# Patient Record
Sex: Male | Born: 1960 | Race: White | Hispanic: No | Marital: Single | Smoking: Current every day smoker
Health system: Southern US, Community
[De-identification: ages and names within clinical notes are randomized; demographics above are authoritative.]

## PROBLEM LIST (undated history)

## (undated) DIAGNOSIS — F1010 Alcohol abuse, uncomplicated: Secondary | ICD-10-CM

## (undated) DIAGNOSIS — J449 Chronic obstructive pulmonary disease, unspecified: Secondary | ICD-10-CM

## (undated) DIAGNOSIS — Z9049 Acquired absence of other specified parts of digestive tract: Secondary | ICD-10-CM

## (undated) DIAGNOSIS — F17200 Nicotine dependence, unspecified, uncomplicated: Secondary | ICD-10-CM

## (undated) DIAGNOSIS — I10 Essential (primary) hypertension: Secondary | ICD-10-CM

## (undated) DIAGNOSIS — E785 Hyperlipidemia, unspecified: Secondary | ICD-10-CM

## (undated) DIAGNOSIS — E119 Type 2 diabetes mellitus without complications: Secondary | ICD-10-CM

## (undated) DIAGNOSIS — K5792 Diverticulitis of intestine, part unspecified, without perforation or abscess without bleeding: Secondary | ICD-10-CM

## (undated) DIAGNOSIS — I82409 Acute embolism and thrombosis of unspecified deep veins of unspecified lower extremity: Secondary | ICD-10-CM

## (undated) HISTORY — PX: COLOSTOMY: SHX63

## (undated) HISTORY — PX: TONSILLECTOMY: SUR1361

---

## 2009-12-25 ENCOUNTER — Emergency Department (HOSPITAL_COMMUNITY): Admission: AC | Admit: 2009-12-25 | Discharge: 2009-12-25 | Payer: Self-pay

## 2009-12-30 ENCOUNTER — Emergency Department (HOSPITAL_COMMUNITY): Admission: EM | Admit: 2009-12-30 | Discharge: 2009-12-30 | Payer: Self-pay | Admitting: Emergency Medicine

## 2010-05-25 ENCOUNTER — Encounter (INDEPENDENT_AMBULATORY_CARE_PROVIDER_SITE_OTHER): Payer: Self-pay | Admitting: Family Medicine

## 2010-05-25 LAB — CONVERTED CEMR LAB
ALT: 34 units/L (ref 0–53)
AST: 32 units/L (ref 0–37)
Albumin: 5 g/dL (ref 3.5–5.2)
Alkaline Phosphatase: 99 units/L (ref 39–117)
BUN: 16 mg/dL (ref 6–23)
CO2: 25 meq/L (ref 19–32)
Calcium: 9.7 mg/dL (ref 8.4–10.5)
Chloride: 98 meq/L (ref 96–112)
Creatinine, Ser: 0.92 mg/dL (ref 0.40–1.50)
Glucose, Bld: 82 mg/dL (ref 70–99)
Potassium: 4.7 meq/L (ref 3.5–5.3)
Sodium: 137 meq/L (ref 135–145)
Total Bilirubin: 0.8 mg/dL (ref 0.3–1.2)
Total Protein: 8.1 g/dL (ref 6.0–8.3)

## 2010-08-28 LAB — COMPREHENSIVE METABOLIC PANEL
ALT: 25 U/L (ref 0–53)
Alkaline Phosphatase: 81 U/L (ref 39–117)
CO2: 25 mEq/L (ref 19–32)
Chloride: 104 mEq/L (ref 96–112)
GFR calc Af Amer: >60 mL/min (ref >60)
GFR calc non Af Amer: >60 mL/min (ref >60)
Potassium: 2.7 mEq/L — CL (ref 3.5–5.1)
Total Bilirubin: 0.5 mg/dL (ref 0.3–1.2)

## 2010-08-28 LAB — CBC
MCH: 34.8 pg — ABNORMAL HIGH (ref 26.0–34.0)
RBC: 4 MIL/uL — ABNORMAL LOW (ref 4.22–5.81)

## 2010-08-28 LAB — PROTIME-INR: INR: 0.91 (ref 0.00–1.49)

## 2010-08-28 LAB — ETHANOL: Alcohol, Ethyl (B): 260 mg/dL — ABNORMAL HIGH (ref 0–10)

## 2017-03-28 ENCOUNTER — Ambulatory Visit: Payer: Self-pay | Admitting: Registered"

## 2019-08-02 ENCOUNTER — Inpatient Hospital Stay (HOSPITAL_COMMUNITY)
Admission: EM | Admit: 2019-08-02 | Discharge: 2019-08-04 | DRG: 372 | Disposition: A | Discharge status: Home Health | Payer: Self-pay | Attending: Internal Medicine | Admitting: Internal Medicine

## 2019-08-02 ENCOUNTER — Emergency Department (HOSPITAL_COMMUNITY): Payer: Self-pay

## 2019-08-02 ENCOUNTER — Encounter (HOSPITAL_COMMUNITY): Payer: Self-pay

## 2019-08-02 ENCOUNTER — Other Ambulatory Visit: Payer: Self-pay

## 2019-08-02 DIAGNOSIS — A419 Sepsis, unspecified organism: Secondary | ICD-10-CM | POA: Diagnosis present

## 2019-08-02 DIAGNOSIS — K651 Peritoneal abscess: Principal | ICD-10-CM | POA: Diagnosis present

## 2019-08-02 DIAGNOSIS — Z86718 Personal history of other venous thrombosis and embolism: Secondary | ICD-10-CM

## 2019-08-02 DIAGNOSIS — Z79899 Other long term (current) drug therapy: Secondary | ICD-10-CM

## 2019-08-02 DIAGNOSIS — Z932 Ileostomy status: Secondary | ICD-10-CM

## 2019-08-02 DIAGNOSIS — I959 Hypotension, unspecified: Secondary | ICD-10-CM | POA: Diagnosis present

## 2019-08-02 DIAGNOSIS — Z9049 Acquired absence of other specified parts of digestive tract: Secondary | ICD-10-CM

## 2019-08-02 DIAGNOSIS — I10 Essential (primary) hypertension: Secondary | ICD-10-CM | POA: Diagnosis present

## 2019-08-02 DIAGNOSIS — L0291 Cutaneous abscess, unspecified: Secondary | ICD-10-CM

## 2019-08-02 DIAGNOSIS — Z7984 Long term (current) use of oral hypoglycemic drugs: Secondary | ICD-10-CM

## 2019-08-02 DIAGNOSIS — K632 Fistula of intestine: Secondary | ICD-10-CM | POA: Diagnosis present

## 2019-08-02 DIAGNOSIS — Z885 Allergy status to narcotic agent status: Secondary | ICD-10-CM

## 2019-08-02 DIAGNOSIS — Z20822 Contact with and (suspected) exposure to covid-19: Secondary | ICD-10-CM | POA: Diagnosis present

## 2019-08-02 DIAGNOSIS — E119 Type 2 diabetes mellitus without complications: Secondary | ICD-10-CM

## 2019-08-02 DIAGNOSIS — Z933 Colostomy status: Secondary | ICD-10-CM

## 2019-08-02 DIAGNOSIS — Z7901 Long term (current) use of anticoagulants: Secondary | ICD-10-CM

## 2019-08-02 DIAGNOSIS — E1169 Type 2 diabetes mellitus with other specified complication: Secondary | ICD-10-CM

## 2019-08-02 HISTORY — DX: Type 2 diabetes mellitus without complications: E11.9

## 2019-08-02 HISTORY — DX: Essential (primary) hypertension: I10

## 2019-08-02 HISTORY — DX: Acute embolism and thrombosis of unspecified deep veins of unspecified lower extremity: I82.409

## 2019-08-02 LAB — CBC WITH DIFFERENTIAL/PLATELET
Abs Immature Granulocytes: 0.17 10*3/uL — ABNORMAL HIGH (ref 0.00–0.07)
Basophils Absolute: 0.1 10*3/uL (ref 0.0–0.1)
Basophils Relative: 0 %
Eosinophils Absolute: 0.1 10*3/uL (ref 0.0–0.5)
Eosinophils Relative: 1 %
HCT: 33.4 % — ABNORMAL LOW (ref 39.0–52.0)
Hemoglobin: 10.7 g/dL — ABNORMAL LOW (ref 13.0–17.0)
Immature Granulocytes: 1 %
Lymphocytes Relative: 13 %
Lymphs Abs: 2.4 10*3/uL (ref 0.7–4.0)
MCH: 29.6 pg (ref 26.0–34.0)
MCHC: 32 g/dL (ref 30.0–36.0)
MCV: 92.3 fL (ref 80.0–100.0)
Monocytes Absolute: 1.9 10*3/uL — ABNORMAL HIGH (ref 0.1–1.0)
Monocytes Relative: 10 %
Neutro Abs: 14.5 10*3/uL — ABNORMAL HIGH (ref 1.7–7.7)
Neutrophils Relative %: 75 %
Platelets: 395 10*3/uL (ref 150–400)
RBC: 3.62 MIL/uL — ABNORMAL LOW (ref 4.22–5.81)
RDW: 14.6 % (ref 11.5–15.5)
WBC: 19.1 10*3/uL — ABNORMAL HIGH (ref 4.0–10.5)
nRBC: 0 % (ref 0.0–0.2)

## 2019-08-02 LAB — COMPREHENSIVE METABOLIC PANEL
ALT: 13 U/L (ref 0–44)
AST: 14 U/L — ABNORMAL LOW (ref 15–41)
Albumin: 3.5 g/dL (ref 3.5–5.0)
Alkaline Phosphatase: 99 U/L (ref 38–126)
Anion gap: 17 — ABNORMAL HIGH (ref 5–15)
BUN: 28 mg/dL — ABNORMAL HIGH (ref 6–20)
CO2: 20 mmol/L — ABNORMAL LOW (ref 22–32)
Calcium: 9.4 mg/dL (ref 8.9–10.3)
Chloride: 98 mmol/L (ref 98–111)
Creatinine, Ser: 1.28 mg/dL — ABNORMAL HIGH (ref 0.61–1.24)
GFR calc Af Amer: >60 mL/min (ref >60)
GFR calc non Af Amer: >60 mL/min (ref >60)
Glucose, Bld: 103 mg/dL — ABNORMAL HIGH (ref 70–99)
Potassium: 3.5 mmol/L (ref 3.5–5.1)
Sodium: 135 mmol/L (ref 135–145)
Total Bilirubin: 0.7 mg/dL (ref 0.3–1.2)
Total Protein: 8 g/dL (ref 6.5–8.1)

## 2019-08-02 LAB — URINALYSIS, ROUTINE W REFLEX MICROSCOPIC
Bilirubin Urine: NEGATIVE
Glucose, UA: NEGATIVE mg/dL
Hgb urine dipstick: NEGATIVE
Ketones, ur: NEGATIVE mg/dL
Leukocytes,Ua: NEGATIVE
Nitrite: NEGATIVE
Protein, ur: NEGATIVE mg/dL
Specific Gravity, Urine: 1.024 (ref 1.005–1.030)
pH: 5 (ref 5.0–8.0)

## 2019-08-02 LAB — RESPIRATORY PANEL BY RT PCR (FLU A&B, COVID)
Influenza A by PCR: NEGATIVE
Influenza B by PCR: NEGATIVE
SARS Coronavirus 2 by RT PCR: NEGATIVE

## 2019-08-02 LAB — PROTIME-INR
INR: 1.2 (ref 0.8–1.2)
Prothrombin Time: 15.3 seconds — ABNORMAL HIGH (ref 11.4–15.2)

## 2019-08-02 LAB — APTT: aPTT: 38 seconds — ABNORMAL HIGH (ref 24–36)

## 2019-08-02 LAB — LACTIC ACID, PLASMA: Lactic Acid, Venous: 5.1 mmol/L (ref 0.5–1.9)

## 2019-08-02 MED ORDER — VANCOMYCIN HCL 2000 MG/400ML IV SOLN
2000.0000 mg | Freq: Once | INTRAVENOUS | Status: AC
  Administered 2019-08-02: 2000 mg via INTRAVENOUS
  Filled 2019-08-02: qty 400

## 2019-08-02 MED ORDER — SODIUM CHLORIDE 0.9% FLUSH
3.0000 mL | Freq: Once | INTRAVENOUS | Status: AC
  Administered 2019-08-02: 3 mL via INTRAVENOUS

## 2019-08-02 MED ORDER — PIPERACILLIN-TAZOBACTAM 3.375 G IVPB
3.3750 g | Freq: Three times a day (TID) | INTRAVENOUS | Status: DC
  Administered 2019-08-03 – 2019-08-04 (×4): 3.375 g via INTRAVENOUS
  Filled 2019-08-02 (×4): qty 50

## 2019-08-02 MED ORDER — LACTATED RINGERS IV SOLN: INTRAVENOUS | Status: DC

## 2019-08-02 MED ORDER — LACTATED RINGERS IV BOLUS
1750.0000 mL | Freq: Once | INTRAVENOUS | Status: AC
  Administered 2019-08-02: 1750 mL via INTRAVENOUS

## 2019-08-02 MED ORDER — PIPERACILLIN-TAZOBACTAM 3.375 G IVPB 30 MIN
3.3750 g | Freq: Once | INTRAVENOUS | Status: AC
  Administered 2019-08-02: 3.375 g via INTRAVENOUS
  Filled 2019-08-02: qty 50

## 2019-08-02 MED ORDER — LACTATED RINGERS IV BOLUS: 2750.0000 mL | Freq: Once | INTRAVENOUS | Status: DC

## 2019-08-02 MED ORDER — VANCOMYCIN HCL IN DEXTROSE 1-5 GM/200ML-% IV SOLN
1000.0000 mg | Freq: Two times a day (BID) | INTRAVENOUS | Status: DC
  Administered 2019-08-03 (×2): 1000 mg via INTRAVENOUS
  Filled 2019-08-02 (×4): qty 200

## 2019-08-02 MED ORDER — IOHEXOL 300 MG/ML  SOLN
100.0000 mL | Freq: Once | INTRAMUSCULAR | Status: AC | PRN
  Administered 2019-08-02: 100 mL via INTRAVENOUS

## 2019-08-02 MED ORDER — LACTATED RINGERS IV BOLUS
1000.0000 mL | Freq: Once | INTRAVENOUS | Status: AC
  Administered 2019-08-02: 1000 mL via INTRAVENOUS

## 2019-08-02 NOTE — ED Triage Notes (Signed)
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

## 2019-08-02 NOTE — ED Provider Notes (Signed)
[facility] EMERGENCY DEPARTMENT Provider Note   CSN: [id number] Arrival date & time: 08/02/19  1857     History Chief Complaint  Patient presents with  . Wound Check    Luis [name] is a 59 y.o. male.  Patient with history of diabetes, hypertension who presents to the ED with abdominal pain, purulent drainage from abdominal wound.  Patient recently has been incarcerated in [facility].  During that time he had diverticulitis with perforation and had ostomy placed.  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  He finished a course of antibiotics just prior to release from prison last week.  He does not know when his follow-up with surgeons is in place.  He lives in [place] and prefers to have doctors locally.  Over the last several days he has noticed increased purulent drainage from fistula area on his left lower abdomen.  He has been doing his own wound dressings.  He has not noticed any fevers but has had some discomfort.  He did have blood clots while he was admitted to the hospital in [place] and is on Eliquis.  The history is provided by the patient.  Abdominal Pain Pain location:  Generalized Pain quality: aching   Pain radiates to:  Does not radiate Pain severity:  Mild Onset quality:  Gradual Timing:  Constant Progression:  Worsening Chronicity:  New Context: previous surgery   Relieved by:  Nothing Worsened by:  Nothing Associated symptoms: no anorexia, no belching, no chest pain, no chills, no constipation, no cough, no dysuria, no fever, no hematuria, no nausea, no shortness of breath, no sore throat and no vomiting   Risk factors: multiple surgeries        Past Medical History:  Diagnosis Date  . Diabetes mellitus without complication (HCC)   . Hypertension     There are no problems to display for this patient.   Past Surgical History:  Procedure Laterality Date  . COLOSTOMY    .  TONSILLECTOMY         No family history on file.  Social History   Tobacco Use  . Smoking status: Not on file  Substance Use Topics  . Alcohol use: Not on file  . Drug use: Not on file    Home Medications Prior to Admission medications   Not on File    Allergies    Patient has no allergy information on record.  Review of Systems   Review of Systems  Constitutional: Negative for chills and fever.  HENT: Negative for ear pain and sore throat.   Eyes: Negative for pain and visual disturbance.  Respiratory: Negative for cough and shortness of breath.   Cardiovascular: Negative for chest pain and palpitations.  Gastrointestinal: Positive for abdominal pain. Negative for anorexia, constipation, nausea and vomiting.  Genitourinary: Negative for dysuria and hematuria.  Musculoskeletal: Negative for arthralgias and back pain.  Skin: Positive for wound. Negative for color change and rash.  Neurological: Negative for seizures and syncope.  All other systems reviewed and are negative.   Physical Exam Updated Vital Signs  ED Triage Vitals  Enc Vitals Group     BP 08/02/19 1901 (!) 87/53     Pulse Rate 08/02/19 1901 (!) 58     Resp 08/02/19 1901 18     Temp 08/02/19 1901 98 F (36.7 C)     Temp Source 08/02/19 1901 Oral     SpO2 08/02/19 1901 97 %  Weight 08/02/19 2101 195 lb (88.5 kg)     Height 08/02/19 2101 6\' 1"  (1.854 m)     Head Circumference --      Peak Flow --      Pain Score 08/02/19 1906 10     Pain Loc --      Pain Edu? --      Excl. in [place]? --     Physical Exam Vitals and nursing note reviewed.  Constitutional:      General: He is not in acute distress.    Appearance: He is well-developed. He is not ill-appearing.  HENT:     Head: Normocephalic and atraumatic.     Nose: Nose normal.     Mouth/Throat:     Mouth: Mucous membranes are moist.  Eyes:     Extraocular Movements: Extraocular movements intact.     Conjunctiva/sclera: Conjunctivae  normal.     Pupils: Pupils are equal, round, and reactive to light.  Cardiovascular:     Rate and Rhythm: Normal rate and regular rhythm.     Heart sounds: No murmur.  Pulmonary:     Effort: Pulmonary effort is normal. No respiratory distress.     Breath sounds: Normal breath sounds.  Abdominal:     Palpations: Abdomen is soft.     Tenderness: There is abdominal tenderness.     Comments: Central abdominal surgical scar with granulation tissue, left lower quadrant area of packing with purulent drainage from hole, right-sided ostomy with brown stool  Musculoskeletal:        General: Normal range of motion.     Cervical back: Normal range of motion and neck supple.  Skin:    General: Skin is warm and dry.     Capillary Refill: Capillary refill takes less than 2 seconds.  Neurological:     General: No focal deficit present.     Mental Status: He is alert.  Psychiatric:        Mood and Affect: Mood normal.     ED Results / Procedures / Treatments   Labs (all labs ordered are listed, but only abnormal results are displayed) Labs Reviewed  LACTIC ACID, PLASMA - Abnormal; Notable for the following components:      Result Value   Lactic Acid, Venous 5.1 (*)    All other components within normal limits  COMPREHENSIVE METABOLIC PANEL - Abnormal; Notable for the following components:   CO2 20 (*)    Glucose, Bld 103 (*)    BUN 28 (*)    Creatinine, Ser 1.28 (*)    AST 14 (*)    Anion gap 17 (*)    All other components within normal limits  CBC WITH DIFFERENTIAL/PLATELET - Abnormal; Notable for the following components:   WBC 19.1 (*)    RBC 3.62 (*)    Hemoglobin 10.7 (*)    HCT 33.4 (*)    Neutro Abs 14.5 (*)    Monocytes Absolute 1.9 (*)    Abs Immature Granulocytes 0.17 (*)    All other components within normal limits  RESPIRATORY PANEL BY RT PCR (FLU A&B, COVID)  CULTURE, BLOOD (ROUTINE X 2)  CULTURE, BLOOD (ROUTINE X 2)  URINE CULTURE  URINALYSIS, ROUTINE W REFLEX  MICROSCOPIC  LACTIC ACID, PLASMA  APTT  PROTIME-INR    EKG None  Radiology CT ABDOMEN PELVIS W CONTRAST  Result Date: 08/02/2019 CLINICAL DATA:  Abdominal abscess suspected. Recent abdominal surgery. EXAM: CT ABDOMEN AND PELVIS WITH CONTRAST TECHNIQUE: Multidetector CT  imaging of the abdomen and pelvis was performed using the standard protocol following bolus administration of intravenous contrast. CONTRAST:  OMNIPAQUE IOHEXOL 300 MG/ML  SOLN COMPARISON:  December 25, 2009.  January 22, 2019. FINDINGS: Lower chest: The lung bases are clear. The heart size is normal. Hepatobiliary: The liver is normal. Normal gallbladder.There is no biliary ductal dilation. Pancreas: Normal contours without ductal dilatation. No peripancreatic fluid collection. Spleen: No splenic laceration or hematoma. Adrenals/Urinary Tract: --Adrenal glands: No adrenal hemorrhage. --Right kidney/ureter: No hydronephrosis or perinephric hematoma. --Left kidney/ureter: No hydronephrosis or perinephric hematoma. --Urinary bladder: Unremarkable. Stomach/Bowel: --Stomach/Duodenum: No hiatal hernia or other gastric abnormality. Normal duodenal course and caliber. --Small bowel: There is no evidence for small bowel obstruction. There are multiple enteroperitoneal adhesions to the anterior abdominal wall. 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 --Colon: The patient is status post total colectomy. --Appendix: Surgically absent. Vascular/Lymphatic: Atherosclerotic calcification is present within the non-aneurysmal abdominal aorta, without hemodynamically significant stenosis. --No retroperitoneal lymphadenopathy. --No mesenteric lymphadenopathy. --No pelvic or inguinal lymphadenopathy. Reproductive: Unremarkable Other: No ascites or free air. Th there is a curvilinear abscess in the left hemiabdomen measuring up to approximately 4.1 by 1.3 cm. This abscess extends inferiorly towards the left pericolic gutter and superiorly to terminate below  the greater curvature of the stomach. In the left upper quadrant. There is significant fat stranding with likely areas of fat necrosis. Musculoskeletal. No acute displaced fractures. IMPRESSION: 1. There is a curvilinear at least 4.5 cm abscess in the left abdomen as detailed above. 2. The patient is status post total colectomy. There is fat stranding in the left upper quadrant, likely postsurgical in etiology with areas of developing fat necrosis. 3. Patent right lower quadrant ileostomy without evidence for a bowel obstruction. Electronically Signed   By: Katherine Mantle M.D.   On: 08/02/2019 22:18   DG Chest Port 1 View  Result Date: 08/02/2019 CLINICAL DATA:  Sepsis. EXAM: PORTABLE CHEST 1 VIEW COMPARISON:  December 25, 2009 FINDINGS: The heart size and mediastinal contours are within normal limits. Both lungs are clear. The visualized skeletal structures are unremarkable. Aortic calcifications are noted. IMPRESSION: No active disease. Electronically Signed   By: Katherine Mantle M.D.   On: 08/02/2019 21:16    Procedures .Critical Care Performed by: Virgina Norfolk, DO Authorized by: Virgina Norfolk, DO   Critical care provider statement:    Critical care time (minutes):  45   Critical care was necessary to treat or prevent imminent or life-threatening deterioration of the following conditions:  Sepsis   Critical care was time spent personally by me on the following activities:  Blood draw for specimens, development of treatment plan with patient or surrogate, discussions with consultants, discussions with primary provider, evaluation of patient's response to treatment, examination of patient, obtaining history from patient or surrogate, ordering and performing treatments and interventions, ordering and review of laboratory studies, ordering and review of radiographic studies, pulse oximetry, re-evaluation of patient's condition and review of old charts   I assumed direction of critical care for  this patient from another provider in my specialty: no     (including critical care time)  Medications Ordered in ED Medications  vancomycin (VANCOREADY) IVPB 2000 mg/400 mL (2,000 mg Intravenous New Bag/Given 08/02/19 2223)  vancomycin (VANCOCIN) IVPB 1000 mg/200 mL premix (has no administration in time range)  piperacillin-tazobactam (ZOSYN) IVPB 3.375 g (has no administration in time range)  lactated ringers infusion ( Intravenous New Bag/Given 08/02/19 2249)  sodium chloride  flush (NS) 0.9 % injection 3 mL (3 mLs Intravenous Given 08/02/19 2051)  lactated ringers bolus 1,000 mL (0 mLs Intravenous Stopped 08/02/19 2224)  lactated ringers bolus 1,750 mL (0 mLs Intravenous Stopped 08/02/19 2244)  piperacillin-tazobactam (ZOSYN) IVPB 3.375 g (0 g Intravenous Stopped 08/02/19 2156)  iohexol (OMNIPAQUE) 300 MG/ML solution 100 mL (100 mLs Intravenous Contrast Given 08/02/19 2155)    ED Course  I have reviewed the triage vital signs and the nursing notes.  Pertinent labs & imaging results that were available during my care of the patient were reviewed by me and considered in my medical decision making (see chart for details).    MDM Rules/Calculators/A&P                      Luis [name] is a 59 year old male with history of hypertension, diabetes, status post colectomy following perforated diverticulitis last July who presents to the ED with abdominal pain, drainage from chronic left lower quadrant abdominal wound.  Patient with hypotension upon arrival and tachycardia.  However no fever.  Patient had multiple surgeries and extensive care in [facility] while he was incarcerated.  However he was released from prison last week and now lives in [place].  Does not have insurance or access to medical care.  He does not plan on following up with his surgeons at this time.  Patient noticed increasing drainage from his lower abdominal wound area.  It was foul-smelling.  Patient in triage  had initial blood pressure of 87/53 but otherwise had normal vitals.  Blood pressure was rechecked 8 minutes later and patient was 93/65 and therefore was not bedded right away.  He had basic lab work sent.  CBC was found to have a white count of 19 and patient a lactic acid of 5.1.  Therefore patient was reevaluated in triage and found to have a blood pressure of 83/65 and was tachycardic.  At that time he was brought back to the emergency department and was made a code sepsis.  Patient was empirically given IV antibiotics.  Sepsis labs were ordered and patient was given 30 cc/kg IV fluids.  CT scan of the abdomen and pelvis also has been ordered.  Patient on reassessment has normal blood pressure.  Good capillary refill.  Improved tachycardia.  It appears that fluid resuscitation has helped patient.  No signs of shock.  Urinalysis negative for infection.  Covid test is negative.  Influenza test is negative.  CT scan shows a 4.5 cm abscess in the left abdomen.  Otherwise unremarkable imaging.  Talked with Dr. [name] with general surgery who will evaluate the patient but believes that this will be amenable to IR drainage or aspiration.  Recommends admission to medicine as there is no acute surgical need at this time.    Patient admitted to medicine in stable condition.   Final Clinical Impression(s) / ED Diagnoses Final diagnoses:  Intra-abdominal abscess (HCC)  Sepsis, due to unspecified organism, unspecified whether acute organ dysfunction present [facility])    Rx / DC Orders ED Discharge Orders    None       Virgina Norfolk, DO 08/02/19 2254

## 2019-08-02 NOTE — Progress Notes (Signed)
Pharmacy Antibiotic Note  Luis [name] is a 59 y.o. male admitted on 08/02/2019 with sepsis, abdominal wound possibly fistula (completed 10d tx with augmentin a week ago).  Pharmacy has been consulted for vancomycin and zosyn dosing.  WBC 19.1, LA 5.1, AF, BP soft  Plan: Vancomycin 2000 mg IV x 1, then 1000 mg IV every 12 hours Goal AUC 400-550. Expected AUC: 500 SCr used: 1.28 Zosyn 3.375g IV every 8 hours (extended infusion) Monitor renal function, Cx and clinical progression to narrow Vancomycin levels at steady state  Weight: 195 lb (88.5 kg)  Temp (24hrs), Avg:98.3 F (36.8 C), Min:98 F (36.7 C), Max:98.5 F (36.9 C)  Recent Labs  Lab 08/02/19 1920  WBC 19.1*  CREATININE 1.28*  LATICACIDVEN 5.1*    CrCl cannot be calculated (Unknown ideal weight.).    Not on File  Antimicrobials this admission: Vanc 2/19>> Zosyn 2/19>>  Dose adjustments this admission:   Microbiology results: 2/19 BCx: sent 2/19 UCx: sent  Daylene Posey, PharmD Clinical Pharmacist Please check AMION for all [facility] Pharmacy numbers 08/02/2019 9:07 PM

## 2019-08-02 NOTE — Consult Note (Signed)
Reason for Consult:ab pain, drainage Referring Physician:Dr Curatolo  Luis [name] is an 59 y.o. male.  HPI: 27 yom with history of dm and htn who apparently was in prison in [place], [place]. His records are apparently here but they are out of room right now.  It appears he had TAC with ileostomy in July 2020.  He has had multiple wound infections and has recently been in hospital at [facility] three weeks ago for the same thing.  He is able to eat. His stoma is functional. He has no fevers. He has drainage from left side of abdomen that is brown and foul smelling.  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  He is a smoker.   He came in today for the drainage and concern due to that. hye had a ct scan that showed a left abdominal abscess   Past Medical History:  Diagnosis Date  . Diabetes mellitus without complication (HCC)   . Hypertension     Past Surgical History:  Procedure Laterality Date  . COLOSTOMY    . TONSILLECTOMY      No family history on file.  Social History:  has no history on file for tobacco, alcohol, and drug.  Allergies:  Allergies  Allergen Reactions  . Codeine Other (See Comments)    unknown    Medications: I have reviewed the patient's current medications.  Results for orders placed or performed during the hospital encounter of 08/02/19 (from the past 48 hour(s))  Lactic acid, plasma     Status: Abnormal   Collection Time: 08/02/19  7:20 PM  Result Value Ref Range   Lactic Acid, Venous 5.1 (HH) 0.5 - 1.9 mmol/L    Comment: CRITICAL RESULT CALLED TO, READ BACK BY AND VERIFIED WITH: Riki Altes RN AT 2010 ON [id number] BY Marveen Reeks Performed at [facility], [address] [place], [place] [address]   Comprehensive metabolic panel     Status: Abnormal   Collection Time: 08/02/19  7:20 PM  Result Value Ref Range   Sodium 135 135 - 145 mmol/L   Potassium 3.5 3.5 - 5.1 mmol/L   Chloride 98 98 - 111 mmol/L   CO2  20 (L) 22 - 32 mmol/L   Glucose, Bld 103 (H) 70 - 99 mg/dL   BUN 28 (H) 6 - 20 mg/dL   Creatinine, Ser 2.09 (H) 0.61 - 1.24 mg/dL   Calcium 9.4 8.9 - 47.0 mg/dL   Total Protein 8.0 6.5 - 8.1 g/dL   Albumin 3.5 3.5 - 5.0 g/dL   AST 14 (L) 15 - 41 U/L   ALT 13 0 - 44 U/L   Alkaline Phosphatase 99 38 - 126 U/L   Total Bilirubin 0.7 0.3 - 1.2 mg/dL   GFR calc non Af Amer >60 >60 mL/min   GFR calc Af Amer >60 >60 mL/min   Anion gap 17 (H) 5 - 15    Comment: Performed at [facility], [address] [place], [place] [address]  CBC with Differential     Status: Abnormal   Collection Time: 08/02/19  7:20 PM  Result Value Ref Range   WBC 19.1 (H) 4.0 - 10.5 K/uL   RBC 3.62 (L) 4.22 - 5.81 MIL/uL   Hemoglobin 10.7 (L) 13.0 - 17.0 g/dL   HCT 66.2 (L) 94.7 - 65.4 %   MCV 92.3 80.0 - 100.0 fL   MCH 29.6 26.0 -  34.0 pg   MCHC 32.0 30.0 - 36.0 g/dL   RDW 14.6 11.5 - 15.5 %   Platelets 395 150 - 400 K/uL   nRBC 0.0 0.0 - 0.2 %   Neutrophils Relative % 75 %   Neutro Abs 14.5 (H) 1.7 - 7.7 K/uL   Lymphocytes Relative 13 %   Lymphs Abs 2.4 0.7 - 4.0 K/uL   Monocytes Relative 10 %   Monocytes Absolute 1.9 (H) 0.1 - 1.0 K/uL   Eosinophils Relative 1 %   Eosinophils Absolute 0.1 0.0 - 0.5 K/uL   Basophils Relative 0 %   Basophils Absolute 0.1 0.0 - 0.1 K/uL   Immature Granulocytes 1 %   Abs Immature Granulocytes 0.17 (H) 0.00 - 0.07 K/uL    Comment: Performed at [facility] [address], [place], [place] [address]  Urinalysis, Routine w reflex microscopic     Status: None   Collection Time: 08/02/19  8:09 PM  Result Value Ref Range   Color, Urine YELLOW YELLOW   APPearance CLEAR CLEAR   Specific Gravity, Urine 1.024 1.005 - 1.030   pH 5.0 5.0 - 8.0   Glucose, UA NEGATIVE NEGATIVE mg/dL   Hgb urine dipstick NEGATIVE NEGATIVE   Bilirubin Urine NEGATIVE NEGATIVE   Ketones, ur NEGATIVE NEGATIVE mg/dL   Protein, ur NEGATIVE NEGATIVE mg/dL   Nitrite NEGATIVE NEGATIVE    Leukocytes,Ua NEGATIVE NEGATIVE    Comment: Performed at [facility] [address], [place], [place] [address]  APTT     Status: Abnormal   Collection Time: 08/02/19  9:13 PM  Result Value Ref Range   aPTT 38 (H) 24 - 36 seconds    Comment:        IF BASELINE aPTT IS ELEVATED, SUGGEST PATIENT RISK ASSESSMENT BE USED TO DETERMINE APPROPRIATE ANTICOAGULANT THERAPY. Performed at [facility], [place] [address], [place], [place] [address]   Protime-INR     Status: Abnormal   Collection Time: 08/02/19  9:13 PM  Result Value Ref Range   Prothrombin Time 15.3 (H) 11.4 - 15.2 seconds   INR 1.2 0.8 - 1.2    Comment: (NOTE) INR goal varies based on device and disease states. Performed at [facility], [place] [address] [place], [place] [address]   Respiratory Panel by RT PCR (Flu A&B, Covid) - Nasopharyngeal Swab     Status: None   Collection Time: 08/02/19  9:14 PM   Specimen: Nasopharyngeal Swab  Result Value Ref Range   SARS Coronavirus 2 by RT PCR NEGATIVE NEGATIVE    Comment: (NOTE) SARS-CoV-2 target nucleic acids are NOT DETECTED. The SARS-CoV-2 RNA is generally detectable in upper respiratoy specimens during the acute phase of infection. The lowest concentration of SARS-CoV-2 viral copies this assay can detect is 131 copies/mL. A negative result does not preclude SARS-Cov-2 infection and should not be used as the sole basis for treatment or other patient management decisions. A negative result may occur with  improper specimen collection/handling, submission of specimen other than nasopharyngeal swab, presence of viral mutation(s) within the areas targeted by this assay, and inadequate number of viral copies (<131 copies/mL). A negative result must be combined with clinical observations, patient history, and epidemiological information. The expected result is Negative. Fact Sheet for Patients:  PinkCheek.be Fact Sheet for  Healthcare Providers:  GravelBags.it This test is not yet ap proved or cleared by the [place] FDA and  has been authorized for detection and/or  diagnosis of SARS-CoV-2 by FDA under an Emergency Use Authorization (EUA). This EUA will remain  in effect (meaning this test can be used) for the duration of the COVID-19 declaration under Section 564(b)(1) of the Act, 21 U.S.C. section 360bbb-3(b)(1), unless the authorization is terminated or revoked sooner.    Influenza A by PCR NEGATIVE NEGATIVE   Influenza B by PCR NEGATIVE NEGATIVE    Comment: (NOTE) The Xpert Xpress SARS-CoV-2/FLU/RSV assay is intended as an aid in  the diagnosis of influenza from Nasopharyngeal swab specimens and  should not be used as a sole basis for treatment. Nasal washings and  aspirates are unacceptable for Xpert Xpress SARS-CoV-2/FLU/RSV  testing. Fact Sheet for Patients: https://www.moore.com/ Fact Sheet for Healthcare Providers: https://www.young.biz/ This test is not yet approved or cleared by the [place] FDA and  has been authorized for detection and/or diagnosis of SARS-CoV-2 by  FDA under an Emergency Use Authorization (EUA). This EUA will remain  in effect (meaning this test can be used) for the duration of the  Covid-19 declaration under Section 564(b)(1) of the Act, 21  U.S.C. section 360bbb-3(b)(1), unless the authorization is  terminated or revoked. Performed at [facility], [address], [place], [place] [address]     CT ABDOMEN PELVIS W CONTRAST  Result Date: 08/02/2019 CLINICAL DATA:  Abdominal abscess suspected. Recent abdominal surgery. EXAM: CT ABDOMEN AND PELVIS WITH CONTRAST TECHNIQUE: Multidetector CT imaging of the abdomen and pelvis was performed using the standard protocol following bolus administration of intravenous contrast. CONTRAST:  OMNIPAQUE IOHEXOL 300 MG/ML  SOLN COMPARISON:  December 25, 2009.  January 22, 2019. FINDINGS: Lower chest: The lung bases are clear. The heart size is normal. Hepatobiliary: The liver is normal. Normal gallbladder.There is no biliary ductal dilation. Pancreas: Normal contours without ductal dilatation. No peripancreatic fluid collection. Spleen: No splenic laceration or hematoma. Adrenals/Urinary Tract: --Adrenal glands: No adrenal hemorrhage. --Right kidney/ureter: No hydronephrosis or perinephric hematoma. --Left kidney/ureter: No hydronephrosis or perinephric hematoma. --Urinary bladder: Unremarkable. Stomach/Bowel: --Stomach/Duodenum: No hiatal hernia or other gastric abnormality. Normal duodenal course and caliber. --Small bowel: There is no evidence for small bowel obstruction. There are multiple enteroperitoneal adhesions to the anterior abdominal wall. 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 --Colon: The patient is status post total colectomy. --Appendix: Surgically absent. Vascular/Lymphatic: Atherosclerotic calcification is present within the non-aneurysmal abdominal aorta, without hemodynamically significant stenosis. --No retroperitoneal lymphadenopathy. --No mesenteric lymphadenopathy. --No pelvic or inguinal lymphadenopathy. Reproductive: Unremarkable Other: No ascites or free air. Th there is a curvilinear abscess in the left hemiabdomen measuring up to approximately 4.1 by 1.3 cm. This abscess extends inferiorly towards the left pericolic gutter and superiorly to terminate below the greater curvature of the stomach. In the left upper quadrant. There is significant fat stranding with likely areas of fat necrosis. Musculoskeletal. No acute displaced fractures. IMPRESSION: 1. There is a curvilinear at least 4.5 cm abscess in the left abdomen as detailed above. 2. The patient is status post total colectomy. There is fat stranding in the left upper quadrant, likely postsurgical in etiology with areas of developing fat necrosis. 3. Patent right lower quadrant  ileostomy without evidence for a bowel obstruction. Electronically Signed   By: Katherine Mantle M.D.   On: 08/02/2019 22:18   DG Chest Port 1 View  Result Date: 08/02/2019 CLINICAL DATA:  Sepsis. EXAM: PORTABLE CHEST 1 VIEW COMPARISON:  December 25, 2009 FINDINGS: The heart size and mediastinal contours are within normal limits. Both  lungs are clear. The visualized skeletal structures are unremarkable. Aortic calcifications are noted. IMPRESSION: No active disease. Electronically Signed   By: Katherine Mantle M.D.   On: 08/02/2019 21:16    Review of Systems  Constitutional: Negative for chills and fever.  Gastrointestinal: Positive for abdominal pain.  All other systems reviewed and are negative.  Blood pressure 108/65, pulse 92, temperature 98.5 F (36.9 C), temperature source Oral, resp. rate 17, height 6\' 1"  (1.854 m), weight 88.5 kg, SpO2 93 %. Physical Exam  Vitals reviewed. Constitutional: He is oriented to person, place, and time. He appears well-developed and well-nourished.  HENT:  Head: Normocephalic and atraumatic.  Right Ear: External ear normal.  Left Ear: External ear normal.  Eyes: No scleral icterus.  Cardiovascular: Normal rate and regular rhythm.  Respiratory: Effort normal and breath sounds normal.  GI: Soft. He exhibits no distension. Bowel sounds are decreased. There is no abdominal tenderness. A hernia is present. Hernia confirmed positive in the ventral area.    Musculoskeletal:     Cervical back: Neck supple.  Lymphadenopathy:    He has no cervical adenopathy.  Neurological: He is alert and oriented to person, place, and time.  Skin: Skin is warm and dry.  Psychiatric: He has a normal mood and affect. His behavior is normal.    Assessment/Plan: Intraadominal abscess, ? EC fistula -will ask IR to see him in am to see if drain can be placed -start zosyn  -leave npo for now -hold eliquis -once drained then will have to eventually figure out if ec  fistula present    08/02/2019, 11:59 PM

## 2019-08-03 ENCOUNTER — Other Ambulatory Visit: Payer: Self-pay

## 2019-08-03 ENCOUNTER — Encounter (HOSPITAL_COMMUNITY): Payer: Self-pay | Admitting: Internal Medicine

## 2019-08-03 DIAGNOSIS — Z86718 Personal history of other venous thrombosis and embolism: Secondary | ICD-10-CM

## 2019-08-03 DIAGNOSIS — E119 Type 2 diabetes mellitus without complications: Secondary | ICD-10-CM

## 2019-08-03 DIAGNOSIS — I959 Hypotension, unspecified: Secondary | ICD-10-CM

## 2019-08-03 DIAGNOSIS — L0291 Cutaneous abscess, unspecified: Secondary | ICD-10-CM

## 2019-08-03 DIAGNOSIS — E1169 Type 2 diabetes mellitus with other specified complication: Secondary | ICD-10-CM

## 2019-08-03 DIAGNOSIS — A419 Sepsis, unspecified organism: Secondary | ICD-10-CM | POA: Diagnosis present

## 2019-08-03 DIAGNOSIS — K651 Peritoneal abscess: Secondary | ICD-10-CM | POA: Diagnosis present

## 2019-08-03 HISTORY — DX: Sepsis, unspecified organism: A41.9

## 2019-08-03 HISTORY — DX: Hypotension, unspecified: I95.9

## 2019-08-03 LAB — GLUCOSE, CAPILLARY
Glucose-Capillary: 104 mg/dL — ABNORMAL HIGH (ref 70–99)
Glucose-Capillary: 108 mg/dL — ABNORMAL HIGH (ref 70–99)
Glucose-Capillary: 91 mg/dL (ref 70–99)
Glucose-Capillary: 95 mg/dL (ref 70–99)
Glucose-Capillary: 98 mg/dL (ref 70–99)
Glucose-Capillary: 98 mg/dL (ref 70–99)

## 2019-08-03 LAB — COMPREHENSIVE METABOLIC PANEL
ALT: 11 U/L (ref 0–44)
AST: 10 U/L — ABNORMAL LOW (ref 15–41)
Albumin: 2.7 g/dL — ABNORMAL LOW (ref 3.5–5.0)
Alkaline Phosphatase: 84 U/L (ref 38–126)
Anion gap: 11 (ref 5–15)
BUN: 21 mg/dL — ABNORMAL HIGH (ref 6–20)
CO2: 23 mmol/L (ref 22–32)
Calcium: 8.7 mg/dL — ABNORMAL LOW (ref 8.9–10.3)
Chloride: 102 mmol/L (ref 98–111)
Creatinine, Ser: 1.08 mg/dL (ref 0.61–1.24)
GFR calc Af Amer: >60 mL/min (ref >60)
GFR calc non Af Amer: >60 mL/min (ref >60)
Glucose, Bld: 111 mg/dL — ABNORMAL HIGH (ref 70–99)
Potassium: 3.3 mmol/L — ABNORMAL LOW (ref 3.5–5.1)
Sodium: 136 mmol/L (ref 135–145)
Total Bilirubin: 0.7 mg/dL (ref 0.3–1.2)
Total Protein: 6.5 g/dL (ref 6.5–8.1)

## 2019-08-03 LAB — HEMOGLOBIN A1C
Hgb A1c MFr Bld: 6.3 % — ABNORMAL HIGH (ref 4.8–5.6)
Mean Plasma Glucose: 134.11 mg/dL

## 2019-08-03 LAB — CBC
HCT: 29.2 % — ABNORMAL LOW (ref 39.0–52.0)
Hemoglobin: 9.2 g/dL — ABNORMAL LOW (ref 13.0–17.0)
MCH: 29.2 pg (ref 26.0–34.0)
MCHC: 31.5 g/dL (ref 30.0–36.0)
MCV: 92.7 fL (ref 80.0–100.0)
Platelets: 265 10*3/uL (ref 150–400)
RBC: 3.15 MIL/uL — ABNORMAL LOW (ref 4.22–5.81)
RDW: 14.7 % (ref 11.5–15.5)
WBC: 10.6 10*3/uL — ABNORMAL HIGH (ref 4.0–10.5)
nRBC: 0 % (ref 0.0–0.2)

## 2019-08-03 LAB — LACTIC ACID, PLASMA
Lactic Acid, Venous: 5.7 mmol/L (ref 0.5–1.9)
Lactic Acid, Venous: 1 mmol/L (ref 0.5–1.9)
Lactic Acid, Venous: 0.9 mmol/L (ref 0.5–1.9)

## 2019-08-03 LAB — SURGICAL PCR SCREEN
MRSA, PCR: POSITIVE — AB
Staphylococcus aureus: POSITIVE — AB

## 2019-08-03 LAB — HIV ANTIBODY (ROUTINE TESTING W REFLEX): HIV Screen 4th Generation wRfx: NONREACTIVE

## 2019-08-03 LAB — CBG MONITORING, ED: Glucose-Capillary: 109 mg/dL — ABNORMAL HIGH (ref 70–99)

## 2019-08-03 MED ORDER — ONDANSETRON HCL 4 MG PO TABS: 4.0000 mg | ORAL_TABLET | Freq: Four times a day (QID) | ORAL | Status: DC | PRN

## 2019-08-03 MED ORDER — ATORVASTATIN CALCIUM 10 MG PO TABS
20.0000 mg | ORAL_TABLET | Freq: Every day | ORAL | Status: DC
  Administered 2019-08-03 – 2019-08-04 (×2): 20 mg via ORAL
  Filled 2019-08-03 (×2): qty 2

## 2019-08-03 MED ORDER — HEPARIN (PORCINE) 25000 UT/250ML-% IV SOLN: 1300.0000 [IU]/h | INTRAVENOUS | Status: DC

## 2019-08-03 MED ORDER — ACETAMINOPHEN 325 MG PO TABS: 650.0000 mg | ORAL_TABLET | Freq: Four times a day (QID) | ORAL | Status: DC | PRN

## 2019-08-03 MED ORDER — PANTOPRAZOLE SODIUM 40 MG PO TBEC
40.0000 mg | DELAYED_RELEASE_TABLET | Freq: Every day | ORAL | Status: DC
  Administered 2019-08-03 – 2019-08-04 (×2): 40 mg via ORAL
  Filled 2019-08-03 (×2): qty 1

## 2019-08-03 MED ORDER — MUPIROCIN 2 % EX OINT
1.0000 "application " | TOPICAL_OINTMENT | Freq: Two times a day (BID) | CUTANEOUS | Status: DC
  Administered 2019-08-03 – 2019-08-04 (×3): 1 via NASAL
  Filled 2019-08-03: qty 22

## 2019-08-03 MED ORDER — INSULIN ASPART 100 UNIT/ML ~~LOC~~ SOLN
0.0000 [IU] | SUBCUTANEOUS | Status: DC
  Administered 2019-08-04: 1 [IU] via SUBCUTANEOUS

## 2019-08-03 MED ORDER — ONDANSETRON HCL 4 MG/2ML IJ SOLN: 4.0000 mg | Freq: Four times a day (QID) | INTRAMUSCULAR | Status: DC | PRN

## 2019-08-03 MED ORDER — POTASSIUM CHLORIDE 10 MEQ/100ML IV SOLN
10.0000 meq | INTRAVENOUS | Status: AC
  Administered 2019-08-03 (×3): 10 meq via INTRAVENOUS
  Filled 2019-08-03 (×3): qty 100

## 2019-08-03 MED ORDER — GABAPENTIN 300 MG PO CAPS
300.0000 mg | ORAL_CAPSULE | Freq: Two times a day (BID) | ORAL | Status: DC
  Administered 2019-08-03 – 2019-08-04 (×3): 300 mg via ORAL
  Filled 2019-08-03 (×3): qty 1

## 2019-08-03 MED ORDER — APIXABAN 5 MG PO TABS
5.0000 mg | ORAL_TABLET | Freq: Two times a day (BID) | ORAL | Status: DC
  Administered 2019-08-03 – 2019-08-04 (×2): 5 mg via ORAL
  Filled 2019-08-03 (×2): qty 1

## 2019-08-03 MED ORDER — LACTATED RINGERS IV SOLN: INTRAVENOUS | Status: DC

## 2019-08-03 MED ORDER — ACETAMINOPHEN 650 MG RE SUPP: 650.0000 mg | Freq: Four times a day (QID) | RECTAL | Status: DC | PRN

## 2019-08-03 NOTE — Progress Notes (Addendum)
ADDENDUM (0045) Heparin consult d/c as pt to IR in the morning. Will f/u plans for further [facility] post IR procedure.  ANTICOAGULATION CONSULT NOTE - Initial Consult  Pharmacy Consult for Heparin Indication: h/o DVTs  Allergies  Allergen Reactions  . Codeine Other (See Comments)    unknown    Patient Measurements: Height: 6\' 1"  (185.4 cm) Weight: 195 lb (88.5 kg) IBW/kg (Calculated) : 79.9  Vital Signs: Temp: 98.5 F (36.9 C) (02/19 2021) Temp Source: Oral (02/19 2021) BP: 107/66 (02/20 0000) Pulse Rate: 91 (02/20 0000)  Labs: Recent Labs    08/02/19 1920 08/02/19 2113  HGB 10.7*  --   HCT 33.4*  --   PLT 395  --   APTT  --  38*  LABPROT  --  15.3*  INR  --  1.2  CREATININE 1.28*  --     Estimated Creatinine Clearance: 70.2 mL/min (A) (by C-G formula based on SCr of 1.28 mg/dL (H)).   Medical History: Past Medical History:  Diagnosis Date  . Diabetes mellitus without complication (HCC)   . Hypertension     Medications:  See electronic med rec  Assessment: 59 y.o. M presents with intra-abd abscess. 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 Holding apixaban as IR may need to place drain. Last Eliquis dose 2/19 1600. Pharmacy to dose Heparin while Eliquis on hold. Eliquis will be affecting heparin levels so we will utilize PTT for monitoring until correlating.   Baseline PTT 38 sec. Hgb 10.7, plt wnl.  Goal of Therapy:  Heparin level 0.3-0.7 units/ml; PTT 66-102 sec Monitor platelets by anticoagulation protocol: Yes   Plan:  At 0400 (12 hrs past last Eliquis dose), start Heparin gtt at 1300 units/hr. No bolus. Will f/u heparin level and PTT in 8 hours Daily heparin level, PTT, and CBC  3/19, PharmD, BCPS Please see amion for complete clinical pharmacist phone list 08/03/2019,12:24 AM

## 2019-08-03 NOTE — ED Provider Notes (Signed)
Case discussed with Dr. Julian Reil with the hospitalist.  He admitted to the hospitalist  service.  General surgery has already been consulted   Zadie Rhine, MD 08/03/19 [phone]

## 2019-08-03 NOTE — Progress Notes (Signed)
Notified bedside nurse of need to draw lactic acid.  

## 2019-08-03 NOTE — Plan of Care (Signed)
This patient was admitted this morning by my partner he is 59 years old with history of diabetes and hypertension recently incarcerated with diverticulitis with perforation colectomy and ileostomy. General surgery and IR consulted for possible intra-abdominal abscess. IR notes recommending to repeat CT in few days to see if the fluid collection is amenable to drainage.  Continue Zosyn for now.

## 2019-08-03 NOTE — Progress Notes (Signed)
[facility] [place] [facility] Progress Note     Subjective: CC: drainage from abdominal wound Patient reports pain is slightly improved from admission time. Still having drainage from abdominal wound. Was at [facility] in [place] a few weeks ago. Just got out of prison recently. Wanting something to eat. Discussed plan for possible drain placement today.   Review of Systems  Respiratory: Negative for shortness of breath.   Cardiovascular: Negative for chest pain.  Gastrointestinal: Positive for abdominal pain. Negative for nausea and vomiting.  Genitourinary: Negative for dysuria, frequency and urgency.     Objective: Vital signs in last 24 hours: Temp:  [98 F (36.7 C)-99.2 F (37.3 C)] 99.2 F (37.3 C) (02/20 0730) Pulse Rate:  [58-108] 80 (02/20 0409) Resp:  [15-21] 15 (02/20 0409) BP: (83-118)/(53-76) 113/68 (02/20 0730) SpO2:  [90 %-100 %] 98 % (02/20 0409) Weight:  [86.4 kg-88.5 kg] 86.4 kg (02/20 0409) Last BM Date: 08/03/19  Intake/Output from previous day: No intake/output data recorded. Intake/Output this shift: Total I/O In: -  Out: 350 [Urine:350]  PE: General: pleasant, WD, WN white male who is laying in bed in NAD HEENT: Sclera are noninjected.  PERRL.  Ears and nose without any masses or lesions.  Mouth is pink and moist Heart: regular, rate, and rhythm.  Normal s1,s2. No obvious murmurs, gallops, or rubs noted.  Palpable radial and pedal pulses bilaterally Lungs: CTAB, no wheezes, rhonchi, or rales noted.  Respiratory effort nonlabored Abd: soft, NT, ND, +BS, stoma intact with stool in ostomy appliance, midline wound almost at skin level with granulation tissue, small opening lateral to midline with purulent appearing drainage. MS: all 4 extremities are symmetrical with no cyanosis, clubbing, or edema. Skin: warm and dry with no masses, lesions, or rashes Neuro: Cranial nerves 2-12 grossly intact, sensation intact Psych: A&Ox3 with an appropriate  affect.   Lab Results:  Recent Labs    08/02/19 1920 08/03/19 0544  WBC 19.1* 10.6*  HGB 10.7* 9.2*  HCT 33.4* 29.2*  PLT 395 265   BMET Recent Labs    08/02/19 1920 08/03/19 0544  NA 135 136  K 3.5 3.3*  CL 98 102  CO2 20* 23  GLUCOSE 103* 111*  BUN 28* 21*  CREATININE 1.28* 1.08  CALCIUM 9.4 8.7*   PT/INR Recent Labs    08/02/19 2113  LABPROT 15.3*  INR 1.2   CMP     Component Value Date/Time   NA 136 08/03/2019 0544   K 3.3 (L) 08/03/2019 0544   CL 102 08/03/2019 0544   CO2 23 08/03/2019 0544   GLUCOSE 111 (H) 08/03/2019 0544   BUN 21 (H) 08/03/2019 0544   CREATININE 1.08 08/03/2019 0544   CALCIUM 8.7 (L) 08/03/2019 0544   PROT 6.5 08/03/2019 0544   ALBUMIN 2.7 (L) 08/03/2019 0544   AST 10 (L) 08/03/2019 0544   ALT 11 08/03/2019 0544   ALKPHOS 84 08/03/2019 0544   BILITOT 0.7 08/03/2019 0544   GFRNONAA >60 08/03/2019 0544   GFRAA >60 08/03/2019 0544   Lipase  No results found for: LIPASE     Studies/Results: CT ABDOMEN PELVIS W CONTRAST  Result Date: 08/02/2019 CLINICAL DATA:  Abdominal abscess suspected. Recent abdominal surgery. EXAM: CT ABDOMEN AND PELVIS WITH CONTRAST TECHNIQUE: Multidetector CT imaging of the abdomen and pelvis was performed using the standard protocol following bolus administration of intravenous contrast. CONTRAST:  17mL OMNIPAQUE IOHEXOL 300 MG/ML  SOLN COMPARISON:  December 25, 2009.  January 22, 2019. FINDINGS: Lower  chest: The lung bases are clear. The heart size is normal. Hepatobiliary: The liver is normal. Normal gallbladder.There is no biliary ductal dilation. Pancreas: Normal contours without ductal dilatation. No peripancreatic fluid collection. Spleen: No splenic laceration or hematoma. Adrenals/Urinary Tract: --Adrenal glands: No adrenal hemorrhage. --Right kidney/ureter: No hydronephrosis or perinephric hematoma. --Left kidney/ureter: No hydronephrosis or perinephric hematoma. --Urinary bladder: Unremarkable.  Stomach/Bowel: --Stomach/Duodenum: No hiatal hernia or other gastric abnormality. Normal duodenal course and caliber. --Small bowel: There is no evidence for small bowel obstruction. There are multiple enteroperitoneal adhesions to the anterior abdominal wall. 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 --Colon: The patient is status post total colectomy. --Appendix: Surgically absent. Vascular/Lymphatic: Atherosclerotic calcification is present within the non-aneurysmal abdominal aorta, without hemodynamically significant stenosis. --No retroperitoneal lymphadenopathy. --No mesenteric lymphadenopathy. --No pelvic or inguinal lymphadenopathy. Reproductive: Unremarkable Other: No ascites or free air. Th there is a curvilinear abscess in the left hemiabdomen measuring up to approximately 4.1 by 1.3 cm. This abscess extends inferiorly towards the left pericolic gutter and superiorly to terminate below the greater curvature of the stomach. In the left upper quadrant. There is significant fat stranding with likely areas of fat necrosis. Musculoskeletal. No acute displaced fractures. IMPRESSION: 1. There is a curvilinear at least 4.5 cm abscess in the left abdomen as detailed above. 2. The patient is status post total colectomy. There is fat stranding in the left upper quadrant, likely postsurgical in etiology with areas of developing fat necrosis. 3. Patent right lower quadrant ileostomy without evidence for a bowel obstruction. Electronically Signed   By: Katherine Mantle M.D.   On: 08/02/2019 22:18   DG Chest Port 1 View  Result Date: 08/02/2019 CLINICAL DATA:  Sepsis. EXAM: PORTABLE CHEST 1 VIEW COMPARISON:  December 25, 2009 FINDINGS: The heart size and mediastinal contours are within normal limits. Both lungs are clear. The visualized skeletal structures are unremarkable. Aortic calcifications are noted. IMPRESSION: No active disease. Electronically Signed   By: Katherine Mantle M.D.   On: 08/02/2019 21:16     Anti-infectives: Anti-infectives (From admission, onward)   Start     Dose/Rate Route Frequency Ordered Stop   08/03/19 1000  vancomycin (VANCOCIN) IVPB 1000 mg/200 mL premix     1,000 mg 200 mL/hr over 60 Minutes Intravenous Every 12 hours 08/02/19 2120     08/03/19 0600  piperacillin-tazobactam (ZOSYN) IVPB 3.375 g     3.375 g 12.5 mL/hr over 240 Minutes Intravenous Every 8 hours 08/02/19 2120     08/02/19 2115  piperacillin-tazobactam (ZOSYN) IVPB 3.375 g     3.375 g 100 mL/hr over 30 Minutes Intravenous  Once 08/02/19 2109 08/02/19 2156   08/02/19 2115  vancomycin (VANCOREADY) IVPB 2000 mg/400 mL     2,000 mg 200 mL/hr over 120 Minutes Intravenous  Once 08/02/19 2109 08/03/19 0024       Assessment/Plan HTN Hx of DVT - eliquis on hold T2DM  Intra-abdominal abscess vs EC fistula - IR to consult for possible drain placement - once drain will need to better examine whether fistula is present  - continue IV abx - npo and hold eliquis until seen by IR - no indication for emergent surgical intervention at this time, we will follow with you  FEN: NPO, IVF VTE: SCDs ID: [email or web address] 2/19>>   LOS: 0 days    Wells Guiles , [facility] 08/03/2019, 8:41 AM Please see Amion for pager number during day hours 7:00am-4:30pm

## 2019-08-03 NOTE — H&P (Signed)
History and Physical    Luis [name] TKZ:[id number] DOB: 12-08-1960 DOA: 08/02/2019  PCP: Henrine Screws, MD  Patient coming from: Home  I have personally briefly reviewed patient's old medical records in [facility]  Chief Complaint: Abd pain, drainage  HPI: Luis [name] is a 59 y.o. male with medical history significant of DM, HTN.  Recently Incarcerated in [place], had diverticulitis with perforation, had colectomy and ileostomy.  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   He finished a course of antibiotics just prior to release from prison last week.  He does not know when his follow-up with surgeons is in place.  He lives in [place] and prefers to have doctors locally.  Over the last several days he has noticed increased purulent drainage from fistula area on his left lower abdomen.  He has been doing his own wound dressings.  He has not noticed any fevers but has had some discomfort.  He did have DVTs while he was admitted to the hospital in [place] and is on Eliquis.   ED Course: WBC 19k, foul smelling brown drainage from abdomen wound,   First lactate 5.1 and initial BP 83/65, got 30cc/kg bolus, BP improved to 101/62, but lactate now 5.7.  Started on zosyn / vanc  Hospitalist asked to admit.   Review of Systems: As per HPI, otherwise all review of systems negative.  Past Medical History:  Diagnosis Date  . Diabetes mellitus without complication (HCC)   . DVT (deep venous thrombosis) (HCC)   . Hypertension     Past Surgical History:  Procedure Laterality Date  . COLOSTOMY    . TONSILLECTOMY       has no history on file for tobacco, alcohol, and drug.  Allergies  Allergen Reactions  . Codeine Other (See Comments)    unknown    No family history on file. No reported sick contacts.  Prior to Admission medications   Medication Sig Start Date End Date Taking? Authorizing Provider  acetaminophen (TYLENOL) 650 MG CR  tablet Take 650 mg by mouth every 8 (eight) hours.   Yes [provider]  amLODipine (NORVASC) 10 MG tablet Take 10 mg by mouth daily.   Yes [provider]  apixaban (ELIQUIS) 5 MG TABS tablet Take 5 mg by mouth 2 (two) times daily.   Yes [provider]  atorvastatin (LIPITOR) 20 MG tablet Take 20 mg by mouth daily.   Yes [provider]  cholecalciferol (VITAMIN D3) 25 MCG (1000 UNIT) tablet Take 3,000 Units by mouth daily.   Yes [provider]  gabapentin (NEURONTIN) 300 MG capsule Take 300 mg by mouth 2 (two) times daily.   Yes [provider]  hydrochlorothiazide (MICROZIDE) 12.5 MG capsule Take 12.5 mg by mouth daily.   Yes [provider]  metFORMIN (GLUCOPHAGE) 1000 MG tablet Take 1,000 mg by mouth 2 (two) times daily with a meal.   Yes [provider]  Multiple Vitamin (MULTIVITAMIN WITH MINERALS) TABS tablet Take 1 tablet by mouth daily.   Yes [provider]  pantoprazole (PROTONIX) 40 MG tablet Take 40 mg by mouth daily.   Yes [provider]  tamsulosin (FLOMAX) 0.4 MG CAPS capsule Take 0.4 mg by mouth daily.   Yes [provider]  thiamine 100 MG tablet Take 100 mg by mouth daily.   Yes [provider]  vitamin B-12 (CYANOCOBALAMIN) 500 MCG tablet Take 500 mcg by mouth daily.   Yes [provider]  Physical Exam: Vitals:   08/02/19 2330 08/02/19 2345 08/03/19 0000 08/03/19 0015  BP: 114/64 108/65 107/66 101/62  Pulse: 89 92 91 87  Resp: 20 17 19 18   Temp:      TempSrc:      SpO2: 94% 93% 92% 92%  Weight:      Height:        Constitutional: NAD, calm, comfortable Eyes: PERRL, lids and conjunctivae normal ENMT: Mucous membranes are moist. Posterior pharynx clear of any exudate or lesions.Normal dentition.  Neck: normal, supple, no masses, no thyromegaly Respiratory: clear to auscultation bilaterally, no wheezing, no crackles. Normal respiratory effort.  No accessory muscle use.  Cardiovascular: Regular rate and rhythm, no murmurs / rubs / gallops. No extremity edema. 2+ pedal pulses. No carotid bruits.  Abdomen: Ventral hernia present, RLQ iliostomy present, draining wound with foul smelling brown drainage present. Musculoskeletal: no clubbing / cyanosis. No joint deformity upper and lower extremities. Good ROM, no contractures. Normal muscle tone.  Skin: no rashes, lesions, ulcers. No induration Neurologic: CN 2-12 grossly intact. Sensation intact, DTR normal. Strength 5/5 in all 4.  Psychiatric: Normal judgment and insight. Alert and oriented x 3. Normal mood.    Labs on Admission: I have personally reviewed following labs and imaging studies  CBC: Recent Labs  Lab 08/02/19 1920  WBC 19.1*  NEUTROABS 14.5*  HGB 10.7*  HCT 33.4*  MCV 92.3  PLT 395   Basic Metabolic Panel: Recent Labs  Lab 08/02/19 1920  NA 135  K 3.5  CL 98  CO2 20*  GLUCOSE 103*  BUN 28*  CREATININE 1.28*  CALCIUM 9.4   GFR: Estimated Creatinine Clearance: 70.2 mL/min (A) (by C-G formula based on SCr of 1.28 mg/dL (H)). Liver Function Tests: Recent Labs  Lab 08/02/19 1920  AST 14*  ALT 13  ALKPHOS 99  BILITOT 0.7  PROT 8.0  ALBUMIN 3.5   No results for input(s): LIPASE, AMYLASE in the last 168 hours. No results for input(s): AMMONIA in the last 168 hours. Coagulation Profile: Recent Labs  Lab 08/02/19 2113  INR 1.2   Cardiac Enzymes: No results for input(s): CKTOTAL, CKMB, CKMBINDEX, TROPONINI in the last 168 hours. BNP (last 3 results) No results for input(s): PROBNP in the last 8760 hours. HbA1C: No results for input(s): HGBA1C in the last 72 hours. CBG: No results for input(s): GLUCAP in the last 168 hours. Lipid Profile: No results for input(s): CHOL, HDL, LDLCALC, TRIG, CHOLHDL, LDLDIRECT in the last 72 hours. Thyroid Function Tests: No results for input(s): TSH, T4TOTAL, FREET4, T3FREE, THYROIDAB in the last 72  hours. Anemia Panel: No results for input(s): VITAMINB12, FOLATE, FERRITIN, TIBC, IRON, RETICCTPCT in the last 72 hours. Urine analysis:    Component Value Date/Time   COLORURINE YELLOW 08/02/2019 2009   APPEARANCEUR CLEAR 08/02/2019 2009   LABSPEC 1.024 08/02/2019 2009   PHURINE 5.0 08/02/2019 2009   GLUCOSEU NEGATIVE 08/02/2019 2009   HGBUR NEGATIVE 08/02/2019 2009   BILIRUBINUR NEGATIVE 08/02/2019 2009   KETONESUR NEGATIVE 08/02/2019 2009   PROTEINUR NEGATIVE 08/02/2019 2009   NITRITE NEGATIVE 08/02/2019 2009   LEUKOCYTESUR NEGATIVE 08/02/2019 2009    Radiological Exams on Admission: CT ABDOMEN PELVIS W CONTRAST  Result Date: 08/02/2019 CLINICAL DATA:  Abdominal abscess suspected. Recent abdominal surgery. EXAM: CT ABDOMEN AND PELVIS WITH CONTRAST TECHNIQUE: Multidetector CT imaging of the abdomen and pelvis was performed using the standard protocol following bolus administration of intravenous contrast. CONTRAST:  08/04/2019 OMNIPAQUE IOHEXOL 300 MG/ML  SOLN COMPARISON:  December 25, 2009.  January 22, 2019. FINDINGS: Lower chest: The lung bases are clear. The heart size is normal. Hepatobiliary: The liver is normal. Normal gallbladder.There is no biliary ductal dilation. Pancreas: Normal contours without ductal dilatation. No peripancreatic fluid collection. Spleen: No splenic laceration or hematoma. Adrenals/Urinary Tract: --Adrenal glands: No adrenal hemorrhage. --Right kidney/ureter: No hydronephrosis or perinephric hematoma. --Left kidney/ureter: No hydronephrosis or perinephric hematoma. --Urinary bladder: Unremarkable. Stomach/Bowel: --Stomach/Duodenum: No hiatal hernia or other gastric abnormality. Normal duodenal course and caliber. --Small bowel: There is no evidence for small bowel obstruction. There are multiple enteroperitoneal adhesions to the anterior abdominal wall. 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 --Colon: The patient is status post total colectomy. --Appendix: Surgically  absent. Vascular/Lymphatic: Atherosclerotic calcification is present within the non-aneurysmal abdominal aorta, without hemodynamically significant stenosis. --No retroperitoneal lymphadenopathy. --No mesenteric lymphadenopathy. --No pelvic or inguinal lymphadenopathy. Reproductive: Unremarkable Other: No ascites or free air. Th there is a curvilinear abscess in the left hemiabdomen measuring up to approximately 4.1 by 1.3 cm. This abscess extends inferiorly towards the left pericolic gutter and superiorly to terminate below the greater curvature of the stomach. In the left upper quadrant. There is significant fat stranding with likely areas of fat necrosis. Musculoskeletal. No acute displaced fractures. IMPRESSION: 1. There is a curvilinear at least 4.5 cm abscess in the left abdomen as detailed above. 2. The patient is status post total colectomy. There is fat stranding in the left upper quadrant, likely postsurgical in etiology with areas of developing fat necrosis. 3. Patent right lower quadrant ileostomy without evidence for a bowel obstruction. Electronically Signed   By: Katherine Mantle M.D.   On: 08/02/2019 22:18   DG Chest Port 1 View  Result Date: 08/02/2019 CLINICAL DATA:  Sepsis. EXAM: PORTABLE CHEST 1 VIEW COMPARISON:  December 25, 2009 FINDINGS: The heart size and mediastinal contours are within normal limits. Both lungs are clear. The visualized skeletal structures are unremarkable. Aortic calcifications are noted. IMPRESSION: No active disease. Electronically Signed   By: Katherine Mantle M.D.   On: 08/02/2019 21:16    EKG: Independently reviewed.  Assessment/Plan Principal Problem:   Sepsis associated hypotension (HCC) Active Problems:   Intra-abdominal abscess (HCC)   DM2 (diabetes mellitus, type 2) (HCC)   History of DVT (deep vein thrombosis)    1. Sepsis associated hypotension - 1. IVF: 30 cc/kg bolus + 125 cc/hr LR 2. Serial lactates 3. Tele monitor 4. Zosyn /  vanc 2. Intra-abdominal abscess / ?EC fistula 1. Wound care consult 2. See gen surg consult note 3. IR drainage 4. Holding eliquis 5. NPO 3. DM2 - 1. Hold metformin 2. Sensitive SSI Q4H 4. H/o HTN - 1. Hold all home BP meds in setting of hypotension 5. H/o DVT last July - 1. Holding eliquis  DVT prophylaxis: SCDs Code Status: Full Family Communication: No family in room Disposition Plan: Home after admit Consults called: Gen surg Admission status: Admit to inpatient  Severity of Illness: The appropriate patient status for this patient is INPATIENT. Inpatient status is judged to be reasonable and necessary in order to provide the required intensity of service to ensure the patient's safety. The patient's presenting symptoms, physical exam findings, and initial radiographic and laboratory data in the context of their chronic comorbidities is felt to place them at high risk for further clinical deterioration. Furthermore, it is not anticipated that the patient will be medically stable for discharge from the hospital within 2 midnights of admission.  The following factors support the patient status of inpatient.   IP status due to sepsis, hypotension, intra-abd abscess.   * I certify that at the point of admission it is my clinical judgment that the patient will require inpatient hospital care spanning beyond 2 midnights from the point of admission due to high intensity of service, high risk for further deterioration and high frequency of surveillance required.*    Atalaya Zappia M. DO [facility]  How to contact the [facility] Attending or Consulting provider [place] or covering provider during after hours [place], for this patient?  1. Check the care team in [facility] and look for a) attending/consulting [facility] provider listed and b) the [facility] team listed 2. Log into [email or web address]  Amion Physician Scheduling and messaging for groups and whole hospitals  On call and physician scheduling  software for group practices, residents, hospitalists and other medical providers for call, clinic, rotation and shift schedules. On[employer] is a hospital-wide system for scheduling doctors and paging doctors on call. EasyPlot is for scientific plotting and data analysis.  [email or web address]  and use [place]'s universal password to access. If you do not have the password, please contact the hospital operator.  3. Locate the [facility] provider you are looking for under [facility] and page to a number that you can be directly reached. 4. If you still have difficulty reaching the provider, please page the [facility] (Director on Call) for the Hospitalists listed on amion for assistance.  08/03/2019, 12:36 AM

## 2019-08-03 NOTE — Progress Notes (Signed)
Attempt to call sister of pt. Unable to reach at present.

## 2019-08-03 NOTE — Progress Notes (Addendum)
Patient ID: Luis [name], male   DOB: August 17, 1960, 59 y.o.   MRN: [id number] Request received from surgical team for possible image guided drainage of abdominal fluid collection in patient.  Recent and previous imaging studies were reviewed by Dr. Miles Costain.  He feels that collection is very thin /elongated and not ideal for percutaneous drainage at this time with recent imaging showing overall improvement since last CT.  He recommends holding off on drain placement at this time, continue antibiotics and rescan patient in a few days to reassess size of collection for possible drainage if necessary.  Above discussed with CCS PA.

## 2019-08-04 LAB — GLUCOSE, CAPILLARY
Glucose-Capillary: 90 mg/dL (ref 70–99)
Glucose-Capillary: 84 mg/dL (ref 70–99)
Glucose-Capillary: 127 mg/dL — ABNORMAL HIGH (ref 70–99)

## 2019-08-04 LAB — CBC
HCT: 29.7 % — ABNORMAL LOW (ref 39.0–52.0)
Hemoglobin: 9.4 g/dL — ABNORMAL LOW (ref 13.0–17.0)
MCH: 29.4 pg (ref 26.0–34.0)
MCHC: 31.6 g/dL (ref 30.0–36.0)
MCV: 92.8 fL (ref 80.0–100.0)
Platelets: 262 10*3/uL (ref 150–400)
RBC: 3.2 MIL/uL — ABNORMAL LOW (ref 4.22–5.81)
RDW: 14.6 % (ref 11.5–15.5)
WBC: 7.4 10*3/uL (ref 4.0–10.5)
nRBC: 0 % (ref 0.0–0.2)

## 2019-08-04 LAB — URINE CULTURE: Culture: NO GROWTH

## 2019-08-04 MED ORDER — SACCHAROMYCES BOULARDII 250 MG PO CAPS: 250.0000 mg | ORAL_CAPSULE | Freq: Two times a day (BID) | ORAL | 0 refills | Status: AC

## 2019-08-04 MED ORDER — AMOXICILLIN-POT CLAVULANATE 875-125 MG PO TABS: 1.0000 | ORAL_TABLET | Freq: Two times a day (BID) | ORAL | 0 refills | Status: AC

## 2019-08-04 MED ORDER — TRAMADOL HCL 50 MG PO TABS: 50.0000 mg | ORAL_TABLET | Freq: Four times a day (QID) | ORAL | 0 refills | Status: DC | PRN

## 2019-08-04 NOTE — Discharge Summary (Addendum)
Physician Discharge Summary  Luis [name] ZWC:[id number] DOB: [birth year] DOA: 08/02/2019  PCP: Henrine Screws, MD  Admit date: 08/02/2019 Discharge date: 08/04/2019  Admitted From: Home Disposition:  Home  Discharge Condition:Stable CODE STATUS:FULL Diet recommendation: Heart Healthy  Brief/Interim Summary:  HPI: Luis [name] is a 59 y.o. male with medical history significant of DM, HTN.  Recently Incarcerated in [place], had diverticulitis with perforation, had colectomy and ileostomy. 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 He finished a course of antibiotics just prior to release from prison last week. He does not know when his follow-up with surgeons is in place. He lives in [place] and prefers to have doctors locally. Over the last several days he has noticed increased purulent drainage from fistula area on his left lower abdomen. He has been doing his own wound dressings. He has not noticed any fevers but has had some discomfort. He did have DVTs while he was admitted to the hospital in [place] and is on Eliquis.  ED Course: WBC 19k, foul smelling brown drainage from abdomen wound,first lactate 5.1 and initial BP 83/65, got 30cc/kg bolus, BP improved to 101/62, but lactate now 5.7. Started on zosyn / vanc  Hospital Course:  Patient's hospital course remained stable.  Currently he is hemodynamically stable, abdominal pain is well controlled.  He was evaluated by general surgery and IR.  IR did not think that he needs percutaneous drainage at this time because the abdominal collection was not significant in size.  General surgery recommended to follow-up as an outpatient in 3 to 4 weeks.  Advance his diet.  There is no indication for emergent surgical intervention.   Following problems were addressed during his hospitalization:  Suspected sepsis: Ruled out.  Cultures negative so far.  Presented with hypotension.  Currently blood pressure  stable.  General surgery cleared for discharge.  Antibiotics changed to oral.  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  He has appointment with general surgery outpatient in 3 weeks.  Restarted Eliquis.  Diabetes type 2: On Metformin at home.  Resume  Hypertension: Currently blood stable.  Resume home medicines.  History of DVT: Continue Eliquis  Discharge Diagnoses:  Principal Problem:   Sepsis associated hypotension (HCC) Active Problems:   Intra-abdominal abscess (HCC)   DM2 (diabetes mellitus, type 2) (HCC)   History of DVT (deep vein thrombosis)    Discharge Instructions  Discharge Instructions    Diet - low sodium heart healthy   Complete by: As directed    Discharge instructions   Complete by: As directed    1)Follow up with general surgery in 3 weeks. Name and number of the provider has been attached. 2)Take prescribed medications as instructed.   Increase activity slowly   Complete by: As directed      Allergies as of 08/04/2019      Reactions   Codeine Other (See Comments)   unknown      Medication List    STOP taking these medications   atorvastatin 20 MG tablet Commonly known as: LIPITOR   gabapentin 300 MG capsule Commonly known as: NEURONTIN   pantoprazole 40 MG tablet Commonly known as: PROTONIX     TAKE these medications   acetaminophen 650 MG CR tablet Commonly known as: TYLENOL Take 650 mg by mouth every 8 (eight) hours.   amLODipine 10 MG tablet Commonly known as: NORVASC Take 10 mg by mouth daily.   amoxicillin-clavulanate 875-125 MG tablet Commonly known  as: Augmentin Take 1 tablet by mouth every 12 (twelve) hours for 21 days.   apixaban 5 MG Tabs tablet Commonly known as: ELIQUIS Take 5 mg by mouth 2 (two) times daily.   cholecalciferol 25 MCG (1000 UNIT) tablet Commonly known as: VITAMIN D3 Take 3,000 Units by mouth daily.    hydrochlorothiazide 12.5 MG capsule Commonly known as: MICROZIDE Take 12.5 mg by mouth daily.   metFORMIN 1000 MG tablet Commonly known as: GLUCOPHAGE Take 1,000 mg by mouth 2 (two) times daily with a meal.   multivitamin with minerals Tabs tablet Take 1 tablet by mouth daily.   saccharomyces boulardii 250 MG capsule Commonly known as: Florastor Take 1 capsule (250 mg total) by mouth 2 (two) times daily for 21 days.   tamsulosin 0.4 MG Caps capsule Commonly known as: FLOMAX Take 0.4 mg by mouth daily.   thiamine 100 MG tablet Take 100 mg by mouth daily.   traMADol 50 MG tablet Commonly known as: Ultram Take 1 tablet (50 mg total) by mouth every 6 (six) hours as needed for moderate pain.   vitamin B-12 500 MCG tablet Commonly known as: CYANOCOBALAMIN Take 500 mcg by mouth daily.      Follow-up Information    Emelia LoronWakefield, Matthew, MD. Call.   Specialty: General Surgery Why: Call to confirm appointment date/time in 3-4 weeks. Please arrive 30 min prior to appointment time. Bring photo ID and insurance information.  Contact information: [address] [place] [address] [phone]        [facility] Follow [facility].   Specialty: Home Health Services Why: For home health nursing Contact information: [address] [place] [address] [phone]          Allergies  Allergen Reactions  . Codeine Other (See Comments)    unknown    Consultations:  General surgery, IR   Procedures/Studies: CT ABDOMEN PELVIS W CONTRAST  Result Date: 08/02/2019 CLINICAL DATA:  Abdominal abscess suspected. Recent abdominal surgery. EXAM: CT ABDOMEN AND PELVIS WITH CONTRAST TECHNIQUE: Multidetector CT imaging of the abdomen and pelvis was performed using the standard protocol following bolus administration of intravenous contrast. CONTRAST:  100mL OMNIPAQUE IOHEXOL 300 MG/ML  SOLN COMPARISON:  December 25, 2009.  January 22, 2019. FINDINGS: Lower  chest: The lung bases are clear. The heart size is normal. Hepatobiliary: The liver is normal. Normal gallbladder.There is no biliary ductal dilation. Pancreas: Normal contours without ductal dilatation. No peripancreatic fluid collection. Spleen: No splenic laceration or hematoma. Adrenals/Urinary Tract: --Adrenal glands: No adrenal hemorrhage. --Right kidney/ureter: No hydronephrosis or perinephric hematoma. --Left kidney/ureter: No hydronephrosis or perinephric hematoma. --Urinary bladder: Unremarkable. Stomach/Bowel: --Stomach/Duodenum: No hiatal hernia or other gastric abnormality. Normal duodenal course and caliber. --Small bowel: There is no evidence for small bowel obstruction. There are multiple enteroperitoneal adhesions to the anterior abdominal wall. 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 --Colon: The patient is status post total colectomy. --Appendix: Surgically absent. Vascular/Lymphatic: Atherosclerotic calcification is present within the non-aneurysmal abdominal aorta, without hemodynamically significant stenosis. --No retroperitoneal lymphadenopathy. --No mesenteric lymphadenopathy. --No pelvic or inguinal lymphadenopathy. Reproductive: Unremarkable Other: No ascites or free air. Th there is a curvilinear abscess in the left hemiabdomen measuring up to approximately 4.1 by 1.3 cm. This abscess extends inferiorly towards the left pericolic gutter and superiorly to terminate below the greater curvature of the stomach. In the left upper quadrant. There is significant fat stranding with likely areas of fat necrosis. Musculoskeletal. No acute  displaced fractures. IMPRESSION: 1. There is a curvilinear at least 4.5 cm abscess in the left abdomen as detailed above. 2. The patient is status post total colectomy. There is fat stranding in the left upper quadrant, likely postsurgical in etiology with areas of developing fat necrosis. 3. Patent right lower quadrant ileostomy without evidence for a bowel  obstruction. Electronically Signed   By: Constance Holster M.D.   On: 08/02/2019 22:18   DG Chest Port 1 View  Result Date: 08/02/2019 CLINICAL DATA:  Sepsis. EXAM: PORTABLE CHEST 1 VIEW COMPARISON:  December 25, 2009 FINDINGS: The heart size and mediastinal contours are within normal limits. Both lungs are clear. The visualized skeletal structures are unremarkable. Aortic calcifications are noted. IMPRESSION: No active disease. Electronically Signed   By: Constance Holster M.D.   On: 08/02/2019 21:16      Subjective: Patient seen and examined at the bedside this morning.  Medically stable for discharge.  Discharge Exam: Vitals:   08/04/19 0600 08/04/19 0818  BP:  122/90  Pulse: 87 84  Resp: 10 19  Temp:  98.2 F (36.8 C)  SpO2: 96% 99%   Vitals:   08/04/19 0326 08/04/19 0400 08/04/19 0600 08/04/19 0818  BP: 107/77 117/68  122/90  Pulse: 66 66 87 84  Resp: 14 14 10 19   Temp: 98.3 F (36.8 C)   98.2 F (36.8 C)  TempSrc: Oral   Oral  SpO2: 96% 95% 96% 99%  Weight:      Height:        General: Pt is alert, awake, not in acute distress Cardiovascular: RRR, S1/S2 +, no rubs, no gallops Respiratory: CTA bilaterally, no wheezing, no rhonchi Abdominal: Soft,, nondistended, generalized tenderness, abdominal wound covered with dressing, ostomy. Extremities: no edema, no cyanosis    The results of significant diagnostics from this hospitalization (including imaging, microbiology, ancillary and laboratory) are listed below for reference.     Microbiology: Recent Results (from the past 240 hour(s))  Blood Culture (routine x 2)     Status: None (Preliminary result)   Collection Time: 08/02/19  7:15 PM   Specimen: BLOOD RIGHT HAND  Result Value Ref Range Status   Specimen Description BLOOD RIGHT HAND  Final   Special Requests   Final    BOTTLES DRAWN AEROBIC ONLY Blood Culture results may not be optimal due to an excessive volume of blood received in culture bottles   Culture    Final    NO GROWTH 2 DAYS Performed at [facility], [place] [address] [place], [place] [address]    Report Status PENDING  Incomplete  Blood Culture (routine x 2)     Status: None (Preliminary result)   Collection Time: 08/02/19  7:21 PM   Specimen: BLOOD RIGHT ARM  Result Value Ref Range Status   Specimen Description BLOOD RIGHT ARM  Final   Special Requests   Final    BOTTLES DRAWN AEROBIC AND ANAEROBIC Blood Culture adequate volume   Culture   Final    NO GROWTH 2 DAYS Performed at [facility], [place] [address], [place], [place] [address]    Report Status PENDING  Incomplete  Respiratory Panel by RT PCR (Flu A&B, Covid) - Nasopharyngeal Swab     Status: None   Collection Time: 08/02/19  9:14 PM   Specimen: Nasopharyngeal Swab  Result Value Ref Range Status   SARS Coronavirus 2 by RT PCR NEGATIVE NEGATIVE Final    Comment: (NOTE) SARS-CoV-2 target  nucleic acids are NOT DETECTED. The SARS-CoV-2 RNA is generally detectable in upper respiratoy specimens during the acute phase of infection. The lowest concentration of SARS-CoV-2 viral copies this assay can detect is 131 copies/mL. A negative result does not preclude SARS-Cov-2 infection and should not be used as the sole basis for treatment or other patient management decisions. A negative result may occur with  improper specimen collection/handling, submission of specimen other than nasopharyngeal swab, presence of viral mutation(s) within the areas targeted by this assay, and inadequate number of viral copies (<131 copies/mL). A negative result must be combined with clinical observations, patient history, and epidemiological information. The expected result is Negative. Fact Sheet for Patients:  https://www.moore.com/ Fact Sheet for Healthcare Providers:  https://www.young.biz/ This test is not yet ap proved or cleared by the [place] FDA and  has been authorized for  detection and/or diagnosis of SARS-CoV-2 by FDA under an Emergency Use Authorization (EUA). This EUA will remain  in effect (meaning this test can be used) for the duration of the COVID-19 declaration under Section 564(b)(1) of the Act, 21 U.S.C. section 360bbb-3(b)(1), unless the authorization is terminated or revoked sooner.    Influenza A by PCR NEGATIVE NEGATIVE Final   Influenza B by PCR NEGATIVE NEGATIVE Final    Comment: (NOTE) The Xpert Xpress SARS-CoV-2/FLU/RSV assay is intended as an aid in  the diagnosis of influenza from Nasopharyngeal swab specimens and  should not be used as a sole basis for treatment. Nasal washings and  aspirates are unacceptable for Xpert Xpress SARS-CoV-2/FLU/RSV  testing. Fact Sheet for Patients: https://www.moore.com/ Fact Sheet for Healthcare Providers: https://www.young.biz/ This test is not yet approved or cleared by the [place] FDA and  has been authorized for detection and/or diagnosis of SARS-CoV-2 by  FDA under an Emergency Use Authorization (EUA). This EUA will remain  in effect (meaning this test can be used) for the duration of the  Covid-19 declaration under Section 564(b)(1) of the Act, 21  U.S.C. section 360bbb-3(b)(1), unless the authorization is  terminated or revoked. Performed at [facility], [address] [place], [place] [address]   Urine culture     Status: None   Collection Time: 08/02/19 11:10 PM   Specimen: In/Out Cath Urine  Result Value Ref Range Status   Specimen Description IN/OUT CATH URINE  Final   Special Requests NONE  Final   Culture   Final    NO GROWTH Performed at [facility], [address] [place], [place] [address]    Report Status 08/04/2019 FINAL  Final  Surgical PCR screen     Status: Abnormal   Collection Time: 08/03/19  4:26 AM   Specimen: Nasal Mucosa; Nasal Swab  Result Value Ref Range Status   MRSA, PCR POSITIVE (A) NEGATIVE Final     Comment: RESULT CALLED TO, READ BACK BY AND VERIFIED WITH: RN CHALANIA @0944  08/03/19 AKT    Staphylococcus aureus POSITIVE (A) NEGATIVE Final    Comment: (NOTE) The Xpert SA Assay (FDA approved for NASAL specimens in patients 59 years of age and older), is one component of a comprehensive surveillance program. It is not intended to diagnose infection nor to guide or monitor treatment. Performed at [facility], [address], [place], [place]      Labs: BNP (last 3 results) No results for input(s): BNP in the last 8760 hours. Basic Metabolic Panel: Recent Labs  Lab 08/02/19 1920 08/03/19 0544  NA 135  136  K 3.5 3.3*  CL 98 102  CO2 20* 23  GLUCOSE 103* 111*  BUN 28* 21*  CREATININE 1.28* 1.08  CALCIUM 9.4 8.7*   Liver Function Tests: Recent Labs  Lab 08/02/19 1920 08/03/19 0544  AST 14* 10*  ALT 13 11  ALKPHOS 99 84  BILITOT 0.7 0.7  PROT 8.0 6.5  ALBUMIN 3.5 2.7*   No results for input(s): LIPASE, AMYLASE in the last 168 hours. No results for input(s): AMMONIA in the last 168 hours. CBC: Recent Labs  Lab 08/02/19 1920 08/03/19 0544 08/04/19 0251  WBC 19.1* 10.6* 7.4  NEUTROABS 14.5*  --   --   HGB 10.7* 9.2* 9.4*  HCT 33.4* 29.2* 29.7*  MCV 92.3 92.7 92.8  PLT 395 265 262   Cardiac Enzymes: No results for input(s): CKTOTAL, CKMB, CKMBINDEX, TROPONINI in the last 168 hours. BNP: Invalid input(s): POCBNP CBG: Recent Labs  Lab 08/03/19 2000 08/03/19 2344 08/04/19 0325 08/04/19 0836 08/04/19 1146  GLUCAP 108* 98 90 84 127*   D-Dimer No results for input(s): DDIMER in the last 72 hours. Hgb A1c Recent Labs    08/03/19 0020  HGBA1C 6.3*   Lipid Profile No results for input(s): CHOL, HDL, LDLCALC, TRIG, CHOLHDL, LDLDIRECT in the last 72 hours. Thyroid function studies No results for input(s): TSH, T4TOTAL, T3FREE, THYROIDAB in the last 72 hours.  Invalid input(s): FREET3 Anemia work up No results for input(s):  VITAMINB12, FOLATE, FERRITIN, TIBC, IRON, RETICCTPCT in the last 72 hours. Urinalysis    Component Value Date/Time   COLORURINE YELLOW 08/02/2019 2009   APPEARANCEUR CLEAR 08/02/2019 2009   LABSPEC 1.024 08/02/2019 2009   PHURINE 5.0 08/02/2019 2009   GLUCOSEU NEGATIVE 08/02/2019 2009   HGBUR NEGATIVE 08/02/2019 2009   BILIRUBINUR NEGATIVE 08/02/2019 2009   KETONESUR NEGATIVE 08/02/2019 2009   PROTEINUR NEGATIVE 08/02/2019 2009   NITRITE NEGATIVE 08/02/2019 2009   LEUKOCYTESUR NEGATIVE 08/02/2019 2009   Sepsis Labs Invalid input(s): PROCALCITONIN,  WBC,  LACTICIDVEN Microbiology Recent Results (from the past 240 hour(s))  Blood Culture (routine x 2)     Status: None (Preliminary result)   Collection Time: 08/02/19  7:15 PM   Specimen: BLOOD RIGHT HAND  Result Value Ref Range Status   Specimen Description BLOOD RIGHT HAND  Final   Special Requests   Final    BOTTLES DRAWN AEROBIC ONLY Blood Culture results may not be optimal due to an excessive volume of blood received in culture bottles   Culture   Final    NO GROWTH 2 DAYS Performed at [facility], [address] [place], [place] [address]    Report Status PENDING  Incomplete  Blood Culture (routine x 2)     Status: None (Preliminary result)   Collection Time: 08/02/19  7:21 PM   Specimen: BLOOD RIGHT ARM  Result Value Ref Range Status   Specimen Description BLOOD RIGHT ARM  Final   Special Requests   Final    BOTTLES DRAWN AEROBIC AND ANAEROBIC Blood Culture adequate volume   Culture   Final    NO GROWTH 2 DAYS Performed at [facility], [address] [place], [place] [address]    Report Status PENDING  Incomplete  Respiratory Panel by RT PCR (Flu A&B, Covid) - Nasopharyngeal Swab     Status: None   Collection Time: 08/02/19  9:14 PM   Specimen: Nasopharyngeal Swab  Result Value Ref Range Status   SARS Coronavirus 2 by  RT PCR NEGATIVE NEGATIVE Final    Comment: (NOTE) SARS-CoV-2 target nucleic acids  are NOT DETECTED. The SARS-CoV-2 RNA is generally detectable in upper respiratoy specimens during the acute phase of infection. The lowest concentration of SARS-CoV-2 viral copies this assay can detect is 131 copies/mL. A negative result does not preclude SARS-Cov-2 infection and should not be used as the sole basis for treatment or other patient management decisions. A negative result may occur with  improper specimen collection/handling, submission of specimen other than nasopharyngeal swab, presence of viral mutation(s) within the areas targeted by this assay, and inadequate number of viral copies (<131 copies/mL). A negative result must be combined with clinical observations, patient history, and epidemiological information. The expected result is Negative. Fact Sheet for Patients:  https://www.moore.com/ Fact Sheet for Healthcare Providers:  https://www.young.biz/ This test is not yet ap proved or cleared by the [place] FDA and  has been authorized for detection and/or diagnosis of SARS-CoV-2 by FDA under an Emergency Use Authorization (EUA). This EUA will remain  in effect (meaning this test can be used) for the duration of the COVID-19 declaration under Section 564(b)(1) of the Act, 21 U.S.C. section 360bbb-3(b)(1), unless the authorization is terminated or revoked sooner.    Influenza A by PCR NEGATIVE NEGATIVE Final   Influenza B by PCR NEGATIVE NEGATIVE Final    Comment: (NOTE) The Xpert Xpress SARS-CoV-2/FLU/RSV assay is intended as an aid in  the diagnosis of influenza from Nasopharyngeal swab specimens and  should not be used as a sole basis for treatment. Nasal washings and  aspirates are unacceptable for Xpert Xpress SARS-CoV-2/FLU/RSV  testing. Fact Sheet for Patients: https://www.moore.com/ Fact Sheet for Healthcare Providers: https://www.young.biz/ This test is not yet approved or  cleared by the [place] FDA and  has been authorized for detection and/or diagnosis of SARS-CoV-2 by  FDA under an Emergency Use Authorization (EUA). This EUA will remain  in effect (meaning this test can be used) for the duration of the  Covid-19 declaration under Section 564(b)(1) of the Act, 21  U.S.C. section 360bbb-3(b)(1), unless the authorization is  terminated or revoked. Performed at [facility], [address] [place], [place] [address]   Urine culture     Status: None   Collection Time: 08/02/19 11:10 PM   Specimen: In/Out Cath Urine  Result Value Ref Range Status   Specimen Description IN/OUT CATH URINE  Final   Special Requests NONE  Final   Culture   Final    NO GROWTH Performed at [facility], [address], [place], [place] [address]    Report Status 08/04/2019 FINAL  Final  Surgical PCR screen     Status: Abnormal   Collection Time: 08/03/19  4:26 AM   Specimen: Nasal Mucosa; Nasal Swab  Result Value Ref Range Status   MRSA, PCR POSITIVE (A) NEGATIVE Final    Comment: RESULT CALLED TO, READ BACK BY AND VERIFIED WITH: RN CHALANIA @0944  08/03/19 AKT    Staphylococcus aureus POSITIVE (A) NEGATIVE Final    Comment: (NOTE) The Xpert SA Assay (FDA approved for NASAL specimens in patients 16 years of age and older), is one component of a comprehensive surveillance program. It is not intended to diagnose infection nor to guide or monitor treatment. Performed at [facility], [address] [place], [place]     Please note: You were cared for by a hospitalist during your hospital stay. Once you are discharged, your  primary care physician will handle any further medical issues. Please note that NO REFILLS for any discharge medications will be authorized once you are discharged, as it is imperative that you return to your primary care physician (or establish a relationship with a primary care physician if you do not have one) for  your post hospital discharge needs so that they can reassess your need for medications and monitor your lab values.    Time coordinating discharge: 40 minutes  SIGNED:   Burnadette PopAmrit Damiyah Ditmars, MD  [facility] 08/04/2019, 2:56 PM Pager [phone]  If 7PM-7AM, please contact night-coverage [email or web address] Password [email or web address]

## 2019-08-04 NOTE — Discharge Instructions (Signed)
MIDLINE WOUND CARE: - midline dressing to be changed twice daily - supplies: sterile saline, gauze, scissors, ABD pads, tape  - remove dressing and all packing carefully, moistening with sterile saline as needed to avoid packing/internal dressing sticking to the wound. - clean edges of skin around the wound with water/gauze, making sure there is no tape debris or leakage left on skin that could cause skin irritation or breakdown. - dampen and clean kerlix with sterile saline and pack wound from wound base to skin level, making sure to take note of any possible areas of wound tracking, tunneling and packing appropriately. Wound can be packed loosely. Trim kerlix to size if a whole kerlix is not required. - cover wound with a dry ABD pad and secure with tape.  - write the date/time on the dry dressing/tape to better track when the last dressing change occurred. - apply any skin protectant/powder recommended by clinician to protect skin/skin folds. - change dressing as needed if leakage occurs, wound gets contaminated, or patient requests to shower. - patient may shower daily with wound open and following the shower the wound should be dried and a clean dressing placed.    Ileostomy Home Guide  An ileostomy is a surgical procedure to make an opening (stoma) for stool to leave your body. The surgery is done when a medical condition prevents stool from passing through the intestines and leaving your body through the rectum. During the surgery, a part of the small intestine (ileum) is attached to the stoma made in the abdominal wall. A bag or pouch is fitted over the stoma. Stool and gas will collect in the pouch. After having this surgery, you will need to empty and change your ileostomy pouch as needed. You will also need to take steps to care for the stoma. What are the risks? Risks include:  Skin irritation around the stoma.  Leaks in the pouching system.  Narrowing (constriction) of the stoma  if the barrier is cut smaller than the stoma. Supplies needed:  One- or two-piece pouching system.  Curved scissors to cut the ostomy barrier to fit your stoma, if needed.  Ostomy belt, if needed.  Any other items your health care provider has recommended, such as ostomy powder, paste, or skin barrier film.  Soft washcloth or soft paper towel. How do I care for my stoma? Your stoma should look pink and moist. At first, the stoma may be swollen, but this swelling will go away within 6 weeks. To care for the stoma:  Keep the skin around the stoma clean and dry.  Use a clean, soft washcloth or soft paper towel to gently wash the stoma and the skin around it with warm water.  Use stoma powder or paste on your skin only as told by your health care provider.  If your skin becomes irritated, change your ileostomy pouch. The irritation may indicate that the pouch is leaking.  Measure the stoma opening regularly and record the size. Watch for changes. Share the information with your health care provider.  Check your stoma area every day for signs of infection. Check for: ? More redness, swelling, or pain. ? More fluid or blood. ? Pus or warmth. How do I care for my ileostomy pouch? The pouch that fits over the stoma can have either one or two pieces.  One-piece pouch: The skin barrier and the pouch are combined in one unit.  Two-piece pouch: The skin barrier and the pouch are separate pieces that  attach to each other. Empty your pouch when it is one-third to one-half full. Do not let more stool or gas build up. This could cause the pouch to leak. Some pouches have a built-in gas release valve. Change your pouch every 3-4 days for the first 6 weeks, and then every 5-7 days after that. You should also change the pouch right away if the skin near the stoma looks irritated. How do I empty my ileostomy pouch? You will be taught how to empty your pouch before you leave the hospital. You may be  told to: 1. Wash your hands with soap and water. If soap and water are not available, use hand sanitizer. 2. Sit far back on the toilet. 3. Put pieces of toilet paper into the toilet water. This will prevent splashing as you empty the stool into the toilet bowl. 4. Unclip the tail end of the pouch or open it with the non-removable system that stays attached at the end of the pouch. 5. Unroll the tail of the pouch and empty the stool into the toilet. 6. Use toilet paper to clean the end of the pouch where the stool drained out. 7. Reroll the tail, and attach the clip or fastener that attaches the pieces together. 8. Wash your hands again. How do I change my ileostomy pouch? You will be taught how to change the pouch before you leave the hospital. You may be told to: 1. Lay out your supplies. 2. Wash your hands with soap and water. If soap and water are not available, use hand sanitizer. 3. Carefully remove the old pouch. 4. Wash the stoma and the skin around the stoma. Allow the area to dry. Men may be told to carefully shave any hair around the stoma. 5. Use the stoma measuring guide that comes with your pouch to decide what size hole you will need to cut in the skin barrier piece. Choose the smallest possible size that will go around the stoma but will not touch it. 6. Use the guide to trace the circle on the back of the skin barrier piece. 7. Cut out the hole. 8. Hold the skin barrier piece over the stoma to make sure the hole is the correct size. 9. Remove the adhesive paper backing from the skin barrier piece. 10. If instructed by your health care provider, squeeze stoma paste around the opening of the skin barrier piece. 11. Clean and dry the skin around the stoma again. 12. Carefully fit the skin barrier piece over your stoma. 13. If you are using a two-piece pouch, snap the pouch onto the skin barrier piece. 14. Close the tail of the pouch. 15. Put your hand over the top of the skin  barrier piece to help warm it for about 5 minutes. This helps it conform to your body better. 16. Wash your hands again. What are some general tips?  Avoid wearing tight clothing over your stoma.  You can shower with or without the pouch in place. Do not use harsh or oily soaps or lotions. Always keep the pouch on if you are taking a bath or swimming.  If your pouch gets wet, you can dry it with a hair dryer on the cool setting.  Whenever you leave home, take extra clothing and ostomy supplies with you, including a skin barrier and pouch.  Store all supplies in a cool, dry place. Do not leave supplies in extreme heat as this can melt the parts.  Return to your  normal activities as told by your health care provider. Ask your health care provider what activities are safe for you. Where to find more information  [employer] [place]: www.ostomy.org Contact a health care provider if:  You have more redness, swelling, or pain around your stoma.  You have more fluid or blood coming from your stoma.  Your stoma feels warm to the touch.  You have pus coming from your stoma.  Your stoma extends in or out farther than normal.  Your stoma becomes purple, black, or pale white.  You need to change the pouch every day.  You have a fever.  You have abdominal pain, nausea, or bloating.  No stool is passing from the stoma.  You have diarrhea, requiring you to empty the pouch more frequently than normal. Get help right away if:  Your stool is bloody.  You vomit.  You have trouble breathing.  You feel dizzy or light-headed. Summary  You will need to empty and change your ileostomy pouch as told by your health care provider.  You will need to take steps to care for the stoma.  Contact a health care provider if you need to change the pouch every day, have abdominal pain, nausea, or bloating, or if you have diarrhea, requiring you to empty the pouch more frequently  than normal.  Get help right away if your stool is bloody. This information is not intended to replace advice given to you by your health care provider. Make sure you discuss any questions you have with your health care provider. Document Revised: 05/03/2018 Document Reviewed: 05/03/2018 Elsevier Patient Education  2020 [employer].     Information on my medicine - ELIQUIS (apixaban)  Why was Eliquis prescribed for you? Eliquis was prescribed to treat blood clots that may have been found in the veins of your legs (deep vein thrombosis) or in your lungs (pulmonary embolism) and to reduce the risk of them occurring again.  What do You need to know about Eliquis ? The  dose is ONE 5 mg tablet taken TWICE daily.  Eliquis may be taken with or without food.   Try to take the dose about the same time in the morning and in the evening. If you have difficulty swallowing the tablet whole please discuss with your pharmacist how to take the medication safely.  Take Eliquis exactly as prescribed and DO NOT stop taking Eliquis without talking to the doctor who prescribed the medication.  Stopping may increase your risk of developing a new blood clot.  Refill your prescription before you run out.  After discharge, you should have regular check-up appointments with your healthcare provider that is prescribing your Eliquis.    What do you do if you miss a dose? If a dose of ELIQUIS is not taken at the scheduled time, take it as soon as possible on the same day and twice-daily administration should be resumed. The dose should not be doubled to make up for a missed dose.  Important Safety Information A possible side effect of Eliquis is bleeding. You should call your healthcare provider right away if you experience any of the following: ? Bleeding from an injury or your nose that does not stop. ? Unusual colored urine (red or dark brown) or unusual colored stools (red or black). ? Unusual  bruising for unknown reasons. ? A serious fall or if you hit your head (even if there is no bleeding).  Some medicines may interact with Eliquis  and might increase your risk of bleeding or clotting while on Eliquis. To help avoid this, consult your healthcare provider or pharmacist prior to using any new prescription or non-prescription medications, including herbals, vitamins, non-steroidal anti-inflammatory drugs (NSAIDs) and supplements.  This website has more information on Eliquis (apixaban): http://www.eliquis.com/eliquis/home

## 2019-08-04 NOTE — Progress Notes (Signed)
[facility] Progress Note     Subjective: CC: Drainage from abdominal wound.  The patient notes having mild soreness to his abdomen but denies any acute or worsening pain. He denies any nausea or vomiting. He notes having a small bowel movement yesterday with no blood. He is requesting something to eat. He understands that he will follow up with Dr. [name] in 3-4 weeks and possibly have a repeat abd CT scan to reevaluate the abdominal fluid collection. His wound is still draining purulent drainage, dressing changed.   Review of Systems  Constitutional: Negative for chills and fever.  Respiratory: Negative for shortness of breath.   Cardiovascular: Negative for chest pain.  Gastrointestinal: Negative for blood in stool, diarrhea, nausea and vomiting.     Objective: Vital signs in last 24 hours: Temp:  [98.2 F (36.8 C)-99.5 F (37.5 C)] 98.3 F (36.8 C) (02/21 0326) Pulse Rate:  [66-92] 87 (02/21 0600) Resp:  [10-18] 10 (02/21 0600) BP: (107-122)/(63-77) 117/68 (02/21 0400) SpO2:  [90 %-100 %] 96 % (02/21 0600) Last BM Date: 08/03/19  Intake/Output from previous day: 02/20 0701 - 02/21 0700 In: 2664.9 [I.V.:2102.1; IV Piggyback:562.9] Out: 350 [Urine:350] Intake/Output this shift: No intake/output data recorded.  PE: General: pleasant, WD, WN white male who is laying in bed in NAD HEENT: Sclera are noninjected.  PERRL.  Ears and nose without any masses or lesions.  Mouth is pink and moist Heart: regular, rate, and rhythm.  Normal s1,s2. No obvious murmurs, gallops, or rubs noted.  Palpable radial and pedal pulses bilaterally Lungs: CTAB, no wheezes, rhonchi, or rales noted.  Respiratory effort nonlabored Abd: soft, NT, ND, +BS, stoma intact with stool in ostomy appliance, midline wound almost at skin level with granulation tissue, small opening lateral to midline with purulent appearing drainage. MS: all 4 extremities are symmetrical with no cyanosis, clubbing,  or edema. Skin: warm and dry with no masses, lesions, or rashes Neuro: Cranial nerves 2-12 grossly intact, sensation intact Psych: A&Ox3 with an appropriate affect.   Lab Results:  Recent Labs    08/03/19 0544 08/04/19 0251  WBC 10.6* 7.4  HGB 9.2* 9.4*  HCT 29.2* 29.7*  PLT 265 262   BMET Recent Labs    08/02/19 1920 08/03/19 0544  NA 135 136  K 3.5 3.3*  CL 98 102  CO2 20* 23  GLUCOSE 103* 111*  BUN 28* 21*  CREATININE 1.28* 1.08  CALCIUM 9.4 8.7*   PT/INR Recent Labs    08/02/19 2113  LABPROT 15.3*  INR 1.2   CMP     Component Value Date/Time   NA 136 08/03/2019 0544   K 3.3 (L) 08/03/2019 0544   CL 102 08/03/2019 0544   CO2 23 08/03/2019 0544   GLUCOSE 111 (H) 08/03/2019 0544   BUN 21 (H) 08/03/2019 0544   CREATININE 1.08 08/03/2019 0544   CALCIUM 8.7 (L) 08/03/2019 0544   PROT 6.5 08/03/2019 0544   ALBUMIN 2.7 (L) 08/03/2019 0544   AST 10 (L) 08/03/2019 0544   ALT 11 08/03/2019 0544   ALKPHOS 84 08/03/2019 0544   BILITOT 0.7 08/03/2019 0544   GFRNONAA >60 08/03/2019 0544   GFRAA >60 08/03/2019 0544   Lipase  No results found for: LIPASE     Studies/Results: CT ABDOMEN PELVIS W CONTRAST  Result Date: 08/02/2019 CLINICAL DATA:  Abdominal abscess suspected. Recent abdominal surgery. EXAM: CT ABDOMEN AND PELVIS WITH CONTRAST TECHNIQUE: Multidetector CT imaging of the abdomen and pelvis was performed using  the standard protocol following bolus administration of intravenous contrast. CONTRAST:  OMNIPAQUE IOHEXOL 300 MG/ML  SOLN COMPARISON:  December 25, 2009.  January 22, 2019. FINDINGS: Lower chest: The lung bases are clear. The heart size is normal. Hepatobiliary: The liver is normal. Normal gallbladder.There is no biliary ductal dilation. Pancreas: Normal contours without ductal dilatation. No peripancreatic fluid collection. Spleen: No splenic laceration or hematoma. Adrenals/Urinary Tract: --Adrenal glands: No adrenal hemorrhage. --Right  kidney/ureter: No hydronephrosis or perinephric hematoma. --Left kidney/ureter: No hydronephrosis or perinephric hematoma. --Urinary bladder: Unremarkable. Stomach/Bowel: --Stomach/Duodenum: No hiatal hernia or other gastric abnormality. Normal duodenal course and caliber. --Small bowel: There is no evidence for small bowel obstruction. There are multiple enteroperitoneal adhesions to the anterior abdominal wall. 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 --Colon: The patient is status post total colectomy. --Appendix: Surgically absent. Vascular/Lymphatic: Atherosclerotic calcification is present within the non-aneurysmal abdominal aorta, without hemodynamically significant stenosis. --No retroperitoneal lymphadenopathy. --No mesenteric lymphadenopathy. --No pelvic or inguinal lymphadenopathy. Reproductive: Unremarkable Other: No ascites or free air. Th there is a curvilinear abscess in the left hemiabdomen measuring up to approximately 4.1 by 1.3 cm. This abscess extends inferiorly towards the left pericolic gutter and superiorly to terminate below the greater curvature of the stomach. In the left upper quadrant. There is significant fat stranding with likely areas of fat necrosis. Musculoskeletal. No acute displaced fractures. IMPRESSION: 1. There is a curvilinear at least 4.5 cm abscess in the left abdomen as detailed above. 2. The patient is status post total colectomy. There is fat stranding in the left upper quadrant, likely postsurgical in etiology with areas of developing fat necrosis. 3. Patent right lower quadrant ileostomy without evidence for a bowel obstruction. Electronically Signed   By: Katherine Mantle M.D.   On: 08/02/2019 22:18   DG Chest Port 1 View  Result Date: 08/02/2019 CLINICAL DATA:  Sepsis. EXAM: PORTABLE CHEST 1 VIEW COMPARISON:  December 25, 2009 FINDINGS: The heart size and mediastinal contours are within normal limits. Both lungs are clear. The visualized skeletal structures are  unremarkable. Aortic calcifications are noted. IMPRESSION: No active disease. Electronically Signed   By: Katherine Mantle M.D.   On: 08/02/2019 21:16    Anti-infectives: Anti-infectives (From admission, onward)   Start     Dose/Rate Route Frequency Ordered Stop   08/03/19 1000  vancomycin (VANCOCIN) IVPB 1000 mg/200 mL premix     1,000 mg 200 mL/hr over 60 Minutes Intravenous Every 12 hours 08/02/19 2120     08/03/19 0600  piperacillin-tazobactam (ZOSYN) IVPB 3.375 g     3.375 g 12.5 mL/hr over 240 Minutes Intravenous Every 8 hours 08/02/19 2120     08/02/19 2115  piperacillin-tazobactam (ZOSYN) IVPB 3.375 g     3.375 g 100 mL/hr over 30 Minutes Intravenous  Once 08/02/19 2109 08/02/19 2156   08/02/19 2115  vancomycin (VANCOREADY) IVPB 2000 mg/400 mL     2,000 mg 200 mL/hr over 120 Minutes Intravenous  Once 08/02/19 2109 08/03/19 0024       Assessment/Plan HTN Hx of DVT - eliquis on hold T2DM  Intra-abdominal abscess vs EC fistula - IR consulted and did not feel collection was amenable to drainage - patient will follow up with general surgery in 3-4 weeks for re-evaluation and repeat abdominal CT scan - Advance diet to modified diet - Transition IV ABX to oral - Eliquis resumed - No indication for emergent surgical intervention at this time, we will follow with you - stable for d/c  home from general surgery standpoint if tolerates diet   FEN: Carb Modified diet VTE: SCDs, Eliquis ID: recommend PO augmentin x3 weeks on d/c, recommend probiotic as well   LOS: 1 day    Luis [name] , PA-S  Brigid Re, [facility] 08/04/2019, 8:16 AM Please see Amion for pager number during day hours 7:00am-4:30pm

## 2019-08-04 NOTE — TOC Transition Note (Addendum)
Transition of Care [facility]) - CM/SW Discharge Note   Patient Details  Name: Luis [name] MRN: [id number] Date of Birth: [birth year]  Transition of [facility] CM/SW Contact:  Lawerance Sabal, RN Phone Number: 08/04/2019, 11:07 AM   Clinical Narrative:    11:00 Spoke to patient at the bedside. He verifies that he is uninsured. He lives at [address] [place] [address] He lives at home alone. He states that he has had an ileostomy since July 2020, and has been caring for it himself. He had an abdominal infection that has a dressing for which there is an order for a home health nurse. He states that was caring for it home prior to admission, however he will need help after dischrage to manage and assess wound. He confirms his PCP is Dr Eula Listen at [facility]. Referral made to [facility] who is the [facility] provider I am waiting for a call back from the district supervisor Camie Patience.  12:45 [facility] declined charity case. Will continue to pursue [facility] provider. Patient has been entered in [facility] system.  13:30 ALL charity [facility] agencies have declined patient. [facility] willing to take if they can get an LOG for supplies. I have notified Macario Golds, Supervisor on call for [facility], awaiting call back for approval of LOG.   14:30 LOG approved, [facility] notified, provided with MATCH, he states that he has transport to home and pharmacy.    Final next level of care: Home w Home Health Services Barriers to Discharge: No Barriers Identified   Patient Goals and CMS Choice        Discharge Placement                       Discharge Plan and Services                          HH Arranged: RN          Social Determinants of Health (SDOH) Interventions     Readmission Risk Interventions No flowsheet data found.

## 2019-08-07 LAB — CULTURE, BLOOD (ROUTINE X 2)
Culture: NO GROWTH
Culture: NO GROWTH
Special Requests: ADEQUATE

## 2019-08-14 ENCOUNTER — Other Ambulatory Visit: Payer: Self-pay | Admitting: General Surgery

## 2019-08-14 DIAGNOSIS — K651 Peritoneal abscess: Secondary | ICD-10-CM

## 2019-08-26 ENCOUNTER — Ambulatory Visit
Admission: RE | Admit: 2019-08-26 | Discharge: 2019-08-26 | Disposition: A | Discharge status: Home / Self Care | Payer: Self-pay | Source: Ambulatory Visit | Attending: General Surgery | Admitting: General Surgery

## 2019-08-26 ENCOUNTER — Inpatient Hospital Stay: Admission: RE | Admit: 2019-08-26 | Payer: Self-pay | Source: Ambulatory Visit

## 2019-08-26 DIAGNOSIS — K651 Peritoneal abscess: Secondary | ICD-10-CM

## 2019-08-26 MED ORDER — IOPAMIDOL (ISOVUE-300) INJECTION 61%
100.0000 mL | Freq: Once | INTRAVENOUS | Status: AC | PRN
  Administered 2019-08-26: 100 mL via INTRAVENOUS

## 2019-09-16 ENCOUNTER — Other Ambulatory Visit: Payer: Self-pay | Admitting: Internal Medicine

## 2019-09-16 DIAGNOSIS — I824Y2 Acute embolism and thrombosis of unspecified deep veins of left proximal lower extremity: Secondary | ICD-10-CM

## 2019-10-16 ENCOUNTER — Inpatient Hospital Stay: Admission: RE | Admit: 2019-10-16 | Payer: Self-pay | Source: Ambulatory Visit

## 2019-10-22 ENCOUNTER — Other Ambulatory Visit: Payer: Self-pay

## 2019-10-22 ENCOUNTER — Ambulatory Visit
Admission: RE | Admit: 2019-10-22 | Discharge: 2019-10-22 | Disposition: A | Discharge status: Home / Self Care | Payer: Self-pay | Source: Ambulatory Visit | Attending: Internal Medicine | Admitting: Internal Medicine

## 2019-10-22 DIAGNOSIS — I824Y2 Acute embolism and thrombosis of unspecified deep veins of left proximal lower extremity: Secondary | ICD-10-CM

## 2019-11-13 ENCOUNTER — Other Ambulatory Visit: Payer: Self-pay | Admitting: General Surgery

## 2019-11-13 DIAGNOSIS — K432 Incisional hernia without obstruction or gangrene: Secondary | ICD-10-CM

## 2019-11-20 ENCOUNTER — Encounter: Payer: Self-pay | Admitting: Plastic Surgery

## 2019-11-20 ENCOUNTER — Ambulatory Visit (INDEPENDENT_AMBULATORY_CARE_PROVIDER_SITE_OTHER): Payer: 59 | Admitting: Plastic Surgery

## 2019-11-20 ENCOUNTER — Other Ambulatory Visit: Payer: Self-pay

## 2019-11-20 VITALS — BP 143/99 | HR 41 | Temp 97.8°F | Ht 73.0 in | Wt 191.0 lb

## 2019-11-20 DIAGNOSIS — S31109S Unspecified open wound of abdominal wall, unspecified quadrant without penetration into peritoneal cavity, sequela: Secondary | ICD-10-CM | POA: Diagnosis not present

## 2019-11-20 DIAGNOSIS — L089 Local infection of the skin and subcutaneous tissue, unspecified: Secondary | ICD-10-CM

## 2019-11-20 NOTE — Progress Notes (Signed)
Referring Provider Aura Dials, MD [place],  [place] [id number]   CC:  Chief Complaint  Patient presents with  . Advice Only      Luis [name] is an 59 y.o. male.  HPI: Patient presents for evaluation regarding her chronic abdominal wound.  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  It sounds like he had some healing issues with the midline wound and I suspect suffered a infection and fascial dehiscence.  This was treated with a wound VAC for an extended period of time until it mostly epithelialized.  He has seen Dr. Donne Hazel for consideration for colostomy reversal who then sent him to me to evaluate for options regarding his abdominal wound.  Patient is very uncomfortable and is anxious to pursue any surgical treatment necessary to help with this problem.  Allergies  Allergen Reactions  . Codeine Other (See Comments)    unknown    Outpatient Encounter Medications as of 11/20/2019  Medication Sig  . acetaminophen (TYLENOL) 650 MG CR tablet Take 650 mg by mouth every 8 (eight) hours.  Marland Kitchen amLODipine (NORVASC) 10 MG tablet Take 10 mg by mouth daily.  . cholecalciferol (VITAMIN D3) 25 MCG (1000 UNIT) tablet Take 3,000 Units by mouth daily.  . hydrochlorothiazide (MICROZIDE) 12.5 MG capsule Take 12.5 mg by mouth daily.  . metFORMIN (GLUCOPHAGE) 1000 MG tablet Take 1,000 mg by mouth 2 (two) times daily with a meal.  . Multiple Vitamin (MULTIVITAMIN WITH MINERALS) TABS tablet Take 1 tablet by mouth daily.  Marland Kitchen thiamine 100 MG tablet Take 100 mg by mouth daily.  . traMADol (ULTRAM) 50 MG tablet Take 1 tablet (50 mg total) by mouth every 6 (six) hours as needed for moderate pain.  . vitamin B-12 (CYANOCOBALAMIN) 500 MCG tablet Take 500 mcg by mouth daily.  . [DISCONTINUED] apixaban (ELIQUIS) 5 MG TABS tablet Take 5 mg by mouth 2 (two) times daily.  . [DISCONTINUED] tamsulosin  (FLOMAX) 0.4 MG CAPS capsule Take 0.4 mg by mouth daily.   No facility-administered encounter medications on file as of 11/20/2019.     Past Medical History:  Diagnosis Date  . Diabetes mellitus without complication ([place])   . DVT (deep venous thrombosis) ([place])   . Hypertension     Past Surgical History:  Procedure Laterality Date  . COLOSTOMY    . TONSILLECTOMY      No family history on file.  Social History   Social History Narrative  . Not on file     Review of Systems General: Denies fevers, chills, weight loss CV: Denies chest pain, shortness of breath, palpitations  Physical Exam Vitals with BMI 11/20/2019 08/04/2019 08/04/2019  Height 6\' 1"  - -  Weight 191 lbs - -  BMI 67.6 - -  Systolic 195 093 -  Diastolic 99 90 -  Pulse 41 84 87    General:  No acute distress,  Alert and oriented, Non-Toxic, Normal speech and affect Abdomen: He has a large ventral hernia.  Centrally there is a large wound with partial epithelialization that scattered throughout.  I suspect this is healed directly over bowel.  His end colostomy is coming out the right lower quadrant.  He has quite a bit of laxity to the skin in his abdominal wall.  Assessment/Plan Patient presents with scattered epithelialization over chronic abdominal wound this likely healed directly over  his bowel.  The fascial edges are well lateral of the wound edges.  He has a scan coming up next week to continue his work-up and consider colostomy reversal at some point in the future.  I spoken with his general surgeon who wants to work on optimizing his risk factors prior to proceeding with that.  I do not think a skin graft would be very helpful for his case given the appearance of his wound at this stage.  Conservative wound care measures would be better in my opinion.  Ultimately he may be a candidate for colostomy reversal combined with ventral hernia repair with underlay biologic mesh and potential component separation but  this would be quite an undertaking.  We will plan to do wound care for now and follow along with his progress.  Over 45 minutes were spent with a face-to-face encounter, reviewing records, and coordinating this patient's care.  [name] 11/20/2019, 4:52 PM

## 2019-11-26 ENCOUNTER — Ambulatory Visit (INDEPENDENT_AMBULATORY_CARE_PROVIDER_SITE_OTHER): Payer: 59 | Admitting: Internal Medicine

## 2019-11-26 ENCOUNTER — Encounter: Payer: Self-pay | Admitting: Internal Medicine

## 2019-11-26 ENCOUNTER — Telehealth: Payer: Self-pay

## 2019-11-26 ENCOUNTER — Other Ambulatory Visit: Payer: Self-pay

## 2019-11-26 VITALS — BP 140/90 | HR 129 | Temp 96.6°F | Ht 73.0 in | Wt 185.8 lb

## 2019-11-26 DIAGNOSIS — T8130XA Disruption of wound, unspecified, initial encounter: Secondary | ICD-10-CM

## 2019-11-26 DIAGNOSIS — Z720 Tobacco use: Secondary | ICD-10-CM

## 2019-11-26 DIAGNOSIS — K439 Ventral hernia without obstruction or gangrene: Secondary | ICD-10-CM | POA: Diagnosis not present

## 2019-11-26 NOTE — Patient Instructions (Signed)
Luis [name] It was a pleasure meeting you today.  Please follow the instructions below for your wound care  1) Collagen dressing changes daily 2) followed by non stick pad (abd) 3) tape down with 73m adhesive tape  Please continue xeroform until you receive your supplies  Please follow up with me in 2 weeks.  Call us at [phone] with any questions or concerns  When you receive your supplies you can see me the same day (Mon-Thurs) for application of the collagen.

## 2019-11-26 NOTE — Progress Notes (Signed)
Subjective:     Patient ID: Luis [name], male    DOB: 1960-07-10, 59 y.o.   MRN: [id number]  Chief Complaint  Patient presents with  . Advice Only    HPI: The patient is a 59 y.o. 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  Patient states that one year ago, July 2020 he had a perforated bowel and has had an abdominal wound with ventral hernia since this time.  He tried a wound vac for 5 months on the wound.  Currently he is trying xeroform daily with dressing changes.  He has seen Dr. [name], general surgery and Dr. Arita Miss, plastic surgery for this issue.  Currently the plan is for conservative wound care with possible colostomy reversal in the future.   He reports moderate drainage to the wound.  He denies purulent drainage, erythema or increased warmth to the wound.  He states he has an abdominal binder but does not use it.    Review of Systems  All other systems reviewed and are negative.    has a past medical history of Diabetes mellitus without complication (HCC), DVT (deep venous thrombosis) (HCC), and Hypertension.  has a past surgical history that includes Colostomy and Tonsillectomy.  reports that he has been smoking cigarettes. He has a 44.00 pack-year smoking history. He uses smokeless tobacco. Objective:   Vital Signs BP 140/90 (BP Location: Left Arm, Patient Position: Sitting, Cuff Size: Large)   Pulse (!) 129   Temp (!) 96.6 F (35.9 C) (Temporal)   Ht 6\' 1"  (1.854 m)   Wt 185 lb 12.8 oz (84.3 kg)   SpO2 97%   BMI 24.51 kg/m  Vital Signs and Nursing Note Reviewed Physical Exam Skin:    Comments: Abdominal wound measures:  23 x 18.5 x 0.1 cm   Scattered epithelialization Granulation tissue throughout  Minimal sloughing noted         Assessment/Plan:     ICD-10-CM   1. Abdominal wound dehiscence, initial encounter  T81.30XA   2.  Ventral hernia without obstruction or gangrene  K43.9    Assessment:  Chronic abdominal wound  Patient presents with a chronic abdominal wound that has healed over bowel.  No signs of infection today.  His general surgeon would like to optimize risk factors prior to considering colostomy reversal and ventral hernia repair.  Patient was evaluated by Dr. who does not think a skin graft/substitute would be helpful at this time.  I will work on conservative wound care treatments and try and optimize risk factors for potential future surgery. Today the patient and I discussed changing wound care dressings from xeroform to collagen to use daily.  We also discussed the need to use an abdominal binder however patient seemed hesitant in doing this.  It will be important to relieve tension off the site in order to promote wound healing.  We will re-discuss the abdominal binder at the next visit.  He also smokes 1 pack of cigarettes a day and he was counseled on smoking cessation.  He is not ready to quit at this time.  I recommended he try and cut down.  Plan -Collagen dressing changes daily, abd and tape - continue xeroform daily until new supplies arrive  - changed orders with [facility] for collagen - adaptic dressing change in office   I spent 45 minutes face to face with the  patient. Greater than 50% of the time was spent on counseling and coordination of care, which is detailed in the A&P.    Boyd Kerbs, DO 11/26/2019, 3:04 PM

## 2019-11-26 NOTE — Telephone Encounter (Signed)
Call to [facility] per request from Dr. Mikey Bussing re: new wound/dressing orders: Faxed the new orders to [facility] @ fax# [phone] & ph# [phone] Per Frances Furbish request- we initiated a "recertification/continuation of skilled nursing care 1x/week as well as the new dressing orders" Copy of orders were scanned into pt's chart

## 2019-11-28 ENCOUNTER — Telehealth: Payer: Self-pay

## 2019-11-28 NOTE — Telephone Encounter (Signed)
Received call from [facility] with Frances Furbish [phone]) asking if there are alternate wound care orders due to the cost and size of the wound. If not, can the frequency be decreased to 3 times a week? bon

## 2019-11-29 ENCOUNTER — Other Ambulatory Visit: Payer: Self-pay

## 2019-11-29 NOTE — Telephone Encounter (Signed)
Luis [name] from [facility] said she checked with the main office and the fax didn't go through. She requested that we refax orders to [phone]. She can be reached at [phone].

## 2019-11-29 NOTE — Telephone Encounter (Signed)
Collagen daily dressings would be ideal.  However, if cost is an issue then 3 times a week collagen dressing changes would be helpful.  On the other days can continue xeroform

## 2019-12-02 ENCOUNTER — Ambulatory Visit
Admission: RE | Admit: 2019-12-02 | Discharge: 2019-12-02 | Disposition: A | Discharge status: Home / Self Care | Payer: 59 | Source: Ambulatory Visit | Attending: General Surgery | Admitting: General Surgery

## 2019-12-02 DIAGNOSIS — K432 Incisional hernia without obstruction or gangrene: Secondary | ICD-10-CM

## 2019-12-02 MED ORDER — IOPAMIDOL (ISOVUE-300) INJECTION 61%
100.0000 mL | Freq: Once | INTRAVENOUS | Status: AC | PRN
  Administered 2019-12-02: 100 mL via INTRAVENOUS

## 2019-12-09 ENCOUNTER — Telehealth: Payer: Self-pay

## 2019-12-09 NOTE — Telephone Encounter (Signed)
Re-faxed wound/dressing care orders to [facility]- attention: Beth @ fax# [phone]

## 2019-12-10 ENCOUNTER — Ambulatory Visit (INDEPENDENT_AMBULATORY_CARE_PROVIDER_SITE_OTHER): Payer: 59 | Admitting: Internal Medicine

## 2019-12-10 ENCOUNTER — Encounter: Payer: Self-pay | Admitting: Internal Medicine

## 2019-12-10 VITALS — BP 120/86 | HR 109 | Temp 96.8°F

## 2019-12-10 DIAGNOSIS — T8130XD Disruption of wound, unspecified, subsequent encounter: Secondary | ICD-10-CM | POA: Diagnosis not present

## 2019-12-10 DIAGNOSIS — K439 Ventral hernia without obstruction or gangrene: Secondary | ICD-10-CM | POA: Diagnosis not present

## 2019-12-10 NOTE — Patient Instructions (Signed)
Luis [name] It was a pleasure seeing you today.  Please follow the instructions below for your wound care  1) xeroform dressing changes daily 2) followed by non stick pad 3) tape down   Please use an abdominal binder daily  Please follow up with me in 2 weeks.  Call us at [phone] with any questions or concerns

## 2019-12-10 NOTE — Progress Notes (Signed)
° °  Subjective:     Patient ID: Luis [name], male    DOB: Jul 15, 1960, 59 y.o.   MRN: [id number]  Chief Complaint  Patient presents with   Follow-up    HPI: The patient is a 59 y.o. 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  Patient has been doing xeroform dressings changes daily.  Home health has been coming out once a week to help with dressing changes.  He states he needs additional help during the week to help with dressing changes.  He hasn't received collagen dressings from home health.  He states he pulled a muscle playing guitar and feels this enlarged his hernia.  He reports moderate drainage to the wound.  He denies purulent drainage, erythema or increased warmth to the wound.  He states he has an abdominal binder but does not use it.    Review of Systems  All other systems reviewed and are negative.    has a past medical history of Diabetes mellitus without complication (HCC), DVT (deep venous thrombosis) (HCC), and Hypertension.  has a past surgical history that includes Colostomy and Tonsillectomy.  reports that he has been smoking cigarettes. He has a 44.00 pack-year smoking history. He uses smokeless tobacco. Objective:   Vital Signs Pulse (!) 109    Temp (!) 96.8 F (36 C) (Temporal)    SpO2 97%  Vital Signs and Nursing Note Reviewed Physical Exam Skin:    Comments: Abdominal wound measures:  26 x 22 x 0.1 cm   Scattered epithelialization Granulation tissue throughout  Minimal sloughing noted          Assessment/Plan:     ICD-10-CM   1. Abdominal wound dehiscence, subsequent encounter  T81.30XD   2. Ventral hernia without obstruction or gangrene  K43.9    Assessment:  Chronic abdominal wound  Wound has grown but looks healthy with no signs of infection.  Nursing discussed case with home health and they were agreeable to increase  visits to twice a week.  They mentioned collagen pricing would be out of pocket for the patient.  I recommended collagen dressings however if cost is an issue can do xeroform dressing changes daily.  I highly recommended using an abdominal binder as the wound is getting larger.  Patient expressed understanding.  I asked him to bring the binder to the next office visit.  I will see him back in 2 weeks  Plan -collagen dressing changes daily, if can't due to cost can do xeroform dressing changes daily -in office xeroform dressing change -abdominal binder -follow up in 2 weeks.  Aldean Baker, DO 12/10/2019, 3:21 PM

## 2019-12-17 ENCOUNTER — Telehealth: Payer: Self-pay | Admitting: *Deleted

## 2019-12-17 NOTE — Telephone Encounter (Signed)
Received Orders to be signed on (12/14/19) via of fax from [facility].  Given to provider to sign.  Orders signed and faxed.  Confirmation received.  Copy scanned into the chart.//AB/CMA

## 2019-12-17 NOTE — Telephone Encounter (Addendum)
Called on (12/10/19) and spoke with Beth,RN with [facility] regarding wound care orders for the patient.  Informed her of the message on (11/29/19) from Dr. Mikey Bussing- Collagen daily dressings would be ideal.  However, if cost is an issue then 3 times a week collagen dressing changes would be helpful.  On the other days can continue xeroform.    Beth stated that the Collagen dressing come as 4x4 dressings-(50-60 per box) and the patient will have to pay out of pocket for the dressing.  She stated that the patient is using the Xeroform.  Beth asked what is the goal for the patient?  Per Dr. Mikey Bussing- The goal is to close the wound.  Informed Beth that Dr. Mikey Bussing wanted to know if we could increase the number of [facility] visits to twice a week.  Beth stated yes HH visits could be increased to twice a week.//AB/CMA

## 2019-12-18 ENCOUNTER — Telehealth: Payer: Self-pay | Admitting: *Deleted

## 2019-12-18 NOTE — Telephone Encounter (Signed)
Returned call back to Beth,RN w/[facility] on (12/12/19) regarding new wound care orders.  Beth stated that she's doing:Collagen Hydrogel (3x/week), and Xeroform in between if wound gets dry.    Informed Dr. Mikey Bussing of the above orders and she stated that the orders are fine, but she would like for the Xeroform to be used regardless if the wound gets dry or not.  She also stated to make sure the patient is wearing the abdominal binder.  Informed Beth,RN of the orders from Dr. Mikey Bussing.  She verbalized understanding and agreed.//AB/CMA

## 2019-12-24 ENCOUNTER — Encounter: Payer: Self-pay | Admitting: Internal Medicine

## 2019-12-24 ENCOUNTER — Ambulatory Visit (INDEPENDENT_AMBULATORY_CARE_PROVIDER_SITE_OTHER): Payer: 59 | Admitting: Internal Medicine

## 2019-12-24 ENCOUNTER — Other Ambulatory Visit: Payer: Self-pay

## 2019-12-24 VITALS — BP 148/94 | HR 117 | Temp 96.8°F

## 2019-12-24 DIAGNOSIS — T8130XD Disruption of wound, unspecified, subsequent encounter: Secondary | ICD-10-CM

## 2019-12-24 DIAGNOSIS — K439 Ventral hernia without obstruction or gangrene: Secondary | ICD-10-CM | POA: Diagnosis not present

## 2019-12-24 NOTE — Progress Notes (Signed)
° °  Subjective:     Patient ID: Luis [name], male    DOB: [birth year], 59 y.o.   MRN: [id number]  Chief Complaint  Patient presents with   Follow-up on abdominal wound    HPI: The patient is a 59 y.o. male with history of DM II controlled on oral agents, HTN, hx of diverticulitis with perforation, colectomy with ileostomy in placehere forchronic abdominal wound.  Patient has recently switched to collagen hydrogel daily with dressing changes.  Home health has been coming out twice weekly to help with dressing changes.  He continues to have moderate drainage to the wound.  He denies purulent drainage, erythema or increased warmth to the wound. He brought his abdominal binder with him but has not been using it.    Review of Systems  All other systems reviewed and are negative.    has a past medical history of Diabetes mellitus without complication (HCC), DVT (deep venous thrombosis) (HCC), and Hypertension.  has a past surgical history that includes Colostomy and Tonsillectomy.  reports that he has been smoking cigarettes. He has a 44.00 pack-year smoking history. He uses smokeless tobacco. Objective:   Vital Signs BP (!) 148/94 (BP Location: Left Arm, Patient Position: Sitting, Cuff Size: Large)    Pulse (!) 117    Temp (!) 96.8 F (36 C) (Temporal)    SpO2 97%  Vital Signs and Nursing Note Reviewed Physical Exam Skin:    Comments: Abdominal wound measures:  30 x 22 x 0.1 cm   Scattered epithelialization Granulation tissue throughout         Assessment/Plan:     ICD-10-CM   1. Abdominal wound dehiscence, subsequent encounter  T81.30XD   2. Ventral hernia without obstruction or gangrene  K43.9    Assessment: Chronic abdominal wound  Today the wound looks clean and healthy with no signs of infection.  The overall size has increased.  He had his abdominal binder in hand today but has not been wearing it.  I offered to put it on today after the dressing change but he  declined.  I am concerned that the hernia continues to enlarge due to lack of support to the area. I again expressed the importance of wearing the abdominal binder.   Recommended continuing with collagen hydrogel daily.  Will see patient back in two weeks.  May try endoform dermal at next visit.  Plan -collagen hydrogel daily with dressing changes -in office collagen hydrogel dressing change -follow up in 2 weeks    Aldean Baker, DO 12/24/2019, 2:30 PM

## 2020-01-06 ENCOUNTER — Encounter: Payer: Self-pay | Admitting: Internal Medicine

## 2020-01-06 ENCOUNTER — Other Ambulatory Visit: Payer: Self-pay

## 2020-01-06 ENCOUNTER — Ambulatory Visit (INDEPENDENT_AMBULATORY_CARE_PROVIDER_SITE_OTHER): Payer: 59 | Admitting: Internal Medicine

## 2020-01-06 VITALS — BP 136/80 | HR 120

## 2020-01-06 DIAGNOSIS — K439 Ventral hernia without obstruction or gangrene: Secondary | ICD-10-CM | POA: Diagnosis not present

## 2020-01-06 DIAGNOSIS — T8130XD Disruption of wound, unspecified, subsequent encounter: Secondary | ICD-10-CM | POA: Diagnosis not present

## 2020-01-06 NOTE — Progress Notes (Signed)
   Subjective:     Patient ID: Luis [name], male    DOB: 01/29/1961, 59 y.o.   MRN: [id number]  Chief Complaint  Patient presents with  . Follow-up of abdominal wound    HPI: The patient is a 59 y.o. male with history of DM II controlled on oral agents, HTN, hx of diverticulitis with perforation, colectomy with ileostomy in placehere forchronic abdominal wound.  Patient continues to use collagen hydrogel daily with dressing changes.  Home health continues to come out twice weekly to help with dressing changes.  He has moderate to heavy drainage.  He denies purulent drainage, erythema or increased warmth to the wound.  I told him that we were able to obtain endoform and he is interested in using this product.  We will be able to apply it in 2 weeks.  He also asked if I could order him an abdominal binder.   Review of Systems  All other systems reviewed and are negative.    has a past medical history of Diabetes mellitus without complication (HCC), DVT (deep venous thrombosis) (HCC), and Hypertension.  has a past surgical history that includes Colostomy and Tonsillectomy.  reports that he has been smoking cigarettes. He has a 44.00 pack-year smoking history. He uses smokeless tobacco. Objective:   Vital Signs BP (!) 136/80 (BP Location: Left Arm, Patient Position: Sitting, Cuff Size: Large)   Pulse (!) 120   SpO2 95%  Vital Signs and Nursing Note Reviewed Physical Exam Skin:    Comments: Abdominal wound measures:  30 x 22 x 0.1 cm   Scattered epithelialization Granulation tissue throughout         Assessment/Plan:     ICD-10-CM   1. Ventral hernia without obstruction or gangrene  K43.9   2. Abdominal wound dehiscence, subsequent encounter  T81.30XD    Assessment: Chronic abdominal wound  Today the wound looks stable with no signs of infection.  We will try and obtain information for an abdominal binder with an ostomy opening for patient to purchase.  At this time we  will try endoform to see if this will help epithelialize the wound.  If we are not able to make progress we will need to refer back to Dr. [name] and Dr. Arita Miss to further discuss surgical options.  Plan -In office wound cleaned with wound cleanser and gauze.  Wound dressed with Xeroform and ABD -Continue collagen hydrogel with daily dressing changes -Follow-up on August 9 for endoform placement   Aldean Baker, DO 01/06/2020, 2:24 PM

## 2020-01-07 ENCOUNTER — Telehealth: Payer: Self-pay | Admitting: Internal Medicine

## 2020-01-07 NOTE — Telephone Encounter (Signed)
Beth from [facility] calling to make sure there are no changes in patient's wound care orders. Please call [phone] to advise.

## 2020-01-08 NOTE — Telephone Encounter (Signed)
Returned [employer]. Dr. Mikey Bussing advised no orders have changed. He is to have  collagen hydrogel and ABD changed on daily basis. Beth mention patient does not like to use the collagen hydrogel on the days the SN is not there and he uses xeroform instead.  Dr. Mikey Bussing was ok with that, but prefers using the collagen hydrogel on daily basis.

## 2020-01-13 ENCOUNTER — Telehealth: Payer: Self-pay | Admitting: Internal Medicine

## 2020-01-13 NOTE — Telephone Encounter (Signed)
Patient called to follow up on abdominal binder that Dr. Mikey Bussing was supposed to look into. Please call patient to advise.

## 2020-01-13 NOTE — Telephone Encounter (Signed)
Called and informed the patient that Dr. Mikey Bussing asked me to call [facility] regarding the abdominal binder with ostomy opening.    Informed him that I called [facility] and was informed that they do not carry the abdominal binders with the ostomy opening.  Patient verbalized understanding and agreed.//AB/CMA

## 2020-01-20 ENCOUNTER — Ambulatory Visit: Payer: 59 | Admitting: Internal Medicine

## 2020-01-20 ENCOUNTER — Ambulatory Visit (INDEPENDENT_AMBULATORY_CARE_PROVIDER_SITE_OTHER): Payer: 59 | Admitting: Internal Medicine

## 2020-01-20 ENCOUNTER — Telehealth: Payer: Self-pay | Admitting: Internal Medicine

## 2020-01-20 ENCOUNTER — Encounter: Payer: Self-pay | Admitting: Internal Medicine

## 2020-01-20 ENCOUNTER — Other Ambulatory Visit: Payer: Self-pay

## 2020-01-20 VITALS — BP 127/81 | HR 111 | Temp 98.4°F

## 2020-01-20 DIAGNOSIS — K439 Ventral hernia without obstruction or gangrene: Secondary | ICD-10-CM

## 2020-01-20 DIAGNOSIS — T8130XD Disruption of wound, unspecified, subsequent encounter: Secondary | ICD-10-CM

## 2020-01-20 NOTE — Progress Notes (Signed)
   Subjective:     Patient ID: Luis [name], male    DOB: Aug 12, 1960, 59 y.o.   MRN: [id number]  Chief Complaint  Patient presents with  . Follow-up abdominal wound    HPI: The patient is a 59 y.o. male with history of DM II controlled on oral agents, HTN, hx of diverticulitis with perforation, colectomy with ileostomy in placehere forchronic abdominal wound.  Patient reports continued use of collagen hydrogel daily with dressing changes.  He states that the wound is stable.  He continues to have moderate to heavy drainage however does not soak through the ABD pad daily.  Home health continues to come out twice weekly to help with dressing changes.  He is looking forward to trying endoform today.  Our supplier was unable to obtain an abdominal binder with ostomy hole.  Patient has decided he does not want to use an abdominal binder.  Review of Systems  All other systems reviewed and are negative.    has a past medical history of Diabetes mellitus without complication (HCC), DVT (deep venous thrombosis) (HCC), and Hypertension.  has a past surgical history that includes Colostomy and Tonsillectomy.  reports that he has been smoking cigarettes. He has a 44.00 pack-year smoking history. He uses smokeless tobacco. Objective:   Vital Signs BP 127/81 (BP Location: Left Arm, Patient Position: Sitting, Cuff Size: Large)   Pulse (!) 111   Temp 98.4 F (36.9 C) (Oral)   SpO2 96%  Vital Signs and Nursing Note Reviewed Physical Exam Skin:    Comments: Abdominal wound measures:  30 x 22 x 0.1 cm   Scattered epithelialization Granulation tissue throughout           Assessment/Plan:     ICD-10-CM   1. Abdominal wound dehiscence, subsequent encounter  T81.30XD   2. Ventral hernia without obstruction or gangrene  K43.9    Assessment: Chronic abdominal wound  Today the wound looks stable with no signs of infection.  [name] from [facility] was present with endoform samples.  These  were placed during the office visit.  After Endoform layer placed, Adaptic was used on top followed by ABD and tape.  It was recommended that he change the ABD pad once drainage has soaked through.  Ideally he should keep it on for as long as he can.  He could change it in about 3 or 4 days.  He is to keep the endoform and Adaptic in place for 7 days.  I will see him in follow-up in 1 week.  Plan -In office wound cleaned with cleanser and gauze -Endoform placed in office followed by Adaptic and ABD with tape -Follow-up in 1 week -Change ABD pad only as needed  Aldean Baker, DO 01/20/2020, 3:19 PM

## 2020-01-20 NOTE — Telephone Encounter (Signed)
Patient lvm saying that his bandage was coming off and he taped it back on but he didn't think it would make it a week until his next appt; Please call patient to advise.

## 2020-01-21 ENCOUNTER — Ambulatory Visit (INDEPENDENT_AMBULATORY_CARE_PROVIDER_SITE_OTHER): Payer: Self-pay | Admitting: Internal Medicine

## 2020-01-21 ENCOUNTER — Encounter: Payer: Self-pay | Admitting: Internal Medicine

## 2020-01-21 VITALS — BP 131/93 | HR 122 | Temp 98.9°F

## 2020-01-21 DIAGNOSIS — T8130XD Disruption of wound, unspecified, subsequent encounter: Secondary | ICD-10-CM

## 2020-01-21 NOTE — Progress Notes (Signed)
Patient presented to the clinic today because his abdominal wound dressing had come off on the top.  This dressing was placed yesterday in the office.  He was able to tape it back down however was concerned it would fall back off.  Today I used two large Ace bandage wraps to keep the dressing in place.  Patient was happy with the additional support the Ace bandage provided.

## 2020-01-22 ENCOUNTER — Telehealth: Payer: Self-pay | Admitting: Internal Medicine

## 2020-01-22 NOTE — Telephone Encounter (Signed)
Patient called back to clarify instructions. He was specifically told yesterday that his bandage needed to be changed before the weekend and home health is coming out tomorrow. He is concerned that his bandages will be saturated and he is unable to change bandages by himself. The home health nurse will be in tomorrow which leaves him a day before the weekend. Please call him to advise what can be done or how to get his dressings changed until his appt on Monday.

## 2020-01-22 NOTE — Telephone Encounter (Signed)
Luis [name] from [facility] calling to wound care orders for patient. Orders can be faxed to [phone]. Her call back is number is [phone].

## 2020-01-23 ENCOUNTER — Telehealth: Payer: Self-pay | Admitting: *Deleted

## 2020-01-23 NOTE — Telephone Encounter (Signed)
Called on (01/22/20) and spoke with Waynetta Sandy w/ [facility] and informed her that the patient called back to clarify wound care instructions.  Informed her of the wound care orders:(Can take ABD pads off, can cleanse any debris and place new ABD pads. (3).  Change once on Thurs.    Beth asked if I would fax the orders to [facility].  Orders faxed on (01/23/20) and confirmation received and a copy scanned into the chart..//AB/CMA

## 2020-01-23 NOTE — Telephone Encounter (Signed)
Called on (01/22/20) and spoke with Joni Reining and gave her verbal wound care orders for the patient.  Per Dr. Hoffman:Change ABD pad if drainage is coming through the pad, and replace it with ABD,tape, and ace wrap.  If drainage is not coming through the ABD, then change dressing at nurse visits (ABD,Tape, and Ace).  Joni Reining verbalized understanding and agreed.//AB/CMA

## 2020-01-23 NOTE — Telephone Encounter (Signed)
Received Physician Order on (01/16/20) via of fax from [facility] requesting signature.  Given to provider to complete.   Orders signed and faxed back to [facility].  Confirmation received and copy scanned into the chart.//AB/CMA

## 2020-01-24 ENCOUNTER — Telehealth: Payer: Self-pay | Admitting: *Deleted

## 2020-01-24 NOTE — Telephone Encounter (Signed)
Received call from [facility] w/ [facility] on (Thurs-01/23/20) stating that the patient's dressing has come complete off.  The ace wrap has rolled up to his chest.   She stated that she will be needing new wound care orders.  Informed Beth I will speak with Dr. Mikey Bussing and give her a call back.  Called Beth back and informed her that I spoke with Dr. Mikey Bussing and she would like for her to go back to the previous wound care orders.  We will see him on Monday.  Orders:Clean up the area with saline              Continue use of Collagen Hydrogel daily               ABD pads               Tape                Ace Wrap  Beth verbalized understanding and agreed.//AB/CMA

## 2020-01-27 ENCOUNTER — Telehealth: Payer: Self-pay | Admitting: *Deleted

## 2020-01-27 ENCOUNTER — Ambulatory Visit (INDEPENDENT_AMBULATORY_CARE_PROVIDER_SITE_OTHER): Payer: 59 | Admitting: Internal Medicine

## 2020-01-27 ENCOUNTER — Encounter: Payer: Self-pay | Admitting: Internal Medicine

## 2020-01-27 ENCOUNTER — Other Ambulatory Visit: Payer: Self-pay

## 2020-01-27 VITALS — BP 138/88 | HR 100 | Wt 177.0 lb

## 2020-01-27 DIAGNOSIS — T8130XD Disruption of wound, unspecified, subsequent encounter: Secondary | ICD-10-CM

## 2020-01-27 DIAGNOSIS — K439 Ventral hernia without obstruction or gangrene: Secondary | ICD-10-CM

## 2020-01-27 NOTE — Telephone Encounter (Signed)
Faxed new wound care orders to [facility] for the patient.    Per Dr. Hoffman:Place Luis [name] Cel Ag dressing (x2) on the wound.  (If unable to get the Kerra Cel Ag can substitute with Aqua cel Ag Advantage. Place saline on 1/3 of the wound dressing Then place ABD pad on the dressing Then place Ace wrap and tape.  Change the dressing every 3 days.//AB/CMA

## 2020-01-27 NOTE — Progress Notes (Signed)
° °  Subjective:     Patient ID: Luis [name], male    DOB: 01/05/1961, 59 y.o.   MRN: [id number]  Chief Complaint  Patient presents with   Follow-up abdominal wound    HPI: The patient is a 59 y.o. male with history of DM II controlled on oral agents, HTN, hx of diverticulitis with perforation, colectomy with ileostomy in placehere forchronic abdominal wound.  Patient states that the endoform placed 1 week ago came off with dressing change on Thursday of last week.  It was in place for about 4 days prior to removal.  He continues to have moderate to heavy drainage and requires ABD changes every 2 to 3 days.  He currently denies any acute pain, purulent drainage or increased warmth or erythema to the wound.  Review of Systems  All other systems reviewed and are negative.    has a past medical history of Diabetes mellitus without complication (HCC), DVT (deep venous thrombosis) (HCC), and Hypertension.  has a past surgical history that includes Colostomy and Tonsillectomy.  reports that he has been smoking cigarettes. He has a 44.00 pack-year smoking history. He uses smokeless tobacco. Objective:   Vital Signs BP 138/88 (BP Location: Left Arm, Patient Position: Sitting, Cuff Size: Large)    Pulse 100    Wt 177 lb (80.3 kg)    SpO2 98%    BMI 23.35 kg/m  Vital Signs and Nursing Note Reviewed Physical Exam Skin:    Comments: Abdominal wound measures:  30 x 22 x 0.1 cm   Scattered epithelialization Granulation tissue throughout            Assessment/Plan:     ICD-10-CM   1. Abdominal wound dehiscence, subsequent encounter  T81.30XD   2. Ventral hernia without obstruction or gangrene  K43.9     Assessment: Chronic abdominal wound  The wound today appears to have slightly more epithelialization with the use of endoform.  It is stable with no signs of infection.  Due to the difficulty of endoform staying in place and moderate drainage I would like to switch to kerracel ag to  use every 3 days.  The patient can change the ABD pads as needed.  We will send new orders to home health.  Plan -In office wound cleaned with cleanser and gauze.  Xeroform, ABD, tape and Ace dressing change done -New order of kerracel ag to use every 3 days along with ABD, tape and Ace bandage, new orders sent to home health  -Follow-up next week  Aldean Baker, DO 01/27/2020, 4:38 PM

## 2020-01-30 ENCOUNTER — Telehealth: Payer: Self-pay | Admitting: Internal Medicine

## 2020-01-30 NOTE — Telephone Encounter (Signed)
Called Beth w/[facility] and informed her of the wound care orders for the patient.  Per Dr. Hoffman:Kerracel Ag every 3 days.  Beth verbalized understanding and agreed.//AB/CMA

## 2020-01-30 NOTE — Telephone Encounter (Signed)
Beth from [facility] calling to find out the updated wound care orders for patient. Her number is [phone]. Patient mentioned some changes being made and she would like to be prepared with what supplies she will need.

## 2020-02-03 ENCOUNTER — Encounter: Payer: Self-pay | Admitting: Internal Medicine

## 2020-02-03 ENCOUNTER — Telehealth: Payer: Self-pay | Admitting: Internal Medicine

## 2020-02-03 ENCOUNTER — Other Ambulatory Visit: Payer: Self-pay

## 2020-02-03 ENCOUNTER — Ambulatory Visit (INDEPENDENT_AMBULATORY_CARE_PROVIDER_SITE_OTHER): Payer: 59 | Admitting: Internal Medicine

## 2020-02-03 VITALS — BP 143/94 | HR 115 | Temp 97.9°F

## 2020-02-03 DIAGNOSIS — T8130XD Disruption of wound, unspecified, subsequent encounter: Secondary | ICD-10-CM | POA: Diagnosis not present

## 2020-02-03 DIAGNOSIS — K439 Ventral hernia without obstruction or gangrene: Secondary | ICD-10-CM | POA: Diagnosis not present

## 2020-02-03 NOTE — Telephone Encounter (Signed)
Returned pt's call- no answer/unablw to leave message

## 2020-02-03 NOTE — Telephone Encounter (Signed)
Patient called to inform Dr. Mikey Bussing that he has an appt with Dr. [name] on Friday morning. If she thinks this appt needs to be further out, let him know and he'll reschedule. Please call patient to advise.

## 2020-02-03 NOTE — Progress Notes (Signed)
   Subjective:     Patient ID: Luis [name], male    DOB: 1961-05-25, 59 y.o.   MRN: [id number]  Chief Complaint  Patient presents with  . Follow-up of abdominal wound    HPI: The patient is a 59 y.o. male with history of DM II controlled on oral agents, HTN, hx of diverticulitis with perforation, colectomy with ileostomy in placehere forchronic abdominal wound.  Patient states he is using Xeroform daily or collagen gel daily with dressing changes.  He was unable to obtain Kerracel AG through home health.  He does not want to pay out-of-pocket or lose home health to obtain supplies.  He currently denies any acute pain, purulent drainage or increased warmth or erythema to the wound.  Patient would like to rediscuss surgical options for his wound care.  Review of Systems  All other systems reviewed and are negative.    has a past medical history of Diabetes mellitus without complication (HCC), DVT (deep venous thrombosis) (HCC), and Hypertension.  has a past surgical history that includes Colostomy and Tonsillectomy.  reports that he has been smoking cigarettes. He has a 44.00 pack-year smoking history. He uses smokeless tobacco. Objective:   Vital Signs BP (!) 143/94 (BP Location: Left Arm, Patient Position: Sitting, Cuff Size: Normal)   Pulse (!) 115   Temp 97.9 F (36.6 C) (Oral)   SpO2 97%  Vital Signs and Nursing Note Reviewed Physical Exam Skin:    Comments: Abdominal wound measures:  30 x 22 x 0.1 cm   Scattered epithelialization Granulation tissue throughout               Assessment/Plan:     ICD-10-CM   1. Abdominal wound dehiscence, subsequent encounter  T81.30XD   2. Ventral hernia without obstruction or gangrene  K43.9    Assessment: Chronic abdominal wound  Today the wound appears stable with no signs of infection.  Over the course of 2 months the patient has tried collagen gel, Xeroform and endoform.  Due to cost, mainly because of wound size, the  patient was unable to obtain certain dressings including kerracel ag.  Patient would like to rediscuss surgical options.  I will refer back to Dr. [name] and Dr. Arita Miss.  Plan -In office wound cleaned with cleanser and gauze.  Xeroform, ABD, tape and Ace dressing change done -Can use either Xeroform or collagen gel daily with dressing changes -Refer back to Dr. [name] and Dr. Arita Miss to discuss surgical intervention -Follow-up next week for continued wound care  Aldean Baker, DO 02/03/2020, 2:42 PM

## 2020-02-03 NOTE — Telephone Encounter (Signed)
Friday is good  Thanks

## 2020-02-04 ENCOUNTER — Telehealth: Payer: Self-pay

## 2020-02-04 NOTE — Telephone Encounter (Signed)
Call to pt @ph # [phone]- no answer/unable to leave voicemail  Call to his sister-Nina [phone] @ ph# [phone]- no answer- I did leave voicemail requesting that she contact pt to let him know that Dr. [phone] confirmed that he can go to his appointment with Dr. Mikey Bussing this Friday I instructed her or the pt to call Friday back if there are any other concerns or questions

## 2020-02-04 NOTE — Telephone Encounter (Signed)
Beth from [facility] called to confirm if they are continuing with same wound care or if anything has changed. She just wants to verify that and follow up with surgery at the end of week seeing the orders weren't complete at the end of the week. She would like to get additional orders and find out frequency that he needs to be seen. Please call Beth at [phone]

## 2020-02-06 ENCOUNTER — Telehealth: Payer: Self-pay | Admitting: *Deleted

## 2020-02-10 ENCOUNTER — Ambulatory Visit (INDEPENDENT_AMBULATORY_CARE_PROVIDER_SITE_OTHER): Payer: 59 | Admitting: Internal Medicine

## 2020-02-10 ENCOUNTER — Other Ambulatory Visit: Payer: Self-pay

## 2020-02-10 ENCOUNTER — Encounter: Payer: Self-pay | Admitting: Internal Medicine

## 2020-02-10 VITALS — BP 143/85 | HR 54 | Temp 98.7°F

## 2020-02-10 DIAGNOSIS — T8130XD Disruption of wound, unspecified, subsequent encounter: Secondary | ICD-10-CM | POA: Diagnosis not present

## 2020-02-10 DIAGNOSIS — K439 Ventral hernia without obstruction or gangrene: Secondary | ICD-10-CM | POA: Diagnosis not present

## 2020-02-10 NOTE — Progress Notes (Signed)
   Subjective:     Patient ID: Luis [name], male    DOB: 1960-11-19, 59 y.o.   MRN: [id number]  Chief Complaint  Patient presents with  . Follow-up of abdominal wound    HPI: The patient is a 59 y.o. male with history of DM II controlled on oral agents, HTN, hx of diverticulitis with perforation, colectomy with ileostomy in placehere forchronic abdominal wound.  Patient states he continues to use Xeroform daily or collagen gel daily with dressing changes.  He states that the wound is stable. He currently denies any acute pain, purulent drainage or increased warmth or erythema to the wound. He saw Dr. [name] last week to rediscuss surgical options for his current condition.  At this time it was recommended to continue wound care with possible skin graft placement.  Review of Systems  All other systems reviewed and are negative.    has a past medical history of Diabetes mellitus without complication (HCC), DVT (deep venous thrombosis) (HCC), and Hypertension.  has a past surgical history that includes Colostomy and Tonsillectomy.  reports that he has been smoking cigarettes. He has a 44.00 pack-year smoking history. He uses smokeless tobacco. Objective:   Vital Signs BP (!) 143/85 (BP Location: Left Arm, Patient Position: Sitting, Cuff Size: Large)   Pulse (!) 54   Temp 98.7 F (37.1 C) (Oral)   SpO2 97%  Vital Signs and Nursing Note Reviewed Physical Exam Skin:    Comments: Abdominal wound measures:  30 x 22 x 0.1 cm   Scattered epithelialization Granulation tissue throughout       Serous drainage noted     Assessment/Plan:     ICD-10-CM   1. Abdominal wound dehiscence, subsequent encounter  T81.30XD   2. Ventral hernia without obstruction or gangrene  K43.9    Assessment: Chronic abdominal wound  Today the wound appears stable with no signs of infection.  There is actually more epithelialization around the edges of the wound.  I recommended we place endoform today  in office and patient was agreeable.  He can take this off in 2 days.  Afterwards he can continue with Xeroform or collagen gel.  We will ask home health if they can continue endoform every other day.  Will have patient follow-up with Dr. Arita Miss to rediscuss potential skin graft placement.  Plan -In office wound cleaned with normal saline and gauze.  Endoform placed followed by ABD and tape -Can use either Xeroform or collagen gel daily with dressing changes -Referral to Dr. Arita Miss -Follow-up in 2 weeks    Aldean Baker, DO 02/10/2020, 2:28 PM

## 2020-02-11 ENCOUNTER — Encounter: Payer: Self-pay | Admitting: Gastroenterology

## 2020-02-20 ENCOUNTER — Ambulatory Visit: Payer: 59 | Admitting: Plastic Surgery

## 2020-02-21 NOTE — Telephone Encounter (Signed)
Spoke with Beth,RN with [facility] on (02/04/20) to confirm orders for the patient.  Per Dr. Hoffman:Continue same wound care:Collegan Gel daily or Xeroform.  Appointment on Friday with Dr,. [place].//AB/CMA

## 2020-02-24 ENCOUNTER — Telehealth: Payer: Self-pay | Admitting: *Deleted

## 2020-02-24 NOTE — Telephone Encounter (Signed)
Spoke with Beth,RN with [facility] regarding the Kerra Cel Ag.  She stated that the dressing in not on their formulary. The only way to get it will be to:1)Change Home Health,2) Patient pay out of pocket for the dressing,or 3)Switch patient to [facility].   She stated the dressing that is on formulary is Opticell Ag.  Through [facility] the dressing will cost out of pocket for the patient will be ($50.00), and the patient want keep the dressing on no longer than 24 hours, so it would be no sense to pay out of pocket for it if he's not going to keep the dressing on for 3 days, and the ostomy leaking into the dressing.  Beth stated that it would be good to know the plan and the goal for the wound.  She said for them to continue to come out they need to show progress of the wound.    She also wanted to know the number of days for the dressing to stay on.  She also said the patient is not doing any collagen at home when he tries to change the dressing.  Per Dr. Diamantina Monks dressing should stay on for 24 hours, and continue Xeroform,collagen gel,ABD and Ace wrap.  Beth verbalized understanding and agreed.//AB/CMA

## 2020-02-24 NOTE — Telephone Encounter (Signed)
Received orders via of fax on (02/06/20) from [facility].  Requesting signature and return.  Given to provider to sign.  Orders signed and faxed to [facility].  Confirmation received and copy scanned into the chart.//AB/CMA

## 2020-02-25 ENCOUNTER — Telehealth: Payer: Self-pay | Admitting: *Deleted

## 2020-02-25 NOTE — Telephone Encounter (Signed)
Received Physician Orders on (02/21/20) via of fax from [facility].  Requesting signature and return.  Given to provider.   Orders signed and faxed back to [facility].  Confirmation received and copy scanned into the chart.//AB/CMA

## 2020-02-25 NOTE — Telephone Encounter (Signed)
Received Physician Orders on (02/23/20) via of fax from [facility].  Requesting signature and return.  Given to provider to sign.  Orders signed and faxed to [facility].  Confirmation received and copy scanned into the chart.//AB/CMA

## 2020-02-26 ENCOUNTER — Ambulatory Visit (INDEPENDENT_AMBULATORY_CARE_PROVIDER_SITE_OTHER): Payer: 59 | Admitting: Plastic Surgery

## 2020-02-26 ENCOUNTER — Ambulatory Visit (INDEPENDENT_AMBULATORY_CARE_PROVIDER_SITE_OTHER): Payer: 59 | Admitting: Internal Medicine

## 2020-02-26 ENCOUNTER — Other Ambulatory Visit: Payer: Self-pay

## 2020-02-26 ENCOUNTER — Encounter: Payer: Self-pay | Admitting: Plastic Surgery

## 2020-02-26 VITALS — BP 152/96 | HR 79 | Temp 98.5°F

## 2020-02-26 DIAGNOSIS — T8130XD Disruption of wound, unspecified, subsequent encounter: Secondary | ICD-10-CM | POA: Diagnosis not present

## 2020-02-26 DIAGNOSIS — K439 Ventral hernia without obstruction or gangrene: Secondary | ICD-10-CM

## 2020-02-26 NOTE — Progress Notes (Signed)
   Subjective:     Patient ID: Luis [name], male    DOB: 11-09-1960, 59 y.o.   MRN: [id number]  Chief Complaint  Patient presents with  . Follow-up    HPI: The patient is a 59 y.o. male with history of DM II controlled on oral agents, HTN, hx of diverticulitis with perforation, colectomy with ileostomy in placehere forchronic abdominal wound.  Patient states he continues to use Xeroform or collagen gel daily with dressing changes.  He states that the wound is stable with no signs of purulent drainage, increased warmth or erythema to the wound.  He states that the endoform that was placed at last clinic visit stayed on for about 2 days.  He saw Dr. Arita Miss today for discussion on skin graft procedure.  At this time it was agreed to proceed with this option.    Review of Systems  All other systems reviewed and are negative.    has a past medical history of Diabetes mellitus without complication (HCC), DVT (deep venous thrombosis) (HCC), and Hypertension.  has a past surgical history that includes Colostomy and Tonsillectomy.  reports that he has been smoking cigarettes. He has a 44.00 pack-year smoking history. He uses smokeless tobacco. Objective:   Vital Signs  BP 152/96Important (BP Location: Left Arm, Patient Position: Sitting, Cuff Size: Normal)  Pulse 79  Temp 98.5 F (36.9 C) (Temporal)  SpO2 97%   Vital Signs and Nursing Note Reviewed Physical Exam Skin:    Comments: Abdominal wound measures:  30 x 22 x 0.1 cm   Scattered epithelialization Granulation tissue throughout       Serous drainage noted         Assessment/Plan:     ICD-10-CM   1. Abdominal wound dehiscence, subsequent encounter  T81.30XD   2. Ventral hernia without obstruction or gangrene  K43.9    Assessment: Chronic abdominal wound  Today the wound appears stable with no signs of infection.  Patient will have skin graft placement in the next few weeks with Dr. Arita Miss.  At this time I recommended  continuing to use Xeroform or collagen gel daily.  Patient can follow-up as needed with me.  Plan -In office wound cleaned with normal saline and gauze and wrapped with Xeroform, ABD and Ace -Can use either Xeroform or collagen gel daily with dressing changes -Follow-up as needed  Aldean Baker, DO 02/26/2020, 3:56 PM

## 2020-02-26 NOTE — Progress Notes (Signed)
   Referring Provider Henrine Screws, MD [address] [place],  [place] [address]   CC:  Chief Complaint  Patient presents with  . Follow-up      Luis [name] is an 59 y.o. male.  HPI: Patient presents in follow-up for his abdominal wound.  He had a wound dehiscence and has a large midline hernia.  He has had an open wound in that area for some time and has not improved with conservative wound care measures.  He will not be a candidate for further intra-abdominal operations until his wound is healed.  He is here to discuss skin grafting.  Review of Systems General: Denies fevers and chills  Physical Exam Vitals with BMI 02/26/2020 02/10/2020 02/03/2020  Height - - -  Weight - - -  BMI - - -  Systolic 152 143 220  Diastolic 96 85 94  Pulse 79 54 115    General:  No acute distress,  Alert and oriented, Non-Toxic, Normal speech and affect Patient has a large abdominal wound over a large complex hernia.  There has not been much progress in the past several months.  There does not appear to be any infection or other explanation for the poor healing.  He has been doing Xeroform and gauze.  Assessment/Plan Patient has a chronic abdominal wound after a wound and fascial dehiscence.  I do think he is a reasonable candidate for skin graft.  I did explain that the fact that the wound is not showing any progress on its own makes the likelihood of take of a skin graft a little bit less.  However the patient is out of other options and does not seem to be getting better.  I would plan to put a wound VAC on on top of the skin graft for a week's time.  After that had more than likely remove the wound VAC and just do dressing changes until it stabilizes.  I explained the risks and benefits of the procedure that include bleeding, infection, damage to surrounding structures and need for additional procedures.  I explained the potential for poor or complete loss of the skin graft.  Patient understands we  will plan to set this up for him.  Luis [name] 02/26/2020, 2:00 PM

## 2020-02-27 ENCOUNTER — Telehealth: Payer: Self-pay

## 2020-02-27 NOTE — Telephone Encounter (Signed)
Call to pt- no answer/left v.m requesting call back from pt 

## 2020-02-28 ENCOUNTER — Telehealth: Payer: Self-pay

## 2020-02-28 NOTE — Telephone Encounter (Signed)
Call to pt- informed him that Shayna/surgery scheduler has secured a date/time for his surgery with Dr. Arita Miss as follows: Tuesday 03/10/20 @ 9am - [facility] Pt confirmed that this date will work for him I also confirmed this with Dr. Arita Miss & he approves of this plan as well. I will submit the order for the wound vac to be delivered to pt's house next week. I instructed the pt to bring the wound vac supplies with him on the day of surgery. He will bring the wound vac machine/motor/electrical plug - he will also charge the machine prior to day of surgery. He understands to bring 2-foam/dressing packs & 1 drainage reservoir. He knows that the vac will stay in place for 1 week after surgery & Dr. Arita Miss will see him in  office & remove the vac. Pt did indicate that he has home/health now- [facility]) they come to his home to assist with dressing/wound & ostomy care 2x/week.  He states that he would need help after the surgery. I told him that we would send them orders for this care after Dr. Arita Miss sees him postop.  I instructed him that scheduling will call him to confirm the dates/times for surgery & post-op visits & pre-assessment/anesthesia team will call for prior assessment.

## 2020-02-28 NOTE — Telephone Encounter (Signed)
Call to pt @ ph# [phone]- no answer-left v/m requesting call back

## 2020-02-28 NOTE — Telephone Encounter (Signed)
Faxed order for [facility]- wound vac per Dr. Arita Miss orders Sent to [facility]  Confirmation received

## 2020-03-02 ENCOUNTER — Telehealth: Payer: Self-pay

## 2020-03-02 NOTE — Telephone Encounter (Signed)
Faxed schedule change for surgery to [facility]/wound vac - pt requested change of surgery from 03/10/20 to 03/20/20 Fax confirmation received

## 2020-03-06 ENCOUNTER — Telehealth (INDEPENDENT_AMBULATORY_CARE_PROVIDER_SITE_OTHER): Payer: 59 | Admitting: Surgical

## 2020-03-06 ENCOUNTER — Encounter: Payer: Self-pay | Admitting: Surgical

## 2020-03-06 ENCOUNTER — Other Ambulatory Visit: Payer: Self-pay

## 2020-03-06 ENCOUNTER — Telehealth: Payer: Self-pay | Admitting: Internal Medicine

## 2020-03-06 DIAGNOSIS — T8130XD Disruption of wound, unspecified, subsequent encounter: Secondary | ICD-10-CM

## 2020-03-06 DIAGNOSIS — Z720 Tobacco use: Secondary | ICD-10-CM

## 2020-03-06 NOTE — Telephone Encounter (Signed)
Returned Nichole's call, LMVM to call back.

## 2020-03-06 NOTE — Progress Notes (Signed)
Patient ID: Luis [name], male    DOB: 12-08-1960, 59 y.o.   MRN: [id number]  Chief Complaint  Patient presents with  . Pre-op Exam      ICD-10-CM   1. Abdominal wound dehiscence, subsequent encounter  T81.30XD   2. Tobacco abuse  Z72.0     History of Present Illness: Luis [name] is a 59 y.o.  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 He presents for virtual preoperative evaluation for upcoming procedure, split-thickness skin graft to the abdominal wound, application of wound VAC, scheduled for 03/20/2020 with Dr. Arita Miss  The patient has not had problems with anesthesia.  Personal history of DVT and PE approximately 1 month post surgery approximately 14 months ago.  Reports his mother had a DVT in her leg, no other family history.  No family or personal history of bleeding or clotting disorders.  Patient is not currently taking any blood thinners.  No history of CVA/MI.   The patient gave consent to have this visit done by telemedicine / virtual visit.  This is also consent for access the chart and treat the patient via this visit. The patient is located at home.  I, the provider, am at the office.  We spent 15 minutes together for the visit.  Joined by telephone.  PMH Significant for: DM type II controlled on p.o. 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   Patient reports his overall been feeling fine lately, he does report that he is on chronic tramadol for pain which is prescribed by his PCP.  He reports he is otherwise doing well, has no recent chest pain, shortness of breath, weakness, vomiting.  He does have occasional nausea in the a.m.   Past Medical History: Allergies: Allergies  Allergen Reactions  . Codeine Other (See Comments)    unknown    Current Medications:  Current  Outpatient Medications:  .  acetaminophen (TYLENOL) 650 MG CR tablet, Take 650 mg by mouth every 8 (eight) hours., Disp: , Rfl:  .  amLODipine (NORVASC) 10 MG tablet, Take 10 mg by mouth daily., Disp: , Rfl:  .  cholecalciferol (VITAMIN D3) 25 MCG (1000 UNIT) tablet, Take 3,000 Units by mouth daily. (Patient not taking: Reported on 02/26/2020), Disp: , Rfl:  .  hydrochlorothiazide (MICROZIDE) 12.5 MG capsule, Take 12.5 mg by mouth daily. (Patient not taking: Reported on 02/03/2020), Disp: , Rfl:  .  metFORMIN (GLUCOPHAGE) 1000 MG tablet, Take 1,000 mg by mouth daily. , Disp: , Rfl:  .  metoprolol succinate (TOPROL-XL) 50 MG 24 hr tablet, Take 50 mg by mouth daily., Disp: , Rfl:  .  Multiple Vitamin (MULTIVITAMIN WITH MINERALS) TABS tablet, Take 1 tablet by mouth daily., Disp: , Rfl:  .  thiamine 100 MG tablet, Take 100 mg by mouth daily., Disp: , Rfl:  .  traMADol (ULTRAM) 50 MG tablet, Take 1 tablet (50 mg total) by mouth every 6 (six) hours as needed for moderate pain., Disp: 30 tablet, Rfl: 0 .  vitamin B-12 (CYANOCOBALAMIN) 500 MCG tablet, Take 500 mcg by mouth daily., Disp: , Rfl:   Past Medical Problems: Past Medical History:  Diagnosis Date  . Diabetes mellitus without complication (HCC)   . DVT (deep venous thrombosis) (HCC)   . Hypertension     Past Surgical History: Past Surgical History:  Procedure Laterality Date  .  COLOSTOMY    . TONSILLECTOMY      Social History: Social History   Socioeconomic History  . Marital status: Single    Spouse name: Not on file  . Number of children: Not on file  . Years of education: Not on file  . Highest education level: Not on file  Occupational History  . Not on file  Tobacco Use  . Smoking status: Current Every Day Smoker    Packs/day: 1.00    Years: 44.00    Pack years: 44.00    Types: Cigarettes  . Smokeless tobacco: Current User  Substance and Sexual Activity  . Alcohol use: Not on file  . Drug use: Not on file  . Sexual  activity: Not on file  Other Topics Concern  . Not on file  Social History Narrative  . Not on file   Social Determinants of Health   Financial Resource Strain:   . Difficulty of Paying Living Expenses: Not on file  Food Insecurity:   . Worried About Programme researcher, broadcasting/film/video in the Last Year: Not on file  . Ran Out of Food in the Last Year: Not on file  Transportation Needs:   . Lack of Transportation (Medical): Not on file  . Lack of Transportation (Non-Medical): Not on file  Physical Activity:   . Days of Exercise per Week: Not on file  . Minutes of Exercise per Session: Not on file  Stress:   . Feeling of Stress : Not on file  Social Connections:   . Frequency of Communication with Friends and Family: Not on file  . Frequency of Social Gatherings with Friends and Family: Not on file  . Attends Religious Services: Not on file  . Active Member of Clubs or Organizations: Not on file  . Attends Banker Meetings: Not on file  . Marital Status: Not on file  Intimate Partner Violence:   . Fear of Current or Ex-Partner: Not on file  . Emotionally Abused: Not on file  . Physically Abused: Not on file  . Sexually Abused: Not on file    Family History: History reviewed. No pertinent family history.  Review of Systems: Review of Systems  Constitutional: Negative for chills and fever.  Respiratory: Negative for shortness of breath.   Cardiovascular: Negative for chest pain and palpitations.  Gastrointestinal: Positive for nausea. Negative for vomiting.    Physical Exam: Vital Signs There were no vitals taken for this visit. Virtual preop  Assessment/Plan: The patient is scheduled for split-thickness skin graft to abdominal wound, application of wound VAC with Dr. Arita Miss.  Risks, benefits, and alternatives of procedure discussed, questions answered and consent obtained.    Smoking Status: 5 cigarettes/day; counseled patient on effects of tobacco/nicotine on wound  healing.  Counseled patient on decreasing smoking and recommended he abstain from smoking until surgery and after surgery  Caprini Score: Highest risk; Risk Factors include: History of DVT/PE, BMI greater than 25, family history of thrombosis and length of planned surgery. Recommendation for mechanical and pharmacological prophylaxis. Encourage early ambulation.   Post-op Rx sent to pharmacy: Patient is currently taking tramadol daily for ongoing pain, reports that the tramadol does not provide much relief.  I did discuss with the patient that we can prescribe Norco postoperatively for 5 days, discussed with patient that while he is taking the Norco for postop pain he should not take the tramadol.  Patient agreed and understood and stated that he would not take the tramadol  until after using the Norco for postop pain.  I called the office of Dr. Donette Larry at [facility] physicians and left a message for his nursing staff to confirm patient did not have a pain contract with them and if they were okay with [place] prescribing postop pain control.  I provided the nursing staff with our telephone number and fax number for them to call or fax any information necessary.  I will not send in any pain medications in to the pharmacy until I hear back from their office.  I thoroughly discussed the risks associated with surgery with the patient today, he also reported that Dr. Arita Miss discussed the risks with him during his consultation.  We discussed risks associated with bleeding, infection, delayed healing, risks of anesthesia, risks of deep vein thrombosis, pulmonary embolism, cardiac and pulmonary complications, pain, poor take of the graft, damage to surrounding structures and need for additional procedures.  Patient agreed and understood, I answered all of his questions associated with the risks, the surgical perioperative protocol and the postoperative protocol and I discussed with him that if he has any further questions prior to  his surgery he can call us and we can certainly discuss this further.  The risks that can be encountered with and after a skin graft were discussed and include the following but not limited to these: bleeding, infection, delayed healing, anesthesia risks, skin sensation changes, injury to structures including nerves, blood vessels, and muscles which may be temporary or permanent, allergies to tape, suture materials and glues, blood products, topical preparations or injected agents, skin contour irregularities, skin discoloration and swelling, deep vein thrombosis, cardiac and pulmonary complications, pain, which may persist, failure of the graft and possible need for revisional surgery or staged procedures.   Electronically signed by: Kermit Balo Breeze Berringer, PA-C 03/06/2020 9:22 AM

## 2020-03-06 NOTE — Telephone Encounter (Signed)
Joni Reining from Dr. Venita Sheffield office called requesting to speaking Matt's nurse regarding patient. [phone]

## 2020-03-09 ENCOUNTER — Other Ambulatory Visit: Payer: Self-pay | Admitting: Surgical

## 2020-03-09 MED ORDER — ENOXAPARIN SODIUM 40 MG/0.4ML ~~LOC~~ SOLN: 40.0000 mg | Freq: Two times a day (BID) | SUBCUTANEOUS | 0 refills | Status: DC

## 2020-03-09 MED ORDER — ENOXAPARIN SODIUM 40 MG/0.4ML ~~LOC~~ SOLN: 40.0000 mg | SUBCUTANEOUS | 0 refills | Status: DC

## 2020-03-09 NOTE — Telephone Encounter (Signed)
Returned [employer] from Dr. Venita Sheffield office

## 2020-03-09 NOTE — Progress Notes (Signed)
Change in post-op dvt ppx. 40mg  subq q24hrs starting 12 hours post-op

## 2020-03-10 ENCOUNTER — Encounter: Payer: Self-pay | Admitting: Internal Medicine

## 2020-03-10 ENCOUNTER — Telehealth: Payer: Self-pay

## 2020-03-10 NOTE — Telephone Encounter (Signed)
In further review of the chart, pt needs and office visit.    Has a healing wound from and abd absess, and ileostomy.  We have never seen pt.  Additionally he is scheduled for a skin graft on 10/8.  Multiple flags.    Please reach out to pt to schedule OV.  I have cancelled all appts until this time.

## 2020-03-10 NOTE — Telephone Encounter (Signed)
Called pt to assess if still on blood thinner.  He was recently hospitalized and was on Lovenox.  It had a start and stop date.  Most recent OV in chart does not mention it.  If is still on blood thinner will need to get directions related to holding it.

## 2020-03-16 ENCOUNTER — Telehealth: Payer: Self-pay

## 2020-03-16 NOTE — Telephone Encounter (Signed)
Patient called to state he picked up his prescription for enoxaparin but did not realize these were injections. He states he is not able to administer injections to himself and would like to discuss alternatives. He would also like to know when the pain medications will be called in for his upcoming surgery.

## 2020-03-17 ENCOUNTER — Other Ambulatory Visit (HOSPITAL_COMMUNITY): Payer: 59

## 2020-03-17 ENCOUNTER — Other Ambulatory Visit: Payer: Self-pay | Admitting: Surgical

## 2020-03-17 ENCOUNTER — Telehealth: Payer: Self-pay

## 2020-03-17 MED ORDER — HYDROCODONE-ACETAMINOPHEN 5-325 MG PO TABS: 1.0000 | ORAL_TABLET | Freq: Four times a day (QID) | ORAL | 0 refills | Status: AC | PRN

## 2020-03-17 NOTE — Telephone Encounter (Signed)
Gave message to [facility] to call.

## 2020-03-17 NOTE — Progress Notes (Signed)
I called to speak with the patient about postop pain medications and Lovenox injections postoperatively for 7 days.  Patient had a nurse at the house that provides him nursing care and wanted me to discuss this with her as he is not keen on the idea of injecting himself with a needle.  I discussed with the patient and his nurse Joni Reining that he would need to start the injections 12 to 24 hours after surgery, she stated that this is something she can provide him some education on and would be happy to show him.  I discussed with the patient that I will send in his postop pain meds today and he can start taking them the day of surgery when he returns home.  No other pain medications will be sent in, I recommend he call with any further questions or concerns.  Of note I did discuss prescribing postop pain medications with patient's PCP at [facility] physicians and his nursing staff notified me that he is not on a pain contract and they are fine with me prescribing postop pain medications.

## 2020-03-17 NOTE — Telephone Encounter (Signed)
Updated & signed order for wound vac faxed to [facility] to fax# [phone] Fax confirmation received

## 2020-03-17 NOTE — Telephone Encounter (Signed)
Patient states he picked up a prescription for a blood thinner and they are needles.  He doesn't know how to do that.  He also thought there was a pain reliever being sent to him for his surgery on Friday.  Please call him back.

## 2020-03-17 NOTE — Telephone Encounter (Signed)
Gave message to [facility] to call Patient

## 2020-03-18 ENCOUNTER — Encounter: Payer: 59 | Admitting: Plastic Surgery

## 2020-03-18 ENCOUNTER — Inpatient Hospital Stay (HOSPITAL_COMMUNITY): Admission: RE | Admit: 2020-03-18 | Payer: 59 | Source: Ambulatory Visit

## 2020-03-18 ENCOUNTER — Telehealth: Payer: Self-pay

## 2020-03-18 NOTE — Telephone Encounter (Signed)
Waynetta Sandy, a nurse from [place] [facility], called regarding Luis [name].  Beth said that an LPN was out to see the patient on Tuesday (yesterday).  Matt Therapist, art, PA-C spoke with her about needing to start patient on Lovenox on Saturday, after patient's skin graft on Friday.  Beth wanted to give [place] some information related to the lovenox.Marland KitchenMarland Kitchen  1)  Typically they go out for a teaching visit and then the patient has to do the lovenox on his own. 2)  Patient said that he was not going to be able to do the lovenox, so we may need to have a conversation with the patient about this. 3)  Can't give the lovenox in his abdomen since his entire abdomen is covered with the wound.  Are we ok with them teaching the patient to give it to himself in his thigh? 4)  Wants [place] to be aware that the patient drinks quite a bit of alcohol and smokes quite a bit of marijuana.  He has had several falls, so she is a little concerned safety-wise with the lovenox.  Beth just wants to be sure that we are aware of these things.  Please call her about the lovenox.

## 2020-03-19 ENCOUNTER — Telehealth: Payer: Self-pay | Admitting: Plastic Surgery

## 2020-03-19 NOTE — Telephone Encounter (Signed)
Called and spoke with Beth,RN with [facility] and informed her that I informed Dr. Arita Miss of her message.  He would like for them to teach the patient to give it to himself in his thigh.  She verbalized understanding and agreed.    She stated that her concern is that with all that the patient is doing she's concerned that he might fall and bleed out.    Informed Dr. Arita Miss of Beth,RN concern for the patient.//AB/CMA

## 2020-03-19 NOTE — Telephone Encounter (Signed)
Received a message from Pre-Admit nurse, Rosey Bath that Mr. Caudillo was going to call and tell us he is canceling surgery because he has been smoking and drinking too much.   I called Mr. Taha and inquired about this message, and he confirmed that he cannot stop smoking and drinking - and will have to put surgery off a while. I expressed understanding and advised him to call back when he feels he is ready to move forward.

## 2020-03-19 NOTE — Progress Notes (Signed)
Called patient for pre-op call. Pt states he needs to cancel this surgery for "about a month" because he has been drinking and smoking too much". I asked him if he had notified Dr. Thomos Lemons office and he stated "I'll call them after awhile". I instructed him to call them now to let them know he needed to cancel. He stated that he thought if he didn't show up for his Covid test yesterday (which he didn't) the surgery was automatically cancelled. I told him he still needs to call because it has not been cancelled. He stated he would call. I called Dr. Thomos Lemons office and spoke with Cala Bradford and let her know what patient had told me about cancelling the surgery.

## 2020-03-20 ENCOUNTER — Ambulatory Visit (HOSPITAL_COMMUNITY): Admission: RE | Admit: 2020-03-20 | Payer: 59 | Source: Home / Self Care | Admitting: Plastic Surgery

## 2020-03-20 ENCOUNTER — Encounter (HOSPITAL_COMMUNITY): Admission: RE | Payer: Self-pay | Source: Home / Self Care

## 2020-03-20 SURGERY — APPLICATION, GRAFT, SKIN, SPLIT-THICKNESS
Anesthesia: General | Site: Abdomen

## 2020-03-26 ENCOUNTER — Encounter: Payer: 59 | Admitting: Plastic Surgery

## 2020-03-31 ENCOUNTER — Encounter: Payer: 59 | Admitting: Gastroenterology

## 2020-04-03 ENCOUNTER — Telehealth: Payer: Self-pay | Admitting: Plastic Surgery

## 2020-04-03 NOTE — Telephone Encounter (Signed)
Beth, from [place], called to give an fyi. Over the last 2 or 3 vists, the patient hadn't been changing the dressings daily. A couple of days has passed since he had last changed it and he has been intoxicated. She said there are some nongranulating areas on the top of the area. If there are any questions, she can be reached at [phone].

## 2020-04-07 NOTE — Telephone Encounter (Signed)
Called on (04/06/20) and spoke with [facility] w/[facility] regarding the message below.  Informed her that I informed Dr. Arita Miss of the message.  Informed her that we are no seeing the patient.  He canceled his surgery.    Informed Beth that I spoke with Dr. Arita Miss, and if she's needing any orders for the patient she's will need to reach out to the patient's PCP or wound care.  Beth verbalized understanding and agreed.  She stated that the patient has not been under wound care, but she can reach out to the PCP.    Beth stated that she has been speaking with the patient regarding changing his wound, but he just says he can't do it, and he needs her to come out.  She also have been speaking to him about the drinking not helping the wound to heal.    Beth stated that the orders for [facility] is about to run out and she will be coming only 1x/week until it runs out.    Beth stated that she will keep [place] informed on how things are going.//AB/CMA

## 2020-04-14 ENCOUNTER — Encounter: Payer: Self-pay | Admitting: Internal Medicine

## 2020-04-14 ENCOUNTER — Telehealth: Payer: Self-pay

## 2020-04-14 NOTE — Telephone Encounter (Signed)
Called and spoke with the patient regarding the message below.  Informed the patient that there is a number on the machine or in the paperwork he will need to call and ask [facility] how he will need to send the wound vac back.  Patient verbalized understanding and agreed.//AB/CMA

## 2020-04-14 NOTE — Telephone Encounter (Signed)
Patient called to state he still has his wound vac what was sent to him. Since that surgery was cancelled, he would like to know what to do with the wound vac.

## 2020-04-29 ENCOUNTER — Ambulatory Visit: Payer: 59 | Admitting: Internal Medicine

## 2020-05-05 ENCOUNTER — Encounter: Payer: Self-pay | Admitting: Family Medicine

## 2020-05-05 ENCOUNTER — Telehealth: Payer: Self-pay | Admitting: Plastic Surgery

## 2020-05-05 NOTE — Telephone Encounter (Signed)
Received a call from patient today, explaining that he needs his surgery very quickly. I asked if there was anything in particular going on that caused him to change his mind, since he had canceled the two previously scheduled surgeries. I explained that our surgery schedule is full for the rest of this year, and I am not sure that I can accommodate an additional surgery - especially without Dr. Thomos Lemons advisement and based on the previous situation.   I inquired about his alcohol consumption and smoking, since this was the primary reason that he canceled his last 2 surgeries. Mr. Creger response was "I quit." I asked when he quit and he replied "tomorrow." I explained that I would have to relay this information to Dr. Arita Miss, as these factors are important to his surgery outcome and wound healing. He indicated that he understood what I was saying, but that I needed to tell Dr. Arita Miss he needs his surgery very soon, since his abdomen was hurting. I advised that I will communicate with Dr. Arita Miss and see what solutions we can come up with, but that if his pain is as bad as he says it is, he should consider going to the emergency department. The patient expressed understanding and will wait for a return call. I did offer a follow up appointment with our PA for Dec, which he agreed to for now.

## 2020-06-24 ENCOUNTER — Ambulatory Visit: Payer: 59 | Admitting: Surgical

## 2020-10-30 ENCOUNTER — Telehealth: Payer: Self-pay | Admitting: Plastic Surgery

## 2020-10-30 NOTE — Telephone Encounter (Signed)
Received message from front desk staff on 5/19 that the patient wanted to schedule surgery. I advised that I will speak with Dr. Arita Miss, as the patient was previously advised to contact [facility] for wound care and surgery options, since that facility type may be more appropriate for his needs based on the severity of his health history. Dr. Arita Miss agreed that this plan still seems the most advantageous; the patient has stated he was not a surgical candidate when we previously scheduled him for surgery and his lack of compliance is concerning. Dr. Arita Miss said he will be happy to put in a referral, if the patient wishes, or can advise to see the wound care clinic for management of the wound.   I spoke to the patient and relayed the information above. Luis [name] said he "can contact [facility] to set up an appointment, but he thought maybe the skin graft should come first." I explained that we will be happy to put the referral in, but [facility] would be able to assess the wound and determine the best course of action. The patient has the information for [facility] and will reach back out to them. I provided the number to the wound care clinic in the interim, since the timeline for seeing [facility] is still unknown. The patient expressed understanding and agreement and will call the wound care clinic and [facility]. Patient was advised to let us know if he needs assistance with getting these set up.

## 2020-11-19 ENCOUNTER — Ambulatory Visit (HOSPITAL_BASED_OUTPATIENT_CLINIC_OR_DEPARTMENT_OTHER): Payer: Self-pay | Admitting: Internal Medicine

## 2020-12-25 ENCOUNTER — Emergency Department (HOSPITAL_COMMUNITY): Payer: 59

## 2020-12-25 ENCOUNTER — Other Ambulatory Visit: Payer: Self-pay

## 2020-12-25 ENCOUNTER — Inpatient Hospital Stay (HOSPITAL_COMMUNITY)
Admission: EM | Admit: 2020-12-25 | Discharge: 2021-01-01 | DRG: 229 | Disposition: A | Discharge status: Home / Self Care | Payer: 59 | Attending: Cardiology | Admitting: Cardiology

## 2020-12-25 ENCOUNTER — Encounter (HOSPITAL_COMMUNITY): Payer: Self-pay | Admitting: Emergency Medicine

## 2020-12-25 DIAGNOSIS — E785 Hyperlipidemia, unspecified: Secondary | ICD-10-CM | POA: Diagnosis present

## 2020-12-25 DIAGNOSIS — F10230 Alcohol dependence with withdrawal, uncomplicated: Secondary | ICD-10-CM | POA: Diagnosis present

## 2020-12-25 DIAGNOSIS — Z22322 Carrier or suspected carrier of Methicillin resistant Staphylococcus aureus: Secondary | ICD-10-CM

## 2020-12-25 DIAGNOSIS — Z9049 Acquired absence of other specified parts of digestive tract: Secondary | ICD-10-CM

## 2020-12-25 DIAGNOSIS — Z950 Presence of cardiac pacemaker: Secondary | ICD-10-CM

## 2020-12-25 DIAGNOSIS — I10 Essential (primary) hypertension: Secondary | ICD-10-CM | POA: Diagnosis present

## 2020-12-25 DIAGNOSIS — Z7141 Alcohol abuse counseling and surveillance of alcoholic: Secondary | ICD-10-CM

## 2020-12-25 DIAGNOSIS — Z006 Encounter for examination for normal comparison and control in clinical research program: Secondary | ICD-10-CM

## 2020-12-25 DIAGNOSIS — Z20822 Contact with and (suspected) exposure to covid-19: Secondary | ICD-10-CM | POA: Diagnosis present

## 2020-12-25 DIAGNOSIS — Z932 Ileostomy status: Secondary | ICD-10-CM

## 2020-12-25 DIAGNOSIS — Z7984 Long term (current) use of oral hypoglycemic drugs: Secondary | ICD-10-CM

## 2020-12-25 DIAGNOSIS — I514 Myocarditis, unspecified: Secondary | ICD-10-CM | POA: Diagnosis present

## 2020-12-25 DIAGNOSIS — Z79899 Other long term (current) drug therapy: Secondary | ICD-10-CM

## 2020-12-25 DIAGNOSIS — I442 Atrioventricular block, complete: Principal | ICD-10-CM

## 2020-12-25 DIAGNOSIS — F419 Anxiety disorder, unspecified: Secondary | ICD-10-CM | POA: Diagnosis present

## 2020-12-25 DIAGNOSIS — L089 Local infection of the skin and subcutaneous tissue, unspecified: Secondary | ICD-10-CM

## 2020-12-25 DIAGNOSIS — F17200 Nicotine dependence, unspecified, uncomplicated: Secondary | ICD-10-CM | POA: Diagnosis present

## 2020-12-25 DIAGNOSIS — Z86718 Personal history of other venous thrombosis and embolism: Secondary | ICD-10-CM

## 2020-12-25 DIAGNOSIS — Z79891 Long term (current) use of opiate analgesic: Secondary | ICD-10-CM

## 2020-12-25 DIAGNOSIS — K439 Ventral hernia without obstruction or gangrene: Secondary | ICD-10-CM | POA: Diagnosis present

## 2020-12-25 DIAGNOSIS — Z885 Allergy status to narcotic agent status: Secondary | ICD-10-CM

## 2020-12-25 DIAGNOSIS — I119 Hypertensive heart disease without heart failure: Secondary | ICD-10-CM | POA: Diagnosis present

## 2020-12-25 DIAGNOSIS — E1169 Type 2 diabetes mellitus with other specified complication: Secondary | ICD-10-CM

## 2020-12-25 DIAGNOSIS — Z716 Tobacco abuse counseling: Secondary | ICD-10-CM

## 2020-12-25 DIAGNOSIS — E119 Type 2 diabetes mellitus without complications: Secondary | ICD-10-CM

## 2020-12-25 DIAGNOSIS — R001 Bradycardia, unspecified: Secondary | ICD-10-CM | POA: Diagnosis present

## 2020-12-25 DIAGNOSIS — S31109D Unspecified open wound of abdominal wall, unspecified quadrant without penetration into peritoneal cavity, subsequent encounter: Secondary | ICD-10-CM

## 2020-12-25 DIAGNOSIS — E875 Hyperkalemia: Secondary | ICD-10-CM | POA: Diagnosis present

## 2020-12-25 DIAGNOSIS — Z933 Colostomy status: Secondary | ICD-10-CM

## 2020-12-25 DIAGNOSIS — F17210 Nicotine dependence, cigarettes, uncomplicated: Secondary | ICD-10-CM | POA: Diagnosis present

## 2020-12-25 HISTORY — DX: Diverticulitis of intestine, part unspecified, without perforation or abscess without bleeding: K57.92

## 2020-12-25 HISTORY — DX: Hyperlipidemia, unspecified: E78.5

## 2020-12-25 HISTORY — DX: Acquired absence of other specified parts of digestive tract: Z90.49

## 2020-12-25 HISTORY — DX: Alcohol abuse, uncomplicated: F10.10

## 2020-12-25 HISTORY — DX: Nicotine dependence, unspecified, uncomplicated: F17.200

## 2020-12-25 LAB — TROPONIN I (HIGH SENSITIVITY)
Troponin I (High Sensitivity): 44 ng/L — ABNORMAL HIGH (ref <18)
Troponin I (High Sensitivity): 44 ng/L — ABNORMAL HIGH (ref <18)

## 2020-12-25 LAB — CBC WITH DIFFERENTIAL/PLATELET
Abs Immature Granulocytes: 0.05 10*3/uL (ref 0.00–0.07)
Basophils Absolute: 0.1 10*3/uL (ref 0.0–0.1)
Basophils Relative: 1 %
Eosinophils Absolute: 0.1 10*3/uL (ref 0.0–0.5)
Eosinophils Relative: 1 %
HCT: 38.6 % — ABNORMAL LOW (ref 39.0–52.0)
Hemoglobin: 12.7 g/dL — ABNORMAL LOW (ref 13.0–17.0)
Immature Granulocytes: 1 %
Lymphocytes Relative: 16 %
Lymphs Abs: 1.6 10*3/uL (ref 0.7–4.0)
MCH: 35 pg — ABNORMAL HIGH (ref 26.0–34.0)
MCHC: 32.9 g/dL (ref 30.0–36.0)
MCV: 106.3 fL — ABNORMAL HIGH (ref 80.0–100.0)
Monocytes Absolute: 1 10*3/uL (ref 0.1–1.0)
Monocytes Relative: 10 %
Neutro Abs: 7.3 10*3/uL (ref 1.7–7.7)
Neutrophils Relative %: 71 %
Platelets: 320 10*3/uL (ref 150–400)
RBC: 3.63 MIL/uL — ABNORMAL LOW (ref 4.22–5.81)
RDW: 12.8 % (ref 11.5–15.5)
WBC: 10.1 10*3/uL (ref 4.0–10.5)
nRBC: 0 % (ref 0.0–0.2)

## 2020-12-25 LAB — COMPREHENSIVE METABOLIC PANEL
ALT: 29 U/L (ref 0–44)
AST: 33 U/L (ref 15–41)
Albumin: 3.4 g/dL — ABNORMAL LOW (ref 3.5–5.0)
Alkaline Phosphatase: 124 U/L (ref 38–126)
Anion gap: 12 (ref 5–15)
BUN: 26 mg/dL — ABNORMAL HIGH (ref 8–23)
CO2: 17 mmol/L — ABNORMAL LOW (ref 22–32)
Calcium: 8.9 mg/dL (ref 8.9–10.3)
Chloride: 103 mmol/L (ref 98–111)
Creatinine, Ser: 1.32 mg/dL — ABNORMAL HIGH (ref 0.61–1.24)
GFR, Estimated: >60 mL/min (ref >60)
Glucose, Bld: 99 mg/dL (ref 70–99)
Potassium: 5.5 mmol/L — ABNORMAL HIGH (ref 3.5–5.1)
Sodium: 132 mmol/L — ABNORMAL LOW (ref 135–145)
Total Bilirubin: 0.9 mg/dL (ref 0.3–1.2)
Total Protein: 7.4 g/dL (ref 6.5–8.1)

## 2020-12-25 LAB — RESP PANEL BY RT-PCR (FLU A&B, COVID) ARPGX2
Influenza A by PCR: NEGATIVE
Influenza B by PCR: NEGATIVE
SARS Coronavirus 2 by RT PCR: NEGATIVE

## 2020-12-25 LAB — D-DIMER, QUANTITATIVE: D-Dimer, Quant: 2.02 ug/mL-FEU — ABNORMAL HIGH (ref 0.00–0.50)

## 2020-12-25 LAB — LIPASE, BLOOD: Lipase: 46 U/L (ref 11–51)

## 2020-12-25 MED ORDER — MIDAZOLAM HCL 2 MG/2ML IJ SOLN
2.0000 mg | Freq: Once | INTRAMUSCULAR | Status: AC
  Administered 2020-12-25: 2 mg via INTRAVENOUS
  Filled 2020-12-25: qty 2

## 2020-12-25 MED ORDER — IOHEXOL 350 MG/ML SOLN
100.0000 mL | Freq: Once | INTRAVENOUS | Status: AC | PRN
  Administered 2020-12-25: 100 mL via INTRAVENOUS

## 2020-12-25 NOTE — ED Notes (Signed)
Pt placed on 2L [place], O2 sats improved to 94%

## 2020-12-25 NOTE — ED Provider Notes (Signed)
[facility]   CSN: [id number] Arrival date & time: 12/25/20  2011     History Chief Complaint  Patient presents with   Shortness of Breath    Luis FootsJohn [name] is a 60 y.o. male.   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  Associated symptoms include orthostatic lightheadedness, orthopnea (longstanding).  He does note that he was started on metoprolol recently for uncertain condition, and that before that he was having syncopal events associated with this pathology.  He additionally reports worsening longstanding skin infection with purulent drainage over abdomen.  No further acute medical concern at this time including fevers, chills, sore throat, rhinorrhea, cough, N/V, dysuria, hematuria, headache, focal paresthesias/weakness, seizure, new rash.  History obtained from patient and chart review.     Past Medical History:  Diagnosis Date   Diabetes mellitus without complication (HCC)    Diverticulitis    DVT (deep venous thrombosis) (HCC)    Hypertension     Patient Active Problem List   Diagnosis Date Noted   Complete heart block (HCC) 12/26/2020   Sepsis associated hypotension (HCC) 08/03/2019   Intra-abdominal abscess (HCC) 08/03/2019   DM2 (diabetes mellitus, type 2) (HCC) 08/03/2019   History of DVT (deep vein thrombosis) 08/03/2019    Past Surgical History:  Procedure Laterality Date   COLOSTOMY     TONSILLECTOMY         No family history on file.  Social History   Tobacco Use   Smoking status: Every Day    Packs/day: 1.00    Years: 44.00    Pack years: 44.00    Types: Cigarettes   Smokeless tobacco: Current  Substance Use  Topics   Alcohol use: Yes   Drug use: Yes    Types: Marijuana    Home Medications Prior to Admission medications   Medication Sig Start Date End Date Taking? Authorizing Provider  acetaminophen (TYLENOL) 650 MG CR tablet Take 650 mg by mouth every 8 (eight) hours as needed for pain.    Yes [provider]  amLODipine (NORVASC) 10 MG tablet Take 10 mg by mouth daily.   Yes [provider]  atorvastatin (LIPITOR) 10 MG tablet Take 10 mg by mouth daily. 11/20/20  Yes [provider]  ibuprofen (ADVIL) 200 MG tablet Take 200 mg by mouth every 6 (six) hours as needed for headache or moderate pain.   Yes [provider]  metFORMIN (GLUCOPHAGE) 1000 MG tablet Take 500 mg by mouth daily.    Yes [provider]  metoprolol succinate (TOPROL-XL) 25 MG 24 hr tablet Take 25 mg by mouth daily. 12/20/20  Yes [provider]  Multiple Vitamin (MULTIVITAMIN WITH MINERALS) TABS tablet Take 1 tablet by mouth every other day.    Yes [provider]  traMADol (ULTRAM) 50 MG tablet Take 1 tablet (50 mg total) by mouth every 6 (six) hours as needed for moderate pain. 08/04/19  Yes Burnadette PopAdhikari, Amrit, MD  enoxaparin (LOVENOX) 40 MG/0.4ML injection Inject 0.4 mLs (40 mg total) into the skin daily for 7 days. Patient not taking: Reported on 12/25/2020 03/09/20 03/16/20  Scheeler, Kermit BaloMatthew J, PA-C    Allergies    Codeine  Review of Systems  Review of Systems  Respiratory:  Positive for shortness of breath.   All other systems reviewed and are negative.  Physical Exam Updated Vital Signs BP 140/68   Pulse (!) 41   Temp 98 F (36.7 C) (Oral)   Resp 13   Ht 6\' 1"  (1.854 m)   Wt 95 kg   SpO2 91%   BMI 27.63 kg/m   Physical Exam Vitals and nursing note reviewed.  Constitutional:      General: He is not in acute distress.    Appearance: He is not toxic-appearing.  HENT:     Head: Normocephalic and atraumatic.  Eyes:     Extraocular Movements:  Extraocular movements intact.     Pupils: Pupils are equal, round, and reactive to light.  Neck:     Trachea: No tracheal deviation.  Cardiovascular:     Rate and Rhythm: Regular rhythm. Bradycardia present.     Heart sounds: Murmur heard.    No friction rub. No gallop.  Pulmonary:     Effort: Pulmonary effort is normal.     Breath sounds: Examination of the right-lower field reveals rales. Examination of the left-lower field reveals rales. Wheezing and rales present. No decreased breath sounds.  Chest:     Chest wall: No deformity or tenderness.  Abdominal:     Tenderness: There is no guarding or rebound.     Comments: Distended with apparent ventral hernia, with overlying large 25 x20 cm region of erythema, induration, purulent drainage without underlying fluctuance.  No apparent tenderness to palpation away from the region of this wound.  No rebound, guarding, rigidity.  Musculoskeletal:     Cervical back: Normal range of motion.     Right lower leg: No tenderness. Edema present.     Left lower leg: No tenderness. Edema present.     Comments: Pitting edema to bilateral calves  Skin:    General: Skin is warm and dry.     Capillary Refill: Capillary refill takes less than 2 seconds.  Neurological:     General: No focal deficit present.     Mental Status: He is alert and oriented to person, place, and time.  Psychiatric:        Mood and Affect: Mood is anxious.        Behavior: Behavior is not agitated.    ED Results / Procedures / Treatments   Labs (all labs ordered are listed, but only abnormal results are displayed) Labs Reviewed  CBC WITH DIFFERENTIAL/PLATELET - Abnormal; Notable for the following components:      Result Value   RBC 3.63 (*)    Hemoglobin 12.7 (*)    HCT 38.6 (*)    MCV 106.3 (*)    MCH 35.0 (*)    All other components within normal limits  COMPREHENSIVE METABOLIC PANEL - Abnormal; Notable for the following components:   Sodium 132 (*)    Potassium  5.5 (*)    CO2 17 (*)    BUN 26 (*)    Creatinine, Ser 1.32 (*)    Albumin 3.4 (*)    All other components within normal limits  D-DIMER, QUANTITATIVE - Abnormal; Notable for the following components:   D-Dimer, Quant 2.02 (*)    All other components within normal limits  TROPONIN I (HIGH SENSITIVITY) - Abnormal; Notable for the following components:   Troponin I (High Sensitivity) 44 (*)    All other components within normal limits  TROPONIN I (HIGH SENSITIVITY) - Abnormal; Notable  for the following components:   Troponin I (High Sensitivity) 44 (*)    All other components within normal limits  RESP PANEL BY RT-PCR (FLU A&B, COVID) ARPGX2  SARS CORONAVIRUS 2 (TAT 6-24 HRS)  CULTURE, BLOOD (ROUTINE X 2)  CULTURE, BLOOD (ROUTINE X 2)  LIPASE, BLOOD  BRAIN NATRIURETIC PEPTIDE  HIV ANTIBODY (ROUTINE TESTING W REFLEX)  BASIC METABOLIC PANEL  CBC  LIPID PANEL  LACTIC ACID, PLASMA  LACTIC ACID, PLASMA    EKG EKG Interpretation  Date/Time:  Friday December 25 2020 20:18:11 EDT Ventricular Rate:  43 PR Interval:    QRS Duration: 86 QT Interval:  570 QTC Calculation: 481 R Axis:   87 Text Interpretation:  Critical Test Result: AV Block Sinus tachycardia with complete heart block and Junctional bradycardia Nonspecific ST abnormality Prolonged QT Abnormal ECG Confirmed by Norman Clay ([id number]) on 12/25/2020 8:56:17 PM  Radiology DG Chest 2 View  Result Date: 12/25/2020 CLINICAL DATA:  Initial evaluation for shortness of breath for 2 weeks, fatigue. EXAM: CHEST - 2 VIEW COMPARISON:  Prior radiograph from 08/02/2019 FINDINGS: Cardiomegaly. Mediastinal silhouette within normal limits. Aortic atherosclerosis. Lungs normally inflated. Mild diffuse vascular and interstitial prominence consistent with mild pulmonary interstitial edema. Associated small bilateral pleural effusions, right greater than left. Associated mild bibasilar subsegmental atelectasis. No focal infiltrates. No pneumothorax. No  acute osseous finding. IMPRESSION: 1. Cardiomegaly with mild diffuse pulmonary interstitial edema and small bilateral pleural effusions, right greater than left. 2.  Aortic Atherosclerosis (ICD10-I70.0). Electronically Signed   By: Rise Mu M.D.   On: 12/25/2020 21:20   CT Angio Chest PE W and/or Wo Contrast  Result Date: 12/26/2020 CLINICAL DATA:  Increasing shortness of breath x2 weeks, fatigue, infected abdominal surgical wound with redness and drainage EXAM: CT ANGIOGRAPHY CHEST CT ABDOMEN AND PELVIS WITH CONTRAST TECHNIQUE: Multidetector CT imaging of the chest was performed using the standard protocol during bolus administration of intravenous contrast. Multiplanar CT image reconstructions and MIPs were obtained to evaluate the vascular anatomy. Multidetector CT imaging of the abdomen and pelvis was performed using the standard protocol during bolus administration of intravenous contrast. CONTRAST:  OMNIPAQUE IOHEXOL 350 MG/ML SOLN COMPARISON:  Chest radiographs dated 12/25/2020. CT abdomen/pelvis dated 12/02/2019. FINDINGS: CTA CHEST FINDINGS Cardiovascular: Satisfactory opacification the bilateral pulmonary arteries to the lobar level. Evaluation is mildly limited due to motion degradation. Within that constraint, there is no evidence of pulmonary embolism. Study is not tailored for evaluation of the thoracic aorta. No evidence of thoracic aortic aneurysm. Atherosclerotic calcifications of the aortic arch. Heart is top-normal in size.  No pericardial effusion. Mild three-vessel coronary atherosclerosis. Mediastinum/Nodes: No suspicious mediastinal lymphadenopathy. Visualized thyroid is unremarkable. Lungs/Pleura: Evaluation of the lung parenchyma is constrained by respiratory motion. Within that constraint, there are no suspicious pulmonary nodules. Mild interlobular septal thickening in the lungs bilaterally, lower lobe predominant, suggesting mild interstitial edema. No focal  consolidation. Small to moderate right and small left pleural effusions. Associated lower lobe compressive atelectasis. No pneumothorax. Musculoskeletal: Very mild degenerative changes of the mid thoracic spine. Healing/healed right anterolateral 4th through 7th rib fracture deformities. Review of the MIP images confirms the above findings. CT ABDOMEN and PELVIS FINDINGS Motion degraded images. Hepatobiliary: Liver is within normal limits. Layering gallbladder sludge (series 12/image 36), without associated inflammatory changes. No intrahepatic or extrahepatic ductal dilatation. Pancreas: Within normal limits. Spleen: Within normal limits. Adrenals/Urinary Tract: Adrenal glands are within normal limits. Kidneys are within normal limits.  No hydronephrosis. Stomach/Bowel: Stomach is  within normal limits. 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 There is frank diastasis/laxity of the anterior abdominal wall with herniation of numerous loops of small bowel, which remain nondilated. Persistent overlying cutaneous thickening suggests cellulitis (series 12/image 87). Trace fluid beneath the skin surface, but without drainable fluid collection/abscess. Vascular/Lymphatic: No evidence of abdominal aortic aneurysm. Atherosclerotic calcifications of the abdominal aorta and branch vessels. No suspicious abdominopelvic lymphadenopathy. Reproductive: Prostate is unremarkable. Other: Trace subcutaneous fluid, as noted above, without discrete abdominopelvic ascites. Musculoskeletal: Mild degenerative changes of the lumbar spine. Review of the MIP images confirms the above findings. IMPRESSION: No evidence of pulmonary embolism. Mild interstitial edema. Small to moderate bilateral pleural effusions, right greater than left. Associated lower lobe compressive atelectasis. 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 Overlying skin thickening with trace subcutaneous fluid suggests cellulitis, but without drainable fluid collection/abscess. Electronically Signed   By: Charline Bills M.D.   On: 12/26/2020 00:36   CT ABDOMEN PELVIS W CONTRAST  Result Date: 12/26/2020 CLINICAL DATA:  Increasing shortness of breath x2 weeks, fatigue, infected abdominal surgical wound with redness and drainage EXAM: CT ANGIOGRAPHY CHEST CT ABDOMEN AND PELVIS WITH CONTRAST TECHNIQUE: Multidetector CT imaging of the chest was performed using the standard protocol during bolus administration of intravenous contrast. Multiplanar CT image reconstructions and MIPs were obtained to evaluate the vascular anatomy. Multidetector CT imaging of the abdomen and pelvis was performed using the standard protocol during bolus administration of intravenous contrast. CONTRAST:  OMNIPAQUE IOHEXOL 350 MG/ML SOLN COMPARISON:  Chest radiographs dated 12/25/2020. CT abdomen/pelvis dated 12/02/2019. FINDINGS: CTA CHEST FINDINGS Cardiovascular: Satisfactory opacification the bilateral pulmonary arteries to the lobar level. Evaluation is mildly limited due to motion degradation. Within that constraint, there is no evidence of pulmonary embolism. Study is not tailored for evaluation of the thoracic aorta. No evidence of thoracic aortic aneurysm. Atherosclerotic calcifications of the aortic arch. Heart is top-normal in size.  No pericardial effusion. Mild three-vessel coronary atherosclerosis. Mediastinum/Nodes: No suspicious mediastinal lymphadenopathy. Visualized thyroid is unremarkable. Lungs/Pleura: Evaluation of the lung parenchyma is constrained by respiratory motion. Within that constraint, there are no suspicious pulmonary nodules. Mild interlobular septal thickening in the lungs bilaterally, lower lobe predominant, suggesting mild interstitial edema. No focal consolidation. Small to moderate right  and small left pleural effusions. Associated lower lobe compressive atelectasis. No pneumothorax. Musculoskeletal: Very mild degenerative changes of the mid thoracic spine. Healing/healed right anterolateral 4th through 7th rib fracture deformities. Review of the MIP images confirms the above findings. CT ABDOMEN and PELVIS FINDINGS Motion degraded images. Hepatobiliary: Liver is within normal limits. Layering gallbladder sludge (series 12/image 36), without associated inflammatory changes. No intrahepatic or extrahepatic ductal dilatation. Pancreas: Within normal limits. Spleen: Within normal limits. Adrenals/Urinary Tract: Adrenal glands are within normal limits. Kidneys are within normal limits.  No hydronephrosis. Stomach/Bowel: Stomach is within normal limits. 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 There is frank diastasis/laxity of the anterior abdominal wall with herniation of numerous loops of small bowel, which remain nondilated. Persistent overlying cutaneous thickening suggests cellulitis (series 12/image 87). Trace fluid beneath the skin surface, but without drainable fluid collection/abscess. Vascular/Lymphatic: No evidence of abdominal aortic aneurysm. Atherosclerotic calcifications of the abdominal aorta and branch vessels. No suspicious abdominopelvic lymphadenopathy. Reproductive: Prostate is unremarkable. Other: Trace subcutaneous fluid, as noted above, without discrete abdominopelvic ascites. Musculoskeletal:  Mild degenerative changes of the lumbar spine. Review of the MIP images confirms the above findings. IMPRESSION: No evidence of pulmonary embolism. Mild interstitial edema. Small to moderate bilateral pleural effusions, right greater than left. Associated lower lobe compressive atelectasis. 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 Overlying skin thickening with trace subcutaneous fluid suggests cellulitis, but without drainable fluid collection/abscess. Electronically Signed   By: Charline Bills M.D.   On: 12/26/2020 00:36    Procedures .Critical Care E&M  Date/Time: 12/25/2020 10:34 PM Performed by: Colvin Caroli, MD  Critical care provider statement:    Critical care time (minutes):  30   Critical care time was exclusive of:  Separately billable procedures and treating other patients   Critical care was necessary to treat or prevent imminent or life-threatening deterioration of the following conditions:  Cardiac failure After initial E/M assessment, critical care services were subsequently performed that were exclusive of separately billable procedures or treatment.   Comments:     3rd degree av block   Medications Ordered in ED Medications  aspirin EC tablet 81 mg (has no administration in time range)  nitroGLYCERIN (NITROSTAT) SL tablet 0.4 mg (has no administration in time range)  acetaminophen (TYLENOL) tablet 650 mg (has no administration in time range)  ondansetron (ZOFRAN) injection 4 mg (has no administration in time range)  atorvastatin (LIPITOR) tablet 40 mg (has no administration in time range)  albuterol (PROVENTIL) (2.5 MG/3ML) 0.083% nebulizer solution 10 mg (has no administration in time range)  furosemide (LASIX) injection 40 mg (has no administration in time range)  insulin aspart (novoLOG) injection 5 Units (has no administration in time range)    And  dextrose 50 % solution 50 mL (has no administration in time range)  traMADol (ULTRAM) tablet 50 mg (has no administration in time range)  vancomycin (VANCOREADY) IVPB 1500 mg/300 mL (has no administration in time range)  vancomycin (VANCOCIN) IVPB 1000 mg/200 mL premix (has no administration in time range)  midazolam (VERSED) injection 2 mg (2 mg Intravenous Given 12/25/20 2312)  iohexol (OMNIPAQUE) 350 MG/ML  injection 100 mL (100 mLs Intravenous Contrast Given 12/25/20 2356)    ED Course  I have reviewed the triage vital signs and the nursing notes.  Pertinent labs & imaging results that were available during my care of the patient were reviewed by me and considered in my medical decision making (see chart for details).    MDM Rules/Calculators/A&P                          This is a 60 year old male with history of PE (discontinued Eliquis 3 months ago), diverticulitis with perforation now with ileostomy tube, presenting for shortness of breath found to have high degree AV block and bradycardia in the high 30s low 40s.  Additionally noted to have worsening wound over his ventral hernia, now with purulent drainage.  Noted to have scattered wheezes and bibasilar rales on exam.  Initial interventions: Pads placed and patient was placed on telemetry  DDx included: Arrhythmia, structural abnormality, heart failure, sepsis, PE, ACS, pneumonia, PTX, tamponade, carditis, soft tissue infection  All studies independently reviewed by myself, d/w the attending physician, factored into my MDM. -EKG: Third-degree AV block 43 bpm, normal axis, otherwise normal intervals, no acute ischemic changes; AV block new compared to prior from 07/2019 -CXR: Bibasilar effusions and  cardiomegaly with vascular congestion -CMP: K+ 5.5, CO2 17, creatinine 1.32 (1.08) -Dimer: 07/14/2000 -Troponin: 44->44 -Unremarkable: CBCd, lipase, COVID/influenza PCR -Pending: CT PE study and CT abdomen pelvis with contrast  Presentation most concerning for high degree AV block.  CT PE study pending to evaluate for structural abnormality.  Likely component of heart failure given evidence of fluid overload on exam and imaging.  Patient does not appear toxic, reassuring CBC, although will likely require treatment for his abdominal soft tissue infection.  Imaging pending to evaluate for PE.  Delta troponin unchanged, no evidence of ischemia,  suggesting against ACS and carditis.  No evidence of PTX on x-ray.  Patient is HDS, not displaying tamponade physiology.  Feel patient requires admission for his third-degree AV block.  Discussed with cardiology who agree to admit.  Patient understands agrees with plan.  He was given midazolam due to shortness of breath when lying down for imaging, and subsequently sent to CT scanner.  Patient HDS on reevaluation with persisting bradycardia, and care was transferred to cardiology service.  Final Clinical Impression(s) / ED Diagnoses Final diagnoses:  AV block, 3rd degree [facility])    Rx / DC Orders ED Discharge Orders     None        Colvin Caroli, MD 12/26/20 0146    Cheryll Cockayne, MD 01/03/21 1056

## 2020-12-25 NOTE — ED Provider Notes (Signed)
Emergency Medicine Provider Triage Evaluation Note  Luis [name] , a 60 y.o. male  was evaluated in triage.  Pt complains of shortness of breath x2 weeks.  Patient reports that shortness of breath was worse today after waking up from a nap.  States he feels like he just cannot take a deep breath.  Patient denies any fevers, chills, coughing, swelling, palpitations,   Patient reports that he has not been lasting for days due to nausea.  Patient denies any abdominal pain, vomiting, diarrhea.  Patient also expresses concern for infection to chronic wound on her abdomen.  Patient reports that this morning over the last 2 years.  Patient reports that he has weeping from abdomen no purulent discharge.  Review of Systems  Positive: shob Negative: Chest pain, fever, chills   Physical Exam  Ht 6\' 1"  (1.854 m)   Wt 95 kg   BMI 27.63 kg/m  Gen:   Awake, no distress   Resp:  Normal effort, clear to auscultation bilaterally MSK:   Moves extremities without difficulty  Other:  Abdomen covered by bandages and Ace wrap.  Erythema noted on edges of bandages   Medical Decision Making  Medically screening exam initiated at 8:21 PM.  Appropriate orders placed.  Luis [name] was informed that the remainder of the evaluation will be completed by another provider, this initial triage assessment does not replace that evaluation, and the importance of remaining in the ED until their evaluation is complete.  EKG shows complete heart block.  Patient will be moved back to next available   [name] 12/25/20 2025    12/27/20, MD 12/25/20 [phone]

## 2020-12-25 NOTE — ED Triage Notes (Signed)
Patient reports increasing SOB for 2 weeks with fatigue , denies chest pain , no fever or chills , patient also concerned with infected surgical wound at abdomen with redness and drainage .

## 2020-12-26 ENCOUNTER — Other Ambulatory Visit (HOSPITAL_COMMUNITY): Payer: 59

## 2020-12-26 ENCOUNTER — Inpatient Hospital Stay (HOSPITAL_COMMUNITY): Payer: 59

## 2020-12-26 ENCOUNTER — Encounter (HOSPITAL_COMMUNITY): Payer: Self-pay | Admitting: Internal Medicine

## 2020-12-26 DIAGNOSIS — F17200 Nicotine dependence, unspecified, uncomplicated: Secondary | ICD-10-CM | POA: Diagnosis present

## 2020-12-26 DIAGNOSIS — I442 Atrioventricular block, complete: Secondary | ICD-10-CM

## 2020-12-26 DIAGNOSIS — K439 Ventral hernia without obstruction or gangrene: Secondary | ICD-10-CM | POA: Diagnosis not present

## 2020-12-26 DIAGNOSIS — Z79891 Long term (current) use of opiate analgesic: Secondary | ICD-10-CM | POA: Diagnosis not present

## 2020-12-26 DIAGNOSIS — I119 Hypertensive heart disease without heart failure: Secondary | ICD-10-CM | POA: Diagnosis not present

## 2020-12-26 DIAGNOSIS — Z933 Colostomy status: Secondary | ICD-10-CM | POA: Diagnosis not present

## 2020-12-26 DIAGNOSIS — F10230 Alcohol dependence with withdrawal, uncomplicated: Secondary | ICD-10-CM | POA: Diagnosis present

## 2020-12-26 DIAGNOSIS — Z7984 Long term (current) use of oral hypoglycemic drugs: Secondary | ICD-10-CM | POA: Diagnosis not present

## 2020-12-26 DIAGNOSIS — Z79899 Other long term (current) drug therapy: Secondary | ICD-10-CM | POA: Diagnosis not present

## 2020-12-26 DIAGNOSIS — Z885 Allergy status to narcotic agent status: Secondary | ICD-10-CM | POA: Diagnosis not present

## 2020-12-26 DIAGNOSIS — E876 Hypokalemia: Secondary | ICD-10-CM

## 2020-12-26 DIAGNOSIS — Z20822 Contact with and (suspected) exposure to covid-19: Secondary | ICD-10-CM | POA: Diagnosis not present

## 2020-12-26 DIAGNOSIS — E785 Hyperlipidemia, unspecified: Secondary | ICD-10-CM | POA: Diagnosis not present

## 2020-12-26 DIAGNOSIS — Z7141 Alcohol abuse counseling and surveillance of alcoholic: Secondary | ICD-10-CM | POA: Diagnosis not present

## 2020-12-26 DIAGNOSIS — I4430 Unspecified atrioventricular block: Secondary | ICD-10-CM

## 2020-12-26 DIAGNOSIS — R0609 Other forms of dyspnea: Secondary | ICD-10-CM | POA: Diagnosis not present

## 2020-12-26 DIAGNOSIS — F17210 Nicotine dependence, cigarettes, uncomplicated: Secondary | ICD-10-CM | POA: Diagnosis not present

## 2020-12-26 DIAGNOSIS — L089 Local infection of the skin and subcutaneous tissue, unspecified: Secondary | ICD-10-CM | POA: Diagnosis not present

## 2020-12-26 DIAGNOSIS — E119 Type 2 diabetes mellitus without complications: Secondary | ICD-10-CM | POA: Diagnosis not present

## 2020-12-26 DIAGNOSIS — R001 Bradycardia, unspecified: Secondary | ICD-10-CM | POA: Diagnosis present

## 2020-12-26 DIAGNOSIS — Z006 Encounter for examination for normal comparison and control in clinical research program: Secondary | ICD-10-CM | POA: Diagnosis not present

## 2020-12-26 DIAGNOSIS — Z932 Ileostomy status: Secondary | ICD-10-CM | POA: Diagnosis not present

## 2020-12-26 DIAGNOSIS — F419 Anxiety disorder, unspecified: Secondary | ICD-10-CM | POA: Diagnosis not present

## 2020-12-26 DIAGNOSIS — S31109A Unspecified open wound of abdominal wall, unspecified quadrant without penetration into peritoneal cavity, initial encounter: Secondary | ICD-10-CM | POA: Diagnosis not present

## 2020-12-26 DIAGNOSIS — S31109D Unspecified open wound of abdominal wall, unspecified quadrant without penetration into peritoneal cavity, subsequent encounter: Secondary | ICD-10-CM | POA: Diagnosis not present

## 2020-12-26 DIAGNOSIS — Z9049 Acquired absence of other specified parts of digestive tract: Secondary | ICD-10-CM | POA: Diagnosis not present

## 2020-12-26 DIAGNOSIS — Z22322 Carrier or suspected carrier of Methicillin resistant Staphylococcus aureus: Secondary | ICD-10-CM | POA: Diagnosis not present

## 2020-12-26 DIAGNOSIS — E875 Hyperkalemia: Secondary | ICD-10-CM | POA: Diagnosis not present

## 2020-12-26 DIAGNOSIS — I514 Myocarditis, unspecified: Secondary | ICD-10-CM | POA: Diagnosis not present

## 2020-12-26 DIAGNOSIS — I10 Essential (primary) hypertension: Secondary | ICD-10-CM | POA: Diagnosis present

## 2020-12-26 DIAGNOSIS — Z86718 Personal history of other venous thrombosis and embolism: Secondary | ICD-10-CM | POA: Diagnosis not present

## 2020-12-26 DIAGNOSIS — Z716 Tobacco abuse counseling: Secondary | ICD-10-CM | POA: Diagnosis not present

## 2020-12-26 DIAGNOSIS — Z950 Presence of cardiac pacemaker: Secondary | ICD-10-CM | POA: Diagnosis not present

## 2020-12-26 LAB — CBC
HCT: 37.7 % — ABNORMAL LOW (ref 39.0–52.0)
Hemoglobin: 12.7 g/dL — ABNORMAL LOW (ref 13.0–17.0)
MCH: 34.9 pg — ABNORMAL HIGH (ref 26.0–34.0)
MCHC: 33.7 g/dL (ref 30.0–36.0)
MCV: 103.6 fL — ABNORMAL HIGH (ref 80.0–100.0)
Platelets: 296 10*3/uL (ref 150–400)
RBC: 3.64 MIL/uL — ABNORMAL LOW (ref 4.22–5.81)
RDW: 12.9 % (ref 11.5–15.5)
WBC: 10 10*3/uL (ref 4.0–10.5)
nRBC: 0 % (ref 0.0–0.2)

## 2020-12-26 LAB — GLUCOSE, CAPILLARY
Glucose-Capillary: 110 mg/dL — ABNORMAL HIGH (ref 70–99)
Glucose-Capillary: 141 mg/dL — ABNORMAL HIGH (ref 70–99)
Glucose-Capillary: 129 mg/dL — ABNORMAL HIGH (ref 70–99)
Glucose-Capillary: 109 mg/dL — ABNORMAL HIGH (ref 70–99)

## 2020-12-26 LAB — BASIC METABOLIC PANEL
Anion gap: 14 (ref 5–15)
BUN: 28 mg/dL — ABNORMAL HIGH (ref 8–23)
CO2: 15 mmol/L — ABNORMAL LOW (ref 22–32)
Calcium: 8.7 mg/dL — ABNORMAL LOW (ref 8.9–10.3)
Chloride: 103 mmol/L (ref 98–111)
Creatinine, Ser: 1.26 mg/dL — ABNORMAL HIGH (ref 0.61–1.24)
GFR, Estimated: >60 mL/min (ref >60)
Glucose, Bld: 87 mg/dL (ref 70–99)
Potassium: 5.1 mmol/L (ref 3.5–5.1)
Sodium: 132 mmol/L — ABNORMAL LOW (ref 135–145)

## 2020-12-26 LAB — LACTIC ACID, PLASMA
Lactic Acid, Venous: 2.1 mmol/L (ref 0.5–1.9)
Lactic Acid, Venous: 2.5 mmol/L (ref 0.5–1.9)
Lactic Acid, Venous: 2.5 mmol/L (ref 0.5–1.9)
Lactic Acid, Venous: 2.2 mmol/L (ref 0.5–1.9)
Lactic Acid, Venous: 2.1 mmol/L (ref 0.5–1.9)

## 2020-12-26 LAB — ECHOCARDIOGRAM COMPLETE
AV Mean grad: 22 mmHg
AV Peak grad: 38.6 mmHg
Ao pk vel: 3.11 m/s
Area-P 1/2: 3.99 cm2
Height: 73 in
S' Lateral: 3 cm
Weight: 3488.56 oz

## 2020-12-26 LAB — HIV ANTIBODY (ROUTINE TESTING W REFLEX): HIV Screen 4th Generation wRfx: NONREACTIVE

## 2020-12-26 LAB — LIPID PANEL
Cholesterol: 140 mg/dL (ref 0–200)
HDL: 48 mg/dL (ref >40)
LDL Cholesterol: 53 mg/dL (ref 0–99)
Total CHOL/HDL Ratio: 2.9 RATIO
Triglycerides: 196 mg/dL — ABNORMAL HIGH (ref <150)
VLDL: 39 mg/dL (ref 0–40)

## 2020-12-26 LAB — PHOSPHORUS: Phosphorus: 5 mg/dL — ABNORMAL HIGH (ref 2.5–4.6)

## 2020-12-26 LAB — MAGNESIUM: Magnesium: 2.1 mg/dL (ref 1.7–2.4)

## 2020-12-26 LAB — CK: Total CK: 112 U/L (ref 49–397)

## 2020-12-26 LAB — HEMOGLOBIN A1C
Hgb A1c MFr Bld: 5.6 % (ref 4.8–5.6)
Mean Plasma Glucose: 114.02 mg/dL

## 2020-12-26 LAB — BRAIN NATRIURETIC PEPTIDE: B Natriuretic Peptide: 1333.9 pg/mL — ABNORMAL HIGH (ref 0.0–100.0)

## 2020-12-26 MED ORDER — ONDANSETRON HCL 4 MG/2ML IJ SOLN: 4.0000 mg | Freq: Four times a day (QID) | INTRAMUSCULAR | Status: DC | PRN

## 2020-12-26 MED ORDER — INSULIN ASPART 100 UNIT/ML IJ SOLN
0.0000 [IU] | Freq: Every day | INTRAMUSCULAR | Status: DC
Start: 2020-12-26 — End: 2020-12-30

## 2020-12-26 MED ORDER — TRAMADOL HCL 50 MG PO TABS
50.0000 mg | ORAL_TABLET | Freq: Four times a day (QID) | ORAL | Status: DC | PRN
  Administered 2020-12-26 – 2021-01-01 (×18): 50 mg via ORAL
  Filled 2020-12-26 (×18): qty 1

## 2020-12-26 MED ORDER — INSULIN ASPART 100 UNIT/ML IJ SOLN
0.0000 [IU] | Freq: Three times a day (TID) | INTRAMUSCULAR | Status: DC
Start: 2020-12-26 — End: 2020-12-30
  Administered 2020-12-26 – 2020-12-30 (×2): 2 [IU] via SUBCUTANEOUS

## 2020-12-26 MED ORDER — ENOXAPARIN SODIUM 40 MG/0.4ML IJ SOSY
40.0000 mg | PREFILLED_SYRINGE | INTRAMUSCULAR | Status: DC
  Administered 2020-12-26 – 2020-12-30 (×5): 40 mg via SUBCUTANEOUS
  Filled 2020-12-26 (×5): qty 0.4

## 2020-12-26 MED ORDER — THIAMINE HCL 100 MG/ML IJ SOLN
100.0000 mg | Freq: Every day | INTRAMUSCULAR | Status: DC
  Filled 2020-12-26: qty 2

## 2020-12-26 MED ORDER — SODIUM CHLORIDE 0.9 % IV SOLN
500.0000 mg | Freq: Every day | INTRAVENOUS | Status: DC
  Filled 2020-12-26: qty 10

## 2020-12-26 MED ORDER — PERFLUTREN LIPID MICROSPHERE
1.0000 mL | INTRAVENOUS | Status: AC | PRN
  Administered 2020-12-26: 2 mL via INTRAVENOUS
  Filled 2020-12-26: qty 10

## 2020-12-26 MED ORDER — LORAZEPAM 1 MG PO TABS
1.0000 mg | ORAL_TABLET | ORAL | Status: AC | PRN
  Administered 2020-12-27 – 2020-12-28 (×2): 1 mg via ORAL
  Filled 2020-12-26: qty 1

## 2020-12-26 MED ORDER — ASPIRIN EC 81 MG PO TBEC
81.0000 mg | DELAYED_RELEASE_TABLET | Freq: Every day | ORAL | Status: DC
  Administered 2020-12-27 – 2021-01-01 (×6): 81 mg via ORAL
  Filled 2020-12-26 (×6): qty 1

## 2020-12-26 MED ORDER — ATORVASTATIN CALCIUM 40 MG PO TABS
40.0000 mg | ORAL_TABLET | Freq: Every day | ORAL | Status: DC
  Administered 2020-12-26: 40 mg via ORAL
  Filled 2020-12-26: qty 1

## 2020-12-26 MED ORDER — FOLIC ACID 1 MG PO TABS
1.0000 mg | ORAL_TABLET | Freq: Every day | ORAL | Status: DC
  Administered 2020-12-26 – 2021-01-01 (×7): 1 mg via ORAL
  Filled 2020-12-26 (×7): qty 1

## 2020-12-26 MED ORDER — FUROSEMIDE 10 MG/ML IJ SOLN
60.0000 mg | Freq: Once | INTRAMUSCULAR | Status: AC
  Administered 2020-12-26: 60 mg via INTRAVENOUS
  Filled 2020-12-26: qty 6

## 2020-12-26 MED ORDER — DEXTROSE 50 % IV SOLN
1.0000 | Freq: Once | INTRAVENOUS | Status: AC
  Administered 2020-12-26: 50 mL via INTRAVENOUS
  Filled 2020-12-26: qty 50

## 2020-12-26 MED ORDER — DOPAMINE-DEXTROSE 3.2-5 MG/ML-% IV SOLN: 2.5000 ug/kg/min | INTRAVENOUS | Status: DC

## 2020-12-26 MED ORDER — ACETAMINOPHEN 325 MG PO TABS
650.0000 mg | ORAL_TABLET | ORAL | Status: DC | PRN
  Administered 2020-12-26 – 2020-12-31 (×17): 650 mg via ORAL
  Filled 2020-12-26 (×17): qty 2

## 2020-12-26 MED ORDER — SODIUM CHLORIDE 0.9 % IV SOLN
800.0000 mg | Freq: Every day | INTRAVENOUS | Status: DC
  Administered 2020-12-26 – 2020-12-28 (×3): 800 mg via INTRAVENOUS
  Filled 2020-12-26 (×5): qty 16

## 2020-12-26 MED ORDER — LORAZEPAM 2 MG/ML IJ SOLN
1.0000 mg | INTRAMUSCULAR | Status: AC | PRN
  Administered 2020-12-27: 1 mg via INTRAVENOUS
  Administered 2020-12-28: 2 mg via INTRAVENOUS
  Filled 2020-12-26 (×4): qty 1

## 2020-12-26 MED ORDER — THIAMINE HCL 100 MG PO TABS
100.0000 mg | ORAL_TABLET | Freq: Every day | ORAL | Status: DC
  Administered 2020-12-26 – 2021-01-01 (×7): 100 mg via ORAL
  Filled 2020-12-26 (×7): qty 1

## 2020-12-26 MED ORDER — ORAL CARE MOUTH RINSE
15.0000 mL | Freq: Two times a day (BID) | OROMUCOSAL | Status: DC
  Administered 2020-12-26 – 2020-12-31 (×9): 15 mL via OROMUCOSAL

## 2020-12-26 MED ORDER — PIPERACILLIN-TAZOBACTAM 3.375 G IVPB
3.3750 g | Freq: Three times a day (TID) | INTRAVENOUS | Status: DC
  Administered 2020-12-26 – 2020-12-29 (×9): 3.375 g via INTRAVENOUS
  Filled 2020-12-26 (×13): qty 50

## 2020-12-26 MED ORDER — ADULT MULTIVITAMIN W/MINERALS CH
1.0000 | ORAL_TABLET | Freq: Every day | ORAL | Status: DC
  Administered 2020-12-26 – 2021-01-01 (×7): 1 via ORAL
  Filled 2020-12-26 (×7): qty 1

## 2020-12-26 MED ORDER — INSULIN ASPART 100 UNIT/ML IV SOLN
5.0000 [IU] | Freq: Once | INTRAVENOUS | Status: AC
  Administered 2020-12-26: 5 [IU] via INTRAVENOUS

## 2020-12-26 MED ORDER — VANCOMYCIN HCL IN DEXTROSE 1-5 GM/200ML-% IV SOLN
1000.0000 mg | Freq: Two times a day (BID) | INTRAVENOUS | Status: DC
  Administered 2020-12-26: 1000 mg via INTRAVENOUS
  Filled 2020-12-26: qty 200

## 2020-12-26 MED ORDER — NITROGLYCERIN 0.4 MG SL SUBL: 0.4000 mg | SUBLINGUAL_TABLET | SUBLINGUAL | Status: DC | PRN

## 2020-12-26 MED ORDER — ALBUTEROL SULFATE (2.5 MG/3ML) 0.083% IN NEBU
10.0000 mg | INHALATION_SOLUTION | Freq: Once | RESPIRATORY_TRACT | Status: AC
  Administered 2020-12-26: 10 mg via RESPIRATORY_TRACT
  Filled 2020-12-26 (×2): qty 12

## 2020-12-26 MED ORDER — FUROSEMIDE 10 MG/ML IJ SOLN
40.0000 mg | Freq: Once | INTRAMUSCULAR | Status: AC
  Administered 2020-12-26: 40 mg via INTRAVENOUS
  Filled 2020-12-26: qty 4

## 2020-12-26 MED ORDER — VANCOMYCIN HCL 1500 MG/300ML IV SOLN
1500.0000 mg | Freq: Once | INTRAVENOUS | Status: AC
  Administered 2020-12-26: 1500 mg via INTRAVENOUS
  Filled 2020-12-26: qty 300

## 2020-12-26 MED ORDER — NICOTINE 21 MG/24HR TD PT24
21.0000 mg | MEDICATED_PATCH | Freq: Every day | TRANSDERMAL | Status: DC
  Filled 2020-12-26 (×3): qty 1

## 2020-12-26 NOTE — H&P (Signed)
Cardiology Admission History and Physical:   Patient ID: Luis [name] MRN: [id number]; DOB: January 21, 1961   Admission date: 12/25/2020  PCP:  Henrine Screws, MD   [facility] Providers Cardiologist:  None        Chief Complaint:  SOB  Patient Profile:   Luis [name] is a 60 y.o. male with pmh sx for DM, HTN, diverticulitis with perforation, had colectomy and ileostomy, multiple wound infections and superficial fistula recently who is being seen 12/26/2020 for the evaluation of high grade AV block  History of Present Illness:   Luis [name] s a 60 y.o. male with pmh sx for DM, HTN, diverticulitis with perforation, had colectomy and ileostomy, multiple wound infections and superficial fistula recently who is being seen 12/26/2020 for the evaluation of high grade AV block. He has been having some SOB since the past 2 weeks. He denies any chest pain. Also complains about abdomen wound issues he has been having since some time. In the ED, he was found to have HR of 40s and high grade AV block. Hemodynamically stable. No syncope or dizziness. Has severe anxiety. K=5.5 and Cr=1.5. No fevers as such. Mild leukocytosis. He says he might have taken wrong medications; however says he took metoprolol 25 only.    Past Medical History:  Diagnosis Date   Diabetes mellitus without complication (HCC)    Diverticulitis    DVT (deep venous thrombosis) (HCC)    Hypertension     Past Surgical History:  Procedure Laterality Date   COLOSTOMY     TONSILLECTOMY       Medications Prior to Admission: Prior to Admission medications   Medication Sig Start Date End Date Taking? Authorizing Provider  acetaminophen (TYLENOL) 650 MG CR tablet Take 650 mg by mouth every 8 (eight) hours as needed for pain.    Yes [provider]  amLODipine (NORVASC) 10 MG tablet Take 10 mg by mouth daily.   Yes [provider]  atorvastatin (LIPITOR) 10 MG tablet Take 10 mg by mouth daily. 11/20/20  Yes  [provider]  ibuprofen (ADVIL) 200 MG tablet Take 200 mg by mouth every 6 (six) hours as needed for headache or moderate pain.   Yes [provider]  metFORMIN (GLUCOPHAGE) 1000 MG tablet Take 500 mg by mouth daily.    Yes [provider]  metoprolol succinate (TOPROL-XL) 25 MG 24 hr tablet Take 25 mg by mouth daily. 12/20/20  Yes [provider]  Multiple Vitamin (MULTIVITAMIN WITH MINERALS) TABS tablet Take 1 tablet by mouth every other day.    Yes [provider]  traMADol (ULTRAM) 50 MG tablet Take 1 tablet (50 mg total) by mouth every 6 (six) hours as needed for moderate pain. 08/04/19  Yes Burnadette Pop, MD  enoxaparin (LOVENOX) 40 MG/0.4ML injection Inject 0.4 mLs (40 mg total) into the skin daily for 7 days. Patient not taking: Reported on 12/25/2020 03/09/20 03/16/20  Scheeler, Kermit Balo, PA-C     Allergies:    Allergies  Allergen Reactions   Codeine Other (See Comments)    unknown    Social History:   Social History   Socioeconomic History   Marital status: Single    Spouse name: Not on file   Number of children: Not on file   Years of education: Not on file   Highest education level: Not on file  Occupational History   Not on file  Tobacco Use   Smoking status: Every Day    Packs/day:  1.00    Years: 44.00    Pack years: 44.00    Types: Cigarettes   Smokeless tobacco: Current  Substance and Sexual Activity   Alcohol use: Yes   Drug use: Yes    Types: Marijuana   Sexual activity: Not on file  Other Topics Concern   Not on file  Social History Narrative   Not on file   Social Determinants of Health   Financial Resource Strain: Not on file  Food Insecurity: Not on file  Transportation Needs: Not on file  Physical Activity: Not on file  Stress: Not on file  Social Connections: Not on file  Intimate Partner Violence: Not on file    Family History:  No family history of sudden cardiac deaths  ROS:  Please see  the history of present illness.  All other ROS reviewed and negative.     Physical Exam/Data:   Vitals:   12/25/20 2206 12/25/20 2315 12/25/20 2345 12/26/20 0000  BP: (!) 144/68 (!) 149/65 (!) 157/69 140/68  Pulse: (!) 38 (!) 41 (!) 36 (!) 41  Resp: 20 (!) 21 13   Temp:      TempSrc:      SpO2: 94% 91% 97% 91%  Weight:      Height:       No intake or output data in the 24 hours ending 12/26/20 0036 Last 3 Weights 12/25/2020 01/27/2020 11/26/2019  Weight (lbs) 209 lb 7 oz 177 lb 185 lb 12.8 oz  Weight (kg) 95 kg 80.287 kg 84.278 kg     Body mass index is 27.63 kg/m.  General:  Well nourished, well developed, seems anxious HEENT: normal Lymph: no adenopathy Neck: no JVD Endocrine:  No thryomegaly Vascular: No carotid bruits; FA pulses 2+ bilaterally without bruits  Cardiac:  normal S1, S2; RRR; no murmur; bradycardic Lungs:  Poor airflow movement in lungs Abd: Colostomy bag. Large dressing on wound; redness noted.  Ext: Mild pitting edema Musculoskeletal:  No deformities, BUE and BLE strength normal and equal Skin: warm and dry  Neuro:  CNs 2-12 intact, no focal abnormalities noted Psych:  Normal affect    EKG:  The ECG that was done showed high grade AV block   Laboratory Data:  High Sensitivity Troponin:   Recent Labs  Lab 12/25/20 2024 12/25/20 2224  TROPONINIHS 44* 44*      Chemistry Recent Labs  Lab 12/25/20 2024  NA 132*  K 5.5*  CL 103  CO2 17*  GLUCOSE 99  BUN 26*  CREATININE 1.32*  CALCIUM 8.9  GFRNONAA >60  ANIONGAP 12    Recent Labs  Lab 12/25/20 2024  PROT 7.4  ALBUMIN 3.4*  AST 33  ALT 29  ALKPHOS 124  BILITOT 0.9   Hematology Recent Labs  Lab 12/25/20 2024  WBC 10.1  RBC 3.63*  HGB 12.7*  HCT 38.6*  MCV 106.3*  MCH 35.0*  MCHC 32.9  RDW 12.8  PLT 320   BNPNo results for input(s): BNP, PROBNP in the last 168 hours.  DDimer  Recent Labs  Lab 12/25/20 2024  DDIMER 2.02*     Radiology/Studies:  DG Chest 2  View  Result Date: 12/25/2020 CLINICAL DATA:  Initial evaluation for shortness of breath for 2 weeks, fatigue. EXAM: CHEST - 2 VIEW COMPARISON:  Prior radiograph from 08/02/2019 FINDINGS: Cardiomegaly. Mediastinal silhouette within normal limits. Aortic atherosclerosis. Lungs normally inflated. Mild diffuse vascular and interstitial prominence consistent with mild pulmonary interstitial edema. Associated small bilateral pleural  effusions, right greater than left. Associated mild bibasilar subsegmental atelectasis. No focal infiltrates. No pneumothorax. No acute osseous finding. IMPRESSION: 1. Cardiomegaly with mild diffuse pulmonary interstitial edema and small bilateral pleural effusions, right greater than left. 2.  Aortic Atherosclerosis (ICD10-I70.0). Electronically Signed   By: Rise Mu M.D.   On: 12/25/2020 21:20     Assessment and Plan:   # High grade AV block  -Hemodynamically stable. Pacing Pads on- monitor closely -Hold metoprolol and amlodipine. Unlikely overdose -Unsure why he has AV block; no ischemic signs. No CP. No previous EKG -Discussed with interventional attending on call; no need for temp wire for now considering he is stable -EP consult in AM for PPM; however situation complex due to possible wound infection in abdomen. -If becomes unstable, will activate cath lab for wire and will start dopamine or epi drip. However seems to have a stable junctional rhythm.    # Wound infection-possible -Get Blood cultures -Vancomycin - General Surgery/ID consult in AM for his extensive surgical history and wound infections.   # Hyperkalemia: Shift and IV Lasix. Repeat K # HTN: Hold home meds. Monitor # DM: Insulin SSS per protocol   For questions or updates, please contact [facility] Please consult [email or web address] for contact info under     Signed, Hermelinda Dellen, MD  12/26/2020 12:36 AM

## 2020-12-26 NOTE — Consult Note (Addendum)
Luis [name] June 22, 1960  [id number].    Requesting MD: Dr. Gala Romney Chief Complaint/Reason for Consult: Ventral hernia with skin changes  HPI: Luis [name] is a 60 y.o. male with a hx of DM2, DVT, HTN, HLD, tobacco dependence, alcohol dependence who was admitted for a complete heart block. 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 Previously followed by Dr. Arita Miss and Dr. [name] for this.  He was previously scheduled for skin graft surgery with Dr. Arita Miss but he canceled several times.  He is referred to [facility] but has not followed up with them.  He reports he has not seen Dr. Arita Miss or Dr. [name] for several months.  Previously was having his wound managed by a wound VAC and then changed to Xeroform dressings with the assistance of home health.  He has not had home health since last year.  He is managing this at home with daily xeroform dressing changes. He reports the area of what appears to be hypergranulation tissue has improved recently (decreased in size). He reports scattered patches of dark red around his wound that have been present for months and unchanged. No recent change to his baseline abdominal pain. He is tolerating a diet prior to admission without n/v. He is having ostomy output. He denies fevers at home.  ROS: Review of Systems  Constitutional:  Negative for chills and fever.  Respiratory:  Positive for shortness of breath. Negative for cough.   Cardiovascular:  Negative for chest pain.  Gastrointestinal:  Positive for abdominal pain and nausea (currently). Negative for constipation, diarrhea and vomiting.  All other systems reviewed and are negative.  Family History  Problem Relation Age of Onset   COPD Mother    Prostate cancer Father     Past Medical History:  Diagnosis Date   Diabetes mellitus without complication (HCC)    Diverticulitis     DVT (deep venous thrombosis) (HCC)    ETOH abuse    H/O colectomy    Hyperlipidemia    Hypertension    Smoker     Past Surgical History:  Procedure Laterality Date   COLOSTOMY     TONSILLECTOMY      Social History:  reports that he has been smoking cigarettes. He has a 44.00 pack-year smoking history. He uses smokeless tobacco. He reports current alcohol use of about 5.0 standard drinks of alcohol per week. He reports current drug use. Drug: Marijuana.  Allergies:  Allergies  Allergen Reactions   Codeine Other (See Comments)    unknown    Medications Prior to Admission  Medication Sig Dispense Refill   acetaminophen (TYLENOL) 650 MG CR tablet Take 650 mg by mouth every 8 (eight) hours as needed for pain.      amLODipine (NORVASC) 10 MG tablet Take 10 mg by mouth daily.     atorvastatin (LIPITOR) 10 MG tablet Take 10 mg by mouth daily.     ibuprofen (ADVIL) 200 MG tablet Take 200 mg by mouth every 6 (six) hours as needed for headache or moderate pain.     metFORMIN (GLUCOPHAGE) 1000 MG tablet Take 500 mg by mouth daily.      metoprolol succinate (TOPROL-XL) 25 MG 24 hr tablet Take 25 mg by mouth daily.     Multiple Vitamin (MULTIVITAMIN WITH MINERALS) TABS tablet Take 1 tablet by mouth every other day.      traMADol Janean Sark)  50 MG tablet Take 1 tablet (50 mg total) by mouth every 6 (six) hours as needed for moderate pain. 30 tablet 0   enoxaparin (LOVENOX) 40 MG/0.4ML injection Inject 0.4 mLs (40 mg total) into the skin daily for 7 days. (Patient not taking: Reported on 12/25/2020) 2.8 mL 0     Physical Exam: Blood pressure (!) 142/72, pulse (!) 41, temperature 98.8 F (37.1 C), temperature source Oral, resp. rate 20, height 6\' 1"  (1.854 m), weight 98.9 kg, SpO2 96 %. General: WD/WN white male who is laying in bed in NAD HEENT: head is normocephalic, atraumatic.  Sclera are noninjected.  PERRL.  Ears and nose without any masses or lesions.  Mouth is pink and moist. Dentition  fair Heart: Bradycardic with irregular rhythm. Palpable pedal pulses bilaterally  Lungs: CTAB, no wheezes, rhonchi, or rales noted.  Respiratory effort nonlabored Abd: Soft, +BS, ostomy bag with stool in bag. Unable to visualize stoma. Large ventral hernia with hypergranulation tissue as noted below. Surrounding is scaly blanchable erythematous patches extending from waist line to lower chest. There is no heat, induration or fluctuance. No drainage.  MS: no BUE edema. Trace LE edema b/l. Calves soft and nontender Skin: As noted above. Otherwise warm and dry with no masses, lesions, or rashes Psych: A&Ox4 with an appropriate affect Neuro: cranial nerves grossly intact, equal strength in BUE/BLE bilaterally, normal speech, thought process intact, moves all extremities, gait not assessed       Results for orders placed or performed during the hospital encounter of 12/25/20 (from the past 48 hour(s))  CBC with Differential     Status: Abnormal   Collection Time: 12/25/20  8:24 PM  Result Value Ref Range   WBC 10.1 4.0 - 10.5 K/uL   RBC 3.63 (L) 4.22 - 5.81 MIL/uL   Hemoglobin 12.7 (L) 13.0 - 17.0 g/dL   HCT 12/27/20 (L) 94.1 - 74.0 %   MCV 106.3 (H) 80.0 - 100.0 fL   MCH 35.0 (H) 26.0 - 34.0 pg   MCHC 32.9 30.0 - 36.0 g/dL   RDW 81.4 48.1 - 85.6 %   Platelets 320 150 - 400 K/uL   nRBC 0.0 0.0 - 0.2 %   Neutrophils Relative % 71 %   Neutro Abs 7.3 1.7 - 7.7 K/uL   Lymphocytes Relative 16 %   Lymphs Abs 1.6 0.7 - 4.0 K/uL   Monocytes Relative 10 %   Monocytes Absolute 1.0 0.1 - 1.0 K/uL   Eosinophils Relative 1 %   Eosinophils Absolute 0.1 0.0 - 0.5 K/uL   Basophils Relative 1 %   Basophils Absolute 0.1 0.0 - 0.1 K/uL   Immature Granulocytes 1 %   Abs Immature Granulocytes 0.05 0.00 - 0.07 K/uL    Comment: Performed at [facility], [address] [place], [place]  Comprehensive metabolic panel     Status: Abnormal   Collection Time: 12/25/20  8:24 PM  Result Value  Ref Range   Sodium 132 (L) 135 - 145 mmol/L   Potassium 5.5 (H) 3.5 - 5.1 mmol/L   Chloride 103 98 - 111 mmol/L   CO2 17 (L) 22 - 32 mmol/L   Glucose, Bld 99 70 - 99 mg/dL    Comment: Glucose reference range applies only to samples taken after fasting for at least 8 hours.   BUN 26 (H) 8 - 23 mg/dL   Creatinine, Ser 12/27/20 (H) 0.61 - 1.24 mg/dL   Calcium 8.9 8.9 - 10.3  mg/dL   Total Protein 7.4 6.5 - 8.1 g/dL   Albumin 3.4 (L) 3.5 - 5.0 g/dL   AST 33 15 - 41 U/L   ALT 29 0 - 44 U/L   Alkaline Phosphatase 124 38 - 126 U/L   Total Bilirubin 0.9 0.3 - 1.2 mg/dL   GFR, Estimated >16 >10 mL/min    Comment: (NOTE) Calculated using the CKD-EPI Creatinine Equation (2021)    Anion gap 12 5 - 15    Comment: Performed at [facility], [address] [place], [place] [address]  Lipase, blood     Status: None   Collection Time: 12/25/20  8:24 PM  Result Value Ref Range   Lipase 46 11 - 51 U/L    Comment: Performed at [facility], [address], [place], [place] [address]  D-dimer, quantitative     Status: Abnormal   Collection Time: 12/25/20  8:24 PM  Result Value Ref Range   D-Dimer, Quant 2.02 (H) 0.00 - 0.50 ug/mL-FEU    Comment: (NOTE) At the manufacturer cut-off value of 0.5 g/mL FEU, this assay has a negative predictive value of 95-100%.This assay is intended for use in conjunction with a clinical pretest probability (PTP) assessment model to exclude pulmonary embolism (PE) and deep venous thrombosis (DVT) in outpatients suspected of PE or DVT. Results should be correlated with clinical presentation. Performed at [facility], [address] [place], [place] [address]   Troponin I (High Sensitivity)     Status: Abnormal   Collection Time: 12/25/20  8:24 PM  Result Value Ref Range   Troponin I (High Sensitivity) 44 (H) <18 ng/L    Comment: (NOTE) Elevated high sensitivity troponin I (hsTnI) values and significant  changes across serial measurements may  suggest ACS but many other  chronic and acute conditions are known to elevate hsTnI results.  Refer to the "Links" section for chest pain algorithms and additional  guidance. Performed at [facility], [address], [place], [place] [address]   Resp Panel by RT-PCR (Flu A&B, Covid) Nasopharyngeal Swab     Status: None   Collection Time: 12/25/20  9:35 PM   Specimen: Nasopharyngeal Swab; Nasopharyngeal(NP) swabs in vial transport medium  Result Value Ref Range   SARS Coronavirus 2 by RT PCR NEGATIVE NEGATIVE    Comment: (NOTE) SARS-CoV-2 target nucleic acids are NOT DETECTED.  The SARS-CoV-2 RNA is generally detectable in upper respiratory specimens during the acute phase of infection. The lowest concentration of SARS-CoV-2 viral copies this assay can detect is 138 copies/mL. A negative result does not preclude SARS-Cov-2 infection and should not be used as the sole basis for treatment or other patient management decisions. A negative result may occur with  improper specimen collection/handling, submission of specimen other than nasopharyngeal swab, presence of viral mutation(s) within the areas targeted by this assay, and inadequate number of viral copies(<138 copies/mL). A negative result must be combined with clinical observations, patient history, and epidemiological information. The expected result is Negative.  Fact Sheet for Patients:  BloggerCourse.com  Fact Sheet for Healthcare Providers:  SeriousBroker.it  This test is no t yet approved or cleared by the [place] FDA and  has been authorized for detection and/or diagnosis of SARS-CoV-2 by FDA under an Emergency Use Authorization (EUA). This EUA will remain  in effect (meaning this test can be used) for the duration of the COVID-19 declaration under Section 564(b)(1) of the Act, 21 U.S.C.section  360bbb-3(b)(1), unless the authorization is terminated  or  revoked sooner.       Influenza A by PCR NEGATIVE NEGATIVE   Influenza B by PCR NEGATIVE NEGATIVE    Comment: (NOTE) The Xpert Xpress SARS-CoV-2/FLU/RSV plus assay is intended as an aid in the diagnosis of influenza from Nasopharyngeal swab specimens and should not be used as a sole basis for treatment. Nasal washings and aspirates are unacceptable for Xpert Xpress SARS-CoV-2/FLU/RSV testing.  Fact Sheet for Patients: BloggerCourse.com  Fact Sheet for Healthcare Providers: SeriousBroker.it  This test is not yet approved or cleared by the [place] FDA and has been authorized for detection and/or diagnosis of SARS-CoV-2 by FDA under an Emergency Use Authorization (EUA). This EUA will remain in effect (meaning this test can be used) for the duration of the COVID-19 declaration under Section 564(b)(1) of the Act, 21 U.S.C. section 360bbb-3(b)(1), unless the authorization is terminated or revoked.  Performed at [facility], [address], [place], [place] [address]   Troponin I (High Sensitivity)     Status: Abnormal   Collection Time: 12/25/20 10:24 PM  Result Value Ref Range   Troponin I (High Sensitivity) 44 (H) <18 ng/L    Comment: (NOTE) Elevated high sensitivity troponin I (hsTnI) values and significant  changes across serial measurements may suggest ACS but many other  chronic and acute conditions are known to elevate hsTnI results.  Refer to the "Links" section for chest pain algorithms and additional  guidance. Performed at [facility], [address], [place], [place] [address]   Brain natriuretic peptide     Status: Abnormal   Collection Time: 12/26/20 12:43 AM  Result Value Ref Range   B Natriuretic Peptide 1,333.9 (H) 0.0 - 100.0 pg/mL    Comment: Performed at [facility], [address] [place], [place] [address]  HIV Antibody (routine testing w rflx)     Status: None   Collection  Time: 12/26/20 12:43 AM  Result Value Ref Range   HIV Screen 4th Generation wRfx Non Reactive Non Reactive    Comment: Performed at [facility], [address], [place], [place] [address]  Lactic acid, plasma     Status: Abnormal   Collection Time: 12/26/20 12:43 AM  Result Value Ref Range   Lactic Acid, Venous 2.1 (HH) 0.5 - 1.9 mmol/L    Comment: CRITICAL RESULT CALLED TO, READ BACK BY AND VERIFIED WITH: BENFIELD A,RN 12/26/20 0130 WAYK Performed at [facility], [address] [place], [place] [address]   Basic metabolic panel     Status: Abnormal   Collection Time: 12/26/20  1:44 AM  Result Value Ref Range   Sodium 132 (L) 135 - 145 mmol/L   Potassium 5.1 3.5 - 5.1 mmol/L   Chloride 103 98 - 111 mmol/L   CO2 15 (L) 22 - 32 mmol/L   Glucose, Bld 87 70 - 99 mg/dL    Comment: Glucose reference range applies only to samples taken after fasting for at least 8 hours.   BUN 28 (H) 8 - 23 mg/dL   Creatinine, Ser 7.34 (H) 0.61 - 1.24 mg/dL   Calcium 8.7 (L) 8.9 - 10.3 mg/dL   GFR, Estimated >19 >37 mL/min    Comment: (NOTE) Calculated using the CKD-EPI Creatinine Equation (2021)    Anion gap 14 5 - 15    Comment: Performed at [facility], [address] [place], [place] [address]  CBC     Status: Abnormal   Collection Time: 12/26/20  1:44 AM  Result Value Ref Range   WBC 10.0 4.0 - 10.5 K/uL   RBC 3.64 (L) 4.22 - 5.81 MIL/uL   Hemoglobin 12.7 (L) 13.0 - 17.0 g/dL   HCT 61.6 (L) 07.3 - 71.0 %   MCV 103.6 (H) 80.0 - 100.0 fL   MCH 34.9 (H) 26.0 - 34.0 pg   MCHC 33.7 30.0 - 36.0 g/dL   RDW 62.6 94.8 - 54.6 %   Platelets 296 150 - 400 K/uL   nRBC 0.0 0.0 - 0.2 %    Comment: Performed at [facility], [address] [place], [place] [address]  Lipid panel     Status: Abnormal   Collection Time: 12/26/20  1:44 AM  Result Value Ref Range   Cholesterol 140 0 - 200 mg/dL   Triglycerides 009 (H) <150 mg/dL   HDL 48 >38 mg/dL   Total CHOL/HDL Ratio 2.9  RATIO   VLDL 39 0 - 40 mg/dL   LDL Cholesterol 53 0 - 99 mg/dL    Comment:        Total Cholesterol/HDL:CHD Risk Coronary Heart Disease Risk Table                     Men   Women  1/2 Average Risk   3.4   3.3  Average Risk       5.0   4.4  2 X Average Risk   9.6   7.1  3 X Average Risk  23.4   11.0        Use the calculated Patient Ratio above and the CHD Risk Table to determine the patient's CHD Risk.        ATP III CLASSIFICATION (LDL):  <100     mg/dL   Optimal  182-993  mg/dL   Near or Above                    Optimal  130-159  mg/dL   Borderline  716-967  mg/dL   High  >893     mg/dL   Very High Performed at [facility], [address] [place], [place] [address]   Lactic acid, plasma     Status: Abnormal   Collection Time: 12/26/20  1:44 AM  Result Value Ref Range   Lactic Acid, Venous 2.5 (HH) 0.5 - 1.9 mmol/L    Comment: CRITICAL VALUE NOTED.  VALUE IS CONSISTENT WITH PREVIOUSLY REPORTED AND CALLED VALUE. Performed at [facility], [address], [place], [place] [address]   Magnesium     Status: None   Collection Time: 12/26/20  1:44 AM  Result Value Ref Range   Magnesium 2.1 1.7 - 2.4 mg/dL    Comment: Performed at [facility], [address], [place], [place] [address]  Phosphorus     Status: Abnormal   Collection Time: 12/26/20  1:44 AM  Result Value Ref Range   Phosphorus 5.0 (H) 2.5 - 4.6 mg/dL    Comment: Performed at [facility], [address] [place], [place] [address]  Lactic acid, plasma     Status: Abnormal   Collection Time: 12/26/20  6:49 AM  Result Value Ref Range   Lactic Acid, Venous 2.5 (HH) 0.5 - 1.9 mmol/L    Comment: CRITICAL VALUE NOTED.  VALUE IS CONSISTENT WITH PREVIOUSLY REPORTED AND CALLED VALUE. Performed  at [facility], [address], [place], [place] [address]   Glucose, capillary     Status: Abnormal   Collection Time: 12/26/20  7:59 AM  Result Value Ref Range   Glucose-Capillary 110 (H) 70  - 99 mg/dL    Comment: Glucose reference range applies only to samples taken after fasting for at least 8 hours.   DG Chest 2 View  Result Date: 12/25/2020 CLINICAL DATA:  Initial evaluation for shortness of breath for 2 weeks, fatigue. EXAM: CHEST - 2 VIEW COMPARISON:  Prior radiograph from 08/02/2019 FINDINGS: Cardiomegaly. Mediastinal silhouette within normal limits. Aortic atherosclerosis. Lungs normally inflated. Mild diffuse vascular and interstitial prominence consistent with mild pulmonary interstitial edema. Associated small bilateral pleural effusions, right greater than left. Associated mild bibasilar subsegmental atelectasis. No focal infiltrates. No pneumothorax. No acute osseous finding. IMPRESSION: 1. Cardiomegaly with mild diffuse pulmonary interstitial edema and small bilateral pleural effusions, right greater than left. 2.  Aortic Atherosclerosis (ICD10-I70.0). Electronically Signed   By: Rise Mu M.D.   On: 12/25/2020 21:20   CT Angio Chest PE W and/or Wo Contrast  Result Date: 12/26/2020 CLINICAL DATA:  Increasing shortness of breath x2 weeks, fatigue, infected abdominal surgical wound with redness and drainage EXAM: CT ANGIOGRAPHY CHEST CT ABDOMEN AND PELVIS WITH CONTRAST TECHNIQUE: Multidetector CT imaging of the chest was performed using the standard protocol during bolus administration of intravenous contrast. Multiplanar CT image reconstructions and MIPs were obtained to evaluate the vascular anatomy. Multidetector CT imaging of the abdomen and pelvis was performed using the standard protocol during bolus administration of intravenous contrast. CONTRAST:  OMNIPAQUE IOHEXOL 350 MG/ML SOLN COMPARISON:  Chest radiographs dated 12/25/2020. CT abdomen/pelvis dated 12/02/2019. FINDINGS: CTA CHEST FINDINGS Cardiovascular: Satisfactory opacification the bilateral pulmonary arteries to the lobar level. Evaluation is mildly limited due to motion degradation. Within that  constraint, there is no evidence of pulmonary embolism. Study is not tailored for evaluation of the thoracic aorta. No evidence of thoracic aortic aneurysm. Atherosclerotic calcifications of the aortic arch. Heart is top-normal in size.  No pericardial effusion. Mild three-vessel coronary atherosclerosis. Mediastinum/Nodes: No suspicious mediastinal lymphadenopathy. Visualized thyroid is unremarkable. Lungs/Pleura: Evaluation of the lung parenchyma is constrained by respiratory motion. Within that constraint, there are no suspicious pulmonary nodules. Mild interlobular septal thickening in the lungs bilaterally, lower lobe predominant, suggesting mild interstitial edema. No focal consolidation. Small to moderate right and small left pleural effusions. Associated lower lobe compressive atelectasis. No pneumothorax. Musculoskeletal: Very mild degenerative changes of the mid thoracic spine. Healing/healed right anterolateral 4th through 7th rib fracture deformities. Review of the MIP images confirms the above findings. CT ABDOMEN and PELVIS FINDINGS Motion degraded images. Hepatobiliary: Liver is within normal limits. Layering gallbladder sludge (series 12/image 36), without associated inflammatory changes. No intrahepatic or extrahepatic ductal dilatation. Pancreas: Within normal limits. Spleen: Within normal limits. Adrenals/Urinary Tract: Adrenal glands are within normal limits. Kidneys are within normal limits.  No hydronephrosis. Stomach/Bowel: Stomach is within normal limits. 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 There is frank diastasis/laxity of the anterior abdominal wall with herniation of numerous loops of small bowel, which remain nondilated. Persistent overlying cutaneous thickening suggests cellulitis (series 12/image 87). Trace fluid beneath the skin surface, but without drainable fluid collection/abscess. Vascular/Lymphatic: No evidence of abdominal aortic  aneurysm. Atherosclerotic calcifications of the abdominal aorta and branch vessels. No suspicious abdominopelvic lymphadenopathy. Reproductive: Prostate is unremarkable. Other: Trace subcutaneous fluid, as noted above, without discrete abdominopelvic  ascites. Musculoskeletal: Mild degenerative changes of the lumbar spine. Review of the MIP images confirms the above findings. IMPRESSION: No evidence of pulmonary embolism. Mild interstitial edema. Small to moderate bilateral pleural effusions, right greater than left. Associated lower lobe compressive atelectasis. 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 Homero FellersFrank diastasis/laxity of the anterior abdominal wall with herniation of numerous loops of nondilated small bowel. Overlying skin thickening with trace subcutaneous fluid suggests cellulitis, but without drainable fluid collection/abscess. Electronically Signed   By: Charline BillsSriyesh  Krishnan M.D.   On: 12/26/2020 00:36   CT ABDOMEN PELVIS W CONTRAST  Result Date: 12/26/2020 CLINICAL DATA:  Increasing shortness of breath x2 weeks, fatigue, infected abdominal surgical wound with redness and drainage EXAM: CT ANGIOGRAPHY CHEST CT ABDOMEN AND PELVIS WITH CONTRAST TECHNIQUE: Multidetector CT imaging of the chest was performed using the standard protocol during bolus administration of intravenous contrast. Multiplanar CT image reconstructions and MIPs were obtained to evaluate the vascular anatomy. Multidetector CT imaging of the abdomen and pelvis was performed using the standard protocol during bolus administration of intravenous contrast. CONTRAST:  100mL OMNIPAQUE IOHEXOL 350 MG/ML SOLN COMPARISON:  Chest radiographs dated 12/25/2020. CT abdomen/pelvis dated 12/02/2019. FINDINGS: CTA CHEST FINDINGS Cardiovascular: Satisfactory opacification the bilateral pulmonary arteries to the lobar level. Evaluation is mildly limited due to motion degradation. Within that constraint, there is no evidence of  pulmonary embolism. Study is not tailored for evaluation of the thoracic aorta. No evidence of thoracic aortic aneurysm. Atherosclerotic calcifications of the aortic arch. Heart is top-normal in size.  No pericardial effusion. Mild three-vessel coronary atherosclerosis. Mediastinum/Nodes: No suspicious mediastinal lymphadenopathy. Visualized thyroid is unremarkable. Lungs/Pleura: Evaluation of the lung parenchyma is constrained by respiratory motion. Within that constraint, there are no suspicious pulmonary nodules. Mild interlobular septal thickening in the lungs bilaterally, lower lobe predominant, suggesting mild interstitial edema. No focal consolidation. Small to moderate right and small left pleural effusions. Associated lower lobe compressive atelectasis. No pneumothorax. Musculoskeletal: Very mild degenerative changes of the mid thoracic spine. Healing/healed right anterolateral 4th through 7th rib fracture deformities. Review of the MIP images confirms the above findings. CT ABDOMEN and PELVIS FINDINGS Motion degraded images. Hepatobiliary: Liver is within normal limits. Layering gallbladder sludge (series 12/image 36), without associated inflammatory changes. No intrahepatic or extrahepatic ductal dilatation. Pancreas: Within normal limits. Spleen: Within normal limits. Adrenals/Urinary Tract: Adrenal glands are within normal limits. Kidneys are within normal limits.  No hydronephrosis. Stomach/Bowel: Stomach is within normal limits. 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 There is frank diastasis/laxity of the anterior abdominal wall with herniation of numerous loops of small bowel, which remain nondilated. Persistent overlying cutaneous thickening suggests cellulitis (series 12/image 87). Trace fluid beneath the skin surface, but without drainable fluid collection/abscess. Vascular/Lymphatic: No evidence of abdominal aortic aneurysm. Atherosclerotic  calcifications of the abdominal aorta and branch vessels. No suspicious abdominopelvic lymphadenopathy. Reproductive: Prostate is unremarkable. Other: Trace subcutaneous fluid, as noted above, without discrete abdominopelvic ascites. Musculoskeletal: Mild degenerative changes of the lumbar spine. Review of the MIP images confirms the above findings. IMPRESSION: No evidence of pulmonary embolism. Mild interstitial edema. Small to moderate bilateral pleural effusions, right greater than left. Associated lower lobe compressive atelectasis. 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 Homero FellersFrank diastasis/laxity of the anterior abdominal wall with herniation of numerous loops of nondilated small bowel. Overlying skin thickening with trace subcutaneous fluid suggests cellulitis, but without drainable fluid collection/abscess. Electronically Signed   By: Roselie AwkwardSriyesh  Krishnan M.D.  On: 12/26/2020 00:36    Anti-infectives (From admission, onward)    Start     Dose/Rate Route Frequency Ordered Stop   12/26/20 1000  vancomycin (VANCOCIN) IVPB 1000 mg/200 mL premix        1,000 mg 200 mL/hr over 60 Minutes Intravenous Every 12 hours 12/26/20 0050     12/26/20 0100  vancomycin (VANCOREADY) IVPB 1500 mg/300 mL        1,500 mg 150 mL/hr over 120 Minutes Intravenous  Once 12/26/20 0048 12/26/20 0409        Assessment/Plan Ventral Hernia Loraine Freid is a 60 y.o. 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 Was followed by Dr. Arita Miss and Dr. [name] for this.  He was previously scheduled for skin graft surgery with Dr. Arita Miss but he canceled several times.  He is referred to [facility] but has not followed up with them.  He presented with complete heart block. There was concern his abdominal wall is infected. His skin changes on his abdominal wall do not appear to be cellulitis but rather fungal  (scaly blanchable erythematous patches extending from waist line to lower chest). He reports they have been present for several months with no acute change prior to presentation. There is no drainable fluid collection on CT. His wbc is wnl. He is afebrile and denies fevers at home. Primary team has also called ID. Will defer any tx course to them but does not need abx from our standpoint. I will ask [facility] to see for wound care assistance. No acute surgical needs at this time. Would recommend follow up at [facility] as previously recommended at discharge. Discussed with my attending, Dr. [name] who agrees with plan. We will follow peripherally. I discussed recs with cardiology.   Jacinto Halim, [facility] 12/26/2020, 10:45 AM Please see Amion for pager number during day hours 7:00am-4:30pm

## 2020-12-26 NOTE — Consult Note (Signed)
Date of Admission:  12/25/2020          Reason for Consult: Complete heart block with concern that patient might have endocarditis from chronic wound    Referring Provider: Dr. Lalla BrothersLambert   Assessment:  Complete heart block of unknown cause History of prior severe diverticulitis with perforation status post colectomy and ileostomy then status post Large ventral hernia with chronic abdominal wound followed by Dr. Dwain S[place] Dr. Arita MissPace and Dr. Mikey BussingHoffman Morbid obesity Hypertension History of DVT Diabetes mellitus MRSA +  Plan:  Reasonable to cover him with antibiotics while concern for infectious endocarditis remains and I am changing his antibiotics to daptomycin and Zosyn I agree with General surgery that his abdominal wound does not at all appear acutely infected I will check Lyme antibodies --- though he gives 0 clinical history to suggest that he has had this infection with no history of recent tick bites and no history of erythema migrans lesions Likely he will need a transesophageal echocardiogram if endocarditis is to be excluded Contact precauations    Active Problems:   DM2 (diabetes mellitus, type 2) (HCC)   Complete heart block (HCC)   Scheduled Meds:  [START ON 12/27/2020] aspirin EC  81 mg Oral Daily   folic acid  1 mg Oral Daily   mouth rinse  15 mL Mouth Rinse BID   multivitamin with minerals  1 tablet Oral Daily   thiamine  100 mg Oral Daily   Or   thiamine  100 mg Intravenous Daily   Continuous Infusions:  DAPTOmycin (CUBICIN)  IV     DOPamine     piperacillin-tazobactam (ZOSYN)  IV     PRN Meds:.acetaminophen, LORazepam **OR** LORazepam, nitroGLYCERIN, ondansetron (ZOFRAN) IV, traMADol  HPI: Luis FootsJohn [name] is a 60 y.o. male with a history of morbid obesity diabetes mellitus hypertension, diverticulitis twice with perforation status post colectomy and ileostomy who then developed abdominal wall hernia who has been followed by Dr. Dwain SarnaW[place] Dr. Arita MissPace and Dr.  Mikey BussingHoffman.  In talking to the patient he has not had any recent purulent drainage from his wound.  He tells me that he was treated with Augmentin [facility] couple of months ago but has not been on hospital rounds of antibiotics.  Denies fevers chills nausea malaise or other systemic symptoms to suggest that he has been bacteremic.  He came to the emergency department for work-up for shortness of breath.  In the ER he was found to be bradycardic with a heart rate in the 40s and high-grade complete AV block.  He is suffering from significant anxiety.  He has had a CT of the abdomen pelvis performed  He is being worked up by cardiology and electrophysiology.  Cardiology were concerned that his unexplained complete heart block could be related to infectious endocarditis.  He has had a transthoracic echocardiogram so far does not show vegetations.  Blood cultures have been taken and   Review of Systems: Review of Systems  Constitutional:  Negative for chills, diaphoresis, fever, malaise/fatigue and weight loss.  HENT:  Negative for congestion, ear pain, hearing loss and sore throat.   Eyes:  Negative for blurred vision and double vision.  Respiratory:  Negative for cough, sputum production, shortness of breath, wheezing and stridor.   Cardiovascular:  Negative for chest pain, palpitations and leg swelling.  Gastrointestinal:  Negative for abdominal pain, blood in stool, constipation, diarrhea, heartburn, melena, nausea and vomiting.  Genitourinary:  Negative  for dysuria, flank pain and frequency.  Musculoskeletal:  Negative for joint pain and myalgias.  Skin:  Negative for itching and rash.       Chronic wound  Neurological:  Positive for dizziness and weakness. Negative for sensory change, focal weakness, loss of consciousness and headaches.  Endo/Heme/Allergies:  Does not bruise/bleed easily.  Psychiatric/Behavioral:  Negative for depression, substance abuse  and suicidal ideas. The patient does not have insomnia.    Past Medical History:  Diagnosis Date   Diabetes mellitus without complication (HCC)    Diverticulitis    DVT (deep venous thrombosis) (HCC)    ETOH abuse    H/O colectomy    Hyperlipidemia    Hypertension    Smoker     Social History   Tobacco Use   Smoking status: Every Day    Packs/day: 1.00    Years: 44.00    Pack years: 44.00    Types: Cigarettes   Smokeless tobacco: Current  Vaping Use   Vaping Use: Never used  Substance Use Topics   Alcohol use: Yes    Alcohol/week: 5.0 standard drinks    Types: 5 Standard drinks or equivalent per week    Comment: Pt states drinks 5 pints of liquor weekly   Drug use: Yes    Types: Marijuana    Family History  Problem Relation Age of Onset   COPD Mother    Prostate cancer Father    Allergies  Allergen Reactions   Codeine Other (See Comments)    unknown    OBJECTIVE: Blood pressure (!) 167/63, pulse (!) 44, temperature 98 F (36.7 C), temperature source Axillary, resp. rate 17, height 6\' 1"  (1.854 m), weight 98.9 kg, SpO2 96 %.  Physical Exam Constitutional:      General: He is not in acute distress.    Appearance: Normal appearance. He is well-developed. He is obese. He is not ill-appearing or diaphoretic.  HENT:     Head: Normocephalic and atraumatic.     Right Ear: Hearing and external ear normal.     Left Ear: Hearing and external ear normal.     Nose: No nasal deformity or rhinorrhea.  Eyes:     General: No scleral icterus.    Extraocular Movements: Extraocular movements intact.     Conjunctiva/sclera: Conjunctivae normal.     Right eye: Right conjunctiva is not injected.     Left eye: Left conjunctiva is not injected.  Neck:     Vascular: No JVD.  Cardiovascular:     Rate and Rhythm: Regular rhythm. Bradycardia present.     Heart sounds: Normal heart sounds, S1 normal and S2 normal. No murmur heard.   No friction rub. No gallop.  Pulmonary:      Effort: Pulmonary effort is normal. No respiratory distress.     Breath sounds: No stridor. No wheezing or rhonchi.  Abdominal:     General: Bowel sounds are normal. There is distension.     Palpations: Abdomen is soft. There is no mass.     Tenderness: There is no abdominal tenderness. There is no rebound.     Hernia: A hernia is present.  Musculoskeletal:        General: Normal range of motion.     Right shoulder: Normal.     Left shoulder: Normal.     Cervical back: Normal range of motion and neck supple.     Right hip: Normal.     Left hip: Normal.  Right knee: Normal.     Left knee: Normal.  Lymphadenopathy:     Head:     Right side of head: No submandibular, preauricular or posterior auricular adenopathy.     Left side of head: No submandibular, preauricular or posterior auricular adenopathy.     Cervical: No cervical adenopathy.     Right cervical: No superficial or deep cervical adenopathy.    Left cervical: No superficial or deep cervical adenopathy.  Skin:    General: Skin is warm and dry.     Coloration: Skin is not pale.     Findings: No abrasion, bruising, ecchymosis, erythema, lesion or rash.     Nails: There is no clubbing.  Neurological:     General: No focal deficit present.     Mental Status: He is alert and oriented to person, place, and time.     Sensory: No sensory deficit.     Coordination: Coordination normal.     Gait: Gait normal.  Psychiatric:        Attention and Perception: Attention and perception normal. He is attentive.        Mood and Affect: Mood is depressed.        Speech: Speech normal.        Behavior: Behavior normal. Behavior is cooperative.        Thought Content: Thought content normal.        Cognition and Memory: Cognition and memory normal.        Judgment: Judgment normal.   Ostomy is clean    He has a large hernia which I examined and it which is pictured below from photos taken by general  surgery  12/26/2020:        Lab Results Lab Results  Component Value Date   WBC 10.0 12/26/2020   HGB 12.7 (L) 12/26/2020   HCT 37.7 (L) 12/26/2020   MCV 103.6 (H) 12/26/2020   PLT 296 12/26/2020    Lab Results  Component Value Date   CREATININE 1.26 (H) 12/26/2020   BUN 28 (H) 12/26/2020   NA 132 (L) 12/26/2020   K 5.1 12/26/2020   CL 103 12/26/2020   CO2 15 (L) 12/26/2020    Lab Results  Component Value Date   ALT 29 12/25/2020   AST 33 12/25/2020   ALKPHOS 124 12/25/2020   BILITOT 0.9 12/25/2020     Microbiology: Recent Results (from the past 240 hour(s))  Resp Panel by RT-PCR (Flu A&B, Covid) Nasopharyngeal Swab     Status: None   Collection Time: 12/25/20  9:35 PM   Specimen: Nasopharyngeal Swab; Nasopharyngeal(NP) swabs in vial transport medium  Result Value Ref Range Status   SARS Coronavirus 2 by RT PCR NEGATIVE NEGATIVE Final    Comment: (NOTE) SARS-CoV-2 target nucleic acids are NOT DETECTED.  The SARS-CoV-2 RNA is generally detectable in upper respiratory specimens during the acute phase of infection. The lowest concentration of SARS-CoV-2 viral copies this assay can detect is 138 copies/mL. A negative result does not preclude SARS-Cov-2 infection and should not be used as the sole basis for treatment or other patient management decisions. A negative result may occur with  improper specimen collection/handling, submission of specimen other than nasopharyngeal swab, presence of viral mutation(s) within the areas targeted by this assay, and inadequate number of viral copies(<138 copies/mL). A negative result must be combined with clinical observations, patient history, and epidemiological information. The expected result is Negative.  Fact Sheet for Patients:  BloggerCourse.com  Fact  Sheet for Healthcare Providers:  SeriousBroker.it  This test is no t yet approved or cleared by the [place]  FDA and  has been authorized for detection and/or diagnosis of SARS-CoV-2 by FDA under an Emergency Use Authorization (EUA). This EUA will remain  in effect (meaning this test can be used) for the duration of the COVID-19 declaration under Section 564(b)(1) of the Act, 21 U.S.C.section 360bbb-3(b)(1), unless the authorization is terminated  or revoked sooner.       Influenza A by PCR NEGATIVE NEGATIVE Final   Influenza B by PCR NEGATIVE NEGATIVE Final    Comment: (NOTE) The Xpert Xpress SARS-CoV-2/FLU/RSV plus assay is intended as an aid in the diagnosis of influenza from Nasopharyngeal swab specimens and should not be used as a sole basis for treatment. Nasal washings and aspirates are unacceptable for Xpert Xpress SARS-CoV-2/FLU/RSV testing.  Fact Sheet for Patients: BloggerCourse.com  Fact Sheet for Healthcare Providers: SeriousBroker.it  This test is not yet approved or cleared by the [place] FDA and has been authorized for detection and/or diagnosis of SARS-CoV-2 by FDA under an Emergency Use Authorization (EUA). This EUA will remain in effect (meaning this test can be used) for the duration of the COVID-19 declaration under Section 564(b)(1) of the Act, 21 U.S.C. section 360bbb-3(b)(1), unless the authorization is terminated or revoked.  Performed at [facility], [address] [place], [place] [address]     Acey Lav, MD [facility] [phone] pager  12/26/2020, 1:35 PM

## 2020-12-26 NOTE — Consult Note (Signed)
Medical Consultation   Luis [name]  ZOX:[id number]  DOB: May 23, 1961  DOA: 12/25/2020  PCP: Henrine Screws, MD   Outpatient Specialists: Arita Miss - plastic surgery    Requesting physician: Lalla Brothers - cardiology  Reason for consultation: Complete heart block but no pacer placed bc VSS and took metoprolol yesterday.  Has h/o perf diverticulitis with colostomy.  Abdomen is draining and looks infected, starting an antibiotics.  Surgery and ID consulted.     History of Present Illness: Luis [name] is an 60 y.o. male with h/o DM; DVT; HTN; HLD; tobacco dependence; diverticulitis with perforation s/p colectomy and ileostomy; and ETOH dependence who presented on 7/15 with SOB, fatigue who was found to be in complete heart block.  He saw Dr. Arita Miss on 11/20/19 for "scattered epithelialization over chornic abdominal wound that likely healed directly over his bowel."  He recommended conservation wound measures for now with eventual plan for colostomy reversal combined with ventral hernia repair with mesh - a complicated procedure.  He had ongoing wound care with [facility] and the clinic until his f/u visit on 02/26/20, when he was noted to not be a candidate for further intra-abdominal operations until his wound is healed and so he presented for consideration of skin grafting.  Despite Xeroform and gauze, he had not had much would healing in the interval 3 months.  A wound vac order was placed and he was scheduled for surgery on 03/10/20; this was apparently canceled by the patient in the interval and rescheduled.  He subsequently called on 03/19/20 and reported that he "has been smoking and drinking too much" and was unable to stop and so requested a second delay for the surgery (not rescheduled, just canceled at that time).  [facility] then called the MD office on 04/03/20 to report that the patient was no longer changing dressings daily and "has been intoxicated".  Since he canceled his surgery and was no  longer seeing Dr. Arita Miss, he was encouraged to reach out to his PCP for ongoing wound care orders.  He called the office again on 05/05/20 requesting to schedule the surgery and reported that "I quit" alcohol and tobacco "tomorrow".  He was scheduled for office f/u and appears to have been lost to f/u.  Once again, he called the office on 10/29/20 to attempt to schedule the surgery; he was encouraged to call [facility] given the medical and social complexity of his situation.  He reports that he has not followed up at [facility].  He has continued to drink at least 1/5 liquor daily.  He continues to smoke >1 ppd.  He has been lost to f/u for these reasons.  He reports that his abdominal wound is actually improved with regards to the central area of sloughing.  The surrounding area is not significantly changed and may also be somewhat improved.  He denies fever/chills or other concerns for active infection.    Review of Systems:  ROS As per HPI otherwise 10 point review of systems negative.    Past Medical History: Past Medical History:  Diagnosis Date   Diabetes mellitus without complication (HCC)    Diverticulitis    DVT (deep venous thrombosis) (HCC)    ETOH abuse    H/O colectomy    Hyperlipidemia    Hypertension    Smoker     Past Surgical History: Past Surgical History:  Procedure Laterality Date   COLOSTOMY  TONSILLECTOMY       Allergies:   Allergies  Allergen Reactions   Codeine Other (See Comments)    unknown     Social History:  reports that he has been smoking cigarettes. He has a 44.00 pack-year smoking history. He uses smokeless tobacco. He reports current alcohol use of about 5.0 standard drinks of alcohol per week. He reports current drug use. Drug: Marijuana.   Family History: Family History  Problem Relation Age of Onset   COPD Mother    Prostate cancer Father       Physical Exam: Vitals:   12/26/20 0229 12/26/20 0419 12/26/20 0800 12/26/20 1121  BP:  140/70  (!) 142/72 (!) 167/63  Pulse: (!) 40 95 (!) 41 (!) 44  Resp: 14 20 20 17   Temp:  98 F (36.7 C) 98.8 F (37.1 C) 98 F (36.7 C)  TempSrc:  Oral Oral Axillary  SpO2: 94% 92% 96% 96%  Weight:  98.9 kg    Height:        Constitutional: Alert and awake, oriented x3, not in any acute distress.  Disheveled, mildly irritable and tremulous, suggestive of withdrawal. Eyes: EOMI, irises appear normal, anicteric sclera,  ENMT: external ears and nose appear normal, normal hearing, Lips appear normal, poor dentition Neck: neck appears normal, no masses, normal ROM CVS: marked bradycardia in 30-40s range Respiratory:  clear to auscultation bilaterally, no wheezing, rales or rhonchi. Respiratory effort normal. No accessory muscle use.  Abdomen: central region of hypergranulation tissue with surrounding erythema with scaling.  Not obviously grossly infected but appears to have fungal rash surrounding central lesion.  Wound dressing is obviously not being changed regularly and so the bandage is saturated with purulent and foul-smelling drainage.  Appears to be superficial covering over the bowel itself and peristalsis is visualized.  Ostomy in RLQ with large amount of soft green/brown stool present.      Musculoskeletal: : no cyanosis, clubbing or edema noted bilaterally Psych: judgement and insight appear impaired due to ETOH withdrawal, stable mood and affect, mental status   Data reviewed:  I have personally reviewed the recent labs and imaging studies  Pertinent Labs:   Na++ 132 CO2 15 BUN 28/Creatinine 1.26/GFR >60 BNP 1333.9 Lactate 2.1, 2.5, 2.5 WBC 10.0   Inpatient Medications:   Scheduled Meds:  [START ON 12/27/2020] aspirin EC  81 mg Oral Daily   folic acid  1 mg Oral Daily   insulin aspart  0-15 Units Subcutaneous TID WC   insulin aspart  0-5 Units Subcutaneous QHS   mouth rinse  15 mL Mouth Rinse BID   multivitamin with minerals  1 tablet Oral Daily   nicotine  21 mg  Transdermal Daily   thiamine  100 mg Oral Daily   Or   thiamine  100 mg Intravenous Daily   Continuous Infusions:  DAPTOmycin (CUBICIN)  IV     DOPamine     piperacillin-tazobactam (ZOSYN)  IV       Radiological Exams on Admission: DG Chest 2 View  Result Date: 12/25/2020 CLINICAL DATA:  Initial evaluation for shortness of breath for 2 weeks, fatigue. EXAM: CHEST - 2 VIEW COMPARISON:  Prior radiograph from 08/02/2019 FINDINGS: Cardiomegaly. Mediastinal silhouette within normal limits. Aortic atherosclerosis. Lungs normally inflated. Mild diffuse vascular and interstitial prominence consistent with mild pulmonary interstitial edema. Associated small bilateral pleural effusions, right greater than left. Associated mild bibasilar subsegmental atelectasis. No focal infiltrates. No pneumothorax. No acute osseous finding. IMPRESSION: 1. Cardiomegaly with  mild diffuse pulmonary interstitial edema and small bilateral pleural effusions, right greater than left. 2.  Aortic Atherosclerosis (ICD10-I70.0). Electronically Signed   By: Rise Mu M.D.   On: 12/25/2020 21:20   CT Angio Chest PE W and/or Wo Contrast  Result Date: 12/26/2020 CLINICAL DATA:  Increasing shortness of breath x2 weeks, fatigue, infected abdominal surgical wound with redness and drainage EXAM: CT ANGIOGRAPHY CHEST CT ABDOMEN AND PELVIS WITH CONTRAST TECHNIQUE: Multidetector CT imaging of the chest was performed using the standard protocol during bolus administration of intravenous contrast. Multiplanar CT image reconstructions and MIPs were obtained to evaluate the vascular anatomy. Multidetector CT imaging of the abdomen and pelvis was performed using the standard protocol during bolus administration of intravenous contrast. CONTRAST:  OMNIPAQUE IOHEXOL 350 MG/ML SOLN COMPARISON:  Chest radiographs dated 12/25/2020. CT abdomen/pelvis dated 12/02/2019. FINDINGS: CTA CHEST FINDINGS Cardiovascular: Satisfactory  opacification the bilateral pulmonary arteries to the lobar level. Evaluation is mildly limited due to motion degradation. Within that constraint, there is no evidence of pulmonary embolism. Study is not tailored for evaluation of the thoracic aorta. No evidence of thoracic aortic aneurysm. Atherosclerotic calcifications of the aortic arch. Heart is top-normal in size.  No pericardial effusion. Mild three-vessel coronary atherosclerosis. Mediastinum/Nodes: No suspicious mediastinal lymphadenopathy. Visualized thyroid is unremarkable. Lungs/Pleura: Evaluation of the lung parenchyma is constrained by respiratory motion. Within that constraint, there are no suspicious pulmonary nodules. Mild interlobular septal thickening in the lungs bilaterally, lower lobe predominant, suggesting mild interstitial edema. No focal consolidation. Small to moderate right and small left pleural effusions. Associated lower lobe compressive atelectasis. No pneumothorax. Musculoskeletal: Very mild degenerative changes of the mid thoracic spine. Healing/healed right anterolateral 4th through 7th rib fracture deformities. Review of the MIP images confirms the above findings. CT ABDOMEN and PELVIS FINDINGS Motion degraded images. Hepatobiliary: Liver is within normal limits. Layering gallbladder sludge (series 12/image 36), without associated inflammatory changes. No intrahepatic or extrahepatic ductal dilatation. Pancreas: Within normal limits. Spleen: Within normal limits. Adrenals/Urinary Tract: Adrenal glands are within normal limits. Kidneys are within normal limits.  No hydronephrosis. Stomach/Bowel: Stomach is within normal limits. 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 There is frank diastasis/laxity of the anterior abdominal wall with herniation of numerous loops of small bowel, which remain nondilated. Persistent overlying cutaneous thickening suggests cellulitis (series 12/image 87).  Trace fluid beneath the skin surface, but without drainable fluid collection/abscess. Vascular/Lymphatic: No evidence of abdominal aortic aneurysm. Atherosclerotic calcifications of the abdominal aorta and branch vessels. No suspicious abdominopelvic lymphadenopathy. Reproductive: Prostate is unremarkable. Other: Trace subcutaneous fluid, as noted above, without discrete abdominopelvic ascites. Musculoskeletal: Mild degenerative changes of the lumbar spine. Review of the MIP images confirms the above findings. IMPRESSION: No evidence of pulmonary embolism. Mild interstitial edema. Small to moderate bilateral pleural effusions, right greater than left. Associated lower lobe compressive atelectasis. 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 Overlying skin thickening with trace subcutaneous fluid suggests cellulitis, but without drainable fluid collection/abscess. Electronically Signed   By: Charline Bills M.D.   On: 12/26/2020 00:36   CT ABDOMEN PELVIS W CONTRAST  Result Date: 12/26/2020 CLINICAL DATA:  Increasing shortness of breath x2 weeks, fatigue, infected abdominal surgical wound with redness and drainage EXAM: CT ANGIOGRAPHY CHEST CT ABDOMEN AND PELVIS WITH CONTRAST TECHNIQUE: Multidetector CT imaging of the chest was performed using the standard protocol during bolus  administration of intravenous contrast. Multiplanar CT image reconstructions and MIPs were obtained to evaluate the vascular anatomy. Multidetector CT imaging of the abdomen and pelvis was performed using the standard protocol during bolus administration of intravenous contrast. CONTRAST:  OMNIPAQUE IOHEXOL 350 MG/ML SOLN COMPARISON:  Chest radiographs dated 12/25/2020. CT abdomen/pelvis dated 12/02/2019. FINDINGS: CTA CHEST FINDINGS Cardiovascular: Satisfactory opacification the bilateral  pulmonary arteries to the lobar level. Evaluation is mildly limited due to motion degradation. Within that constraint, there is no evidence of pulmonary embolism. Study is not tailored for evaluation of the thoracic aorta. No evidence of thoracic aortic aneurysm. Atherosclerotic calcifications of the aortic arch. Heart is top-normal in size.  No pericardial effusion. Mild three-vessel coronary atherosclerosis. Mediastinum/Nodes: No suspicious mediastinal lymphadenopathy. Visualized thyroid is unremarkable. Lungs/Pleura: Evaluation of the lung parenchyma is constrained by respiratory motion. Within that constraint, there are no suspicious pulmonary nodules. Mild interlobular septal thickening in the lungs bilaterally, lower lobe predominant, suggesting mild interstitial edema. No focal consolidation. Small to moderate right and small left pleural effusions. Associated lower lobe compressive atelectasis. No pneumothorax. Musculoskeletal: Very mild degenerative changes of the mid thoracic spine. Healing/healed right anterolateral 4th through 7th rib fracture deformities. Review of the MIP images confirms the above findings. CT ABDOMEN and PELVIS FINDINGS Motion degraded images. Hepatobiliary: Liver is within normal limits. Layering gallbladder sludge (series 12/image 36), without associated inflammatory changes. No intrahepatic or extrahepatic ductal dilatation. Pancreas: Within normal limits. Spleen: Within normal limits. Adrenals/Urinary Tract: Adrenal glands are within normal limits. Kidneys are within normal limits.  No hydronephrosis. Stomach/Bowel: Stomach is within normal limits. 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 There is frank diastasis/laxity of the anterior abdominal wall with herniation of numerous loops of small bowel, which remain nondilated. Persistent overlying cutaneous thickening suggests cellulitis (series 12/image 87). Trace fluid beneath the skin  surface, but without drainable fluid collection/abscess. Vascular/Lymphatic: No evidence of abdominal aortic aneurysm. Atherosclerotic calcifications of the abdominal aorta and branch vessels. No suspicious abdominopelvic lymphadenopathy. Reproductive: Prostate is unremarkable. Other: Trace subcutaneous fluid, as noted above, without discrete abdominopelvic ascites. Musculoskeletal: Mild degenerative changes of the lumbar spine. Review of the MIP images confirms the above findings. IMPRESSION: No evidence of pulmonary embolism. Mild interstitial edema. Small to moderate bilateral pleural effusions, right greater than left. Associated lower lobe compressive atelectasis. 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 Overlying skin thickening with trace subcutaneous fluid suggests cellulitis, but without drainable fluid collection/abscess. Electronically Signed   By: Charline Bills M.D.   On: 12/26/2020 00:36   ECHOCARDIOGRAM COMPLETE  Result Date: 12/26/2020    ECHOCARDIOGRAM REPORT   Patient Name:   YEHOSHUA VITELLI Date of Exam: 12/26/2020 Medical Rec #:  [id number]     Height:       73.0 in Accession #:    [id number]    Weight:       218.0 lb Date of Birth:  04/23/1961     BSA:          2.232 m Patient Age:    60 years      BP:           142/72 mmHg Patient Gender: M             HR:           42 bpm. Exam Location:  Inpatient Procedure: 2D Echo, Cardiac Doppler,  Color Doppler and Intracardiac            Opacification Agent                     STAT ECHO Reported to: Dr CroitorRoyann Shiversu on 12/26/2020 10:05:00 AM. Indications:    Dyspnea R06.00  History:        Patient has no prior history of Echocardiogram examinations.                 Risk Factors:Hypertension, Diabetes, Dyslipidemia and Current                 Smoker. ETOH abuse. DVT.  Sonographer:    Tiffany Dance Referring Phys: [id number]  HAO MENG IMPRESSIONS  1. Left ventricular ejection fraction, by estimation, is 65 to 70%. The left ventricle has normal function. The left ventricle has no regional wall motion abnormalities. There is moderate concentric left ventricular hypertrophy. Diastolic function cannot be evaluated due to AV dissociation, but annulus early diastolic velocities are normal.  2. Right ventricular systolic function is normal. The right ventricular size is normal. There is normal pulmonary artery systolic pressure.  3. Left atrial size was moderately dilated.  4. 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 The mitral valve is normal in structure. Mild mitral valve regurgitation.  5. The aortic valve is tricuspid. There is mild calcification of the aortic valve. Aortic valve regurgitation is not visualized. Mild to moderate aortic valve sclerosis/calcification is present, without any evidence of aortic stenosis.  6. Aortic dilatation noted. There is borderline dilatation of the aortic root, measuring 38 mm.  7. The inferior vena cava is dilated in size with >50% respiratory variability, suggesting right atrial pressure of 8 mmHg. Conclusion(s)/Recommendation(s): No evidence of valvular vegetations on this transthoracic echocardiogram. Would recommend a transesophageal echocardiogram to exclude infective endocarditis if clinically indicated. The rhythm appears to be complete heart  block with junctional escape rhythm. FINDINGS  Left Ventricle: Left ventricular ejection fraction, by estimation, is 65 to 70%. The left ventricle has normal function. The left ventricle has no regional wall motion abnormalities. Definity contrast agent was given IV to delineate the left ventricular  endocardial borders. The left ventricular internal cavity size was normal in size. There is moderate concentric left ventricular hypertrophy. Diastolic function cannot be evaluated due to AV dissociation, but annulus early diastolic  velocities are normal. Right Ventricle: The right ventricular size is normal. No increase in right ventricular wall thickness. Right ventricular systolic function is normal. There is normal pulmonary artery systolic pressure. The tricuspid regurgitant velocity is 2.46 m/s, and  with an assumed right atrial pressure of 8 mmHg, the estimated right ventricular systolic pressure is 32.2 mmHg. Left Atrium: Left atrial size was moderately dilated. Right Atrium: Right atrial size was normal in size. Pericardium: There is no evidence of pericardial effusion. 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 The mitral valve is normal in structure. Mild mitral annular calcification. Mild mitral valve regurgitation, with centrally-directed jet. Tricuspid Valve: The tricuspid valve is normal in structure. Tricuspid valve regurgitation is mild. Aortic Valve: The aortic valve is tricuspid. There is mild calcification of the aortic valve. Aortic valve regurgitation is not visualized. Mild to moderate aortic valve sclerosis/calcification is present, without any evidence of aortic stenosis. Aortic valve mean gradient measures 22.0 mmHg. Aortic valve peak gradient measures 38.6 mmHg. Pulmonic Valve: The pulmonic valve was normal in structure. Pulmonic valve regurgitation is trivial. Aorta:  Aortic dilatation noted. There is borderline dilatation of the aortic root, measuring 38 mm. Venous: The inferior vena cava is dilated in size with greater than 50% respiratory variability, suggesting right atrial pressure of 8 mmHg. IAS/Shunts: No atrial level shunt detected by color flow Doppler.  LEFT VENTRICLE PLAX 2D LVIDd:         4.80 cm  Diastology LVIDs:         3.00 cm  LV e' medial:    11.70 cm/s LV PW:         1.50 cm  LV E/e' medial:  15.9 LV IVS:        1.60 cm  LV e' lateral:   13.80 cm/s LVOT diam:     2.50 cm  LV E/e' lateral: 13.5 LVOT Area:     4.91 cm  RIGHT VENTRICLE             IVC RV Basal  diam:  3.30 cm     IVC diam: 2.30 cm RV Mid diam:    2.60 cm RV S prime:     14.70 cm/s TAPSE (M-mode): 2.3 cm LEFT ATRIUM              Index       RIGHT ATRIUM           Index LA diam:        4.90 cm  2.20 cm/m  RA Area:     18.10 cm LA Vol (A2C):   120.0 ml 53.76 ml/m RA Volume:   47.30 ml  21.19 ml/m LA Vol (A4C):   74.9 ml  33.56 ml/m LA Biplane Vol: 96.3 ml  43.14 ml/m  AORTIC VALVE AV Vmax:      310.50 cm/s AV Vmean:     214.000 cm/s AV VTI:       0.651 m AV Peak Grad: 38.6 mmHg AV Mean Grad: 22.0 mmHg  AORTA Ao Root diam: 3.80 cm Ao Asc diam:  3.60 cm MITRAL VALVE                TRICUSPID VALVE MV Area (PHT): 3.99 cm     TR Peak grad:   24.2 mmHg MV Decel Time: 190 msec     TR Vmax:        246.00 cm/s MV E velocity: 186.00 cm/s MV A velocity: 100.00 cm/s  SHUNTS MV E/A ratio:  1.86         Systemic Diam: 2.50 cm Mihai Croitoru MD Electronically signed by Thurmon Fair MD Signature Date/Time: 12/26/2020/10:56:20 AM    Final     Impression/Recommendations Principal Problem:   Complete heart block (HCC) Active Problems:   DM2 (diabetes mellitus, type 2) (HCC)   Chronic abdominal wound infection, subsequent encounter   Alcohol dependence with uncomplicated withdrawal (HCC)   Tobacco dependence   Essential hypertension  Complete heart block -Patient presented with fatigue and SOB and was found to be in failure with complete heart block -He was admitted by the cardiology service in anticipation of need for a pacer -This AM, there was concern for intraabdominal infection/endocarditis and so TRH was called -See below, but his heart block appears to be his most pressing issue at this time and will likely need to be addressed first, as he would not be appropriate for anesthesia with a complete heart block -If TEE is needed to r/o endocarditis, cardiology would perform this service; would suggest awaiting blood cultures and would only consider this if positive  Chronic  abdominal wound  -Remote  h/o diverticulitis with perf with resultant ostomy and subsequent non-healing abdominal wound -In review of the records, this issue has been ongoing for more than a year -He ultimately would benefit from ostomy reversal and hernia repair with mesh, but this would be a large undertaking -The interval plan was to perform skin grafting -Unfortunately, with his ongoing ETOH and tobacco dependence, this has been deferred multiple times (seemingly by the patient) -Most recently, he was encouraged to go to [facility] for this issue due to its complexity -We were called to evaluate him due to concern for severe infection -While his lactate is elevated, he does not have SIRS criteria suggestive of sepsis and appears to be stable -It is possible that there is some superinfection of his abdominal wound, but the patient reports that it is actually improving at this time  -He does appear to have a yeast overgrowth -Will defer antibiotics to ID, but not overly convinced that this is currently needed -Could consider Diflucan for a few doses vs. Topical -He would most benefit from wound/ostomy care at this time -It is quite unlikely that he would be a candidate for surgical intervention with a complete heart block in the best of circumstances -In this case, with ongoing ETOH and tobacco, he would be extremely unlikely to heal if he did have skin grafting.   -Will defer to surgery at this time  -As noted above, somewhat low suspicion for active infection; would suggest evaluation for endocarditis if blood cultures are positive  ETOH dependence -Patient with chronic ETOH dependence -Number of drinks per day: 1/5+ liquor -He is at high risk for complications of withdrawal including seizures, DTs; he is at high risk for needing Precedex to help get through DTs -CIWA protocol -Folate, thiamine, and MVI ordered -Will provide symptom-triggered BZD (ativan per CIWA protocol) only since the patient is able to communicate;  is not showing current signs of delirium; and has no history of severe withdrawal. -Consider fixed-dose (Valium 5 mg PO q6h) and also symptom-triggered BZD (Ativan per CIWA protocol)  -Consider offering a medication for Alcohol Use Disorder at the time of d/c, to include Disulfuram; Naltrexone; or Acamprosate.  Tobacco dependence -Encourage cessation.   -This was discussed with the patient and should be reviewed on an ongoing basis.   -Patch ordered   DM -Will check A1c (6.3 in 07/2019) -hold Glucophage -Cover with moderate-scale SSI   HTN -Holding Toprol due to heart block -Cardiology is also holding Norvasc  HLD -Resume Lipitor when appropriate   Thank you for this consultation.  Based on the primary issues on this patient being cardiac in nature with surgery and ID also consulting, further assistance from [facility] does not appear to be necessary at this time.  Will sign off for now.  Please reconsult should new acute needs arise.   Time Spent: 70 minutes  Jonah Blue M.D. [facility] 12/26/2020, 2:14 PM

## 2020-12-26 NOTE — Progress Notes (Signed)
Pharmacy Antibiotic Note  Luis [name] is a 60 y.o. male admitted on 12/25/2020 with  wound infection .  Pharmacy has been consulted for Vancomycin dosing. WBC 10.1. Scr 1.32. Chronic abdominal wound.   Plan: Vancomycin 1500 mg IV x 1, then 1000 mg IV q12h >>Estimated AUC: 491 Trend WBC, temp, renal function  F/U infectious work-up Drug levels as indicated   Height: 6\' 1"  (185.4 cm) Weight: 95 kg (209 lb 7 oz) IBW/kg (Calculated) : 79.9  Temp (24hrs), Avg:97.9 F (36.6 C), Min:97.9 F (36.6 C), Max:97.9 F (36.6 C)  Recent Labs  Lab 12/25/20 2024  WBC 10.1  CREATININE 1.32*    Estimated Creatinine Clearance: 66.4 mL/min (A) (by C-G formula based on SCr of 1.32 mg/dL (H)).    Allergies  Allergen Reactions   Codeine Other (See Comments)    unknown    2025, PharmD, BCPS Clinical Pharmacist Phone: [phone]

## 2020-12-26 NOTE — ED Notes (Signed)
RN aware of pts HR 

## 2020-12-26 NOTE — Progress Notes (Signed)
  Echocardiogram 2D Echocardiogram has been performed.  Perry Molla G Miracle Mongillo 12/26/2020, 10:06 AM

## 2020-12-26 NOTE — ED Notes (Signed)
Report attempted, asked to call back.

## 2020-12-26 NOTE — Consult Note (Signed)
Electrophysiology Consultation:   Patient ID: Luis [name] MRN: [id number]; DOB: 1960-10-07  Admit date: 12/25/2020 Date of Consult: 12/26/2020  PCP:  Henrine Screws, MD   [facility] Providers EP: [name]   Patient Profile:   Luis [name] is a 60 y.o. male with a hx of DM, HTN, diverticulitis w/ perforation s/p colectomy and ileostomy complicated by wound infections for the past 2 years who presented with 2 weeks of dyspnea and was found to be in complete heart block.  History of Present Illness:   [name] tells me this morning that he has been really struggling with his abdominal wounds for 2 years. He has never been told about ECG abnormalities. He changes his wounds at home but he does endorse ongoing drainage of foul smelling purulence. He also endorses frequent fevers/chills.  No syncope or presyncope. The shortness of breath has been ongoing for 2 weeks.  Past Medical History:  Diagnosis Date   Diabetes mellitus without complication (HCC)    Diverticulitis    DVT (deep venous thrombosis) (HCC)    ETOH abuse    H/O colectomy    Hyperlipidemia    Hypertension    Smoker     Past Surgical History:  Procedure Laterality Date   COLOSTOMY     TONSILLECTOMY       Home Medications:  Prior to Admission medications   Medication Sig Start Date End Date Taking? Authorizing Provider  acetaminophen (TYLENOL) 650 MG CR tablet Take 650 mg by mouth every 8 (eight) hours as needed for pain.    Yes [provider]  amLODipine (NORVASC) 10 MG tablet Take 10 mg by mouth daily.   Yes [provider]  atorvastatin (LIPITOR) 10 MG tablet Take 10 mg by mouth daily. 11/20/20  Yes [provider]  ibuprofen (ADVIL) 200 MG tablet Take 200 mg by mouth every 6 (six) hours as needed for headache or moderate pain.   Yes [provider]  metFORMIN (GLUCOPHAGE) 1000 MG tablet Take 500 mg by mouth daily.    Yes [provider]   metoprolol succinate (TOPROL-XL) 25 MG 24 hr tablet Take 25 mg by mouth daily. 12/20/20  Yes [provider]  Multiple Vitamin (MULTIVITAMIN WITH MINERALS) TABS tablet Take 1 tablet by mouth every other day.    Yes [provider]  traMADol (ULTRAM) 50 MG tablet Take 1 tablet (50 mg total) by mouth every 6 (six) hours as needed for moderate pain. 08/04/19  Yes Burnadette Pop, MD  enoxaparin (LOVENOX) 40 MG/0.4ML injection Inject 0.4 mLs (40 mg total) into the skin daily for 7 days. Patient not taking: Reported on 12/25/2020 03/09/20 03/16/20  Scheeler, Kermit Balo, PA-C    Inpatient Medications: Scheduled Meds:  [START ON 12/27/2020] aspirin EC  81 mg Oral Daily   atorvastatin  40 mg Oral Daily   folic acid  1 mg Oral Daily   mouth rinse  15 mL Mouth Rinse BID   multivitamin with minerals  1 tablet Oral Daily   thiamine  100 mg Oral Daily   Or   thiamine  100 mg Intravenous Daily   Continuous Infusions:  DOPamine     vancomycin     PRN Meds: acetaminophen, LORazepam **OR** LORazepam, nitroGLYCERIN, ondansetron (ZOFRAN) IV, traMADol  Allergies:    Allergies  Allergen Reactions   Codeine Other (See Comments)    unknown    Social History:   Social History   Socioeconomic History   Marital status: Single  Spouse name: Not on file   Number of children: Not on file   Years of education: Not on file   Highest education level: Not on file  Occupational History   Occupation: Pt states not able to work  Tobacco Use   Smoking status: Every Day    Packs/day: 1.00    Years: 44.00    Pack years: 44.00    Types: Cigarettes   Smokeless tobacco: Current  Vaping Use   Vaping Use: Never used  Substance and Sexual Activity   Alcohol use: Yes    Alcohol/week: 5.0 standard drinks    Types: 5 Standard drinks or equivalent per week    Comment: Pt states drinks 5 pints of liquor weekly   Drug use: Yes    Types: Marijuana   Sexual activity: Not Currently  Other Topics  Concern   Not on file  Social History Narrative   Not on file   Social Determinants of Health   Financial Resource Strain: Not on file  Food Insecurity: Not on file  Transportation Needs: Not on file  Physical Activity: Not on file  Stress: Not on file  Social Connections: Not on file  Intimate Partner Violence: Not on file    Family History:    Family History  Problem Relation Age of Onset   COPD Mother    Prostate cancer Father      ROS:  Please see the history of present illness.   All other ROS reviewed and negative.     Physical Exam/Data:   Vitals:   12/26/20 0115 12/26/20 0229 12/26/20 0419 12/26/20 0800  BP:   140/70 (!) 142/72  Pulse:  (!) 40 95 (!) 41  Resp:  14 20 20   Temp: 98 F (36.7 C)  98 F (36.7 C) 98.8 F (37.1 C)  TempSrc: Oral  Oral Oral  SpO2:  94% 92% 96%  Weight:   98.9 kg   Height:        Intake/Output Summary (Last 24 hours) at 12/26/2020 0827 Last data filed at 12/26/2020 0700 Gross per 24 hour  Intake 300 ml  Output 650 ml  Net -350 ml   Last 3 Weights 12/26/2020 12/25/2020 01/27/2020  Weight (lbs) 218 lb 0.6 oz 209 lb 7 oz 177 lb  Weight (kg) 98.9 kg 95 kg 80.287 kg     Body mass index is 28.77 kg/m.     General:  Chronically ill appearing, anxious HEENT: normal Lymph: no adenopathy Neck: no JVD Endocrine:  No thryomegaly Vascular: No carotid bruits; FA pulses 2+ bilaterally without bruits  Cardiac:  regular rhythm, bradycardic, III/VI holosystolic murmur along the left sternal borders  Lungs:  clear to auscultation bilaterally, no wheezing, rhonchi or rales  Abd: soft, nontender, no hepatomegaly. Large open wound on anterior abdomen with foul smelling purulence in wound and on large bandage.   Ext: no edema Musculoskeletal:  No deformities, BUE and BLE strength normal and equal Skin: warm and dry  Neuro:  CNs 2-12 intact, no focal abnormalities noted Psych:  Normal affect   EKG:  The EKG was personally reviewed and  demonstrates:  complete heart block w narrow escape in the 40s Telemetry:  Telemetry was personally reviewed and demonstrates:  complete heart block. Stable junctional escape in the 40s.  Relevant CV Studies: Echo pending   Laboratory Data:  High Sensitivity Troponin:   Recent Labs  Lab 12/25/20 2024 12/25/20 2224  TROPONINIHS 44* 44*     Chemistry Recent Labs  Lab 12/25/20 2024 12/26/20 0144  NA 132* 132*  K 5.5* 5.1  CL 103 103  CO2 17* 15*  GLUCOSE 99 87  BUN 26* 28*  CREATININE 1.32* 1.26*  CALCIUM 8.9 8.7*  GFRNONAA >60 >60  ANIONGAP 12 14    Recent Labs  Lab 12/25/20 2024  PROT 7.4  ALBUMIN 3.4*  AST 33  ALT 29  ALKPHOS 124  BILITOT 0.9   Hematology Recent Labs  Lab 12/25/20 2024 12/26/20 0144  WBC 10.1 10.0  RBC 3.63* 3.64*  HGB 12.7* 12.7*  HCT 38.6* 37.7*  MCV 106.3* 103.6*  MCH 35.0* 34.9*  MCHC 32.9 33.7  RDW 12.8 12.9  PLT 320 296   BNP Recent Labs  Lab 12/26/20 0043  BNP 1,333.9*    DDimer  Recent Labs  Lab 12/25/20 2024  DDIMER 2.02*     Radiology/Studies:  DG Chest 2 View  Result Date: 12/25/2020 CLINICAL DATA:  Initial evaluation for shortness of breath for 2 weeks, fatigue. EXAM: CHEST - 2 VIEW COMPARISON:  Prior radiograph from 08/02/2019 FINDINGS: Cardiomegaly. Mediastinal silhouette within normal limits. Aortic atherosclerosis. Lungs normally inflated. Mild diffuse vascular and interstitial prominence consistent with mild pulmonary interstitial edema. Associated small bilateral pleural effusions, right greater than left. Associated mild bibasilar subsegmental atelectasis. No focal infiltrates. No pneumothorax. No acute osseous finding. IMPRESSION: 1. Cardiomegaly with mild diffuse pulmonary interstitial edema and small bilateral pleural effusions, right greater than left. 2.  Aortic Atherosclerosis (ICD10-I70.0). Electronically Signed   By: Rise Mu M.D.   On: 12/25/2020 21:20   CT Angio Chest PE W and/or Wo  Contrast  Result Date: 12/26/2020 CLINICAL DATA:  Increasing shortness of breath x2 weeks, fatigue, infected abdominal surgical wound with redness and drainage EXAM: CT ANGIOGRAPHY CHEST CT ABDOMEN AND PELVIS WITH CONTRAST TECHNIQUE: Multidetector CT imaging of the chest was performed using the standard protocol during bolus administration of intravenous contrast. Multiplanar CT image reconstructions and MIPs were obtained to evaluate the vascular anatomy. Multidetector CT imaging of the abdomen and pelvis was performed using the standard protocol during bolus administration of intravenous contrast. CONTRAST:  OMNIPAQUE IOHEXOL 350 MG/ML SOLN COMPARISON:  Chest radiographs dated 12/25/2020. CT abdomen/pelvis dated 12/02/2019. FINDINGS: CTA CHEST FINDINGS Cardiovascular: Satisfactory opacification the bilateral pulmonary arteries to the lobar level. Evaluation is mildly limited due to motion degradation. Within that constraint, there is no evidence of pulmonary embolism. Study is not tailored for evaluation of the thoracic aorta. No evidence of thoracic aortic aneurysm. Atherosclerotic calcifications of the aortic arch. Heart is top-normal in size.  No pericardial effusion. Mild three-vessel coronary atherosclerosis. Mediastinum/Nodes: No suspicious mediastinal lymphadenopathy. Visualized thyroid is unremarkable. Lungs/Pleura: Evaluation of the lung parenchyma is constrained by respiratory motion. Within that constraint, there are no suspicious pulmonary nodules. Mild interlobular septal thickening in the lungs bilaterally, lower lobe predominant, suggesting mild interstitial edema. No focal consolidation. Small to moderate right and small left pleural effusions. Associated lower lobe compressive atelectasis. No pneumothorax. Musculoskeletal: Very mild degenerative changes of the mid thoracic spine. Healing/healed right anterolateral 4th through 7th rib fracture deformities. Review of the MIP images confirms  the above findings. CT ABDOMEN and PELVIS FINDINGS Motion degraded images. Hepatobiliary: Liver is within normal limits. Layering gallbladder sludge (series 12/image 36), without associated inflammatory changes. No intrahepatic or extrahepatic ductal dilatation. Pancreas: Within normal limits. Spleen: Within normal limits. Adrenals/Urinary Tract: Adrenal glands are within normal limits. Kidneys are within normal limits.  No hydronephrosis. Stomach/Bowel: Stomach is within normal limits.  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 There is frank diastasis/laxity of the anterior abdominal wall with herniation of numerous loops of small bowel, which remain nondilated. Persistent overlying cutaneous thickening suggests cellulitis (series 12/image 87). Trace fluid beneath the skin surface, but without drainable fluid collection/abscess. Vascular/Lymphatic: No evidence of abdominal aortic aneurysm. Atherosclerotic calcifications of the abdominal aorta and branch vessels. No suspicious abdominopelvic lymphadenopathy. Reproductive: Prostate is unremarkable. Other: Trace subcutaneous fluid, as noted above, without discrete abdominopelvic ascites. Musculoskeletal: Mild degenerative changes of the lumbar spine. Review of the MIP images confirms the above findings. IMPRESSION: No evidence of pulmonary embolism. Mild interstitial edema. Small to moderate bilateral pleural effusions, right greater than left. Associated lower lobe compressive atelectasis. 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 Overlying skin thickening with trace subcutaneous fluid suggests cellulitis, but without drainable fluid collection/abscess. Electronically Signed   By: Charline Bills M.D.   On: 12/26/2020 00:36   CT ABDOMEN PELVIS W CONTRAST  Result Date:  12/26/2020 CLINICAL DATA:  Increasing shortness of breath x2 weeks, fatigue, infected abdominal surgical wound with redness and drainage EXAM: CT ANGIOGRAPHY CHEST CT ABDOMEN AND PELVIS WITH CONTRAST TECHNIQUE: Multidetector CT imaging of the chest was performed using the standard protocol during bolus administration of intravenous contrast. Multiplanar CT image reconstructions and MIPs were obtained to evaluate the vascular anatomy. Multidetector CT imaging of the abdomen and pelvis was performed using the standard protocol during bolus administration of intravenous contrast. CONTRAST:  OMNIPAQUE IOHEXOL 350 MG/ML SOLN COMPARISON:  Chest radiographs dated 12/25/2020. CT abdomen/pelvis dated 12/02/2019. FINDINGS: CTA CHEST FINDINGS Cardiovascular: Satisfactory opacification the bilateral pulmonary arteries to the lobar level. Evaluation is mildly limited due to motion degradation. Within that constraint, there is no evidence of pulmonary embolism. Study is not tailored for evaluation of the thoracic aorta. No evidence of thoracic aortic aneurysm. Atherosclerotic calcifications of the aortic arch. Heart is top-normal in size.  No pericardial effusion. Mild three-vessel coronary atherosclerosis. Mediastinum/Nodes: No suspicious mediastinal lymphadenopathy. Visualized thyroid is unremarkable. Lungs/Pleura: Evaluation of the lung parenchyma is constrained by respiratory motion. Within that constraint, there are no suspicious pulmonary nodules. Mild interlobular septal thickening in the lungs bilaterally, lower lobe predominant, suggesting mild interstitial edema. No focal consolidation. Small to moderate right and small left pleural effusions. Associated lower lobe compressive atelectasis. No pneumothorax. Musculoskeletal: Very mild degenerative changes of the mid thoracic spine. Healing/healed right anterolateral 4th through 7th rib fracture deformities. Review of the MIP images confirms the above findings. CT  ABDOMEN and PELVIS FINDINGS Motion degraded images. Hepatobiliary: Liver is within normal limits. Layering gallbladder sludge (series 12/image 36), without associated inflammatory changes. No intrahepatic or extrahepatic ductal dilatation. Pancreas: Within normal limits. Spleen: Within normal limits. Adrenals/Urinary Tract: Adrenal glands are within normal limits. Kidneys are within normal limits.  No hydronephrosis. Stomach/Bowel: Stomach is within normal limits. 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 There is frank diastasis/laxity of the anterior abdominal wall with herniation of numerous loops of small bowel, which remain nondilated. Persistent overlying cutaneous thickening suggests cellulitis (series 12/image 87). Trace fluid beneath the skin surface, but without drainable fluid collection/abscess. Vascular/Lymphatic: No evidence of abdominal aortic aneurysm. Atherosclerotic calcifications of the abdominal aorta and branch vessels. No suspicious abdominopelvic lymphadenopathy. Reproductive: Prostate is unremarkable. Other: Trace subcutaneous fluid, as noted above, without discrete abdominopelvic ascites. Musculoskeletal: Mild degenerative changes  of the lumbar spine. Review of the MIP images confirms the above findings. IMPRESSION: No evidence of pulmonary embolism. Mild interstitial edema. Small to moderate bilateral pleural effusions, right greater than left. Associated lower lobe compressive atelectasis. 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 Homero FellersFrank diastasis/laxity of the anterior abdominal wall with herniation of numerous loops of nondilated small bowel. Overlying skin thickening with trace subcutaneous fluid suggests cellulitis, but without drainable fluid collection/abscess. Electronically Signed   By: Charline BillsSriyesh  Krishnan M.D.   On: 12/26/2020 00:36     Assessment and Plan:   Complete heart block Unclear etiology. I am really  concerned that he may have developed endocarditis given his chronic infectious history.  He needs a TTE and TEE to investigate further. He is not a candidate at this point for a permanent transvenous pacemaker--his abomdinal infection needs to be addressed first. - if escape rhythm becomes unstable, he will require activation of the cath lab for a temporary transvenous pacemaker wire  2. Abdominal Wound Infected. Concerned that infection may be widespread in his peritoneum. Concerned that he may have developed endocarditis as a complication. - ID and general surgery consults - will need imaging -- gen surg to specify which type - Bcx x 2 pending - lactic acid elevated  EP to follow.   For questions or updates, please contact [facility] Please consult [email or web address] for contact info under    Signed, Lanier PrudeAMERON T Larnce Schnackenberg, MD  12/26/2020 8:27 AM

## 2020-12-26 NOTE — Plan of Care (Signed)

## 2020-12-26 NOTE — Progress Notes (Signed)
Dr. Welton Flakes notified about pt's recent Lactic results of 2.1 and 2.5. Informed MD all blood cultures drawn as ordered, and pt received ordered Vancomycin IVPB  afterwards.

## 2020-12-26 NOTE — Progress Notes (Signed)
Pharmacy Antibiotic Note  Luis [name] is a 60 y.o. male admitted on 12/25/2020 with abdominal cellulitis/wound w/ fistula.  Pharmacy has been consulted for daptomycin and piperacillin/tazobactam  dosing.  Concern for endocarditis given unexplained heart block, pending work up. Good renal function, ClCr ~69ml /min. Last vancomycin dose at 11am.  WBC wnl. LA to 2.5. TTE negative for vegetations.    Plan: Daptomycin 8mg /kg = 800mg  q24 hr Pip/tazo 3.375g Q8h EI Monitor cultures, clinical status, renal fx, weekly CK Narrow abx as able and f/u duration    Height: 6\' 1"  (185.4 cm) Weight: 98.9 kg (218 lb 0.6 oz) IBW/kg (Calculated) : 79.9  Temp (24hrs), Avg:98.1 F (36.7 C), Min:97.9 F (36.6 C), Max:98.8 F (37.1 C)  Recent Labs  Lab 12/25/20 2024 12/26/20 0043 12/26/20 0144 12/26/20 0649  WBC 10.1  --  10.0  --   CREATININE 1.32*  --  1.26*  --   LATICACIDVEN  --  2.1* 2.5* 2.5*    Estimated Creatinine Clearance: 76.2 mL/min (A) (by C-G formula based on SCr of 1.26 mg/dL (H)).    Allergies  Allergen Reactions   Codeine Other (See Comments)    unknown    Antimicrobials this admission: Vanc 7/16  Dapto 7/16 >> Pip/tazo 7/16 >>   7/16 BL CK 112  Microbiology results: 7/16 BCx: pend 7/16 wound: pend     Thank you for allowing pharmacy to be a part of this patient's care.  8/16, PharmD, BCPS, BCCP Clinical Pharmacist  Please check AMION for all [facility] phone numbers After 10:00 PM, call Main Pharmacy [phone]

## 2020-12-27 DIAGNOSIS — I442 Atrioventricular block, complete: Secondary | ICD-10-CM | POA: Diagnosis not present

## 2020-12-27 DIAGNOSIS — S31109D Unspecified open wound of abdominal wall, unspecified quadrant without penetration into peritoneal cavity, subsequent encounter: Secondary | ICD-10-CM

## 2020-12-27 DIAGNOSIS — F10230 Alcohol dependence with withdrawal, uncomplicated: Secondary | ICD-10-CM

## 2020-12-27 DIAGNOSIS — E119 Type 2 diabetes mellitus without complications: Secondary | ICD-10-CM | POA: Diagnosis not present

## 2020-12-27 DIAGNOSIS — L089 Local infection of the skin and subcutaneous tissue, unspecified: Secondary | ICD-10-CM

## 2020-12-27 LAB — HEPATITIS B SURFACE ANTIGEN: Hepatitis B Surface Ag: NONREACTIVE

## 2020-12-27 LAB — GLUCOSE, CAPILLARY
Glucose-Capillary: 106 mg/dL — ABNORMAL HIGH (ref 70–99)
Glucose-Capillary: 104 mg/dL — ABNORMAL HIGH (ref 70–99)
Glucose-Capillary: 106 mg/dL — ABNORMAL HIGH (ref 70–99)
Glucose-Capillary: 128 mg/dL — ABNORMAL HIGH (ref 70–99)

## 2020-12-27 LAB — SEDIMENTATION RATE: Sed Rate: 62 mm/hr — ABNORMAL HIGH (ref 0–16)

## 2020-12-27 LAB — C-REACTIVE PROTEIN: CRP: 7 mg/dL — ABNORMAL HIGH (ref <1.0)

## 2020-12-27 NOTE — Progress Notes (Signed)
Subjective: No new complaints   Antibiotics:  Anti-infectives (From admission, onward)    Start     Dose/Rate Route Frequency Ordered Stop   12/26/20 1418  DAPTOmycin (CUBICIN) 800 mg in sodium chloride 0.9 % IVPB        800 mg 132 mL/hr over 30 Minutes Intravenous Daily 12/26/20 1419     12/26/20 1300  piperacillin-tazobactam (ZOSYN) IVPB 3.375 g        3.375 g 12.5 mL/hr over 240 Minutes Intravenous Every 8 hours 12/26/20 1203     12/26/20 1300  DAPTOmycin (CUBICIN) 500 mg in sodium chloride 0.9 % IVPB  Status:  Discontinued        500 mg 120 mL/hr over 30 Minutes Intravenous Daily 12/26/20 1203 12/26/20 1419   12/26/20 1000  vancomycin (VANCOCIN) IVPB 1000 mg/200 mL premix  Status:  Discontinued        1,000 mg 200 mL/hr over 60 Minutes Intravenous Every 12 hours 12/26/20 0050 12/26/20 1122   12/26/20 0100  vancomycin (VANCOREADY) IVPB 1500 mg/300 mL        1,500 mg 150 mL/hr over 120 Minutes Intravenous  Once 12/26/20 0048 12/26/20 0409       Medications: Scheduled Meds:  aspirin EC  81 mg Oral Daily   enoxaparin (LOVENOX) injection  40 mg Subcutaneous Q24H   folic acid  1 mg Oral Daily   insulin aspart  0-15 Units Subcutaneous TID WC   insulin aspart  0-5 Units Subcutaneous QHS   mouth rinse  15 mL Mouth Rinse BID   multivitamin with minerals  1 tablet Oral Daily   nicotine  21 mg Transdermal Daily   thiamine  100 mg Oral Daily   Or   thiamine  100 mg Intravenous Daily   Continuous Infusions:  DAPTOmycin (CUBICIN)  IV 800 mg (12/26/20 2028)   DOPamine     piperacillin-tazobactam (ZOSYN)  IV 3.375 g (12/27/20 0445)   PRN Meds:.acetaminophen, LORazepam **OR** LORazepam, nitroGLYCERIN, ondansetron (ZOFRAN) IV, traMADol    Objective: Weight change:   Intake/Output Summary (Last 24 hours) at 12/27/2020 1309 Last data filed at 12/27/2020 1107 Gross per 24 hour  Intake 1024.55 ml  Output 1775 ml  Net -750.45 ml   Blood pressure (!) 164/70, pulse  (!) 47, temperature 99.2 F (37.3 C), temperature source Oral, resp. rate 20, height  (1.854 m), weight 98.9 kg, SpO2 96 %. Temp:  [98.1 F (36.7 C)-99.2 F (37.3 C)] 99.2 F (37.3 C) (07/17 1108) Pulse Rate:  [40-60] 47 (07/17 1108) Resp:  [15-20] 20 (07/17 1108) BP: (148-164)/(67-79) 164/70 (07/17 1108) SpO2:  [93 %-98 %] 96 % (07/17 1108)  Physical Exam: Physical Exam Constitutional:      Appearance: He is well-developed.  HENT:     Head: Normocephalic and atraumatic.  Eyes:     Conjunctiva/sclera: Conjunctivae normal.  Cardiovascular:     Rate and Rhythm: Regular rhythm. Bradycardia present.     Heart sounds: No murmur heard.   No friction rub. No gallop.  Pulmonary:     Effort: Pulmonary effort is normal. No respiratory distress.     Breath sounds: Normal breath sounds. No stridor. No wheezing.  Abdominal:     General: There is no distension.     Palpations: Abdomen is soft.  Musculoskeletal:        General: Normal range of motion.     Cervical back: Normal range of motion and neck supple.  Skin:  General: Skin is warm and dry.     Findings: No erythema or rash.  Neurological:     General: No focal deficit present.     Mental Status: He is alert and oriented to person, place, and time.  Psychiatric:        Mood and Affect: Mood is depressed.        Behavior: Behavior normal.        Thought Content: Thought content normal.        Judgment: Judgment normal.     Abdominal wound not examined today   CBC:    BMET Recent Labs    12/25/20 2024 12/26/20 0144  NA 132* 132*  K 5.5* 5.1  CL 103 103  CO2 17* 15*  GLUCOSE 99 87  BUN 26* 28*  CREATININE 1.32* 1.26*  CALCIUM 8.9 8.7*     Liver Panel  Recent Labs    12/25/20 2024  PROT 7.4  ALBUMIN 3.4*  AST 33  ALT 29  ALKPHOS 124  BILITOT 0.9       Sedimentation Rate Recent Labs    12/27/20 0127  ESRSEDRATE 62*   C-Reactive Protein Recent Labs    12/27/20 0127  CRP 7.0*     Micro Results: Recent Results (from the past 720 hour(s))  Resp Panel by RT-PCR (Flu A&B, Covid) Nasopharyngeal Swab     Status: None   Collection Time: 12/25/20  9:35 PM   Specimen: Nasopharyngeal Swab; Nasopharyngeal(NP) swabs in vial transport medium  Result Value Ref Range Status   SARS Coronavirus 2 by RT PCR NEGATIVE NEGATIVE Final    Comment: (NOTE) SARS-CoV-2 target nucleic acids are NOT DETECTED.  The SARS-CoV-2 RNA is generally detectable in upper respiratory specimens during the acute phase of infection. The lowest concentration of SARS-CoV-2 viral copies this assay can detect is 138 copies/mL. A negative result does not preclude SARS-Cov-2 infection and should not be used as the sole basis for treatment or other patient management decisions. A negative result may occur with  improper specimen collection/handling, submission of specimen other than nasopharyngeal swab, presence of viral mutation(s) within the areas targeted by this assay, and inadequate number of viral copies(<138 copies/mL). A negative result must be combined with clinical observations, patient history, and epidemiological information. The expected result is Negative.  Fact Sheet for Patients:  BloggerCourse.com  Fact Sheet for Healthcare Providers:  SeriousBroker.it  This test is no t yet approved or cleared by the [place] FDA and  has been authorized for detection and/or diagnosis of SARS-CoV-2 by FDA under an Emergency Use Authorization (EUA). This EUA will remain  in effect (meaning this test can be used) for the duration of the COVID-19 declaration under Section 564(b)(1) of the Act, 21 U.S.C.section 360bbb-3(b)(1), unless the authorization is terminated  or revoked sooner.       Influenza A by PCR NEGATIVE NEGATIVE Final   Influenza B by PCR NEGATIVE NEGATIVE Final    Comment: (NOTE) The Xpert Xpress SARS-CoV-2/FLU/RSV plus assay  is intended as an aid in the diagnosis of influenza from Nasopharyngeal swab specimens and should not be used as a sole basis for treatment. Nasal washings and aspirates are unacceptable for Xpert Xpress SARS-CoV-2/FLU/RSV testing.  Fact Sheet for Patients: BloggerCourse.com  Fact Sheet for Healthcare Providers: SeriousBroker.it  This test is not yet approved or cleared by the [place] FDA and has been authorized for detection and/or diagnosis of SARS-CoV-2 by FDA under an Emergency Use Authorization (EUA). This EUA  will remain in effect (meaning this test can be used) for the duration of the COVID-19 declaration under Section 564(b)(1) of the Act, 21 U.S.C. section 360bbb-3(b)(1), unless the authorization is terminated or revoked.  Performed at [facility], [address], [place], [place] [address]     Studies/Results: DG Chest 2 View  Result Date: 12/25/2020 CLINICAL DATA:  Initial evaluation for shortness of breath for 2 weeks, fatigue. EXAM: CHEST - 2 VIEW COMPARISON:  Prior radiograph from 08/02/2019 FINDINGS: Cardiomegaly. Mediastinal silhouette within normal limits. Aortic atherosclerosis. Lungs normally inflated. Mild diffuse vascular and interstitial prominence consistent with mild pulmonary interstitial edema. Associated small bilateral pleural effusions, right greater than left. Associated mild bibasilar subsegmental atelectasis. No focal infiltrates. No pneumothorax. No acute osseous finding. IMPRESSION: 1. Cardiomegaly with mild diffuse pulmonary interstitial edema and small bilateral pleural effusions, right greater than left. 2.  Aortic Atherosclerosis (ICD10-I70.0). Electronically Signed   By: Rise Mu M.D.   On: 12/25/2020 21:20   CT Angio Chest PE W and/or Wo Contrast  Result Date: 12/26/2020 CLINICAL DATA:  Increasing shortness of breath x2 weeks, fatigue, infected abdominal surgical wound  with redness and drainage EXAM: CT ANGIOGRAPHY CHEST CT ABDOMEN AND PELVIS WITH CONTRAST TECHNIQUE: Multidetector CT imaging of the chest was performed using the standard protocol during bolus administration of intravenous contrast. Multiplanar CT image reconstructions and MIPs were obtained to evaluate the vascular anatomy. Multidetector CT imaging of the abdomen and pelvis was performed using the standard protocol during bolus administration of intravenous contrast. CONTRAST:  OMNIPAQUE IOHEXOL 350 MG/ML SOLN COMPARISON:  Chest radiographs dated 12/25/2020. CT abdomen/pelvis dated 12/02/2019. FINDINGS: CTA CHEST FINDINGS Cardiovascular: Satisfactory opacification the bilateral pulmonary arteries to the lobar level. Evaluation is mildly limited due to motion degradation. Within that constraint, there is no evidence of pulmonary embolism. Study is not tailored for evaluation of the thoracic aorta. No evidence of thoracic aortic aneurysm. Atherosclerotic calcifications of the aortic arch. Heart is top-normal in size.  No pericardial effusion. Mild three-vessel coronary atherosclerosis. Mediastinum/Nodes: No suspicious mediastinal lymphadenopathy. Visualized thyroid is unremarkable. Lungs/Pleura: Evaluation of the lung parenchyma is constrained by respiratory motion. Within that constraint, there are no suspicious pulmonary nodules. Mild interlobular septal thickening in the lungs bilaterally, lower lobe predominant, suggesting mild interstitial edema. No focal consolidation. Small to moderate right and small left pleural effusions. Associated lower lobe compressive atelectasis. No pneumothorax. Musculoskeletal: Very mild degenerative changes of the mid thoracic spine. Healing/healed right anterolateral 4th through 7th rib fracture deformities. Review of the MIP images confirms the above findings. CT ABDOMEN and PELVIS FINDINGS Motion degraded images. Hepatobiliary: Liver is within normal limits. Layering  gallbladder sludge (series 12/image 36), without associated inflammatory changes. No intrahepatic or extrahepatic ductal dilatation. Pancreas: Within normal limits. Spleen: Within normal limits. Adrenals/Urinary Tract: Adrenal glands are within normal limits. Kidneys are within normal limits.  No hydronephrosis. Stomach/Bowel: Stomach is within normal limits. 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 There is frank diastasis/laxity of the anterior abdominal wall with herniation of numerous loops of small bowel, which remain nondilated. Persistent overlying cutaneous thickening suggests cellulitis (series 12/image 87). Trace fluid beneath the skin surface, but without drainable fluid collection/abscess. Vascular/Lymphatic: No evidence of abdominal aortic aneurysm. Atherosclerotic calcifications of the abdominal aorta and branch vessels. No suspicious abdominopelvic lymphadenopathy. Reproductive: Prostate is unremarkable. Other: Trace subcutaneous fluid, as noted above, without discrete abdominopelvic ascites. Musculoskeletal: Mild degenerative changes of the lumbar spine. Review of the MIP  images confirms the above findings. IMPRESSION: No evidence of pulmonary embolism. Mild interstitial edema. Small to moderate bilateral pleural effusions, right greater than left. Associated lower lobe compressive atelectasis. 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 Homero FellersFrank diastasis/laxity of the anterior abdominal wall with herniation of numerous loops of nondilated small bowel. Overlying skin thickening with trace subcutaneous fluid suggests cellulitis, but without drainable fluid collection/abscess. Electronically Signed   By: Charline BillsSriyesh  Krishnan M.D.   On: 12/26/2020 00:36   CT ABDOMEN PELVIS W CONTRAST  Result Date: 12/26/2020 CLINICAL DATA:  Increasing shortness of breath x2 weeks, fatigue, infected abdominal surgical wound with redness and drainage EXAM:  CT ANGIOGRAPHY CHEST CT ABDOMEN AND PELVIS WITH CONTRAST TECHNIQUE: Multidetector CT imaging of the chest was performed using the standard protocol during bolus administration of intravenous contrast. Multiplanar CT image reconstructions and MIPs were obtained to evaluate the vascular anatomy. Multidetector CT imaging of the abdomen and pelvis was performed using the standard protocol during bolus administration of intravenous contrast. CONTRAST:  100mL OMNIPAQUE IOHEXOL 350 MG/ML SOLN COMPARISON:  Chest radiographs dated 12/25/2020. CT abdomen/pelvis dated 12/02/2019. FINDINGS: CTA CHEST FINDINGS Cardiovascular: Satisfactory opacification the bilateral pulmonary arteries to the lobar level. Evaluation is mildly limited due to motion degradation. Within that constraint, there is no evidence of pulmonary embolism. Study is not tailored for evaluation of the thoracic aorta. No evidence of thoracic aortic aneurysm. Atherosclerotic calcifications of the aortic arch. Heart is top-normal in size.  No pericardial effusion. Mild three-vessel coronary atherosclerosis. Mediastinum/Nodes: No suspicious mediastinal lymphadenopathy. Visualized thyroid is unremarkable. Lungs/Pleura: Evaluation of the lung parenchyma is constrained by respiratory motion. Within that constraint, there are no suspicious pulmonary nodules. Mild interlobular septal thickening in the lungs bilaterally, lower lobe predominant, suggesting mild interstitial edema. No focal consolidation. Small to moderate right and small left pleural effusions. Associated lower lobe compressive atelectasis. No pneumothorax. Musculoskeletal: Very mild degenerative changes of the mid thoracic spine. Healing/healed right anterolateral 4th through 7th rib fracture deformities. Review of the MIP images confirms the above findings. CT ABDOMEN and PELVIS FINDINGS Motion degraded images. Hepatobiliary: Liver is within normal limits. Layering gallbladder sludge (series 12/image  36), without associated inflammatory changes. No intrahepatic or extrahepatic ductal dilatation. Pancreas: Within normal limits. Spleen: Within normal limits. Adrenals/Urinary Tract: Adrenal glands are within normal limits. Kidneys are within normal limits.  No hydronephrosis. Stomach/Bowel: Stomach is within normal limits. 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 There is frank diastasis/laxity of the anterior abdominal wall with herniation of numerous loops of small bowel, which remain nondilated. Persistent overlying cutaneous thickening suggests cellulitis (series 12/image 87). Trace fluid beneath the skin surface, but without drainable fluid collection/abscess. Vascular/Lymphatic: No evidence of abdominal aortic aneurysm. Atherosclerotic calcifications of the abdominal aorta and branch vessels. No suspicious abdominopelvic lymphadenopathy. Reproductive: Prostate is unremarkable. Other: Trace subcutaneous fluid, as noted above, without discrete abdominopelvic ascites. Musculoskeletal: Mild degenerative changes of the lumbar spine. Review of the MIP images confirms the above findings. IMPRESSION: No evidence of pulmonary embolism. Mild interstitial edema. Small to moderate bilateral pleural effusions, right greater than left. Associated lower lobe compressive atelectasis. 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 Homero FellersFrank diastasis/laxity of the anterior abdominal wall with herniation of numerous loops of nondilated small bowel. Overlying skin thickening with trace subcutaneous fluid suggests cellulitis, but without drainable fluid collection/abscess. Electronically Signed   By: Charline BillsSriyesh  Krishnan M.D.   On: 12/26/2020 00:36   ECHOCARDIOGRAM COMPLETE  Result Date: 12/26/2020  ECHOCARDIOGRAM REPORT   Patient Name:   OSWELL SAY Date of Exam: 12/26/2020 Medical Rec #:  [id number]     Height:       73.0 in Accession #:    [id number]    Weight:        218.0 lb Date of Birth:  [birth year]     BSA:          2.232 m Patient Age:    60 years      BP:           142/72 mmHg Patient Gender: M             HR:           42 bpm. Exam Location:  Inpatient Procedure: 2D Echo, Cardiac Doppler, Color Doppler and Intracardiac            Opacification Agent                     STAT ECHO Reported to: Dr Royann Shivers on 12/26/2020 10:05:00 AM. Indications:    Dyspnea R06.00  History:        Patient has no prior history of Echocardiogram examinations.                 Risk Factors:Hypertension, Diabetes, Dyslipidemia and Current                 Smoker. ETOH abuse. DVT.  Sonographer:    Tiffany Dance Referring Phys: [id number] HAO MENG IMPRESSIONS  1. Left ventricular ejection fraction, by estimation, is 65 to 70%. The left ventricle has normal function. The left ventricle has no regional wall motion abnormalities. There is moderate concentric left ventricular hypertrophy. Diastolic function cannot be evaluated due to AV dissociation, but annulus early diastolic velocities are normal.  2. Right ventricular systolic function is normal. The right ventricular size is normal. There is normal pulmonary artery systolic pressure.  3. Left atrial size was moderately dilated.  4. 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 The mitral valve is normal in structure. Mild mitral valve regurgitation.  5. The aortic valve is tricuspid. There is mild calcification of the aortic valve. Aortic valve regurgitation is not visualized. Mild to moderate aortic valve sclerosis/calcification is present, without any evidence of aortic stenosis.  6. Aortic dilatation noted. There is borderline dilatation of the aortic root, measuring 38 mm.  7. The inferior vena cava is dilated in size with >50% respiratory variability, suggesting right atrial pressure of 8 mmHg. Conclusion(s)/Recommendation(s): No evidence of valvular vegetations on this transthoracic echocardiogram. Would recommend a  transesophageal echocardiogram to exclude infective endocarditis if clinically indicated. The rhythm appears to be complete heart  block with junctional escape rhythm. FINDINGS  Left Ventricle: Left ventricular ejection fraction, by estimation, is 65 to 70%. The left ventricle has normal function. The left ventricle has no regional wall motion abnormalities. Definity contrast agent was given IV to delineate the left ventricular  endocardial borders. The left ventricular internal cavity size was normal in size. There is moderate concentric left ventricular hypertrophy. Diastolic function cannot be evaluated due to AV dissociation, but annulus early diastolic velocities are normal. Right Ventricle: The right ventricular size is normal. No increase in right ventricular wall thickness. Right ventricular systolic function is normal. There is normal pulmonary artery systolic pressure. The tricuspid regurgitant velocity is 2.46 m/s, and  with an assumed right atrial pressure of 8 mmHg, the estimated right ventricular systolic pressure is 32.2 mmHg.  Left Atrium: Left atrial size was moderately dilated. Right Atrium: Right atrial size was normal in size. Pericardium: There is no evidence of pericardial effusion. 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 The mitral valve is normal in structure. Mild mitral annular calcification. Mild mitral valve regurgitation, with centrally-directed jet. Tricuspid Valve: The tricuspid valve is normal in structure. Tricuspid valve regurgitation is mild. Aortic Valve: The aortic valve is tricuspid. There is mild calcification of the aortic valve. Aortic valve regurgitation is not visualized. Mild to moderate aortic valve sclerosis/calcification is present, without any evidence of aortic stenosis. Aortic valve mean gradient measures 22.0 mmHg. Aortic valve peak gradient measures 38.6 mmHg. Pulmonic Valve: The pulmonic valve was normal in structure.  Pulmonic valve regurgitation is trivial. Aorta: Aortic dilatation noted. There is borderline dilatation of the aortic root, measuring 38 mm. Venous: The inferior vena cava is dilated in size with greater than 50% respiratory variability, suggesting right atrial pressure of 8 mmHg. IAS/Shunts: No atrial level shunt detected by color flow Doppler.  LEFT VENTRICLE PLAX 2D LVIDd:         4.80 cm  Diastology LVIDs:         3.00 cm  LV e' medial:    11.70 cm/s LV PW:         1.50 cm  LV E/e' medial:  15.9 LV IVS:        1.60 cm  LV e' lateral:   13.80 cm/s LVOT diam:     2.50 cm  LV E/e' lateral: 13.5 LVOT Area:     4.91 cm  RIGHT VENTRICLE             IVC RV Basal diam:  3.30 cm     IVC diam: 2.30 cm RV Mid diam:    2.60 cm RV S prime:     14.70 cm/s TAPSE (M-mode): 2.3 cm LEFT ATRIUM              Index       RIGHT ATRIUM           Index LA diam:        4.90 cm  2.20 cm/m  RA Area:     18.10 cm LA Vol (A2C):   120.0 ml 53.76 ml/m RA Volume:   47.30 ml  21.19 ml/m LA Vol (A4C):   74.9 ml  33.56 ml/m LA Biplane Vol: 96.3 ml  43.14 ml/m  AORTIC VALVE AV Vmax:      310.50 cm/s AV Vmean:     214.000 cm/s AV VTI:       0.651 m AV Peak Grad: 38.6 mmHg AV Mean Grad: 22.0 mmHg  AORTA Ao Root diam: 3.80 cm Ao Asc diam:  3.60 cm MITRAL VALVE                TRICUSPID VALVE MV Area (PHT): 3.99 cm     TR Peak grad:   24.2 mmHg MV Decel Time: 190 msec     TR Vmax:        246.00 cm/s MV E velocity: 186.00 cm/s MV A velocity: 100.00 cm/s  SHUNTS MV E/A ratio:  1.86         Systemic Diam: 2.50 cm Mihai Croitoru MD Electronically signed by Thurmon Fair MD Signature Date/Time: 12/26/2020/10:56:20 AM    Final       Assessment/Plan:  INTERVAL HISTORY: blood cultures sent   Principal Problem:   Complete heart block (HCC) Active Problems:  DM2 (diabetes mellitus, type 2) (HCC)   Chronic abdominal wound infection, subsequent encounter   Alcohol dependence with uncomplicated withdrawal (HCC)   Tobacco dependence    Essential hypertension    Neftali Abair is a 60 y.o. male with history of morbid obesity diabetes mellitus hypertension diverticulitis with perforation status post colectomy and end ileostomy who developed abdominal wall hernia and has been followed by Dr. [name] Dr. Arita Miss and Dr. Mikey Bussing.  He was admitted now for work-up for shortness of breath and was found to be bradycardic with high-grade AV block.  CT and abdomen pelvis was unrevealing.  Blood cultures were sent  There is concern that he could have infectious endocarditis as a cause of his complete heart block.  2D echocardiogram does not show vegetations but clearly not an optimal study.  He is going to get TEE and also cMR  #1 Heart block: unclear etiology. I doubt he is bacteremic and has endocarditis but he is on broad spectrum antibiiotics while blood cultures intubate and while we await TEE  He has no history to suggest infection with Borrelia but I sent Lyme serologies anyway.  #2 Abdominal hernia with chronic wound: This does not appear overtly infected to me.  Attempts to resolve this have been documented by plastic surgery General surgery and wound care.   LOS: 1 day   Acey Lav 12/27/2020, 1:09 PM

## 2020-12-27 NOTE — Progress Notes (Signed)
EP Progress Note  Patient Name: Luis [name] Date of Encounter: 12/27/2020  [facility] Cardiologist: [name]   Subjective   NAEO. Remains in CHB.  Inpatient Medications    Scheduled Meds:  aspirin EC  81 mg Oral Daily   enoxaparin (LOVENOX) injection  40 mg Subcutaneous Q24H   folic acid  1 mg Oral Daily   insulin aspart  0-15 Units Subcutaneous TID WC   insulin aspart  0-5 Units Subcutaneous QHS   mouth rinse  15 mL Mouth Rinse BID   multivitamin with minerals  1 tablet Oral Daily   nicotine  21 mg Transdermal Daily   thiamine  100 mg Oral Daily   Or   thiamine  100 mg Intravenous Daily   Continuous Infusions:  DAPTOmycin (CUBICIN)  IV 800 mg (12/26/20 2028)   DOPamine     piperacillin-tazobactam (ZOSYN)  IV 3.375 g (12/27/20 0445)   PRN Meds: acetaminophen, LORazepam **OR** LORazepam, nitroGLYCERIN, ondansetron (ZOFRAN) IV, traMADol   Vital Signs    Vitals:   12/26/20 2111 12/27/20 0018 12/27/20 0519 12/27/20 0725  BP: (!) 154/79 (!) 162/67 (!) 153/71 (!) 148/79  Pulse: 60 (!) 57 60 (!) 40  Resp: 20 20 20 19   Temp: 98.1 F (36.7 C) 98.1 F (36.7 C) 98.1 F (36.7 C) 98.3 F (36.8 C)  TempSrc: Oral Oral Oral Oral  SpO2: 96% 95% 93% 93%  Weight:      Height:        Intake/Output Summary (Last 24 hours) at 12/27/2020 0744 Last data filed at 12/27/2020 0601 Gross per 24 hour  Intake 1024.55 ml  Output 1950 ml  Net -925.45 ml   Last 3 Weights 12/26/2020 12/25/2020 01/27/2020  Weight (lbs) 218 lb 0.6 oz 209 lb 7 oz 177 lb  Weight (kg) 98.9 kg 95 kg 80.287 kg      Telemetry    CHB - Personally Reviewed  ECG    No new - Personally Reviewed  Physical Exam   GEN: No acute distress.   Neck: No JVD Cardiac: RRR, no murmurs, rubs, or gallops. Skin overlying RFV access site is clear of obvious infection. Respiratory: Clear to auscultation bilaterally. GI: Soft, nontender, non-distended  MS: No edema; No deformity. Neuro:  Nonfocal   Psych: Normal affect   Labs    High Sensitivity Troponin:   Recent Labs  Lab 12/25/20 2024 12/25/20 2224  TROPONINIHS 44* 44*      Chemistry Recent Labs  Lab 12/25/20 2024 12/26/20 0144  NA 132* 132*  K 5.5* 5.1  CL 103 103  CO2 17* 15*  GLUCOSE 99 87  BUN 26* 28*  CREATININE 1.32* 1.26*  CALCIUM 8.9 8.7*  PROT 7.4  --   ALBUMIN 3.4*  --   AST 33  --   ALT 29  --   ALKPHOS 124  --   BILITOT 0.9  --   GFRNONAA >60 >60  ANIONGAP 12 14     Hematology Recent Labs  Lab 12/25/20 2024 12/26/20 0144  WBC 10.1 10.0  RBC 3.63* 3.64*  HGB 12.7* 12.7*  HCT 38.6* 37.7*  MCV 106.3* 103.6*  MCH 35.0* 34.9*  MCHC 32.9 33.7  RDW 12.8 12.9  PLT 320 296    BNP Recent Labs  Lab 12/26/20 0043  BNP 1,333.9*     DDimer  Recent Labs  Lab 12/25/20 2024  DDIMER 2.02*     Radiology    DG Chest 2 View  Result Date:  12/25/2020 CLINICAL DATA:  Initial evaluation for shortness of breath for 2 weeks, fatigue. EXAM: CHEST - 2 VIEW COMPARISON:  Prior radiograph from 08/02/2019 FINDINGS: Cardiomegaly. Mediastinal silhouette within normal limits. Aortic atherosclerosis. Lungs normally inflated. Mild diffuse vascular and interstitial prominence consistent with mild pulmonary interstitial edema. Associated small bilateral pleural effusions, right greater than left. Associated mild bibasilar subsegmental atelectasis. No focal infiltrates. No pneumothorax. No acute osseous finding. IMPRESSION: 1. Cardiomegaly with mild diffuse pulmonary interstitial edema and small bilateral pleural effusions, right greater than left. 2.  Aortic Atherosclerosis (ICD10-I70.0). Electronically Signed   By: Rise Mu M.D.   On: 12/25/2020 21:20   CT Angio Chest PE W and/or Wo Contrast  Result Date: 12/26/2020 CLINICAL DATA:  Increasing shortness of breath x2 weeks, fatigue, infected abdominal surgical wound with redness and drainage EXAM: CT ANGIOGRAPHY CHEST CT ABDOMEN AND PELVIS WITH  CONTRAST TECHNIQUE: Multidetector CT imaging of the chest was performed using the standard protocol during bolus administration of intravenous contrast. Multiplanar CT image reconstructions and MIPs were obtained to evaluate the vascular anatomy. Multidetector CT imaging of the abdomen and pelvis was performed using the standard protocol during bolus administration of intravenous contrast. CONTRAST:  OMNIPAQUE IOHEXOL 350 MG/ML SOLN COMPARISON:  Chest radiographs dated 12/25/2020. CT abdomen/pelvis dated 12/02/2019. FINDINGS: CTA CHEST FINDINGS Cardiovascular: Satisfactory opacification the bilateral pulmonary arteries to the lobar level. Evaluation is mildly limited due to motion degradation. Within that constraint, there is no evidence of pulmonary embolism. Study is not tailored for evaluation of the thoracic aorta. No evidence of thoracic aortic aneurysm. Atherosclerotic calcifications of the aortic arch. Heart is top-normal in size.  No pericardial effusion. Mild three-vessel coronary atherosclerosis. Mediastinum/Nodes: No suspicious mediastinal lymphadenopathy. Visualized thyroid is unremarkable. Lungs/Pleura: Evaluation of the lung parenchyma is constrained by respiratory motion. Within that constraint, there are no suspicious pulmonary nodules. Mild interlobular septal thickening in the lungs bilaterally, lower lobe predominant, suggesting mild interstitial edema. No focal consolidation. Small to moderate right and small left pleural effusions. Associated lower lobe compressive atelectasis. No pneumothorax. Musculoskeletal: Very mild degenerative changes of the mid thoracic spine. Healing/healed right anterolateral 4th through 7th rib fracture deformities. Review of the MIP images confirms the above findings. CT ABDOMEN and PELVIS FINDINGS Motion degraded images. Hepatobiliary: Liver is within normal limits. Layering gallbladder sludge (series 12/image 36), without associated inflammatory changes. No  intrahepatic or extrahepatic ductal dilatation. Pancreas: Within normal limits. Spleen: Within normal limits. Adrenals/Urinary Tract: Adrenal glands are within normal limits. Kidneys are within normal limits.  No hydronephrosis. Stomach/Bowel: Stomach is within normal limits. 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 There is frank diastasis/laxity of the anterior abdominal wall with herniation of numerous loops of small bowel, which remain nondilated. Persistent overlying cutaneous thickening suggests cellulitis (series 12/image 87). Trace fluid beneath the skin surface, but without drainable fluid collection/abscess. Vascular/Lymphatic: No evidence of abdominal aortic aneurysm. Atherosclerotic calcifications of the abdominal aorta and branch vessels. No suspicious abdominopelvic lymphadenopathy. Reproductive: Prostate is unremarkable. Other: Trace subcutaneous fluid, as noted above, without discrete abdominopelvic ascites. Musculoskeletal: Mild degenerative changes of the lumbar spine. Review of the MIP images confirms the above findings. IMPRESSION: No evidence of pulmonary embolism. Mild interstitial edema. Small to moderate bilateral pleural effusions, right greater than left. Associated lower lobe compressive atelectasis. 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 Overlying skin thickening with trace subcutaneous  fluid suggests cellulitis, but without drainable fluid collection/abscess. Electronically Signed   By: Charline BillsSriyesh  Krishnan M.D.   On: 12/26/2020 00:36   CT ABDOMEN PELVIS W CONTRAST  Result Date: 12/26/2020 CLINICAL DATA:  Increasing shortness of breath x2 weeks, fatigue, infected abdominal surgical wound with redness and drainage EXAM: CT ANGIOGRAPHY CHEST CT ABDOMEN AND PELVIS WITH CONTRAST TECHNIQUE: Multidetector CT  imaging of the chest was performed using the standard protocol during bolus administration of intravenous contrast. Multiplanar CT image reconstructions and MIPs were obtained to evaluate the vascular anatomy. Multidetector CT imaging of the abdomen and pelvis was performed using the standard protocol during bolus administration of intravenous contrast. CONTRAST:  100mL OMNIPAQUE IOHEXOL 350 MG/ML SOLN COMPARISON:  Chest radiographs dated 12/25/2020. CT abdomen/pelvis dated 12/02/2019. FINDINGS: CTA CHEST FINDINGS Cardiovascular: Satisfactory opacification the bilateral pulmonary arteries to the lobar level. Evaluation is mildly limited due to motion degradation. Within that constraint, there is no evidence of pulmonary embolism. Study is not tailored for evaluation of the thoracic aorta. No evidence of thoracic aortic aneurysm. Atherosclerotic calcifications of the aortic arch. Heart is top-normal in size.  No pericardial effusion. Mild three-vessel coronary atherosclerosis. Mediastinum/Nodes: No suspicious mediastinal lymphadenopathy. Visualized thyroid is unremarkable. Lungs/Pleura: Evaluation of the lung parenchyma is constrained by respiratory motion. Within that constraint, there are no suspicious pulmonary nodules. Mild interlobular septal thickening in the lungs bilaterally, lower lobe predominant, suggesting mild interstitial edema. No focal consolidation. Small to moderate right and small left pleural effusions. Associated lower lobe compressive atelectasis. No pneumothorax. Musculoskeletal: Very mild degenerative changes of the mid thoracic spine. Healing/healed right anterolateral 4th through 7th rib fracture deformities. Review of the MIP images confirms the above findings. CT ABDOMEN and PELVIS FINDINGS Motion degraded images. Hepatobiliary: Liver is within normal limits. Layering gallbladder sludge (series 12/image 36), without associated inflammatory changes. No intrahepatic or extrahepatic ductal  dilatation. Pancreas: Within normal limits. Spleen: Within normal limits. Adrenals/Urinary Tract: Adrenal glands are within normal limits. Kidneys are within normal limits.  No hydronephrosis. Stomach/Bowel: Stomach is within normal limits. 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 There is frank diastasis/laxity of the anterior abdominal wall with herniation of numerous loops of small bowel, which remain nondilated. Persistent overlying cutaneous thickening suggests cellulitis (series 12/image 87). Trace fluid beneath the skin surface, but without drainable fluid collection/abscess. Vascular/Lymphatic: No evidence of abdominal aortic aneurysm. Atherosclerotic calcifications of the abdominal aorta and branch vessels. No suspicious abdominopelvic lymphadenopathy. Reproductive: Prostate is unremarkable. Other: Trace subcutaneous fluid, as noted above, without discrete abdominopelvic ascites. Musculoskeletal: Mild degenerative changes of the lumbar spine. Review of the MIP images confirms the above findings. IMPRESSION: No evidence of pulmonary embolism. Mild interstitial edema. Small to moderate bilateral pleural effusions, right greater than left. Associated lower lobe compressive atelectasis. 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 Homero FellersFrank diastasis/laxity of the anterior abdominal wall with herniation of numerous loops of nondilated small bowel. Overlying skin thickening with trace subcutaneous fluid suggests cellulitis, but without drainable fluid collection/abscess. Electronically Signed   By: Charline BillsSriyesh  Krishnan M.D.   On: 12/26/2020 00:36   ECHOCARDIOGRAM COMPLETE  Result Date: 12/26/2020    ECHOCARDIOGRAM REPORT   Patient Name:   Westley FootsJOHN Gaccione Date of Exam: 12/26/2020 Medical Rec #:  [id number]     Height:       73.0 in Accession #:    [id number]    Weight:       218.0 lb Date of Birth:  09/04/1960  BSA:          2.232 m Patient Age:    60  years      BP:           142/72 mmHg Patient Gender: M             HR:           42 bpm. Exam Location:  Inpatient Procedure: 2D Echo, Cardiac Doppler, Color Doppler and Intracardiac            Opacification Agent                     STAT ECHO Reported to: Dr Royann Shivers on 12/26/2020 10:05:00 AM. Indications:    Dyspnea R06.00  History:        Patient has no prior history of Echocardiogram examinations.                 Risk Factors:Hypertension, Diabetes, Dyslipidemia and Current                 Smoker. ETOH abuse. DVT.  Sonographer:    Tiffany Dance Referring Phys: [id number] HAO MENG IMPRESSIONS  1. Left ventricular ejection fraction, by estimation, is 65 to 70%. The left ventricle has normal function. The left ventricle has no regional wall motion abnormalities. There is moderate concentric left ventricular hypertrophy. Diastolic function cannot be evaluated due to AV dissociation, but annulus early diastolic velocities are normal.  2. Right ventricular systolic function is normal. The right ventricular size is normal. There is normal pulmonary artery systolic pressure.  3. Left atrial size was moderately dilated.  4. 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 The mitral valve is normal in structure. Mild mitral valve regurgitation.  5. The aortic valve is tricuspid. There is mild calcification of the aortic valve. Aortic valve regurgitation is not visualized. Mild to moderate aortic valve sclerosis/calcification is present, without any evidence of aortic stenosis.  6. Aortic dilatation noted. There is borderline dilatation of the aortic root, measuring 38 mm.  7. The inferior vena cava is dilated in size with >50% respiratory variability, suggesting right atrial pressure of 8 mmHg. Conclusion(s)/Recommendation(s): No evidence of valvular vegetations on this transthoracic echocardiogram. Would recommend a transesophageal echocardiogram to exclude infective endocarditis if clinically indicated.  The rhythm appears to be complete heart  block with junctional escape rhythm. FINDINGS  Left Ventricle: Left ventricular ejection fraction, by estimation, is 65 to 70%. The left ventricle has normal function. The left ventricle has no regional wall motion abnormalities. Definity contrast agent was given IV to delineate the left ventricular  endocardial borders. The left ventricular internal cavity size was normal in size. There is moderate concentric left ventricular hypertrophy. Diastolic function cannot be evaluated due to AV dissociation, but annulus early diastolic velocities are normal. Right Ventricle: The right ventricular size is normal. No increase in right ventricular wall thickness. Right ventricular systolic function is normal. There is normal pulmonary artery systolic pressure. The tricuspid regurgitant velocity is 2.46 m/s, and  with an assumed right atrial pressure of 8 mmHg, the estimated right ventricular systolic pressure is 32.2 mmHg. Left Atrium: Left atrial size was moderately dilated. Right Atrium: Right atrial size was normal in size. Pericardium: There is no evidence of pericardial effusion. 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 The mitral valve is normal in structure. Mild mitral annular calcification. Mild mitral valve regurgitation, with centrally-directed jet. Tricuspid Valve: The tricuspid  valve is normal in structure. Tricuspid valve regurgitation is mild. Aortic Valve: The aortic valve is tricuspid. There is mild calcification of the aortic valve. Aortic valve regurgitation is not visualized. Mild to moderate aortic valve sclerosis/calcification is present, without any evidence of aortic stenosis. Aortic valve mean gradient measures 22.0 mmHg. Aortic valve peak gradient measures 38.6 mmHg. Pulmonic Valve: The pulmonic valve was normal in structure. Pulmonic valve regurgitation is trivial. Aorta: Aortic dilatation noted. There is borderline  dilatation of the aortic root, measuring 38 mm. Venous: The inferior vena cava is dilated in size with greater than 50% respiratory variability, suggesting right atrial pressure of 8 mmHg. IAS/Shunts: No atrial level shunt detected by color flow Doppler.  LEFT VENTRICLE PLAX 2D LVIDd:         4.80 cm  Diastology LVIDs:         3.00 cm  LV e' medial:    11.70 cm/s LV PW:         1.50 cm  LV E/e' medial:  15.9 LV IVS:        1.60 cm  LV e' lateral:   13.80 cm/s LVOT diam:     2.50 cm  LV E/e' lateral: 13.5 LVOT Area:     4.91 cm  RIGHT VENTRICLE             IVC RV Basal diam:  3.30 cm     IVC diam: 2.30 cm RV Mid diam:    2.60 cm RV S prime:     14.70 cm/s TAPSE (M-mode): 2.3 cm LEFT ATRIUM              Index       RIGHT ATRIUM           Index LA diam:        4.90 cm  2.20 cm/m  RA Area:     18.10 cm LA Vol (A2C):   120.0 ml 53.76 ml/m RA Volume:   47.30 ml  21.19 ml/m LA Vol (A4C):   74.9 ml  33.56 ml/m LA Biplane Vol: 96.3 ml  43.14 ml/m  AORTIC VALVE AV Vmax:      310.50 cm/s AV Vmean:     214.000 cm/s AV VTI:       0.651 m AV Peak Grad: 38.6 mmHg AV Mean Grad: 22.0 mmHg  AORTA Ao Root diam: 3.80 cm Ao Asc diam:  3.60 cm MITRAL VALVE                TRICUSPID VALVE MV Area (PHT): 3.99 cm     TR Peak grad:   24.2 mmHg MV Decel Time: 190 msec     TR Vmax:        246.00 cm/s MV E velocity: 186.00 cm/s MV A velocity: 100.00 cm/s  SHUNTS MV E/A ratio:  1.86         Systemic Diam: 2.50 cm Mihai Croitoru MD Electronically signed by Thurmon Fair MD Signature Date/Time: 12/26/2020/10:56:20 AM    Final     Cardiac Studies   No new   Assessment & Plan  60yo man with perforated diverticulitis in the past with surgical intervention c/b fascial dehiscence and 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   Complete heart block Unclear etiology. Endocarditis still on the differential. Also ? Infiltrative process w/ sarcoid. He needs a TEE and cMR to investigate further. He is not a candidate at this point  for a permanent transvenous pacemaker--his abomdinal infection needs to  be addressed first. - if escape rhythm becomes unstable, he will require activation of the cath lab for a temporary transvenous pacemaker wire  2. Abdominal Wound - Appreciate ID, gen surg assistance - Bcx x 2 pending    For questions or updates, please contact [facility] Please consult [email or web address] for contact info under        Signed, [name], MD  12/27/2020, 7:44 AM

## 2020-12-28 ENCOUNTER — Inpatient Hospital Stay (HOSPITAL_COMMUNITY): Payer: 59

## 2020-12-28 DIAGNOSIS — I442 Atrioventricular block, complete: Secondary | ICD-10-CM

## 2020-12-28 LAB — GLUCOSE, CAPILLARY
Glucose-Capillary: 98 mg/dL (ref 70–99)
Glucose-Capillary: 102 mg/dL — ABNORMAL HIGH (ref 70–99)
Glucose-Capillary: 113 mg/dL — ABNORMAL HIGH (ref 70–99)
Glucose-Capillary: 121 mg/dL — ABNORMAL HIGH (ref 70–99)

## 2020-12-28 LAB — CK: Total CK: 48 U/L — ABNORMAL LOW (ref 49–397)

## 2020-12-28 LAB — HEPATITIS B SURFACE ANTIBODY, QUANTITATIVE: Hep B S AB Quant (Post): 385.5 m[IU]/mL (ref >9.9)

## 2020-12-28 LAB — LYME DISEASE SEROLOGY W/REFLEX: Lyme Total Antibody EIA: NEGATIVE

## 2020-12-28 MED ORDER — GADOBUTROL 1 MMOL/ML IV SOLN
10.0000 mL | Freq: Once | INTRAVENOUS | Status: AC | PRN
  Administered 2020-12-28: 10 mL via INTRAVENOUS

## 2020-12-28 NOTE — Progress Notes (Signed)
CSW received consult for substance use resources for patient. CSW met with patient at bedside. CSW offered patient outpatient substance use treatment services resources. Patient accepted. Patient reported he may need transportation when medically ready for dc. CSW will continue to follow and assist with dc planning needs.

## 2020-12-28 NOTE — Consult Note (Addendum)
WOC Nurse Consult Note: Patient receiving care in [facility] [address] This patient has been seen by [facility], Micheal Maczis, PA-C and is being followed by plastics as well. Scheduled for surgical procedure at [facility] but has not followed up with them.  Reason for Consult: Abdominal wound Dressing procedure/placement/frequency: Patient is currently using Xeroform gauze with ABD at home. We will continue this treatment.  Clean the abdomin with NS pat dry then apply Xeroform gauze over the area and secured with ABD pads and Medipore tape.   Monitor the wound area(s) for worsening of condition such as: Signs/symptoms of infection, increase in size, development of or worsening of odor, development of pain, or increased pain at the affected locations.   Notify the medical team if any of these develop.  WOC Nurse ostomy consult note Patient is independent with pouch changes. Stoma type/location: RLQ ileostomy Stomal assessment/size: not measured Peristomal assessment: pouch not changed Output: 150 cc of thin green/brown stool Ostomy pouching: 1pc. [place] # [id number]) Barrier ring [facility] [place] # [phone]) Education provided: None Enrolled patient in [facility] DC program: No   Thank you for the consult. WOC nurse will not follow at this time.   Please re-consult the WOC team if needed.  Renaldo Reel Katrinka Blazing, MSN, RN, CMSRN, Angus Seller, [facility] Wound Treatment Associate Pager [phone]

## 2020-12-28 NOTE — Progress Notes (Signed)
Electrophysiology Rounding Note  Patient Name: Luis [name] Date of Encounter: 12/28/2020  Primary Cardiologist: None Electrophysiologist: Dr. Lalla Brothers following thus far   Subjective   NAEO. "Slept well last night" "Wounds are hurting. They don't usually"  Inpatient Medications    Scheduled Meds:  aspirin EC  81 mg Oral Daily   enoxaparin (LOVENOX) injection  40 mg Subcutaneous Q24H   folic acid  1 mg Oral Daily   insulin aspart  0-15 Units Subcutaneous TID WC   insulin aspart  0-5 Units Subcutaneous QHS   mouth rinse  15 mL Mouth Rinse BID   multivitamin with minerals  1 tablet Oral Daily   nicotine  21 mg Transdermal Daily   thiamine  100 mg Oral Daily   Or   thiamine  100 mg Intravenous Daily   Continuous Infusions:  DAPTOmycin (CUBICIN)  IV Stopped (12/27/20 2119)   DOPamine     piperacillin-tazobactam (ZOSYN)  IV 3.375 g (12/28/20 0459)   PRN Meds: acetaminophen, LORazepam **OR** LORazepam, nitroGLYCERIN, ondansetron (ZOFRAN) IV, traMADol   Vital Signs    Vitals:   12/27/20 1617 12/27/20 1940 12/27/20 2330 12/28/20 0502  BP: (!) 164/85 (!) 162/74 (!) 148/71 (!) 143/72  Pulse: 77 (!) 48 (!) 44   Resp: 20 18 16 15   Temp: 98.6 F (37 C) 99.2 F (37.3 C) 99.1 F (37.3 C) 98.9 F (37.2 C)  TempSrc: Oral Oral Oral Oral  SpO2: 100% 97% 95% 95%  Weight:      Height:        Intake/Output Summary (Last 24 hours) at 12/28/2020 0710 Last data filed at 12/28/2020 0500 Gross per 24 hour  Intake 666 ml  Output 1000 ml  Net -334 ml   Filed Weights   12/25/20 2017 12/26/20 0419  Weight: 95 kg 98.9 kg    Physical Exam    GEN- The patient is well appearing, alert and oriented x 3 today.   Head- normocephalic, atraumatic Eyes-  Sclera clear, conjunctiva pink Ears- hearing intact Oropharynx- clear Neck- supple Lungs- Clear to ausculation bilaterally, normal work of breathing Heart-  Slow, regular  rate and rhythm, no murmurs, rubs or gallops GI- soft, NT,  ND, + BS Extremities- no clubbing or cyanosis. No edema Skin- no rash. Abdominal wounds as previously noted.  Psych- euthymic mood, full affect Neuro- strength and sensation are intact  Labs    CBC Recent Labs    12/25/20 2024 12/26/20 0144  WBC 10.1 10.0  NEUTROABS 7.3  --   HGB 12.7* 12.7*  HCT 38.6* 37.7*  MCV 106.3* 103.6*  PLT 320 296   Basic Metabolic Panel Recent Labs    12/28/20 2024 12/26/20 0144  NA 132* 132*  K 5.5* 5.1  CL 103 103  CO2 17* 15*  GLUCOSE 99 87  BUN 26* 28*  CREATININE 1.32* 1.26*  CALCIUM 8.9 8.7*  MG  --  2.1  PHOS  --  5.0*   Liver Function Tests Recent Labs    12/25/20 2024  AST 33  ALT 29  ALKPHOS 124  BILITOT 0.9  PROT 7.4  ALBUMIN 3.4*   Recent Labs    12/25/20 2024  LIPASE 46   Cardiac Enzymes Recent Labs    12/26/20 1218  CKTOTAL 112     Telemetry    Remains in CHB with narrow escape in the 40s (Areas read as 60s are double counting of T waves / telemetry artifact) (personally reviewed)  Radiology    ECHOCARDIOGRAM  COMPLETE  Result Date: 12/26/2020    ECHOCARDIOGRAM REPORT   Patient Name:   Luis [name] Date of Exam: 12/26/2020 Medical Rec #:  [id number]     Height:       73.0 in Accession #:    [id number]    Weight:       218.0 lb Date of Birth:  1960/11/27     BSA:          2.232 m Patient Age:    60 years      BP:           142/72 mmHg Patient Gender: M             HR:           42 bpm. Exam Location:  Inpatient Procedure: 2D Echo, Cardiac Doppler, Color Doppler and Intracardiac            Opacification Agent                     STAT ECHO Reported to: Dr Royann Shiversroitoru on 12/26/2020 10:05:00 AM. Indications:    Dyspnea R06.00  History:        Patient has no prior history of Echocardiogram examinations.                 Risk Factors:Hypertension, Diabetes, Dyslipidemia and Current                 Smoker. ETOH abuse. DVT.  Sonographer:    Tiffany Dance Referring Phys: [id number] HAO MENG IMPRESSIONS  1. Left ventricular  ejection fraction, by estimation, is 65 to 70%. The left ventricle has normal function. The left ventricle has no regional wall motion abnormalities. There is moderate concentric left ventricular hypertrophy. Diastolic function cannot be evaluated due to AV dissociation, but annulus early diastolic velocities are normal.  2. Right ventricular systolic function is normal. The right ventricular size is normal. There is normal pulmonary artery systolic pressure.  3. Left atrial size was moderately dilated.  4. 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 The mitral valve is normal in structure. Mild mitral valve regurgitation.  5. The aortic valve is tricuspid. There is mild calcification of the aortic valve. Aortic valve regurgitation is not visualized. Mild to moderate aortic valve sclerosis/calcification is present, without any evidence of aortic stenosis.  6. Aortic dilatation noted. There is borderline dilatation of the aortic root, measuring 38 mm.  7. The inferior vena cava is dilated in size with >50% respiratory variability, suggesting right atrial pressure of 8 mmHg. Conclusion(s)/Recommendation(s): No evidence of valvular vegetations on this transthoracic echocardiogram. Would recommend a transesophageal echocardiogram to exclude infective endocarditis if clinically indicated. The rhythm appears to be complete heart  block with junctional escape rhythm. FINDINGS  Left Ventricle: Left ventricular ejection fraction, by estimation, is 65 to 70%. The left ventricle has normal function. The left ventricle has no regional wall motion abnormalities. Definity contrast agent was given IV to delineate the left ventricular  endocardial borders. The left ventricular internal cavity size was normal in size. There is moderate concentric left ventricular hypertrophy. Diastolic function cannot be evaluated due to AV dissociation, but annulus early diastolic velocities are normal. Right Ventricle: The  right ventricular size is normal. No increase in right ventricular wall thickness. Right ventricular systolic function is normal. There is normal pulmonary artery systolic pressure. The tricuspid regurgitant velocity is 2.46 m/s, and  with an assumed right atrial pressure of 8 mmHg, the  estimated right ventricular systolic pressure is 32.2 mmHg. Left Atrium: Left atrial size was moderately dilated. Right Atrium: Right atrial size was normal in size. Pericardium: There is no evidence of pericardial effusion. 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 The mitral valve is normal in structure. Mild mitral annular calcification. Mild mitral valve regurgitation, with centrally-directed jet. Tricuspid Valve: The tricuspid valve is normal in structure. Tricuspid valve regurgitation is mild. Aortic Valve: The aortic valve is tricuspid. There is mild calcification of the aortic valve. Aortic valve regurgitation is not visualized. Mild to moderate aortic valve sclerosis/calcification is present, without any evidence of aortic stenosis. Aortic valve mean gradient measures 22.0 mmHg. Aortic valve peak gradient measures 38.6 mmHg. Pulmonic Valve: The pulmonic valve was normal in structure. Pulmonic valve regurgitation is trivial. Aorta: Aortic dilatation noted. There is borderline dilatation of the aortic root, measuring 38 mm. Venous: The inferior vena cava is dilated in size with greater than 50% respiratory variability, suggesting right atrial pressure of 8 mmHg. IAS/Shunts: No atrial level shunt detected by color flow Doppler.  LEFT VENTRICLE PLAX 2D LVIDd:         4.80 cm  Diastology LVIDs:         3.00 cm  LV e' medial:    11.70 cm/s LV PW:         1.50 cm  LV E/e' medial:  15.9 LV IVS:        1.60 cm  LV e' lateral:   13.80 cm/s LVOT diam:     2.50 cm  LV E/e' lateral: 13.5 LVOT Area:     4.91 cm  RIGHT VENTRICLE             IVC RV Basal diam:  3.30 cm     IVC diam: 2.30 cm RV Mid  diam:    2.60 cm RV S prime:     14.70 cm/s TAPSE (M-mode): 2.3 cm LEFT ATRIUM              Index       RIGHT ATRIUM           Index LA diam:        4.90 cm  2.20 cm/m  RA Area:     18.10 cm LA Vol (A2C):   120.0 ml 53.76 ml/m RA Volume:   47.30 ml  21.19 ml/m LA Vol (A4C):   74.9 ml  33.56 ml/m LA Biplane Vol: 96.3 ml  43.14 ml/m  AORTIC VALVE AV Vmax:      310.50 cm/s AV Vmean:     214.000 cm/s AV VTI:       0.651 m AV Peak Grad: 38.6 mmHg AV Mean Grad: 22.0 mmHg  AORTA Ao Root diam: 3.80 cm Ao Asc diam:  3.60 cm MITRAL VALVE                TRICUSPID VALVE MV Area (PHT): 3.99 cm     TR Peak grad:   24.2 mmHg MV Decel Time: 190 msec     TR Vmax:        246.00 cm/s MV E velocity: 186.00 cm/s MV A velocity: 100.00 cm/s  SHUNTS MV E/A ratio:  1.86         Systemic Diam: 2.50 cm Rachelle Hora Croitoru MD Electronically signed by Thurmon Fair MD Signature Date/Time: 12/26/2020/10:56:20 AM    Final     Patient Profile  [phone] man with perforated diverticulitis in the past with surgical  intervention c/b fascial dehiscence and 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   Assessment & Plan    Complete heart block Unclear etiology. Endocarditis still on the differential. Also ? Infiltrative process w/ sarcoid. cMRI ordered.  May be able to defer TEE if cMRI unremarkable and BCx remain negative.  He is not a candidate at this point for a permanent transvenous pacemaker--his abomdinal infection makes his infection risk prohibitively high.  - if escape rhythm becomes unstable, he will require activation of the cath lab for a temporary transvenous pacemaker wire   2. Abdominal Wound - Appreciate ID, gen surg assistance - Bcx x 2 NG x 48 hours.    For questions or updates, please contact [facility] Please consult [email or web address] for contact info under Cardiology/STEMI.  Signed, Graciella Freer, PA-C  12/28/2020, 7:10 AM

## 2020-12-29 ENCOUNTER — Inpatient Hospital Stay (HOSPITAL_COMMUNITY): Payer: 59

## 2020-12-29 DIAGNOSIS — I442 Atrioventricular block, complete: Secondary | ICD-10-CM | POA: Diagnosis not present

## 2020-12-29 DIAGNOSIS — S31109D Unspecified open wound of abdominal wall, unspecified quadrant without penetration into peritoneal cavity, subsequent encounter: Secondary | ICD-10-CM | POA: Diagnosis not present

## 2020-12-29 DIAGNOSIS — F10230 Alcohol dependence with withdrawal, uncomplicated: Secondary | ICD-10-CM | POA: Diagnosis not present

## 2020-12-29 DIAGNOSIS — K432 Incisional hernia without obstruction or gangrene: Secondary | ICD-10-CM

## 2020-12-29 DIAGNOSIS — E119 Type 2 diabetes mellitus without complications: Secondary | ICD-10-CM | POA: Diagnosis not present

## 2020-12-29 DIAGNOSIS — I10 Essential (primary) hypertension: Secondary | ICD-10-CM

## 2020-12-29 DIAGNOSIS — T8130XD Disruption of wound, unspecified, subsequent encounter: Secondary | ICD-10-CM

## 2020-12-29 LAB — HCV AB W REFLEX TO QUANT PCR: HCV Ab: <0.1 s/co ratio (ref 0.0–0.9)

## 2020-12-29 LAB — CBC
HCT: 33.6 % — ABNORMAL LOW (ref 39.0–52.0)
Hemoglobin: 11.4 g/dL — ABNORMAL LOW (ref 13.0–17.0)
MCH: 35.5 pg — ABNORMAL HIGH (ref 26.0–34.0)
MCHC: 33.9 g/dL (ref 30.0–36.0)
MCV: 104.7 fL — ABNORMAL HIGH (ref 80.0–100.0)
Platelets: 185 10*3/uL (ref 150–400)
RBC: 3.21 MIL/uL — ABNORMAL LOW (ref 4.22–5.81)
RDW: 12.9 % (ref 11.5–15.5)
WBC: 7.5 10*3/uL (ref 4.0–10.5)
nRBC: 0 % (ref 0.0–0.2)

## 2020-12-29 LAB — BASIC METABOLIC PANEL
Anion gap: 8 (ref 5–15)
BUN: 14 mg/dL (ref 8–23)
CO2: 22 mmol/L (ref 22–32)
Calcium: 8.9 mg/dL (ref 8.9–10.3)
Chloride: 107 mmol/L (ref 98–111)
Creatinine, Ser: 0.82 mg/dL (ref 0.61–1.24)
GFR, Estimated: >60 mL/min (ref >60)
Glucose, Bld: 98 mg/dL (ref 70–99)
Potassium: 3.8 mmol/L (ref 3.5–5.1)
Sodium: 137 mmol/L (ref 135–145)

## 2020-12-29 LAB — SURGICAL PCR SCREEN
MRSA, PCR: POSITIVE — AB
Staphylococcus aureus: POSITIVE — AB

## 2020-12-29 LAB — GLUCOSE, CAPILLARY
Glucose-Capillary: 106 mg/dL — ABNORMAL HIGH (ref 70–99)
Glucose-Capillary: 111 mg/dL — ABNORMAL HIGH (ref 70–99)
Glucose-Capillary: 117 mg/dL — ABNORMAL HIGH (ref 70–99)
Glucose-Capillary: 115 mg/dL — ABNORMAL HIGH (ref 70–99)

## 2020-12-29 LAB — HCV INTERPRETATION

## 2020-12-29 MED ORDER — MUPIROCIN 2 % EX OINT
1.0000 "application " | TOPICAL_OINTMENT | Freq: Two times a day (BID) | CUTANEOUS | Status: DC
  Administered 2020-12-29 – 2021-01-01 (×6): 1 via NASAL
  Filled 2020-12-29 (×3): qty 22

## 2020-12-29 MED ORDER — LORAZEPAM 0.5 MG PO TABS
0.5000 mg | ORAL_TABLET | Freq: Every day | ORAL | Status: AC
  Administered 2020-12-29: 0.5 mg via ORAL
  Filled 2020-12-29: qty 1

## 2020-12-29 MED ORDER — CHLORHEXIDINE GLUCONATE CLOTH 2 % EX PADS
6.0000 | MEDICATED_PAD | Freq: Every day | CUTANEOUS | Status: DC
  Administered 2020-12-30 – 2021-01-01 (×3): 6 via TOPICAL

## 2020-12-29 NOTE — Evaluation (Signed)
Physical Therapy Evaluation Patient Details Name: Luis [name] MRN: [id number] DOB: 1961-03-12 Today's Date: 12/29/2020   History of Present Illness  Pt adm 7/15 with high grade AV block. Pt with extensive history of abdominal surgeries after diverticulitis with perforation, colectomy, ileostomy and open wound, fistula, and infections. Pt not candidate for permanent pacer at this time due to abdominal infection. MRI suggest infiltrative process such as sarcoidosis or myocarditis as the cause of heart block. PMH - DM, HTN, DVT,  Clinical Impression  Pt doing well with mobility and no further PT needed.  RHR 40's and HR 50's with amb    Follow Up Recommendations No PT follow up    Equipment Recommendations  None recommended by PT    Recommendations for Other Services       Precautions / Restrictions Precautions Precautions: None      Mobility  Bed Mobility Overal bed mobility: Modified Independent                  Transfers Overall transfer level: Independent Equipment used: None                Ambulation/Gait Ambulation/Gait assistance: Modified independent (Device/Increase time) Gait Distance (Feet): 200 Feet Assistive device: None Gait Pattern/deviations: Step-through pattern;Decreased stride length Gait velocity: adequate   General Gait Details: Steady gait  Stairs            Wheelchair Mobility    Modified Rankin (Stroke Patients Only)       Balance Overall balance assessment: Mild deficits observed, not formally tested                                           Pertinent Vitals/Pain Pain Assessment: No/denies pain    Home Living Family/patient expects to be discharged to:: Private residence Living Arrangements: Alone   Type of Home: House       Home Layout: Two level;Able to live on main level with bedroom/bathroom Home Equipment: None      Prior Function Level of Independence: Independent          Comments: reports history of medication related falls     Hand Dominance        Extremity/Trunk Assessment   Upper Extremity Assessment Upper Extremity Assessment: Overall WFL for tasks assessed    Lower Extremity Assessment Lower Extremity Assessment: Overall WFL for tasks assessed       Communication   Communication: No difficulties  Cognition Arousal/Alertness: Awake/alert Behavior During Therapy: WFL for tasks assessed/performed Overall Cognitive Status: Within Functional Limits for tasks assessed                                        General Comments General comments (skin integrity, edema, etc.): HR 40's at rest and 50's with amb    Exercises     Assessment/Plan    PT Assessment Patent does not need any further PT services  PT Problem List         PT Treatment Interventions      PT Goals (Current goals can be found in the Care Plan section)  Acute Rehab PT Goals PT Goal Formulation: All assessment and education complete, DC therapy    Frequency     Barriers to discharge  Co-evaluation               AM-PAC PT "6 Clicks" Mobility  Outcome Measure Help needed turning from your back to your side while in a flat bed without using bedrails?: None Help needed moving from lying on your back to sitting on the side of a flat bed without using bedrails?: None Help needed moving to and from a bed to a chair (including a wheelchair)?: None Help needed standing up from a chair using your arms (e.g., wheelchair or bedside chair)?: None Help needed to walk in hospital room?: None Help needed climbing 3-5 steps with a railing? : None 6 Click Score: 24    End of Session   Activity Tolerance: Patient tolerated treatment well Patient left: in bed;with call bell/phone within reach   PT Visit Diagnosis: Other abnormalities of gait and mobility (R26.89)    Time: 2355-7322 PT Time Calculation (min) (ACUTE ONLY): 13  min   Charges:   PT Evaluation $PT Eval Low Complexity: 1 Low          [facility] PT Acute Rehabilitation Services Pager [phone] Office [phone]   Angelina Ok [facility] 12/29/2020, 4:31 PM

## 2020-12-29 NOTE — Progress Notes (Signed)
Subjective: No new complaints   Antibiotics:  Anti-infectives (From admission, onward)    Start     Dose/Rate Route Frequency Ordered Stop   12/26/20 1418  DAPTOmycin (CUBICIN) 800 mg in sodium chloride 0.9 % IVPB  Status:  Discontinued        800 mg 132 mL/hr over 30 Minutes Intravenous Daily 12/26/20 1419 12/29/20 1547   12/26/20 1300  piperacillin-tazobactam (ZOSYN) IVPB 3.375 g  Status:  Discontinued        3.375 g 12.5 mL/hr over 240 Minutes Intravenous Every 8 hours 12/26/20 1203 12/29/20 1547   12/26/20 1300  DAPTOmycin (CUBICIN) 500 mg in sodium chloride 0.9 % IVPB  Status:  Discontinued        500 mg 120 mL/hr over 30 Minutes Intravenous Daily 12/26/20 1203 12/26/20 1419   12/26/20 1000  vancomycin (VANCOCIN) IVPB 1000 mg/200 mL premix  Status:  Discontinued        1,000 mg 200 mL/hr over 60 Minutes Intravenous Every 12 hours 12/26/20 0050 12/26/20 1122   12/26/20 0100  vancomycin (VANCOREADY) IVPB 1500 mg/300 mL        1,500 mg 150 mL/hr over 120 Minutes Intravenous  Once 12/26/20 0048 12/26/20 0409       Medications: Scheduled Meds:  aspirin EC  81 mg Oral Daily   enoxaparin (LOVENOX) injection  40 mg Subcutaneous Q24H   folic acid  1 mg Oral Daily   insulin aspart  0-15 Units Subcutaneous TID WC   insulin aspart  0-5 Units Subcutaneous QHS   mouth rinse  15 mL Mouth Rinse BID   multivitamin with minerals  1 tablet Oral Daily   nicotine  21 mg Transdermal Daily   thiamine  100 mg Oral Daily   Or   thiamine  100 mg Intravenous Daily   Continuous Infusions:   PRN Meds:.acetaminophen, nitroGLYCERIN, ondansetron (ZOFRAN) IV, traMADol    Objective: Weight change:   Intake/Output Summary (Last 24 hours) at 12/29/2020 1547 Last data filed at 12/29/2020 1610 Gross per 24 hour  Intake 657.45 ml  Output --  Net 657.45 ml    Blood pressure 138/78, pulse (!) 44, temperature 98.4 F (36.9 C), temperature source Axillary, resp. rate 16, height 6'  1" (1.854 m), weight 91.1 kg, SpO2 99 %. Temp:  [98 F (36.7 C)-98.8 F (37.1 C)] 98.4 F (36.9 C) (07/19 1400) Pulse Rate:  [40-60] 44 (07/19 1400) Resp:  [16-18] 16 (07/19 1400) BP: (136-174)/(74-93) 138/78 (07/19 1400) SpO2:  [93 %-99 %] 99 % (07/19 1400) Weight:  [91.1 kg] 91.1 kg (07/19 0340)  Physical Exam: Physical Exam Constitutional:      Appearance: He is well-developed.  HENT:     Head: Normocephalic and atraumatic.     Nose: Nose normal.  Eyes:     Extraocular Movements: Extraocular movements intact.     Conjunctiva/sclera: Conjunctivae normal.  Cardiovascular:     Rate and Rhythm: Regular rhythm. Bradycardia present.     Heart sounds: No murmur heard.   No friction rub. No gallop.  Pulmonary:     Effort: Pulmonary effort is normal. No respiratory distress.     Breath sounds: No wheezing.  Abdominal:     General: There is distension.     Palpations: Abdomen is soft.     Hernia: A hernia is present.  Musculoskeletal:        General: Normal range of motion.     Cervical back: Normal range of  motion and neck supple.  Skin:    General: Skin is dry.     Findings: No erythema or rash.  Neurological:     General: No focal deficit present.     Mental Status: He is alert and oriented to person, place, and time.  Psychiatric:        Attention and Perception: Attention and perception normal.        Mood and Affect: Mood is anxious.        Behavior: Behavior normal.        Thought Content: Thought content normal.        Judgment: Judgment normal.     Wound not examined today   CBC:    BMET Recent Labs    12/29/20 0148  NA 137  K 3.8  CL 107  CO2 22  GLUCOSE 98  BUN 14  CREATININE 0.82  CALCIUM 8.9      Liver Panel  No results for input(s): PROT, ALBUMIN, AST, ALT, ALKPHOS, BILITOT, BILIDIR, IBILI in the last 72 hours.      Sedimentation Rate Recent Labs    12/27/20 0127  ESRSEDRATE 62*    C-Reactive Protein Recent Labs     12/27/20 0127  CRP 7.0*     Micro Results: Recent Results (from the past 720 hour(s))  Resp Panel by RT-PCR (Flu A&B, Covid) Nasopharyngeal Swab     Status: None   Collection Time: 12/25/20  9:35 PM   Specimen: Nasopharyngeal Swab; Nasopharyngeal(NP) swabs in vial transport medium  Result Value Ref Range Status   SARS Coronavirus 2 by RT PCR NEGATIVE NEGATIVE Final    Comment: (NOTE) SARS-CoV-2 target nucleic acids are NOT DETECTED.  The SARS-CoV-2 RNA is generally detectable in upper respiratory specimens during the acute phase of infection. The lowest concentration of SARS-CoV-2 viral copies this assay can detect is 138 copies/mL. A negative result does not preclude SARS-Cov-2 infection and should not be used as the sole basis for treatment or other patient management decisions. A negative result may occur with  improper specimen collection/handling, submission of specimen other than nasopharyngeal swab, presence of viral mutation(s) within the areas targeted by this assay, and inadequate number of viral copies(<138 copies/mL). A negative result must be combined with clinical observations, patient history, and epidemiological information. The expected result is Negative.  Fact Sheet for Patients:  BloggerCourse.com  Fact Sheet for Healthcare Providers:  SeriousBroker.it  This test is no t yet approved or cleared by the [place] FDA and  has been authorized for detection and/or diagnosis of SARS-CoV-2 by FDA under an Emergency Use Authorization (EUA). This EUA will remain  in effect (meaning this test can be used) for the duration of the COVID-19 declaration under Section 564(b)(1) of the Act, 21 U.S.C.section 360bbb-3(b)(1), unless the authorization is terminated  or revoked sooner.       Influenza A by PCR NEGATIVE NEGATIVE Final   Influenza B by PCR NEGATIVE NEGATIVE Final    Comment: (NOTE) The Xpert Xpress  SARS-CoV-2/FLU/RSV plus assay is intended as an aid in the diagnosis of influenza from Nasopharyngeal swab specimens and should not be used as a sole basis for treatment. Nasal washings and aspirates are unacceptable for Xpert Xpress SARS-CoV-2/FLU/RSV testing.  Fact Sheet for Patients: BloggerCourse.com  Fact Sheet for Healthcare Providers: SeriousBroker.it  This test is not yet approved or cleared by the [place] FDA and has been authorized for detection and/or diagnosis of SARS-CoV-2 by FDA under an Emergency Use  Authorization (EUA). This EUA will remain in effect (meaning this test can be used) for the duration of the COVID-19 declaration under Section 564(b)(1) of the Act, 21 U.S.C. section 360bbb-3(b)(1), unless the authorization is terminated or revoked.  Performed at [facility], [address] [place], [place] [address]   Culture, blood (routine x 2)     Status: None (Preliminary result)   Collection Time: 12/26/20 12:43 AM   Specimen: BLOOD LEFT HAND  Result Value Ref Range Status   Specimen Description BLOOD LEFT HAND  Final   Special Requests   Final    BOTTLES DRAWN AEROBIC AND ANAEROBIC Blood Culture adequate volume   Culture   Final    NO GROWTH 3 DAYS Performed at [facility], [address], [place], [place] [address]    Report Status PENDING  Incomplete  Culture, blood (routine x 2)     Status: None (Preliminary result)   Collection Time: 12/26/20  1:44 AM   Specimen: BLOOD LEFT HAND  Result Value Ref Range Status   Specimen Description BLOOD LEFT HAND  Final   Special Requests   Final    BOTTLES DRAWN AEROBIC AND ANAEROBIC Blood Culture adequate volume   Culture   Final    NO GROWTH 3 DAYS Performed at [facility], [address] [place], [place] [address]    Report Status PENDING  Incomplete    Studies/Results: CT Chest High Resolution  Result Date: 12/29/2020 CLINICAL  DATA:  Inpatient. Dyspnea. 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 EXAM: CT CHEST WITHOUT CONTRAST TECHNIQUE: Multidetector CT imaging of the chest was performed following the standard protocol without intravenous contrast. High resolution imaging of the lungs, as well as inspiratory and expiratory imaging, was performed. COMPARISON:  12/25/2020 chest CT angiogram. FINDINGS: Cardiovascular: Normal heart size. No significant pericardial effusion/thickening. Three-vessel coronary atherosclerosis Atherosclerotic nonaneurysmal thoracic aorta. Top-normal caliber main pulmonary artery (3.1 cm diameter). Mediastinum/Nodes: No discrete thyroid nodules. Unremarkable esophagus. No pathologically enlarged axillary, mediastinal or hilar lymph nodes, noting limited sensitivity for the detection of hilar adenopathy on this noncontrast study. Lungs/Pleura: No pneumothorax. Small dependent bilateral pleural effusions, slightly increased bilaterally. Mild to moderate passive atelectasis in the dependent lower lobes bilaterally. Mild centrilobular emphysema with mild diffuse bronchial wall thickening. No lung masses or significant pulmonary nodules. No central airway stenoses. Mild-to-moderate patchy ground-glass opacities in peribronchovascular posterior left upper lobe and central left lower lobe, increased since 12/25/2020 chest CT. Mild diffuse interlobular septal thickening. No significant regions of subpleural reticulation, traction bronchiectasis or frank honeycombing. No significant lobular air trapping or evidence of tracheobronchomalacia on the expiration sequence. Upper abdomen: No acute abnormality. Musculoskeletal: No aggressive appearing focal osseous lesions. Mild thoracic spondylosis. IMPRESSION: 1. Mild-to-moderate patchy ground-glass opacities in the peribronchovascular posterior left upper and central left lower lobes, increased since 12/25/2020 chest CT. Favor mild pulmonary edema  given the generalized mild interlobular septal thickening in both lungs, although developing infection is not excluded. 2. Small dependent bilateral pleural effusions, slightly increased bilaterally. 3. Mild centrilobular emphysema with mild diffuse bronchial wall thickening, suggesting COPD. 4. No evidence of interstitial lung disease. No thoracic adenopathy. 5. Three-vessel coronary atherosclerosis. 6. Aortic Atherosclerosis (ICD10-I70.0) and Emphysema (ICD10-J43.9). Electronically Signed   By: Delbert Phenix M.D.   On: 12/29/2020 11:46   MR CARDIAC MORPHOLOGY W WO CONTRAST  Result Date: 12/28/2020 CLINICAL DATA:  Complete heart block EXAM: CARDIAC MRI TECHNIQUE: The patient was scanned on a  1.5 Tesla GE magnet. A dedicated cardiac coil was used. Functional imaging was done using Fiesta sequences. 2,3, and 4 chamber views were done to assess for RWMA's. Modified Simpson's rule using a short axis stack was used to calculate an ejection fraction on a dedicated work [employer]. The patient received 8 cc of Gadavist. After 10 minutes inversion recovery sequences were used to assess for infiltration and scar tissue. FINDINGS: Bilateral moderate pleural effusions were present. Airspace disease left > right base. Trivial pericardial effusion. Normal left ventricular size with mild LV hypertrophy. Normal wall motion with LV EF 72%. Normal right ventricular size and systolic function, EF 55%. Mild left and right atrial enlargement. Mitral regurgitation is likely mild. The aortic valve was calcified and trileaflet. No aortic regurgitation and probably no significant stenosis. On delayed enhancement images, there was mid-wall basal anteroseptal late gadolinium enhancement (LGE) and mid-wall basal inferolateral LGE, relatively small area appears to be involved. Measurements: LVEDV 227 mL LVSV 163 mL LVEF 72% RVEDV 238 mL RVSV 130 mL RVEF 55% ECV 27% in septum IMPRESSION: 1.  Normal LV size with mild LV  hypertrophy, EF 72%. 2.  Normal RV size with EF 55%. 3.  Normal ECV percentage is not suggestive of cardiac amyloidosis. 4. LGE noted in the mid-wall basal anteroseptum and mid-wall basal inferolateral wall. This is not a coronary disease pattern, could be consistent with cardiac sarcoidosis in the appropriate clinical situation, also prior myocarditis. Would suggest high resolution CT chest to look for any evidence for pulmonary sarcoidosis. Dalton Mclean Electronically Signed   By: Marca Ancona M.D.   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 male with history of morbid obesity diabetes mellitus hypertension diverticulitis with perforation status post colectomy and end ileostomy who developed abdominal wall hernia and has been followed by Dr. [name] Dr. Arita Miss and Dr. Mikey Bussing.  He was admitted now for work-up for shortness of breath and was found to be bradycardic with high-grade AV block.  Of the chest and abdomen pelvis were done and were unrevealing.  Blood cultures have been sterile.  He has been afebrile and without evidence of systemic infection  His wound does not appear infected infected to me or to general surgery who have been following him more closely.      #1  Acute heart block: MRI suggests infiltrative process such as sarcoidosis or possibly myocarditis as the cause.   TEE no longer need to be pursued  He does not have an infectious cause of heart block  Blood cultures reviewed again and without growth  CBC reassuring.  He is afebrile  He is likely to have leadless pacemaker  Trial of steroids  for Sarcoid might be informative and reverse his heart block possibly  I agree that he will be at high risk of infection at his large hernniated abdominal wound  #2 Abdominal wound with herniation: CT scan again reviewed repairing this site require multidisciplinary care at a tertiary care center and he is likely not to be transferred to [facility] or [facility] for further management of this.  Site  does not peer infected.  Now that an infectious etiology for his heart block has been excluded we will discontinue his systemic antibiotics, namely the daptomycin and Zosyn.  I will sign off for now please call further questions.     LOS: 3 days   Acey LavCornelius Van Dam 12/29/2020, 3:47 PM

## 2020-12-29 NOTE — Progress Notes (Signed)
Pharmacy Antibiotic Note  Luis [name] is a 60 y.o. male admitted on 12/25/2020 with abdominal cellulitis/wound w/ fistula.  Pharmacy has been consulted for daptomycin and piperacillin/tazobactam  dosing.  Concern for endocarditis given heart block, cultures/ID w/u negative so far. Good renal function, ClCr ~134ml /min. CK stable.   Plan: Daptomycin 8mg /kg = 800mg  q24 hr Pip/tazo 3.375g Q8h EI Monitor cultures, clinical status, renal fx, weekly CK Narrow abx as able and f/u duration per ID    Height: 6\' 1"  (185.4 cm) Weight: 91.1 kg (200 lb 12.8 oz) IBW/kg (Calculated) : 79.9  Temp (24hrs), Avg:98.4 F (36.9 C), Min:98 F (36.7 C), Max:98.8 F (37.1 C)  Recent Labs  Lab 12/25/20 2024 12/26/20 0043 12/26/20 0144 12/26/20 0649 12/26/20 1458 12/26/20 1856 12/29/20 0148  WBC 10.1  --  10.0  --   --   --  7.5  CREATININE 1.32*  --  1.26*  --   --   --  0.82  LATICACIDVEN  --  2.1* 2.5* 2.5* 2.2* 2.1*  --      Estimated Creatinine Clearance: 106.9 mL/min (by C-G formula based on SCr of 0.82 mg/dL).    Allergies  Allergen Reactions   Codeine Other (See Comments)    unknown    Antimicrobials this admission: Vanc 7/16  Dapto 7/16 >> Pip/tazo 7/16 >>   7/16 BL CK 112 7/18 CK 48   Microbiology results: 7/16 BCx: pend 7/16 wound: pend     Thank you for allowing pharmacy to be a part of this patient's care.  8/18, PharmD, BCPS, BCCP Clinical Pharmacist  Please check AMION for all [facility] Pharmacy phone numbers After 10:00 PM, call Main Pharmacy [phone]

## 2020-12-29 NOTE — Progress Notes (Signed)
Electrophysiology Rounding Note  Patient Name: Luis [name] Date of Encounter: 12/29/2020  Primary Cardiologist: None Electrophysiologist: New to Dr. Lalla Brothers   Subjective   The patient is doing well today.  At this time, the patient denies chest pain, shortness of breath, or any new concerns.  Inpatient Medications    Scheduled Meds:  aspirin EC  81 mg Oral Daily   enoxaparin (LOVENOX) injection  40 mg Subcutaneous Q24H   folic acid  1 mg Oral Daily   insulin aspart  0-15 Units Subcutaneous TID WC   insulin aspart  0-5 Units Subcutaneous QHS   mouth rinse  15 mL Mouth Rinse BID   multivitamin with minerals  1 tablet Oral Daily   nicotine  21 mg Transdermal Daily   thiamine  100 mg Oral Daily   Or   thiamine  100 mg Intravenous Daily   Continuous Infusions:  DAPTOmycin (CUBICIN)  IV Stopped (12/28/20 2210)   piperacillin-tazobactam (ZOSYN)  IV 3.375 g (12/29/20 0756)   PRN Meds: acetaminophen, nitroGLYCERIN, ondansetron (ZOFRAN) IV, traMADol   Vital Signs    Vitals:   12/28/20 2132 12/28/20 2345 12/29/20 0324 12/29/20 0340  BP:  136/87 (!) 149/82   Pulse: (!) 43 (!) 41 (!) 40   Resp:  16 16   Temp:  98.8 F (37.1 C) 98.7 F (37.1 C)   TempSrc:  Oral Oral   SpO2:  94% 93%   Weight:    91.1 kg  Height:        Intake/Output Summary (Last 24 hours) at 12/29/2020 0813 Last data filed at 12/29/2020 8295 Gross per 24 hour  Intake 657.45 ml  Output 275 ml  Net 382.45 ml   Filed Weights   12/25/20 2017 12/26/20 0419 12/29/20 0340  Weight: 95 kg 98.9 kg 91.1 kg    Physical Exam    GEN- The patient is well appearing, alert and oriented x 3 today.   Head- normocephalic, atraumatic Eyes-  Sclera clear, conjunctiva pink Ears- hearing intact Oropharynx- clear Neck- supple Lungs- Clear to ausculation bilaterally, normal work of breathing Heart-  Slow but regular  rate and rhythm, no murmurs, rubs or gallops GI- soft, NT, ND, + BS Extremities- no clubbing or  cyanosis. No edema Skin- no rash or lesion Psych- euthymic mood, full affect Neuro- strength and sensation are intact  Labs    CBC Recent Labs    12/29/20 0148  WBC 7.5  HGB 11.4*  HCT 33.6*  MCV 104.7*  PLT 185   Basic Metabolic Panel Recent Labs    62/13/08 0148  NA 137  K 3.8  CL 107  CO2 22  GLUCOSE 98  BUN 14  CREATININE 0.82  CALCIUM 8.9   Liver Function Tests No results for input(s): AST, ALT, ALKPHOS, BILITOT, PROT, ALBUMIN in the last 72 hours. No results for input(s): LIPASE, AMYLASE in the last 72 hours. Cardiac Enzymes Recent Labs    12/26/20 1218 12/28/20 0630  CKTOTAL 112 48*    Telemetry    Complete heart block 40s (personally reviewed)  Radiology    MR CARDIAC MORPHOLOGY W WO CONTRAST  Result Date: 12/28/2020 CLINICAL DATA:  Complete heart block EXAM: CARDIAC MRI TECHNIQUE: The patient was scanned on a 1.5 Tesla GE magnet. A dedicated cardiac coil was used. Functional imaging was done using Fiesta sequences. 2,3, and 4 chamber views were done to assess for RWMA's. Modified Simpson's rule using a short axis stack was used to calculate an ejection fraction  on a dedicated work [employer], [employer]. The patient received 8 cc of Gadavist. After 10 minutes inversion recovery sequences were used to assess for infiltration and scar tissue. FINDINGS: Bilateral moderate pleural effusions were present. Airspace disease left > right base. Trivial pericardial effusion. Normal left ventricular size with mild LV hypertrophy. Normal wall motion with LV EF 72%. Normal right ventricular size and systolic function, EF 55%. Mild left and right atrial enlargement. Mitral regurgitation is likely mild. The aortic valve was calcified and trileaflet. No aortic regurgitation and probably no significant stenosis. On delayed enhancement images, there was mid-wall basal anteroseptal late gadolinium enhancement (LGE) and mid-wall basal inferolateral LGE, relatively small  area appears to be involved. Measurements: LVEDV 227 mL LVSV 163 mL LVEF 72% RVEDV 238 mL RVSV 130 mL RVEF 55% ECV 27% in septum IMPRESSION: 1.  Normal LV size with mild LV hypertrophy, EF 72%. 2.  Normal RV size with EF 55%. 3.  Normal ECV percentage is not suggestive of cardiac amyloidosis. 4. LGE noted in the mid-wall basal anteroseptum and mid-wall basal inferolateral wall. This is not a coronary disease pattern, could be consistent with cardiac sarcoidosis in the appropriate clinical situation, also prior myocarditis. Would suggest high resolution CT chest to look for any evidence for pulmonary sarcoidosis. Dalton Mclean Electronically Signed   By: Marca Ancona M.D.   On: 12/28/2020 15:57    Patient Profile     60yo man with perforated diverticulitis in the past with surgical intervention c/b fascial dehiscence and chronic skin wound presented with new onset CHB  Assessment & Plan    Complete heart block Unclear etiology. Endocarditis still on the differential. Also ? Infiltrative process w/ sarcoid. He is not a candidate at this point for a permanent transvenous pacemaker--his abomdinal infection makes his infection risk prohibitively high.  - if escape rhythm becomes unstable, he will require activation of the cath lab for a temporary transvenous pacemaker wire cMRI 12/28/2020 with LVEF 72% and mid-wall basal anteroseptal LGE and mid-wall basal inferolateral LGE concerning for sarcoidosis.   2. Abdominal Wound Appreciate ID, gen surg assistance Bcx x 2 NG x 72 hours.   3. ? Sarcoidosis Findings on cMRI with LGE in a non coronary disease pattern concerning for infiltrative process.  High Res Chest CT ordered for today If felt to be sarcoid, with cardiac manifestations he would be candidate for immunosuppressive therapies. This would further complicate his abdominal wound management.   With increasingly complicated picture with co-morbidities that span several sub-specialities, patient  may ultimately benefit from transfer to a tertiary center. Will consider reaching out to [facility] based on results of High Res Chest CT and likely interdisciplinary conversations   For questions or updates, please contact [facility] Please consult [email or web address] for contact info under Cardiology/STEMI.  Signed, Graciella Freer, PA-C  12/29/2020, 8:13 AM

## 2020-12-30 ENCOUNTER — Encounter (HOSPITAL_COMMUNITY)
Admission: EM | Disposition: A | Discharge status: Home / Self Care | Payer: Self-pay | Source: Home / Self Care | Attending: Cardiology

## 2020-12-30 LAB — BASIC METABOLIC PANEL
Anion gap: 10 (ref 5–15)
BUN: 13 mg/dL (ref 8–23)
CO2: 23 mmol/L (ref 22–32)
Calcium: 9 mg/dL (ref 8.9–10.3)
Chloride: 102 mmol/L (ref 98–111)
Creatinine, Ser: 0.78 mg/dL (ref 0.61–1.24)
GFR, Estimated: >60 mL/min (ref >60)
Glucose, Bld: 135 mg/dL — ABNORMAL HIGH (ref 70–99)
Potassium: 3.6 mmol/L (ref 3.5–5.1)
Sodium: 135 mmol/L (ref 135–145)

## 2020-12-30 LAB — GLUCOSE, CAPILLARY
Glucose-Capillary: 96 mg/dL (ref 70–99)
Glucose-Capillary: 133 mg/dL — ABNORMAL HIGH (ref 70–99)
Glucose-Capillary: 111 mg/dL — ABNORMAL HIGH (ref 70–99)
Glucose-Capillary: 138 mg/dL — ABNORMAL HIGH (ref 70–99)

## 2020-12-30 LAB — MAGNESIUM: Magnesium: 1.6 mg/dL — ABNORMAL LOW (ref 1.7–2.4)

## 2020-12-30 LAB — CBC WITH DIFFERENTIAL/PLATELET
Abs Immature Granulocytes: 0.02 10*3/uL (ref 0.00–0.07)
Basophils Absolute: 0.1 10*3/uL (ref 0.0–0.1)
Basophils Relative: 1 %
Eosinophils Absolute: 0.2 10*3/uL (ref 0.0–0.5)
Eosinophils Relative: 3 %
HCT: 32.3 % — ABNORMAL LOW (ref 39.0–52.0)
Hemoglobin: 11.1 g/dL — ABNORMAL LOW (ref 13.0–17.0)
Immature Granulocytes: 0 %
Lymphocytes Relative: 18 %
Lymphs Abs: 1.4 10*3/uL (ref 0.7–4.0)
MCH: 35.8 pg — ABNORMAL HIGH (ref 26.0–34.0)
MCHC: 34.4 g/dL (ref 30.0–36.0)
MCV: 104.2 fL — ABNORMAL HIGH (ref 80.0–100.0)
Monocytes Absolute: 0.9 10*3/uL (ref 0.1–1.0)
Monocytes Relative: 12 %
Neutro Abs: 5.2 10*3/uL (ref 1.7–7.7)
Neutrophils Relative %: 66 %
Platelets: 185 10*3/uL (ref 150–400)
RBC: 3.1 MIL/uL — ABNORMAL LOW (ref 4.22–5.81)
RDW: 13 % (ref 11.5–15.5)
WBC: 7.8 10*3/uL (ref 4.0–10.5)
nRBC: 0 % (ref 0.0–0.2)

## 2020-12-30 SURGERY — ECHOCARDIOGRAM, TRANSESOPHAGEAL
Anesthesia: Monitor Anesthesia Care

## 2020-12-30 MED ORDER — CEFAZOLIN SODIUM-DEXTROSE 2-4 GM/100ML-% IV SOLN
2.0000 g | INTRAVENOUS | Status: AC
  Administered 2020-12-31: 2 g via INTRAVENOUS
  Filled 2020-12-30: qty 100

## 2020-12-30 MED ORDER — SODIUM CHLORIDE 0.45 % IV SOLN: INTRAVENOUS | Status: DC

## 2020-12-30 MED ORDER — SODIUM CHLORIDE 0.9 % IV SOLN: INTRAVENOUS | Status: DC

## 2020-12-30 MED ORDER — LORAZEPAM 1 MG PO TABS
1.0000 mg | ORAL_TABLET | Freq: Every evening | ORAL | Status: DC | PRN
  Administered 2020-12-30 – 2020-12-31 (×2): 1 mg via ORAL
  Filled 2020-12-30 (×2): qty 1

## 2020-12-30 NOTE — Progress Notes (Signed)
Electrophysiology Rounding Note  Patient Name: Luis [name] Date of Encounter: 12/30/2020  Primary Cardiologist: None Electrophysiologist: Dr. Lalla Brothers   Subjective   The patient is doing well today.  At this time, the patient denies chest pain, shortness of breath, or any new concerns.  Inpatient Medications    Scheduled Meds:  aspirin EC  81 mg Oral Daily   Chlorhexidine Gluconate Cloth  6 each Topical Q0600   enoxaparin (LOVENOX) injection  40 mg Subcutaneous Q24H   folic acid  1 mg Oral Daily   insulin aspart  0-15 Units Subcutaneous TID WC   insulin aspart  0-5 Units Subcutaneous QHS   mouth rinse  15 mL Mouth Rinse BID   multivitamin with minerals  1 tablet Oral Daily   mupirocin ointment  1 application Nasal BID   nicotine  21 mg Transdermal Daily   thiamine  100 mg Oral Daily   Or   thiamine  100 mg Intravenous Daily   Continuous Infusions:  PRN Meds: acetaminophen, nitroGLYCERIN, ondansetron (ZOFRAN) IV, traMADol   Vital Signs    Vitals:   12/29/20 1400 12/29/20 2131 12/30/20 0613 12/30/20 0730  BP: 138/78 (!) 161/82 (!) 163/81 (!) 168/86  Pulse: (!) 44   (!) 40  Resp: 16 18 18 16   Temp: 98.4 F (36.9 C) 98.5 F (36.9 C) 97.8 F (36.6 C) 97.8 F (36.6 C)  TempSrc: Axillary Oral Oral Oral  SpO2: 99%   97%  Weight:      Height:        Intake/Output Summary (Last 24 hours) at 12/30/2020 0932 Last data filed at 12/29/2020 2130 Gross per 24 hour  Intake 240 ml  Output --  Net 240 ml   Filed Weights   12/25/20 2017 12/26/20 0419 12/29/20 0340  Weight: 95 kg 98.9 kg 91.1 kg    Physical Exam    GEN- The patient is well appearing, alert and oriented x 3 today.   Head- normocephalic, atraumatic Eyes-  Sclera clear, conjunctiva pink Ears- hearing intact Oropharynx- clear Neck- supple Lungs- Clear to ausculation bilaterally, normal work of breathing Heart-  Slow but regular  rate and rhythm, no murmurs, rubs or gallops GI- soft, NT, ND, +  BS Extremities- no clubbing or cyanosis. No edema Skin- no rash or lesion Psych- euthymic mood, full affect Neuro- strength and sensation are intact  Labs    CBC Recent Labs    12/29/20 0148 12/30/20 0030  WBC 7.5 7.8  NEUTROABS  --  5.2  HGB 11.4* 11.1*  HCT 33.6* 32.3*  MCV 104.7* 104.2*  PLT 185 185   Basic Metabolic Panel Recent Labs    01/01/21 0148 12/30/20 0030  NA 137 135  K 3.8 3.6  CL 107 102  CO2 22 23  GLUCOSE 98 135*  BUN 14 13  CREATININE 0.82 0.78  CALCIUM 8.9 9.0  MG  --  1.6*   Liver Function Tests No results for input(s): AST, ALT, ALKPHOS, BILITOT, PROT, ALBUMIN in the last 72 hours. No results for input(s): LIPASE, AMYLASE in the last 72 hours. Cardiac Enzymes Recent Labs    12/28/20 0630  CKTOTAL 48*     Telemetry    Remains in CHB in 40s (personally reviewed)  Radiology    CT Chest High Resolution  Result Date: 12/29/2020 CLINICAL DATA:  Inpatient. Dyspnea. 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 EXAM: CT CHEST WITHOUT CONTRAST TECHNIQUE: Multidetector CT imaging of the chest was performed following  the standard protocol without intravenous contrast. High resolution imaging of the lungs, as well as inspiratory and expiratory imaging, was performed. COMPARISON:  12/25/2020 chest CT angiogram. FINDINGS: Cardiovascular: Normal heart size. No significant pericardial effusion/thickening. Three-vessel coronary atherosclerosis Atherosclerotic nonaneurysmal thoracic aorta. Top-normal caliber main pulmonary artery (3.1 cm diameter). Mediastinum/Nodes: No discrete thyroid nodules. Unremarkable esophagus. No pathologically enlarged axillary, mediastinal or hilar lymph nodes, noting limited sensitivity for the detection of hilar adenopathy on this noncontrast study. Lungs/Pleura: No pneumothorax. Small dependent bilateral pleural effusions, slightly increased bilaterally. Mild to moderate passive atelectasis in the  dependent lower lobes bilaterally. Mild centrilobular emphysema with mild diffuse bronchial wall thickening. No lung masses or significant pulmonary nodules. No central airway stenoses. Mild-to-moderate patchy ground-glass opacities in peribronchovascular posterior left upper lobe and central left lower lobe, increased since 12/25/2020 chest CT. Mild diffuse interlobular septal thickening. No significant regions of subpleural reticulation, traction bronchiectasis or frank honeycombing. No significant lobular air trapping or evidence of tracheobronchomalacia on the expiration sequence. Upper abdomen: No acute abnormality. Musculoskeletal: No aggressive appearing focal osseous lesions. Mild thoracic spondylosis. IMPRESSION: 1. Mild-to-moderate patchy ground-glass opacities in the peribronchovascular posterior left upper and central left lower lobes, increased since 12/25/2020 chest CT. Favor mild pulmonary edema given the generalized mild interlobular septal thickening in both lungs, although developing infection is not excluded. 2. Small dependent bilateral pleural effusions, slightly increased bilaterally. 3. Mild centrilobular emphysema with mild diffuse bronchial wall thickening, suggesting COPD. 4. No evidence of interstitial lung disease. No thoracic adenopathy. 5. Three-vessel coronary atherosclerosis. 6. Aortic Atherosclerosis (ICD10-I70.0) and Emphysema (ICD10-J43.9). Electronically Signed   By: Delbert Phenix M.D.   On: 12/29/2020 11:46   MR CARDIAC MORPHOLOGY W WO CONTRAST  Result Date: 12/28/2020 CLINICAL DATA:  Complete heart block EXAM: CARDIAC MRI TECHNIQUE: The patient was scanned on a 1.5 Tesla GE magnet. A dedicated cardiac coil was used. Functional imaging was done using Fiesta sequences. 2,3, and 4 chamber views were done to assess for RWMA's. Modified Simpson's rule using a short axis stack was used to calculate an ejection fraction on a dedicated work [employer]. The patient  received 8 cc of Gadavist. After 10 minutes inversion recovery sequences were used to assess for infiltration and scar tissue. FINDINGS: Bilateral moderate pleural effusions were present. Airspace disease left > right base. Trivial pericardial effusion. Normal left ventricular size with mild LV hypertrophy. Normal wall motion with LV EF 72%. Normal right ventricular size and systolic function, EF 55%. Mild left and right atrial enlargement. Mitral regurgitation is likely mild. The aortic valve was calcified and trileaflet. No aortic regurgitation and probably no significant stenosis. On delayed enhancement images, there was mid-wall basal anteroseptal late gadolinium enhancement (LGE) and mid-wall basal inferolateral LGE, relatively small area appears to be involved. Measurements: LVEDV 227 mL LVSV 163 mL LVEF 72% RVEDV 238 mL RVSV 130 mL RVEF 55% ECV 27% in septum IMPRESSION: 1.  Normal LV size with mild LV hypertrophy, EF 72%. 2.  Normal RV size with EF 55%. 3.  Normal ECV percentage is not suggestive of cardiac amyloidosis. 4. LGE noted in the mid-wall basal anteroseptum and mid-wall basal inferolateral wall. This is not a coronary disease pattern, could be consistent with cardiac sarcoidosis in the appropriate clinical situation, also prior myocarditis. Would suggest high resolution CT chest to look for any evidence for pulmonary sarcoidosis. Dalton Mclean Electronically Signed   By: Marca Ancona M.D.   On: 12/28/2020 15:57    Patient Profile  60yo man with perforated diverticulitis in the past with surgical intervention c/b fascial dehiscence and chronic skin wound presented with new onset CHB  Assessment & Plan    Complete heart block By cMRI likely 2/2 to myocardial inflammation of unclear etiology. DDx includes myocarditis vs sarcoidosis.  He is not a candidate at this point for a permanent transvenous pacemaker--his abdominal infection makes his infection risk prohibitively high.  cMRI  12/28/2020 with LVEF 72% and mid-wall basal anteroseptal LGE and mid-wall basal inferolateral LGE concerning for sarcoidosis. We will plan Micra AV to treat his CHB as a bridge for him to under-go treatment of #2 and further work up of #3..    2. Chronic Abdominal Wound Appreciate ID, gen surg assistance Bcx x 2 NG x 4 days.  Confirmed with Dr. Sueanne Margarita office that patients information has been sent to [facility] wound clinic. Pt needs to call to set up appointment. They have re-faxed his information to prompt a call/appointment as well.    3. Myocardial inflammation on cMRI Findings on cMRI with LGE in a non coronary disease pattern concerning for infiltrative process. High Res Chest CT without signs of sarcoidosis. Likely will need further work up, possible at [facility] as outpatient. But as above will need abdominal wound managed first.    May ultimately need an ICD in the future if determined to be sarcoidosis.  4. ETOH use Have stressed the importance of cessation  Have discussed plan at length with patient and his sister to include 1: PPM placement (Micra AV) 2. Follow up at [facility] for his chronic abdominal wound 3. Referral to [facility] for further work up of myocardial inflammatory process.  For questions or updates, please contact [facility] Please consult [email or web address] for contact info under Cardiology/STEMI.  Signed, Graciella Freer, PA-C  12/30/2020, 9:32 AM

## 2020-12-31 ENCOUNTER — Inpatient Hospital Stay (HOSPITAL_COMMUNITY): Payer: 59 | Admitting: Anesthesiology

## 2020-12-31 ENCOUNTER — Encounter (HOSPITAL_COMMUNITY)
Admission: EM | Disposition: A | Discharge status: Home / Self Care | Payer: Self-pay | Source: Home / Self Care | Attending: Cardiology

## 2020-12-31 DIAGNOSIS — I442 Atrioventricular block, complete: Secondary | ICD-10-CM

## 2020-12-31 DIAGNOSIS — Z006 Encounter for examination for normal comparison and control in clinical research program: Secondary | ICD-10-CM

## 2020-12-31 HISTORY — PX: PACEMAKER LEADLESS INSERTION: EP1219

## 2020-12-31 LAB — BASIC METABOLIC PANEL
Anion gap: 9 (ref 5–15)
BUN: 14 mg/dL (ref 8–23)
CO2: 20 mmol/L — ABNORMAL LOW (ref 22–32)
Calcium: 9.1 mg/dL (ref 8.9–10.3)
Chloride: 105 mmol/L (ref 98–111)
Creatinine, Ser: 0.72 mg/dL (ref 0.61–1.24)
GFR, Estimated: >60 mL/min (ref >60)
Glucose, Bld: 91 mg/dL (ref 70–99)
Potassium: 3.6 mmol/L (ref 3.5–5.1)
Sodium: 134 mmol/L — ABNORMAL LOW (ref 135–145)

## 2020-12-31 LAB — GLUCOSE, CAPILLARY
Glucose-Capillary: 102 mg/dL — ABNORMAL HIGH (ref 70–99)
Glucose-Capillary: 94 mg/dL (ref 70–99)
Glucose-Capillary: 93 mg/dL (ref 70–99)
Glucose-Capillary: 113 mg/dL — ABNORMAL HIGH (ref 70–99)
Glucose-Capillary: 186 mg/dL — ABNORMAL HIGH (ref 70–99)

## 2020-12-31 LAB — CULTURE, BLOOD (ROUTINE X 2)
Culture: NO GROWTH
Culture: NO GROWTH
Special Requests: ADEQUATE
Special Requests: ADEQUATE

## 2020-12-31 SURGERY — PACEMAKER LEADLESS INSERTION
Anesthesia: General

## 2020-12-31 MED ORDER — MIDAZOLAM HCL 2 MG/2ML IJ SOLN
INTRAMUSCULAR | Status: DC | PRN
  Administered 2020-12-31: 2 mg via INTRAVENOUS

## 2020-12-31 MED ORDER — ONDANSETRON HCL 4 MG/2ML IJ SOLN
INTRAMUSCULAR | Status: DC | PRN
  Administered 2020-12-31: 4 mg via INTRAVENOUS

## 2020-12-31 MED ORDER — ACETAMINOPHEN 325 MG PO TABS
650.0000 mg | ORAL_TABLET | ORAL | Status: DC | PRN
  Administered 2020-12-31 – 2021-01-01 (×2): 650 mg via ORAL
  Filled 2020-12-31 (×2): qty 2

## 2020-12-31 MED ORDER — SODIUM CHLORIDE 0.9% FLUSH: 3.0000 mL | INTRAVENOUS | Status: DC | PRN

## 2020-12-31 MED ORDER — SODIUM CHLORIDE 0.45 % IV SOLN
INTRAVENOUS | Status: DC
Start: 2020-12-31 — End: 2020-12-31

## 2020-12-31 MED ORDER — CEFAZOLIN SODIUM-DEXTROSE 2-4 GM/100ML-% IV SOLN
INTRAVENOUS | Status: AC
  Filled 2020-12-31: qty 100

## 2020-12-31 MED ORDER — SODIUM CHLORIDE 0.9% FLUSH
3.0000 mL | Freq: Two times a day (BID) | INTRAVENOUS | Status: DC
  Administered 2020-12-31 (×2): 3 mL via INTRAVENOUS

## 2020-12-31 MED ORDER — LIDOCAINE 2% (20 MG/ML) 5 ML SYRINGE
INTRAMUSCULAR | Status: DC | PRN
  Administered 2020-12-31: 20 mg via INTRAVENOUS

## 2020-12-31 MED ORDER — ONDANSETRON HCL 4 MG/2ML IJ SOLN: 4.0000 mg | Freq: Four times a day (QID) | INTRAMUSCULAR | Status: DC | PRN

## 2020-12-31 MED ORDER — FENTANYL CITRATE (PF) 250 MCG/5ML IJ SOLN
INTRAMUSCULAR | Status: DC | PRN
  Administered 2020-12-31: 100 ug via INTRAVENOUS

## 2020-12-31 MED ORDER — BUPIVACAINE HCL (PF) 0.25 % IJ SOLN
INTRAMUSCULAR | Status: AC
  Filled 2020-12-31: qty 30

## 2020-12-31 MED ORDER — BUPIVACAINE HCL (PF) 0.25 % IJ SOLN
INTRAMUSCULAR | Status: DC | PRN
  Administered 2020-12-31: 30 mL

## 2020-12-31 MED ORDER — HEPARIN (PORCINE) IN NACL 1000-0.9 UT/500ML-% IV SOLN
INTRAVENOUS | Status: DC | PRN
  Administered 2020-12-31 (×2): 500 mL

## 2020-12-31 MED ORDER — DEXAMETHASONE SODIUM PHOSPHATE 10 MG/ML IJ SOLN
INTRAMUSCULAR | Status: DC | PRN
  Administered 2020-12-31: 10 mg via INTRAVENOUS

## 2020-12-31 MED ORDER — HEPARIN SODIUM (PORCINE) 1000 UNIT/ML IJ SOLN
INTRAMUSCULAR | Status: DC | PRN
  Administered 2020-12-31: 3000 [IU] via INTRAVENOUS

## 2020-12-31 MED ORDER — SODIUM CHLORIDE 0.9 % IV SOLN
INTRAVENOUS | Status: AC
  Filled 2020-12-31: qty 2

## 2020-12-31 MED ORDER — SUGAMMADEX SODIUM 200 MG/2ML IV SOLN
INTRAVENOUS | Status: DC | PRN
  Administered 2020-12-31: 200 mg via INTRAVENOUS

## 2020-12-31 MED ORDER — ROCURONIUM BROMIDE 10 MG/ML (PF) SYRINGE
PREFILLED_SYRINGE | INTRAVENOUS | Status: DC | PRN
  Administered 2020-12-31: 80 mg via INTRAVENOUS

## 2020-12-31 MED ORDER — PROPOFOL 10 MG/ML IV BOLUS
INTRAVENOUS | Status: DC | PRN
  Administered 2020-12-31: 100 mg via INTRAVENOUS

## 2020-12-31 MED ORDER — HEPARIN (PORCINE) IN NACL 1000-0.9 UT/500ML-% IV SOLN
INTRAVENOUS | Status: AC
  Filled 2020-12-31: qty 1000

## 2020-12-31 MED ORDER — SODIUM CHLORIDE 0.9 % IV SOLN: 250.0000 mL | INTRAVENOUS | Status: DC | PRN

## 2020-12-31 MED ORDER — EPHEDRINE SULFATE-NACL 50-0.9 MG/10ML-% IV SOSY
PREFILLED_SYRINGE | INTRAVENOUS | Status: DC | PRN
  Administered 2020-12-31: 10 mg via INTRAVENOUS

## 2020-12-31 MED ORDER — SODIUM CHLORIDE 0.9 % IV SOLN: INTRAVENOUS | Status: DC

## 2020-12-31 SURGICAL SUPPLY — 11 items
CABLE SURGICAL S-101-97-12 (CABLE) ×2 IMPLANT
CLOSURE PERCLOSE PROSTYLE (VASCULAR PRODUCTS) ×4 IMPLANT
MICRA AV TRANSCATH PACING SYS (Pacemaker) ×2 IMPLANT
MICRA INTRODUCER SHEATH (SHEATH) ×4
PAD PRO RADIOLUCENT 2001M-C (PAD) ×2 IMPLANT
SHEATH DILAT COONS TAPER 22F (SHEATH) ×2 IMPLANT
SHEATH INTRODUCER MICRA (SHEATH) ×2 IMPLANT
SHEATH PINNACLE 8F 10CM (SHEATH) ×2 IMPLANT
SYSTEM PACING TRNSCTH AV MICRA (Pacemaker) ×1 IMPLANT
TRAY PACEMAKER INSERTION (PACKS) ×2 IMPLANT
WIRE AMPLATZ SS-J .035X180CM (WIRE) ×2 IMPLANT

## 2020-12-31 NOTE — Progress Notes (Signed)
Electrophysiology Rounding Note  Patient Name: Luis [name] Date of Encounter: 12/31/2020  Primary Cardiologist: None Electrophysiologist: Dr. Lalla Brothers   Subjective   The patient is doing well today.  At this time, the patient denies chest pain, shortness of breath, or any new concerns.  Inpatient Medications    Scheduled Meds:  aspirin EC  81 mg Oral Daily   Chlorhexidine Gluconate Cloth  6 each Topical Q0600   folic acid  1 mg Oral Daily   mouth rinse  15 mL Mouth Rinse BID   multivitamin with minerals  1 tablet Oral Daily   mupirocin ointment  1 application Nasal BID   nicotine  21 mg Transdermal Daily   thiamine  100 mg Oral Daily   Or   thiamine  100 mg Intravenous Daily   Continuous Infusions:  sodium chloride 10 mL/hr at 12/31/20 0729   sodium chloride 20 mL/hr at 12/31/20 0729    ceFAZolin (ANCEF) IV     PRN Meds: acetaminophen, LORazepam, nitroGLYCERIN, ondansetron (ZOFRAN) IV, traMADol   Vital Signs    Vitals:   12/30/20 0730 12/30/20 2051 12/31/20 0607 12/31/20 0738  BP: (!) 168/86 (!) 159/84 (!) 164/80 (!) 164/74  Pulse: (!) 40 (!) 43 (!) 40 (!) 42  Resp: 16 17 16 18   Temp: 97.8 F (36.6 C) 98.1 F (36.7 C) 98.2 F (36.8 C) 98.2 F (36.8 C)  TempSrc: Oral Oral Oral Oral  SpO2: 97% 98% 98% 97%  Weight:   87.9 kg   Height:        Intake/Output Summary (Last 24 hours) at 12/31/2020 0832 Last data filed at 12/30/2020 2200 Gross per 24 hour  Intake 240 ml  Output --  Net 240 ml   Filed Weights   12/26/20 0419 12/29/20 0340 12/31/20 0607  Weight: 98.9 kg 91.1 kg 87.9 kg    Physical Exam    GEN- The patient is well appearing, alert and oriented x 3 today.   Head- normocephalic, atraumatic Eyes-  Sclera clear, conjunctiva pink Ears- hearing intact Oropharynx- clear Neck- supple Lungs- Clear to ausculation bilaterally, normal work of breathing Heart-  slow but regular  rate and rhythm, no murmurs, rubs or gallops GI- soft, NT, ND, +  BS Extremities- no clubbing or cyanosis. No edema Skin- no rash or lesion Psych- euthymic mood, full affect Neuro- strength and sensation are intact  Labs    CBC Recent Labs    12/29/20 0148 12/30/20 0030  WBC 7.5 7.8  NEUTROABS  --  5.2  HGB 11.4* 11.1*  HCT 33.6* 32.3*  MCV 104.7* 104.2*  PLT 185 185   Basic Metabolic Panel Recent Labs    01/01/21 0030 12/31/20 0334  NA 135 134*  K 3.6 3.6  CL 102 105  CO2 23 20*  GLUCOSE 135* 91  BUN 13 14  CREATININE 0.78 0.72  CALCIUM 9.0 9.1  MG 1.6*  --    Liver Function Tests No results for input(s): AST, ALT, ALKPHOS, BILITOT, PROT, ALBUMIN in the last 72 hours. No results for input(s): LIPASE, AMYLASE in the last 72 hours. Cardiac Enzymes No results for input(s): CKTOTAL, CKMB, CKMBINDEX, TROPONINI in the last 72 hours.   Telemetry    CHB in 40s (personally reviewed)  Radiology    CT Chest High Resolution  Result Date: 12/29/2020 CLINICAL DATA:  Inpatient. Dyspnea. 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 EXAM: CT CHEST WITHOUT CONTRAST TECHNIQUE: Multidetector CT imaging of the chest was performed  following the standard protocol without intravenous contrast. High resolution imaging of the lungs, as well as inspiratory and expiratory imaging, was performed. COMPARISON:  12/25/2020 chest CT angiogram. FINDINGS: Cardiovascular: Normal heart size. No significant pericardial effusion/thickening. Three-vessel coronary atherosclerosis Atherosclerotic nonaneurysmal thoracic aorta. Top-normal caliber main pulmonary artery (3.1 cm diameter). Mediastinum/Nodes: No discrete thyroid nodules. Unremarkable esophagus. No pathologically enlarged axillary, mediastinal or hilar lymph nodes, noting limited sensitivity for the detection of hilar adenopathy on this noncontrast study. Lungs/Pleura: No pneumothorax. Small dependent bilateral pleural effusions, slightly increased bilaterally. Mild to moderate passive  atelectasis in the dependent lower lobes bilaterally. Mild centrilobular emphysema with mild diffuse bronchial wall thickening. No lung masses or significant pulmonary nodules. No central airway stenoses. Mild-to-moderate patchy ground-glass opacities in peribronchovascular posterior left upper lobe and central left lower lobe, increased since 12/25/2020 chest CT. Mild diffuse interlobular septal thickening. No significant regions of subpleural reticulation, traction bronchiectasis or frank honeycombing. No significant lobular air trapping or evidence of tracheobronchomalacia on the expiration sequence. Upper abdomen: No acute abnormality. Musculoskeletal: No aggressive appearing focal osseous lesions. Mild thoracic spondylosis. IMPRESSION: 1. Mild-to-moderate patchy ground-glass opacities in the peribronchovascular posterior left upper and central left lower lobes, increased since 12/25/2020 chest CT. Favor mild pulmonary edema given the generalized mild interlobular septal thickening in both lungs, although developing infection is not excluded. 2. Small dependent bilateral pleural effusions, slightly increased bilaterally. 3. Mild centrilobular emphysema with mild diffuse bronchial wall thickening, suggesting COPD. 4. No evidence of interstitial lung disease. No thoracic adenopathy. 5. Three-vessel coronary atherosclerosis. 6. Aortic Atherosclerosis (ICD10-I70.0) and Emphysema (ICD10-J43.9). Electronically Signed   By: Delbert Phenix M.D.   On: 12/29/2020 11:46    Patient Profile     60yo man with perforated diverticulitis in the past with surgical intervention c/b fascial dehiscence and chronic skin wound presented with new onset CHB  Assessment & Plan    Complete heart block By cMRI likely 2/2 to myocardial inflammation of unclear etiology. DDx includes myocarditis vs sarcoidosis.  He is not a candidate at this point for a permanent transvenous pacemaker--his abdominal infection makes his infection risk  prohibitively high.  cMRI 12/28/2020 with LVEF 72% and mid-wall basal anteroseptal LGE and mid-wall basal inferolateral LGE concerning for sarcoidosis. We will plan Micra AV to treat his CHB as a bridge for him to under-go treatment of #2 and further work up of #3. Explained risks, benefits, and alternatives to PPM implantation, including but not limited to bleeding, infection, pneumothorax, pericardial effusion, lead dislodgement, heart attack, stroke, or death.  Pt verbalized understanding and agrees to proceed .    2. Chronic Abdominal Wound Appreciate ID, gen surg assistance Bcx x 2 NG x 4 days.  Confirmed with Dr. Sueanne Margarita office that patients information has been sent to [facility] wound clinic. Pt needs to call to set up appointment. They have re-faxed his information to prompt a call/appointment as well.  Will continue to stress importance of follow up to patient.    3. Myocardial inflammation on cMRI Findings on cMRI with LGE in a non coronary disease pattern concerning for infiltrative process. High Res Chest CT without signs of sarcoidosis. Likely will need further work up, possible at [facility] as outpatient. But as above will need abdominal wound managed first.    May ultimately need an ICD in the future if determined to be sarcoidosis. Further work up as outpatient pending PPM implant.   4. ETOH use Have stressed the importance of cessation   For Leadless PPM  today.    For questions or updates, please contact [facility] Please consult [email or web address] for contact info under Cardiology/STEMI.  Signed, Graciella Freer, PA-C  12/31/2020, 8:32 AM

## 2020-12-31 NOTE — Transfer of Care (Signed)
Immediate Anesthesia Transfer of Care Note  Patient: [name]  Procedure(s) Performed: PACEMAKER LEADLESS INSERTION  Patient Location: Cath Lab  Anesthesia Type:General  Level of Consciousness: drowsy and patient cooperative  Airway & Oxygen Therapy: Patient Spontanous Breathing  Post-op Assessment: Report given to RN and Post -op Vital signs reviewed and stable  Post vital signs: Reviewed and stable  Last Vitals:  Vitals Value Taken Time  BP 157/77 12/31/20 1348  Temp 36.2 C 12/31/20 1349  Pulse 47 12/31/20 1350  Resp 19 12/31/20 1350  SpO2 95 % 12/31/20 1350  Vitals shown include unvalidated device data.  Last Pain:  Vitals:   12/31/20 1349  TempSrc: Temporal  PainSc: 0-No pain      Patients Stated Pain Goal: 0 (12/30/20 0731)  Complications: No notable events documented.

## 2020-12-31 NOTE — Anesthesia Procedure Notes (Signed)
Procedure Name: Intubation Date/Time: 12/31/2020 12:52 PM Performed by: Rosiland Oz, CRNA Pre-anesthesia Checklist: Patient identified, Emergency Drugs available, Suction available, Patient being monitored and Timeout performed Patient Re-evaluated:Patient Re-evaluated prior to induction Oxygen Delivery Method: Circle system utilized Preoxygenation: Pre-oxygenation with 100% oxygen Induction Type: IV induction Ventilation: Mask ventilation without difficulty Laryngoscope Size: Miller and 3 Grade View: Grade II Tube type: Oral Tube size: 7.5 mm Number of attempts: 1 Airway Equipment and Method: Stylet Placement Confirmation: ETT inserted through vocal cords under direct vision, positive ETCO2 and breath sounds checked- equal and bilateral Secured at: 23 cm Tube secured with: Tape Dental Injury: Teeth and Oropharynx as per pre-operative assessment

## 2020-12-31 NOTE — Anesthesia Preprocedure Evaluation (Addendum)
Anesthesia Evaluation  Patient identified by MRN, date of birth, ID band Patient awake    Reviewed: Allergy & Precautions, NPO status , Patient's Chart, lab work & pertinent test results, reviewed documented beta blocker date and time   History of Anesthesia Complications Negative for: history of anesthetic complications  Airway Mallampati: I  TM Distance: >3 FB Neck ROM: Full    Dental no notable dental hx. (+) Teeth Intact, Dental Advisory Given   Pulmonary Current Smoker and Patient abstained from smoking.,    Pulmonary exam normal breath sounds clear to auscultation       Cardiovascular hypertension, Pt. on medications and Pt. 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 Left ventricular ejection fraction, by estimation, is 65 to 70%. The  left ventricle has normal function. The left ventricle has no regional  wall motion abnormalities. There is moderate concentric left ventricular  hypertrophy. Diastolic function  cannot be evaluated due to AV dissociation, but annulus early diastolic  velocities are normal.  2. Right ventricular systolic function is normal. The right ventricular  size is normal. There is normal pulmonary artery systolic pressure.  3. Left atrial size was moderately dilated.  4. 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  The mitral valve is normal in structure. Mild mitral valve regurgitation.  5. The aortic valve is tricuspid. There is mild calcification of the  aortic valve. Aortic valve regurgitation is not visualized. Mild to  moderate aortic valve sclerosis/calcification is present, without any  evidence of aortic stenosis.  6. Aortic dilatation noted. There is borderline dilatation of the aortic  root, measuring 38 mm.  7. The inferior vena cava is dilated in size with >50% respiratory   variability, suggesting right atrial pressure of 8 mmHg.    Neuro/Psych negative neurological ROS  negative psych ROS   GI/Hepatic negative GI ROS, (+)     substance abuse  alcohol use,  diverticulitis with perforation, had colectomy and ileostomy, multiple wound infections and superficial fistula    Endo/Other  diabetes, Type 2, Oral Hypoglycemic Agents  Renal/GU negative Renal ROS  negative genitourinary   Musculoskeletal negative musculoskeletal ROS (+)   Abdominal   Peds  Hematology negative hematology ROS (+)   Anesthesia Other Findings Day of surgery medications reviewed with patient.  Reproductive/Obstetrics negative OB ROS                          Anesthesia Physical Anesthesia Plan  ASA: 4  Anesthesia Plan: General   Post-op Pain Management:    Induction: Intravenous  PONV Risk Score and Plan: 2 and Treatment may vary due to age or medical condition, Ondansetron, Dexamethasone and Midazolam  Airway Management Planned: Oral ETT  Additional Equipment:   Intra-op Plan:   Post-operative Plan: Extubation in OR  Informed Consent:   Plan Discussed with:   Anesthesia Plan Comments:         Anesthesia Quick Evaluation

## 2021-01-01 ENCOUNTER — Inpatient Hospital Stay (HOSPITAL_COMMUNITY): Payer: 59

## 2021-01-01 ENCOUNTER — Encounter (HOSPITAL_COMMUNITY): Payer: Self-pay | Admitting: Cardiology

## 2021-01-01 DIAGNOSIS — Z950 Presence of cardiac pacemaker: Secondary | ICD-10-CM

## 2021-01-01 LAB — BASIC METABOLIC PANEL
Anion gap: 8 (ref 5–15)
BUN: 22 mg/dL (ref 8–23)
CO2: 22 mmol/L (ref 22–32)
Calcium: 9.5 mg/dL (ref 8.9–10.3)
Chloride: 105 mmol/L (ref 98–111)
Creatinine, Ser: 0.85 mg/dL (ref 0.61–1.24)
GFR, Estimated: >60 mL/min (ref >60)
Glucose, Bld: 152 mg/dL — ABNORMAL HIGH (ref 70–99)
Potassium: 4.2 mmol/L (ref 3.5–5.1)
Sodium: 135 mmol/L (ref 135–145)

## 2021-01-01 MED ORDER — THIAMINE HCL 100 MG PO TABS: 100.0000 mg | ORAL_TABLET | Freq: Every day | ORAL | 6 refills | Status: DC

## 2021-01-01 MED ORDER — FOLIC ACID 1 MG PO TABS: 1.0000 mg | ORAL_TABLET | Freq: Every day | ORAL | 6 refills | Status: DC

## 2021-01-01 MED ORDER — MUPIROCIN 2 % EX OINT: 1.0000 "application " | TOPICAL_OINTMENT | Freq: Two times a day (BID) | CUTANEOUS | 0 refills | Status: AC

## 2021-01-01 NOTE — Care Management (Signed)
01-01-21 1529 Late Entry: Case Manager spoke with patient regarding abdominal wound and dressing changes. Patient stated that initially he was able to get home health to follow his wound at home. Patient is now doing his dressing changes. Per patient, he gets supplies via his insurance. No further needs identified by Case Manager at this time. Patient states he has transportation home.

## 2021-01-01 NOTE — Discharge Summary (Signed)
ELECTROPHYSIOLOGY PROCEDURE DISCHARGE SUMMARY    Patient ID: Luis [name],  MRN: [id number], DOB/AGE: 02-02-1961 60 y.o.  Admit date: 12/25/2020 Discharge date: 01/01/2021  Primary Care Physician: Henrine Screws, MD  Primary Cardiologist: None  Electrophysiologist: Dr. Lalla Brothers  Primary Discharge Diagnosis:  Complete heart block status post pacemaker implantation this admission  Secondary Discharge Diagnosis:  Chronic abdominal wound Myocardial inflammation on cMRI ETOH use  Allergies  Allergen Reactions   Codeine Other (See Comments)    unknown    Procedures This Admission:  Echo 12/26/2020 LVEF 65-70% cMRI 12/28/2020 i.  Normal LV size with mild LV hypertrophy, EF 72%. ii.  Normal RV size with EF 55%.  iii.  Normal ECV percentage is not suggestive of cardiac amyloidosis. iv. LGE noted in the mid-wall basal anteroseptum and mid-wall basal inferolateral wall. This is not a coronary disease pattern, could be consistent with cardiac sarcoidosis in the appropriate clinical situation, also prior myocarditis. Would suggest high resolution CT chest to look for any evidence for pulmonary sarcoidosis. 3. High Res Chest CT 12/29/2020 without evidence of ILD (See result note for full results) 4.  Implantation of a Medtronic Micra AV Leadless PPM on 12/31/2020 by Dr. Lalla Brothers. The patient received a Medtronic model number [id number] PPM. There were no immediate post procedure complications. 5.  CXR on 01/01/21 demonstrated no pneumothorax status post device implantation.   Brief HPI: Luis [name] is a 60 y.o. male was admitted for CHB and electrophysiology team asked to see for consideration of PPM implantation.  Past medical history includes above.  The patient has had Complete Heart Block without reversible causes identified, and significant co-morbidities that required management of his CHB to move forward with further work up and treatment.  Risks, benefits, and alternatives to PPM  implantation were reviewed with the patient who wished to proceed.   Hospital Course:  The patient was admitted with complete heart block complicated by a chronic abdominal wound and end ileostomy.   Patient had extensive work up as above that showed myocardial inflammation; ddx including myocarditis vs sarcoidosis. Decision was made that patient needed management of his CHB primarily prior to being able to have his abdominal wounds treated and then having further work up for his myocardial inflammation.   Pt understands that if he has sarcoid, he may eventually need ICD. Currently, his abdominal wounds are prohibitive from an infectious standpoint for transvenous device. Was not a candidate for S-ICD for same reason and need for pacing.   Pt underwent implantation of a Medtronic Micra AV Leadless PPM with details as outlined above.  He was monitored on telemetry overnight which demonstrated appropriate pacing.  Groin was stable on day of discharge. The device was interrogated and optimized morning of discharge. Found to be functioning normally.  CXR was obtained and demonstrated no pneumothorax status post device implantation.  Wound care, arm mobility, and restrictions were reviewed with the patient.  The patient was examined and considered stable for discharge to home.    We stressed multiple times with the patient and his sister the importance of close follow up for management of his abdominal wounds. He knows to call [facility] ASAP. If any difficulty scheduling, should follow up with Dr. Thomos Lemons office who originally arranged referral.   We will follow up on his ability to follow up for sarcoid work up at follow up.   Physical Exam: Vitals:   12/31/20 1755 12/31/20 1825 12/31/20 2005 01/01/21 0606  BP: Marland Kitchen)  142/73 (!) 155/75 (!) 160/90 (!) 167/84  Pulse: 64 61 65 62  Resp:   20 20  Temp:    98 F (36.7 C)  TempSrc:    Oral  SpO2: 93% 94% 96% 98%  Weight:      Height:         GEN- The patient is well appearing, alert and oriented x 3 today.   HEENT: normocephalic, atraumatic; sclera clear, conjunctiva pink; hearing intact; oropharynx clear; neck supple, no JVP Lymph- no cervical lymphadenopathy Lungs- Clear to ausculation bilaterally, normal work of breathing.  No wheezes, rales, rhonchi Heart- Regular rate and rhythm, no murmurs, rubs or gallops, PMI not laterally displaced GI- soft, non-tender, non-distended, bowel sounds present, no hepatosplenomegaly Extremities- no clubbing, cyanosis, or edema; DP/PT/radial pulses 2+ bilaterally MS- no significant deformity or atrophy Skin- warm and dry, no rash or lesion, left chest without hematoma/ecchymosis Psych- euthymic mood, full affect Neuro- strength and sensation are intact   Labs:   Lab Results  Component Value Date   WBC 7.8 12/30/2020   HGB 11.1 (L) 12/30/2020   HCT 32.3 (L) 12/30/2020   MCV 104.2 (H) 12/30/2020   PLT 185 12/30/2020    Recent Labs  Lab 12/25/20 2024 12/26/20 0144 01/01/21 0225  NA 132*   < > 135  K 5.5*   < > 4.2  CL 103   < > 105  CO2 17*   < > 22  BUN 26*   < > 22  CREATININE 1.32*   < > 0.85  CALCIUM 8.9   < > 9.5  PROT 7.4  --   --   BILITOT 0.9  --   --   ALKPHOS 124  --   --   ALT 29  --   --   AST 33  --   --   GLUCOSE 99   < > 152*   < > = values in this interval not displayed.    Discharge Medications:  Allergies as of 01/01/2021       Reactions   Codeine Other (See Comments)   unknown        Medication List     STOP taking these medications    enoxaparin 40 MG/0.4ML injection Commonly known as: LOVENOX   ibuprofen 200 MG tablet Commonly known as: ADVIL       TAKE these medications    acetaminophen 650 MG CR tablet Commonly known as: TYLENOL Take 650 mg by mouth every 8 (eight) hours as needed for pain.   amLODipine 10 MG tablet Commonly known as: NORVASC Take 10 mg by mouth daily.   atorvastatin 10 MG tablet Commonly known as:  LIPITOR Take 10 mg by mouth daily.   folic acid 1 MG tablet Commonly known as: FOLVITE Take 1 tablet (1 mg total) by mouth daily.   metFORMIN 1000 MG tablet Commonly known as: GLUCOPHAGE Take 500 mg by mouth daily.   metoprolol succinate 25 MG 24 hr tablet Commonly known as: TOPROL-XL Take 25 mg by mouth daily.   multivitamin with minerals Tabs tablet Take 1 tablet by mouth every other day.   mupirocin ointment 2 % Commonly known as: BACTROBAN Place 1 application into the nose 2 (two) times daily for 5 days.   thiamine 100 MG tablet Take 1 tablet (100 mg total) by mouth daily.   traMADol 50 MG tablet Commonly known as: Ultram Take 1 tablet (50 mg total) by mouth every 6 (six) hours as needed  for moderate pain.        Disposition:    Follow-up Information     Graciella Freer, PA-C Follow up.   Specialty: Physician Assistant Why: on 9/8 at 1100 am for post pacemaker follow up Contact information: [address] [place] [address] [phone]                 Duration of Discharge Encounter: Greater than 30 minutes including physician time.  Dustin Flock, PA-C  01/01/2021 9:08 AM

## 2021-01-01 NOTE — Progress Notes (Signed)
Discharge instructions reviewed with patient. Vital signs stable PIV's removed. Pt taken down to front entrance via wheelchair to meet sister.

## 2021-01-01 NOTE — Discharge Instructions (Signed)

## 2021-01-01 NOTE — Anesthesia Postprocedure Evaluation (Signed)
Anesthesia Post Note  Patient: Luis [name]  Procedure(s) Performed: PACEMAKER LEADLESS INSERTION     Patient location during evaluation: PACU Anesthesia Type: General Level of consciousness: awake and alert Pain management: pain level controlled Vital Signs Assessment: post-procedure vital signs reviewed and stable Respiratory status: spontaneous breathing, nonlabored ventilation, respiratory function stable and patient connected to nasal cannula oxygen Cardiovascular status: blood pressure returned to baseline and stable Postop Assessment: no apparent nausea or vomiting Anesthetic complications: no   No notable events documented.  Last Vitals:  Vitals:   12/31/20 2005 01/01/21 0606  BP: (!) 160/90 (!) 167/84  Pulse: 65 62  Resp: 20 20  Temp:  36.7 C  SpO2: 96% 98%    Last Pain:  Vitals:   01/01/21 0606  TempSrc: Oral  PainSc:                  Serge Main L Ervan Heber

## 2021-01-06 ENCOUNTER — Encounter (HOSPITAL_COMMUNITY): Payer: Self-pay | Admitting: Cardiology

## 2021-01-06 NOTE — Addendum Note (Signed)
Addendum  created 01/06/21 1530 by Elmer Picker, MD   Intraprocedure Event edited, Intraprocedure Meds edited, Intraprocedure Staff edited

## 2021-01-28 ENCOUNTER — Telehealth: Payer: Self-pay | Admitting: Cardiology

## 2021-01-28 NOTE — Telephone Encounter (Signed)
Patient states for the past week his left hand/arm has been going numb. He states it mainly occurs at night and right now his arm feels fine. Please return call to discuss.

## 2021-01-28 NOTE — Telephone Encounter (Signed)
Pt called with complaints of whole arm going to sleep and this has been going on for sometime  Per pt while on phone complained of hand going to sleep Encouraged pt to contact PCP re arm numbness Pt verbalized understanding .Will forward to Dr Lalla Brothers for review .Zack Seal

## 2021-02-15 ENCOUNTER — Emergency Department (HOSPITAL_COMMUNITY)
Admission: EM | Admit: 2021-02-15 | Discharge: 2021-02-15 | Disposition: A | Discharge status: Home / Self Care | Payer: 59 | Attending: Emergency Medicine | Admitting: Emergency Medicine

## 2021-02-15 ENCOUNTER — Encounter (HOSPITAL_COMMUNITY): Payer: Self-pay | Admitting: Emergency Medicine

## 2021-02-15 ENCOUNTER — Other Ambulatory Visit: Payer: Self-pay

## 2021-02-15 DIAGNOSIS — R58 Hemorrhage, not elsewhere classified: Secondary | ICD-10-CM | POA: Diagnosis not present

## 2021-02-15 DIAGNOSIS — Z7984 Long term (current) use of oral hypoglycemic drugs: Secondary | ICD-10-CM | POA: Insufficient documentation

## 2021-02-15 DIAGNOSIS — Z4800 Encounter for change or removal of nonsurgical wound dressing: Secondary | ICD-10-CM | POA: Insufficient documentation

## 2021-02-15 DIAGNOSIS — I10 Essential (primary) hypertension: Secondary | ICD-10-CM | POA: Insufficient documentation

## 2021-02-15 DIAGNOSIS — Z950 Presence of cardiac pacemaker: Secondary | ICD-10-CM | POA: Insufficient documentation

## 2021-02-15 DIAGNOSIS — E119 Type 2 diabetes mellitus without complications: Secondary | ICD-10-CM | POA: Insufficient documentation

## 2021-02-15 DIAGNOSIS — F17210 Nicotine dependence, cigarettes, uncomplicated: Secondary | ICD-10-CM | POA: Diagnosis not present

## 2021-02-15 DIAGNOSIS — Z79899 Other long term (current) drug therapy: Secondary | ICD-10-CM | POA: Diagnosis not present

## 2021-02-15 DIAGNOSIS — Z5189 Encounter for other specified aftercare: Secondary | ICD-10-CM

## 2021-02-15 NOTE — ED Triage Notes (Signed)
Pt BIB [facility] c/o wound issue. Pt has a large abdominal wound, supposed to change dressing daily, went two days without changing it and tried changing dressing today. Pt believes he might have gotten a scab caught on the old dressing and torn it. Pt was bleeding with EMS, controlled at this time. Denies other complaints.

## 2021-02-15 NOTE — ED Notes (Addendum)
Pts abdominal wound dressed with xeroform and covered with abd pads and tape.

## 2021-02-15 NOTE — Discharge Instructions (Addendum)
Call your primary care doctor or specialist as discussed in the next 2-3 days.   Return immediately back to the ER if:  Your symptoms worsen within the next 12-24 hours. You develop new symptoms such as new fevers, persistent vomiting, new pain, shortness of breath, or new weakness or numbness, or if you have any other concerns.  

## 2021-02-15 NOTE — ED Provider Notes (Signed)
[facility] EMERGENCY DEPARTMENT Provider Note   CSN: [id number] Arrival date & time: 02/15/21  1102     History Chief Complaint  Patient presents with   Wound Check    Luis [name] is a 60 y.o. male.  Patient presents to ER chief complaint of bleeding from his chronic abdominal wound.  Patient states that 2 years ago he had emergent surgery for diverticulitis that resulted in a large abdominal wound that has been healing for the past 2 years.  He does daily dressing changes himself at home.  This morning he was changing his dressing when the gauze was stuck and he ripped off a scab and he noticed bleeding from the wound.  Otherwise no other complaints.  No fever no cough no vomiting no diarrhea.      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   Chronic abdominal wound infection, subsequent encounter 12/26/2020   Alcohol dependence with uncomplicated withdrawal (HCC) 12/26/2020   Tobacco dependence 12/26/2020   Essential hypertension 12/26/2020   Sepsis associated hypotension (HCC) 08/03/2019   Intra-abdominal abscess (HCC) 08/03/2019   DM2 (diabetes mellitus, type 2) (HCC) 08/03/2019   History of DVT (deep vein thrombosis) 08/03/2019    Past Surgical History:  Procedure Laterality Date   COLOSTOMY     PACEMAKER LEADLESS INSERTION N/A 12/31/2020   Procedure: PACEMAKER LEADLESS INSERTION;  Surgeon: [name], MD;  Location: [facility] INVASIVE CV LAB;  Service: Cardiovascular;  Laterality: N/A;   TONSILLECTOMY         Family History  Problem Relation Age of Onset   COPD Mother    Prostate cancer Father     Social History   Tobacco Use   Smoking status: Every  Day    Packs/day: 1.00    Years: 44.00    Pack years: 44.00    Types: Cigarettes   Smokeless tobacco: Current  Vaping Use   Vaping Use: Never used  Substance Use Topics   Alcohol use: Yes    Alcohol/week: 5.0 standard drinks    Types: 5 Standard drinks or equivalent per week    Comment: Pt states drinks 5 pints of liquor weekly   Drug use: Yes    Types: Marijuana    Home Medications Prior to Admission medications   Medication Sig Start Date End Date Taking? Authorizing Provider  acetaminophen (TYLENOL) 650 MG CR tablet Take 650 mg by mouth every 8 (eight) hours as needed for pain.     [provider]  amLODipine (NORVASC) 10 MG tablet Take 10 mg by mouth daily.    [provider]  atorvastatin (LIPITOR) 10 MG tablet Take 10 mg by mouth daily. 11/20/20   [provider]  folic acid (FOLVITE) 1 MG tablet Take 1 tablet (1 mg total) by mouth daily. 01/01/21   Graciella Freer, PA-C  metFORMIN (GLUCOPHAGE) 1000 MG tablet Take 500 mg by mouth daily.     [provider]  metoprolol succinate (TOPROL-XL) 25 MG 24 hr tablet Take 25 mg by mouth daily. 12/20/20   [provider]  Multiple Vitamin (MULTIVITAMIN WITH MINERALS)  TABS tablet Take 1 tablet by mouth every other day.     [provider]  thiamine 100 MG tablet Take 1 tablet (100 mg total) by mouth daily. 01/01/21   Graciella Freer, PA-C  traMADol (ULTRAM) 50 MG tablet Take 1 tablet (50 mg total) by mouth every 6 (six) hours as needed for moderate pain. 08/04/19   Burnadette Pop, MD    Allergies    Codeine  Review of Systems   Review of Systems  Constitutional:  Negative for fever.  HENT:  Negative for ear pain and sore throat.   Eyes:  Negative for pain.  Respiratory:  Negative for cough.   Cardiovascular:  Negative for chest pain.  Gastrointestinal:  Negative for abdominal pain.  Genitourinary:  Negative for flank pain.  Musculoskeletal:  Negative for back  pain.  Skin:  Positive for wound. Negative for color change and rash.  Neurological:  Negative for syncope.  All other systems reviewed and are negative.  Physical Exam Updated Vital Signs BP (!) 157/80   Pulse 69   Temp 98.8 F (37.1 C) (Oral)   Resp 15   Ht 6\' 1"  (1.854 m)   Wt 89.4 kg   SpO2 100%   BMI 25.99 kg/m   Physical Exam Constitutional:      Appearance: He is well-developed.  HENT:     Head: Normocephalic.     Nose: Nose normal.  Eyes:     Extraocular Movements: Extraocular movements intact.  Cardiovascular:     Rate and Rhythm: Normal rate.  Pulmonary:     Effort: Pulmonary effort is normal.  Skin:    Coloration: Skin is not jaundiced.     Comments: Large mid and lower abdominal wall defect approximately 40 x 30 cm in size with granulating tissue seen.  Appears clean, well-healing.  No evidence of cellulitis or purulent discharge.  No active bleeding noted.  Neurological:     Mental Status: He is alert. Mental status is at baseline.    ED Results / Procedures / Treatments   Labs (all labs ordered are listed, but only abnormal results are displayed) Labs Reviewed - No data to display  EKG None  Radiology No results found.  Procedures Procedures   Medications Ordered in ED Medications - No data to display  ED Course  I have reviewed the triage vital signs and the nursing notes.  Pertinent labs & imaging results that were available during my care of the patient were reviewed by me and considered in my medical decision making (see chart for details).    MDM Rules/Calculators/A&P                           Abdominal wound bleeding site appears to have stopped.  Xeroform dressing placed and abdominal pads placed on top.  Advised outpatient follow-up with the surgeon or to follow-up in wound care clinic.  Advised return if he has fevers bleeding recurrent symptoms or any additional concerns.  Final Clinical Impression(s) / ED Diagnoses Final  diagnoses:  Visit for wound check    Rx / DC Orders ED Discharge Orders     None        , MD 02/15/21 1151

## 2021-02-18 ENCOUNTER — Encounter: Payer: 59 | Admitting: Student

## 2021-02-18 NOTE — Progress Notes (Deleted)
Electrophysiology Office Note Date: 02/18/2021  ID:  Luis [name], DOB 12/17/1960, MRN [id number]  PCP: Henrine Screws, MD Primary Cardiologist: None Electrophysiologist: [name], MD   CC: Pacemaker follow-up  Luis [name] is a 60 y.o. male seen today for [name], MD for routine electrophysiology followup.  Since last being seen in our clinic the patient reports doing ***.  he denies chest pain, palpitations, dyspnea, PND, orthopnea, nausea, vomiting, dizziness, syncope, edema, weight gain, or early satiety.  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  Past Medical History:  Diagnosis Date   Diabetes mellitus without complication (HCC)    Diverticulitis    DVT (deep venous thrombosis) (HCC)    ETOH abuse    H/O colectomy    Hyperlipidemia    Hypertension    Smoker    Past Surgical History:  Procedure Laterality Date   COLOSTOMY     PACEMAKER LEADLESS INSERTION N/A 12/31/2020   Procedure: PACEMAKER LEADLESS INSERTION;  Surgeon: [name], MD;  Location: [facility] INVASIVE CV LAB;  Service: Cardiovascular;  Laterality: N/A;   TONSILLECTOMY      Current Outpatient Medications  Medication Sig Dispense Refill   acetaminophen (TYLENOL) 650 MG CR tablet Take 650 mg by mouth every 8 (eight) hours as needed for pain.      amLODipine (NORVASC) 10 MG tablet Take 10 mg by mouth daily.     atorvastatin (LIPITOR) 10 MG tablet Take 10 mg by mouth daily.     folic acid (FOLVITE) 1 MG tablet Take 1 tablet (1 mg total) by mouth daily. 30 tablet 6   metFORMIN (GLUCOPHAGE) 1000 MG tablet Take 500 mg by mouth daily.      metoprolol succinate (TOPROL-XL) 25 MG 24 hr tablet Take 25 mg by mouth daily.     Multiple Vitamin (MULTIVITAMIN WITH MINERALS) TABS tablet Take 1 tablet by mouth every other day.      thiamine 100 MG tablet Take 1 tablet (100 mg total) by mouth daily. 30 tablet 6   traMADol (ULTRAM)  50 MG tablet Take 1 tablet (50 mg total) by mouth every 6 (six) hours as needed for moderate pain. 30 tablet 0   No current facility-administered medications for this visit.    Allergies:   Codeine   Social History: Social History   Socioeconomic History   Marital status: Single    Spouse name: Not on file   Number of children: Not on file   Years of education: Not on file   Highest education level: Not on file  Occupational History   Occupation: Pt states not able to work  Tobacco Use   Smoking status: Every Day    Packs/day: 1.00    Years: 44.00    Pack years: 44.00    Types: Cigarettes   Smokeless tobacco: Current  Vaping Use   Vaping Use: Never used  Substance and Sexual Activity   Alcohol use: Yes    Alcohol/week: 5.0 standard drinks    Types: 5 Standard drinks or equivalent per week    Comment: Pt states drinks 5 pints of liquor weekly   Drug use: Yes    Types: Marijuana   Sexual activity: Not Currently  Other Topics Concern   Not on file  Social History Narrative   Not on file   Social Determinants of Health   Financial Resource Strain: Not on file  Food Insecurity: Not on file  Transportation  Needs: Not on file  Physical Activity: Not on file  Stress: Not on file  Social Connections: Not on file  Intimate Partner Violence: Not on file    Family History: Family History  Problem Relation Age of Onset   COPD Mother    Prostate cancer Father      Review of Systems: All other systems reviewed and are otherwise negative except as noted above.  Physical Exam: There were no vitals filed for this visit.   GEN- The patient is well appearing, alert and oriented x 3 today.   HEENT: normocephalic, atraumatic; sclera clear, conjunctiva pink; hearing intact; oropharynx clear; neck supple  Lungs- Clear to ausculation bilaterally, normal work of breathing.  No wheezes, rales, rhonchi Heart- Regular rate and rhythm, no murmurs, rubs or gallops  GI- soft,  non-tender, non-distended, bowel sounds present  Extremities- no clubbing or cyanosis. No edema MS- no significant deformity or atrophy Skin- warm and dry, no rash or lesion; PPM pocket well healed Psych- euthymic mood, full affect Neuro- strength and sensation are intact  PPM Interrogation- reviewed in detail today,  See PACEART report  EKG:  EKG {ACTION; IS/IS TGG:[id number]} ordered today. Personal review of ekg ordered {Blank single:19197::"today","***"} shows ***   Recent Labs: 12/25/2020: ALT 29 12/26/2020: B Natriuretic Peptide 1,333.9 12/30/2020: Hemoglobin 11.1; Magnesium 1.6; Platelets 185 01/01/2021: BUN 22; Creatinine, Ser 0.85; Potassium 4.2; Sodium 135   Wt Readings from Last 3 Encounters:  02/15/21 197 lb (89.4 kg)  12/31/20 193 lb 11.2 oz (87.9 kg)  01/27/20 177 lb (80.3 kg)     Other studies Reviewed: Additional studies/ records that were reviewed today include: Previous EP office notes, Previous remote checks, Most recent labwork.   Assessment and Plan:  1. Symptomatic bradycardia s/p Medtronic PPM  Normal PPM function See Pace Art report No changes today   Current medicines are reviewed at length with the patient today.   The patient {ACTIONS; HAS/DOES NOT HAVE:19233} concerns regarding his medicines.  The following changes were made today:  {NONE DEFAULTED:18576}  Labs/ tests ordered today include: *** No orders of the defined types were placed in this encounter.    Disposition:   Follow up with {Blank single:19197::"Dr. Allred","Dr. Amada Jupiter. Klein","Dr. Camnitz","Dr. Lambert","EP APP"} in *** {Blank single:19197::"Months","Weeks"}    Signed, Graciella Freer, PA-C  02/18/2021 8:13 AM  [facility] [address] [place] [address] [phone] (office) [phone] (fax)

## 2021-02-25 NOTE — Progress Notes (Deleted)
Electrophysiology Office Note Date: 02/25/2021  ID:  Luis [name], DOB 03-26-1961, MRN [id number]  PCP: Henrine Screws, MD Primary Cardiologist: None Electrophysiologist: [name], MD   CC: Pacemaker follow-up  Luis [name] is a 60 y.o. male seen today for [name], MD for routine electrophysiology followup.  Since last being seen in our clinic the patient reports doing ***.  he denies chest pain, palpitations, dyspnea, PND, orthopnea, nausea, vomiting, dizziness, syncope, edema, weight gain, or early satiety.  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  Past Medical History:  Diagnosis Date   Diabetes mellitus without complication (HCC)    Diverticulitis    DVT (deep venous thrombosis) (HCC)    ETOH abuse    H/O colectomy    Hyperlipidemia    Hypertension    Smoker    Past Surgical History:  Procedure Laterality Date   COLOSTOMY     PACEMAKER LEADLESS INSERTION N/A 12/31/2020   Procedure: PACEMAKER LEADLESS INSERTION;  Surgeon: [name], MD;  Location: [facility];  Service: Cardiovascular;  Laterality: N/A;   TONSILLECTOMY      Current Outpatient Medications  Medication Sig Dispense Refill   acetaminophen (TYLENOL) 650 MG CR tablet Take 650 mg by mouth every 8 (eight) hours as needed for pain.      amLODipine (NORVASC) 10 MG tablet Take 10 mg by mouth daily.     atorvastatin (LIPITOR) 10 MG tablet Take 10 mg by mouth daily.     folic acid (FOLVITE) 1 MG tablet Take 1 tablet (1 mg total) by mouth daily. 30 tablet 6   metFORMIN (GLUCOPHAGE) 1000 MG tablet Take 500 mg by mouth daily.      metoprolol succinate (TOPROL-XL) 25 MG 24 hr tablet Take 25 mg by mouth daily.     Multiple Vitamin (MULTIVITAMIN WITH MINERALS) TABS tablet Take 1 tablet by mouth every other day.      thiamine 100 MG tablet Take 1 tablet (100 mg total) by mouth daily. 30 tablet 6   traMADol  (ULTRAM) 50 MG tablet Take 1 tablet (50 mg total) by mouth every 6 (six) hours as needed for moderate pain. 30 tablet 0   No current facility-administered medications for this visit.    Allergies:   Codeine   Social History: Social History   Socioeconomic History   Marital status: Single    Spouse name: Not on file   Number of children: Not on file   Years of education: Not on file   Highest education level: Not on file  Occupational History   Occupation: Pt states not able to work  Tobacco Use   Smoking status: Every Day    Packs/day: 1.00    Years: 44.00    Pack years: 44.00    Types: Cigarettes   Smokeless tobacco: Current  Vaping Use   Vaping Use: Never used  Substance and Sexual Activity   Alcohol use: Yes    Alcohol/week: 5.0 standard drinks    Types: 5 Standard drinks or equivalent per week    Comment: Pt states drinks 5 pints of liquor weekly   Drug use: Yes    Types: Marijuana   Sexual activity: Not Currently  Other Topics Concern   Not on file  Social History Narrative   Not on file   Social Determinants of Health   Financial Resource Strain: Not on file  Food Insecurity: Not on file  Transportation  Needs: Not on file  Physical Activity: Not on file  Stress: Not on file  Social Connections: Not on file  Intimate Partner Violence: Not on file    Family History: Family History  Problem Relation Age of Onset   COPD Mother    Prostate cancer Father      Review of Systems: All other systems reviewed and are otherwise negative except as noted above.  Physical Exam: There were no vitals filed for this visit.   GEN- The patient is well appearing, alert and oriented x 3 today.   HEENT: normocephalic, atraumatic; sclera clear, conjunctiva pink; hearing intact; oropharynx clear; neck supple  Lungs- Clear to ausculation bilaterally, normal work of breathing.  No wheezes, rales, rhonchi Heart- Regular rate and rhythm, no murmurs, rubs or gallops  GI-  soft, non-tender, non-distended, bowel sounds present  Extremities- no clubbing or cyanosis. No edema MS- no significant deformity or atrophy Skin- warm and dry, no rash or lesion; PPM pocket well healed Psych- euthymic mood, full affect Neuro- strength and sensation are intact  PPM Interrogation- reviewed in detail today,  See PACEART report  EKG:  EKG {ACTION; IS/IS [id number]} ordered today. Personal review of ekg ordered {Blank single:19197::"today","***"} shows ***   Recent Labs: 12/25/2020: ALT 29 12/26/2020: B Natriuretic Peptide 1,333.9 12/30/2020: Hemoglobin 11.1; Magnesium 1.6; Platelets 185 01/01/2021: BUN 22; Creatinine, Ser 0.85; Potassium 4.2; Sodium 135   Wt Readings from Last 3 Encounters:  02/15/21 197 lb (89.4 kg)  12/31/20 193 lb 11.2 oz (87.9 kg)  01/27/20 177 lb (80.3 kg)     Other studies Reviewed: Additional studies/ records that were reviewed today include: Previous EP office notes, Previous remote checks, Most recent labwork.   Assessment and Plan:  1. Symptomatic bradycardia s/p Medtronic PPM  Normal PPM function See Pace Art report No changes today   Current medicines are reviewed at length with the patient today.   The patient {ACTIONS; HAS/DOES NOT HAVE:19233} concerns regarding his medicines.  The following changes were made today:  {NONE DEFAULTED:18576}  Labs/ tests ordered today include: *** No orders of the defined types were placed in this encounter.    Disposition:   Follow up with {Blank single:19197::"Dr. Allred","Dr. Amada Jupiter. Klein","Dr. Camnitz","Dr. Lambert","EP APP"} in *** {Blank single:19197::"Months","Weeks"}    Signed, Graciella Freer, PA-C  02/25/2021 8:20 AM  [facility] [address] [place] [address] [phone] (office) [phone] (fax)

## 2021-02-26 ENCOUNTER — Encounter: Payer: 59 | Admitting: Student

## 2021-02-26 DIAGNOSIS — I442 Atrioventricular block, complete: Secondary | ICD-10-CM

## 2021-02-26 DIAGNOSIS — K651 Peritoneal abscess: Secondary | ICD-10-CM

## 2021-02-27 NOTE — Progress Notes (Signed)
Electrophysiology Office Note Date: 03/01/2021  ID:  Luis [name], DOB [birth year], MRN [id number]  PCP: Georgann Housekeeper, MD Primary Cardiologist: None Electrophysiologist: [name], MD   CC: Pacemaker follow-up  Luis [name] is a 60 y.o. male seen today for [name], MD for routine electrophysiology followup.  Since last being seen in our clinic the patient reports doing well. No further "spells" of breathlessness, syncope, or near syncope. He has had some issues with his abdominal wound, but overall feels like it is improving. .  he denies chest pain, palpitations, dyspnea, PND, orthopnea, nausea, vomiting, dizziness, syncope, edema, weight gain, or early satiety.  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  Past Medical History:  Diagnosis Date   Diabetes mellitus without complication (HCC)    Diverticulitis    DVT (deep venous thrombosis) (HCC)    ETOH abuse    H/O colectomy    Hyperlipidemia    Hypertension    Smoker    Past Surgical History:  Procedure Laterality Date   COLOSTOMY     PACEMAKER LEADLESS INSERTION N/A 12/31/2020   Procedure: PACEMAKER LEADLESS INSERTION;  Surgeon: [name], MD;  Location: [facility];  Service: Cardiovascular;  Laterality: N/A;   TONSILLECTOMY      Current Outpatient Medications  Medication Sig Dispense Refill   acetaminophen (TYLENOL) 650 MG CR tablet Take 650 mg by mouth every 8 (eight) hours as needed for pain.      amLODipine (NORVASC) 10 MG tablet Take 10 mg by mouth daily.     atorvastatin (LIPITOR) 10 MG tablet Take 10 mg by mouth daily.     folic acid (FOLVITE) 1 MG tablet Take 1 tablet (1 mg total) by mouth daily. 30 tablet 6   metFORMIN (GLUCOPHAGE) 1000 MG tablet Take 500 mg by mouth daily.      metoprolol succinate (TOPROL-XL) 50 MG 24 hr tablet Take 1 tablet (50 mg total) by mouth daily. 90 tablet 3   Multiple Vitamin  (MULTIVITAMIN WITH MINERALS) TABS tablet Take 1 tablet by mouth every other day.      thiamine 100 MG tablet Take 1 tablet (100 mg total) by mouth daily. 30 tablet 6   traMADol (ULTRAM) 50 MG tablet Take 1 tablet (50 mg total) by mouth every 6 (six) hours as needed for moderate pain. 30 tablet 0   No current facility-administered medications for this visit.    Allergies:   Codeine   Social History: Social History   Socioeconomic History   Marital status: Single    Spouse name: Not on file   Number of children: Not on file   Years of education: Not on file   Highest education level: Not on file  Occupational History   Occupation: Pt states not able to work  Tobacco Use   Smoking status: Every Day    Packs/day: 1.00    Years: 44.00    Pack years: 44.00    Types: Cigarettes   Smokeless tobacco: Current  Vaping Use   Vaping Use: Never used  Substance and Sexual Activity   Alcohol use: Yes    Alcohol/week: 5.0 standard drinks    Types: 5 Standard drinks or equivalent per week    Comment: Pt states drinks 5 pints of liquor weekly   Drug use: Yes    Types: Marijuana   Sexual activity: Not Currently  Other Topics Concern   Not on file  Social History Narrative   Not on file   Social Determinants of Health   Financial Resource Strain: Not on file  Food Insecurity: Not on file  Transportation Needs: Not on file  Physical Activity: Not on file  Stress: Not on file  Social Connections: Not on file  Intimate Partner Violence: Not on file    Family History: Family History  Problem Relation Age of Onset   COPD Mother    Prostate cancer Father      Review of Systems: All other systems reviewed and are otherwise negative except as noted above.  Physical Exam: Vitals:   03/01/21 1222  BP: (!) 174/84  Pulse: 63  SpO2: 97%  Weight: 202 lb 6.4 oz (91.8 kg)  Height: 6\' 1"  (1.854 m)     GEN- The patient is well appearing, alert and oriented x 3 today.   HEENT:  normocephalic, atraumatic; sclera clear, conjunctiva pink; hearing intact; oropharynx clear; neck supple  Lungs- Clear to ausculation bilaterally, normal work of breathing.  No wheezes, rales, rhonchi Heart- Regular rate and rhythm, no murmurs, rubs or gallops  GI- soft, non-tender, non-distended, bowel sounds present  Extremities- no clubbing or cyanosis. No edema MS- no significant deformity or atrophy Skin- warm and dry, no rash or lesion; PPM pocket well healed Psych- euthymic mood, full affect Neuro- strength and sensation are intact  PPM Interrogation- reviewed in detail today,  See PACEART report  EKG:  EKG is not ordered today.  Recent Labs: 12/25/2020: ALT 29 12/26/2020: B Natriuretic Peptide 1,333.9 12/30/2020: Hemoglobin 11.1; Magnesium 1.6; Platelets 185 01/01/2021: BUN 22; Creatinine, Ser 0.85; Potassium 4.2; Sodium 135   Wt Readings from Last 3 Encounters:  03/01/21 202 lb 6.4 oz (91.8 kg)  02/15/21 197 lb (89.4 kg)  12/31/20 193 lb 11.2 oz (87.9 kg)     Other studies Reviewed: Additional studies/ records that were reviewed today include: Previous EP office notes, Previous remote checks, Most recent labwork.   Assessment and Plan:  1. Symptomatic bradycardia s/p Medtronic MicraAV PPM  Normal PPM function See Pace Art report No changes today Troubleshooting performed with [employer] He has some ectopy which may also be preventing optimal AS-VP. Will increase Toprol  2. Chronic abdominal wounds [facility]. He states there are no plans for surgery at this time.  Note 8/22 per Dr. 9/22 state "Pt has very poor to no chance of healing a major surgery..."  3. HTN Increase toprol  Current medicines are reviewed at length with the patient today.    Labs/ tests ordered today include:  Orders Placed This Encounter  Procedures   CUP PACEART INCLINIC DEVICE CHECK    Disposition:   Follow up with Dr. Ronda Fairly for post [facility] implant as  scheduled.   [facility], PA-C  03/01/2021 1:34 PM  [facility] [address] [place] [phone] (office) [phone] (fax)

## 2021-03-01 ENCOUNTER — Ambulatory Visit (INDEPENDENT_AMBULATORY_CARE_PROVIDER_SITE_OTHER): Payer: 59 | Admitting: Student

## 2021-03-01 ENCOUNTER — Other Ambulatory Visit: Payer: Self-pay

## 2021-03-01 ENCOUNTER — Encounter: Payer: Self-pay | Admitting: Student

## 2021-03-01 VITALS — BP 174/84 | HR 63 | Ht 73.0 in | Wt 202.4 lb

## 2021-03-01 DIAGNOSIS — I442 Atrioventricular block, complete: Secondary | ICD-10-CM | POA: Diagnosis not present

## 2021-03-01 DIAGNOSIS — K651 Peritoneal abscess: Secondary | ICD-10-CM

## 2021-03-01 LAB — CUP PACEART INCLINIC DEVICE CHECK
Date Time Interrogation Session: 20220919132625
Implantable Pulse Generator Implant Date: 20220721

## 2021-03-01 MED ORDER — METOPROLOL SUCCINATE ER 50 MG PO TB24: 50.0000 mg | ORAL_TABLET | Freq: Every day | ORAL | 3 refills | Status: DC

## 2021-03-01 NOTE — Patient Instructions (Addendum)
Medication Instructions:  Your physician has recommended you make the following change in your medication:   INCREASE: Metoprolol to 50mg  daily  *If you need a refill on your cardiac medications before your next appointment, please call your pharmacy*   Lab Work: None If you have labs (blood work) drawn today and your tests are completely normal, you will receive your results only by: MyChart Message (if you have MyChart) OR A paper copy in the mail If you have any lab test that is abnormal or we need to change your treatment, we will call you to review the results.   Follow-Up: At [facility], you and your health needs are our priority.  As part of our continuing mission to provide you with exceptional heart care, we have created designated Provider Care Teams.  These Care Teams include your primary Cardiologist (physician) and Advanced Practice Providers (APPs -  Physician Assistants and Nurse Practitioners) who all work together to provide you with the care you need, when you need it.  We recommend signing up for the patient portal called "MyChart".  Sign up information is provided on this After Visit Summary.  MyChart is used to connect with patients for Virtual Visits (Telemedicine).  Patients are able to view lab/test results, encounter notes, upcoming appointments, etc.  Non-urgent messages can be sent to your provider as well.   To learn more about what you can do with MyChart, go to [facility].    Your next appointment:   04/08/2021

## 2021-04-01 ENCOUNTER — Emergency Department (HOSPITAL_COMMUNITY)
Admission: EM | Admit: 2021-04-01 | Discharge: 2021-04-01 | Disposition: A | Discharge status: Home / Self Care | Payer: 59 | Attending: Emergency Medicine | Admitting: Emergency Medicine

## 2021-04-01 ENCOUNTER — Other Ambulatory Visit: Payer: Self-pay

## 2021-04-01 ENCOUNTER — Emergency Department (HOSPITAL_COMMUNITY): Payer: 59

## 2021-04-01 ENCOUNTER — Encounter (HOSPITAL_COMMUNITY): Payer: Self-pay | Admitting: Emergency Medicine

## 2021-04-01 DIAGNOSIS — K439 Ventral hernia without obstruction or gangrene: Secondary | ICD-10-CM | POA: Insufficient documentation

## 2021-04-01 DIAGNOSIS — Z7984 Long term (current) use of oral hypoglycemic drugs: Secondary | ICD-10-CM | POA: Insufficient documentation

## 2021-04-01 DIAGNOSIS — Y908 Blood alcohol level of 240 mg/100 ml or more: Secondary | ICD-10-CM | POA: Insufficient documentation

## 2021-04-01 DIAGNOSIS — Z79899 Other long term (current) drug therapy: Secondary | ICD-10-CM | POA: Insufficient documentation

## 2021-04-01 DIAGNOSIS — F10120 Alcohol abuse with intoxication, uncomplicated: Secondary | ICD-10-CM | POA: Insufficient documentation

## 2021-04-01 DIAGNOSIS — F17210 Nicotine dependence, cigarettes, uncomplicated: Secondary | ICD-10-CM | POA: Insufficient documentation

## 2021-04-01 DIAGNOSIS — L7622 Postprocedural hemorrhage and hematoma of skin and subcutaneous tissue following other procedure: Secondary | ICD-10-CM | POA: Diagnosis present

## 2021-04-01 DIAGNOSIS — F10920 Alcohol use, unspecified with intoxication, uncomplicated: Secondary | ICD-10-CM

## 2021-04-01 DIAGNOSIS — I10 Essential (primary) hypertension: Secondary | ICD-10-CM | POA: Diagnosis not present

## 2021-04-01 DIAGNOSIS — Z950 Presence of cardiac pacemaker: Secondary | ICD-10-CM | POA: Insufficient documentation

## 2021-04-01 DIAGNOSIS — T148XXA Other injury of unspecified body region, initial encounter: Secondary | ICD-10-CM

## 2021-04-01 DIAGNOSIS — E119 Type 2 diabetes mellitus without complications: Secondary | ICD-10-CM | POA: Diagnosis not present

## 2021-04-01 LAB — CBC WITH DIFFERENTIAL/PLATELET
Abs Immature Granulocytes: 0.03 10*3/uL (ref 0.00–0.07)
Basophils Absolute: 0.1 10*3/uL (ref 0.0–0.1)
Basophils Relative: 1 %
Eosinophils Absolute: 0.1 10*3/uL (ref 0.0–0.5)
Eosinophils Relative: 1 %
HCT: 40.1 % (ref 39.0–52.0)
Hemoglobin: 13.6 g/dL (ref 13.0–17.0)
Immature Granulocytes: 0 %
Lymphocytes Relative: 19 %
Lymphs Abs: 2 10*3/uL (ref 0.7–4.0)
MCH: 35.6 pg — ABNORMAL HIGH (ref 26.0–34.0)
MCHC: 33.9 g/dL (ref 30.0–36.0)
MCV: 105 fL — ABNORMAL HIGH (ref 80.0–100.0)
Monocytes Absolute: 1.3 10*3/uL — ABNORMAL HIGH (ref 0.1–1.0)
Monocytes Relative: 12 %
Neutro Abs: 6.9 10*3/uL (ref 1.7–7.7)
Neutrophils Relative %: 67 %
Platelets: 260 10*3/uL (ref 150–400)
RBC: 3.82 MIL/uL — ABNORMAL LOW (ref 4.22–5.81)
RDW: 13.1 % (ref 11.5–15.5)
WBC: 10.4 10*3/uL (ref 4.0–10.5)
nRBC: 0 % (ref 0.0–0.2)

## 2021-04-01 LAB — COMPREHENSIVE METABOLIC PANEL
ALT: 33 U/L (ref 0–44)
AST: 58 U/L — ABNORMAL HIGH (ref 15–41)
Albumin: 3.6 g/dL (ref 3.5–5.0)
Alkaline Phosphatase: 98 U/L (ref 38–126)
Anion gap: 11 (ref 5–15)
BUN: 17 mg/dL (ref 8–23)
CO2: 25 mmol/L (ref 22–32)
Calcium: 9.7 mg/dL (ref 8.9–10.3)
Chloride: 102 mmol/L (ref 98–111)
Creatinine, Ser: 0.86 mg/dL (ref 0.61–1.24)
GFR, Estimated: >60 mL/min (ref >60)
Glucose, Bld: 105 mg/dL — ABNORMAL HIGH (ref 70–99)
Potassium: 4.6 mmol/L (ref 3.5–5.1)
Sodium: 138 mmol/L (ref 135–145)
Total Bilirubin: 0.7 mg/dL (ref 0.3–1.2)
Total Protein: 7.6 g/dL (ref 6.5–8.1)

## 2021-04-01 LAB — SAMPLE TO BLOOD BANK

## 2021-04-01 LAB — PROTIME-INR
INR: 0.9 (ref 0.8–1.2)
Prothrombin Time: 12.4 seconds (ref 11.4–15.2)

## 2021-04-01 LAB — ETHANOL: Alcohol, Ethyl (B): 298 mg/dL — ABNORMAL HIGH (ref <10)

## 2021-04-01 MED ORDER — IOHEXOL 300 MG/ML  SOLN
100.0000 mL | Freq: Once | INTRAMUSCULAR | Status: AC | PRN
  Administered 2021-04-01: 100 mL via INTRAVENOUS

## 2021-04-01 NOTE — Procedures (Signed)
At the bedside in trauma bay C the chronic wound was prepped and draped and two 3-0 nylon sutures were placed in figure 8 fashion to control the venous bleeding from the exposed vein at the base of the wound.  This controlled the bleeding.  Snow topical hemostatic was applied and a bandage.  The patient tolerated this well.  Quentin Ore, MD

## 2021-04-01 NOTE — ED Provider Notes (Signed)
[facility] EMERGENCY DEPARTMENT Provider Note   CSN: [id number] Arrival date & time: 04/01/21  1216     History Chief Complaint  Patient presents with   Abdominal Injury    Luis [name] is a 60 y.o. male.  HPI  Patient presented the emergency room for evaluation of abdominal wound bleeding.  Patient has a complex history of a chronic abdominal wound infection and wound dehiscence associated with bowel surgery and colostomy.  Surgical intervention and possible skin grafting was recommended however the patient chronically smokes and drinks alcohol and was not felt to be a good surgical candidate.  Patient is at home in his usual health, he woke up this morning noticing large amount of blood coming from his abdominal wound.  He denies any recent falls or injuries.  He denies any pain or fevers.  Patient was drinking alcohol this morning.  EMS noted several 100 cc of blood.  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   Chronic abdominal wound infection, subsequent encounter 12/26/2020   Alcohol dependence with uncomplicated withdrawal (HCC) 12/26/2020   Tobacco dependence 12/26/2020   Essential hypertension 12/26/2020   Sepsis associated hypotension (HCC) 08/03/2019   Intra-abdominal abscess (HCC) 08/03/2019   DM2 (diabetes mellitus, type 2) (HCC) 08/03/2019   History of DVT (deep vein thrombosis) 08/03/2019    Past Surgical History:  Procedure Laterality Date   COLOSTOMY     PACEMAKER LEADLESS INSERTION N/A 12/31/2020   Procedure: PACEMAKER LEADLESS INSERTION;  Surgeon: [name], MD;  Location: [facility];  Service: Cardiovascular;  Laterality: N/A;    TONSILLECTOMY         Family History  Problem Relation Age of Onset   COPD Mother    Prostate cancer Father     Social History   Tobacco Use   Smoking status: Every Day    Packs/day: 1.00    Years: 44.00    Pack years: 44.00    Types: Cigarettes   Smokeless tobacco: Current  Vaping Use   Vaping Use: Never used  Substance Use Topics   Alcohol use: Yes    Comment: Pt states drinks 7 pints of liquor weekly   Drug use: Yes    Types: Marijuana    Home Medications Prior to Admission medications   Medication Sig Start Date End Date Taking? Authorizing Provider  acetaminophen (TYLENOL) 650 MG CR tablet Take 650 mg by mouth every 8 (eight) hours as needed for pain.     [provider]  amLODipine (NORVASC) 10 MG tablet Take 10 mg by mouth daily.    [provider]  atorvastatin (LIPITOR) 10 MG tablet Take 10 mg by mouth daily. 11/20/20   [provider]  folic acid (FOLVITE) 1 MG tablet Take 1 tablet (1 mg total) by mouth daily. 01/01/21   Graciella Freer, PA-C  metFORMIN (GLUCOPHAGE) 1000 MG tablet Take 500 mg by mouth daily.     [provider]  metoprolol succinate (TOPROL-XL) 50 MG 24 hr tablet Take 1 tablet (50 mg total) by mouth daily. 03/01/21   Graciella Freer, PA-C  Multiple Vitamin (MULTIVITAMIN WITH MINERALS) TABS tablet Take 1 tablet by mouth every other day.     [provider]  thiamine 100 MG tablet Take 1 tablet (100 mg total) by mouth daily. 01/01/21   Graciella Freer, PA-C  traMADol (ULTRAM) 50 MG tablet Take 1 tablet (50 mg total) by mouth every 6 (six) hours as needed for moderate pain. 08/04/19   Burnadette Pop, MD    Allergies    Codeine  Review of Systems   Review of Systems  All other systems reviewed and are negative.  Physical Exam Updated Vital Signs BP (!) 141/90   Pulse 96   Resp 13   SpO2 97%   Physical Exam Vitals and nursing note reviewed.  Constitutional:      General:  He is not in acute distress.    Appearance: He is well-developed.  HENT:     Head: Normocephalic and atraumatic.     Right Ear: External ear normal.     Left Ear: External ear normal.  Eyes:     General: No scleral icterus.       Right eye: No discharge.        Left eye: No discharge.     Conjunctiva/sclera: Conjunctivae normal.  Neck:     Trachea: No tracheal deviation.  Cardiovascular:     Rate and Rhythm: Normal rate and regular rhythm.  Pulmonary:     Effort: Pulmonary effort is normal. No respiratory distress.     Breath sounds: Normal breath sounds. No stridor. No wheezing or rales.  Abdominal:     General: Bowel sounds are normal. There is no distension.     Palpations: Abdomen is soft.     Tenderness: There is no abdominal tenderness. There is no guarding or rebound.     Hernia: A hernia is present.     Comments: Large ventral hernia with chronic abdominal wound, pink appearing granulation tissue, no purulent drainage, small to 3 mm wound with brisk constant bleeding within the wound, no pulsatile flow, appears to be a venous source, tissue is very thin in this area  Musculoskeletal:        General: No tenderness or deformity.     Cervical back: Neck supple.  Skin:    General: Skin is warm and dry.     Findings: No rash.  Neurological:     General: No focal deficit present.     Mental Status: He is alert.     Cranial Nerves: No cranial nerve deficit (no facial droop, extraocular movements intact, no slurred speech).     Sensory: No sensory deficit.     Motor: No abnormal muscle tone or seizure activity.     Coordination: Coordination normal.  Psychiatric:        Mood and Affect: Mood normal.    ED Results / Procedures / Treatments   Labs (all labs ordered are listed, but only abnormal results are displayed) Labs Reviewed  CBC WITH DIFFERENTIAL/PLATELET - Abnormal; Notable for the following components:      Result Value   RBC 3.82 (*)    MCV 105.0 (*)    MCH  35.6 (*)    Monocytes Absolute 1.3 (*)    All other components within normal limits  COMPREHENSIVE METABOLIC PANEL - Abnormal; Notable for the following components:   Glucose, Bld 105 (*)    AST 58 (*)    All other components within normal limits  ETHANOL - Abnormal; Notable for the following  components:   Alcohol, Ethyl (B) 298 (*)    All other components within normal limits  PROTIME-INR  SAMPLE TO BLOOD BANK    EKG None  Radiology No results found.  Procedures Procedures   Medications Ordered in ED Medications - No data to display  ED Course  I have reviewed the triage vital signs and the nursing notes.  Pertinent labs & imaging results that were available during my care of the patient were reviewed by me and considered in my medical decision making (see chart for details).  Clinical Course as of 04/01/21 1519  Thu Apr 01, 2021  1316 CBC and metabolic panel are normal.  Alcohol elevated at 298.  No signs of significant blood loss [JK]  1435 Pt evaluated by Gen surgery team.  Vessel sutured by Dr Dossie Der [JK]  [phone] Reviewed case with general surgery.  Patient [JK]    Clinical Course User Index [JK] Linwood Dibbles, MD   MDM Rules/Calculators/A&P                           Patient has a complex medical history including a chronic abdominal wound.  Previous records reviewed.  Patient has been seen by general surgery, plastic surgery and has also been referred to tertiary care centers.  Surgery was considered at one point but the patient does continue to drink heavily and smoke and was not felt to be a good surgical candidate.  Unable to get the bleeding to stop with direct pressure however soon as we remove the dressing the bleeding restarts.  I suspect skin erosion into a vein within this abdominal wound causing his bleeding.  The tissue is very thin in this area.  I am concerned that suturing may violate the peritoneum or injure bowel.  I will consult with general  surgery.  Patient was seen by general surgery and his bleeding was controlled.  CT scan was ordered for preoperative evaluation.  Spoke with general surgery team and the patient does not need to wait here for results.  They will follow up on them as an outpatient Final Clinical Impression(s) / ED Diagnoses Final diagnoses:  Bleeding from wound  Alcoholic intoxication without complication [facility])    Rx / DC Orders ED Discharge Orders     None        Linwood Dibbles, MD 04/01/21 [phone]

## 2021-04-01 NOTE — Progress Notes (Signed)
CSW spoke with patient who stated he did not need any substance abuse resources. Patient stated that he is going to have surgery in a month and a half and will have to quit drinking and smoking. Patient stated that will not be a problem for him. Patient stated he cannot go to an inpatient facility due to all his medical issues. CSW left the resource packet for patient in case he changed his mind.

## 2021-04-01 NOTE — Consult Note (Signed)
Luis [name] 02-07-1961  [id number].    Requesting MD: Dr. Linwood Dibbles Chief Complaint/Reason for Consult: bleeding abdominal wall wound  HPI:  This is a 60 yo male with a history of COPD, complete heart block who underwent pacemaker placement as he was unable to get an AICD due to chronic abdominal wall wound, history of a TAC with ileostomy in 2020 in [place], [place] (for diverticulitis), DM, DVT(history of eliquis use now off), ETOH abuse (1 pint/day), HTN, and tobacco abuse.  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  He has undergone a skin graft in 2021, I think by Dr. Arita Miss.  He has seen Dr. [name] in the past as well but has never been in a place where he is healthy enough and willing to cease smoking, ETOH, etc to have surgery.    The patient presented to the hospital about 1 month ago with some bleeding from this wound that was corrected in the ED and he was discharged home.  He states he slept well last night and then woke up this morning and felt some warm liquid down his leg.  He thought his ileostomy has leaked.  He looked down and he had blood coming from his abdominal wound.  He called EMS and he was brought to the ED.  His labs are currently stable, but his ETOH is 298.  We have been called to see him to help with this bleeding.  ROS: ROS: Please see HPI, otherwise all other systems are currently negative at this time.  Family History  Problem Relation Age of Onset   COPD Mother    Prostate cancer Father     Past Medical History:  Diagnosis Date   Diabetes mellitus without complication (HCC)    Diverticulitis    DVT (deep venous thrombosis) (HCC)    ETOH abuse    H/O colectomy    Hyperlipidemia    Hypertension    Smoker     Past Surgical History:  Procedure Laterality Date   COLOSTOMY     PACEMAKER LEADLESS INSERTION N/A 12/31/2020   Procedure: PACEMAKER  LEADLESS INSERTION;  Surgeon: [name], MD;  [facility];  Service: Cardiovascular;  Laterality: N/A;   TONSILLECTOMY      Social History:  reports that he has been smoking cigarettes. He has a 44.00 pack-year smoking history. He uses smokeless tobacco. He reports current alcohol use. He reports current drug use. Drug: Marijuana.  Allergies:  Allergies  Allergen Reactions   Codeine Other (See Comments)    unknown    (Not in a hospital admission)    Physical Exam: Blood pressure (!) 141/90, pulse 96, resp. rate 13, SpO2 97 %. General: WD, WN white male who is laying in bed in NAD HEENT: head is normocephalic, atraumatic.  Sclera are noninjected.  PERRL.  Ears and nose without any masses or lesions.  Mouth is pink and moist Heart: regular, rate, and rhythm.  Normal s1,s2. No obvious murmurs, gallops, or rubs noted.  Palpable radial and pedal pulses bilaterally Lungs: CTAB, no wheezes, rhonchi, or rales noted.  Respiratory effort nonlabored Abd: soft, NT, ND, +BS, no masses or organomegaly.  He has a chronic abdominal wall wound along with a massive ventral hernia with loss of domain.  He has a small area of venous bleeding from a superficial vein.  This was stitched with 2 figure of  8 stitches with cessation of bleeding.  A piece of snow was also placed over this prior to gauze and tape.  Petroleum gauze was placed over the remaining raw skin areas of the wound.  Dry gauze and tape were placed over this.  He has a RLQ ileostomy with a viable stoma and appropriate feculent output. MS: all 4 extremities are symmetrical with no cyanosis, clubbing, or edema. Skin: warm and dry with no masses, lesions, or rashes Neuro: Cranial nerves 2-12 grossly intact, sensation is normal throughout Psych: A&Ox3 but drunk and asks repetitive questions and makes repetitive statements at time.   Results for orders placed or performed during the hospital encounter of 04/01/21 (from  the past 48 hour(s))  CBC with Differential/Platelet     Status: Abnormal   Collection Time: 04/01/21 12:27 PM  Result Value Ref Range   WBC 10.4 4.0 - 10.5 K/uL   RBC 3.82 (L) 4.22 - 5.81 MIL/uL   Hemoglobin 13.6 13.0 - 17.0 g/dL   HCT 24.5 80.9 - 98.3 %   MCV 105.0 (H) 80.0 - 100.0 fL   MCH 35.6 (H) 26.0 - 34.0 pg   MCHC 33.9 30.0 - 36.0 g/dL   RDW 38.2 50.5 - 39.7 %   Platelets 260 150 - 400 K/uL   nRBC 0.0 0.0 - 0.2 %   Neutrophils Relative % 67 %   Neutro Abs 6.9 1.7 - 7.7 K/uL   Lymphocytes Relative 19 %   Lymphs Abs 2.0 0.7 - 4.0 K/uL   Monocytes Relative 12 %   Monocytes Absolute 1.3 (H) 0.1 - 1.0 K/uL   Eosinophils Relative 1 %   Eosinophils Absolute 0.1 0.0 - 0.5 K/uL   Basophils Relative 1 %   Basophils Absolute 0.1 0.0 - 0.1 K/uL   Immature Granulocytes 0 %   Abs Immature Granulocytes 0.03 0.00 - 0.07 K/uL    Comment: Performed at [facility], [address] [place], [place] [address]  Comprehensive metabolic panel     Status: Abnormal   Collection Time: 04/01/21 12:27 PM  Result Value Ref Range   Sodium 138 135 - 145 mmol/L   Potassium 4.6 3.5 - 5.1 mmol/L   Chloride 102 98 - 111 mmol/L   CO2 25 22 - 32 mmol/L   Glucose, Bld 105 (H) 70 - 99 mg/dL    Comment: Glucose reference range applies only to samples taken after fasting for at least 8 hours.   BUN 17 8 - 23 mg/dL   Creatinine, Ser 9.37 0.61 - 1.24 mg/dL   Calcium 9.7 8.9 - 90.2 mg/dL   Total Protein 7.6 6.5 - 8.1 g/dL   Albumin 3.6 3.5 - 5.0 g/dL   AST 58 (H) 15 - 41 U/L   ALT 33 0 - 44 U/L   Alkaline Phosphatase 98 38 - 126 U/L   Total Bilirubin 0.7 0.3 - 1.2 mg/dL   GFR, Estimated >40 >97 mL/min    Comment: (NOTE) Calculated using the CKD-EPI Creatinine Equation (2021)    Anion gap 11 5 - 15    Comment: Performed at [facility], [address], [place], [place] [address]  Protime-INR     Status: None   Collection Time: 04/01/21 12:27 PM  Result Value Ref Range   Prothrombin Time  12.4 11.4 - 15.2 seconds   INR 0.9 0.8 - 1.2    Comment: (NOTE) INR goal varies based on device and disease states. Performed at [facility]  Lab, [address], [place], [place] [address]   Sample to Blood Bank     Status: None   Collection Time: 04/01/21 12:27 PM  Result Value Ref Range   Blood Bank Specimen SAMPLE AVAILABLE FOR TESTING    Sample Expiration      04/02/2021,2359 Performed at [facility], [address] [place], [place] [address]   Ethanol     Status: Abnormal   Collection Time: 04/01/21 12:27 PM  Result Value Ref Range   Alcohol, Ethyl (B) 298 (H) <10 mg/dL    Comment: (NOTE) Lowest detectable limit for serum alcohol is 10 mg/dL.  For medical purposes only. Performed at [facility], [address], [place], [place] [address]    No results found.    Assessment/Plan Chronic abdominal wound with ventral hernia and loss of domain with bleeding venous from ulcerated area, H/O TAC with ileostomy with chronic wound infection and eventual skin graft The patient had this bleeding controlled with 2 figure of 8 sutures with 3-0 nylon.  We then placed SNOW over this as well.  Bleeding has since stopped.  Discussion was had with the patient by Dr. Dossie Der about follow up to discuss possible ileostomy reversal as well as complex hernia repair.  He states he will stop smoking and drinking on his own and refuses resources.  Social work has seen the patient and he refused their assistance.  He was informed that he would need to stop these prior to surgery.  He will follow up with Dr. Dossie Der in 2 weeks.  He will need cardiac clearance evaluation as well as possible pulmonary clearance.  He will get a CT scan prior to discharge for possible surgical planning.  He is otherwise stable for DC home once this is complete.  ETOH abuse Tobacco abuse HTN DM H/O heart block with pacemaker H/O DVT, previously on eliquis, no longer HLD   Letha Cape,  [facility] 04/01/2021, 2:23 PM Please see Amion for pager number during day hours 7:00am-4:30pm or 7:00am -11:30am on weekends

## 2021-04-01 NOTE — ED Triage Notes (Signed)
Patient BIB [facility] from home, complaint of abdominal wound that is "squirting blood". Pt A&Ox4 upon arrival VSS.

## 2021-04-05 ENCOUNTER — Ambulatory Visit (INDEPENDENT_AMBULATORY_CARE_PROVIDER_SITE_OTHER): Payer: 59

## 2021-04-05 DIAGNOSIS — I442 Atrioventricular block, complete: Secondary | ICD-10-CM

## 2021-04-06 LAB — CUP PACEART REMOTE DEVICE CHECK
Battery Remaining Longevity: 96 mo
Battery Voltage: 3.09 V
Brady Statistic AS VP Percent: 80.29 %
Brady Statistic AS VS Percent: 0.01 %
Brady Statistic RV Percent Paced: 99.92 %
Date Time Interrogation Session: 20221024161456
Implantable Pulse Generator Implant Date: 20220721
Lead Channel Impedance Value: 610 Ohm
Lead Channel Pacing Threshold Amplitude: 0.375 V
Lead Channel Pacing Threshold Pulse Width: 0.24 ms
Lead Channel Sensing Intrinsic Amplitude: 19.688 mV
Lead Channel Setting Pacing Amplitude: 1.875
Lead Channel Setting Pacing Pulse Width: 0.24 ms
Lead Channel Setting Sensing Sensitivity: 2 mV

## 2021-04-08 ENCOUNTER — Encounter: Payer: 59 | Admitting: Cardiology

## 2021-04-13 NOTE — Progress Notes (Signed)
Remote pacemaker transmission.   

## 2021-04-14 ENCOUNTER — Encounter (HOSPITAL_BASED_OUTPATIENT_CLINIC_OR_DEPARTMENT_OTHER): Payer: 59 | Attending: Internal Medicine | Admitting: Internal Medicine

## 2021-04-15 ENCOUNTER — Encounter: Payer: 59 | Admitting: Cardiology

## 2021-04-15 NOTE — Progress Notes (Deleted)
Electrophysiology Office Follow up Visit Note:    Date:  04/15/2021   ID:  Luis [name], DOB 08-10-1960, MRN [id number]  PCP:  Georgann Housekeeper, MD  [facility] Cardiologist:  None  [facility] Electrophysiologist:  [name], MD    Interval History:    Luis [name] is a 60 y.o. male who presents for a follow up visit. He underwent successful leadless PPM implant 12/31/2020.  Device checks after implant suggests stable device function.       Past Medical History:  Diagnosis Date   Diabetes mellitus without complication (HCC)    Diverticulitis    DVT (deep venous thrombosis) (HCC)    ETOH abuse    H/O colectomy    Hyperlipidemia    Hypertension    Smoker     Past Surgical History:  Procedure Laterality Date   COLOSTOMY     PACEMAKER LEADLESS INSERTION N/A 12/31/2020   Procedure: PACEMAKER LEADLESS INSERTION;  Surgeon: [name], MD;  [facility];  Service: Cardiovascular;  Laterality: N/A;   TONSILLECTOMY      Current Medications: No outpatient medications have been marked as taking for the 04/15/21 encounter (Appointment) with [name], MD.     Allergies:   Codeine   Social History   Socioeconomic History   Marital status: Single    Spouse name: Not on file   Number of children: Not on file   Years of education: Not on file   Highest education level: Not on file  Occupational History   Occupation: Pt states not able to work  Tobacco Use   Smoking status: Every Day    Packs/day: 1.00    Years: 44.00    Pack years: 44.00    Types: Cigarettes   Smokeless tobacco: Current  Vaping Use   Vaping Use: Never used  Substance and Sexual Activity   Alcohol use: Yes    Comment: Pt states drinks 7 pints of liquor weekly   Drug use: Yes    Types: Marijuana   Sexual activity: Not Currently  Other Topics Concern   Not on file  Social History Narrative   Not on file   Social Determinants of Health   Financial  Resource Strain: Not on file  Food Insecurity: Not on file  Transportation Needs: Not on file  Physical Activity: Not on file  Stress: Not on file  Social Connections: Not on file     Family History: The patient's family history includes COPD in his mother; Prostate cancer in his father.  ROS:   Please see the history of present illness.    All other systems reviewed and are negative.  EKGs/Labs/Other Studies Reviewed:    The following studies were reviewed today: ***  EKG:  The ekg ordered today demonstrates ***  Recent Labs: 12/26/2020: B Natriuretic Peptide 1,333.9 12/30/2020: Magnesium 1.6 04/01/2021: ALT 33; BUN 17; Creatinine, Ser 0.86; Hemoglobin 13.6; Platelets 260; Potassium 4.6; Sodium 138  Recent Lipid Panel    Component Value Date/Time   CHOL 140 12/26/2020 0144   TRIG 196 (H) 12/26/2020 0144   HDL 48 12/26/2020 0144   CHOLHDL 2.9 12/26/2020 0144   VLDL 39 12/26/2020 0144   LDLCALC 53 12/26/2020 0144    Physical Exam:    VS:  There were no vitals taken for this visit.    Wt Readings from Last 3 Encounters:  03/01/21 202 lb 6.4 oz (91.8 kg)  02/15/21 197 lb (89.4 kg)  12/31/20 193  lb 11.2 oz (87.9 kg)     GEN: *** Well nourished, well developed in no acute distress HEENT: Normal NECK: No JVD; No carotid bruits LYMPHATICS: No lymphadenopathy CARDIAC: ***RRR, no murmurs, rubs, gallops RESPIRATORY:  Clear to auscultation without rales, wheezing or rhonchi  ABDOMEN: Soft, non-tender, non-distended MUSCULOSKELETAL:  No edema; No deformity  SKIN: Warm and dry NEUROLOGIC:  Alert and oriented x 3 PSYCHIATRIC:  Normal affect        ASSESSMENT:    1. 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 Presence of leadless cardiac pacemaker    PLAN:    In order of problems listed above:           Total time spent with patient today *** minutes. This includes reviewing records, evaluating the patient and coordinating care.   Medication Adjustments/Labs and  Tests Ordered: Current medicines are reviewed at length with the patient today.  Concerns regarding medicines are outlined above.  No orders of the defined types were placed in this encounter.  No orders of the defined types were placed in this encounter.    Signed, Steffanie Dunn, MD, [facility], [facility] 04/15/2021 6:58 AM    Electrophysiology  [facility]

## 2021-05-05 ENCOUNTER — Encounter: Payer: 59 | Admitting: Cardiology

## 2021-05-14 ENCOUNTER — Other Ambulatory Visit: Payer: Self-pay

## 2021-05-14 ENCOUNTER — Ambulatory Visit (INDEPENDENT_AMBULATORY_CARE_PROVIDER_SITE_OTHER): Payer: 59 | Admitting: Physician Assistant

## 2021-05-14 ENCOUNTER — Encounter: Payer: Self-pay | Admitting: Physician Assistant

## 2021-05-14 ENCOUNTER — Encounter: Payer: Self-pay | Admitting: *Deleted

## 2021-05-14 VITALS — HR 64 | Ht 72.0 in | Wt 208.0 lb

## 2021-05-14 DIAGNOSIS — I10 Essential (primary) hypertension: Secondary | ICD-10-CM | POA: Diagnosis not present

## 2021-05-14 DIAGNOSIS — Z950 Presence of cardiac pacemaker: Secondary | ICD-10-CM

## 2021-05-14 DIAGNOSIS — R9431 Abnormal electrocardiogram [ECG] [EKG]: Secondary | ICD-10-CM | POA: Diagnosis not present

## 2021-05-14 LAB — CUP PACEART INCLINIC DEVICE CHECK
Battery Remaining Longevity: 96 mo
Battery Voltage: 3.06 V
Brady Statistic AS VP Percent: 80.43 %
Brady Statistic AS VS Percent: 0.05 %
Brady Statistic RV Percent Paced: 99.65 %
Date Time Interrogation Session: 20221202131935
Implantable Pulse Generator Implant Date: 20220721
Lead Channel Impedance Value: 630 Ohm
Lead Channel Pacing Threshold Amplitude: 0.375 V
Lead Channel Pacing Threshold Pulse Width: 0.24 ms
Lead Channel Sensing Intrinsic Amplitude: 20.25 mV
Lead Channel Setting Pacing Amplitude: 0.875
Lead Channel Setting Pacing Pulse Width: 0.24 ms
Lead Channel Setting Sensing Sensitivity: 2 mV

## 2021-05-14 NOTE — Patient Instructions (Signed)
Medication Instructions:   Your physician recommends that you continue on your current medications as directed. Please refer to the Current Medication list given to you today.  *If you need a refill on your cardiac medications before your next appointment, please call your pharmacy*   Lab Work:  BMET AND MAG TODAY   If you have labs (blood work) drawn today and your tests are completely normal, you will receive your results only by: MyChart Message (if you have MyChart) OR A paper copy in the mail If you have any lab test that is abnormal or we need to change your treatment, we will call you to review the results.   Testing/Procedures: Your physician has requested that you have a lexiscan myoview. For further information please visit [email or web address] Please follow instruction sheet, as given.     Follow-Up: At [facility], you and your health needs are our priority.  As part of our continuing mission to provide you with exceptional heart care, we have created designated Provider Care Teams.  These Care Teams include your primary Cardiologist (physician) and Advanced Practice Providers (APPs -  Physician Assistants and Nurse Practitioners) who all work together to provide you with the care you need, when you need it.  We recommend signing up for the patient portal called "MyChart".  Sign up information is provided on this After Visit Summary.  MyChart is used to connect with patients for Virtual Visits (Telemedicine).  Patients are able to view lab/test results, encounter notes, upcoming appointments, etc.  Non-urgent messages can be sent to your provider as well.   To learn more about what you can do with MyChart, go to [email or web address].    Your next appointment:   2 month(s) ( CONTACT [facility] FOR EP SCHEDULING ISSUES )   The format for your next appointment:   In Person  Provider:   Steffanie Dunn, MD    Other Instructions

## 2021-05-14 NOTE — Progress Notes (Signed)
Cardiology Office Note Date:  05/14/2021  Patient ID:  Luis [name], DOB 1960-09-07, MRN [id number] PCP:  Georgann Housekeeper, MD  Electrophysiologist: Dr. Lalla Brothers    Chief Complaint: 91 day  History of Present Illness: Luis [name] is a 60 y.o. male with history of HTN, HLD, diverticulitis (w/perforation s/p colectomy and ileostomy complicated by wound infections for the past 2 years), CHB w/PPM  He was hospitalized 12/2020 with SOB found in CHB, unfortunately he had been struggling with his abdominal wounds for 2 years. Reported Changes his wounds at home but remained with ongoing drainage of foul smelling purulence, frequent fevers/chills.  Echo 12/26/2020 LVEF 65-70% cMRI 12/28/2020 i.  Normal LV size with mild LV hypertrophy, EF 72%. ii.  Normal RV size with EF 55%.  iii.  Normal ECV percentage is not suggestive of cardiac amyloidosis. iv. LGE noted in the mid-wall basal anteroseptum and mid-wall basal inferolateral wall. This is not a coronary disease pattern, could be consistent with cardiac sarcoidosis in the appropriate clinical situation, also prior myocarditis. Would suggest high resolution CT chest to look for any evidence for pulmonary sarcoidosis. 3. High Res Chest CT 12/29/2020 without evidence of ILD (See result note for full results) He needed pacing support prior to any intervention for his abdominal wound and ultimately underwent Micra AV implant There was discussion though that with sarcoid, there may be need for ICD in the future Recommended out patient PET Discharged 01/01/21  He saw [name], PA-C 03/01/21, was feeling better, reported progress in his wound though not healed, following [facility], not planned for surgery. He had some ectopy that could have been preventing optimal AS-VP. His Toprol increased.  Troubleshooting was done with industry support  TODAY The patient tells me that since the pacemaker his breathing immediately was better and remains improved Past  that he feels awful. When asked if he has had any CP or concerns/symptoms he thinks may be his heart he gets very defensive even , say "look at me, I feel terrible, wouldn't you?!" He reports that he has been flayed open and ever since has never felt well again He denies syncope, says a month ago he fell and did not have the strength to  get up, he doesn't know whay Eventually got himself up.  Symptoms/concerns are very difficult to weed out today, he reports he feels terrible, always does and is not new.  He is very emotional about the ongoing wound and inability to heal.  He reports that he has had to cancel a number of appointments with the surgeon as he has ours because he just doesn't feel well and cancels. He has an appt with Dr. [name] (he thinks is the name) surgery next month.He is not actively getting formal wound care, he changes the dressings when needed.  Says his son is very supportive and helpful but live a ways away and is hard for him to be there all of the time  He admits to smoking and drinking heavily.  When asked how much he drinks he saw "a lot", is not more specific.  He is not interested in quitting either currently.  Device information Micra AV PPM implanted MDT 12/31/2020   Past Medical History:  Diagnosis Date   Diabetes mellitus without complication (HCC)    Diverticulitis    DVT (deep venous thrombosis) (HCC)    ETOH abuse    H/O colectomy    Hyperlipidemia    Hypertension    Smoker  Past Surgical History:  Procedure Laterality Date   COLOSTOMY     PACEMAKER LEADLESS INSERTION N/A 12/31/2020   Procedure: PACEMAKER LEADLESS INSERTION;  Surgeon: [name], MD;  [facility];  Service: Cardiovascular;  Laterality: N/A;   TONSILLECTOMY      Current Outpatient Medications  Medication Sig Dispense Refill   acetaminophen (TYLENOL) 650 MG CR tablet Take 650 mg by mouth every 8 (eight) hours as needed for pain.      amLODipine  (NORVASC) 10 MG tablet Take 10 mg by mouth daily.     atorvastatin (LIPITOR) 10 MG tablet Take 10 mg by mouth daily.     folic acid (FOLVITE) 1 MG tablet Take 1 tablet (1 mg total) by mouth daily. 30 tablet 6   metFORMIN (GLUCOPHAGE) 1000 MG tablet Take 500 mg by mouth daily.      metoprolol succinate (TOPROL-XL) 50 MG 24 hr tablet Take 1 tablet (50 mg total) by mouth daily. 90 tablet 3   Multiple Vitamin (MULTIVITAMIN WITH MINERALS) TABS tablet Take 1 tablet by mouth every other day.      thiamine 100 MG tablet Take 1 tablet (100 mg total) by mouth daily. 30 tablet 6   traMADol (ULTRAM) 50 MG tablet Take 1 tablet (50 mg total) by mouth every 6 (six) hours as needed for moderate pain. 30 tablet 0   triamcinolone cream (KENALOG) 0.5 % as directed.     No current facility-administered medications for this visit.    Allergies:   Codeine   Social History:  The patient  reports that he has been smoking cigarettes. He has a 44.00 pack-year smoking history. He uses smokeless tobacco. He reports current alcohol use. He reports current drug use. Drug: Marijuana.   Family History:  The patient's family history includes COPD in his mother; Prostate cancer in his father.  ROS:  Please see the history of present illness.    All other systems are reviewed and otherwise negative.   PHYSICAL EXAM:  VS:  Pulse 64   Ht 6' (1.829 m)   Wt 208 lb (94.3 kg)   SpO2 97%   BMI 28.21 kg/m  BMI: Body mass index is 28.21 kg/m. Well nourished, well developed, in no acute distress, chronically ill appearing HEENT: normocephalic, atraumatic Neck: no JVD, carotid bruits or masses Cardiac:  RRR; no significant murmurs, no rubs, or gallops Lungs:  bronchial BS that partially clear with cough, CTA b/l otherwise Abd: ostomy R abdomen, bandage with ACE wrap covers his abdomen MS: no deformity or  atrophy Ext: no edema Skin: warm and dry, no rash Neuro:  No gross deficits appreciated Psych: euthymic mood, full  affect   EKG:  Done today and reviewed by myself shows  SRsome AS/VP (tracked beats), som sinus w/intrinsic conduction.  The intrinsic beats have marked T changes/inversion perhaps more so in the lateral leads  Device interrogation done today by industry and reviewed by myself:  Battery and electrode measurements are good AM/VP 80.4% Troubleshooting/optimization done with improve AV synchrony  Recent Labs: 12/26/2020: B Natriuretic Peptide 1,333.9 12/30/2020: Magnesium 1.6 04/01/2021: ALT 33; BUN 17; Creatinine, Ser 0.86; Hemoglobin 13.6; Platelets 260; Potassium 4.6; Sodium 138  12/26/2020: Cholesterol 140; HDL 48; LDL Cholesterol 53; Total CHOL/HDL Ratio 2.9; Triglycerides 196; VLDL 39   CrCl cannot be calculated (Patient's most recent lab result is older than the maximum 21 days allowed.).   Wt Readings from Last 3 Encounters:  05/14/21 208 lb (94.3 kg)  03/01/21 202 lb 6.4 oz (91.8 kg)  02/15/21 197 lb (89.4 kg)     Other studies reviewed: Additional studies/records reviewed today include: summarized above  ASSESSMENT AND PLAN:  PPM Intact function 80% AM/VP  ? Sarcoid PET scan not yet pursued He has been unable to stay compliant with his appointments as above  HTN Looks OK, manual BP by myself today is 138/70  4. Abnormal EKG Difficult to assess, though no specific cardiac complaints The patient reports chronically feeling poorly head to toe, with no clear new symptoms EKGs is reviewed and case discussed with DOD Will plan for stress test and get labs/lytes today   5. ETOH abuse I have urged the patient to cut back how much he drinks since stopping is not an option for him right now it seems  6. Abdominal wound Urged to see/call his PMD/surgeon and get back on track with his wound care.wound management   Disposition: F/u with [place] in 77mo, sooner if needed  Current medicines are reviewed at length with the patient today.  The patient did not have any concerns  regarding medicines.  Norma Fredrickson, PA-C 05/14/2021 1:04 PM     [facility] [address] [place] [address] [phone] (office)  [phone] (fax)

## 2021-05-15 LAB — MAGNESIUM: Magnesium: 1.8 mg/dL (ref 1.6–2.3)

## 2021-05-15 LAB — BASIC METABOLIC PANEL
BUN/Creatinine Ratio: 13 (ref 10–24)
BUN: 11 mg/dL (ref 8–27)
CO2: 24 mmol/L (ref 20–29)
Calcium: 9.7 mg/dL (ref 8.6–10.2)
Chloride: 101 mmol/L (ref 96–106)
Creatinine, Ser: 0.87 mg/dL (ref 0.76–1.27)
Glucose: 101 mg/dL — ABNORMAL HIGH (ref 70–99)
Potassium: 3.9 mmol/L (ref 3.5–5.2)
Sodium: 142 mmol/L (ref 134–144)
eGFR: 98 mL/min/{1.73_m2} (ref >59)

## 2021-05-18 ENCOUNTER — Telehealth (HOSPITAL_COMMUNITY): Payer: Self-pay | Admitting: *Deleted

## 2021-05-18 NOTE — Telephone Encounter (Signed)
Left message on voicemail in reference to upcoming appointment scheduled for 05/25/21 Phone number given for a call back so details instructions can be given. [name]   

## 2021-05-24 ENCOUNTER — Telehealth (HOSPITAL_COMMUNITY): Payer: Self-pay | Admitting: *Deleted

## 2021-05-24 NOTE — Telephone Encounter (Signed)
Left message on voicemail in reference to upcoming appointment scheduled for 05/25/21 Phone number given for a call back so details instructions can be given. Luis [name]

## 2021-05-25 ENCOUNTER — Ambulatory Visit (HOSPITAL_COMMUNITY): Payer: 59 | Attending: Cardiology

## 2021-05-25 ENCOUNTER — Encounter (HOSPITAL_COMMUNITY): Payer: Self-pay | Admitting: Physician Assistant

## 2021-07-05 ENCOUNTER — Ambulatory Visit (INDEPENDENT_AMBULATORY_CARE_PROVIDER_SITE_OTHER): Payer: 59

## 2021-07-05 DIAGNOSIS — I442 Atrioventricular block, complete: Secondary | ICD-10-CM

## 2021-07-06 LAB — CUP PACEART REMOTE DEVICE CHECK
Battery Remaining Longevity: 96 mo
Battery Voltage: 3.04 V
Brady Statistic AS VP Percent: 74.16 %
Brady Statistic AS VS Percent: 0.01 %
Brady Statistic RV Percent Paced: 99.89 %
Date Time Interrogation Session: 20230124182056
Implantable Pulse Generator Implant Date: 20220721
Lead Channel Impedance Value: 600 Ohm
Lead Channel Pacing Threshold Amplitude: 0.25 V
Lead Channel Pacing Threshold Pulse Width: 0.24 ms
Lead Channel Sensing Intrinsic Amplitude: 17.888 mV
Lead Channel Setting Pacing Amplitude: 0.875
Lead Channel Setting Pacing Pulse Width: 0.24 ms
Lead Channel Setting Sensing Sensitivity: 2 mV

## 2021-07-12 ENCOUNTER — Other Ambulatory Visit: Payer: Self-pay

## 2021-07-12 ENCOUNTER — Ambulatory Visit (HOSPITAL_COMMUNITY)
Admission: RE | Admit: 2021-07-12 | Discharge: 2021-07-12 | Disposition: A | Discharge status: Home / Self Care | Payer: Managed Care, Other (non HMO) | Source: Ambulatory Visit | Attending: Nurse Practitioner | Admitting: Nurse Practitioner

## 2021-07-12 DIAGNOSIS — L24B1 Irritant contact dermatitis related to digestive stoma or fistula: Secondary | ICD-10-CM

## 2021-07-12 DIAGNOSIS — Z432 Encounter for attention to ileostomy: Secondary | ICD-10-CM | POA: Insufficient documentation

## 2021-07-12 DIAGNOSIS — K9419 Other complications of enterostomy: Secondary | ICD-10-CM | POA: Diagnosis not present

## 2021-07-12 DIAGNOSIS — K432 Incisional hernia without obstruction or gangrene: Secondary | ICD-10-CM | POA: Diagnosis not present

## 2021-07-12 DIAGNOSIS — L259 Unspecified contact dermatitis, unspecified cause: Secondary | ICD-10-CM | POA: Insufficient documentation

## 2021-07-12 DIAGNOSIS — K435 Parastomal hernia without obstruction or  gangrene: Secondary | ICD-10-CM | POA: Insufficient documentation

## 2021-07-13 NOTE — Progress Notes (Signed)
[facility]   Reason for visit:  RLQ ileostomy with nonhealing abdominal wound and large abdominal hernia present, containing most of remaining bowel.   HPI:  Diverticulitis with Total abdominal colectomy, Ileostomy with Hartmann's Pouch.  Barriers to care include smoking and heavy ETOH use.  Here today with leaking pouch, hernia supported with ace wrap.  Wound care to abdomen in place (Xeroform gauze and ABD pads)  recently completed a course of antibiotics due to infection in abdominal wound, he reports ROS  Review of Systems  Gastrointestinal:        Large parastomal hernia RLQ ileostomy  Skin:        Peristomal skin breakdown throughout abdominal plane.  Wears heavy duty tape around pouch and midline abdominal wound.   Psychiatric/Behavioral:         States his quality of life is very poor due to leaking pouch, abdominal wound and huge hernia.   Emotional support provided that we would find alternatives to his current set up.   All other systems reviewed and are negative. Vital signs:  BP 137/75 (BP Location: Right Arm)    Pulse 66    Temp 98.3 F (36.8 C) (Oral)    Resp 18    SpO2 97%  Exam:  Physical Exam Abdominal:     Hernia: A hernia is present.  Skin:    Findings: Erythema and rash present.  Neurological:     Mental Status: He is alert.  Psychiatric:     Comments: Emotional distress over hernia, ostomy    Stoma type/location:  RLq ileostomy, recessed. Granulomas present from 6 to 12 o'clock.  Bleeds easily Stomal assessment/size:  1 1/4" opening with recessed stoma, granulomas protrude above skin level Peristomal assessment:  Extensive contact dermatitis to peristomal skin.  Medical adhesive related skin injury (MARSI) present to skin beyond pouching area due to extensive use of tape.  Skin between stoma and abdominal wound/hernia is  erythematous and dried plaques present.  Has been using triamcinolone for over 6 months with no improvement. This area  subjected to adhesive stripping as well.  Treatment  for stomal/peristomal skin: I am switching to Mio flip flat pouch, designed for rounded abdomens.  We use skin prep, powder, barrier ring I added barrier strips for additional support  samples provided for 3 more pouches.  Output: soft brown sto Ostomy pouching: 1pc Mio Education provided:  We pouch him today in a new one piece pouch designed for hernias and rounded abdomens.   The 2XL abdominal binder does not fit around his abdominal girth with the hernia. I have ordered a 4XL binder (special order) and this will take up to 10 days to arrive.  He will come back into the office once it arrives and we will have ongoing education. Once I am confident we have the correct pouch, we will address his peristomal skin breakdown.     Impression/dx  Contact dermatitis Parastomal hernia Discussion  See above Plan  Will see again once binder comes in.    Visit time: 55 minutes.   Domenic Moras FNP-BC

## 2021-07-15 NOTE — Discharge Instructions (Signed)
Waiting on binder to arrive Given samples of Mio Flip pouch to try Will see back in clinic once supplies arrive.

## 2021-07-16 NOTE — Progress Notes (Signed)
Remote pacemaker transmission.   

## 2021-07-19 ENCOUNTER — Ambulatory Visit (HOSPITAL_COMMUNITY): Payer: Managed Care, Other (non HMO)

## 2021-07-20 ENCOUNTER — Encounter: Payer: Managed Care, Other (non HMO) | Admitting: Cardiology

## 2021-07-20 ENCOUNTER — Ambulatory Visit (HOSPITAL_COMMUNITY): Payer: Managed Care, Other (non HMO)

## 2021-07-26 ENCOUNTER — Inpatient Hospital Stay (HOSPITAL_COMMUNITY): Admission: RE | Admit: 2021-07-26 | Payer: Managed Care, Other (non HMO) | Source: Ambulatory Visit

## 2021-07-29 ENCOUNTER — Other Ambulatory Visit: Payer: Self-pay | Admitting: Student

## 2021-07-30 ENCOUNTER — Ambulatory Visit (HOSPITAL_COMMUNITY)
Admission: RE | Admit: 2021-07-30 | Discharge: 2021-07-30 | Disposition: A | Discharge status: Home / Self Care | Payer: Managed Care, Other (non HMO) | Source: Ambulatory Visit | Attending: Internal Medicine | Admitting: Internal Medicine

## 2021-07-30 DIAGNOSIS — Y828 Other medical devices associated with adverse incidents: Secondary | ICD-10-CM | POA: Insufficient documentation

## 2021-07-30 DIAGNOSIS — T8189XA Other complications of procedures, not elsewhere classified, initial encounter: Secondary | ICD-10-CM | POA: Insufficient documentation

## 2021-07-30 DIAGNOSIS — T8189XD Other complications of procedures, not elsewhere classified, subsequent encounter: Secondary | ICD-10-CM | POA: Diagnosis not present

## 2021-07-30 DIAGNOSIS — Z432 Encounter for attention to ileostomy: Secondary | ICD-10-CM | POA: Insufficient documentation

## 2021-07-30 DIAGNOSIS — K9413 Enterostomy malfunction: Secondary | ICD-10-CM | POA: Diagnosis not present

## 2021-07-30 DIAGNOSIS — K435 Parastomal hernia without obstruction or  gangrene: Secondary | ICD-10-CM | POA: Insufficient documentation

## 2021-07-30 NOTE — Discharge Instructions (Signed)
We applied hernia belt today We applied aquacel (silver) dressing to abdominal wound  Change weekly If belt becomes soiled, clean with mild soap and water.

## 2021-07-30 NOTE — Progress Notes (Signed)
[facility]   Reason for visit:  RLq ileostomy with hernia and nonhealing surgical wound HPI:  Diverticulitis with perforation and ileostomy ROS  Review of Systems  Gastrointestinal:        Hernia to abdomen Midline surgical wound nonhealing RLQ ileostomy in deep crease , retracted  Skin:  Positive for wound.       Nonhealing surgical wound with slick, shiny appearance  Has been using Xeroform gauze.  Will implement silver hydrofiber to absorb moisture and provide antimicrobial protection.    Psychiatric/Behavioral:  Positive for agitation. The patient is nervous/anxious.        Patient is discouraged with leaking ostomy, large abdominal hernia and nonhealing wound.  He states he has no quality of life. Emotional support provided that we will work together to improve pouching and wound care.    All other systems reviewed and are negative. Vital signs:  BP (!) 131/91 (BP Location: Right Arm)    Pulse 85    Temp 97.7 F (36.5 C) (Oral)    Resp 18    SpO2 95%  Exam:  Physical Exam  Stoma type/location:  1 3/8" recessed and crease at 3 o'clock (area that was formerly his umbilicus) hernia has displaced areas.  See photos in chart.  Stomal assessment/size:  Recessed Peristomal assessment:  Peristomal skin is intact and improved from last visit.  Far perimeter of pouching area is erythematous and raised. He applies tape to this area to keep his pouch on.  He secures abdominal dressings, pouch and hernia with ace wraps around his abdomen for support.  Treatment options for stomal/peristomal skin: patient has extensive pouching process including liquid adhesive.  Barrier ring to peristomal skin and in creasing.  My goal would be to get him into a pouch not requiring the extra tape so his skin can heal.  He is proficient in his ostomy care and has tried most of the alternative pouching options I have suggested.  He brings his coloplast welcome kit today that has been mailed to him.   Output: soft brown stool Ostomy pouching: 1pc. With barrier ring Education provided:  Today, we add an abdominal hernia support belt/binder.  Abdominal girth is measured and he will require the 4XL size.  I have pre ordered this and we will try it today.  We cut the stomal opening in the belt.    We implement silver hydrofiber to wound bed with ABD pads on top.  Minimal tape applied to secure this dressing and abdominal binder applied.   He states he feels supported with this belt.      Impression/dx  Parastomal hernia Nonhealing surgical wound Recessed ileostomy Discussion  Use of binder Leaving silver dressing in place 5-7 days if drainage is not too heavy Emotional support for situational anxiety due to health condition Plan  Today we fitted with 4XL abdominal support binder from [employer].  The opening for his stoma is cut custom for him.   We will continue the same Hollister pouch so we do not change too many things at once.  He performs his own complex ostomy and wound care indepedently.  We are applying Aquacel Ag to open wounds instead of Xeroform.  I will see him back TUesday.  We have applied triamcinolone cream to periwound skin.  We have used fifty percent less tape today and I am hopeful we can begin to heal his skin.  Photos in chart of stoma and wound.     Visit  time: 80 minutes.   Domenic Moras FNP-BC

## 2021-08-03 ENCOUNTER — Encounter (HOSPITAL_COMMUNITY)
Admission: RE | Admit: 2021-08-03 | Discharge: 2021-08-03 | Disposition: A | Discharge status: Home / Self Care | Payer: Managed Care, Other (non HMO) | Source: Ambulatory Visit | Attending: Internal Medicine | Admitting: Internal Medicine

## 2021-08-03 ENCOUNTER — Other Ambulatory Visit: Payer: Self-pay

## 2021-08-03 DIAGNOSIS — L24B1 Irritant contact dermatitis related to digestive stoma or fistula: Secondary | ICD-10-CM | POA: Diagnosis not present

## 2021-08-03 DIAGNOSIS — K9419 Other complications of enterostomy: Secondary | ICD-10-CM | POA: Diagnosis not present

## 2021-08-03 DIAGNOSIS — Y828 Other medical devices associated with adverse incidents: Secondary | ICD-10-CM | POA: Diagnosis not present

## 2021-08-03 DIAGNOSIS — K435 Parastomal hernia without obstruction or  gangrene: Secondary | ICD-10-CM | POA: Diagnosis not present

## 2021-08-03 DIAGNOSIS — F17200 Nicotine dependence, unspecified, uncomplicated: Secondary | ICD-10-CM | POA: Insufficient documentation

## 2021-08-03 DIAGNOSIS — Z932 Ileostomy status: Secondary | ICD-10-CM | POA: Insufficient documentation

## 2021-08-03 DIAGNOSIS — Z01812 Encounter for preprocedural laboratory examination: Secondary | ICD-10-CM | POA: Insufficient documentation

## 2021-08-03 DIAGNOSIS — T8189XA Other complications of procedures, not elsewhere classified, initial encounter: Secondary | ICD-10-CM | POA: Insufficient documentation

## 2021-08-03 NOTE — Discharge Instructions (Signed)
Process for wound care:  Clean wound with normal saline  Apply 3 sheets Aquacel 1 whole sheet on right side of wound Cut one sheet in half and place two pieces along the bottom Cut one sheet in half and place along top or sides (whatever is left showing)  Cover with gauze and abdominal pad. Tape in place Change twice weekly Change Friday

## 2021-08-03 NOTE — Progress Notes (Signed)
[facility]   Reason for visit:  RLQ ileostomy Nonhealing midline abdominal wound Parastomal hernia HPI:  Perforated diverticulum for RLQ ileostomy, nonhealing abdominal surgical wound and large parastomal hernia (+) smoker (+) ETOH abuse- reports 5-6 vodka drinks daily ROS  Review of Systems  Gastrointestinal:        Hernia Nonhealing wound to abdomen RLQ ileostomy  Skin:  Positive for wound.       MARSI to peristomal and periwound skin from medical tape.   Psychiatric/Behavioral:  Positive for dysphoric mood.        Related to current state of health  Vital signs:  BP (!) 165/76 (BP Location: Right Arm)    Pulse 71    Temp 98 F (36.7 C) (Oral)    Resp 18    SpO2 96%  Exam:  Physical Exam Vitals reviewed.  Constitutional:      Appearance: He is normal weight.  Abdominal:     Comments: Large hernia present Open nonhealing surgical wound to abdomen  Neurological:     Mental Status: He is alert.    Stoma type/location:  RLQ ileostomy Stomal assessment/size:  1 3/8" recessed stoma, peristomal creasing. Nonhealing abdominal wound and large parastomal hernia (see photos)  Peristomal assessment:  See photos  Medical adhesive related skin injury, ongoing  patient uses large amount of medical tape around pouch and abdominal dressing.  We attempted a 4XL abdominal hernia support belt on last visit and he states it "popped off"  He also refers to pouch leakage as "popping off", so its unclear exactly what happened. I reminded patient he cannot let bag over fill or it will leak and detach.  He does not think this is what happened.   Treatment options for stomal/peristomal skin: 1 piece pouch with barrier ring. Skin is prepped with the dermal adhesive he uses each pouch change.  The skin immediately around the stoma is intact, the perimeter outside of the pouch barrier has breakdown consistent with medical adhesive related skin injury (MARSI) similar to last visit.  He did not  attempt the hernia belt longer than a day and went back to the ace wrap and tape.  Output: soft brown stool Ostomy pouching: 1pc.with barrier ring Education provided:  We perform wound care to abdominal wound.  It has less of a slick appearance today. No bleeding with dressing change.  I had hoped he could do this dressing change weekly. Due to thick effluent present in bandage after 4 days, he will need to change twice weekly to manage drainage.  We will monitor for improvement of this and ability to decrease to weekly.     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  HE states he cannot stop smoking and drinking.  THis is the only joy he has in life.  He understands no reparative surgery is likely while these impediments to healing are present.  He has tried AA in the past and smoking cessation as well.   Plan  See back next week to assess wound and ostomy progress.   See discharge instructions.  He will perform wound care once before next visit.     Visit time: 60 minutes.   Maple Hudson FNP-BC

## 2021-08-10 ENCOUNTER — Ambulatory Visit (HOSPITAL_COMMUNITY)
Admission: RE | Admit: 2021-08-10 | Discharge: 2021-08-10 | Disposition: A | Discharge status: Home / Self Care | Payer: Managed Care, Other (non HMO) | Source: Ambulatory Visit | Attending: Nurse Practitioner | Admitting: Nurse Practitioner

## 2021-08-10 DIAGNOSIS — L24B1 Irritant contact dermatitis related to digestive stoma or fistula: Secondary | ICD-10-CM | POA: Diagnosis not present

## 2021-08-10 DIAGNOSIS — K9413 Enterostomy malfunction: Secondary | ICD-10-CM

## 2021-08-10 DIAGNOSIS — T148XXA Other injury of unspecified body region, initial encounter: Secondary | ICD-10-CM

## 2021-08-10 DIAGNOSIS — K432 Incisional hernia without obstruction or gangrene: Secondary | ICD-10-CM | POA: Diagnosis not present

## 2021-08-10 DIAGNOSIS — T8189XA Other complications of procedures, not elsewhere classified, initial encounter: Secondary | ICD-10-CM | POA: Diagnosis not present

## 2021-08-10 DIAGNOSIS — Y828 Other medical devices associated with adverse incidents: Secondary | ICD-10-CM | POA: Diagnosis not present

## 2021-08-10 DIAGNOSIS — Z932 Ileostomy status: Secondary | ICD-10-CM | POA: Diagnosis not present

## 2021-08-10 NOTE — Progress Notes (Signed)
[facility]   Reason for visit:  RLQ ileostomy HPI:  Perforated diverticulum with RLQ ileostomy Large abdominal hernia Nonhealing abdominal surgical wound Photos in chart ROS  Review of Systems  Gastrointestinal:  Positive for abdominal distention.       Large abdominal hernia  Nonhealing surgical wound  Skin:  Positive for wound.       Midline abdominal  All other systems reviewed and are negative. Vital signs:  BP (!) 161/85 (BP Location: Right Arm)    Pulse 80    Temp 98.3 F (36.8 C) (Oral)    Resp 18    SpO2 97%  Exam:  Physical Exam Constitutional:      Appearance: Normal appearance.  Abdominal:     Hernia: A hernia is present.     Comments: RLQ ilestomy Large abdominal hernia requiring support with ace bandages  Neurological:     Mental Status: He is alert and oriented to person, place, and time.  Psychiatric:        Mood and Affect: Mood normal.    Stoma type/location:  RLQ ileostomy Stomal assessment/size:  1 1/2" recessed stoma with peristomal creasing and parastomal hernia  Peristomal assessment:  see photos  Midline wound, abdominal hernia Treatment options for stomal/peristomal skin: 1 piece pouch with barrier ring We have been using Aquacel Ag to try and jumpstart healing.  Some new epithelial growth noted at periphery.  Heavy drainage.  Did not like 4XL hernia belt although it fit and supported him.  Continues to wrap large ace wraps around waist and beneath hernia for support.  Output: soft brown stool Ostomy pouching: 1pc. Education provided:  continue Aquacel ag to wound see back in one week Peristomal skin improved.    Impression/dx  RLQ ileostomy Nonhealing surgical wound Parastomal hernia Discussion  Mood and outlet are more positive and optimistic today.  He is performing wound are at home and ostomy care.  Plan  See back in one week.     Visit time: 50 minutes.   Maple Hudson FNP-BC

## 2021-08-16 NOTE — Discharge Instructions (Signed)
See me in one week.  Change abdominal dressing twice weekly.

## 2021-08-17 ENCOUNTER — Ambulatory Visit (HOSPITAL_COMMUNITY)
Admission: RE | Admit: 2021-08-17 | Discharge: 2021-08-17 | Disposition: A | Discharge status: Home / Self Care | Payer: Managed Care, Other (non HMO) | Source: Ambulatory Visit | Attending: Internal Medicine | Admitting: Internal Medicine

## 2021-08-17 ENCOUNTER — Other Ambulatory Visit: Payer: Self-pay

## 2021-08-17 DIAGNOSIS — Z432 Encounter for attention to ileostomy: Secondary | ICD-10-CM | POA: Diagnosis not present

## 2021-08-17 DIAGNOSIS — T8189XA Other complications of procedures, not elsewhere classified, initial encounter: Secondary | ICD-10-CM | POA: Diagnosis not present

## 2021-08-17 DIAGNOSIS — Y828 Other medical devices associated with adverse incidents: Secondary | ICD-10-CM | POA: Insufficient documentation

## 2021-08-17 DIAGNOSIS — K435 Parastomal hernia without obstruction or  gangrene: Secondary | ICD-10-CM | POA: Diagnosis not present

## 2021-08-17 DIAGNOSIS — L24B1 Irritant contact dermatitis related to digestive stoma or fistula: Secondary | ICD-10-CM

## 2021-08-17 DIAGNOSIS — K432 Incisional hernia without obstruction or gangrene: Secondary | ICD-10-CM

## 2021-08-17 DIAGNOSIS — Z932 Ileostomy status: Secondary | ICD-10-CM | POA: Insufficient documentation

## 2021-08-17 NOTE — Progress Notes (Signed)
[facility]  ? ?Reason for visit:  ?RLQ ileostomy with midline nonhealing surgical wound and hernia ?HPI:  ?Perforated diverticulitis with resulting nonhealing surgical wound, ileostomy and parastomal hernia.  ?ROS  ?Review of Systems  ?Gastrointestinal:   ?     RLQ ileostomy ?Parastomal hernia ?Open wound, improving  ?Skin:  Positive for color change, rash and wound.  ?     Contact dermatitis to peristomal skin and periwound skin.  Heavy use of medical adhesives.  Using triamcinolone cream ?  ?Psychiatric/Behavioral:  Positive for dysphoric mood.   ?     States he has poor quality of life due to nonhealing wound, ostomy and large hernia. He is not a threat to himself or others.  He feels depressed many days about his current situation.   ?All other systems reviewed and are negative. ?Vital signs:  ?BP (!) 168/80 (BP Location: Right Arm)   Pulse 80   Temp 97.8 ?F (36.6 ?C) (Oral)   Resp 20   SpO2 98%  ?Exam:  ?Physical Exam ?Abdominal:  ?   Comments: Hernia present  ?Skin: ?   Findings: Rash present.  ?Neurological:  ?   Mental Status: He is alert.  ?Psychiatric:  ?   Comments: Depressed  ?  ?Stoma type/location:  1 3/8" recessed stoma located in creasing.   ?Stomal assessment/size:  pink and moist  producing soft brown stool ?Peristomal assessment:  denuded skin at adhesive periphery from use of medical tape.  ?Midline wound is improving.  WOund bed is pink and moist and new epithelial growth is noted today.  Remains with moderate drainage.  Silver hydrofiber being used- changed twice weekly.  ?Treatment options for stomal/peristomal skin: 1 piece pouch convex, barrier ring.  ?Aquacel Ag to open wound ?Support for hernia through use of hernia belt or ACE wraps  ?Output: soft brown stool ?Ostomy pouching: 1pc. convex ?Education provided:  Wound is improving, continue Aquacel Ag twice weekly.  ?Try to minimize use of tape to peristomal skin.  ? ?  ?Impression/dx  ?Contact dermatitis ?Hernia ?Midline  surgical wound, chronic nonhealing ? ?Discussion  ?See back in one week ?Continue wound and ostomy care.  ?Plan  ?Appointment scheduled.  ? ? ? ?Visit time: 55 minutes.  ? ?Maple Hudson FNP-BC ? ?  ?

## 2021-08-19 NOTE — Discharge Instructions (Signed)
Continue Aquacel Ag to open wounds ?Ostomy care as needed ?

## 2021-08-24 ENCOUNTER — Ambulatory Visit (HOSPITAL_COMMUNITY): Payer: Managed Care, Other (non HMO)

## 2021-08-31 ENCOUNTER — Ambulatory Visit (HOSPITAL_COMMUNITY)
Admission: RE | Admit: 2021-08-31 | Discharge: 2021-08-31 | Disposition: A | Discharge status: Home / Self Care | Payer: Managed Care, Other (non HMO) | Source: Ambulatory Visit | Attending: Nurse Practitioner | Admitting: Nurse Practitioner

## 2021-08-31 ENCOUNTER — Other Ambulatory Visit: Payer: Self-pay

## 2021-08-31 DIAGNOSIS — K435 Parastomal hernia without obstruction or  gangrene: Secondary | ICD-10-CM | POA: Diagnosis not present

## 2021-08-31 DIAGNOSIS — K631 Perforation of intestine (nontraumatic): Secondary | ICD-10-CM | POA: Insufficient documentation

## 2021-08-31 DIAGNOSIS — L259 Unspecified contact dermatitis, unspecified cause: Secondary | ICD-10-CM | POA: Insufficient documentation

## 2021-08-31 DIAGNOSIS — T8189XA Other complications of procedures, not elsewhere classified, initial encounter: Secondary | ICD-10-CM | POA: Diagnosis not present

## 2021-08-31 DIAGNOSIS — Y838 Other surgical procedures as the cause of abnormal reaction of the patient, or of later complication, without mention of misadventure at the time of the procedure: Secondary | ICD-10-CM | POA: Insufficient documentation

## 2021-08-31 DIAGNOSIS — K432 Incisional hernia without obstruction or gangrene: Secondary | ICD-10-CM

## 2021-08-31 DIAGNOSIS — Z432 Encounter for attention to ileostomy: Secondary | ICD-10-CM | POA: Insufficient documentation

## 2021-08-31 DIAGNOSIS — L24B1 Irritant contact dermatitis related to digestive stoma or fistula: Secondary | ICD-10-CM | POA: Diagnosis not present

## 2021-08-31 NOTE — Discharge Instructions (Signed)
See Tuesday at 11:00 ?Shower once weekly.  ?

## 2021-08-31 NOTE — Progress Notes (Signed)
[facility]  ? ?Reason for visit:  ?Nonhealing wound to midline abdomen ?Abdominal hernia ?RLQ ileostomy ?HPI:  ?Perforated diverticulum, large abdominal hernia ileostomy ?ROS  ?Review of Systems  ?All other systems reviewed and are negative. ?Vital signs:  ?BP (!) 171/83   Pulse 82   Temp 98 ?F (36.7 ?C)   Resp 19   SpO2 98%  ?Exam:  ?Physical Exam ?Vitals reviewed.  ?Constitutional:   ?   Appearance: Normal appearance.  ?Abdominal:  ?   Hernia: A hernia is present.  ?   Comments: Parastomal hernia ?Open wound   ?Skin: ?   Findings: Lesion and rash present.  ?   Comments: Medical adhesive related skin injury to peristomal skin  ?Neurological:  ?   Mental Status: He is alert and oriented to person, place, and time.  ?Psychiatric:     ?   Mood and Affect: Mood normal.  ?  ?Stoma type/location:  RLQ  ?Stomal assessment/size:  1 3/8" recessed and creasing at 3 o'clock ?Peristomal assessment:  Contact dermatitis ?Treatment options for stomal/peristomal skin: barrier ring and convex pouch ?Output: soft brown stool ?Ostomy pouching: 1pc.convex ?Education provided:  WOund care provided ? ?Medical supplier is substituting silver alginate instead of aquacel and dressing seems to be stuck more to midline open wound. This is resulting in more bleeding. Dimensions are improving and new epithelial growth is noted. Photo placed in chart.  ? ?  ?Impression/dx  ?Contact dermatitis ?Parastomal hernia  ?Open chronic wound ?Discussion  ? ?I apply aquacel to wound bed, we are able to use only 2 sheets instead of 3.    ?See back in one week ? ? ? ?Visit time: 45 minutes.  ? ?Domenic Moras FNP-BC ? ?  ?

## 2021-09-07 ENCOUNTER — Ambulatory Visit (HOSPITAL_COMMUNITY)
Admission: RE | Admit: 2021-09-07 | Discharge: 2021-09-07 | Disposition: A | Discharge status: Home / Self Care | Payer: Managed Care, Other (non HMO) | Source: Ambulatory Visit | Attending: Nurse Practitioner | Admitting: Nurse Practitioner

## 2021-09-07 DIAGNOSIS — T8189XD Other complications of procedures, not elsewhere classified, subsequent encounter: Secondary | ICD-10-CM

## 2021-09-07 DIAGNOSIS — R251 Tremor, unspecified: Secondary | ICD-10-CM | POA: Insufficient documentation

## 2021-09-07 DIAGNOSIS — S3092XA Unspecified superficial injury of abdominal wall, initial encounter: Secondary | ICD-10-CM | POA: Insufficient documentation

## 2021-09-07 DIAGNOSIS — X58XXXA Exposure to other specified factors, initial encounter: Secondary | ICD-10-CM | POA: Insufficient documentation

## 2021-09-07 DIAGNOSIS — K469 Unspecified abdominal hernia without obstruction or gangrene: Secondary | ICD-10-CM | POA: Insufficient documentation

## 2021-09-07 DIAGNOSIS — R531 Weakness: Secondary | ICD-10-CM | POA: Insufficient documentation

## 2021-09-07 DIAGNOSIS — Y848 Other medical procedures as the cause of abnormal reaction of the patient, or of later complication, without mention of misadventure at the time of the procedure: Secondary | ICD-10-CM | POA: Insufficient documentation

## 2021-09-07 DIAGNOSIS — R52 Pain, unspecified: Secondary | ICD-10-CM | POA: Diagnosis not present

## 2021-09-07 DIAGNOSIS — L245 Irritant contact dermatitis due to other chemical products: Secondary | ICD-10-CM | POA: Diagnosis not present

## 2021-09-07 DIAGNOSIS — K9419 Other complications of enterostomy: Secondary | ICD-10-CM | POA: Diagnosis not present

## 2021-09-07 DIAGNOSIS — R450 Nervousness: Secondary | ICD-10-CM | POA: Insufficient documentation

## 2021-09-07 DIAGNOSIS — S31109A Unspecified open wound of abdominal wall, unspecified quadrant without penetration into peritoneal cavity, initial encounter: Secondary | ICD-10-CM | POA: Diagnosis not present

## 2021-09-07 DIAGNOSIS — L249 Irritant contact dermatitis, unspecified cause: Secondary | ICD-10-CM | POA: Diagnosis not present

## 2021-09-07 DIAGNOSIS — R21 Rash and other nonspecific skin eruption: Secondary | ICD-10-CM | POA: Insufficient documentation

## 2021-09-07 DIAGNOSIS — Z432 Encounter for attention to ileostomy: Secondary | ICD-10-CM | POA: Insufficient documentation

## 2021-09-07 DIAGNOSIS — R195 Other fecal abnormalities: Secondary | ICD-10-CM | POA: Diagnosis not present

## 2021-09-07 NOTE — Progress Notes (Signed)
 [facility]  ? ?Reason for visit:  ?Nonhealing wound to midline abdomen ?Abdominal hernia ?RLQ ileostomy ?HPI:  ?RLQ ileostomy ?Perforated diverticulum ?Review of Systems  ?Gastrointestinal:   ?     RLQ ileostomy ?Abdominal hernia  ?Skin:  Positive for wound.  ?Neurological:  Positive for tremors and weakness.  ?Psychiatric/Behavioral:  The patient is nervous/anxious.   ?     Showered at last pouch/dressing change at home. Today, is trembling and nervous  ?All other systems reviewed and are negative. ?Vital signs:  ?BP (!) 167/95   Pulse 83   Temp 98.3 ?F (36.8 ?C)   Resp 20   SpO2 95%  ?Exam:  ?Physical Exam ?Constitutional:   ?   Appearance: Normal appearance.  ?Abdominal:  ?   Palpations: Abdomen is soft.  ?   Hernia: A hernia is present.  ?   Comments: Open wound to abdomen  ?Skin: ?   Findings: Rash present.  ?Neurological:  ?   Mental Status: He is alert and oriented to person, place, and time.  ?Psychiatric:  ?   Comments: Anxious today.  States he is in a lot of pain today.    ?  ?Stoma type/location:  RLQ pouch changed today as it is leaking.  Reassess Medical adhesive related skin injury.  Photos in chart ?Stomal assessment/size:  1 3/8" recessed stoma and deep creasing at 3 o'clock ?Peristomal assessment:  irritant dermatitis to distal perimeter where patient applies various tapes to maintain seal.  ?Treatment options for stomal/peristomal skin:  ?Skin adhesive, barrier ring, stoma powder and skin prep.   ?Output: liquid yellow stool ?Ostomy pouching: 1pc.convex ?Education provided:  no changes in pouching, this is the only pouch system that works for him.  Ace wraps around abdomen and torso to support hernia.  ? ?  ?Impression/dx  ?Contact dermatitis ?Discussion  ?No changes today ?Plan  ?See in one week. ? ? ? ?Visit time: 55 minutes.  ? ?Maple Hudson FNP-BC ? ?  ?

## 2021-09-09 NOTE — Discharge Instructions (Signed)
Appointment made

## 2021-09-13 ENCOUNTER — Encounter (HOSPITAL_COMMUNITY)
Admission: RE | Admit: 2021-09-13 | Discharge: 2021-09-13 | Disposition: A | Discharge status: Home / Self Care | Payer: Managed Care, Other (non HMO) | Source: Ambulatory Visit | Attending: Internal Medicine | Admitting: Internal Medicine

## 2021-09-13 DIAGNOSIS — L24B1 Irritant contact dermatitis related to digestive stoma or fistula: Secondary | ICD-10-CM

## 2021-09-13 DIAGNOSIS — Z01812 Encounter for preprocedural laboratory examination: Secondary | ICD-10-CM | POA: Insufficient documentation

## 2021-09-13 DIAGNOSIS — Y828 Other medical devices associated with adverse incidents: Secondary | ICD-10-CM | POA: Diagnosis not present

## 2021-09-13 DIAGNOSIS — Z432 Encounter for attention to ileostomy: Secondary | ICD-10-CM | POA: Diagnosis not present

## 2021-09-13 DIAGNOSIS — K469 Unspecified abdominal hernia without obstruction or gangrene: Secondary | ICD-10-CM | POA: Diagnosis not present

## 2021-09-13 DIAGNOSIS — K432 Incisional hernia without obstruction or gangrene: Secondary | ICD-10-CM | POA: Diagnosis not present

## 2021-09-13 DIAGNOSIS — Z932 Ileostomy status: Secondary | ICD-10-CM | POA: Diagnosis not present

## 2021-09-13 DIAGNOSIS — T8189XA Other complications of procedures, not elsewhere classified, initial encounter: Secondary | ICD-10-CM | POA: Insufficient documentation

## 2021-09-13 NOTE — Progress Notes (Signed)
[facility]  ? ?Reason for visit:  ?RLQ ileostomy and nonhealing surgical wound with abdominal hernia ?HPI:  ?PErforated diverticuulum ?ROS  ?Review of Systems  ?Constitutional: Negative.   ?Gastrointestinal:   ?     RLQ ileostomy ?Nonhealing surgical wound ?Abdominal hernia  ?Skin:  Positive for rash and wound.  ?     Midline abdominal surgical wound, nonhealing ?Medical adhesive related skin injury to peristomal skin at distal edge from additional medical tape being used  ?Psychiatric/Behavioral:  The patient is nervous/anxious.   ?All other systems reviewed and are negative. ?Vital signs:  ?BP (!) 162/86   Pulse (!) 109  ?Exam:  ?Physical Exam ?Vitals reviewed.  ?Abdominal:  ?   Hernia: A hernia is present.  ?  ?Stoma type/location:  RLQ ileostomy ?Stomal assessment/size:  1 3/8" recessed stoma with deep creasing.  ?Peristomal assessment:  irritant dermatitis is improved today ?Treatment options for stomal/peristomal skin: SKin adhesive, barrier ring and skin prep ?Output: soft yellow stool ?Ostomy pouching: 1pc convex  ?Education provided:  emotional support provided, he is frustrated that wound is not further along in healing ? ?  ?Impression/dx  ?Irritant contact dermatitis ?Parastomal hernia ?Nonhealing surgical wound ?Discussion  ?Discussed that wound is improving.  Must minimize adhesive tapes used.  ?Plan  ?COntinue silver dressing to abdomen ?COntinue 1 piece pouch ?Ace wrap to abdomen to support hernia ? ? ? ?Visit time: 50 minutes.  ? ?Maple Hudson FNP-BC ? ?  ?

## 2021-09-20 NOTE — Discharge Instructions (Signed)
Continue aquacel Ag ?See in one week to monitor skin dermatitis and wound healing ?

## 2021-09-21 ENCOUNTER — Ambulatory Visit (HOSPITAL_COMMUNITY)
Admission: RE | Admit: 2021-09-21 | Discharge: 2021-09-21 | Disposition: A | Discharge status: Home / Self Care | Payer: Managed Care, Other (non HMO) | Source: Ambulatory Visit | Attending: Plastic Surgery | Admitting: Plastic Surgery

## 2021-09-21 DIAGNOSIS — Y828 Other medical devices associated with adverse incidents: Secondary | ICD-10-CM | POA: Insufficient documentation

## 2021-09-21 DIAGNOSIS — Z79899 Other long term (current) drug therapy: Secondary | ICD-10-CM | POA: Diagnosis not present

## 2021-09-21 DIAGNOSIS — K5780 Diverticulitis of intestine, part unspecified, with perforation and abscess without bleeding: Secondary | ICD-10-CM | POA: Diagnosis not present

## 2021-09-21 DIAGNOSIS — T8189XA Other complications of procedures, not elsewhere classified, initial encounter: Secondary | ICD-10-CM | POA: Insufficient documentation

## 2021-09-21 DIAGNOSIS — L245 Irritant contact dermatitis due to other chemical products: Secondary | ICD-10-CM | POA: Diagnosis not present

## 2021-09-21 DIAGNOSIS — L24B1 Irritant contact dermatitis related to digestive stoma or fistula: Secondary | ICD-10-CM

## 2021-09-21 DIAGNOSIS — Z932 Ileostomy status: Secondary | ICD-10-CM | POA: Diagnosis not present

## 2021-09-21 DIAGNOSIS — K469 Unspecified abdominal hernia without obstruction or gangrene: Secondary | ICD-10-CM | POA: Insufficient documentation

## 2021-09-21 DIAGNOSIS — K432 Incisional hernia without obstruction or gangrene: Secondary | ICD-10-CM

## 2021-09-21 DIAGNOSIS — Y838 Other surgical procedures as the cause of abnormal reaction of the patient, or of later complication, without mention of misadventure at the time of the procedure: Secondary | ICD-10-CM | POA: Diagnosis not present

## 2021-09-21 NOTE — Discharge Instructions (Signed)
See in one week Continue aquacel    Will likely apply silver nitrate to hypergranulation next week 

## 2021-09-21 NOTE — Progress Notes (Signed)
 [facility]  ? ?Reason for visit:  ?RLQ ileostomy ?Abdominal hernia ?Nonhealing surgical wound ?Medical adhesive related skin injury ?HPI:  ?Perforated diverticulitis ?ROS  ?Review of Systems  ?Constitutional: Negative.   ?Cardiovascular:   ?     Upcoming appointment with cardiologist, pacemaker check  ?Gastrointestinal:   ?     Ileostomy ?Abdominal hernia ?Midline abdominal wound, not healing  ?Psychiatric/Behavioral:  Positive for dysphoric mood.   ?     Is frustrated with life with a hernia/wound/ostomy ?  ?All other systems reviewed and are negative. ?Vital signs:  ?BP (!) 156/86   Pulse 93   Temp 97.6 ?F (36.4 ?C)   SpO2 98%  ?Exam:  ?Physical Exam ?Vitals reviewed.  ?Abdominal:  ?   Hernia: A hernia is present.  ?   Comments: Nonhealing surgical wound (See photos) ?RLQ ileostomy with deep creasing at 3:00 ?Abdominal hernia  ?Skin: ?   Comments: Medical adhesive related skin injury to distal perimeter of ostomy pouching area and distal border of wound  All from medical tape.  Uses triamcinolone cream.  May consider topical aerosol mist (nasacort) to this area if pouch sticking remains an issue.  Patient hesitant to do this.   ?Neurological:  ?   Mental Status: He is alert and oriented to person, place, and time.  ?Psychiatric:     ?   Mood and Affect: Mood normal.  ?  ?Stoma type/location:  RLQ ileostomy   ?Stomal assessment/size:  recessed  ?Peristomal skin intact, periphery of pouching area with noted breakdown and irritation from use of additional tape.  Using kenalog cream with pouch changes.  ?Peristomal assessment:  see above  Creasing at 3 o'clock, can impede pouch seal ?Treatment options for stomal/peristomal skin: Skin liquid adhesive, barrier ring, powder and skin prep ?1 piece convex pouch ?Output: soft brown stool ?Ostomy pouching: 1pc convex ?Ace bandage for hernia support .  ?Education provided:  Change pouch if burning or leaking.  DO not leave on and reinforce with tape.  ? ?   ?Impression/dx  ?Irritant contact dermatitis ?Medical adhesive related skin injury ?DX: ?Nonhealing surgical wound ?Abdominal hernia ?Plan  ?COntinue Aquacel Ag silver dressing as this is improving wound slowly.  ?If stalls ,may consider Hydrofera Blue ? ? ? ?Visit time: 50 minutes.  ? ?Maple Hudson FNP-BC ? ?  ?

## 2021-09-28 ENCOUNTER — Ambulatory Visit (HOSPITAL_COMMUNITY): Payer: Managed Care, Other (non HMO)

## 2021-09-30 ENCOUNTER — Telehealth: Payer: Self-pay | Admitting: Cardiology

## 2021-09-30 NOTE — Telephone Encounter (Signed)
Called patient and informed him that [employer] rep had taken a look at his transmission and it did not look any different than previous transmission. Advised patient to make sure he keeps his apt with Dr. Lalla Brothers on 10/21/21. Patient stated that he just wakes up usually about 2-3AM every morning feeling like hes suffocating he didn't know if it was allergies or anxiety advised patient to f/u with his PCP patient voiced understanding.  ?

## 2021-09-30 NOTE — Telephone Encounter (Signed)
Called patient back informed him that I had reviewed the transmission, patient has a Micra, that I did not see anything to be concerned about but I would follow up with [employer] to ensure patient stated that he was a little SOB, but could be due to allergies.  ?

## 2021-09-30 NOTE — Telephone Encounter (Signed)
Patient calling in to have his remote check today because of how he is feeling. Please advise ?

## 2021-10-01 ENCOUNTER — Ambulatory Visit (HOSPITAL_COMMUNITY): Payer: Managed Care, Other (non HMO)

## 2021-10-02 ENCOUNTER — Other Ambulatory Visit: Payer: Self-pay

## 2021-10-02 ENCOUNTER — Emergency Department (HOSPITAL_COMMUNITY)
Admission: EM | Admit: 2021-10-02 | Discharge: 2021-10-02 | Disposition: A | Discharge status: Home / Self Care | Payer: Managed Care, Other (non HMO) | Attending: Emergency Medicine | Admitting: Emergency Medicine

## 2021-10-02 ENCOUNTER — Encounter (HOSPITAL_COMMUNITY): Payer: Self-pay | Admitting: Emergency Medicine

## 2021-10-02 DIAGNOSIS — Y908 Blood alcohol level of 240 mg/100 ml or more: Secondary | ICD-10-CM | POA: Insufficient documentation

## 2021-10-02 DIAGNOSIS — R58 Hemorrhage, not elsewhere classified: Secondary | ICD-10-CM | POA: Insufficient documentation

## 2021-10-02 DIAGNOSIS — F17210 Nicotine dependence, cigarettes, uncomplicated: Secondary | ICD-10-CM | POA: Insufficient documentation

## 2021-10-02 DIAGNOSIS — Z5321 Procedure and treatment not carried out due to patient leaving prior to being seen by health care provider: Secondary | ICD-10-CM | POA: Diagnosis not present

## 2021-10-02 LAB — CBC
HCT: 38 % — ABNORMAL LOW (ref 39.0–52.0)
Hemoglobin: 12.9 g/dL — ABNORMAL LOW (ref 13.0–17.0)
MCH: 36.1 pg — ABNORMAL HIGH (ref 26.0–34.0)
MCHC: 33.9 g/dL (ref 30.0–36.0)
MCV: 106.4 fL — ABNORMAL HIGH (ref 80.0–100.0)
Platelets: 246 10*3/uL (ref 150–400)
RBC: 3.57 MIL/uL — ABNORMAL LOW (ref 4.22–5.81)
RDW: 13.2 % (ref 11.5–15.5)
WBC: 10.7 10*3/uL — ABNORMAL HIGH (ref 4.0–10.5)
nRBC: 0 % (ref 0.0–0.2)

## 2021-10-02 LAB — COMPREHENSIVE METABOLIC PANEL
ALT: 37 U/L (ref 0–44)
AST: 58 U/L — ABNORMAL HIGH (ref 15–41)
Albumin: 3.6 g/dL (ref 3.5–5.0)
Alkaline Phosphatase: 107 U/L (ref 38–126)
Anion gap: 13 (ref 5–15)
BUN: 13 mg/dL (ref 8–23)
CO2: 22 mmol/L (ref 22–32)
Calcium: 9 mg/dL (ref 8.9–10.3)
Chloride: 102 mmol/L (ref 98–111)
Creatinine, Ser: 0.86 mg/dL (ref 0.61–1.24)
GFR, Estimated: >60 mL/min (ref >60)
Glucose, Bld: 120 mg/dL — ABNORMAL HIGH (ref 70–99)
Potassium: 3.5 mmol/L (ref 3.5–5.1)
Sodium: 137 mmol/L (ref 135–145)
Total Bilirubin: 0.9 mg/dL (ref 0.3–1.2)
Total Protein: 7.6 g/dL (ref 6.5–8.1)

## 2021-10-02 LAB — ETHANOL: Alcohol, Ethyl (B): 291 mg/dL — ABNORMAL HIGH (ref <10)

## 2021-10-02 LAB — PROTIME-INR
INR: 0.9 (ref 0.8–1.2)
Prothrombin Time: 12.2 seconds (ref 11.4–15.2)

## 2021-10-02 NOTE — ED Provider Triage Note (Signed)
Emergency Medicine Provider Triage Evaluation Note ? ?Luis [name] , a 61 y.o. male  was evaluated in triage.  Pt complains of abdominal wound bleeding.  Patient is complex medical history.  This wound on the patient's abdomen is chronically bleeding, been seen by general surgery, patient not candidate for surgery due to excessive alcohol and cigarette smoking.  Patient states this morning he was at home and the wound began bleeding when he "peeled the scab off".  Patient has strong odor of alcohol.  Denies all symptoms. ? ?Review of Systems  ?Positive:  ?Negative:  ? ?Physical Exam  ?BP (!) 131/108 (BP Location: Left Arm)   Pulse 81   Temp 97.6 ?F (36.4 ?C) (Oral)   Resp 16   SpO2 100%  ?Gen:   Awake, no distress   ?Resp:  Normal effort  ?MSK:   Moves extremities without difficulty  ?Other:   ? ?Medical Decision Making  ?Medically screening exam initiated at 9:31 AM.  Appropriate orders placed.  [name] was informed that the remainder of the evaluation will be completed by another provider, this initial triage assessment does not replace that evaluation, and the importance of remaining in the ED until their evaluation is complete. ? ? ?  ?Al Decant, PA-C ?10/02/21 0175 ? ?

## 2021-10-02 NOTE — ED Triage Notes (Signed)
Pt to triage via [facility] from home.  Reports chronic abd wound that was bleeding this morning.  States he pulled the scab off accidentally while getting in shower and it was bleeding but stopped by the time EMS arrived.  Pt states he comes to hospital weekly for wound care. Bandage in place.  ETOH this morning. ? ? ?

## 2021-10-02 NOTE — ED Notes (Signed)
Pt witness leaving by taxi. ?

## 2021-10-04 ENCOUNTER — Ambulatory Visit (HOSPITAL_COMMUNITY): Payer: Managed Care, Other (non HMO)

## 2021-10-05 ENCOUNTER — Ambulatory Visit (HOSPITAL_COMMUNITY)
Admission: RE | Admit: 2021-10-05 | Discharge: 2021-10-05 | Disposition: A | Discharge status: Home / Self Care | Payer: Managed Care, Other (non HMO) | Source: Ambulatory Visit | Attending: Nurse Practitioner | Admitting: Nurse Practitioner

## 2021-10-05 DIAGNOSIS — M791 Myalgia, unspecified site: Secondary | ICD-10-CM | POA: Insufficient documentation

## 2021-10-05 DIAGNOSIS — L259 Unspecified contact dermatitis, unspecified cause: Secondary | ICD-10-CM | POA: Diagnosis not present

## 2021-10-05 DIAGNOSIS — L24B1 Irritant contact dermatitis related to digestive stoma or fistula: Secondary | ICD-10-CM | POA: Diagnosis not present

## 2021-10-05 DIAGNOSIS — Y838 Other surgical procedures as the cause of abnormal reaction of the patient, or of later complication, without mention of misadventure at the time of the procedure: Secondary | ICD-10-CM | POA: Insufficient documentation

## 2021-10-05 DIAGNOSIS — K9419 Other complications of enterostomy: Secondary | ICD-10-CM | POA: Diagnosis present

## 2021-10-05 DIAGNOSIS — Z432 Encounter for attention to ileostomy: Secondary | ICD-10-CM | POA: Diagnosis not present

## 2021-10-05 DIAGNOSIS — K435 Parastomal hernia without obstruction or  gangrene: Secondary | ICD-10-CM | POA: Insufficient documentation

## 2021-10-05 NOTE — Progress Notes (Signed)
[facility]  ? ?Reason for visit:  ?Nonhealing wound ?HPI:  ?Ileostomy ?Parastomal hernia ?ROS  ?Review of Systems  ?Constitutional:  Positive for fatigue.  ?Gastrointestinal:   ?     Parastomal hernia ?Nonhealing surgical wounds ?RLQ ileostomy  ?Musculoskeletal:  Positive for myalgias.  ?All other systems reviewed and are negative. ?Vital signs:  ?BP (!) 155/85 (BP Location: Right Arm)   Pulse 82   Temp (!) 97.4 ?F (36.3 ?C) (Oral)   Resp 20   SpO2 96%  ?Exam:  ?Physical Exam ?Vitals reviewed.  ?Abdominal:  ?   Hernia: A hernia is present.  ?   Comments: Surgical wound, nonhealing  ?Skin: ?   Comments: Surgical wound, bleeds with cleansing.  ?Was seen in ED due to bleeding, but left prior to seeing the physician.  ?Today, wound bleeds minimally with cleansing.  ?Neurological:  ?   Mental Status: He is oriented to person, place, and time.  ?  ?Stoma type/location:  RLQ ileostomy ?Stomal assessment/size:  recessed ?Peristomal assessment:  dermatitis, redness, dry cracked skin ?Treatment options for stomal/peristomal skin: barrier ring, skin prep and powder ?Output: soft brown stool ?Ostomy pouching: 1pc. ?Education provided:  None today ? ?  ?Impression/dx  ?Contact dermatitis ?Parastomal hernia ? ?Discussion  ?Continue care as ordered.  ?Plan  ?See back as needed ? ? ? ?Visit time: 45 minutes.  ? ?Domenic Moras FNP-BC ? ?  ?

## 2021-10-06 IMAGING — CT CT ABD-PELV W/ CM
3 of 11 series · 11 of 46 positions shown, 16 images · IV contrast (omnipaque)
Comparison: Chest radiographs dated 12/25/2020. CT abdomen/pelvis
dated 12/02/2019.

CLINICAL DATA: Increasing shortness of breath x2 weeks, fatigue,
infected abdominal surgical wound with redness and drainage

EXAM:
CT ANGIOGRAPHY CHEST
CT ABDOMEN AND PELVIS WITH CONTRAST
TECHNIQUE: Multidetector CT imaging of the chest was performed using the
standard protocol during bolus administration of intravenous
contrast. Multiplanar CT image reconstructions and MIPs were
obtained to evaluate the vascular anatomy. Multidetector CT imaging
of the abdomen and pelvis was performed using the standard protocol
during bolus administration of intravenous contrast.
CONTRAST:  100mL OMNIPAQUE IOHEXOL 350 MG/ML SOLN

[Series 7: pe thins · axial · 0.79mm/px · z∈[-281,-95]mm · 6 of 310 slices shown]
[im 21/310  soft-tissue]
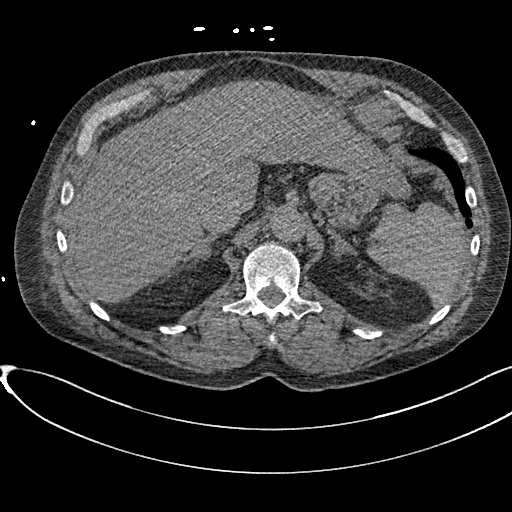
[im 62/310  soft-tissue]
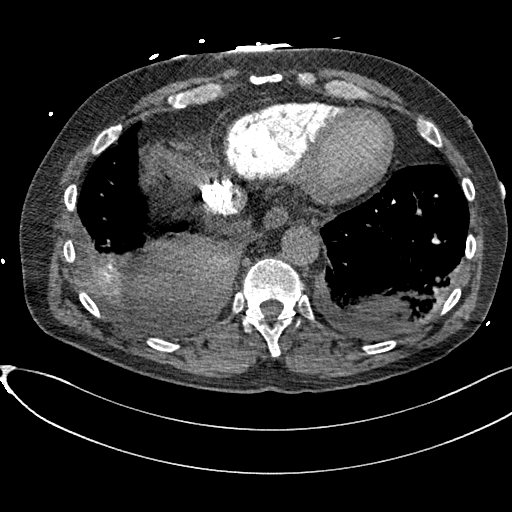
[im 104/310  soft-tissue]
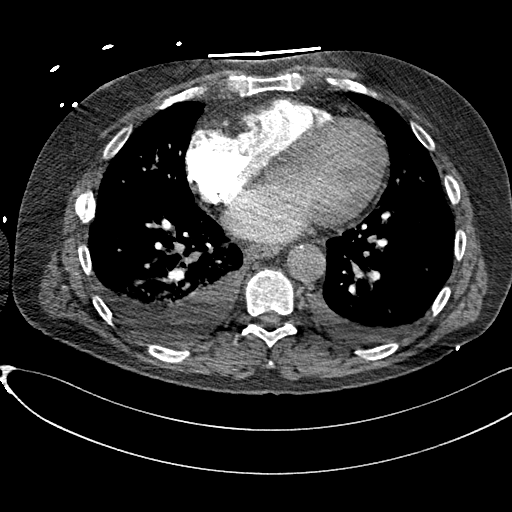
[im 145/310  soft-tissue]
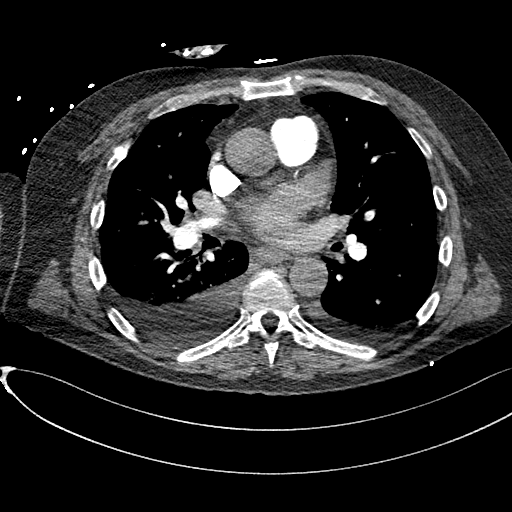
[im 165/310  soft-tissue]
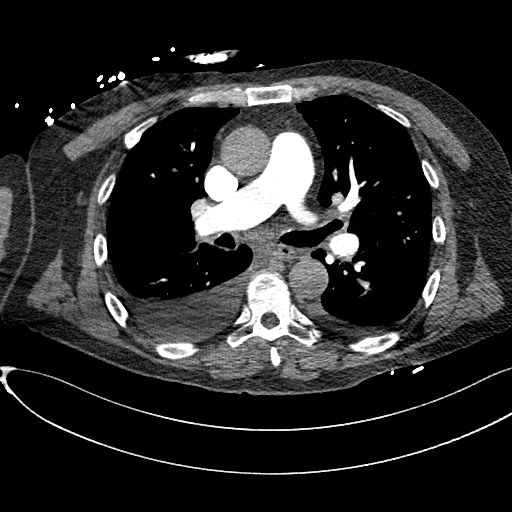
[im 207/310  soft-tissue]
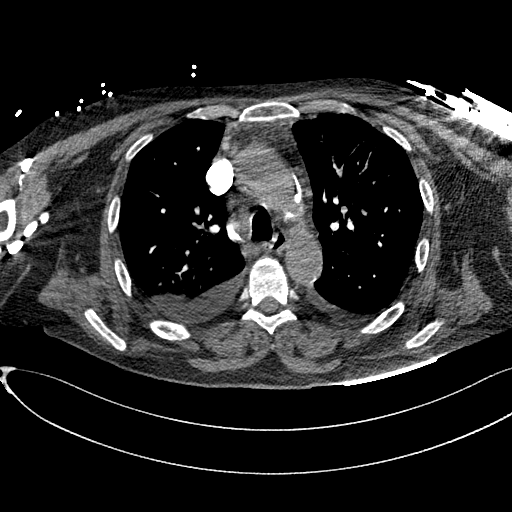

[Series 12: a/p w/ 5mm · axial · 0.93mm/px · z∈[-517,-297]mm · 3 of 88 slices shown, 7 images]
[im 22/88  soft-tissue]
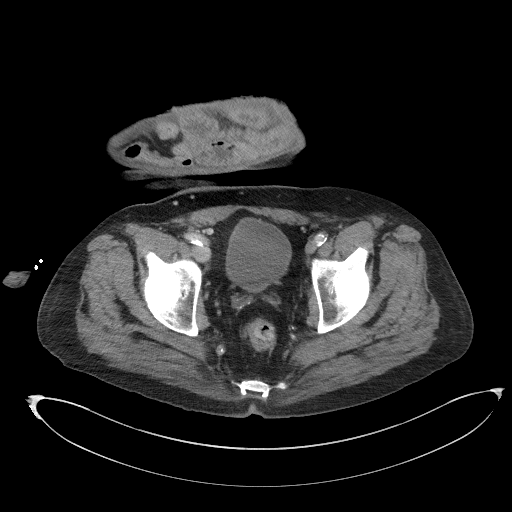
[im 22/88  lung]
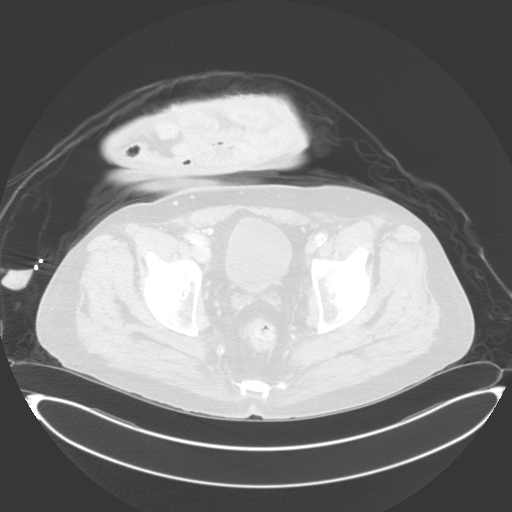
[im 22/88  bone]
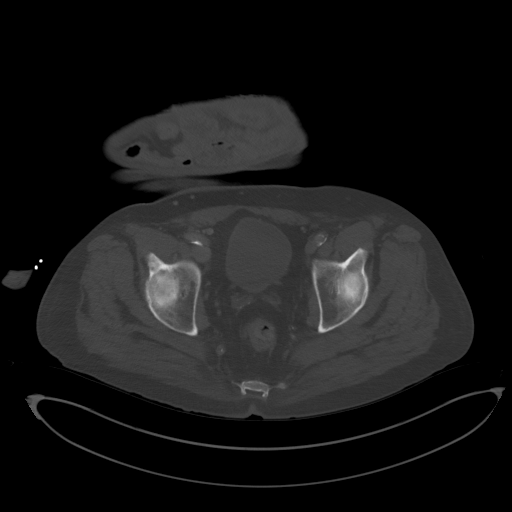
[im 44/88  soft-tissue]
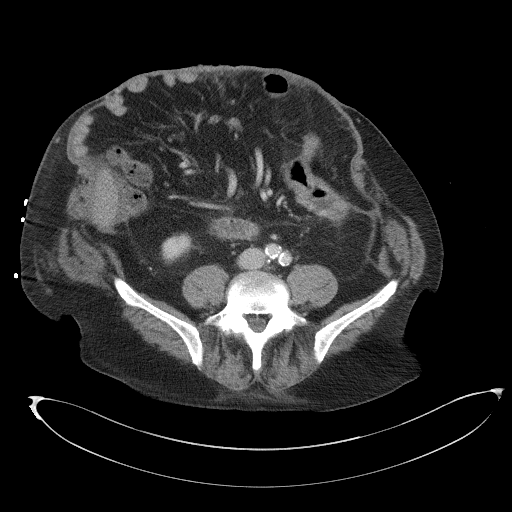
[im 44/88  lung]
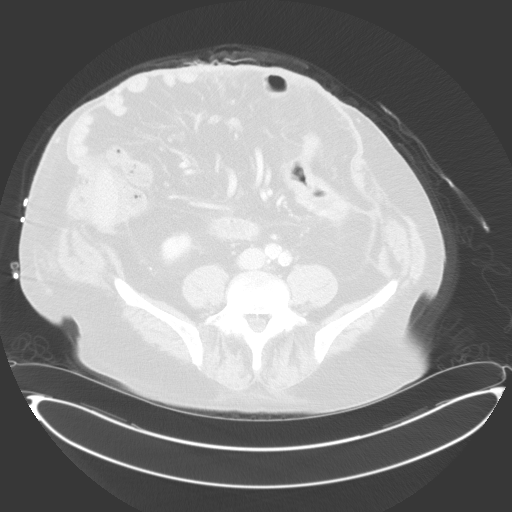
[im 66/88  soft-tissue]
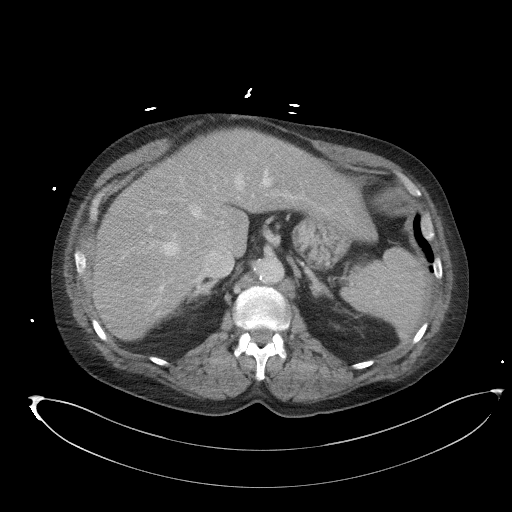
[im 66/88  lung]
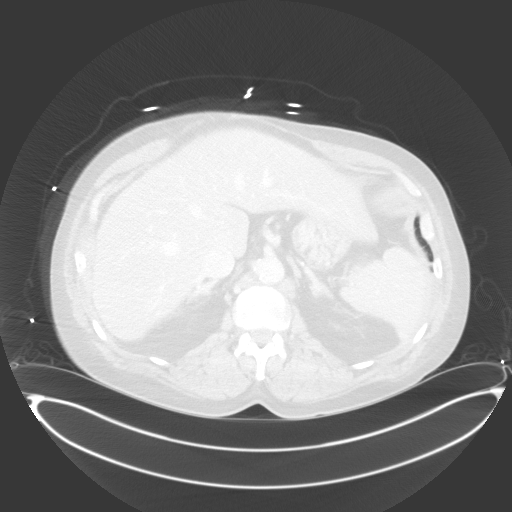

[Series 15: a/p w/ cor · coronal · 0.86mm/px · 2 of 185 slices shown, 3 images]
[im 62/185  soft-tissue]
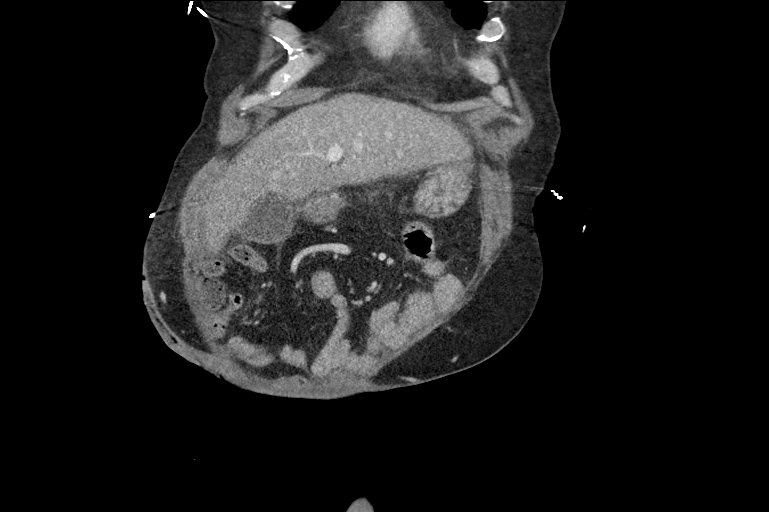
[im 62/185  bone]
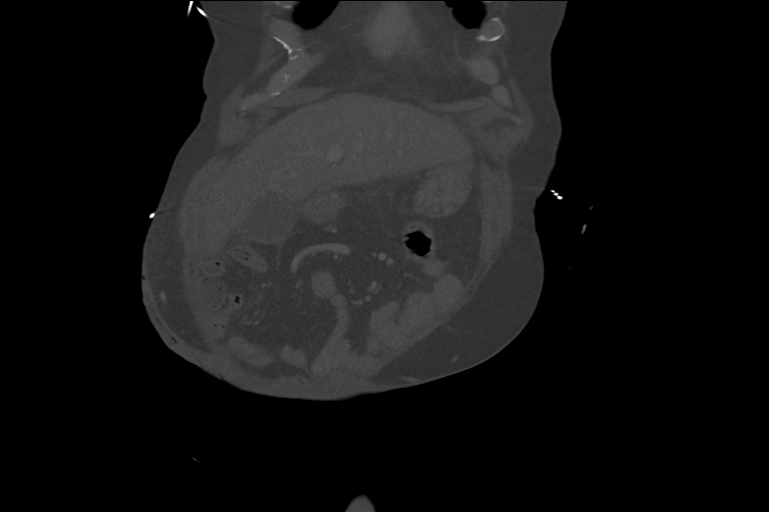
[im 123/185  soft-tissue]
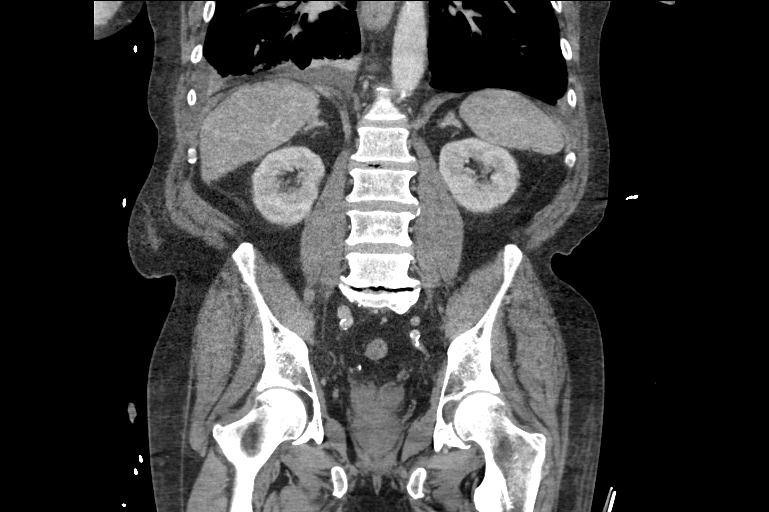

[11 of 46 positions shown; findings below may reference images not displayed]

FINDINGS: CTA CHEST FINDINGS

Cardiovascular: Satisfactory opacification the bilateral pulmonary
arteries to the lobar level. Evaluation is mildly limited due to
motion degradation. Within that constraint, there is no evidence of
pulmonary embolism.

Study is not tailored for evaluation of the thoracic aorta. No
evidence of thoracic aortic aneurysm. Atherosclerotic calcifications
of the aortic arch.

Heart is top-normal in size.  No pericardial effusion.

Mild three-vessel coronary atherosclerosis.

Mediastinum/Nodes: No suspicious mediastinal lymphadenopathy.

Visualized thyroid is unremarkable.

Lungs/Pleura: Evaluation of the lung parenchyma is constrained by
respiratory motion. Within that constraint, there are no suspicious
pulmonary nodules.

Mild interlobular septal thickening in the lungs bilaterally, lower
lobe predominant, suggesting mild interstitial edema.

No focal consolidation.

Small to moderate right and small left pleural effusions.

Associated lower lobe compressive atelectasis.

No pneumothorax.

Musculoskeletal: Very mild degenerative changes of the mid thoracic
spine.

Healing/healed right anterolateral 4th through 7th rib fracture
deformities.

Review of the MIP images confirms the above findings.

CT ABDOMEN and PELVIS FINDINGS

Motion degraded images.

Hepatobiliary: Liver is within normal limits.

Layering gallbladder sludge (series 12/image 36), without associated
inflammatory changes. No intrahepatic or extrahepatic ductal
dilatation.

Pancreas: Within normal limits.

Spleen: Within normal limits.

Adrenals/Urinary Tract: Adrenal glands are within normal limits.

Kidneys are within normal limits.  No hydronephrosis.

Stomach/Bowel: Stomach is within normal limits.

Suspected subtotal colectomy with right mid abdominal ileostomy and
Hartman's pouch.

There is frank diastasis/laxity of the anterior abdominal wall with
herniation of numerous loops of small bowel, which remain
nondilated. Persistent overlying cutaneous thickening suggests
cellulitis (series 12/image 87). Trace fluid beneath the skin
surface, but without drainable fluid collection/abscess.

Vascular/Lymphatic: No evidence of abdominal aortic aneurysm.

Atherosclerotic calcifications of the abdominal aorta and branch
vessels.

No suspicious abdominopelvic lymphadenopathy.

Reproductive: Prostate is unremarkable.

Other: Trace subcutaneous fluid, as noted above, without discrete
abdominopelvic ascites.

Musculoskeletal: Mild degenerative changes of the lumbar spine.

Review of the MIP images confirms the above findings.
IMPRESSION: No evidence of pulmonary embolism.

Mild interstitial edema. Small to moderate bilateral pleural
effusions, right greater than left. Associated lower lobe
compressive atelectasis.

Status post subtotal colectomy with right mid abdominal ileostomy.

Frank diastasis/laxity of the anterior abdominal wall with
herniation of numerous loops of nondilated small bowel. Overlying
skin thickening with trace subcutaneous fluid suggests cellulitis,
but without drainable fluid collection/abscess.

## 2021-10-12 ENCOUNTER — Ambulatory Visit (HOSPITAL_COMMUNITY)
Admission: RE | Admit: 2021-10-12 | Discharge: 2021-10-12 | Disposition: A | Discharge status: Home / Self Care | Payer: Commercial Managed Care - HMO | Source: Ambulatory Visit | Attending: Nurse Practitioner | Admitting: Nurse Practitioner

## 2021-10-12 DIAGNOSIS — T8189XA Other complications of procedures, not elsewhere classified, initial encounter: Secondary | ICD-10-CM | POA: Diagnosis not present

## 2021-10-12 DIAGNOSIS — L24B1 Irritant contact dermatitis related to digestive stoma or fistula: Secondary | ICD-10-CM

## 2021-10-12 DIAGNOSIS — L989 Disorder of the skin and subcutaneous tissue, unspecified: Secondary | ICD-10-CM | POA: Insufficient documentation

## 2021-10-12 DIAGNOSIS — Y838 Other surgical procedures as the cause of abnormal reaction of the patient, or of later complication, without mention of misadventure at the time of the procedure: Secondary | ICD-10-CM | POA: Insufficient documentation

## 2021-10-12 DIAGNOSIS — K432 Incisional hernia without obstruction or gangrene: Secondary | ICD-10-CM | POA: Diagnosis not present

## 2021-10-12 DIAGNOSIS — Z932 Ileostomy status: Secondary | ICD-10-CM | POA: Insufficient documentation

## 2021-10-12 DIAGNOSIS — K9419 Other complications of enterostomy: Secondary | ICD-10-CM | POA: Diagnosis not present

## 2021-10-12 DIAGNOSIS — K435 Parastomal hernia without obstruction or  gangrene: Secondary | ICD-10-CM | POA: Insufficient documentation

## 2021-10-12 NOTE — Discharge Instructions (Signed)
Continue same procedure  ?Order more supplies ?See in one week ?Hydrate   ?We will try binder next week ?

## 2021-10-12 NOTE — Progress Notes (Signed)
[facility]  ? ?Reason for visit:  ?Midline abdominal wound, nonhealing ?Large parastomal hernia ?RLQ ileostomy ?HPI:  ?Perforated diverticulum ?ROS  ?Review of Systems  ?Gastrointestinal:   ?     Abdominal hernia.   ?Midline abdominal surgical wound  ?All other systems reviewed and are negative. ?Vital signs:  ?BP (!) 161/94   Pulse 84   Temp 98.8 ?F (37.1 ?C)   Resp 19   SpO2 97%  ?Exam:  ?Physical Exam ?Abdominal:  ?   Hernia: A hernia is present.  ?Skin: ?   General: Skin is dry.  ?   Comments: Abdominal wound, nonhealing surgical wound  ?Neurological:  ?   Mental Status: He is alert and oriented to person, place, and time.  ?  ?Stoma type/location:  RLQ ileostomy ?Stomal assessment/size:  unchanged ?Peristomal assessment:  denuded skin  ?Treatment options for stomal/peristomal skin: Stoma powder/skin prep, barrier ring and adhesive spray ?Convex pouch ?Output: liquid brown stool ?Ostomy pouching: 1pc. ?Education provided:  No change today.  Continue triamcinolone cream to dry, cracked skin.  Irritated from tape.  Patient with dry skin today. Encouraged to drink plenty of water, admits he has not been.  Output from ileostomy is unchanged.  Urine is yellow. No dizziness. He has been taking Zyrtec for seasonal allergies as well.   ? ?  ?Impression/dx  ?Medical adhesive related skin injury ?Discussion  ?Improve hydration ?Improve skin dryness ?Plan  ?See in one week ? ? ? ?Visit time: 70 minutes.  ? ?Maple Hudson FNP-BC ? ?  ?

## 2021-10-19 ENCOUNTER — Encounter (HOSPITAL_COMMUNITY)
Admission: RE | Admit: 2021-10-19 | Discharge: 2021-10-19 | Disposition: A | Discharge status: Home / Self Care | Payer: Commercial Managed Care - HMO | Source: Ambulatory Visit | Attending: Internal Medicine | Admitting: Internal Medicine

## 2021-10-19 DIAGNOSIS — K469 Unspecified abdominal hernia without obstruction or gangrene: Secondary | ICD-10-CM | POA: Insufficient documentation

## 2021-10-19 DIAGNOSIS — Y839 Surgical procedure, unspecified as the cause of abnormal reaction of the patient, or of later complication, without mention of misadventure at the time of the procedure: Secondary | ICD-10-CM | POA: Diagnosis not present

## 2021-10-19 DIAGNOSIS — K432 Incisional hernia without obstruction or gangrene: Secondary | ICD-10-CM | POA: Diagnosis not present

## 2021-10-19 DIAGNOSIS — T8189XD Other complications of procedures, not elsewhere classified, subsequent encounter: Secondary | ICD-10-CM | POA: Diagnosis not present

## 2021-10-19 DIAGNOSIS — K9400 Colostomy complication, unspecified: Secondary | ICD-10-CM | POA: Diagnosis not present

## 2021-10-19 DIAGNOSIS — T8189XA Other complications of procedures, not elsewhere classified, initial encounter: Secondary | ICD-10-CM | POA: Diagnosis not present

## 2021-10-19 DIAGNOSIS — Z01818 Encounter for other preprocedural examination: Secondary | ICD-10-CM | POA: Insufficient documentation

## 2021-10-19 NOTE — Progress Notes (Signed)
[facility]  ? ?Reason for visit:  ?RLQ ileostomy ?Midline abdominal wound, nonhealing ?Hernia ? ?HPI:  ?Diverticulitis with perforation ?ROS  ?Review of Systems  ?Gastrointestinal:   ?     Abdominal hernia ?Nonhealing surgical wound  ?Skin:  Positive for rash and wound.  ?     Surgical wound ?Medical adhesive related skin injury to perimeter of ostomy  and wound dressings.  ?Dry skin, triamcinolone applied to dry patches on abdomen  ?All other systems reviewed and are negative. ?Vital signs:  ?BP (!) 140/96   Pulse 82   Temp 97.7 ?F (36.5 ?C)   Resp 17   SpO2 97%  ?Exam:  ?Physical Exam ?Vitals reviewed.  ?Abdominal:  ?   Hernia: A hernia is present.  ?   Comments: Nonhealing surgical wound  ?Skin: ?   Findings: Lesion and rash present.  ?Neurological:  ?   Mental Status: He is alert and oriented to person, place, and time.  ?Psychiatric:     ?   Mood and Affect: Mood normal.     ?   Behavior: Behavior normal.  ?  ?Stoma type/location:  RLQ ileostomy ?Stomal assessment/size:  1" unchanged.   ?Peristomal assessment:  unchanged  ?Treatment options for stomal/peristomal skin: creasing filled in with barrier strip, powder and skin prep.  Patient uses skin adhesive to improve pouch seal.  ?Output: soft brown stool ?Ostomy pouching: 1pc.pouch with barrier ring, skin adhesive. ?Education provided:  Discuss slow progress of wounds.  He showers weekly.  Removes dressings and cleanses with dial soap.  ? ?  ?Impression/dx  ?Hernia ?Ileostomy ?Nonhealing surgical wound ?Discussion  ?See in one week ? ?Plan  ?Continue same plan for now.  ? ? ? ?Visit time: 50 minutes.  ? ?Domenic Moras FNP-BC ? ?  ?

## 2021-10-19 NOTE — Discharge Instructions (Signed)
Continue same plan 

## 2021-10-20 ENCOUNTER — Other Ambulatory Visit: Payer: Self-pay

## 2021-10-20 NOTE — Progress Notes (Signed)
Opened in error

## 2021-10-21 ENCOUNTER — Encounter: Payer: Commercial Managed Care - HMO | Admitting: Cardiology

## 2021-10-26 ENCOUNTER — Ambulatory Visit (HOSPITAL_COMMUNITY): Payer: Commercial Managed Care - HMO | Admitting: Nurse Practitioner

## 2021-11-02 ENCOUNTER — Ambulatory Visit (HOSPITAL_COMMUNITY): Payer: Commercial Managed Care - HMO | Admitting: Nurse Practitioner

## 2021-11-05 ENCOUNTER — Ambulatory Visit (HOSPITAL_COMMUNITY): Payer: Commercial Managed Care - HMO | Admitting: Nurse Practitioner

## 2021-11-16 ENCOUNTER — Ambulatory Visit (HOSPITAL_COMMUNITY): Payer: Commercial Managed Care - HMO | Admitting: Nurse Practitioner

## 2021-11-23 ENCOUNTER — Ambulatory Visit (HOSPITAL_COMMUNITY)
Admission: RE | Admit: 2021-11-23 | Discharge: 2021-11-23 | Disposition: A | Discharge status: Home / Self Care | Payer: Commercial Managed Care - HMO | Source: Ambulatory Visit | Attending: Nurse Practitioner | Admitting: Nurse Practitioner

## 2021-11-23 DIAGNOSIS — T8189XD Other complications of procedures, not elsewhere classified, subsequent encounter: Secondary | ICD-10-CM | POA: Diagnosis not present

## 2021-11-23 DIAGNOSIS — Z432 Encounter for attention to ileostomy: Secondary | ICD-10-CM | POA: Diagnosis not present

## 2021-11-23 DIAGNOSIS — T8189XA Other complications of procedures, not elsewhere classified, initial encounter: Secondary | ICD-10-CM | POA: Diagnosis not present

## 2021-11-23 DIAGNOSIS — K9419 Other complications of enterostomy: Secondary | ICD-10-CM | POA: Diagnosis present

## 2021-11-23 DIAGNOSIS — K435 Parastomal hernia without obstruction or  gangrene: Secondary | ICD-10-CM | POA: Diagnosis not present

## 2021-11-23 DIAGNOSIS — Y838 Other surgical procedures as the cause of abnormal reaction of the patient, or of later complication, without mention of misadventure at the time of the procedure: Secondary | ICD-10-CM | POA: Insufficient documentation

## 2021-11-23 NOTE — Progress Notes (Signed)
[facility]   Reason for visit:  RLQ ileostomy HPI:  Perforated diverticulum ROS  Review of Systems Vital signs:  BP (!) 150/89   Pulse 84   Temp 98.1 F (36.7 C)   Resp 17   SpO2 96%  Exam:  Physical Exam Vitals reviewed.  Constitutional:      Appearance: Normal appearance.  Abdominal:     Hernia: A hernia is present.  Neurological:     Mental Status: He is alert and oriented to person, place, and time.     Stoma type/location:  RLQ ileostomy Stomal assessment/size:  1 3/8"  Peristomal assessment:  midline abdominal wound, parastomal hernias Treatment options for stomal/peristomal skin: WOund care, aquacel AG to open nonhealing areas.  TOp with gauze and ABD pads, tape.  Triamcinolone cream to periwound skin (dry, cracking) Output: liquid brown stool Ostomy pouching: 1pc. Education provided: none today    Impression/dx  Parastomal hernia Nonhealing surgical wound Discussion  See back in 2 weeks Plan  Appointment made    Visit time: 45 minutes.   Domenic Moras FNP-BC

## 2021-11-23 NOTE — Discharge Instructions (Signed)
Appointment made

## 2021-12-01 ENCOUNTER — Encounter: Payer: Commercial Managed Care - HMO | Admitting: Cardiology

## 2021-12-02 ENCOUNTER — Other Ambulatory Visit: Payer: Self-pay | Admitting: Student

## 2021-12-03 ENCOUNTER — Ambulatory Visit (HOSPITAL_COMMUNITY): Payer: Commercial Managed Care - HMO | Admitting: Nurse Practitioner

## 2021-12-05 NOTE — Progress Notes (Deleted)
Cardiology Office Note Date:  12/05/2021  Patient ID:  Luis [name], DOB [birth year], MRN [id number] PCP:  Georgann Housekeeper, MD  Electrophysiologist: Dr. Lalla Brothers    Chief Complaint: *** 6 mo  History of Present Illness: Luis [name] is a 61 y.o. male with history of HTN, HLD, diverticulitis (w/perforation s/p colectomy and ileostomy complicated by wound infections for the past 2 years), CHB w/PPM  He was hospitalized 12/2020 with SOB found in CHB, unfortunately he had been struggling with his abdominal wounds for 2 years. Reported Changes his wounds at home but remained with ongoing drainage of foul smelling purulence, frequent fevers/chills.  Echo 12/26/2020 LVEF 65-70% cMRI 12/28/2020 i.  Normal LV size with mild LV hypertrophy, EF 72%. ii.  Normal RV size with EF 55%.  iii.  Normal ECV percentage is not suggestive of cardiac amyloidosis. iv. LGE noted in the mid-wall basal anteroseptum and mid-wall basal inferolateral wall. This is not a coronary disease pattern, could be consistent with cardiac sarcoidosis in the appropriate clinical situation, also prior myocarditis. Would suggest high resolution CT chest to look for any evidence for pulmonary sarcoidosis. 3. High Res Chest CT 12/29/2020 without evidence of ILD (See result note for full results) He needed pacing support prior to any intervention for his abdominal wound and ultimately underwent Micra AV implant There was discussion though that with sarcoid, there may be need for ICD in the future Recommended out patient PET Discharged 01/01/21  He saw [name], PA-C 03/01/21, was feeling better, reported progress in his wound though not healed, following at [facility], not planned for surgery. He had some ectopy that could have been preventing optimal AS-VP. His Toprol increased.  Troubleshooting was done with industry support  I saw him Dec 2022 The patient tells me that since the pacemaker his breathing immediately was better and remains  improved Past that he feels awful. When asked if he has had any CP or concerns/symptoms he thinks may be his heart he gets very defensive even , say "look at me, I feel terrible, wouldn't you?!" He reports that he has been flayed open and ever since has never felt well again He denies syncope, says a month ago he fell and did not have the strength to  get up, he doesn't know whay Eventually got himself up.  Symptoms/concerns are very difficult to weed out today, he reports he feels terrible, always does and is not new.  He is very emotional about the ongoing wound and inability to heal.  He reports that he has had to cancel a number of appointments with the surgeon as he has ours because he just doesn't feel well and cancels. He has an appt with Dr. [name] (he thinks is the name) surgery next month.He is not actively getting formal wound care, he changes the dressings when needed.  Says his son is very supportive and helpful but live a ways away and is hard for him to be there all of the time He admits to smoking and drinking heavily.  When asked how much he drinks he saw "a lot", is not more specific.  He is not interested in quitting either currently. Pacer functioning appropriately 80% AM/VP EKG was abnormal with marked T changes, unclear symptoms, and discussed with DOD, planned for stress test and labs/lytes He was urged to reduce at least amount he was drinking/smoking, quitting was not on the table and re-establish with his wound care doctors  He did not get the stress test done  He called 09/30/21, sent a remote with symptoms of waking with the feeling of suffocating his assessment perhaps allergies or anxiety, device transmission noted intact device function, device RN urged he keep his follow up as scheduled with Dr. Lalla Brothers.  ER visit 10/02/21 with bleeding abdominal wound, apparently started after he pulled off the scab, notes report strong odor of alcohol by triage provider,  ultimately left without formal evaluation  He canceled his visit with Dr. Lalla Brothers in May, and no showed to the next. He has cancelled and no showed to a number of scheduled visits with the wound/ostomy clinic as well.  He saw the wound clinic 11/23/21  *** pacer function *** ETOH? Smoking? *** abnormal EKG..... ?  Device information Micra AV PPM implanted MDT 12/31/2020   Past Medical History:  Diagnosis Date   Diabetes mellitus without complication (HCC)    Diverticulitis    DVT (deep venous thrombosis) (HCC)    ETOH abuse    H/O colectomy    Hyperlipidemia    Hypertension    Smoker     Past Surgical History:  Procedure Laterality Date   COLOSTOMY     PACEMAKER LEADLESS INSERTION N/A 12/31/2020   Procedure: PACEMAKER LEADLESS INSERTION;  Surgeon: [name], MD;  Location: [facility];  Service: Cardiovascular;  Laterality: N/A;   TONSILLECTOMY      Current Outpatient Medications  Medication Sig Dispense Refill   acetaminophen (TYLENOL) 650 MG CR tablet Take 650 mg by mouth every 8 (eight) hours as needed for pain.      amLODipine (NORVASC) 10 MG tablet Take 10 mg by mouth daily.     atorvastatin (LIPITOR) 10 MG tablet Take 10 mg by mouth daily.     folic acid (FOLVITE) 1 MG tablet Take 1 tablet (1 mg total) by mouth daily. Please call [phone] to schedule an overdue appointment for future refills. Thank you. 1st attempt. 30 tablet 0   metFORMIN (GLUCOPHAGE) 1000 MG tablet Take 500 mg by mouth daily.      metoprolol succinate (TOPROL-XL) 50 MG 24 hr tablet Take 1 tablet (50 mg total) by mouth daily. 90 tablet 3   Multiple Vitamin (MULTIVITAMIN WITH MINERALS) TABS tablet Take 1 tablet by mouth every other day.      thiamine 100 MG tablet Take 1 tablet (100 mg total) by mouth daily. 30 tablet 6   traMADol (ULTRAM) 50 MG tablet Take 1 tablet (50 mg total) by mouth every 6 (six) hours as needed for moderate pain. 30 tablet 0   triamcinolone cream (KENALOG)  0.5 % as directed.     No current facility-administered medications for this visit.    Allergies:   Lisinopril and Codeine   Social History:  The patient  reports that he has been smoking cigarettes. He has a 44.00 pack-year smoking history. He uses smokeless tobacco. He reports current alcohol use. He reports current drug use. Drug: Marijuana.   Family History:  The patient's family history includes COPD in his mother; Prostate cancer in his father.  ROS:  Please see the history of present illness.    All other systems are reviewed and otherwise negative.   PHYSICAL EXAM:  VS:  There were no vitals taken for this visit. BMI: There is no height or weight on file to calculate BMI. Well nourished, well developed, in no acute distress, chronically ill appearing HEENT: normocephalic, atraumatic Neck: no JVD, carotid bruits or masses Cardiac:  RRR; no significant murmurs, no rubs,  or gallops Lungs:  bronchial BS that partially clear with cough, CTA b/l otherwise Abd: ostomy R abdomen, bandage with ACE wrap covers his abdomen MS: no deformity or  atrophy Ext: no edema Skin: warm and dry, no rash Neuro:  No gross deficits appreciated Psych: euthymic mood, full affect   EKG:  Done today and reviewed by myself shows  SRsome AS/VP (tracked beats), som sinus w/intrinsic conduction.  The intrinsic beats have marked T changes/inversion perhaps more so in the lateral leads  Device interrogation done today by industry and reviewed by myself:  Battery and electrode measurements are good AM/VP 80.4% Troubleshooting/optimization done with improve AV synchrony  Recent Labs: 12/26/2020: B Natriuretic Peptide 1,333.9 05/14/2021: Magnesium 1.8 10/02/2021: ALT 37; BUN 13; Creatinine, Ser 0.86; Hemoglobin 12.9; Platelets 246; Potassium 3.5; Sodium 137  12/26/2020: Cholesterol 140; HDL 48; LDL Cholesterol 53; Total CHOL/HDL Ratio 2.9; Triglycerides 196; VLDL 39   CrCl cannot be calculated (Patient's  most recent lab result is older than the maximum 21 days allowed.).   Wt Readings from Last 3 Encounters:  05/14/21 208 lb (94.3 kg)  03/01/21 202 lb 6.4 oz (91.8 kg)  02/15/21 197 lb (89.4 kg)     Other studies reviewed: Additional studies/records reviewed today include: summarized above  ASSESSMENT AND PLAN:  PPM *** Intact function *** % AM/VP  ? Sarcoid PET scan not yet pursued *** He has been unable to stay compliant with his appointments as above  HTN ***   4. Abnormal EKG ***   5. ETOH abuse ***  have urged the patient to cut back how much he drinks since stopping is not an option for him right now it seems  6. Abdominal wound *** Urged to see/call his PMD/surgeon and get back on track with his wound care.wound management   Disposition: ***   Current medicines are reviewed at length with the patient today.  The patient did not have any concerns regarding medicines.  Norma Fredrickson, PA-C 12/05/2021 10:30 AM     [facility] [address] [place] [address] [phone] (office)  [phone] (fax)

## 2021-12-06 ENCOUNTER — Ambulatory Visit (HOSPITAL_COMMUNITY): Payer: Commercial Managed Care - HMO | Admitting: Nurse Practitioner

## 2021-12-07 ENCOUNTER — Encounter: Payer: Commercial Managed Care - HMO | Admitting: Physician Assistant

## 2021-12-09 ENCOUNTER — Ambulatory Visit (INDEPENDENT_AMBULATORY_CARE_PROVIDER_SITE_OTHER): Payer: Commercial Managed Care - HMO | Admitting: Physician Assistant

## 2021-12-09 ENCOUNTER — Encounter: Payer: Self-pay | Admitting: Physician Assistant

## 2021-12-09 VITALS — BP 138/86 | HR 69 | Ht 73.0 in | Wt 221.8 lb

## 2021-12-09 DIAGNOSIS — Z950 Presence of cardiac pacemaker: Secondary | ICD-10-CM | POA: Diagnosis not present

## 2021-12-09 DIAGNOSIS — I10 Essential (primary) hypertension: Secondary | ICD-10-CM

## 2021-12-09 LAB — CUP PACEART INCLINIC DEVICE CHECK
Date Time Interrogation Session: 20230629163615
Implantable Pulse Generator Implant Date: 20220721

## 2021-12-09 NOTE — Progress Notes (Signed)
Cardiology Office Note Date:  12/09/2021  Patient ID:  Luis [name], DOB 1960-12-29, MRN [id number] PCP:  Georgann Housekeeper, MD  Electrophysiologist: Dr. Lalla Brothers    Chief Complaint:  6 mo  History of Present Illness: Luis [name] is a 61 y.o. male with history of HTN, HLD, diverticulitis (w/perforation s/p colectomy and ileostomy complicated by wound infections for the past 2 years), CHB w/PPM  He was hospitalized 12/2020 with SOB found in CHB, unfortunately he had been struggling with his abdominal wounds for 2 years. Reported Changes his wounds at home but remained with ongoing drainage of foul smelling purulence, frequent fevers/chills.  Echo 12/26/2020 LVEF 65-70% cMRI 12/28/2020 i.  Normal LV size with mild LV hypertrophy, EF 72%. ii.  Normal RV size with EF 55%.  iii.  Normal ECV percentage is not suggestive of cardiac amyloidosis. iv. LGE noted in the mid-wall basal anteroseptum and mid-wall basal inferolateral wall. This is not a coronary disease pattern, could be consistent with cardiac sarcoidosis in the appropriate clinical situation, also prior myocarditis. Would suggest high resolution CT chest to look for any evidence for pulmonary sarcoidosis. 3. High Res Chest CT 12/29/2020 without evidence of ILD (See result note for full results) He needed pacing support prior to any intervention for his abdominal wound and ultimately underwent Micra AV implant There was discussion though that with sarcoid, there may be need for ICD in the future Recommended out patient PET Discharged 01/01/21  He saw [name], PA-C 03/01/21, was feeling better, reported progress in his wound though not healed, following at [facility], not planned for surgery. He had some ectopy that could have been preventing optimal AS-VP. His Toprol increased.  Troubleshooting was done with industry support  I saw him Dec 2022 The patient tells me that since the pacemaker his breathing immediately was better and remains  improved Past that he feels awful. When asked if he has had any CP or concerns/symptoms he thinks may be his heart he gets very defensive even , say "look at me, I feel terrible, wouldn't you?!" He reports that he has been flayed open and ever since has never felt well again He denies syncope, says a month ago he fell and did not have the strength to  get up, he doesn't know whay Eventually got himself up.  Symptoms/concerns are very difficult to weed out today, he reports he feels terrible, always does and is not new.  He is very emotional about the ongoing wound and inability to heal.  He reports that he has had to cancel a number of appointments with the surgeon as he has ours because he just doesn't feel well and cancels. He has an appt with Dr. [name] (he thinks is the name) surgery next month.He is not actively getting formal wound care, he changes the dressings when needed.  Says his son is very supportive and helpful but live a ways away and is hard for him to be there all of the time He admits to smoking and drinking heavily.  When asked how much he drinks he saw "a lot", is not more specific.  He is not interested in quitting either currently. Pacer functioning appropriately 80% AM/VP EKG was abnormal with marked T changes, unclear symptoms, and discussed with DOD, planned for stress test and labs/lytes He was urged to reduce at least amount he was drinking/smoking, quitting was not on the table and re-establish with his wound care doctors  He did not get the stress test done  He called 09/30/21, sent a remote with symptoms of waking with the feeling of suffocating his assessment perhaps allergies or anxiety, device transmission noted intact device function, device RN urged he keep his follow up as scheduled with Dr. Lalla Brothers.  ER visit 10/02/21 with bleeding abdominal wound, apparently started after he pulled off the scab, notes report strong odor of alcohol by triage provider,  ultimately left without formal evaluation  He canceled his visit with Dr. Lalla Brothers in May, and no showed to the next. He has cancelled and no showed to a number of scheduled visits with the wound/ostomy clinic as well.  He saw the wound clinic 11/23/21  TODAY He reports last week on his way here suddenly started to feel poorly, chills, felt ill. Spent the day in bed. The next day he developed blood in his urine thinks he passed a kidney stone and then felt better and has since then. No CP, some SOB that is chronic No fainting or near fainting Of course belly never feels well Seeing wound clinic, says the person he saw last was wonderful and is encouraged that she will get him where he needs to be in hopes of a colostomy reversal  Device information Micra AV PPM implanted MDT 12/31/2020   Past Medical History:  Diagnosis Date   Diabetes mellitus without complication (HCC)    Diverticulitis    DVT (deep venous thrombosis) (HCC)    ETOH abuse    H/O colectomy    Hyperlipidemia    Hypertension    Smoker     Past Surgical History:  Procedure Laterality Date   COLOSTOMY     PACEMAKER LEADLESS INSERTION N/A 12/31/2020   Procedure: PACEMAKER LEADLESS INSERTION;  Surgeon: [name], MD;  Location: [facility];  Service: Cardiovascular;  Laterality: N/A;   TONSILLECTOMY      Current Outpatient Medications  Medication Sig Dispense Refill   acetaminophen (TYLENOL) 650 MG CR tablet Take 650 mg by mouth every 8 (eight) hours as needed for pain.      amLODipine (NORVASC) 10 MG tablet Take 10 mg by mouth daily.     atorvastatin (LIPITOR) 10 MG tablet Take 10 mg by mouth daily.     folic acid (FOLVITE) 1 MG tablet Take 1 tablet (1 mg total) by mouth daily. Please call [phone] to schedule an overdue appointment for future refills. Thank you. 1st attempt. 30 tablet 0   metFORMIN (GLUCOPHAGE) 1000 MG tablet Take 500 mg by mouth daily.      metoprolol succinate (TOPROL-XL)  50 MG 24 hr tablet Take 1 tablet (50 mg total) by mouth daily. 90 tablet 3   Multiple Vitamin (MULTIVITAMIN WITH MINERALS) TABS tablet Take 1 tablet by mouth every other day.      thiamine 100 MG tablet Take 1 tablet (100 mg total) by mouth daily. 30 tablet 6   traMADol (ULTRAM) 50 MG tablet Take 1 tablet (50 mg total) by mouth every 6 (six) hours as needed for moderate pain. 30 tablet 0   triamcinolone cream (KENALOG) 0.5 % as directed.     No current facility-administered medications for this visit.    Allergies:   Lisinopril and Codeine   Social History:  The patient  reports that he has been smoking cigarettes. He has a 44.00 pack-year smoking history. He uses smokeless tobacco. He reports current alcohol use. He reports current drug use. Drug: Marijuana.   Family History:  The patient's family history includes COPD in his  mother; Prostate cancer in his father.  ROS:  Please see the history of present illness.    All other systems are reviewed and otherwise negative.   PHYSICAL EXAM:  VS:  There were no vitals taken for this visit. BMI: There is no height or weight on file to calculate BMI. Well nourished, well developed, in no acute distress, chronically ill appearing HEENT: normocephalic, atraumatic Neck: no JVD, carotid bruits or masses Cardiac:  RRR; no significant murmurs, no rubs, or gallops Lungs:  CTA b/l today Abd: ostomy R abdomen, bandage with ACE wrap covers his abdomen MS: no deformity or  atrophy Ext: no edema Skin: warm and dry, no rash Neuro:  No gross deficits appreciated Psych: euthymic mood, full affect   EKG:  not done today   Device interrogation done today by industry and reviewed by myself:  Battery and electrode measurements are good Battery and electrode measurements are good AM/VP70.2% VP only 24.8% A4 subthreshold and adjusted with clear improvement in AV synchrony He has fairly frequent PACs  Recent Labs: 12/26/2020: B Natriuretic Peptide  1,333.9 05/14/2021: Magnesium 1.8 10/02/2021: ALT 37; BUN 13; Creatinine, Ser 0.86; Hemoglobin 12.9; Platelets 246; Potassium 3.5; Sodium 137  12/26/2020: Cholesterol 140; HDL 48; LDL Cholesterol 53; Total CHOL/HDL Ratio 2.9; Triglycerides 196; VLDL 39   CrCl cannot be calculated (Patient's most recent lab result is older than the maximum 21 days allowed.).   Wt Readings from Last 3 Encounters:  05/14/21 208 lb (94.3 kg)  03/01/21 202 lb 6.4 oz (91.8 kg)  02/15/21 197 lb (89.4 kg)     Other studies reviewed: Additional studies/records reviewed today include: summarized above  ASSESSMENT AND PLAN:  PPM Intact function 70 % AM/VP Programmed as above  ? Sarcoid PET scan not yet pursued He has been unable to stay compliant with his appointments as above Going to [facility] for a PET is not an option for him We will pursue once our [facility] program is up and running  HTN No changes today   4. ETOH abuse We have previously  have urged the patient to cut back how much he drinks since stopping has not an option for him  Advised to try and quit smoking as well.  6. Abdominal wound He is back on board with wound center   Disposition: we can see him back in 92mo, sooner if needed     Current medicines are reviewed at length with the patient today.  The patient did not have any concerns regarding medicines.  Norma Fredrickson, PA-C 12/09/2021 4:36 AM     [facility] [address] [place] [address] [phone] (office)  [phone] (fax)

## 2021-12-09 NOTE — Patient Instructions (Signed)
Medication Instructions:  Your physician recommends that you continue on your current medications as directed. Please refer to the Current Medication list given to you today.  *If you need a refill on your cardiac medications before your next appointment, please call your pharmacy*   Lab Work: None If you have labs (blood work) drawn today and your tests are completely normal, you will receive your results only by: MyChart Message (if you have MyChart) OR A paper copy in the mail If you have any lab test that is abnormal or we need to change your treatment, we will call you to review the results.   Follow-Up: At [facility], you and your health needs are our priority.  As part of our continuing mission to provide you with exceptional heart care, we have created designated Provider Care Teams.  These Care Teams include your primary Cardiologist (physician) and Advanced Practice Providers (APPs -  Physician Assistants and Nurse Practitioners) who all work together to provide you with the care you need, when you need it.  We recommend signing up for the patient portal called "MyChart".  Sign up information is provided on this After Visit Summary.  MyChart is used to connect with patients for Virtual Visits (Telemedicine).  Patients are able to view lab/test results, encounter notes, upcoming appointments, etc.  Non-urgent messages can be sent to your provider as well.   To learn more about what you can do with MyChart, go to [email or web address].    Your next appointment:   6 month(s)  The format for your next appointment:   In Person  Provider:   Cameron Lambert, MD{   

## 2021-12-10 ENCOUNTER — Encounter (HOSPITAL_COMMUNITY)
Admission: RE | Admit: 2021-12-10 | Discharge: 2021-12-10 | Disposition: A | Discharge status: Home / Self Care | Payer: Commercial Managed Care - HMO | Source: Ambulatory Visit | Attending: Internal Medicine | Admitting: Internal Medicine

## 2021-12-10 DIAGNOSIS — T8189XD Other complications of procedures, not elsewhere classified, subsequent encounter: Secondary | ICD-10-CM | POA: Diagnosis not present

## 2021-12-10 DIAGNOSIS — L24B3 Irritant contact dermatitis related to fecal or urinary stoma or fistula: Secondary | ICD-10-CM

## 2021-12-10 DIAGNOSIS — T8189XA Other complications of procedures, not elsewhere classified, initial encounter: Secondary | ICD-10-CM | POA: Diagnosis present

## 2021-12-10 DIAGNOSIS — K435 Parastomal hernia without obstruction or  gangrene: Secondary | ICD-10-CM | POA: Diagnosis present

## 2021-12-10 DIAGNOSIS — K9419 Other complications of enterostomy: Secondary | ICD-10-CM | POA: Diagnosis not present

## 2021-12-10 NOTE — Progress Notes (Signed)
[facility]   Reason for visit:  RLQ ileostomy, midline abdominal wound, nonhealing HPI:  Perforated diverticulum, nonhealing surgical wound, parastomal hernia ROS  Review of Systems Vital signs:  There were no vitals taken for this visit. Exam:  Physical Exam  Stoma type/location:  *** Stomal assessment/size:  *** Peristomal assessment:  *** Treatment options for stomal/peristomal skin: *** Output: *** Ostomy pouching: 1pc./2pc.  Education provided:  ***    Impression/dx  *** Discussion  *** Plan  ***    Visit time: *** minutes.   Maple Hudson FNP-BC

## 2021-12-13 NOTE — Discharge Instructions (Signed)
See back in 7-10 days to evaluate wound healing.

## 2021-12-17 ENCOUNTER — Ambulatory Visit (HOSPITAL_COMMUNITY)
Admission: RE | Admit: 2021-12-17 | Discharge: 2021-12-17 | Disposition: A | Discharge status: Home / Self Care | Payer: Commercial Managed Care - HMO | Source: Ambulatory Visit | Attending: Nurse Practitioner | Admitting: Nurse Practitioner

## 2021-12-17 ENCOUNTER — Encounter (HOSPITAL_COMMUNITY): Payer: Self-pay | Admitting: Nurse Practitioner

## 2021-12-17 NOTE — Progress Notes (Signed)
[facility]   Reason for visit:  RLQ  ileostomy  abdominal wound parastomal hernia HPI:  Perforated diverticulum with nonhealing wounds to abdomen Large parastomal hernia ROS  Review of Systems  Gastrointestinal:        RLQ ileostomy Parastomal hernia Nonhealing wounds to abdomen, surgical  Skin:  Positive for rash and wound.       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  Apply thin layer of trimacinolone  Psychiatric/Behavioral: Negative.    All other systems reviewed and are negative.  Vital signs:  BP (!) 150/86 (BP Location: Right Arm)   Pulse 92   Temp 97.9 F (36.6 C) (Oral)   Resp 18   SpO2 97%  Exam:  Physical Exam Vitals reviewed.  Constitutional:      Appearance: Normal appearance.  Abdominal:     Hernia: A hernia is present.     Comments: Midline abdominal surgical wounds Parastomal hernia, displaces abdomen. (Umbilicus in RLQ)   Skin:    Findings: Lesion and rash present.  Neurological:     Mental Status: He is alert and oriented to person, place, and time.     Stoma type/location:  RLQ ileostomy Stomal assessment/size:  1" flush Peristomal assessment:  skin intact, creasing at 3 o'clock (this is where umbilicus was, hernia has altered abdominal plane) Treatment options for stomal/peristomal skin: paste, ring, 1 piece pouch Output: liquid brown stool Ostomy pouching: 1pc.convex with barrier ring Education provided:  continue pouching, shower to coincide with pouch changes    Impression/dx  Dermatitis, chronic from adhesive added to secure pouch and wound dressings.  Discussion  Continue same plan of care for now. We have been applying silver to abdominal wounds.  He is interested in a possible referral to the wound center for skin grafting.  Plan  See back next week.      Visit time: 45 minutes.   Maple Hudson FNP-BC

## 2021-12-17 NOTE — Discharge Instructions (Signed)
Considering consult to wound center/possible skin graft

## 2021-12-21 ENCOUNTER — Ambulatory Visit (HOSPITAL_COMMUNITY)
Admission: RE | Admit: 2021-12-21 | Discharge: 2021-12-21 | Disposition: A | Discharge status: Home / Self Care | Payer: Commercial Managed Care - HMO | Source: Ambulatory Visit | Attending: Nurse Practitioner | Admitting: Nurse Practitioner

## 2021-12-21 DIAGNOSIS — K9413 Enterostomy malfunction: Secondary | ICD-10-CM

## 2021-12-21 DIAGNOSIS — T8189XD Other complications of procedures, not elsewhere classified, subsequent encounter: Secondary | ICD-10-CM | POA: Diagnosis not present

## 2021-12-21 DIAGNOSIS — Z432 Encounter for attention to ileostomy: Secondary | ICD-10-CM | POA: Diagnosis present

## 2021-12-21 DIAGNOSIS — K432 Incisional hernia without obstruction or gangrene: Secondary | ICD-10-CM

## 2021-12-21 DIAGNOSIS — L24B3 Irritant contact dermatitis related to fecal or urinary stoma or fistula: Secondary | ICD-10-CM

## 2021-12-21 DIAGNOSIS — K435 Parastomal hernia without obstruction or  gangrene: Secondary | ICD-10-CM | POA: Insufficient documentation

## 2021-12-21 DIAGNOSIS — T8189XA Other complications of procedures, not elsewhere classified, initial encounter: Secondary | ICD-10-CM | POA: Insufficient documentation

## 2021-12-21 NOTE — Progress Notes (Signed)
[facility]   Reason for visit:  RLQ ileostomy Parastomal hernia Nonhealing surgical wound to midline abdomen HPI:  Perforated diverticulum with ileostomy ROS  Review of Systems  Gastrointestinal:        Ileostomy hernia  Skin:  Positive for color change, rash and wound.  All other systems reviewed and are negative.  Vital signs:  There were no vitals taken for this visit. Exam:  Physical Exam Constitutional:      Appearance: He is obese.  Abdominal:     Hernia: A hernia is present.  Skin:    Findings: Erythema, lesion and rash present.  Neurological:     Mental Status: He is alert and oriented to person, place, and time.  Psychiatric:        Mood and Affect: Mood normal.     Stoma type/location:  RLQ ileostomy with deep creasing at 3 o'clock Stomal assessment/size:  1 " flush Peristomal assessment:  creasing at 3 o'clock Treatment options for stomal/peristomal skin: paste to crease and barrier ring Skin irritation to perimeter from chronic use of additional adhesives Pouching with 1 piece convex, barrier ring with skin adhesive.  Output: soft brown stool Ostomy pouching: 1pc  Education provided:  adding silver hydrofiber to skin breakdown.  Area is weeping and tender Midline wound is unchanged (see photo) and we will continue silver as this manages drainage and bio burden Top with gauze and ABD pads and tape.  Triamcinolone to periwound skin irritation from chronic medical adhesive related skin injury (MARSI) Large abdominal hernia supported with ace bandages wrapped around abdomen for support.   We are working with [facility] laboratories for a custom hernia support belt that can be easily cleaned due to oozing from midline wound.     Impression/dx  MARSI to periwound and peristomal periphery Contact dermatitis Hernia Ileostomy  Discussion  Would like to pursue skin grafting.  HAs not made any lifestyle modifications needed.  Continues to smoke and  consumes alcohol daily.  Plan  See back. Pursue hernia support for comfort Goal of wound healing and ostomy reversal with hernia repair.     Visit time: 65 minutes.   Maple Hudson FNP-BC

## 2021-12-22 NOTE — Discharge Instructions (Signed)
Aquacel to open wounds Working on improved hernia support

## 2021-12-24 ENCOUNTER — Encounter (HOSPITAL_COMMUNITY): Payer: Self-pay | Admitting: Nurse Practitioner

## 2021-12-28 ENCOUNTER — Ambulatory Visit (HOSPITAL_COMMUNITY)
Admission: RE | Admit: 2021-12-28 | Discharge: 2021-12-28 | Disposition: A | Discharge status: Home / Self Care | Payer: Commercial Managed Care - HMO | Source: Ambulatory Visit | Attending: Nurse Practitioner | Admitting: Nurse Practitioner

## 2021-12-28 DIAGNOSIS — F17200 Nicotine dependence, unspecified, uncomplicated: Secondary | ICD-10-CM | POA: Insufficient documentation

## 2021-12-28 DIAGNOSIS — T148XXA Other injury of unspecified body region, initial encounter: Secondary | ICD-10-CM | POA: Diagnosis not present

## 2021-12-28 DIAGNOSIS — Z432 Encounter for attention to ileostomy: Secondary | ICD-10-CM | POA: Diagnosis not present

## 2021-12-28 DIAGNOSIS — K9419 Other complications of enterostomy: Secondary | ICD-10-CM | POA: Insufficient documentation

## 2021-12-28 DIAGNOSIS — K432 Incisional hernia without obstruction or gangrene: Secondary | ICD-10-CM

## 2021-12-28 DIAGNOSIS — K435 Parastomal hernia without obstruction or  gangrene: Secondary | ICD-10-CM | POA: Diagnosis not present

## 2021-12-28 DIAGNOSIS — Y838 Other surgical procedures as the cause of abnormal reaction of the patient, or of later complication, without mention of misadventure at the time of the procedure: Secondary | ICD-10-CM | POA: Insufficient documentation

## 2021-12-28 DIAGNOSIS — L249 Irritant contact dermatitis, unspecified cause: Secondary | ICD-10-CM | POA: Diagnosis not present

## 2021-12-28 DIAGNOSIS — Z932 Ileostomy status: Secondary | ICD-10-CM | POA: Diagnosis present

## 2021-12-28 DIAGNOSIS — L24B3 Irritant contact dermatitis related to fecal or urinary stoma or fistula: Secondary | ICD-10-CM | POA: Diagnosis not present

## 2021-12-28 DIAGNOSIS — E669 Obesity, unspecified: Secondary | ICD-10-CM | POA: Insufficient documentation

## 2021-12-28 NOTE — Discharge Instructions (Signed)
I will follow up with [facility] I will establish contact with [employer] for supplies Work towards smoking and drinking cessation.

## 2021-12-28 NOTE — Progress Notes (Signed)
[facility]   Reason for visit:  RLQ ileostomy Parastomal hernia Midline abdominal wound, nonhealing HPI:  Perforated diverticulum ROS  Review of Systems  Gastrointestinal:        RLQ ileostomy Parastomal hernia  Skin:  Positive for rash.       Periwound and peristomal skin breakdown  All other systems reviewed and are negative.  Vital signs:  BP (!) 146/83 (BP Location: Right Arm)   Pulse 95   Temp 98.2 F (36.8 C) (Oral)   Resp 18   SpO2 96%  Exam:  Physical Exam Constitutional:      Appearance: He is obese.  Abdominal:     Hernia: A hernia is present.  Skin:    Comments: Breakdown to peristomal and periwound skin  Bleeding in wound bed, applied silver nitrate     Stoma type/location:  RLQ ileostomy, creasing at 3 o'clock Stomal assessment/size:  pink and moist Peristomal assessment:  chronic irritant dermatitis Treatment options for stomal/peristomal skin: barrier ring convex pouch Output: soft brown stool Ostomy pouching: 1pc.convex pouch Education provided:  WOund care performed to abdomen.  2 areas begin to bleed.  I applied silver nitrate to the areas.  They darkened and stopped bleeding  cleansed and covered with aquacel gauze and ABD pads/tape.  Triamcinolone cream to periwound skin    Impression/dx  Contact dermatitis Nonhealing surgical wound Hernia ileostomy Discussion  I measure his abdomen and am researching hernia support belts with [employer] company  (56 " at largest girth)  Plan  WE discuss barriers to surgery and further treatment.  He needs to stop smoking, drinking.  He agrees that it is time and he is going to try and quit.  His insurance has now switched to Medicaid and he has to get established with a new company for wound and ostomy supplies.     Visit time: 60 minutes.   Maple Hudson FNP-BC

## 2022-01-03 ENCOUNTER — Ambulatory Visit (HOSPITAL_COMMUNITY): Payer: Commercial Managed Care - HMO | Admitting: Nurse Practitioner

## 2022-01-03 ENCOUNTER — Ambulatory Visit (INDEPENDENT_AMBULATORY_CARE_PROVIDER_SITE_OTHER): Payer: 59

## 2022-01-03 DIAGNOSIS — I442 Atrioventricular block, complete: Secondary | ICD-10-CM

## 2022-01-04 ENCOUNTER — Encounter (HOSPITAL_COMMUNITY): Payer: Self-pay | Admitting: Nurse Practitioner

## 2022-01-05 LAB — CUP PACEART REMOTE DEVICE CHECK
Battery Remaining Longevity: 96 mo
Battery Voltage: 3.02 V
Brady Statistic AS VP Percent: 98.22 %
Brady Statistic AS VS Percent: 0.03 %
Brady Statistic RV Percent Paced: 99.79 %
Date Time Interrogation Session: 20230726133156
Implantable Pulse Generator Implant Date: 20220721
Lead Channel Impedance Value: 560 Ohm
Lead Channel Pacing Threshold Amplitude: 0.375 V
Lead Channel Pacing Threshold Pulse Width: 0.24 ms
Lead Channel Sensing Intrinsic Amplitude: 3.713 mV
Lead Channel Setting Pacing Amplitude: 0.875
Lead Channel Setting Pacing Pulse Width: 0.24 ms
Lead Channel Setting Sensing Sensitivity: 2 mV

## 2022-01-07 ENCOUNTER — Ambulatory Visit (HOSPITAL_COMMUNITY): Payer: Commercial Managed Care - HMO | Admitting: Nurse Practitioner

## 2022-01-07 ENCOUNTER — Other Ambulatory Visit: Payer: Self-pay | Admitting: Student

## 2022-01-17 ENCOUNTER — Ambulatory Visit (HOSPITAL_COMMUNITY): Payer: Commercial Managed Care - HMO | Admitting: Nurse Practitioner

## 2022-01-18 ENCOUNTER — Encounter (HOSPITAL_COMMUNITY)
Admission: RE | Admit: 2022-01-18 | Discharge: 2022-01-18 | Disposition: A | Discharge status: Home / Self Care | Payer: Commercial Managed Care - HMO | Source: Ambulatory Visit | Attending: Nurse Practitioner | Admitting: Nurse Practitioner

## 2022-01-18 DIAGNOSIS — L259 Unspecified contact dermatitis, unspecified cause: Secondary | ICD-10-CM | POA: Diagnosis present

## 2022-01-18 DIAGNOSIS — T8189XD Other complications of procedures, not elsewhere classified, subsequent encounter: Secondary | ICD-10-CM

## 2022-01-18 DIAGNOSIS — L24B3 Irritant contact dermatitis related to fecal or urinary stoma or fistula: Secondary | ICD-10-CM | POA: Diagnosis not present

## 2022-01-18 DIAGNOSIS — Z432 Encounter for attention to ileostomy: Secondary | ICD-10-CM | POA: Insufficient documentation

## 2022-01-18 DIAGNOSIS — K9413 Enterostomy malfunction: Secondary | ICD-10-CM | POA: Diagnosis not present

## 2022-01-18 DIAGNOSIS — K435 Parastomal hernia without obstruction or  gangrene: Secondary | ICD-10-CM | POA: Diagnosis not present

## 2022-01-18 DIAGNOSIS — T8189XA Other complications of procedures, not elsewhere classified, initial encounter: Secondary | ICD-10-CM | POA: Diagnosis not present

## 2022-01-18 NOTE — Progress Notes (Signed)
[facility]   Reason for visit:  RLQ ileostomy Parastomal hernia Nonhealing surgical wound HPI:  Perforated diverticulum with ileostomy ROS  Review of Systems  Gastrointestinal:        RLQ ileostomy Dermatitis to peristomal skin and abdominal periwound skin  Skin:  Positive for rash and wound.  All other systems reviewed and are negative.  Vital signs:  BP (!) 142/85 (BP Location: Right Arm)   Pulse 94   Temp 98 F (36.7 C) (Oral)   Resp 18   SpO2 97%  Exam:  Physical Exam Constitutional:      Appearance: He is obese.  Abdominal:     Hernia: A hernia is present.  Skin:    Comments: Abdominal wound with periwound breakdown Peristomal breakdown  Neurological:     Mental Status: He is alert.     Stoma type/location:  RLQ ileostomy Stomal assessment/size:  1 " flush Peristomal assessment:  chronic denuded skin Treatment options for stomal/peristomal skin: triamcinolone cream, stoma powder skin prep and skin adhesive used.  Output: liquid tan effluent Ostomy pouching: 1pc. Flat Uses ace wrap around abdomen for support of hernia and ostomy Changed ostomy pouch and performed wound care.  Secured with ace wrap Education provided:  Patient has been discharged from his PCP practice.  He brings in certified letter  I encourage him to call [facility] referral line (number is on the letter)      Impression/dx  Contact dermatitis Ileostomy Parastomal hernia  Discussion  Encouraged to follow up and find new physician  He inquires about skin grafting to abdominal wounds.  I inform him of lifestyle changes he will need to make (drinking, smoking)  he agrees to try,  Plan  See back in one week to monitor peristomal breakdown and wound healing.     Visit time: 40 minutes.   Maple Hudson FNP-BC

## 2022-01-19 NOTE — Discharge Instructions (Signed)
Pursue new PCP  Use physician referral number on your letter.

## 2022-01-24 ENCOUNTER — Other Ambulatory Visit (HOSPITAL_COMMUNITY): Payer: Self-pay | Admitting: Nurse Practitioner

## 2022-01-24 DIAGNOSIS — L24B3 Irritant contact dermatitis related to fecal or urinary stoma or fistula: Secondary | ICD-10-CM

## 2022-01-24 DIAGNOSIS — T148XXA Other injury of unspecified body region, initial encounter: Secondary | ICD-10-CM

## 2022-01-24 DIAGNOSIS — K432 Incisional hernia without obstruction or gangrene: Secondary | ICD-10-CM

## 2022-01-25 ENCOUNTER — Encounter (HOSPITAL_COMMUNITY)
Admission: RE | Admit: 2022-01-25 | Discharge: 2022-01-25 | Disposition: A | Discharge status: Home / Self Care | Payer: Commercial Managed Care - HMO | Source: Ambulatory Visit | Attending: Internal Medicine | Admitting: Internal Medicine

## 2022-01-25 DIAGNOSIS — Z432 Encounter for attention to ileostomy: Secondary | ICD-10-CM

## 2022-01-25 DIAGNOSIS — L24B3 Irritant contact dermatitis related to fecal or urinary stoma or fistula: Secondary | ICD-10-CM

## 2022-01-25 DIAGNOSIS — Z01812 Encounter for preprocedural laboratory examination: Secondary | ICD-10-CM | POA: Diagnosis present

## 2022-01-25 DIAGNOSIS — T148XXA Other injury of unspecified body region, initial encounter: Secondary | ICD-10-CM | POA: Diagnosis not present

## 2022-01-25 DIAGNOSIS — K432 Incisional hernia without obstruction or gangrene: Secondary | ICD-10-CM | POA: Diagnosis not present

## 2022-01-25 MED ORDER — SILVER NITRATE-POT NITRATE 75-25 % EX MISC
1.0000 | Freq: Once | CUTANEOUS | Status: DC
  Filled 2022-01-25: qty 1

## 2022-01-25 NOTE — Progress Notes (Signed)
[facility]   Reason for visit:  Midline abdominal wound, nonhealing RLQ ileostomy Abdominal hernia HPI:  Perforated diverticulum with RLQ ileosotmy ROS  Review of Systems  Gastrointestinal:        RLQ ileostomy Parastomal hernia Nonhealing surgical wound  Skin:  Positive for rash.       Contact dermatitis  All other systems reviewed and are negative.  Vital signs:  There were no vitals taken for this visit. Exam:  Physical Exam Constitutional:      Appearance: He is obese.  Cardiovascular:     Comments: Pacemaker  Abdominal:     Hernia: A hernia is present.     Comments: RLQ ileostomy  Neurological:     Mental Status: He is alert and oriented to person, place, and time.  Psychiatric:        Mood and Affect: Mood normal.        Behavior: Behavior normal.     Stoma type/location:  RLQ ileostomy Stomal assessment/size:  1 3/8" pink and moist, flush Peristomal assessment:  creasing at 3 o'clock  Treatment options for stomal/peristomal skin: paste, barrier ring and 1 piece pouch. Output: soft yellow stool Ostomy pouching: 1pc. Education provided:  none today    Impression/dx  Hernia Ileostomy Nonhealing surgical wound to abdomen Discussion  Supply provider has changed to [employer].  I send in new script for supplies Plan  See back next week     Visit time: 50 minutes.   Maple Hudson FNP-BC

## 2022-01-27 NOTE — Discharge Instructions (Signed)
[employer] for supplies

## 2022-02-01 ENCOUNTER — Encounter (HOSPITAL_COMMUNITY)
Admission: RE | Admit: 2022-02-01 | Discharge: 2022-02-01 | Disposition: A | Discharge status: Home / Self Care | Payer: Medicaid Other | Source: Ambulatory Visit | Attending: Nurse Practitioner | Admitting: Nurse Practitioner

## 2022-02-01 DIAGNOSIS — T148XXA Other injury of unspecified body region, initial encounter: Secondary | ICD-10-CM

## 2022-02-01 DIAGNOSIS — L258 Unspecified contact dermatitis due to other agents: Secondary | ICD-10-CM | POA: Insufficient documentation

## 2022-02-01 DIAGNOSIS — K9419 Other complications of enterostomy: Secondary | ICD-10-CM | POA: Insufficient documentation

## 2022-02-01 DIAGNOSIS — Z432 Encounter for attention to ileostomy: Secondary | ICD-10-CM | POA: Diagnosis not present

## 2022-02-01 DIAGNOSIS — K432 Incisional hernia without obstruction or gangrene: Secondary | ICD-10-CM | POA: Diagnosis not present

## 2022-02-01 DIAGNOSIS — L24B3 Irritant contact dermatitis related to fecal or urinary stoma or fistula: Secondary | ICD-10-CM

## 2022-02-01 NOTE — Progress Notes (Signed)
[facility]   Reason for visit:  RLQ ileostomy, midline abdominal wound, parastomal hernia HPI:  Perforated diverticulum with ileostomy ROS  Review of Systems  Gastrointestinal:        RLQ ileostomy Parastomal hernia Midline abdominal wound  Psychiatric/Behavioral:         Daily alcohol use  All other systems reviewed and are negative.  Vital signs:  Temp 98.2 F (36.8 C)  Exam:  Physical Exam Vitals reviewed.  Constitutional:      Appearance: He is obese.  Abdominal:     Hernia: A hernia is present.     Comments: Midline abdominal wound  Skin:    Findings: Lesion and rash present.  Neurological:     Mental Status: He is alert and oriented to person, place, and time.  Psychiatric:        Mood and Affect: Mood normal.     Stoma type/location:  RLQ ileostomy Stomal assessment/size:  1 3/8" retracted Peristomal assessment:  denuded skin. Contact irritant dermatitis Treatment options for stomal/peristomal skin: stoma powder skin prep Output: soft brown stool Ostomy pouching: 1pc.flat with barrier ring Education provided:  none    Impression/dx  Contact dermatitis Ileostomy  Discussion  PCP has changed (was discharged from last practice).  He is pleased with new provider.  Ongoing issues with new supply company due to new insurance (medicaid) Plan  See back next week. Ongoing wound care    Visit time: 40 minutes.   Maple Hudson FNP-BC

## 2022-02-07 NOTE — Progress Notes (Signed)
Remote pacemaker transmission.   

## 2022-02-08 ENCOUNTER — Ambulatory Visit (HOSPITAL_COMMUNITY)
Admission: RE | Admit: 2022-02-08 | Discharge: 2022-02-08 | Disposition: A | Discharge status: Home / Self Care | Payer: Medicaid Other | Source: Ambulatory Visit

## 2022-02-15 ENCOUNTER — Encounter (HOSPITAL_COMMUNITY)
Admission: RE | Admit: 2022-02-15 | Discharge: 2022-02-15 | Disposition: A | Discharge status: Home / Self Care | Payer: Medicaid Other | Source: Ambulatory Visit | Attending: Internal Medicine | Admitting: Internal Medicine

## 2022-02-15 DIAGNOSIS — L24B3 Irritant contact dermatitis related to fecal or urinary stoma or fistula: Secondary | ICD-10-CM | POA: Diagnosis not present

## 2022-02-15 DIAGNOSIS — Z432 Encounter for attention to ileostomy: Secondary | ICD-10-CM | POA: Diagnosis not present

## 2022-02-15 DIAGNOSIS — L259 Unspecified contact dermatitis, unspecified cause: Secondary | ICD-10-CM | POA: Diagnosis not present

## 2022-02-15 DIAGNOSIS — K435 Parastomal hernia without obstruction or  gangrene: Secondary | ICD-10-CM | POA: Insufficient documentation

## 2022-02-15 NOTE — Progress Notes (Signed)
[facility]   Reason for visit:  RLQ ileostomy Midline abdominal wound Parastomal hernia HPI:  Diverticulitis with perforation and ileostomy ROS  Review of Systems Vital signs:  BP (!) 145/112 (BP Location: Right Arm)   Pulse 94   Temp 98.3 F (36.8 C) (Tympanic)   Resp 18   SpO2 98%  Exam:  Physical Exam  Stoma type/location:   RLQ ileostomy, intact Had to change pouch and abdominal wound dressings last night does not need to change today. Blood pressure is elevated (see above) I recheck at conclusion of visit  It is 136/95.  He feels fine.  Just states he was out of breath from walking upon arrival.     Impression/dx  Contact dermatitis Nonhealing wound hernia Discussion  Patient has follow up appointment with surgeon.  He is hopeful for a plan for reversal.  He is attempting to stop drinking and improve diet for best chance to heal. He is motivated to stop.  Plan  See back in one week.     Visit time: 35 minutes.   Maple Hudson FNP-BC

## 2022-02-16 DIAGNOSIS — Z432 Encounter for attention to ileostomy: Secondary | ICD-10-CM | POA: Insufficient documentation

## 2022-02-16 DIAGNOSIS — L24B3 Irritant contact dermatitis related to fecal or urinary stoma or fistula: Secondary | ICD-10-CM | POA: Insufficient documentation

## 2022-02-16 NOTE — Discharge Instructions (Signed)
Follow-up next week.

## 2022-02-22 ENCOUNTER — Ambulatory Visit (HOSPITAL_COMMUNITY): Payer: Medicaid Other | Admitting: Nurse Practitioner

## 2022-02-28 ENCOUNTER — Ambulatory Visit (HOSPITAL_COMMUNITY): Payer: Medicaid Other | Admitting: Nurse Practitioner

## 2022-02-28 ENCOUNTER — Other Ambulatory Visit: Payer: Self-pay | Admitting: Student

## 2022-03-07 ENCOUNTER — Other Ambulatory Visit (HOSPITAL_COMMUNITY): Payer: Self-pay | Admitting: Nurse Practitioner

## 2022-03-07 DIAGNOSIS — L24B3 Irritant contact dermatitis related to fecal or urinary stoma or fistula: Secondary | ICD-10-CM

## 2022-03-07 DIAGNOSIS — K9413 Enterostomy malfunction: Secondary | ICD-10-CM

## 2022-03-07 DIAGNOSIS — T8189XD Other complications of procedures, not elsewhere classified, subsequent encounter: Secondary | ICD-10-CM

## 2022-03-08 ENCOUNTER — Ambulatory Visit (HOSPITAL_COMMUNITY)
Admission: RE | Admit: 2022-03-08 | Discharge: 2022-03-08 | Disposition: A | Discharge status: Home / Self Care | Payer: Medicaid Other | Source: Ambulatory Visit | Attending: Internal Medicine | Admitting: Internal Medicine

## 2022-03-08 DIAGNOSIS — X58XXXA Exposure to other specified factors, initial encounter: Secondary | ICD-10-CM | POA: Insufficient documentation

## 2022-03-08 DIAGNOSIS — L24B3 Irritant contact dermatitis related to fecal or urinary stoma or fistula: Secondary | ICD-10-CM | POA: Diagnosis not present

## 2022-03-08 DIAGNOSIS — Z432 Encounter for attention to ileostomy: Secondary | ICD-10-CM | POA: Diagnosis not present

## 2022-03-08 DIAGNOSIS — K9413 Enterostomy malfunction: Secondary | ICD-10-CM | POA: Diagnosis not present

## 2022-03-08 DIAGNOSIS — T8189XA Other complications of procedures, not elsewhere classified, initial encounter: Secondary | ICD-10-CM | POA: Insufficient documentation

## 2022-03-08 DIAGNOSIS — T148XXA Other injury of unspecified body region, initial encounter: Secondary | ICD-10-CM | POA: Diagnosis not present

## 2022-03-08 DIAGNOSIS — K435 Parastomal hernia without obstruction or  gangrene: Secondary | ICD-10-CM | POA: Diagnosis not present

## 2022-03-08 DIAGNOSIS — L259 Unspecified contact dermatitis, unspecified cause: Secondary | ICD-10-CM | POA: Insufficient documentation

## 2022-03-08 NOTE — Progress Notes (Signed)
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       Peristomal breakdown and periwound dermatitis  All other systems reviewed and are negative.  Vital signs:  BP (!) 152/84   Pulse 92   Temp 98.7 F (37.1 C)   Resp 17   SpO2 98%  Exam:  Physical Exam Vitals reviewed.  Constitutional:      Appearance: He is obese.  Abdominal:     Hernia: A hernia is present.     Comments: Midline abdominal wound  Skin:    Findings: Erythema, lesion and rash present.     Comments: Periwound and peristomal breakdown  Neurological:     Mental Status: He is alert and oriented to person, place, and time.  Psychiatric:        Mood and Affect: Mood normal.        Behavior: Behavior normal.     Stoma type/location:  RLQ ileostomy Stomal assessment/size:  1 3/8" flush, located near creasing.  Peristomal assessment:  redness and denuded skin Treatment options for stomal/peristomal skin: stoma powder and skin prep.   Output: liquid green stool Ostomy pouching: 1pc. Flat pouch with barrier ring and skin liquid adhesive Education provided:  Has upcoming appointment with surgeon to discuss reversal. Is trying to cut back on drinking alcohol and smoking.     Impression/dx  Contact dermatitis RLQ ileostomy Midline abdominal surgical wound, nonhealing Discussion  COntinue silver hydrofiber to open wounds of abdomen.  Cover with gauze  abd ABD pad and tape.  Change twice weekly.  Plan  See back after surgery appointment.     Visit time: 50 minutes.   Domenic Moras FNP-BC

## 2022-03-15 ENCOUNTER — Ambulatory Visit (HOSPITAL_COMMUNITY)
Admission: RE | Admit: 2022-03-15 | Discharge: 2022-03-15 | Disposition: A | Discharge status: Home / Self Care | Payer: Medicaid Other | Source: Ambulatory Visit | Attending: Nurse Practitioner | Admitting: Nurse Practitioner

## 2022-03-15 DIAGNOSIS — T8189XD Other complications of procedures, not elsewhere classified, subsequent encounter: Secondary | ICD-10-CM | POA: Insufficient documentation

## 2022-03-15 DIAGNOSIS — K9413 Enterostomy malfunction: Secondary | ICD-10-CM

## 2022-03-15 DIAGNOSIS — L24B3 Irritant contact dermatitis related to fecal or urinary stoma or fistula: Secondary | ICD-10-CM

## 2022-03-15 DIAGNOSIS — Y828 Other medical devices associated with adverse incidents: Secondary | ICD-10-CM | POA: Diagnosis not present

## 2022-03-15 DIAGNOSIS — K432 Incisional hernia without obstruction or gangrene: Secondary | ICD-10-CM | POA: Diagnosis not present

## 2022-03-15 DIAGNOSIS — Z432 Encounter for attention to ileostomy: Secondary | ICD-10-CM | POA: Insufficient documentation

## 2022-03-15 NOTE — Progress Notes (Signed)
[facility]   Reason for visit:  RLQ ileostomy with nonhealing midline abdominal wound  HPI:  Perforated diverticulum with RLQ ileostomy abdominal hernia and nonhealing surgical wound ROS  Review of Systems  Gastrointestinal:        Abdominal hernia RLQ ileostomy Midline incision, nonhealing  Skin:  Positive for color change, rash and wound.  Psychiatric/Behavioral:  Positive for agitation. The patient is nervous/anxious.        Frustrated due to insurance frustrations, Scientist, research (physical sciences) due to insurance and difficulty getting supplies.   All other systems reviewed and are negative.  Vital signs:  BP (!) 143/97 (BP Location: Right Arm)   Pulse 95   Temp 98.2 F (36.8 C) (Oral)   Resp 18   SpO2 96%  Exam:  Physical Exam Vitals reviewed.  Constitutional:      Appearance: He is obese.  Abdominal:     General: There is distension.     Hernia: A hernia is present.  Skin:    Findings: Lesion and rash present.  Neurological:     Mental Status: He is alert and oriented to person, place, and time.  Psychiatric:        Behavior: Behavior normal.     Stoma type/location:  RLQ ileostomy Stomal assessment/size:  1" f;ush Peristomal assessment:   irritant contact dermatitis Treatment options for stomal/peristomal skin: barrier ring 1 piece convex pouch Output: thin liquid stool Ostomy pouching: 1pc.flat with barrier ring Education provided:  Still working with [facility] (Medicaid carrier) [employer] and [employer] to secure supplies for ostomy and wound care.    Met with surgeon and they do not participate with his new insurance and he is trying to find a new Psychologist, sport and exercise.  Impression/dx  Irritant contact dermatitis Discussion  See above Plan  Follow up as needed.     Visit time: 35 minutes.   Domenic Moras FNP-BC

## 2022-03-17 DIAGNOSIS — K432 Incisional hernia without obstruction or gangrene: Secondary | ICD-10-CM | POA: Insufficient documentation

## 2022-03-17 NOTE — Discharge Instructions (Signed)
Will continue to work on Prior Auth for supplies/ [employer]/Medicaid

## 2022-03-22 ENCOUNTER — Ambulatory Visit (HOSPITAL_COMMUNITY)
Admission: RE | Admit: 2022-03-22 | Discharge: 2022-03-22 | Disposition: A | Discharge status: Home / Self Care | Payer: Medicaid Other | Source: Ambulatory Visit | Attending: Internal Medicine | Admitting: Internal Medicine

## 2022-03-22 DIAGNOSIS — K4020 Bilateral inguinal hernia, without obstruction or gangrene, not specified as recurrent: Secondary | ICD-10-CM | POA: Insufficient documentation

## 2022-03-22 DIAGNOSIS — K4000 Bilateral inguinal hernia, with obstruction, without gangrene, not specified as recurrent: Secondary | ICD-10-CM | POA: Diagnosis not present

## 2022-03-22 DIAGNOSIS — Z432 Encounter for attention to ileostomy: Secondary | ICD-10-CM | POA: Diagnosis not present

## 2022-03-22 DIAGNOSIS — L24B3 Irritant contact dermatitis related to fecal or urinary stoma or fistula: Secondary | ICD-10-CM | POA: Diagnosis not present

## 2022-03-22 DIAGNOSIS — L259 Unspecified contact dermatitis, unspecified cause: Secondary | ICD-10-CM | POA: Insufficient documentation

## 2022-03-22 DIAGNOSIS — T8189XA Other complications of procedures, not elsewhere classified, initial encounter: Secondary | ICD-10-CM | POA: Insufficient documentation

## 2022-03-22 NOTE — Progress Notes (Signed)
[facility]   Reason for visit:  RLQ ileostomy  nonhealing midline wound HPI:  Perforated diverticulum abdominal hernia and nonhealing midline abdominal wound ROS  Review of Systems  Gastrointestinal:        ABdominal hernia Nonhealing wound RLQ ileostomy  Psychiatric/Behavioral:         COncerned about finding new surgeon and new insurance  All other systems reviewed and are negative.  Vital signs:  BP (!) 150/96 (BP Location: Right Arm)   Pulse 95   Temp 97.6 F (36.4 C) (Oral)   Resp 18   SpO2 95%  Exam:  Physical Exam Vitals and nursing note reviewed.  Constitutional:      Appearance: He is obese.  Abdominal:     Hernia: A hernia is present.     Comments: Rounded abdomen Nonhealing surgical wound RLQ ileostomy  Skin:    General: Skin is dry.     Findings: Lesion and rash present.  Neurological:     Mental Status: He is alert and oriented to person, place, and time.  Psychiatric:        Behavior: Behavior normal.     Stoma type/location:  RLQ ileostomy Stomal assessment/size:  1" recessed Peristomal assessment:  intact  midline nonhealing wound  abdominal hernia has displaced anatomy and umbilicus is in pouching area in RLQ  Treatment options for stomal/peristomal skin: Stoma powder 1piece pouch barrier ring and skin tac adhesive Output: soft brown stool Ostomy pouching: 1pc. Flat with barrier ring Education provided:  I have enrolled patient with [employer] (again) and gotten prior authorization for wound and ostomy supplies through 03/2023  Auth # [id number] .  Rx for wound care supplies faxed numerous times to [phone] and phone # [phone].  [employer] and [employer] in disagreement whether auth needed or not, but Joycelyn Schmid [facility]) kindly assisted me after no less than 3 hours of time on the phone for approval.  Continues to need a new surgeon due to insurance changes.  Wound care performed and is unchanged.     Impression/dx  Contact  dermatitis Discussion  See back next week. Plan  Continue as ordered  return in one week    Visit time: 55 minutes.   Domenic Moras FNP-BC

## 2022-03-22 NOTE — Discharge Instructions (Signed)
Supplies given.

## 2022-03-28 DIAGNOSIS — Z932 Ileostomy status: Secondary | ICD-10-CM | POA: Diagnosis not present

## 2022-03-28 DIAGNOSIS — S31109A Unspecified open wound of abdominal wall, unspecified quadrant without penetration into peritoneal cavity, initial encounter: Secondary | ICD-10-CM | POA: Diagnosis not present

## 2022-03-29 ENCOUNTER — Ambulatory Visit (HOSPITAL_COMMUNITY)
Admission: RE | Admit: 2022-03-29 | Discharge: 2022-03-29 | Disposition: A | Discharge status: Home / Self Care | Payer: Medicaid Other | Source: Ambulatory Visit | Attending: Nurse Practitioner | Admitting: Nurse Practitioner

## 2022-03-29 DIAGNOSIS — L259 Unspecified contact dermatitis, unspecified cause: Secondary | ICD-10-CM | POA: Diagnosis not present

## 2022-03-29 DIAGNOSIS — E669 Obesity, unspecified: Secondary | ICD-10-CM | POA: Diagnosis not present

## 2022-03-29 DIAGNOSIS — T8189XA Other complications of procedures, not elsewhere classified, initial encounter: Secondary | ICD-10-CM | POA: Diagnosis not present

## 2022-03-29 DIAGNOSIS — K9419 Other complications of enterostomy: Secondary | ICD-10-CM | POA: Diagnosis not present

## 2022-03-29 DIAGNOSIS — T148XXA Other injury of unspecified body region, initial encounter: Secondary | ICD-10-CM | POA: Diagnosis not present

## 2022-03-29 DIAGNOSIS — K469 Unspecified abdominal hernia without obstruction or gangrene: Secondary | ICD-10-CM | POA: Insufficient documentation

## 2022-03-29 DIAGNOSIS — L24B3 Irritant contact dermatitis related to fecal or urinary stoma or fistula: Secondary | ICD-10-CM | POA: Diagnosis not present

## 2022-03-29 DIAGNOSIS — Z432 Encounter for attention to ileostomy: Secondary | ICD-10-CM | POA: Diagnosis not present

## 2022-03-29 NOTE — Progress Notes (Signed)
[facility]   Reason for visit:  RLQ ileostomy Intra abdominal hernia Nonhealing surgical wound HPI:  Perforated diverticulum ROS  Review of Systems  Gastrointestinal:  Positive for abdominal distention.       RLQ ileostomy Hernia Nonhealing wound to mid line abdomen  Skin:  Positive for rash and wound.  Psychiatric/Behavioral:  Positive for agitation.   All other systems reviewed and are negative.  Vital signs:  BP (!) 167/89 (BP Location: Right Arm)   Pulse 97   Temp 97.9 F (36.6 C) (Oral)   Resp 20   SpO2 96%  Exam:  Physical Exam Vitals and nursing note reviewed.  Constitutional:      Appearance: He is obese.  Abdominal:     General: There is distension.     Hernia: A hernia is present.     Comments: RLQ ileostomy, recessed  Skin:    Findings: Lesion and rash present.  Neurological:     Mental Status: He is alert and oriented to person, place, and time.  Psychiatric:        Mood and Affect: Mood normal.        Behavior: Behavior normal.     Stoma type/location:  RLQ recessed stoma Stomal assessment/size:  1" recessed pink Peristomal assessment:   Chronic denuded Treatment options for stomal/peristomal skin: Barrier ring, 1piece flat and patient chooses to use Skin Tac liquid adhesive despite dermatitis to peristomal skin Output: liquid brown stool Ostomy pouching: 1pc. flat Education provided:  Pouch change performed.  I am workign with a third company to help patient obtain supplies [place] [phone]  refaxed prescription and demographic information    Impression/dx  Contact dermatitis Nonhealing wound Hernia  Discussion  Patient is able to see his surgeon as his payor source provider has been changed to be in network Plan  See back as needed to monitor skin, ostomy and wound healing    Visit time: 50 minutes.   Domenic Moras FNP-BC

## 2022-04-04 ENCOUNTER — Ambulatory Visit (INDEPENDENT_AMBULATORY_CARE_PROVIDER_SITE_OTHER): Payer: Medicaid Other

## 2022-04-04 DIAGNOSIS — I442 Atrioventricular block, complete: Secondary | ICD-10-CM

## 2022-04-05 ENCOUNTER — Ambulatory Visit (HOSPITAL_COMMUNITY)
Admission: RE | Admit: 2022-04-05 | Discharge: 2022-04-05 | Disposition: A | Discharge status: Home / Self Care | Payer: Medicaid Other | Source: Ambulatory Visit | Attending: Nurse Practitioner | Admitting: Nurse Practitioner

## 2022-04-05 DIAGNOSIS — L24B3 Irritant contact dermatitis related to fecal or urinary stoma or fistula: Secondary | ICD-10-CM | POA: Insufficient documentation

## 2022-04-05 DIAGNOSIS — K469 Unspecified abdominal hernia without obstruction or gangrene: Secondary | ICD-10-CM | POA: Insufficient documentation

## 2022-04-05 DIAGNOSIS — Y828 Other medical devices associated with adverse incidents: Secondary | ICD-10-CM | POA: Diagnosis not present

## 2022-04-05 DIAGNOSIS — Z432 Encounter for attention to ileostomy: Secondary | ICD-10-CM | POA: Diagnosis not present

## 2022-04-05 DIAGNOSIS — T148XXA Other injury of unspecified body region, initial encounter: Secondary | ICD-10-CM | POA: Diagnosis not present

## 2022-04-05 DIAGNOSIS — T8189XA Other complications of procedures, not elsewhere classified, initial encounter: Secondary | ICD-10-CM | POA: Insufficient documentation

## 2022-04-05 LAB — CUP PACEART REMOTE DEVICE CHECK
Battery Remaining Longevity: 96 mo
Battery Voltage: 3.02 V
Brady Statistic AS VP Percent: 97.54 %
Brady Statistic AS VS Percent: 0.03 %
Brady Statistic RV Percent Paced: 99.77 %
Date Time Interrogation Session: 20231023155956
Implantable Pulse Generator Implant Date: 20220721
Lead Channel Impedance Value: 560 Ohm
Lead Channel Pacing Threshold Amplitude: 0.25 V
Lead Channel Pacing Threshold Pulse Width: 0.24 ms
Lead Channel Sensing Intrinsic Amplitude: 23.625 mV
Lead Channel Setting Pacing Amplitude: 0.875
Lead Channel Setting Pacing Pulse Width: 0.24 ms
Lead Channel Setting Sensing Sensitivity: 2 mV

## 2022-04-05 NOTE — Progress Notes (Signed)
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  Psychiatric/Behavioral:         States he is depressed over his long term illness and health.  He is having difficulty getting access to tramadol.  His PCP has advised him to seek care with a pain clinic and he is resistant to doing that.   All other systems reviewed and are negative.  Vital signs:  BP (!) 157/88   Pulse 94   Temp 98.4 F (36.9 C)   Resp 19   SpO2 97%  Exam:  Physical Exam Vitals and nursing note reviewed.  Constitutional:      Appearance: He is obese.  Abdominal:     Comments: Rotund abdomen from hernia RLQ ileostomy, recessed' Nonhealing surgical wound  Neurological:     Mental Status: He is alert.  Psychiatric:     Comments: Agitation over insurance stress, access to supplies     Stoma type/location:  RLQ ileostomy Stomal assessment/size:  1 " recessed Peristomal assessment:  contact dermatitis, chronic   Patient uses skin TAC to promote adhesion Treatment options for stomal/peristomal skin: barrier ring and 1 piece pouch.  Wraps abdomen in ace wrap for hernia support  Output: soft brown stool Ostomy pouching: 1pc. pouch Education provided:  Patient has upcoming consult with surgery team Continue to address supply challenges since changing insurance.     Impression/dx  Contact dermatitis Ileostomy Nonhealing surgical wound Discussion  See back as needed Plan  Follow up with surgical team for next steps.  Continue silver hydrofiber to nonhealing abdominal wound due to foul odor, purulence,  chronic.  No fever, warmth or induration.      Visit time: 50 minutes.   Domenic Moras FNP-BC

## 2022-04-05 NOTE — Discharge Instructions (Signed)
CAll Tazomisha with [facility] regarding wound supplies [phone] EXT [id number]  I faxed supply order to [place] 04/04/22 [phone]

## 2022-04-12 ENCOUNTER — Ambulatory Visit (HOSPITAL_COMMUNITY)
Admission: RE | Admit: 2022-04-12 | Discharge: 2022-04-12 | Disposition: A | Discharge status: Home / Self Care | Payer: Medicaid Other | Source: Ambulatory Visit | Attending: Nurse Practitioner | Admitting: Nurse Practitioner

## 2022-04-12 DIAGNOSIS — K432 Incisional hernia without obstruction or gangrene: Secondary | ICD-10-CM | POA: Diagnosis not present

## 2022-04-12 DIAGNOSIS — L24B3 Irritant contact dermatitis related to fecal or urinary stoma or fistula: Secondary | ICD-10-CM | POA: Diagnosis not present

## 2022-04-12 DIAGNOSIS — Y832 Surgical operation with anastomosis, bypass or graft as the cause of abnormal reaction of the patient, or of later complication, without mention of misadventure at the time of the procedure: Secondary | ICD-10-CM | POA: Insufficient documentation

## 2022-04-12 DIAGNOSIS — L259 Unspecified contact dermatitis, unspecified cause: Secondary | ICD-10-CM | POA: Diagnosis present

## 2022-04-12 DIAGNOSIS — T8189XA Other complications of procedures, not elsewhere classified, initial encounter: Secondary | ICD-10-CM | POA: Insufficient documentation

## 2022-04-12 DIAGNOSIS — K469 Unspecified abdominal hernia without obstruction or gangrene: Secondary | ICD-10-CM | POA: Insufficient documentation

## 2022-04-12 DIAGNOSIS — K9413 Enterostomy malfunction: Secondary | ICD-10-CM | POA: Insufficient documentation

## 2022-04-12 DIAGNOSIS — Z932 Ileostomy status: Secondary | ICD-10-CM | POA: Diagnosis present

## 2022-04-12 NOTE — Progress Notes (Signed)
[facility]   Reason for visit:  RLQ ileostomy  midline abdominal nonhealing wound  abdominal hernia HPI:  Perforated diverticulum ROS  Review of Systems  Gastrointestinal:        RLQ ileostomy Abdominal hernia Nonhealing surgical wound Contact dermatitis to peristoma and periwound skin   Skin:  Positive for color change, rash and wound.  All other systems reviewed and are negative.  Vital signs:  BP (!) 136/91 (BP Location: Right Arm)   Pulse 96   Temp 97.7 F (36.5 C) (Oral)   Resp 20   SpO2 96%  Exam:  Physical Exam Vitals reviewed.  Constitutional:      Appearance: He is obese.  Abdominal:     Hernia: A hernia is present.     Comments: RLQ ileostomy  Skin:    Findings: Lesion and rash present.  Neurological:     Mental Status: He is alert and oriented to person, place, and time.  Psychiatric:        Mood and Affect: Mood normal.        Behavior: Behavior normal.     Stoma type/location:  RLQ ileostomy  Stomal assessment/size:  1" recessed Peristomal assessment:  creasing at 3 o'clock.  Filled in with paste Treatment options for stomal/peristomal skin: barrier ring, Skin tac Output: soft tan stool Ostomy pouching: 1pc. Education provided:  Frustration  Latest supplier has now informed him he is not in their network.Neither [employer] or supply companies can agree who can provide his wound and ostomy supplies.     Impression/dx  Contact dermatitis Hernia Nonhealing surgical wound Discussion  COntinue to pursue stable supply acquisition Plan  See back next week Has surgery appointment in December    Visit time: 55 minutes.   Domenic Moras FNP-BC

## 2022-04-13 DIAGNOSIS — Z419 Encounter for procedure for purposes other than remedying health state, unspecified: Secondary | ICD-10-CM | POA: Diagnosis not present

## 2022-04-18 DIAGNOSIS — Z7689 Persons encountering health services in other specified circumstances: Secondary | ICD-10-CM | POA: Diagnosis not present

## 2022-04-19 ENCOUNTER — Ambulatory Visit (HOSPITAL_COMMUNITY): Payer: Medicaid Other | Admitting: Nurse Practitioner

## 2022-04-22 ENCOUNTER — Ambulatory Visit (HOSPITAL_COMMUNITY)
Admission: RE | Admit: 2022-04-22 | Discharge: 2022-04-22 | Disposition: A | Discharge status: Home / Self Care | Payer: Medicaid Other | Source: Ambulatory Visit | Attending: Internal Medicine | Admitting: Internal Medicine

## 2022-04-22 DIAGNOSIS — T8189XD Other complications of procedures, not elsewhere classified, subsequent encounter: Secondary | ICD-10-CM

## 2022-04-22 DIAGNOSIS — Z932 Ileostomy status: Secondary | ICD-10-CM | POA: Diagnosis not present

## 2022-04-22 DIAGNOSIS — T8189XA Other complications of procedures, not elsewhere classified, initial encounter: Secondary | ICD-10-CM | POA: Diagnosis not present

## 2022-04-22 DIAGNOSIS — Y828 Other medical devices associated with adverse incidents: Secondary | ICD-10-CM | POA: Diagnosis not present

## 2022-04-22 DIAGNOSIS — K9413 Enterostomy malfunction: Secondary | ICD-10-CM | POA: Diagnosis not present

## 2022-04-22 DIAGNOSIS — L24B3 Irritant contact dermatitis related to fecal or urinary stoma or fistula: Secondary | ICD-10-CM

## 2022-04-22 DIAGNOSIS — Z7689 Persons encountering health services in other specified circumstances: Secondary | ICD-10-CM | POA: Diagnosis not present

## 2022-04-22 NOTE — Progress Notes (Signed)
[facility]   Reason for visit:  RLQ ileostomy with midline abdominal wound and hernia HPI:  Perforated diverticulum, ileostomy, nonhealing surgical wound with abdominal hernia ROS  Review of Systems  Gastrointestinal:        RLQ ileostomy Nonhealing surgical wound  Skin:  Positive for color change, rash and wound.  All other systems reviewed and are negative.  Vital signs:  BP (!) 145/100 (BP Location: Right Arm)   Pulse 94   Temp 98.1 F (36.7 C) (Oral)   Resp 18   SpO2 96%  Exam:  Physical Exam Vitals reviewed.  Constitutional:      Appearance: He is obese.  Abdominal:     Hernia: A hernia is present.     Comments: Nonhealing wound  Skin:    General: Skin is dry.     Findings: Lesion and rash present.  Neurological:     Mental Status: He is alert and oriented to person, place, and time.  Psychiatric:        Mood and Affect: Mood normal.        Thought Content: Thought content normal.     Stoma type/location:  RLQ recessed ileostomy Stomal assessment/size:  1" pink and moist Peristomal assessment:  chronic contact dermatitis Treatment options for stomal/peristomal skin: skin prep, skin tak adhesive, barrier ring and 1piece flat pouch Output: soft brown stool Ostomy pouching: 1pc. Education provided:  Still ongoing issues with supplies. Ongoing coordination with supply company to get established with supplies long term    Impression/dx  Contact dermatitis Discussion  See back next week  awaiting appointment with surgeon  Plan  Follow up one week    Visit time: 45 minutes.   Maple Hudson FNP-BC

## 2022-04-26 ENCOUNTER — Ambulatory Visit (HOSPITAL_COMMUNITY)
Admission: RE | Admit: 2022-04-26 | Discharge: 2022-04-26 | Disposition: A | Discharge status: Home / Self Care | Payer: Medicaid Other | Source: Ambulatory Visit | Attending: Internal Medicine | Admitting: Internal Medicine

## 2022-04-26 DIAGNOSIS — K9413 Enterostomy malfunction: Secondary | ICD-10-CM | POA: Diagnosis not present

## 2022-04-26 DIAGNOSIS — K4020 Bilateral inguinal hernia, without obstruction or gangrene, not specified as recurrent: Secondary | ICD-10-CM

## 2022-04-26 DIAGNOSIS — T8189XA Other complications of procedures, not elsewhere classified, initial encounter: Secondary | ICD-10-CM | POA: Insufficient documentation

## 2022-04-26 DIAGNOSIS — L258 Unspecified contact dermatitis due to other agents: Secondary | ICD-10-CM | POA: Insufficient documentation

## 2022-04-26 DIAGNOSIS — L24B3 Irritant contact dermatitis related to fecal or urinary stoma or fistula: Secondary | ICD-10-CM | POA: Diagnosis not present

## 2022-04-26 DIAGNOSIS — Z432 Encounter for attention to ileostomy: Secondary | ICD-10-CM | POA: Insufficient documentation

## 2022-04-26 DIAGNOSIS — T8189XS Other complications of procedures, not elsewhere classified, sequela: Secondary | ICD-10-CM | POA: Insufficient documentation

## 2022-04-26 DIAGNOSIS — T8189XD Other complications of procedures, not elsewhere classified, subsequent encounter: Secondary | ICD-10-CM | POA: Diagnosis not present

## 2022-04-26 DIAGNOSIS — Z7689 Persons encountering health services in other specified circumstances: Secondary | ICD-10-CM | POA: Diagnosis not present

## 2022-04-26 DIAGNOSIS — K469 Unspecified abdominal hernia without obstruction or gangrene: Secondary | ICD-10-CM | POA: Insufficient documentation

## 2022-04-26 NOTE — Progress Notes (Signed)
[facility]   Reason for visit:  Ileostomy care Wound care HPI:  Perforated diverticulum with ileostomy and post surgical abdominal hernia with nonhealing surgical wound ROS  Review of Systems  Gastrointestinal:        RLQ ileostomy Abdominal hernia Nonhealing surgical wound  Skin:  Positive for color change.       Peristomal redness, chronic Medical adhesive related skin injury to periwound skin   All other systems reviewed and are negative.  Vital signs:  BP (!) 148/98   Pulse 89   Temp 98.1 F (36.7 C) (Oral)   Resp 18   SpO2 95%  Exam:  Physical Exam Vitals reviewed.  Constitutional:      Appearance: He is obese.  Abdominal:     Hernia: No hernia is present.     Comments: RLQ ileostomy Nonhealing surgical wound  Skin:    Findings: Lesion present. No rash.     Comments: Severe medical adhesive related skin injury to peristomal skin and periwound skin  Neurological:     Mental Status: He is alert and oriented to person, place, and time.  Psychiatric:        Mood and Affect: Mood normal.     Stoma type/location:  RLQ ileostomy Stomal assessment/size:  1 1/4" flush Peristomal assessment:  contact dermatitis Treatment options for stomal/peristomal skin: barrier ring to create flat pouching surface Output: liquid brown effluent Ostomy pouching: 1pc. With barrier ring Education provided:  Has appointment with surgery team to discuss options Abdominal dressing changed. Continues Aquacel Ag to open areas to absorb and provide topical antimicrobial coverage    Impression/dx  Contact dermatitis Nonhealing wound Abdominal hernia ileostomy Discussion  Increase protein intake, curb smoking and drinking for optimal results with surgery.  Plan  See back in one week.     Visit time: 50 minutes.   Maple Hudson FNP-BC

## 2022-04-26 NOTE — Discharge Instructions (Signed)
See back next week

## 2022-04-27 ENCOUNTER — Encounter (HOSPITAL_COMMUNITY): Payer: Self-pay | Admitting: Nurse Practitioner

## 2022-04-28 NOTE — Discharge Instructions (Signed)
COntinue aquacel Ag See back in one week.

## 2022-05-02 NOTE — Progress Notes (Signed)
Remote pacemaker transmission.   

## 2022-05-03 ENCOUNTER — Ambulatory Visit (HOSPITAL_COMMUNITY): Payer: Medicaid Other | Admitting: Nurse Practitioner

## 2022-05-10 ENCOUNTER — Ambulatory Visit (HOSPITAL_COMMUNITY): Payer: Medicaid Other | Admitting: Nurse Practitioner

## 2022-05-13 ENCOUNTER — Ambulatory Visit (HOSPITAL_COMMUNITY)
Admission: RE | Admit: 2022-05-13 | Discharge: 2022-05-13 | Disposition: A | Discharge status: Home / Self Care | Payer: Medicaid Other | Source: Ambulatory Visit | Attending: Nurse Practitioner | Admitting: Nurse Practitioner

## 2022-05-13 DIAGNOSIS — Y838 Other surgical procedures as the cause of abnormal reaction of the patient, or of later complication, without mention of misadventure at the time of the procedure: Secondary | ICD-10-CM | POA: Insufficient documentation

## 2022-05-13 DIAGNOSIS — L24B3 Irritant contact dermatitis related to fecal or urinary stoma or fistula: Secondary | ICD-10-CM | POA: Insufficient documentation

## 2022-05-13 DIAGNOSIS — T8189XD Other complications of procedures, not elsewhere classified, subsequent encounter: Secondary | ICD-10-CM

## 2022-05-13 DIAGNOSIS — K469 Unspecified abdominal hernia without obstruction or gangrene: Secondary | ICD-10-CM | POA: Diagnosis not present

## 2022-05-13 DIAGNOSIS — Z419 Encounter for procedure for purposes other than remedying health state, unspecified: Secondary | ICD-10-CM | POA: Diagnosis not present

## 2022-05-13 DIAGNOSIS — Z432 Encounter for attention to ileostomy: Secondary | ICD-10-CM

## 2022-05-13 DIAGNOSIS — Z7689 Persons encountering health services in other specified circumstances: Secondary | ICD-10-CM | POA: Diagnosis not present

## 2022-05-13 DIAGNOSIS — T8189XA Other complications of procedures, not elsewhere classified, initial encounter: Secondary | ICD-10-CM | POA: Diagnosis not present

## 2022-05-13 NOTE — Progress Notes (Signed)
[facility]   Reason for visit:  RLQ ileostomy Abdominal hernia Nonhealing surgical wound HPI:   Past Medical History:  Diagnosis Date   Diabetes mellitus without complication (HCC)    Diverticulitis    DVT (deep venous thrombosis) (HCC)    ETOH abuse    H/O colectomy    Hyperlipidemia    Hypertension    Smoker    Family History  Problem Relation Age of Onset   COPD Mother    Prostate cancer Father    Allergies  Allergen Reactions   Lisinopril     Other reaction(s): renal effects   Codeine Other (See Comments) and Hives    unknown Other reaction(s): Other (See Comments) unknown unknown   Current Outpatient Medications  Medication Sig Dispense Refill Last Dose   acetaminophen (TYLENOL) 650 MG CR tablet Take 650 mg by mouth every 8 (eight) hours as needed for pain.       amLODipine (NORVASC) 10 MG tablet Take 10 mg by mouth daily.      atorvastatin (LIPITOR) 10 MG tablet Take 10 mg by mouth daily.      folic acid (FOLVITE) 1 MG tablet TAKE 1 TABLET(1 MG) BY MOUTH DAILY 90 tablet 3    metFORMIN (GLUCOPHAGE) 1000 MG tablet Take 500 mg by mouth daily.       metoprolol succinate (TOPROL-XL) 50 MG 24 hr tablet TAKE 1 TABLET(50 MG) BY MOUTH DAILY 90 tablet 2    Multiple Vitamin (MULTIVITAMIN WITH MINERALS) TABS tablet Take 1 tablet by mouth every other day.       thiamine 100 MG tablet Take 1 tablet (100 mg total) by mouth daily. 30 tablet 6    traMADol (ULTRAM) 50 MG tablet Take 1 tablet (50 mg total) by mouth every 6 (six) hours as needed for moderate pain. 30 tablet 0    triamcinolone cream (KENALOG) 0.5 % as directed.      No current facility-administered medications for this encounter.   ROS  Review of Systems  Gastrointestinal:        RLQ ileostomy Abdominal hernia Nonhealing surgical wound  Skin:  Positive for wound.       Nonhealing surgical wound  All other systems reviewed and are negative.  Vital signs:  BP (!) 168/99 (BP Location: Right  Arm)   Pulse 96   Temp (!) 97.5 F (36.4 C) (Oral)   Resp 18   SpO2 95%  Exam:  Physical Exam Vitals reviewed.  Constitutional:      Appearance: Normal appearance.  Abdominal:     Hernia: A hernia is present.     Comments: Nonhealing wound  Skin:    General: Skin is warm and dry.     Comments: Nonhealing surgical wound   Neurological:     Mental Status: He is alert and oriented to person, place, and time.  Psychiatric:     Comments: Anxiety and frustration over difficulty seeing surgeon. Discussed need to stop drinking and smoking to minimize risk of  potential complications that can occur      Stoma type/location:  RLQ ileostomy, flush Stomal assessment/size:  1 3/8" pale pink Peristomal assessment:  contact dermatitis Treatment options for stomal/peristomal skin: barrier ring, skin TAC adhesive Output: soft brown stool Ostomy pouching: 1pc. Flat pouch with barrier ring Education provided:  continue same pouch     Impression/dx  Contact dermatitis Hernia Nonhealing wound Discussion  See back in one week Plan  Will send notes and measurements to  supply company to obtain needed supplies.   Wound type: midline abdominal Measurement:midline surgical site is nonintact   28 cm x 34 cm x 0.3 cm  Wound bed: Beefy red Drainage (amount, consistency, odor) moderate purulence with musty odor Periwound: contact dermatitis to periwound skin from tape.  RLQ ileostomy Dressing procedure/placement/frequency: aquacel to open wounds. COver with gauze, ABD pads and tape.  Change twice weekly and PRN soilage.  Patient has been distressed about surgical appointment and difficulty getting seen.  His appointment was originally cancelled due to ongoing alcohol and nicotine use.  He is trying to stop.  He now has an appointment. He wants to discuss hernia repair and possible ostomy reversal.       Visit time: 60 minutes.   Maple Hudson FNP-BC

## 2022-05-16 NOTE — Discharge Instructions (Signed)
Continue wound care Ostomy care Continue to work towards being alcohol and nicotine free Keep surgical appointment

## 2022-05-17 ENCOUNTER — Ambulatory Visit (HOSPITAL_COMMUNITY)
Admission: RE | Admit: 2022-05-17 | Discharge: 2022-05-17 | Disposition: A | Discharge status: Home / Self Care | Payer: Medicaid Other | Source: Ambulatory Visit | Attending: Nurse Practitioner | Admitting: Nurse Practitioner

## 2022-05-17 ENCOUNTER — Ambulatory Visit (HOSPITAL_COMMUNITY): Payer: Medicaid Other | Admitting: Nurse Practitioner

## 2022-05-17 DIAGNOSIS — Z432 Encounter for attention to ileostomy: Secondary | ICD-10-CM | POA: Diagnosis not present

## 2022-05-17 DIAGNOSIS — K435 Parastomal hernia without obstruction or  gangrene: Secondary | ICD-10-CM | POA: Diagnosis not present

## 2022-05-17 DIAGNOSIS — K9413 Enterostomy malfunction: Secondary | ICD-10-CM

## 2022-05-17 DIAGNOSIS — Z7984 Long term (current) use of oral hypoglycemic drugs: Secondary | ICD-10-CM | POA: Diagnosis not present

## 2022-05-17 DIAGNOSIS — Z9049 Acquired absence of other specified parts of digestive tract: Secondary | ICD-10-CM | POA: Diagnosis not present

## 2022-05-17 DIAGNOSIS — E119 Type 2 diabetes mellitus without complications: Secondary | ICD-10-CM | POA: Diagnosis not present

## 2022-05-17 DIAGNOSIS — Z86718 Personal history of other venous thrombosis and embolism: Secondary | ICD-10-CM | POA: Insufficient documentation

## 2022-05-17 DIAGNOSIS — Z7689 Persons encountering health services in other specified circumstances: Secondary | ICD-10-CM | POA: Diagnosis not present

## 2022-05-17 DIAGNOSIS — E785 Hyperlipidemia, unspecified: Secondary | ICD-10-CM | POA: Diagnosis not present

## 2022-05-17 DIAGNOSIS — L24B3 Irritant contact dermatitis related to fecal or urinary stoma or fistula: Secondary | ICD-10-CM | POA: Diagnosis not present

## 2022-05-17 DIAGNOSIS — I10 Essential (primary) hypertension: Secondary | ICD-10-CM | POA: Diagnosis not present

## 2022-05-17 DIAGNOSIS — Z87891 Personal history of nicotine dependence: Secondary | ICD-10-CM | POA: Diagnosis not present

## 2022-05-17 DIAGNOSIS — Z79899 Other long term (current) drug therapy: Secondary | ICD-10-CM | POA: Diagnosis not present

## 2022-05-17 NOTE — Progress Notes (Addendum)
[facility]   Reason for visit:  Nonhealing surgical wound Parastomal hernia RLQ ileostomy HPI:   Past Medical History:  Diagnosis Date  . Diabetes mellitus without complication (HCC)   . Diverticulitis   . DVT (deep venous thrombosis) (HCC)   . ETOH abuse   . H/O colectomy   . Hyperlipidemia   . Hypertension   . Smoker    Family History  Problem Relation Age of Onset  . COPD Mother   . Prostate cancer Father    Allergies  Allergen Reactions  . Lisinopril     Other reaction(s): renal effects  . Codeine Other (See Comments) and Hives    unknown Other reaction(s): Other (See Comments) unknown unknown   Current Outpatient Medications  Medication Sig Dispense Refill Last Dose  . acetaminophen (TYLENOL) 650 MG CR tablet Take 650 mg by mouth every 8 (eight) hours as needed for pain.      Marland Kitchen amLODipine (NORVASC) 10 MG tablet Take 10 mg by mouth daily.     Marland Kitchen atorvastatin (LIPITOR) 10 MG tablet Take 10 mg by mouth daily.     . folic acid (FOLVITE) 1 MG tablet TAKE 1 TABLET(1 MG) BY MOUTH DAILY 90 tablet 3   . metFORMIN (GLUCOPHAGE) 1000 MG tablet Take 500 mg by mouth daily.      . metoprolol succinate (TOPROL-XL) 50 MG 24 hr tablet TAKE 1 TABLET(50 MG) BY MOUTH DAILY 90 tablet 2   . Multiple Vitamin (MULTIVITAMIN WITH MINERALS) TABS tablet Take 1 tablet by mouth every other day.      . thiamine 100 MG tablet Take 1 tablet (100 mg total) by mouth daily. 30 tablet 6   . traMADol (ULTRAM) 50 MG tablet Take 1 tablet (50 mg total) by mouth every 6 (six) hours as needed for moderate pain. 30 tablet 0   . triamcinolone cream (KENALOG) 0.5 % as directed.      No current facility-administered medications for this encounter.   ROS  Review of Systems  HENT:         Upcoming dental extractions, possible root canal.   Gastrointestinal:        RLQ ileostomy Abdominal hernia Nonhealing surgical wound  Musculoskeletal:  Positive for gait problem.       Uses wheelchair   Skin:  Positive for rash and wound.  All other systems reviewed and are negative. Vital signs:  BP (!) 171/90   Pulse 96   Temp 98.7 F (37.1 C)   Resp 17   SpO2 95%  Exam:  Physical Exam Vitals reviewed.  Constitutional:      Appearance: He is obese.  Abdominal:     Hernia: A hernia is present.     Comments: RLq ileostomy  Neurological:     Mental Status: He is alert and oriented to person, place, and time.  Psychiatric:     Comments: Anxious about reversal.     Stoma type/location:  1" recessed pink and moist Stomal assessment/size:  RLQ ileostomy Peristomal assessment:  chronic contact dermatitis, uses Skin TAC to promote seal.  Treatment options for stomal/peristomal skin: barrier ring and barrier strip flat 1 piece pouch Output: liquid brown stool Ostomy pouching: 1pc. Flat pouch with barrier ring Education provided:  Wound care, supplies are still an ongoing issue, provided topical wound care products    Impression/dx  Contact dermatitis Ileostomy Abdominal hernia Nonhealing surgical wound Discussion  See back as needed.  Plan  Awaiting surgery appointment for discussion  around reversal, hernia repair.     Visit time: 45 minutes.   Maple Hudson FNP-BC

## 2022-05-17 NOTE — Discharge Instructions (Signed)
Keep appointment with surgery team

## 2022-05-24 ENCOUNTER — Ambulatory Visit (HOSPITAL_COMMUNITY)
Admission: RE | Admit: 2022-05-24 | Discharge: 2022-05-24 | Disposition: A | Discharge status: Home / Self Care | Payer: Medicaid Other | Source: Ambulatory Visit | Attending: Nurse Practitioner | Admitting: Nurse Practitioner

## 2022-05-24 ENCOUNTER — Other Ambulatory Visit (HOSPITAL_COMMUNITY): Payer: Self-pay | Admitting: Nurse Practitioner

## 2022-05-24 DIAGNOSIS — Z432 Encounter for attention to ileostomy: Secondary | ICD-10-CM | POA: Insufficient documentation

## 2022-05-24 DIAGNOSIS — I10 Essential (primary) hypertension: Secondary | ICD-10-CM | POA: Insufficient documentation

## 2022-05-24 DIAGNOSIS — E669 Obesity, unspecified: Secondary | ICD-10-CM | POA: Diagnosis not present

## 2022-05-24 DIAGNOSIS — K469 Unspecified abdominal hernia without obstruction or gangrene: Secondary | ICD-10-CM | POA: Insufficient documentation

## 2022-05-24 DIAGNOSIS — Z86718 Personal history of other venous thrombosis and embolism: Secondary | ICD-10-CM | POA: Diagnosis not present

## 2022-05-24 DIAGNOSIS — Z87891 Personal history of nicotine dependence: Secondary | ICD-10-CM | POA: Insufficient documentation

## 2022-05-24 DIAGNOSIS — L24B3 Irritant contact dermatitis related to fecal or urinary stoma or fistula: Secondary | ICD-10-CM | POA: Diagnosis not present

## 2022-05-24 DIAGNOSIS — Z7984 Long term (current) use of oral hypoglycemic drugs: Secondary | ICD-10-CM | POA: Diagnosis not present

## 2022-05-24 DIAGNOSIS — Z79899 Other long term (current) drug therapy: Secondary | ICD-10-CM | POA: Diagnosis not present

## 2022-05-24 DIAGNOSIS — Z7689 Persons encountering health services in other specified circumstances: Secondary | ICD-10-CM | POA: Diagnosis not present

## 2022-05-24 DIAGNOSIS — E119 Type 2 diabetes mellitus without complications: Secondary | ICD-10-CM | POA: Diagnosis not present

## 2022-05-24 DIAGNOSIS — Z9049 Acquired absence of other specified parts of digestive tract: Secondary | ICD-10-CM | POA: Diagnosis not present

## 2022-05-24 DIAGNOSIS — E785 Hyperlipidemia, unspecified: Secondary | ICD-10-CM | POA: Insufficient documentation

## 2022-05-24 NOTE — Progress Notes (Signed)
[facility]   Reason for visit:  RLQ ileostomy Nonhealing surgical wound Abdominal hernia HPI:   Past Medical History:  Diagnosis Date   Diabetes mellitus without complication (HCC)    Diverticulitis    DVT (deep venous thrombosis) (HCC)    ETOH abuse    H/O colectomy    Hyperlipidemia    Hypertension    Smoker    Family History  Problem Relation Age of Onset   COPD Mother    Prostate cancer Father    Allergies  Allergen Reactions   Lisinopril     Other reaction(s): renal effects   Codeine Other (See Comments) and Hives    unknown Other reaction(s): Other (See Comments) unknown unknown   Current Outpatient Medications  Medication Sig Dispense Refill Last Dose   acetaminophen (TYLENOL) 650 MG CR tablet Take 650 mg by mouth every 8 (eight) hours as needed for pain.       amLODipine (NORVASC) 10 MG tablet Take 10 mg by mouth daily.      atorvastatin (LIPITOR) 10 MG tablet Take 10 mg by mouth daily.      folic acid (FOLVITE) 1 MG tablet TAKE 1 TABLET(1 MG) BY MOUTH DAILY 90 tablet 3    metFORMIN (GLUCOPHAGE) 1000 MG tablet Take 500 mg by mouth daily.       metoprolol succinate (TOPROL-XL) 50 MG 24 hr tablet TAKE 1 TABLET(50 MG) BY MOUTH DAILY 90 tablet 2    Multiple Vitamin (MULTIVITAMIN WITH MINERALS) TABS tablet Take 1 tablet by mouth every other day.       thiamine 100 MG tablet Take 1 tablet (100 mg total) by mouth daily. 30 tablet 6    traMADol (ULTRAM) 50 MG tablet Take 1 tablet (50 mg total) by mouth every 6 (six) hours as needed for moderate pain. 30 tablet 0    triamcinolone cream (KENALOG) 0.5 % as directed.      No current facility-administered medications for this encounter.   ROS  Review of Systems  Gastrointestinal:        Abdominal hernia RLQ ilestomy  Skin:  Positive for wound.       Nonhealing  midlne abdominal wounds.    All other systems reviewed and are negative.  Vital signs:  BP (!) 150/96 (BP Location: Right Arm)   Pulse 93    Temp 97.8 F (36.6 C) (Oral)   Resp 18   SpO2 95%  Exam:  Physical Exam Vitals reviewed.  Constitutional:      Appearance: He is obese.  Abdominal:     Hernia: A hernia is present.     Comments: Recessed ileostomy  Skin:    Findings: Rash present.     Comments: Dermatitis around stoma and wound.    Neurological:     Mental Status: He is alert and oriented to person, place, and time.  Psychiatric:        Mood and Affect: Mood normal.        Behavior: Behavior normal.     Stoma type/location:  RLQ ileostomy, recessed Stomal assessment/size:  1"  Peristomal assessment:  denuded skin  Uses skin TAC adhesive, states this is all that works for him.  Treatment options for stomal/peristomal skin: Barrier ring, skin TAC  barrier ring Output: liquid brown stool Ostomy pouching: 1pc. With barrier ring, skin TAC Education provided:  continue same pouching for now.  Nonhealing abdominal wound, continue with aquacel ag to open wounds cover with gauze, ABD pads and  tape. Secure with ace wrap for hernia support.     Impression/dx  Contact dermatitis Discussion  See back as needed.  Plan  Upcoming appointment with surgery to discuss hernia repair, possibility of ostomy reversal.     Visit time: 45 minutes.   Maple Hudson FNP-BC

## 2022-05-25 ENCOUNTER — Encounter: Payer: Self-pay | Admitting: Cardiology

## 2022-05-25 ENCOUNTER — Ambulatory Visit: Payer: Medicaid Other | Attending: Cardiology | Admitting: Cardiology

## 2022-05-25 VITALS — BP 152/96 | HR 103 | Ht 73.0 in | Wt 239.0 lb

## 2022-05-25 DIAGNOSIS — Z950 Presence of cardiac pacemaker: Secondary | ICD-10-CM

## 2022-05-25 DIAGNOSIS — Z7689 Persons encountering health services in other specified circumstances: Secondary | ICD-10-CM | POA: Diagnosis not present

## 2022-05-25 DIAGNOSIS — I442 Atrioventricular block, complete: Secondary | ICD-10-CM

## 2022-05-25 LAB — CUP PACEART INCLINIC DEVICE CHECK
Date Time Interrogation Session: 20231213145937
Implantable Pulse Generator Implant Date: 20220721

## 2022-05-25 NOTE — Patient Instructions (Signed)
Medication Instructions:  Your physician recommends that you continue on your current medications as directed. Please refer to the Current Medication list given to you today.  *If you need a refill on your cardiac medications before your next appointment, please call your pharmacy*  Testing/Procedures: Your physician has requested that you have an echocardiogram. Echocardiography is a painless test that uses sound waves to create images of your heart. It provides your doctor with information about the size and shape of your heart and how well your heart's chambers and valves are working. This procedure takes approximately one hour. There are no restrictions for this procedure. Please do NOT wear cologne, perfume, aftershave, or lotions (deodorant is allowed). Please arrive 15 minutes prior to your appointment time.  Follow-Up: At [facility], you and your health needs are our priority.  As part of our continuing mission to provide you with exceptional heart care, we have created designated Provider Care Teams.  These Care Teams include your primary Cardiologist (physician) and Advanced Practice Providers (APPs -  Physician Assistants and Nurse Practitioners) who all work together to provide you with the care you need, when you need it.  Your next appointment:   1 year(s)  The format for your next appointment:   In Person  Provider:   Steffanie Dunn, MD  Important Information About Sugar

## 2022-05-25 NOTE — Progress Notes (Addendum)
Electrophysiology Office Follow up Visit Note:    Date:  05/25/2022   ID:  Luis [name], DOB [birth year], MRN [id number]  PCP:  Marva Panda, NP  [facility] Cardiologist:  None  [facility] Electrophysiologist:  [name], MD    Interval History:    Luis [name] is a 61 y.o. male who presents for a follow up visit.  He has a leadless pacemaker that was implanted in July 2022.  I have not seen him in follow-up since that time.  Remote interrogations after implant have shown stable device function.   Today the patient states that he has been feeling tired. He has not been sleeping well. He notes that he has gained up to 40 pounds since his last visit.  He denies any palpitations, chest pain, or shortness of breath. No lightheadedness, headaches, syncope, orthopnea, or PND.    Past Medical History:  Diagnosis Date   Diabetes mellitus without complication (HCC)    Diverticulitis    DVT (deep venous thrombosis) (HCC)    ETOH abuse    H/O colectomy    Hyperlipidemia    Hypertension    Smoker     Past Surgical History:  Procedure Laterality Date   COLOSTOMY     PACEMAKER LEADLESS INSERTION N/A 12/31/2020   Procedure: PACEMAKER LEADLESS INSERTION;  Surgeon: [name], MD;  Location: [facility];  Service: Cardiovascular;  Laterality: N/A;   TONSILLECTOMY      Current Medications: No outpatient medications have been marked as taking for the 05/25/22 encounter (Appointment) with [name], MD.     Allergies:   Lisinopril and Codeine   Social History   Socioeconomic History   Marital status: Single    Spouse name: Not on file   Number of children: Not on file   Years of education: Not on file   Highest education level: Not on file  Occupational History   Occupation: Pt states not able to work  Tobacco Use   Smoking status: Every Day    Packs/day: 1.00    Years: 44.00    Total pack years: 44.00    Types: Cigarettes    Smokeless tobacco: Current  Vaping Use   Vaping Use: Never used  Substance and Sexual Activity   Alcohol use: Yes    Comment: Pt states drinks 7 pints of liquor weekly   Drug use: Yes    Types: Marijuana   Sexual activity: Not Currently  Other Topics Concern   Not on file  Social History Narrative   Not on file   Social Determinants of Health   Financial Resource Strain: Not on file  Food Insecurity: Not on file  Transportation Needs: Not on file  Physical Activity: Not on file  Stress: Not on file  Social Connections: Not on file     Family History: The patient's family history includes COPD in his mother; Prostate cancer in his father.  ROS:   Please see the history of present illness.    (+) Bilateral LE Edema  EKGs/Labs/Other Studies Reviewed:    The following studies were reviewed today:  May 25, 2022 in clinic device interrogation personally reviewed Presenting rhythm V sensed at 50.  Battery longevity greater than 8 years.  Ventricular pacing 99%  EKG:  EKG is personally reviewed. 05/25/2022: Sinus rhythm with RV pacing.  Inconsistent a sensing.   Recent Labs: 10/02/2021: ALT 37; BUN 13; Creatinine, Ser 0.86; Hemoglobin 12.9; Platelets 246; Potassium 3.5; Sodium 137  Recent Lipid Panel    Component Value Date/Time   CHOL 140 12/26/2020 0144   TRIG 196 (H) 12/26/2020 0144   HDL 48 12/26/2020 0144   CHOLHDL 2.9 12/26/2020 0144   VLDL 39 12/26/2020 0144   LDLCALC 53 12/26/2020 0144    Physical Exam:    VS:  There were no vitals taken for this visit.    Wt Readings from Last 3 Encounters:  12/09/21 221 lb 12.8 oz (100.6 kg)  05/14/21 208 lb (94.3 kg)  03/01/21 202 lb 6.4 oz (91.8 kg)     GEN: Obese.  Chronically ill-appearing HEENT: Normal NECK: No JVD; No carotid bruits LYMPHATICS: No lymphadenopathy CARDIAC: RRR, no murmurs, rubs, gallops RESPIRATORY:  Clear to auscultation without rales, wheezing or rhonchi  ABDOMEN: Soft, non-tender,  distended (chronic) MUSCULOSKELETAL:  No edema; No deformity  SKIN: Warm and dry NEUROLOGIC:  Alert and oriented x 3 PSYCHIATRIC:  Normal affect        ASSESSMENT:    1. 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 Cardiac pacemaker in situ    PLAN:    In order of problems listed above:   #Complete heart block #Leadless pacemaker in situ Device functioning appropriately.  Continue remote monitoring.  #Fatigue Unclear cause.  He is chronically ill which may be contributing.  His obesity may be playing a role.  I will get an echo to make sure his LV function has remained stable with RV pacing.  Follow-up in 1 year  Medication Adjustments/Labs and Tests Ordered: Current medicines are reviewed at length with the patient today.  Concerns regarding medicines are outlined above.   No orders of the defined types were placed in this encounter.  No orders of the defined types were placed in this encounter.  I,Fatima Elhorry,acting as a Neurosurgeon for [name], MD.,have documented all relevant documentation on the behalf of [name], MD,as directed by  [name], MD while in the presence of [name], MD.   Signed, Steffanie Dunn, MD, [facility] 05/25/2022 12:55 PM    Electrophysiology [facility]

## 2022-05-25 NOTE — Progress Notes (Deleted)
Electrophysiology Office Follow up Visit Note:    Date:  05/25/2022   ID:  Luis [name], DOB [birth year], MRN [id number]  PCP:  Marva Panda, NP  [facility] Cardiologist:  None  [facility] Electrophysiologist:  [name], MD    Interval History:    Luis [name] is a 61 y.o. male who presents for a follow up visit.  He has a leadless pacemaker that was implanted in July 2022.  I have not seen him in follow-up since that time.  Remote interrogations after implant have shown stable device function.       Past Medical History:  Diagnosis Date   Diabetes mellitus without complication (HCC)    Diverticulitis    DVT (deep venous thrombosis) (HCC)    ETOH abuse    H/O colectomy    Hyperlipidemia    Hypertension    Smoker     Past Surgical History:  Procedure Laterality Date   COLOSTOMY     PACEMAKER LEADLESS INSERTION N/A 12/31/2020   Procedure: PACEMAKER LEADLESS INSERTION;  Surgeon: [name], MD;  Location: [facility];  Service: Cardiovascular;  Laterality: N/A;   TONSILLECTOMY      Current Medications: No outpatient medications have been marked as taking for the 05/25/22 encounter (Appointment) with [name], MD.     Allergies:   Lisinopril and Codeine   Social History   Socioeconomic History   Marital status: Single    Spouse name: Not on file   Number of children: Not on file   Years of education: Not on file   Highest education level: Not on file  Occupational History   Occupation: Pt states not able to work  Tobacco Use   Smoking status: Every Day    Packs/day: 1.00    Years: 44.00    Total pack years: 44.00    Types: Cigarettes   Smokeless tobacco: Current  Vaping Use   Vaping Use: Never used  Substance and Sexual Activity   Alcohol use: Yes    Comment: Pt states drinks 7 pints of liquor weekly   Drug use: Yes    Types: Marijuana   Sexual activity: Not Currently  Other Topics Concern   Not on file   Social History Narrative   Not on file   Social Determinants of Health   Financial Resource Strain: Not on file  Food Insecurity: Not on file  Transportation Needs: Not on file  Physical Activity: Not on file  Stress: Not on file  Social Connections: Not on file     Family History: The patient's family history includes COPD in his mother; Prostate cancer in his father.  ROS:   Please see the history of present illness.    All other systems reviewed and are negative.  EKGs/Labs/Other Studies Reviewed:    The following studies were reviewed today:  May 25, 2022 in clinic device interrogation personally reviewed ***  EKG:  The ekg ordered today demonstrates ***  Recent Labs: 10/02/2021: ALT 37; BUN 13; Creatinine, Ser 0.86; Hemoglobin 12.9; Platelets 246; Potassium 3.5; Sodium 137  Recent Lipid Panel    Component Value Date/Time   CHOL 140 12/26/2020 0144   TRIG 196 (H) 12/26/2020 0144   HDL 48 12/26/2020 0144   CHOLHDL 2.9 12/26/2020 0144   VLDL 39 12/26/2020 0144   LDLCALC 53 12/26/2020 0144    Physical Exam:    VS:  There were no vitals taken for this visit.    Wt  Readings from Last 3 Encounters:  12/09/21 221 lb 12.8 oz (100.6 kg)  05/14/21 208 lb (94.3 kg)  03/01/21 202 lb 6.4 oz (91.8 kg)     GEN: *** Well nourished, well developed in no acute distress HEENT: Normal NECK: No JVD; No carotid bruits LYMPHATICS: No lymphadenopathy CARDIAC: ***RRR, no murmurs, rubs, gallops RESPIRATORY:  Clear to auscultation without rales, wheezing or rhonchi  ABDOMEN: Soft, non-tender, non-distended MUSCULOSKELETAL:  No edema; No deformity  SKIN: Warm and dry NEUROLOGIC:  Alert and oriented x 3 PSYCHIATRIC:  Normal affect        ASSESSMENT:    1. 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 Cardiac pacemaker in situ    PLAN:    In order of problems listed above:   #Complete heart block #Leadless pacemaker in situ Device functioning appropriately.  Continue  remote monitoring.  Follow-up 1 year or sooner as needed.        Total time spent with patient today *** minutes. This includes reviewing records, evaluating the patient and coordinating care.   Medication Adjustments/Labs and Tests Ordered: Current medicines are reviewed at length with the patient today.  Concerns regarding medicines are outlined above.  No orders of the defined types were placed in this encounter.  No orders of the defined types were placed in this encounter.    Signed, Steffanie Dunn, MD, [facility], [facility] 05/25/2022 7:42 AM    Electrophysiology [facility]

## 2022-05-31 ENCOUNTER — Ambulatory Visit (HOSPITAL_COMMUNITY): Payer: Medicaid Other | Admitting: Nurse Practitioner

## 2022-06-07 ENCOUNTER — Ambulatory Visit (HOSPITAL_COMMUNITY)
Admission: RE | Admit: 2022-06-07 | Discharge: 2022-06-07 | Disposition: A | Discharge status: Home / Self Care | Payer: Medicaid Other | Source: Ambulatory Visit | Attending: Nurse Practitioner | Admitting: Nurse Practitioner

## 2022-06-07 DIAGNOSIS — Z932 Ileostomy status: Secondary | ICD-10-CM | POA: Diagnosis not present

## 2022-06-07 DIAGNOSIS — Z432 Encounter for attention to ileostomy: Secondary | ICD-10-CM | POA: Insufficient documentation

## 2022-06-07 DIAGNOSIS — T8189XS Other complications of procedures, not elsewhere classified, sequela: Secondary | ICD-10-CM

## 2022-06-07 DIAGNOSIS — K9413 Enterostomy malfunction: Secondary | ICD-10-CM | POA: Diagnosis not present

## 2022-06-07 DIAGNOSIS — L24B3 Irritant contact dermatitis related to fecal or urinary stoma or fistula: Secondary | ICD-10-CM | POA: Diagnosis not present

## 2022-06-07 NOTE — Discharge Instructions (Signed)
Follow up after surgeon appointment.

## 2022-06-07 NOTE — Progress Notes (Signed)
[facility]   Reason for visit:  Follow up wounds, ileostomy care HPI:   Past Medical History:  Diagnosis Date   Diabetes mellitus without complication (HCC)    Diverticulitis    DVT (deep venous thrombosis) (HCC)    ETOH abuse    H/O colectomy    Hyperlipidemia    Hypertension    Smoker    Family History  Problem Relation Age of Onset   COPD Mother    Prostate cancer Father    Allergies  Allergen Reactions   Lisinopril     Other reaction(s): renal effects   Codeine Other (See Comments) and Hives    unknown Other reaction(s): Other (See Comments) unknown unknown   Current Outpatient Medications  Medication Sig Dispense Refill Last Dose   acetaminophen (TYLENOL) 650 MG CR tablet Take 650 mg by mouth every 8 (eight) hours as needed for pain.       amLODipine (NORVASC) 10 MG tablet Take 10 mg by mouth daily.      atorvastatin (LIPITOR) 10 MG tablet Take 10 mg by mouth daily.      folic acid (FOLVITE) 1 MG tablet TAKE 1 TABLET(1 MG) BY MOUTH DAILY 90 tablet 3    metFORMIN (GLUCOPHAGE) 1000 MG tablet Take 500 mg by mouth daily.       metoprolol succinate (TOPROL-XL) 50 MG 24 hr tablet TAKE 1 TABLET(50 MG) BY MOUTH DAILY 90 tablet 2    Multiple Vitamin (MULTIVITAMIN WITH MINERALS) TABS tablet Take 1 tablet by mouth every other day.       thiamine 100 MG tablet Take 1 tablet (100 mg total) by mouth daily. 30 tablet 6    traMADol (ULTRAM) 50 MG tablet Take 1 tablet (50 mg total) by mouth every 6 (six) hours as needed for moderate pain. 30 tablet 0    triamcinolone cream (KENALOG) 0.5 % as directed.      No current facility-administered medications for this encounter.   ROS  Review of Systems  Gastrointestinal:        Abdominal hernia Nonhealing surgical wound RLQ ileostomy  Skin:  Positive for color change, rash and wound.       Abdominal and peristomal skin  All other systems reviewed and are negative.  Vital signs:  BP (!) 144/101 (BP Location: Right  Arm)   Pulse 96   Temp 98.4 F (36.9 C) (Oral)   Resp 18   SpO2 96%  Exam:  Physical Exam Vitals reviewed.  Constitutional:      Appearance: He is obese.  Abdominal:     Hernia: A hernia is present.     Comments: Nonhealing wounds in the setting of large abdominal hernia  Skin:    General: Skin is warm and dry.     Findings: Erythema, lesion and rash present.  Neurological:     Mental Status: He is alert and oriented to person, place, and time.  Psychiatric:        Mood and Affect: Mood normal.        Behavior: Behavior normal.     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   Maple Hudson FNP-BC

## 2022-06-09 DIAGNOSIS — K432 Incisional hernia without obstruction or gangrene: Secondary | ICD-10-CM | POA: Diagnosis not present

## 2022-06-09 DIAGNOSIS — E119 Type 2 diabetes mellitus without complications: Secondary | ICD-10-CM | POA: Diagnosis not present

## 2022-06-09 DIAGNOSIS — Z932 Ileostomy status: Secondary | ICD-10-CM | POA: Diagnosis not present

## 2022-06-09 DIAGNOSIS — Z87891 Personal history of nicotine dependence: Secondary | ICD-10-CM | POA: Diagnosis not present

## 2022-06-09 DIAGNOSIS — F1011 Alcohol abuse, in remission: Secondary | ICD-10-CM | POA: Diagnosis not present

## 2022-06-09 DIAGNOSIS — Z7689 Persons encountering health services in other specified circumstances: Secondary | ICD-10-CM | POA: Diagnosis not present

## 2022-06-13 DIAGNOSIS — Z419 Encounter for procedure for purposes other than remedying health state, unspecified: Secondary | ICD-10-CM | POA: Diagnosis not present

## 2022-06-14 ENCOUNTER — Ambulatory Visit (HOSPITAL_COMMUNITY)
Admission: RE | Admit: 2022-06-14 | Discharge: 2022-06-14 | Disposition: A | Discharge status: Home / Self Care | Payer: Medicaid Other | Source: Ambulatory Visit | Attending: Nurse Practitioner | Admitting: Nurse Practitioner

## 2022-06-14 DIAGNOSIS — T8189XS Other complications of procedures, not elsewhere classified, sequela: Secondary | ICD-10-CM

## 2022-06-14 DIAGNOSIS — L24B3 Irritant contact dermatitis related to fecal or urinary stoma or fistula: Secondary | ICD-10-CM

## 2022-06-14 DIAGNOSIS — T8189XA Other complications of procedures, not elsewhere classified, initial encounter: Secondary | ICD-10-CM | POA: Diagnosis not present

## 2022-06-14 DIAGNOSIS — K469 Unspecified abdominal hernia without obstruction or gangrene: Secondary | ICD-10-CM | POA: Insufficient documentation

## 2022-06-14 DIAGNOSIS — Z932 Ileostomy status: Secondary | ICD-10-CM | POA: Diagnosis not present

## 2022-06-14 DIAGNOSIS — Y832 Surgical operation with anastomosis, bypass or graft as the cause of abnormal reaction of the patient, or of later complication, without mention of misadventure at the time of the procedure: Secondary | ICD-10-CM | POA: Insufficient documentation

## 2022-06-14 DIAGNOSIS — Z7689 Persons encountering health services in other specified circumstances: Secondary | ICD-10-CM | POA: Diagnosis not present

## 2022-06-14 DIAGNOSIS — K9413 Enterostomy malfunction: Secondary | ICD-10-CM

## 2022-06-14 DIAGNOSIS — K5792 Diverticulitis of intestine, part unspecified, without perforation or abscess without bleeding: Secondary | ICD-10-CM | POA: Insufficient documentation

## 2022-06-14 NOTE — Progress Notes (Signed)
[facility]   Reason for visit:  RLQ ileostomy Midline abdominal surgical wound, nonhealing Abdominal hernia HPI:  Diverticulitis Past Medical History:  Diagnosis Date   Diabetes mellitus without complication ([place])    Diverticulitis    DVT (deep venous thrombosis) (HCC)    ETOH abuse    H/O colectomy    Hyperlipidemia    Hypertension    Smoker    Family History  Problem Relation Age of Onset   COPD Mother    Prostate cancer Father    Allergies  Allergen Reactions   Lisinopril     Other reaction(s): renal effects   Codeine Other (See Comments) and Hives    unknown Other reaction(s): Other (See Comments) unknown unknown   Current Outpatient Medications  Medication Sig Dispense Refill Last Dose   acetaminophen (TYLENOL) 650 MG CR tablet Take 650 mg by mouth every 8 (eight) hours as needed for pain.       amLODipine (NORVASC) 10 MG tablet Take 10 mg by mouth daily.      atorvastatin (LIPITOR) 10 MG tablet Take 10 mg by mouth daily.      folic acid (FOLVITE) 1 MG tablet TAKE 1 TABLET(1 MG) BY MOUTH DAILY 90 tablet 3    metFORMIN (GLUCOPHAGE) 1000 MG tablet Take 500 mg by mouth daily.       metoprolol succinate (TOPROL-XL) 50 MG 24 hr tablet TAKE 1 TABLET(50 MG) BY MOUTH DAILY 90 tablet 2    Multiple Vitamin (MULTIVITAMIN WITH MINERALS) TABS tablet Take 1 tablet by mouth every other day.       thiamine 100 MG tablet Take 1 tablet (100 mg total) by mouth daily. 30 tablet 6    traMADol (ULTRAM) 50 MG tablet Take 1 tablet (50 mg total) by mouth every 6 (six) hours as needed for moderate pain. 30 tablet 0    triamcinolone cream (KENALOG) 0.5 % as directed.      No current facility-administered medications for this encounter.   ROS  Review of Systems  Gastrointestinal:        RLQ ileostomy Nonhealing surgical wound Abdominal hernia  Skin:  Positive for color change, rash and wound.  All other systems reviewed and are negative.  Vital signs:  BP (!)  176/103 (BP Location: Right Arm)   Pulse 95   Temp 98.6 F (37 C) (Oral)   Resp 20   SpO2 96%  Exam:  Physical Exam Vitals reviewed.  Constitutional:      Appearance: He is obese.  Abdominal:     Hernia: A hernia is present.     Comments: Nonhealing surgical wound  Skin:    General: Skin is warm and dry.     Findings: Erythema, lesion and rash present.  Neurological:     Mental Status: He is alert and oriented to person, place, and time.  Psychiatric:        Mood and Affect: Mood normal.        Behavior: Behavior normal.     Stoma type/location:  RLQ ileostomy Stomal assessment/size:  1" recessed Peristomal assessment:  denuded skin Treatment options for stomal/peristomal skin: stoma powder skin prep barrier strip Output: soft yellow stool Ostomy pouching: 1pc. Flat with barrier ring Education provided:  Consulted with surgery team who will begin process for ileostomy reversal.  WIll need to see [facility] first.   Will eventually go to [facility] for hernia repair.  Is positive and encouraged today.  Is trying to cut down  on alcohol and tobacco and understands this is critical to wound healing.     Impression/dx  Nonhealing wound Abdominal hernia RLQ ileostomy Discussion  See above Plan  Wound and ostomy care performed.  Bleeding from surgical wound, cauterized with silver nitrate.      Visit time: 50 minutes.   Domenic Moras FNP-BC

## 2022-06-14 NOTE — Discharge Instructions (Signed)
Follow up with [facility] Continue to work on lifestyle changes: Smoking cessation Alcohol reduction/cessation Weight maintenance

## 2022-06-21 ENCOUNTER — Ambulatory Visit (HOSPITAL_COMMUNITY): Payer: Medicaid Other | Admitting: Nurse Practitioner

## 2022-06-21 DIAGNOSIS — Z932 Ileostomy status: Secondary | ICD-10-CM | POA: Diagnosis not present

## 2022-06-21 DIAGNOSIS — S31109A Unspecified open wound of abdominal wall, unspecified quadrant without penetration into peritoneal cavity, initial encounter: Secondary | ICD-10-CM | POA: Diagnosis not present

## 2022-06-28 ENCOUNTER — Ambulatory Visit (HOSPITAL_COMMUNITY)
Admission: RE | Admit: 2022-06-28 | Discharge: 2022-06-28 | Disposition: A | Discharge status: Home / Self Care | Payer: Medicaid Other | Source: Ambulatory Visit | Attending: Nurse Practitioner | Admitting: Nurse Practitioner

## 2022-06-28 DIAGNOSIS — Z432 Encounter for attention to ileostomy: Secondary | ICD-10-CM | POA: Insufficient documentation

## 2022-06-28 DIAGNOSIS — K432 Incisional hernia without obstruction or gangrene: Secondary | ICD-10-CM | POA: Diagnosis not present

## 2022-06-28 DIAGNOSIS — K9413 Enterostomy malfunction: Secondary | ICD-10-CM | POA: Diagnosis not present

## 2022-06-28 DIAGNOSIS — K469 Unspecified abdominal hernia without obstruction or gangrene: Secondary | ICD-10-CM | POA: Insufficient documentation

## 2022-06-28 DIAGNOSIS — Z7689 Persons encountering health services in other specified circumstances: Secondary | ICD-10-CM | POA: Diagnosis not present

## 2022-06-28 DIAGNOSIS — L24B3 Irritant contact dermatitis related to fecal or urinary stoma or fistula: Secondary | ICD-10-CM | POA: Diagnosis not present

## 2022-06-28 NOTE — Progress Notes (Signed)
[facility]   Reason for visit:  RLQ ileostomy   midline nonhealing surgical wound  abdominal hernia HPI:   Past Medical History:  Diagnosis Date  . Diabetes mellitus without complication ([place])   . Diverticulitis   . DVT (deep venous thrombosis) ([employer])   . ETOH abuse   . H/O colectomy   . Hyperlipidemia   . Hypertension   . Smoker    Family History  Problem Relation Age of Onset  . COPD Mother   . Prostate cancer Father    Allergies  Allergen Reactions  . Lisinopril     Other reaction(s): renal effects  . Codeine Other (See Comments) and Hives    unknown Other reaction(s): Other (See Comments) unknown unknown   Current Outpatient Medications  Medication Sig Dispense Refill Last Dose  . acetaminophen (TYLENOL) 650 MG CR tablet Take 650 mg by mouth every 8 (eight) hours as needed for pain.      Marland Kitchen amLODipine (NORVASC) 10 MG tablet Take 10 mg by mouth daily.     Marland Kitchen atorvastatin (LIPITOR) 10 MG tablet Take 10 mg by mouth daily.     . folic acid (FOLVITE) 1 MG tablet TAKE 1 TABLET(1 MG) BY MOUTH DAILY 90 tablet 3   . metFORMIN (GLUCOPHAGE) 1000 MG tablet Take 500 mg by mouth daily.      . metoprolol succinate (TOPROL-XL) 50 MG 24 hr tablet TAKE 1 TABLET(50 MG) BY MOUTH DAILY 90 tablet 2   . Multiple Vitamin (MULTIVITAMIN WITH MINERALS) TABS tablet Take 1 tablet by mouth every other day.      . thiamine 100 MG tablet Take 1 tablet (100 mg total) by mouth daily. 30 tablet 6   . traMADol (ULTRAM) 50 MG tablet Take 1 tablet (50 mg total) by mouth every 6 (six) hours as needed for moderate pain. 30 tablet 0   . triamcinolone cream (KENALOG) 0.5 % as directed.      No current facility-administered medications for this encounter.   ROS  Review of Systems  Cardiovascular:        Pacemaker in place  Gastrointestinal:        Abdominal hernia RLQ ileostomy  Skin:  Positive for color change, rash and wound.       Peristomal and periwound breakdown Nonhealing surgical  wound to midline abdomen in the setting of abdominal hernia  Psychiatric/Behavioral:  The patient is nervous/anxious.        Anxious for surgery Attempting to reduce/eliminate alcohol and tobacco  All other systems reviewed and are negative. Vital signs:  BP (!) 153/83 (BP Location: Right Arm)   Pulse 92   Temp 98.5 F (36.9 C) (Oral)   Resp 18   SpO2 96%  Exam:  Physical Exam Vitals reviewed.  Constitutional:      Appearance: He is obese.  Abdominal:     Hernia: A hernia is present.     Comments: Recessed ileostomy  Skin:    General: Skin is warm and dry.     Findings: Erythema, lesion and rash present.  Neurological:     Mental Status: He is alert and oriented to person, place, and time.  Psychiatric:        Mood and Affect: Mood normal.        Behavior: Behavior normal.    Stoma type/location:  RLQ ileostomy Stomal assessment/size:  1" recessed Peristomal assessment:  chronic contact dermatitis  uses Skin TAc adhesive Treatment options for stomal/peristomal skin: barrier ring,  1 piece flat pouch   Output: liquid tan stool Ostomy pouching: 1pc. Pouch with barrier ring  Education provided:  Given additional supplies that he was low on. COntinues to struggle with supplies.  Ostomy and wound care supplies provided by different suppliers. Patient was telling one supplier he didn't need them thinking it was duplicative.  I think we have that all sorted now.     Impression/dx  Contact dermatitis Abdominal hernia Recessed ileostomy Discussion  Awaiting presurgical tests for ostomy reversal.   Continue wound care at home and in clinic COntinue to decrease/eliminate alcohol and tobacco use. Plan  See back in one week.     Visit time: 40 minutes.   Domenic Moras FNP-BC

## 2022-06-29 ENCOUNTER — Other Ambulatory Visit (HOSPITAL_COMMUNITY): Payer: Medicaid Other

## 2022-07-02 NOTE — Discharge Instructions (Signed)
Aquacel, ABD pads and wound cleanser provided today

## 2022-07-04 ENCOUNTER — Ambulatory Visit: Payer: Medicaid Other | Attending: Cardiology

## 2022-07-04 DIAGNOSIS — I442 Atrioventricular block, complete: Secondary | ICD-10-CM | POA: Diagnosis not present

## 2022-07-05 ENCOUNTER — Ambulatory Visit (HOSPITAL_COMMUNITY)
Admission: RE | Admit: 2022-07-05 | Discharge: 2022-07-05 | Disposition: A | Discharge status: Home / Self Care | Payer: Medicaid Other | Source: Ambulatory Visit | Attending: Nurse Practitioner | Admitting: Nurse Practitioner

## 2022-07-05 DIAGNOSIS — Z432 Encounter for attention to ileostomy: Secondary | ICD-10-CM | POA: Diagnosis not present

## 2022-07-05 DIAGNOSIS — Z932 Ileostomy status: Secondary | ICD-10-CM | POA: Diagnosis present

## 2022-07-05 DIAGNOSIS — L24B3 Irritant contact dermatitis related to fecal or urinary stoma or fistula: Secondary | ICD-10-CM

## 2022-07-05 DIAGNOSIS — K432 Incisional hernia without obstruction or gangrene: Secondary | ICD-10-CM

## 2022-07-05 DIAGNOSIS — L2489 Irritant contact dermatitis due to other agents: Secondary | ICD-10-CM | POA: Insufficient documentation

## 2022-07-05 DIAGNOSIS — T148XXA Other injury of unspecified body region, initial encounter: Secondary | ICD-10-CM

## 2022-07-05 DIAGNOSIS — K9413 Enterostomy malfunction: Secondary | ICD-10-CM | POA: Diagnosis not present

## 2022-07-05 DIAGNOSIS — Z7689 Persons encountering health services in other specified circumstances: Secondary | ICD-10-CM | POA: Diagnosis not present

## 2022-07-05 NOTE — Progress Notes (Signed)
[facility]   Reason for visit:  RLQ ileostomy HPI:  Perforated diverticulitis with abdominal hernia Past Medical History:  Diagnosis Date  . Diabetes mellitus without complication ([place])   . Diverticulitis   . DVT (deep venous thrombosis) ([place])   . ETOH abuse   . H/O colectomy   . Hyperlipidemia   . Hypertension   . Smoker    Family History  Problem Relation Age of Onset  . COPD Mother   . Prostate cancer Father    Allergies  Allergen Reactions  . Lisinopril     Other reaction(s): renal effects  . Codeine Other (See Comments) and Hives    unknown Other reaction(s): Other (See Comments) unknown unknown   Current Outpatient Medications  Medication Sig Dispense Refill Last Dose  . acetaminophen (TYLENOL) 650 MG CR tablet Take 650 mg by mouth every 8 (eight) hours as needed for pain.      Marland Kitchen amLODipine (NORVASC) 10 MG tablet Take 10 mg by mouth daily.     Marland Kitchen atorvastatin (LIPITOR) 10 MG tablet Take 10 mg by mouth daily.     . folic acid (FOLVITE) 1 MG tablet TAKE 1 TABLET(1 MG) BY MOUTH DAILY 90 tablet 3   . metFORMIN (GLUCOPHAGE) 1000 MG tablet Take 500 mg by mouth daily.      . metoprolol succinate (TOPROL-XL) 50 MG 24 hr tablet TAKE 1 TABLET(50 MG) BY MOUTH DAILY 90 tablet 2   . Multiple Vitamin (MULTIVITAMIN WITH MINERALS) TABS tablet Take 1 tablet by mouth every other day.      . thiamine 100 MG tablet Take 1 tablet (100 mg total) by mouth daily. 30 tablet 6   . traMADol (ULTRAM) 50 MG tablet Take 1 tablet (50 mg total) by mouth every 6 (six) hours as needed for moderate pain. 30 tablet 0   . triamcinolone cream (KENALOG) 0.5 % as directed.      No current facility-administered medications for this encounter.   ROS  Review of Systems  Cardiovascular:        Pacemaker in place  Gastrointestinal:        RLQ ileostomy Abdominal hernia  Skin:  Positive for color change, rash and wound.  Psychiatric/Behavioral: Negative.    All other systems reviewed  and are negative.  Vital signs:  BP (!) 147/90 (BP Location: Right Arm)   Pulse 97   Temp 98.5 F (36.9 C) (Oral)   Resp 18   SpO2 93%  Exam:  Physical Exam Vitals reviewed.  Constitutional:      Appearance: He is obese.  Abdominal:     Hernia: A hernia is present.  Skin:    General: Skin is warm and dry.     Findings: Erythema, lesion and rash present.  Neurological:     Mental Status: He is alert and oriented to person, place, and time.  Psychiatric:        Mood and Affect: Mood normal.        Behavior: Behavior normal.    Stoma type/location:  RLQ ileostomy Stomal assessment/size:  1" recessed Peristomal assessment:  contact dermatitis, pouch was leaking on arrival today.  Treatment options for stomal/peristomal skin: barrier ring, 1 piece pouch  skinTac adhesive Output: soft brown stool Ostomy pouching: 1pc flat with barrier ring Education provided: wound care performed, pouch change and assisted with supplies.     Impression/dx  Contact dermatitis Discussion  Peristomal skin is tender, red, weeping.  His pouch has been leaking  more often.  Hernia remains pronounced.   Aquacel Ag to open wounds, covered with gauze, ABD pads and tape  secured with ace bandage Plan  See back as needed 1-2 week    Visit time: 45 minutes.   Domenic Moras FNP-BC

## 2022-07-06 LAB — CUP PACEART REMOTE DEVICE CHECK
Battery Remaining Longevity: 95 mo
Battery Voltage: 3.02 V
Brady Statistic AS VP Percent: 97.91 %
Brady Statistic AS VS Percent: 0.01 %
Brady Statistic RV Percent Paced: 99.89 %
Date Time Interrogation Session: 20240120195356
Implantable Pulse Generator Implant Date: 20220721
Lead Channel Impedance Value: 560 Ohm
Lead Channel Pacing Threshold Amplitude: 0.25 V
Lead Channel Pacing Threshold Pulse Width: 0.24 ms
Lead Channel Sensing Intrinsic Amplitude: 19.463 mV
Lead Channel Setting Pacing Amplitude: 0.875
Lead Channel Setting Pacing Pulse Width: 0.24 ms
Lead Channel Setting Sensing Sensitivity: 2 mV

## 2022-07-09 NOTE — Discharge Instructions (Signed)
Continue wound care Ostomy care

## 2022-07-12 ENCOUNTER — Ambulatory Visit (HOSPITAL_COMMUNITY)
Admission: RE | Admit: 2022-07-12 | Discharge: 2022-07-12 | Disposition: A | Discharge status: Home / Self Care | Payer: Medicaid Other | Source: Ambulatory Visit | Attending: *Deleted | Admitting: *Deleted

## 2022-07-12 DIAGNOSIS — T148XXA Other injury of unspecified body region, initial encounter: Secondary | ICD-10-CM | POA: Diagnosis not present

## 2022-07-12 DIAGNOSIS — T8189XA Other complications of procedures, not elsewhere classified, initial encounter: Secondary | ICD-10-CM | POA: Diagnosis not present

## 2022-07-12 DIAGNOSIS — Z432 Encounter for attention to ileostomy: Secondary | ICD-10-CM | POA: Insufficient documentation

## 2022-07-12 DIAGNOSIS — K9413 Enterostomy malfunction: Secondary | ICD-10-CM

## 2022-07-12 DIAGNOSIS — Z7689 Persons encountering health services in other specified circumstances: Secondary | ICD-10-CM | POA: Diagnosis not present

## 2022-07-12 DIAGNOSIS — L24B3 Irritant contact dermatitis related to fecal or urinary stoma or fistula: Secondary | ICD-10-CM | POA: Diagnosis not present

## 2022-07-12 DIAGNOSIS — K4020 Bilateral inguinal hernia, without obstruction or gangrene, not specified as recurrent: Secondary | ICD-10-CM

## 2022-07-12 DIAGNOSIS — K469 Unspecified abdominal hernia without obstruction or gangrene: Secondary | ICD-10-CM | POA: Insufficient documentation

## 2022-07-12 NOTE — Progress Notes (Signed)
[facility]   Reason for visit:  RLQ ileostomy abdominal hernia and nonhealing surgical wound HPI:  Perforated diverticulitis Past Medical History:  Diagnosis Date   Diabetes mellitus without complication ([place])    Diverticulitis    DVT (deep venous thrombosis) ([place])    x3   ETOH abuse    H/O colectomy    Hyperlipidemia    Hypertension    Smoker    Family History  Problem Relation Age of Onset   COPD Mother    Prostate cancer Father    Colon cancer Neg Hx    Allergies  Allergen Reactions   Lisinopril     Other reaction(s): renal effects   Codeine Other (See Comments) and Hives    unknown Other reaction(s): Other (See Comments) unknown unknown   Current Outpatient Medications  Medication Sig Dispense Refill Last Dose   acetaminophen (TYLENOL) 650 MG CR tablet Take 650 mg by mouth every 8 (eight) hours as needed for pain.       amLODipine (NORVASC) 10 MG tablet Take 10 mg by mouth daily.      atorvastatin (LIPITOR) 10 MG tablet Take 10 mg by mouth daily.      folic acid (FOLVITE) 1 MG tablet TAKE 1 TABLET(1 MG) BY MOUTH DAILY 90 tablet 3    metFORMIN (GLUCOPHAGE) 1000 MG tablet Take 500 mg by mouth daily.       metoprolol succinate (TOPROL-XL) 50 MG 24 hr tablet TAKE 1 TABLET(50 MG) BY MOUTH DAILY 90 tablet 2    Multiple Vitamin (MULTIVITAMIN WITH MINERALS) TABS tablet Take 1 tablet by mouth every other day.       Na Sulfate-K Sulfate-Mg Sulf (SUPREP BOWEL PREP KIT) 17.5-3.13-1.6 GM/177ML SOLN Take 1 kit by mouth as directed. 324 mL 0    thiamine 100 MG tablet Take 1 tablet (100 mg total) by mouth daily. (Patient not taking: Reported on 07/14/2022) 30 tablet 6    traMADol (ULTRAM) 50 MG tablet Take 1 tablet (50 mg total) by mouth every 6 (six) hours as needed for moderate pain. (Patient not taking: Reported on 07/14/2022) 30 tablet 0    triamcinolone cream (KENALOG) 0.5 % as directed. (Patient not taking: Reported on 07/14/2022)      No current  facility-administered medications for this encounter.   ROS  Review of Systems  Constitutional:  Positive for chills and fever.       Reports fever, chills and congestions last night.  Vs normal this AM.  Lungs clear  Respiratory: Negative.         Reports short of breath last night, breathing normally today.   Gastrointestinal:        Hernia RLQ ileostomy Nonhealing surgical wound  Skin:  Positive for color change, rash and wound.  Psychiatric/Behavioral: Negative.    All other systems reviewed and are negative.  Vital signs:  BP (!) 160/88   Pulse 95   Temp 98.3 F (36.8 C)   Resp 20   SpO2 96%  Exam:  Physical Exam Constitutional:      Appearance: He is obese.  Cardiovascular:     Comments: Pacemaker in place Abdominal:     Hernia: A hernia is present.     Comments: Ileostomy Nonhealing surgical wound, midline abdomen  Skin:    General: Skin is warm and dry.     Findings: Erythema, lesion and rash present.  Neurological:     Mental Status: He is alert and oriented to person, place, and  time.  Psychiatric:        Mood and Affect: Mood normal.        Behavior: Behavior normal.     Stoma type/location:  1" recessed  Stomal assessment/size:  1"  Peristomal assessment:  contact dermatitis Treatment options for stomal/peristomal skin: barrier ring, skin TAC and 1 piece flat pouch Output: liquid green stool Ostomy pouching: 1pc. Education provided:  awaiting call from [facility] for additional testing in regards to reversal.     Impression/dx  Contact dermatitis Abdominal hernia  ileostomy Discussion  Bleeding today from open abdominal lesions.  Cleansed gently and added silver hydrofiber. Plan  See back in 1-2 weeks.  Ongoing monitoring of dermatitis , hernia and ileostomy      Visit time: 50 minutes.   Domenic Moras FNP-BC

## 2022-07-14 ENCOUNTER — Encounter: Payer: Self-pay | Admitting: Internal Medicine

## 2022-07-14 ENCOUNTER — Ambulatory Visit (INDEPENDENT_AMBULATORY_CARE_PROVIDER_SITE_OTHER): Payer: Medicaid Other | Admitting: Internal Medicine

## 2022-07-14 VITALS — BP 132/78 | HR 102 | Ht 73.0 in | Wt 244.0 lb

## 2022-07-14 DIAGNOSIS — Z01818 Encounter for other preprocedural examination: Secondary | ICD-10-CM | POA: Diagnosis not present

## 2022-07-14 DIAGNOSIS — Z1211 Encounter for screening for malignant neoplasm of colon: Secondary | ICD-10-CM

## 2022-07-14 DIAGNOSIS — R7989 Other specified abnormal findings of blood chemistry: Secondary | ICD-10-CM | POA: Diagnosis not present

## 2022-07-14 DIAGNOSIS — Z8719 Personal history of other diseases of the digestive system: Secondary | ICD-10-CM

## 2022-07-14 DIAGNOSIS — Z419 Encounter for procedure for purposes other than remedying health state, unspecified: Secondary | ICD-10-CM | POA: Diagnosis not present

## 2022-07-14 DIAGNOSIS — Z7689 Persons encountering health services in other specified circumstances: Secondary | ICD-10-CM | POA: Diagnosis not present

## 2022-07-14 MED ORDER — NA SULFATE-K SULFATE-MG SULF 17.5-3.13-1.6 GM/177ML PO SOLN: 1.0000 | ORAL | 0 refills | Status: DC

## 2022-07-14 NOTE — Progress Notes (Signed)
Chief Complaint: Discuss colonoscopy  HPI : 62 year old male with history of DM, diverticulitis s/p colectomy with ileostomy in 2020, DVT (no longer on anticoagulation), complete heart block s/p PM placement, and EtOH use presents to discuss colonoscopy  He follows with Dr. Thermon Leyland for consideration of ileostomy reversal. He developed a hernia after his colectomy with ileostomy surgery in 2020 and has had multiple complications including abdominal wall dehiscence and infections. He passes a normal amount of ostomy output. He has scant rectal output, mostly with mucous. Denies blood in ostomy. He eats well. Endorses nausea for a few minutes every morning. Denies vomiting for the most part. He has abdominal pain over his entire abdomen. His ostomy feels raw. He has to wear a wrap, which helps hold his hernia in. He has a few wounds over the front his abdomen, which he keeps wrapped with a gauze. He has been working with the ostomy nurse every week. Denies family history of colon cancer. He drinks alcohol with on average 1 beer per day. Denies dysphagia. He has never had a colonoscopy.  Wt Readings from Last 3 Encounters:  07/14/22 244 lb (110.7 kg)  05/25/22 239 lb (108.4 kg)  12/09/21 221 lb 12.8 oz (100.6 kg)   Past Medical History:  Diagnosis Date   Diabetes mellitus without complication ([place])    Diverticulitis    DVT (deep venous thrombosis) ([place])    x3   ETOH abuse    H/O colectomy    Hyperlipidemia    Hypertension    Smoker      Past Surgical History:  Procedure Laterality Date   COLOSTOMY     PACEMAKER LEADLESS INSERTION N/A 12/31/2020   Procedure: PACEMAKER LEADLESS INSERTION;  Surgeon: Vickie Epley, MD;  Location: [facility];  Service: Cardiovascular;  Laterality: N/A;   TONSILLECTOMY     Family History  Problem Relation Age of Onset   COPD Mother    Prostate cancer Father    Colon cancer Neg Hx    Social History   Tobacco Use   Smoking status:  Every Day    Packs/day: 1.00    Years: 44.00    Total pack years: 44.00    Types: Cigarettes   Smokeless tobacco: Current  Vaping Use   Vaping Use: Never used  Substance Use Topics   Alcohol use: Yes    Comment: 1-24 pt drinks 5 drinks a week   Drug use: Yes    Types: Marijuana   Current Outpatient Medications  Medication Sig Dispense Refill   acetaminophen (TYLENOL) 650 MG CR tablet Take 650 mg by mouth every 8 (eight) hours as needed for pain.      amLODipine (NORVASC) 10 MG tablet Take 10 mg by mouth daily.     atorvastatin (LIPITOR) 10 MG tablet Take 10 mg by mouth daily.     folic acid (FOLVITE) 1 MG tablet TAKE 1 TABLET(1 MG) BY MOUTH DAILY 90 tablet 3   metFORMIN (GLUCOPHAGE) 1000 MG tablet Take 500 mg by mouth daily.      metoprolol succinate (TOPROL-XL) 50 MG 24 hr tablet TAKE 1 TABLET(50 MG) BY MOUTH DAILY 90 tablet 2   Multiple Vitamin (MULTIVITAMIN WITH MINERALS) TABS tablet Take 1 tablet by mouth every other day.      thiamine 100 MG tablet Take 1 tablet (100 mg total) by mouth daily. (Patient not taking: Reported on 07/14/2022) 30 tablet 6   traMADol (ULTRAM) 50 MG tablet Take 1 tablet (  50 mg total) by mouth every 6 (six) hours as needed for moderate pain. (Patient not taking: Reported on 07/14/2022) 30 tablet 0   triamcinolone cream (KENALOG) 0.5 % as directed. (Patient not taking: Reported on 07/14/2022)     No current facility-administered medications for this visit.   Allergies  Allergen Reactions   Lisinopril     Other reaction(s): renal effects   Codeine Other (See Comments) and Hives    unknown Other reaction(s): Other (See Comments) unknown unknown   Review of Systems: All systems reviewed and negative except where noted in HPI.   Physical Exam: BP (!) 150/82   Pulse (!) 102   Ht 6\' 1"  (1.854 m)   Wt 244 lb (110.7 kg)   SpO2 96%   BMI 32.19 kg/m  Constitutional: Pleasant,well-developed, male in no acute distress. HEENT: Normocephalic and atraumatic.  Conjunctivae are normal. No scleral icterus. Cardiovascular: Mildly elevated heart rate Pulmonary/chest: Effort normal. Abdominal: Soft, significant distension. His abdomen protrudes and is covered with gauze and an abdominal wrap. Right sided ileostomy is present and has appropriate output.  Extremities: No edema Neurological: Alert and oriented to person place and time. Skin: Skin is warm and dry. No rashes noted. Psychiatric: Normal mood and affect. Behavior is normal.  Labs 09/2021: CMP with mildly elevated AST of 58. CBC with mildly elevated WBC of 10.7, low Hb of 12.9, and nml plts of 246. INR nml. Ethanol elevated at 291.   CT A/P w/contrast 12/02/19: IMPRESSION: Increased size of large wide-mouth ventral abdominal wall hernia containing numerous small bowel loops. No evidence of bowel obstruction or strangulation. Aortic Atherosclerosis (ICD10-I70.0).  CT A/P w/contrast 12/25/20: IMPRESSION: No evidence of pulmonary embolism. Mild interstitial edema. Small to moderate bilateral pleural effusions, right greater than left. Associated lower lobe compressive atelectasis. 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 Pilar Plate diastasis/laxity of the anterior abdominal wall with herniation of numerous loops of nondilated small bowel. Overlying skin thickening with trace subcutaneous fluid suggests cellulitis, but without drainable fluid collection/abscess.  CT A/P w/contrast 04/01/21: IMPRESSION: 1. No acute abdominal/pelvic findings, mass lesions or adenopathy. 2. Stable surgical changes with near total colectomy and right lower quadrant ileostomy and Hartmann's pouch. 3. Large stable anterior abdominal wall hernia containing most of the bowel but no findings to suggest incarceration or obstruction. 4. Advanced atherosclerotic calcifications involving the aorta and iliac arteries. Aortic Atherosclerosis (ICD10-I70.0).  ASSESSMENT AND PLAN: History of  diverticulitis s/p colectomy with ileostomy Mildly elevated AST, likely due to fatty liver from alcohol use Presents with a history of diverticulitis status post a total abdominal colectomy with ileostomy in 2020.  He has never had a colonoscopy in the past, and he is currently being considered for ileostomy reversal.  His surgeon has requested that a colonoscopy be done in order to rule out any underlying colon cancer.  Thus we will plan to proceed with colonoscopy for further evaluation. Patient does have a history of significant alcohol use and his AST is mildly elevated, suspected to be due to underlying alcoholic liver disease. - Colonoscopy [facility]. Will plan to use pediatric colonoscope to look briefly into his ileostomy and to examine his Hartmann's pouch. - Consider further evaluation with U/S with elastography in the future   Christia Reading, MD  I spent 51 minutes of time, including in depth chart review, independent review of results as outlined above, communicating results with the patient directly, face-to-face time with the patient, coordinating care, ordering studies and medications as appropriate,  and documentation.

## 2022-07-14 NOTE — Patient Instructions (Addendum)
_______________________________________________________  If your blood pressure at your visit was 140/90 or greater, please contact your primary care physician to follow up on this.  _______________________________________________________ If you are age 63 or younger, your body mass index should be between 19-25. Your Body mass index is 32.19 kg/m. If this is out of the aformentioned range listed, please consider follow up with your Primary Care Provider.  ________________________________________________________  The [facility] providers would like to encourage you to use [facility] to communicate with providers for non-urgent requests or questions.  Due to long hold times on the telephone, sending your provider a message by [facility] may be a faster and more efficient way to get a response.  Please allow 48 business hours for a response.  Please remember that this is for non-urgent requests.  _______________________________________________________  Dennis Bast have been scheduled for a colonoscopy. Please follow written instructions given to you at your visit today.  Please pick up your prep supplies at the pharmacy within the next 1-3 days. If you use inhalers (even only as needed), please bring them with you on the day of your procedure.  [facility] [phone]  Please use 2 fleet enemas at 12:30pm prior to procedure.  Due to recent changes in healthcare laws, you may see the results of your imaging and laboratory studies on MyChart before your provider has had a chance to review them.  We understand that in some cases there may be results that are confusing or concerning to you. Not all laboratory results come back in the same time frame and the provider may be waiting for multiple results in order to interpret others.  Please give [place] 48 hours in order for your provider to thoroughly review all the results before contacting the office for clarification of your results.   Thank you for  entrusting me with your care and choosing [facility].  Dr Lorenso Courier

## 2022-07-16 NOTE — Discharge Instructions (Signed)
Follow up with [facility] regarding reversal

## 2022-07-19 ENCOUNTER — Ambulatory Visit (HOSPITAL_COMMUNITY)
Admission: RE | Admit: 2022-07-19 | Discharge: 2022-07-19 | Disposition: A | Discharge status: Home / Self Care | Payer: Medicaid Other | Source: Ambulatory Visit | Attending: Nurse Practitioner | Admitting: Nurse Practitioner

## 2022-07-19 DIAGNOSIS — X58XXXA Exposure to other specified factors, initial encounter: Secondary | ICD-10-CM | POA: Diagnosis not present

## 2022-07-19 DIAGNOSIS — K9413 Enterostomy malfunction: Secondary | ICD-10-CM | POA: Diagnosis not present

## 2022-07-19 DIAGNOSIS — L259 Unspecified contact dermatitis, unspecified cause: Secondary | ICD-10-CM | POA: Insufficient documentation

## 2022-07-19 DIAGNOSIS — K9419 Other complications of enterostomy: Secondary | ICD-10-CM | POA: Insufficient documentation

## 2022-07-19 DIAGNOSIS — L24B3 Irritant contact dermatitis related to fecal or urinary stoma or fistula: Secondary | ICD-10-CM

## 2022-07-19 DIAGNOSIS — T8189XA Other complications of procedures, not elsewhere classified, initial encounter: Secondary | ICD-10-CM | POA: Diagnosis not present

## 2022-07-19 DIAGNOSIS — Z7689 Persons encountering health services in other specified circumstances: Secondary | ICD-10-CM | POA: Diagnosis not present

## 2022-07-19 DIAGNOSIS — T8189XD Other complications of procedures, not elsewhere classified, subsequent encounter: Secondary | ICD-10-CM

## 2022-07-19 DIAGNOSIS — K435 Parastomal hernia without obstruction or  gangrene: Secondary | ICD-10-CM | POA: Insufficient documentation

## 2022-07-19 DIAGNOSIS — K469 Unspecified abdominal hernia without obstruction or gangrene: Secondary | ICD-10-CM | POA: Diagnosis not present

## 2022-07-19 NOTE — Progress Notes (Signed)
[facility]   Reason for visit:  RLQ ileostomy  nonhealing midline surgical wound parastomal hernia HPI:  diverticulitis  Family History  Problem Relation Age of Onset   COPD Mother    Prostate cancer Father    Colon cancer Neg Hx    Allergies  Allergen Reactions   Lisinopril     Other reaction(s): renal effects   Codeine Other (See Comments) and Hives    unknown Other reaction(s): Other (See Comments) unknown unknown   Current Outpatient Medications  Medication Sig Dispense Refill Last Dose   acetaminophen (TYLENOL) 650 MG CR tablet Take 650 mg by mouth every 8 (eight) hours as needed for pain.       amLODipine (NORVASC) 10 MG tablet Take 10 mg by mouth daily.      atorvastatin (LIPITOR) 10 MG tablet Take 10 mg by mouth daily.      folic acid (FOLVITE) 1 MG tablet TAKE 1 TABLET(1 MG) BY MOUTH DAILY 90 tablet 3    metFORMIN (GLUCOPHAGE) 1000 MG tablet Take 500 mg by mouth daily.       metoprolol succinate (TOPROL-XL) 50 MG 24 hr tablet TAKE 1 TABLET(50 MG) BY MOUTH DAILY 90 tablet 2    Multiple Vitamin (MULTIVITAMIN WITH MINERALS) TABS tablet Take 1 tablet by mouth every other day.       Na Sulfate-K Sulfate-Mg Sulf (SUPREP BOWEL PREP KIT) 17.5-3.13-1.6 GM/177ML SOLN Take 1 kit by mouth as directed. 324 mL 0    thiamine 100 MG tablet Take 1 tablet (100 mg total) by mouth daily. (Patient not taking: Reported on 07/14/2022) 30 tablet 6    traMADol (ULTRAM) 50 MG tablet Take 1 tablet (50 mg total) by mouth every 6 (six) hours as needed for moderate pain. (Patient not taking: Reported on 07/14/2022) 30 tablet 0    triamcinolone cream (KENALOG) 0.5 % as directed. (Patient not taking: Reported on 07/14/2022)      No current facility-administered medications for this encounter.   ROS  Review of Systems  Gastrointestinal:        RLQ ileostomy Abdominal hernia  Skin:  Positive for color change, rash and wound.       Breakdown around stoma and midline abdominal wound   Psychiatric/Behavioral: Negative.    All other systems reviewed and are negative.  Vital signs:  BP (!) 153/92   Pulse 95   Temp (!) 97.5 F (36.4 C) (Oral)   Resp 20   Ht 6' 1"$  (1.854 m)   Wt 111.1 kg   SpO2 95%   BMI 32.32 kg/m  Exam:  Physical Exam Vitals reviewed.  Constitutional:      Appearance: Normal appearance.  Abdominal:     Hernia: A hernia is present.     Comments: Midline nonhealing wound  Neurological:     Mental Status: He is alert.     Stoma type/location:  RLQ ileostomy Stomal assessment/size:  1" recessed Peristomal assessment:  contact dermatitis with peristomal breakdown Treatment options for stomal/peristomal skin: barrier ring skin TAC adhesive 1 piece pouch Output: soft tan stool Ostomy pouching: 1pc. Flat with barrier ring  Education provided:  wound care is provided.  Has fissure in abdominal pannus near right inguinal fold.  Given barrier cream and instructed to keep clean and dry.     Impression/dx  Contact dermatitis Ileostomy Nonhealing surgical wound Discussion  See back next week Plan  Plans for colonoscopy with [facility]    Visit time: 45 minutes.  Domenic Moras FNP-BC

## 2022-07-22 NOTE — Discharge Instructions (Signed)
Follow up with [facility]

## 2022-07-26 ENCOUNTER — Other Ambulatory Visit (HOSPITAL_COMMUNITY): Payer: Self-pay | Admitting: Nurse Practitioner

## 2022-07-26 ENCOUNTER — Encounter (HOSPITAL_COMMUNITY)
Admission: RE | Admit: 2022-07-26 | Discharge: 2022-07-26 | Disposition: A | Discharge status: Home / Self Care | Payer: Medicaid Other | Source: Ambulatory Visit | Attending: Nurse Practitioner | Admitting: Nurse Practitioner

## 2022-07-26 DIAGNOSIS — T8189XD Other complications of procedures, not elsewhere classified, subsequent encounter: Secondary | ICD-10-CM | POA: Diagnosis not present

## 2022-07-26 DIAGNOSIS — K4020 Bilateral inguinal hernia, without obstruction or gangrene, not specified as recurrent: Secondary | ICD-10-CM

## 2022-07-26 DIAGNOSIS — Z432 Encounter for attention to ileostomy: Secondary | ICD-10-CM

## 2022-07-26 DIAGNOSIS — L24B3 Irritant contact dermatitis related to fecal or urinary stoma or fistula: Secondary | ICD-10-CM | POA: Diagnosis not present

## 2022-07-26 DIAGNOSIS — K9413 Enterostomy malfunction: Secondary | ICD-10-CM | POA: Diagnosis not present

## 2022-07-26 DIAGNOSIS — T8189XA Other complications of procedures, not elsewhere classified, initial encounter: Secondary | ICD-10-CM | POA: Diagnosis not present

## 2022-07-26 DIAGNOSIS — Z7689 Persons encountering health services in other specified circumstances: Secondary | ICD-10-CM | POA: Diagnosis not present

## 2022-07-26 NOTE — Progress Notes (Signed)
[facility]   Reason for visit:  RLQ ileostomy Nonhealing surgical wound Abdominal hernia HPI:  Perforated diverticulits Nonhealing surgical wound in the presence of hernia Past Medical History:  Diagnosis Date   Diabetes mellitus without complication ([place])    Diverticulitis    DVT (deep venous thrombosis) ([place])    x3   ETOH abuse    H/O colectomy    Hyperlipidemia    Hypertension    Smoker    Family History  Problem Relation Age of Onset   COPD Mother    Prostate cancer Father    Colon cancer Neg Hx    Allergies  Allergen Reactions   Lisinopril     Other reaction(s): renal effects   Codeine Other (See Comments) and Hives    unknown Other reaction(s): Other (See Comments) unknown unknown   Current Outpatient Medications  Medication Sig Dispense Refill Last Dose   acetaminophen (TYLENOL) 650 MG CR tablet Take 650 mg by mouth every 8 (eight) hours as needed for pain.       amLODipine (NORVASC) 10 MG tablet Take 10 mg by mouth daily.      atorvastatin (LIPITOR) 10 MG tablet Take 10 mg by mouth daily.      folic acid (FOLVITE) 1 MG tablet TAKE 1 TABLET(1 MG) BY MOUTH DAILY 90 tablet 3    metFORMIN (GLUCOPHAGE) 1000 MG tablet Take 500 mg by mouth daily.       metoprolol succinate (TOPROL-XL) 50 MG 24 hr tablet TAKE 1 TABLET(50 MG) BY MOUTH DAILY 90 tablet 2    Multiple Vitamin (MULTIVITAMIN WITH MINERALS) TABS tablet Take 1 tablet by mouth every other day.       Na Sulfate-K Sulfate-Mg Sulf (SUPREP BOWEL PREP KIT) 17.5-3.13-1.6 GM/177ML SOLN Take 1 kit by mouth as directed. 324 mL 0    thiamine 100 MG tablet Take 1 tablet (100 mg total) by mouth daily. (Patient not taking: Reported on 07/14/2022) 30 tablet 6    traMADol (ULTRAM) 50 MG tablet Take 1 tablet (50 mg total) by mouth every 6 (six) hours as needed for moderate pain. (Patient not taking: Reported on 07/14/2022) 30 tablet 0    triamcinolone cream (KENALOG) 0.5 % as directed. (Patient not taking: Reported  on 07/14/2022)      No current facility-administered medications for this encounter.   ROS  Review of Systems  Gastrointestinal:        RLQ ileostomy Abdominal hernia   Psychiatric/Behavioral: Negative.    All other systems reviewed and are negative.  Vital signs:  BP (!) 165/109 (BP Location: Right Arm)   Pulse 97   Temp 98.4 F (36.9 C) (Oral)   Resp 18   SpO2 96%  Exam:  Physical Exam Vitals reviewed.  Constitutional:      Appearance: He is obese.  Abdominal:     Hernia: A hernia is present.  Skin:    General: Skin is warm and dry.     Findings: Rash present.     Comments: Nonhealing wound to midline abdomen  silver hydrofiber twice weekly RLQ fissure in abdominal pannus.  Patient cannot see this area.  I apply calmoseptine and give him samples.  He can feel his way to apply to this area.   Neurological:     Mental Status: He is alert and oriented to person, place, and time.  Psychiatric:        Mood and Affect: Mood normal.        Behavior:  Behavior normal.     Stoma type/location:  1" recessed Stomal assessment/size:  pink moist Peristomal assessment:  denuded skin  Treatment options for stomal/peristomal skin: barrier ring  1piece flat pouch Output: liquid yellow stool Ostomy pouching: 1pc. Education provided:  Aquacel Ag to nonhealing wound cover with ABD pads New pouch to ileostomy    Impression/dx  Contact dermatitis Ileostomy  Nonhealing surgical wound Discussion  Awaiting colonoscopy with  GI Plan  See back in one week    Visit time: 40 minutes.   Domenic Moras FNP-BC

## 2022-07-27 ENCOUNTER — Other Ambulatory Visit (HOSPITAL_COMMUNITY): Payer: Self-pay | Admitting: Nurse Practitioner

## 2022-07-27 ENCOUNTER — Ambulatory Visit (HOSPITAL_COMMUNITY): Payer: Medicaid Other | Attending: Cardiology

## 2022-07-27 DIAGNOSIS — Z950 Presence of cardiac pacemaker: Secondary | ICD-10-CM | POA: Insufficient documentation

## 2022-07-27 DIAGNOSIS — Z432 Encounter for attention to ileostomy: Secondary | ICD-10-CM

## 2022-07-27 DIAGNOSIS — I442 Atrioventricular block, complete: Secondary | ICD-10-CM | POA: Diagnosis not present

## 2022-07-27 DIAGNOSIS — Z7689 Persons encountering health services in other specified circumstances: Secondary | ICD-10-CM | POA: Diagnosis not present

## 2022-07-27 LAB — ECHOCARDIOGRAM COMPLETE
Area-P 1/2: 5.01 cm2
S' Lateral: 3.6 cm

## 2022-07-27 MED ORDER — PERFLUTREN LIPID MICROSPHERE
1.0000 mL | INTRAVENOUS | Status: AC | PRN
  Administered 2022-07-27: 2 mL via INTRAVENOUS

## 2022-08-02 ENCOUNTER — Ambulatory Visit (HOSPITAL_COMMUNITY): Payer: Medicaid Other | Admitting: Nurse Practitioner

## 2022-08-05 ENCOUNTER — Ambulatory Visit (HOSPITAL_COMMUNITY)
Admission: RE | Admit: 2022-08-05 | Discharge: 2022-08-05 | Disposition: A | Discharge status: Home / Self Care | Payer: Medicaid Other | Source: Ambulatory Visit | Attending: *Deleted | Admitting: *Deleted

## 2022-08-05 DIAGNOSIS — Z932 Ileostomy status: Secondary | ICD-10-CM

## 2022-08-05 DIAGNOSIS — K4020 Bilateral inguinal hernia, without obstruction or gangrene, not specified as recurrent: Secondary | ICD-10-CM | POA: Diagnosis not present

## 2022-08-05 DIAGNOSIS — T8189XA Other complications of procedures, not elsewhere classified, initial encounter: Secondary | ICD-10-CM | POA: Diagnosis not present

## 2022-08-05 DIAGNOSIS — T8189XD Other complications of procedures, not elsewhere classified, subsequent encounter: Secondary | ICD-10-CM

## 2022-08-05 DIAGNOSIS — K469 Unspecified abdominal hernia without obstruction or gangrene: Secondary | ICD-10-CM | POA: Diagnosis not present

## 2022-08-05 DIAGNOSIS — Z432 Encounter for attention to ileostomy: Secondary | ICD-10-CM | POA: Insufficient documentation

## 2022-08-05 DIAGNOSIS — L24B3 Irritant contact dermatitis related to fecal or urinary stoma or fistula: Secondary | ICD-10-CM

## 2022-08-05 DIAGNOSIS — X58XXXA Exposure to other specified factors, initial encounter: Secondary | ICD-10-CM | POA: Insufficient documentation

## 2022-08-05 DIAGNOSIS — Z7689 Persons encountering health services in other specified circumstances: Secondary | ICD-10-CM | POA: Diagnosis not present

## 2022-08-05 NOTE — Progress Notes (Signed)
[facility]   Reason for visit:  RLq ileostomy Nonhealing surgical wound Abdominal hernia HPI:  Ileostomy Nonhealing wound hernia Past Medical History:  Diagnosis Date   Diabetes mellitus without complication ([place])    Diverticulitis    DVT (deep venous thrombosis) ([place])    x3   ETOH abuse    H/O colectomy    Hyperlipidemia    Hypertension    Smoker    Family History  Problem Relation Age of Onset   COPD Mother    Prostate cancer Father    Colon cancer Neg Hx    Allergies  Allergen Reactions   Lisinopril     Other reaction(s): renal effects   Codeine Other (See Comments) and Hives    unknown Other reaction(s): Other (See Comments) unknown unknown   Current Outpatient Medications  Medication Sig Dispense Refill Last Dose   acetaminophen (TYLENOL) 650 MG CR tablet Take 650 mg by mouth every 8 (eight) hours as needed for pain.       amLODipine (NORVASC) 10 MG tablet Take 10 mg by mouth daily.      atorvastatin (LIPITOR) 10 MG tablet Take 10 mg by mouth daily.      folic acid (FOLVITE) 1 MG tablet TAKE 1 TABLET(1 MG) BY MOUTH DAILY 90 tablet 3    metFORMIN (GLUCOPHAGE) 1000 MG tablet Take 500 mg by mouth daily.       metoprolol succinate (TOPROL-XL) 50 MG 24 hr tablet TAKE 1 TABLET(50 MG) BY MOUTH DAILY 90 tablet 2    Multiple Vitamin (MULTIVITAMIN WITH MINERALS) TABS tablet Take 1 tablet by mouth every other day.       Na Sulfate-K Sulfate-Mg Sulf (SUPREP BOWEL PREP KIT) 17.5-3.13-1.6 GM/177ML SOLN Take 1 kit by mouth as directed. 324 mL 0    thiamine 100 MG tablet Take 1 tablet (100 mg total) by mouth daily. (Patient not taking: Reported on 07/14/2022) 30 tablet 6    traMADol (ULTRAM) 50 MG tablet Take 1 tablet (50 mg total) by mouth every 6 (six) hours as needed for moderate pain. (Patient not taking: Reported on 07/14/2022) 30 tablet 0    triamcinolone cream (KENALOG) 0.5 % as directed. (Patient not taking: Reported on 07/14/2022)      No current  facility-administered medications for this encounter.   ROS  Review of Systems  Gastrointestinal:        RLq ileostomy Abdominal hernia  Musculoskeletal:  Positive for gait problem.       Weakness  Psychiatric/Behavioral: Negative.    All other systems reviewed and are negative.  Vital signs:  BP (!) 145/103 (BP Location: Right Arm)   Pulse 94   Temp 98 F (36.7 C) (Oral)   Resp 18   SpO2 96%  Exam:  Physical Exam Vitals reviewed.  Constitutional:      Appearance: He is obese.  Abdominal:     Hernia: A hernia is present.     Comments: hernia  Skin:    General: Skin is warm and dry.     Findings: Erythema, lesion and rash present.  Neurological:     Mental Status: He is alert.     Stoma type/location:  RLQ ileostomy Stomal assessment/size:  recessed  1 1/4"  Peristomal assessment:  denuded, parastomal hernia Wound care to abdomen performed.  Aquacel to open areas, top with gauze and ABD pads/tape  secured with ace wraps to support hernia.  Treatment options for stomal/peristomal skin: barrier ring  1 piece pouch  skin tac adhesive Output: soft brown stool Ostomy pouching: 1pc. With barrier ring  Education provided:  continue same     Impression/dx  Contact dermatitis Nonhealing wound Discussion  See back next week Plan  Continue ostomy and wound care [facility] has approved wound care supplies and are mailing more.    Visit time: 60 minutes.   Domenic Moras FNP-BC

## 2022-08-08 DIAGNOSIS — K4020 Bilateral inguinal hernia, without obstruction or gangrene, not specified as recurrent: Secondary | ICD-10-CM | POA: Insufficient documentation

## 2022-08-08 DIAGNOSIS — Z932 Ileostomy status: Secondary | ICD-10-CM | POA: Insufficient documentation

## 2022-08-08 NOTE — Discharge Instructions (Signed)
Continue same ostomy and wound care

## 2022-08-09 ENCOUNTER — Ambulatory Visit (HOSPITAL_COMMUNITY)
Admission: RE | Admit: 2022-08-09 | Discharge: 2022-08-09 | Disposition: A | Discharge status: Home / Self Care | Payer: Medicaid Other | Source: Ambulatory Visit | Attending: *Deleted | Admitting: *Deleted

## 2022-08-09 DIAGNOSIS — L24B3 Irritant contact dermatitis related to fecal or urinary stoma or fistula: Secondary | ICD-10-CM | POA: Diagnosis not present

## 2022-08-09 DIAGNOSIS — K4020 Bilateral inguinal hernia, without obstruction or gangrene, not specified as recurrent: Secondary | ICD-10-CM

## 2022-08-09 DIAGNOSIS — Z432 Encounter for attention to ileostomy: Secondary | ICD-10-CM | POA: Insufficient documentation

## 2022-08-09 DIAGNOSIS — L259 Unspecified contact dermatitis, unspecified cause: Secondary | ICD-10-CM | POA: Insufficient documentation

## 2022-08-09 DIAGNOSIS — Z7689 Persons encountering health services in other specified circumstances: Secondary | ICD-10-CM | POA: Diagnosis not present

## 2022-08-09 NOTE — Progress Notes (Signed)
[facility]   Reason for visit:  RLQ ileostomy Nonhealing surgical wound Abdominal hernia  HPI:  Perforated diverticulum Past Medical History:  Diagnosis Date   Diabetes mellitus without complication ([place])    Diverticulitis    DVT (deep venous thrombosis) ([place])    x3   ETOH abuse    H/O colectomy    Hyperlipidemia    Hypertension    Smoker    Family History  Problem Relation Age of Onset   COPD Mother    Prostate cancer Father    Colon cancer Neg Hx    Allergies  Allergen Reactions   Lisinopril     Other reaction(s): renal effects   Codeine Other (See Comments) and Hives    unknown Other reaction(s): Other (See Comments) unknown unknown   Current Outpatient Medications  Medication Sig Dispense Refill Last Dose   acetaminophen (TYLENOL) 650 MG CR tablet Take 650 mg by mouth every 8 (eight) hours as needed for pain.       amLODipine (NORVASC) 10 MG tablet Take 10 mg by mouth daily.      atorvastatin (LIPITOR) 10 MG tablet Take 10 mg by mouth daily.      folic acid (FOLVITE) 1 MG tablet TAKE 1 TABLET(1 MG) BY MOUTH DAILY 90 tablet 3    metFORMIN (GLUCOPHAGE) 1000 MG tablet Take 500 mg by mouth daily.       metoprolol succinate (TOPROL-XL) 50 MG 24 hr tablet TAKE 1 TABLET(50 MG) BY MOUTH DAILY 90 tablet 2    Multiple Vitamin (MULTIVITAMIN WITH MINERALS) TABS tablet Take 1 tablet by mouth every other day.       Na Sulfate-K Sulfate-Mg Sulf (SUPREP BOWEL PREP KIT) 17.5-3.13-1.6 GM/177ML SOLN Take 1 kit by mouth as directed. 324 mL 0    thiamine 100 MG tablet Take 1 tablet (100 mg total) by mouth daily. (Patient not taking: Reported on 07/14/2022) 30 tablet 6    traMADol (ULTRAM) 50 MG tablet Take 1 tablet (50 mg total) by mouth every 6 (six) hours as needed for moderate pain. (Patient not taking: Reported on 07/14/2022) 30 tablet 0    triamcinolone cream (KENALOG) 0.5 % as directed. (Patient not taking: Reported on 07/14/2022)      No current facility-administered  medications for this encounter.   ROS  Review of Systems  Cardiovascular:        Pacemaker  Gastrointestinal:  Positive for abdominal distention.       Hernia RLQ ileostomy   Skin:  Positive for rash and wound.       Contact dermatitis Abdominal surgical wound, chronic nonhealing, has improved some  Psychiatric/Behavioral:  Positive for agitation.   All other systems reviewed and are negative.  Vital signs:  BP (!) 155/97 (BP Location: Right Arm)   Pulse 92   Temp 97.6 F (36.4 C) (Oral)   Resp 18  Exam:  Physical Exam Vitals reviewed.  Constitutional:      Appearance: He is obese.  Abdominal:     Hernia: A hernia is present.  Skin:    General: Skin is warm and dry.     Findings: Erythema, lesion and rash present.  Neurological:     Mental Status: He is alert and oriented to person, place, and time.  Psychiatric:        Mood and Affect: Mood normal.        Behavior: Behavior normal.     Stoma type/location:  RLq ileostomy Stomal assessment/size:  1"  recessed Peristomal assessment:  intact, erythema creasing at 4 o'clock Treatment options for stomal/peristomal skin: barrier ring 1 piece flat pouch Output: soft brown stool Ostomy pouching: 1pc.flat Education provided:  [facility].  Effective 2//24/24    Impression/dx  Contact dermatitis ileostomy Discussion  Continue wound care Follow up for colonoscopy for [place]  See back one week    Visit time: 40 minutes.   Domenic Moras FNP-BC

## 2022-08-12 DIAGNOSIS — Z419 Encounter for procedure for purposes other than remedying health state, unspecified: Secondary | ICD-10-CM | POA: Diagnosis not present

## 2022-08-15 DIAGNOSIS — K469 Unspecified abdominal hernia without obstruction or gangrene: Secondary | ICD-10-CM | POA: Diagnosis not present

## 2022-08-15 DIAGNOSIS — S31109A Unspecified open wound of abdominal wall, unspecified quadrant without penetration into peritoneal cavity, initial encounter: Secondary | ICD-10-CM | POA: Diagnosis not present

## 2022-08-15 DIAGNOSIS — Z932 Ileostomy status: Secondary | ICD-10-CM | POA: Diagnosis not present

## 2022-08-16 ENCOUNTER — Ambulatory Visit (HOSPITAL_COMMUNITY)
Admission: RE | Admit: 2022-08-16 | Discharge: 2022-08-16 | Disposition: A | Discharge status: Home / Self Care | Payer: Medicaid Other | Source: Ambulatory Visit | Attending: Nurse Practitioner | Admitting: Nurse Practitioner

## 2022-08-16 DIAGNOSIS — L24B3 Irritant contact dermatitis related to fecal or urinary stoma or fistula: Secondary | ICD-10-CM | POA: Diagnosis not present

## 2022-08-16 DIAGNOSIS — K9419 Other complications of enterostomy: Secondary | ICD-10-CM

## 2022-08-16 DIAGNOSIS — L259 Unspecified contact dermatitis, unspecified cause: Secondary | ICD-10-CM | POA: Diagnosis not present

## 2022-08-16 DIAGNOSIS — Z432 Encounter for attention to ileostomy: Secondary | ICD-10-CM | POA: Insufficient documentation

## 2022-08-16 DIAGNOSIS — Z7689 Persons encountering health services in other specified circumstances: Secondary | ICD-10-CM | POA: Diagnosis not present

## 2022-08-16 NOTE — Progress Notes (Signed)
[facility]   Reason for visit:  RLQ ileostomy Midline abdominal wound HPI:  Diverticulitis Past Medical History:  Diagnosis Date   Diabetes mellitus without complication ([place])    Diverticulitis    DVT (deep venous thrombosis) ([place])    x3   ETOH abuse    H/O colectomy    Hyperlipidemia    Hypertension    Smoker    Family History  Problem Relation Age of Onset   COPD Mother    Prostate cancer Father    Colon cancer Neg Hx    Allergies  Allergen Reactions   Lisinopril     Other reaction(s): renal effects   Codeine Other (See Comments) and Hives    unknown Other reaction(s): Other (See Comments) unknown unknown   Current Outpatient Medications  Medication Sig Dispense Refill Last Dose   acetaminophen (TYLENOL) 650 MG CR tablet Take 650 mg by mouth every 8 (eight) hours as needed for pain.       amLODipine (NORVASC) 10 MG tablet Take 10 mg by mouth daily.      atorvastatin (LIPITOR) 10 MG tablet Take 10 mg by mouth daily.      folic acid (FOLVITE) 1 MG tablet TAKE 1 TABLET(1 MG) BY MOUTH DAILY 90 tablet 3    metFORMIN (GLUCOPHAGE) 1000 MG tablet Take 500 mg by mouth daily.       metoprolol succinate (TOPROL-XL) 50 MG 24 hr tablet TAKE 1 TABLET(50 MG) BY MOUTH DAILY 90 tablet 2    Multiple Vitamin (MULTIVITAMIN WITH MINERALS) TABS tablet Take 1 tablet by mouth every other day.       Na Sulfate-K Sulfate-Mg Sulf (SUPREP BOWEL PREP KIT) 17.5-3.13-1.6 GM/177ML SOLN Take 1 kit by mouth as directed. 324 mL 0    thiamine 100 MG tablet Take 1 tablet (100 mg total) by mouth daily. (Patient not taking: Reported on 07/14/2022) 30 tablet 6    traMADol (ULTRAM) 50 MG tablet Take 1 tablet (50 mg total) by mouth every 6 (six) hours as needed for moderate pain. (Patient not taking: Reported on 07/14/2022) 30 tablet 0    triamcinolone cream (KENALOG) 0.5 % as directed. (Patient not taking: Reported on 07/14/2022)      No current facility-administered medications for this  encounter.   ROS  Review of Systems  Cardiovascular:        Packemaker  Skin:  Positive for rash and wound.  All other systems reviewed and are negative.  Vital signs:  BP (!) 148/99   Temp 97.9 F (36.6 C) (Oral)   Resp (!) 189   Ht '6\' 1"'$  (1.854 m)   Wt 107.5 kg   BMI 31.27 kg/m  Exam:  Physical Exam Vitals reviewed.  Constitutional:      Appearance: He is obese.  Abdominal:     Hernia: A hernia is present.     Comments: Midline nonhealing wound  Neurological:     Mental Status: He is alert.     Stoma type/location:  RLQ ileostomy Stomal assessment/size:  1"  flush Peristomal assessment:  erythema Treatment options for stomal/peristomal skin: barrier  ring and 1 piece flat pouch with skin tac adhesive Output: soft brown stool Ostomy pouching: 1pc. Pouch with ring Education provided:  awaiting consult for ostomy reversal and eventual hernia repair.     Impression/dx  Contact dermatitis Nonhealing wound ileostomy Discussion  See back in one week Plan  Supplies given as needed    Visit time: 45 minutes.  Domenic Moras FNP-BC

## 2022-08-18 DIAGNOSIS — K9419 Other complications of enterostomy: Secondary | ICD-10-CM | POA: Insufficient documentation

## 2022-08-18 NOTE — Discharge Instructions (Signed)
See back next week Continue working on supplies

## 2022-08-22 ENCOUNTER — Ambulatory Visit (INDEPENDENT_AMBULATORY_CARE_PROVIDER_SITE_OTHER): Payer: Medicaid Other | Admitting: Primary Care

## 2022-08-22 DIAGNOSIS — Z7689 Persons encountering health services in other specified circumstances: Secondary | ICD-10-CM | POA: Diagnosis not present

## 2022-08-22 NOTE — Progress Notes (Signed)
Remote pacemaker transmission.   

## 2022-08-23 ENCOUNTER — Ambulatory Visit (HOSPITAL_COMMUNITY)
Admission: RE | Admit: 2022-08-23 | Discharge: 2022-08-23 | Disposition: A | Discharge status: Home / Self Care | Payer: Medicaid Other | Source: Ambulatory Visit | Attending: Nurse Practitioner | Admitting: Nurse Practitioner

## 2022-08-23 DIAGNOSIS — Z7984 Long term (current) use of oral hypoglycemic drugs: Secondary | ICD-10-CM | POA: Insufficient documentation

## 2022-08-23 DIAGNOSIS — K4020 Bilateral inguinal hernia, without obstruction or gangrene, not specified as recurrent: Secondary | ICD-10-CM | POA: Diagnosis not present

## 2022-08-23 DIAGNOSIS — K9419 Other complications of enterostomy: Secondary | ICD-10-CM | POA: Diagnosis not present

## 2022-08-23 DIAGNOSIS — L24B3 Irritant contact dermatitis related to fecal or urinary stoma or fistula: Secondary | ICD-10-CM | POA: Insufficient documentation

## 2022-08-23 DIAGNOSIS — K5780 Diverticulitis of intestine, part unspecified, with perforation and abscess without bleeding: Secondary | ICD-10-CM | POA: Diagnosis not present

## 2022-08-23 DIAGNOSIS — I10 Essential (primary) hypertension: Secondary | ICD-10-CM | POA: Insufficient documentation

## 2022-08-23 DIAGNOSIS — Y828 Other medical devices associated with adverse incidents: Secondary | ICD-10-CM | POA: Diagnosis not present

## 2022-08-23 DIAGNOSIS — E785 Hyperlipidemia, unspecified: Secondary | ICD-10-CM | POA: Insufficient documentation

## 2022-08-23 DIAGNOSIS — Z932 Ileostomy status: Secondary | ICD-10-CM | POA: Diagnosis not present

## 2022-08-23 DIAGNOSIS — K469 Unspecified abdominal hernia without obstruction or gangrene: Secondary | ICD-10-CM | POA: Insufficient documentation

## 2022-08-23 DIAGNOSIS — T8189XA Other complications of procedures, not elsewhere classified, initial encounter: Secondary | ICD-10-CM | POA: Insufficient documentation

## 2022-08-23 DIAGNOSIS — E119 Type 2 diabetes mellitus without complications: Secondary | ICD-10-CM | POA: Diagnosis not present

## 2022-08-23 DIAGNOSIS — Z86718 Personal history of other venous thrombosis and embolism: Secondary | ICD-10-CM | POA: Diagnosis not present

## 2022-08-23 DIAGNOSIS — Z9049 Acquired absence of other specified parts of digestive tract: Secondary | ICD-10-CM | POA: Diagnosis not present

## 2022-08-23 DIAGNOSIS — Z79899 Other long term (current) drug therapy: Secondary | ICD-10-CM | POA: Diagnosis not present

## 2022-08-23 DIAGNOSIS — T148XXA Other injury of unspecified body region, initial encounter: Secondary | ICD-10-CM

## 2022-08-23 DIAGNOSIS — Z7689 Persons encountering health services in other specified circumstances: Secondary | ICD-10-CM | POA: Diagnosis not present

## 2022-08-23 NOTE — Progress Notes (Signed)
[facility]   Reason for visit:  RLQ ileostomy Midline nonhealing wound- supplies have been delivered HPI:  Perforated diverticulitis Past Medical History:  Diagnosis Date   Diabetes mellitus without complication ([place])    Diverticulitis    DVT (deep venous thrombosis) ([place])    x3   ETOH abuse    H/O colectomy    Hyperlipidemia    Hypertension    Smoker    Family History  Problem Relation Age of Onset   COPD Mother    Prostate cancer Father    Colon cancer Neg Hx    Allergies  Allergen Reactions   Lisinopril     Other reaction(s): renal effects   Codeine Other (See Comments) and Hives    unknown Other reaction(s): Other (See Comments) unknown unknown   Current Outpatient Medications  Medication Sig Dispense Refill Last Dose   acetaminophen (TYLENOL) 650 MG CR tablet Take 650 mg by mouth every 8 (eight) hours as needed for pain.       amLODipine (NORVASC) 10 MG tablet Take 10 mg by mouth daily.      atorvastatin (LIPITOR) 10 MG tablet Take 10 mg by mouth daily.      folic acid (FOLVITE) 1 MG tablet TAKE 1 TABLET(1 MG) BY MOUTH DAILY 90 tablet 3    metFORMIN (GLUCOPHAGE) 1000 MG tablet Take 500 mg by mouth daily.       metoprolol succinate (TOPROL-XL) 50 MG 24 hr tablet TAKE 1 TABLET(50 MG) BY MOUTH DAILY 90 tablet 2    Multiple Vitamin (MULTIVITAMIN WITH MINERALS) TABS tablet Take 1 tablet by mouth every other day.       Na Sulfate-K Sulfate-Mg Sulf (SUPREP BOWEL PREP KIT) 17.5-3.13-1.6 GM/177ML SOLN Take 1 kit by mouth as directed. 324 mL 0    thiamine 100 MG tablet Take 1 tablet (100 mg total) by mouth daily. (Patient not taking: Reported on 07/14/2022) 30 tablet 6    traMADol (ULTRAM) 50 MG tablet Take 1 tablet (50 mg total) by mouth every 6 (six) hours as needed for moderate pain. (Patient not taking: Reported on 07/14/2022) 30 tablet 0    triamcinolone cream (KENALOG) 0.5 % as directed. (Patient not taking: Reported on 07/14/2022)      No current  facility-administered medications for this encounter.   ROS  Review of Systems  Gastrointestinal:        Abdominal hernia RLQ ileostomy  Skin:  Positive for rash and wound.       Midline nonhealing wound Contact dermatitis to abdomen and peristomal skin  All other systems reviewed and are negative.  Vital signs:  BP (!) 155/92 (BP Location: Right Arm)   Pulse 95   Temp 97.9 F (36.6 C) (Oral)   Resp 18   SpO2 94%  Exam:  Physical Exam Vitals reviewed.  Constitutional:      Appearance: Normal appearance.  Abdominal:     Hernia: A hernia is present.  Skin:    General: Skin is dry.     Findings: Erythema and rash present.  Neurological:     Mental Status: He is alert and oriented to person, place, and time.  Psychiatric:        Mood and Affect: Mood normal.        Behavior: Behavior normal.     Stoma type/location:  RLQ ileostomy Stomal assessment/size:  1" flush Peristomal assessment:  intact Treatment options for stomal/peristomal skin: barrier ring skin tac adhesive Output: soft brown stool Ostomy  pouching: 1pc flat with barrier ring  Education provided:  ostomy and wound care performed.  Hernia support placed around abdomen    Impression/dx  Contact dermatitis Hernia Nonhealing wound Discussion  Awaiting colonoscopy next week Plan  See back in 2 weeks    Visit time: 55 minutes.   Domenic Moras FNP-BC

## 2022-08-26 NOTE — Discharge Instructions (Signed)
COntinue aquacel to open wound Hernia support to abdomen

## 2022-08-29 ENCOUNTER — Telehealth: Payer: Self-pay | Admitting: Internal Medicine

## 2022-08-29 NOTE — Telephone Encounter (Signed)
Reached out to Dr. Lorenso Courier for her preferences.  Will call patient when she advises.

## 2022-08-29 NOTE — Telephone Encounter (Signed)
Pt says that he is certain that Dr Lorenso Courier mentioned in his office visit to do an enema but it is not mentioned in his instructions.  Does he need to do one prior to procedure and when would he need to do this?  Dr. Lorenso Courier please advise.

## 2022-08-29 NOTE — Telephone Encounter (Signed)
Called patient and informed him to use an enema before arriving at [facility] for procedure.  Gave instructions on how to do the enema.  Patient verbalized understanding.

## 2022-08-29 NOTE — Telephone Encounter (Signed)
Patient called states he was told to use an enema for his prep however that is not stated on his prep instructions. Please call to advise and confirm.

## 2022-08-30 ENCOUNTER — Telehealth: Payer: Self-pay | Admitting: Internal Medicine

## 2022-08-30 ENCOUNTER — Ambulatory Visit (AMBULATORY_SURGERY_CENTER): Payer: Medicaid Other | Admitting: Internal Medicine

## 2022-08-30 ENCOUNTER — Encounter: Payer: Self-pay | Admitting: Internal Medicine

## 2022-08-30 VITALS — BP 131/81 | HR 97 | Temp 98.0°F | Resp 22 | Ht 73.0 in | Wt 244.0 lb

## 2022-08-30 DIAGNOSIS — K639 Disease of intestine, unspecified: Secondary | ICD-10-CM | POA: Diagnosis not present

## 2022-08-30 DIAGNOSIS — Z932 Ileostomy status: Secondary | ICD-10-CM

## 2022-08-30 DIAGNOSIS — Z8719 Personal history of other diseases of the digestive system: Secondary | ICD-10-CM | POA: Diagnosis not present

## 2022-08-30 DIAGNOSIS — I10 Essential (primary) hypertension: Secondary | ICD-10-CM | POA: Diagnosis not present

## 2022-08-30 DIAGNOSIS — E119 Type 2 diabetes mellitus without complications: Secondary | ICD-10-CM | POA: Diagnosis not present

## 2022-08-30 DIAGNOSIS — K9419 Other complications of enterostomy: Secondary | ICD-10-CM | POA: Diagnosis not present

## 2022-08-30 MED ORDER — SODIUM CHLORIDE 0.9 % IV SOLN: 500.0000 mL | Freq: Once | INTRAVENOUS | Status: DC

## 2022-08-30 NOTE — Progress Notes (Signed)
Sedate, gd SR's, VSS, report to RN 

## 2022-08-30 NOTE — Patient Instructions (Signed)
Biopsies taken today, await pathology results. Recommended following up with Dr. Thermon Leyland to discuss ileostomy reversal  Repeat colonoscopy for surveillance will be determined based off of pathology results    YOU HAD AN ENDOSCOPIC PROCEDURE TODAY AT [place]:   Refer to the procedure report that was given to you for any specific questions about what was found during the examination.  If the procedure report does not answer your questions, please call your gastroenterologist to clarify.  If you requested that your care partner not be given the details of your procedure findings, then the procedure report has been included in a sealed envelope for you to review at your convenience later.  YOU SHOULD EXPECT: Some feelings of bloating in the abdomen. Passage of more gas than usual.  Walking can help get rid of the air that was put into your GI tract during the procedure and reduce the bloating. If you had a lower endoscopy (such as a colonoscopy or flexible sigmoidoscopy) you may notice spotting of blood in your stool or on the toilet paper. If you underwent a bowel prep for your procedure, you may not have a normal bowel movement for a few days.  Please Note:  You might notice some irritation and congestion in your nose or some drainage.  This is from the oxygen used during your procedure.  There is no need for concern and it should clear up in a day or so.  SYMPTOMS TO REPORT IMMEDIATELY:  Following lower endoscopy (colonoscopy or flexible sigmoidoscopy):  Excessive amounts of blood in the stool  Significant tenderness or worsening of abdominal pains  Swelling of the abdomen that is new, acute  Fever of 100F or higher  For urgent or emergent issues, a gastroenterologist can be reached at any hour by calling [phone]. Do not use MyChart messaging for urgent concerns.    DIET:  We do recommend a small meal at first, but then you may proceed to your regular diet.   Drink plenty of fluids but you should avoid alcoholic beverages for 24 hours.  ACTIVITY:  You should plan to take it easy for the rest of today and you should NOT DRIVE or use heavy machinery until tomorrow (because of the sedation medicines used during the test).    FOLLOW UP: Our staff will call the number listed on your records the next business day following your procedure.  We will call around 7:15- 8:00 am to check on you and address any questions or concerns that you may have regarding the information given to you following your procedure. If we do not reach you, we will leave a message.     If any biopsies were taken you will be contacted by phone or by letter within the next 1-3 weeks.  Please call us at [phone] if you have not heard about the biopsies in 3 weeks.    SIGNATURES/CONFIDENTIALITY: You and/or your care partner have signed paperwork which will be entered into your electronic medical record.  These signatures attest to the fact that that the information above on your After Visit Summary has been reviewed and is understood.  Full responsibility of the confidentiality of this discharge information lies with you and/or your care-partner.

## 2022-08-30 NOTE — Progress Notes (Signed)
GASTROENTEROLOGY PROCEDURE H&P NOTE   Primary Care Physician: Everardo Beals, NP    Reason for Procedure:   History of diverticulitis s/p colectomy with ileostomy  Plan:    Colonoscopy  Patient is appropriate for endoscopic procedure(s) in the ambulatory ([facility]) setting.  The nature of the procedure, as well as the risks, benefits, and alternatives were carefully and thoroughly reviewed with the patient. Ample time for discussion and questions allowed. The patient understood, was satisfied, and agreed to proceed.     HPI: Luis [name] is a 62 y.o. male who presents for colonoscopy for evaluation of history of diverticulitis s/p colectomy with ileostomy .  Patient was most recently seen in the Gastroenterology Clinic on 07/14/22.  No interval change in medical history since that appointment. Please refer to that note for full details regarding GI history and clinical presentation.   Past Medical History:  Diagnosis Date   Diabetes mellitus without complication ([place])    Diverticulitis    DVT (deep venous thrombosis) ([place])    x3   ETOH abuse    H/O colectomy    Hyperlipidemia    Hypertension    Smoker     Past Surgical History:  Procedure Laterality Date   COLOSTOMY     PACEMAKER LEADLESS INSERTION N/A 12/31/2020   Procedure: PACEMAKER LEADLESS INSERTION;  Surgeon: Vickie Epley, MD;  Location: [facility];  Service: Cardiovascular;  Laterality: N/A;   TONSILLECTOMY      Prior to Admission medications   Medication Sig Start Date End Date Taking? Authorizing Provider  acetaminophen (TYLENOL) 650 MG CR tablet Take 650 mg by mouth every 8 (eight) hours as needed for pain.     [provider]  amLODipine (NORVASC) 10 MG tablet Take 10 mg by mouth daily.    [provider]  atorvastatin (LIPITOR) 10 MG tablet Take 10 mg by mouth daily. 11/20/20   [provider]  folic acid (FOLVITE) 1 MG tablet TAKE 1 TABLET(1 MG) BY MOUTH DAILY  01/07/22   Shirley Friar, PA-C  metFORMIN (GLUCOPHAGE) 1000 MG tablet Take 500 mg by mouth daily.     [provider]  metoprolol succinate (TOPROL-XL) 50 MG 24 hr tablet TAKE 1 TABLET(50 MG) BY MOUTH DAILY 02/28/22   Baldwin Jamaica, PA-C  Multiple Vitamin (MULTIVITAMIN WITH MINERALS) TABS tablet Take 1 tablet by mouth every other day.     [provider]  Na Sulfate-K Sulfate-Mg Sulf (SUPREP BOWEL PREP KIT) 17.5-3.13-1.6 GM/177ML SOLN Take 1 kit by mouth as directed. 07/14/22   Sharyn Creamer, MD  thiamine 100 MG tablet Take 1 tablet (100 mg total) by mouth daily. Patient not taking: Reported on 07/14/2022 01/01/21   Shirley Friar, PA-C  traMADol (ULTRAM) 50 MG tablet Take 1 tablet (50 mg total) by mouth every 6 (six) hours as needed for moderate pain. Patient not taking: Reported on 07/14/2022 08/04/19   Shelly Coss, MD  triamcinolone cream (KENALOG) 0.5 % as directed. Patient not taking: Reported on 07/14/2022 08/30/16   [provider]    Current Outpatient Medications  Medication Sig Dispense Refill   acetaminophen (TYLENOL) 650 MG CR tablet Take 650 mg by mouth every 8 (eight) hours as needed for pain.      amLODipine (NORVASC) 10 MG tablet Take 10 mg by mouth daily.     atorvastatin (LIPITOR) 10 MG tablet Take 10 mg by mouth daily.     folic acid (FOLVITE) 1 MG tablet  TAKE 1 TABLET(1 MG) BY MOUTH DAILY 90 tablet 3   metFORMIN (GLUCOPHAGE) 1000 MG tablet Take 500 mg by mouth daily.      metoprolol succinate (TOPROL-XL) 50 MG 24 hr tablet TAKE 1 TABLET(50 MG) BY MOUTH DAILY 90 tablet 2   Multiple Vitamin (MULTIVITAMIN WITH MINERALS) TABS tablet Take 1 tablet by mouth every other day.      Na Sulfate-K Sulfate-Mg Sulf (SUPREP BOWEL PREP KIT) 17.5-3.13-1.6 GM/177ML SOLN Take 1 kit by mouth as directed. 324 mL 0   thiamine 100 MG tablet Take 1 tablet (100 mg total) by mouth daily. (Patient not taking: Reported on 07/14/2022) 30 tablet 6   traMADol  (ULTRAM) 50 MG tablet Take 1 tablet (50 mg total) by mouth every 6 (six) hours as needed for moderate pain. (Patient not taking: Reported on 07/14/2022) 30 tablet 0   triamcinolone cream (KENALOG) 0.5 % as directed. (Patient not taking: Reported on 07/14/2022)     Current Facility-Administered Medications  Medication Dose Route Frequency Provider Last Rate Last Admin   0.9 %  sodium chloride infusion  500 mL Intravenous Once Sharyn Creamer, MD        Allergies as of 08/30/2022 - Review Complete 08/30/2022  Allergen Reaction Noted   Lisinopril  07/13/2021   Codeine Other (See Comments) and Hives 08/02/2019    Family History  Problem Relation Age of Onset   COPD Mother    Prostate cancer Father    Colon cancer Neg Hx     Social History   Socioeconomic History   Marital status: Single    Spouse name: Not on file   Number of children: Not on file   Years of education: Not on file   Highest education level: Not on file  Occupational History   Occupation: Pt states not able to work  Tobacco Use   Smoking status: Every Day    Packs/day: 1.00    Years: 44.00    Additional pack years: 0.00    Total pack years: 44.00    Types: Cigarettes   Smokeless tobacco: Current  Vaping Use   Vaping Use: Never used  Substance and Sexual Activity   Alcohol use: Yes    Comment: 1-24 pt drinks 5 drinks a week   Drug use: Yes    Types: Marijuana   Sexual activity: Not Currently  Other Topics Concern   Not on file  Social History Narrative   Not on file   Social Determinants of Health   Financial Resource Strain: Not on file  Food Insecurity: Not on file  Transportation Needs: Not on file  Physical Activity: Not on file  Stress: Not on file  Social Connections: Not on file  Intimate Partner Violence: Not on file    Physical Exam: Vital signs in last 24 hours: BP 134/86   Pulse 95   Temp 98 F (36.7 C) (Skin)   Ht 6\' 1"  (1.854 m)   Wt 244 lb (110.7 kg)   SpO2 97%   BMI 32.19  kg/m  GEN: NAD EYE: Sclerae anicteric ENT: MMM CV: Non-tachycardic Pulm: No increased WOB GI: Soft NEURO:  Alert & Oriented   Christia Reading, MD [facility]   08/30/2022 2:58 PM

## 2022-08-30 NOTE — Telephone Encounter (Signed)
Pt stated that he tried to insert the enema but was unsuccessful during the insertion portion. Stated that he noticed some bleeding after trying to insert the enema. Spoke with MD and was recommended that patient does not have to do the enema for the procedure. Explained MD recommendations to pt, and he had no further concerns at the end of the call.

## 2022-08-30 NOTE — Progress Notes (Signed)
Pt's states no medical or surgical changes since previsit or office visit. VS assessed by E.C 

## 2022-08-30 NOTE — Progress Notes (Signed)
Called to room to assist during endoscopic procedure.  Patient ID and intended procedure confirmed with present staff. Received instructions for my participation in the procedure from the performing physician.  

## 2022-08-30 NOTE — Op Note (Signed)
[facility] Patient Name: Luis [name] Procedure Date: 08/30/2022 3:01 PM MRN: [id number] Endoscopist: Adline Mango [place] , , [id number] Age: 62 Referring MD:  Date of Birth: [birth year] Gender: Male Account #: [id number] Procedure:                Colonoscopy Indications:              Follow-up of diverticulitis, history of total                            abdominal colectomy with ileostomy. Medicines:                Monitored Anesthesia Care Procedure:                Pre-Anesthesia Assessment:                           - Prior to the procedure, a History and Physical                            was performed, and patient medications and                            allergies were reviewed. The patient's tolerance of                            previous anesthesia was also reviewed. The risks                            and benefits of the procedure and the sedation                            options and risks were discussed with the patient.                            All questions were answered, and informed consent                            was obtained. Prior Anticoagulants: The patient has                            taken no anticoagulant or antiplatelet agents. ASA                            Grade Assessment: III - A patient with severe                            systemic disease. After reviewing the risks and                            benefits, the patient was deemed in satisfactory                            condition to undergo the procedure.  After obtaining informed consent, the colonoscope                            was passed under direct vision. Throughout the                            procedure, the patient's blood pressure, pulse, and                            oxygen saturations were monitored continuously. The                            Olympus CF-HQ190L [id number]) Colonoscope was                            introduced through the  ileostomy and advanced to                            the the terminal ileum. Then the colonoscope was                            inserted through the anus and advanced to the                            rectum. The colonoscopy was performed without                            difficulty. The patient tolerated the procedure                            well. The quality of the bowel preparation was                            adequate. The terminal ileum and the rectum were                            photographed. Scope In: 3:18:33 PM Scope Out: 3:40:13 PM Total Procedure Duration: 0 hours 21 minutes 40 seconds  Findings:                 A localized area of mucosa at the opening of the                            ileostomy was nodular. Biopsies were taken with a                            cold forceps for histology to rule out dysplasia.                            The rest of the examined ileum was normal.                           The rectal stump appeared normal. Stool was seen in  the rectum but this was able to be removed to allow                            for better visualization. Complications:            No immediate complications. Estimated Blood Loss:     Estimated blood loss was minimal. Impression:               - Nodular ileal mucosa. Biopsied.                           - The rectum is normal. Recommendation:           - Discharge patient to home (with escort).                           - Await pathology results.                           - Recommend following up with Dr. Thermon Leyland to                            discuss ileostomy reversal.                           - The findings and recommendations were discussed                            with the patient. Dr Georgian Co "Lyndee Leo" Lorenso Courier,  08/30/2022 3:49:49 PM

## 2022-08-30 NOTE — Telephone Encounter (Signed)
PT is scheduled for colonoscopy today and he is concerned about administering the fleet enema, He has a colostomy bag as well. Please advise.

## 2022-08-31 ENCOUNTER — Telehealth: Payer: Self-pay

## 2022-08-31 DIAGNOSIS — Z932 Ileostomy status: Secondary | ICD-10-CM | POA: Diagnosis not present

## 2022-08-31 DIAGNOSIS — S31109A Unspecified open wound of abdominal wall, unspecified quadrant without penetration into peritoneal cavity, initial encounter: Secondary | ICD-10-CM | POA: Diagnosis not present

## 2022-08-31 NOTE — Telephone Encounter (Signed)
  Follow up Call-     08/30/2022    2:53 PM  Call back number  Post procedure Call Back phone  # [phone]  Permission to leave phone message Yes     Patient questions:  Do you have a fever, pain , or abdominal swelling? No. Pain Score  0 *  Have you tolerated food without any problems? Yes.    Have you been able to return to your normal activities? Yes.    Do you have any questions about your discharge instructions: Diet   No. Medications  No. Follow up visit  No.  Do you have questions or concerns about your Care? No.  Actions: * If pain score is 4 or above: No action needed, pain <4.

## 2022-09-02 ENCOUNTER — Ambulatory Visit: Payer: Medicaid Other | Admitting: Nurse Practitioner

## 2022-09-05 ENCOUNTER — Encounter: Payer: Self-pay | Admitting: Internal Medicine

## 2022-09-05 ENCOUNTER — Ambulatory Visit (HOSPITAL_COMMUNITY)
Admission: RE | Admit: 2022-09-05 | Discharge: 2022-09-05 | Disposition: A | Discharge status: Home / Self Care | Payer: Medicaid Other | Source: Ambulatory Visit | Attending: *Deleted | Admitting: *Deleted

## 2022-09-05 DIAGNOSIS — L24B3 Irritant contact dermatitis related to fecal or urinary stoma or fistula: Secondary | ICD-10-CM

## 2022-09-05 DIAGNOSIS — K9419 Other complications of enterostomy: Secondary | ICD-10-CM | POA: Diagnosis not present

## 2022-09-05 DIAGNOSIS — T148XXA Other injury of unspecified body region, initial encounter: Secondary | ICD-10-CM

## 2022-09-05 DIAGNOSIS — Z432 Encounter for attention to ileostomy: Secondary | ICD-10-CM | POA: Diagnosis not present

## 2022-09-05 DIAGNOSIS — Z7689 Persons encountering health services in other specified circumstances: Secondary | ICD-10-CM | POA: Diagnosis not present

## 2022-09-05 DIAGNOSIS — L258 Unspecified contact dermatitis due to other agents: Secondary | ICD-10-CM | POA: Diagnosis not present

## 2022-09-05 NOTE — Progress Notes (Signed)
[facility]   Reason for visit:  RLQ ileostomy Abdominal hernia with nonhealing surgical wound HPI:  Perforated diverticulum Past Medical History:  Diagnosis Date   Diabetes mellitus without complication ([place])    Diverticulitis    DVT (deep venous thrombosis) ([place])    x3   ETOH abuse    H/O colectomy    Hyperlipidemia    Hypertension    Smoker    Family History  Problem Relation Age of Onset   COPD Mother    Prostate cancer Father    Colon cancer Neg Hx    Rectal cancer Neg Hx    Stomach cancer Neg Hx    Esophageal cancer Neg Hx    Allergies  Allergen Reactions   Lisinopril     Other reaction(s): renal effects   Codeine Other (See Comments) and Hives    unknown Other reaction(s): Other (See Comments) unknown unknown   Current Outpatient Medications  Medication Sig Dispense Refill Last Dose   acetaminophen (TYLENOL) 650 MG CR tablet Take 650 mg by mouth every 8 (eight) hours as needed for pain.       amLODipine (NORVASC) 10 MG tablet Take 10 mg by mouth daily.      atorvastatin (LIPITOR) 10 MG tablet Take 10 mg by mouth daily.      cetirizine (ZYRTEC) 10 MG tablet Take 1 tablet every day by oral route in the evening for 90 days.      folic acid (FOLVITE) 1 MG tablet TAKE 1 TABLET(1 MG) BY MOUTH DAILY 90 tablet 3    metFORMIN (GLUCOPHAGE) 1000 MG tablet Take 500 mg by mouth daily.       metoprolol succinate (TOPROL-XL) 50 MG 24 hr tablet TAKE 1 TABLET(50 MG) BY MOUTH DAILY 90 tablet 2    Multiple Vitamin (MULTIVITAMIN WITH MINERALS) TABS tablet Take 1 tablet by mouth every other day.       thiamine 100 MG tablet Take 1 tablet (100 mg total) by mouth daily. 30 tablet 6    traMADol (ULTRAM) 50 MG tablet Take 1 tablet (50 mg total) by mouth every 6 (six) hours as needed for moderate pain. (Patient not taking: Reported on 07/14/2022) 30 tablet 0    triamcinolone cream (KENALOG) 0.5 % as directed. (Patient not taking: Reported on 07/14/2022)      No current  facility-administered medications for this encounter.   ROS  Review of Systems  Cardiovascular:        Pacemaker  Gastrointestinal:        RLQ ileostomy Abdominal hernia  Skin:  Positive for rash and wound.   Vital signs:  BP (!) 139/90 (BP Location: Right Arm)   Pulse 94   Temp 98.5 F (36.9 C) (Oral)   Resp 18   SpO2 96%  Exam:  Physical Exam Vitals reviewed.  Constitutional:      Appearance: He is obese.  Cardiovascular:     Rate and Rhythm: Normal rate.  Abdominal:     Hernia: A hernia is present.     Comments: RLQ ileostomy Nonhealing surgical wound abdomen  Skin:    General: Skin is warm and dry.  Neurological:     Mental Status: He is alert and oriented to person, place, and time.  Psychiatric:        Mood and Affect: Mood normal.        Behavior: Behavior normal.     Stoma type/location:  RLQ ileostomy, recessed Stomal assessment/size:  1", recessed Peristomal  assessment:  contact dermatitis Treatment options for stomal/peristomal skin: 1 pc pouch with barrier ring  skin tack adhesive  Output: soft brown stool Ostomy pouching: 1pc.   Education provided:  wound care to midline with aquacel, gauze and ABD pads   pressure applied to bleeding areas.  RLQ ileostomy  pouch changed Abdomen wrapped in ace wrap to support hernia    Impression/dx  Contact dermatitis to peristomal skin and periwound skin  medical adhesive related skin injury RLQ ileostomy Nonhealing surgical wound Discussion  See back in a week Plan  Awaiting colonoscopy prior to ostomy reversal    Visit time: 55 minutes.   Domenic Moras FNP-BC

## 2022-09-12 DIAGNOSIS — K469 Unspecified abdominal hernia without obstruction or gangrene: Secondary | ICD-10-CM | POA: Diagnosis not present

## 2022-09-12 DIAGNOSIS — Z419 Encounter for procedure for purposes other than remedying health state, unspecified: Secondary | ICD-10-CM | POA: Diagnosis not present

## 2022-09-13 ENCOUNTER — Ambulatory Visit (HOSPITAL_COMMUNITY)
Admission: RE | Admit: 2022-09-13 | Discharge: 2022-09-13 | Disposition: A | Discharge status: Home / Self Care | Payer: Medicaid Other | Source: Ambulatory Visit | Attending: *Deleted | Admitting: *Deleted

## 2022-09-13 DIAGNOSIS — L24B3 Irritant contact dermatitis related to fecal or urinary stoma or fistula: Secondary | ICD-10-CM | POA: Diagnosis not present

## 2022-09-13 DIAGNOSIS — T148XXA Other injury of unspecified body region, initial encounter: Secondary | ICD-10-CM | POA: Diagnosis not present

## 2022-09-13 DIAGNOSIS — Z932 Ileostomy status: Secondary | ICD-10-CM | POA: Insufficient documentation

## 2022-09-13 DIAGNOSIS — K9413 Enterostomy malfunction: Secondary | ICD-10-CM

## 2022-09-13 DIAGNOSIS — Z7689 Persons encountering health services in other specified circumstances: Secondary | ICD-10-CM | POA: Diagnosis not present

## 2022-09-13 NOTE — Progress Notes (Signed)
[facility]   Reason for visit:  RLQ ileostomy Abdominal hernia Nonhealing surgical wound HPI:  Perforated diverticulum Past Medical History:  Diagnosis Date   Diabetes mellitus without complication ([place])    Diverticulitis    DVT (deep venous thrombosis) ([place])    x3   ETOH abuse    H/O colectomy    Hyperlipidemia    Hypertension    Smoker    Family History  Problem Relation Age of Onset   COPD Mother    Prostate cancer Father    Colon cancer Neg Hx    Rectal cancer Neg Hx    Stomach cancer Neg Hx    Esophageal cancer Neg Hx    Allergies  Allergen Reactions   Lisinopril     Other reaction(s): renal effects   Codeine Other (See Comments) and Hives    unknown Other reaction(s): Other (See Comments) unknown unknown   Current Outpatient Medications  Medication Sig Dispense Refill Last Dose   acetaminophen (TYLENOL) 650 MG CR tablet Take 650 mg by mouth every 8 (eight) hours as needed for pain.       amLODipine (NORVASC) 10 MG tablet Take 10 mg by mouth daily.      atorvastatin (LIPITOR) 10 MG tablet Take 10 mg by mouth daily.      cetirizine (ZYRTEC) 10 MG tablet Take 1 tablet every day by oral route in the evening for 90 days.      folic acid (FOLVITE) 1 MG tablet TAKE 1 TABLET(1 MG) BY MOUTH DAILY 90 tablet 3    metFORMIN (GLUCOPHAGE) 1000 MG tablet Take 500 mg by mouth daily.       metoprolol succinate (TOPROL-XL) 50 MG 24 hr tablet TAKE 1 TABLET(50 MG) BY MOUTH DAILY 90 tablet 2    Multiple Vitamin (MULTIVITAMIN WITH MINERALS) TABS tablet Take 1 tablet by mouth every other day.       thiamine 100 MG tablet Take 1 tablet (100 mg total) by mouth daily. 30 tablet 6    traMADol (ULTRAM) 50 MG tablet Take 1 tablet (50 mg total) by mouth every 6 (six) hours as needed for moderate pain. (Patient not taking: Reported on 07/14/2022) 30 tablet 0    triamcinolone cream (KENALOG) 0.5 % as directed. (Patient not taking: Reported on 07/14/2022)      No current  facility-administered medications for this encounter.   ROS  Review of Systems  Constitutional:  Positive for fatigue.       States not feeling well today  Respiratory:  Positive for cough.        Upper respiratory congestion and fatigue  Cardiovascular:        Pacemaker  Musculoskeletal:  Positive for gait problem.  Skin:  Positive for color change, rash and wound.  All other systems reviewed and are negative.  Vital signs:  BP (!) 158/86   Temp 98.1 F (36.7 C) (Oral)   Resp 18   Ht 6\' 1"  (1.854 m)   Wt 108.9 kg   SpO2 95%   BMI 31.66 kg/m  Exam:  Physical Exam Vitals reviewed.  Constitutional:      Appearance: He is obese.  Pulmonary:     Breath sounds: Normal breath sounds.  Abdominal:     Hernia: A hernia is present.     Comments: Rounded abdomen, nonhealing midline surgical wound  Skin:    Findings: Erythema, lesion and rash present.  Neurological:     Mental Status: He is alert and  oriented to person, place, and time.  Psychiatric:        Mood and Affect: Mood normal.        Behavior: Behavior normal.     Stoma type/location:  RLQ ileostomy Stomal assessment/size:  1" recessed Peristomal assessment:  denuded skin Treatment options for stomal/peristomal skin: barrier ring  skin Tac adhesive Output: soft brown stool Ostomy pouching: 1pc. Education provided:  pouch change, dressing change to midline wound  Continue with Aquacel Ag.      Impression/dx  Contact dermatitis Ileostomy complication Discussion  See back one week  Plan  Has completed colonoscopy  follow up with surgery regarding reversal    Visit time: 40 minutes.   Domenic Moras FNP-BC

## 2022-09-16 DIAGNOSIS — K469 Unspecified abdominal hernia without obstruction or gangrene: Secondary | ICD-10-CM | POA: Diagnosis not present

## 2022-09-20 ENCOUNTER — Ambulatory Visit (HOSPITAL_COMMUNITY)
Admission: RE | Admit: 2022-09-20 | Discharge: 2022-09-20 | Disposition: A | Discharge status: Home / Self Care | Payer: Medicaid Other | Source: Ambulatory Visit | Attending: Nurse Practitioner | Admitting: Nurse Practitioner

## 2022-09-20 DIAGNOSIS — L24B3 Irritant contact dermatitis related to fecal or urinary stoma or fistula: Secondary | ICD-10-CM

## 2022-09-20 DIAGNOSIS — Z932 Ileostomy status: Secondary | ICD-10-CM | POA: Diagnosis present

## 2022-09-20 DIAGNOSIS — K469 Unspecified abdominal hernia without obstruction or gangrene: Secondary | ICD-10-CM | POA: Insufficient documentation

## 2022-09-20 DIAGNOSIS — K4020 Bilateral inguinal hernia, without obstruction or gangrene, not specified as recurrent: Secondary | ICD-10-CM

## 2022-09-20 DIAGNOSIS — K9419 Other complications of enterostomy: Secondary | ICD-10-CM

## 2022-09-20 DIAGNOSIS — T148XXA Other injury of unspecified body region, initial encounter: Secondary | ICD-10-CM

## 2022-09-20 DIAGNOSIS — Y838 Other surgical procedures as the cause of abnormal reaction of the patient, or of later complication, without mention of misadventure at the time of the procedure: Secondary | ICD-10-CM | POA: Insufficient documentation

## 2022-09-20 DIAGNOSIS — Z7689 Persons encountering health services in other specified circumstances: Secondary | ICD-10-CM | POA: Diagnosis not present

## 2022-09-20 NOTE — Progress Notes (Signed)
[facility]   Reason for visit:  RLQ ileostomy Midline abdominal wound, nonhealing Abdominal hernia HPI:  Perforated diverticulum  Past Medical History:  Diagnosis Date   Diabetes mellitus without complication ([facility])    Diverticulitis    DVT (deep venous thrombosis) ([facility])    x3   ETOH abuse    H/O colectomy    Hyperlipidemia    Hypertension    Smoker    Family History  Problem Relation Age of Onset   COPD Mother    Prostate cancer Father    Colon cancer Neg Hx    Rectal cancer Neg Hx    Stomach cancer Neg Hx    Esophageal cancer Neg Hx    Allergies  Allergen Reactions   Lisinopril     Other reaction(s): renal effects   Codeine Other (See Comments) and Hives    unknown Other reaction(s): Other (See Comments) unknown unknown   Current Outpatient Medications  Medication Sig Dispense Refill Last Dose   acetaminophen (TYLENOL) 650 MG CR tablet Take 650 mg by mouth every 8 (eight) hours as needed for pain.       amLODipine (NORVASC) 10 MG tablet Take 10 mg by mouth daily.      atorvastatin (LIPITOR) 10 MG tablet Take 10 mg by mouth daily.      cetirizine (ZYRTEC) 10 MG tablet Take 1 tablet every day by oral route in the evening for 90 days.      folic acid (FOLVITE) 1 MG tablet TAKE 1 TABLET(1 MG) BY MOUTH DAILY 90 tablet 3    metFORMIN (GLUCOPHAGE) 1000 MG tablet Take 500 mg by mouth daily.       metoprolol succinate (TOPROL-XL) 50 MG 24 hr tablet TAKE 1 TABLET(50 MG) BY MOUTH DAILY 90 tablet 2    Multiple Vitamin (MULTIVITAMIN WITH MINERALS) TABS tablet Take 1 tablet by mouth every other day.       thiamine 100 MG tablet Take 1 tablet (100 mg total) by mouth daily. 30 tablet 6    traMADol (ULTRAM) 50 MG tablet Take 1 tablet (50 mg total) by mouth every 6 (six) hours as needed for moderate pain. (Patient not taking: Reported on 07/14/2022) 30 tablet 0    triamcinolone cream (KENALOG) 0.5 % as directed. (Patient not taking: Reported on 07/14/2022)      No  current facility-administered medications for this encounter.   ROS  Review of Systems  Constitutional:  Positive for fatigue.  Cardiovascular:        Pacemaker  Gastrointestinal:        RLQ ileostomy Large abdominal hernia  Skin:  Positive for color change, rash and wound.  All other systems reviewed and are negative.  Vital signs:  BP (!) 161/101 (BP Location: Right Arm)   Pulse 97   Temp 98.2 F (36.8 C) (Oral)   Resp 18   SpO2 95%  Exam:  Physical Exam Vitals reviewed.  Constitutional:      Appearance: He is obese.  Abdominal:     Hernia: A hernia is present.     Comments: Midline wound, nonhealing   Skin:    General: Skin is warm and dry.     Findings: Erythema, lesion and rash present.  Neurological:     Mental Status: He is alert and oriented to person, place, and time.     Stoma type/location:  RLQ ileostomy, recessed Stomal assessment/size:  1"  Peristomal assessment:  chronic dermatitis  pouch was leaking today at  visit  peristomal skin reddened Treatment options for stomal/peristomal skin: barrier ring, skin tack 1 piece flat pouch Output: soft brown stool Ostomy pouching: 1pc. Flat with barrier ring Education provided:  pouch change performed, wound care performed  Abdomen and hernia supported with ace wrap around torso    Impression/dx  Ileostomy Abdominal hernia Nonhealing surgical wound Discussion  Awaiting reversal for ostomy  Hernia surgery will be later with [facility]  Plan  See back weekly    Visit time: 45 minutes.   Maple Hudson FNP-BC

## 2022-09-27 ENCOUNTER — Ambulatory Visit (HOSPITAL_COMMUNITY)
Admission: RE | Admit: 2022-09-27 | Discharge: 2022-09-27 | Disposition: A | Discharge status: Home / Self Care | Payer: Medicaid Other | Source: Ambulatory Visit | Attending: Nurse Practitioner | Admitting: Nurse Practitioner

## 2022-09-27 DIAGNOSIS — Z432 Encounter for attention to ileostomy: Secondary | ICD-10-CM | POA: Insufficient documentation

## 2022-09-27 DIAGNOSIS — L24B3 Irritant contact dermatitis related to fecal or urinary stoma or fistula: Secondary | ICD-10-CM | POA: Insufficient documentation

## 2022-09-27 DIAGNOSIS — K9413 Enterostomy malfunction: Secondary | ICD-10-CM | POA: Diagnosis not present

## 2022-09-27 DIAGNOSIS — K435 Parastomal hernia without obstruction or  gangrene: Secondary | ICD-10-CM | POA: Diagnosis not present

## 2022-09-27 DIAGNOSIS — Z7689 Persons encountering health services in other specified circumstances: Secondary | ICD-10-CM | POA: Diagnosis not present

## 2022-09-27 DIAGNOSIS — Z932 Ileostomy status: Secondary | ICD-10-CM | POA: Diagnosis present

## 2022-09-27 DIAGNOSIS — T148XXA Other injury of unspecified body region, initial encounter: Secondary | ICD-10-CM

## 2022-09-27 NOTE — Progress Notes (Signed)
[facility]   Reason for visit:  RLQ ileostomy, recessed HPI:  Ileostomy Abdominal hernia Nonhealing surgical wound Past Medical History:  Diagnosis Date   Diabetes mellitus without complication (HCC)    Diverticulitis    DVT (deep venous thrombosis) (HCC)    x3   ETOH abuse    H/O colectomy    Hyperlipidemia    Hypertension    Smoker    Family History  Problem Relation Age of Onset   COPD Mother    Prostate cancer Father    Colon cancer Neg Hx    Rectal cancer Neg Hx    Stomach cancer Neg Hx    Esophageal cancer Neg Hx    Allergies  Allergen Reactions   Lisinopril     Other reaction(s): renal effects   Codeine Other (See Comments) and Hives    unknown Other reaction(s): Other (See Comments) unknown unknown   Current Outpatient Medications  Medication Sig Dispense Refill Last Dose   acetaminophen (TYLENOL) 650 MG CR tablet Take 650 mg by mouth every 8 (eight) hours as needed for pain.       amLODipine (NORVASC) 10 MG tablet Take 10 mg by mouth daily.      atorvastatin (LIPITOR) 10 MG tablet Take 10 mg by mouth daily.      cetirizine (ZYRTEC) 10 MG tablet Take 1 tablet every day by oral route in the evening for 90 days.      folic acid (FOLVITE) 1 MG tablet TAKE 1 TABLET(1 MG) BY MOUTH DAILY 90 tablet 3    metFORMIN (GLUCOPHAGE) 1000 MG tablet Take 500 mg by mouth daily.       metoprolol succinate (TOPROL-XL) 50 MG 24 hr tablet TAKE 1 TABLET(50 MG) BY MOUTH DAILY 90 tablet 2    Multiple Vitamin (MULTIVITAMIN WITH MINERALS) TABS tablet Take 1 tablet by mouth every other day.       thiamine 100 MG tablet Take 1 tablet (100 mg total) by mouth daily. 30 tablet 6    traMADol (ULTRAM) 50 MG tablet Take 1 tablet (50 mg total) by mouth every 6 (six) hours as needed for moderate pain. (Patient not taking: Reported on 07/14/2022) 30 tablet 0    triamcinolone cream (KENALOG) 0.5 % as directed. (Patient not taking: Reported on 07/14/2022)      No current  facility-administered medications for this encounter.   ROS  Review of Systems  Constitutional:  Positive for fatigue.  Gastrointestinal:        RLQ ileostomy Abdominal hernia   Musculoskeletal:  Positive for gait problem.       Uses wheelchair for long distances  Skin:  Positive for color change, rash and wound.  All other systems reviewed and are negative.  Vital signs:  BP (!) 143/91 (BP Location: Right Arm)   Pulse 96   Temp 98.5 F (36.9 C) (Oral)   Resp 18   SpO2 94%  Exam:  Physical Exam Vitals reviewed.  Constitutional:      Appearance: He is obese.  Cardiovascular:     Comments: Pacemaker  Pulmonary:     Comments: smoker Abdominal:     Hernia: A hernia is present.     Comments: Midline wound, nonhealing  Neurological:     Mental Status: He is alert and oriented to person, place, and time.  Psychiatric:        Mood and Affect: Mood normal.        Behavior: Behavior normal.  Stoma type/location:  RLQ ileostomy Stomal assessment/size: 1" recessed Peristomal assessment:  denuded skin, large parastomal hernia Treatment options for stomal/peristomal skin: barrier ring and 1 piece flat pouch Output: soft brown stool Ostomy pouching: 1pc. Education provided:  wound care performed, ostomy care performed,  Abdomen wrapped with ace bandage for support.     Impression/dx  Ileostomy Contact dermatitis Discussion  See back as needed.  Plan  Continue wound and ostomy care.     Visit time: 45 minutes.   Maple Hudson FNP-BC

## 2022-10-03 ENCOUNTER — Ambulatory Visit (INDEPENDENT_AMBULATORY_CARE_PROVIDER_SITE_OTHER): Payer: Medicaid Other

## 2022-10-03 ENCOUNTER — Ambulatory Visit (HOSPITAL_COMMUNITY): Payer: Medicaid Other | Admitting: Nurse Practitioner

## 2022-10-03 DIAGNOSIS — I442 Atrioventricular block, complete: Secondary | ICD-10-CM | POA: Diagnosis not present

## 2022-10-04 DIAGNOSIS — K469 Unspecified abdominal hernia without obstruction or gangrene: Secondary | ICD-10-CM | POA: Diagnosis not present

## 2022-10-04 DIAGNOSIS — T8189XA Other complications of procedures, not elsewhere classified, initial encounter: Secondary | ICD-10-CM | POA: Diagnosis not present

## 2022-10-06 LAB — CUP PACEART REMOTE DEVICE CHECK
Battery Remaining Longevity: 91 mo
Battery Voltage: 3.02 V
Brady Statistic AS VP Percent: 97.42 %
Brady Statistic AS VS Percent: 0.02 %
Brady Statistic RV Percent Paced: 99.86 %
Date Time Interrogation Session: 20240425132356
Implantable Pulse Generator Implant Date: 20220721
Lead Channel Impedance Value: 530 Ohm
Lead Channel Pacing Threshold Amplitude: 0.25 V
Lead Channel Pacing Threshold Pulse Width: 0.24 ms
Lead Channel Sensing Intrinsic Amplitude: 21.263 mV
Lead Channel Setting Pacing Amplitude: 0.875
Lead Channel Setting Pacing Pulse Width: 0.24 ms
Lead Channel Setting Sensing Sensitivity: 2 mV

## 2022-10-07 DIAGNOSIS — K469 Unspecified abdominal hernia without obstruction or gangrene: Secondary | ICD-10-CM | POA: Diagnosis not present

## 2022-10-07 DIAGNOSIS — T8189XA Other complications of procedures, not elsewhere classified, initial encounter: Secondary | ICD-10-CM | POA: Diagnosis not present

## 2022-10-11 ENCOUNTER — Ambulatory Visit (HOSPITAL_COMMUNITY): Payer: Medicaid Other | Admitting: Nurse Practitioner

## 2022-10-12 DIAGNOSIS — Z419 Encounter for procedure for purposes other than remedying health state, unspecified: Secondary | ICD-10-CM | POA: Diagnosis not present

## 2022-10-14 ENCOUNTER — Ambulatory Visit (HOSPITAL_COMMUNITY)
Admission: RE | Admit: 2022-10-14 | Discharge: 2022-10-14 | Disposition: A | Discharge status: Home / Self Care | Payer: Medicaid Other | Source: Ambulatory Visit | Attending: *Deleted | Admitting: *Deleted

## 2022-10-14 DIAGNOSIS — Z432 Encounter for attention to ileostomy: Secondary | ICD-10-CM | POA: Diagnosis not present

## 2022-10-14 DIAGNOSIS — Z7689 Persons encountering health services in other specified circumstances: Secondary | ICD-10-CM | POA: Diagnosis not present

## 2022-10-14 DIAGNOSIS — L24B3 Irritant contact dermatitis related to fecal or urinary stoma or fistula: Secondary | ICD-10-CM

## 2022-10-14 DIAGNOSIS — K4020 Bilateral inguinal hernia, without obstruction or gangrene, not specified as recurrent: Secondary | ICD-10-CM | POA: Diagnosis not present

## 2022-10-14 DIAGNOSIS — K469 Unspecified abdominal hernia without obstruction or gangrene: Secondary | ICD-10-CM | POA: Diagnosis not present

## 2022-10-14 DIAGNOSIS — T148XXA Other injury of unspecified body region, initial encounter: Secondary | ICD-10-CM | POA: Diagnosis not present

## 2022-10-14 NOTE — Progress Notes (Signed)
[facility]   Reason for visit:  RLQ ileostomy, recessed Nonhealing abdominal wound Large abdominal hernia  HPI:  Perforated diverticulum with ileostomy Past Medical History:  Diagnosis Date   Diabetes mellitus without complication (HCC)    Diverticulitis    DVT (deep venous thrombosis) (HCC)    x3   ETOH abuse    H/O colectomy    Hyperlipidemia    Hypertension    Smoker    Family History  Problem Relation Age of Onset   COPD Mother    Prostate cancer Father    Colon cancer Neg Hx    Rectal cancer Neg Hx    Stomach cancer Neg Hx    Esophageal cancer Neg Hx    Allergies  Allergen Reactions   Lisinopril     Other reaction(s): renal effects   Codeine Other (See Comments) and Hives    unknown Other reaction(s): Other (See Comments) unknown unknown   Current Outpatient Medications  Medication Sig Dispense Refill Last Dose   acetaminophen (TYLENOL) 650 MG CR tablet Take 650 mg by mouth every 8 (eight) hours as needed for pain.       amLODipine (NORVASC) 10 MG tablet Take 10 mg by mouth daily.      atorvastatin (LIPITOR) 10 MG tablet Take 10 mg by mouth daily.      cetirizine (ZYRTEC) 10 MG tablet Take 1 tablet every day by oral route in the evening for 90 days.      folic acid (FOLVITE) 1 MG tablet TAKE 1 TABLET(1 MG) BY MOUTH DAILY 90 tablet 3    metFORMIN (GLUCOPHAGE) 1000 MG tablet Take 500 mg by mouth daily.       metoprolol succinate (TOPROL-XL) 50 MG 24 hr tablet TAKE 1 TABLET(50 MG) BY MOUTH DAILY 90 tablet 2    Multiple Vitamin (MULTIVITAMIN WITH MINERALS) TABS tablet Take 1 tablet by mouth every other day.       thiamine 100 MG tablet Take 1 tablet (100 mg total) by mouth daily. 30 tablet 6    traMADol (ULTRAM) 50 MG tablet Take 1 tablet (50 mg total) by mouth every 6 (six) hours as needed for moderate pain. (Patient not taking: Reported on 07/14/2022) 30 tablet 0    triamcinolone cream (KENALOG) 0.5 % as directed. (Patient not taking: Reported on  07/14/2022)      No current facility-administered medications for this encounter.   ROS  Review of Systems Vital signs:  There were no vitals taken for this visit. Exam:  Physical Exam  Stoma type/location:  *** Stomal assessment/size:  *** Peristomal assessment:  *** Treatment options for stomal/peristomal skin: *** Output: *** Ostomy pouching: 1pc./2pc.  Education provided:  ***    Impression/dx  *** Discussion  *** Plan  ***    Visit time: *** minutes.   Maple Hudson FNP-BC

## 2022-10-17 ENCOUNTER — Ambulatory Visit (HOSPITAL_COMMUNITY)
Admission: RE | Admit: 2022-10-17 | Discharge: 2022-10-17 | Disposition: A | Discharge status: Home / Self Care | Payer: Medicaid Other | Source: Ambulatory Visit | Attending: *Deleted | Admitting: *Deleted

## 2022-10-17 DIAGNOSIS — Z432 Encounter for attention to ileostomy: Secondary | ICD-10-CM | POA: Insufficient documentation

## 2022-10-17 DIAGNOSIS — T148XXA Other injury of unspecified body region, initial encounter: Secondary | ICD-10-CM | POA: Diagnosis not present

## 2022-10-17 DIAGNOSIS — K4020 Bilateral inguinal hernia, without obstruction or gangrene, not specified as recurrent: Secondary | ICD-10-CM | POA: Diagnosis not present

## 2022-10-17 DIAGNOSIS — K9413 Enterostomy malfunction: Secondary | ICD-10-CM | POA: Diagnosis not present

## 2022-10-17 DIAGNOSIS — Z7689 Persons encountering health services in other specified circumstances: Secondary | ICD-10-CM | POA: Diagnosis not present

## 2022-10-17 DIAGNOSIS — K469 Unspecified abdominal hernia without obstruction or gangrene: Secondary | ICD-10-CM | POA: Insufficient documentation

## 2022-10-17 DIAGNOSIS — L24B3 Irritant contact dermatitis related to fecal or urinary stoma or fistula: Secondary | ICD-10-CM

## 2022-10-17 NOTE — Progress Notes (Signed)
[facility]   Reason for visit:  RLQ ileostomy Midline abdominal wound, nonhealing Abdominal hernia HPI:  Perforated diverticulum Past Medical History:  Diagnosis Date   Diabetes mellitus without complication (HCC)    Diverticulitis    DVT (deep venous thrombosis) (HCC)    x3   ETOH abuse    H/O colectomy    Hyperlipidemia    Hypertension    Smoker    Family History  Problem Relation Age of Onset   COPD Mother    Prostate cancer Father    Colon cancer Neg Hx    Rectal cancer Neg Hx    Stomach cancer Neg Hx    Esophageal cancer Neg Hx    Allergies  Allergen Reactions   Lisinopril     Other reaction(s): renal effects   Codeine Other (See Comments) and Hives    unknown Other reaction(s): Other (See Comments) unknown unknown   Current Outpatient Medications  Medication Sig Dispense Refill Last Dose   acetaminophen (TYLENOL) 650 MG CR tablet Take 650 mg by mouth every 8 (eight) hours as needed for pain.       amLODipine (NORVASC) 10 MG tablet Take 10 mg by mouth daily.      atorvastatin (LIPITOR) 10 MG tablet Take 10 mg by mouth daily.      cetirizine (ZYRTEC) 10 MG tablet Take 1 tablet every day by oral route in the evening for 90 days.      folic acid (FOLVITE) 1 MG tablet TAKE 1 TABLET(1 MG) BY MOUTH DAILY 90 tablet 3    metFORMIN (GLUCOPHAGE) 1000 MG tablet Take 500 mg by mouth daily.       metoprolol succinate (TOPROL-XL) 50 MG 24 hr tablet TAKE 1 TABLET(50 MG) BY MOUTH DAILY 90 tablet 2    Multiple Vitamin (MULTIVITAMIN WITH MINERALS) TABS tablet Take 1 tablet by mouth every other day.       thiamine 100 MG tablet Take 1 tablet (100 mg total) by mouth daily. 30 tablet 6    traMADol (ULTRAM) 50 MG tablet Take 1 tablet (50 mg total) by mouth every 6 (six) hours as needed for moderate pain. (Patient not taking: Reported on 07/14/2022) 30 tablet 0    triamcinolone cream (KENALOG) 0.5 % as directed. (Patient not taking: Reported on 07/14/2022)      No  current facility-administered medications for this encounter.   ROS  Review of Systems  Constitutional:  Positive for fatigue.  Cardiovascular:        Pacemaker  Gastrointestinal:        Hernia RLQ recessed ileostomy  Musculoskeletal:  Positive for gait problem.  Skin:  Positive for color change, rash and wound.       Peristomal breakdown Periwound breakdown from chronic tape use  All other systems reviewed and are negative.  Vital signs:  BP (!) 177/95 (BP Location: Right Arm)   Pulse 99   Temp 98.5 F (36.9 C) (Oral)   Resp 18   SpO2 96%  Exam:  Physical Exam Vitals (BP rechecked at conclusion of visit, normal) reviewed.  Constitutional:      Appearance: He is obese.  Cardiovascular:     Rate and Rhythm: Normal rate and regular rhythm.     Comments: Elevated BP after walking from parking lot.  Checked at conclusion of visit, normal for patient   140/84 Pulmonary:     Effort: Pulmonary effort is normal.     Breath sounds: Normal breath sounds.  Comments: States he has chronic cough.  Encouraged to follow up with cardiology Abdominal:     Hernia: A hernia is present.     Comments: Nonhealing wound  Musculoskeletal:     Comments: Uses wheelchair once inside hospital  Skin:    General: Skin is warm and dry.     Findings: Erythema, lesion and rash present.  Neurological:     Mental Status: He is alert and oriented to person, place, and time.  Psychiatric:        Mood and Affect: Mood normal.        Behavior: Behavior normal.     Stoma type/location:  RLQ ileostomy, recessed Stomal assessment/size:  1"  Peristomal assessment:  contact dermatitis, chronic Treatment options for stomal/peristomal skin: skin prep, skin tac barrier ring Output: soft brown stool Ostomy pouching: 1pc. Flat with barrier ring Education provided:  upcoming dental procedure Wound care provided, hernia supported with ace wrap    Impression/dx  Hernia Ileostomy Nonhealing  wound Discussion  See back as needed Plan  Anticipating reversal of ostomy soon.     Visit time: 45 minutes.   Maple Hudson FNP-BC

## 2022-10-19 DIAGNOSIS — S31109A Unspecified open wound of abdominal wall, unspecified quadrant without penetration into peritoneal cavity, initial encounter: Secondary | ICD-10-CM | POA: Diagnosis not present

## 2022-10-19 DIAGNOSIS — Z932 Ileostomy status: Secondary | ICD-10-CM | POA: Diagnosis not present

## 2022-10-25 ENCOUNTER — Ambulatory Visit (HOSPITAL_COMMUNITY)
Admission: RE | Admit: 2022-10-25 | Discharge: 2022-10-25 | Disposition: A | Discharge status: Home / Self Care | Payer: Medicaid Other | Source: Ambulatory Visit | Attending: Nurse Practitioner | Admitting: Nurse Practitioner

## 2022-10-25 DIAGNOSIS — K469 Unspecified abdominal hernia without obstruction or gangrene: Secondary | ICD-10-CM | POA: Insufficient documentation

## 2022-10-25 DIAGNOSIS — T8189XA Other complications of procedures, not elsewhere classified, initial encounter: Secondary | ICD-10-CM | POA: Insufficient documentation

## 2022-10-25 DIAGNOSIS — K9413 Enterostomy malfunction: Secondary | ICD-10-CM

## 2022-10-25 DIAGNOSIS — Z932 Ileostomy status: Secondary | ICD-10-CM | POA: Diagnosis present

## 2022-10-25 DIAGNOSIS — K9419 Other complications of enterostomy: Secondary | ICD-10-CM | POA: Insufficient documentation

## 2022-10-25 DIAGNOSIS — Y838 Other surgical procedures as the cause of abnormal reaction of the patient, or of later complication, without mention of misadventure at the time of the procedure: Secondary | ICD-10-CM | POA: Insufficient documentation

## 2022-10-25 DIAGNOSIS — K4020 Bilateral inguinal hernia, without obstruction or gangrene, not specified as recurrent: Secondary | ICD-10-CM

## 2022-10-25 DIAGNOSIS — K5780 Diverticulitis of intestine, part unspecified, with perforation and abscess without bleeding: Secondary | ICD-10-CM | POA: Diagnosis not present

## 2022-10-25 DIAGNOSIS — T148XXA Other injury of unspecified body region, initial encounter: Secondary | ICD-10-CM | POA: Diagnosis not present

## 2022-10-25 DIAGNOSIS — L24B3 Irritant contact dermatitis related to fecal or urinary stoma or fistula: Secondary | ICD-10-CM | POA: Diagnosis not present

## 2022-10-25 DIAGNOSIS — Z7689 Persons encountering health services in other specified circumstances: Secondary | ICD-10-CM | POA: Diagnosis not present

## 2022-10-25 NOTE — Progress Notes (Signed)
[facility]   Reason for visit:  RLQ ileostomy HPI:  Diverticulitis with perforation and end ileostomy  Large abdominal hernia  nonhealing surgical wound Past Medical History:  Diagnosis Date   Diabetes mellitus without complication (HCC)    Diverticulitis    DVT (deep venous thrombosis) (HCC)    x3   ETOH abuse    H/O colectomy    Hyperlipidemia    Hypertension    Smoker    Family History  Problem Relation Age of Onset   COPD Mother    Prostate cancer Father    Colon cancer Neg Hx    Rectal cancer Neg Hx    Stomach cancer Neg Hx    Esophageal cancer Neg Hx    Allergies  Allergen Reactions   Lisinopril     Other reaction(s): renal effects   Codeine Other (See Comments) and Hives    unknown Other reaction(s): Other (See Comments) unknown unknown   Current Outpatient Medications  Medication Sig Dispense Refill Last Dose   acetaminophen (TYLENOL) 650 MG CR tablet Take 650 mg by mouth every 8 (eight) hours as needed for pain.       amLODipine (NORVASC) 10 MG tablet Take 10 mg by mouth daily.      atorvastatin (LIPITOR) 10 MG tablet Take 10 mg by mouth daily.      cetirizine (ZYRTEC) 10 MG tablet Take 1 tablet every day by oral route in the evening for 90 days.      folic acid (FOLVITE) 1 MG tablet TAKE 1 TABLET(1 MG) BY MOUTH DAILY 90 tablet 3    metFORMIN (GLUCOPHAGE) 1000 MG tablet Take 500 mg by mouth daily.       metoprolol succinate (TOPROL-XL) 50 MG 24 hr tablet TAKE 1 TABLET(50 MG) BY MOUTH DAILY 90 tablet 2    Multiple Vitamin (MULTIVITAMIN WITH MINERALS) TABS tablet Take 1 tablet by mouth every other day.       thiamine 100 MG tablet Take 1 tablet (100 mg total) by mouth daily. 30 tablet 6    traMADol (ULTRAM) 50 MG tablet Take 1 tablet (50 mg total) by mouth every 6 (six) hours as needed for moderate pain. (Patient not taking: Reported on 07/14/2022) 30 tablet 0    triamcinolone cream (KENALOG) 0.5 % as directed. (Patient not taking: Reported on  07/14/2022)      No current facility-administered medications for this encounter.   ROS  Review of Systems  Cardiovascular:        Pacemaker in place  Gastrointestinal:        RLQ ileostomy Abdominal hernia Nonhealing surgical wound  Musculoskeletal:  Positive for gait problem.       Uses wheelchair in hospital  Skin:  Positive for color change, rash and wound. Negative for pallor.  Psychiatric/Behavioral: Negative.    All other systems reviewed and are negative.  Vital signs:  BP (!) 167/98 (BP Location: Right Arm)   Pulse 95   Temp 98.1 F (36.7 C) (Oral)   Resp (!) 22   SpO2 94%  Exam:  Physical Exam Vitals (BP 162/86 at conclusion of visit) reviewed.  Constitutional:      Appearance: He is obese.  Abdominal:     Hernia: A hernia is present.     Comments: RLQ ileostomy Nonhealing surgical wound to abdomen, aquacel to open areas.  Heavy, creamy drainage, musty odor  Skin:    General: Skin is warm and dry.     Findings: Erythema,  lesion and rash present.  Neurological:     Mental Status: He is alert and oriented to person, place, and time.  Psychiatric:        Mood and Affect: Mood normal.        Behavior: Behavior normal.     Stoma type/location:  RLQ ileostomy, flush Stomal assessment/size:  1"  Peristomal assessment:  denuded skin, pink and moist Treatment options for stomal/peristomal skin: barrier ring, 1 piece pouch  uses Skin Tac to adhere pouch  Output: soft yellow stool Ostomy pouching: 1pc.  With barrier ring Education provided:  pouch change performed, wound care performed , abdomen wrapped with ace bandage for support.     Impression/dx  Contact dermatitis  Ileostomy complication Discussion  See back as needed Plan  Will see [facility] to discuss ostomy reversal.  Understands that process to reverse ostomy and repair hernia, if even possible will be a lengthy process.     Visit time: 50 minutes.   Maple Hudson FNP-BC

## 2022-10-26 DIAGNOSIS — F320 Major depressive disorder, single episode, mild: Secondary | ICD-10-CM | POA: Diagnosis not present

## 2022-10-26 DIAGNOSIS — I10 Essential (primary) hypertension: Secondary | ICD-10-CM | POA: Diagnosis not present

## 2022-10-26 DIAGNOSIS — Z932 Ileostomy status: Secondary | ICD-10-CM | POA: Diagnosis not present

## 2022-10-26 DIAGNOSIS — K432 Incisional hernia without obstruction or gangrene: Secondary | ICD-10-CM | POA: Diagnosis not present

## 2022-10-26 DIAGNOSIS — F17210 Nicotine dependence, cigarettes, uncomplicated: Secondary | ICD-10-CM | POA: Diagnosis not present

## 2022-10-26 DIAGNOSIS — E119 Type 2 diabetes mellitus without complications: Secondary | ICD-10-CM | POA: Diagnosis not present

## 2022-10-26 DIAGNOSIS — Z6833 Body mass index (BMI) 33.0-33.9, adult: Secondary | ICD-10-CM | POA: Diagnosis not present

## 2022-10-26 DIAGNOSIS — E669 Obesity, unspecified: Secondary | ICD-10-CM | POA: Diagnosis not present

## 2022-10-26 DIAGNOSIS — Z7689 Persons encountering health services in other specified circumstances: Secondary | ICD-10-CM | POA: Diagnosis not present

## 2022-10-26 DIAGNOSIS — L98429 Non-pressure chronic ulcer of back with unspecified severity: Secondary | ICD-10-CM | POA: Diagnosis not present

## 2022-10-26 DIAGNOSIS — R600 Localized edema: Secondary | ICD-10-CM | POA: Diagnosis not present

## 2022-10-26 DIAGNOSIS — Z87891 Personal history of nicotine dependence: Secondary | ICD-10-CM | POA: Diagnosis not present

## 2022-10-26 DIAGNOSIS — Z950 Presence of cardiac pacemaker: Secondary | ICD-10-CM | POA: Diagnosis not present

## 2022-10-26 DIAGNOSIS — F1011 Alcohol abuse, in remission: Secondary | ICD-10-CM | POA: Diagnosis not present

## 2022-10-26 DIAGNOSIS — E1142 Type 2 diabetes mellitus with diabetic polyneuropathy: Secondary | ICD-10-CM | POA: Diagnosis not present

## 2022-10-26 DIAGNOSIS — N529 Male erectile dysfunction, unspecified: Secondary | ICD-10-CM | POA: Diagnosis not present

## 2022-10-31 NOTE — Discharge Instructions (Signed)
[facility] appointment tomorrow to discuss reversal,  See back one week if needed

## 2022-11-01 ENCOUNTER — Ambulatory Visit (HOSPITAL_COMMUNITY): Payer: Medicaid Other | Admitting: Nurse Practitioner

## 2022-11-01 ENCOUNTER — Ambulatory Visit (HOSPITAL_COMMUNITY)
Admission: RE | Admit: 2022-11-01 | Discharge: 2022-11-01 | Disposition: A | Discharge status: Home / Self Care | Payer: Medicaid Other | Source: Ambulatory Visit | Attending: *Deleted | Admitting: *Deleted

## 2022-11-01 DIAGNOSIS — K9413 Enterostomy malfunction: Secondary | ICD-10-CM

## 2022-11-01 DIAGNOSIS — K469 Unspecified abdominal hernia without obstruction or gangrene: Secondary | ICD-10-CM | POA: Diagnosis not present

## 2022-11-01 DIAGNOSIS — X58XXXA Exposure to other specified factors, initial encounter: Secondary | ICD-10-CM | POA: Diagnosis not present

## 2022-11-01 DIAGNOSIS — K4020 Bilateral inguinal hernia, without obstruction or gangrene, not specified as recurrent: Secondary | ICD-10-CM | POA: Diagnosis not present

## 2022-11-01 DIAGNOSIS — Z432 Encounter for attention to ileostomy: Secondary | ICD-10-CM | POA: Diagnosis not present

## 2022-11-01 DIAGNOSIS — Z87891 Personal history of nicotine dependence: Secondary | ICD-10-CM | POA: Insufficient documentation

## 2022-11-01 DIAGNOSIS — T8189XA Other complications of procedures, not elsewhere classified, initial encounter: Secondary | ICD-10-CM | POA: Diagnosis not present

## 2022-11-01 DIAGNOSIS — L24B1 Irritant contact dermatitis related to digestive stoma or fistula: Secondary | ICD-10-CM

## 2022-11-01 DIAGNOSIS — Z7689 Persons encountering health services in other specified circumstances: Secondary | ICD-10-CM | POA: Diagnosis not present

## 2022-11-01 NOTE — Progress Notes (Signed)
[facility]   Reason for visit:  RLQ Ileostomy  nonhealing surgical wound to midline abdomen HPI:  Perforated diverticulum Past Medical History:  Diagnosis Date   Diabetes mellitus without complication (HCC)    Diverticulitis    DVT (deep venous thrombosis) (HCC)    x3   ETOH abuse    H/O colectomy    Hyperlipidemia    Hypertension    Smoker    Family History  Problem Relation Age of Onset   COPD Mother    Prostate cancer Father    Colon cancer Neg Hx    Rectal cancer Neg Hx    Stomach cancer Neg Hx    Esophageal cancer Neg Hx    Allergies  Allergen Reactions   Lisinopril     Other reaction(s): renal effects   Codeine Other (See Comments) and Hives    unknown Other reaction(s): Other (See Comments) unknown unknown   Current Outpatient Medications  Medication Sig Dispense Refill Last Dose   acetaminophen (TYLENOL) 650 MG CR tablet Take 650 mg by mouth every 8 (eight) hours as needed for pain.       amLODipine (NORVASC) 10 MG tablet Take 10 mg by mouth daily.      atorvastatin (LIPITOR) 10 MG tablet Take 10 mg by mouth daily.      cetirizine (ZYRTEC) 10 MG tablet Take 1 tablet every day by oral route in the evening for 90 days.      folic acid (FOLVITE) 1 MG tablet TAKE 1 TABLET(1 MG) BY MOUTH DAILY 90 tablet 3    metFORMIN (GLUCOPHAGE) 1000 MG tablet Take 500 mg by mouth daily.       metoprolol succinate (TOPROL-XL) 50 MG 24 hr tablet TAKE 1 TABLET(50 MG) BY MOUTH DAILY 90 tablet 2    Multiple Vitamin (MULTIVITAMIN WITH MINERALS) TABS tablet Take 1 tablet by mouth every other day.       thiamine 100 MG tablet Take 1 tablet (100 mg total) by mouth daily. 30 tablet 6    traMADol (ULTRAM) 50 MG tablet Take 1 tablet (50 mg total) by mouth every 6 (six) hours as needed for moderate pain. (Patient not taking: Reported on 07/14/2022) 30 tablet 0    triamcinolone cream (KENALOG) 0.5 % as directed. (Patient not taking: Reported on 07/14/2022)      No current  facility-administered medications for this encounter.   ROS  Review of Systems  Gastrointestinal:        Ileostomy hernia  Psychiatric/Behavioral:  The patient is nervous/anxious.        Is emotional over current health state.    All other systems reviewed and are negative.  Vital signs:  BP (!) 132/96 (BP Location: Right Arm)   Pulse 95   Temp 98.3 F (36.8 C) (Oral)   Resp 18   SpO2 95%  Exam:  Physical Exam Vitals reviewed.  Constitutional:      Appearance: He is obese.  Cardiovascular:     Comments: Pacemaker smoker Pulmonary:     Comments: smoker Abdominal:     Hernia: A hernia is present.  Skin:    Findings: Erythema and rash present.     Comments: Entire abdomen with contact dermatitis from medical adhesive and stretching of skin from hernia Peristomal skin with breakdown   Neurological:     Mental Status: He is alert and oriented to person, place, and time.  Psychiatric:        Mood and Affect: Mood normal.  Behavior: Behavior normal.     Stoma type/location:  1" recessed Stomal assessment/size:  pink moist and recessed Peristomal assessment:  contact dermatitis Treatment options for stomal/peristomal skin: 1 piece flat and barrier ring Output: liquid yellow effluent Ostomy pouching: 1pc. flat Education provided:  Has seen surgeon who has recommended referral to [facility].  The goal is to repair the hernia and reverse the ileostomy  He has expressed frustration with the time this is taking. I have encouraged him to take this time to pursue smoking cessation and weight reduction.  He agrees to try.     Impression/dx  Ileostomy Abdominal hernia Nonhealing surgical wound Discussion  See back as needed to assist with wound care, ostomy care and hernia interventions for comfort  Plan  Continue to work on smoking cessation and weight loss.     Visit time: 50 minutes.   Maple Hudson FNP-BC

## 2022-11-02 DIAGNOSIS — L24B1 Irritant contact dermatitis related to digestive stoma or fistula: Secondary | ICD-10-CM | POA: Insufficient documentation

## 2022-11-04 ENCOUNTER — Ambulatory Visit (INDEPENDENT_AMBULATORY_CARE_PROVIDER_SITE_OTHER): Payer: Medicaid Other | Admitting: Nurse Practitioner

## 2022-11-04 ENCOUNTER — Ambulatory Visit (INDEPENDENT_AMBULATORY_CARE_PROVIDER_SITE_OTHER): Payer: Medicaid Other

## 2022-11-04 ENCOUNTER — Encounter: Payer: Self-pay | Admitting: Nurse Practitioner

## 2022-11-04 VITALS — BP 146/84 | HR 97 | Temp 97.6°F | Wt 243.0 lb

## 2022-11-04 DIAGNOSIS — G4700 Insomnia, unspecified: Secondary | ICD-10-CM

## 2022-11-04 DIAGNOSIS — M79642 Pain in left hand: Secondary | ICD-10-CM | POA: Insufficient documentation

## 2022-11-04 DIAGNOSIS — Z6832 Body mass index (BMI) 32.0-32.9, adult: Secondary | ICD-10-CM

## 2022-11-04 DIAGNOSIS — T148XXA Other injury of unspecified body region, initial encounter: Secondary | ICD-10-CM

## 2022-11-04 DIAGNOSIS — Z7984 Long term (current) use of oral hypoglycemic drugs: Secondary | ICD-10-CM

## 2022-11-04 DIAGNOSIS — F17200 Nicotine dependence, unspecified, uncomplicated: Secondary | ICD-10-CM | POA: Insufficient documentation

## 2022-11-04 DIAGNOSIS — R0683 Snoring: Secondary | ICD-10-CM | POA: Insufficient documentation

## 2022-11-04 DIAGNOSIS — M1811 Unilateral primary osteoarthritis of first carpometacarpal joint, right hand: Secondary | ICD-10-CM | POA: Diagnosis not present

## 2022-11-04 DIAGNOSIS — M1812 Unilateral primary osteoarthritis of first carpometacarpal joint, left hand: Secondary | ICD-10-CM | POA: Diagnosis not present

## 2022-11-04 DIAGNOSIS — D582 Other hemoglobinopathies: Secondary | ICD-10-CM

## 2022-11-04 DIAGNOSIS — E669 Obesity, unspecified: Secondary | ICD-10-CM

## 2022-11-04 DIAGNOSIS — M79641 Pain in right hand: Secondary | ICD-10-CM | POA: Diagnosis not present

## 2022-11-04 DIAGNOSIS — T8189XA Other complications of procedures, not elsewhere classified, initial encounter: Secondary | ICD-10-CM | POA: Diagnosis not present

## 2022-11-04 DIAGNOSIS — E1142 Type 2 diabetes mellitus with diabetic polyneuropathy: Secondary | ICD-10-CM | POA: Diagnosis not present

## 2022-11-04 DIAGNOSIS — Z7689 Persons encountering health services in other specified circumstances: Secondary | ICD-10-CM | POA: Diagnosis not present

## 2022-11-04 LAB — COMPREHENSIVE METABOLIC PANEL
ALT: 30 U/L (ref 0–53)
AST: 37 U/L (ref 0–37)
Albumin: 4.2 g/dL (ref 3.5–5.2)
Alkaline Phosphatase: 145 U/L — ABNORMAL HIGH (ref 39–117)
BUN: 18 mg/dL (ref 6–23)
CO2: 28 mEq/L (ref 19–32)
Calcium: 9.6 mg/dL (ref 8.4–10.5)
Chloride: 100 mEq/L (ref 96–112)
Creatinine, Ser: 0.99 mg/dL (ref 0.40–1.50)
GFR: 81.22 mL/min (ref >60.00)
Glucose, Bld: 206 mg/dL — ABNORMAL HIGH (ref 70–99)
Potassium: 4.2 mEq/L (ref 3.5–5.1)
Sodium: 137 mEq/L (ref 135–145)
Total Bilirubin: 0.5 mg/dL (ref 0.2–1.2)
Total Protein: 8.1 g/dL (ref 6.0–8.3)

## 2022-11-04 LAB — CBC
HCT: 38.4 % — ABNORMAL LOW (ref 39.0–52.0)
Hemoglobin: 13 g/dL (ref 13.0–17.0)
MCHC: 33.7 g/dL (ref 30.0–36.0)
MCV: 103.8 fl — ABNORMAL HIGH (ref 78.0–100.0)
Platelets: 255 10*3/uL (ref 150.0–400.0)
RBC: 3.7 Mil/uL — ABNORMAL LOW (ref 4.22–5.81)
RDW: 14.7 % (ref 11.5–15.5)
WBC: 10.8 10*3/uL — ABNORMAL HIGH (ref 4.0–10.5)

## 2022-11-04 LAB — MICROALBUMIN / CREATININE URINE RATIO
Creatinine,U: 253.4 mg/dL
Microalb Creat Ratio: 44.9 mg/g — ABNORMAL HIGH (ref 0.0–30.0)
Microalb, Ur: 113.8 mg/dL — ABNORMAL HIGH (ref 0.0–1.9)

## 2022-11-04 LAB — LIPID PANEL
Cholesterol: 127 mg/dL (ref 0–200)
HDL: 32.5 mg/dL — ABNORMAL LOW (ref >39.00)
NonHDL: 94.5
Total CHOL/HDL Ratio: 4
Triglycerides: 371 mg/dL — ABNORMAL HIGH (ref 0.0–149.0)
VLDL: 74.2 mg/dL — ABNORMAL HIGH (ref 0.0–40.0)

## 2022-11-04 LAB — IRON: Iron: 68 ug/dL (ref 42–165)

## 2022-11-04 LAB — TSH: TSH: 3.96 u[IU]/mL (ref 0.35–5.50)

## 2022-11-04 LAB — HEMOGLOBIN A1C: Hgb A1c MFr Bld: 8 % — ABNORMAL HIGH (ref 4.6–6.5)

## 2022-11-04 LAB — FERRITIN: Ferritin: 124.3 ng/mL (ref 22.0–322.0)

## 2022-11-04 LAB — LDL CHOLESTEROL, DIRECT: Direct LDL: 51 mg/dL

## 2022-11-04 NOTE — Assessment & Plan Note (Signed)
Labs ordered, further recommendations may be made based upon his results. 

## 2022-11-04 NOTE — Assessment & Plan Note (Addendum)
Chronic Labs ordered, further recommendations may be made based upon his results Patient requesting referral to podiatry for foot care

## 2022-11-04 NOTE — Assessment & Plan Note (Signed)
Chronic He reports possible history of sleep apnea, recommend evaluation with sleep study to identify sleep apnea and if present to treat appropriately.  Referral to neurology made today. 

## 2022-11-04 NOTE — Assessment & Plan Note (Signed)
Chronic, progressively worsening. Continue Tylenol as needed for pain management Check x-ray of bilateral hands today, further recommendations may be made based upon these results.
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
## 2022-11-04 NOTE — Assessment & Plan Note (Signed)
Chronic Patient is aware that he needs to quit smoking to improve his health and ability to heal, as well as to be able to undergo necessary surgeries to repair his ileostomy. May discuss treatment options further at next office visit. In the meantime we will refer to the lung cancer screening to determine if he may be a candidate for this.

## 2022-11-04 NOTE — Assessment & Plan Note (Signed)
Chronic He reports possible history of sleep apnea, recommend evaluation with sleep study to identify sleep apnea and if present to treat appropriately.  Referral to neurology made today.

## 2022-11-04 NOTE — Assessment & Plan Note (Signed)
Chronic Current BMI 32.06 Patient has multiple chronic conditions that do impede his ability to increase his physical activity. For now he should focus on dietary changes to assist with weight loss, without protein wasting due to his nonhealing wound. May consider referral to dietitian at next office visit.

## 2022-11-04 NOTE — Assessment & Plan Note (Addendum)
Chronic Encouraged him to follow-up with the team at [facility] as well as wound care here in the meantime. I will refer to pain management to determine best pain management treatment plan. Patient took and felt improvement in pain with Tramadol in the past. He was encouraged to discuss this with his surgeon and wound care team while waiting to be established with pain management.  He would prefer to be established with a PCP that is comfortable managing his pain as opposed to seeing both a PCP and pain management specialist. Will ask the front to look into possible transfer of care to a new PCP.  In the meantime he is to continue taking tylenol

## 2022-11-08 ENCOUNTER — Ambulatory Visit (HOSPITAL_COMMUNITY): Payer: Medicaid Other | Admitting: Nurse Practitioner

## 2022-11-09 ENCOUNTER — Telehealth: Payer: Self-pay | Admitting: *Deleted

## 2022-11-09 NOTE — Progress Notes (Signed)
Remote pacemaker transmission.   

## 2022-11-09 NOTE — Telephone Encounter (Signed)
Pt called wanted to speak with Luis [name] about his tramadol pt mention that she do not give out tramadol and they spoke about someone else prescribing it. I was confuse about this situation.

## 2022-11-09 NOTE — Telephone Encounter (Signed)
Patient returning a missed call. Please call back @ [phone].

## 2022-11-10 ENCOUNTER — Telehealth: Payer: Self-pay

## 2022-11-10 ENCOUNTER — Encounter: Payer: Self-pay | Admitting: Physical Medicine and Rehabilitation

## 2022-11-10 ENCOUNTER — Telehealth: Payer: Self-pay | Admitting: Nurse Practitioner

## 2022-11-10 ENCOUNTER — Other Ambulatory Visit: Payer: Self-pay | Admitting: Nurse Practitioner

## 2022-11-10 DIAGNOSIS — F10230 Alcohol dependence with withdrawal, uncomplicated: Secondary | ICD-10-CM

## 2022-11-10 DIAGNOSIS — M79641 Pain in right hand: Secondary | ICD-10-CM

## 2022-11-10 DIAGNOSIS — J309 Allergic rhinitis, unspecified: Secondary | ICD-10-CM

## 2022-11-10 MED ORDER — CETIRIZINE HCL 10 MG PO TABS: 10.0000 mg | ORAL_TABLET | Freq: Every day | ORAL | 0 refills | Status: DC

## 2022-11-10 MED ORDER — THIAMINE HCL 100 MG PO TABS: 100.0000 mg | ORAL_TABLET | Freq: Every day | ORAL | 0 refills | Status: DC

## 2022-11-10 MED ORDER — ACETAMINOPHEN ER 650 MG PO TBCR
650.0000 mg | EXTENDED_RELEASE_TABLET | Freq: Three times a day (TID) | ORAL | 1 refills | Status: DC | PRN
Start: 2022-11-10 — End: 2023-01-01

## 2022-11-10 NOTE — Telephone Encounter (Signed)
Please call patient notify him that we are looking into transferring him to a different PCP if we are able to identify 1 that is comfortable with managing his pain as well as his other primary care needs.  In the interim, while waiting to be seen by pain management he could reach out to his general surgeon or wound care team to see if they would feel comfortable prescribing tramadol to manage his pain.

## 2022-11-10 NOTE — Progress Notes (Signed)
Estimated Creatinine Clearance: 99.4 mL/min (by C-G formula based on SCr of 0.99 mg/dL).

## 2022-11-10 NOTE — Telephone Encounter (Signed)
Left voice mail for pt to call back in regards to his question about tramadol

## 2022-11-10 NOTE — Telephone Encounter (Signed)
Patient was referred to pain management. They should call to get him scheduled.   Additionally, our office was going to look to see if any PCPs here would accept him in transfer of care if they feel comfortable managing his pain with tramadol in addition to his primary care needs. If transfer of care request has not been initiated can we do so?  I did send in refills of his tylenol, zyrtec, and thiamine per his request  Please also tell him that his labs have resulted and we need to discuss his diabetes treatment as well as an elevated liver enzyme found on labs. Please schedule him for f/u with me while he is waiting to establish with a new pcp so we can address these issues.

## 2022-11-10 NOTE — Telephone Encounter (Signed)
Pt is requesting transfer of care to a PCP who would feel comfortable managing his pain with tramadol in addition to his primary care needs.  Please advise if ok with transfer.

## 2022-11-10 NOTE — Telephone Encounter (Signed)
Sorry, no I would not be able to accept at this time, thanks

## 2022-11-10 NOTE — Telephone Encounter (Signed)
Not accepting new pts at this time

## 2022-11-10 NOTE — Telephone Encounter (Signed)
Made pt aware of lab results and for a follow up visit. Made pt aware that provider wants him to get his tramadol for pain management clinic. Pt stated they told him he would not be seen till July and he is in pain and need medication. Told pt he need to wait for his visit with them. Pt stated provider told him she was going to find another provider in the office to prescribe him his medication. Let pt now, this message will be forward to provider

## 2022-11-11 ENCOUNTER — Ambulatory Visit (HOSPITAL_COMMUNITY): Payer: Medicaid Other | Admitting: Nurse Practitioner

## 2022-11-11 NOTE — Telephone Encounter (Signed)
Pt returned grace call advise pt she was currently out of the office. Please advise

## 2022-11-11 NOTE — Telephone Encounter (Signed)
Left pt voicemail to call back

## 2022-11-11 NOTE — Telephone Encounter (Signed)
I can't ever promise a patient a certain medication or treatment since I do not know if this is appropriate.

## 2022-11-12 DIAGNOSIS — Z419 Encounter for procedure for purposes other than remedying health state, unspecified: Secondary | ICD-10-CM | POA: Diagnosis not present

## 2022-11-15 ENCOUNTER — Ambulatory Visit (HOSPITAL_COMMUNITY)
Admission: RE | Admit: 2022-11-15 | Discharge: 2022-11-15 | Disposition: A | Discharge status: Home / Self Care | Payer: Medicaid Other | Source: Ambulatory Visit | Attending: Nurse Practitioner | Admitting: Nurse Practitioner

## 2022-11-15 DIAGNOSIS — T148XXA Other injury of unspecified body region, initial encounter: Secondary | ICD-10-CM | POA: Diagnosis not present

## 2022-11-15 DIAGNOSIS — Z432 Encounter for attention to ileostomy: Secondary | ICD-10-CM | POA: Insufficient documentation

## 2022-11-15 DIAGNOSIS — K9413 Enterostomy malfunction: Secondary | ICD-10-CM

## 2022-11-15 DIAGNOSIS — K469 Unspecified abdominal hernia without obstruction or gangrene: Secondary | ICD-10-CM | POA: Diagnosis not present

## 2022-11-15 DIAGNOSIS — Z932 Ileostomy status: Secondary | ICD-10-CM | POA: Diagnosis present

## 2022-11-15 DIAGNOSIS — Z7689 Persons encountering health services in other specified circumstances: Secondary | ICD-10-CM | POA: Diagnosis not present

## 2022-11-15 DIAGNOSIS — L24B3 Irritant contact dermatitis related to fecal or urinary stoma or fistula: Secondary | ICD-10-CM | POA: Diagnosis not present

## 2022-11-15 MED ORDER — SILVER NITRATE-POT NITRATE 75-25 % EX MISC
1.0000 | Freq: Once | CUTANEOUS | Status: DC
  Filled 2022-11-15: qty 1

## 2022-11-15 NOTE — Progress Notes (Signed)
[facility]   Reason for visit:  RLQ ileostomy Abdominal hernia NOnhealing surgical wound HPI:  Perforated diverticulitis with ileostomy Past Medical History:  Diagnosis Date   Diabetes mellitus without complication (HCC)    Diverticulitis    DVT (deep venous thrombosis) (HCC)    x3   ETOH abuse    H/O colectomy    Hyperlipidemia    Hypertension    Smoker    Family History  Problem Relation Age of Onset   COPD Mother    Prostate cancer Father    Colon cancer Neg Hx    Rectal cancer Neg Hx    Stomach cancer Neg Hx    Esophageal cancer Neg Hx    Allergies  Allergen Reactions   Lisinopril     Other reaction(s): renal effects   Codeine Other (See Comments) and Hives    unknown Other reaction(s): Other (See Comments) unknown unknown   Current Outpatient Medications  Medication Sig Dispense Refill Last Dose   acetaminophen (TYLENOL) 650 MG CR tablet Take 1 tablet (650 mg total) by mouth every 8 (eight) hours as needed for pain. 90 tablet 1    amLODipine (NORVASC) 10 MG tablet Take 10 mg by mouth daily.      atorvastatin (LIPITOR) 10 MG tablet Take 10 mg by mouth daily.      cetirizine (ZYRTEC) 10 MG tablet Take 1 tablet (10 mg total) by mouth daily. 90 tablet 0    folic acid (FOLVITE) 1 MG tablet TAKE 1 TABLET(1 MG) BY MOUTH DAILY 90 tablet 3    metFORMIN (GLUCOPHAGE) 1000 MG tablet Take 0.5 tablets (500 mg total) by mouth daily with breakfast. 30 tablet 0    metoprolol succinate (TOPROL-XL) 50 MG 24 hr tablet TAKE 1 TABLET(50 MG) BY MOUTH DAILY 90 tablet 2    Multiple Vitamin (MULTIVITAMIN WITH MINERALS) TABS tablet Take 1 tablet by mouth every other day.       thiamine (VITAMIN B1) 100 MG tablet Take 1 tablet (100 mg total) by mouth daily. 90 tablet 0    traMADol (ULTRAM) 50 MG tablet Take 1 tablet (50 mg total) by mouth every 6 (six) hours as needed for moderate pain. (Patient not taking: Reported on 07/14/2022) 30 tablet 0    triamcinolone cream (KENALOG)  0.5 % as directed.      No current facility-administered medications for this encounter.   ROS  Review of Systems  Constitutional:  Positive for fatigue.  HENT:  Positive for dental problem.        REcently had extensive dental repairs  Respiratory:  Positive for cough.        Smoker  Gastrointestinal:        RLQ ileostomy   Musculoskeletal:  Positive for gait problem.   Vital signs:  BP (!) 141/97   Pulse 95   Temp 98.9 F (37.2 C)   SpO2 94%  Exam:  Physical Exam Vitals reviewed.  Constitutional:      Appearance: He is obese.  Abdominal:     Hernia: A hernia is present.  Skin:    General: Skin is warm and dry.     Findings: Erythema, lesion and rash present.  Neurological:     Mental Status: He is alert and oriented to person, place, and time.     Sensory: Sensory deficit present.     Comments: Recently addressed neuropathy in feet with PCP.  Psychiatric:        Mood and Affect: Mood  normal.        Behavior: Behavior normal.     Stoma type/location:  RLQ ileostomy Stomal assessment/size:  1", flush Peristomal assessment:  denduded skin, uses skin Tac adhesive Treatment options for stomal/peristomal skin: stoma powder skin prep barrier ring  1piece flat pouch with skin tac adhesive Output: soft yellow stool Ostomy pouching: 1pc. Education provided:  Ostomy pouch changed, wound care provided to midline wound (aquacel ) and weight of abdominal hernia is supported with an ace wrap around trunk.     Impression/dx  Contact dermatitis Ileostomy Hernia Nonhealing wound Discussion  HAs talked to surgeon, awaiting appointment with [facility] hernia team, has established care with new PCP and feels like they listened and he had a good appointment.  Plan  See back as needed.     Visit time: 40 minutes.   Maple Hudson FNP-BC

## 2022-11-17 ENCOUNTER — Ambulatory Visit: Payer: Medicaid Other | Admitting: Nurse Practitioner

## 2022-11-17 ENCOUNTER — Telehealth: Payer: Self-pay

## 2022-11-17 ENCOUNTER — Other Ambulatory Visit: Payer: Self-pay | Admitting: Nurse Practitioner

## 2022-11-17 DIAGNOSIS — E1142 Type 2 diabetes mellitus with diabetic polyneuropathy: Secondary | ICD-10-CM

## 2022-11-17 DIAGNOSIS — Z7689 Persons encountering health services in other specified circumstances: Secondary | ICD-10-CM | POA: Diagnosis not present

## 2022-11-17 MED ORDER — METFORMIN HCL 1000 MG PO TABS
500.0000 mg | ORAL_TABLET | Freq: Every day | ORAL | 0 refills | Status: DC
Start: 2022-11-17 — End: 2022-12-09

## 2022-11-17 NOTE — Telephone Encounter (Signed)
Patient needs his metformin refilled - please send to [facility] on [address].

## 2022-11-17 NOTE — Telephone Encounter (Signed)
Medication send to pharmacy by provider, Also let pt know that at the current moment none of the providers are accepting new pt. Pt showed understanding

## 2022-11-22 ENCOUNTER — Ambulatory Visit (HOSPITAL_COMMUNITY)
Admission: RE | Admit: 2022-11-22 | Discharge: 2022-11-22 | Disposition: A | Discharge status: Home / Self Care | Payer: Medicaid Other | Source: Ambulatory Visit | Attending: Nurse Practitioner | Admitting: Nurse Practitioner

## 2022-11-22 DIAGNOSIS — K9413 Enterostomy malfunction: Secondary | ICD-10-CM | POA: Diagnosis not present

## 2022-11-22 DIAGNOSIS — T8189XD Other complications of procedures, not elsewhere classified, subsequent encounter: Secondary | ICD-10-CM | POA: Diagnosis not present

## 2022-11-22 DIAGNOSIS — Z7689 Persons encountering health services in other specified circumstances: Secondary | ICD-10-CM | POA: Diagnosis not present

## 2022-11-22 DIAGNOSIS — K9419 Other complications of enterostomy: Secondary | ICD-10-CM | POA: Diagnosis not present

## 2022-11-22 DIAGNOSIS — Y838 Other surgical procedures as the cause of abnormal reaction of the patient, or of later complication, without mention of misadventure at the time of the procedure: Secondary | ICD-10-CM | POA: Diagnosis not present

## 2022-11-22 DIAGNOSIS — L24B3 Irritant contact dermatitis related to fecal or urinary stoma or fistula: Secondary | ICD-10-CM | POA: Diagnosis not present

## 2022-11-22 DIAGNOSIS — T8189XA Other complications of procedures, not elsewhere classified, initial encounter: Secondary | ICD-10-CM | POA: Insufficient documentation

## 2022-11-22 DIAGNOSIS — Z932 Ileostomy status: Secondary | ICD-10-CM | POA: Diagnosis present

## 2022-11-22 DIAGNOSIS — K469 Unspecified abdominal hernia without obstruction or gangrene: Secondary | ICD-10-CM | POA: Insufficient documentation

## 2022-11-22 NOTE — Progress Notes (Signed)
[facility]   Reason for visit:  RLQ ileostomy Abdominal hernia Nonhealing surgical wounds HPI:  Diverticulitis with ileostomy Abdominal hernia Past Medical History:  Diagnosis Date  . Diabetes mellitus without complication (HCC)   . Diverticulitis   . DVT (deep venous thrombosis) (HCC)    x3  . ETOH abuse   . H/O colectomy   . Hyperlipidemia   . Hypertension   . Smoker    Family History  Problem Relation Age of Onset  . COPD Mother   . Prostate cancer Father   . Colon cancer Neg Hx   . Rectal cancer Neg Hx   . Stomach cancer Neg Hx   . Esophageal cancer Neg Hx    Allergies  Allergen Reactions  . Lisinopril     Other reaction(s): renal effects  . Codeine Other (See Comments) and Hives    unknown Other reaction(s): Other (See Comments) unknown unknown   Current Outpatient Medications  Medication Sig Dispense Refill Last Dose  . acetaminophen (TYLENOL) 650 MG CR tablet Take 1 tablet (650 mg total) by mouth every 8 (eight) hours as needed for pain. 90 tablet 1   . amLODipine (NORVASC) 10 MG tablet Take 10 mg by mouth daily.     Marland Kitchen atorvastatin (LIPITOR) 10 MG tablet Take 10 mg by mouth daily.     . cetirizine (ZYRTEC) 10 MG tablet Take 1 tablet (10 mg total) by mouth daily. 90 tablet 0   . folic acid (FOLVITE) 1 MG tablet TAKE 1 TABLET(1 MG) BY MOUTH DAILY 90 tablet 3   . metFORMIN (GLUCOPHAGE) 1000 MG tablet Take 0.5 tablets (500 mg total) by mouth daily with breakfast. 30 tablet 0   . metoprolol succinate (TOPROL-XL) 50 MG 24 hr tablet TAKE 1 TABLET(50 MG) BY MOUTH DAILY 90 tablet 2   . Multiple Vitamin (MULTIVITAMIN WITH MINERALS) TABS tablet Take 1 tablet by mouth every other day.      . thiamine (VITAMIN B1) 100 MG tablet Take 1 tablet (100 mg total) by mouth daily. 90 tablet 0   . traMADol (ULTRAM) 50 MG tablet Take 1 tablet (50 mg total) by mouth every 6 (six) hours as needed for moderate pain. (Patient not taking: Reported on 07/14/2022) 30 tablet 0    . triamcinolone cream (KENALOG) 0.5 % as directed.      No current facility-administered medications for this encounter.   ROS  Review of Systems  Constitutional:  Positive for fatigue.  Respiratory:  Positive for cough.        Smoker  Gastrointestinal:  Positive for abdominal pain.       Abdominal hernia Rlq ileostomy  Skin:  Positive for color change, rash and wound.  Allergic/Immunologic: Positive for environmental allergies.  Neurological:        States he has neuropathy in his feet.   Psychiatric/Behavioral: Negative.    All other systems reviewed and are negative. Vital signs:  BP (!) 153/83 (BP Location: Right Arm)   Pulse 96   Temp (!) 97.4 F (36.3 C) (Oral)   Resp 20   SpO2 94%  Exam:  Physical Exam Vitals reviewed.  Constitutional:      Appearance: He is obese.  Abdominal:     Hernia: A hernia is present.  Skin:    General: Skin is warm and dry.     Findings: Erythema, lesion and rash present.  Neurological:     Mental Status: He is alert and oriented to person, place, and  time.  Psychiatric:        Mood and Affect: Mood normal.        Behavior: Behavior normal.    Stoma type/location:  RLQ ileostomy Stomal assessment/size:  1" recessed Peristomal assessment:  contact dermatitis, improved today.  Fissure to skin between abdomen and inguinal fold, applied calmoseptine to this area.  Patient cannot easily visualize this area.  Treatment options for stomal/peristomal skin: 1 piece pouch with barrier ring Output: liquid yellow stool Ostomy pouching: 1pc.pouch with barrier ring  Education provided:  surgical wound to abdomen has improved.  Open lesions are smaller and some have epithelialized.  Abdomen with plaques of dry, peeling skin.  Consistent with medical adhesive irritation. Patient tapes and reinforces dressing with more tape (medipore) as needed.  I have encouraged him to use as little tape as possible.  Everything is held in place with an ace wrap to  support weight of abdominal hernia    Impression/dx  Contact dermatitis Abdominal hernia Nonhealing surgical wound Discussion  Scaling back use of tape and silver dressings.  Only covering affected open areas.  Applying triamcinolone cream to skin breakdown (previously would not use because tape would stick)  Plan  See back as needed.     Visit time: 40 minutes.   Maple Hudson FNP-BC

## 2022-11-23 ENCOUNTER — Ambulatory Visit: Payer: Medicaid Other | Admitting: Podiatry

## 2022-11-24 DIAGNOSIS — T8189XD Other complications of procedures, not elsewhere classified, subsequent encounter: Secondary | ICD-10-CM | POA: Insufficient documentation

## 2022-11-29 ENCOUNTER — Ambulatory Visit (HOSPITAL_COMMUNITY)
Admission: RE | Admit: 2022-11-29 | Discharge: 2022-11-29 | Disposition: A | Discharge status: Home / Self Care | Payer: Medicaid Other | Source: Ambulatory Visit | Attending: Nurse Practitioner | Admitting: Nurse Practitioner

## 2022-11-29 DIAGNOSIS — K9419 Other complications of enterostomy: Secondary | ICD-10-CM | POA: Insufficient documentation

## 2022-11-29 DIAGNOSIS — L24B3 Irritant contact dermatitis related to fecal or urinary stoma or fistula: Secondary | ICD-10-CM

## 2022-11-29 DIAGNOSIS — Y838 Other surgical procedures as the cause of abnormal reaction of the patient, or of later complication, without mention of misadventure at the time of the procedure: Secondary | ICD-10-CM | POA: Insufficient documentation

## 2022-11-29 DIAGNOSIS — Z932 Ileostomy status: Secondary | ICD-10-CM | POA: Diagnosis present

## 2022-11-29 DIAGNOSIS — K9413 Enterostomy malfunction: Secondary | ICD-10-CM | POA: Diagnosis not present

## 2022-11-29 DIAGNOSIS — K439 Ventral hernia without obstruction or gangrene: Secondary | ICD-10-CM

## 2022-11-29 DIAGNOSIS — K469 Unspecified abdominal hernia without obstruction or gangrene: Secondary | ICD-10-CM | POA: Diagnosis not present

## 2022-11-29 NOTE — Progress Notes (Signed)
[facility]   Reason for visit:  RLQ Ileostomy Nonhealing abdominal wound Abdominal hernia HPI:   Past Medical History:  Diagnosis Date   Diabetes mellitus without complication (HCC)    Diverticulitis    DVT (deep venous thrombosis) (HCC)    x3   ETOH abuse    H/O colectomy    Hyperlipidemia    Hypertension    Smoker    Family History  Problem Relation Age of Onset   COPD Mother    Prostate cancer Father    Colon cancer Neg Hx    Rectal cancer Neg Hx    Stomach cancer Neg Hx    Esophageal cancer Neg Hx    Allergies  Allergen Reactions   Lisinopril     Other reaction(s): renal effects   Codeine Other (See Comments) and Hives    unknown Other reaction(s): Other (See Comments) unknown unknown   Current Outpatient Medications  Medication Sig Dispense Refill Last Dose   acetaminophen (TYLENOL) 650 MG CR tablet Take 1 tablet (650 mg total) by mouth every 8 (eight) hours as needed for pain. 90 tablet 1    amLODipine (NORVASC) 10 MG tablet Take 10 mg by mouth daily.      atorvastatin (LIPITOR) 10 MG tablet Take 10 mg by mouth daily.      cetirizine (ZYRTEC) 10 MG tablet Take 1 tablet (10 mg total) by mouth daily. 90 tablet 0    folic acid (FOLVITE) 1 MG tablet TAKE 1 TABLET(1 MG) BY MOUTH DAILY 90 tablet 3    metFORMIN (GLUCOPHAGE) 1000 MG tablet Take 0.5 tablets (500 mg total) by mouth daily with breakfast. 30 tablet 0    metoprolol succinate (TOPROL-XL) 50 MG 24 hr tablet TAKE 1 TABLET(50 MG) BY MOUTH DAILY 90 tablet 2    Multiple Vitamin (MULTIVITAMIN WITH MINERALS) TABS tablet Take 1 tablet by mouth every other day.       thiamine (VITAMIN B1) 100 MG tablet Take 1 tablet (100 mg total) by mouth daily. 90 tablet 0    traMADol (ULTRAM) 50 MG tablet Take 1 tablet (50 mg total) by mouth every 6 (six) hours as needed for moderate pain. (Patient not taking: Reported on 07/14/2022) 30 tablet 0    triamcinolone cream (KENALOG) 0.5 % as directed.      No current  facility-administered medications for this encounter.   ROS  Review of Systems  Gastrointestinal:        RLQ ileostomy Abdominal hernia   Endocrine:       Feels as if he has neuropathy in feet  Psychiatric/Behavioral: Negative.    All other systems reviewed and are negative.  Vital signs:  BP (!) 173/102   Pulse 95   Temp 97.9 F (36.6 C)   Resp 19   SpO2 94%  Exam:  Physical Exam Vitals (BP elevated, angry that his ride was late. Discharging BP was 144.86) reviewed.  Constitutional:      Appearance: He is obese.  Cardiovascular:     Comments: Pacemaker Pulmonary:     Comments: smoker Abdominal:     Hernia: A hernia is present.  Skin:    General: Skin is warm and dry.     Findings: Erythema and rash present.     Comments: Peristomal skin, periwound skin to abdomen is red and peeling due to medical adhesive and use of Skin Tac.  Attempting to heal this with minimal tape.   Neurological:     Mental Status:  He is alert and oriented to person, place, and time.  Psychiatric:        Mood and Affect: Mood normal.        Behavior: Behavior normal.     Stoma type/location:  RLQ ileostomy Stomal assessment/size:  1"  flush Peristomal assessment:  intact Treatment options for stomal/peristomal skin: barrier ring  and 1 piece flat pouch Output: soft brown stool Ostomy pouching: 1pc.  Uses skin tac for adhesion Education provided:  WOund care to abdominal wound.  COntinue silver hydrofiber.  WE are washing periwound skin with VASHE solution and leaving it open to air.  Minimal tape.  Attempting to heal the irritated intact skin.     Impression/dx  Ileostomy Hernia Nonhealing wound Discussion  Performed ostomy care and wound care.  Changing skin care/  Supporting weight of hernia with ace wrap.  Plan  See back 1 week to evaluate skin and stoma    Visit time: 45 minutes.   Maple Hudson FNP-BC

## 2022-12-02 DIAGNOSIS — K439 Ventral hernia without obstruction or gangrene: Secondary | ICD-10-CM | POA: Insufficient documentation

## 2022-12-05 ENCOUNTER — Ambulatory Visit (HOSPITAL_COMMUNITY)
Admission: RE | Admit: 2022-12-05 | Discharge: 2022-12-05 | Disposition: A | Discharge status: Home / Self Care | Payer: Medicaid Other | Source: Ambulatory Visit | Attending: Nurse Practitioner | Admitting: Nurse Practitioner

## 2022-12-05 DIAGNOSIS — K9419 Other complications of enterostomy: Secondary | ICD-10-CM | POA: Diagnosis not present

## 2022-12-05 DIAGNOSIS — Y838 Other surgical procedures as the cause of abnormal reaction of the patient, or of later complication, without mention of misadventure at the time of the procedure: Secondary | ICD-10-CM | POA: Diagnosis not present

## 2022-12-05 DIAGNOSIS — L24B3 Irritant contact dermatitis related to fecal or urinary stoma or fistula: Secondary | ICD-10-CM | POA: Diagnosis not present

## 2022-12-05 DIAGNOSIS — T8189XA Other complications of procedures, not elsewhere classified, initial encounter: Secondary | ICD-10-CM | POA: Diagnosis not present

## 2022-12-05 DIAGNOSIS — K435 Parastomal hernia without obstruction or  gangrene: Secondary | ICD-10-CM | POA: Diagnosis not present

## 2022-12-05 DIAGNOSIS — Z7689 Persons encountering health services in other specified circumstances: Secondary | ICD-10-CM | POA: Diagnosis not present

## 2022-12-05 DIAGNOSIS — Z932 Ileostomy status: Secondary | ICD-10-CM | POA: Diagnosis present

## 2022-12-05 NOTE — Progress Notes (Signed)
[facility]   Reason for visit:  RLQ ileostomy Nonhealing midline abdominal wound Abdominal hernia HPI:  Perforated diverticulitis with end colostomy Past Medical History:  Diagnosis Date   Diabetes mellitus without complication (HCC)    Diverticulitis    DVT (deep venous thrombosis) (HCC)    x3   ETOH abuse    H/O colectomy    Hyperlipidemia    Hypertension    Smoker    Family History  Problem Relation Age of Onset   COPD Mother    Prostate cancer Father    Colon cancer Neg Hx    Rectal cancer Neg Hx    Stomach cancer Neg Hx    Esophageal cancer Neg Hx    Allergies  Allergen Reactions   Lisinopril     Other reaction(s): renal effects   Codeine Other (See Comments) and Hives    unknown Other reaction(s): Other (See Comments) unknown unknown   Current Outpatient Medications  Medication Sig Dispense Refill Last Dose   acetaminophen (TYLENOL) 650 MG CR tablet Take 1 tablet (650 mg total) by mouth every 8 (eight) hours as needed for pain. 90 tablet 1    amLODipine (NORVASC) 10 MG tablet Take 10 mg by mouth daily.      atorvastatin (LIPITOR) 10 MG tablet Take 10 mg by mouth daily.      cetirizine (ZYRTEC) 10 MG tablet Take 1 tablet (10 mg total) by mouth daily. 90 tablet 0    folic acid (FOLVITE) 1 MG tablet TAKE 1 TABLET(1 MG) BY MOUTH DAILY 90 tablet 3    metFORMIN (GLUCOPHAGE) 1000 MG tablet Take 0.5 tablets (500 mg total) by mouth daily with breakfast. 30 tablet 0    metoprolol succinate (TOPROL-XL) 50 MG 24 hr tablet TAKE 1 TABLET(50 MG) BY MOUTH DAILY 90 tablet 2    Multiple Vitamin (MULTIVITAMIN WITH MINERALS) TABS tablet Take 1 tablet by mouth every other day.       thiamine (VITAMIN B1) 100 MG tablet Take 1 tablet (100 mg total) by mouth daily. 90 tablet 0    traMADol (ULTRAM) 50 MG tablet Take 1 tablet (50 mg total) by mouth every 6 (six) hours as needed for moderate pain. (Patient not taking: Reported on 07/14/2022) 30 tablet 0    triamcinolone  cream (KENALOG) 0.5 % as directed.      No current facility-administered medications for this encounter.   ROS  Review of Systems  Constitutional:  Positive for fatigue.  Respiratory:  Positive for cough.        Smoker  Gastrointestinal:        RLQ ileostomy Abdominal hernia  Musculoskeletal:  Positive for gait problem.  Skin:  Positive for color change, rash and wound.  Psychiatric/Behavioral: Negative.    All other systems reviewed and are negative.  Vital signs:  BP (!) 154/89 (BP Location: Right Arm)   Pulse 94   Temp 98 F (36.7 C) (Oral)   Resp 20   SpO2 93%  Exam:  Physical Exam Vitals reviewed.  Constitutional:      Appearance: He is obese.  Abdominal:     Hernia: A hernia is present.  Musculoskeletal:     Comments: weakness  Skin:    General: Skin is warm and dry.     Findings: Erythema and rash present.  Neurological:     Mental Status: He is alert and oriented to person, place, and time.     Gait: Gait abnormal.  Comments: Feels he has neuropathy in feet, diminished sensation     Stoma type/location:  RLQ ileostomy Stomal assessment/size:  1" recessed Peristomal assessment:  contact dermatitis Treatment options for stomal/peristomal skin: barrier ring and 1 piece pouch with skin TAC Output: soft brown stool Ostomy pouching: 1pc Education provided:  Using VASHE to peristomal skin, periwound skin and this skin is improving.  Using minimal tape to skin around open wounds. Photos obtained.  Wound care with silver hydrofiber to open opens.  Cover with gauze, and support with ace wrap.     Impression/dx  Medical adhesive related skin injury Ileostomy Nonhealing wound to abdomen Discussion  Follow up appointment with surgeon this week Plan  Follow up as needed    Visit time: 45 minutes.   Maple Hudson FNP-BC

## 2022-12-06 DIAGNOSIS — Z7689 Persons encountering health services in other specified circumstances: Secondary | ICD-10-CM | POA: Diagnosis not present

## 2022-12-06 DIAGNOSIS — R21 Rash and other nonspecific skin eruption: Secondary | ICD-10-CM | POA: Diagnosis not present

## 2022-12-06 DIAGNOSIS — L539 Erythematous condition, unspecified: Secondary | ICD-10-CM | POA: Diagnosis not present

## 2022-12-06 DIAGNOSIS — K432 Incisional hernia without obstruction or gangrene: Secondary | ICD-10-CM | POA: Diagnosis not present

## 2022-12-06 DIAGNOSIS — Z87891 Personal history of nicotine dependence: Secondary | ICD-10-CM | POA: Diagnosis not present

## 2022-12-07 DIAGNOSIS — S31109A Unspecified open wound of abdominal wall, unspecified quadrant without penetration into peritoneal cavity, initial encounter: Secondary | ICD-10-CM | POA: Diagnosis not present

## 2022-12-07 DIAGNOSIS — Z932 Ileostomy status: Secondary | ICD-10-CM | POA: Diagnosis not present

## 2022-12-07 DIAGNOSIS — K469 Unspecified abdominal hernia without obstruction or gangrene: Secondary | ICD-10-CM | POA: Diagnosis not present

## 2022-12-07 NOTE — Discharge Instructions (Signed)
Continue Vashe solution to skin around stoma and wound.

## 2022-12-09 ENCOUNTER — Ambulatory Visit (INDEPENDENT_AMBULATORY_CARE_PROVIDER_SITE_OTHER): Payer: Medicaid Other | Admitting: Nurse Practitioner

## 2022-12-09 VITALS — BP 148/92 | HR 99 | Temp 98.1°F | Ht 73.0 in | Wt 244.0 lb

## 2022-12-09 DIAGNOSIS — E1142 Type 2 diabetes mellitus with diabetic polyneuropathy: Secondary | ICD-10-CM

## 2022-12-09 DIAGNOSIS — T148XXA Other injury of unspecified body region, initial encounter: Secondary | ICD-10-CM | POA: Diagnosis not present

## 2022-12-09 DIAGNOSIS — Z1283 Encounter for screening for malignant neoplasm of skin: Secondary | ICD-10-CM | POA: Insufficient documentation

## 2022-12-09 DIAGNOSIS — Z716 Tobacco abuse counseling: Secondary | ICD-10-CM | POA: Diagnosis not present

## 2022-12-09 DIAGNOSIS — Z125 Encounter for screening for malignant neoplasm of prostate: Secondary | ICD-10-CM

## 2022-12-09 DIAGNOSIS — L309 Dermatitis, unspecified: Secondary | ICD-10-CM | POA: Diagnosis not present

## 2022-12-09 DIAGNOSIS — R748 Abnormal levels of other serum enzymes: Secondary | ICD-10-CM | POA: Diagnosis not present

## 2022-12-09 DIAGNOSIS — Z7689 Persons encountering health services in other specified circumstances: Secondary | ICD-10-CM | POA: Diagnosis not present

## 2022-12-09 DIAGNOSIS — F1020 Alcohol dependence, uncomplicated: Secondary | ICD-10-CM

## 2022-12-09 DIAGNOSIS — D72829 Elevated white blood cell count, unspecified: Secondary | ICD-10-CM | POA: Diagnosis not present

## 2022-12-09 MED ORDER — TRIAMCINOLONE ACETONIDE 0.5 % EX CREA
TOPICAL_CREAM | CUTANEOUS | 1 refills | Status: DC
Start: 2022-12-09 — End: 2023-01-01

## 2022-12-09 MED ORDER — METFORMIN HCL 1000 MG PO TABS
500.0000 mg | ORAL_TABLET | Freq: Two times a day (BID) | ORAL | 0 refills | Status: DC
Start: 2022-12-09 — End: 2023-01-20

## 2022-12-09 MED ORDER — BUPROPION HCL ER (SR) 150 MG PO TB12
ORAL_TABLET | ORAL | 2 refills | Status: DC
Start: 2022-12-09 — End: 2023-01-01

## 2022-12-09 NOTE — Assessment & Plan Note (Signed)
Referral to dermatology to consider undergoing skin biopsy of the surrounding skin of his wound to rule out skin cancer.

## 2022-12-09 NOTE — Assessment & Plan Note (Addendum)
Chronic Discussed smoking cessation we will plan on treating with Wellbutrin 150 mg tablet once a day x 3 days then increase to Wellbutrin 150 mg twice daily. He was counseled on risk of seizures especially if he tries to abruptly stop drinking alcohol at the same time as starting Wellbutrin.  He was told not to do this and instead seek treatment with inpatient detox center.

## 2022-12-09 NOTE — Assessment & Plan Note (Signed)
Referral to dermatology sent today to consider undergoing biopsy of surrounding skin of surgical wound to rule out skin cancer.

## 2022-12-09 NOTE — Patient Instructions (Addendum)
Eucerin eczema cream -- apply to legs twice a day for your dry skin  Stop all sugary beverages, drink water or sugar free  Wellbutrin and stopping alcohol intake "cold [place]" can increase risk of seizures so we need to take a step wise approach to do this safely with multidisciplinary team. I have referred to social work. Can reach out to [facility] [phone] and [facility]  [phone] for assistance as well.   Contact alcoholics anonymous

## 2022-12-09 NOTE — Assessment & Plan Note (Signed)
Chronic I agree that patient needs to stop drinking alcohol.  I did consult with backup supervising physician (Dr. Yetta Barre) who recommends patient be referred to inpatient detox due to how often and how much patient reports drinking and subsequent risk of DTs/seizure. Referral to social work made today and patient provided with phone number to [facility] health outpatient office as well as the [facility] to see if either of these can offer him inpatient detox options Additionally, I am planning to prescribe him Wellbutrin to assist with smoking cessation.  He denies any personal history of seizures.  However I did tell him that Wellbutrin can lower seizure threshold and he should avoid abrupt discontinuation of alcohol intake in addition to starting Wellbutrin due to high risk of possible seizure. He reports understanding.

## 2022-12-09 NOTE — Assessment & Plan Note (Signed)
Labs ordered, further recommendations may be made based upon his results. 

## 2022-12-09 NOTE — Assessment & Plan Note (Signed)
Chronic A1c above goal Patient does admit to drinking frequent sugary beverages.  He was counseled to stop drinking sugary beverages as well as to avoid processed carbs and to focus on leafy greens, proteins.  Per shared decision making we will also increase metformin to 500 mg twice a day.

## 2022-12-09 NOTE — Progress Notes (Signed)
Established Patient Office Visit  Subjective   Patient ID: Luis [name], male    DOB: 1961-03-22  Age: 62 y.o. MRN: [id number]  Chief Complaint  Patient presents with   Medical Management of Chronic Issues    Labs following up    Diabetes: A1c collected last month which was identified to be above goal at 8.  Patient currently on metformin 500 mg daily.  He is also on atorvastatin 10 mg daily with last LDL 51.  Last GFR 81.  Not currently on ACE or ARB.  Smoking Cessation/Alcoholism: Patient has chronic wound of his abdomen, and has been told by multiple providers that he needs to quit smoking and quit drinking alcohol.  He would like to discuss assistance with this.  Patient reports currently drinking up to 1/5 of liquor a day and drinks just about every day.  He does not drive.  Denies any history of seizure.  Reports that he is smoked for about 48 years and has quit intermittently over that time.   Eczema: Would like referral to dermatology for assistance with managing his eczema as well as evaluation of the skin around his chronic wound.  He was told by a provider at [facility] that there may be skin cancer present and he needs to have this biopsied.  He would prefer to see a dermatologist around [place] as this is closer to him then [facility] area.  Elevated alkaline phosphatase: Incidental finding on last labs, due to have this rechecked  Prostate cancer screening: Due to have this completed  Leukocytosis: Incidental finding on lab work    ROS: see HPI    Objective:     BP (!) 148/92   Pulse 99   Temp 98.1 F (36.7 C) (Temporal)   Ht 6\' 1"  (1.854 m)   Wt 244 lb (110.7 kg)   SpO2 92%   BMI 32.19 kg/m    Physical Exam Vitals reviewed.  Constitutional:      Appearance: Normal appearance.  HENT:     Head: Normocephalic and atraumatic.  Cardiovascular:     Rate and Rhythm: Normal rate and regular rhythm.  Pulmonary:     Effort: Pulmonary effort is normal.      Breath sounds: Normal breath sounds.  Musculoskeletal:     Cervical back: Neck supple.  Skin:    General: Skin is warm and dry.  Neurological:     Mental Status: He is alert and oriented to person, place, and time.  Psychiatric:        Mood and Affect: Mood normal.        Behavior: Behavior normal.        Thought Content: Thought content normal.        Judgment: Judgment normal.      No results found for any visits on 12/09/22.    The ASCVD Risk score (Arnett DK, et al., 2019) failed to calculate for the following reasons:   The valid total cholesterol range is 130 to 320 mg/dL    Assessment & Plan:   Problem List Items Addressed This Visit       Endocrine   DM2 (diabetes mellitus, type 2) (HCC) (Chronic)    Chronic A1c above goal Patient does admit to drinking frequent sugary beverages.  He was counseled to stop drinking sugary beverages as well as to avoid processed carbs and to focus on leafy greens, proteins.  Per shared decision making we will also increase metformin to 500 mg twice a  day.      Relevant Medications   metFORMIN (GLUCOPHAGE) 1000 MG tablet     Musculoskeletal and Integument   Dermatitis    Referral to dermatology made today and refill of Kenalog cream prescribed.      Relevant Medications   triamcinolone cream (KENALOG) 0.5 %     Other   Nonhealing nonsurgical wound - Primary    Referral to dermatology to consider undergoing skin biopsy of the surrounding skin of his wound to rule out skin cancer.      Relevant Orders   Ambulatory referral to Dermatology   Skin cancer screening    Referral to dermatology sent today to consider undergoing biopsy of surrounding skin of surgical wound to rule out skin cancer.      Relevant Orders   Ambulatory referral to Dermatology   Encounter for smoking cessation counseling    Chronic Discussed smoking cessation we will plan on treating with Wellbutrin 150 mg tablet once a day x 3 days then increase to  Wellbutrin 150 mg twice daily. He was counseled on risk of seizures especially if he tries to abruptly stop drinking alcohol at the same time as starting Wellbutrin.  He was told not to do this and instead seek treatment with inpatient detox center.      Relevant Medications   buPROPion (WELLBUTRIN SR) 150 MG 12 hr tablet   Elevated liver enzymes    Incidental finding Will plan on rechecking metabolic panel at next office visit.      Relevant Orders   CBC   Comprehensive metabolic panel   Gamma GT   Vitamin B1   Prostate cancer screening    Labs ordered, further recommendations may be made based upon his results       Relevant Orders   PSA   Leukocytosis    Incidental finding Will plan to check CBC at next office visit      Relevant Orders   CBC   Alcoholism (HCC)    Chronic I agree that patient needs to stop drinking alcohol.  I did consult with backup supervising physician (Dr. Yetta Barre) who recommends patient be referred to inpatient detox due to how often and how much patient reports drinking and subsequent risk of DTs/seizure. Referral to social work made today and patient provided with phone number to [facility] behavioral health outpatient office as well as the [facility] to see if either of these can offer him inpatient detox options Additionally, I am planning to prescribe him Wellbutrin to assist with smoking cessation.  He denies any personal history of seizures.  However I did tell him that Wellbutrin can lower seizure threshold and he should avoid abrupt discontinuation of alcohol intake in addition to starting Wellbutrin due to high risk of possible seizure. He reports understanding.      Relevant Orders   Ambulatory referral to Social Work    Return in about 1 month (around 01/08/2023) for F/U with [name].  Total time spent on encounter today was 45 minutes including face-to-face evaluation, consultation with backup supervising physician, review of previous records,  and discussion/development of treatment plan.   [name], NP

## 2022-12-09 NOTE — Assessment & Plan Note (Signed)
Incidental finding Will plan on rechecking metabolic panel at next office visit.

## 2022-12-09 NOTE — Assessment & Plan Note (Signed)
Incidental finding Will plan to check CBC at next office visit

## 2022-12-09 NOTE — Assessment & Plan Note (Signed)
Referral to dermatology made today and refill of Kenalog cream prescribed.

## 2022-12-12 ENCOUNTER — Ambulatory Visit (INDEPENDENT_AMBULATORY_CARE_PROVIDER_SITE_OTHER): Payer: Medicaid Other | Admitting: Podiatry

## 2022-12-12 ENCOUNTER — Encounter: Payer: Self-pay | Admitting: Podiatry

## 2022-12-12 DIAGNOSIS — M79675 Pain in left toe(s): Secondary | ICD-10-CM

## 2022-12-12 DIAGNOSIS — M79674 Pain in right toe(s): Secondary | ICD-10-CM | POA: Diagnosis not present

## 2022-12-12 DIAGNOSIS — B351 Tinea unguium: Secondary | ICD-10-CM

## 2022-12-12 DIAGNOSIS — E1142 Type 2 diabetes mellitus with diabetic polyneuropathy: Secondary | ICD-10-CM

## 2022-12-12 DIAGNOSIS — Z419 Encounter for procedure for purposes other than remedying health state, unspecified: Secondary | ICD-10-CM | POA: Diagnosis not present

## 2022-12-12 DIAGNOSIS — Z932 Ileostomy status: Secondary | ICD-10-CM | POA: Diagnosis not present

## 2022-12-12 DIAGNOSIS — S31109A Unspecified open wound of abdominal wall, unspecified quadrant without penetration into peritoneal cavity, initial encounter: Secondary | ICD-10-CM | POA: Diagnosis not present

## 2022-12-12 NOTE — Progress Notes (Signed)
  Subjective:  Patient ID: Luis [name], male    DOB: [birth year],   MRN: [id number]  Chief Complaint  Patient presents with   Diabetes    Diabetic foot care / diabetic foot exam  Last A1C -8.0- 11/04/22    62 y.o. male presents for concern of thickened elongated and painful nails that are difficult to trim. Requesting to have them trimmed today. Relates burning and tingling in their feet. Patient is diabetic and last A1c was  Lab Results  Component Value Date   HGBA1C 8.0 (H) 11/04/2022   .   PCP:  Default, Provider, MD    . Denies any other pedal complaints. Denies n/v/f/c.   Past Medical History:  Diagnosis Date   Diabetes mellitus without complication (HCC)    Diverticulitis    DVT (deep venous thrombosis) (HCC)    x3   ETOH abuse    H/O colectomy    Hyperlipidemia    Hypertension    Smoker     Objective:  Physical Exam: Vascular: DP/PT pulses 2/4 bilateral. CFT <3 seconds. Absent hair growth on digits. Edema noted to bilateral lower extremities. Xerosis noted bilaterally.  Skin. No lacerations or abrasions bilateral feet. Nails 1-5 bilateral  are thickened discolored and elongated with subungual debris.  Musculoskeletal: MMT 5/5 bilateral lower extremities in DF, PF, Inversion and Eversion. Deceased ROM in DF of ankle joint.  Neurological: Sensation intact to light touch. Protective sensation diminished bilateral.    Assessment:   1. Pain due to onychomycosis of toenails of both feet   2. Type 2 diabetes mellitus with diabetic polyneuropathy, without long-term current use of insulin (HCC)      Plan:  Patient was evaluated and treated and all questions answered. -Discussed and educated patient on diabetic foot care, especially with  regards to the vascular, neurological and musculoskeletal systems.  -Stressed the importance of good glycemic control and the detriment of not  controlling glucose levels in relation to the foot. -Discussed supportive shoes at all  times and checking feet regularly.  -Mechanically debrided all nails 1-5 bilateral using sterile nail nipper and filed with dremel without incident  -DM shoe appointment  -Answered all patient questions -Patient to return  in 3 months for at risk foot care -Patient advised to call the office if any problems or questions arise in the meantime.   Louann Sjogren, DPM

## 2022-12-13 ENCOUNTER — Ambulatory Visit (HOSPITAL_COMMUNITY)
Admission: RE | Admit: 2022-12-13 | Discharge: 2022-12-13 | Disposition: A | Discharge status: Home / Self Care | Payer: Medicaid Other | Source: Ambulatory Visit | Attending: Nurse Practitioner | Admitting: Nurse Practitioner

## 2022-12-13 ENCOUNTER — Telehealth: Payer: Self-pay | Admitting: Nurse Practitioner

## 2022-12-13 DIAGNOSIS — L2489 Irritant contact dermatitis due to other agents: Secondary | ICD-10-CM | POA: Diagnosis not present

## 2022-12-13 DIAGNOSIS — K9412 Enterostomy infection: Secondary | ICD-10-CM | POA: Diagnosis not present

## 2022-12-13 DIAGNOSIS — K469 Unspecified abdominal hernia without obstruction or gangrene: Secondary | ICD-10-CM | POA: Diagnosis not present

## 2022-12-13 DIAGNOSIS — K9413 Enterostomy malfunction: Secondary | ICD-10-CM | POA: Diagnosis not present

## 2022-12-13 DIAGNOSIS — L24B3 Irritant contact dermatitis related to fecal or urinary stoma or fistula: Secondary | ICD-10-CM

## 2022-12-13 DIAGNOSIS — F17200 Nicotine dependence, unspecified, uncomplicated: Secondary | ICD-10-CM | POA: Insufficient documentation

## 2022-12-13 DIAGNOSIS — Z7689 Persons encountering health services in other specified circumstances: Secondary | ICD-10-CM | POA: Diagnosis not present

## 2022-12-13 NOTE — Progress Notes (Signed)
[facility]   Reason for visit:  RLQ ileostomy HPI:   Past Medical History:  Diagnosis Date   Diabetes mellitus without complication (HCC)    Diverticulitis    DVT (deep venous thrombosis) (HCC)    x3   ETOH abuse    H/O colectomy    Hyperlipidemia    Hypertension    Smoker    Family History  Problem Relation Age of Onset   COPD Mother    Prostate cancer Father    Colon cancer Neg Hx    Rectal cancer Neg Hx    Stomach cancer Neg Hx    Esophageal cancer Neg Hx    Allergies  Allergen Reactions   Lisinopril     Other reaction(s): renal effects   Codeine Other (See Comments) and Hives    unknown Other reaction(s): Other (See Comments) unknown unknown   Current Outpatient Medications  Medication Sig Dispense Refill Last Dose   acetaminophen (TYLENOL) 650 MG CR tablet Take 1 tablet (650 mg total) by mouth every 8 (eight) hours as needed for pain. 90 tablet 1    amLODipine (NORVASC) 10 MG tablet Take 10 mg by mouth daily.      atorvastatin (LIPITOR) 10 MG tablet Take 10 mg by mouth daily.      buPROPion (WELLBUTRIN SR) 150 MG 12 hr tablet Take 1 tablet by mouth once in the morning for 3 days, then take 1 tablet by mouth in the morning and the evening 60 tablet 2    cetirizine (ZYRTEC) 10 MG tablet Take 1 tablet (10 mg total) by mouth daily. 90 tablet 0    folic acid (FOLVITE) 1 MG tablet TAKE 1 TABLET(1 MG) BY MOUTH DAILY 90 tablet 3    metFORMIN (GLUCOPHAGE) 1000 MG tablet Take 0.5 tablets (500 mg total) by mouth 2 (two) times daily with a meal. 90 tablet 0    metoprolol succinate (TOPROL-XL) 50 MG 24 hr tablet TAKE 1 TABLET(50 MG) BY MOUTH DAILY 90 tablet 2    Multiple Vitamin (MULTIVITAMIN WITH MINERALS) TABS tablet Take 1 tablet by mouth every other day.       thiamine (VITAMIN B1) 100 MG tablet Take 1 tablet (100 mg total) by mouth daily. 90 tablet 0    traMADol (ULTRAM) 50 MG tablet Take 1 tablet (50 mg total) by mouth every 6 (six) hours as needed for  moderate pain. 30 tablet 0    triamcinolone cream (KENALOG) 0.5 % Apply to legs twice a day for up to two weeks as needed for eczema 30 g 1    No current facility-administered medications for this encounter.   ROS  Review of Systems  Constitutional:  Positive for fatigue.  Respiratory:  Positive for cough.        Smoker  Gastrointestinal:        RLQ ileostomy Abdominal hernia  Musculoskeletal:  Positive for gait problem.       Uses wheelchair  Psychiatric/Behavioral:         Is making efforts to stop smoking and drinking  All other systems reviewed and are negative.  Vital signs:  BP (!) 150/85 (BP Location: Right Arm)   Pulse 96   Temp 98.2 F (36.8 C) (Oral)   Resp 20   SpO2 93%  Exam:  Physical Exam Vitals reviewed.  Constitutional:      Appearance: He is obese.  Pulmonary:     Comments: Smoker (+) cough Abdominal:     Hernia: A  hernia is present.  Skin:    Findings: Lesion and rash present.  Neurological:     Mental Status: He is alert and oriented to person, place, and time.  Psychiatric:        Mood and Affect: Mood normal.        Behavior: Behavior normal.     Stoma type/location:  RLQ flush ileostomy Stomal assessment/size:  1"  Peristomal assessment:  abdominal hernia, nonhealing wound to abdomen Treatment options for stomal/peristomal skin: irritant contact dermatitis.  Now using as minimal tape as possible   Output: soft brown stool Ostomy pouching: 1pc. With barrier ring Education provided:  seen by surgery.  Awaiting CT  Skin is improving some with VASHE cleansing.  Continue aquacel     Impression/dx  Ileostomy Hernia Medical adhesive related skin injury  Nonhelaing wound Discussion  See back next week to assess wound healing and re-assess dermatitis  Plan  Back in one week    Visit time: 45 minutes.   Maple Hudson FNP-BC

## 2022-12-13 NOTE — Telephone Encounter (Signed)
Patient called and said he had spoke with Jiles Prows about needing a CT. He thought she was going to order one, but he hasn't heard anything. He would like a call back to see if she would still like to have one done. Best callback is [phone].

## 2022-12-19 ENCOUNTER — Ambulatory Visit (HOSPITAL_COMMUNITY): Payer: Medicaid Other | Admitting: Nurse Practitioner

## 2022-12-19 NOTE — Telephone Encounter (Signed)
Patient called back and said he gave Jiles Prows a packet from [facility] saying he needs a CT and a skin test for cancer. Patient would like a call back at [phone].

## 2022-12-20 ENCOUNTER — Ambulatory Visit (HOSPITAL_COMMUNITY): Payer: Medicaid Other | Admitting: Nurse Practitioner

## 2022-12-20 DIAGNOSIS — Z7689 Persons encountering health services in other specified circumstances: Secondary | ICD-10-CM | POA: Diagnosis not present

## 2022-12-22 NOTE — Telephone Encounter (Signed)
Called pt to ask about what packet was left at the office, but pt stated that he was suppose to see a dermatologist for screening for skin cancer and also was told he needed CT scan from [facility]. Base on Care Everywhere CT scan was already ordered by [facility] on the 12/06/22

## 2022-12-23 ENCOUNTER — Encounter (HOSPITAL_COMMUNITY): Payer: Self-pay

## 2022-12-23 ENCOUNTER — Inpatient Hospital Stay (HOSPITAL_COMMUNITY)
Admission: EM | Admit: 2022-12-23 | Discharge: 2023-01-01 | DRG: 871 | Disposition: A | Discharge status: Home / Self Care | Payer: Medicaid Other | Attending: Internal Medicine | Admitting: Internal Medicine

## 2022-12-23 ENCOUNTER — Other Ambulatory Visit: Payer: Self-pay

## 2022-12-23 ENCOUNTER — Emergency Department (HOSPITAL_COMMUNITY): Payer: Medicaid Other

## 2022-12-23 ENCOUNTER — Inpatient Hospital Stay (HOSPITAL_COMMUNITY): Payer: Medicaid Other

## 2022-12-23 ENCOUNTER — Ambulatory Visit (HOSPITAL_COMMUNITY): Payer: Medicaid Other | Admitting: Nurse Practitioner

## 2022-12-23 DIAGNOSIS — J9811 Atelectasis: Secondary | ICD-10-CM | POA: Diagnosis present

## 2022-12-23 DIAGNOSIS — F10931 Alcohol use, unspecified with withdrawal delirium: Secondary | ICD-10-CM

## 2022-12-23 DIAGNOSIS — E1165 Type 2 diabetes mellitus with hyperglycemia: Secondary | ICD-10-CM | POA: Diagnosis present

## 2022-12-23 DIAGNOSIS — J209 Acute bronchitis, unspecified: Secondary | ICD-10-CM | POA: Diagnosis present

## 2022-12-23 DIAGNOSIS — J441 Chronic obstructive pulmonary disease with (acute) exacerbation: Secondary | ICD-10-CM | POA: Diagnosis present

## 2022-12-23 DIAGNOSIS — I517 Cardiomegaly: Secondary | ICD-10-CM | POA: Diagnosis not present

## 2022-12-23 DIAGNOSIS — K435 Parastomal hernia without obstruction or  gangrene: Secondary | ICD-10-CM | POA: Diagnosis present

## 2022-12-23 DIAGNOSIS — E119 Type 2 diabetes mellitus without complications: Secondary | ICD-10-CM | POA: Insufficient documentation

## 2022-12-23 DIAGNOSIS — R0602 Shortness of breath: Secondary | ICD-10-CM | POA: Diagnosis not present

## 2022-12-23 DIAGNOSIS — J440 Chronic obstructive pulmonary disease with acute lower respiratory infection: Secondary | ICD-10-CM | POA: Diagnosis present

## 2022-12-23 DIAGNOSIS — J9601 Acute respiratory failure with hypoxia: Secondary | ICD-10-CM

## 2022-12-23 DIAGNOSIS — L259 Unspecified contact dermatitis, unspecified cause: Secondary | ICD-10-CM | POA: Diagnosis present

## 2022-12-23 DIAGNOSIS — E66811 Obesity, class 1: Secondary | ICD-10-CM

## 2022-12-23 DIAGNOSIS — I5033 Acute on chronic diastolic (congestive) heart failure: Secondary | ICD-10-CM | POA: Insufficient documentation

## 2022-12-23 DIAGNOSIS — Z888 Allergy status to other drugs, medicaments and biological substances status: Secondary | ICD-10-CM

## 2022-12-23 DIAGNOSIS — R918 Other nonspecific abnormal finding of lung field: Secondary | ICD-10-CM | POA: Diagnosis not present

## 2022-12-23 DIAGNOSIS — Z9049 Acquired absence of other specified parts of digestive tract: Secondary | ICD-10-CM

## 2022-12-23 DIAGNOSIS — N179 Acute kidney failure, unspecified: Secondary | ICD-10-CM | POA: Diagnosis not present

## 2022-12-23 DIAGNOSIS — J9602 Acute respiratory failure with hypercapnia: Secondary | ICD-10-CM | POA: Diagnosis present

## 2022-12-23 DIAGNOSIS — E872 Acidosis, unspecified: Secondary | ICD-10-CM | POA: Diagnosis present

## 2022-12-23 DIAGNOSIS — Z1152 Encounter for screening for COVID-19: Secondary | ICD-10-CM | POA: Diagnosis not present

## 2022-12-23 DIAGNOSIS — I509 Heart failure, unspecified: Secondary | ICD-10-CM

## 2022-12-23 DIAGNOSIS — R6520 Severe sepsis without septic shock: Secondary | ICD-10-CM | POA: Diagnosis present

## 2022-12-23 DIAGNOSIS — F17210 Nicotine dependence, cigarettes, uncomplicated: Secondary | ICD-10-CM | POA: Diagnosis present

## 2022-12-23 DIAGNOSIS — J189 Pneumonia, unspecified organism: Secondary | ICD-10-CM

## 2022-12-23 DIAGNOSIS — R1310 Dysphagia, unspecified: Secondary | ICD-10-CM | POA: Diagnosis present

## 2022-12-23 DIAGNOSIS — F32A Depression, unspecified: Secondary | ICD-10-CM | POA: Diagnosis present

## 2022-12-23 DIAGNOSIS — R21 Rash and other nonspecific skin eruption: Secondary | ICD-10-CM | POA: Diagnosis not present

## 2022-12-23 DIAGNOSIS — J811 Chronic pulmonary edema: Secondary | ICD-10-CM | POA: Diagnosis not present

## 2022-12-23 DIAGNOSIS — E1169 Type 2 diabetes mellitus with other specified complication: Secondary | ICD-10-CM | POA: Diagnosis present

## 2022-12-23 DIAGNOSIS — R062 Wheezing: Secondary | ICD-10-CM | POA: Diagnosis not present

## 2022-12-23 DIAGNOSIS — R609 Edema, unspecified: Secondary | ICD-10-CM | POA: Diagnosis not present

## 2022-12-23 DIAGNOSIS — I442 Atrioventricular block, complete: Secondary | ICD-10-CM | POA: Diagnosis not present

## 2022-12-23 DIAGNOSIS — J984 Other disorders of lung: Secondary | ICD-10-CM | POA: Diagnosis not present

## 2022-12-23 DIAGNOSIS — F10230 Alcohol dependence with withdrawal, uncomplicated: Secondary | ICD-10-CM | POA: Diagnosis present

## 2022-12-23 DIAGNOSIS — F1020 Alcohol dependence, uncomplicated: Secondary | ICD-10-CM | POA: Diagnosis not present

## 2022-12-23 DIAGNOSIS — Z950 Presence of cardiac pacemaker: Secondary | ICD-10-CM

## 2022-12-23 DIAGNOSIS — I10 Essential (primary) hypertension: Secondary | ICD-10-CM | POA: Diagnosis present

## 2022-12-23 DIAGNOSIS — A419 Sepsis, unspecified organism: Principal | ICD-10-CM | POA: Diagnosis present

## 2022-12-23 DIAGNOSIS — E669 Obesity, unspecified: Secondary | ICD-10-CM | POA: Diagnosis present

## 2022-12-23 DIAGNOSIS — G9340 Encephalopathy, unspecified: Secondary | ICD-10-CM

## 2022-12-23 DIAGNOSIS — E871 Hypo-osmolality and hyponatremia: Secondary | ICD-10-CM | POA: Diagnosis present

## 2022-12-23 DIAGNOSIS — R140 Abdominal distension (gaseous): Secondary | ICD-10-CM | POA: Diagnosis not present

## 2022-12-23 DIAGNOSIS — Z825 Family history of asthma and other chronic lower respiratory diseases: Secondary | ICD-10-CM

## 2022-12-23 DIAGNOSIS — E785 Hyperlipidemia, unspecified: Secondary | ICD-10-CM | POA: Diagnosis not present

## 2022-12-23 DIAGNOSIS — Z7984 Long term (current) use of oral hypoglycemic drugs: Secondary | ICD-10-CM

## 2022-12-23 DIAGNOSIS — I5031 Acute diastolic (congestive) heart failure: Secondary | ICD-10-CM | POA: Diagnosis not present

## 2022-12-23 DIAGNOSIS — Z79899 Other long term (current) drug therapy: Secondary | ICD-10-CM

## 2022-12-23 DIAGNOSIS — Z4682 Encounter for fitting and adjustment of non-vascular catheter: Secondary | ICD-10-CM | POA: Diagnosis not present

## 2022-12-23 DIAGNOSIS — I110 Hypertensive heart disease with heart failure: Secondary | ICD-10-CM | POA: Diagnosis present

## 2022-12-23 DIAGNOSIS — Z6833 Body mass index (BMI) 33.0-33.9, adult: Secondary | ICD-10-CM

## 2022-12-23 DIAGNOSIS — E875 Hyperkalemia: Secondary | ICD-10-CM | POA: Diagnosis present

## 2022-12-23 DIAGNOSIS — J810 Acute pulmonary edema: Secondary | ICD-10-CM

## 2022-12-23 DIAGNOSIS — M109 Gout, unspecified: Secondary | ICD-10-CM | POA: Diagnosis present

## 2022-12-23 DIAGNOSIS — R9431 Abnormal electrocardiogram [ECG] [EKG]: Secondary | ICD-10-CM | POA: Diagnosis not present

## 2022-12-23 DIAGNOSIS — Z885 Allergy status to narcotic agent status: Secondary | ICD-10-CM

## 2022-12-23 DIAGNOSIS — J90 Pleural effusion, not elsewhere classified: Secondary | ICD-10-CM | POA: Diagnosis not present

## 2022-12-23 DIAGNOSIS — R0902 Hypoxemia: Secondary | ICD-10-CM | POA: Diagnosis not present

## 2022-12-23 DIAGNOSIS — F10231 Alcohol dependence with withdrawal delirium: Secondary | ICD-10-CM | POA: Diagnosis not present

## 2022-12-23 DIAGNOSIS — F17200 Nicotine dependence, unspecified, uncomplicated: Secondary | ICD-10-CM | POA: Diagnosis present

## 2022-12-23 DIAGNOSIS — M79671 Pain in right foot: Secondary | ICD-10-CM | POA: Diagnosis not present

## 2022-12-23 DIAGNOSIS — L0390 Cellulitis, unspecified: Secondary | ICD-10-CM | POA: Diagnosis not present

## 2022-12-23 DIAGNOSIS — Z86718 Personal history of other venous thrombosis and embolism: Secondary | ICD-10-CM

## 2022-12-23 HISTORY — DX: Acute respiratory failure with hypoxia: J96.01

## 2022-12-23 LAB — BRAIN NATRIURETIC PEPTIDE: B Natriuretic Peptide: 293 pg/mL — ABNORMAL HIGH (ref 0.0–100.0)

## 2022-12-23 LAB — CBC WITH DIFFERENTIAL/PLATELET
Abs Immature Granulocytes: 0.08 10*3/uL — ABNORMAL HIGH (ref 0.00–0.07)
Basophils Absolute: 0.1 10*3/uL (ref 0.0–0.1)
Basophils Relative: 1 %
Eosinophils Absolute: 0.2 10*3/uL (ref 0.0–0.5)
Eosinophils Relative: 2 %
HCT: 38.2 % — ABNORMAL LOW (ref 39.0–52.0)
Hemoglobin: 12.7 g/dL — ABNORMAL LOW (ref 13.0–17.0)
Immature Granulocytes: 1 %
Lymphocytes Relative: 14 %
Lymphs Abs: 2 10*3/uL (ref 0.7–4.0)
MCH: 34.8 pg — ABNORMAL HIGH (ref 26.0–34.0)
MCHC: 33.2 g/dL (ref 30.0–36.0)
MCV: 104.7 fL — ABNORMAL HIGH (ref 80.0–100.0)
Monocytes Absolute: 1.2 10*3/uL — ABNORMAL HIGH (ref 0.1–1.0)
Monocytes Relative: 8 %
Neutro Abs: 10.7 10*3/uL — ABNORMAL HIGH (ref 1.7–7.7)
Neutrophils Relative %: 74 %
Platelets: 239 10*3/uL (ref 150–400)
RBC: 3.65 MIL/uL — ABNORMAL LOW (ref 4.22–5.81)
RDW: 13.8 % (ref 11.5–15.5)
WBC: 14.2 10*3/uL — ABNORMAL HIGH (ref 4.0–10.5)
nRBC: 0 % (ref 0.0–0.2)

## 2022-12-23 LAB — COMPREHENSIVE METABOLIC PANEL
ALT: 41 U/L (ref 0–44)
AST: 53 U/L — ABNORMAL HIGH (ref 15–41)
Albumin: 3.8 g/dL (ref 3.5–5.0)
Alkaline Phosphatase: 156 U/L — ABNORMAL HIGH (ref 38–126)
Anion gap: 13 (ref 5–15)
BUN: 17 mg/dL (ref 8–23)
CO2: 22 mmol/L (ref 22–32)
Calcium: 9.1 mg/dL (ref 8.9–10.3)
Chloride: 98 mmol/L (ref 98–111)
Creatinine, Ser: 0.82 mg/dL (ref 0.61–1.24)
GFR, Estimated: >60 mL/min (ref >60)
Glucose, Bld: 147 mg/dL — ABNORMAL HIGH (ref 70–99)
Potassium: 4.7 mmol/L (ref 3.5–5.1)
Sodium: 133 mmol/L — ABNORMAL LOW (ref 135–145)
Total Bilirubin: 0.5 mg/dL (ref 0.3–1.2)
Total Protein: 8.1 g/dL (ref 6.5–8.1)

## 2022-12-23 LAB — I-STAT VENOUS BLOOD GAS, ED
Acid-base deficit: 2 mmol/L (ref 0.0–2.0)
Bicarbonate: 23.8 mmol/L (ref 20.0–28.0)
Calcium, Ion: 1.06 mmol/L — ABNORMAL LOW (ref 1.15–1.40)
HCT: 39 % (ref 39.0–52.0)
Hemoglobin: 13.3 g/dL (ref 13.0–17.0)
O2 Saturation: 99 %
Potassium: 4.8 mmol/L (ref 3.5–5.1)
Sodium: 132 mmol/L — ABNORMAL LOW (ref 135–145)
TCO2: 25 mmol/L (ref 22–32)
pCO2, Ven: 42.9 mmHg — ABNORMAL LOW (ref 44–60)
pH, Ven: 7.353 (ref 7.25–7.43)
pO2, Ven: 135 mmHg — ABNORMAL HIGH (ref 32–45)

## 2022-12-23 LAB — TROPONIN I (HIGH SENSITIVITY)
Troponin I (High Sensitivity): 32 ng/L — ABNORMAL HIGH (ref <18)
Troponin I (High Sensitivity): 33 ng/L — ABNORMAL HIGH (ref <18)
Troponin I (High Sensitivity): 31 ng/L — ABNORMAL HIGH (ref <18)

## 2022-12-23 LAB — HIV ANTIBODY (ROUTINE TESTING W REFLEX): HIV Screen 4th Generation wRfx: NONREACTIVE

## 2022-12-23 LAB — SARS CORONAVIRUS 2 BY RT PCR: SARS Coronavirus 2 by RT PCR: NEGATIVE

## 2022-12-23 LAB — GLUCOSE, CAPILLARY: Glucose-Capillary: 259 mg/dL — ABNORMAL HIGH (ref 70–99)

## 2022-12-23 LAB — PROCALCITONIN: Procalcitonin: 0.23 ng/mL

## 2022-12-23 LAB — LACTIC ACID, PLASMA
Lactic Acid, Venous: 1.5 mmol/L (ref 0.5–1.9)
Lactic Acid, Venous: 1.4 mmol/L (ref 0.5–1.9)

## 2022-12-23 MED ORDER — FOLIC ACID 1 MG PO TABS
1.0000 mg | ORAL_TABLET | Freq: Every day | ORAL | Status: DC
  Administered 2022-12-23 – 2022-12-25 (×3): 1 mg via ORAL
  Filled 2022-12-23 (×3): qty 1

## 2022-12-23 MED ORDER — THIAMINE MONONITRATE 100 MG PO TABS
100.0000 mg | ORAL_TABLET | Freq: Every day | ORAL | Status: DC
  Administered 2022-12-24: 100 mg via ORAL
  Filled 2022-12-23: qty 1

## 2022-12-23 MED ORDER — METHYLPREDNISOLONE SODIUM SUCC 40 MG IJ SOLR
40.0000 mg | Freq: Two times a day (BID) | INTRAMUSCULAR | Status: AC
  Administered 2022-12-23 – 2022-12-24 (×2): 40 mg via INTRAVENOUS
  Filled 2022-12-23 (×2): qty 1

## 2022-12-23 MED ORDER — FOLIC ACID 1 MG PO TABS: 1.0000 mg | ORAL_TABLET | Freq: Every day | ORAL | Status: DC

## 2022-12-23 MED ORDER — METFORMIN HCL 500 MG PO TABS
500.0000 mg | ORAL_TABLET | Freq: Two times a day (BID) | ORAL | Status: DC
  Filled 2022-12-23: qty 1

## 2022-12-23 MED ORDER — SODIUM CHLORIDE 0.9 % IV SOLN: 2.0000 g | INTRAVENOUS | Status: DC

## 2022-12-23 MED ORDER — METOPROLOL TARTRATE 25 MG PO TABS
25.0000 mg | ORAL_TABLET | Freq: Two times a day (BID) | ORAL | Status: DC
  Administered 2022-12-23 – 2022-12-25 (×4): 25 mg via ORAL
  Filled 2022-12-23 (×4): qty 1

## 2022-12-23 MED ORDER — IPRATROPIUM-ALBUTEROL 0.5-2.5 (3) MG/3ML IN SOLN
3.0000 mL | Freq: Four times a day (QID) | RESPIRATORY_TRACT | Status: DC
  Administered 2022-12-23: 3 mL via RESPIRATORY_TRACT
  Filled 2022-12-23: qty 3

## 2022-12-23 MED ORDER — LABETALOL HCL 5 MG/ML IV SOLN: 10.0000 mg | INTRAVENOUS | Status: DC | PRN

## 2022-12-23 MED ORDER — FUROSEMIDE 10 MG/ML IJ SOLN
40.0000 mg | Freq: Once | INTRAMUSCULAR | Status: AC
  Administered 2022-12-23: 40 mg via INTRAVENOUS
  Filled 2022-12-23: qty 4

## 2022-12-23 MED ORDER — AMLODIPINE BESYLATE 10 MG PO TABS
10.0000 mg | ORAL_TABLET | Freq: Every day | ORAL | Status: DC
  Administered 2022-12-23 – 2022-12-24 (×2): 10 mg via ORAL
  Filled 2022-12-23 (×2): qty 1

## 2022-12-23 MED ORDER — AZITHROMYCIN 250 MG PO TABS: 500.0000 mg | ORAL_TABLET | Freq: Every day | ORAL | Status: DC

## 2022-12-23 MED ORDER — ATORVASTATIN CALCIUM 10 MG PO TABS
10.0000 mg | ORAL_TABLET | Freq: Every day | ORAL | Status: DC
  Administered 2022-12-23 – 2022-12-25 (×3): 10 mg via ORAL
  Filled 2022-12-23 (×3): qty 1

## 2022-12-23 MED ORDER — FUROSEMIDE 10 MG/ML IJ SOLN
40.0000 mg | Freq: Two times a day (BID) | INTRAMUSCULAR | Status: DC
  Administered 2022-12-23 – 2022-12-24 (×2): 40 mg via INTRAVENOUS
  Filled 2022-12-23 (×2): qty 4

## 2022-12-23 MED ORDER — PREDNISONE 20 MG PO TABS: 40.0000 mg | ORAL_TABLET | Freq: Every day | ORAL | Status: DC

## 2022-12-23 MED ORDER — LORATADINE 10 MG PO TABS
10.0000 mg | ORAL_TABLET | Freq: Every day | ORAL | Status: DC
  Administered 2022-12-23 – 2022-12-25 (×3): 10 mg via ORAL
  Filled 2022-12-23 (×3): qty 1

## 2022-12-23 MED ORDER — THIAMINE HCL 100 MG/ML IJ SOLN
100.0000 mg | Freq: Every day | INTRAMUSCULAR | Status: DC
  Administered 2022-12-23 – 2022-12-27 (×4): 100 mg via INTRAVENOUS
  Filled 2022-12-23 (×4): qty 2

## 2022-12-23 MED ORDER — LORAZEPAM 1 MG PO TABS
1.0000 mg | ORAL_TABLET | ORAL | Status: DC | PRN
  Administered 2022-12-24 (×2): 1 mg via ORAL
  Administered 2022-12-24: 2 mg via ORAL
  Administered 2022-12-25: 1 mg via ORAL
  Filled 2022-12-23 (×6): qty 1
  Filled 2022-12-23: qty 2

## 2022-12-23 MED ORDER — THIAMINE HCL 100 MG PO TABS: 100.0000 mg | ORAL_TABLET | Freq: Every day | ORAL | Status: DC

## 2022-12-23 MED ORDER — ENOXAPARIN SODIUM 60 MG/0.6ML IJ SOSY
50.0000 mg | PREFILLED_SYRINGE | INTRAMUSCULAR | Status: DC
  Administered 2022-12-24 – 2023-01-01 (×9): 50 mg via SUBCUTANEOUS
  Filled 2022-12-23 (×9): qty 0.6

## 2022-12-23 MED ORDER — MOMETASONE FURO-FORMOTEROL FUM 200-5 MCG/ACT IN AERO
2.0000 | INHALATION_SPRAY | Freq: Two times a day (BID) | RESPIRATORY_TRACT | Status: DC
  Administered 2022-12-24: 2 via RESPIRATORY_TRACT
  Filled 2022-12-23: qty 8.8

## 2022-12-23 MED ORDER — ADULT MULTIVITAMIN W/MINERALS CH
1.0000 | ORAL_TABLET | Freq: Every day | ORAL | Status: DC
  Administered 2022-12-23 – 2022-12-25 (×3): 1 via ORAL
  Filled 2022-12-23 (×3): qty 1

## 2022-12-23 MED ORDER — IPRATROPIUM-ALBUTEROL 0.5-2.5 (3) MG/3ML IN SOLN
3.0000 mL | Freq: Three times a day (TID) | RESPIRATORY_TRACT | Status: DC
  Administered 2022-12-24 – 2022-12-26 (×7): 3 mL via RESPIRATORY_TRACT
  Filled 2022-12-23 (×6): qty 3

## 2022-12-23 MED ORDER — DOXYCYCLINE HYCLATE 100 MG PO TABS
100.0000 mg | ORAL_TABLET | Freq: Two times a day (BID) | ORAL | Status: DC
  Administered 2022-12-23 – 2022-12-24 (×2): 100 mg via ORAL
  Filled 2022-12-23 (×2): qty 1

## 2022-12-23 MED ORDER — LORAZEPAM 2 MG/ML IJ SOLN
1.0000 mg | INTRAMUSCULAR | Status: DC | PRN
  Administered 2022-12-23 – 2022-12-25 (×4): 2 mg via INTRAVENOUS
  Administered 2022-12-25: 1 mg via INTRAVENOUS
  Administered 2022-12-25: 2 mg via INTRAVENOUS
  Filled 2022-12-23 (×6): qty 1

## 2022-12-23 MED ORDER — INSULIN ASPART 100 UNIT/ML IJ SOLN
0.0000 [IU] | Freq: Three times a day (TID) | INTRAMUSCULAR | Status: DC
  Administered 2022-12-24: 8 [IU] via SUBCUTANEOUS
  Administered 2022-12-24: 11 [IU] via SUBCUTANEOUS
  Administered 2022-12-25 (×2): 5 [IU] via SUBCUTANEOUS

## 2022-12-23 MED ORDER — BUDESONIDE 0.5 MG/2ML IN SUSP
2.0000 mg | Freq: Two times a day (BID) | RESPIRATORY_TRACT | Status: DC
  Administered 2022-12-23 – 2022-12-25 (×5): 2 mg via RESPIRATORY_TRACT
  Filled 2022-12-23 (×5): qty 8

## 2022-12-23 MED ORDER — ALBUTEROL SULFATE (2.5 MG/3ML) 0.083% IN NEBU: 2.5000 mg | INHALATION_SOLUTION | RESPIRATORY_TRACT | Status: DC | PRN

## 2022-12-23 MED ORDER — ACETAMINOPHEN 325 MG PO TABS: 650.0000 mg | ORAL_TABLET | Freq: Three times a day (TID) | ORAL | Status: DC | PRN

## 2022-12-23 MED ORDER — BUPROPION HCL ER (SR) 150 MG PO TB12
150.0000 mg | ORAL_TABLET | Freq: Two times a day (BID) | ORAL | Status: DC
  Administered 2022-12-23 – 2022-12-25 (×4): 150 mg via ORAL
  Filled 2022-12-23 (×5): qty 1

## 2022-12-23 NOTE — ED Triage Notes (Signed)
Pt BIB EMS due to resp distress. SHOB all day, room air 57%. Pt has cellulitis and a colostomy bag. Pt arrives on cpap. EMS gave solumedrol, mag, and 2 duonebs. Axox4.

## 2022-12-23 NOTE — Progress Notes (Signed)
Patient taken off Bipap with MD at bedside. Patient placed on Imboden 6L, sats dropped to mid 80's, patient has increased WOB. Patient placed back on Bipap support, sats recovered to 91%. RN notified.

## 2022-12-23 NOTE — ED Notes (Signed)
ED TO INPATIENT HANDOFF REPORT  ED Nurse Name and Phone #: [phone], Tori   S Name/Age/Gender Luis [name] 62 y.o. male Room/Bed: 016C/016C  Code Status   Code Status: Full Code  Home/SNF/Other Home Patient oriented to: self, place, time, and situation Is this baseline? Yes   Triage Complete: Triage complete  Chief Complaint CHF (congestive heart failure) (HCC) [I50.9]  Triage Note Pt BIB EMS due to resp distress. SHOB all day, room air 57%. Pt has cellulitis and a colostomy bag. Pt arrives on cpap. EMS gave solumedrol, mag, and 2 duonebs. Axox4.    Allergies Allergies  Allergen Reactions   Lisinopril     Other reaction(s): renal effects   Codeine Other (See Comments) and Hives    unknown Other reaction(s): Other (See Comments) unknown unknown    Level of Care/Admitting Diagnosis ED Disposition     ED Disposition  Admit   Condition  --   Comment  Hospital Area: [facility] [[id number]]  Level of Care: Progressive [102]  Admit to Progressive based on following criteria: RESPIRATORY PROBLEMS hypoxemic/hypercapnic respiratory failure that is responsive to NIPPV (BiPAP) or High Flow Nasal Cannula (6-80 lpm). Frequent assessment/intervention, no > Q2 hrs < Q4 hrs, to maintain oxygenation and pulmonary hygiene.  May admit patient to [facility] or [facility] if equivalent level of care is available:: No  Covid Evaluation: Asymptomatic - no recent exposure (last 10 days) testing not required  Diagnosis: CHF (congestive heart failure) [facility]) [[id number]]  Admitting Physician: Emeline General [[id number]]  Attending Physician: Emeline General [[id number]]  Certification:: I certify this patient will need inpatient services for at least 2 midnights  Estimated Length of Stay: 2          B Medical/Surgery History Past Medical History:  Diagnosis Date   Diabetes mellitus without complication (HCC)    Diverticulitis    DVT (deep venous thrombosis) (HCC)    x3   ETOH  abuse    H/O colectomy    Hyperlipidemia    Hypertension    Smoker    Past Surgical History:  Procedure Laterality Date   COLOSTOMY     PACEMAKER LEADLESS INSERTION N/A 12/31/2020   Procedure: PACEMAKER LEADLESS INSERTION;  Surgeon: [name], MD;  Location: [facility] INVASIVE CV LAB;  Service: Cardiovascular;  Laterality: N/A;   TONSILLECTOMY       A IV Location/Drains/Wounds Patient Lines/Drains/Airways Status     Active Line/Drains/Airways     Name Placement date Placement time Site Days   Peripheral IV 12/23/22 22 G Anterior;Left Hand 12/23/22  1318  Hand  less than 1   Colostomy RLQ --  --  RLQ  --            Intake/Output Last 24 hours No intake or output data in the 24 hours ending 12/23/22 1537  Labs/Imaging Results for orders placed or performed during the hospital encounter of 12/23/22 (from the past 48 hour(s))  Comprehensive metabolic panel     Status: Abnormal   Collection Time: 12/23/22  1:25 PM  Result Value Ref Range   Sodium 133 (L) 135 - 145 mmol/L   Potassium 4.7 3.5 - 5.1 mmol/L   Chloride 98 98 - 111 mmol/L   CO2 22 22 - 32 mmol/L   Glucose, Bld 147 (H) 70 - 99 mg/dL    Comment: Glucose reference range applies only to samples taken after fasting for at least 8 hours.   BUN 17 8 - 23  mg/dL   Creatinine, Ser 1.61 0.61 - 1.24 mg/dL   Calcium 9.1 8.9 - 09.6 mg/dL   Total Protein 8.1 6.5 - 8.1 g/dL   Albumin 3.8 3.5 - 5.0 g/dL   AST 53 (H) 15 - 41 U/L   ALT 41 0 - 44 U/L   Alkaline Phosphatase 156 (H) 38 - 126 U/L   Total Bilirubin 0.5 0.3 - 1.2 mg/dL   GFR, Estimated >04 >54 mL/min    Comment: (NOTE) Calculated using the CKD-EPI Creatinine Equation (2021)    Anion gap 13 5 - 15    Comment: Performed at [facility], [address] [place], [place] [address]  Brain natriuretic peptide     Status: Abnormal   Collection Time: 12/23/22  1:25 PM  Result Value Ref Range   B Natriuretic Peptide 293.0 (H) 0.0 - 100.0 pg/mL    Comment:  Performed at [facility], [address], [place], [place] [address]  Troponin I (High Sensitivity)     Status: Abnormal   Collection Time: 12/23/22  1:25 PM  Result Value Ref Range   Troponin I (High Sensitivity) 32 (H) <18 ng/L    Comment: (NOTE) Elevated high sensitivity troponin I (hsTnI) values and significant  changes across serial measurements may suggest ACS but many other  chronic and acute conditions are known to elevate hsTnI results.  Refer to the "Links" section for chest pain algorithms and additional  guidance. Performed at [facility], [address] [place], [place] [address]   CBC with Differential     Status: Abnormal   Collection Time: 12/23/22  1:25 PM  Result Value Ref Range   WBC 14.2 (H) 4.0 - 10.5 K/uL   RBC 3.65 (L) 4.22 - 5.81 MIL/uL   Hemoglobin 12.7 (L) 13.0 - 17.0 g/dL   HCT 13.0 (L) 86.5 - 78.4 %   MCV 104.7 (H) 80.0 - 100.0 fL   MCH 34.8 (H) 26.0 - 34.0 pg   MCHC 33.2 30.0 - 36.0 g/dL   RDW 69.6 29.5 - 28.4 %   Platelets 239 150 - 400 K/uL   nRBC 0.0 0.0 - 0.2 %   Neutrophils Relative % 74 %   Neutro Abs 10.7 (H) 1.7 - 7.7 K/uL   Lymphocytes Relative 14 %   Lymphs Abs 2.0 0.7 - 4.0 K/uL   Monocytes Relative 8 %   Monocytes Absolute 1.2 (H) 0.1 - 1.0 K/uL   Eosinophils Relative 2 %   Eosinophils Absolute 0.2 0.0 - 0.5 K/uL   Basophils Relative 1 %   Basophils Absolute 0.1 0.0 - 0.1 K/uL   Immature Granulocytes 1 %   Abs Immature Granulocytes 0.08 (H) 0.00 - 0.07 K/uL    Comment: Performed at [facility], [address], [place], [place] [address]  I-Stat venous blood gas, ED     Status: Abnormal   Collection Time: 12/23/22  2:14 PM  Result Value Ref Range   pH, Ven 7.353 7.25 - 7.43   pCO2, Ven 42.9 (L) 44 - 60 mmHg   pO2, Ven 135 (H) 32 - 45 mmHg   Bicarbonate 23.8 20.0 - 28.0 mmol/L   TCO2 25 22 - 32 mmol/L   O2 Saturation 99 %   Acid-base deficit 2.0 0.0 - 2.0 mmol/L   Sodium 132 (L) 135 - 145 mmol/L    Potassium 4.8 3.5 - 5.1 mmol/L   Calcium, Ion 1.06 (L) 1.15 - 1.40 mmol/L  HCT 39.0 39.0 - 52.0 %   Hemoglobin 13.3 13.0 - 17.0 g/dL   Sample type VENOUS    DG Chest Portable 1 View  Result Date: 12/23/2022 CLINICAL DATA:  SOB EXAM: PORTABLE CHEST 1 VIEW COMPARISON:  CXR 01/01/21 FINDINGS: No pleural effusion. No pneumothorax. Cardiomegaly. There are prominent bilateral interstitial opacities that could represent mild pulmonary edema or atypical infection. No radiographically apparent displaced rib fractures. Visualized upper abdomen is unremarkable. IMPRESSION: Cardiomegaly with mild pulmonary edema. Electronically Signed   By: Lorenza Cambridge M.D.   On: 12/23/2022 13:50    Pending Labs Unresulted Labs (From admission, onward)     Start     Ordered   12/24/22 0500  CBC  Tomorrow morning,   R        12/23/22 1536   12/24/22 0500  Basic metabolic panel  Tomorrow morning,   R        12/23/22 1536   12/23/22 1534  HIV Antibody (routine testing w rflx)  (HIV Antibody (Routine testing w reflex) panel)  Once,   R        12/23/22 1536   12/23/22 1526  Procalcitonin  Once,   URGENT       References:    Procalcitonin Lower Respiratory Tract Infection AND Sepsis Procalcitonin Algorithm   12/23/22 1525   12/23/22 1322  SARS Coronavirus 2 by RT PCR (hospital order, performed in [facility] hospital lab) *cepheid single result test* Anterior Nasal Swab  (SARS Coronavirus 2 by RT PCR (hospital order, performed in [facility] hospital lab) *cepheid single result test*)  Once,   URGENT        12/23/22 1322            Vitals/Pain Today's Vitals   12/23/22 1400 12/23/22 1415 12/23/22 1430 12/23/22 1515  BP: 115/79 115/82 (!) 144/87 130/85  Pulse: 89 92 92 94  Resp: 10 15 15 15   Temp:      TempSrc:      SpO2: 95% 93% 92% 91%  Weight:      Height:      PainSc:        Isolation Precautions Airborne and Contact precautions  Medications Medications  ipratropium-albuterol (DUONEB) 0.5-2.5  (3) MG/3ML nebulizer solution 3 mL (3 mLs Nebulization Given 12/23/22 1325)  acetaminophen (TYLENOL) CR tablet 650 mg (has no administration in time range)  amLODipine (NORVASC) tablet 10 mg (has no administration in time range)  atorvastatin (LIPITOR) tablet 10 mg (has no administration in time range)  metoprolol tartrate (LOPRESSOR) tablet 25 mg (has no administration in time range)  buPROPion (WELLBUTRIN SR) 12 hr tablet 150 mg (has no administration in time range)  metFORMIN (GLUCOPHAGE) tablet 500 mg (has no administration in time range)  folic acid (FOLVITE) tablet 1 mg (has no administration in time range)  thiamine (VITAMIN B1) tablet 100 mg (has no administration in time range)  loratadine (CLARITIN) tablet 10 mg (has no administration in time range)  budesonide (PULMICORT) nebulizer solution 2 mg (has no administration in time range)  methylPREDNISolone sodium succinate (SOLU-MEDROL) 40 mg/mL injection 40 mg (has no administration in time range)    Followed by  predniSONE (DELTASONE) tablet 40 mg (has no administration in time range)  mometasone-formoterol (DULERA) 200-5 MCG/ACT inhaler 2 puff (has no administration in time range)  ipratropium-albuterol (DUONEB) 0.5-2.5 (3) MG/3ML nebulizer solution 3 mL (has no administration in time range)  albuterol (PROVENTIL) (2.5 MG/3ML) 0.083% nebulizer solution 2.5 mg (has no administration in  time range)  enoxaparin (LOVENOX) injection 40 mg (has no administration in time range)  insulin aspart (novoLOG) injection 0-15 Units (has no administration in time range)  LORazepam (ATIVAN) tablet 1-4 mg (has no administration in time range)    Or  LORazepam (ATIVAN) injection 1-4 mg (has no administration in time range)  thiamine (VITAMIN B1) tablet 100 mg (has no administration in time range)    Or  thiamine (VITAMIN B1) injection 100 mg (has no administration in time range)  folic acid (FOLVITE) tablet 1 mg (has no administration in time range)   multivitamin with minerals tablet 1 tablet (has no administration in time range)  furosemide (LASIX) injection 40 mg (40 mg Intravenous Given 12/23/22 1429)    Mobility walks     Focused Assessments Cardiac Assessment Handoff:    Lab Results  Component Value Date   CKTOTAL 48 (L) 12/28/2020   Lab Results  Component Value Date   DDIMER 2.02 (H) 12/25/2020   Does the Patient currently have chest pain? No    R Recommendations: See Admitting Provider Note  Report given to:   Additional Notes: axox4, VSS.

## 2022-12-23 NOTE — Progress Notes (Addendum)
CT chest reviewed.  Main finding appears to compatible with cardiomegaly and CHF decompensation with bilateral pleural effusion.  Will start Lasix 40 mg p.o. twice daily, patient already received the first of IV Lasix earlier this afternoon in the ED.  Echo ordered  There is also significant bronchoaerogram indicating pneumonia, procalcitonin level 0.2, de-escalate antibiotics to doxycycline p.o. twice daily

## 2022-12-23 NOTE — Progress Notes (Signed)
   12/23/22 2037  BiPAP/CPAP/SIPAP  BiPAP/CPAP/SIPAP Pt Type Adult  BiPAP/CPAP/SIPAP V60  Mask Type Full face mask  Mask Size Large  Set Rate 10 breaths/min  Respiratory Rate 35 breaths/min  IPAP 16 cmH20  EPAP 8 cmH2O  FiO2 (%) (S)  80 %  Minute Ventilation 22.9  Leak 46  Peak Inspiratory Pressure (PIP) 16  Tidal Volume (Vt) 650  Patient Home Equipment No  Auto Titrate No  Press High Alarm 25 cmH2O  Press Low Alarm 5 cmH2O

## 2022-12-23 NOTE — H&P (Addendum)
History and Physical    Jaylinn Curby YNW:[id number] DOB: [birth year] DOA: 12/23/2022  PCP: [name], NP (Confirm with patient/family/NH records and if not entered, this has to be entered at [facility] point of entry) Patient coming from: Home  I have personally briefly reviewed patient's old medical records in [facility]  Chief Complaint: Cough, wheezing SOB  HPI: Luis [name] is a 62 y.o. male with medical history significant of third-degree AV block status post PPM, history of colectomy with chronic abdominal wall wound, HTN, HLD, IIDM, alcohol abuse, presented with sudden onset of cough wheezing shortness of breath.  Patient is a poor historian.  He developed productive cough of clear-whitish phlegm 2 to 3 days ago, gradually became worse and patient stayed awake whole night last night, shortness of breath and called EMS this morning for shortness of breath.  EMS arrived and found patient O2 saturation in the 50s and started patient on CPAP.  En route to hospital patient was given IV Solu-Medrol and magnesium and DuoNebs.  At baseline patient is a heavy drinker drinks every day but denies any recent nauseous vomiting or LOC, he lives alone.   ED Course: Afebrile, borderline tachycardia nonhypotensive O2 saturation 91% on BiPAP with 40% O2.  Chest x-ray showed mild pulmonary congestion and cardiomegaly.  Blood work showed WBC 14.2, creatinine 0.8 K4.7 bicarb 22.  ABG 7.35/42/135  Patient was given IV Lasix 40 mg in the ED  Review of Systems: As per HPI otherwise 14 point review of systems negative.    Past Medical History:  Diagnosis Date   Diabetes mellitus without complication (HCC)    Diverticulitis    DVT (deep venous thrombosis) (HCC)    x3   ETOH abuse    H/O colectomy    Hyperlipidemia    Hypertension    Smoker     Past Surgical History:  Procedure Laterality Date   COLOSTOMY     PACEMAKER LEADLESS INSERTION N/A 12/31/2020   Procedure: PACEMAKER LEADLESS  INSERTION;  Surgeon: [name], MD;  Location: [facility] INVASIVE CV LAB;  Service: Cardiovascular;  Laterality: N/A;   TONSILLECTOMY       reports that he has been smoking cigarettes. He has a 44 pack-year smoking history. He uses smokeless tobacco. He reports current alcohol use. He reports current drug use. Drug: Marijuana.  Allergies  Allergen Reactions   Lisinopril     Other reaction(s): renal effects   Codeine Other (See Comments) and Hives    unknown Other reaction(s): Other (See Comments) unknown unknown    Family History  Problem Relation Age of Onset   COPD Mother    Prostate cancer Father    Colon cancer Neg Hx    Rectal cancer Neg Hx    Stomach cancer Neg Hx    Esophageal cancer Neg Hx      Prior to Admission medications   Medication Sig Start Date End Date Taking? Authorizing Provider  acetaminophen (TYLENOL) 650 MG CR tablet Take 1 tablet (650 mg total) by mouth every 8 (eight) hours as needed for pain. 11/10/22  Yes [name], NP  amLODipine (NORVASC) 10 MG tablet Take 10 mg by mouth daily.   Yes [provider]  atorvastatin (LIPITOR) 10 MG tablet Take 10 mg by mouth daily. 11/20/20  Yes [provider]  buPROPion (WELLBUTRIN SR) 150 MG 12 hr tablet Take 1 tablet by mouth once in the morning for 3 days, then take 1 tablet by mouth in  the morning and the evening 12/09/22  Yes [name], NP  cetirizine (ZYRTEC) 10 MG tablet Take 1 tablet (10 mg total) by mouth daily. 11/10/22  Yes [name], NP  folic acid (FOLVITE) 1 MG tablet TAKE 1 TABLET(1 MG) BY MOUTH DAILY 01/07/22  Yes Graciella Freer, PA-C  metFORMIN (GLUCOPHAGE) 1000 MG tablet Take 0.5 tablets (500 mg total) by mouth 2 (two) times daily with a meal. Patient taking differently: Take 1,000 mg by mouth daily. 12/09/22  Yes [name], NP  metoprolol succinate (TOPROL-XL) 50 MG 24 hr tablet TAKE 1 TABLET(50 MG) BY MOUTH DAILY 02/28/22  Yes Sheilah Pigeon, PA-C  Multiple  Vitamin (MULTIVITAMIN WITH MINERALS) TABS tablet Take 1 tablet by mouth every other day.    Yes [provider]  thiamine (VITAMIN B1) 100 MG tablet Take 1 tablet (100 mg total) by mouth daily. 11/10/22  Yes [name], NP  triamcinolone cream (KENALOG) 0.5 % Apply to legs twice a day for up to two weeks as needed for eczema 12/09/22  Yes [name], NP  traMADol (ULTRAM) 50 MG tablet Take 1 tablet (50 mg total) by mouth every 6 (six) hours as needed for moderate pain. Patient not taking: Reported on 12/23/2022 08/04/19   Burnadette Pop, MD    Physical Exam: Vitals:   12/23/22 1400 12/23/22 1415 12/23/22 1430 12/23/22 1515  BP: 115/79 115/82 (!) 144/87 130/85  Pulse: 89 92 92 94  Resp: 10 15 15 15   Temp:      TempSrc:      SpO2: 95% 93% 92% 91%  Weight:      Height:        Constitutional: NAD, calm, comfortable Vitals:   12/23/22 1400 12/23/22 1415 12/23/22 1430 12/23/22 1515  BP: 115/79 115/82 (!) 144/87 130/85  Pulse: 89 92 92 94  Resp: 10 15 15 15   Temp:      TempSrc:      SpO2: 95% 93% 92% 91%  Weight:      Height:       Eyes: PERRL, lids and conjunctivae normal ENMT: Mucous membranes are moist. Posterior pharynx clear of any exudate or lesions.Normal dentition.  Neck: normal, supple, no masses, no thyromegaly Respiratory: Diminished breathing sound bilaterally, diffused wheezing and scattered crackles bilaterally, increasing breathing effort, positive signs of accessory muscle use.  Cardiovascular: Regular rate and rhythm, no murmurs / rubs / gallops. No extremity edema. 2+ pedal pulses. No carotid bruits.  Abdomen: no tenderness, no masses palpated. No hepatosplenomegaly. Bowel sounds positive.  Musculoskeletal: no clubbing / cyanosis. No joint deformity upper and lower extremities. Good ROM, no contractures. Normal muscle tone.  Skin: Large area of macular rash with flaky appearance around abdominal wall centered in umbilicus, with thin clear straw-colored  discharge from several skin openings but nontender and not warm to touch  neurologic: CN 2-12 grossly intact. Sensation intact, DTR normal. Strength 5/5 in all 4.  Psychiatric: Normal judgment and insight. Alert and oriented x 3. Normal mood.     Labs on Admission: I have personally reviewed following labs and imaging studies  CBC: Recent Labs  Lab 12/23/22 1325 12/23/22 1414  WBC 14.2*  --   NEUTROABS 10.7*  --   HGB 12.7* 13.3  HCT 38.2* 39.0  MCV 104.7*  --   PLT 239  --    Basic Metabolic Panel: Recent Labs  Lab 12/23/22 1325 12/23/22 1414  NA 133* 132*  K 4.7 4.8  CL 98  --   CO2 22  --   GLUCOSE 147*  --   BUN 17  --   CREATININE 0.82  --   CALCIUM 9.1  --    GFR: Estimated Creatinine Clearance: 120.2 mL/min (by C-G formula based on SCr of 0.82 mg/dL). Liver Function Tests: Recent Labs  Lab 12/23/22 1325  AST 53*  ALT 41  ALKPHOS 156*  BILITOT 0.5  PROT 8.1  ALBUMIN 3.8   No results for input(s): "LIPASE", "AMYLASE" in the last 168 hours. No results for input(s): "AMMONIA" in the last 168 hours. Coagulation Profile: No results for input(s): "INR", "PROTIME" in the last 168 hours. Cardiac Enzymes: No results for input(s): "CKTOTAL", "CKMB", "CKMBINDEX", "TROPONINI" in the last 168 hours. BNP (last 3 results) No results for input(s): "PROBNP" in the last 8760 hours. HbA1C: No results for input(s): "HGBA1C" in the last 72 hours. CBG: No results for input(s): "GLUCAP" in the last 168 hours. Lipid Profile: No results for input(s): "CHOL", "HDL", "LDLCALC", "TRIG", "CHOLHDL", "LDLDIRECT" in the last 72 hours. Thyroid Function Tests: No results for input(s): "TSH", "T4TOTAL", "FREET4", "T3FREE", "THYROIDAB" in the last 72 hours. Anemia Panel: No results for input(s): "VITAMINB12", "FOLATE", "FERRITIN", "TIBC", "IRON", "RETICCTPCT" in the last 72 hours. Urine analysis:    Component Value Date/Time   COLORURINE YELLOW 08/02/2019 2009   APPEARANCEUR  CLEAR 08/02/2019 2009   LABSPEC 1.024 08/02/2019 2009   PHURINE 5.0 08/02/2019 2009   GLUCOSEU NEGATIVE 08/02/2019 2009   HGBUR NEGATIVE 08/02/2019 2009   BILIRUBINUR NEGATIVE 08/02/2019 2009   KETONESUR NEGATIVE 08/02/2019 2009   PROTEINUR NEGATIVE 08/02/2019 2009   NITRITE NEGATIVE 08/02/2019 2009   LEUKOCYTESUR NEGATIVE 08/02/2019 2009    Radiological Exams on Admission: DG Chest Portable 1 View  Result Date: 12/23/2022 CLINICAL DATA:  SOB EXAM: PORTABLE CHEST 1 VIEW COMPARISON:  CXR 01/01/21 FINDINGS: No pleural effusion. No pneumothorax. Cardiomegaly. There are prominent bilateral interstitial opacities that could represent mild pulmonary edema or atypical infection. No radiographically apparent displaced rib fractures. Visualized upper abdomen is unremarkable. IMPRESSION: Cardiomegaly with mild pulmonary edema. Electronically Signed   By: Lorenza Cambridge M.D.   On: 12/23/2022 13:50    EKG: Independently reviewed. V paced (reported as LBBB)  Assessment/Plan Principal Problem:   CHF (congestive heart failure) (HCC) Active Problems:   Alcohol dependence with uncomplicated withdrawal (HCC)   Tobacco dependence   Acute respiratory failure with hypoxia (HCC)  (please populate well all problems here in Problem List. (For example, if patient is on BP meds at home and you resume or decide to hold them, it is a problem that needs to be her. Same for CAD, COPD, HLD and so on)  Acute hypoxic respiratory failure -Aspiration pneumonia/atypical pneumonia versus new onset of CHF -CT chest without contrast to further characterize -Cover CAP/atypical pneumonia with ceftriaxone and azithromycin -For possible new onset of CHF, so far evidenced more by image study and possible cardiac wheezing on auscultation, patient received 1 dose of IV Lasix, clinically patient appears to be euvolemic.  Blood pressure fairly controlled and most recent echo 6 months ago showed normal LVEF but unclear about diastolic  function.  Hold off further IV Lasix today, repeat chest x-ray tomorrow to decide further diuresis plan. -Admitted to PCU and continue BiPAP tonight  SIRS -As above -Lactic acid sent  Leukocytosis -Suspected pneumonia, management as above  Acute bronchitis -No formal diagnosis of COPD, clinically this can be either cardiac asthma or reaction from aspiration pneumonia.  Management as above. -Trial of IV Solu-Medrol bridging for prednisone tomorrow -ICS and LABA -DuoNebs and as needed albuterol  Chronic abdominal wall dermatitis -Chronic dermatitis-like changes, no signs of cellulitis at this point -Consult wound care  Alcohol abuse -No symptoms signs of active withdrawal at this point, start patient on CIWA protocol with as needed benzos  IIDM -Continue metformin -Add sliding scale  HTN -Fairly controlled, continue amlodipine  DVT prophylaxis: Lovenox Code Status: Full code Family Communication: None at bedside Disposition Plan: Patient is sick with acute hypoxic respiratory failure requiring BiPAP and suspect pneumonia versus new onset of CHF, requiring further inpatient workup, expect more than 2 midnight hospital stay. Consults called: None Admission status: PCU admit   Emeline General MD [facility] Pager [phone]  12/23/2022, 4:05 PM

## 2022-12-23 NOTE — ED Provider Notes (Signed)
Emergency Department Provider Note   I have reviewed the triage vital signs and the nursing notes.   HISTORY  Chief Complaint Respiratory Distress   HPI Luis [name] is a 62 y.o. male presents to the ED with respiratory distress. Arrives by EMS who found patient to have hypoxemia to 57% of RA. Started on CPAP, Mag, Duonebs. Patient denies CP. No fever.   Level 5 caveat: respiratory distress.   Past Medical History:  Diagnosis Date   Diabetes mellitus without complication (HCC)    Diverticulitis    DVT (deep venous thrombosis) (HCC)    x3   ETOH abuse    H/O colectomy    Hyperlipidemia    Hypertension    Smoker     Review of Systems  Level 5 caveat: respiratory distress.   ____________________________________________   PHYSICAL EXAM:  VITAL SIGNS: Temp: 97.7 F BP: 157/89 SpO2: 95% BiPAP Pulse: 78  Constitutional: Alert and oriented. Increased WOB but tolerating BiPAP well.  Eyes: Conjunctivae are normal. Head: Atraumatic. Nose: No congestion/rhinnorhea. Mouth/Throat: Mucous membranes are moist.  Neck: No stridor.   Cardiovascular: Normal rate, regular rhythm. Good peripheral circulation. Grossly normal heart sounds.   Respiratory: Increased respiratory effort.  No retractions. Lungs with crackles at the bases.  Gastrointestinal: Soft and nontender. No distention.  Musculoskeletal: No lower extremity tenderness nor edema. No gross deformities of extremities. Neurologic:  Normal speech and language. No gross focal neurologic deficits are appreciated.  Skin:  Skin is warm, dry and intact. No rash noted.  ____________________________________________   LABS (all labs ordered are listed, but only abnormal results are displayed)  Labs Reviewed  COMPREHENSIVE METABOLIC PANEL - Abnormal; Notable for the following components:      Result Value   Sodium 133 (*)    Glucose, Bld 147 (*)    AST 53 (*)    Alkaline Phosphatase 156 (*)    All other components  within normal limits  BRAIN NATRIURETIC PEPTIDE - Abnormal; Notable for the following components:   B Natriuretic Peptide 293.0 (*)    All other components within normal limits  CBC WITH DIFFERENTIAL/PLATELET - Abnormal; Notable for the following components:   WBC 14.2 (*)    RBC 3.65 (*)    Hemoglobin 12.7 (*)    HCT 38.2 (*)    MCV 104.7 (*)    MCH 34.8 (*)    Neutro Abs 10.7 (*)    Monocytes Absolute 1.2 (*)    Abs Immature Granulocytes 0.08 (*)    All other components within normal limits  CBC - Abnormal; Notable for the following components:   WBC 12.6 (*)    RBC 3.72 (*)    MCV 106.7 (*)    MCH 34.9 (*)    All other components within normal limits  GLUCOSE, CAPILLARY - Abnormal; Notable for the following components:   Glucose-Capillary 259 (*)    All other components within normal limits  GLUCOSE, CAPILLARY - Abnormal; Notable for the following components:   Glucose-Capillary 270 (*)    All other components within normal limits  BASIC METABOLIC PANEL - Abnormal; Notable for the following components:   Sodium 127 (*)    Potassium 7.3 (*)    Chloride 96 (*)    CO2 20 (*)    Glucose, Bld 294 (*)    BUN 33 (*)    Creatinine, Ser 1.64 (*)    Calcium 8.5 (*)    GFR, Estimated 47 (*)    All other components  within normal limits  GLUCOSE, CAPILLARY - Abnormal; Notable for the following components:   Glucose-Capillary 309 (*)    All other components within normal limits  GLUCOSE, CAPILLARY - Abnormal; Notable for the following components:   Glucose-Capillary 285 (*)    All other components within normal limits  BASIC METABOLIC PANEL - Abnormal; Notable for the following components:   Sodium 128 (*)    Potassium 6.6 (*)    Chloride 95 (*)    Glucose, Bld 301 (*)    BUN 35 (*)    Creatinine, Ser 1.77 (*)    Calcium 8.7 (*)    GFR, Estimated 43 (*)    All other components within normal limits  BASIC METABOLIC PANEL - Abnormal; Notable for the following components:    Sodium 128 (*)    Potassium 5.5 (*)    Chloride 93 (*)    Glucose, Bld 342 (*)    BUN 47 (*)    Creatinine, Ser 1.94 (*)    GFR, Estimated 38 (*)    All other components within normal limits  GLUCOSE, CAPILLARY - Abnormal; Notable for the following components:   Glucose-Capillary 276 (*)    All other components within normal limits  GLUCOSE, CAPILLARY - Abnormal; Notable for the following components:   Glucose-Capillary 305 (*)    All other components within normal limits  BASIC METABOLIC PANEL - Abnormal; Notable for the following components:   Sodium 129 (*)    Potassium 5.7 (*)    Chloride 97 (*)    Glucose, Bld 261 (*)    BUN 50 (*)    Creatinine, Ser 1.86 (*)    GFR, Estimated 40 (*)    All other components within normal limits  CBC WITH DIFFERENTIAL/PLATELET - Abnormal; Notable for the following components:   WBC 17.8 (*)    RBC 3.56 (*)    Hemoglobin 12.1 (*)    HCT 37.7 (*)    MCV 105.9 (*)    Neutro Abs 16.0 (*)    Lymphs Abs 0.5 (*)    Monocytes Absolute 1.1 (*)    Abs Immature Granulocytes 0.15 (*)    All other components within normal limits  GLUCOSE, CAPILLARY - Abnormal; Notable for the following components:   Glucose-Capillary 300 (*)    All other components within normal limits  GLUCOSE, CAPILLARY - Abnormal; Notable for the following components:   Glucose-Capillary 234 (*)    All other components within normal limits  BLOOD GAS, ARTERIAL - Abnormal; Notable for the following components:   pH, Arterial 7.28 (*)    pCO2 arterial 56 (*)    pO2, Arterial 65 (*)    All other components within normal limits  GLUCOSE, CAPILLARY - Abnormal; Notable for the following components:   Glucose-Capillary 246 (*)    All other components within normal limits  LACTIC ACID, PLASMA - Abnormal; Notable for the following components:   Lactic Acid, Venous 2.0 (*)    All other components within normal limits  D-DIMER, QUANTITATIVE - Abnormal; Notable for the following  components:   D-Dimer, Quant 0.65 (*)    All other components within normal limits  BRAIN NATRIURETIC PEPTIDE - Abnormal; Notable for the following components:   B Natriuretic Peptide 1,428.6 (*)    All other components within normal limits  CBC - Abnormal; Notable for the following components:   WBC 10.9 (*)    RBC 3.13 (*)    Hemoglobin 10.9 (*)    HCT 33.9 (*)  MCV 108.3 (*)    MCH 34.8 (*)    All other components within normal limits  BASIC METABOLIC PANEL - Abnormal; Notable for the following components:   Glucose, Bld 163 (*)    BUN 54 (*)    Creatinine, Ser 1.41 (*)    GFR, Estimated 56 (*)    All other components within normal limits  LACTIC ACID, PLASMA - Abnormal; Notable for the following components:   Lactic Acid, Venous 2.0 (*)    All other components within normal limits  GLUCOSE, CAPILLARY - Abnormal; Notable for the following components:   Glucose-Capillary 286 (*)    All other components within normal limits  GLUCOSE, CAPILLARY - Abnormal; Notable for the following components:   Glucose-Capillary 232 (*)    All other components within normal limits  GLUCOSE, CAPILLARY - Abnormal; Notable for the following components:   Glucose-Capillary 181 (*)    All other components within normal limits  TRIGLYCERIDES - Abnormal; Notable for the following components:   Triglycerides 522 (*)    All other components within normal limits  GLUCOSE, CAPILLARY - Abnormal; Notable for the following components:   Glucose-Capillary 169 (*)    All other components within normal limits  GLUCOSE, CAPILLARY - Abnormal; Notable for the following components:   Glucose-Capillary 190 (*)    All other components within normal limits  GLUCOSE, CAPILLARY - Abnormal; Notable for the following components:   Glucose-Capillary 230 (*)    All other components within normal limits  CBC - Abnormal; Notable for the following components:   WBC 11.6 (*)    RBC 3.81 (*)    MCV 105.8 (*)    MCH  35.4 (*)    All other components within normal limits  BASIC METABOLIC PANEL - Abnormal; Notable for the following components:   Glucose, Bld 199 (*)    BUN 39 (*)    All other components within normal limits  GLUCOSE, CAPILLARY - Abnormal; Notable for the following components:   Glucose-Capillary 209 (*)    All other components within normal limits  GLUCOSE, CAPILLARY - Abnormal; Notable for the following components:   Glucose-Capillary 222 (*)    All other components within normal limits  GLUCOSE, CAPILLARY - Abnormal; Notable for the following components:   Glucose-Capillary 191 (*)    All other components within normal limits  GLUCOSE, CAPILLARY - Abnormal; Notable for the following components:   Glucose-Capillary 215 (*)    All other components within normal limits  GLUCOSE, CAPILLARY - Abnormal; Notable for the following components:   Glucose-Capillary 137 (*)    All other components within normal limits  GLUCOSE, CAPILLARY - Abnormal; Notable for the following components:   Glucose-Capillary 110 (*)    All other components within normal limits  CBC - Abnormal; Notable for the following components:   WBC 15.9 (*)    MCV 106.4 (*)    MCH 35.5 (*)    All other components within normal limits  COMPREHENSIVE METABOLIC PANEL - Abnormal; Notable for the following components:   Glucose, Bld 122 (*)    BUN 31 (*)    Albumin 3.4 (*)    AST 87 (*)    ALT 93 (*)    All other components within normal limits  GLUCOSE, CAPILLARY - Abnormal; Notable for the following components:   Glucose-Capillary 149 (*)    All other components within normal limits  GLUCOSE, CAPILLARY - Abnormal; Notable for the following components:   Glucose-Capillary 117 (*)  All other components within normal limits  GLUCOSE, CAPILLARY - Abnormal; Notable for the following components:   Glucose-Capillary 120 (*)    All other components within normal limits  GLUCOSE, CAPILLARY - Abnormal; Notable for the  following components:   Glucose-Capillary 125 (*)    All other components within normal limits  I-STAT VENOUS BLOOD GAS, ED - Abnormal; Notable for the following components:   pCO2, Ven 42.9 (*)    pO2, Ven 135 (*)    Sodium 132 (*)    Calcium, Ion 1.06 (*)    All other components within normal limits  POCT I-STAT 7, (LYTES, BLD GAS, ICA,H+H) - Abnormal; Notable for the following components:   pH, Arterial 7.197 (*)    pCO2 arterial 72.0 (*)    Bicarbonate 28.1 (*)    Sodium 134 (*)    Potassium 5.5 (*)    All other components within normal limits  POCT I-STAT 7, (LYTES, BLD GAS, ICA,H+H) - Abnormal; Notable for the following components:   pO2, Arterial 129 (*)    Sodium 133 (*)    HCT 32.0 (*)    Hemoglobin 10.9 (*)    All other components within normal limits  TROPONIN I (HIGH SENSITIVITY) - Abnormal; Notable for the following components:   Troponin I (High Sensitivity) 32 (*)    All other components within normal limits  TROPONIN I (HIGH SENSITIVITY) - Abnormal; Notable for the following components:   Troponin I (High Sensitivity) 33 (*)    All other components within normal limits  TROPONIN I (HIGH SENSITIVITY) - Abnormal; Notable for the following components:   Troponin I (High Sensitivity) 31 (*)    All other components within normal limits  SARS CORONAVIRUS 2 BY RT PCR  MRSA NEXT GEN BY PCR, NASAL  CULTURE, RESPIRATORY W GRAM STAIN  PROCALCITONIN  HIV ANTIBODY (ROUTINE TESTING W REFLEX)  MYCOPLASMA PNEUMONIAE ANTIBODY, IGM  LACTIC ACID, PLASMA  LACTIC ACID, PLASMA  LACTIC ACID, PLASMA  PROCALCITONIN  PROCALCITONIN  LACTIC ACID, PLASMA  MAGNESIUM  PHOSPHORUS  AMMONIA  LACTIC ACID, PLASMA  C-REACTIVE PROTEIN  MAGNESIUM  PHOSPHORUS  PROCALCITONIN  MAGNESIUM  PHOSPHORUS  MAGNESIUM  PHOSPHORUS  MAGNESIUM  PHOSPHORUS  PROCALCITONIN  STREP PNEUMONIAE URINARY ANTIGEN  LEGIONELLA PNEUMOPHILA SEROGP 1 UR AG  VITAMIN A  ZINC  VITAMIN C    ____________________________________________  EKG   EKG Interpretation Date/Time:  Friday December 23 2022 13:31:49 EDT Ventricular Rate:  91 PR Interval:  228 QRS Duration:  167 QT Interval:  421 QTC Calculation: 518 R Axis:   -66  Text Interpretation: V paced rhythm Prolonged PR interval Probable left atrial enlargement Left bundle branch block Confirmed by Vivi Barrack [phone]) on 12/24/2022 8:38:32 PM        ____________________________________________   PROCEDURES  Procedure(s) performed:   Procedures  CRITICAL CARE Performed by: Maia Plan Total critical care time: 35 minutes Critical care time was exclusive of separately billable procedures and treating other patients. Critical care was necessary to treat or prevent imminent or life-threatening deterioration. Critical care was time spent personally by me on the following activities: development of treatment plan with patient and/or surrogate as well as nursing, discussions with consultants, evaluation of patient's response to treatment, examination of patient, obtaining history from patient or surrogate, ordering and performing treatments and interventions, ordering and review of laboratory studies, ordering and review of radiographic studies, pulse oximetry and re-evaluation of patient's condition.  Alona Bene, MD Emergency Medicine  ____________________________________________  INITIAL IMPRESSION / ASSESSMENT AND PLAN / ED COURSE  Pertinent labs & imaging results that were available during my care of the patient were reviewed by me and considered in my medical decision making (see chart for details).   This patient is Presenting for Evaluation of SOB, which does require a range of treatment options, and is a complaint that involves a high risk of morbidity and mortality.  The Differential Diagnoses includes but is not exclusive to acute coronary syndrome, aortic dissection, pulmonary embolism, cardiac  tamponade, community-acquired pneumonia, pericarditis, musculoskeletal chest wall pain, etc.   Critical Interventions-    Medications  albuterol (PROVENTIL) (2.5 MG/3ML) 0.083% nebulizer solution 2.5 mg (has no administration in time range)  enoxaparin (LOVENOX) injection 50 mg (50 mg Subcutaneous Given 12/28/22 1059)  Ampicillin-Sulbactam (UNASYN) 3 g in sodium chloride 0.9 % 100 mL IVPB (0 g Intravenous Stopped 12/28/22 0423)  perflutren lipid microspheres (DEFINITY) IV suspension (4 mLs Intravenous Given 12/24/22 1451)  arformoterol (BROVANA) nebulizer solution 15 mcg (15 mcg Nebulization Given 12/28/22 0744)  furosemide (LASIX) injection 40 mg (40 mg Intravenous Given 12/28/22 1059)  azithromycin (ZITHROMAX) 500 mg in sodium chloride 0.9 % 250 mL IVPB (500 mg Intravenous New Bag/Given 12/28/22 1109)  insulin aspart (novoLOG) injection 0-20 Units (3 Units Subcutaneous Given 12/28/22 0902)  Chlorhexidine Gluconate Cloth 2 % PADS 6 each (6 each Topical Given 12/28/22 0936)  0.9 %  sodium chloride infusion ( Intravenous Stopped 12/27/22 1211)  succinylcholine (ANECTINE) 200 MG/10ML syringe (  Not Given 12/25/22 1612)  etomidate (AMIDATE) 2 MG/ML injection (  Not Given 12/25/22 1612)  famotidine (PEPCID) tablet 20 mg (20 mg Per Tube Given 12/28/22 1059)  fentaNYL (SUBLIMAZE) injection 50 mcg (50 mcg Intravenous Not Given 12/25/22 1902)  budesonide (PULMICORT) nebulizer solution 0.5 mg (0.5 mg Nebulization Given by Other 12/28/22 0741)  Oral care mouth rinse (15 mLs Mouth Rinse Given 12/28/22 1000)  Oral care mouth rinse (has no administration in time range)  midazolam (VERSED) injection 1-2 mg (1 mg Intravenous Given 12/28/22 0625)  PHENObarbital (LUMINAL) 260 mg in sodium chloride 0.9 % 100 mL IVPB (0 mg Intravenous Paused 12/26/22 1424)    Followed by  PHENObarbital (LUMINAL) injection 130 mg (130 mg Intravenous Given 12/27/22 1501)    Followed by  PHENObarbital (LUMINAL) injection 65 mg (65 mg  Intravenous Given 12/28/22 1059)    Followed by  PHENObarbital (LUMINAL) injection 65 mg (has no administration in time range)  hydrALAZINE (APRESOLINE) injection 10 mg (has no administration in time range)  acetaminophen (TYLENOL) tablet 650 mg (650 mg Per NG tube Given 12/27/22 1039)  insulin glargine-yfgn (SEMGLEE) injection 18 Units (18 Units Subcutaneous Given 12/28/22 1059)  haloperidol lactate (HALDOL) injection 5 mg (5 mg Intravenous Given 12/28/22 0903)  LORazepam (ATIVAN) tablet 1-4 mg ( Oral See Alternative 12/28/22 0818)    Or  LORazepam (ATIVAN) injection 1-4 mg (2 mg Intravenous Given by Other 12/28/22 0818)  multivitamin with minerals tablet 1 tablet (1 tablet Oral Given 12/28/22 1059)  feeding supplement (ENSURE ENLIVE / ENSURE PLUS) liquid 237 mL (237 mLs Oral Given 12/28/22 1120)  Oral care mouth rinse (15 mLs Mouth Rinse Given 12/28/22 0817)  Oral care mouth rinse (has no administration in time range)  nicotine (NICODERM CQ - dosed in mg/24 hours) patch 21 mg (21 mg Transdermal Patch Applied 12/28/22 0051)  amLODipine (NORVASC) tablet 10 mg (10 mg Oral Given 12/28/22 1059)  cloNIDine (CATAPRES) tablet 0.2 mg (0.2 mg Oral Given 12/28/22 1103)  ascorbic acid (VITAMIN C) tablet 500 mg (has no administration in time range)  atorvastatin (LIPITOR) tablet 10 mg (has no administration in time range)  docusate sodium (COLACE) capsule 100 mg (100 mg Oral Not Given 12/28/22 1120)  folic acid (FOLVITE) tablet 1 mg (has no administration in time range)  polyethylene glycol (MIRALAX / GLYCOLAX) packet 17 g (has no administration in time range)  zinc sulfate capsule 220 mg (has no administration in time range)  thiamine (VITAMIN B1) tablet 100 mg (100 mg Oral Given 12/28/22 1104)  furosemide (LASIX) injection 40 mg (40 mg Intravenous Given 12/23/22 1429)  methylPREDNISolone sodium succinate (SOLU-MEDROL) 40 mg/mL injection 40 mg (40 mg Intravenous Given 12/24/22 0543)  dextrose 50 % solution 50 mL (50  mLs Intravenous Given 12/24/22 1338)  insulin aspart (novoLOG) injection 10 Units (10 Units Intravenous Given 12/24/22 1338)  calcium gluconate 1 g/ 50 mL sodium chloride IVPB (0 mg Intravenous Stopped 12/24/22 1403)  sodium zirconium cyclosilicate (LOKELMA) packet 10 g (10 g Oral Given 12/25/22 1047)  ketamine 50 mg in normal saline 5 mL (10 mg/mL) syringe (50 mg Intravenous Given 12/25/22 1609)  rocuronium bromide 100 MG/10ML SOSY (100 mg  Given 12/25/22 1611)  midazolam (VERSED) injection 2 mg (2 mg Intravenous Given 12/25/22 1602)  LORazepam (ATIVAN) injection 2 mg (2 mg Intravenous Given 12/27/22 0911)  potassium chloride (KLOR-CON) packet 20 mEq (20 mEq Oral Given 12/28/22 1059)    Reassessment after intervention:  symptoms improved.    I did obtain Additional Historical Information from EMS.   Clinical Laboratory Tests Ordered, included CBC with mild leukocytosis. COVID negative.   Radiologic Tests Ordered, included CXR. I independently interpreted the images and agree with radiology interpretation.   Cardiac Monitor Tracing which shows NSR.    Social Determinants of Health Risk patient is a smoker.   Consult complete with [facility]. Plan for admit.   Medical Decision Making: Summary:  Patient arrives to the ED in respiratory distress. Tolerating BiPAP well here with improved O2 and WOB. Plan to continue treatment and admit.    Patient's presentation is most consistent with acute presentation with potential threat to life or bodily function.   Disposition: admit  ____________________________________________  FINAL CLINICAL IMPRESSION(S) / ED DIAGNOSES  Final diagnoses:  Acute respiratory failure with hypoxia (HCC)     NEW OUTPATIENT MEDICATIONS STARTED DURING THIS VISIT:  Current Discharge Medication List     START taking these medications   Details  folic acid (FOLVITE) 1 MG tablet TAKE 1 TABLET(1 MG) BY MOUTH DAILY Qty: 90 tablet, Refills: 0        Note:  This  document was prepared using Dragon voice recognition software and may include unintentional dictation errors.  Alona Bene, MD, [facility] Emergency Medicine    Jamelyn Bovard, Arlyss Repress, MD 12/28/22 1136

## 2022-12-23 NOTE — Progress Notes (Signed)
RT assisted with patient transport from ED to CT. Following CT, patient was transported to 3E25 without complications.

## 2022-12-24 ENCOUNTER — Inpatient Hospital Stay (HOSPITAL_COMMUNITY): Payer: Medicaid Other

## 2022-12-24 DIAGNOSIS — N179 Acute kidney failure, unspecified: Secondary | ICD-10-CM

## 2022-12-24 DIAGNOSIS — I517 Cardiomegaly: Secondary | ICD-10-CM | POA: Diagnosis not present

## 2022-12-24 DIAGNOSIS — I10 Essential (primary) hypertension: Secondary | ICD-10-CM | POA: Diagnosis not present

## 2022-12-24 DIAGNOSIS — I5033 Acute on chronic diastolic (congestive) heart failure: Secondary | ICD-10-CM | POA: Diagnosis not present

## 2022-12-24 DIAGNOSIS — E669 Obesity, unspecified: Secondary | ICD-10-CM

## 2022-12-24 DIAGNOSIS — R21 Rash and other nonspecific skin eruption: Secondary | ICD-10-CM | POA: Diagnosis not present

## 2022-12-24 DIAGNOSIS — J189 Pneumonia, unspecified organism: Secondary | ICD-10-CM

## 2022-12-24 DIAGNOSIS — I5031 Acute diastolic (congestive) heart failure: Secondary | ICD-10-CM

## 2022-12-24 DIAGNOSIS — F1020 Alcohol dependence, uncomplicated: Secondary | ICD-10-CM

## 2022-12-24 DIAGNOSIS — E1169 Type 2 diabetes mellitus with other specified complication: Secondary | ICD-10-CM | POA: Diagnosis not present

## 2022-12-24 DIAGNOSIS — R918 Other nonspecific abnormal finding of lung field: Secondary | ICD-10-CM | POA: Diagnosis not present

## 2022-12-24 DIAGNOSIS — J90 Pleural effusion, not elsewhere classified: Secondary | ICD-10-CM | POA: Diagnosis not present

## 2022-12-24 DIAGNOSIS — Z86718 Personal history of other venous thrombosis and embolism: Secondary | ICD-10-CM | POA: Diagnosis not present

## 2022-12-24 DIAGNOSIS — E785 Hyperlipidemia, unspecified: Secondary | ICD-10-CM | POA: Diagnosis not present

## 2022-12-24 DIAGNOSIS — J811 Chronic pulmonary edema: Secondary | ICD-10-CM | POA: Diagnosis not present

## 2022-12-24 LAB — CBC
HCT: 39.7 % (ref 39.0–52.0)
Hemoglobin: 13 g/dL (ref 13.0–17.0)
MCH: 34.9 pg — ABNORMAL HIGH (ref 26.0–34.0)
MCHC: 32.7 g/dL (ref 30.0–36.0)
MCV: 106.7 fL — ABNORMAL HIGH (ref 80.0–100.0)
Platelets: 265 10*3/uL (ref 150–400)
RBC: 3.72 MIL/uL — ABNORMAL LOW (ref 4.22–5.81)
RDW: 13.8 % (ref 11.5–15.5)
WBC: 12.6 10*3/uL — ABNORMAL HIGH (ref 4.0–10.5)
nRBC: 0 % (ref 0.0–0.2)

## 2022-12-24 LAB — BASIC METABOLIC PANEL
Anion gap: 11 (ref 5–15)
Anion gap: 10 (ref 5–15)
Anion gap: 12 (ref 5–15)
BUN: 33 mg/dL — ABNORMAL HIGH (ref 8–23)
BUN: 35 mg/dL — ABNORMAL HIGH (ref 8–23)
BUN: 47 mg/dL — ABNORMAL HIGH (ref 8–23)
CO2: 20 mmol/L — ABNORMAL LOW (ref 22–32)
CO2: 23 mmol/L (ref 22–32)
CO2: 23 mmol/L (ref 22–32)
Calcium: 8.5 mg/dL — ABNORMAL LOW (ref 8.9–10.3)
Calcium: 8.7 mg/dL — ABNORMAL LOW (ref 8.9–10.3)
Calcium: 9.2 mg/dL (ref 8.9–10.3)
Chloride: 96 mmol/L — ABNORMAL LOW (ref 98–111)
Chloride: 95 mmol/L — ABNORMAL LOW (ref 98–111)
Chloride: 93 mmol/L — ABNORMAL LOW (ref 98–111)
Creatinine, Ser: 1.64 mg/dL — ABNORMAL HIGH (ref 0.61–1.24)
Creatinine, Ser: 1.77 mg/dL — ABNORMAL HIGH (ref 0.61–1.24)
Creatinine, Ser: 1.94 mg/dL — ABNORMAL HIGH (ref 0.61–1.24)
GFR, Estimated: 47 mL/min — ABNORMAL LOW (ref >60)
GFR, Estimated: 43 mL/min — ABNORMAL LOW (ref >60)
GFR, Estimated: 38 mL/min — ABNORMAL LOW (ref >60)
Glucose, Bld: 294 mg/dL — ABNORMAL HIGH (ref 70–99)
Glucose, Bld: 301 mg/dL — ABNORMAL HIGH (ref 70–99)
Glucose, Bld: 342 mg/dL — ABNORMAL HIGH (ref 70–99)
Potassium: 7.3 mmol/L (ref 3.5–5.1)
Potassium: 6.6 mmol/L (ref 3.5–5.1)
Potassium: 5.5 mmol/L — ABNORMAL HIGH (ref 3.5–5.1)
Sodium: 127 mmol/L — ABNORMAL LOW (ref 135–145)
Sodium: 128 mmol/L — ABNORMAL LOW (ref 135–145)
Sodium: 128 mmol/L — ABNORMAL LOW (ref 135–145)

## 2022-12-24 LAB — GLUCOSE, CAPILLARY
Glucose-Capillary: 270 mg/dL — ABNORMAL HIGH (ref 70–99)
Glucose-Capillary: 276 mg/dL — ABNORMAL HIGH (ref 70–99)
Glucose-Capillary: 285 mg/dL — ABNORMAL HIGH (ref 70–99)
Glucose-Capillary: 300 mg/dL — ABNORMAL HIGH (ref 70–99)
Glucose-Capillary: 305 mg/dL — ABNORMAL HIGH (ref 70–99)
Glucose-Capillary: 309 mg/dL — ABNORMAL HIGH (ref 70–99)

## 2022-12-24 LAB — ECHOCARDIOGRAM COMPLETE: Height: 73 in

## 2022-12-24 MED ORDER — SODIUM ZIRCONIUM CYCLOSILICATE 10 G PO PACK
10.0000 g | PACK | Freq: Three times a day (TID) | ORAL | Status: AC
  Administered 2022-12-24 – 2022-12-25 (×3): 10 g via ORAL
  Filled 2022-12-24 (×3): qty 1

## 2022-12-24 MED ORDER — INSULIN ASPART 100 UNIT/ML IJ SOLN
0.0000 [IU] | Freq: Every day | INTRAMUSCULAR | Status: DC
  Administered 2022-12-24: 3 [IU] via SUBCUTANEOUS

## 2022-12-24 MED ORDER — METHYLPREDNISOLONE SODIUM SUCC 40 MG IJ SOLR
40.0000 mg | Freq: Every day | INTRAMUSCULAR | Status: DC
  Administered 2022-12-24 – 2022-12-26 (×3): 40 mg via INTRAVENOUS
  Filled 2022-12-24 (×3): qty 1

## 2022-12-24 MED ORDER — DEXTROSE 50 % IV SOLN
1.0000 | Freq: Once | INTRAVENOUS | Status: AC
  Administered 2022-12-24: 50 mL via INTRAVENOUS
  Filled 2022-12-24: qty 50

## 2022-12-24 MED ORDER — SODIUM CHLORIDE 0.9 % IV SOLN: 1.5000 g | Freq: Four times a day (QID) | INTRAVENOUS | Status: DC

## 2022-12-24 MED ORDER — INSULIN ASPART 100 UNIT/ML IV SOLN
10.0000 [IU] | Freq: Once | INTRAVENOUS | Status: AC
  Administered 2022-12-24: 10 [IU] via INTRAVENOUS

## 2022-12-24 MED ORDER — SODIUM CHLORIDE 0.9 % IV SOLN
3.0000 g | Freq: Four times a day (QID) | INTRAVENOUS | Status: DC
  Administered 2022-12-24 – 2022-12-31 (×28): 3 g via INTRAVENOUS
  Filled 2022-12-24 (×29): qty 8

## 2022-12-24 MED ORDER — PERFLUTREN LIPID MICROSPHERE
1.0000 mL | INTRAVENOUS | Status: AC | PRN
  Administered 2022-12-24: 4 mL via INTRAVENOUS

## 2022-12-24 MED ORDER — CALCIUM GLUCONATE-NACL 1-0.675 GM/50ML-% IV SOLN
1.0000 g | Freq: Once | INTRAVENOUS | Status: AC
  Administered 2022-12-24: 1000 mg via INTRAVENOUS
  Filled 2022-12-24: qty 50

## 2022-12-24 NOTE — Progress Notes (Signed)
Pharmacy Antibiotic Note  Luis [name] is a 62 y.o. male admitted on 12/23/2022 with aspiration pneumonia.  Pharmacy has been consulted for Unasyn dosing. Renal function stable.   Plan: Initiate Unasyn 3g Q6H  Height: 6\' 1"  (185.4 cm) Weight: 114.4 kg (252 lb 3.3 oz) IBW/kg (Calculated) : 79.9  Temp (24hrs), Avg:98.2 F (36.8 C), Min:97 F (36.1 C), Max:99.7 F (37.6 C)  Recent Labs  Lab 12/23/22 1325 12/23/22 1816 12/23/22 2219 12/24/22 0041 12/24/22 0645 12/24/22 0836  WBC 14.2*  --   --  12.6*  --   --   CREATININE 0.82  --   --   --  1.64* 1.77*  LATICACIDVEN  --  1.5 1.4  --   --   --     Estimated Creatinine Clearance: 56.6 mL/min (A) (by C-G formula based on SCr of 1.77 mg/dL (H)).    Allergies  Allergen Reactions   Lisinopril     Other reaction(s): renal effects   Codeine Other (See Comments) and Hives    unknown Other reaction(s): Other (See Comments) unknown unknown    Antimicrobials this admission: 7/13 Unasyn >>  Dose adjustments this admission:   Microbiology results:   Thank you for allowing pharmacy to be a part of this patient's care.  Rowe Warman 12/24/2022 9:33 AM

## 2022-12-24 NOTE — Progress Notes (Signed)
   12/24/22 0300  BiPAP/CPAP/SIPAP  BiPAP/CPAP/SIPAP Pt Type Adult  BiPAP/CPAP/SIPAP V60  Mask Type Full face mask  Mask Size Large  Set Rate 10 breaths/min  Respiratory Rate 24 breaths/min  IPAP 16 cmH20  EPAP 8 cmH2O  FiO2 (%) 80 %  Minute Ventilation 20  Leak 46  Peak Inspiratory Pressure (PIP) 16  Tidal Volume (Vt) 682  Patient Home Equipment No  Auto Titrate No  Press High Alarm 25 cmH2O  Press Low Alarm 5 cmH2O

## 2022-12-24 NOTE — Progress Notes (Signed)
Echocardiogram 2D Echocardiogram has been performed.  Luis [name] 12/24/2022, 3:02 PM

## 2022-12-24 NOTE — Plan of Care (Signed)
  Problem: Elimination: Goal: Will not experience complications related to bowel motility Outcome: Progressing Goal: Will not experience complications related to urinary retention Outcome: Progressing   Problem: Pain Managment: Goal: General experience of comfort will improve Outcome: Progressing   

## 2022-12-24 NOTE — Assessment & Plan Note (Addendum)
Patient drinks 1 pint of Vodka daily.  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   Depression, continue with bupropion.

## 2022-12-24 NOTE — Consult Note (Signed)
WOC Nurse Consult Note  Consult completed remotely after review of electronic medical records including photographs and discussion with Bedside RN, Alfonse Alpers via Secure Chat.  WOC Nurse ostomy consult note Stoma type/location: RLQ ileostomy Stomal assessment/size: 1-inch, below skin level per notes from my associate, K. Mayo's encounter on 12/13/22. 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 Pat dry, apply skin barrier ring around stoma and pouch as per usual. Output Brown stool Ostomy pouching: 1pc.flat, flexible pouch with skin barrier ring Education provided: None Enrolled patient in [employer] DC program: No. Patient is established with a supplier    WOC Nurse Consult Note: Reason for Consult:irritant contact dermatitis in the abdominal area and periwound. See recent photodocumentation provided by K. Sanders to EMR on 12/05/22. Wound type:irritant contact dermatitis  ICD-10 CM Codes for Irritant Dermatitis  L24A9 - Due to friction or contact with other specified body fluids  Pressure Injury POA: N/A Measurement: See photographs. Diffuse over entire abdomen with wound Wound WUJ:WJXB pink and with yellow fibrinous tissue Drainage (amount, consistency, odor) small serous Periwound:as noted above, diffuse irritant contact dermatitis Dressing procedure/placement/frequency: Daily cleansing with pure hypochlorous acid is recommended and is the current POC. Once daily cleansing and soaking the affected area is to be followed with gentle pat dry, then by placing a Vashe moistened gauze over the wound. This  ius to be covered with dry gauze, then ABD pads and secured with 6-inch ACE bandages.   Recommend follow up with Surgery either while in house or in the office as next scheduled. Patient to keep scheduled appointments with WOC nurse in the outpatient ostomy clinic.  WOC nursing team will not follow, but will remain available to this patient, the nursing and medical teams.  Please re-consult if needed.  Thank you for inviting [place] to participate in this patient's Plan of Care.  Ladona Mow, MSN, RN, CNS, GNP, Leda Min, [employer], [employer] phone:  [phone]

## 2022-12-24 NOTE — Plan of Care (Signed)
  Problem: Respiratory: Goal: Levels of oxygenation will improve Outcome: Progressing   Problem: Respiratory: Goal: Ability to maintain a clear airway will improve Outcome: Progressing   Problem: Education: Goal: Individualized Educational Video(s) Outcome: Progressing

## 2022-12-24 NOTE — Progress Notes (Signed)
Echocardiogram 2D Echocardiogram has been performed.  Luis [name] 12/24/2022, 2:52 PM

## 2022-12-24 NOTE — Assessment & Plan Note (Signed)
Calculated BMI is 33.2  

## 2022-12-24 NOTE — Progress Notes (Signed)
Attempted patient off BIPAP and shortly after patient had Accessory muscle use, increased WOB/SOB, SPO2 on 10L dropped to 70s. Patient placed back on BIPAP. RN in room.

## 2022-12-24 NOTE — Progress Notes (Signed)
All wounds and ostomy change supplies order for patient, went to bedside to perform dressing and ostomy change but patient refused, says he wants it done tomorrow, I asked another RN to come with me and for the second time, he refuse. Patient said, "just empty my ostomy bag like you did this morning". RN emptied ostomy bag , clean and sealed.

## 2022-12-24 NOTE — Assessment & Plan Note (Addendum)
Continue glucose cover and monitoring with insulin sliding scale.  Patient on Bipap with limited po intake.   Continue statin therapy.

## 2022-12-24 NOTE — Assessment & Plan Note (Signed)
Continue metoprolol 25 mg po bid to avoid rebound tachycardia. Hold on amlodipine to prevent hypotension.

## 2022-12-24 NOTE — Assessment & Plan Note (Addendum)
Echocardiogram from 07/2022, with preserved LV systolic function EF 55 to 60%, RV systolic function preserved, LA and RA with normal size, no significant valvular disease.   His volume status has improved.  Systolic blood pressure 123 to 117   Plan to hold on furosemide and continue blood pressure monitoring.  Follow up on new echocardiogram.   Complete heart block, sp pacemaker.

## 2022-12-24 NOTE — Progress Notes (Addendum)
Progress Note   Patient: Luis [name] ZOX:[id number] DOB: 02-15-1961 DOA: 12/23/2022     1 DOS: the patient was seen and examined on 12/24/2022   Brief hospital course: [name] was admitted to the hospital with the working diagnosis of respiratory failure due to aspiration pneumonia, complicated with sepsis.   62 yo male with the past medical history of AV block sp pacemaker, hypertension, T2DM, dyslipidemia, and alcohol abuse who presented with dyspnea and wheezing. Reported productive cough for the last 2 to 3 days, that progressed to worsening dyspnea. Because of severe symptoms EMS was called. He was found with 02 saturation of 50%, he was placed on Cpap and transported to the ED. In the emergency room he was placed on Bipap for respiratory distress, his 02 saturation was 91%, blood pressure 115/79, HR 92, RR 15 and 02 saturation 93%, lungs with decreased breath sounds bilaterally, diffuse wheezing and scattered rales, increase work of breathing with accessory muscle use, heart with S1 and S2 present and rhythmic with no gallops or murmurs, abdomen not distended and no lower extremity edema. Positive superficial skin wounds on his abdomen.   VBG 7.35/ 42.9/ 135/ 25/ 99%  Na 133, K 4.7 CL 98, bicarbonate 22, glucose 147, BUM 17 and cr 0,82  AST 53, ALT 41 BNP 293 High sensitive troponin 32, 33, 31  Wbc 14.2 hgb 12.7 plt 239   Chest radiograph with cardiomegaly, bilateral hilar vascular congestion, with bilateral basal atelectasis.  CT chest with right upper and right middle lobe infiltrates, small bilateral pleural effusions. Faint bilateral ground glass opacities.   EKG 91 bpm, left axis deviation, left bundle branch block, qtc 518, sinus rhythm with first degree AV block, left atrial enlargement, q waves lead II, III, AvF, V3 to V5, no significant ST or changes.    Patient placed on furosemide and antibiotic therapy. 07/13 not able to wean off Bipap due to respiratory distress.    Assessment and Plan: * Community acquired pneumonia Acute hypoxemic respiratory failure.   Chest CT with right upper lobe and right middle lobe infiltrate suggestive of aspiration pneumonia, complicated with severe sepsis present on admission.   Plan to add antibiotic therapy with IV Unasyn, Add as needed bronchodilator therapy and scheduled IV steroids for severe pneumonia.   Continue respiratory support with Bipap, 16/8 with Fi02 80%.  Not able to tolerate wean for now due to rapid 02 desaturation.  Continue to monitor cell count and cultures. Aspiration precautions.   Acute on chronic diastolic CHF (congestive heart failure) (HCC) Echocardiogram from 07/2022, with preserved LV systolic function EF 55 to 60%, RV systolic function preserved, LA and RA with normal size, no significant valvular disease.   His volume status has improved.  Systolic blood pressure 123 to 117   Plan to hold on furosemide and continue blood pressure monitoring.  Follow up on new echocardiogram.   Complete heart block, sp pacemaker.   AKI (acute kidney injury) (HCC) Hyponatremia, Hyperkalemia, non anion gap metabolic acidosis.   Follow up renal function with serum cr at 1,77, K at 6,6 and serum bicarbonate at 23.  Na 128.  Plan to stop furosemide. Hyperkalemia therapy with IV insulin, IV dextrose and IV calcium gluconate (give Ca first).  Add sodium zirconium 10 g tid x 3 doses and follow up bmp this pm.   Essential hypertension Continue metoprolol 25 mg po bid to avoid rebound tachycardia. Hold on amlodipine to prevent hypotension.  Alcoholism (HCC) Patient drinks 1 pint of  Vodka daily.  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   Depression, continue with bupropion.    Type 2 diabetes mellitus with hyperlipidemia (HCC) Continue glucose cover and monitoring with insulin sliding scale.  Patient on Bipap with limited po intake.    Continue statin therapy.   Class 1 obesity Calculated BMI is 33.2   Skin rash Abdominal skin rash, positive desquamative skin. No purulent drainage.  Diagnoses with contact dermatitis as outpatient.  Continue local skin care. Patient has ileostomy in place.       Subjective: Patient wakes up to voice able to respond to questions and follow commands. He continue with full face mask bipap.   Physical Exam: Vitals:   12/24/22 0804 12/24/22 0836 12/24/22 0841 12/24/22 1100  BP: 117/77     Pulse: 95  98   Resp: 20 (!) 35 20 18  Temp: 97.6 F (36.4 C)     TempSrc: Axillary     SpO2: 94% (!) 85% 94% 94%  Weight:      Height:       Neurology sleep but able to wake up to voice, follows commands and respond to questions, no tremors or agitation.  ENT with mild pallor, full face mask bipap in place.  Cardiovascular with S1 and S2 present and regular with no gallops, rubs or murmurs Respiratory with rales at the right lower zone with no wheezing or rhonchi Abdomen protuberant, ileostomy in place. Has erythema and desquamative skin at  the anterior abdominal wall.  Trace non pitting lower extremity edema.  Data Reviewed:    Family Communication: no family at the bedside   Disposition: Status is: Inpatient Remains inpatient appropriate because: respiratory failure   Planned Discharge Destination: Home   Patient critically ill not able to wean off non invasive mechanical ventilation, multiorgan failure with respiratory and renal failure.  Critical care time 60 minutes Author: Coralie Keens, MD 12/24/2022 11:47 AM  For on call review [email or web address].

## 2022-12-24 NOTE — Hospital Course (Addendum)
Mr. Kuder was admitted to the hospital with the working diagnosis of respiratory failure due to aspiration pneumonia, complicated with sepsis.   62 yo male with the past medical history of AV block sp pacemaker, hypertension, T2DM, dyslipidemia, and alcohol abuse who presented with dyspnea and wheezing. Reported productive cough for the last 2 to 3 days, that progressed to worsening dyspnea. Because of severe symptoms EMS was called. He was found with 02 saturation of 50%, he was placed on Cpap and transported to the ED. In the emergency room he was placed on Bipap for respiratory distress, his 02 saturation was 91%, blood pressure 115/79, HR 92, RR 15 and 02 saturation 93%, lungs with decreased breath sounds bilaterally, diffuse wheezing and scattered rales, increase work of breathing with accessory muscle use, heart with S1 and S2 present and rhythmic with no gallops or murmurs, abdomen not distended and no lower extremity edema. Positive superficial skin wounds on his abdomen.   VBG 7.35/ 42.9/ 135/ 25/ 99%  Na 133, K 4.7 CL 98, bicarbonate 22, glucose 147, BUM 17 and cr 0,82  AST 53, ALT 41 BNP 293 High sensitive troponin 32, 33, 31  Wbc 14.2 hgb 12.7 plt 239   Chest radiograph with cardiomegaly, bilateral hilar vascular congestion, with bilateral basal atelectasis.  CT chest with right upper and right middle lobe infiltrates, small bilateral pleural effusions. Faint bilateral ground glass opacities.   EKG 91 bpm, left axis deviation, left bundle branch block, qtc 518, sinus rhythm with first degree AV block, left atrial enlargement, q waves lead II, III, AvF, V3 to V5, no significant ST or changes.    Patient placed on furosemide and antibiotic therapy. 07/13 not able to wean off Bipap due to respiratory distress.

## 2022-12-24 NOTE — Progress Notes (Signed)
TRH night cross cover note:   I was notified by RN of  QHS cbg result of 300. I subsequently added nightly sliding scale insulin coverage.     Newton Pigg, DO Hospitalist

## 2022-12-24 NOTE — Assessment & Plan Note (Addendum)
Acute hypoxemic respiratory failure.   Chest CT with right upper lobe and right middle lobe infiltrate suggestive of aspiration pneumonia, complicated with severe sepsis present on admission.   Plan to add antibiotic therapy with IV Unasyn, Add as needed bronchodilator therapy and scheduled IV steroids for severe pneumonia.   Continue respiratory support with Bipap, 16/8 with Fi02 80%.  Not able to tolerate wean for now due to rapid 02 desaturation.  Continue to monitor cell count and cultures. Aspiration precautions.

## 2022-12-24 NOTE — Assessment & Plan Note (Signed)
Hyponatremia, Hyperkalemia, non anion gap metabolic acidosis.   Follow up renal function with serum cr at 1,77, K at 6,6 and serum bicarbonate at 23.  Na 128.  Plan to stop furosemide. Hyperkalemia therapy with IV insulin, IV dextrose and IV calcium gluconate (give Ca first).  Add sodium zirconium 10 g tid x 3 doses and follow up bmp this pm.

## 2022-12-24 NOTE — Assessment & Plan Note (Addendum)
Abdominal skin rash, positive desquamative skin. No purulent drainage.  Diagnoses with contact dermatitis as outpatient.  Continue local skin care. Patient has ileostomy in place.

## 2022-12-25 ENCOUNTER — Inpatient Hospital Stay (HOSPITAL_COMMUNITY): Payer: Medicaid Other

## 2022-12-25 ENCOUNTER — Other Ambulatory Visit: Payer: Self-pay | Admitting: Student

## 2022-12-25 DIAGNOSIS — R918 Other nonspecific abnormal finding of lung field: Secondary | ICD-10-CM | POA: Diagnosis not present

## 2022-12-25 DIAGNOSIS — R21 Rash and other nonspecific skin eruption: Secondary | ICD-10-CM | POA: Diagnosis not present

## 2022-12-25 DIAGNOSIS — N179 Acute kidney failure, unspecified: Secondary | ICD-10-CM | POA: Diagnosis not present

## 2022-12-25 DIAGNOSIS — J9601 Acute respiratory failure with hypoxia: Secondary | ICD-10-CM

## 2022-12-25 DIAGNOSIS — I5033 Acute on chronic diastolic (congestive) heart failure: Secondary | ICD-10-CM | POA: Diagnosis not present

## 2022-12-25 DIAGNOSIS — R0602 Shortness of breath: Secondary | ICD-10-CM | POA: Diagnosis not present

## 2022-12-25 DIAGNOSIS — J189 Pneumonia, unspecified organism: Secondary | ICD-10-CM | POA: Diagnosis not present

## 2022-12-25 DIAGNOSIS — I517 Cardiomegaly: Secondary | ICD-10-CM | POA: Diagnosis not present

## 2022-12-25 DIAGNOSIS — Z4682 Encounter for fitting and adjustment of non-vascular catheter: Secondary | ICD-10-CM | POA: Diagnosis not present

## 2022-12-25 DIAGNOSIS — I10 Essential (primary) hypertension: Secondary | ICD-10-CM | POA: Diagnosis not present

## 2022-12-25 DIAGNOSIS — E1169 Type 2 diabetes mellitus with other specified complication: Secondary | ICD-10-CM | POA: Diagnosis not present

## 2022-12-25 DIAGNOSIS — E669 Obesity, unspecified: Secondary | ICD-10-CM | POA: Diagnosis not present

## 2022-12-25 DIAGNOSIS — E785 Hyperlipidemia, unspecified: Secondary | ICD-10-CM | POA: Diagnosis not present

## 2022-12-25 LAB — POCT I-STAT 7, (LYTES, BLD GAS, ICA,H+H)
Acid-base deficit: 2 mmol/L (ref 0.0–2.0)
Acid-base deficit: 1 mmol/L (ref 0.0–2.0)
Bicarbonate: 28.1 mmol/L — ABNORMAL HIGH (ref 20.0–28.0)
Bicarbonate: 24.4 mmol/L (ref 20.0–28.0)
Calcium, Ion: 1.35 mmol/L (ref 1.15–1.40)
Calcium, Ion: 1.27 mmol/L (ref 1.15–1.40)
HCT: 41 % (ref 39.0–52.0)
HCT: 32 % — ABNORMAL LOW (ref 39.0–52.0)
Hemoglobin: 13.9 g/dL (ref 13.0–17.0)
Hemoglobin: 10.9 g/dL — ABNORMAL LOW (ref 13.0–17.0)
O2 Saturation: 93 %
O2 Saturation: 99 %
Patient temperature: 97.6
Patient temperature: 98.6
Potassium: 5.5 mmol/L — ABNORMAL HIGH (ref 3.5–5.1)
Potassium: 4.7 mmol/L (ref 3.5–5.1)
Sodium: 134 mmol/L — ABNORMAL LOW (ref 135–145)
Sodium: 133 mmol/L — ABNORMAL LOW (ref 135–145)
TCO2: 30 mmol/L (ref 22–32)
TCO2: 26 mmol/L (ref 22–32)
pCO2 arterial: 72 mmHg (ref 32–48)
pCO2 arterial: 41.6 mmHg (ref 32–48)
pH, Arterial: 7.197 — CL (ref 7.35–7.45)
pH, Arterial: 7.376 (ref 7.35–7.45)
pO2, Arterial: 84 mmHg (ref 83–108)
pO2, Arterial: 129 mmHg — ABNORMAL HIGH (ref 83–108)

## 2022-12-25 LAB — CBC WITH DIFFERENTIAL/PLATELET
Abs Immature Granulocytes: 0.15 10*3/uL — ABNORMAL HIGH (ref 0.00–0.07)
Basophils Absolute: 0 10*3/uL (ref 0.0–0.1)
Basophils Relative: 0 %
Eosinophils Absolute: 0 10*3/uL (ref 0.0–0.5)
Eosinophils Relative: 0 %
HCT: 37.7 % — ABNORMAL LOW (ref 39.0–52.0)
Hemoglobin: 12.1 g/dL — ABNORMAL LOW (ref 13.0–17.0)
Immature Granulocytes: 1 %
Lymphocytes Relative: 3 %
Lymphs Abs: 0.5 10*3/uL — ABNORMAL LOW (ref 0.7–4.0)
MCH: 34 pg (ref 26.0–34.0)
MCHC: 32.1 g/dL (ref 30.0–36.0)
MCV: 105.9 fL — ABNORMAL HIGH (ref 80.0–100.0)
Monocytes Absolute: 1.1 10*3/uL — ABNORMAL HIGH (ref 0.1–1.0)
Monocytes Relative: 6 %
Neutro Abs: 16 10*3/uL — ABNORMAL HIGH (ref 1.7–7.7)
Neutrophils Relative %: 90 %
Platelets: 250 10*3/uL (ref 150–400)
RBC: 3.56 MIL/uL — ABNORMAL LOW (ref 4.22–5.81)
RDW: 13.8 % (ref 11.5–15.5)
WBC: 17.8 10*3/uL — ABNORMAL HIGH (ref 4.0–10.5)
nRBC: 0 % (ref 0.0–0.2)

## 2022-12-25 LAB — ECHOCARDIOGRAM COMPLETE
AR max vel: 2.22 cm2
AV Peak grad: 12.7 mmHg
Ao pk vel: 1.78 m/s
Area-P 1/2: 4.15 cm2
S' Lateral: 3.9 cm
Single Plane A4C EF: 56.1 %
Weight: 4035.3 oz

## 2022-12-25 LAB — LACTIC ACID, PLASMA
Lactic Acid, Venous: 1.5 mmol/L (ref 0.5–1.9)
Lactic Acid, Venous: 2 mmol/L (ref 0.5–1.9)
Lactic Acid, Venous: 1.2 mmol/L (ref 0.5–1.9)
Lactic Acid, Venous: 2 mmol/L (ref 0.5–1.9)

## 2022-12-25 LAB — PROCALCITONIN: Procalcitonin: 0.14 ng/mL

## 2022-12-25 LAB — BASIC METABOLIC PANEL
Anion gap: 8 (ref 5–15)
BUN: 50 mg/dL — ABNORMAL HIGH (ref 8–23)
CO2: 24 mmol/L (ref 22–32)
Calcium: 9.4 mg/dL (ref 8.9–10.3)
Chloride: 97 mmol/L — ABNORMAL LOW (ref 98–111)
Creatinine, Ser: 1.86 mg/dL — ABNORMAL HIGH (ref 0.61–1.24)
GFR, Estimated: 40 mL/min — ABNORMAL LOW (ref >60)
Glucose, Bld: 261 mg/dL — ABNORMAL HIGH (ref 70–99)
Potassium: 5.7 mmol/L — ABNORMAL HIGH (ref 3.5–5.1)
Sodium: 129 mmol/L — ABNORMAL LOW (ref 135–145)

## 2022-12-25 LAB — D-DIMER, QUANTITATIVE: D-Dimer, Quant: 0.65 ug/mL-FEU — ABNORMAL HIGH (ref 0.00–0.50)

## 2022-12-25 LAB — BLOOD GAS, ARTERIAL
Acid-base deficit: 1.4 mmol/L (ref 0.0–2.0)
Bicarbonate: 26.3 mmol/L (ref 20.0–28.0)
Drawn by: 22766
O2 Saturation: 93.2 %
Patient temperature: 37
pCO2 arterial: 56 mmHg — ABNORMAL HIGH (ref 32–48)
pH, Arterial: 7.28 — ABNORMAL LOW (ref 7.35–7.45)
pO2, Arterial: 65 mmHg — ABNORMAL LOW (ref 83–108)

## 2022-12-25 LAB — GLUCOSE, CAPILLARY
Glucose-Capillary: 234 mg/dL — ABNORMAL HIGH (ref 70–99)
Glucose-Capillary: 246 mg/dL — ABNORMAL HIGH (ref 70–99)
Glucose-Capillary: 286 mg/dL — ABNORMAL HIGH (ref 70–99)
Glucose-Capillary: 232 mg/dL — ABNORMAL HIGH (ref 70–99)

## 2022-12-25 LAB — MRSA NEXT GEN BY PCR, NASAL: MRSA by PCR Next Gen: NOT DETECTED

## 2022-12-25 LAB — AMMONIA: Ammonia: 34 umol/L (ref 9–35)

## 2022-12-25 LAB — BRAIN NATRIURETIC PEPTIDE: B Natriuretic Peptide: 1428.6 pg/mL — ABNORMAL HIGH (ref 0.0–100.0)

## 2022-12-25 MED ORDER — FAMOTIDINE 20 MG PO TABS
20.0000 mg | ORAL_TABLET | Freq: Two times a day (BID) | ORAL | Status: DC
  Administered 2022-12-25 – 2022-12-29 (×7): 20 mg
  Filled 2022-12-25 (×7): qty 1

## 2022-12-25 MED ORDER — FENTANYL CITRATE PF 50 MCG/ML IJ SOSY: 50.0000 ug | PREFILLED_SYRINGE | Freq: Once | INTRAMUSCULAR | Status: DC

## 2022-12-25 MED ORDER — LORAZEPAM 2 MG/ML IJ SOLN
2.0000 mg | Freq: Once | INTRAMUSCULAR | Status: DC
  Filled 2022-12-25: qty 1

## 2022-12-25 MED ORDER — PROPOFOL 1000 MG/100ML IV EMUL
5.0000 ug/kg/min | INTRAVENOUS | Status: DC
  Administered 2022-12-25: 5 ug/kg/min via INTRAVENOUS
  Filled 2022-12-25: qty 100

## 2022-12-25 MED ORDER — FENTANYL CITRATE PF 50 MCG/ML IJ SOSY
PREFILLED_SYRINGE | INTRAMUSCULAR | Status: AC
  Filled 2022-12-25: qty 2

## 2022-12-25 MED ORDER — SODIUM CHLORIDE 0.9 % IV SOLN: INTRAVENOUS | Status: DC | PRN

## 2022-12-25 MED ORDER — DEXMEDETOMIDINE HCL IN NACL 400 MCG/100ML IV SOLN: 0.0000 ug/kg/h | INTRAVENOUS | Status: DC

## 2022-12-25 MED ORDER — SODIUM CHLORIDE 0.9 % IV SOLN
500.0000 mg | INTRAVENOUS | Status: AC
  Administered 2022-12-25 – 2022-12-29 (×5): 500 mg via INTRAVENOUS
  Filled 2022-12-25 (×5): qty 5

## 2022-12-25 MED ORDER — KETAMINE HCL 50 MG/5ML IJ SOSY: 50.0000 mg | PREFILLED_SYRINGE | INTRAMUSCULAR | Status: AC

## 2022-12-25 MED ORDER — BUDESONIDE 0.5 MG/2ML IN SUSP
0.5000 mg | Freq: Two times a day (BID) | RESPIRATORY_TRACT | Status: DC
  Administered 2022-12-26 – 2023-01-01 (×13): 0.5 mg via RESPIRATORY_TRACT
  Filled 2022-12-25 (×13): qty 2

## 2022-12-25 MED ORDER — VANCOMYCIN HCL 1250 MG/250ML IV SOLN
1250.0000 mg | INTRAVENOUS | Status: DC
  Administered 2022-12-25: 1250 mg via INTRAVENOUS
  Filled 2022-12-25 (×2): qty 250

## 2022-12-25 MED ORDER — INSULIN ASPART 100 UNIT/ML IJ SOLN
0.0000 [IU] | INTRAMUSCULAR | Status: DC
  Administered 2022-12-25: 11 [IU] via SUBCUTANEOUS
  Administered 2022-12-25: 7 [IU] via SUBCUTANEOUS
  Administered 2022-12-26 (×3): 4 [IU] via SUBCUTANEOUS
  Administered 2022-12-26 (×3): 7 [IU] via SUBCUTANEOUS
  Administered 2022-12-27: 3 [IU] via SUBCUTANEOUS
  Administered 2022-12-27: 4 [IU] via SUBCUTANEOUS
  Administered 2022-12-27: 7 [IU] via SUBCUTANEOUS
  Administered 2022-12-27: 3 [IU] via SUBCUTANEOUS
  Administered 2022-12-28: 4 [IU] via SUBCUTANEOUS
  Administered 2022-12-28: 3 [IU] via SUBCUTANEOUS
  Administered 2022-12-28: 4 [IU] via SUBCUTANEOUS
  Administered 2022-12-28: 7 [IU] via SUBCUTANEOUS
  Administered 2022-12-29: 4 [IU] via SUBCUTANEOUS
  Administered 2022-12-29: 20 [IU] via SUBCUTANEOUS
  Administered 2022-12-29: 11 [IU] via SUBCUTANEOUS

## 2022-12-25 MED ORDER — HALOPERIDOL LACTATE 5 MG/ML IJ SOLN
5.0000 mg | Freq: Once | INTRAMUSCULAR | Status: DC
  Filled 2022-12-25: qty 1

## 2022-12-25 MED ORDER — ORAL CARE MOUTH RINSE: 15.0000 mL | OROMUCOSAL | Status: DC | PRN

## 2022-12-25 MED ORDER — SUCCINYLCHOLINE CHLORIDE 200 MG/10ML IV SOSY
PREFILLED_SYRINGE | INTRAVENOUS | Status: AC
  Filled 2022-12-25: qty 10

## 2022-12-25 MED ORDER — ETOMIDATE 2 MG/ML IV SOLN
INTRAVENOUS | Status: AC
  Filled 2022-12-25: qty 20

## 2022-12-25 MED ORDER — SODIUM ZIRCONIUM CYCLOSILICATE 10 G PO PACK
10.0000 g | PACK | Freq: Two times a day (BID) | ORAL | Status: DC
  Administered 2022-12-25: 10 g
  Filled 2022-12-25: qty 1

## 2022-12-25 MED ORDER — ROCURONIUM BROMIDE 10 MG/ML (PF) SYRINGE
PREFILLED_SYRINGE | INTRAVENOUS | Status: AC
  Administered 2022-12-25: 100 mg
  Filled 2022-12-25: qty 10

## 2022-12-25 MED ORDER — FENTANYL BOLUS VIA INFUSION: 50.0000 ug | INTRAVENOUS | Status: DC | PRN

## 2022-12-25 MED ORDER — KETAMINE HCL 50 MG/5ML IJ SOSY
PREFILLED_SYRINGE | INTRAMUSCULAR | Status: AC
  Administered 2022-12-25: 50 mg via INTRAVENOUS
  Filled 2022-12-25: qty 10

## 2022-12-25 MED ORDER — FENTANYL CITRATE PF 50 MCG/ML IJ SOSY: 50.0000 ug | PREFILLED_SYRINGE | INTRAMUSCULAR | Status: DC | PRN

## 2022-12-25 MED ORDER — PHENOL 1.4 % MT LIQD: 1.0000 | OROMUCOSAL | Status: DC | PRN

## 2022-12-25 MED ORDER — ATORVASTATIN CALCIUM 10 MG PO TABS
10.0000 mg | ORAL_TABLET | Freq: Every day | ORAL | Status: DC
  Administered 2022-12-26 – 2022-12-27 (×2): 10 mg
  Filled 2022-12-25 (×2): qty 1

## 2022-12-25 MED ORDER — CHLORHEXIDINE GLUCONATE CLOTH 2 % EX PADS
6.0000 | MEDICATED_PAD | Freq: Every day | CUTANEOUS | Status: DC
  Administered 2022-12-25 – 2023-01-01 (×7): 6 via TOPICAL

## 2022-12-25 MED ORDER — POLYETHYLENE GLYCOL 3350 17 G PO PACK
17.0000 g | PACK | Freq: Every day | ORAL | Status: DC
  Administered 2022-12-25 – 2022-12-27 (×3): 17 g
  Filled 2022-12-25 (×3): qty 1

## 2022-12-25 MED ORDER — MIDAZOLAM HCL 2 MG/2ML IJ SOLN
INTRAMUSCULAR | Status: AC
  Administered 2022-12-25: 2 mg via INTRAVENOUS
  Filled 2022-12-25: qty 2

## 2022-12-25 MED ORDER — PROPOFOL 1000 MG/100ML IV EMUL
5.0000 ug/kg/min | INTRAVENOUS | Status: DC
  Administered 2022-12-25 – 2022-12-26 (×4): 40 ug/kg/min via INTRAVENOUS
  Filled 2022-12-25 (×3): qty 100
  Filled 2022-12-25: qty 300

## 2022-12-25 MED ORDER — INSULIN GLARGINE-YFGN 100 UNIT/ML ~~LOC~~ SOLN
8.0000 [IU] | Freq: Two times a day (BID) | SUBCUTANEOUS | Status: DC
  Administered 2022-12-25: 8 [IU] via SUBCUTANEOUS
  Filled 2022-12-25 (×3): qty 0.08

## 2022-12-25 MED ORDER — KETAMINE HCL 50 MG/5ML IJ SOSY
PREFILLED_SYRINGE | INTRAMUSCULAR | Status: AC
  Administered 2022-12-25: 50 mg via INTRAVENOUS
  Filled 2022-12-25: qty 5

## 2022-12-25 MED ORDER — INSULIN GLARGINE-YFGN 100 UNIT/ML ~~LOC~~ SOLN
12.0000 [IU] | Freq: Two times a day (BID) | SUBCUTANEOUS | Status: DC
  Administered 2022-12-25: 12 [IU] via SUBCUTANEOUS
  Filled 2022-12-25 (×3): qty 0.12

## 2022-12-25 MED ORDER — FENTANYL 2500MCG IN NS 250ML (10MCG/ML) PREMIX INFUSION
50.0000 ug/h | INTRAVENOUS | Status: DC
  Administered 2022-12-25 – 2022-12-26 (×2): 100 ug/h via INTRAVENOUS
  Filled 2022-12-25 (×2): qty 250

## 2022-12-25 MED ORDER — FUROSEMIDE 10 MG/ML IJ SOLN
40.0000 mg | Freq: Every day | INTRAMUSCULAR | Status: DC
  Administered 2022-12-25 – 2023-01-01 (×8): 40 mg via INTRAVENOUS
  Filled 2022-12-25 (×8): qty 4

## 2022-12-25 MED ORDER — DOCUSATE SODIUM 50 MG/5ML PO LIQD
100.0000 mg | Freq: Two times a day (BID) | ORAL | Status: DC
  Administered 2022-12-25 – 2022-12-27 (×4): 100 mg
  Filled 2022-12-25 (×4): qty 10

## 2022-12-25 MED ORDER — INSULIN ASPART 100 UNIT/ML IV SOLN: 8.0000 [IU] | Freq: Two times a day (BID) | INTRAVENOUS | Status: DC

## 2022-12-25 MED ORDER — ARFORMOTEROL TARTRATE 15 MCG/2ML IN NEBU
15.0000 ug | INHALATION_SOLUTION | Freq: Two times a day (BID) | RESPIRATORY_TRACT | Status: DC
  Administered 2022-12-25 – 2023-01-01 (×15): 15 ug via RESPIRATORY_TRACT
  Filled 2022-12-25 (×15): qty 2

## 2022-12-25 MED ORDER — ORAL CARE MOUTH RINSE
15.0000 mL | OROMUCOSAL | Status: DC
  Administered 2022-12-25 – 2022-12-29 (×36): 15 mL via OROMUCOSAL

## 2022-12-25 MED ORDER — MIDAZOLAM HCL 2 MG/2ML IJ SOLN: 2.0000 mg | Freq: Once | INTRAMUSCULAR | Status: AC

## 2022-12-25 NOTE — Consult Note (Addendum)
NAME:  Luis [name], MRN:  [id number], DOB:  04-07-1961, LOS: 2 ADMISSION DATE:  12/23/2022, CONSULTATION DATE:  7/14  REFERRING MD:  Gwenlyn Perking , CHIEF COMPLAINT: pneumonia    History of Present Illness:  This is a 62 year old male w/ hx as outlined below. Presented to the ER 7/12 w/ cc: 2-3 d h/o worsening productive cough and increased shortness of breath. No clear sick exposures. Does have h/o ETOH. Room air pulse ox in 50s. Placed initially on bipap but converted to HFNC, CXR w/ R>L airspace disease and CT chest showing bibasilar consolidation w/ air bronchogram as well as small bilateral effusions. Started on IV antibiotics, w/ working dx of aspiration PNA. Also placed on systemic steroids and BDs.   Over the following 48 hours Was admitted to the progressive care unit.  He continued to require intermittent BiPAP and heated high flow, over the course of the hospitalization has had slight increase restlessness with elevated CIWA score.  On 7/14 he continued to require high levels of supplemental oxygen including intermittent BiPAP with 100% FiO2.  He desaturates rapidly to the 70s anytime he removes the mask.  Portable chest x-ray was obtained and this showed progressive right-sided airspace disease and because of progressive decline as well as frequent episodes of hypoxia when he removed mask in the context of probable early withdrawal critical care was asked to evaluate  Pertinent  Medical History  AV block w/ PPM, HTN, type II DM, ETOH abuse (pint of vodka/d) HFpEF, HL, DVT, diverticulitis, ileostomy, non-healing abd wound followed in wound clinic abd hernia  Significant Hospital Events: Including procedures, antibiotic start and stop dates in addition to other pertinent events   7/12 admitted Placed on supplemental oxygen as well as intermittent BiPAP started initially on doxycycline, then transition to Unasyn for possible aspiration 7/13 still hypoxic still on and off BiPAP.  critical care  asked to see  Echocardiogram EF 50 to 55% with low normal function no regional wall abnormality grade 2 diastolic heart dysfunction left atrium mildly dilated normal right ventricular function normal right ventricular size 7/14 continues to require high FiO2 intermittent  need for benzodiazepines for withdrawal   Interim History / Subjective:  Currently on NIPPV no distress but does exhibit some mild work of breathing  Objective   Blood pressure 132/82, pulse 90, temperature 97.8 F (36.6 C), resp. rate (Abnormal) 32, height 6\' 1"  (1.854 m), weight 112.4 kg, SpO2 94%.    FiO2 (%):  [80 %-100 %] 100 %   Intake/Output Summary (Last 24 hours) at 12/25/2022 1302 Last data filed at 12/25/2022 4098 Gross per 24 hour  Intake 300 ml  Output 1000 ml  Net -700 ml   Filed Weights   12/23/22 1317 12/24/22 0413 12/25/22 0600  Weight: 110.7 kg 114.4 kg 112.4 kg    Examination: General: Chronically ill-appearing 62 year old male patient currently on noninvasive positive pressure ventilation HENT: BiPAP mask in place no clear jugular venous distention Lungs: Faint expiratory wheezes, some crackles right side currently on 100% FiO2 portable chest x-ray personally reviewed showing worsening aeration of the right hemithorax Cardiovascular: Regular rate and rhythm without murmur rub or gallop Abdomen: Soft.  Chronic right skin and rash over the abdomen with nonhealing abdominal wound dressing intact right lower quadrant ileostomy tube draining stool bowel sounds are positive  Extremities: Warm dry brisk capillary refill trace edema Neuro: Awake oriented, intermittently restless GU: Voids  Resolved Hospital Problem list     Assessment & Plan:  Acute hypoxic and hypercarbic respiratory failure in the setting of pneumonia, superimposed on chronic obstructive pulmonary disease.  Presuming aspiration versus CAP. Cxr worse. Not certain however that his CXR explains his degree of  hypoxia, Plan Continuing supplemental oxygen and intermittent BiPAP Agree with IV Lasix, received dose prior to my evaluation Day #2 Unasyn, add back atypical coverage, also vancomycin given clinical decline.  Will send MRSA PCR and discontinue Vanco if negative Continue scheduled bronchodilators and inhaled corticosteroids Continuing pulse steroids started on the 13th Check BNP, check and trend procalcitonin Will check lower extremity ultrasound, has a history of DVT.  His P/F ratio seems out of proportion to chest x-ray findings Checking d-dimer (if negative will not worry about AC but if positive given h/o DVT will consider systemic AC while we await LEUS) Checking lactic acid  Acute diastolic heart failure with element of pulmonary edema Plan Continuing supplemental oxygen IV diuresis Holding Norvasc for now  AKI.  Serum creatinine seems to have stabilized Plan Keep euvolemic Renal dose meds Follow-up a.m. chemistry Daily assessment for diuretics  Fluid and electrolyte balance: Hyponatremia, hyperkalemia Plan Getting IV Lasix Will give one-time dose of Lokelma Follow serial chemistries  Hyperglycemia with suboptimal control Plan increase his Semglee to 12 units twice a day Change sliding scale to every 4 resistant  Acute delirium with acute alcohol withdrawal Plan Continuing thiamine and folate Seems to be doing well with CIWA protocol we will continue  Class 1 obesity  Plan Dietary consultation when appropriate  Chronic abdominal skin rash felt contact dermatitis with nonhealing abdominal wound.  Typically followed at wound ostomy clinic.  Has history of prior ileostomy Plan Continue current wound care as outlined by wound ostomy nurse which includes daily cleansing and soaking of affected areas to be followed by gentle pat dry then Vashe moistened gauze applied with ABD and a security bandage over that     Best Practice (right click and "Reselect all  SmartList Selections" daily)   Diet/type: NPO DVT prophylaxis: LMWH GI prophylaxis: N/A Lines: N/A Foley:  N/A Code Status:  full code Last date of multidisciplinary goals of care discussion [pending]  Labs   CBC: Recent Labs  Lab 12/23/22 1325 12/23/22 1414 12/24/22 0041 12/25/22 0450  WBC 14.2*  --  12.6* 17.8*  NEUTROABS 10.7*  --   --  16.0*  HGB 12.7* 13.3 13.0 12.1*  HCT 38.2* 39.0 39.7 37.7*  MCV 104.7*  --  106.7* 105.9*  PLT 239  --  265 250    Basic Metabolic Panel: Recent Labs  Lab 12/23/22 1325 12/23/22 1414 12/24/22 0645 12/24/22 0836 12/24/22 1832 12/25/22 0450  NA 133* 132* 127* 128* 128* 129*  K 4.7 4.8 7.3* 6.6* 5.5* 5.7*  CL 98  --  96* 95* 93* 97*  CO2 22  --  20* 23 23 24   GLUCOSE 147*  --  294* 301* 342* 261*  BUN 17  --  33* 35* 47* 50*  CREATININE 0.82  --  1.64* 1.77* 1.94* 1.86*  CALCIUM 9.1  --  8.5* 8.7* 9.2 9.4   GFR: Estimated Creatinine Clearance: 53.4 mL/min (A) (by C-G formula based on SCr of 1.86 mg/dL (H)). Recent Labs  Lab 12/23/22 1325 12/23/22 1538 12/23/22 1816 12/23/22 2219 12/24/22 0041 12/25/22 0450  PROCALCITON  --  0.23  --   --   --   --   WBC 14.2*  --   --   --  12.6* 17.8*  LATICACIDVEN  --   --  1.5 1.4  --   --     Liver Function Tests: Recent Labs  Lab 12/23/22 1325  AST 53*  ALT 41  ALKPHOS 156*  BILITOT 0.5  PROT 8.1  ALBUMIN 3.8   No results for input(s): "LIPASE", "AMYLASE" in the last 168 hours. No results for input(s): "AMMONIA" in the last 168 hours.  ABG    Component Value Date/Time   PHART 7.28 (L) 12/25/2022 0947   PCO2ART 56 (H) 12/25/2022 0947   PO2ART 65 (L) 12/25/2022 0947   HCO3 26.3 12/25/2022 0947   TCO2 25 12/23/2022 1414   ACIDBASEDEF 1.4 12/25/2022 0947   O2SAT 93.2 12/25/2022 0947     Coagulation Profile: No results for input(s): "INR", "PROTIME" in the last 168 hours.  Cardiac Enzymes: No results for input(s): "CKTOTAL", "CKMB", "CKMBINDEX", "TROPONINI" in  the last 168 hours.  HbA1C: Hgb A1c MFr Bld  Date/Time Value Ref Range Status  11/04/2022 11:46 AM 8.0 (H) 4.6 - 6.5 % Final    Comment:    Glycemic Control Guidelines for People with Diabetes:Non Diabetic:  <6%Goal of Therapy: <7%Additional Action Suggested:  >8%   12/26/2020 02:58 PM 5.6 4.8 - 5.6 % Final    Comment:    (NOTE) Pre diabetes:          5.7%-6.4%  Diabetes:              >6.4%  Glycemic control for   <7.0% adults with diabetes     CBG: Recent Labs  Lab 12/24/22 1154 12/24/22 1632 12/24/22 2133 12/25/22 0611 12/25/22 1119  GLUCAP 276* 305* 300* 234* 246*    Review of Systems:   Not able d/t delirium   Past Medical History:  He,  has a past medical history of Diabetes mellitus without complication (HCC), Diverticulitis, DVT (deep venous thrombosis) (HCC), ETOH abuse, H/O colectomy, Hyperlipidemia, Hypertension, and Smoker.   Surgical History:   Past Surgical History:  Procedure Laterality Date   COLOSTOMY     PACEMAKER LEADLESS INSERTION N/A 12/31/2020   Procedure: PACEMAKER LEADLESS INSERTION;  Surgeon: [name], MD;  Location: [facility];  Service: Cardiovascular;  Laterality: N/A;   TONSILLECTOMY       Social History:   reports that he has been smoking cigarettes. He has a 44 pack-year smoking history. He uses smokeless tobacco. He reports current alcohol use. He reports current drug use. Drug: Marijuana.   Family History:  His family history includes COPD in his mother; Prostate cancer in his father. There is no history of Colon cancer, Rectal cancer, Stomach cancer, or Esophageal cancer.   Allergies Allergies  Allergen Reactions   Lisinopril     Other reaction(s): renal effects   Codeine Other (See Comments) and Hives    unknown Other reaction(s): Other (See Comments) unknown unknown     Home Medications  Prior to Admission medications   Medication Sig Start Date End Date Taking? Authorizing Provider  acetaminophen  (TYLENOL) 650 MG CR tablet Take 1 tablet (650 mg total) by mouth every 8 (eight) hours as needed for pain. 11/10/22  Yes [name], NP  amLODipine (NORVASC) 10 MG tablet Take 10 mg by mouth daily.   Yes [provider]  atorvastatin (LIPITOR) 10 MG tablet Take 10 mg by mouth daily. 11/20/20  Yes [provider]  buPROPion (WELLBUTRIN SR) 150 MG 12 hr tablet Take 1 tablet by mouth once in the morning for 3 days, then take 1 tablet by mouth  in the morning and the evening 12/09/22  Yes [name], NP  cetirizine (ZYRTEC) 10 MG tablet Take 1 tablet (10 mg total) by mouth daily. 11/10/22  Yes [name], NP  folic acid (FOLVITE) 1 MG tablet TAKE 1 TABLET(1 MG) BY MOUTH DAILY 01/07/22  Yes Graciella Freer, PA-C  metFORMIN (GLUCOPHAGE) 1000 MG tablet Take 0.5 tablets (500 mg total) by mouth 2 (two) times daily with a meal. Patient taking differently: Take 1,000 mg by mouth daily. 12/09/22  Yes [name], NP  metoprolol succinate (TOPROL-XL) 50 MG 24 hr tablet TAKE 1 TABLET(50 MG) BY MOUTH DAILY 02/28/22  Yes Sheilah Pigeon, PA-C  Multiple Vitamin (MULTIVITAMIN WITH MINERALS) TABS tablet Take 1 tablet by mouth every other day.    Yes [provider]  thiamine (VITAMIN B1) 100 MG tablet Take 1 tablet (100 mg total) by mouth daily. 11/10/22  Yes [name], NP  triamcinolone cream (KENALOG) 0.5 % Apply to legs twice a day for up to two weeks as needed for eczema 12/09/22  Yes [name], NP  traMADol (ULTRAM) 50 MG tablet Take 1 tablet (50 mg total) by mouth every 6 (six) hours as needed for moderate pain. Patient not taking: Reported on 12/23/2022 08/04/19   Burnadette Pop, MD     Critical care time: 50 minutes     Simonne Martinet ACNP-BC [facility] Pager # [phone] OR # [phone] if no answer

## 2022-12-25 NOTE — Progress Notes (Signed)
At 1550 pt pulling off bipap multiple times, pulling out iv, and attempting to get out of bed.  Anders Simmonds notified.

## 2022-12-25 NOTE — Progress Notes (Signed)
   12/25/22 1219  Assess: MEWS Score  BP 132/82  Pulse Rate 90  Resp (!) 32  Level of Consciousness Alert  SpO2 94 %  O2 Device Bi-PAP  FiO2 (%) 100 %  Assess: MEWS Score  MEWS Temp 0  MEWS Systolic 0  MEWS Pulse 0  MEWS RR 2  MEWS LOC 0  MEWS Score 2  MEWS Score Color Yellow  Assess: if the MEWS score is Yellow or Red  Were vital signs taken at a resting state? Yes  Focused Assessment No change from prior assessment  Does the patient meet 2 or more of the SIRS criteria? No  MEWS guidelines implemented  Yes, yellow  Treat  MEWS Interventions Considered administering scheduled or prn medications/treatments as ordered  Take Vital Signs  Increase Vital Sign Frequency  Yellow: Q2hr x1, continue Q4hrs until patient remains green for 12hrs  Escalate  MEWS: Escalate Yellow: Discuss with charge nurse and consider notifying provider and/or RRT  Notify: Charge Nurse/RN  Name of Charge Nurse/RN Guadalupe Dawn, RN  Provider Notification  Provider Name/Title Gwenlyn Perking  Date Provider Notified 12/25/22  Time Provider Notified 1223  Method of Notification Page  Notification Reason Other (Comment) (Pt pulled off HFNC, desatted, very difficult to recover. SpO2 holding at 87%)  Provider response See new orders  Date of Provider Response 12/25/22  Time of Provider Response 1223  Notify: Rapid Response  Name of Rapid Response RN Notified Notified by Charge, RN  Date Rapid Response Notified 12/25/22  Time Rapid Response Notified 1215  Assess: SIRS CRITERIA  SIRS Temperature  0  SIRS Pulse 0  SIRS Respirations  1  SIRS WBC 1  SIRS Score Sum  2

## 2022-12-25 NOTE — Progress Notes (Signed)
Afternoon note  Called to bedside On my arrival the pt was awake, more confused and agitated. Actually requiring nursing staff to keep him in bed. Now w/ marked increase in WOB. Sats maintaining on NIPPV but rapidly desaturated.  Interval data  BNP now 1429 PCT 0.14 Lactic acid 2 (I think this is more from increased WOB as not hypotensive)   Because of his decline we went ahead w/ intubation. He was pre-medicated w/ versed and Ketamine to facilitate pre-oxygenation while on NIPPV. He was subsequently intubated. Post-intubation c/b brief <1 min desaturation that responded to vent recruitment maneuver Plan Dc CIWA Prop infusion  Full vent support Resp culture  Abx as per before  F/u lactate now that WOB addressed.  F/u abg   26 minutes at bedside for direct critical  care   Simonne Martinet ACNP-BC [facility] Pulmonary/Critical Care Pager # [phone] OR # [phone] if no answer

## 2022-12-25 NOTE — Progress Notes (Signed)
Critical ABG results given to Dr Katrinka Blazing at 1750 pH 7.19 pCO2-72 pO2-84 HCO3 28.1 RR to 28 and repeat ABG at 1930

## 2022-12-25 NOTE — Procedures (Signed)
Intubation Procedure Note  Jakaiden Jabs  [id number]  1961-02-26  Date:12/25/22  Time:4:24 PM   Provider Performing:Pete E Tanja Port    Procedure: Intubation (31500)  Indication(s) Respiratory Failure  Consent Risks of the procedure as well as the alternatives and risks of each were explained to the patient and/or caregiver.  Consent for the procedure was obtained and is signed in the bedside chart   Anesthesia Versed and Rocuronium and ketamine. Initially premedicated w/.2mg  versed followed by 2 doses of ketamine 50mg  with the second dose followed by the rocuronium    Time Out Verified patient identification, verified procedure, site/side was marked, verified correct patient position, special equipment/implants available, medications/allergies/relevant history reviewed, required imaging and test results available.   Sterile Technique Usual hand hygeine, masks, and gloves were used   Procedure Description Patient positioned in bed supine.  Sedation given as noted above.  Patient was intubated with endotracheal tube using Glidescope.  View was Grade 1 full glottis .  Number of attempts was 1.  Colorimetric CO2 detector was consistent with tracheal placement.   Complications/Tolerance Brief desaturation to lowest point of 78% after airway secured. Responded in less than 30 seconds to recruitment maneuver on vent  Chest X-ray is ordered to verify placement.   EBL Minimal   Specimen(s) None

## 2022-12-25 NOTE — Progress Notes (Signed)
Progress Note   Patient: Luis [name] ZOX:[id number] DOB: 1960/09/22 DOA: 12/23/2022     2 DOS: the patient was seen and examined on 12/25/2022   Brief hospital course: [name] was admitted to the hospital with the working diagnosis of respiratory failure due to aspiration pneumonia, complicated with sepsis.   62 yo male with the past medical history of AV block sp pacemaker, hypertension, T2DM, dyslipidemia, and alcohol abuse who presented with dyspnea and wheezing. Reported productive cough for the last 2 to 3 days, that progressed to worsening dyspnea. Because of severe symptoms EMS was called. He was found with 02 saturation of 50%, he was placed on Cpap and transported to the ED. In the emergency room he was placed on Bipap for respiratory distress, his 02 saturation was 91%, blood pressure 115/79, HR 92, RR 15 and 02 saturation 93%, lungs with decreased breath sounds bilaterally, diffuse wheezing and scattered rales, increase work of breathing with accessory muscle use, heart with S1 and S2 present and rhythmic with no gallops or murmurs, abdomen not distended and no lower extremity edema. Positive superficial skin wounds on his abdomen.   VBG 7.35/ 42.9/ 135/ 25/ 99%  Na 133, K 4.7 CL 98, bicarbonate 22, glucose 147, BUM 17 and cr 0,82  AST 53, ALT 41 BNP 293 High sensitive troponin 32, 33, 31  Wbc 14.2 hgb 12.7 plt 239   Chest radiograph with cardiomegaly, bilateral hilar vascular congestion, with bilateral basal atelectasis.  CT chest with right upper and right middle lobe infiltrates, small bilateral pleural effusions. Faint bilateral ground glass opacities.   EKG 91 bpm, left axis deviation, left bundle branch block, qtc 518, sinus rhythm with first degree AV block, left atrial enlargement, q waves lead II, III, AvF, V3 to V5, no significant ST or changes.    Patient placed on furosemide and antibiotic therapy. 07/13 not able to wean off Bipap due to respiratory distress.    Assessment and Plan: * Community acquired pneumonia Acute hypoxemic respiratory failure.   Chest CT with right upper lobe and right middle lobe infiltrate suggestive of aspiration pneumonia, complicated with severe sepsis present on admission.   Continue antibiotic therapy with IV Unasyn, Continue steroid, bronchodilators management and oxygen supplementation.   PCCM consulted and will follow recommendations. Patient is high risk for decompensation and might need intubation with mechanical ventilation.  Abnormal ABG and worsening x-ray appreciated.  Acute on chronic diastolic CHF (congestive heart failure) (HCC) Echocardiogram from 07/2022, with preserved LV systolic function EF 55 to 60%, RV systolic function preserved, LA and RA with normal size, no significant valvular disease.   His volume status has improved; but still demonstrating vascular congestion and worsening resp distress. -lasix daily re-started -follow renal function, electrolytes and volume status trend -wean off oxygen supplementation as tolerated.  Patient with hx of Complete heart block, sp pacemaker.   AKI (acute kidney injury) (HCC) -mild Hyponatremia in the setting of hyperglycemia -Hyperkalemia, non anion gap metabolic acidosis.  -checking lactic acid -continue to follow strict I's and O's and urine output -Resume the use of Lasix -Closely follow renal function trend -Avoid the use of contrast, hypotension and nephrotoxic agents as much as possible.  Essential hypertension -Stable overall -Continue current antihypertensive agents -Continue holding amlodipine.  Continue metoprolol 25 mg po bid to avoid rebound tachycardia  Alcoholism (HCC) -Patient drinks 1 pint of Vodka daily.  -Understands importance of quitting and said that he is working on it -No significant withdrawal currently appreciated -Patient  remains at high risk of withdrawal, continue with CIWA protocol with close neurologic follow up.   -Continue the use of thiamine and folic acid -Continue bupropion  Type 2 diabetes mellitus with hyperlipidemia (HCC) -Continue to follow CBGs fluctuation of her adjust hypoglycemic regimen. -Patient's hyperglycemia in the setting of his steroids usage -Continue sliding scale insulin and the use of Semglee -Continue glucose cover and monitoring with insulin sliding scale.  -Continue statin therapy. -Modified carbohydrate diet discussed with patient.  Class 1 obesity -Body mass index is 32.68 kg/m. -low calorie diet and portion control recommended.  Skin rash -Abdominal skin rash, positive desquamative skin. No purulent drainage. -Secondary to contact dermatitis -Continue local skin care and follow recommendations by wound care service. -Patient has ileostomy in place.    Subjective:  Acutely ill, no chest pain, no nausea or vomiting currently reported; patient is afebrile.  Demonstrating difficulty speaking in full sentences, intermittent coughing spells requiring high levels of oxygen supplementation.  Overnight patient was in need of BiPAP.  Physical Exam: Vitals:   12/25/22 0822 12/25/22 1048 12/25/22 1130 12/25/22 1219  BP:  132/84 (!) 113/101 132/82  Pulse: 94 97 91 90  Resp: 18  (!) 25 (!) 32  Temp:   97.8 F (36.6 C)   TempSrc:      SpO2: 94%  (!) 88% 94%  Weight:      Height:       General exam: Alert, awake, oriented x 2; with moments of being intermittently confused according to nursing reports.  Currently afebrile; demonstrating intermittent coughing spells and mild difficulty speaking in full sentences.  High flow nasal cannula in place. Respiratory system: Positive rhonchi bilaterally (right more than left); expiratory wheezing and fine crackles appreciated. Positive tachypnea. Cardiovascular system:RRR. No rub or gallop; no JVD. Gastrointestinal system: Abdomen is obese, nondistended, soft and with positive bowel sounds; no guarding.  Right lower quadrant  ileostomy in place demonstrating ileostomy dermatitis with parastomal hernia. Central nervous system: No focal neurological deficits. Extremities: No cyanosis or clubbing; trace edema appreciated bilaterally. Skin: No petechiae. Psychiatry: Mood & affect appropriate.   Data Reviewed: CXR: worsening infiltrates right > left, increase vascular congestion. Basic metabolic panel: Sodium 129, potassium 5.7, chloride 97, bicarb 24, glucose 342, BUN 50, creatinine 1.86 and GFR 40. CBC: White blood cells 17.8, hemoglobin 12.1, MCV 105.9 and platelet count 250 K ABG: pH 7.28/pCO2 56/pO2 65 and HCO3 26.3   Family Communication: no family at the bedside   Disposition: Status is: Inpatient Remains inpatient appropriate because: respiratory failure    Planned Discharge Destination: Home  CRITICAL CARE Performed by: Vassie Loll   Total critical care time: 60 minutes  Critical care time was exclusive of separately billable procedures and treating other patients.  Critical care was necessary to treat or prevent imminent or life-threatening deterioration.  Critical care was time spent personally by me on the following activities: development of treatment plan with patient and/or surrogate as well as nursing, discussions with consultants, evaluation of patient's response to treatment, examination of patient, obtaining history from patient or surrogate, ordering and performing treatments and interventions, ordering and review of laboratory studies, ordering and review of radiographic studies, pulse oximetry and re-evaluation of patient's condition.   Author: Vassie Loll, MD 12/25/2022 12:29 PM  For on call review [email or web address].

## 2022-12-25 NOTE — Progress Notes (Signed)
eLink Physician-Brief Progress Note Patient Name: Luis [name] DOB: January 11, 1961 MRN: [id number]   Date of Service  12/25/2022  HPI/Events of Note  LA 2, up from 1.2 Camera: In synchrony with Vent. VS stable. On propofol, fentanyl drip  AHRF/COPD/PNA  eICU Interventions  Follow LA in AM     Intervention Category Intermediate Interventions: Other:  Ranee Gosselin 12/25/2022, 9:29 PM

## 2022-12-25 NOTE — Progress Notes (Signed)
Pharmacy Antibiotic Note  Luis [name] is a 62 y.o. male admitted on 12/23/2022 with pneumonia.  Pharmacy has been consulted for vancomycin dosing.  Plan: Vancomycin 1250 IV every 24 hours.  Goal trough 15-20 mcg/mL.  Calculated AUC 524 based on Scr 1.86. F/u MRSA PCR swab that is pending.  Height: 6\' 1"  (185.4 cm) Weight: 112.4 kg (247 lb 11.2 oz) IBW/kg (Calculated) : 79.9  Temp (24hrs), Avg:98 F (36.7 C), Min:97.8 F (36.6 C), Max:98.1 F (36.7 C)  Recent Labs  Lab 12/23/22 1325 12/23/22 1816 12/23/22 2219 12/24/22 0041 12/24/22 0645 12/24/22 0836 12/24/22 1832 12/25/22 0450 12/25/22 1308  WBC 14.2*  --   --  12.6*  --   --   --  17.8*  --   CREATININE 0.82  --   --   --  1.64* 1.77* 1.94* 1.86*  --   LATICACIDVEN  --  1.5 1.4  --   --   --   --   --  1.5    Estimated Creatinine Clearance: 53.4 mL/min (A) (by C-G formula based on SCr of 1.86 mg/dL (H)).    Allergies  Allergen Reactions   Lisinopril     Other reaction(s): renal effects   Codeine Other (See Comments) and Hives    unknown Other reaction(s): Other (See Comments) unknown unknown    Antimicrobials this admission: 7/13 unasyn >> 7/12 doxycyline >>7/12 7/14 Vancomycin 7/14 Azithro>   Dose adjustments this admission:  Microbiology results: 7/14 MRSA sent  Thank you for allowing pharmacy to be a part of this patient's care.  Reece Leader, Colon Flattery, BCCP Clinical Pharmacist  12/25/2022 2:49 PM   [facility] pharmacy phone numbers are listed on [email or web address]

## 2022-12-25 NOTE — Progress Notes (Signed)
Approximately 12:10-- This RN entered pt's room and noted that his HFNC was off. SpO2 reading between 78-79%. Pt stated that he took HFNC off to itch his nose. This RN placed pt's HFNC back on pt's face and advised him to take deep breaths. Charge RN, Trey Paula, and RT notified and came to bedside. RT placed pt on BIPAP.  Pt's SpO2 sats took several minutes to recover, currently holding at 87% SpO2. Rapid RN notified.   MD [facility] notified. Pt appears to have worsening, labored breathing when compared to this AM. Pt remains short of breath and restless.  PCCM consulted and assessed pt at bedside. Transfer order placed to 2H ICU d/t concerns of worsening respiratory status. This RN called report and provided handoff to oncoming RN. All questions answered at this time. Pt transferred to 2H on BIPAP by this RN, SWOT RN, and RT. Telemetry notified.

## 2022-12-26 ENCOUNTER — Inpatient Hospital Stay (HOSPITAL_COMMUNITY): Payer: Medicaid Other

## 2022-12-26 DIAGNOSIS — R609 Edema, unspecified: Secondary | ICD-10-CM

## 2022-12-26 DIAGNOSIS — J984 Other disorders of lung: Secondary | ICD-10-CM | POA: Diagnosis not present

## 2022-12-26 DIAGNOSIS — J9601 Acute respiratory failure with hypoxia: Secondary | ICD-10-CM | POA: Diagnosis not present

## 2022-12-26 DIAGNOSIS — Z4682 Encounter for fitting and adjustment of non-vascular catheter: Secondary | ICD-10-CM | POA: Diagnosis not present

## 2022-12-26 DIAGNOSIS — R918 Other nonspecific abnormal finding of lung field: Secondary | ICD-10-CM | POA: Diagnosis not present

## 2022-12-26 LAB — GLUCOSE, CAPILLARY
Glucose-Capillary: 181 mg/dL — ABNORMAL HIGH (ref 70–99)
Glucose-Capillary: 169 mg/dL — ABNORMAL HIGH (ref 70–99)
Glucose-Capillary: 190 mg/dL — ABNORMAL HIGH (ref 70–99)
Glucose-Capillary: 230 mg/dL — ABNORMAL HIGH (ref 70–99)
Glucose-Capillary: 209 mg/dL — ABNORMAL HIGH (ref 70–99)
Glucose-Capillary: 222 mg/dL — ABNORMAL HIGH (ref 70–99)

## 2022-12-26 LAB — MAGNESIUM
Magnesium: 2.4 mg/dL (ref 1.7–2.4)
Magnesium: 2.1 mg/dL (ref 1.7–2.4)

## 2022-12-26 LAB — BASIC METABOLIC PANEL
Anion gap: 10 (ref 5–15)
BUN: 54 mg/dL — ABNORMAL HIGH (ref 8–23)
CO2: 23 mmol/L (ref 22–32)
Calcium: 9.2 mg/dL (ref 8.9–10.3)
Chloride: 102 mmol/L (ref 98–111)
Creatinine, Ser: 1.41 mg/dL — ABNORMAL HIGH (ref 0.61–1.24)
GFR, Estimated: 56 mL/min — ABNORMAL LOW (ref >60)
Glucose, Bld: 163 mg/dL — ABNORMAL HIGH (ref 70–99)
Potassium: 4.5 mmol/L (ref 3.5–5.1)
Sodium: 135 mmol/L (ref 135–145)

## 2022-12-26 LAB — PROCALCITONIN: Procalcitonin: <0.1 ng/mL

## 2022-12-26 LAB — LACTIC ACID, PLASMA: Lactic Acid, Venous: 1.5 mmol/L (ref 0.5–1.9)

## 2022-12-26 LAB — MYCOPLASMA PNEUMONIAE ANTIBODY, IGM: Mycoplasma pneumo IgM: <770 U/mL (ref 0–769)

## 2022-12-26 LAB — CBC
HCT: 33.9 % — ABNORMAL LOW (ref 39.0–52.0)
Hemoglobin: 10.9 g/dL — ABNORMAL LOW (ref 13.0–17.0)
MCH: 34.8 pg — ABNORMAL HIGH (ref 26.0–34.0)
MCHC: 32.2 g/dL (ref 30.0–36.0)
MCV: 108.3 fL — ABNORMAL HIGH (ref 80.0–100.0)
Platelets: 196 10*3/uL (ref 150–400)
RBC: 3.13 MIL/uL — ABNORMAL LOW (ref 4.22–5.81)
RDW: 13.7 % (ref 11.5–15.5)
WBC: 10.9 10*3/uL — ABNORMAL HIGH (ref 4.0–10.5)
nRBC: 0 % (ref 0.0–0.2)

## 2022-12-26 LAB — CULTURE, RESPIRATORY W GRAM STAIN: Gram Stain: NONE SEEN

## 2022-12-26 LAB — PHOSPHORUS
Phosphorus: 4.1 mg/dL (ref 2.5–4.6)
Phosphorus: 4.5 mg/dL (ref 2.5–4.6)

## 2022-12-26 LAB — TRIGLYCERIDES: Triglycerides: 522 mg/dL — ABNORMAL HIGH (ref <150)

## 2022-12-26 LAB — C-REACTIVE PROTEIN: CRP: 0.8 mg/dL (ref <1.0)

## 2022-12-26 MED ORDER — HYDRALAZINE HCL 20 MG/ML IJ SOLN: 10.0000 mg | Freq: Four times a day (QID) | INTRAMUSCULAR | Status: DC | PRN

## 2022-12-26 MED ORDER — FOLIC ACID 1 MG PO TABS
1.0000 mg | ORAL_TABLET | Freq: Every day | ORAL | Status: DC
  Administered 2022-12-26 – 2022-12-27 (×2): 1 mg
  Filled 2022-12-26 (×2): qty 1

## 2022-12-26 MED ORDER — PHENOBARBITAL SODIUM 65 MG/ML IJ SOLN
65.0000 mg | Freq: Every day | INTRAMUSCULAR | Status: AC
  Administered 2022-12-28: 65 mg via INTRAVENOUS
  Filled 2022-12-26: qty 1

## 2022-12-26 MED ORDER — VITAMIN C 500 MG PO TABS
500.0000 mg | ORAL_TABLET | Freq: Every day | ORAL | Status: DC
  Administered 2022-12-26 – 2022-12-27 (×2): 500 mg
  Filled 2022-12-26 (×2): qty 1

## 2022-12-26 MED ORDER — VITAL 1.5 CAL PO LIQD
1000.0000 mL | ORAL | Status: DC
  Administered 2022-12-26: 1000 mL

## 2022-12-26 MED ORDER — PROSOURCE TF20 ENFIT COMPATIBL EN LIQD
60.0000 mL | Freq: Two times a day (BID) | ENTERAL | Status: DC
  Administered 2022-12-26: 60 mL
  Filled 2022-12-26 (×2): qty 60

## 2022-12-26 MED ORDER — INSULIN GLARGINE-YFGN 100 UNIT/ML ~~LOC~~ SOLN
14.0000 [IU] | Freq: Two times a day (BID) | SUBCUTANEOUS | Status: DC
  Administered 2022-12-26 (×2): 14 [IU] via SUBCUTANEOUS
  Filled 2022-12-26 (×4): qty 0.14

## 2022-12-26 MED ORDER — PHENOBARBITAL SODIUM 130 MG/ML IJ SOLN
130.0000 mg | Freq: Three times a day (TID) | INTRAMUSCULAR | Status: AC
  Administered 2022-12-26 – 2022-12-27 (×3): 130 mg via INTRAVENOUS
  Filled 2022-12-26 (×3): qty 1

## 2022-12-26 MED ORDER — ZINC SULFATE 220 (50 ZN) MG PO CAPS
220.0000 mg | ORAL_CAPSULE | Freq: Every day | ORAL | Status: DC
  Administered 2022-12-26 – 2022-12-27 (×2): 220 mg
  Filled 2022-12-26 (×2): qty 1

## 2022-12-26 MED ORDER — MIDAZOLAM HCL 2 MG/2ML IJ SOLN
1.0000 mg | INTRAMUSCULAR | Status: DC | PRN
  Administered 2022-12-26: 2 mg via INTRAVENOUS
  Administered 2022-12-27 – 2022-12-28 (×3): 1 mg via INTRAVENOUS
  Filled 2022-12-26 (×8): qty 2

## 2022-12-26 MED ORDER — DEXMEDETOMIDINE HCL IN NACL 400 MCG/100ML IV SOLN
0.0000 ug/kg/h | INTRAVENOUS | Status: DC
  Administered 2022-12-26: 1.2 ug/kg/h via INTRAVENOUS
  Administered 2022-12-26: 0.4 ug/kg/h via INTRAVENOUS
  Administered 2022-12-26 (×2): 1.2 ug/kg/h via INTRAVENOUS
  Administered 2022-12-27: 1 ug/kg/h via INTRAVENOUS
  Administered 2022-12-27: 1.2 ug/kg/h via INTRAVENOUS
  Administered 2022-12-27: 0.8 ug/kg/h via INTRAVENOUS
  Administered 2022-12-27: 1 ug/kg/h via INTRAVENOUS
  Administered 2022-12-27 (×2): 1.2 ug/kg/h via INTRAVENOUS
  Administered 2022-12-27: 1 ug/kg/h via INTRAVENOUS
  Administered 2022-12-28: 1.2 ug/kg/h via INTRAVENOUS
  Administered 2022-12-28: 0.6 ug/kg/h via INTRAVENOUS
  Filled 2022-12-26 (×13): qty 100

## 2022-12-26 MED ORDER — PHENOBARBITAL SODIUM 65 MG/ML IJ SOLN
65.0000 mg | Freq: Three times a day (TID) | INTRAMUSCULAR | Status: AC
  Administered 2022-12-27 – 2022-12-28 (×3): 65 mg via INTRAVENOUS
  Filled 2022-12-26 (×3): qty 1

## 2022-12-26 MED ORDER — ADULT MULTIVITAMIN LIQUID CH
15.0000 mL | Freq: Every day | ORAL | Status: DC
  Administered 2022-12-26 – 2022-12-27 (×2): 15 mL
  Filled 2022-12-26 (×2): qty 15

## 2022-12-26 MED ORDER — SODIUM CHLORIDE 0.9 % IV SOLN
260.0000 mg | Freq: Once | INTRAVENOUS | Status: AC
  Administered 2022-12-26: 260 mg via INTRAVENOUS
  Filled 2022-12-26: qty 2

## 2022-12-26 NOTE — Progress Notes (Signed)
Bilateral lower extremity venous duplex has been completed. Preliminary results can be found in CV Proc through chart review.   12/26/22 12:00 PM Olen Cordial RVT

## 2022-12-26 NOTE — Plan of Care (Signed)
  Problem: Respiratory: Goal: Ability to maintain a clear airway will improve Outcome: Progressing Goal: Levels of oxygenation will improve Outcome: Progressing Goal: Ability to maintain adequate ventilation will improve Outcome: Progressing   

## 2022-12-26 NOTE — TOC Initial Note (Signed)
Transition of Care [facility]) - Initial/Assessment Note    Patient Details  Name: Luis [name] MRN: [id number] Date of Birth: [birth year]  Transition [facility] CM/SW Contact:    Elliot Cousin, RN Phone Number: [phone] 12/26/2022, 4:47 PM  Clinical Narrative:    TOC CM contacted pt's son. States pt lives at home alone. [place] and his Celine Ahr is local. Pt has Meals on Wheels and he will place on hold. Pt uses [employer] for transportation. Will continue to follow for dc needs.             Expected Discharge Plan: Skilled Nursing Facility Barriers to Discharge: Continued Medical Work up   Patient Goals and CMS Choice    Expected Discharge Plan and Services   Discharge Planning Services: CM Consult   Living arrangements for the past 2 months: Single Family Home                  Prior Living Arrangements/Services Living arrangements for the past 2 months: Single Family Home Lives with:: Self              Current home services: DME (rolling walker)    Activities of Daily Living Home Assistive Devices/Equipment: None ADL Screening (condition at time of admission) Patient's cognitive ability adequate to safely complete daily activities?: No Is the patient deaf or have difficulty hearing?: Yes Does the patient have difficulty seeing, even when wearing glasses/contacts?: No Does the patient have difficulty concentrating, remembering, or making decisions?: Yes Patient able to express need for assistance with ADLs?: Yes Does the patient have difficulty dressing or bathing?: Yes Independently performs ADLs?: No Communication: Independent Does the patient have difficulty walking or climbing stairs?: Yes Weakness of Legs: Both Weakness of Arms/Hands: Both  Permission Sought/Granted                  Emotional Assessment   Attitude/Demeanor/Rapport: Intubated (Following Commands or Not Following Commands)           Admission diagnosis:  CHF (congestive heart failure) (HCC) [I50.9] Acute respiratory failure with hypoxia (HCC) [J96.01] Patient Active Problem List   Diagnosis Date Noted   Community acquired pneumonia 12/24/2022   AKI (acute kidney injury) (HCC) 12/24/2022   Class 1 obesity 12/24/2022   Skin rash 12/24/2022   Acute respiratory failure with hypoxia (HCC) 12/23/2022   Acute on chronic heart failure with preserved ejection fraction (HFpEF) (HCC) 12/23/2022   Skin cancer screening 12/09/2022   Dermatitis 12/09/2022   Encounter for smoking cessation counseling 12/09/2022   Elevated liver enzymes 12/09/2022   Prostate cancer screening 12/09/2022   Leukocytosis 12/09/2022   Alcoholism (HCC) 12/09/2022   Abdominal wall hernia at previous stoma site 12/02/2022   Nonhealing surgical wound, subsequent encounter 11/24/2022   Abdominal hernia without obstruction and without gangrene 11/22/2022   Class 1 obesity with serious comorbidity and body mass index (BMI) of 32.0 to 32.9 in adult 11/04/2022   Pain in both hands 11/04/2022   Insomnia 11/04/2022   Snoring 11/04/2022   Elevated hemoglobin (HCC) 11/04/2022   Smoker 11/04/2022   Irritant contact dermatitis associated with digestive stoma 11/02/2022   Ileostomy prolapse (HCC) 08/18/2022   Ileostomy in place [facility]) 08/08/2022   Bilateral inguinal hernia without obstruction or gangrene 08/08/2022   Surgical wound, non healing 04/26/2022   Ileostomy dysfunction (HCC) 04/12/2022   Non-recurrent bilateral inguinal hernia without obstruction or gangrene 03/22/2022   Incisional hernia, without  obstruction or gangrene    Nonhealing nonsurgical wound    Ileostomy stenosis (HCC)    Irritant contact dermatitis associated with fecal stoma    Ileostomy care [facility])    Pacemaker    AV block, 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 [address] [facility] [address] [place] [address] Phone: [phone] Fax: [phone]     Social Determinants of Health (SDOH) Social History: SDOH Screenings   Food Insecurity: Patient Unable To Answer (12/23/2022)  Housing: High Risk (12/23/2022)  Transportation Needs: Patient Unable To Answer (12/23/2022)  Utilities: Patient Unable To Answer (12/23/2022)  Depression (PHQ2-9): Low Risk  (11/04/2022)  Tobacco Use: High Risk (12/23/2022)   SDOH Interventions:     Readmission Risk Interventions     No data to display

## 2022-12-26 NOTE — Progress Notes (Addendum)
Pt placed back on full vent support due to pt desat to 86%. Pt tolerating well at this time, vitals stable, RN at bedside, CCM aware, RT will monitor as needed.

## 2022-12-26 NOTE — Progress Notes (Signed)
NAME:  Luis [name], MRN:  [id number], DOB:  April 04, 1961, LOS: 3 ADMISSION DATE:  12/23/2022, CONSULTATION DATE:  7/14  REFERRING MD:  Gwenlyn Perking , CHIEF COMPLAINT: pneumonia    History of Present Illness:  62 year old male with history as below including HFpEF, AV block s/p PPM, ETOH (pint vodka/day), and poorly healing abdominal wound. Admitted with SOB felt to be due to PNA. Started ABX. Increasing O2 requirements, then BiPAP. Then intubated and transferred to the ICU 7/14.  Pertinent  Medical History  AV block w/ PPM, HTN, type II DM, ETOH abuse (pint of vodka/d) HFpEF, HL, DVT, diverticulitis, ileostomy, non-healing abd wound followed in wound clinic abd hernia  Significant Hospital Events: Including procedures, antibiotic start and stop dates in addition to other pertinent events   7/12 admitted Placed on supplemental oxygen as well as intermittent BiPAP started initially on doxycycline, then transition to Unasyn for possible aspiration 7/13 still hypoxic still on and off BiPAP.  critical care asked to see  Echocardiogram EF 50 to 55% with low normal function no regional wall abnormality grade 2 diastolic heart dysfunction left atrium mildly dilated normal right ventricular function normal right ventricular size 7/14 Tx to ICU and intubated for high FiO2 requirement and intermittent  need for benzodiazepines for withdrawal   Interim History / Subjective:  Remains intubated. Failed SBT this morning due to agitation and desaturation.   Objective   Blood pressure 131/81, pulse 93, temperature 98.4 F (36.9 C), resp. rate (!) 28, height 6\' 1"  (1.854 m), weight 107.2 kg, SpO2 97%.    Vent Mode: PRVC FiO2 (%):  [60 %-100 %] 60 % Set Rate:  [18 bmp-28 bmp] 28 bmp Vt Set:  [560 mL] 560 mL PEEP:  [8 cmH20-12 cmH20] 8 cmH20 Plateau Pressure:  [19 cmH20-23 cmH20] 19 cmH20   Intake/Output Summary (Last 24 hours) at 12/26/2022 0753 Last data filed at 12/26/2022 0656 Gross per 24 hour  Intake  1916.63 ml  Output 2645 ml  Net -728.37 ml   Filed Weights   12/24/22 0413 12/25/22 0600 12/26/22 0200  Weight: 114.4 kg 112.4 kg 107.2 kg    Examination: General: Chronically ill appearing middle aged male on vent.  HENT: ETT, [place]/AT, PERRL.  Lungs: Diminished bases, vent support.  Cardiovascular: RRR, no MRG Abdomen: Soft, mildly distended. Dressing in place, clean and dry. Ileostomy draining as expected.  Extremities: Trace edema. No deformity. Cap refill brisk Neuro: Cyclical agitation/sedation. Does arouse to follow commands intermittently. Examined on prop/fentanyl.   Echo 7/13 > LVEF 50-55%, Grade 2 DD.  LE doppler 7/15 >>>  Resolved Hospital Problem list     Assessment & Plan:   Acute hypoxic and hypercarbic respiratory failure in the setting of pneumonia, superimposed on chronic obstructive pulmonary disease.  Presuming aspiration versus CAP. Cxr worse. Not certain however that his CXR explains his degree of hypoxia, BNP 1400, Procal 0.14, D Dimer 0.65, LE dopplers >>> Plan - Full vent support - Ongoing diuresis - Day 3 unasyn, day 2 azithro/vanco.  - Continue scheduled bronchodilators and inhaled corticosteroids - Systemic steroids as well.  - LE dopplers pending - Propofol and fentanyl for RASS goal -1 to -2 - Consider precedex if WUA continue to be an issue.  - Daily SBT  Acute diastolic heart failure with element of pulmonary edema - IV diuresis - Holding Norvasc for now  AKI.  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  Typically followed at wound ostomy clinic.  Has history of prior ileostomy Plan Continue current wound care as outlined by wound ostomy nurse which includes daily cleansing and soaking of affected areas to be followed by gentle pat dry then Vashe moistened gauze applied with ABD and a security bandage over that   Best Practice (right click and "Reselect all SmartList Selections" daily)   Diet/type: NPO DVT prophylaxis: LMWH GI prophylaxis: N/A Lines: N/A Foley:  N/A Code Status:  full code Last date of multidisciplinary goals of care discussion [pending]   Critical care time: 43 minutes      Joneen Roach, AGACNP-BC [facility]  See Amion for personal pager PCCM on call pager [phone] until 7pm. Please call Elink 7p-7a. [phone]  12/26/2022 8:08 AM

## 2022-12-26 NOTE — Consult Note (Signed)
WOC Nurse ostomy consult note Patient known to our team from ostomy outpatient clinic visits and follow up.  Consult performed on 12/24/22 for ostomy care orders and supply numbers and wound care orders. Those are currently active and current on the EMR.  WOC nursing team will not follow, but will remain available to this patient, the nursing and medical teams.  Please re-consult if needed.  Thank you for inviting [place] to participate in this patient's Plan of Care.  Ladona Mow, MSN, RN, CNS, GNP, Leda Min, [employer], [employer] phone:  [phone]

## 2022-12-26 NOTE — Progress Notes (Signed)
Initial Nutrition Assessment  DOCUMENTATION CODES:   Not applicable  INTERVENTION:   Tube Feeding via OG: Vital 1.5 at 60 ml/hr Begin TF at rate of 20 ml/hr; titrate by 10 mL q 8 hours until goal rate of 60 ml/hr Pro-source TF20 60 mL BID TF at goal provides 137 g of protein, 2320 kcals and 1094 mL of free water  Continue IV thiamine. Add folic acid 1 mg daily and MVI with Minerals daily  Pt is at high risk for multiple vitamin deficiencies. Recommend checking CRP, Vitamin C, Vitamin A, Zinc. Plan to go ahead and initiate Vitamin C and Zinc supplementation given low risk for toxicity. Further recommendations pending results  Monitor magnesium, potassium, and phosphorus BID for at least 3 days, MD to replete as needed, as pt is at risk for refeeding syndrome.   NUTRITION DIAGNOSIS:   Inadequate oral intake related to acute illness as evidenced by NPO status.  GOAL:   Patient will meet greater than or equal to 90% of their needs   MONITOR:   Vent status, Labs, Weight trends, TF tolerance, Skin  REASON FOR ASSESSMENT:   Ventilator    ASSESSMENT:   62 yo male admitted with acute respiratory failure in setting of pneumonia and superimposed on COPD, acute diastolic HF, AKI, possible EtOH withdrawl.  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  7/12 Admitted 7/14 Intubated  Pt on vent support, awake on visit today. RN reports pt gets worked up with minimal stimulation. Failed SBT. Deferred nutrition focused physical exam at this time  OG tube in stomach per abd xray report.  +ileostomy, +stool output Abdomen distended; pt with hx very large incision ventral hernia  RD observed abdominal wound during RN assessment. Foul smelling, not much drainage per  RN. Also noted wound picture from 12/05/22. Wound appears to cover the entire abdomen. WOC consulted, pt is followed by outpatient Wound/Ostomy clinic.   In review of pt encounters/recent visits with surgery team at [facility], noted concern by MD that abdominal wound is concerning for squamous cell carcinoma and plans for Oncology to review, unclear if this has occurred. Discussed with RN and PCCM  Current wt 107.2 kg. Previous weight encounters appears relatively stable with fluctuations up and down over the past year.   Unable to obtain diet and weight history from patient at this time  Labs: Creatinine 1.41, BUN 54 Meds: lasix, ss novolog, semglee   NUTRITION - FOCUSED PHYSICAL EXAM:  Deferred until follow-up  Diet Order:   Diet Order             Diet NPO time specified  Diet effective now                   EDUCATION NEEDS:   Not appropriate for education at this time  Skin:  Skin Assessment: Skin Integrity Issues: Skin Integrity Issues:: Other (Comment) Other: non pressure wound to entire abdomen, chronic (had for years), possible eval for skin cancer per [facility] MD  Last BM:  +stool via ileostomy  Height:   Ht Readings from Last 1 Encounters:  12/25/22 6\' 1"  (1.854 m)    Weight:   Wt Readings from Last 1 Encounters:  12/26/22 107.2 kg    BMI:  Body mass index is 31.18 kg/m.  Estimated Nutritional Needs:   Kcal:  2200-2400 kcals  Protein:  125-150 g  Fluid:  2L    Romelle Starcher MS, RDN, LDN, CNSC Registered Dietitian 3 Clinical Nutrition RD Pager and On-Call Pager Number Located in [place]

## 2022-12-26 NOTE — Progress Notes (Signed)
Resp culture collected and sent to Lab by RT. RT will monitor as needed.

## 2022-12-27 ENCOUNTER — Ambulatory Visit (HOSPITAL_COMMUNITY): Payer: Medicaid Other | Admitting: Nurse Practitioner

## 2022-12-27 DIAGNOSIS — J810 Acute pulmonary edema: Secondary | ICD-10-CM

## 2022-12-27 DIAGNOSIS — J9601 Acute respiratory failure with hypoxia: Secondary | ICD-10-CM | POA: Diagnosis not present

## 2022-12-27 DIAGNOSIS — F10931 Alcohol use, unspecified with withdrawal delirium: Secondary | ICD-10-CM

## 2022-12-27 HISTORY — DX: Alcohol use, unspecified with withdrawal delirium: F10.931

## 2022-12-27 LAB — BASIC METABOLIC PANEL
Anion gap: 10 (ref 5–15)
BUN: 39 mg/dL — ABNORMAL HIGH (ref 8–23)
CO2: 27 mmol/L (ref 22–32)
Calcium: 9.2 mg/dL (ref 8.9–10.3)
Chloride: 101 mmol/L (ref 98–111)
Creatinine, Ser: 1.03 mg/dL (ref 0.61–1.24)
GFR, Estimated: >60 mL/min (ref >60)
Glucose, Bld: 199 mg/dL — ABNORMAL HIGH (ref 70–99)
Potassium: 3.8 mmol/L (ref 3.5–5.1)
Sodium: 138 mmol/L (ref 135–145)

## 2022-12-27 LAB — GLUCOSE, CAPILLARY
Glucose-Capillary: 191 mg/dL — ABNORMAL HIGH (ref 70–99)
Glucose-Capillary: 215 mg/dL — ABNORMAL HIGH (ref 70–99)
Glucose-Capillary: 137 mg/dL — ABNORMAL HIGH (ref 70–99)
Glucose-Capillary: 110 mg/dL — ABNORMAL HIGH (ref 70–99)

## 2022-12-27 LAB — CBC
HCT: 40.3 % (ref 39.0–52.0)
Hemoglobin: 13.5 g/dL (ref 13.0–17.0)
MCH: 35.4 pg — ABNORMAL HIGH (ref 26.0–34.0)
MCHC: 33.5 g/dL (ref 30.0–36.0)
MCV: 105.8 fL — ABNORMAL HIGH (ref 80.0–100.0)
Platelets: 206 10*3/uL (ref 150–400)
RBC: 3.81 MIL/uL — ABNORMAL LOW (ref 4.22–5.81)
RDW: 13.6 % (ref 11.5–15.5)
WBC: 11.6 10*3/uL — ABNORMAL HIGH (ref 4.0–10.5)
nRBC: 0 % (ref 0.0–0.2)

## 2022-12-27 LAB — PHOSPHORUS
Phosphorus: 3.6 mg/dL (ref 2.5–4.6)
Phosphorus: 3.8 mg/dL (ref 2.5–4.6)

## 2022-12-27 LAB — MAGNESIUM
Magnesium: 2 mg/dL (ref 1.7–2.4)
Magnesium: 1.7 mg/dL (ref 1.7–2.4)

## 2022-12-27 LAB — PROCALCITONIN: Procalcitonin: <0.1 ng/mL

## 2022-12-27 MED ORDER — ACETAMINOPHEN 325 MG PO TABS
650.0000 mg | ORAL_TABLET | Freq: Four times a day (QID) | ORAL | Status: DC | PRN
  Administered 2022-12-27 – 2022-12-31 (×3): 650 mg via NASOGASTRIC
  Filled 2022-12-27 (×3): qty 2

## 2022-12-27 MED ORDER — LORAZEPAM 1 MG PO TABS: 1.0000 mg | ORAL_TABLET | ORAL | Status: AC | PRN

## 2022-12-27 MED ORDER — INSULIN GLARGINE-YFGN 100 UNIT/ML ~~LOC~~ SOLN
18.0000 [IU] | Freq: Two times a day (BID) | SUBCUTANEOUS | Status: DC
  Administered 2022-12-27 – 2023-01-01 (×11): 18 [IU] via SUBCUTANEOUS
  Filled 2022-12-27 (×12): qty 0.18

## 2022-12-27 MED ORDER — LORAZEPAM 2 MG/ML IJ SOLN
1.0000 mg | INTRAMUSCULAR | Status: AC | PRN
  Administered 2022-12-27 – 2022-12-29 (×4): 2 mg via INTRAVENOUS
  Filled 2022-12-27 (×5): qty 1

## 2022-12-27 MED ORDER — ADULT MULTIVITAMIN W/MINERALS CH
1.0000 | ORAL_TABLET | Freq: Every day | ORAL | Status: DC
  Administered 2022-12-27 – 2023-01-01 (×6): 1 via ORAL
  Filled 2022-12-27 (×6): qty 1

## 2022-12-27 MED ORDER — ENSURE ENLIVE PO LIQD
237.0000 mL | Freq: Three times a day (TID) | ORAL | Status: DC
  Administered 2022-12-28 – 2023-01-01 (×7): 237 mL via ORAL

## 2022-12-27 MED ORDER — LORAZEPAM 2 MG/ML IJ SOLN
INTRAMUSCULAR | Status: AC
  Administered 2022-12-27: 2 mg via INTRAVENOUS
  Filled 2022-12-27: qty 1

## 2022-12-27 MED ORDER — HALOPERIDOL LACTATE 5 MG/ML IJ SOLN
5.0000 mg | Freq: Four times a day (QID) | INTRAMUSCULAR | Status: DC | PRN
  Administered 2022-12-28: 5 mg via INTRAVENOUS
  Filled 2022-12-27: qty 1

## 2022-12-27 MED ORDER — HALOPERIDOL LACTATE 5 MG/ML IJ SOLN
INTRAMUSCULAR | Status: AC
  Administered 2022-12-27: 5 mg via INTRAVENOUS
  Filled 2022-12-27: qty 1

## 2022-12-27 MED ORDER — ORAL CARE MOUTH RINSE: 15.0000 mL | OROMUCOSAL | Status: DC | PRN

## 2022-12-27 MED ORDER — LORAZEPAM 2 MG/ML IJ SOLN: 2.0000 mg | Freq: Once | INTRAMUSCULAR | Status: AC

## 2022-12-27 MED ORDER — ORAL CARE MOUTH RINSE
15.0000 mL | OROMUCOSAL | Status: DC
  Administered 2022-12-28 – 2022-12-29 (×4): 15 mL via OROMUCOSAL

## 2022-12-27 NOTE — Procedures (Signed)
Extubation Procedure Note  Patient Details:   Name: Luis [name] DOB: 19-Mar-1961 MRN: [id number]   Airway Documentation:    Vent end date: 12/27/22 Vent end time: 0830   Evaluation  O2 sats: stable throughout Complications: No apparent complications Patient did tolerate procedure well. Bilateral Breath Sounds: Clear, Diminished   Yes  Pt extubated to 2L [name], pt tolerated well. Cuff leak present, No stridor noted, vitals stable, RN at bedside, RT will monitor as needed.   Thornell Mule 12/27/2022, 9:37 AM

## 2022-12-27 NOTE — Progress Notes (Signed)
Brief Nutrition Follow-up:  Pt extubated this AM, diet advanced to Heart Healthy. Per RN, pt ate about 20% at lunch and requires feeding assistance.   Later in the day, RN notified RD that pt with worsening mental status, back on precedex drip, adding ativan. not currently safe for po.   1) Recommend liberalizing diet to Regular with addition of Ensure Enlive TID. 2) Pt requires feeding assistance at meal times 3) If pt unable to take po, recommend considering Cortrak placement. 4) Micronutrient labs still pending  Romelle Starcher MS, RDN, LDN, CNSC Registered Dietitian 3 Clinical Nutrition RD Pager and On-Call Pager Number Located in [place]

## 2022-12-27 NOTE — Progress Notes (Signed)
PCCM INTERVAL PROGRESS NOTE   Extubated to 2L [place]. Tolerating well. Having some confusion delirium. Lingering sedation vs ETOH withdrawal.  Precedex continue. Phenobarb taper ongoing. Add CIWA ativan.   Try to wean dex off today.    Joneen Roach, AGACNP-BC [facility]  See Amion for personal pager PCCM on call pager [phone] until 7pm. Please call Elink 7p-7a. [phone]  12/27/2022 9:39 AM

## 2022-12-27 NOTE — Progress Notes (Signed)
NAME:  Luis [name], MRN:  [id number], DOB:  1960/08/24, LOS: 4 ADMISSION DATE:  12/23/2022, CONSULTATION DATE:  7/14  REFERRING MD:  Gwenlyn Perking , CHIEF COMPLAINT: pneumonia    History of Present Illness:  62 year old male with history as below including HFpEF, AV block s/p PPM, ETOH (pint vodka/day), and poorly healing abdominal wound. Admitted with SOB felt to be due to PNA. Started ABX. Increasing O2 requirements, then BiPAP. Then intubated and transferred to the ICU 7/14.  Pertinent  Medical History  AV block w/ PPM, HTN, type II DM, ETOH abuse (pint of vodka/d) HFpEF, HL, DVT, diverticulitis, ileostomy, non-healing abd wound followed in wound clinic abd hernia  Significant Hospital Events: Including procedures, antibiotic start and stop dates in addition to other pertinent events   7/12 admitted Placed on supplemental oxygen as well as intermittent BiPAP started initially on doxycycline, then transition to Unasyn for possible aspiration 7/13 still hypoxic still on and off BiPAP.  critical care asked to see  Echocardiogram EF 50 to 55% with low normal function no regional wall abnormality grade 2 diastolic heart dysfunction left atrium mildly dilated normal right ventricular function normal right ventricular size 7/14 Tx to ICU and intubated for high FiO2 requirement and intermittent  need for benzodiazepines for withdrawal   Interim History / Subjective:  Agitation better controlled overnight Low grade fever Remains on vent  Objective   Blood pressure (!) 140/96, pulse 93, temperature 100 F (37.8 C), resp. rate (!) 28, height 6\' 1"  (1.854 m), weight 102.6 kg, SpO2 96%.    Vent Mode: PRVC FiO2 (%):  [40 %-60 %] 40 % Set Rate:  [28 bmp] 28 bmp Vt Set:  [560 mL] 560 mL PEEP:  [5 cmH20-8 cmH20] 5 cmH20 Plateau Pressure:  [17 cmH20-19 cmH20] 17 cmH20   Intake/Output Summary (Last 24 hours) at 12/27/2022 0718 Last data filed at 12/27/2022 0700 Gross per 24 hour  Intake 2952.27 ml   Output 4550 ml  Net -1597.73 ml   Filed Weights   12/25/22 0600 12/26/22 0200 12/27/22 0500  Weight: 112.4 kg 107.2 kg 102.6 kg    Examination:  General: Chronically ill appearing male on vent HENT: ETT, [place]/AT, PERRL, no JVD Lungs: Clear bilateral breath sounds. No wheeze Cardiovascular: RRR, no MRG Abdomen: Soft, distended. Non-tender. Normoactive bowel sounds.  Extremities: No significant edema.  Neuro: Spontaneously awake, alert. Calm.   Echo 7/13 > LVEF 50-55%, Grade 2 DD.  LE doppler 7/15 > Negative  Labs: WBC 11, Hgb 13.5, Chem pending  1.5L negative x 24 hours. 3.3L negative for admission   Resolved Hospital Problem list     Assessment & Plan:   Acute hypoxic and hypercarbic respiratory failure in the setting of pneumonia, superimposed on chronic obstructive pulmonary disease.  Presuming aspiration versus CAP. Not certain however that his CXR explains his degree of hypoxia. BNP 1400, Procal 0.14, D Dimer 0.65, LE dopplers negative Plan - Full vent support - Ongoing diuresis - Day 4 unasyn, day 3 azithro. DC vanco.  - Scheduled nebs - Precedex and fentanyl for RASS goal -1 to -2 - Will wean vent/sedation with hopes of extubation today.   Acute diastolic heart failure with element of pulmonary edema - IV diuresis - Holding Norvasc for now  AKI.  Serum creatinine seems to have stabilized Fluid and electrolyte balance: Hyponatremia, hyperkalemia - AM labs pending - Daily assessment for diuretics  Hyperglycemia with suboptimal control - Plan increase his Semglee to 18 units BID -  Sliding scale to every 4 resistant  Acute delirium with acute alcohol withdrawal - Continuing thiamine and folate - Precedex hope to turn off after extubation - Phenobarb taper. 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   Typically followed at wound ostomy clinic.  Has history of prior ileostomy Plan Continue current wound care as outlined by wound ostomy nurse which includes daily cleansing and soaking of affected areas to be followed by gentle pat dry then Vashe moistened gauze applied with ABD and a security bandage over that   Best Practice (right click and "Reselect all SmartList Selections" daily)   Diet/type: NPO DVT prophylaxis: LMWH GI prophylaxis: N/A Lines: N/A Foley:  N/A D/C Code Status:  full code Last date of multidisciplinary goals of care discussion [pending]   Critical care time: 41 minutes      Joneen Roach, AGACNP-BC [facility]  See Amion for personal pager PCCM on call pager [phone] until 7pm. Please call Elink 7p-7a. [phone]  12/27/2022 7:18 AM

## 2022-12-28 ENCOUNTER — Inpatient Hospital Stay (HOSPITAL_COMMUNITY): Payer: Medicaid Other

## 2022-12-28 DIAGNOSIS — R0902 Hypoxemia: Secondary | ICD-10-CM | POA: Diagnosis not present

## 2022-12-28 DIAGNOSIS — G9340 Encephalopathy, unspecified: Secondary | ICD-10-CM | POA: Diagnosis not present

## 2022-12-28 DIAGNOSIS — J9601 Acute respiratory failure with hypoxia: Secondary | ICD-10-CM | POA: Diagnosis not present

## 2022-12-28 DIAGNOSIS — R140 Abdominal distension (gaseous): Secondary | ICD-10-CM | POA: Diagnosis not present

## 2022-12-28 DIAGNOSIS — R918 Other nonspecific abnormal finding of lung field: Secondary | ICD-10-CM | POA: Diagnosis not present

## 2022-12-28 DIAGNOSIS — Z4682 Encounter for fitting and adjustment of non-vascular catheter: Secondary | ICD-10-CM | POA: Diagnosis not present

## 2022-12-28 HISTORY — DX: Encephalopathy, unspecified: G93.40

## 2022-12-28 LAB — CBC
HCT: 45 % (ref 39.0–52.0)
Hemoglobin: 15 g/dL (ref 13.0–17.0)
MCH: 35.5 pg — ABNORMAL HIGH (ref 26.0–34.0)
MCHC: 33.3 g/dL (ref 30.0–36.0)
MCV: 106.4 fL — ABNORMAL HIGH (ref 80.0–100.0)
Platelets: 217 10*3/uL (ref 150–400)
RBC: 4.23 MIL/uL (ref 4.22–5.81)
RDW: 13.5 % (ref 11.5–15.5)
WBC: 15.9 10*3/uL — ABNORMAL HIGH (ref 4.0–10.5)
nRBC: 0 % (ref 0.0–0.2)

## 2022-12-28 LAB — COMPREHENSIVE METABOLIC PANEL
ALT: 93 U/L — ABNORMAL HIGH (ref 0–44)
AST: 87 U/L — ABNORMAL HIGH (ref 15–41)
Albumin: 3.4 g/dL — ABNORMAL LOW (ref 3.5–5.0)
Alkaline Phosphatase: 111 U/L (ref 38–126)
Anion gap: 11 (ref 5–15)
BUN: 31 mg/dL — ABNORMAL HIGH (ref 8–23)
CO2: 26 mmol/L (ref 22–32)
Calcium: 9 mg/dL (ref 8.9–10.3)
Chloride: 103 mmol/L (ref 98–111)
Creatinine, Ser: 1.1 mg/dL (ref 0.61–1.24)
GFR, Estimated: >60 mL/min (ref >60)
Glucose, Bld: 122 mg/dL — ABNORMAL HIGH (ref 70–99)
Potassium: 3.8 mmol/L (ref 3.5–5.1)
Sodium: 140 mmol/L (ref 135–145)
Total Bilirubin: 1.2 mg/dL (ref 0.3–1.2)
Total Protein: 7.4 g/dL (ref 6.5–8.1)

## 2022-12-28 LAB — GLUCOSE, CAPILLARY
Glucose-Capillary: 149 mg/dL — ABNORMAL HIGH (ref 70–99)
Glucose-Capillary: 117 mg/dL — ABNORMAL HIGH (ref 70–99)
Glucose-Capillary: 120 mg/dL — ABNORMAL HIGH (ref 70–99)
Glucose-Capillary: 125 mg/dL — ABNORMAL HIGH (ref 70–99)
Glucose-Capillary: 222 mg/dL — ABNORMAL HIGH (ref 70–99)
Glucose-Capillary: 178 mg/dL — ABNORMAL HIGH (ref 70–99)
Glucose-Capillary: 156 mg/dL — ABNORMAL HIGH (ref 70–99)

## 2022-12-28 LAB — PROCALCITONIN: Procalcitonin: 0.19 ng/mL

## 2022-12-28 LAB — MAGNESIUM: Magnesium: 1.9 mg/dL (ref 1.7–2.4)

## 2022-12-28 LAB — ZINC: Zinc: 63 ug/dL (ref 44–115)

## 2022-12-28 LAB — VITAMIN A: Vitamin A (Retinoic Acid): 103 ug/dL — ABNORMAL HIGH (ref 22.0–69.5)

## 2022-12-28 LAB — CULTURE, RESPIRATORY W GRAM STAIN

## 2022-12-28 LAB — PHOSPHORUS: Phosphorus: 3.8 mg/dL (ref 2.5–4.6)

## 2022-12-28 MED ORDER — CLONIDINE HCL 0.2 MG PO TABS
0.2000 mg | ORAL_TABLET | Freq: Four times a day (QID) | ORAL | Status: DC
  Administered 2022-12-28 – 2022-12-29 (×5): 0.2 mg via ORAL
  Filled 2022-12-28 (×2): qty 1
  Filled 2022-12-28: qty 2
  Filled 2022-12-28 (×2): qty 1

## 2022-12-28 MED ORDER — POLYETHYLENE GLYCOL 3350 17 G PO PACK
17.0000 g | PACK | Freq: Every day | ORAL | Status: DC
  Administered 2022-12-30 – 2023-01-01 (×2): 17 g via ORAL
  Filled 2022-12-28 (×5): qty 1

## 2022-12-28 MED ORDER — FOLIC ACID 1 MG PO TABS
1.0000 mg | ORAL_TABLET | Freq: Every day | ORAL | Status: DC
  Administered 2022-12-29 – 2023-01-01 (×4): 1 mg via ORAL
  Filled 2022-12-28 (×4): qty 1

## 2022-12-28 MED ORDER — ATORVASTATIN CALCIUM 10 MG PO TABS
10.0000 mg | ORAL_TABLET | Freq: Every day | ORAL | Status: DC
  Administered 2022-12-29 – 2023-01-01 (×4): 10 mg via ORAL
  Filled 2022-12-28 (×4): qty 1

## 2022-12-28 MED ORDER — THIAMINE MONONITRATE 100 MG PO TABS
100.0000 mg | ORAL_TABLET | Freq: Every day | ORAL | Status: DC
  Administered 2022-12-28 – 2023-01-01 (×5): 100 mg via ORAL
  Filled 2022-12-28 (×5): qty 1

## 2022-12-28 MED ORDER — PROSOURCE TF20 ENFIT COMPATIBL EN LIQD
60.0000 mL | Freq: Two times a day (BID) | ENTERAL | Status: DC
  Administered 2022-12-28 (×2): 60 mL
  Filled 2022-12-28 (×4): qty 60

## 2022-12-28 MED ORDER — AMLODIPINE BESYLATE 10 MG PO TABS
10.0000 mg | ORAL_TABLET | Freq: Every day | ORAL | Status: DC
  Administered 2022-12-28 – 2023-01-01 (×5): 10 mg via ORAL
  Filled 2022-12-28 (×5): qty 1

## 2022-12-28 MED ORDER — DOCUSATE SODIUM 100 MG PO CAPS
100.0000 mg | ORAL_CAPSULE | Freq: Two times a day (BID) | ORAL | Status: DC
  Administered 2022-12-28 – 2023-01-01 (×7): 100 mg via ORAL
  Filled 2022-12-28 (×10): qty 1

## 2022-12-28 MED ORDER — NICOTINE 21 MG/24HR TD PT24
21.0000 mg | MEDICATED_PATCH | Freq: Every day | TRANSDERMAL | Status: DC
  Administered 2022-12-28 – 2023-01-01 (×5): 21 mg via TRANSDERMAL
  Filled 2022-12-28 (×5): qty 1

## 2022-12-28 MED ORDER — ZINC SULFATE 220 (50 ZN) MG PO CAPS: 220.0000 mg | ORAL_CAPSULE | Freq: Every day | ORAL | Status: DC

## 2022-12-28 MED ORDER — VITAL 1.5 CAL PO LIQD
1000.0000 mL | ORAL | Status: DC
  Administered 2022-12-28: 1000 mL
  Filled 2022-12-28 (×2): qty 1000

## 2022-12-28 MED ORDER — VITAMIN C 500 MG PO TABS
500.0000 mg | ORAL_TABLET | Freq: Every day | ORAL | Status: DC
  Administered 2022-12-29 – 2023-01-01 (×4): 500 mg via ORAL
  Filled 2022-12-28 (×4): qty 1

## 2022-12-28 MED ORDER — POTASSIUM CHLORIDE 20 MEQ PO PACK
20.0000 meq | PACK | Freq: Once | ORAL | Status: AC
  Administered 2022-12-28: 20 meq via ORAL
  Filled 2022-12-28: qty 1

## 2022-12-28 NOTE — Procedures (Signed)
 Cortrak  Person Inserting Tube:  Alroy Dust, Makayla L, RD Tube Type:  Cortrak - 43 inches Tube Size:  10 Tube Location:  Left nare Secured by: Bridle Technique Used to Measure Tube Placement:  Marking at nare/corner of mouth Cortrak Secured At:  85 cm   Cortrak Tube Team Note:  Consult received to place a Cortrak feeding tube.   X-ray is required, abdominal x-ray has been ordered by the Cortrak team. Please confirm tube placement before using the Cortrak tube.   If the tube becomes dislodged please keep the tube and contact the Cortrak team at [email or web address] for replacement.  If after hours and replacement cannot be delayed, place a NG tube and confirm placement with an abdominal x-ray.    Hermina Barters RD, LDN Clinical Dietitian See Shea Evans for contact information.

## 2022-12-28 NOTE — Progress Notes (Signed)
eLink Physician-Brief Progress Note Patient Name: Eden Rho DOB: 08/27/1960 MRN: [id number]   Date of Service  12/28/2022  HPI/Events of Note  Notified of patient request for nicotine patch. He smokes 1 pack per day x 44 years.   eICU Interventions  Nicotine patch ordered.     Intervention Category Minor Interventions: Other:  Larinda Buttery 12/28/2022, 12:44 AM

## 2022-12-28 NOTE — Evaluation (Signed)
Physical Therapy Evaluation Patient Details Name: Luis [name] MRN: [id number] DOB: 1961-04-17 Today's Date: 12/28/2022  History of Present Illness  Luis [name] is a 62 y.o. male admitted 12/23/22 with SOB, cough and wheezing, found to have PNA with SIRS and possible CHF.  Intubated 12/25/22 and extubated 12/27/22. Patient with medical history significant of third-degree AV block status post PPM, history of colectomy with chronic abdominal wall wound, HTN, HLD, IIDM, alcohol abuse.  Clinical Impression  Patient presents with decreased mobility due to deficits listed in PT problem list.  Patient previously living alone independent, currently not oriented, needing min to mod A for up to recliner and on 5L O2 at rest.  Patient assisted back to bed per nursing due to sedative medications.  Feel he will benefit from skilled PT in the acute setting and may need follow up post-acute rehab, < 3hours/day prior to d/c home.         Assistance Recommended at Discharge Frequent or constant Supervision/Assistance  If plan is discharge home, recommend the following:  Can travel by private vehicle  A lot of help with walking and/or transfers;A lot of help with bathing/dressing/bathroom;Assistance with cooking/housework;Assist for transportation;Help with stairs or ramp for entrance   No    Equipment Recommendations Other (comment) (TBA)  Recommendations for Other Services       Functional Status Assessment Patient has had a recent decline in their functional status and demonstrates the ability to make significant improvements in function in a reasonable and predictable amount of time.     Precautions / Restrictions Precautions Precautions: Fall Precaution Comments: cortrak      Mobility  Bed Mobility Overal bed mobility: Needs Assistance Bed Mobility: Supine to Sit, Sit to Supine     Supine to sit: Min assist Sit to supine: Min guard   General bed mobility comments: slow to initiate but  moving well, assist for lines and balance and cues for technique    Transfers Overall transfer level: Needs assistance Equipment used: Rolling walker (2 wheels) Transfers: Sit to/from Stand, Bed to chair/wheelchair/BSC Sit to Stand: Min assist   Step pivot transfers: Mod assist       General transfer comment: cues for hand placement and assist to rise, mod A for stepping to recliner with RW due to LOB; back to bed per nursing as did not feel safe with keeping pt in recliner due to meds.    Ambulation/Gait                  Stairs            Wheelchair Mobility     Tilt Bed    Modified Rankin (Stroke Patients Only)       Balance Overall balance assessment: Needs assistance   Sitting balance-Leahy Scale: Fair     Standing balance support: Bilateral upper extremity supported, Reliant on assistive device for balance Standing balance-Leahy Scale: Poor Standing balance comment: UE support and A with LOB during stepping to recliner                             Pertinent Vitals/Pain Pain Assessment Pain Assessment: No/denies pain    Home Living Family/patient expects to be discharged to:: Private residence Living Arrangements: Alone                 Additional Comments: patient poor historian, reports lives alone, works for [employer] and thinks he is in [place], states sister  in [place] and son in [place], but thinks son in [place] right now    Prior Function Prior Level of Function : Independent/Modified Independent             Mobility Comments: states independent PTA       Hand Dominance        Extremity/Trunk Assessment   Upper Extremity Assessment Upper Extremity Assessment: Overall WFL for tasks assessed    Lower Extremity Assessment Lower Extremity Assessment: Overall WFL for tasks assessed    Cervical / Trunk Assessment Cervical / Trunk Assessment: Kyphotic  Communication   Communication: No difficulties   Cognition Arousal/Alertness: Awake/alert Behavior During Therapy: WFL for tasks assessed/performed Overall Cognitive Status: Impaired/Different from baseline Area of Impairment: Orientation, Attention, Following commands, Safety/judgement                 Orientation Level: Disoriented to, Place, Time, Situation Current Attention Level: Sustained   Following Commands: Follows one step commands consistently Safety/Judgement: Decreased awareness of deficits              General Comments General comments (skin integrity, edema, etc.): VSS on 5L O2    Exercises     Assessment/Plan    PT Assessment Patient needs continued PT services  PT Problem List Decreased strength;Decreased balance;Decreased cognition;Decreased mobility;Decreased activity tolerance;Cardiopulmonary status limiting activity       PT Treatment Interventions DME instruction;Functional mobility training;Balance training;Gait training;Therapeutic activities;Therapeutic exercise;Cognitive remediation    PT Goals (Current goals can be found in the Care Plan section)  Acute Rehab PT Goals Patient Stated Goal: unable to state PT Goal Formulation: Patient unable to participate in goal setting Time For Goal Achievement: 01/11/23 Potential to Achieve Goals: Good    Frequency Min 1X/week     Co-evaluation               AM-PAC PT "6 Clicks" Mobility  Outcome Measure Help needed turning from your back to your side while in a flat bed without using bedrails?: A Little Help needed moving from lying on your back to sitting on the side of a flat bed without using bedrails?: A Little Help needed moving to and from a bed to a chair (including a wheelchair)?: A Lot Help needed standing up from a chair using your arms (e.g., wheelchair or bedside chair)?: A Little Help needed to walk in hospital room?: Total Help needed climbing 3-5 steps with a railing? : Total 6 Click Score: 13    End of Session  Equipment Utilized During Treatment: Gait belt;Oxygen Activity Tolerance: Patient limited by fatigue Patient left: in bed;with call bell/phone within reach;with bed alarm set;with nursing/sitter in room   PT Visit Diagnosis: Other abnormalities of gait and mobility (R26.89);Muscle weakness (generalized) (M62.81);Other symptoms and signs involving the nervous system (R29.898)    Time: 5621-3086 PT Time Calculation (min) (ACUTE ONLY): 36 min   Charges:   PT Evaluation $PT Eval Moderate Complexity: 1 Mod PT Treatments $Therapeutic Activity: 8-22 mins PT General Charges $$ ACUTE PT VISIT: 1 Visit         Sheran Lawless, PT Acute Rehabilitation Services Office:[phone] 12/28/2022   Luis [name] 12/28/2022, 5:58 PM

## 2022-12-28 NOTE — Progress Notes (Signed)
Wasted 1mg  of Versed with Enriqueta Shutter, RN in stericycle.

## 2022-12-28 NOTE — Progress Notes (Signed)
Brief Nutrition Support Note  Pt underwent cortrak placement today as he is unable to meeting his high nutrition needs orally secondary to periods of confusion and lethargy. Previously tolerating TF briefly while intubated. Will re-initiate tube feeds and monitor for improvement in mental status. Discussed with RN.   Cortrak tube terminates in the distal stomach.   Recommend the following: Vital 1.5 at 60 ml/hr Begin TF at rate of 20 ml/hr; titrate by 10 mL q 6 hours until goal rate of 60 ml/hr Pro-source TF20 60 mL BID TF at goal provides 137 g of protein, 2320 kcals and 1094 mL of free water  Of note, pt also had several micronutrient labs drawn, some of which have resulted. Will discontinue Zinc as there is no deficiency present.  Micronutrient Profile: CRP 0.8 Vitamin C pending Vitamin A pending Zinc 63 (WNL)   Greig Castilla, RD, LDN Clinical Dietitian RD pager # available in AMION  After hours/weekend pager # available in [facility]

## 2022-12-28 NOTE — Progress Notes (Addendum)
NAME:  Luis [name], MRN:  [id number], DOB:  [birth year], LOS: 5 ADMISSION DATE:  12/23/2022, CONSULTATION DATE:  7/14  REFERRING MD:  Gwenlyn Perking , CHIEF COMPLAINT: pneumonia    History of Present Illness:  62 year old male with history as below including HFpEF, AV block s/p PPM, ETOH (pint vodka/day), and poorly healing abdominal wound. Admitted with SOB felt to be due to PNA. Started ABX. Increasing O2 requirements, then BiPAP. Then intubated and transferred to the ICU 7/14.  Pertinent  Medical History  AV block w/ PPM, HTN, type II DM, ETOH abuse (pint of vodka/d) HFpEF, HL, DVT, diverticulitis, ileostomy, non-healing abd wound followed in wound clinic abd hernia  Significant Hospital Events: Including procedures, antibiotic start and stop dates in addition to other pertinent events   7/12 admitted Placed on supplemental oxygen as well as intermittent BiPAP started initially on doxycycline, then transition to Unasyn for possible aspiration 7/13 still hypoxic still on and off BiPAP.  critical care asked to see  Echocardiogram EF 50 to 55% with low normal function no regional wall abnormality grade 2 diastolic heart dysfunction left atrium mildly dilated normal right ventricular function normal right ventricular size 7/14 Tx to ICU and intubated for high FiO2 requirement and intermittent  need for benzodiazepines for withdrawal  7/17 extubated yesterday, remains on precedex though calmer this morning  Interim History / Subjective:  Extubated and stable from respiratory standpoint Precedex decreased to 0.5mg /kg/hr from 1.2 overnight Still disoriented, but calm, on phenobarbital taper protocol  Objective   Blood pressure (!) 134/92, pulse 91, temperature 100 F (37.8 C), resp. rate 18, height 6\' 1"  (1.854 m), weight 98.6 kg, SpO2 94%.    Vent Mode: PSV;CPAP FiO2 (%):  [40 %] 40 % PEEP:  [5 cmH20] 5 cmH20   Intake/Output Summary (Last 24 hours) at 12/28/2022 0735 Last data filed at 12/28/2022  0700 Gross per 24 hour  Intake 1557.51 ml  Output 3950 ml  Net -2392.49 ml   Filed Weights   12/26/22 0200 12/27/22 0500 12/28/22 0424  Weight: 107.2 kg 102.6 kg 98.6 kg    General:  chronically ill-appearing M, resting in bed in no distress HEENT: MM pink/moist, sclera anicteric Neuro: awake, following commands and answering questions CV: s1s2 paced , no m/r/g PULM:  mildly diminished bilateral bases, on [place] without rhonchi or wheezing  GI: soft, bandaged chronic abdominal wound without drainage or TTP Extremities: warm/dry, 1+ edema  Skin: no rashes or lesions     Echo 7/13 > LVEF 50-55%, Grade 2 DD.  LE doppler 7/15 > Negative  Labs: K 3.8 Mag 1.9 WBC 15k  3.7L UOP  CXR 7/17 without significant changes   Resolved Hospital Problem list   AKI Hyperkalemia  Assessment & Plan:   Acute hypoxic and hypercarbic respiratory failure in the setting of pneumonia, superimposed on chronic obstructive pulmonary disease.  Presuming aspiration versus CAP.  Improving, now on 6L [place] -continue supplemental O2 to maintain sats >92% -continue Lasix 40mg  every day and monitor response, net negative since admission so monitor for over-diuresis -continue unasyn and azithromycin  - Scheduled nebs  Acute diastolic heart failure with element of pulmonary edema HTN - IV diuresis - continue to hold Norvasc    Hyperglycemia with suboptimal control - continue Semglee to 18 units BID - resistant SSI q4hrs  Acute delirium with acute alcohol  Improving -wean precedex off as able - Continuing thiamine and folate - Phenobarb taper. 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  Typically followed at wound ostomy clinic.  Has history of prior ileostomy Plan -Continue current wound care as outlined by wound ostomy nurse which includes daily cleansing and  soaking of affected areas to be followed by gentle pat dry then Vashe moistened gauze applied with ABD and a security bandage over that   Best Practice (right click and "Reselect all SmartList Selections" daily)   Diet/type: NPO DVT prophylaxis: LMWH GI prophylaxis: N/A Lines: N/A Foley:  N/A D/C Code Status:  full code Last date of multidisciplinary goals of care discussion [pending]   Critical care time: 35    CRITICAL CARE Performed by: Darcella Gasman Markiesha Delia   Total critical care time: 35 minutes  Critical care time was exclusive of separately billable procedures and treating other patients.  Critical care was necessary to treat or prevent imminent or life-threatening deterioration.  Critical care was time spent personally by me on the following activities: development of treatment plan with patient and/or surrogate as well as nursing, discussions with consultants, evaluation of patient's response to treatment, examination of patient, obtaining history from patient or surrogate, ordering and performing treatments and interventions, ordering and review of laboratory studies, ordering and review of radiographic studies, pulse oximetry and re-evaluation of patient's condition.    Darcella Gasman Arisbel Maione, PA-C [facility] See Amion for pager If no response to pager , please call [phone] until 7pm After 7:00 pm call Elink  [phone]

## 2022-12-28 NOTE — TOC Progression Note (Signed)
Transition of Care [facility]) - Progression Note    Patient Details  Name: Luis [name] MRN: [id number] Date of Birth: 08/16/1960  [facility] CM/SW Contact  Elliot Cousin, RN Phone Number: [phone] 12/28/2022, 12:49 PM  Clinical Narrative:  Pt lives at home alone, will need PT/OT evaluation. Pt may need SNF rehab vs SNF LTC. Will continue to follow up for dc needs.      Expected Discharge Plan: Skilled Nursing Facility Barriers to Discharge: Continued Medical Work up  Expected Discharge Plan and Services   Discharge Planning Services: CM Consult   Living arrangements for the past 2 months: Single Family Home                                       Social Determinants of Health (SDOH) Interventions SDOH Screenings   Food Insecurity: Patient Unable To Answer (12/23/2022)  Housing: High Risk (12/23/2022)  Transportation Needs: Patient Unable To Answer (12/23/2022)  Utilities: Patient Unable To Answer (12/23/2022)  Depression (PHQ2-9): Low Risk  (11/04/2022)  Tobacco Use: High Risk (12/23/2022)    Readmission Risk Interventions     No data to display

## 2022-12-29 DIAGNOSIS — J189 Pneumonia, unspecified organism: Secondary | ICD-10-CM | POA: Diagnosis not present

## 2022-12-29 LAB — BASIC METABOLIC PANEL
Anion gap: 9 (ref 5–15)
BUN: 53 mg/dL — ABNORMAL HIGH (ref 8–23)
CO2: 28 mmol/L (ref 22–32)
Calcium: 8.6 mg/dL — ABNORMAL LOW (ref 8.9–10.3)
Chloride: 98 mmol/L (ref 98–111)
Creatinine, Ser: 1.38 mg/dL — ABNORMAL HIGH (ref 0.61–1.24)
GFR, Estimated: 57 mL/min — ABNORMAL LOW (ref >60)
Glucose, Bld: 285 mg/dL — ABNORMAL HIGH (ref 70–99)
Potassium: 3.4 mmol/L — ABNORMAL LOW (ref 3.5–5.1)
Sodium: 135 mmol/L (ref 135–145)

## 2022-12-29 LAB — GLUCOSE, CAPILLARY
Glucose-Capillary: 109 mg/dL — ABNORMAL HIGH (ref 70–99)
Glucose-Capillary: 120 mg/dL — ABNORMAL HIGH (ref 70–99)
Glucose-Capillary: 215 mg/dL — ABNORMAL HIGH (ref 70–99)
Glucose-Capillary: 235 mg/dL — ABNORMAL HIGH (ref 70–99)
Glucose-Capillary: 293 mg/dL — ABNORMAL HIGH (ref 70–99)
Glucose-Capillary: 388 mg/dL — ABNORMAL HIGH (ref 70–99)
Glucose-Capillary: 94 mg/dL (ref 70–99)

## 2022-12-29 LAB — PHOSPHORUS: Phosphorus: 4.2 mg/dL (ref 2.5–4.6)

## 2022-12-29 LAB — CBC
HCT: 41 % (ref 39.0–52.0)
Hemoglobin: 13.3 g/dL (ref 13.0–17.0)
MCH: 34.4 pg — ABNORMAL HIGH (ref 26.0–34.0)
MCHC: 32.4 g/dL (ref 30.0–36.0)
MCV: 105.9 fL — ABNORMAL HIGH (ref 80.0–100.0)
Platelets: 225 10*3/uL (ref 150–400)
RBC: 3.87 MIL/uL — ABNORMAL LOW (ref 4.22–5.81)
RDW: 13.6 % (ref 11.5–15.5)
WBC: 14.7 10*3/uL — ABNORMAL HIGH (ref 4.0–10.5)
nRBC: 0 % (ref 0.0–0.2)

## 2022-12-29 LAB — PROCALCITONIN: Procalcitonin: 0.27 ng/mL

## 2022-12-29 LAB — MAGNESIUM: Magnesium: 2.1 mg/dL (ref 1.7–2.4)

## 2022-12-29 MED ORDER — INSULIN ASPART 100 UNIT/ML IJ SOLN
0.0000 [IU] | Freq: Three times a day (TID) | INTRAMUSCULAR | Status: DC
  Administered 2022-12-30: 4 [IU] via SUBCUTANEOUS
  Administered 2022-12-30 – 2022-12-31 (×2): 3 [IU] via SUBCUTANEOUS
  Administered 2022-12-31 (×2): 7 [IU] via SUBCUTANEOUS
  Administered 2023-01-01: 15 [IU] via SUBCUTANEOUS
  Administered 2023-01-01: 7 [IU] via SUBCUTANEOUS

## 2022-12-29 MED ORDER — CLONIDINE HCL 0.1 MG PO TABS
0.1000 mg | ORAL_TABLET | Freq: Two times a day (BID) | ORAL | Status: AC
  Administered 2022-12-30 – 2022-12-31 (×2): 0.1 mg via ORAL
  Filled 2022-12-29 (×2): qty 1

## 2022-12-29 MED ORDER — FAMOTIDINE 20 MG PO TABS
20.0000 mg | ORAL_TABLET | Freq: Two times a day (BID) | ORAL | Status: DC
  Administered 2022-12-29 – 2023-01-01 (×6): 20 mg via ORAL
  Filled 2022-12-29 (×6): qty 1

## 2022-12-29 MED ORDER — SODIUM CHLORIDE 0.9% FLUSH
10.0000 mL | Freq: Two times a day (BID) | INTRAVENOUS | Status: DC
  Administered 2022-12-29 – 2023-01-01 (×6): 10 mL

## 2022-12-29 MED ORDER — CLONIDINE HCL 0.1 MG PO TABS
0.1000 mg | ORAL_TABLET | Freq: Every day | ORAL | Status: AC
  Administered 2023-01-01: 0.1 mg via ORAL
  Filled 2022-12-29: qty 1

## 2022-12-29 MED ORDER — SODIUM CHLORIDE 0.9% FLUSH: 10.0000 mL | INTRAVENOUS | Status: DC | PRN

## 2022-12-29 MED ORDER — CLONIDINE HCL 0.1 MG PO TABS
0.1000 mg | ORAL_TABLET | Freq: Four times a day (QID) | ORAL | Status: AC
  Administered 2022-12-29 – 2022-12-30 (×4): 0.1 mg via ORAL
  Filled 2022-12-29 (×4): qty 1

## 2022-12-29 NOTE — Plan of Care (Signed)
  Problem: Education: Goal: Knowledge of disease or condition will improve Outcome: Not Progressing   Problem: Education: Goal: Knowledge of the prescribed therapeutic regimen will improve Outcome: Not Progressing   Problem: Respiratory: Goal: Ability to maintain a clear airway will improve Outcome: Progressing   Problem: Respiratory: Goal: Levels of oxygenation will improve Outcome: Progressing   Problem: Respiratory: Goal: Ability to maintain adequate ventilation will improve Outcome: Progressing

## 2022-12-29 NOTE — Evaluation (Signed)
Clinical/Bedside Swallow Evaluation Patient Details  Name: Luis [name] MRN: [id number] Date of Birth: [birth year]  Today's Date: 12/29/2022 Time: SLP Start Time (ACUTE ONLY): 1350 SLP Stop Time (ACUTE ONLY): 1405 SLP Time Calculation (min) (ACUTE ONLY): 15 min  Past Medical History:  Past Medical History:  Diagnosis Date   Diabetes mellitus without complication (HCC)    Diverticulitis    DVT (deep venous thrombosis) (HCC)    x3   ETOH abuse    H/O colectomy    Hyperlipidemia    Hypertension    Smoker    Past Surgical History:  Past Surgical History:  Procedure Laterality Date   COLOSTOMY     PACEMAKER LEADLESS INSERTION N/A 12/31/2020   Procedure: PACEMAKER LEADLESS INSERTION;  Surgeon: [name], MD;  Location: [facility] INVASIVE CV LAB;  Service: Cardiovascular;  Laterality: N/A;   TONSILLECTOMY     HPI:  Patient is a 62 y.o. male with PMH: hird-degree AV block status post PPM, history of colectomy with chronic abdominal wall wound, HTN, HLD, IIDM, alcohol abuse (pint vodka per day), tobacco use (1ppd), presented to the hospital on 12/23/2022 with sudden onset of cough wheezing shortness of breath. He was started on antibiotics, had increasing O2 requires, required BiPAP and eventual intubation and transfer to ICU on 7/14. He was extubated on 7/16 and advanced to heart healthy/regular texture solids diet. Later that day he had worsening mental status, put back on precedex and added ativan and was not safe for PO's. Cortrak placed on 7/17. Cortrak was removed per MD order on 7/18 and SLP swallow evaluation ordered.    Assessment / Plan / Recommendation  Clinical Impression  Patient is not currently presenting with clinical s/s of dysphagia as per this bedside swallow evaluation. His voice was strong, clear and he did not exhibit any focal weakness during oral motor examination. Swallow assessed via consecutive straw sips of water with no overt s/s aspiration observed and swallow  initiation being timely. No reports of difficulty with PO intake during chart review. It seems that he failed [facility] with nursing on 7/16 because had a decline in mental status and required sedating medications. SLP not recommending further skilled services but please reorder if any future concern of PO toleration. SLP Visit Diagnosis: Dysphagia, unspecified (R13.10)    Aspiration Risk  No limitations    Diet Recommendation Regular;Thin liquid    Liquid Administration via: Cup;Straw Medication Administration: Whole meds with liquid Supervision: Patient able to self feed Compensations: Small sips/bites;Slow rate Postural Changes: Seated upright at 90 degrees    Other  Recommendations Oral Care Recommendations: Oral care BID    Recommendations for follow up therapy are one component of a multi-disciplinary discharge planning process, led by the attending physician.  Recommendations may be updated based on patient status, additional functional criteria and insurance authorization.  Follow up Recommendations No SLP follow up      Assistance Recommended at Discharge    Functional Status Assessment Patient has not had a recent decline in their functional status  Frequency and Duration     N/A       Prognosis        Swallow Study   General Date of Onset: 12/29/22 HPI: Patient is a 62 y.o. male with PMH: hird-degree AV block status post PPM, history of colectomy with chronic abdominal wall wound, HTN, HLD, IIDM, alcohol abuse (pint vodka per day), tobacco use (1ppd), presented to the hospital on 12/23/2022 with sudden onset of cough wheezing shortness  of breath. He was started on antibiotics, had increasing O2 requires, required BiPAP and eventual intubation and transfer to ICU on 7/14. He was extubated on 7/16 and advanced to heart healthy/regular texture solids diet. Later that day he had worsening mental status, put back on precedex and added ativan and was not safe for PO's. Cortrak  placed on 7/17. Cortrak was removed per MD order on 7/18 and SLP swallow evaluation ordered. Type of Study: Bedside Swallow Evaluation Previous Swallow Assessment: none found Diet Prior to this Study: Regular;Thin liquids (Level 0) Temperature Spikes Noted: No Respiratory Status: Nasal cannula History of Recent Intubation: Yes Total duration of intubation (days): 3 days Date extubated: 12/27/22 Behavior/Cognition: Alert;Cooperative;Pleasant mood;Confused Oral Cavity Assessment: Within Functional Limits Oral Care Completed by SLP: No Oral Cavity - Dentition: Adequate natural dentition Vision: Functional for self-feeding Self-Feeding Abilities: Able to feed self Patient Positioning: Upright in bed Baseline Vocal Quality: Normal Volitional Cough: Strong Volitional Swallow: Able to elicit    Oral/Motor/Sensory Function Overall Oral Motor/Sensory Function: Within functional limits   Ice Chips     Thin Liquid Thin Liquid: Within functional limits Presentation: Straw;Self Fed    Nectar Thick     Honey Thick     Puree Puree: Not tested   Solid     Solid: Not tested      Angela Nevin, MA, CCC-SLP Speech Therapy

## 2022-12-29 NOTE — Evaluation (Signed)
Occupational Therapy Evaluation Patient Details Name: Luis [name] MRN: [id number] DOB: 1961-04-09 Today's Date: 12/29/2022   History of Present Illness Azad Calame is a 62 y.o. male admitted 12/23/22 with SOB, cough and wheezing, found to have PNA with SIRS and possible CHF.  Intubated 12/25/22 and extubated 12/27/22. Patient with medical history significant of third-degree AV block status post PPM, history of colectomy with chronic abdominal wall wound, HTN, HLD, IIDM, alcohol abuse.   Clinical Impression   Pt presents with decline in function and safety with ADLs and ADL mobility with impaired strength, balance, endurance and cognition. 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 Pt currently requires min A with UB ADLs, max - total A with LB ADLs, mod A sit - stand to RW with cues for hand placement and safety. Pt back to bed due to ostomy bag begining to leak with stool, RN entered room to assist. Pt would benefit from acute OT services to address impairments to maximize level of function and safety     Recommendations for follow up therapy are one component of a multi-disciplinary discharge planning process, led by the attending physician.  Recommendations may be updated based on patient status, additional functional criteria and insurance authorization.   Assistance Recommended at Discharge Frequent or constant Supervision/Assistance  Patient can return home with the following A lot of help with bathing/dressing/bathroom;A little help with walking and/or transfers;Assistance with cooking/housework;Assist for transportation;Direct supervision/assist for medications management;Help with stairs or ramp for entrance    Functional Status Assessment  Patient has had a recent decline in their functional status and demonstrates  the ability to make significant improvements in function in a reasonable and predictable amount of time.  Equipment Recommendations   (TBD)    Recommendations for Other Services       Precautions / Restrictions Precautions Precautions: Fall Precaution Comments: cortrak, ileostomy leaking Restrictions Weight Bearing Restrictions: No      Mobility Bed Mobility Overal bed mobility: Needs Assistance Bed Mobility: Supine to Sit, Sit to Supine     Supine to sit: Min assist Sit to supine: Min assist   General bed mobility comments: slow to initiate, increased time required    Transfers Overall transfer level: Needs assistance Equipment used: Rolling walker (2 wheels) Transfers: Sit to/from Stand, Bed to chair/wheelchair/BSC Sit to Stand: Min assist           General transfer comment: cues for hand placement and assist to rise, mod A  with RW due to LOB; back to bed due to ostomy bag begining to leak with stool, RN entered room to assist      Balance Overall balance assessment: Needs assistance Sitting-balance support: No upper extremity supported, Feet supported Sitting balance-Leahy Scale: Fair     Standing balance support: Bilateral upper extremity supported, Reliant on assistive device for balance Standing balance-Leahy Scale: Poor                             ADL either performed or assessed with clinical judgement   ADL Overall ADL's : Needs assistance/impaired     Grooming: Wash/dry hands;Wash/dry face;Min guard;Sitting   Upper Body Bathing: Minimal assistance;Sitting   Lower Body Bathing: Maximal assistance   Upper Body Dressing : Minimal assistance;Sitting  Lower Body Dressing: Total assistance   Toilet Transfer: Moderate assistance;Minimal assistance;Cueing for safety;Cueing for sequencing;Rolling walker (2 wheels) Toilet Transfer Details (indicate cue type and reason): simulated SPT back to bed form RW level Toileting- Clothing  Manipulation and Hygiene: Maximal assistance       Functional mobility during ADLs: Moderate assistance;Minimal assistance;Rolling walker (2 wheels);Cueing for safety;Cueing for sequencing       Vision Baseline Vision/History: 1 Wears glasses Ability to See in Adequate Light: 0 Adequate Patient Visual Report: No change from baseline       Perception     Praxis      Pertinent Vitals/Pain Pain Assessment Pain Assessment: No/denies pain     Hand Dominance Right   Extremity/Trunk Assessment Upper Extremity Assessment Upper Extremity Assessment: Generalized weakness   Lower Extremity Assessment Lower Extremity Assessment: Defer to PT evaluation   Cervical / Trunk Assessment Cervical / Trunk Assessment: Kyphotic   Communication Communication Communication: No difficulties   Cognition Arousal/Alertness: Awake/alert Behavior During Therapy: WFL for tasks assessed/performed Overall Cognitive Status: Impaired/Different from baseline Area of Impairment: Orientation, Attention, Following commands, Safety/judgement, Memory                 Orientation Level: Disoriented to, Place, Time, Situation     Following Commands: Follows one step commands consistently Safety/Judgement: Decreased awareness of deficits           General Comments       Exercises     Shoulder Instructions      Home Living Family/patient expects to be discharged to:: Private residence Living Arrangements: Alone   Type of Home: House       Home Layout: Two level;Able to live on main level with bedroom/bathroom     Bathroom Shower/Tub: Tub/shower unit;Walk-in shower   Bathroom Toilet: Standard         Additional Comments: patient poor historian, reports lives alone, works for [employer] and 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 and that it's September 2024, states his son in [place] right now      Prior Functioning/Environment Prior Level of Function : Independent/Modified  Independent             Mobility Comments: states independent PTA ADLs Comments: states that he is Ind with ADLs, IADLs, drives        OT Problem List: Decreased strength;Impaired balance (sitting and/or standing);Decreased cognition;Decreased safety awareness;Obesity;Decreased activity tolerance;Decreased knowledge of use of DME or AE      OT Treatment/Interventions: Self-care/ADL training;DME and/or AE instruction;Therapeutic activities;Balance training;Therapeutic exercise;Patient/family education    OT Goals(Current goals can be found in the care plan section) Acute Rehab OT Goals Patient Stated Goal: "get to work" OT Goal Formulation: With patient Time For Goal Achievement: 01/12/23 Potential to Achieve Goals: Good ADL Goals Pt Will Perform Grooming: with supervision;with set-up;sitting Pt Will Perform Upper Body Bathing: with min guard assist;with supervision;sitting Pt Will Perform Lower Body Bathing: with mod assist;sitting/lateral leans Pt Will Perform Upper Body Dressing: with min guard assist;with supervision;sitting Pt Will Transfer to Toilet: with min assist;ambulating Pt Will Perform Toileting - Clothing Manipulation and hygiene: with mod assist;sit to/from stand  OT Frequency: Min 2X/week    Co-evaluation              AM-PAC OT "6 Clicks" Daily Activity     Outcome Measure Help from another person eating meals?: Total Help from another person taking care of personal grooming?: A Little Help from another person toileting, which includes using toliet, bedpan,  or urinal?: Total Help from another person bathing (including washing, rinsing, drying)?: A Lot Help from another person to put on and taking off regular upper body clothing?: A Little Help from another person to put on and taking off regular lower body clothing?: Total 6 Click Score: 11   End of Session Equipment Utilized During Treatment: Gait belt;Rolling walker (2 wheels)  Activity Tolerance:  Patient tolerated treatment well;Other (comment) (ostomy bag began leaking) Patient left: in bed;with call bell/phone within reach;with nursing/sitter in room  OT Visit Diagnosis: Unsteadiness on feet (R26.81);Other abnormalities of gait and mobility (R26.89);Other symptoms and signs involving cognitive function                Time: 8295-6213 OT Time Calculation (min): 26 min Charges:  OT General Charges $OT Visit: 1 Visit OT Evaluation $OT Eval Moderate Complexity: 1 Mod OT Treatments $Self Care/Home Management : 8-22 mins    Galen Manila 12/29/2022, 1:21 PM

## 2022-12-29 NOTE — Progress Notes (Signed)
A midline has been ordered for this patient. The [facility] RN has reviewed the patient's medical record including any arm restrictions, current creatinine clearance, length IV therapy is needed, and infusions needed/ordered to determine if a midline is the appropriate line for this individual patient. If there are contraindications, the physician and primary RN has been contacted by [facility] RN for further discussion. Midline Education: a midline is a long peripheral IV placed in the upper arm with the tip located at or near the axilla and distal to the shoulder should not be used as a CLABSI preventative measure   it has one lumen only it can remain in place for up to 29 days  Safe for Vancomycin infusion LESS THAN 6 days Is safe for power injection if good blood return can be obtained and line flushes easily it CANNOT be used for continuous infusion of vesicants. Including TPN and chemotherapy Should NOT be placed for sole intent of obtaining labs as there is no guarantee blood can be successfully drawn from line  contraindicated in patients with thrombosis, hypercoagulability, decreased venous flow to the extremities, ESRD (without a nephrologist's approval), small vessels, allergy to polyurethane, or known/suspected presence of a device-related infection, bacteremia, or septicemia   HS Lee RN 

## 2022-12-29 NOTE — Progress Notes (Signed)
PROGRESS NOTE  Luis [name] ZOX:[id number] DOB: 08-12-1960 DOA: 12/23/2022 PCP: [name], NP  HPI/Recap of past 56 hours: 62 year old male with history as below including HFpEF, AV block s/p PPM, ETOH (pint vodka/day), and poorly healing abdominal wound. Admitted with SOB felt to be due to PNA. Started ABX. Increasing O2 requirements, then BiPAP. Then intubated and transferred to the ICU 7/14.  Was not able to pass a swallow evaluation in the ICU therefore a core track tube feeding was placed.  Pertinent  Medical History  AV block w/ PPM, HTN, type II DM, ETOH abuse (pint of vodka/d) HFpEF, HL, DVT, diverticulitis, ileostomy, non-healing abd wound followed in wound clinic abd hernia.  Transferred back to [facility], hospitalist service, on 12/29/2022:  12/29/2022: The patient was seen and examined at his bedside.  He has no new complaints.  He would like to have the core track removed.  Speech therapy consulted for swallow evaluation prior to removing the core track feeding tube.  Assessment/Plan: Principal Problem:   Community acquired pneumonia Active Problems:   Acute on chronic heart failure with preserved ejection fraction (HFpEF) (HCC)   AKI (acute kidney injury) (HCC)   Essential hypertension   Alcoholism (HCC)   Type 2 diabetes mellitus with hyperlipidemia (HCC)   Class 1 obesity   Skin rash   Alcohol withdrawal syndrome, with delirium (HCC)   Acute pulmonary edema (HCC)   Encephalopathy acute  Acute hypoxic and hypercarbic respiratory failure in the setting of pneumonia, superimposed on chronic obstructive pulmonary disease.  Presuming aspiration versus CAP.  Improving, O2 requirement 2 L on 12/29/2022 from 6 L. -continue supplemental O2 to maintain sats >92% Currently on Unasyn for CAP, completed azithromycin for COPD exacerbation Currently on IV Lasix Continue scheduled nebs. Mobilize as tolerated  Acute diastolic heart failure with element of pulmonary edema HTN - IV  diuresis - continue to hold Norvasc Continue strict I's and O's and daily weight Net INO -5.8 L.   Hyperglycemia with suboptimal control Currently has core track feeding tube due to dysphagia while in the ICU - continue Semglee to 18 units BID - resistant SSI q4hrs  Dysphagia, unknown cause Speech therapist consulted Continue core track feeding tube until passes a swallow evaluation   Improving, acute delirium with acute alcohol withdrawal Improving Precedex has been weaned off. 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  Typically followed at wound ostomy clinic.  Has history of prior ileostomy Continue local wound care per the guidance of wound care specialist. -Continue current wound care as outlined by wound ostomy nurse which includes daily cleansing and soaking of affected areas to be followed by gentle pat dry then Vashe moistened gauze applied with ABD and a security bandage over that    Time: 50 minutes.     Best Practice (right click and "Reselect all SmartList Selections" daily)    Diet/type: NPO DVT prophylaxis: LMWH, subcu Lovenox daily GI prophylaxis: N/A Lines: N/A Foley:  N/A D/C Code Status:  full code    Consultants: PCCM   Antimicrobials: Unasyn   Status is: Inpatient The patient requires at least 2 midnights for further evaluation and treatment of present condition.    Objective: Vitals:   12/29/22 0008 12/29/22 0454 12/29/22 0817 12/29/22 1100  BP: (!) 130/97 115/81 128/87 (!) 140/89  Pulse: 91 91 94 92  Resp: 19 19 20 14   Temp: 98.4  F (36.9 C) 98 F (36.7 C) 97.8 F (36.6 C) 98.9 F (37.2 C)  TempSrc: Oral Oral Oral Oral  SpO2: 93%  95% 93%  Weight:  95.9 kg    Height:        Intake/Output Summary (Last 24 hours) at 12/29/2022 1244 Last data filed at 12/29/2022 1149 Gross per 24 hour  Intake 1950 ml   Output 1955 ml  Net -5 ml   Filed Weights   12/28/22 0424 12/28/22 1358 12/29/22 0454  Weight: 98.6 kg 106.6 kg 95.9 kg    Exam:  General: 62 y.o. year-old male well developed well nourished in no acute distress.  Alert and oriented x3. Cardiovascular: Regular rate and rhythm with no rubs or gallops.  No thyromegaly or JVD noted.   Respiratory: Clear to auscultation with no wheezes or rales. Good inspiratory effort. Abdomen: Soft nontender nondistended with normal bowel sounds x4 quadrants. Musculoskeletal: No lower extremity edema. 2/4 pulses in all 4 extremities. Skin: No ulcerative lesions noted or rashes, Psychiatry: Mood is appropriate for condition and setting   Data Reviewed: CBC: Recent Labs  Lab 12/23/22 1325 12/23/22 1414 12/25/22 0450 12/25/22 1747 12/25/22 2309 12/26/22 0635 12/27/22 0626 12/28/22 0635 12/29/22 0030  WBC 14.2*   < > 17.8*  --   --  10.9* 11.6* 15.9* 14.7*  NEUTROABS 10.7*  --  16.0*  --   --   --   --   --   --   HGB 12.7*   < > 12.1*   < > 10.9* 10.9* 13.5 15.0 13.3  HCT 38.2*   < > 37.7*   < > 32.0* 33.9* 40.3 45.0 41.0  MCV 104.7*   < > 105.9*  --   --  108.3* 105.8* 106.4* 105.9*  PLT 239   < > 250  --   --  196 206 217 225   < > = values in this interval not displayed.   Basic Metabolic Panel: Recent Labs  Lab 12/25/22 0450 12/25/22 1747 12/25/22 2309 12/26/22 4696 12/26/22 2952 12/26/22 1546 12/27/22 0626 12/27/22 1647 12/28/22 0635 12/29/22 0030  NA 129*   < > 133* 135  --   --  138  --  140 135  K 5.7*   < > 4.7 4.5  --   --  3.8  --  3.8 3.4*  CL 97*  --   --  102  --   --  101  --  103 98  CO2 24  --   --  23  --   --  27  --  26 28  GLUCOSE 261*  --   --  163*  --   --  199*  --  122* 285*  BUN 50*  --   --  54*  --   --  39*  --  31* 53*  CREATININE 1.86*  --   --  1.41*  --   --  1.03  --  1.10 1.38*  CALCIUM 9.4  --   --  9.2  --   --  9.2  --  9.0 8.6*  MG  --   --   --  2.4   < > 2.1 2.0 1.7 1.9 2.1  PHOS  --    --   --  4.1   < > 4.5 3.6 3.8 3.8 4.2   < > = values in this interval not displayed.   GFR: Estimated Creatinine Clearance: 66.9 mL/min (A) (  by C-G formula based on SCr of 1.38 mg/dL (H)). Liver Function Tests: Recent Labs  Lab 12/23/22 1325 12/28/22 0635  AST 53* 87*  ALT 41 93*  ALKPHOS 156* 111  BILITOT 0.5 1.2  PROT 8.1 7.4  ALBUMIN 3.8 3.4*   No results for input(s): "LIPASE", "AMYLASE" in the last 168 hours. Recent Labs  Lab 12/25/22 2015  AMMONIA 34   Coagulation Profile: No results for input(s): "INR", "PROTIME" in the last 168 hours. Cardiac Enzymes: No results for input(s): "CKTOTAL", "CKMB", "CKMBINDEX", "TROPONINI" in the last 168 hours. BNP (last 3 results) No results for input(s): "PROBNP" in the last 8760 hours. HbA1C: No results for input(s): "HGBA1C" in the last 72 hours. CBG: Recent Labs  Lab 12/29/22 0011 12/29/22 0200 12/29/22 0458 12/29/22 0731 12/29/22 1056  GLUCAP 388* 235* 94 109* 293*   Lipid Profile: No results for input(s): "CHOL", "HDL", "LDLCALC", "TRIG", "CHOLHDL", "LDLDIRECT" in the last 72 hours. Thyroid Function Tests: No results for input(s): "TSH", "T4TOTAL", "FREET4", "T3FREE", "THYROIDAB" in the last 72 hours. Anemia Panel: No results for input(s): "VITAMINB12", "FOLATE", "FERRITIN", "TIBC", "IRON", "RETICCTPCT" in the last 72 hours. Urine analysis:    Component Value Date/Time   COLORURINE YELLOW 08/02/2019 2009   APPEARANCEUR CLEAR 08/02/2019 2009   LABSPEC 1.024 08/02/2019 2009   PHURINE 5.0 08/02/2019 2009   GLUCOSEU NEGATIVE 08/02/2019 2009   HGBUR NEGATIVE 08/02/2019 2009   BILIRUBINUR NEGATIVE 08/02/2019 2009   KETONESUR NEGATIVE 08/02/2019 2009   PROTEINUR NEGATIVE 08/02/2019 2009   NITRITE NEGATIVE 08/02/2019 2009   LEUKOCYTESUR NEGATIVE 08/02/2019 2009   Sepsis Labs: @LABRCNTIP (procalcitonin:4,lacticidven:4)  ) Recent Results (from the past 240 hour(s))  SARS Coronavirus 2 by RT PCR (hospital order,  performed in [facility] hospital lab) *cepheid single result test* Anterior Nasal Swab     Status: None   Collection Time: 12/23/22  1:22 PM   Specimen: Anterior Nasal Swab  Result Value Ref Range Status   SARS Coronavirus 2 by RT PCR NEGATIVE NEGATIVE Final    Comment: Performed at [facility], [address], [place], [place] [address]  MRSA Next Gen by PCR, Nasal     Status: None   Collection Time: 12/25/22  2:14 PM   Specimen: Nasal Mucosa; Nasal Swab  Result Value Ref Range Status   MRSA by PCR Next Gen NOT DETECTED NOT DETECTED Final    Comment: (NOTE) The GeneXpert MRSA Assay (FDA approved for NASAL specimens only), is one component of a comprehensive MRSA colonization surveillance program. It is not intended to diagnose MRSA infection nor to guide or monitor treatment for MRSA infections. Test performance is not FDA approved in patients less than 74 years old. Performed at [facility], [address] [place], [place] [address]   Culture, Respiratory w Gram Stain     Status: None   Collection Time: 12/25/22  4:28 PM   Specimen: Tracheal Aspirate; Respiratory  Result Value Ref Range Status   Specimen Description TRACHEAL ASPIRATE  Final   Special Requests NONE  Final   Gram Stain NO WBC SEEN NO ORGANISMS SEEN   Final   Culture   Final    NO GROWTH 2 DAYS Performed at [facility], [address] [place], [place] [address]    Report Status 12/28/2022 FINAL  Final      Studies: No results found.  Scheduled Meds:  amLODipine  10 mg Oral Daily   arformoterol  15 mcg Nebulization BID  ascorbic acid  500 mg Oral Daily   atorvastatin  10 mg Oral Daily   budesonide (PULMICORT) nebulizer solution  0.5 mg Nebulization Q12H   Chlorhexidine Gluconate Cloth  6 each Topical Daily   cloNIDine  0.2 mg Oral Q6H   docusate sodium  100 mg Oral BID   enoxaparin (LOVENOX) injection  50 mg Subcutaneous Q24H   famotidine  20 mg Oral BID   feeding supplement   237 mL Oral TID BM   feeding supplement (PROSource TF20)  60 mL Per Tube BID   folic acid  1 mg Oral Daily   furosemide  40 mg Intravenous Daily   insulin aspart  0-20 Units Subcutaneous Q4H   insulin glargine-yfgn  18 Units Subcutaneous BID   multivitamin with minerals  1 tablet Oral Daily   nicotine  21 mg Transdermal Daily   polyethylene glycol  17 g Oral Daily   thiamine  100 mg Oral Daily    Continuous Infusions:  sodium chloride Stopped (12/27/22 1211)   ampicillin-sulbactam (UNASYN) IV 3 g (12/29/22 0517)   feeding supplement (VITAL 1.5 CAL) 30 mL/hr at 12/29/22 0517     LOS: 6 days     Darlin Drop, MD [facility] Pager [phone]  If 7PM-7AM, please contact night-coverage [email or web address] Password [facility] 12/29/2022, 12:44 PM

## 2022-12-29 NOTE — Progress Notes (Signed)
Cortrak has been removed per MD order. Patient is stable

## 2022-12-29 NOTE — TOC Progression Note (Signed)
Transition of Care [facility]) - Progression Note    Patient Details  Name: Luis [name] MRN: [id number] Date of Birth: 07-Nov-1960  [facility] CM/SW Contact  Delilah Shan, LCSWA Phone Number: 12/29/2022, 4:04 PM  Clinical Narrative:     CSW received consult for possible SNF placement at time of discharge. Due to patients current orientation CSW called patients son and LVM. CSW awaiting call back to discuss PT recommendations for patient.CSW to continue to follow and assist with discharge planning needs.   Expected Discharge Plan: Skilled Nursing Facility Barriers to Discharge: Continued Medical Work up  Expected Discharge Plan and Services   Discharge Planning Services: CM Consult   Living arrangements for the past 2 months: Single Family Home                                       Social Determinants of Health (SDOH) Interventions SDOH Screenings   Food Insecurity: Patient Unable To Answer (12/23/2022)  Housing: High Risk (12/23/2022)  Transportation Needs: Patient Unable To Answer (12/23/2022)  Utilities: Patient Unable To Answer (12/23/2022)  Depression (PHQ2-9): Low Risk  (11/04/2022)  Tobacco Use: High Risk (12/23/2022)    Readmission Risk Interventions     No data to display

## 2022-12-30 DIAGNOSIS — J189 Pneumonia, unspecified organism: Secondary | ICD-10-CM | POA: Diagnosis not present

## 2022-12-30 LAB — BASIC METABOLIC PANEL
Anion gap: 10 (ref 5–15)
Anion gap: 11 (ref 5–15)
BUN: 39 mg/dL — ABNORMAL HIGH (ref 8–23)
BUN: 45 mg/dL — ABNORMAL HIGH (ref 8–23)
CO2: 27 mmol/L (ref 22–32)
CO2: 27 mmol/L (ref 22–32)
Calcium: 8.4 mg/dL — ABNORMAL LOW (ref 8.9–10.3)
Calcium: 8.4 mg/dL — ABNORMAL LOW (ref 8.9–10.3)
Chloride: 97 mmol/L — ABNORMAL LOW (ref 98–111)
Chloride: 97 mmol/L — ABNORMAL LOW (ref 98–111)
Creatinine, Ser: 1.18 mg/dL (ref 0.61–1.24)
Creatinine, Ser: 1.24 mg/dL (ref 0.61–1.24)
GFR, Estimated: >60 mL/min (ref >60)
GFR, Estimated: >60 mL/min (ref >60)
Glucose, Bld: 133 mg/dL — ABNORMAL HIGH (ref 70–99)
Glucose, Bld: 119 mg/dL — ABNORMAL HIGH (ref 70–99)
Potassium: 3.4 mmol/L — ABNORMAL LOW (ref 3.5–5.1)
Potassium: 3.4 mmol/L — ABNORMAL LOW (ref 3.5–5.1)
Sodium: 134 mmol/L — ABNORMAL LOW (ref 135–145)
Sodium: 135 mmol/L (ref 135–145)

## 2022-12-30 LAB — CBC
HCT: 40 % (ref 39.0–52.0)
HCT: 37.6 % — ABNORMAL LOW (ref 39.0–52.0)
Hemoglobin: 13 g/dL (ref 13.0–17.0)
Hemoglobin: 12.4 g/dL — ABNORMAL LOW (ref 13.0–17.0)
MCH: 34.2 pg — ABNORMAL HIGH (ref 26.0–34.0)
MCH: 34.2 pg — ABNORMAL HIGH (ref 26.0–34.0)
MCHC: 32.5 g/dL (ref 30.0–36.0)
MCHC: 33 g/dL (ref 30.0–36.0)
MCV: 105.3 fL — ABNORMAL HIGH (ref 80.0–100.0)
MCV: 103.6 fL — ABNORMAL HIGH (ref 80.0–100.0)
Platelets: 199 10*3/uL (ref 150–400)
Platelets: 192 10*3/uL (ref 150–400)
RBC: 3.8 MIL/uL — ABNORMAL LOW (ref 4.22–5.81)
RBC: 3.63 MIL/uL — ABNORMAL LOW (ref 4.22–5.81)
RDW: 13.3 % (ref 11.5–15.5)
RDW: 13.4 % (ref 11.5–15.5)
WBC: 14.2 10*3/uL — ABNORMAL HIGH (ref 4.0–10.5)
WBC: 14.3 10*3/uL — ABNORMAL HIGH (ref 4.0–10.5)
nRBC: 0 % (ref 0.0–0.2)
nRBC: 0 % (ref 0.0–0.2)

## 2022-12-30 LAB — GLUCOSE, CAPILLARY
Glucose-Capillary: 117 mg/dL — ABNORMAL HIGH (ref 70–99)
Glucose-Capillary: 131 mg/dL — ABNORMAL HIGH (ref 70–99)
Glucose-Capillary: 139 mg/dL — ABNORMAL HIGH (ref 70–99)
Glucose-Capillary: 141 mg/dL — ABNORMAL HIGH (ref 70–99)
Glucose-Capillary: 141 mg/dL — ABNORMAL HIGH (ref 70–99)
Glucose-Capillary: 183 mg/dL — ABNORMAL HIGH (ref 70–99)

## 2022-12-30 LAB — MAGNESIUM
Magnesium: 2.1 mg/dL (ref 1.7–2.4)
Magnesium: 2.2 mg/dL (ref 1.7–2.4)

## 2022-12-30 LAB — VITAMIN C: Vitamin C: 0.2 mg/dL — ABNORMAL LOW (ref 0.4–2.0)

## 2022-12-30 LAB — PROCALCITONIN: Procalcitonin: 0.19 ng/mL

## 2022-12-30 LAB — PHOSPHORUS
Phosphorus: 2.7 mg/dL (ref 2.5–4.6)
Phosphorus: 2.8 mg/dL (ref 2.5–4.6)

## 2022-12-30 NOTE — Progress Notes (Signed)
Physical Therapy Treatment Patient Details Name: Luis [name] MRN: [id number] DOB: [birth year] Today's Date: 12/30/2022   History of Present Illness Luis [name] is a 62 y.o. male admitted 12/23/22 with SOB, cough and wheezing, found to have PNA with SIRS and possible CHF.  Intubated 12/25/22 and extubated 12/27/22. Patient with medical history significant of third-degree AV block status post PPM, history of colectomy with chronic abdominal wall wound, HTN, HLD, IIDM, alcohol abuse.    PT Comments  Pt sleeping upon arrival, wakes easily and agreeable to OOB mobility but endorses being "wobbly". Pt with good progression today, ambulating in hallway with RW and physical assist for steadying/correcting x1 LOB. Pt maintained SPO2 90% and greater on 2LO2 with no reports of dyspnea. Pt remains cognitively impaired, as he is not oriented to location or situation, has poor safety awareness. PT to continue to follow acutely, plan remains appropriate.      Assistance Recommended at Discharge Frequent or constant Supervision/Assistance  If plan is discharge home, recommend the following:  Can travel by private vehicle    Assistance with cooking/housework;Assist for transportation;Help with stairs or ramp for entrance;A little help with bathing/dressing/bathroom;A little help with walking and/or transfers   No  Equipment Recommendations  None recommended by PT    Recommendations for Other Services       Precautions / Restrictions Precautions Precautions: Fall Precaution Comments: cortrak, ileostomy leaking Restrictions Weight Bearing Restrictions: No     Mobility  Bed Mobility Overal bed mobility: Needs Assistance Bed Mobility: Supine to Sit, Sit to Supine     Supine to sit: Supervision, HOB elevated Sit to supine: Supervision, HOB elevated        Transfers Overall transfer level: Needs assistance Equipment used: Rolling walker (2 wheels) Transfers: Sit to/from Stand Sit to Stand:  Min assist           General transfer comment: assist to rise and steady, cues for hand placement when rising    Ambulation/Gait Ambulation/Gait assistance: Min assist Gait Distance (Feet): 150 Feet Assistive device: Rolling walker (2 wheels) Gait Pattern/deviations: Decreased stride length, Step-through pattern, Trunk flexed Gait velocity: decr     General Gait Details: assist to steady and guide RW, cues for placement in RW   Stairs             Wheelchair Mobility     Tilt Bed    Modified Rankin (Stroke Patients Only)       Balance Overall balance assessment: Needs assistance Sitting-balance support: No upper extremity supported, Feet supported Sitting balance-Leahy Scale: Fair     Standing balance support: Bilateral upper extremity supported, Reliant on assistive device for balance Standing balance-Leahy Scale: Poor                              Cognition Arousal/Alertness: Awake/alert Behavior During Therapy: WFL for tasks assessed/performed Overall Cognitive Status: Impaired/Different from baseline Area of Impairment: Orientation, Attention, Following commands, Safety/judgement, Memory                 Orientation Level: Disoriented to, Place, Time, Situation Current Attention Level: Sustained Memory: Decreased recall of precautions, Decreased short-term memory Following Commands: Follows one step commands consistently Safety/Judgement: Decreased awareness of deficits     General Comments: pt disoriented to situation, location, and is seemingly paranoid stating "I feel wobbly, I think they are giving me medication to make me wobbly, none of this makes any sense (talking about his diagnoses  of HF and PNA)        Exercises      General Comments General comments (skin integrity, edema, etc.): 2LO2, SpO2 90% and greater      Pertinent Vitals/Pain Pain Assessment Pain Assessment: Faces Faces Pain Scale: No hurt Pain  Intervention(s): Limited activity within patient's tolerance, Monitored during session, Repositioned    Home Living                          Prior Function            PT Goals (current goals can now be found in the care plan section) Acute Rehab PT Goals Patient Stated Goal: unable to state PT Goal Formulation: Patient unable to participate in goal setting Time For Goal Achievement: 01/11/23 Potential to Achieve Goals: Good Progress towards PT goals: Progressing toward goals    Frequency    Min 1X/week      PT Plan Current plan remains appropriate    Co-evaluation              AM-PAC PT "6 Clicks" Mobility   Outcome Measure  Help needed turning from your back to your side while in a flat bed without using bedrails?: A Little Help needed moving from lying on your back to sitting on the side of a flat bed without using bedrails?: A Little Help needed moving to and from a bed to a chair (including a wheelchair)?: A Little Help needed standing up from a chair using your arms (e.g., wheelchair or bedside chair)?: A Little Help needed to walk in hospital room?: A Little Help needed climbing 3-5 steps with a railing? : A Lot 6 Click Score: 17    End of Session Equipment Utilized During Treatment: Oxygen Activity Tolerance: Patient limited by fatigue Patient left: in bed;with call bell/phone within reach;with bed alarm set;with nursing/sitter in room Nurse Communication: Mobility status PT Visit Diagnosis: Other abnormalities of gait and mobility (R26.89);Muscle weakness (generalized) (M62.81);Other symptoms and signs involving the nervous system (R29.898)     Time: 1227-1259 PT Time Calculation (min) (ACUTE ONLY): 32 min  Charges:    $Gait Training: 8-22 mins $Therapeutic Activity: 8-22 mins PT General Charges $$ ACUTE PT VISIT: 1 Visit                     [name], PT DPT Acute Rehabilitation Services Secure Chat Preferred  Office  [phone]    Amiee Wiley Sheliah Plane 12/30/2022, 4:36 PM

## 2022-12-30 NOTE — Progress Notes (Signed)
PROGRESS NOTE  Luis [name] YQM:[id number] DOB: March 21, 1961 DOA: 12/23/2022 PCP: [name], NP  HPI/Recap of past 52 hours: 62 year old male with history as below including HFpEF, AV block s/p PPM, ETOH (pint vodka/day), and poorly healing abdominal wound. Admitted with SOB felt to be due to PNA. Started ABX. Increasing O2 requirements, then BiPAP. Then intubated and transferred to the ICU 7/14.  Was not able to pass a swallow evaluation in the ICU therefore a core track tube feeding was placed.  Pertinent  Medical History  AV block w/ PPM, HTN, type II DM, ETOH abuse (pint of vodka/d) HFpEF, HL, DVT, diverticulitis, ileostomy, non-healing abd wound followed in wound clinic abd hernia.  Transferred back to [facility], hospitalist service, on 12/29/2022: coretrack removed.  12/30/2022:  Concern about his abdominal wall wound for which wound care specialist was consulted to assist with the management.  Assessment/Plan: Principal Problem:   Community acquired pneumonia Active Problems:   Acute on chronic heart failure with preserved ejection fraction (HFpEF) (HCC)   AKI (acute kidney injury) (HCC)   Essential hypertension   Alcoholism (HCC)   Type 2 diabetes mellitus with hyperlipidemia (HCC)   Class 1 obesity   Skin rash   Alcohol withdrawal syndrome, with delirium (HCC)   Acute pulmonary edema (HCC)   Encephalopathy acute  Acute hypoxic and hypercarbic respiratory failure in the setting of pneumonia, superimposed on chronic obstructive pulmonary disease.  Presuming aspiration versus CAP.  Improving, O2 requirement 2 L on 12/29/2022 from 6 L. -continue supplemental O2 to maintain sats >92% Currently on Unasyn for CAP, completed azithromycin for COPD exacerbation Currently on IV Lasix Continue scheduled nebs. Mobilize as tolerated  Acute diastolic heart failure with element of pulmonary edema HTN - IV diuresis - continue to hold Norvasc Continue strict I's and O's and daily  weight Net INO -5.8 L.   Hyperglycemia with suboptimal control Currently has core track feeding tube due to dysphagia while in the ICU - continue Semglee to 18 units BID - resistant SSI q4hrs  Resolved Dysphagia, unknown cause Coretrack dc'd on 7/18 Tolerating po   Improving, acute delirium with acute alcohol withdrawal Improving Precedex has been weaned off. 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  Typically followed at wound ostomy clinic.  Has history of prior ileostomy Wound care specialist consulted. Continue local wound care per the guidance of wound care specialist. -Continue current wound care as outlined by wound ostomy nurse which includes daily cleansing and soaking of affected areas to be followed by gentle pat dry then Vashe moistened gauze applied with ABD and a security bandage over that.    Time: 50 minutes.     Best Practice (right click and "Reselect all SmartList Selections" daily)    Diet/type: NPO DVT prophylaxis: LMWH, subcu Lovenox daily GI prophylaxis: N/A Lines: N/A Foley:  N/A D/C Code Status:  full code    Consultants: PCCM   Antimicrobials: Unasyn   Status is: Inpatient The patient requires at least 2 midnights for further evaluation and treatment of present condition.    Objective: Vitals:   12/29/22 2042 12/30/22 0003 12/30/22 0439 12/30/22 0740  BP: 129/82 110/80 101/63   Pulse: 90 93 77   Resp: 20 20 20    Temp: 98.5 F (36.9 C) (!) 100.8 F (38.2 C) 98.1 F (36.7 C)   TempSrc: Oral Oral Oral   SpO2:  92%  100% 99%  Weight:      Height:        Intake/Output Summary (Last 24 hours) at 12/30/2022 1407 Last data filed at 12/30/2022 0012 Gross per 24 hour  Intake 420 ml  Output 1300 ml  Net -880 ml   Filed Weights   12/28/22 0424 12/28/22 1358 12/29/22 0454  Weight: 98.6 kg 106.6 kg 95.9 kg     Exam:  General: 62 y.o. year-old male well developed well nourished in no acute distress.  Alert and oriented x3. Cardiovascular: Regular rate and rhythm with no rubs or gallops.  No thyromegaly or JVD noted.   Respiratory: Clear to auscultation with no wheezes or rales. Good inspiratory effort. Abdomen: distended, R side colostomy bag in place.  Anterior abdominal wall wound packed  with hypoactive bowel sounds.   Musculoskeletal: No lower extremity edema. 2/4 pulses in all 4 extremities. Skin: No ulcerative lesions noted or rashes, Psychiatry: Mood is appropriate for condition and setting   Data Reviewed: CBC: Recent Labs  Lab 12/25/22 0450 12/25/22 1747 12/27/22 0626 12/28/22 0635 12/29/22 0030 12/29/22 2338 12/30/22 0551  WBC 17.8*   < > 11.6* 15.9* 14.7* 14.2* 14.3*  NEUTROABS 16.0*  --   --   --   --   --   --   HGB 12.1*   < > 13.5 15.0 13.3 13.0 12.4*  HCT 37.7*   < > 40.3 45.0 41.0 40.0 37.6*  MCV 105.9*   < > 105.8* 106.4* 105.9* 105.3* 103.6*  PLT 250   < > 206 217 225 199 192   < > = values in this interval not displayed.   Basic Metabolic Panel: Recent Labs  Lab 12/26/22 0635 12/26/22 1546 12/27/22 0626 12/27/22 1647 12/28/22 0635 12/29/22 0030 12/30/22 0551  NA 135  --  138  --  140 135 134*  K 4.5  --  3.8  --  3.8 3.4* 3.4*  CL 102  --  101  --  103 98 97*  CO2 23  --  27  --  26 28 27   GLUCOSE 163*  --  199*  --  122* 285* 133*  BUN 54*  --  39*  --  31* 53* 39*  CREATININE 1.41*  --  1.03  --  1.10 1.38* 1.18  CALCIUM 9.2  --  9.2  --  9.0 8.6* 8.4*  MG 2.4   < > 2.0 1.7 1.9 2.1 2.1  PHOS 4.1   < > 3.6 3.8 3.8 4.2 2.7   < > = values in this interval not displayed.   GFR: Estimated Creatinine Clearance: 78.2 mL/min (by C-G formula based on SCr of 1.18 mg/dL). Liver Function Tests: Recent Labs  Lab 12/28/22 0635  AST 87*  ALT 93*  ALKPHOS 111  BILITOT 1.2  PROT 7.4  ALBUMIN 3.4*   No results for input(s): "LIPASE", "AMYLASE" in  the last 168 hours. Recent Labs  Lab 12/25/22 2015  AMMONIA 34   Coagulation Profile: No results for input(s): "INR", "PROTIME" in the last 168 hours. Cardiac Enzymes: No results for input(s): "CKTOTAL", "CKMB", "CKMBINDEX", "TROPONINI" in the last 168 hours. BNP (last 3 results) No results for input(s): "PROBNP" in the last 8760 hours. HbA1C: No results for input(s): "HGBA1C" in the last 72 hours. CBG: Recent Labs  Lab 12/29/22 2045 12/30/22 0005 12/30/22 0349 12/30/22 0657 12/30/22 1155  GLUCAP 215* 131* 141* 139* 183*   Lipid Profile: No results for input(s): "CHOL", "HDL", "LDLCALC", "  TRIG", "CHOLHDL", "LDLDIRECT" in the last 72 hours. Thyroid Function Tests: No results for input(s): "TSH", "T4TOTAL", "FREET4", "T3FREE", "THYROIDAB" in the last 72 hours. Anemia Panel: No results for input(s): "VITAMINB12", "FOLATE", "FERRITIN", "TIBC", "IRON", "RETICCTPCT" in the last 72 hours. Urine analysis:    Component Value Date/Time   COLORURINE YELLOW 08/02/2019 2009   APPEARANCEUR CLEAR 08/02/2019 2009   LABSPEC 1.024 08/02/2019 2009   PHURINE 5.0 08/02/2019 2009   GLUCOSEU NEGATIVE 08/02/2019 2009   HGBUR NEGATIVE 08/02/2019 2009   BILIRUBINUR NEGATIVE 08/02/2019 2009   KETONESUR NEGATIVE 08/02/2019 2009   PROTEINUR NEGATIVE 08/02/2019 2009   NITRITE NEGATIVE 08/02/2019 2009   LEUKOCYTESUR NEGATIVE 08/02/2019 2009   Sepsis Labs: @LABRCNTIP (procalcitonin:4,lacticidven:4)  ) Recent Results (from the past 240 hour(s))  SARS Coronavirus 2 by RT PCR (hospital order, performed in [facility] hospital lab) *cepheid single result test* Anterior Nasal Swab     Status: None   Collection Time: 12/23/22  1:22 PM   Specimen: Anterior Nasal Swab  Result Value Ref Range Status   SARS Coronavirus 2 by RT PCR NEGATIVE NEGATIVE Final    Comment: Performed at [facility], [address], [place], [place] [address]  MRSA Next Gen by PCR, Nasal     Status: None   Collection  Time: 12/25/22  2:14 PM   Specimen: Nasal Mucosa; Nasal Swab  Result Value Ref Range Status   MRSA by PCR Next Gen NOT DETECTED NOT DETECTED Final    Comment: (NOTE) The GeneXpert MRSA Assay (FDA approved for NASAL specimens only), is one component of a comprehensive MRSA colonization surveillance program. It is not intended to diagnose MRSA infection nor to guide or monitor treatment for MRSA infections. Test performance is not FDA approved in patients less than 42 years old. Performed at [facility], [address] [place], [place] [address]   Culture, Respiratory w Gram Stain     Status: None   Collection Time: 12/25/22  4:28 PM   Specimen: Tracheal Aspirate; Respiratory  Result Value Ref Range Status   Specimen Description TRACHEAL ASPIRATE  Final   Special Requests NONE  Final   Gram Stain NO WBC SEEN NO ORGANISMS SEEN   Final   Culture   Final    NO GROWTH 2 DAYS Performed at [facility], [address], [place], [place] [address]    Report Status 12/28/2022 FINAL  Final      Studies: No results found.  Scheduled Meds:  amLODipine  10 mg Oral Daily   arformoterol  15 mcg Nebulization BID   ascorbic acid  500 mg Oral Daily   atorvastatin  10 mg Oral Daily   budesonide (PULMICORT) nebulizer solution  0.5 mg Nebulization Q12H   Chlorhexidine Gluconate Cloth  6 each Topical Daily   cloNIDine  0.1 mg Oral BID   Followed by   Melene Muller ON 01/01/2023] cloNIDine  0.1 mg Oral Daily   docusate sodium  100 mg Oral BID   enoxaparin (LOVENOX) injection  50 mg Subcutaneous Q24H   famotidine  20 mg Oral BID   feeding supplement  237 mL Oral TID BM   folic acid  1 mg Oral Daily   furosemide  40 mg Intravenous Daily   insulin aspart  0-20 Units Subcutaneous TID WC   insulin glargine-yfgn  18 Units Subcutaneous BID   multivitamin with minerals  1 tablet Oral Daily   nicotine  21 mg Transdermal Daily   polyethylene glycol  17 g Oral Daily   sodium chloride flush   10-40 mL Intracatheter Q12H   thiamine  100 mg Oral Daily    Continuous Infusions:  sodium chloride Stopped (12/27/22 1211)   ampicillin-sulbactam (UNASYN) IV 3 g (12/30/22 1233)     LOS: 7 days     Darlin Drop, MD [facility] Pager [phone]  If 7PM-7AM, please contact night-coverage [email or web address] Password [facility] 12/30/2022, 2:07 PM

## 2022-12-31 ENCOUNTER — Inpatient Hospital Stay (HOSPITAL_COMMUNITY): Payer: Medicaid Other

## 2022-12-31 DIAGNOSIS — M79671 Pain in right foot: Secondary | ICD-10-CM | POA: Diagnosis not present

## 2022-12-31 DIAGNOSIS — J189 Pneumonia, unspecified organism: Secondary | ICD-10-CM | POA: Diagnosis not present

## 2022-12-31 LAB — GLUCOSE, CAPILLARY
Glucose-Capillary: 136 mg/dL — ABNORMAL HIGH (ref 70–99)
Glucose-Capillary: 205 mg/dL — ABNORMAL HIGH (ref 70–99)
Glucose-Capillary: 234 mg/dL — ABNORMAL HIGH (ref 70–99)
Glucose-Capillary: 199 mg/dL — ABNORMAL HIGH (ref 70–99)

## 2022-12-31 LAB — URIC ACID: Uric Acid, Serum: 10.3 mg/dL — ABNORMAL HIGH (ref 3.7–8.6)

## 2022-12-31 MED ORDER — COLCHICINE 0.6 MG PO TABS
0.6000 mg | ORAL_TABLET | Freq: Every day | ORAL | Status: DC
  Administered 2022-12-31 – 2023-01-01 (×2): 0.6 mg via ORAL
  Filled 2022-12-31 (×2): qty 1

## 2022-12-31 MED ORDER — ACETAMINOPHEN 325 MG PO TABS
650.0000 mg | ORAL_TABLET | Freq: Four times a day (QID) | ORAL | Status: DC | PRN
  Administered 2022-12-31 – 2023-01-01 (×3): 650 mg via ORAL
  Filled 2022-12-31 (×3): qty 2

## 2022-12-31 MED ORDER — PREDNISONE 20 MG PO TABS
20.0000 mg | ORAL_TABLET | Freq: Every day | ORAL | Status: DC
  Administered 2022-12-31 – 2023-01-01 (×2): 20 mg via ORAL
  Filled 2022-12-31 (×2): qty 1

## 2022-12-31 NOTE — Plan of Care (Signed)
Pt on room air

## 2022-12-31 NOTE — Progress Notes (Signed)
PROGRESS NOTE  Luis [name] ZOX:[id number] DOB: [birth year] DOA: 12/23/2022 PCP: [name], NP  Summary: Patient is a 62 year old male with history as below including HFpEF, AV block s/p PPM, ETOH (pint vodka/day), and poorly healing abdominal wound. Admitted with SOB felt to be due to PNA. Started ABX. Increasing O2 requirements, then BiPAP. Then intubated and transferred to the ICU 7/14.  Was not able to pass a swallow evaluation in the ICU therefore a core track tube feeding was placed.  12/31/22: Above documentation by Dr. Margo Aye noted.  Patient seen alongside patient's nurse.  Reports pain and swelling right foot, worrisome for acute gout. XR right foot, check uric acid level, and start patient on Colchicine and prednisone.  Consult physical therapy.  Pertinent  Medical History  AV block w/ PPM, HTN, type II DM, ETOH abuse (pint of vodka/d) HFpEF, HL, DVT, diverticulitis, ileostomy, non-healing abd wound followed in wound clinic abd hernia.  Transferred back to [facility], hospitalist service, on 12/29/2022: coretrack removed.  12/30/2022:  Concern about his abdominal wall wound for which wound care specialist was consulted to assist with the management.  Assessment/Plan: Principal Problem:   Community acquired pneumonia Active Problems:   Type 2 diabetes mellitus with hyperlipidemia (HCC)   Essential hypertension   Alcoholism (HCC)   Acute on chronic heart failure with preserved ejection fraction (HFpEF) (HCC)   AKI (acute kidney injury) (HCC)   Class 1 obesity   Skin rash   Alcohol withdrawal syndrome, with delirium (HCC)   Acute pulmonary edema (HCC)   Encephalopathy acute  Acute hypoxic and hypercarbic respiratory failure in the setting of pneumonia, superimposed on chronic obstructive pulmonary disease.  Presuming aspiration versus CAP.  Improving, O2 requirement 2 L on 12/29/2022 from 6 L. -continue supplemental O2 to maintain sats >92% Currently on Unasyn for CAP, completed  azithromycin for COPD exacerbation Currently on IV Lasix Continue scheduled nebs. Mobilize as tolerated  12/30/2022: Stable.  Continue to monitor closely.  Acute diastolic heart failure with element of pulmonary edema HTN - IV diuresis - continue to hold Norvasc Continue strict I's and O's and daily weight Net INO -5.8 L.  12/30/2022: Stable.   Hyperglycemia with suboptimal control Currently has core track feeding tube due to dysphagia while in the ICU - continue Semglee to 18 units BID - resistant SSI q4hrs  12/30/2022: Uncontrolled.  Blood sugar of 205.  Continue to optimize.  Resolved Dysphagia, unknown cause Coretrack dc'd on 7/18 Tolerating po   Improving, acute delirium with acute alcohol withdrawal Improving Precedex has been weaned off. 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  Typically followed at wound ostomy clinic.  Has history of prior ileostomy Wound care specialist consulted. Continue local wound care per the guidance of wound care specialist. -Continue current wound care as outlined by wound ostomy nurse which includes daily cleansing and soaking of affected areas to be followed by gentle pat dry then Vashe moistened gauze applied with ABD and a security bandage over that.  Gout right foot: -Colchicine 0.6 Mg p.o. once daily. -Prednisone 20 Mg p.o. once daily. -Check uric acid. -X-ray of the right foot.  Time: 50 minutes.     Best Practice (right click and "Reselect all SmartList Selections" daily)    Diet/type: NPO DVT prophylaxis: LMWH, subcu Lovenox daily GI prophylaxis: N/A Lines: N/A Foley:  N/A D/C Code Status:  full code  Consultants: PCCM   Antimicrobials: Unasyn (consider discontinuing Unasyn).   Status is: Inpatient The patient requires at least 2 midnights for further evaluation and treatment of  present condition.    Objective: Vitals:   12/31/22 0445 12/31/22 0729 12/31/22 0818 12/31/22 1101  BP: (!) 134/93 121/87  139/73  Pulse: 90 90  93  Resp: 20 18  19   Temp: 98.6 F (37 C) 98.3 F (36.8 C)  99.3 F (37.4 C)  TempSrc: Oral Oral  Oral  SpO2: 98% 95% 96% 95%  Weight: 105.2 kg     Height:        Intake/Output Summary (Last 24 hours) at 12/31/2022 1441 Last data filed at 12/31/2022 1300 Gross per 24 hour  Intake 780 ml  Output 2475 ml  Net -1695 ml   Filed Weights   12/28/22 1358 12/29/22 0454 12/31/22 0445  Weight: 106.6 kg 95.9 kg 105.2 kg    Exam: General: Patient is morbidly obese.  Not in any distress.  Awake and alert.  Right foot is swollen, radiation painful.  Right colostomy bag.  Anterior abdominal wall is covered with significant amount of dressing.  Patient is pale. CVS: S1-S2. Lungs: Decreased air entry. Abdomen: Obese, right colostomy, anterior abdominal wall dressing. Neuro: Awake and alert.  Moves all extremities. Extremities: Right foot is swollen, radiation and tender.   Data Reviewed: CBC: Recent Labs  Lab 12/25/22 0450 12/25/22 1747 12/27/22 0626 12/28/22 1610 12/29/22 0030 12/29/22 2338 12/30/22 0551  WBC 17.8*   < > 11.6* 15.9* 14.7* 14.2* 14.3*  NEUTROABS 16.0*  --   --   --   --   --   --   HGB 12.1*   < > 13.5 15.0 13.3 13.0 12.4*  HCT 37.7*   < > 40.3 45.0 41.0 40.0 37.6*  MCV 105.9*   < > 105.8* 106.4* 105.9* 105.3* 103.6*  PLT 250   < > 206 217 225 199 192   < > = values in this interval not displayed.   Basic Metabolic Panel: Recent Labs  Lab 12/27/22 0626 12/27/22 1647 12/28/22 0635 12/29/22 0030 12/29/22 2338 12/30/22 0551  NA 138  --  140 135 135 134*  K 3.8  --  3.8 3.4* 3.4* 3.4*  CL 101  --  103 98 97* 97*  CO2 27  --  26 28 27 27   GLUCOSE 199*  --  122* 285* 119* 133*  BUN 39*  --  31* 53* 45* 39*  CREATININE 1.03  --  1.10 1.38* 1.24 1.18  CALCIUM 9.2  --  9.0 8.6* 8.4* 8.4*  MG 2.0 1.7 1.9 2.1  2.2 2.1  PHOS 3.6 3.8 3.8 4.2 2.8 2.7   GFR: Estimated Creatinine Clearance: 81.6 mL/min (by C-G formula based on SCr of 1.18 mg/dL). Liver Function Tests: Recent Labs  Lab 12/28/22 0635  AST 87*  ALT 93*  ALKPHOS 111  BILITOT 1.2  PROT 7.4  ALBUMIN 3.4*   No results for input(s): "LIPASE", "AMYLASE" in the last 168 hours. Recent Labs  Lab 12/25/22 2015  AMMONIA 34   Coagulation Profile: No results for input(s): "INR", "PROTIME" in the last 168 hours. Cardiac Enzymes: No results for input(s): "CKTOTAL", "CKMB", "CKMBINDEX", "TROPONINI" in the last 168 hours. BNP (last 3 results) No results for input(s): "PROBNP" in the last 8760 hours. HbA1C: No results for input(s): "HGBA1C" in the last 72 hours. CBG: Recent Labs  Lab 12/30/22 1155 12/30/22 1608 12/30/22 2127 12/31/22 0555 12/31/22 1055  GLUCAP 183* 117* 141* 136* 205*   Lipid Profile: No results for input(s): "CHOL", "HDL", "LDLCALC", "TRIG", "CHOLHDL", "LDLDIRECT" in the last 72 hours. Thyroid Function Tests: No results for input(s): "TSH", "T4TOTAL", "FREET4", "T3FREE", "THYROIDAB" in the last 72 hours. Anemia Panel: No results for input(s): "VITAMINB12", "FOLATE", "FERRITIN", "TIBC", "IRON", "RETICCTPCT" in the last 72 hours. Urine analysis:    Component Value Date/Time   COLORURINE YELLOW 08/02/2019 2009   APPEARANCEUR CLEAR 08/02/2019 2009   LABSPEC 1.024 08/02/2019 2009   PHURINE 5.0 08/02/2019 2009   GLUCOSEU NEGATIVE 08/02/2019 2009   HGBUR NEGATIVE 08/02/2019 2009   BILIRUBINUR NEGATIVE 08/02/2019 2009   KETONESUR NEGATIVE 08/02/2019 2009   PROTEINUR NEGATIVE 08/02/2019 2009   NITRITE NEGATIVE 08/02/2019 2009   LEUKOCYTESUR NEGATIVE 08/02/2019 2009   Sepsis Labs: @LABRCNTIP (procalcitonin:4,lacticidven:4)  ) Recent Results (from the past 240 hour(s))  SARS Coronavirus 2 by RT PCR (hospital order, performed in [facility] hospital lab) *cepheid single result test* Anterior Nasal Swab      Status: None   Collection Time: 12/23/22  1:22 PM   Specimen: Anterior Nasal Swab  Result Value Ref Range Status   SARS Coronavirus 2 by RT PCR NEGATIVE NEGATIVE Final    Comment: Performed at [facility], [address], [place], [place] [address]  MRSA Next Gen by PCR, Nasal     Status: None   Collection Time: 12/25/22  2:14 PM   Specimen: Nasal Mucosa; Nasal Swab  Result Value Ref Range Status   MRSA by PCR Next Gen NOT DETECTED NOT DETECTED Final    Comment: (NOTE) The GeneXpert MRSA Assay (FDA approved for NASAL specimens only), is one component of a comprehensive MRSA colonization surveillance program. It is not intended to diagnose MRSA infection nor to guide or monitor treatment for MRSA infections. Test performance is not FDA approved in patients less than 63 years old. Performed at [facility], [address] [place], [place] [address]   Culture, Respiratory w Gram Stain     Status: None   Collection Time: 12/25/22  4:28 PM   Specimen: Tracheal Aspirate; Respiratory  Result Value Ref Range Status   Specimen Description TRACHEAL ASPIRATE  Final   Special Requests NONE  Final   Gram Stain NO WBC SEEN NO ORGANISMS SEEN   Final   Culture   Final    NO GROWTH 2 DAYS Performed at [facility], [address] [place], [place] [address]    Report Status 12/28/2022 FINAL  Final      Studies: No results found.  Scheduled Meds:  amLODipine  10 mg Oral Daily   arformoterol  15 mcg Nebulization BID   ascorbic acid  500 mg Oral Daily   atorvastatin  10 mg Oral Daily   budesonide (PULMICORT) nebulizer solution  0.5 mg Nebulization Q12H   Chlorhexidine Gluconate Cloth  6 each Topical Daily   [START ON 01/01/2023] cloNIDine  0.1 mg Oral Daily   docusate sodium  100 mg Oral BID   enoxaparin (LOVENOX) injection  50 mg Subcutaneous Q24H   famotidine  20 mg Oral BID   feeding supplement  237 mL Oral TID BM   folic acid  1 mg Oral Daily   furosemide  40  mg Intravenous Daily   insulin aspart  0-20 Units Subcutaneous TID WC   insulin glargine-yfgn  18 Units Subcutaneous BID   multivitamin with minerals  1 tablet Oral Daily   nicotine  21 mg  Transdermal Daily   polyethylene glycol  17 g Oral Daily   sodium chloride flush  10-40 mL Intracatheter Q12H   thiamine  100 mg Oral Daily    Continuous Infusions:  sodium chloride 10 mL/hr at 12/31/22 1244   ampicillin-sulbactam (UNASYN) IV 3 g (12/31/22 1246)     LOS: 8 days     Barnetta Chapel, MD [facility] Pager [phone]  If 7PM-7AM, please contact night-coverage [email or web address] Password [facility] 12/31/2022, 2:41 PM

## 2022-12-31 NOTE — Plan of Care (Signed)
?  Problem: Education: ?Goal: Knowledge of disease or condition will improve ?Outcome: Progressing ?Goal: Knowledge of the prescribed therapeutic regimen will improve ?Outcome: Progressing ?Goal: Individualized Educational Video(s) ?Outcome: Progressing ?  ?Problem: Activity: ?Goal: Ability to tolerate increased activity will improve ?Outcome: Progressing ?Goal: Will verbalize the importance of balancing activity with adequate rest periods ?Outcome: Progressing ?  ?

## 2022-12-31 NOTE — Progress Notes (Signed)
Pharmacy Antibiotic Note  Luis [name] is a 62 y.o. male admitted on 12/23/2022 with pneumonia. Pharmacy has been consulted for Unasyn dosing.  Today is day 8 of antibiotic therapy, no dose adjustments indicated. Spoke with Dr. Dartha Lodge, given patient completed 7 days of antibiotic therapy, plan to discontinue treatment today.   Plan: Discontinue Unasyn 3g q6h  Height: 6\' 1"  (185.4 cm) Weight: 105.2 kg (232 lb) IBW/kg (Calculated) : 79.9  Temp (24hrs), Avg:98.7 F (37.1 C), Min:98.3 F (36.8 C), Max:99.3 F (37.4 C)  Recent Labs  Lab 12/25/22 1308 12/25/22 1528 12/25/22 1746 12/25/22 2015 12/26/22 0635 12/27/22 0626 12/28/22 0635 12/29/22 0030 12/29/22 2338 12/30/22 0551  WBC  --   --   --   --  10.9* 11.6* 15.9* 14.7* 14.2* 14.3*  CREATININE  --   --   --   --  1.41* 1.03 1.10 1.38* 1.24 1.18  LATICACIDVEN 1.5 2.0* 1.2 2.0* 1.5  --   --   --   --   --     Estimated Creatinine Clearance: 81.6 mL/min (by C-G formula based on SCr of 1.18 mg/dL).    Allergies  Allergen Reactions   Lisinopril     Other reaction(s): renal effects   Codeine Other (See Comments) and Hives    unknown Other reaction(s): Other (See Comments) unknown unknown    Antimicrobials this admission:  7/13 unasyn >> 7/20 7/12 doxycyline >>7/13 7/14 Vancomycin x1 7/14 Azithro >> 7/18  Dose adjustments this admission:  Microbiology results: 7/14 MRSA PCR: neg 7/14 trach aspirate: neg 7/12 Mycoplasma pneumo IgM: neg  Thank you for allowing pharmacy to be a part of this patient's care.   Romie Minus, PharmD PGY1 Pharmacy Resident  Please check AMION for all [facility] phone numbers After 10:00 PM, call Main Pharmacy [phone]

## 2023-01-01 DIAGNOSIS — J189 Pneumonia, unspecified organism: Secondary | ICD-10-CM | POA: Diagnosis not present

## 2023-01-01 LAB — GLUCOSE, CAPILLARY
Glucose-Capillary: 242 mg/dL — ABNORMAL HIGH (ref 70–99)
Glucose-Capillary: 310 mg/dL — ABNORMAL HIGH (ref 70–99)

## 2023-01-01 MED ORDER — NICOTINE 21 MG/24HR TD PT24: 21.0000 mg | MEDICATED_PATCH | Freq: Every day | TRANSDERMAL | 0 refills | Status: DC

## 2023-01-01 MED ORDER — PREDNISONE 20 MG PO TABS: 20.0000 mg | ORAL_TABLET | Freq: Every day | ORAL | 0 refills | Status: AC

## 2023-01-01 MED ORDER — FLUTICASONE PROPIONATE HFA 110 MCG/ACT IN AERO: 2.0000 | INHALATION_SPRAY | Freq: Two times a day (BID) | RESPIRATORY_TRACT | 2 refills | Status: DC

## 2023-01-01 MED ORDER — DOCUSATE SODIUM 100 MG PO CAPS: 100.0000 mg | ORAL_CAPSULE | Freq: Two times a day (BID) | ORAL | 0 refills | Status: DC

## 2023-01-01 MED ORDER — TORSEMIDE 20 MG PO TABS: 20.0000 mg | ORAL_TABLET | Freq: Every day | ORAL | 1 refills | Status: DC

## 2023-01-01 MED ORDER — ARFORMOTEROL TARTRATE 15 MCG/2ML IN NEBU: 15.0000 ug | INHALATION_SOLUTION | Freq: Two times a day (BID) | RESPIRATORY_TRACT | 1 refills | Status: DC

## 2023-01-01 MED ORDER — POLYETHYLENE GLYCOL 3350 17 G PO PACK: 17.0000 g | PACK | Freq: Every day | ORAL | 0 refills | Status: DC

## 2023-01-01 MED ORDER — COLCHICINE 0.6 MG PO TABS: 0.6000 mg | ORAL_TABLET | Freq: Every day | ORAL | 0 refills | Status: DC

## 2023-01-01 MED ORDER — INSULIN GLARGINE-YFGN 100 UNIT/ML ~~LOC~~ SOLN: 20.0000 [IU] | Freq: Two times a day (BID) | SUBCUTANEOUS | 11 refills | Status: DC

## 2023-01-01 MED ORDER — ENSURE ENLIVE PO LIQD: 237.0000 mL | Freq: Three times a day (TID) | ORAL | 12 refills | Status: DC

## 2023-01-01 MED ORDER — FAMOTIDINE 20 MG PO TABS: 20.0000 mg | ORAL_TABLET | Freq: Two times a day (BID) | ORAL | 0 refills | Status: DC

## 2023-01-01 MED ORDER — INSULIN GLARGINE-YFGN 100 UNIT/ML ~~LOC~~ SOLN
25.0000 [IU] | Freq: Two times a day (BID) | SUBCUTANEOUS | Status: DC
  Filled 2023-01-01: qty 0.25

## 2023-01-01 MED ORDER — ASCORBIC ACID 500 MG PO TABS: 500.0000 mg | ORAL_TABLET | Freq: Every day | ORAL | 1 refills | Status: AC

## 2023-01-01 NOTE — Progress Notes (Signed)
Physical Therapy Treatment Patient Details Name: Luis [name] MRN: [id number] DOB: April 16, 1961 Today's Date: 01/01/2023   History of Present Illness Luis [name] is a 62 y.o. male admitted 12/23/22 with SOB, cough and wheezing, found to have PNA with SIRS and possible CHF.  Intubated 12/25/22 and extubated 12/27/22. Patient with medical history significant of third-degree AV block status post PPM, history of colectomy with chronic abdominal wall wound, HTN, HLD, IIDM, alcohol abuse.    PT Comments  Pt is demonstrating improved cognitive and physical status. He does not recall the events leading up to this admission but does recall what the MD has told him was the cause of the admission. Pt also oriented to location, self, and date now. He can be tangential with poor attention span, but this may be his baseline. He is also now able to ambulate without LOB or physical assistance with only intermittent UE support on the wall or furniture for pt comfort. He was also able to navigate x3 stairs with x1 handrail without assistance or LOB. As pt appears to be approaching if not at his baseline, updated d/c recs to no PT follow-up. Would have recommended OPPT, but pt reported he does not have time for [facility] or OPPT as he has frequent appointments for his other medical conditions and is trying to arrange surgeries at this time. Educated pt to request PT referral from PCP if he finds time or as these situations are resolved. Also, provided pt with HEP and therabands he can utilize on his own to progress his UE and LE strength. Will continue to follow acutely.    Assistance Recommended at Discharge PRN  If plan is discharge home, recommend the following:  Can travel by private vehicle    Assistance with cooking/housework;Assist for transportation;Direct supervision/assist for medications management;Direct supervision/assist for financial management   Yes  Equipment Recommendations  None recommended by PT     Recommendations for Other Services       Precautions / Restrictions Precautions Precautions: Fall Precaution Comments: cortrak, ileostomy leaking Restrictions Weight Bearing Restrictions: No     Mobility  Bed Mobility               General bed mobility comments: Pt sitting EOB upon arrival and at end of session.    Transfers Overall transfer level: Needs assistance Equipment used: None Transfers: Sit to/from Stand Sit to Stand: Min guard           General transfer comment: Stands from EOB without LOB, min guard for safety    Ambulation/Gait Ambulation/Gait assistance: Min guard Gait Distance (Feet): 180 Feet (x2 bouts of ~180 ft > ~110 ft) Assistive device: None, 1 person hand held assist Gait Pattern/deviations: Decreased stride length, Step-through pattern, Trunk flexed, Antalgic Gait velocity: decreased Gait velocity interpretation: 1.31 - 2.62 ft/sec, indicative of limited community ambulator   General Gait Details: Pt with slow, but mostly steady gait with mild antalgic gait pattern due to R ankle pain. Pt intermittently reaching out for the wall or furniture for support and preferring HHA at times. No LOB, min guard for safety   Stairs Stairs: Yes Stairs assistance: Min guard Stair Management: One rail Left, Alternating pattern, Forwards, Backwards Number of Stairs: 3 General stair comments: Ascends forward and descends backward, no LOB, min guard for safety   Wheelchair Mobility     Tilt Bed    Modified Rankin (Stroke Patients Only)       Balance Overall balance assessment: Mild deficits observed, not formally  tested                                          Cognition Arousal/Alertness: Awake/alert Behavior During Therapy: WFL for tasks assessed/performed Overall Cognitive Status: No family/caregiver present to determine baseline cognitive functioning                                 General Comments: Pt  has no recollection of events that resulted in this admission, thinking he "blacked out from drinking too much" and does endorse frequent over indulgence of alcohol at home. He does recall what the MDs told him was the cause of his admission though and is oriented to date, location, and self. Pt tangential with poor attention, but unsure if this is baseline. Pt bordering on being verbally sexually inappropriate to therapist at times.        Exercises      General Comments General comments (skin integrity, edema, etc.): Provided HEP handout on UE and LE exercises and therabands to improve strength      Pertinent Vitals/Pain Pain Assessment Pain Assessment: Faces Faces Pain Scale: Hurts a little bit Pain Location: R ankle Pain Descriptors / Indicators: Discomfort Pain Intervention(s): Limited activity within patient's tolerance, Monitored during session, Repositioned    Home Living Family/patient expects to be discharged to:: Private residence Living Arrangements: Alone   Type of Home: House         Home Layout: Two level;1/2 bath on main level;Able to live on main level with bedroom/bathroom (avoids going upstairs often) Home Equipment: Rollator (4 wheels)      Prior Function            PT Goals (current goals can now be found in the care plan section) Acute Rehab PT Goals Patient Stated Goal: to go home PT Goal Formulation: With patient Time For Goal Achievement: 01/11/23 Potential to Achieve Goals: Good Progress towards PT goals: Progressing toward goals    Frequency    Min 1X/week      PT Plan Discharge plan needs to be updated    Co-evaluation              AM-PAC PT "6 Clicks" Mobility   Outcome Measure  Help needed turning from your back to your side while in a flat bed without using bedrails?: None Help needed moving from lying on your back to sitting on the side of a flat bed without using bedrails?: None Help needed moving to and from a bed to  a chair (including a wheelchair)?: A Little Help needed standing up from a chair using your arms (e.g., wheelchair or bedside chair)?: A Little Help needed to walk in hospital room?: A Little Help needed climbing 3-5 steps with a railing? : A Little 6 Click Score: 20    End of Session Equipment Utilized During Treatment: Gait belt Activity Tolerance: Patient tolerated treatment well Patient left: in bed;with call bell/phone within reach;with bed alarm set (sitting EOB) Nurse Communication: Mobility status PT Visit Diagnosis: Other abnormalities of gait and mobility (R26.89);Muscle weakness (generalized) (M62.81);Other symptoms and signs involving the nervous system (R29.898);Unsteadiness on feet (R26.81)     Time: 8119-1478 PT Time Calculation (min) (ACUTE ONLY): 27 min  Charges:    $Gait Training: 8-22 mins $Therapeutic Activity: 8-22 mins PT General Charges $$ ACUTE PT  VISIT: 1 Visit                     Raymond Gurney, PT, DPT Acute Rehabilitation Services  Office: [phone]    Jewel Baize 01/01/2023, 1:59 PM

## 2023-01-01 NOTE — Progress Notes (Signed)
Pt has been asking to discharge since this morning. Dr. Dartha Lodge aware, PT and TOC have assessed pt for discharge needs today. Pt is stating he will wear paper scrubs and take a cab home, that he will pay for. He has been unable to get in touch with the person he was planning to drive him home. RN has encouraged pt to remain patient as the care team assesses his needs and prepares him for discharge and for Dr. Dartha Lodge to place the discharge orders. Pt is insisting he wants to leave now and has become increasingly fixated on leaving immediately and is also becoming agitated. He has now removed his heart monitor and is asking for the scrubs so he can leave AMA. Dr. Dartha Lodge notified.

## 2023-01-01 NOTE — Discharge Summary (Signed)
Physician Discharge Summary  Patient ID: Luis [name] MRN: [id number] DOB/AGE: 1960-12-20 62 y.o.  Admit date: 12/23/2022 Discharge date: 01/01/2023  Admission Diagnoses:  Discharge Diagnoses:  Principal Problem:   Community acquired pneumonia Active Problems:   Type 2 diabetes mellitus with hyperlipidemia (HCC)   Essential hypertension   Alcoholism (HCC)   Acute on chronic heart failure with preserved ejection fraction (HFpEF) (HCC)   AKI (acute kidney injury) (HCC)   Class 1 obesity   Skin rash   Alcohol withdrawal syndrome, with delirium (HCC)   Acute pulmonary edema (HCC)   Encephalopathy acute   Discharged Condition: stable  Hospital Course: Patient is a 62 year old male with past medical history significant for heart failure with preserved ejection fraction 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  The patient was admitted with shortness of breath, acute respiratory failure pneumonia and acute on chronic diastolic congestive heart failure.  Patient was admitted for further assessment and management.  Patient was managed with antibiotics, BiPAP due to increasing oxygen requirements.  Patient was eventually intubated and transferred to ICU on 12/25/2018 for further management of respiratory failure.  Hospital stay was also complicated by acute gout that was managed with colchicine and prednisone.  Patient has been stabilized and will be discharged.  Acute hypoxic and hypercarbic respiratory failure in the setting of pneumonia, superimposed on chronic obstructive pulmonary disease.  Presuming aspiration versus CAP: -Improved significantly.  Patient is currently on 2 L of supplemental oxygen.   -continue supplemental O2 to maintain sats >92% -Complete course of antibiotics as. -Undergoing diuresis.     Acute diastolic heart failure with element of pulmonary edema HTN -Initially managed with IV diuresis -Continue to hold  Norvasc -Continue strict I's and O's and daily weight   Hyperglycemia with suboptimal control - continue Semglee to 18 units BID - resistant SSI q4hrs   Resolved Dysphagia, unknown cause Coretrack dc'd on 7/18 Tolerating po   Improving, acute delirium with acute alcohol withdrawal Improving Precedex has been weaned off. 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  Typically followed at wound ostomy clinic.  Has history of prior ileostomy Wound care specialist consulted. Continue local wound care per the guidance of wound care specialist. -Continue current wound care as outlined by wound ostomy nurse which includes daily cleansing and soaking of affected areas to be followed by gentle pat dry then Vashe moistened gauze applied with ABD and a security bandage over that.   Gout right foot: -Colchicine 0.6 Mg p.o. once daily. -Prednisone 20 Mg p.o. once daily. -Check uric acid. -X-ray of the right foot.    Consults: pulmonary/intensive care  Significant Diagnostic Studies:     Discharge Exam: Blood pressure 132/82, pulse 97, temperature 98.4 F (36.9 C), temperature source Oral, resp. rate 17, height 6\' 1"  (1.854 m), weight 105.8 kg, SpO2 96%.   Disposition: Discharge disposition: 01-Home or Self Care       Discharge Instructions     Diet - low sodium heart healthy   Complete by: As directed    Discharge wound care:   Complete by: As directed    Continue current plan   Increase activity slowly   Complete by: As directed       Allergies as of 01/01/2023       Reactions   Lisinopril    Other reaction(s): renal effects   Codeine Other (  See Comments), Hives   unknown Other reaction(s): Other (See Comments) unknown unknown        Medication List     STOP taking these medications    acetaminophen 650 MG CR tablet Commonly known  as: TYLENOL   buPROPion 150 MG 12 hr tablet Commonly known as: Wellbutrin SR   cetirizine 10 MG tablet Commonly known as: ZYRTEC   metoprolol succinate 50 MG 24 hr tablet Commonly known as: TOPROL-XL   traMADol 50 MG tablet Commonly known as: Ultram   triamcinolone cream 0.5 % Commonly known as: KENALOG       TAKE these medications    amLODipine 10 MG tablet Commonly known as: NORVASC Take 10 mg by mouth daily.   arformoterol 15 MCG/2ML Nebu Commonly known as: BROVANA Take 2 mLs (15 mcg total) by nebulization 2 (two) times daily.   ascorbic acid 500 MG tablet Commonly known as: VITAMIN C Take 1 tablet (500 mg total) by mouth daily. Start taking on: January 02, 2023   atorvastatin 10 MG tablet Commonly known as: LIPITOR Take 10 mg by mouth daily.   colchicine 0.6 MG tablet Take 1 tablet (0.6 mg total) by mouth daily for 14 days. Start taking on: January 02, 2023   docusate sodium 100 MG capsule Commonly known as: COLACE Take 1 capsule (100 mg total) by mouth 2 (two) times daily.   famotidine 20 MG tablet Commonly known as: PEPCID Take 1 tablet (20 mg total) by mouth 2 (two) times daily.   feeding supplement Liqd Take 237 mLs by mouth 3 (three) times daily between meals.   fluticasone 110 MCG/ACT inhaler Commonly known as: FLOVENT HFA Inhale 2 puffs into the lungs 2 (two) times daily.   folic acid 1 MG tablet Commonly known as: FOLVITE TAKE 1 TABLET(1 MG) BY MOUTH DAILY   insulin glargine-yfgn 100 UNIT/ML injection Commonly known as: SEMGLEE Inject 0.2 mLs (20 Units total) into the skin 2 (two) times daily.   metFORMIN 1000 MG tablet Commonly known as: GLUCOPHAGE Take 0.5 tablets (500 mg total) by mouth 2 (two) times daily with a meal. What changed:  how much to take when to take this   multivitamin with minerals Tabs tablet Take 1 tablet by mouth every other day.   nicotine 21 mg/24hr patch Commonly known as: NICODERM CQ - dosed in mg/24  hours Place 1 patch (21 mg total) onto the skin daily. Start taking on: January 02, 2023   polyethylene glycol 17 g packet Commonly known as: MIRALAX / GLYCOLAX Take 17 g by mouth daily. Start taking on: January 02, 2023   predniSONE 20 MG tablet Commonly known as: DELTASONE Take 1 tablet (20 mg total) by mouth daily for 5 days. Start taking on: January 02, 2023   thiamine 100 MG tablet Commonly known as: VITAMIN B1 Take 1 tablet (100 mg total) by mouth daily.   torsemide 20 MG tablet Commonly known as: DEMADEX Take 1 tablet (20 mg total) by mouth daily.               Discharge Care Instructions  (From admission, onward)           Start     Ordered   01/01/23 0000  Discharge wound care:       Comments: Continue current plan   01/01/23 1537            Time spent: 35 minutes.  SignedBarnetta Chapel 01/01/2023, 3:37 PM

## 2023-01-01 NOTE — Progress Notes (Signed)
O2 sats >92% RA when pt awake but when pt starting to go to sleep, sats 85-88%. Placed on 1Lnc to keep sats >92% then later needed to increase to 2Lnc to maintain sats while sleeping.   When pt awake again in am, sats 93% RA. Denies feeling any shortness of breath.

## 2023-01-01 NOTE — Plan of Care (Signed)
  Problem: Education: Goal: Knowledge of disease or condition will improve Outcome: Progressing Goal: Knowledge of the prescribed therapeutic regimen will improve Outcome: Progressing Goal: Individualized Educational Video(s) Outcome: Progressing   Problem: Activity: Goal: Ability to tolerate increased activity will improve Outcome: Progressing Goal: Will verbalize the importance of balancing activity with adequate rest periods Outcome: Progressing   Problem: Fluid Volume: Goal: Ability to maintain a balanced intake and output will improve Outcome: Progressing   Problem: Clinical Measurements: Goal: Ability to maintain clinical measurements within normal limits will improve Outcome: Progressing Goal: Will remain free from infection Outcome: Progressing Goal: Diagnostic test results will improve Outcome: Progressing Goal: Respiratory complications will improve Outcome: Progressing Goal: Cardiovascular complication will be avoided Outcome: Progressing

## 2023-01-01 NOTE — Plan of Care (Signed)
  Problem: Education: Goal: Knowledge of disease or condition will improve Outcome: Progressing Goal: Knowledge of the prescribed therapeutic regimen will improve Outcome: Progressing Goal: Individualized Educational Video(s) Outcome: Progressing   Problem: Activity: Goal: Ability to tolerate increased activity will improve Outcome: Progressing Goal: Will verbalize the importance of balancing activity with adequate rest periods Outcome: Progressing   Problem: Respiratory: Goal: Ability to maintain a clear airway will improve Outcome: Progressing Goal: Levels of oxygenation will improve Outcome: Progressing Goal: Ability to maintain adequate ventilation will improve Outcome: Progressing   Problem: Education: Goal: Ability to describe self-care measures that may prevent or decrease complications (Diabetes Survival Skills Education) will improve Outcome: Progressing Goal: Individualized Educational Video(s) Outcome: Progressing   Problem: Coping: Goal: Ability to adjust to condition or change in health will improve Outcome: Progressing   Problem: Fluid Volume: Goal: Ability to maintain a balanced intake and output will improve Outcome: Progressing   Problem: Health Behavior/Discharge Planning: Goal: Ability to identify and utilize available resources and services will improve Outcome: Progressing Goal: Ability to manage health-related needs will improve Outcome: Progressing   Problem: Metabolic: Goal: Ability to maintain appropriate glucose levels will improve Outcome: Progressing   Problem: Nutritional: Goal: Maintenance of adequate nutrition will improve Outcome: Progressing Goal: Progress toward achieving an optimal weight will improve Outcome: Progressing   Problem: Skin Integrity: Goal: Risk for impaired skin integrity will decrease Outcome: Progressing   Problem: Tissue Perfusion: Goal: Adequacy of tissue perfusion will improve Outcome: Progressing    Problem: Education: Goal: Knowledge of General Education information will improve Description: Including pain rating scale, medication(s)/side effects and non-pharmacologic comfort measures Outcome: Progressing   Problem: Health Behavior/Discharge Planning: Goal: Ability to manage health-related needs will improve Outcome: Progressing   Problem: Clinical Measurements: Goal: Ability to maintain clinical measurements within normal limits will improve Outcome: Progressing Goal: Will remain free from infection Outcome: Progressing Goal: Diagnostic test results will improve Outcome: Progressing Goal: Respiratory complications will improve Outcome: Progressing Goal: Cardiovascular complication will be avoided Outcome: Progressing   Problem: Activity: Goal: Risk for activity intolerance will decrease Outcome: Progressing   Problem: Nutrition: Goal: Adequate nutrition will be maintained Outcome: Progressing   Problem: Coping: Goal: Level of anxiety will decrease Outcome: Progressing   Problem: Elimination: Goal: Will not experience complications related to bowel motility Outcome: Progressing Goal: Will not experience complications related to urinary retention Outcome: Progressing   Problem: Pain Managment: Goal: General experience of comfort will improve Outcome: Progressing   Problem: Safety: Goal: Ability to remain free from injury will improve Outcome: Progressing   Problem: Skin Integrity: Goal: Risk for impaired skin integrity will decrease Outcome: Progressing   Problem: Activity: Goal: Ability to tolerate increased activity will improve Outcome: Progressing   Problem: Respiratory: Goal: Ability to maintain a clear airway and adequate ventilation will improve Outcome: Progressing   Problem: Role Relationship: Goal: Method of communication will improve Outcome: Progressing   Problem: Safety: Goal: Non-violent Restraint(s) Outcome: Progressing

## 2023-01-02 ENCOUNTER — Ambulatory Visit (INDEPENDENT_AMBULATORY_CARE_PROVIDER_SITE_OTHER): Payer: Medicaid Other

## 2023-01-02 ENCOUNTER — Encounter
Payer: Medicaid Other | Attending: Physical Medicine and Rehabilitation | Admitting: Physical Medicine and Rehabilitation

## 2023-01-02 ENCOUNTER — Telehealth: Payer: Self-pay

## 2023-01-02 ENCOUNTER — Encounter: Payer: Self-pay | Admitting: Physical Medicine and Rehabilitation

## 2023-01-02 VITALS — BP 149/90 | Ht 73.0 in | Wt 234.0 lb

## 2023-01-02 DIAGNOSIS — Z5181 Encounter for therapeutic drug level monitoring: Secondary | ICD-10-CM | POA: Diagnosis not present

## 2023-01-02 DIAGNOSIS — I442 Atrioventricular block, complete: Secondary | ICD-10-CM

## 2023-01-02 DIAGNOSIS — Z980 Intestinal bypass and anastomosis status: Secondary | ICD-10-CM

## 2023-01-02 DIAGNOSIS — Z79891 Long term (current) use of opiate analgesic: Secondary | ICD-10-CM | POA: Diagnosis not present

## 2023-01-02 DIAGNOSIS — M109 Gout, unspecified: Secondary | ICD-10-CM | POA: Diagnosis not present

## 2023-01-02 DIAGNOSIS — G894 Chronic pain syndrome: Secondary | ICD-10-CM | POA: Diagnosis not present

## 2023-01-02 DIAGNOSIS — J189 Pneumonia, unspecified organism: Secondary | ICD-10-CM | POA: Insufficient documentation

## 2023-01-02 NOTE — Progress Notes (Addendum)
Subjective:    Patient ID: Luis [name], male    DOB: 30-Jan-1961, 62 y.o.   MRN: [id number]  HPI  Luis [name] is a 62 year old man who presents to establish care for chronic pain.  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 General activity 7 Relation with others 8 Enjoyment of life 9 What TIME of day is your pain at its worst? varies Sleep (in general) Poor  Pain is worse with: walking, bending, and some activites Pain improves with: medication Relief from Meds: 6  walk with assistance ability to climb steps?  yes do you drive?  yes  not employed: date last employed    weakness numbness tremor dizziness confusion depression anxiety loss of taste or smell suicidal thoughts  X ray  New patient    Family History  Problem Relation Age of Onset   COPD Mother    Prostate cancer Father    Colon cancer Neg Hx    Rectal cancer Neg Hx    Stomach cancer Neg Hx    Esophageal cancer Neg Hx    Social History   Socioeconomic History   Marital status: Single    Spouse name: Not on file   Number of children: Not on file   Years of education: Not on file   Highest education level: Not on file  Occupational History   Occupation: Pt states not able to work  Tobacco Use   Smoking status: Every Day    Current packs/day: 1.00    Average packs/day: 1 pack/day for 44.0 years (44.0 ttl pk-yrs)    Types: Cigarettes   Smokeless tobacco: Current  Vaping Use   Vaping status: Never Used  Substance and Sexual Activity   Alcohol use: Yes    Comment: 1-24 pt drinks 5 drinks a week   Drug use: Yes    Types: Marijuana    Sexual activity: Not Currently  Other Topics Concern   Not on file  Social History Narrative   Not on file   Social Determinants of Health   Financial Resource Strain: Not on file  Food Insecurity: Patient Unable To Answer (12/23/2022)   Hunger Vital Sign    Worried About Running Out of Food in the Last Year: Patient unable to answer    Ran Out of Food in the Last Year: Patient unable to answer  Transportation Needs: Patient Unable To Answer (12/23/2022)   PRAPARE - Transportation    Lack of Transportation (Medical): Patient unable to answer    Lack of Transportation (Non-Medical): Patient unable to answer  Physical Activity: Not on file  Stress: Not on file  Social Connections: Not on file   Past Surgical History:  Procedure Laterality Date   COLOSTOMY     PACEMAKER LEADLESS INSERTION N/A 12/31/2020   Procedure: PACEMAKER LEADLESS INSERTION;  Surgeon: [name], MD;  Location: [facility];  Service: Cardiovascular;  Laterality: N/A;   TONSILLECTOMY     Past Medical History:  Diagnosis Date   Diabetes mellitus without complication (HCC)    Diverticulitis  DVT (deep venous thrombosis) (HCC)    x3   ETOH abuse    H/O colectomy    Hyperlipidemia    Hypertension    Smoker    BP (!) 149/90   Ht 6\' 1"  (1.854 m)   Wt 234 lb (106.1 kg)   BMI 30.87 kg/m   Opioid Risk Score:   Fall Risk Score:  `1  Depression screen [facility] 2/9     11/04/2022   10:51 AM  Depression screen PHQ 2/9  Decreased Interest 0  Down, Depressed, Hopeless 0  PHQ - 2 Score 0    Review of Systems  Gastrointestinal:        Ileostomy   Musculoskeletal:        RT foot  All other systems reviewed and are negative.      Objective:   Physical Exam Gen: no distress, normal appearing HEENT: oral mucosa pink and moist, NCAT Cardio: Reg rate Chest: normal effort, normal rate of breathing Abd: soft, non-distended Ext: no edema Psych: pleasant, normal affect Skin: intact Neuro:  Alert and oriented x3      Assessment & Plan:  1) Chronic pain secondary to his ostomy site: --Provided with a pain relief journal and discussed that it contains foods and lifestyle tips to naturally help to improve pain. Discussed that these lifestyle strategies are also very good for health unlike some medications which can have negative side effects. Discussed that the act of keeping a journal can be therapeutic and helpful to realize patterns what helps to trigger and alleviate pain.    -continue tylenol  -discussed that if urine sample is within normal limits, I will prescribe 50mg  tramadol q6H prn  -discussed plan for surgery to put his stomach back together  2) Gout:  -recommended tart cherry juice  3) Pneumonia: -discussed recent hospitalization

## 2023-01-02 NOTE — Transitions of Care (Post Inpatient/ED Visit) (Signed)
   01/02/2023  Name: Luis [name] MRN: [id number] DOB: 04-08-1961  Today's TOC FU Call Status: Today's TOC FU Call Status:: Unsuccessul Call (1st Attempt) Unsuccessful Call (1st Attempt) Date: 01/02/23  Attempted to reach the patient regarding the most recent Inpatient/ED visit.  Follow Up Plan: Additional outreach attempts will be made to reach the patient to complete the Transitions of Care (Post Inpatient/ED visit) call.   Abelino Derrick, MHA [facility]  Managed [facility] Social Worker [phone]

## 2023-01-03 ENCOUNTER — Telehealth: Payer: Self-pay

## 2023-01-03 NOTE — Transitions of Care (Post Inpatient/ED Visit) (Signed)
   01/03/2023  Name: Luis [name] MRN: [id number] DOB: February 15, 1961  Today's TOC FU Call Status: Today's TOC FU Call Status:: Unsuccessful Call (2nd Attempt) Unsuccessful Call (2nd Attempt) Date: 01/03/23  Attempted to reach the patient regarding the most recent Inpatient/ED visit.  Follow Up Plan: Additional outreach attempts will be made to reach the patient to complete the Transitions of Care (Post Inpatient/ED visit) call.   Abelino Derrick, MHA [facility]  Managed [facility] Social Worker [phone]

## 2023-01-04 LAB — CUP PACEART REMOTE DEVICE CHECK
Battery Remaining Longevity: 94 mo
Battery Voltage: 3.02 V
Brady Statistic AS VP Percent: 97.4 %
Brady Statistic AS VS Percent: 0.03 %
Brady Statistic RV Percent Paced: 99.85 %
Date Time Interrogation Session: 20240723155656
Implantable Pulse Generator Implant Date: 20220721
Lead Channel Impedance Value: 530 Ohm
Lead Channel Pacing Threshold Amplitude: 0.375 V
Lead Channel Pacing Threshold Pulse Width: 0.24 ms
Lead Channel Sensing Intrinsic Amplitude: 20.925 mV
Lead Channel Setting Pacing Amplitude: 0.875
Lead Channel Setting Pacing Pulse Width: 0.24 ms
Lead Channel Setting Sensing Sensitivity: 2 mV

## 2023-01-05 ENCOUNTER — Telehealth: Payer: Self-pay | Admitting: Nurse Practitioner

## 2023-01-05 ENCOUNTER — Other Ambulatory Visit: Payer: Self-pay | Admitting: Nurse Practitioner

## 2023-01-05 ENCOUNTER — Other Ambulatory Visit (HOSPITAL_COMMUNITY): Payer: Self-pay

## 2023-01-05 NOTE — Telephone Encounter (Signed)
Please start prior authorization for both Semglee insulin pen and Brovana nebulizer

## 2023-01-05 NOTE — Telephone Encounter (Signed)
Will forward to Rx Prior Auth team../lmb

## 2023-01-06 ENCOUNTER — Ambulatory Visit (HOSPITAL_COMMUNITY)
Admission: RE | Admit: 2023-01-06 | Discharge: 2023-01-06 | Disposition: A | Discharge status: Home / Self Care | Payer: Medicaid Other | Source: Ambulatory Visit | Attending: Nurse Practitioner | Admitting: Nurse Practitioner

## 2023-01-06 ENCOUNTER — Other Ambulatory Visit (HOSPITAL_COMMUNITY): Payer: Self-pay

## 2023-01-06 ENCOUNTER — Telehealth: Payer: Self-pay

## 2023-01-06 DIAGNOSIS — E119 Type 2 diabetes mellitus without complications: Secondary | ICD-10-CM | POA: Diagnosis not present

## 2023-01-06 DIAGNOSIS — K9413 Enterostomy malfunction: Secondary | ICD-10-CM | POA: Diagnosis not present

## 2023-01-06 DIAGNOSIS — Z932 Ileostomy status: Secondary | ICD-10-CM | POA: Diagnosis not present

## 2023-01-06 DIAGNOSIS — S31109A Unspecified open wound of abdominal wall, unspecified quadrant without penetration into peritoneal cavity, initial encounter: Secondary | ICD-10-CM | POA: Diagnosis not present

## 2023-01-06 DIAGNOSIS — Z794 Long term (current) use of insulin: Secondary | ICD-10-CM | POA: Diagnosis not present

## 2023-01-06 DIAGNOSIS — K9419 Other complications of enterostomy: Secondary | ICD-10-CM | POA: Diagnosis not present

## 2023-01-06 DIAGNOSIS — T148XXA Other injury of unspecified body region, initial encounter: Secondary | ICD-10-CM | POA: Diagnosis not present

## 2023-01-06 DIAGNOSIS — Z7984 Long term (current) use of oral hypoglycemic drugs: Secondary | ICD-10-CM | POA: Insufficient documentation

## 2023-01-06 DIAGNOSIS — Z7689 Persons encountering health services in other specified circumstances: Secondary | ICD-10-CM | POA: Diagnosis not present

## 2023-01-06 DIAGNOSIS — J9690 Respiratory failure, unspecified, unspecified whether with hypoxia or hypercapnia: Secondary | ICD-10-CM | POA: Diagnosis not present

## 2023-01-06 DIAGNOSIS — L24B3 Irritant contact dermatitis related to fecal or urinary stoma or fistula: Secondary | ICD-10-CM | POA: Diagnosis not present

## 2023-01-06 DIAGNOSIS — I509 Heart failure, unspecified: Secondary | ICD-10-CM | POA: Insufficient documentation

## 2023-01-06 DIAGNOSIS — Z950 Presence of cardiac pacemaker: Secondary | ICD-10-CM | POA: Insufficient documentation

## 2023-01-06 NOTE — Telephone Encounter (Signed)
Pharmacy Patient Advocate Encounter   Received notification from Pt Calls Messages that prior authorization for Semglee (yfgn) 100UNIT/ML solution is required/requested.   Insurance verification completed.   The patient is insured through [facility] [place] .   Per test claim: PA required; PA submitted to [facility] [place] [employer] via CoverMyMeds Key/confirmation #/[id number] Status is pending

## 2023-01-06 NOTE — Telephone Encounter (Signed)
Pharmacy Patient Advocate Encounter  Received notification from [facility] that Prior Authorization for Semglee (yfgn) 100UNIT/ML solution has been DENIED. Please advise how you'd like to proceed. Full denial letter will be uploaded to the media tab. See denial reason below.  PA #/Case ID/Reference #: [id number]  Denied. Per the health plan's preferred drug list, at least 1 preferred drug must be tried before requesting this drug or tell us why the member cannot try any preferred alternatives. Please send [place] supporting chart notes and lab results. Here is list of preferred alternatives: insulin glargine vial / SoloStar (authorized biologic for Lantus), Lantus (SoloStar or vial), Levemir (FlexPen, FlexTouch, or vial). Note: Some preferred drug(s) may have quantity limits. Refer to the health plan's preferred drug list for additional details.

## 2023-01-06 NOTE — Telephone Encounter (Signed)
PA request for Rosalyn Gess has been Submitted. New Encounter created for follow up. For additional info see Pharmacy Prior Auth telephone encounter from 01/06/23.   PA request for [facility] has been Submitted. New Encounter created for follow up. For additional info see Pharmacy Prior Auth telephone encounter from 01/06/23.

## 2023-01-06 NOTE — Progress Notes (Signed)
[facility]   Reason for visit:  RLQ ileostomy Skin breakdown abdomen, improving Nonhealing surgical wound HPI:  Recent hospitalization for CAP and respiratory failure.  CHF exacerbation and acute ETOH withdrawal.   Past Medical History:  Diagnosis Date  . Diabetes mellitus without complication (HCC)   . Diverticulitis   . DVT (deep venous thrombosis) (HCC)    x3  . ETOH abuse   . H/O colectomy   . Hyperlipidemia   . Hypertension   . Smoker    Family History  Problem Relation Age of Onset  . COPD Mother   . Prostate cancer Father   . Colon cancer Neg Hx   . Rectal cancer Neg Hx   . Stomach cancer Neg Hx   . Esophageal cancer Neg Hx    Allergies  Allergen Reactions  . Lisinopril     Other reaction(s): renal effects  . Codeine Other (See Comments) and Hives    unknown Other reaction(s): Other (See Comments) unknown unknown   Current Outpatient Medications  Medication Sig Dispense Refill Last Dose  . amLODipine (NORVASC) 10 MG tablet Take 10 mg by mouth daily.     Marland Kitchen arformoterol (BROVANA) 15 MCG/2ML NEBU Take 2 mLs (15 mcg total) by nebulization 2 (two) times daily. 120 mL 1   . ascorbic acid (VITAMIN C) 500 MG tablet Take 1 tablet (500 mg total) by mouth daily. 30 tablet 1   . atorvastatin (LIPITOR) 10 MG tablet Take 10 mg by mouth daily.     . colchicine 0.6 MG tablet Take 1 tablet (0.6 mg total) by mouth daily for 14 days. 14 tablet 0   . docusate sodium (COLACE) 100 MG capsule Take 1 capsule (100 mg total) by mouth 2 (two) times daily. 10 capsule 0   . famotidine (PEPCID) 20 MG tablet Take 1 tablet (20 mg total) by mouth 2 (two) times daily. 60 tablet 0   . feeding supplement (ENSURE ENLIVE / ENSURE PLUS) LIQD Take 237 mLs by mouth 3 (three) times daily between meals. 237 mL 12   . fluticasone (FLOVENT HFA) 110 MCG/ACT inhaler Inhale 2 puffs into the lungs 2 (two) times daily. 1 each 2   . folic acid (FOLVITE) 1 MG tablet TAKE 1 TABLET(1 MG) BY MOUTH  DAILY 90 tablet 0   . insulin glargine-yfgn (SEMGLEE) 100 UNIT/ML injection Inject 0.2 mLs (20 Units total) into the skin 2 (two) times daily. 10 mL 11   . metFORMIN (GLUCOPHAGE) 1000 MG tablet Take 0.5 tablets (500 mg total) by mouth 2 (two) times daily with a meal. (Patient taking differently: Take 1,000 mg by mouth daily.) 90 tablet 0   . Multiple Vitamin (MULTIVITAMIN WITH MINERALS) TABS tablet Take 1 tablet by mouth every other day.      . nicotine (NICODERM CQ - DOSED IN MG/24 HOURS) 21 mg/24hr patch Place 1 patch (21 mg total) onto the skin daily. 28 patch 0   . polyethylene glycol (MIRALAX / GLYCOLAX) 17 g packet Take 17 g by mouth daily. 14 each 0   . predniSONE (DELTASONE) 20 MG tablet Take 1 tablet (20 mg total) by mouth daily for 5 days. 5 tablet 0   . thiamine (VITAMIN B1) 100 MG tablet Take 1 tablet (100 mg total) by mouth daily. 90 tablet 0   . torsemide (DEMADEX) 20 MG tablet Take 1 tablet (20 mg total) by mouth daily. 30 tablet 1    No current facility-administered medications for this encounter.  ROS  Review of Systems  Constitutional:  Positive for activity change and fatigue.       Weakness since hospitalization  Respiratory:  Positive for cough and shortness of breath.        Recently treated for pneumonia, acute respiratory failure  Cardiovascular:        Pacemaker CHF  Gastrointestinal:        RLQ ileostomy Nonhealing abdominal wound Contact dermatitis around stoma and abdominal wound from excessive tape use  Endocrine:       Diabetes  Musculoskeletal:  Positive for arthralgias and gait problem.       USes wheelchair in hospital  Skin:  Positive for color change, rash and wound.  All other systems reviewed and are negative. Vital signs:  There were no vitals taken for this visit. Exam:  Physical Exam Vitals reviewed.  Constitutional:      Appearance: He is obese.  Cardiovascular:     Comments: pacemaker Pulmonary:     Breath sounds: Rhonchi present.      Comments: Productive cough Abdominal:     Hernia: A hernia is present.  Musculoskeletal:     Comments: weakness  Skin:    General: Skin is warm and dry.     Findings: Erythema and rash present.  Neurological:     Mental Status: He is oriented to person, place, and time.  Psychiatric:     Comments: Continues to drink and smoke at home.      Stoma type/location:  RLQ ileostomy, some erythema and patient states pouch wont stick well.   Stomal assessment/size:  1", recessed Peristomal assessment:  Erythema from 2 to 6 o'clock, creasing at 5 o'clock.  Filled in with piece of eakin ring Treatment options for stomal/peristomal skin: flat 1 piece pouch with barrier ring, Skin TAC adhesive and barrier seals to pouch perimeter Output: soft green stool,  Ostomy pouching: 1pc. flat Education provided:  POuch applied, patient is concerned about pouch not sticking.  I provide encouragement that pouch is intact at this time and he may have to change more often while skin is denuded and weeping     Impression/dx  Contact dermatitis Midline surgical wound,  improved Discussion  See above, dermatitis WOund care  PEriwound skin improving to abdomen with VASHE cleanser.   Plan  See back 2 weeks.     Visit time: 45 minutes.   Maple Hudson FNP-BC

## 2023-01-06 NOTE — Telephone Encounter (Signed)
Pharmacy Patient Advocate Encounter   Received notification from Pt Calls Messages that prior authorization for Brovana 15MCG/2ML nebulizer solution is required/requested.   Insurance verification completed.   The patient is insured through [facility] [place] .   Per test claim: PA required; PA submitted to [facility] [place] Medicaid via CoverMyMeds Key/confirmation #/EOC [id number] Status is pending

## 2023-01-06 NOTE — Telephone Encounter (Signed)
Pharmacy Patient Advocate Encounter  Received notification from [facility] Medicaid that Prior Authorization for Brovana 15MCG/2ML nebulizer solution has been DENIED. Please advise how you'd like to proceed. Full denial letter will be uploaded to the media tab. See denial reason below.  PA #/Case ID/Reference #:  [id number]   Denied. The requested drug is not approved by the Food and Drug Administration (FDA) for the treatment of PNEUMONIA UNSPECIFIED ORGANISM in members 22 years of age or older. It is FDA approved for Chronic obstructive pulmonary disease in members 79 years of age or older.

## 2023-01-09 ENCOUNTER — Emergency Department (HOSPITAL_COMMUNITY)
Admission: EM | Admit: 2023-01-09 | Discharge: 2023-01-09 | Disposition: A | Discharge status: Home / Self Care | Payer: Medicaid Other | Attending: Emergency Medicine | Admitting: Emergency Medicine

## 2023-01-09 ENCOUNTER — Other Ambulatory Visit: Payer: Self-pay

## 2023-01-09 ENCOUNTER — Other Ambulatory Visit: Payer: Medicaid Other

## 2023-01-09 DIAGNOSIS — K9400 Colostomy complication, unspecified: Secondary | ICD-10-CM | POA: Diagnosis not present

## 2023-01-09 DIAGNOSIS — Z433 Encounter for attention to colostomy: Secondary | ICD-10-CM | POA: Diagnosis not present

## 2023-01-09 DIAGNOSIS — L089 Local infection of the skin and subcutaneous tissue, unspecified: Secondary | ICD-10-CM | POA: Diagnosis not present

## 2023-01-09 DIAGNOSIS — R208 Other disturbances of skin sensation: Secondary | ICD-10-CM | POA: Diagnosis not present

## 2023-01-09 DIAGNOSIS — I10 Essential (primary) hypertension: Secondary | ICD-10-CM | POA: Diagnosis not present

## 2023-01-09 DIAGNOSIS — Z789 Other specified health status: Secondary | ICD-10-CM | POA: Diagnosis not present

## 2023-01-09 DIAGNOSIS — R58 Hemorrhage, not elsewhere classified: Secondary | ICD-10-CM | POA: Diagnosis not present

## 2023-01-09 MED ORDER — LOPERAMIDE HCL 2 MG PO CAPS
4.0000 mg | ORAL_CAPSULE | Freq: Once | ORAL | Status: AC
  Administered 2023-01-09: 4 mg via ORAL
  Filled 2023-01-09: qty 2

## 2023-01-09 MED ORDER — ACETAMINOPHEN 500 MG PO TABS
1000.0000 mg | ORAL_TABLET | Freq: Once | ORAL | Status: AC
  Administered 2023-01-09: 1000 mg via ORAL
  Filled 2023-01-09: qty 2

## 2023-01-09 MED ORDER — ZINC OXIDE 40 % EX OINT
TOPICAL_OINTMENT | Freq: Once | CUTANEOUS | Status: DC
  Filled 2023-01-09: qty 57

## 2023-01-09 NOTE — ED Notes (Signed)
Wound care  and ostomy performed on the pt.pt was not satisfied with the way it was done and changed some of the dressing. Pt states "poop is running down the crease in my belly and coming out under. Its not the ostomy leaking."

## 2023-01-09 NOTE — ED Notes (Signed)
RN provided pt with 8 soft convex and 8 barrier rings for home.

## 2023-01-09 NOTE — ED Notes (Signed)
Soft convex 8  Barrier  8   ordered from material

## 2023-01-09 NOTE — Telephone Encounter (Signed)
Noted../lmb 

## 2023-01-09 NOTE — Discharge Instructions (Addendum)
You were seen in the ER for skin irritation around your ostomy.  We were able to replace it and we have ordered you supplies in case your shipment does not arrive in time.   We are unable to specifically help you get help around your home from the emergency room, but I recommend following up with your primary care provider regarding hiring necessary help. I have attached some resources you can investigate and contact.  Continue to monitor how you're doing and return to the ER for new or worsening symptoms.

## 2023-01-09 NOTE — ED Provider Notes (Signed)
[facility]-EMERGENCY DEPT [facility] Emergency Department Provider Note MRN:  [id number]  Arrival date & time: 01/09/23     Chief Complaint   skin infection   History of Present Illness   Luis [name] is a 62 y.o. year-old male presents to the ED with chief complaint of colostomy problem.  States that he has had some difficulty getting his colostomy to stick.  This has caused leaking and subsequent irritation of the skin around the stoma.  He denies fevers or chills.  Of note, he was admitted to the hospital 2 weeks ago and ended up leaving AMA 1 week ago.  He states that he feels markedly improved since then.  His only complaint tonight is needing help with his colostomy bag and the skin irritation.  History provided by patient.   Review of Systems  Pertinent positive and negative review of systems noted in HPI.    Physical Exam   Vitals:   01/09/23 0504 01/09/23 0604  BP:    Pulse: 96   Resp:    Temp:  97.8 F (36.6 C)  SpO2: 96%     CONSTITUTIONAL:  non toxic-appearing, NAD NEURO:  Alert and oriented x 3, CN 3-12 grossly intact EYES:  eyes equal and reactive ENT/NECK:  Supple, no stridor  CARDIO:  normal rate, regular rhythm, appears well-perfused  PULM:  No respiratory distress, CTAB GI/GU:  non-distended, colostomy in RLQ MSK/SPINE:  No gross deformities, no edema, moves all extremities  SKIN:  erythema surrounding stoma that appears consistent with contact dermatitis.  There is also chronic skin changes to his left and central abdomen, for which he sees wound care.    *Additional and/or pertinent findings included in MDM below  Diagnostic and Interventional Summary    EKG Interpretation Date/Time:    Ventricular Rate:    PR Interval:    QRS Duration:    QT Interval:    QTC Calculation:   R Axis:      Text Interpretation:         Labs Reviewed - No data to display  No orders to display    Medications  liver oil-zinc oxide (DESITIN) 40 % ointment (  Topical Not Given 01/09/23 0603)  loperamide (IMODIUM) capsule 4 mg (has no administration in time range)     Procedures  /  Critical Care Procedures  ED Course and Medical Decision Making  I have reviewed the triage vital signs, the nursing notes, and pertinent available records from the EMR.  Social Determinants Affecting Complexity of Care: Patient has no clinically significant social determinants affecting this chief complaint..   ED Course:    Medical Decision Making Patient here with leaking colostomy bag.  We have attempted to replace this in the ED, but have been unsuccessful at getting it to seal.  This is causing contact dermatitis.  Will trial a dose of Imodium to help decrease the liquid output.  Consult to wound care/ostomy pending.  Risk OTC drugs. Prescription drug management.         Consultants: Consult to wound care/ostomy pending.   Treatment and Plan: Patient signed out to oncoming team at shift change.  Will need to be evaluated by wound care/ostomy RN.    Final Clinical Impressions(s) / ED Diagnoses     ICD-10-CM   1. Colostomy complication (HCC)  K94.00       ED Discharge Orders     None         Discharge Instructions Discussed with and  Provided to Patient:   Discharge Instructions   None      Roxy Horseman, Cordelia Poche 01/09/23 1478    Dione Booze, MD 01/09/23 [phone]

## 2023-01-09 NOTE — ED Notes (Signed)
WOC RN at bedside 

## 2023-01-09 NOTE — ED Notes (Signed)
NS ordering supplies for pt to have at home

## 2023-01-09 NOTE — ED Triage Notes (Signed)
Pt BIB [facility] from home. Pt c/o of Colostomy pain. Pt reports skin infection around the stoma causing irritation around the stoma and across lower abdomen.

## 2023-01-09 NOTE — ED Provider Notes (Signed)
Accepted handoff at shift change from [facility]. Please see prior provider note for full HPI.  Briefly: Patient is a 62 y.o. male who presents to the ER for skin irritation around stoma. Difficulty securing ostomy.  DDX/Plan: Plan for evaluation and treatment by wound care/ostomy staff when available, likely around 8am. Anticipate d/c afterwards.   ED Course / MDM    Medical Decision Making Risk OTC drugs. Prescription drug management.  4098 -- Wound care RN at bedside.  0840 -- Wound care RN was able to replace ostomy but is not hopeful it will hold well. Recommending ordering specific ostomy supplies for home as patient is still waiting on his shipment. Order placed per [facility] RN. Patient was requesting speaking to social work regarding needing assistance with errands and chores around the home.   Emergency department workup does not suggest an emergent condition requiring admission or immediate intervention beyond what has been performed at this time. The plan is: discharge to home with wound care supplies. Given resource guide for in home care per patient request. The patient is safe for discharge and has been instructed to return immediately for worsening symptoms, change in symptoms or any other concerns.   Su Monks, PA-C 01/09/23 0902    Melene Plan, DO 01/09/23 [phone]

## 2023-01-09 NOTE — Consult Note (Signed)
WOC Nurse ostomy consult note; patient with ileostomy for 4 years  Stoma type/location: RLQ ileostomy  Stomal assessment/size: 1 1/4" round slightly above skin level, red moist with granulomas  Peristomal assessment: macerated, partial thickness skin loss, erythematous  Treatment options for stomal/peristomal skin:  crusted with stoma powder and no sting cavilon barrier film; placed silver alginate underneath a 2" barrier ring right around stoma  Output  soft brown stool approximately 100 mls in bag  Ostomy pouching: 1pc convex #[id number] and 2" barrier ring [phone]  Education provided:  patient is well versed on how to care for ostomy but has gotten into a cycle of loose water stool that has led to breakdown of surrounding skin and leakage, now unable to get a pouch to stick.  Patient uses a flat flexible at home.  Is followed at ostomy clinic. I did place patient in a convex 1 piece with a belt after crusting the area with stoma powder and no sting barrier wipe.    Enrolled patient in [employer] DC program: no, longstanding ostomy   Patient has no ostomy pouches at home.  I did order (8) of the [id number] and (8) [phone] and provided him with ostomy belt.  Patient states he has lots of stoma powder and no sting barrier wipes at home to use to crust skin. Patient to follow-up at ostomy clinic as scheduled.   Thank you,    Priscella Mann MSN, RN-BC, [employer] [phone]

## 2023-01-10 ENCOUNTER — Other Ambulatory Visit: Payer: Self-pay | Admitting: Nurse Practitioner

## 2023-01-10 ENCOUNTER — Telehealth: Payer: Self-pay | Admitting: *Deleted

## 2023-01-10 DIAGNOSIS — F10230 Alcohol dependence with withdrawal, uncomplicated: Secondary | ICD-10-CM

## 2023-01-10 DIAGNOSIS — I5033 Acute on chronic diastolic (congestive) heart failure: Secondary | ICD-10-CM

## 2023-01-10 DIAGNOSIS — E1169 Type 2 diabetes mellitus with other specified complication: Secondary | ICD-10-CM

## 2023-01-10 NOTE — Telephone Encounter (Addendum)
Per Dr Carlis Abbott request patient notified by MyChart letter of non narcotic treatment.  ----- Message from Horton Chin sent at 01/08/2023 12:59 PM EDT ----- Regarding: FW: Please let patient know we cannot prescribe opioids due to presence of alcohol in his urine sample ----- Message ----- From: Interface, [facility] Lab Results In Sent: 01/07/2023   5:37 AM EDT To: Horton Chin, MD

## 2023-01-11 ENCOUNTER — Telehealth: Payer: Self-pay | Admitting: *Deleted

## 2023-01-11 NOTE — Telephone Encounter (Signed)
Patient calling to get rx Tramadol. Patient notified per Dr. Carlis Abbott she will not be able to prescribe opiods due to presence of ETOH in drug screen.

## 2023-01-12 ENCOUNTER — Telehealth: Payer: Self-pay | Admitting: Nurse Practitioner

## 2023-01-12 ENCOUNTER — Ambulatory Visit: Payer: Medicaid Other | Admitting: Nurse Practitioner

## 2023-01-12 DIAGNOSIS — Z419 Encounter for procedure for purposes other than remedying health state, unspecified: Secondary | ICD-10-CM | POA: Diagnosis not present

## 2023-01-12 NOTE — Telephone Encounter (Signed)
Called pt and reschedule him for the 8th of August, pt missed today's appointment due misunderstanding. Pt assumed it was on the 01/13/23

## 2023-01-12 NOTE — Progress Notes (Signed)
Remote pacemaker transmission.   

## 2023-01-12 NOTE — Telephone Encounter (Signed)
Please call patient and see if we can reschedule his visit that he missed today.

## 2023-01-17 ENCOUNTER — Ambulatory Visit (HOSPITAL_COMMUNITY): Payer: Medicaid Other | Admitting: Nurse Practitioner

## 2023-01-19 ENCOUNTER — Encounter: Payer: Self-pay | Admitting: Nurse Practitioner

## 2023-01-19 ENCOUNTER — Ambulatory Visit: Payer: Medicaid Other | Admitting: Nurse Practitioner

## 2023-01-19 NOTE — Telephone Encounter (Signed)
This encounter was created in error - please disregard.

## 2023-01-20 ENCOUNTER — Ambulatory Visit: Payer: Medicaid Other | Admitting: Nurse Practitioner

## 2023-01-20 ENCOUNTER — Encounter: Payer: Self-pay | Admitting: Nurse Practitioner

## 2023-01-20 ENCOUNTER — Ambulatory Visit (HOSPITAL_COMMUNITY): Payer: Medicaid Other | Admitting: Nurse Practitioner

## 2023-01-20 VITALS — BP 118/80 | HR 96 | Temp 98.1°F | Ht 71.0 in | Wt 232.2 lb

## 2023-01-20 DIAGNOSIS — Z7984 Long term (current) use of oral hypoglycemic drugs: Secondary | ICD-10-CM

## 2023-01-20 DIAGNOSIS — Z125 Encounter for screening for malignant neoplasm of prostate: Secondary | ICD-10-CM

## 2023-01-20 DIAGNOSIS — F10230 Alcohol dependence with withdrawal, uncomplicated: Secondary | ICD-10-CM

## 2023-01-20 DIAGNOSIS — E1169 Type 2 diabetes mellitus with other specified complication: Secondary | ICD-10-CM

## 2023-01-20 DIAGNOSIS — I5042 Chronic combined systolic (congestive) and diastolic (congestive) heart failure: Secondary | ICD-10-CM | POA: Diagnosis not present

## 2023-01-20 DIAGNOSIS — I5032 Chronic diastolic (congestive) heart failure: Secondary | ICD-10-CM | POA: Insufficient documentation

## 2023-01-20 DIAGNOSIS — E1142 Type 2 diabetes mellitus with diabetic polyneuropathy: Secondary | ICD-10-CM | POA: Insufficient documentation

## 2023-01-20 DIAGNOSIS — E785 Hyperlipidemia, unspecified: Secondary | ICD-10-CM

## 2023-01-20 LAB — COMPREHENSIVE METABOLIC PANEL
ALT: 33 U/L (ref 0–53)
AST: 26 U/L (ref 0–37)
Albumin: 4.6 g/dL (ref 3.5–5.2)
Alkaline Phosphatase: 147 U/L — ABNORMAL HIGH (ref 39–117)
BUN: 19 mg/dL (ref 6–23)
CO2: 30 mEq/L (ref 19–32)
Calcium: 10.7 mg/dL — ABNORMAL HIGH (ref 8.4–10.5)
Chloride: 94 mEq/L — ABNORMAL LOW (ref 96–112)
Creatinine, Ser: 1.03 mg/dL (ref 0.40–1.50)
GFR: 77.33 mL/min (ref >60.00)
Glucose, Bld: 153 mg/dL — ABNORMAL HIGH (ref 70–99)
Potassium: 4.1 mEq/L (ref 3.5–5.1)
Sodium: 136 mEq/L (ref 135–145)
Total Bilirubin: 0.8 mg/dL (ref 0.2–1.2)
Total Protein: 7.6 g/dL (ref 6.0–8.3)

## 2023-01-20 LAB — CBC
HCT: 38.9 % — ABNORMAL LOW (ref 39.0–52.0)
Hemoglobin: 12.8 g/dL — ABNORMAL LOW (ref 13.0–17.0)
MCHC: 33 g/dL (ref 30.0–36.0)
MCV: 104.6 fl — ABNORMAL HIGH (ref 78.0–100.0)
Platelets: 232 10*3/uL (ref 150.0–400.0)
RBC: 3.72 Mil/uL — ABNORMAL LOW (ref 4.22–5.81)
RDW: 15.9 % — ABNORMAL HIGH (ref 11.5–15.5)
WBC: 8.9 10*3/uL (ref 4.0–10.5)

## 2023-01-20 LAB — GAMMA GT: GGT: 699 U/L — ABNORMAL HIGH (ref 7–51)

## 2023-01-20 LAB — PSA: PSA: 0.79 ng/mL (ref 0.10–4.00)

## 2023-01-20 MED ORDER — METFORMIN HCL 500 MG PO TABS
ORAL_TABLET | ORAL | 6 refills | Status: AC
Start: 2023-01-20 — End: ?

## 2023-01-20 MED ORDER — DAPAGLIFLOZIN PROPANEDIOL 10 MG PO TABS: 10.0000 mg | ORAL_TABLET | Freq: Every day | ORAL | 2 refills | Status: DC

## 2023-01-20 NOTE — Progress Notes (Addendum)
Established Patient Office Visit  Subjective   Patient ID: Luis [name], male    DOB: [birth year]  Age: 62 y.o. MRN: [id number]  Chief Complaint  Patient presents with   Diabetes   62 year old male with past medical history significant for type 2 diabetes, hyperlipidemia, third-degree AV block, status post permanent pacemaker placement, hypertension, alcohol abuse, diverticulitis with perforation resulting in colectomy and chronic ostomy, chronic non healing abdominal wound, DVT.  Patient arrives today for management of his diabetes.  Recently he was hospitalized for acute respiratory failure secondary to pneumonia as well as acute on chronic congestive heart failure.   He presented to the hospital on 12/23/2022 after he developed a productive cough about 2 to 3 days prior to presentation.  When he called EMS they found patient's oxygen saturation down the 50s.  He was started on CPAP and given IV Solu-Medrol and DuoNebs.  During hospitalization he was managed with antibiotics and BiPAP.  He did eventually require intubation and was transferred to the ICU on 12/25/2022 for.  His stay was also complicated by acute gout.  He underwent echocardiogram during his hospitalization which identified left ventricular ejection fraction of 50 to 55% as well as grade 2 diastolic dysfunction.  He normally follows with Dr. Lalla Brothers as his cardiologist.  He ultimately improved and was able to be discharged on January 01, 2023.  He now presents today for follow-up.  Overall, he reports that he is feeling much better.  His chronic nonhealing wound is still painful at times however he reports that the wound seems to be improving and healing.  He was having difficulty getting his ostomy to adhere to his skin.  He reports that he is now no longer experiencing this issue.  He was referred to dermatology as his surgeon recommended further evaluation of the skin surrounding his chronic wound as they are concerned skin  cancer may be present and playing a role in the difficulty with the healing of wound.  He does have an appointment coming up next month for evaluation with dermatology.  He has type 2 diabetes and A1c is above goal at 8.0.  He is currently being treated with metformin 1000 mg daily as monotherapy.  He was prescribed Semglee upon discharge from the most recent hospital stay, however insurance denied paying for this.  He continues on atorvastatin.      ROS: see HPI    Objective:     BP 118/80   Pulse 96   Temp 98.1 F (36.7 C) (Temporal)   Ht 5\' 11"  (1.803 m)   Wt 232 lb 4 oz (105.3 kg)   SpO2 95% Comment: on RA  BMI 32.39 kg/m  BP Readings from Last 3 Encounters:  01/20/23 118/80  01/09/23 (!) 155/96  01/06/23 (!) 138/100   Wt Readings from Last 3 Encounters:  01/20/23 232 lb 4 oz (105.3 kg)  01/09/23 230 lb (104.3 kg)  01/02/23 234 lb (106.1 kg)      Physical Exam Vitals reviewed.  Constitutional:      Appearance: Normal appearance.  HENT:     Head: Normocephalic and atraumatic.  Cardiovascular:     Rate and Rhythm: Normal rate and regular rhythm.  Pulmonary:     Effort: Pulmonary effort is normal.     Breath sounds: Normal breath sounds.  Abdominal:     General: The ostomy site is clean. There is distension.  Musculoskeletal:     Cervical back: Neck supple.  Skin:  General: Skin is warm and dry.       Neurological:     Mental Status: He is alert and oriented to person, place, and time.  Psychiatric:        Mood and Affect: Mood normal.        Behavior: Behavior normal.        Thought Content: Thought content normal.        Judgment: Judgment normal.      No results found for any visits on 01/20/23.     The ASCVD Risk score (Arnett DK, et al., 2019) failed to calculate for the following reasons:   The valid total cholesterol range is 130 to 320 mg/dL    Assessment & Plan:   Problem List Items Addressed This Visit       Cardiovascular and  Mediastinum   Chronic combined systolic and diastolic heart failure (HCC)    Chronic Patient appears euvolemic on exam today. Continue torsemide 20 mg daily.  Check BMP, further recommendations may be made based upon the results. I think patient may benefit from addition of Farxiga, prescription for 10 mg tablets to be taken once a day sent to pharmacy however patient would like for me to consult with his cardiologist before he starts the medication. I have sent a message to Dr. Lalla Brothers to see if he agrees with starting [place].  Patient to be notified once I hear back from Dr. Lalla Brothers. Patient told to call Dr. Lovena Neighbours office to make a follow-up also to discuss his heart failure diagnosis.  Patient reports understanding.      Relevant Medications   dapagliflozin propanediol (FARXIGA) 10 MG TABS tablet     Endocrine   Type 2 diabetes mellitus with hyperlipidemia (HCC) - Primary    Chronic A1c above goal, possibly contributing to his non/slowly healing abdominal wound. I discussed the importance of getting A1c under better control with patient today.  He reports his understanding. Per shared decision making increase metformin to 1000 mg with breakfast and 500 mg with dinner. Will also anticipate starting on Farxiga 10 mg daily, but as stated under plan of care for heart failure patient would like for me to consult with his cardiologist before he starts the medication. I have reached out to Dr. Lalla Brothers to see if he agrees with initiation of Farxiga.  Patient will be notified once I hear back from Dr. Lalla Brothers.  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      Relevant Medications   metFORMIN (GLUCOPHAGE) 500 MG tablet   dapagliflozin propanediol (FARXIGA) 10 MG TABS tablet   Other Relevant Orders   CBC   Comprehensive metabolic panel   Gamma GT   Vitamin B1   PSA     Other   Alcohol dependence with  uncomplicated withdrawal (HCC) (Chronic)    Chronic, patient to continue thiamine, folic acid, multivitamin Labs also ordered for further evaluation, further recommendations may be made based on his results.      Relevant Orders   CBC   Comprehensive metabolic panel   Gamma GT   Vitamin B1   PSA   Prostate cancer screening    Labs ordered, further recommendations may be made based upon these results       Relevant Orders   CBC   Comprehensive metabolic panel   Gamma GT   Vitamin B1   PSA    Return in about 1 month (around  02/20/2023) for F/U with [name].  Total time spent on encounter today was 44 minutes including face-to-face evaluation, review of previous medical records, and development/discussion of treatment plan.   [name], NP

## 2023-01-20 NOTE — Assessment & Plan Note (Signed)
Chronic Patient appears euvolemic on exam today. Continue torsemide 20 mg daily.  Check BMP, further recommendations may be made based upon the results. I think patient may benefit from addition of Farxiga, prescription for 10 mg tablets to be taken once a day sent to pharmacy however patient would like for me to consult with his cardiologist before he starts the medication. I have sent a message to Dr. Lalla Brothers to see if he agrees with starting [place].  Patient to be notified once I hear back from Dr. Lalla Brothers. Patient told to call Dr. Lovena Neighbours office to make a follow-up also to discuss his heart failure diagnosis.  Patient reports understanding.

## 2023-01-20 NOTE — Assessment & Plan Note (Signed)
Labs ordered, further recommendations may be made based upon these results. 

## 2023-01-20 NOTE — Assessment & Plan Note (Addendum)
Chronic A1c above goal, possibly contributing to his non/slowly healing abdominal wound. I discussed the importance of getting A1c under better control with patient today.  He reports his understanding. Per shared decision making increase metformin to 1000 mg with breakfast and 500 mg with dinner. Will also anticipate starting on Farxiga 10 mg daily, but as stated under plan of care for heart failure patient would like for me to consult with his cardiologist before he starts the medication. I have reached out to Dr. Lalla Brothers to see if he agrees with initiation of Farxiga.  Patient will be notified once I hear back from Dr. Lalla Brothers.  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

## 2023-01-20 NOTE — Patient Instructions (Addendum)
Call surgeon at [facility] about whether you need CT or not. Let them know you were hospitalized and need to know when it is scheduled.   Call Dr. Lalla Brothers to schedule an appt: [phone]   We will start the below for medications once I hear back from Dr. Lalla Brothers. I will have staff call you to start this once I hear from him.  Metformin -- I am sending new prescription. Each pill is 500mg . Take 2 pills with breakfast and 1 pill with dinner.   Dapagliflozin (farxiga) - take 1 tablet by mouth every morning with breakfast

## 2023-01-20 NOTE — Assessment & Plan Note (Signed)
Chronic, patient to continue thiamine, folic acid, multivitamin Labs also ordered for further evaluation, further recommendations may be made based on his results.

## 2023-01-23 ENCOUNTER — Telehealth: Payer: Self-pay

## 2023-01-23 ENCOUNTER — Telehealth: Payer: Self-pay | Admitting: Nurse Practitioner

## 2023-01-23 NOTE — Telephone Encounter (Signed)
Patient returned Grace's call and would like a call back at [phone].

## 2023-01-23 NOTE — Telephone Encounter (Signed)
Please call patient and let him know that I heard back from Dr. Lalla Brothers and he also agrees that patient should start the dapagliflozin (farxiga). He should take 1 tablet by mouth every morning with his first meal. He needs to make sure he washes his genital area thoroughly with baths and if he experiences and pain/discomfort in his groin or with urination then he needs to notify me. Otherwise follow-up as scheduled. Let me know if he has any questions.

## 2023-01-23 NOTE — Telephone Encounter (Signed)
Left voice mail for pt to call back in regards to providers notes

## 2023-01-23 NOTE — Telephone Encounter (Signed)
Made pt aware of provider's notes

## 2023-01-23 NOTE — Progress Notes (Signed)
   Care Guide Note  01/23/2023 Name: Luis [name] MRN: [id number] DOB: 10-01-1960  Referred by: [name], NP Reason for referral : Care Management (Outreach to schedule with Pharm d )   Luis [name] is a 62 y.o. year old male who is a primary care patient of [name], NP. Luis [name] was referred to the pharmacist for assistance related to DM.    Successful contact was made with the patient to discuss pharmacy services including being ready for the pharmacist to call at least 5 minutes before the scheduled appointment time, to have medication bottles and any blood sugar or blood pressure readings ready for review. The patient agreed to meet with the pharmacist via with the pharmacist via telephone visit on (date/time).  02/06/2023  Penne Lash, RMA Care Guide [facility]  [place], [place] [address] Direct Dial: [phone] [email or web address]

## 2023-01-24 ENCOUNTER — Ambulatory Visit (HOSPITAL_COMMUNITY): Payer: Medicaid Other | Admitting: Nurse Practitioner

## 2023-01-24 ENCOUNTER — Other Ambulatory Visit (HOSPITAL_COMMUNITY): Payer: Self-pay

## 2023-01-24 ENCOUNTER — Telehealth: Payer: Self-pay

## 2023-01-24 DIAGNOSIS — Z7689 Persons encountering health services in other specified circumstances: Secondary | ICD-10-CM | POA: Diagnosis not present

## 2023-01-24 NOTE — Telephone Encounter (Signed)
Pharmacy Patient Advocate Encounter   Received notification from Pt Calls Messages that prior authorization for Farxiga 10mg  is required/requested.   Insurance verification completed.   The patient is insured through  [employer]  .   Per test claim: PA required; PA started via CoverMyMeds. KEY  [id number] . Waiting for clinical questions to populate.

## 2023-01-24 NOTE — Telephone Encounter (Signed)
Notified pt with [place] Dec response. Pt states the insurance suppose to be sending over prior auth. Iform pt will send to Rx Prior team../lmb

## 2023-01-25 ENCOUNTER — Ambulatory Visit: Payer: Medicaid Other

## 2023-01-25 DIAGNOSIS — Z7689 Persons encountering health services in other specified circumstances: Secondary | ICD-10-CM | POA: Diagnosis not present

## 2023-01-25 NOTE — Progress Notes (Signed)
Patient presents to the office today for diabetic shoe and insole measuring.  Patient was measured with brannock device to determine size and width for 1 pair of extra depth shoes and foam casted for 3 pair of insoles.   Documentation of medical necessity will be sent to patient's treating diabetic doctor to verify and sign.   Patient's diabetic provider: Dr Ralene Cork / treating / Jiles Prows  Shoes and insoles will be ordered at that time and patient will be notified for an appointment for fitting when they arrive.  Shoe size (per patient): 10 Brannock measurement: 10 Patient shoe selection- Shoe choice:   [id number]   Shoe size ordered: 10WD  ABN and Financials signed  Addison Bailey Cped, CFo, CFm

## 2023-01-26 ENCOUNTER — Other Ambulatory Visit (HOSPITAL_COMMUNITY): Payer: Self-pay

## 2023-01-26 ENCOUNTER — Encounter (INDEPENDENT_AMBULATORY_CARE_PROVIDER_SITE_OTHER): Payer: Self-pay

## 2023-01-26 NOTE — Telephone Encounter (Signed)
 Clinical questions answered and PA submitted

## 2023-01-27 ENCOUNTER — Telehealth: Payer: Self-pay | Admitting: Nurse Practitioner

## 2023-01-27 ENCOUNTER — Other Ambulatory Visit (HOSPITAL_COMMUNITY): Payer: Self-pay

## 2023-01-27 NOTE — Telephone Encounter (Signed)
FYI   Pt called about CT scan, wondering when he is supposed to get it done. Explained to pt again that he needs to call the provider who ordered the CT scan to switch from the place they previously wanted him to get it done to [facility] and that we are unable to do that since we do not have access to their system/order.  Made pt aware that the provider from [facility] who ordered the CT scan number was provided on his last visit on his AVS.

## 2023-01-27 NOTE — Telephone Encounter (Signed)
Patient called and said it was his understanding that he was going to get a CT scan. He would like to know if that is going to be ordered by PCP. Patient would like a call back at [phone].

## 2023-01-31 ENCOUNTER — Encounter (HOSPITAL_COMMUNITY)
Admission: RE | Admit: 2023-01-31 | Discharge: 2023-01-31 | Disposition: A | Discharge status: Home / Self Care | Payer: Medicaid Other | Source: Ambulatory Visit

## 2023-01-31 DIAGNOSIS — Z7689 Persons encountering health services in other specified circumstances: Secondary | ICD-10-CM | POA: Diagnosis not present

## 2023-01-31 NOTE — Progress Notes (Signed)
[facility]   Reason for visit:  RLQ ileostomy Abdominal wound, improving Abdominal hernia HPI:  Recent CHF exacerbation Past Medical History:  Diagnosis Date   Acute respiratory failure with hypoxia (HCC) 12/23/2022   Alcohol withdrawal syndrome, with delirium (HCC) 12/27/2022   Diabetes mellitus without complication (HCC)    Diverticulitis    DVT (deep venous thrombosis) (HCC)    x3   Encephalopathy acute 12/28/2022   ETOH abuse    H/O colectomy    Hyperlipidemia    Hypertension    Sepsis associated hypotension (HCC) 08/03/2019   Smoker    Family History  Problem Relation Age of Onset   COPD Mother    Prostate cancer Father    Colon cancer Neg Hx    Rectal cancer Neg Hx    Stomach cancer Neg Hx    Esophageal cancer Neg Hx    Allergies  Allergen Reactions   Lisinopril     Other reaction(s): renal effects   Codeine Other (See Comments) and Hives    unknown Other reaction(s): Other (See Comments) unknown unknown   Current Outpatient Medications  Medication Sig Dispense Refill Last Dose   amLODipine (NORVASC) 10 MG tablet Take 10 mg by mouth daily.      arformoterol (BROVANA) 15 MCG/2ML NEBU Take 2 mLs (15 mcg total) by nebulization 2 (two) times daily. 120 mL 1    ascorbic acid (VITAMIN C) 500 MG tablet Take 1 tablet (500 mg total) by mouth daily. 30 tablet 1    atorvastatin (LIPITOR) 10 MG tablet Take 10 mg by mouth daily.      dapagliflozin propanediol (FARXIGA) 10 MG TABS tablet Take 1 tablet (10 mg total) by mouth daily before breakfast. 30 tablet 2    docusate sodium (COLACE) 100 MG capsule Take 1 capsule (100 mg total) by mouth 2 (two) times daily. 10 capsule 0    famotidine (PEPCID) 20 MG tablet Take 1 tablet (20 mg total) by mouth 2 (two) times daily. 60 tablet 0    feeding supplement (ENSURE ENLIVE / ENSURE PLUS) LIQD Take 237 mLs by mouth 3 (three) times daily between meals. 237 mL 12    fluticasone (FLOVENT HFA) 110 MCG/ACT inhaler Inhale 2  puffs into the lungs 2 (two) times daily. 1 each 2    folic acid (FOLVITE) 1 MG tablet TAKE 1 TABLET(1 MG) BY MOUTH DAILY 90 tablet 0    metFORMIN (GLUCOPHAGE) 500 MG tablet Take 2 tablets by mouth with breakfast and 1 tablet by mouth at dinner 90 tablet 6    Multiple Vitamin (MULTIVITAMIN WITH MINERALS) TABS tablet Take 1 tablet by mouth every other day.       nicotine (NICODERM CQ - DOSED IN MG/24 HOURS) 21 mg/24hr patch Place 1 patch (21 mg total) onto the skin daily. 28 patch 0    polyethylene glycol (MIRALAX / GLYCOLAX) 17 g packet Take 17 g by mouth daily. 14 each 0    thiamine (VITAMIN B1) 100 MG tablet Take 1 tablet (100 mg total) by mouth daily. 90 tablet 0    torsemide (DEMADEX) 20 MG tablet Take 1 tablet (20 mg total) by mouth daily. 30 tablet 1    No current facility-administered medications for this encounter.   ROS  Review of Systems  Constitutional:  Positive for fatigue.       Increased shortness of breath  Respiratory:  Positive for cough and shortness of breath.        Oxygen saturation  93% today.  Rechecked at conclusion of visit, was 95%   Cardiovascular:        Recent CHF exacerbation.  We reviewed all medications.  He is not taking the medication that starts with "d" .  Unclear which one.  I highlight all of his meds and list rationale for which they are prescribed.  He is to check this list against the medications he is taking. I ask him to weight himself prior to his cardiology telehealth visit that is tomorrow. He thinks he is taking torsemide.   Gastrointestinal:        Abdominal hernia RLQ ileostomy  Endocrine:       Diabetes  Musculoskeletal:  Positive for gait problem.  Skin:  Positive for rash and wound.  Psychiatric/Behavioral: Negative.    All other systems reviewed and are negative.  Vital signs:  BP (!) 156/101   Pulse 94   Temp 98.3 F (36.8 C)   Resp 19   SpO2 93%  Exam:  Physical Exam Vitals reviewed.  Constitutional:      Appearance: He is  obese.  Cardiovascular:     Comments: CHF exacerbation, SOB Abdominal:     Hernia: A hernia is present.     Comments: Nonhealing wound to abdomen Abdominal hernia  Skin:    General: Skin is warm and dry.     Findings: Lesion present.  Neurological:     Mental Status: He is alert and oriented to person, place, and time.  Psychiatric:        Mood and Affect: Mood normal.        Behavior: Behavior normal.     Stoma type/location:  RLQ recessed ileostomy Stomal assessment/size:  1" below skin level Peristomal assessment:  intact tender to touch, improved Treatment options for stomal/peristomal skin: barrier ring, 1 piece flat Output: soft brown stool Ostomy pouching: 1pc. Flat  uses skin TAC adhesive for improved seal.  Will not consider convex pouch Education provided:  discussed how peristomal skin looks better.  He indicates it is still painful.  We discuss the use of strong liquid adhesive and how it can irritate skin.  He does not wish to try alternate pouch systems Abdominal wounds are nearly healed.  Distal end of wound, most "stretched" by hernia remains healing, but looks better.  Continue VASHE to abdominal skin daily and aquacel to open wounds.  Change every 3-7 days with pouch change. Cover with ABD pads, minimal tape and supports hernia with ace bandages.     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  Chronic, improved with VASHE and minimizing tape usage Discussion  Continue wound care.  See back as needed Plan  Orders sent in to home care deliverables for wound care supplies.     Visit time: 45 minutes.   Maple Hudson FNP-BC

## 2023-02-02 ENCOUNTER — Telehealth: Payer: Self-pay

## 2023-02-02 NOTE — Telephone Encounter (Signed)
Error

## 2023-02-02 NOTE — Progress Notes (Signed)
   02/02/2023  Patient ID: Luis [name], male   DOB: 1960/12/21, 62 y.o.   MRN: [id number]  Outreach to return patient call to PCP office earlier to speak with clinical pharmacist regarding his upcoming telephone visit 8/26.  I was not able to reach the patient but left HIPAA compliant voicemail with my direct number, so he can return my call at his convenience.  Lenna Gilford, PharmD, DPLA

## 2023-02-06 ENCOUNTER — Other Ambulatory Visit: Payer: Medicaid Other

## 2023-02-06 NOTE — Progress Notes (Signed)
02/06/2023 Name: Luis [name] MRN: [id number] DOB: 1961-02-08  Chief Complaint  Patient presents with   Medication Management   Luis [name] is a 62 y.o. year old male who presented for a telephone visit.   They were referred to the pharmacist by their PCP for assistance in managing diabetes and medication access.   Subjective:  Care Team: Primary Care Provider: [name], NP ; Next Scheduled Visit: 9/19  Medication Access/Adherence  Current Pharmacy:  [id number] - [place], [place] - [address] AT [facility] [address] [facility] [address] [place] [address] Phone: [phone] Fax: [phone]  -Patient reports affordability concerns with their medications: Yes -Insurance is not covering Farxiga 10mg  -Patient reports access/transportation concerns to their pharmacy: No  -Patient reports adherence concerns with their medications:  Yes  Patient continues to take metopfolol xl 50mg  daily, amlodipine 10mg  daily, APAP 650mg , and cetirizine listed to stop at hospital discharge in July.  He never received prescribed Brovana nebulizer solution and does not have home nebulizer.  Diabetes: Current medications: metformin 1000mg  qam and 500mg  qpm -Patient has not been able to pick up or start [place] 10mg  daily, because insurance is requiring prior authorization -He does not check home BG, because he does not know how to use meter/testing supplies -Patient is inquiring about Ozempic for BG control and weight loss  Hypertension: Current medications: amlodipine 10mg  daily, metoprolol xl 50mg  daily, torsemide 20mg  daily  -Medications previously tried: lisinopril caused decline in renal function -Patient does not check home BP -Upon recent hospital discharge, metoprolol xl 50mg  was d/c'ed; but patient has continued to take.  He states this helps with is BP and hand tremor, and he is unsure why provider discontinued therapy. -Discharge summary also states to  "continue to hold amlodipine," but lists this as a medication to take.  Patient has continued to take this medication for BP control  Hyperlipidemia/ASCVD Risk Reduction Current lipid lowering medications: atorvastatin 10mg  daily Antiplatelet regimen: None  Heart Failure: Current medications:  ACEi/ARB/ARNI: Contraindicated (lisinopril caused decline in kidney function previously) SGLT2i: Working on getting [place] 10mg  daily  Beta blocker: Metoprolol XL 50mg  daily  Mineralocorticoid Receptor Antagonist: None Diuretic regimen: Torsemide 20mg  daily   -Patient endorses continued SOB after hospitalization last month for pneumonia.  Unclear if this is HF related or more respiratory, though.  Objective: Lab Results  Component Value Date   HGBA1C 8.0 (H) 11/04/2022   Lab Results  Component Value Date   CREATININE 1.03 01/20/2023   BUN 19 01/20/2023   NA 136 01/20/2023   K 4.1 01/20/2023   CL 94 (L) 01/20/2023   CO2 30 01/20/2023   Lab Results  Component Value Date   CHOL 127 11/04/2022   HDL 32.50 (L) 11/04/2022   LDLCALC 53 12/26/2020   LDLDIRECT 51.0 11/04/2022   TRIG 522 (H) 12/26/2022   CHOLHDL 4 11/04/2022   Medications Reviewed Today     Reviewed by Lenna Gilford, RPH (Pharmacist) on 02/06/23 at 1531  Med List Status: <None>   Medication Order Taking? Sig Documenting Provider Last Dose Status Informant  amLODipine (NORVASC) 10 MG tablet [id number] Yes Take 10 mg by mouth daily. [provider] Taking Active Self, Pharmacy Records  arformoterol [facility]) 15 MCG/2ML NEBU [id number] No Take 2 mLs (15 mcg total) by nebulization 2 (two) times daily.  Patient not taking: Reported on 02/06/2023   Barnetta Chapel, MD Not Taking Active   ascorbic acid (  VITAMIN C) 500 MG tablet [id number] Yes Take 1 tablet (500 mg total) by mouth daily. Barnetta Chapel, MD Taking Active   atorvastatin (LIPITOR) 10 MG tablet [id number] Yes Take 10 mg by mouth daily. [provider] Taking Active Self, Pharmacy Records  dapagliflozin propanediol (FARXIGA) 10 MG TABS tablet [id number] No Take 1 tablet (10 mg total) by mouth daily before breakfast.  Patient not taking: Reported on 02/06/2023   [name], NP Not Taking Active            Med Note Littie Deeds, [facility]   Mon Feb 06, 2023  3:18 PM) Insurance not covering  docusate sodium (COLACE) 100 MG capsule [id number] No Take 1 capsule (100 mg total) by mouth 2 (two) times daily.  Patient not taking: Reported on 02/06/2023   Berton Mount I, MD Not Taking Active   famotidine (PEPCID) 20 MG tablet [id number] Yes Take 1 tablet (20 mg total) by mouth 2 (two) times daily.  Patient taking differently: Take 20 mg by mouth daily.   Barnetta Chapel, MD Taking Active   feeding supplement (ENSURE ENLIVE / ENSURE PLUS) LIQD [id number] No Take 237 mLs by mouth 3 (three) times daily between meals. Barnetta Chapel, MD Unknown Active   fluticasone [facility]) 110 MCG/ACT inhaler [id number] Yes Inhale 2 puffs into the lungs 2 (two) times daily. Barnetta Chapel, MD Taking Active   folic acid (FOLVITE) 1 MG tablet [id number] Yes TAKE 1 TABLET(1 MG) BY MOUTH DAILY [name], MD Taking Active   metFORMIN (GLUCOPHAGE) 500 MG tablet [id number] Yes Take 2 tablets by mouth with breakfast and 1 tablet by mouth at dinner [name], NP Taking Active   Multiple Vitamin (MULTIVITAMIN WITH MINERALS) TABS tablet [id number] Yes Take 1 tablet by mouth every other day.  [provider] Taking Active Self, Pharmacy Records  nicotine (NICODERM CQ - DOSED IN MG/24 HOURS) 21 mg/24hr patch [id number] No Place 1 patch (21 mg total) onto the skin daily.  Patient not taking: Reported on 02/06/2023   Berton Mount I, MD Not Taking Active   polyethylene glycol (MIRALAX / GLYCOLAX) 17 g packet [id number] No Take 17 g by mouth daily.  Patient not taking: Reported on 02/06/2023   Berton Mount I, MD Not Taking Active    thiamine (VITAMIN B1) 100 MG tablet [id number] Yes Take 1 tablet (100 mg total) by mouth daily. [name], NP Taking Active Self, Pharmacy Records  torsemide [facility]) 20 MG tablet [id number] Yes Take 1 tablet (20 mg total) by mouth daily. Barnetta Chapel, MD Taking Active            Assessment/Plan:   Medication Access/Adherence -Patient was taking 8 tablets of APAP 650mg  daily, which is above the recommended daily amount for adults.  I discussed importance of stopping/limiting use especially in setting of elevated liver enzymes.  Patient states ibuprofen is not effective for him, and he is requesting a refill of tramadol until he can be seen by pain management.  I will consult PCP for consideration. -Consulting PCP to inform that patient never received nebulizer solution, and that an order for nebulizer will need to be placed in order for patient to utilize.  Of note, medication will require a prior authorization for coverage on insurance, which I am happy to assist with.  Diabetes: - Currently uncontrolled - Consulting PCP to see if prior authorization for Marcelline Deist is being worked on; if not, I will send to  insurance through CoverMyMeds - Discussed Ozempic, and there are no CI's to therapy such as history of pancreatitis or personal/family history of medullary thyroid cancer.  Therapy could be initiated for further BG control and weight loss.  Would recommend decreased/stopped alcohol use, as this could increase risk of pancreatitis.  Hypertension: - Currently uncontrolled - Recommend continuation of amlodipine 10mg  daily, as I believe this was a typo on discharge summary when stated "continue to hold" - Patient does not check home BP, but has frequent provider visits; and BP consistently elevated - Consulting PCP regarding metoprolol, as I cannot find reasoning behind stopping metoprolol at d/c; and patient likely needs this on board  Hyperlipidemia/ASCVD Risk Reduction: -  Currently moderately controlled with LDL <70 but TG >500 - Recommend dietary modifications; could also consider Lovaza therapy to lower TG's  Heart Failure: - Recommend continuation of current regimen at this time and regular follow-up with cardiology  Follow Up Plan: Consulting PCP and will follow-up with patient with plan  Lenna Gilford, PharmD, DPLA

## 2023-02-07 ENCOUNTER — Other Ambulatory Visit (HOSPITAL_COMMUNITY): Payer: Self-pay

## 2023-02-07 ENCOUNTER — Ambulatory Visit (HOSPITAL_COMMUNITY): Payer: Medicaid Other | Admitting: Nurse Practitioner

## 2023-02-07 NOTE — Telephone Encounter (Signed)
Faxed prior authorization and additional information for Luis [name] to [facility] to [phone].

## 2023-02-08 DIAGNOSIS — S31109A Unspecified open wound of abdominal wall, unspecified quadrant without penetration into peritoneal cavity, initial encounter: Secondary | ICD-10-CM | POA: Diagnosis not present

## 2023-02-08 DIAGNOSIS — Z932 Ileostomy status: Secondary | ICD-10-CM | POA: Diagnosis not present

## 2023-02-09 ENCOUNTER — Other Ambulatory Visit: Payer: Self-pay | Admitting: Nurse Practitioner

## 2023-02-09 DIAGNOSIS — I5042 Chronic combined systolic (congestive) and diastolic (congestive) heart failure: Secondary | ICD-10-CM

## 2023-02-09 DIAGNOSIS — R0602 Shortness of breath: Secondary | ICD-10-CM

## 2023-02-09 DIAGNOSIS — E785 Hyperlipidemia, unspecified: Secondary | ICD-10-CM

## 2023-02-09 DIAGNOSIS — F17200 Nicotine dependence, unspecified, uncomplicated: Secondary | ICD-10-CM

## 2023-02-09 MED ORDER — METOPROLOL SUCCINATE ER 50 MG PO TB24: 50.0000 mg | ORAL_TABLET | Freq: Every day | ORAL | Status: DC

## 2023-02-09 NOTE — Telephone Encounter (Signed)
Send pt my chart message for about approval

## 2023-02-09 NOTE — Telephone Encounter (Signed)
Pharmacy Patient Advocate Encounter  Received notification from [employer] [employer] that Prior Authorization for Farxiga 10mg  has been APPROVED from 02/07/23 to 02/07/24   PA #/Case ID/Reference #: [id number]  Approval letter indexed to media tab

## 2023-02-09 NOTE — Telephone Encounter (Signed)
Good afternoon,  I was reaching out to see if we have updates on the farxiga PA?

## 2023-02-10 ENCOUNTER — Telehealth: Payer: Self-pay | Admitting: Nurse Practitioner

## 2023-02-10 ENCOUNTER — Ambulatory Visit (HOSPITAL_COMMUNITY): Payer: Medicaid Other | Admitting: Nurse Practitioner

## 2023-02-10 ENCOUNTER — Telehealth: Payer: Self-pay

## 2023-02-10 DIAGNOSIS — J189 Pneumonia, unspecified organism: Secondary | ICD-10-CM

## 2023-02-10 MED ORDER — IPRATROPIUM-ALBUTEROL 0.5-2.5 (3) MG/3ML IN SOLN
3.0000 mL | Freq: Four times a day (QID) | RESPIRATORY_TRACT | 0 refills | Status: DC | PRN
Start: 2023-02-10 — End: 2023-05-29

## 2023-02-10 MED ORDER — AMLODIPINE BESYLATE 10 MG PO TABS: 10.0000 mg | ORAL_TABLET | Freq: Every day | ORAL | 0 refills | Status: DC

## 2023-02-10 NOTE — Telephone Encounter (Signed)
Patient called and wants to know if you can prescribe him a nebulizer for pneumonia - please call patient and advise.  [phone]

## 2023-02-10 NOTE — Telephone Encounter (Signed)
Prescription Request  02/10/2023  LOV: 01/20/2023  What is the name of the medication or equipment? amLODipine (NORVASC) 10 MG tablet   Have you contacted your pharmacy to request a refill? No   Which pharmacy would you like this sent to?  [id number] [place], [place] - [address] AT [facility] OF [address] [facility] [address] [place] [address] Phone: [phone] Fax: [phone]    Patient notified that their request is being sent to the clinical staff for review and that they should receive a response within 2 business days.   Please advise at Mobile [phone] (mobile)

## 2023-02-10 NOTE — Telephone Encounter (Signed)
Pt forms have been placed up front.

## 2023-02-10 NOTE — Progress Notes (Signed)
   02/10/2023  Patient ID: Luis [name], male   DOB: 07-Jul-1960, 62 y.o.   MRN: [id number]  Outreach attempt to return patient's call to me earlier, but I was not able to reach him.  I did leave a HIPAA compliant voicemail with my direct number; so he can return my call at his convenience.  Lenna Gilford, PharmD, DPLA

## 2023-02-12 DIAGNOSIS — Z419 Encounter for procedure for purposes other than remedying health state, unspecified: Secondary | ICD-10-CM | POA: Diagnosis not present

## 2023-02-14 ENCOUNTER — Ambulatory Visit: Payer: Medicaid Other | Admitting: Dermatology

## 2023-02-14 NOTE — Telephone Encounter (Signed)
Patient states that he needs this nebulizer.  Please have someone call the patient back - [phone]

## 2023-02-14 NOTE — Telephone Encounter (Signed)
Patient would like to know if the prescription for the nebulizer can be sent to [facility] on [address] in [place]. Best callback is [phone].

## 2023-02-15 ENCOUNTER — Emergency Department (HOSPITAL_COMMUNITY): Payer: Medicaid Other

## 2023-02-15 ENCOUNTER — Inpatient Hospital Stay (HOSPITAL_COMMUNITY)
Admission: EM | Admit: 2023-02-15 | Discharge: 2023-02-20 | DRG: 291 | Disposition: A | Discharge status: Home / Self Care | Payer: Medicaid Other | Attending: Internal Medicine | Admitting: Internal Medicine

## 2023-02-15 DIAGNOSIS — Z6830 Body mass index (BMI) 30.0-30.9, adult: Secondary | ICD-10-CM

## 2023-02-15 DIAGNOSIS — I493 Ventricular premature depolarization: Secondary | ICD-10-CM | POA: Diagnosis present

## 2023-02-15 DIAGNOSIS — Z7951 Long term (current) use of inhaled steroids: Secondary | ICD-10-CM

## 2023-02-15 DIAGNOSIS — I2489 Other forms of acute ischemic heart disease: Secondary | ICD-10-CM | POA: Diagnosis not present

## 2023-02-15 DIAGNOSIS — R0603 Acute respiratory distress: Secondary | ICD-10-CM | POA: Diagnosis not present

## 2023-02-15 DIAGNOSIS — I5031 Acute diastolic (congestive) heart failure: Secondary | ICD-10-CM | POA: Diagnosis not present

## 2023-02-15 DIAGNOSIS — E119 Type 2 diabetes mellitus without complications: Secondary | ICD-10-CM | POA: Diagnosis present

## 2023-02-15 DIAGNOSIS — F17210 Nicotine dependence, cigarettes, uncomplicated: Secondary | ICD-10-CM | POA: Diagnosis present

## 2023-02-15 DIAGNOSIS — I5043 Acute on chronic combined systolic (congestive) and diastolic (congestive) heart failure: Secondary | ICD-10-CM | POA: Diagnosis not present

## 2023-02-15 DIAGNOSIS — Z7984 Long term (current) use of oral hypoglycemic drugs: Secondary | ICD-10-CM | POA: Diagnosis not present

## 2023-02-15 DIAGNOSIS — Z825 Family history of asthma and other chronic lower respiratory diseases: Secondary | ICD-10-CM | POA: Diagnosis not present

## 2023-02-15 DIAGNOSIS — I110 Hypertensive heart disease with heart failure: Secondary | ICD-10-CM | POA: Diagnosis not present

## 2023-02-15 DIAGNOSIS — J440 Chronic obstructive pulmonary disease with acute lower respiratory infection: Secondary | ICD-10-CM | POA: Diagnosis not present

## 2023-02-15 DIAGNOSIS — E1169 Type 2 diabetes mellitus with other specified complication: Secondary | ICD-10-CM | POA: Diagnosis not present

## 2023-02-15 DIAGNOSIS — J441 Chronic obstructive pulmonary disease with (acute) exacerbation: Secondary | ICD-10-CM | POA: Diagnosis not present

## 2023-02-15 DIAGNOSIS — J9601 Acute respiratory failure with hypoxia: Secondary | ICD-10-CM | POA: Diagnosis not present

## 2023-02-15 DIAGNOSIS — I700 Atherosclerosis of aorta: Secondary | ICD-10-CM | POA: Diagnosis not present

## 2023-02-15 DIAGNOSIS — I442 Atrioventricular block, complete: Secondary | ICD-10-CM | POA: Diagnosis present

## 2023-02-15 DIAGNOSIS — E11628 Type 2 diabetes mellitus with other skin complications: Secondary | ICD-10-CM

## 2023-02-15 DIAGNOSIS — I5033 Acute on chronic diastolic (congestive) heart failure: Secondary | ICD-10-CM | POA: Diagnosis not present

## 2023-02-15 DIAGNOSIS — Z8042 Family history of malignant neoplasm of prostate: Secondary | ICD-10-CM | POA: Diagnosis not present

## 2023-02-15 DIAGNOSIS — Z9049 Acquired absence of other specified parts of digestive tract: Secondary | ICD-10-CM

## 2023-02-15 DIAGNOSIS — F17200 Nicotine dependence, unspecified, uncomplicated: Secondary | ICD-10-CM | POA: Diagnosis not present

## 2023-02-15 DIAGNOSIS — I509 Heart failure, unspecified: Secondary | ICD-10-CM

## 2023-02-15 DIAGNOSIS — E1165 Type 2 diabetes mellitus with hyperglycemia: Secondary | ICD-10-CM | POA: Diagnosis not present

## 2023-02-15 DIAGNOSIS — Z888 Allergy status to other drugs, medicaments and biological substances status: Secondary | ICD-10-CM

## 2023-02-15 DIAGNOSIS — R0602 Shortness of breath: Secondary | ICD-10-CM | POA: Diagnosis not present

## 2023-02-15 DIAGNOSIS — Z933 Colostomy status: Secondary | ICD-10-CM | POA: Diagnosis not present

## 2023-02-15 DIAGNOSIS — E669 Obesity, unspecified: Secondary | ICD-10-CM | POA: Diagnosis present

## 2023-02-15 DIAGNOSIS — Z79899 Other long term (current) drug therapy: Secondary | ICD-10-CM | POA: Diagnosis not present

## 2023-02-15 DIAGNOSIS — R0902 Hypoxemia: Secondary | ICD-10-CM | POA: Diagnosis not present

## 2023-02-15 DIAGNOSIS — I10 Essential (primary) hypertension: Secondary | ICD-10-CM | POA: Diagnosis present

## 2023-02-15 DIAGNOSIS — Z1152 Encounter for screening for COVID-19: Secondary | ICD-10-CM | POA: Diagnosis not present

## 2023-02-15 DIAGNOSIS — I5042 Chronic combined systolic (congestive) and diastolic (congestive) heart failure: Secondary | ICD-10-CM

## 2023-02-15 DIAGNOSIS — Z7689 Persons encountering health services in other specified circumstances: Secondary | ICD-10-CM | POA: Diagnosis not present

## 2023-02-15 DIAGNOSIS — F1010 Alcohol abuse, uncomplicated: Secondary | ICD-10-CM | POA: Diagnosis not present

## 2023-02-15 DIAGNOSIS — Z950 Presence of cardiac pacemaker: Secondary | ICD-10-CM | POA: Diagnosis not present

## 2023-02-15 DIAGNOSIS — E785 Hyperlipidemia, unspecified: Secondary | ICD-10-CM | POA: Insufficient documentation

## 2023-02-15 DIAGNOSIS — Z86718 Personal history of other venous thrombosis and embolism: Secondary | ICD-10-CM

## 2023-02-15 DIAGNOSIS — I5023 Acute on chronic systolic (congestive) heart failure: Principal | ICD-10-CM

## 2023-02-15 DIAGNOSIS — R609 Edema, unspecified: Secondary | ICD-10-CM | POA: Diagnosis not present

## 2023-02-15 DIAGNOSIS — R059 Cough, unspecified: Secondary | ICD-10-CM | POA: Diagnosis not present

## 2023-02-15 DIAGNOSIS — E1142 Type 2 diabetes mellitus with diabetic polyneuropathy: Secondary | ICD-10-CM

## 2023-02-15 DIAGNOSIS — Z885 Allergy status to narcotic agent status: Secondary | ICD-10-CM | POA: Diagnosis not present

## 2023-02-15 DIAGNOSIS — I517 Cardiomegaly: Secondary | ICD-10-CM | POA: Diagnosis not present

## 2023-02-15 LAB — COMPREHENSIVE METABOLIC PANEL
ALT: 33 U/L (ref 0–44)
AST: 41 U/L (ref 15–41)
Albumin: 3.8 g/dL (ref 3.5–5.0)
Alkaline Phosphatase: 133 U/L — ABNORMAL HIGH (ref 38–126)
Anion gap: 17 — ABNORMAL HIGH (ref 5–15)
BUN: 15 mg/dL (ref 8–23)
CO2: 22 mmol/L (ref 22–32)
Calcium: 9.7 mg/dL (ref 8.9–10.3)
Chloride: 95 mmol/L — ABNORMAL LOW (ref 98–111)
Creatinine, Ser: 0.83 mg/dL (ref 0.61–1.24)
GFR, Estimated: >60 mL/min (ref >60)
Glucose, Bld: 135 mg/dL — ABNORMAL HIGH (ref 70–99)
Potassium: 3.8 mmol/L (ref 3.5–5.1)
Sodium: 134 mmol/L — ABNORMAL LOW (ref 135–145)
Total Bilirubin: 0.2 mg/dL — ABNORMAL LOW (ref 0.3–1.2)
Total Protein: 7.9 g/dL (ref 6.5–8.1)

## 2023-02-15 LAB — CBC WITH DIFFERENTIAL/PLATELET
Abs Immature Granulocytes: 0.07 10*3/uL (ref 0.00–0.07)
Basophils Absolute: 0.1 10*3/uL (ref 0.0–0.1)
Basophils Relative: 1 %
Eosinophils Absolute: 0.2 10*3/uL (ref 0.0–0.5)
Eosinophils Relative: 2 %
HCT: 35.9 % — ABNORMAL LOW (ref 39.0–52.0)
Hemoglobin: 11.9 g/dL — ABNORMAL LOW (ref 13.0–17.0)
Immature Granulocytes: 1 %
Lymphocytes Relative: 18 %
Lymphs Abs: 1.7 10*3/uL (ref 0.7–4.0)
MCH: 35.1 pg — ABNORMAL HIGH (ref 26.0–34.0)
MCHC: 33.1 g/dL (ref 30.0–36.0)
MCV: 105.9 fL — ABNORMAL HIGH (ref 80.0–100.0)
Monocytes Absolute: 0.8 10*3/uL (ref 0.1–1.0)
Monocytes Relative: 8 %
Neutro Abs: 6.6 10*3/uL (ref 1.7–7.7)
Neutrophils Relative %: 70 %
Platelets: 209 10*3/uL (ref 150–400)
RBC: 3.39 MIL/uL — ABNORMAL LOW (ref 4.22–5.81)
RDW: 16.5 % — ABNORMAL HIGH (ref 11.5–15.5)
WBC: 9.4 10*3/uL (ref 4.0–10.5)
nRBC: 0 % (ref 0.0–0.2)

## 2023-02-15 LAB — RESP PANEL BY RT-PCR (RSV, FLU A&B, COVID)  RVPGX2
Influenza A by PCR: NEGATIVE
Influenza B by PCR: NEGATIVE
Resp Syncytial Virus by PCR: NEGATIVE
SARS Coronavirus 2 by RT PCR: NEGATIVE

## 2023-02-15 LAB — TROPONIN I (HIGH SENSITIVITY)
Troponin I (High Sensitivity): 33 ng/L — ABNORMAL HIGH (ref <18)
Troponin I (High Sensitivity): 31 ng/L — ABNORMAL HIGH (ref <18)

## 2023-02-15 LAB — BRAIN NATRIURETIC PEPTIDE: B Natriuretic Peptide: 136.7 pg/mL — ABNORMAL HIGH (ref 0.0–100.0)

## 2023-02-15 MED ORDER — IPRATROPIUM-ALBUTEROL 0.5-2.5 (3) MG/3ML IN SOLN
3.0000 mL | Freq: Four times a day (QID) | RESPIRATORY_TRACT | Status: DC
  Administered 2023-02-15 – 2023-02-17 (×5): 3 mL via RESPIRATORY_TRACT
  Filled 2023-02-15 (×6): qty 3

## 2023-02-15 MED ORDER — THIAMINE HCL 100 MG/ML IJ SOLN
100.0000 mg | Freq: Every day | INTRAMUSCULAR | Status: DC
  Filled 2023-02-15: qty 2

## 2023-02-15 MED ORDER — ALBUTEROL SULFATE (2.5 MG/3ML) 0.083% IN NEBU
2.5000 mg | INHALATION_SOLUTION | Freq: Four times a day (QID) | RESPIRATORY_TRACT | Status: DC
  Administered 2023-02-15 – 2023-02-16 (×3): 2.5 mg via RESPIRATORY_TRACT
  Filled 2023-02-15 (×3): qty 3

## 2023-02-15 MED ORDER — ADULT MULTIVITAMIN W/MINERALS CH
1.0000 | ORAL_TABLET | Freq: Every day | ORAL | Status: DC
  Administered 2023-02-15 – 2023-02-20 (×6): 1 via ORAL
  Filled 2023-02-15 (×7): qty 1

## 2023-02-15 MED ORDER — ONDANSETRON HCL 4 MG PO TABS: 4.0000 mg | ORAL_TABLET | Freq: Four times a day (QID) | ORAL | Status: DC | PRN

## 2023-02-15 MED ORDER — ACETAMINOPHEN 500 MG PO TABS
500.0000 mg | ORAL_TABLET | Freq: Three times a day (TID) | ORAL | Status: DC | PRN
  Administered 2023-02-16 – 2023-02-19 (×3): 500 mg via ORAL
  Filled 2023-02-15 (×3): qty 1

## 2023-02-15 MED ORDER — INSULIN ASPART 100 UNIT/ML IJ SOLN
0.0000 [IU] | Freq: Three times a day (TID) | INTRAMUSCULAR | Status: DC
  Administered 2023-02-16: 4 [IU] via SUBCUTANEOUS
  Administered 2023-02-16: 2 [IU] via SUBCUTANEOUS

## 2023-02-15 MED ORDER — LORAZEPAM 1 MG PO TABS
0.0000 mg | ORAL_TABLET | Freq: Four times a day (QID) | ORAL | Status: AC
  Administered 2023-02-16 – 2023-02-17 (×4): 1 mg via ORAL
  Filled 2023-02-15 (×2): qty 1
  Filled 2023-02-15: qty 2
  Filled 2023-02-15: qty 1

## 2023-02-15 MED ORDER — FUROSEMIDE 10 MG/ML IJ SOLN
20.0000 mg | Freq: Every day | INTRAMUSCULAR | Status: DC
  Administered 2023-02-15: 20 mg via INTRAVENOUS
  Filled 2023-02-15: qty 2

## 2023-02-15 MED ORDER — MELATONIN 3 MG PO TABS
3.0000 mg | ORAL_TABLET | Freq: Every evening | ORAL | Status: DC | PRN
  Administered 2023-02-16 – 2023-02-19 (×5): 3 mg via ORAL
  Filled 2023-02-15 (×5): qty 1

## 2023-02-15 MED ORDER — ENOXAPARIN SODIUM 60 MG/0.6ML IJ SOSY
50.0000 mg | PREFILLED_SYRINGE | INTRAMUSCULAR | Status: DC
  Administered 2023-02-16 – 2023-02-20 (×5): 50 mg via SUBCUTANEOUS
  Filled 2023-02-15 (×4): qty 0.6

## 2023-02-15 MED ORDER — THIAMINE MONONITRATE 100 MG PO TABS
100.0000 mg | ORAL_TABLET | Freq: Every day | ORAL | Status: DC
  Administered 2023-02-15 – 2023-02-20 (×6): 100 mg via ORAL
  Filled 2023-02-15 (×6): qty 1

## 2023-02-15 MED ORDER — METHYLPREDNISOLONE SODIUM SUCC 125 MG IJ SOLR
125.0000 mg | Freq: Once | INTRAMUSCULAR | Status: AC
  Administered 2023-02-15: 125 mg via INTRAVENOUS
  Filled 2023-02-15: qty 2

## 2023-02-15 MED ORDER — FUROSEMIDE 10 MG/ML IJ SOLN
40.0000 mg | Freq: Once | INTRAMUSCULAR | Status: AC
  Administered 2023-02-15: 40 mg via INTRAVENOUS
  Filled 2023-02-15: qty 4

## 2023-02-15 MED ORDER — FOLIC ACID 1 MG PO TABS
1.0000 mg | ORAL_TABLET | Freq: Every day | ORAL | Status: DC
  Administered 2023-02-16 – 2023-02-20 (×5): 1 mg via ORAL
  Filled 2023-02-15 (×5): qty 1

## 2023-02-15 MED ORDER — ACETAMINOPHEN 650 MG RE SUPP: 650.0000 mg | Freq: Four times a day (QID) | RECTAL | Status: DC | PRN

## 2023-02-15 MED ORDER — LORAZEPAM 1 MG PO TABS
0.0000 mg | ORAL_TABLET | Freq: Two times a day (BID) | ORAL | Status: AC
  Administered 2023-02-18: 1 mg via ORAL
  Filled 2023-02-15: qty 1

## 2023-02-15 MED ORDER — SORBITOL 70 % SOLN: 30.0000 mL | Freq: Every day | Status: DC | PRN

## 2023-02-15 MED ORDER — PREDNISONE 20 MG PO TABS: 50.0000 mg | ORAL_TABLET | Freq: Every day | ORAL | Status: DC

## 2023-02-15 MED ORDER — ONDANSETRON HCL 4 MG/2ML IJ SOLN: 4.0000 mg | Freq: Four times a day (QID) | INTRAMUSCULAR | Status: DC | PRN

## 2023-02-15 NOTE — ED Provider Notes (Signed)
[facility] AT [facility] Provider Note   CSN: [id number] Arrival date & time: 02/15/23  1722     History  Chief Complaint  Patient presents with   Shortness of Breath    Luis [name] is a 62 y.o. male history of heart failure here presenting with shortness of breath and leg swelling.  Patient states that he was recently admitted for pneumonia and heart failure.  Patient has been compliant with his Demadex.  Patient states that despite that he had 9 pound weight gain and worsening shortness of breath with exertion for the last week or so.  The history is provided by the patient.       Home Medications Prior to Admission medications   Medication Sig Start Date End Date Taking? Authorizing Provider  amLODipine (NORVASC) 10 MG tablet Take 1 tablet (10 mg total) by mouth daily. 02/10/23   [name], NP  ascorbic acid (VITAMIN C) 500 MG tablet Take 1 tablet (500 mg total) by mouth daily. 01/02/23 03/03/23  Berton Mount I, MD  atorvastatin (LIPITOR) 10 MG tablet Take 10 mg by mouth daily. 11/20/20   [provider]  dapagliflozin propanediol (FARXIGA) 10 MG TABS tablet Take 1 tablet (10 mg total) by mouth daily before breakfast. Patient not taking: Reported on 02/06/2023 01/20/23   [name], NP  docusate sodium (COLACE) 100 MG capsule Take 1 capsule (100 mg total) by mouth 2 (two) times daily. Patient not taking: Reported on 02/06/2023 01/01/23   Berton Mount I, MD  famotidine (PEPCID) 20 MG tablet Take 1 tablet (20 mg total) by mouth 2 (two) times daily. Patient taking differently: Take 20 mg by mouth daily. 01/01/23 02/06/23  Barnetta Chapel, MD  feeding supplement (ENSURE ENLIVE / ENSURE PLUS) LIQD Take 237 mLs by mouth 3 (three) times daily between meals. 01/01/23   Barnetta Chapel, MD  fluticasone (FLOVENT HFA) 110 MCG/ACT inhaler Inhale 2 puffs into the lungs 2 (two) times daily. 01/01/23 01/01/24  Barnetta Chapel, MD  folic  acid (FOLVITE) 1 MG tablet TAKE 1 TABLET(1 MG) BY MOUTH DAILY 12/26/22   [name], MD  ipratropium-albuterol (DUONEB) 0.5-2.5 (3) MG/3ML SOLN Take 3 mLs by nebulization every 6 (six) hours as needed. 02/10/23   [name], NP  metFORMIN (GLUCOPHAGE) 500 MG tablet Take 2 tablets by mouth with breakfast and 1 tablet by mouth at dinner 01/20/23   [name], NP  metoprolol succinate (TOPROL-XL) 50 MG 24 hr tablet Take 1 tablet (50 mg total) by mouth daily. Take with or immediately following a meal. 02/09/23   [name], NP  Multiple Vitamin (MULTIVITAMIN WITH MINERALS) TABS tablet Take 1 tablet by mouth every other day.     [provider]  nicotine (NICODERM CQ - DOSED IN MG/24 HOURS) 21 mg/24hr patch Place 1 patch (21 mg total) onto the skin daily. Patient not taking: Reported on 02/06/2023 01/02/23   Berton Mount I, MD  polyethylene glycol (MIRALAX / GLYCOLAX) 17 g packet Take 17 g by mouth daily. Patient not taking: Reported on 02/06/2023 01/02/23   Berton Mount I, MD  thiamine (VITAMIN B1) 100 MG tablet Take 1 tablet (100 mg total) by mouth daily. 11/10/22   [name], NP  torsemide (DEMADEX) 20 MG tablet Take 1 tablet (20 mg total) by mouth daily. 01/01/23 02/06/23  Barnetta Chapel, MD      Allergies    Lisinopril and Codeine  Review of Systems   Review of Systems  Respiratory:  Positive for shortness of breath.   All other systems reviewed and are negative.   Physical Exam Updated Vital Signs BP (!) 149/94   Pulse (!) 102   Resp 18   Ht 6\' 1"  (1.854 m)   Wt 105 kg   SpO2 92%   BMI 30.54 kg/m  Physical Exam Vitals and nursing note reviewed.  Constitutional:      Comments: Chronically ill   HENT:     Head: Normocephalic.     Mouth/Throat:     Mouth: Mucous membranes are moist.  Eyes:     Extraocular Movements: Extraocular movements intact.     Pupils: Pupils are equal, round, and reactive to light.  Cardiovascular:     Rate and  Rhythm: Normal rate and regular rhythm.  Pulmonary:     Comments: Crackles bilateral bases Musculoskeletal:     Cervical back: Normal range of motion and neck supple.     Comments: 2+ edema bilaterally  Skin:    Capillary Refill: Capillary refill takes less than 2 seconds.  Neurological:     General: No focal deficit present.     Mental Status: He is oriented to person, place, and time.  Psychiatric:        Mood and Affect: Mood normal.        Behavior: Behavior normal.     ED Results / Procedures / Treatments   Labs (all labs ordered are listed, but only abnormal results are displayed) Labs Reviewed  CBC WITH DIFFERENTIAL/PLATELET - Abnormal; Notable for the following components:      Result Value   RBC 3.39 (*)    Hemoglobin 11.9 (*)    HCT 35.9 (*)    MCV 105.9 (*)    MCH 35.1 (*)    RDW 16.5 (*)    All other components within normal limits  COMPREHENSIVE METABOLIC PANEL - Abnormal; Notable for the following components:   Sodium 134 (*)    Chloride 95 (*)    Glucose, Bld 135 (*)    Alkaline Phosphatase 133 (*)    Total Bilirubin 0.2 (*)    Anion gap 17 (*)    All other components within normal limits  BRAIN NATRIURETIC PEPTIDE - Abnormal; Notable for the following components:   B Natriuretic Peptide 136.7 (*)    All other components within normal limits  TROPONIN I (HIGH SENSITIVITY) - Abnormal; Notable for the following components:   Troponin I (High Sensitivity) 33 (*)    All other components within normal limits  TROPONIN I (HIGH SENSITIVITY) - Abnormal; Notable for the following components:   Troponin I (High Sensitivity) 31 (*)    All other components within normal limits  RESP PANEL BY RT-PCR (RSV, FLU A&B, COVID)  [id number]    EKG EKG Interpretation Date/Time:  Wednesday February 15 2023 17:33:46 EDT Ventricular Rate:  98 PR Interval:    QRS Duration:  172 QT Interval:  425 QTC Calculation: 543 R Axis:   -82  Text Interpretation: Atrial flutter with  predominant 3:1 AV block IVCD, consider atypical RBBB LVH with secondary repolarization abnormality Inferior infarct, acute (RCA) Anterior infarct, old Lateral leads are also involved Probable RV involvement, suggest recording right precordial leads Artifact in lead(s) I III aVR aVL aVF V1 V2 No significant change since last tracing Confirmed by Richardean Canal ([id number]) on 02/15/2023 5:35:28 PM  Radiology DG Chest Port 1 View  Result Date: 02/15/2023 CLINICAL  DATA:  Shortness of breath and cough EXAM: PORTABLE CHEST 1 VIEW COMPARISON:  Radiograph 12/28/2022 FINDINGS: Stable cardiomegaly. Aortic atherosclerotic calcification. Leadless pacer. Hazy airspace and interstitial opacities greatest in the lower lungs. Pulmonary vascular congestion. Small left-greater-than-right pleural effusions. No pneumothorax. IMPRESSION: Findings compatible with CHF similar to 12/28/2022. Electronically Signed   By: Minerva Fester M.D.   On: 02/15/2023 20:08    Procedures Procedures    CRITICAL CARE Performed by: Richardean Canal   Total critical care time: 34 minutes  Critical care time was exclusive of separately billable procedures and treating other patients.  Critical care was necessary to treat or prevent imminent or life-threatening deterioration.  Critical care was time spent personally by me on the following activities: development of treatment plan with patient and/or surrogate as well as nursing, discussions with consultants, evaluation of patient's response to treatment, examination of patient, obtaining history from patient or surrogate, ordering and performing treatments and interventions, ordering and review of laboratory studies, ordering and review of radiographic studies, pulse oximetry and re-evaluation of patient's condition.'  Medications Ordered in ED Medications  furosemide (LASIX) injection 40 mg (40 mg Intravenous Given 02/15/23 1935)    ED Course/ Medical Decision Making/ A&P                                  Medical Decision Making Luis [name] is a 62 y.o. male here presenting with shortness of breath and leg swelling.  Patient has history of CHF.  Patient has oxygen of 88% on room air.  Clinically patient appears volume overloaded.  Plan to get CBC and CMP and BNP and chest x-ray.  Patient will likely need admission for diuresis  8:27 PM I reviewed patient's labs and independently interpreted imaging studies.  Patient's BNP is only 130 but he is obese and I think it is likely false negative.  Patient's chest x-ray showed CHF.  I ordered 40 mg of IV Lasix.  Patient will be admitted for CHF exacerbation   Problems Addressed: Acute on chronic systolic congestive heart failure (HCC): acute illness or injury  Amount and/or Complexity of Data Reviewed Labs: ordered. Decision-making details documented in ED Course. Radiology: ordered and independent interpretation performed. Decision-making details documented in ED Course. ECG/medicine tests: ordered and independent interpretation performed. Decision-making details documented in ED Course.  Risk Prescription drug management. Decision regarding hospitalization.    Final Clinical Impression(s) / ED Diagnoses Final diagnoses:  None    Rx / DC Orders ED Discharge Orders     None         Charlynne Pander, MD 02/15/23 2029

## 2023-02-15 NOTE — ED Triage Notes (Signed)
Pt to the ed from home with a CC of increased sob with cough with Pna dx 2 weeks prior. Pt relays he has CHF and has a 9 lb weight gain in the last week. Pt denies fever, chills, dizziness, cp at this time.

## 2023-02-15 NOTE — ED Notes (Addendum)
ED TO INPATIENT HANDOFF REPORT  ED Nurse Name and Phone #: Eugenia Pancoast Name/Age/Gender Luis [name] 62 y.o. male Room/Bed: 042C/042C  Code Status   Code Status: Full Code  Home/SNF/Other Home Patient oriented to: self, place, time, and situation Is this baseline? Yes   Triage Complete: Triage complete  Chief Complaint Acute exacerbation of CHF (congestive heart failure) (HCC) [I50.9]  Triage Note Pt to the ed from home with a CC of increased sob with cough with Pna dx 2 weeks prior. Pt relays he has CHF and has a 9 lb weight gain in the last week. Pt denies fever, chills, dizziness, cp at this time.    Allergies Allergies  Allergen Reactions   Lisinopril     Other reaction(s): renal effects   Codeine Other (See Comments) and Hives    unknown Other reaction(s): Other (See Comments) unknown unknown    Level of Care/Admitting Diagnosis ED Disposition     ED Disposition  Admit   Condition  --   Comment  Hospital Area: [facility] [[id number]]  Level of Care: Telemetry Cardiac [[id number]]  May admit patient to [facility] or [facility] if equivalent level of care is available:: Yes  Covid Evaluation: Symptomatic Person Under Investigation (PUI) or recent exposure (last 10 days) *Testing Required*  Diagnosis: Acute exacerbation of CHF (congestive heart failure) [facility]) [[id number]]  Admitting Physician: Buena Irish [[id number]]  Attending Physician: Buena Irish [[id number]]  Certification:: I certify this patient will need inpatient services for at least 2 midnights  Expected Medical Readiness: 02/18/2023          B Medical/Surgery History Past Medical History:  Diagnosis Date   Acute respiratory failure with hypoxia (HCC) 12/23/2022   Alcohol withdrawal syndrome, with delirium (HCC) 12/27/2022   Diabetes mellitus without complication (HCC)    Diverticulitis    DVT (deep venous thrombosis) (HCC)    x3   Encephalopathy acute 12/28/2022   ETOH  abuse    H/O colectomy    Hyperlipidemia    Hypertension    Sepsis associated hypotension (HCC) 08/03/2019   Smoker    Past Surgical History:  Procedure Laterality Date   COLOSTOMY     PACEMAKER LEADLESS INSERTION N/A 12/31/2020   Procedure: PACEMAKER LEADLESS INSERTION;  Surgeon: [name], MD;  Location: [facility] INVASIVE CV LAB;  Service: Cardiovascular;  Laterality: N/A;   TONSILLECTOMY       A IV Location/Drains/Wounds Patient Lines/Drains/Airways Status     Active Line/Drains/Airways     Name Placement date Placement time Site Days   Peripheral IV 02/15/23 20 G Right Forearm 02/15/23  2231  Forearm   less than 1   Colostomy RLQ --  --  RLQ  --   Wound / Incision (Open or Dehisced) 12/25/22 Non-pressure wound;Other (Comment) Abdomen Lower;Medial Pt states he has had wound "for 4 years" 12/25/22  0700  Abdomen  52            Intake/Output Last 24 hours  Intake/Output Summary (Last 24 hours) at 02/15/2023 2237 Last data filed at 02/15/2023 2118 Gross per 24 hour  Intake --  Output 1000 ml  Net -1000 ml    Labs/Imaging Results for orders placed or performed during the hospital encounter of 02/15/23 (from the past 48 hour(s))  CBC with Differential     Status: Abnormal   Collection Time: 02/15/23  5:45 PM  Result Value Ref Range   WBC 9.4 4.0 - 10.5 K/uL   RBC  3.39 (L) 4.22 - 5.81 MIL/uL   Hemoglobin 11.9 (L) 13.0 - 17.0 g/dL   HCT 40.9 (L) 81.1 - 91.4 %   MCV 105.9 (H) 80.0 - 100.0 fL   MCH 35.1 (H) 26.0 - 34.0 pg   MCHC 33.1 30.0 - 36.0 g/dL   RDW 78.2 (H) 95.6 - 21.3 %   Platelets 209 150 - 400 K/uL   nRBC 0.0 0.0 - 0.2 %   Neutrophils Relative % 70 %   Neutro Abs 6.6 1.7 - 7.7 K/uL   Lymphocytes Relative 18 %   Lymphs Abs 1.7 0.7 - 4.0 K/uL   Monocytes Relative 8 %   Monocytes Absolute 0.8 0.1 - 1.0 K/uL   Eosinophils Relative 2 %   Eosinophils Absolute 0.2 0.0 - 0.5 K/uL   Basophils Relative 1 %   Basophils Absolute 0.1 0.0 - 0.1 K/uL   Immature  Granulocytes 1 %   Abs Immature Granulocytes 0.07 0.00 - 0.07 K/uL    Comment: Performed at [facility], [address], [place], [place] [address]  Comprehensive metabolic panel     Status: Abnormal   Collection Time: 02/15/23  5:45 PM  Result Value Ref Range   Sodium 134 (L) 135 - 145 mmol/L   Potassium 3.8 3.5 - 5.1 mmol/L   Chloride 95 (L) 98 - 111 mmol/L   CO2 22 22 - 32 mmol/L   Glucose, Bld 135 (H) 70 - 99 mg/dL    Comment: Glucose reference range applies only to samples taken after fasting for at least 8 hours.   BUN 15 8 - 23 mg/dL   Creatinine, Ser 8.46 0.61 - 1.24 mg/dL   Calcium 9.7 8.9 - 96.2 mg/dL   Total Protein 7.9 6.5 - 8.1 g/dL   Albumin 3.8 3.5 - 5.0 g/dL   AST 41 15 - 41 U/L   ALT 33 0 - 44 U/L   Alkaline Phosphatase 133 (H) 38 - 126 U/L   Total Bilirubin 0.2 (L) 0.3 - 1.2 mg/dL   GFR, Estimated >95 >28 mL/min    Comment: (NOTE) Calculated using the CKD-EPI Creatinine Equation (2021)    Anion gap 17 (H) 5 - 15    Comment: Performed at [facility], [address], [place], [place] [address]  Troponin I (High Sensitivity)     Status: Abnormal   Collection Time: 02/15/23  5:45 PM  Result Value Ref Range   Troponin I (High Sensitivity) 33 (H) <18 ng/L    Comment: (NOTE) Elevated high sensitivity troponin I (hsTnI) values and significant  changes across serial measurements may suggest ACS but many other  chronic and acute conditions are known to elevate hsTnI results.  Refer to the "Links" section for chest pain algorithms and additional  guidance. Performed at [facility], [address], [place], [place] [address]   Brain natriuretic peptide     Status: Abnormal   Collection Time: 02/15/23  5:45 PM  Result Value Ref Range   B Natriuretic Peptide 136.7 (H) 0.0 - 100.0 pg/mL    Comment: Performed at [facility], [address], [place], [place] [address]  Resp panel by RT-PCR (RSV, Flu A&B, Covid) Anterior Nasal Swab     Status:  None   Collection Time: 02/15/23  5:52 PM   Specimen: Anterior Nasal Swab  Result Value Ref Range   SARS Coronavirus 2 by RT PCR NEGATIVE NEGATIVE   Influenza A by PCR NEGATIVE NEGATIVE  Influenza B by PCR NEGATIVE NEGATIVE    Comment: (NOTE) The Xpert Xpress SARS-CoV-2/FLU/RSV plus assay is intended as an aid in the diagnosis of influenza from Nasopharyngeal swab specimens and should not be used as a sole basis for treatment. Nasal washings and aspirates are unacceptable for Xpert Xpress SARS-CoV-2/FLU/RSV testing.  Fact Sheet for Patients: BloggerCourse.com  Fact Sheet for Healthcare Providers: SeriousBroker.it  This test is not yet approved or cleared by the [place] FDA and has been authorized for detection and/or diagnosis of SARS-CoV-2 by FDA under an Emergency Use Authorization (EUA). This EUA will remain in effect (meaning this test can be used) for the duration of the COVID-19 declaration under Section 564(b)(1) of the Act, 21 U.S.C. section 360bbb-3(b)(1), unless the authorization is terminated or revoked.     Resp Syncytial Virus by PCR NEGATIVE NEGATIVE    Comment: (NOTE) Fact Sheet for Patients: BloggerCourse.com  Fact Sheet for Healthcare Providers: SeriousBroker.it  This test is not yet approved or cleared by the [place] FDA and has been authorized for detection and/or diagnosis of SARS-CoV-2 by FDA under an Emergency Use Authorization (EUA). This EUA will remain in effect (meaning this test can be used) for the duration of the COVID-19 declaration under Section 564(b)(1) of the Act, 21 U.S.C. section 360bbb-3(b)(1), unless the authorization is terminated or revoked.  Performed at [facility], [address], [place], [place] [address]   Troponin I (High Sensitivity)     Status: Abnormal   Collection Time: 02/15/23  7:36 PM  Result Value  Ref Range   Troponin I (High Sensitivity) 31 (H) <18 ng/L    Comment: (NOTE) Elevated high sensitivity troponin I (hsTnI) values and significant  changes across serial measurements may suggest ACS but many other  chronic and acute conditions are known to elevate hsTnI results.  Refer to the "Links" section for chest pain algorithms and additional  guidance. Performed at [facility], [address], [place], [place] [address]    DG Chest Port 1 View  Result Date: 02/15/2023 CLINICAL DATA:  Shortness of breath and cough EXAM: PORTABLE CHEST 1 VIEW COMPARISON:  Radiograph 12/28/2022 FINDINGS: Stable cardiomegaly. Aortic atherosclerotic calcification. Leadless pacer. Hazy airspace and interstitial opacities greatest in the lower lungs. Pulmonary vascular congestion. Small left-greater-than-right pleural effusions. No pneumothorax. IMPRESSION: Findings compatible with CHF similar to 12/28/2022. Electronically Signed   By: Minerva Fester M.D.   On: 02/15/2023 20:08    Pending Labs Unresulted Labs (From admission, onward)     Start     Ordered   02/16/23 0500  Magnesium  Tomorrow morning,   R        02/15/23 2109   02/16/23 0500  Basic metabolic panel  Tomorrow morning,   R        02/15/23 2109   02/16/23 0500  CBC  Tomorrow morning,   R        02/15/23 2109   02/16/23 0500  TSH  Tomorrow morning,   R        02/15/23 2109   02/16/23 0500  Hemoglobin A1c  Tomorrow morning,   R       Comments: To assess prior glycemic control    02/15/23 2136            Vitals/Pain Today's Vitals   02/15/23 2000 02/15/23 2015 02/15/23 2030 02/15/23 2120  BP: (!) 152/87 134/85 (!) 148/99 (!) 148/85  Pulse:    (!) 102  Resp:  15  SpO2:    95%  Weight:      Height:        Isolation Precautions No active isolations  Medications Medications  enoxaparin (LOVENOX) injection 50 mg (has no administration in time range)  acetaminophen (TYLENOL) tablet 500 mg (has no administration in  time range)    Or  acetaminophen (TYLENOL) suppository 650 mg (has no administration in time range)  ondansetron (ZOFRAN) tablet 4 mg (has no administration in time range)    Or  ondansetron (ZOFRAN) injection 4 mg (has no administration in time range)  sorbitol 70 % solution 30 mL (has no administration in time range)  melatonin tablet 3 mg (has no administration in time range)  albuterol (PROVENTIL) (2.5 MG/3ML) 0.083% nebulizer solution 2.5 mg (2.5 mg Nebulization Given 02/15/23 2210)  furosemide (LASIX) injection 20 mg (20 mg Intravenous Given 02/15/23 2210)  ipratropium-albuterol (DUONEB) 0.5-2.5 (3) MG/3ML nebulizer solution 3 mL (3 mLs Nebulization Given 02/15/23 2210)  predniSONE (DELTASONE) tablet 50 mg (has no administration in time range)  insulin aspart (novoLOG) injection 0-6 Units (has no administration in time range)  thiamine (VITAMIN B1) tablet 100 mg (100 mg Oral Given 02/15/23 2210)    Or  thiamine (VITAMIN B1) injection 100 mg ( Intravenous See Alternative 02/15/23 2210)  folic acid (FOLVITE) tablet 1 mg (has no administration in time range)  multivitamin with minerals tablet 1 tablet (1 tablet Oral Given 02/15/23 2211)  LORazepam (ATIVAN) tablet 0-4 mg (has no administration in time range)    Followed by  LORazepam (ATIVAN) tablet 0-4 mg (has no administration in time range)  furosemide (LASIX) injection 40 mg (40 mg Intravenous Given 02/15/23 1935)  methylPREDNISolone sodium succinate (SOLU-MEDROL) 125 mg/2 mL injection 125 mg (125 mg Intravenous Given 02/15/23 2209)    Mobility walks     Focused Assessments Just finished his duo neb   R Recommendations: See Admitting Provider Note  Report given to:   Additional Notes:  Patient needs to be able to stand at the side of the bed to use his urinal.

## 2023-02-15 NOTE — H&P (Signed)
History and Physical    Patient: Luis [name] [id number]:[id number] DOB: [birth year] DOA: 02/15/2023 DOS: the patient was seen and examined on 02/15/2023 PCP: [name], NP  Patient coming from: Home  Chief Complaint:  Chief Complaint  Patient presents with   Shortness of Breath   HPI: Luis [name] is a 62 y.o. male with medical history significant for diastolic dysfunction with a normal ejection fraction, type 2 diabetes mellitus, history of pacemaker, alcohol abuse, ostomy and chronic abdominal wound who presents with severe cough and shortness of breath which is has worsened over the course of the last 2 to 3 weeks.  The patient he cannot breathe when he leans back.  He has gained at least 8 pounds in the last 3 weeks.  He has a severe cough and he cannot catch his breath at all.  He picked up a new nebulizer machine but that has not helped.  He denies any fevers or chills or chest pain.  The patient was hospitalized 2 months ago for very similar symptoms but at that time he had progressive hypoxia and ended up intubated.  Patient says he has no recollection of his time in the ICU.  His initial diagnosis was congestive heart failure but his discharge diagnosis was pneumonia. Despite the patient's severe shortness of breath he resisted coming back to the hospital because he was so ill last time. In the emergency department the patient wa found to have O2 sats of 88% on room air.  His chest x-ray was consistent with congestive heart failure so the patient was treated with IV Lasix and we are asked to admit. The patient says he is compliant with his home torsemide dose daily.   Review of Systems: As mentioned in the history of present illness. All other systems reviewed and are negative. Past Medical History:  Diagnosis Date   Acute respiratory failure with hypoxia (HCC) 12/23/2022   Alcohol withdrawal syndrome, with delirium (HCC) 12/27/2022   Diabetes mellitus without complication (HCC)     Diverticulitis    DVT (deep venous thrombosis) (HCC)    x3   Encephalopathy acute 12/28/2022   ETOH abuse    H/O colectomy    Hyperlipidemia    Hypertension    Sepsis associated hypotension (HCC) 08/03/2019   Smoker    Past Surgical History:  Procedure Laterality Date   COLOSTOMY     PACEMAKER LEADLESS INSERTION N/A 12/31/2020   Procedure: PACEMAKER LEADLESS INSERTION;  Surgeon: [name], MD;  Location: [facility];  Service: Cardiovascular;  Laterality: N/A;   TONSILLECTOMY     Social History:  reports that he has been smoking cigarettes. He has a 44 pack-year smoking history. He uses smokeless tobacco. He reports current alcohol use. He reports current drug use. Drug: Marijuana.  Allergies  Allergen Reactions   Lisinopril     Other reaction(s): renal effects   Codeine Other (See Comments) and Hives    unknown Other reaction(s): Other (See Comments) unknown unknown    Family History  Problem Relation Age of Onset   COPD Mother    Prostate cancer Father    Colon cancer Neg Hx    Rectal cancer Neg Hx    Stomach cancer Neg Hx    Esophageal cancer Neg Hx     Prior to Admission medications   Medication Sig Start Date End Date Taking? Authorizing Provider  cetirizine (ZYRTEC) 10 MG tablet Take 10 mg by mouth daily. 02/13/23  Yes [provider]  amLODipine (NORVASC) 10 MG tablet Take 1 tablet (10 mg total) by mouth daily. 02/10/23   [name], NP  ascorbic acid (VITAMIN C) 500 MG tablet Take 1 tablet (500 mg total) by mouth daily. 01/02/23 03/03/23  Berton Mount I, MD  atorvastatin (LIPITOR) 10 MG tablet Take 10 mg by mouth daily. 11/20/20   [provider]  dapagliflozin propanediol (FARXIGA) 10 MG TABS tablet Take 1 tablet (10 mg total) by mouth daily before breakfast. Patient not taking: Reported on 02/06/2023 01/20/23   [name], NP  docusate sodium (COLACE) 100 MG capsule Take 1 capsule (100 mg total) by mouth 2 (two) times  daily. Patient not taking: Reported on 02/06/2023 01/01/23   Berton Mount I, MD  famotidine (PEPCID) 20 MG tablet Take 1 tablet (20 mg total) by mouth 2 (two) times daily. Patient taking differently: Take 20 mg by mouth daily. 01/01/23 02/06/23  Barnetta Chapel, MD  feeding supplement (ENSURE ENLIVE / ENSURE PLUS) LIQD Take 237 mLs by mouth 3 (three) times daily between meals. 01/01/23   Barnetta Chapel, MD  fluticasone (FLOVENT HFA) 110 MCG/ACT inhaler Inhale 2 puffs into the lungs 2 (two) times daily. 01/01/23 01/01/24  Barnetta Chapel, MD  folic acid (FOLVITE) 1 MG tablet TAKE 1 TABLET(1 MG) BY MOUTH DAILY 12/26/22   [name], MD  ipratropium-albuterol (DUONEB) 0.5-2.5 (3) MG/3ML SOLN Take 3 mLs by nebulization every 6 (six) hours as needed. 02/10/23   [name], NP  metFORMIN (GLUCOPHAGE) 500 MG tablet Take 2 tablets by mouth with breakfast and 1 tablet by mouth at dinner 01/20/23   [name], NP  metoprolol succinate (TOPROL-XL) 50 MG 24 hr tablet Take 1 tablet (50 mg total) by mouth daily. Take with or immediately following a meal. 02/09/23   [name], NP  Multiple Vitamin (MULTIVITAMIN WITH MINERALS) TABS tablet Take 1 tablet by mouth every other day.     [provider]  nicotine (NICODERM CQ - DOSED IN MG/24 HOURS) 21 mg/24hr patch Place 1 patch (21 mg total) onto the skin daily. Patient not taking: Reported on 02/06/2023 01/02/23   Berton Mount I, MD  polyethylene glycol (MIRALAX / GLYCOLAX) 17 g packet Take 17 g by mouth daily. Patient not taking: Reported on 02/06/2023 01/02/23   Berton Mount I, MD  thiamine (VITAMIN B1) 100 MG tablet Take 1 tablet (100 mg total) by mouth daily. 11/10/22   [name], NP  torsemide (DEMADEX) 20 MG tablet Take 1 tablet (20 mg total) by mouth daily. 01/01/23 02/06/23  Barnetta Chapel, MD    Physical Exam: Vitals:   02/15/23 1945 02/15/23 2000 02/15/23 2015 02/15/23 2030  BP: (!) 149/94 (!) 152/87 134/85  (!) 148/99  Pulse:      Resp:      SpO2:      Weight:      Height:       Physical Exam:  General: Visibly SOB, well developed, well nourished HEENT: Normocephalic, atraumatic, PERRL, soft, masses bilateral lateral lower neck area, almost fees like lipoma  Cardiovascular: Normal rate and rhythm. Distal pulses intact. Pulmonary: tachypneic, diminshed bs,No rales or wheezing Gastrointestinal: obese, distended, soft, non-tender, normoactive bowel sounds, ostomy, Reddish, flaky skin mostly covered in bandage Musculoskeletal:Normal ROM, 1+ lower ext edema Lymphadenopathy: No cervical LAD. Skin: Skin is warm and dry. Neuro: No focal deficits noted, AAOx3. PSYCH: Attentive and cooperative  Data Reviewed:  Results for orders placed or performed  during the hospital encounter of 02/15/23 (from the past 24 hour(s))  CBC with Differential     Status: Abnormal   Collection Time: 02/15/23  5:45 PM  Result Value Ref Range   WBC 9.4 4.0 - 10.5 K/uL   RBC 3.39 (L) 4.22 - 5.81 MIL/uL   Hemoglobin 11.9 (L) 13.0 - 17.0 g/dL   HCT 81.1 (L) 91.4 - 78.2 %   MCV 105.9 (H) 80.0 - 100.0 fL   MCH 35.1 (H) 26.0 - 34.0 pg   MCHC 33.1 30.0 - 36.0 g/dL   RDW 95.6 (H) 21.3 - 08.6 %   Platelets 209 150 - 400 K/uL   nRBC 0.0 0.0 - 0.2 %   Neutrophils Relative % 70 %   Neutro Abs 6.6 1.7 - 7.7 K/uL   Lymphocytes Relative 18 %   Lymphs Abs 1.7 0.7 - 4.0 K/uL   Monocytes Relative 8 %   Monocytes Absolute 0.8 0.1 - 1.0 K/uL   Eosinophils Relative 2 %   Eosinophils Absolute 0.2 0.0 - 0.5 K/uL   Basophils Relative 1 %   Basophils Absolute 0.1 0.0 - 0.1 K/uL   Immature Granulocytes 1 %   Abs Immature Granulocytes 0.07 0.00 - 0.07 K/uL  Comprehensive metabolic panel     Status: Abnormal   Collection Time: 02/15/23  5:45 PM  Result Value Ref Range   Sodium 134 (L) 135 - 145 mmol/L   Potassium 3.8 3.5 - 5.1 mmol/L   Chloride 95 (L) 98 - 111 mmol/L   CO2 22 22 - 32 mmol/L   Glucose, Bld 135 (H) 70 - 99  mg/dL   BUN 15 8 - 23 mg/dL   Creatinine, Ser 5.78 0.61 - 1.24 mg/dL   Calcium 9.7 8.9 - 46.9 mg/dL   Total Protein 7.9 6.5 - 8.1 g/dL   Albumin 3.8 3.5 - 5.0 g/dL   AST 41 15 - 41 U/L   ALT 33 0 - 44 U/L   Alkaline Phosphatase 133 (H) 38 - 126 U/L   Total Bilirubin 0.2 (L) 0.3 - 1.2 mg/dL   GFR, Estimated >62 >95 mL/min   Anion gap 17 (H) 5 - 15  Troponin I (High Sensitivity)     Status: Abnormal   Collection Time: 02/15/23  5:45 PM  Result Value Ref Range   Troponin I (High Sensitivity) 33 (H) <18 ng/L  Brain natriuretic peptide     Status: Abnormal   Collection Time: 02/15/23  5:45 PM  Result Value Ref Range   B Natriuretic Peptide 136.7 (H) 0.0 - 100.0 pg/mL  Resp panel by RT-PCR (RSV, Flu A&B, Covid) Anterior Nasal Swab     Status: None   Collection Time: 02/15/23  5:52 PM   Specimen: Anterior Nasal Swab  Result Value Ref Range   SARS Coronavirus 2 by RT PCR NEGATIVE NEGATIVE   Influenza A by PCR NEGATIVE NEGATIVE   Influenza B by PCR NEGATIVE NEGATIVE   Resp Syncytial Virus by PCR NEGATIVE NEGATIVE  Troponin I (High Sensitivity)     Status: Abnormal   Collection Time: 02/15/23  7:36 PM  Result Value Ref Range   Troponin I (High Sensitivity) 31 (H) <18 ng/L     Assessment and Plan: Acute respiratory failure, requiring 2 L O2 nasal cannula /acute CHF exacerbation +/-COPD exacerbation He has known diastolic heart failure.  His BNP is not significantly elevated but his chest x-ray and symptoms are consistent with CHF.  He has been given Lasix.  I  am concerned because this is how he presented 2 months ago and then he deteriorated and ended up intubated.  He does not have an elevated white blood cell count or fever or any sign of infection and this has been going on for almost 3 weeks. - Continue diuresis - Nebs plus steroids - Monitor  2. DMT2 - Continue Metformin and Farxiga.  Add corrective insulin  3. H/o Etoh abuse - CIWA monitoring   Advance Care Planning:   Code  Status: Full Code the patient names his Sister Coralee North as his surrogate decision-maker and he wants to be full code.  Consults: none  Family Communication: none  Severity of Illness: The appropriate patient status for this patient is INPATIENT. Inpatient status is judged to be reasonable and necessary in order to provide the required intensity of service to ensure the patient's safety. The patient's presenting symptoms, physical exam findings, and initial radiographic and laboratory data in the context of their chronic comorbidities is felt to place them at high risk for further clinical deterioration. Furthermore, it is not anticipated that the patient will be medically stable for discharge from the hospital within 2 midnights of admission.   * I certify that at the point of admission it is my clinical judgment that the patient will require inpatient hospital care spanning beyond 2 midnights from the point of admission due to high intensity of service, high risk for further deterioration and high frequency of surveillance required.*  Author: Buena Irish, MD 02/15/2023 9:13 PM  For on call review [email or web address].

## 2023-02-15 NOTE — Telephone Encounter (Signed)
My chart message is send to pt about medication being send in to pharmacy on the 02/10/23

## 2023-02-16 DIAGNOSIS — I5033 Acute on chronic diastolic (congestive) heart failure: Secondary | ICD-10-CM | POA: Diagnosis not present

## 2023-02-16 DIAGNOSIS — J441 Chronic obstructive pulmonary disease with (acute) exacerbation: Secondary | ICD-10-CM | POA: Insufficient documentation

## 2023-02-16 LAB — MAGNESIUM: Magnesium: 1.7 mg/dL (ref 1.7–2.4)

## 2023-02-16 LAB — BASIC METABOLIC PANEL
Anion gap: 13 (ref 5–15)
BUN: 16 mg/dL (ref 8–23)
CO2: 25 mmol/L (ref 22–32)
Calcium: 9.3 mg/dL (ref 8.9–10.3)
Chloride: 95 mmol/L — ABNORMAL LOW (ref 98–111)
Creatinine, Ser: 1.03 mg/dL (ref 0.61–1.24)
GFR, Estimated: >60 mL/min (ref >60)
Glucose, Bld: 235 mg/dL — ABNORMAL HIGH (ref 70–99)
Potassium: 4 mmol/L (ref 3.5–5.1)
Sodium: 133 mmol/L — ABNORMAL LOW (ref 135–145)

## 2023-02-16 LAB — BLOOD GAS, ARTERIAL
Acid-Base Excess: 6.6 mmol/L — ABNORMAL HIGH (ref 0.0–2.0)
Bicarbonate: 31.9 mmol/L — ABNORMAL HIGH (ref 20.0–28.0)
Drawn by: 39899
O2 Saturation: 66.3 %
Patient temperature: 36.7
pCO2 arterial: 46 mmHg (ref 32–48)
pH, Arterial: 7.44 (ref 7.35–7.45)
pO2, Arterial: 38 mmHg — CL (ref 83–108)

## 2023-02-16 LAB — TSH: TSH: 1.632 u[IU]/mL (ref 0.350–4.500)

## 2023-02-16 LAB — CBC
HCT: 35.9 % — ABNORMAL LOW (ref 39.0–52.0)
Hemoglobin: 12 g/dL — ABNORMAL LOW (ref 13.0–17.0)
MCH: 35.3 pg — ABNORMAL HIGH (ref 26.0–34.0)
MCHC: 33.4 g/dL (ref 30.0–36.0)
MCV: 105.6 fL — ABNORMAL HIGH (ref 80.0–100.0)
Platelets: 192 10*3/uL (ref 150–400)
RBC: 3.4 MIL/uL — ABNORMAL LOW (ref 4.22–5.81)
RDW: 16.4 % — ABNORMAL HIGH (ref 11.5–15.5)
WBC: 11.1 10*3/uL — ABNORMAL HIGH (ref 4.0–10.5)
nRBC: 0 % (ref 0.0–0.2)

## 2023-02-16 LAB — MRSA NEXT GEN BY PCR, NASAL: MRSA by PCR Next Gen: NOT DETECTED

## 2023-02-16 LAB — GLUCOSE, CAPILLARY
Glucose-Capillary: 274 mg/dL — ABNORMAL HIGH (ref 70–99)
Glucose-Capillary: 231 mg/dL — ABNORMAL HIGH (ref 70–99)
Glucose-Capillary: 279 mg/dL — ABNORMAL HIGH (ref 70–99)
Glucose-Capillary: 307 mg/dL — ABNORMAL HIGH (ref 70–99)
Glucose-Capillary: 337 mg/dL — ABNORMAL HIGH (ref 70–99)

## 2023-02-16 LAB — HEMOGLOBIN A1C
Hgb A1c MFr Bld: 7 % — ABNORMAL HIGH (ref 4.8–5.6)
Mean Plasma Glucose: 154.2 mg/dL

## 2023-02-16 MED ORDER — LIVING WELL WITH DIABETES BOOK
Freq: Once | Status: AC
  Filled 2023-02-16: qty 1

## 2023-02-16 MED ORDER — FUROSEMIDE 10 MG/ML IJ SOLN
40.0000 mg | Freq: Every day | INTRAMUSCULAR | Status: DC
  Administered 2023-02-16 – 2023-02-17 (×2): 40 mg via INTRAVENOUS
  Filled 2023-02-16 (×2): qty 4

## 2023-02-16 MED ORDER — ATORVASTATIN CALCIUM 10 MG PO TABS
10.0000 mg | ORAL_TABLET | Freq: Every day | ORAL | Status: DC
  Administered 2023-02-16 – 2023-02-20 (×5): 10 mg via ORAL
  Filled 2023-02-16 (×5): qty 1

## 2023-02-16 MED ORDER — INSULIN ASPART 100 UNIT/ML IJ SOLN
0.0000 [IU] | Freq: Every day | INTRAMUSCULAR | Status: DC
  Administered 2023-02-16: 4 [IU] via SUBCUTANEOUS
  Administered 2023-02-17 – 2023-02-18 (×2): 5 [IU] via SUBCUTANEOUS

## 2023-02-16 MED ORDER — SALINE SPRAY 0.65 % NA SOLN
1.0000 | NASAL | Status: DC | PRN
  Administered 2023-02-16 (×2): 1 via NASAL
  Filled 2023-02-16: qty 44

## 2023-02-16 MED ORDER — NICOTINE 14 MG/24HR TD PT24
14.0000 mg | MEDICATED_PATCH | Freq: Every day | TRANSDERMAL | Status: DC
Start: 2023-02-16 — End: 2023-02-16

## 2023-02-16 MED ORDER — PHENOL 1.4 % MT LIQD
1.0000 | OROMUCOSAL | Status: DC | PRN
  Administered 2023-02-16 – 2023-02-18 (×2): 1 via OROMUCOSAL
  Filled 2023-02-16: qty 177

## 2023-02-16 MED ORDER — NICOTINE 21 MG/24HR TD PT24
21.0000 mg | MEDICATED_PATCH | Freq: Every day | TRANSDERMAL | Status: DC
  Administered 2023-02-16 – 2023-02-20 (×5): 21 mg via TRANSDERMAL
  Filled 2023-02-16 (×6): qty 1

## 2023-02-16 MED ORDER — LORATADINE 10 MG PO TABS
10.0000 mg | ORAL_TABLET | Freq: Every day | ORAL | Status: DC | PRN
  Administered 2023-02-16 – 2023-02-20 (×5): 10 mg via ORAL
  Filled 2023-02-16 (×5): qty 1

## 2023-02-16 MED ORDER — METHYLPREDNISOLONE SODIUM SUCC 40 MG IJ SOLR
40.0000 mg | Freq: Two times a day (BID) | INTRAMUSCULAR | Status: DC
  Administered 2023-02-16 – 2023-02-18 (×5): 40 mg via INTRAVENOUS
  Filled 2023-02-16 (×4): qty 1

## 2023-02-16 MED ORDER — INSULIN ASPART 100 UNIT/ML IJ SOLN
0.0000 [IU] | Freq: Three times a day (TID) | INTRAMUSCULAR | Status: DC
  Administered 2023-02-16 – 2023-02-17 (×2): 7 [IU] via SUBCUTANEOUS

## 2023-02-16 MED ORDER — GUAIFENESIN-DM 100-10 MG/5ML PO SYRP
5.0000 mL | ORAL_SOLUTION | ORAL | Status: DC | PRN
  Administered 2023-02-16 – 2023-02-20 (×10): 5 mL via ORAL
  Filled 2023-02-16 (×10): qty 5

## 2023-02-16 MED ORDER — DAPAGLIFLOZIN PROPANEDIOL 10 MG PO TABS
10.0000 mg | ORAL_TABLET | Freq: Every day | ORAL | Status: DC
  Administered 2023-02-17 – 2023-02-20 (×4): 10 mg via ORAL
  Filled 2023-02-16 (×4): qty 1

## 2023-02-16 MED ORDER — AZITHROMYCIN 250 MG PO TABS
500.0000 mg | ORAL_TABLET | Freq: Every day | ORAL | Status: AC
  Administered 2023-02-16 – 2023-02-20 (×5): 500 mg via ORAL
  Filled 2023-02-16 (×5): qty 2

## 2023-02-16 MED ORDER — MOMETASONE FURO-FORMOTEROL FUM 200-5 MCG/ACT IN AERO
2.0000 | INHALATION_SPRAY | Freq: Two times a day (BID) | RESPIRATORY_TRACT | Status: DC
  Administered 2023-02-16 – 2023-02-20 (×8): 2 via RESPIRATORY_TRACT
  Filled 2023-02-16: qty 8.8

## 2023-02-16 MED ORDER — AMLODIPINE BESYLATE 10 MG PO TABS
10.0000 mg | ORAL_TABLET | Freq: Every day | ORAL | Status: DC
  Administered 2023-02-16 – 2023-02-20 (×5): 10 mg via ORAL
  Filled 2023-02-16 (×5): qty 1

## 2023-02-16 MED ORDER — INSULIN ASPART 100 UNIT/ML IJ SOLN
4.0000 [IU] | Freq: Once | INTRAMUSCULAR | Status: AC
  Administered 2023-02-16: 4 [IU] via SUBCUTANEOUS

## 2023-02-16 NOTE — Progress Notes (Signed)
   02/16/23 1314  Vitals  Pulse Rate (!) 101  ECG Heart Rate (!) 102  MEWS COLOR  MEWS Score Color Green  Oxygen Therapy  SpO2 93 %  O2 Device Nasal Cannula  O2 Flow Rate (L/min) 4 L/min  MEWS Score  MEWS Temp 0  MEWS Systolic 0  MEWS Pulse 1  MEWS RR 0  MEWS LOC 0  MEWS Score 1  Provider Notification  Provider Name/Title Dr Alvino Chapel  Date Provider Notified 02/16/23  Time Provider Notified 1314  Method of Notification  (secure chat)  Notification Reason Critical Result  Test performed and critical result ABGS  Date Critical Result Received 02/16/23  Time Critical Result Received 1300  Provider response See new orders (increase oxygen goal sats >92)  Date of Provider Response 02/16/23  Time of Provider Response 1314

## 2023-02-16 NOTE — Inpatient Diabetes Management (Signed)
Inpatient Diabetes Program Recommendations  AACE/ADA: New Consensus Statement on Inpatient Glycemic Control (2015)  Target Ranges:  Prepandial:   less than 140 mg/dL      Peak postprandial:   less than 180 mg/dL (1-2 hours)      Critically ill patients:  140 - 180 mg/dL   Lab Results  Component Value Date   GLUCAP 307 (H) 02/16/2023   HGBA1C 7.0 (H) 02/16/2023    Latest Reference Range & Units 02/16/23 01:38 02/16/23 06:48 02/16/23 08:31 02/16/23 11:37  Glucose-Capillary 70 - 99 mg/dL 027 (H) 253 (H) 664 (H) 307 (H)  (H): Data is abnormally high  Diabetes history: DM2 Outpatient Diabetes medications: Farxiga 10 mg qd Current orders for Inpatient glycemic control: Novolog 0-6 units tid correction, Solumedrol 40 mg q 12 hrs. (Received Solumedrol 125 mg x 1 on 02/15/23)  Inpatient Diabetes Program Recommendations:   While on steroids, please consider: -Novolog 0-9 units tid, 0-5 units hs  Thank you, Darel Hong E. Gilverto Dileonardo, RN, MSN, CDE  Diabetes Coordinator Inpatient Glycemic Control Team Team Pager [phone] (8am-5pm) 02/16/2023 12:09 PM

## 2023-02-16 NOTE — Plan of Care (Signed)
  Problem: Fluid Volume: Goal: Ability to maintain a balanced intake and output will improve Outcome: Progressing   

## 2023-02-16 NOTE — Progress Notes (Signed)
PROGRESS NOTE    Luis [name]  VWU:[id number] DOB: 1960-06-19 DOA: 02/15/2023 PCP: [name], NP     Brief Narrative:  Luis [name] is a 62 y.o. male with medical history significant for diastolic dysfunction with a normal ejection fraction, type 2 diabetes mellitus, history of pacemaker, alcohol abuse, ostomy and chronic abdominal wound who presents with severe cough and shortness of breath which is has worsened over the course of the last 2 to 3 weeks.  The patient he cannot breathe when he leans back.  He has gained at least 8 pounds in the last 3 weeks.  He has a severe cough and he cannot catch his breath at all.  He picked up a new nebulizer machine but that has not helped.  He denies any fevers or chills or chest pain.  The patient was hospitalized 2 months ago for very similar symptoms but at that time he had progressive hypoxia and ended up intubated.  Patient says he has no recollection of his time in the ICU.  His initial diagnosis was congestive heart failure but his discharge diagnosis was pneumonia.   In the emergency department the patient wa found to have O2 sats of 88% on room air.  His chest x-ray was consistent with congestive heart failure so the patient was treated with IV Lasix.  New events last 24 hours / Subjective: Patient is arousable to voice, but remains a little bit lethargic this morning.  Remains on 4 L nasal cannula O2 with oxygen saturation ranging 86 to 90%.  He does not wear oxygen at baseline.  Urine output recorded 1000 mL yesterday  Assessment & Plan:   Principal Problem:   Acute exacerbation of CHF (congestive heart failure) (HCC) Active Problems:   Acute on chronic heart failure with preserved ejection fraction (HFpEF) (HCC)   Essential hypertension   Type 2 diabetes mellitus with hyperlipidemia (HCC)   Tobacco dependence   COPD with acute exacerbation (HCC)   Acute hypoxic respiratory failure -Secondary to combined CHF exacerbation, COPD  exacerbation -COVID, influenza negative, MRSA PCR negative -Chest x-ray showing hazy airspace interstitial opacities greater in the lower lungs, small left greater than right pleural effusion, compatible with CHF -Currently requiring 4 L nasal cannula O2 -ABG obtained, showing hypoxia.  Oxygen bumped up to maintain saturation >92%  Acute on chronic diastolic CHF exacerbation -Echocardiogram 12/24/2022 revealed EF 50 to 55%, grade 2 diastolic dysfunction -Patient takes Demadex at home -Continue Lasix IV  -Strict I's and O's, daily weight, fluid restriction  COPD exacerbation -Solu-Medrol, azithromycin, breathing treatments  Diabetes mellitus with hyperglycemia -A1c 7.0.  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  Followed by wound clinic as well as general surgery at [facility]   DVT prophylaxis: Lovenox  Code Status: Full code Family Communication: None Disposition Plan: Home Status is: Inpatient Remains inpatient appropriate because: IV Lasix, Solu-Medrol, oxygen use    Antimicrobials:  Anti-infectives (From admission, onward)    None        Objective: Vitals:   02/16/23 0827 02/16/23 1314 02/16/23 1338 02/16/23 1354  BP: 137/83   (!) 153/86  Pulse: (!) 101 (!) 101 (!) 101 (!) 101  Resp: 17  Temp: 98 F (36.7 C)     TempSrc: Oral     SpO2: 91% 93% 99% 95%  Weight:      Height:        Intake/Output Summary (Last 24 hours) at 02/16/2023 1454 Last data filed at 02/16/2023 1300 Gross per 24 hour  Intake 240 ml  Output 2050 ml  Net -1810 ml   Filed Weights   02/15/23 1727 02/16/23 0140   Weight: 105 kg 108.6 kg    Examination:  General exam: Appears calm and comfortable, sleepy but arousable and appropriate Respiratory system: Clear to auscultation. Respiratory effort normal.  Without distress or accessory muscle use Cardiovascular system: S1 & S2 heard, RRR. No murmurs. No pedal edema. Gastrointestinal system: Abdomen is nondistended, abdominal wound dressing in place.  Has ileostomy bag Central nervous system: Alert and oriented.  Extremities: Symmetric in appearance  Psychiatry: Judgement and insight appear normal. Mood & affect appropriate.   Data Reviewed: I have personally reviewed following labs and imaging studies  CBC: Recent Labs  Lab 02/15/23 1745 02/16/23 0423  WBC 9.4 11.1*  NEUTROABS 6.6  --   HGB 11.9* 12.0*  HCT 35.9* 35.9*  MCV 105.9* 105.6*  PLT 209 192   Basic Metabolic Panel: Recent Labs  Lab 02/15/23 1745 02/16/23 0423  NA 134* 133*  K 3.8 4.0  CL 95* 95*  CO2 22 25  GLUCOSE 135* 235*  BUN 15 16  CREATININE 0.83 1.03  CALCIUM 9.7 9.3  MG  --  1.7   GFR: Estimated Creatinine Clearance: 94.9 mL/min (by C-G formula based on SCr of 1.03 mg/dL). Liver Function Tests: Recent Labs  Lab 02/15/23 1745  AST 41  ALT 33  ALKPHOS 133*  BILITOT 0.2*  PROT 7.9  ALBUMIN 3.8   No results for input(s): "LIPASE", "AMYLASE" in the last 168 hours. No results for input(s): "AMMONIA" in the last 168 hours. Coagulation Profile: No results for input(s): "INR", "PROTIME" in the last 168 hours. Cardiac Enzymes: No results for input(s): "CKTOTAL", "CKMB", "CKMBINDEX", "TROPONINI" in the last 168 hours. BNP (last 3 results) No results for input(s): "PROBNP" in the last 8760 hours. HbA1C: Recent Labs    02/16/23 0423  HGBA1C 7.0*   CBG: Recent Labs  Lab 02/16/23 0138 02/16/23 0648 02/16/23 0831 02/16/23 1137  GLUCAP 274* 231* 279* 307*   Lipid Profile: No results for input(s): "CHOL", "HDL", "LDLCALC", "TRIG", "CHOLHDL",  "LDLDIRECT" in the last 72 hours. Thyroid Function Tests: Recent Labs    02/16/23 0423  TSH 1.632   Anemia Panel: No results for input(s): "VITAMINB12", "FOLATE", "FERRITIN", "TIBC", "IRON", "RETICCTPCT" in the last 72 hours. Sepsis Labs: No results for input(s): "PROCALCITON", "LATICACIDVEN" in the last 168 hours.  Recent Results (from the past 240 hour(s))  Resp panel by RT-PCR (RSV, Flu A&B, Covid) Anterior Nasal Swab     Status: None   Collection Time: 02/15/23  5:52 PM   Specimen: Anterior Nasal Swab  Result Value Ref Range Status   SARS Coronavirus 2 by RT PCR NEGATIVE NEGATIVE Final   Influenza A by PCR NEGATIVE NEGATIVE Final   Influenza B by PCR NEGATIVE NEGATIVE Final    Comment: (NOTE) The Xpert Xpress SARS-CoV-2/FLU/RSV plus assay is intended as an aid in the diagnosis of influenza from Nasopharyngeal swab specimens and should not be used as a sole basis for treatment. Nasal washings and aspirates are unacceptable for Xpert Xpress SARS-CoV-2/FLU/RSV testing.  Fact Sheet for Patients: BloggerCourse.com  Fact Sheet for Healthcare  Providers: SeriousBroker.it  This test is not yet approved or cleared by the [place] and has been authorized for detection and/or diagnosis of SARS-CoV-2 by FDA under an Emergency Use Authorization (EUA). This EUA will remain in effect (meaning this test can be used) for the duration of the COVID-19 declaration under Section 564(b)(1) of the Act, 21 U.S.C. section 360bbb-3(b)(1), unless the authorization is terminated or revoked.     Resp Syncytial Virus by PCR NEGATIVE NEGATIVE Final    Comment: (NOTE) Fact Sheet for Patients: BloggerCourse.com  Fact Sheet for Healthcare Providers: SeriousBroker.it  This test is not yet approved or cleared by the [place] FDA and has been authorized for detection and/or diagnosis of  SARS-CoV-2 by FDA under an Emergency Use Authorization (EUA). This EUA will remain in effect (meaning this test can be used) for the duration of the COVID-19 declaration under Section 564(b)(1) of the Act, 21 U.S.C. section 360bbb-3(b)(1), unless the authorization is terminated or revoked.  Performed at [facility], [address], [place], [place] [address]   MRSA Next Gen by PCR, Nasal     Status: None   Collection Time: 02/16/23  2:05 AM   Specimen: Nasal Mucosa; Nasal Swab  Result Value Ref Range Status   MRSA by PCR Next Gen NOT DETECTED NOT DETECTED Final    Comment: (NOTE) The GeneXpert MRSA Assay (FDA approved for NASAL specimens only), is one component of a comprehensive MRSA colonization surveillance program. It is not intended to diagnose MRSA infection nor to guide or monitor treatment for MRSA infections. Test performance is not FDA approved in patients less than 40 years old. Performed at [facility], [address], [place], [place] [address]       Radiology Studies: DG Chest Port 1 View  Result Date: 02/15/2023 CLINICAL DATA:  Shortness of breath and cough EXAM: PORTABLE CHEST 1 VIEW COMPARISON:  Radiograph 12/28/2022 FINDINGS: Stable cardiomegaly. Aortic atherosclerotic calcification. Leadless pacer. Hazy airspace and interstitial opacities greatest in the lower lungs. Pulmonary vascular congestion. Small left-greater-than-right pleural effusions. No pneumothorax. IMPRESSION: Findings compatible with CHF similar to 12/28/2022. Electronically Signed   By: Minerva Fester M.D.   On: 02/15/2023 20:08      Scheduled Meds:  enoxaparin (LOVENOX) injection  50 mg Subcutaneous Q24H   folic acid  1 mg Oral Daily   furosemide  40 mg Intravenous Daily   insulin aspart  0-5 Units Subcutaneous QHS   insulin aspart  0-6 Units Subcutaneous TID WC   insulin aspart  0-9 Units Subcutaneous TID WC   ipratropium-albuterol  3 mL Nebulization Q6H   LORazepam  0-4 mg  Oral Q6H   Followed by   Melene Muller ON 02/18/2023] LORazepam  0-4 mg Oral Q12H   methylPREDNISolone (SOLU-MEDROL) injection  40 mg Intravenous Q12H   multivitamin with minerals  1 tablet Oral Daily   nicotine  21 mg Transdermal Daily   thiamine  100 mg Oral Daily   Or   thiamine  100 mg Intravenous Daily   Continuous Infusions:   LOS: 1 day   Time spent: 40 minutes   Noralee Stain, DO [facility] 02/16/2023, 2:54 PM   Available via Epic secure chat 7am-7pm After these hours, please refer to coverage provider listed on [email or web address]

## 2023-02-16 NOTE — TOC Initial Note (Signed)
Transition of Care [facility]) - Initial/Assessment Note    Patient Details  Name: Luis [name] MRN: [id number] Date of Birth: Jul 31, 1960  Transition of Care [facility]) CM/SW Contact:    Delilah Shan, LCSWA Phone Number: 02/16/2023, 4:36 PM  Clinical Narrative:                  CSW received consult for patient. Patient reports PTA he comes from home alone. CSW offered patient outpatient substance use treatment services resources. Patient accepted. All questions answered. No further questions reported at this time.       Patient Goals and CMS Choice            Expected Discharge Plan and Services                                              Prior Living Arrangements/Services                       Activities of Daily Living      Permission Sought/Granted                  Emotional Assessment              Admission diagnosis:  Acute exacerbation of CHF (congestive heart failure) (HCC) [I50.9] Acute on chronic systolic congestive heart failure (HCC) [I50.23] Patient Active Problem List   Diagnosis Date Noted   COPD with acute exacerbation (HCC) 02/16/2023   Acute exacerbation of CHF (congestive heart failure) (HCC) 02/15/2023   Type 2 diabetes mellitus with diabetic polyneuropathy, without long-term current use of insulin (HCC) 01/20/2023   Chronic combined systolic and diastolic heart failure (HCC) 01/20/2023   Acute on chronic heart failure with preserved ejection fraction (HFpEF) (HCC) 12/23/2022   Encounter for smoking cessation counseling 12/09/2022   Prostate cancer screening 12/09/2022   Alcoholism (HCC) 12/09/2022   Abdominal wall hernia at previous stoma site 12/02/2022   Nonhealing surgical wound, subsequent encounter 11/24/2022   Abdominal hernia without obstruction and without gangrene 11/22/2022   Class 1 obesity with serious comorbidity and body mass index (BMI) of 32.0 to 32.9 in adult 11/04/2022   Elevated hemoglobin (HCC)  11/04/2022   Smoker 11/04/2022   Irritant contact dermatitis associated with digestive stoma 11/02/2022   Ileostomy prolapse (HCC) 08/18/2022   Ileostomy in place [facility]) 08/08/2022   Bilateral inguinal hernia without obstruction or gangrene 08/08/2022   Surgical wound, non healing 04/26/2022   Ileostomy dysfunction (HCC) 04/12/2022   Non-recurrent bilateral inguinal hernia without obstruction or gangrene 03/22/2022   Incisional hernia, without obstruction or gangrene    Nonhealing nonsurgical wound    Ileostomy stenosis (HCC)    Irritant contact dermatitis associated with fecal stoma    Ileostomy care (HCC)    Pacemaker    AV block, 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   Chronic abdominal wound infection, subsequent encounter 12/26/2020   Alcohol dependence with uncomplicated withdrawal (HCC) 12/26/2020   Tobacco dependence 12/26/2020   Essential hypertension 12/26/2020   Intra-abdominal abscess (HCC) 08/03/2019   Type 2 diabetes mellitus with hyperlipidemia (HCC) 08/03/2019   History of DVT (deep vein thrombosis) 08/03/2019   PCP:  [name], NP Pharmacy:   [id number] - [place], [place] - [address] AT [facility] [address] [facility] [address] [place] [address]  Phone: [phone] Fax: [phone]     Social Determinants of Health (SDOH) Social History: SDOH Screenings   Food Insecurity: Patient Unable To Answer (12/23/2022)  Housing: High Risk (12/23/2022)  Transportation Needs: Patient Unable To Answer (12/23/2022)  Utilities: Patient Unable To Answer (12/23/2022)  Depression (PHQ2-9): High Risk (01/20/2023)  Tobacco Use: High Risk (01/02/2023)   SDOH Interventions:     Readmission Risk Interventions     No data to display

## 2023-02-17 ENCOUNTER — Ambulatory Visit (HOSPITAL_COMMUNITY): Payer: Medicaid Other | Admitting: Nurse Practitioner

## 2023-02-17 ENCOUNTER — Other Ambulatory Visit: Payer: Self-pay

## 2023-02-17 ENCOUNTER — Encounter (HOSPITAL_COMMUNITY): Payer: Self-pay | Admitting: Internal Medicine

## 2023-02-17 ENCOUNTER — Inpatient Hospital Stay (HOSPITAL_COMMUNITY): Payer: Medicaid Other

## 2023-02-17 DIAGNOSIS — Z7689 Persons encountering health services in other specified circumstances: Secondary | ICD-10-CM | POA: Diagnosis not present

## 2023-02-17 DIAGNOSIS — I517 Cardiomegaly: Secondary | ICD-10-CM | POA: Diagnosis not present

## 2023-02-17 DIAGNOSIS — I5033 Acute on chronic diastolic (congestive) heart failure: Secondary | ICD-10-CM | POA: Diagnosis not present

## 2023-02-17 DIAGNOSIS — R0603 Acute respiratory distress: Secondary | ICD-10-CM | POA: Diagnosis not present

## 2023-02-17 LAB — GLUCOSE, CAPILLARY
Glucose-Capillary: 244 mg/dL — ABNORMAL HIGH (ref 70–99)
Glucose-Capillary: 332 mg/dL — ABNORMAL HIGH (ref 70–99)
Glucose-Capillary: 368 mg/dL — ABNORMAL HIGH (ref 70–99)

## 2023-02-17 LAB — BASIC METABOLIC PANEL
Anion gap: 9 (ref 5–15)
BUN: 30 mg/dL — ABNORMAL HIGH (ref 8–23)
CO2: 27 mmol/L (ref 22–32)
Calcium: 8.9 mg/dL (ref 8.9–10.3)
Chloride: 97 mmol/L — ABNORMAL LOW (ref 98–111)
Creatinine, Ser: 0.99 mg/dL (ref 0.61–1.24)
GFR, Estimated: >60 mL/min (ref >60)
Glucose, Bld: 302 mg/dL — ABNORMAL HIGH (ref 70–99)
Potassium: 4.2 mmol/L (ref 3.5–5.1)
Sodium: 133 mmol/L — ABNORMAL LOW (ref 135–145)

## 2023-02-17 LAB — BLOOD GAS, VENOUS
Acid-Base Excess: 2.7 mmol/L — ABNORMAL HIGH (ref 0.0–2.0)
Bicarbonate: 30.8 mmol/L — ABNORMAL HIGH (ref 20.0–28.0)
O2 Saturation: 82.2 %
Patient temperature: 37.2
pCO2, Ven: 64 mmHg — ABNORMAL HIGH (ref 44–60)
pH, Ven: 7.29 (ref 7.25–7.43)
pO2, Ven: 53 mmHg — ABNORMAL HIGH (ref 32–45)

## 2023-02-17 LAB — CBC
HCT: 35.8 % — ABNORMAL LOW (ref 39.0–52.0)
Hemoglobin: 11.9 g/dL — ABNORMAL LOW (ref 13.0–17.0)
MCH: 36.5 pg — ABNORMAL HIGH (ref 26.0–34.0)
MCHC: 33.2 g/dL (ref 30.0–36.0)
MCV: 109.8 fL — ABNORMAL HIGH (ref 80.0–100.0)
Platelets: 194 10*3/uL (ref 150–400)
RBC: 3.26 MIL/uL — ABNORMAL LOW (ref 4.22–5.81)
RDW: 16.4 % — ABNORMAL HIGH (ref 11.5–15.5)
WBC: 13.8 10*3/uL — ABNORMAL HIGH (ref 4.0–10.5)
nRBC: 0 % (ref 0.0–0.2)

## 2023-02-17 MED ORDER — ALBUTEROL SULFATE (2.5 MG/3ML) 0.083% IN NEBU
INHALATION_SOLUTION | RESPIRATORY_TRACT | Status: AC
  Administered 2023-02-17: 2.5 mg
  Filled 2023-02-17: qty 3

## 2023-02-17 MED ORDER — IPRATROPIUM-ALBUTEROL 0.5-2.5 (3) MG/3ML IN SOLN
3.0000 mL | Freq: Four times a day (QID) | RESPIRATORY_TRACT | Status: DC | PRN
  Administered 2023-02-18 – 2023-02-19 (×3): 3 mL via RESPIRATORY_TRACT
  Filled 2023-02-17 (×3): qty 3

## 2023-02-17 MED ORDER — FUROSEMIDE 10 MG/ML IJ SOLN
40.0000 mg | Freq: Two times a day (BID) | INTRAMUSCULAR | Status: DC
  Administered 2023-02-17 – 2023-02-20 (×6): 40 mg via INTRAVENOUS
  Filled 2023-02-17 (×6): qty 4

## 2023-02-17 MED ORDER — FUROSEMIDE 10 MG/ML IJ SOLN
40.0000 mg | Freq: Once | INTRAMUSCULAR | Status: AC
  Administered 2023-02-17: 40 mg via INTRAVENOUS
  Filled 2023-02-17: qty 4

## 2023-02-17 MED ORDER — INSULIN ASPART 100 UNIT/ML IJ SOLN
0.0000 [IU] | Freq: Three times a day (TID) | INTRAMUSCULAR | Status: DC
  Administered 2023-02-17: 11 [IU] via SUBCUTANEOUS
  Administered 2023-02-17 – 2023-02-18 (×2): 5 [IU] via SUBCUTANEOUS
  Administered 2023-02-18 (×2): 8 [IU] via SUBCUTANEOUS
  Administered 2023-02-19: 11 [IU] via SUBCUTANEOUS
  Administered 2023-02-19: 3 [IU] via SUBCUTANEOUS
  Administered 2023-02-19: 5 [IU] via SUBCUTANEOUS
  Administered 2023-02-20: 2 [IU] via SUBCUTANEOUS

## 2023-02-17 NOTE — Telephone Encounter (Signed)
Retured pts call LVM  regarding nebulizer prescription

## 2023-02-17 NOTE — Plan of Care (Signed)

## 2023-02-17 NOTE — Consult Note (Signed)
WOC Nurse Consult Note: patient familiar to WOC team from previous admissions;  longstanding history of ileostomy and chronic wound to abdomen; is followed by ostomy clinic with last visit 01/31/2023 Reason for Consult: abdominal wound  Wound type: Full thickness abdomen  Pressure Injury POA: NA  Measurement: 15 cm x 15 cm total area with scattered areas of partial and full thickness skin loss, areas of full thickness skin loss mid and distal portions of wound with some dry exudate  Wound bed: erythematous, some pink and moist others with dried yellow exudate  Drainage (amount, consistency, odor) minimal serosanguinous  Periwound: peeling skin, has ileostomy to RLQ  Dressing procedure/placement/frequency: Cleanse abdominal wound with Vashe wound cleanser, apply silver hydrofiber [employer] [place] [phone]) to draining areas and cover with ABD pads and tape.  Perform every other day as patient will allow.   POC discussed with bedside nurse. WOC team will not follow at this time. Re-consult if further needs raise.   WOC Nurse ostomy consult note; patient with longstanding ileostomy RLQ followed in ostomy clinic  Stoma type/location: RLQ ileostomy  Stomal assessment/size: per last clinic note 1" below skin level  Peristomal assessment: unable to visualize, patient has pouch in place and defers to have removed at this time  Treatment options for stomal/peristomal skin:  Output brown effluent in current pouch  Ostomy pouching: 1 piece flexible convex [employer] [place] #[id number].  Education provided: none, patient well versed in care of ostomy  Enrolled patient in [employer] DC program: no, longstanding ostomy   Patient has some supplies from home and I did leave a 1 piece flexible convex, a barrier ring and some no sting barrier wipes in room if patient needs to change his pouch.    Patient to continue outpatient follow-up at clinic at discharge.    WOC team will not follow at this time. Re-consult if  further needs arise.   Thank you,    Priscella Mann MSN, RN-BC, [employer] [phone]

## 2023-02-17 NOTE — Inpatient Diabetes Management (Signed)
Inpatient Diabetes Program Recommendations  AACE/ADA: New Consensus Statement on Inpatient Glycemic Control (2015)  Target Ranges:  Prepandial:   less than 140 mg/dL      Peak postprandial:   less than 180 mg/dL (1-2 hours)      Critically ill patients:  140 - 180 mg/dL   Lab Results  Component Value Date   GLUCAP 337 (H) 02/16/2023   HGBA1C 7.0 (H) 02/16/2023    Diabetes history: DM2 Outpatient Diabetes medications: Farxiga 10 mg qd Current orders for Inpatient glycemic control: Novolog 0-9 units tid correction, Solumedrol 40 mg q 12 hrs. (Received Solumedrol 125 mg x 1 on 02/15/23)   Inpatient Diabetes Program Recommendations:   -Increase Novolog correction to 0-15 units tid, 0-5 units hs  Thank you, Luis [name]. Amando Chaput, RN, MSN, CDE  Diabetes Coordinator Inpatient Glycemic Control Team Team Pager [phone] (8am-5pm) 02/17/2023 11:05 AM

## 2023-02-17 NOTE — Consult Note (Addendum)
Cardiology Consultation   Patient ID: Luis [name] MRN: [id number]; DOB: March 05, 1961  Admit date: 02/15/2023 Date of Consult: 02/17/2023  PCP:  [name], NP   [facility] Providers Cardiologist:  None  Electrophysiologist:  [name], MD    Patient Profile:   Luis [name] is a 62 y.o. male with a hx of CHB s/p leadless PPM, HTN, HLD, DVT, tobacco use, ETOH, diverticulitis w/ perforation s/p colectomy/ileostomy complicated by wound infections who is being seen 02/17/2023 for the evaluation of CHF at the request of Dr. Alvino Chapel.  History of Present Illness:   Luis [name] is a 62 yo male with PMH noted above. He has been followed most recently by Dr. Lalla Brothers. He presented with CHB of unclear etiology back 12/2020, ultimately underwent Micra implant. Echo during that admission showed LVEF of 65-70%, no rWMA, normal RV, moderated dilated LA.   Last seen in the office on 05/2022 with Dr. Lalla Brothers.  Remote interrogations had shown stable device function.  At that visit he reported gaining up to 40 pounds since he was last seen in the office.  It was recommended that he have a repeat echocardiogram to ensure his LV function had remained stable with the need for RV pacing.  Echo 07/2022 showed LVEF of 55 to 60%, no regional wall motion abnormality, normal RV, mild MR.  He was admitted 7/12-21/2024 with acute hypoxic and hypercarbic respiratory failure in the setting of pneumonia with COPD, diastolic CHF, delirium abdominal wounds and dysphagia.  Did require a cortrack during that admission.  Echocardiogram with LVEF of 50 to 55%, no regional wall motion abnormality, grade 2 diastolic dysfunction, normal RV, mildly dilated left atrium, trivial MR.   Presented to the ED on 9/4 with complaints of severe cough and shortness of breath over the past 2 to 3 weeks.  Reported he had increased weight gain of 8 pounds as well. He currently lives at home, cares for himself. Eats lots of frozen  meals, lots of salt. Reports its hard and expensive to eat healthy. Has had decent UOP while at home prior to admission. Does weigh himself on a daily basis.  Labs on admission showed sodium 134 potassium 3.8, creatinine 0.83, BNP 136, high-sensitivity troponin 33>> 31, WBC 9.4, hemoglobin 11.9, TSH 1.632, hemoglobin A1c 7.  Chest x-ray with small left greater than right pleural effusions.  Admitted to internal medicine for further management.  Has been diuresed on several doses of IV Lasix 40 mg, requiring increased O2 demand today.  Cardiology now asked to evaluate.   Past Medical History:  Diagnosis Date   Acute respiratory failure with hypoxia (HCC) 12/23/2022   Alcohol withdrawal syndrome, with delirium (HCC) 12/27/2022   Diabetes mellitus without complication (HCC)    Diverticulitis    DVT (deep venous thrombosis) (HCC)    x3   Encephalopathy acute 12/28/2022   ETOH abuse    H/O colectomy    Hyperlipidemia    Hypertension    Sepsis associated hypotension (HCC) 08/03/2019   Smoker     Past Surgical History:  Procedure Laterality Date   COLOSTOMY     PACEMAKER LEADLESS INSERTION N/A 12/31/2020   Procedure: PACEMAKER LEADLESS INSERTION;  Surgeon: [name], MD;  Location: [facility] INVASIVE CV [facility];  Service: Cardiovascular;  Laterality: N/A;   TONSILLECTOMY       Home Medications:  Prior to Admission medications   Medication Sig Start Date End Date Taking? Authorizing Provider  acetaminophen (TYLENOL) 650 MG CR  tablet Take 1,300 mg by mouth every 8 (eight) hours as needed for pain.   Yes [provider]  amLODipine (NORVASC) 10 MG tablet Take 1 tablet (10 mg total) by mouth daily. 02/10/23  Yes [name], NP  ascorbic acid (VITAMIN C) 500 MG tablet Take 1 tablet (500 mg total) by mouth daily. 01/02/23 03/03/23 Yes Berton Mount I, MD  atorvastatin (LIPITOR) 10 MG tablet Take 10 mg by mouth daily. 11/20/20  Yes [provider]  cetirizine (ZYRTEC) 10 MG  tablet Take 10 mg by mouth daily. 02/13/23  Yes [provider]  dapagliflozin propanediol (FARXIGA) 10 MG TABS tablet Take 1 tablet (10 mg total) by mouth daily before breakfast. 01/20/23  Yes [name], NP  fluticasone (FLOVENT HFA) 110 MCG/ACT inhaler Inhale 2 puffs into the lungs 2 (two) times daily. 01/01/23 01/01/24 Yes Barnetta Chapel, MD  folic acid (FOLVITE) 1 MG tablet TAKE 1 TABLET(1 MG) BY MOUTH DAILY 12/26/22  Yes [name], MD  ipratropium-albuterol (DUONEB) 0.5-2.5 (3) MG/3ML SOLN Take 3 mLs by nebulization every 6 (six) hours as needed. 02/10/23  Yes [name], NP  metFORMIN (GLUCOPHAGE) 500 MG tablet Take 2 tablets by mouth with breakfast and 1 tablet by mouth at dinner 01/20/23  Yes [name], NP  Multiple Vitamin (MULTIVITAMIN WITH MINERALS) TABS tablet Take 1 tablet by mouth every other day.    Yes [provider]  thiamine (VITAMIN B1) 100 MG tablet Take 1 tablet (100 mg total) by mouth daily. 11/10/22  Yes [name], NP  torsemide (DEMADEX) 20 MG tablet Take 1 tablet (20 mg total) by mouth daily. 01/01/23 02/15/23 Yes Barnetta Chapel, MD  famotidine (PEPCID) 20 MG tablet Take 1 tablet (20 mg total) by mouth 2 (two) times daily. Patient not taking: Reported on 02/15/2023 01/01/23 02/06/23  Berton Mount I, MD  metoprolol succinate (TOPROL-XL) 50 MG 24 hr tablet Take 1 tablet (50 mg total) by mouth daily. Take with or immediately following a meal. 02/09/23   [name], NP    Inpatient Medications: Scheduled Meds:  amLODipine  10 mg Oral Daily   atorvastatin  10 mg Oral Daily   azithromycin  500 mg Oral Daily   dapagliflozin propanediol  10 mg Oral QAC breakfast   enoxaparin (LOVENOX) injection  50 mg Subcutaneous Q24H   folic acid  1 mg Oral Daily   furosemide  40 mg Intravenous Daily   insulin aspart  0-15 Units Subcutaneous TID WC   insulin aspart  0-5 Units Subcutaneous QHS   LORazepam  0-4 mg Oral Q6H   Followed by   Melene Muller ON  02/18/2023] LORazepam  0-4 mg Oral Q12H   methylPREDNISolone (SOLU-MEDROL) injection  40 mg Intravenous Q12H   mometasone-formoterol  2 puff Inhalation BID   multivitamin with minerals  1 tablet Oral Daily   nicotine  21 mg Transdermal Daily   thiamine  100 mg Oral Daily   Or   thiamine  100 mg Intravenous Daily   Continuous Infusions:  PRN Meds: acetaminophen **OR** acetaminophen, guaiFENesin-dextromethorphan, ipratropium-albuterol, loratadine, melatonin, ondansetron **OR** ondansetron (ZOFRAN) IV, phenol, sodium chloride, sorbitol  Allergies:    Allergies  Allergen Reactions   Lisinopril     Other reaction(s): renal effects   Codeine Other (See Comments) and Hives    unknown Other reaction(s): Other (See Comments) unknown unknown    Social History:   Social History   Socioeconomic History   Marital  status: Single    Spouse name: Not on file   Number of children: Not on file   Years of education: Not on file   Highest education level: Not on file  Occupational History   Occupation: Pt states not able to work  Tobacco Use   Smoking status: Every Day    Current packs/day: 1.00    Average packs/day: 1 pack/day for 44.0 years (44.0 ttl pk-yrs)    Types: Cigarettes   Smokeless tobacco: Current  Vaping Use   Vaping status: Never Used  Substance and Sexual Activity   Alcohol use: Yes    Comment: 1-24 pt drinks 5 drinks a week   Drug use: Yes    Types: Marijuana   Sexual activity: Not Currently  Other Topics Concern   Not on file  Social History Narrative   Not on file   Social Determinants of Health   Financial Resource Strain: Not on file  Food Insecurity: No Food Insecurity (02/17/2023)   Hunger Vital Sign    Worried About Running Out of Food in the Last Year: Never true    Ran Out of Food in the Last Year: Never true  Transportation Needs: No Transportation Needs (02/17/2023)   PRAPARE - Administrator, Civil Service (Medical): No    Lack of  Transportation (Non-Medical): No  Physical Activity: Not on file  Stress: Not on file  Social Connections: Not on file  Intimate Partner Violence: Not At Risk (02/17/2023)   Humiliation, Afraid, Rape, and Kick questionnaire    Fear of Current or Ex-Partner: No    Emotionally Abused: No    Physically Abused: No    Sexually Abused: No    Family History:    Family History  Problem Relation Age of Onset   COPD Mother    Prostate cancer Father    Colon cancer Neg Hx    Rectal cancer Neg Hx    Stomach cancer Neg Hx    Esophageal cancer Neg Hx      ROS:  Please see the history of present illness.   All other ROS reviewed and negative.     Physical Exam/Data:   Vitals:   02/17/23 0654 02/17/23 0805 02/17/23 0806 02/17/23 1216  BP:   (!) 142/80   Pulse:  (!) 101 (!) 101   Resp:  (!) 22  18  Temp: 98.6 F (37 C) 98.1 F (36.7 C)  98.2 F (36.8 C)  TempSrc:  Oral  Oral  SpO2:   100%   Weight:      Height:        Intake/Output Summary (Last 24 hours) at 02/17/2023 1307 Last data filed at 02/17/2023 1212 Gross per 24 hour  Intake --  Output 1725 ml  Net -1725 ml      02/16/2023    1:40 AM 02/15/2023    5:27 PM 01/20/2023    2:21 PM  Last 3 Weights  Weight (lbs) 239 lb 8 oz 231 lb 7.7 oz 232 lb 4 oz  Weight (kg) 108.636 kg 105 kg 105.348 kg     Body mass index is 31.6 kg/m.  General:  Well nourished, well developed, in no acute distress HEENT: normal Neck: no overt JVD, but difficult to assess Vascular: No carotid bruits; Distal pulses 2+ bilaterally Cardiac:  normal S1, S2; RRR; no murmur  Lungs: mild expiratory wheezing Abd: protuberant abd with dressing in place, colostomy  Ext: no edema Musculoskeletal:  No deformities, BUE and  BLE strength normal and equal Skin: warm and dry  Neuro:  CNs 2-12 intact, no focal abnormalities noted Psych:  Normal affect   EKG:  The EKG was personally reviewed and demonstrates:  Atrial sensed, V Paced Telemetry:  Telemetry was  personally reviewed and demonstrates:  V pacing, rates 100s  Relevant CV Studies:  Echo: 12/2022  IMPRESSIONS     1. Left ventricular ejection fraction, by estimation, is 50 to 55%. The  left ventricle has low normal function. The left ventricle has no regional  wall motion abnormalities. Left ventricular diastolic parameters are  consistent with Grade II diastolic  dysfunction (pseudonormalization).   2. Right ventricular systolic function is normal. The right ventricular  size is normal. Tricuspid regurgitation signal is inadequate for assessing  PA pressure.   3. Left atrial size was mildly dilated.   4. The mitral valve is abnormal. Trivial mitral valve regurgitation. No  evidence of mitral stenosis. Severe mitral annular calcification.   5. The aortic valve was not well visualized. There is moderate  calcification of the aortic valve. Aortic valve regurgitation is not  visualized. No aortic stenosis is present.   6. Aortic dilatation noted. There is borderline dilatation of the  ascending aorta, measuring 39 mm.   Comparison(s): No significant change from prior study.   FINDINGS   Left Ventricle: Left ventricular ejection fraction, by estimation, is 50  to 55%. The left ventricle has low normal function. The left ventricle has  no regional wall motion abnormalities. Definity contrast agent was given  IV to delineate the left  ventricular endocardial borders. The left ventricular internal cavity size  was normal in size. There is no left ventricular hypertrophy. Left  ventricular diastolic parameters are consistent with Grade II diastolic  dysfunction (pseudonormalization).   Right Ventricle: The right ventricular size is normal. No increase in  right ventricular wall thickness. Right ventricular systolic function is  normal. Tricuspid regurgitation signal is inadequate for assessing PA  pressure.   Left Atrium: Left atrial size was mildly dilated.   Right Atrium:  Right atrial size was normal in size.   Pericardium: There is no evidence of pericardial effusion.   Mitral Valve: The mitral valve is abnormal. Severe mitral annular  calcification. Trivial mitral valve regurgitation. No evidence of mitral  valve stenosis.   Tricuspid Valve: The tricuspid valve is not well visualized. Tricuspid  valve regurgitation is not demonstrated. No evidence of tricuspid  stenosis.   Aortic Valve: The aortic valve was not well visualized. There is moderate  calcification of the aortic valve. Aortic valve regurgitation is not  visualized. No aortic stenosis is present. Aortic valve peak gradient  measures 12.7 mmHg.   Pulmonic Valve: The pulmonic valve was not well visualized. Pulmonic valve  regurgitation is not visualized. No evidence of pulmonic stenosis.   Aorta: The aortic root was not well visualized and aortic dilatation  noted. There is borderline dilatation of the ascending aorta, measuring 39  mm.   Venous: The inferior vena cava was not well visualized.   IAS/Shunts: No atrial level shunt detected by color flow Doppler.    Laboratory Data:  High Sensitivity Troponin:   Recent Labs  Lab 02/15/23 1745 02/15/23 1936  TROPONINIHS 33* 31*     Chemistry Recent Labs  Lab 02/15/23 1745 02/16/23 0423 02/17/23 0833  NA 134* 133* 133*  K 3.8 4.0 4.2  CL 95* 95* 97*  CO2 22 25 27   GLUCOSE 135* 235* 302*  BUN 15  16 30*  CREATININE 0.83 1.03 0.99  CALCIUM 9.7 9.3 8.9  MG  --  1.7  --   GFRNONAA >60 >60 >60  ANIONGAP 17* 13 9    Recent Labs  Lab 02/15/23 1745  PROT 7.9  ALBUMIN 3.8  AST 41  ALT 33  ALKPHOS 133*  BILITOT 0.2*   Lipids No results for input(s): "CHOL", "TRIG", "HDL", "LABVLDL", "LDLCALC", "CHOLHDL" in the last 168 hours.  Hematology Recent Labs  Lab 02/15/23 1745 02/16/23 0423 02/17/23 0833  WBC 9.4 11.1* 13.8*  RBC 3.39* 3.40* 3.26*  HGB 11.9* 12.0* 11.9*  HCT 35.9* 35.9* 35.8*  MCV 105.9* 105.6* 109.8*   MCH 35.1* 35.3* 36.5*  MCHC 33.1 33.4 33.2  RDW 16.5* 16.4* 16.4*  PLT 209 192 194   Thyroid  Recent Labs  Lab 02/16/23 0423  TSH 1.632    BNP Recent Labs  Lab 02/15/23 1745  BNP 136.7*    DDimer No results for input(s): "DDIMER" in the last 168 hours.   Radiology/Studies:  DG CHEST PORT 1 VIEW  Result Date: 02/17/2023 CLINICAL DATA:  Respiratory distress. EXAM: PORTABLE CHEST 1 VIEW COMPARISON:  Two days ago FINDINGS: Chronic cardiomegaly. Implantable cardiac device. Diffuse interstitial opacity with cephalized blood flow. Small pleural effusions are possible. IMPRESSION: CHF pattern. Electronically Signed   By: Tiburcio Pea M.D.   On: 02/17/2023 07:06   DG Chest Port 1 View  Result Date: 02/15/2023 CLINICAL DATA:  Shortness of breath and cough EXAM: PORTABLE CHEST 1 VIEW COMPARISON:  Radiograph 12/28/2022 FINDINGS: Stable cardiomegaly. Aortic atherosclerotic calcification. Leadless pacer. Hazy airspace and interstitial opacities greatest in the lower lungs. Pulmonary vascular congestion. Small left-greater-than-right pleural effusions. No pneumothorax. IMPRESSION: Findings compatible with CHF similar to 12/28/2022. Electronically Signed   By: Minerva Fester M.D.   On: 02/15/2023 20:08     Assessment and Plan:   Luis [name] is a 62 y.o. male with a hx of CHB s/p leadless PPM, HTN, HLD, DVT, tobacco use, ETOH, diverticulitis w/ perforation s/p colectomy/ileostomy complicated by wound infections who is being seen 02/17/2023 for the evaluation of CHF at the request of Dr. Alvino Chapel.  Acute hypoxic respiratory failure COPD HFpEF -- suspect respiratory issues are multifactorial in nature. He is not overtly volume overloaded on exam, does have expiratory wheezing on exam -- echo 12/2022 with LVEF of 50-55% -- on torsemide 20mg  daily at home, eats salty foods -- has been receiving intermittent dosing of IV lasix, net - 3.5L. Cr and BP stable. Increased O2 demand this morning, now on  15L [place]. I check O2 sat on his finger while in the room and noted 94% -- will increase lasix to 40mg  BID and follow response -- on farxiga PTA -- on steroids and Zithromax per primary  HTN -- blood pressures stable -- continue norvasc  HLD -- on statin  CHB s/p mirca -- follows with Dr. Lalla Brothers  Per Primary ETOH use Chronic abd wounds DM Tobacco use   Risk Assessment/Risk Scores:        New York Heart Association (NYHA) Functional Class NYHA Class III        For questions or updates, please contact [facility] Please consult [email or web address] for contact info under    Signed, Laverda Page, NP  02/17/2023 1:07 PM  I have examined the patient and reviewed assessment and plan and discussed with patient.  Agree with above as stated.    He has diuresed well thus far.  Continue diuresis.  Also being treated for COPD.  He is able to lie almost completely flat.  Hopefully his spells of dyspnea will become less frequent.   Lance Muss

## 2023-02-17 NOTE — Progress Notes (Signed)
PROGRESS NOTE    Luis [name]  VFI:[id number] DOB: 07-24-1960 DOA: 02/15/2023 PCP: [name], NP     Brief Narrative:  Luis [name] is a 62 y.o. male with medical history significant for diastolic dysfunction with a normal ejection fraction, type 2 diabetes mellitus, history of pacemaker, alcohol abuse, ostomy and chronic abdominal wound who presents with severe cough and shortness of breath which is has worsened over the course of the last 2 to 3 weeks.  The patient he cannot breathe when he leans back.  He has gained at least 8 pounds in the last 3 weeks.  He has a severe cough and he cannot catch his breath at all.  He picked up a new nebulizer machine but that has not helped.  He denies any fevers or chills or chest pain.  The patient was hospitalized 2 months ago for very similar symptoms but at that time he had progressive hypoxia and ended up intubated.  Patient says he has no recollection of his time in the ICU.  His initial diagnosis was congestive heart failure but his discharge diagnosis was pneumonia.   In the emergency department the patient wa found to have O2 sats of 88% on room air.  His chest x-ray was consistent with congestive heart failure so the patient was treated with IV Lasix.  New events last 24 hours / Subjective: States that he is very tired, was not able to get much sleep last night.  States that he was very short of breath overnight.  Now on 15 L high flow oxygen.  Had to receive extra dose of IV Lasix early this morning.  Assessment & Plan:   Principal Problem:   Acute exacerbation of CHF (congestive heart failure) (HCC) Active Problems:   Acute on chronic heart failure with preserved ejection fraction (HFpEF) (HCC)   Essential hypertension   Type 2 diabetes mellitus with hyperlipidemia (HCC)   Tobacco dependence   COPD with acute exacerbation (HCC)   Acute hypoxic respiratory failure -Secondary to combined CHF exacerbation, COPD exacerbation -COVID,  influenza negative, MRSA PCR negative -Chest x-ray showing hazy airspace interstitial opacities greater in the lower lungs, small left greater than right pleural effusion, compatible with CHF -Currently requiring 15 L HF nasal cannula O2  Acute on chronic diastolic CHF exacerbation -Echocardiogram 12/24/2022 revealed EF 50 to 55%, grade 2 diastolic dysfunction -Patient takes Demadex at home -Continue Lasix IV  -Strict I's and O's, daily weight, fluid restriction -Cardiology consult today  COPD exacerbation -Solu-Medrol, azithromycin, breathing treatments  Diabetes mellitus with hyperglycemia -A1c 7.0.  On Farxiga, metformin at home -Sliding scale insulin.  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  Followed by wound clinic as well as general surgery at [facility]   DVT prophylaxis: Lovenox  Code Status: Full code Family Communication: None Disposition Plan: Home Status is: Inpatient Remains inpatient appropriate because: IV Lasix, Solu-Medrol, oxygen use, cardiology consult    Antimicrobials:  Anti-infectives (From admission, onward)    Start     Dose/Rate Route Frequency Ordered Stop   02/16/23 1600  azithromycin (ZITHROMAX) tablet 500 mg        500 mg Oral Daily 02/16/23 1501 02/21/23 0959  Objective: Vitals:   02/17/23 0654 02/17/23 0805 02/17/23 0806 02/17/23 1216  BP:   (!) 142/80   Pulse:  (!) 101 (!) 101   Resp:  (!) 22  18  Temp: 98.6 F (37 C) 98.1 F (36.7 C)  98.2 F (36.8 C)  TempSrc:  Oral  Oral  SpO2:   100%   Weight:      Height:        Intake/Output Summary (Last 24 hours) at 02/17/2023 1230 Last  data filed at 02/17/2023 1212 Gross per 24 hour  Intake --  Output 1975 ml  Net -1975 ml   Filed Weights   02/15/23 1727 02/16/23 0140  Weight: 105 kg 108.6 kg    Examination:  General exam: Appears calm and comfortable, more alert today than yesterday but still remains very fatigued Respiratory system: Diminished breath sounds bilaterally, respiratory effort remains normal without accessory muscle use Cardiovascular system: S1 & S2 heard, RRR. No murmurs.  Nonpitting pedal edema. Gastrointestinal system: Abdomen is nondistended, abdominal wound dressing in place.  Has ileostomy bag Central nervous system: Alert and oriented.  Extremities: Symmetric in appearance  Psychiatry: Judgement and insight appear normal. Mood & affect appropriate.   Data Reviewed: I have personally reviewed following labs and imaging studies  CBC: Recent Labs  Lab 02/15/23 1745 02/16/23 0423 02/17/23 0833  WBC 9.4 11.1* 13.8*  NEUTROABS 6.6  --   --   HGB 11.9* 12.0* 11.9*  HCT 35.9* 35.9* 35.8*  MCV 105.9* 105.6* 109.8*  PLT 209 192 194   Basic Metabolic Panel: Recent Labs  Lab 02/15/23 1745 02/16/23 0423 02/17/23 0833  NA 134* 133* 133*  K 3.8 4.0 4.2  CL 95* 95* 97*  CO2 22 25 27   GLUCOSE 135* 235* 302*  BUN 15 16 30*  CREATININE 0.83 1.03 0.99  CALCIUM 9.7 9.3 8.9  MG  --  1.7  --    GFR: Estimated Creatinine Clearance: 98.7 mL/min (by C-G formula based on SCr of 0.99 mg/dL). Liver Function Tests: Recent Labs  Lab 02/15/23 1745  AST 41  ALT 33  ALKPHOS 133*  BILITOT 0.2*  PROT 7.9  ALBUMIN 3.8   No results for input(s): "LIPASE", "AMYLASE" in the last 168 hours. No results for input(s): "AMMONIA" in the last 168 hours. Coagulation Profile: No results for input(s): "INR", "PROTIME" in the last 168 hours. Cardiac Enzymes: No results for input(s): "CKTOTAL", "CKMB", "CKMBINDEX", "TROPONINI" in the last 168 hours. BNP (last 3 results) No results for input(s): "PROBNP" in  the last 8760 hours. HbA1C: Recent Labs    02/16/23 0423  HGBA1C 7.0*   CBG: Recent Labs  Lab 02/16/23 0648 02/16/23 0831 02/16/23 1137 02/16/23 1813 02/17/23 1214  GLUCAP 231* 279* 307* 337* 244*   Lipid Profile: No results for input(s): "CHOL", "HDL", "LDLCALC", "TRIG", "CHOLHDL", "LDLDIRECT" in the last 72 hours. Thyroid Function Tests: Recent Labs    02/16/23 0423  TSH 1.632   Anemia Panel: No results for input(s): "VITAMINB12", "FOLATE", "FERRITIN", "TIBC", "IRON", "RETICCTPCT" in the last 72 hours. Sepsis Labs: No results for input(s): "PROCALCITON", "LATICACIDVEN" in the last 168 hours.  Recent Results (from the past 240 hour(s))  Resp panel by RT-PCR (RSV, Flu A&B, Covid) Anterior Nasal Swab     Status: None   Collection Time: 02/15/23  5:52 PM   Specimen: Anterior Nasal Swab  Result Value Ref Range Status   SARS Coronavirus 2 by RT PCR NEGATIVE NEGATIVE Final   Influenza A by  PCR NEGATIVE NEGATIVE Final   Influenza B by PCR NEGATIVE NEGATIVE Final    Comment: (NOTE) The Xpert Xpress SARS-CoV-2/FLU/RSV plus assay is intended as an aid in the diagnosis of influenza from Nasopharyngeal swab specimens and should not be used as a sole basis for treatment. Nasal washings and aspirates are unacceptable for Xpert Xpress SARS-CoV-2/FLU/RSV testing.  Fact Sheet for Patients: BloggerCourse.com  Fact Sheet for Healthcare Providers: SeriousBroker.it  This test is not yet approved or cleared by the [place] FDA and has been authorized for detection and/or diagnosis of SARS-CoV-2 by FDA under an Emergency Use Authorization (EUA). This EUA will remain in effect (meaning this test can be used) for the duration of the COVID-19 declaration under Section 564(b)(1) of the Act, 21 U.S.C. section 360bbb-3(b)(1), unless the authorization is terminated or revoked.     Resp Syncytial Virus by PCR NEGATIVE NEGATIVE Final     Comment: (NOTE) Fact Sheet for Patients: BloggerCourse.com  Fact Sheet for Healthcare Providers: SeriousBroker.it  This test is not yet approved or cleared by the [place] FDA and has been authorized for detection and/or diagnosis of SARS-CoV-2 by FDA under an Emergency Use Authorization (EUA). This EUA will remain in effect (meaning this test can be used) for the duration of the COVID-19 declaration under Section 564(b)(1) of the Act, 21 U.S.C. section 360bbb-3(b)(1), unless the authorization is terminated or revoked.  Performed at [facility], [address], [place], [place] [address]   MRSA Next Gen by PCR, Nasal     Status: None   Collection Time: 02/16/23  2:05 AM   Specimen: Nasal Mucosa; Nasal Swab  Result Value Ref Range Status   MRSA by PCR Next Gen NOT DETECTED NOT DETECTED Final    Comment: (NOTE) The GeneXpert MRSA Assay (FDA approved for NASAL specimens only), is one component of a comprehensive MRSA colonization surveillance program. It is not intended to diagnose MRSA infection nor to guide or monitor treatment for MRSA infections. Test performance is not FDA approved in patients less than 77 years old. Performed at [facility], [address], [place], [place] [address]       Radiology Studies: DG CHEST PORT 1 VIEW  Result Date: 02/17/2023 CLINICAL DATA:  Respiratory distress. EXAM: PORTABLE CHEST 1 VIEW COMPARISON:  Two days ago FINDINGS: Chronic cardiomegaly. Implantable cardiac device. Diffuse interstitial opacity with cephalized blood flow. Small pleural effusions are possible. IMPRESSION: CHF pattern. Electronically Signed   By: Tiburcio Pea M.D.   On: 02/17/2023 07:06   DG Chest Port 1 View  Result Date: 02/15/2023 CLINICAL DATA:  Shortness of breath and cough EXAM: PORTABLE CHEST 1 VIEW COMPARISON:  Radiograph 12/28/2022 FINDINGS: Stable cardiomegaly. Aortic atherosclerotic  calcification. Leadless pacer. Hazy airspace and interstitial opacities greatest in the lower lungs. Pulmonary vascular congestion. Small left-greater-than-right pleural effusions. No pneumothorax. IMPRESSION: Findings compatible with CHF similar to 12/28/2022. Electronically Signed   By: Minerva Fester M.D.   On: 02/15/2023 20:08      Scheduled Meds:  amLODipine  10 mg Oral Daily   atorvastatin  10 mg Oral Daily   azithromycin  500 mg Oral Daily   dapagliflozin propanediol  10 mg Oral QAC breakfast   enoxaparin (LOVENOX) injection  50 mg Subcutaneous Q24H   folic acid  1 mg Oral Daily   furosemide  40 mg Intravenous Daily   insulin aspart  0-15 Units Subcutaneous TID WC   insulin aspart  0-5 Units Subcutaneous QHS  ipratropium-albuterol  3 mL Nebulization Q6H   LORazepam  0-4 mg Oral Q6H   Followed by   Melene Muller ON 02/18/2023] LORazepam  0-4 mg Oral Q12H   methylPREDNISolone (SOLU-MEDROL) injection  40 mg Intravenous Q12H   mometasone-formoterol  2 puff Inhalation BID   multivitamin with minerals  1 tablet Oral Daily   nicotine  21 mg Transdermal Daily   thiamine  100 mg Oral Daily   Or   thiamine  100 mg Intravenous Daily   Continuous Infusions:   LOS: 2 days   Time spent: 35 minutes   Noralee Stain, DO [facility] 02/17/2023, 12:30 PM   Available via Epic secure chat 7am-7pm After these hours, please refer to coverage provider listed on [email or web address]

## 2023-02-18 DIAGNOSIS — I5033 Acute on chronic diastolic (congestive) heart failure: Secondary | ICD-10-CM | POA: Diagnosis not present

## 2023-02-18 DIAGNOSIS — I5031 Acute diastolic (congestive) heart failure: Secondary | ICD-10-CM

## 2023-02-18 LAB — BASIC METABOLIC PANEL
Anion gap: 9 (ref 5–15)
BUN: 39 mg/dL — ABNORMAL HIGH (ref 8–23)
CO2: 28 mmol/L (ref 22–32)
Calcium: 8.7 mg/dL — ABNORMAL LOW (ref 8.9–10.3)
Chloride: 97 mmol/L — ABNORMAL LOW (ref 98–111)
Creatinine, Ser: 1.05 mg/dL (ref 0.61–1.24)
GFR, Estimated: >60 mL/min (ref >60)
Glucose, Bld: 217 mg/dL — ABNORMAL HIGH (ref 70–99)
Potassium: 4.6 mmol/L (ref 3.5–5.1)
Sodium: 134 mmol/L — ABNORMAL LOW (ref 135–145)

## 2023-02-18 LAB — CBC
HCT: 36 % — ABNORMAL LOW (ref 39.0–52.0)
Hemoglobin: 11.7 g/dL — ABNORMAL LOW (ref 13.0–17.0)
MCH: 35.2 pg — ABNORMAL HIGH (ref 26.0–34.0)
MCHC: 32.5 g/dL (ref 30.0–36.0)
MCV: 108.4 fL — ABNORMAL HIGH (ref 80.0–100.0)
Platelets: 191 10*3/uL (ref 150–400)
RBC: 3.32 MIL/uL — ABNORMAL LOW (ref 4.22–5.81)
RDW: 15.9 % — ABNORMAL HIGH (ref 11.5–15.5)
WBC: 12.5 10*3/uL — ABNORMAL HIGH (ref 4.0–10.5)
nRBC: 0.2 % (ref 0.0–0.2)

## 2023-02-18 LAB — GLUCOSE, CAPILLARY
Glucose-Capillary: 256 mg/dL — ABNORMAL HIGH (ref 70–99)
Glucose-Capillary: 381 mg/dL — ABNORMAL HIGH (ref 70–99)

## 2023-02-18 MED ORDER — METHYLPREDNISOLONE SODIUM SUCC 40 MG IJ SOLR
40.0000 mg | Freq: Two times a day (BID) | INTRAMUSCULAR | Status: AC
  Administered 2023-02-18: 40 mg via INTRAVENOUS
  Filled 2023-02-18: qty 1

## 2023-02-18 MED ORDER — PREDNISONE 20 MG PO TABS
50.0000 mg | ORAL_TABLET | Freq: Every day | ORAL | Status: DC
  Administered 2023-02-19 – 2023-02-20 (×2): 50 mg via ORAL
  Filled 2023-02-18 (×2): qty 1

## 2023-02-18 MED ORDER — INSULIN GLARGINE-YFGN 100 UNIT/ML ~~LOC~~ SOLN
15.0000 [IU] | Freq: Every day | SUBCUTANEOUS | Status: DC
  Administered 2023-02-18: 15 [IU] via SUBCUTANEOUS
  Filled 2023-02-18 (×2): qty 0.15

## 2023-02-18 NOTE — Plan of Care (Signed)

## 2023-02-18 NOTE — Progress Notes (Signed)
Progress Note  Patient Name: Luis [name] Date of Encounter: 02/18/2023  Primary Cardiologist:   None   Subjective   Breathing is slightly better.  Still on 13 liters but not in distress  Inpatient Medications    Scheduled Meds:  amLODipine  10 mg Oral Daily   atorvastatin  10 mg Oral Daily   azithromycin  500 mg Oral Daily   dapagliflozin propanediol  10 mg Oral QAC breakfast   enoxaparin (LOVENOX) injection  50 mg Subcutaneous Q24H   folic acid  1 mg Oral Daily   furosemide  40 mg Intravenous BID   insulin aspart  0-15 Units Subcutaneous TID WC   insulin aspart  0-5 Units Subcutaneous QHS   LORazepam  0-4 mg Oral Q12H   methylPREDNISolone (SOLU-MEDROL) injection  40 mg Intravenous Q12H   mometasone-formoterol  2 puff Inhalation BID   multivitamin with minerals  1 tablet Oral Daily   nicotine  21 mg Transdermal Daily   thiamine  100 mg Oral Daily   Or   thiamine  100 mg Intravenous Daily   Continuous Infusions:  PRN Meds: acetaminophen **OR** acetaminophen, guaiFENesin-dextromethorphan, ipratropium-albuterol, loratadine, melatonin, ondansetron **OR** ondansetron (ZOFRAN) IV, phenol, sodium chloride, sorbitol   Vital Signs    Vitals:   02/18/23 0515 02/18/23 0600 02/18/23 0856 02/18/23 1057  BP:  (!) 139/94    Pulse:  96    Resp: 18  20   Temp: 97.9 F (36.6 C)  97.9 F (36.6 C)   TempSrc: Oral  Oral   SpO2:    98%  Weight:      Height:        Intake/Output Summary (Last 24 hours) at 02/18/2023 1109 Last data filed at 02/18/2023 0950 Gross per 24 hour  Intake 360 ml  Output 2675 ml  Net -2315 ml   Filed Weights   02/15/23 1727 02/16/23 0140 02/18/23 0500  Weight: 105 kg 108.6 kg 105.2 kg    Telemetry    Ventricular Pacing - Personally Reviewed  ECG    NA - Personally Reviewed  Physical Exam   GEN: No acute distress.   Neck: No  JVD Cardiac: RRR, no murmurs, rubs, or gallops.  Respiratory: Clear  to auscultation bilaterally. GI: Soft,  nontender, non-distended , distended with colostomy bag intact. MS: No  edema; No deformity. Neuro:  Nonfocal  Psych: Normal affect   Labs    Chemistry Recent Labs  Lab 02/15/23 1745 02/16/23 0423 02/17/23 0833  NA 134* 133* 133*  K 3.8 4.0 4.2  CL 95* 95* 97*  CO2 22 25 27   GLUCOSE 135* 235* 302*  BUN 15 16 30*  CREATININE 0.83 1.03 0.99  CALCIUM 9.7 9.3 8.9  PROT 7.9  --   --   ALBUMIN 3.8  --   --   AST 41  --   --   ALT 33  --   --   ALKPHOS 133*  --   --   BILITOT 0.2*  --   --   GFRNONAA >60 >60 >60  ANIONGAP 17* 13 9     Hematology Recent Labs  Lab 02/16/23 0423 02/17/23 0833 02/18/23 0427  WBC 11.1* 13.8* 12.5*  RBC 3.40* 3.26* 3.32*  HGB 12.0* 11.9* 11.7*  HCT 35.9* 35.8* 36.0*  MCV 105.6* 109.8* 108.4*  MCH 35.3* 36.5* 35.2*  MCHC 33.4 33.2 32.5  RDW 16.4* 16.4* 15.9*  PLT 192 194 191    Cardiac EnzymesNo results for input(s): "TROPONINI"  in the last 168 hours. No results for input(s): "TROPIPOC" in the last 168 hours.   BNP Recent Labs  Lab 02/15/23 1745  BNP 136.7*     DDimer No results for input(s): "DDIMER" in the last 168 hours.   Radiology    DG CHEST PORT 1 VIEW  Result Date: 02/17/2023 CLINICAL DATA:  Respiratory distress. EXAM: PORTABLE CHEST 1 VIEW COMPARISON:  Two days ago FINDINGS: Chronic cardiomegaly. Implantable cardiac device. Diffuse interstitial opacity with cephalized blood flow. Small pleural effusions are possible. IMPRESSION: CHF pattern. Electronically Signed   By: Tiburcio Pea M.D.   On: 02/17/2023 07:06    Cardiac Studies   Echo:    1. Left ventricular ejection fraction, by estimation, is 50 to 55%. The  left ventricle has low normal function. The left ventricle has no regional  wall motion abnormalities. Left ventricular diastolic parameters are  consistent with Grade II diastolic  dysfunction (pseudonormalization).   2. Right ventricular systolic function is normal. The right ventricular  size is normal.  Tricuspid regurgitation signal is inadequate for assessing  PA pressure.   3. Left atrial size was mildly dilated.   4. The mitral valve is abnormal. Trivial mitral valve regurgitation. No  evidence of mitral stenosis. Severe mitral annular calcification.   5. The aortic valve was not well visualized. There is moderate  calcification of the aortic valve. Aortic valve regurgitation is not  visualized. No aortic stenosis is present.   6. Aortic dilatation noted. There is borderline dilatation of the  ascending aorta, measuring 39 mm.    Patient Profile     62 y.o. male with a hx of CHB s/p leadless PPM, HTN, HLD, DVT, tobacco use, ETOH, diverticulitis w/ perforation s/p colectomy/ileostomy complicated by wound infections who is being seen 02/17/2023 for the evaluation of CHF at the request of Dr. Alvino Chapel.   Assessment & Plan    Acute hypoxic respiratory failure/HFpEF:  Respiratory distress is thought to be multifactorial.    IV diuresis yesterday with good UO.  Net negative 1500 cc.  BMET not back.   Weaned down to 13 liters and no distress.   Continue IV diuresis.     HTN:  Continue Norvasc.    HLD:    Continue statin.     CHB s/p mirca:  I will review device settings with the EP.    For questions or updates, please contact [facility] Please consult [email or web address] for contact info under Cardiology/STEMI.   Signed, Rollene Rotunda, MD  02/18/2023, 11:09 AM

## 2023-02-18 NOTE — Progress Notes (Signed)
PROGRESS NOTE    Luis [name]  KVQ:[id number] DOB: 01/09/1961 DOA: 02/15/2023 PCP: [name], NP     Brief Narrative:  Luis [name] is a 62 y.o. male with medical history significant for diastolic dysfunction with a normal ejection fraction, type 2 diabetes mellitus, history of pacemaker, alcohol abuse, ostomy and chronic abdominal wound who presents with severe cough and shortness of breath which is has worsened over the course of the last 2 to 3 weeks.  The patient he cannot breathe when he leans back.  He has gained at least 8 pounds in the last 3 weeks.  He has a severe cough and he cannot catch his breath at all.  He picked up a new nebulizer machine but that has not helped.  He denies any fevers or chills or chest pain.  The patient was hospitalized 2 months ago for very similar symptoms but at that time he had progressive hypoxia and ended up intubated.  Patient says he has no recollection of his time in the ICU.  His initial diagnosis was congestive heart failure but his discharge diagnosis was pneumonia.   In the emergency department the patient wa found to have O2 sats of 88% on room air.  His chest x-ray was consistent with congestive heart failure so the patient was treated with IV Lasix.  New events last 24 hours / Subjective: Patient doing much better compared to couple of days ago.  Much more alert and interactive.  Still on 13 L high flow oxygen.  Assessment & Plan:   Principal Problem:   Acute exacerbation of CHF (congestive heart failure) (HCC) Active Problems:   Acute on chronic heart failure with preserved ejection fraction (HFpEF) (HCC)   Essential hypertension   Type 2 diabetes mellitus with hyperlipidemia (HCC)   Tobacco dependence   COPD with acute exacerbation (HCC)   Acute hypoxic respiratory failure -Secondary to combined CHF exacerbation, COPD exacerbation -COVID, influenza negative, MRSA PCR negative -Chest x-ray showing hazy airspace interstitial  opacities greater in the lower lungs, small left greater than right pleural effusion, compatible with CHF -Currently requiring 13 L HF nasal cannula O2  Acute on chronic diastolic CHF exacerbation -Echocardiogram 12/24/2022 revealed EF 50 to 55%, grade 2 diastolic dysfunction -Patient takes Demadex at home -Continue Lasix IV BID  -Strict I's and O's, daily weight, fluid restriction -Cardiology following  COPD exacerbation -Solu-Medrol, azithromycin, breathing treatments -Plan to de-escalate to prednisone tomorrow morning  Diabetes mellitus with hyperglycemia -A1c 7.0.  On Farxiga, metformin at home -Sliding scale insulin.  Continues to be hyperglycemic with Solu-Medrol use.  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  Followed by wound clinic as well as general surgery at [facility]   DVT prophylaxis: Lovenox  Code Status: Full code Family Communication: None Disposition Plan: Home Status is: Inpatient Remains inpatient appropriate because: IV Lasix, Solu-Medrol, oxygen use, cardiology consulted     Antimicrobials:  Anti-infectives (From admission, onward)    Start     Dose/Rate Route Frequency Ordered Stop   02/16/23 1600  azithromycin (ZITHROMAX) tablet 500 mg        500 mg Oral Daily 02/16/23 1501 02/21/23 0959        Objective: Vitals:   02/18/23  0515 02/18/23 0600 02/18/23 0856 02/18/23 1057  BP:  (!) 139/94    Pulse:  96    Resp: 18  20   Temp: 97.9 F (36.6 C)  97.9 F (36.6 C)   TempSrc: Oral  Oral   SpO2:    98%  Weight:      Height:        Intake/Output Summary (Last 24 hours) at 02/18/2023 1148 Last data filed at  02/18/2023 0950 Gross per 24 hour  Intake 360 ml  Output 2675 ml  Net -2315 ml   Filed Weights   02/15/23 1727 02/16/23 0140 02/18/23 0500  Weight: 105 kg 108.6 kg 105.2 kg    Examination:  General exam: Appears calm and comfortable Respiratory system: Diminished breath sounds bilaterally, respiratory effort remains normal without accessory muscle use Cardiovascular system: S1 & S2 heard, RRR. No murmurs.  Nonpitting pedal edema. Gastrointestinal system: Abdomen is nondistended, abdominal wound dressing in place.  Has ileostomy bag Central nervous system: Alert and oriented.  Extremities: Symmetric in appearance  Psychiatry: Judgement and insight appear normal. Mood & affect appropriate.   Data Reviewed: I have personally reviewed following labs and imaging studies  CBC: Recent Labs  Lab 02/15/23 1745 02/16/23 0423 02/17/23 0833 02/18/23 0427  WBC 9.4 11.1* 13.8* 12.5*  NEUTROABS 6.6  --   --   --   HGB 11.9* 12.0* 11.9* 11.7*  HCT 35.9* 35.9* 35.8* 36.0*  MCV 105.9* 105.6* 109.8* 108.4*  PLT 209 192 194 191   Basic Metabolic Panel: Recent Labs  Lab 02/15/23 1745 02/16/23 0423 02/17/23 0833  NA 134* 133* 133*  K 3.8 4.0 4.2  CL 95* 95* 97*  CO2 22 25 27   GLUCOSE 135* 235* 302*  BUN 15 16 30*  CREATININE 0.83 1.03 0.99  CALCIUM 9.7 9.3 8.9  MG  --  1.7  --    GFR: Estimated Creatinine Clearance: 97.2 mL/min (by C-G formula based on SCr of 0.99 mg/dL). Liver Function Tests: Recent Labs  Lab 02/15/23 1745  AST 41  ALT 33  ALKPHOS 133*  BILITOT 0.2*  PROT 7.9  ALBUMIN 3.8   No results for input(s): "LIPASE", "AMYLASE" in the last 168 hours. No results for input(s): "AMMONIA" in the last 168 hours. Coagulation Profile: No results for input(s): "INR", "PROTIME" in the last 168 hours. Cardiac Enzymes: No results for input(s): "CKTOTAL", "CKMB", "CKMBINDEX", "TROPONINI" in the last 168 hours. BNP (last 3 results) No results for input(s): "PROBNP" in the  last 8760 hours. HbA1C: Recent Labs    02/16/23 0423  HGBA1C 7.0*   CBG: Recent Labs  Lab 02/16/23 1137 02/16/23 1813 02/17/23 1214 02/17/23 1725 02/17/23 2055  GLUCAP 307* 337* 244* 332* 368*   Lipid Profile: No results for input(s): "CHOL", "HDL", "LDLCALC", "TRIG", "CHOLHDL", "LDLDIRECT" in the last 72 hours. Thyroid Function Tests: Recent Labs    02/16/23 0423  TSH 1.632   Anemia Panel: No results for input(s): "VITAMINB12", "FOLATE", "FERRITIN", "TIBC", "IRON", "RETICCTPCT" in the last 72 hours. Sepsis Labs: No results for input(s): "PROCALCITON", "LATICACIDVEN" in the last 168 hours.  Recent Results (from the past 240 hour(s))  Resp panel by RT-PCR (RSV, Flu A&B, Covid) Anterior Nasal Swab     Status: None   Collection Time: 02/15/23  5:52 PM   Specimen: Anterior Nasal Swab  Result Value Ref Range Status   SARS Coronavirus 2 by RT PCR NEGATIVE NEGATIVE Final   Influenza A by PCR NEGATIVE NEGATIVE Final  Influenza B by PCR NEGATIVE NEGATIVE Final    Comment: (NOTE) The Xpert Xpress SARS-CoV-2/FLU/RSV plus assay is intended as an aid in the diagnosis of influenza from Nasopharyngeal swab specimens and should not be used as a sole basis for treatment. Nasal washings and aspirates are unacceptable for Xpert Xpress SARS-CoV-2/FLU/RSV testing.  Fact Sheet for Patients: BloggerCourse.com  Fact Sheet for Healthcare Providers: SeriousBroker.it  This test is not yet approved or cleared by the [place] FDA and has been authorized for detection and/or diagnosis of SARS-CoV-2 by FDA under an Emergency Use Authorization (EUA). This EUA will remain in effect (meaning this test can be used) for the duration of the COVID-19 declaration under Section 564(b)(1) of the Act, 21 U.S.C. section 360bbb-3(b)(1), unless the authorization is terminated or revoked.     Resp Syncytial Virus by PCR NEGATIVE NEGATIVE Final     Comment: (NOTE) Fact Sheet for Patients: BloggerCourse.com  Fact Sheet for Healthcare Providers: SeriousBroker.it  This test is not yet approved or cleared by the [place] FDA and has been authorized for detection and/or diagnosis of SARS-CoV-2 by FDA under an Emergency Use Authorization (EUA). This EUA will remain in effect (meaning this test can be used) for the duration of the COVID-19 declaration under Section 564(b)(1) of the Act, 21 U.S.C. section 360bbb-3(b)(1), unless the authorization is terminated or revoked.  Performed at [facility], [address] [place], [place] [address]   MRSA Next Gen by PCR, Nasal     Status: None   Collection Time: 02/16/23  2:05 AM   Specimen: Nasal Mucosa; Nasal Swab  Result Value Ref Range Status   MRSA by PCR Next Gen NOT DETECTED NOT DETECTED Final    Comment: (NOTE) The GeneXpert MRSA Assay (FDA approved for NASAL specimens only), is one component of a comprehensive MRSA colonization surveillance program. It is not intended to diagnose MRSA infection nor to guide or monitor treatment for MRSA infections. Test performance is not FDA approved in patients less than 76 years old. Performed at [facility], [address], [place], [place] [address]       Radiology Studies: DG CHEST PORT 1 VIEW  Result Date: 02/17/2023 CLINICAL DATA:  Respiratory distress. EXAM: PORTABLE CHEST 1 VIEW COMPARISON:  Two days ago FINDINGS: Chronic cardiomegaly. Implantable cardiac device. Diffuse interstitial opacity with cephalized blood flow. Small pleural effusions are possible. IMPRESSION: CHF pattern. Electronically Signed   By: Tiburcio Pea M.D.   On: 02/17/2023 07:06      Scheduled Meds:  amLODipine  10 mg Oral Daily   atorvastatin  10 mg Oral Daily   azithromycin  500 mg Oral Daily   dapagliflozin propanediol  10 mg Oral QAC breakfast   enoxaparin (LOVENOX) injection  50 mg  Subcutaneous Q24H   folic acid  1 mg Oral Daily   furosemide  40 mg Intravenous BID   insulin aspart  0-15 Units Subcutaneous TID WC   insulin aspart  0-5 Units Subcutaneous QHS   LORazepam  0-4 mg Oral Q12H   methylPREDNISolone (SOLU-MEDROL) injection  40 mg Intravenous Q12H   mometasone-formoterol  2 puff Inhalation BID   multivitamin with minerals  1 tablet Oral Daily   nicotine  21 mg Transdermal Daily   thiamine  100 mg Oral Daily   Or   thiamine  100 mg Intravenous Daily   Continuous Infusions:   LOS: 3 days   Time spent: 25 minutes   Noralee Stain, DO [facility] 02/18/2023, 11:48 AM   Available via Epic secure chat 7am-7pm After these hours, please refer to coverage provider listed on [email or web address]

## 2023-02-19 DIAGNOSIS — I5033 Acute on chronic diastolic (congestive) heart failure: Secondary | ICD-10-CM | POA: Diagnosis not present

## 2023-02-19 DIAGNOSIS — E785 Hyperlipidemia, unspecified: Secondary | ICD-10-CM | POA: Insufficient documentation

## 2023-02-19 DIAGNOSIS — I5031 Acute diastolic (congestive) heart failure: Secondary | ICD-10-CM | POA: Diagnosis not present

## 2023-02-19 DIAGNOSIS — I2489 Other forms of acute ischemic heart disease: Secondary | ICD-10-CM | POA: Insufficient documentation

## 2023-02-19 LAB — CBC
HCT: 37.7 % — ABNORMAL LOW (ref 39.0–52.0)
Hemoglobin: 12.4 g/dL — ABNORMAL LOW (ref 13.0–17.0)
MCH: 36.3 pg — ABNORMAL HIGH (ref 26.0–34.0)
MCHC: 32.9 g/dL (ref 30.0–36.0)
MCV: 110.2 fL — ABNORMAL HIGH (ref 80.0–100.0)
Platelets: 190 10*3/uL (ref 150–400)
RBC: 3.42 MIL/uL — ABNORMAL LOW (ref 4.22–5.81)
RDW: 15.6 % — ABNORMAL HIGH (ref 11.5–15.5)
WBC: 11.3 10*3/uL — ABNORMAL HIGH (ref 4.0–10.5)
nRBC: 0 % (ref 0.0–0.2)

## 2023-02-19 LAB — BASIC METABOLIC PANEL
Anion gap: 11 (ref 5–15)
BUN: 39 mg/dL — ABNORMAL HIGH (ref 8–23)
CO2: 29 mmol/L (ref 22–32)
Calcium: 9.1 mg/dL (ref 8.9–10.3)
Chloride: 98 mmol/L (ref 98–111)
Creatinine, Ser: 0.97 mg/dL (ref 0.61–1.24)
GFR, Estimated: >60 mL/min (ref >60)
Glucose, Bld: 271 mg/dL — ABNORMAL HIGH (ref 70–99)
Potassium: 4.6 mmol/L (ref 3.5–5.1)
Sodium: 138 mmol/L (ref 135–145)

## 2023-02-19 LAB — GLUCOSE, CAPILLARY
Glucose-Capillary: 247 mg/dL — ABNORMAL HIGH (ref 70–99)
Glucose-Capillary: 177 mg/dL — ABNORMAL HIGH (ref 70–99)
Glucose-Capillary: 305 mg/dL — ABNORMAL HIGH (ref 70–99)
Glucose-Capillary: 110 mg/dL — ABNORMAL HIGH (ref 70–99)

## 2023-02-19 MED ORDER — IPRATROPIUM-ALBUTEROL 0.5-2.5 (3) MG/3ML IN SOLN
3.0000 mL | Freq: Four times a day (QID) | RESPIRATORY_TRACT | Status: DC
  Administered 2023-02-19: 3 mL via RESPIRATORY_TRACT
  Filled 2023-02-19: qty 3

## 2023-02-19 MED ORDER — INSULIN ASPART 100 UNIT/ML IJ SOLN
5.0000 [IU] | Freq: Three times a day (TID) | INTRAMUSCULAR | Status: DC
  Administered 2023-02-19 – 2023-02-20 (×4): 5 [IU] via SUBCUTANEOUS

## 2023-02-19 MED ORDER — IPRATROPIUM-ALBUTEROL 0.5-2.5 (3) MG/3ML IN SOLN
3.0000 mL | Freq: Three times a day (TID) | RESPIRATORY_TRACT | Status: DC
  Administered 2023-02-20: 3 mL via RESPIRATORY_TRACT
  Filled 2023-02-19: qty 3

## 2023-02-19 MED ORDER — INSULIN GLARGINE-YFGN 100 UNIT/ML ~~LOC~~ SOLN
20.0000 [IU] | Freq: Every day | SUBCUTANEOUS | Status: DC
  Administered 2023-02-19 – 2023-02-20 (×2): 20 [IU] via SUBCUTANEOUS
  Filled 2023-02-19 (×2): qty 0.2

## 2023-02-19 MED ORDER — ALPRAZOLAM 0.5 MG PO TABS
1.0000 mg | ORAL_TABLET | Freq: Two times a day (BID) | ORAL | Status: DC | PRN
  Administered 2023-02-19 – 2023-02-20 (×2): 1 mg via ORAL
  Filled 2023-02-19 (×2): qty 2

## 2023-02-19 NOTE — Progress Notes (Signed)
Pts sister called Laurian Brim [phone]. She would like someone to call her with plan of care.

## 2023-02-19 NOTE — Progress Notes (Addendum)
PROGRESS NOTE    Luis [name]  UYQ:[id number] DOB: 10-05-1960 DOA: 02/15/2023 PCP: [name], NP     Brief Narrative:  Luis [name] is a 62 y.o. male with medical history significant for diastolic dysfunction with a normal ejection fraction, type 2 diabetes mellitus, history of pacemaker, alcohol abuse, ostomy and chronic abdominal wound who presents with severe cough and shortness of breath which is has worsened over the course of the last 2 to 3 weeks.  The patient he cannot breathe when he leans back.  He has gained at least 8 pounds in the last 3 weeks.  He has a severe cough and he cannot catch his breath at all.  He picked up a new nebulizer machine but that has not helped.  He denies any fevers or chills or chest pain.  The patient was hospitalized 2 months ago for very similar symptoms but at that time he had progressive hypoxia and ended up intubated.  Patient says he has no recollection of his time in the ICU.  His initial diagnosis was congestive heart failure but his discharge diagnosis was pneumonia.   In the emergency department the patient wa found to have O2 sats of 88% on room air.  His chest x-ray was consistent with congestive heart failure so the patient was treated with IV Lasix.  New events last 24 hours / Subjective: Asking when he can go home.  He was on 10 L oxygen, I weaned him down to 9 L today.  Discussed with him that we will continue to wean him down on oxygen but he cannot go home until he is down to 2 to 3 L and we can arrange for home oxygen at that time.  Urine output recorded 4615 mL yesterday  Assessment & Plan:   Principal Problem:   Acute exacerbation of CHF (congestive heart failure) (HCC) Active Problems:   Acute on chronic heart failure with preserved ejection fraction (HFpEF) (HCC)   Essential hypertension   Type 2 diabetes mellitus with hyperlipidemia (HCC)   Tobacco dependence   COPD with acute exacerbation (HCC)   Acute hypoxic respiratory  failure -Secondary to combined CHF exacerbation, COPD exacerbation -COVID, influenza negative, MRSA PCR negative -Chest x-ray showing hazy airspace interstitial opacities greater in the lower lungs, small left greater than right pleural effusion, compatible with CHF -Currently requiring 9 L HF nasal cannula O2.  Continue to wean as able to maintain saturation >90%  Acute on chronic diastolic CHF exacerbation -Echocardiogram 12/24/2022 revealed EF 50 to 55%, grade 2 diastolic dysfunction -Patient takes Demadex at home -Continue Lasix IV BID, Farxiga -Strict I's and O's, daily weight, fluid restriction -Cardiology following  COPD exacerbation -Prednisone, azithromycin, breathing treatments  Diabetes mellitus with hyperglycemia -A1c 7.0.  On Farxiga, metformin at home -Semglee, NovoLog, sliding scale insulin.  Dose adjusted today  Hypertension -Norvasc  Hyperlipidemia -Lipitor  Demand ischemia -Troponin 33, 31  History of alcohol abuse -CIWA protocol.  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  Followed by wound clinic as well as general surgery at [facility]   DVT prophylaxis: Lovenox  Code Status: Full code Family Communication: None, called sister on patient's request, no answer  Disposition Plan: Home Status is: Inpatient Remains inpatient appropriate because: IV Lasix, high flow oxygen use   Antimicrobials:  Anti-infectives (From admission, onward)  Start     Dose/Rate Route Frequency Ordered Stop   02/16/23 1600  azithromycin (ZITHROMAX) tablet 500 mg        500 mg Oral Daily 02/16/23 1501 02/21/23 0959        Objective: Vitals:   02/19/23 0605 02/19/23 0810 02/19/23 0826 02/19/23 1211  BP: 134/88 (!) 134/93    Pulse: 93 100  98  Resp: 19 16     Temp: 98.1 F (36.7 C)     TempSrc: Oral     SpO2: 100% 100% 100%   Weight: 103.4 kg     Height:        Intake/Output Summary (Last 24 hours) at 02/19/2023 1231 Last data filed at 02/19/2023 1214 Gross per 24 hour  Intake 1477 ml  Output 4615 ml  Net -3138 ml   Filed Weights   02/16/23 0140 02/18/23 0500 02/19/23 0605  Weight: 108.6 kg 105.2 kg 103.4 kg    Examination:  General exam: Appears calm and comfortable Respiratory system: CTAB.  On 9 L nasal cannula O2, no conversational dyspnea Cardiovascular system: S1 & S2 heard, RRR. No murmurs.  Nonpitting pedal edema. Gastrointestinal system: Abdomen is nondistended, abdominal wound dressing in place.  Has ileostomy bag Central nervous system: Alert and oriented.  Extremities: Symmetric in appearance  Psychiatry: Judgement and insight appear normal. Mood & affect appropriate.   Data Reviewed: I have personally reviewed following labs and imaging studies  CBC: Recent Labs  Lab 02/15/23 1745 02/16/23 0423 02/17/23 0833 02/18/23 0427 02/19/23 0424  WBC 9.4 11.1* 13.8* 12.5* 11.3*  NEUTROABS 6.6  --   --   --   --   HGB 11.9* 12.0* 11.9* 11.7* 12.4*  HCT 35.9* 35.9* 35.8* 36.0* 37.7*  MCV 105.9* 105.6* 109.8* 108.4* 110.2*  PLT 209 192 194 191 190   Basic Metabolic Panel: Recent Labs  Lab 02/15/23 1745 02/16/23 0423 02/17/23 0833 02/18/23 1206 02/19/23 0424  NA 134* 133* 133* 134* 138  K 3.8 4.0 4.2 4.6 4.6  CL 95* 95* 97* 97* 98  CO2 22 25 27 28 29   GLUCOSE 135* 235* 302* 217* 271*  BUN 15 16 30* 39* 39*  CREATININE 0.83 1.03 0.99 1.05 0.97  CALCIUM 9.7 9.3 8.9 8.7* 9.1  MG  --  1.7  --   --   --    GFR: Estimated Creatinine Clearance: 98.5 mL/min (by C-G formula based on SCr of 0.97 mg/dL). Liver Function Tests: Recent Labs  Lab 02/15/23 1745  AST 41  ALT 33  ALKPHOS 133*  BILITOT 0.2*  PROT 7.9  ALBUMIN 3.8   No results for input(s): "LIPASE", "AMYLASE" in the last 168 hours. No results for  input(s): "AMMONIA" in the last 168 hours. Coagulation Profile: No results for input(s): "INR", "PROTIME" in the last 168 hours. Cardiac Enzymes: No results for input(s): "CKTOTAL", "CKMB", "CKMBINDEX", "TROPONINI" in the last 168 hours. BNP (last 3 results) No results for input(s): "PROBNP" in the last 8760 hours. HbA1C: No results for input(s): "HGBA1C" in the last 72 hours.  CBG: Recent Labs  Lab 02/17/23 2055 02/18/23 1650 02/18/23 2107 02/19/23 0748 02/19/23 1139  GLUCAP 368* 256* 381* 247* 177*   Lipid Profile: No results for input(s): "CHOL", "HDL", "LDLCALC", "TRIG", "CHOLHDL", "LDLDIRECT" in the last 72 hours. Thyroid Function Tests: No results for input(s): "TSH", "T4TOTAL", "FREET4", "T3FREE", "THYROIDAB" in the last 72 hours.  Anemia Panel: No results for input(s): "VITAMINB12", "FOLATE", "FERRITIN", "TIBC", "IRON", "RETICCTPCT" in the last 72  hours. Sepsis Labs: No results for input(s): "PROCALCITON", "LATICACIDVEN" in the last 168 hours.  Recent Results (from the past 240 hour(s))  Resp panel by RT-PCR (RSV, Flu A&B, Covid) Anterior Nasal Swab     Status: None   Collection Time: 02/15/23  5:52 PM   Specimen: Anterior Nasal Swab  Result Value Ref Range Status   SARS Coronavirus 2 by RT PCR NEGATIVE NEGATIVE Final   Influenza A by PCR NEGATIVE NEGATIVE Final   Influenza B by PCR NEGATIVE NEGATIVE Final    Comment: (NOTE) The Xpert Xpress SARS-CoV-2/FLU/RSV plus assay is intended as an aid in the diagnosis of influenza from Nasopharyngeal swab specimens and should not be used as a sole basis for treatment. Nasal washings and aspirates are unacceptable for Xpert Xpress SARS-CoV-2/FLU/RSV testing.  Fact Sheet for Patients: BloggerCourse.com  Fact Sheet for Healthcare Providers: SeriousBroker.it  This test is not yet approved or cleared by the [place] FDA and has been authorized for detection and/or  diagnosis of SARS-CoV-2 by FDA under an Emergency Use Authorization (EUA). This EUA will remain in effect (meaning this test can be used) for the duration of the COVID-19 declaration under Section 564(b)(1) of the Act, 21 U.S.C. section 360bbb-3(b)(1), unless the authorization is terminated or revoked.     Resp Syncytial Virus by PCR NEGATIVE NEGATIVE Final    Comment: (NOTE) Fact Sheet for Patients: BloggerCourse.com  Fact Sheet for Healthcare Providers: SeriousBroker.it  This test is not yet approved or cleared by the [place] FDA and has been authorized for detection and/or diagnosis of SARS-CoV-2 by FDA under an Emergency Use Authorization (EUA). This EUA will remain in effect (meaning this test can be used) for the duration of the COVID-19 declaration under Section 564(b)(1) of the Act, 21 U.S.C. section 360bbb-3(b)(1), unless the authorization is terminated or revoked.  Performed at [facility], [address] [place], [place] [address]   MRSA Next Gen by PCR, Nasal     Status: None   Collection Time: 02/16/23  2:05 AM   Specimen: Nasal Mucosa; Nasal Swab  Result Value Ref Range Status   MRSA by PCR Next Gen NOT DETECTED NOT DETECTED Final    Comment: (NOTE) The GeneXpert MRSA Assay (FDA approved for NASAL specimens only), is one component of a comprehensive MRSA colonization surveillance program. It is not intended to diagnose MRSA infection nor to guide or monitor treatment for MRSA infections. Test performance is not FDA approved in patients less than 50 years old. Performed at [facility], [address] [place], [place] [address]       Radiology Studies: No results found.    Scheduled Meds:  amLODipine  10 mg Oral Daily   atorvastatin  10 mg Oral Daily   azithromycin  500 mg Oral Daily   dapagliflozin propanediol  10 mg Oral QAC breakfast   enoxaparin (LOVENOX) injection  50 mg  Subcutaneous Q24H   folic acid  1 mg Oral Daily   furosemide  40 mg Intravenous BID   insulin aspart  0-15 Units Subcutaneous TID WC   insulin aspart  0-5 Units Subcutaneous QHS   insulin aspart  5 Units Subcutaneous TID WC   insulin glargine-yfgn  20 Units Subcutaneous Daily   LORazepam  0-4 mg Oral Q12H   mometasone-formoterol  2 puff Inhalation BID   multivitamin with minerals  1 tablet Oral Daily   nicotine  21 mg Transdermal Daily   predniSONE  50 mg Oral  Q breakfast   thiamine  100 mg Oral Daily   Or   thiamine  100 mg Intravenous Daily   Continuous Infusions:   LOS: 4 days   Time spent: 25 minutes   Noralee Stain, DO [facility] 02/19/2023, 12:31 PM   Available via Epic secure chat 7am-7pm After these hours, please refer to coverage provider listed on [email or web address]

## 2023-02-19 NOTE — Plan of Care (Signed)

## 2023-02-19 NOTE — Progress Notes (Signed)
Progress Note  Patient Name: Luis [name] Date of Encounter: 02/19/2023  Primary Cardiologist:   None   Subjective   Breathing is better.  No pain.    Inpatient Medications    Scheduled Meds:  amLODipine  10 mg Oral Daily   atorvastatin  10 mg Oral Daily   azithromycin  500 mg Oral Daily   dapagliflozin propanediol  10 mg Oral QAC breakfast   enoxaparin (LOVENOX) injection  50 mg Subcutaneous Q24H   folic acid  1 mg Oral Daily   furosemide  40 mg Intravenous BID   insulin aspart  0-15 Units Subcutaneous TID WC   insulin aspart  0-5 Units Subcutaneous QHS   insulin aspart  5 Units Subcutaneous TID WC   insulin glargine-yfgn  20 Units Subcutaneous Daily   LORazepam  0-4 mg Oral Q12H   mometasone-formoterol  2 puff Inhalation BID   multivitamin with minerals  1 tablet Oral Daily   nicotine  21 mg Transdermal Daily   predniSONE  50 mg Oral Q breakfast   thiamine  100 mg Oral Daily   Or   thiamine  100 mg Intravenous Daily   Continuous Infusions:  PRN Meds: acetaminophen **OR** acetaminophen, guaiFENesin-dextromethorphan, ipratropium-albuterol, loratadine, melatonin, ondansetron **OR** ondansetron (ZOFRAN) IV, phenol, sodium chloride, sorbitol   Vital Signs    Vitals:   02/18/23 2009 02/19/23 0605 02/19/23 0810 02/19/23 0826  BP: (!) 146/92 134/88 (!) 134/93   Pulse: 98 93 100   Resp: 20 19 16    Temp: 98.1 F (36.7 C) 98.1 F (36.7 C)    TempSrc: Oral Oral    SpO2: 98% 100% 100% 100%  Weight:  103.4 kg    Height:        Intake/Output Summary (Last 24 hours) at 02/19/2023 0858 Last data filed at 02/19/2023 0825 Gross per 24 hour  Intake 2437 ml  Output 5515 ml  Net -3078 ml   Filed Weights   02/16/23 0140 02/18/23 0500 02/19/23 0605  Weight: 108.6 kg 105.2 kg 103.4 kg    Telemetry    ST with ventricular pacing - Personally Reviewed  ECG    NA - Personally Reviewed  Physical Exam   GEN: No  acute distress.   Neck: No  JVD Cardiac: RRR, no  murmurs, rubs, or gallops.  Respiratory:    Decreased breath sounds.   GI: Soft, nontender, non-distended, normal bowel sounds  MS:  No edema; No deformity. Neuro:   Nonfocal  Psych: Oriented and appropriate   Labs    Chemistry Recent Labs  Lab 02/15/23 1745 02/16/23 0423 02/17/23 0833 02/18/23 1206 02/19/23 0424  NA 134*   < > 133* 134* 138  K 3.8   < > 4.2 4.6 4.6  CL 95*   < > 97* 97* 98  CO2 22   < > 27 28 29   GLUCOSE 135*   < > 302* 217* 271*  BUN 15   < > 30* 39* 39*  CREATININE 0.83   < > 0.99 1.05 0.97  CALCIUM 9.7   < > 8.9 8.7* 9.1  PROT 7.9  --   --   --   --   ALBUMIN 3.8  --   --   --   --   AST 41  --   --   --   --   ALT 33  --   --   --   --   ALKPHOS 133*  --   --   --   --  BILITOT 0.2*  --   --   --   --   GFRNONAA >60   < > >60 >60 >60  ANIONGAP 17*   < > 9 9 11    < > = values in this interval not displayed.     Hematology Recent Labs  Lab 02/17/23 0833 02/18/23 0427 02/19/23 0424  WBC 13.8* 12.5* 11.3*  RBC 3.26* 3.32* 3.42*  HGB 11.9* 11.7* 12.4*  HCT 35.8* 36.0* 37.7*  MCV 109.8* 108.4* 110.2*  MCH 36.5* 35.2* 36.3*  MCHC 33.2 32.5 32.9  RDW 16.4* 15.9* 15.6*  PLT 194 191 190    Cardiac EnzymesNo results for input(s): "TROPONINI" in the last 168 hours. No results for input(s): "TROPIPOC" in the last 168 hours.   BNP Recent Labs  Lab 02/15/23 1745  BNP 136.7*     DDimer No results for input(s): "DDIMER" in the last 168 hours.   Radiology    No results found.  Cardiac Studies   Echo:    1. Left ventricular ejection fraction, by estimation, is 50 to 55%. The  left ventricle has low normal function. The left ventricle has no regional  wall motion abnormalities. Left ventricular diastolic parameters are  consistent with Grade II diastolic  dysfunction (pseudonormalization).   2. Right ventricular systolic function is normal. The right ventricular  size is normal. Tricuspid regurgitation signal is inadequate for assessing   PA pressure.   3. Left atrial size was mildly dilated.   4. The mitral valve is abnormal. Trivial mitral valve regurgitation. No  evidence of mitral stenosis. Severe mitral annular calcification.   5. The aortic valve was not well visualized. There is moderate  calcification of the aortic valve. Aortic valve regurgitation is not  visualized. No aortic stenosis is present.   6. Aortic dilatation noted. There is borderline dilatation of the  ascending aorta, measuring 39 mm.    Patient Profile     62 y.o. male with a hx of CHB s/p leadless PPM, HTN, HLD, DVT, tobacco use, ETOH, diverticulitis w/ perforation s/p colectomy/ileostomy complicated by wound infections who is being seen 02/17/2023 for the evaluation of CHF at the request of Dr. Alvino Chapel.   Assessment & Plan    Acute hypoxic respiratory failure/HFpEF:    Net negative yesterday was 2.5 liters.  Continue IV diuresis as his creat allows.  He is down to 7 liters.    HTN:  BP remains mildly elevated.  Continue Norvasc.    HLD:    Continue statin.     CHB s/p mirca:  Patient has a leadless pacer that senses the atrial "kick" and actually is synchronized with the AV.  He will benefit from beta blocker for rate control.  I will resume his previous dose of beta blocker.   For questions or updates, please contact [facility] Please consult [email or web address] for contact info under Cardiology/STEMI.   Signed, Rollene Rotunda, MD  02/19/2023, 8:58 AM

## 2023-02-20 DIAGNOSIS — F17200 Nicotine dependence, unspecified, uncomplicated: Secondary | ICD-10-CM

## 2023-02-20 DIAGNOSIS — I10 Essential (primary) hypertension: Secondary | ICD-10-CM | POA: Diagnosis not present

## 2023-02-20 DIAGNOSIS — J441 Chronic obstructive pulmonary disease with (acute) exacerbation: Secondary | ICD-10-CM | POA: Diagnosis not present

## 2023-02-20 DIAGNOSIS — I5033 Acute on chronic diastolic (congestive) heart failure: Secondary | ICD-10-CM | POA: Diagnosis not present

## 2023-02-20 LAB — BASIC METABOLIC PANEL
Anion gap: 9 (ref 5–15)
BUN: 32 mg/dL — ABNORMAL HIGH (ref 8–23)
CO2: 29 mmol/L (ref 22–32)
Calcium: 9.3 mg/dL (ref 8.9–10.3)
Chloride: 100 mmol/L (ref 98–111)
Creatinine, Ser: 0.84 mg/dL (ref 0.61–1.24)
GFR, Estimated: >60 mL/min (ref >60)
Glucose, Bld: 150 mg/dL — ABNORMAL HIGH (ref 70–99)
Potassium: 3.5 mmol/L (ref 3.5–5.1)
Sodium: 138 mmol/L (ref 135–145)

## 2023-02-20 LAB — CBC
HCT: 41.2 % (ref 39.0–52.0)
Hemoglobin: 13.6 g/dL (ref 13.0–17.0)
MCH: 34.7 pg — ABNORMAL HIGH (ref 26.0–34.0)
MCHC: 33 g/dL (ref 30.0–36.0)
MCV: 105.1 fL — ABNORMAL HIGH (ref 80.0–100.0)
Platelets: 213 10*3/uL (ref 150–400)
RBC: 3.92 MIL/uL — ABNORMAL LOW (ref 4.22–5.81)
RDW: 15.3 % (ref 11.5–15.5)
WBC: 10.9 10*3/uL — ABNORMAL HIGH (ref 4.0–10.5)
nRBC: 0 % (ref 0.0–0.2)

## 2023-02-20 LAB — GLUCOSE, CAPILLARY: Glucose-Capillary: 126 mg/dL — ABNORMAL HIGH (ref 70–99)

## 2023-02-20 MED ORDER — TORSEMIDE 20 MG PO TABS: 20.0000 mg | ORAL_TABLET | Freq: Every day | ORAL | 1 refills | Status: DC

## 2023-02-20 MED ORDER — PNEUMOCOCCAL VAC POLYVALENT 25 MCG/0.5ML IJ INJ: 0.5000 mL | INJECTION | INTRAMUSCULAR | Status: DC

## 2023-02-20 MED ORDER — METOPROLOL SUCCINATE ER 50 MG PO TB24
50.0000 mg | ORAL_TABLET | Freq: Every day | ORAL | 1 refills | Status: AC
Start: 2023-02-20 — End: ?

## 2023-02-20 MED ORDER — INFLUENZA VIRUS VACC SPLIT PF (FLUZONE) 0.5 ML IM SUSY: 0.5000 mL | PREFILLED_SYRINGE | Freq: Once | INTRAMUSCULAR | Status: DC

## 2023-02-20 MED ORDER — POTASSIUM CHLORIDE CRYS ER 20 MEQ PO TBCR
40.0000 meq | EXTENDED_RELEASE_TABLET | Freq: Once | ORAL | Status: AC
  Administered 2023-02-20: 40 meq via ORAL
  Filled 2023-02-20: qty 2

## 2023-02-20 MED ORDER — PREDNISONE 10 MG PO TABS: ORAL_TABLET | ORAL | 0 refills | Status: DC

## 2023-02-20 MED ORDER — IPRATROPIUM-ALBUTEROL 0.5-2.5 (3) MG/3ML IN SOLN: 3.0000 mL | Freq: Two times a day (BID) | RESPIRATORY_TRACT | Status: DC

## 2023-02-20 MED ORDER — FUROSEMIDE 10 MG/ML IJ SOLN
80.0000 mg | Freq: Once | INTRAMUSCULAR | Status: AC
  Administered 2023-02-20: 80 mg via INTRAVENOUS
  Filled 2023-02-20: qty 8

## 2023-02-20 NOTE — Progress Notes (Signed)
Progress Note  Patient Name: Luis [name] Date of Encounter: 02/20/2023 Primary Cardiologist: Steffanie Dunn (also his EP)  Subjective   Overnight that he feels much better. Patient notes that he is not usually on O2. He is down to 2 L. No CP, SOB, Palpitations.  Vital Signs    Vitals:   02/20/23 0341 02/20/23 0400 02/20/23 0500 02/20/23 0724  BP: 110/77   (!) 128/93  Pulse: 90   91  Resp:    16  Temp: 98.3 F (36.8 C)   98 F (36.7 C)  TempSrc: Oral   Oral  SpO2: 93% 92%  93%  Weight:   102.9 kg   Height:        Intake/Output Summary (Last 24 hours) at 02/20/2023 0759 Last data filed at 02/20/2023 0729 Gross per 24 hour  Intake 300 ml  Output 4050 ml  Net -3750 ml   Filed Weights   02/18/23 0500 02/19/23 0605 02/20/23 0500  Weight: 105.2 kg 103.4 kg 102.9 kg    Physical Exam   GEN: No acute distress.   Cardiac: RRR, soft systolic murmurs no rubs, or gallops.  Respiratory: Clear to auscultation bilaterally. GI: Soft, nontender, well dressed MS: No edema  Labs   Telemetry: Paced with rare PVCs   Chemistry Recent Labs  Lab 02/15/23 1745 02/16/23 0423 02/18/23 1206 02/19/23 0424 02/20/23 0448  NA 134*   < > 134* 138 138  K 3.8   < > 4.6 4.6 3.5  CL 95*   < > 97* 98 100  CO2 22   < > 28 29 29   GLUCOSE 135*   < > 217* 271* 150*  BUN 15   < > 39* 39* 32*  CREATININE 0.83   < > 1.05 0.97 0.84  CALCIUM 9.7   < > 8.7* 9.1 9.3  PROT 7.9  --   --   --   --   ALBUMIN 3.8  --   --   --   --   AST 41  --   --   --   --   ALT 33  --   --   --   --   ALKPHOS 133*  --   --   --   --   BILITOT 0.2*  --   --   --   --   GFRNONAA >60   < > >60 >60 >60  ANIONGAP 17*   < > 9 11 9    < > = values in this interval not displayed.     Hematology Recent Labs  Lab 02/18/23 0427 02/19/23 0424 02/20/23 0448  WBC 12.5* 11.3* 10.9*  RBC 3.32* 3.42* 3.92*  HGB 11.7* 12.4* 13.6  HCT 36.0* 37.7* 41.2  MCV 108.4* 110.2* 105.1*  MCH 35.2* 36.3* 34.7*  MCHC 32.5  32.9 33.0  RDW 15.9* 15.6* 15.3  PLT 191 190 213    Cardiac EnzymesNo results for input(s): "TROPONINI" in the last 168 hours. No results for input(s): "TROPIPOC" in the last 168 hours.   BNP Recent Labs  Lab 02/15/23 1745  BNP 136.7*     DDimer No results for input(s): "DDIMER" in the last 168 hours.   Cardiac Studies   Cardiac Studies & Procedures       ECHOCARDIOGRAM  ECHOCARDIOGRAM COMPLETE 12/24/2022  Narrative ECHOCARDIOGRAM REPORT    Patient Name:   YEHONATAN MEIXNER Date of Exam: 12/24/2022 Medical Rec #:  [id number]     Height:  73.0 in Accession #:    [id number]    Weight:       252.2 lb Date of Birth:  05/26/1961     BSA:          2.375 m Patient Age:    62 years      BP:           123/76 mmHg Patient Gender: M             HR:           92 bpm. Exam Location:  Inpatient  Procedure: 2D Echo, Cardiac Doppler, Color Doppler and Intracardiac Opacification Agent  Indications:    CHF- Acute Diastolic  History:        Patient has prior history of Echocardiogram examinations, most recent 07/27/2022. Risk Factors:Diabetes, Dyslipidemia, Current Smoker and Hypertension.  Sonographer:    Raeford Razor Referring Phys: [id number] [facility] General   Sonographer Comments: Patient is obese, echo performed with patient supine and on artificial respirator and no subcostal window. Image acquisition challenging due to patient body habitus and Image acquisition challenging due to respiratory motion. IMPRESSIONS   1. Left ventricular ejection fraction, by estimation, is 50 to 55%. The left ventricle has low normal function. The left ventricle has no regional wall motion abnormalities. Left ventricular diastolic parameters are consistent with Grade II diastolic dysfunction (pseudonormalization). 2. Right ventricular systolic function is normal. The right ventricular size is normal. Tricuspid regurgitation signal is inadequate for assessing PA pressure. 3. Left atrial size was mildly  dilated. 4. The mitral valve is abnormal. Trivial mitral valve regurgitation. No evidence of mitral stenosis. Severe mitral annular calcification. 5. The aortic valve was not well visualized. There is moderate calcification of the aortic valve. Aortic valve regurgitation is not visualized. No aortic stenosis is present. 6. Aortic dilatation noted. There is borderline dilatation of the ascending aorta, measuring 39 mm.  Comparison(s): No significant change from prior study.  FINDINGS Left Ventricle: Left ventricular ejection fraction, by estimation, is 50 to 55%. The left ventricle has low normal function. The left ventricle has no regional wall motion abnormalities. Definity contrast agent was given IV to delineate the left ventricular endocardial borders. The left ventricular internal cavity size was normal in size. There is no left ventricular hypertrophy. Left ventricular diastolic parameters are consistent with Grade II diastolic dysfunction (pseudonormalization).  Right Ventricle: The right ventricular size is normal. No increase in right ventricular wall thickness. Right ventricular systolic function is normal. Tricuspid regurgitation signal is inadequate for assessing PA pressure.  Left Atrium: Left atrial size was mildly dilated.  Right Atrium: Right atrial size was normal in size.  Pericardium: There is no evidence of pericardial effusion.  Mitral Valve: The mitral valve is abnormal. Severe mitral annular calcification. Trivial mitral valve regurgitation. No evidence of mitral valve stenosis.  Tricuspid Valve: The tricuspid valve is not well visualized. Tricuspid valve regurgitation is not demonstrated. No evidence of tricuspid stenosis.  Aortic Valve: The aortic valve was not well visualized. There is moderate calcification of the aortic valve. Aortic valve regurgitation is not visualized. No aortic stenosis is present. Aortic valve peak gradient measures 12.7 mmHg.  Pulmonic  Valve: The pulmonic valve was not well visualized. Pulmonic valve regurgitation is not visualized. No evidence of pulmonic stenosis.  Aorta: The aortic root was not well visualized and aortic dilatation noted. There is borderline dilatation of the ascending aorta, measuring 39 mm.  Venous: The inferior vena cava was not well visualized.  IAS/Shunts: No atrial level shunt detected by color flow Doppler.   LEFT VENTRICLE PLAX 2D LVIDd:         5.50 cm      Diastology LVIDs:         3.90 cm      LV e' medial:    3.81 cm/s LV PW:         1.30 cm      LV E/e' medial:  42.5 LV IVS:        1.30 cm      LV e' lateral:   18.40 cm/s LVOT diam:     2.00 cm      LV E/e' lateral: 8.8 LV SV:         63 LV SV Index:   26 LVOT Area:     3.14 cm  LV Volumes (MOD) LV vol d, MOD A4C: 168.0 ml LV vol s, MOD A4C: 73.8 ml LV SV MOD A4C:     168.0 ml  RIGHT VENTRICLE             IVC RV Basal diam:  3.50 cm     IVC diam: 2.70 cm RV Mid diam:    3.70 cm RV S prime:     13.60 cm/s TAPSE (M-mode): 1.7 cm  LEFT ATRIUM             Index        RIGHT ATRIUM           Index LA diam:        4.60 cm 1.94 cm/m   RA Area:     16.50 cm LA Vol (A2C):   89.1 ml 37.52 ml/m  RA Volume:   37.30 ml  15.71 ml/m LA Vol (A4C):   85.7 ml 36.09 ml/m LA Biplane Vol: 86.6 ml 36.47 ml/m AORTIC VALVE AV Area (Vmax): 2.22 cm AV Vmax:        178.00 cm/s AV Peak Grad:   12.7 mmHg LVOT Vmax:      126.00 cm/s LVOT Vmean:     87.600 cm/s LVOT VTI:       0.199 m  AORTA Ao Root diam: 3.40 cm Ao Asc diam:  3.90 cm  MITRAL VALVE MV Area (PHT): 4.15 cm     SHUNTS MV Decel Time: 183 msec     Systemic VTI:  0.20 m MV E velocity: 162.00 cm/s  Systemic Diam: 2.00 cm MV A velocity: 81.70 cm/s MV E/A ratio:  1.98  Vishnu Priya Mallipeddi Electronically signed by Winfield Rast Mallipeddi Signature Date/Time: 12/25/2022/8:06:50 AM    Final      CARDIAC MRI  MR CARDIAC MORPHOLOGY W WO CONTRAST  12/28/2020  Narrative CLINICAL DATA:  Complete heart block  EXAM: CARDIAC MRI  TECHNIQUE: The patient was scanned on a 1.5 Tesla GE magnet. A dedicated cardiac coil was used. Functional imaging was done using Fiesta sequences. 2,3, and 4 chamber views were done to assess for RWMA's. Modified Simpson's rule using a short axis stack was used to calculate an ejection fraction on a dedicated work [employer]. The patient received 8 cc of Gadavist. After 10 minutes inversion recovery sequences were used to assess for infiltration and scar tissue.  FINDINGS: Bilateral moderate pleural effusions were present. Airspace disease left > right base.  Trivial pericardial effusion. Normal left ventricular size with mild LV hypertrophy. Normal wall motion with LV EF 72%. Normal right ventricular size and systolic function, EF 55%. Mild left  and right atrial enlargement. Mitral regurgitation is likely mild. The aortic valve was calcified and trileaflet. No aortic regurgitation and probably no significant stenosis.  On delayed enhancement images, there was mid-wall basal anteroseptal late gadolinium enhancement (LGE) and mid-wall basal inferolateral LGE, relatively small area appears to be involved.  Measurements:  LVEDV 227 mL  LVSV 163 mL LVEF 72%  RVEDV 238 mL RVSV 130 mL RVEF 55%  ECV 27% in septum  IMPRESSION: 1.  Normal LV size with mild LV hypertrophy, EF 72%.  2.  Normal RV size with EF 55%.  3.  Normal ECV percentage is not suggestive of cardiac amyloidosis.  4. LGE noted in the mid-wall basal anteroseptum and mid-wall basal inferolateral wall. This is not a coronary disease pattern, could be consistent with cardiac sarcoidosis in the appropriate clinical situation, also prior myocarditis. Would suggest high resolution CT chest to look for any evidence for pulmonary sarcoidosis.  Dalton Mclean   Electronically Signed By: Marca Ancona M.D. On:  12/28/2020 15:57              Assessment & Plan   Acute hypoxic respiratory failure Acute on chronic HFpEF exacerbation - complicated by COPD exacerbation - patient is nearly weaned off O2 and was told by primary that he is leaving today - will give 80 IV X1; his nurse is working to wean off his O2 today - he can return to torsemide 20 mg PO daily at discharge - continue Farixga -Strict I's and O's, daily weight, fluid restriction 2L   Hypertension with DM -Norvasc   Hyperlipidemia -Lipitor   Tobacco abuse History of alcohol abuse - cessation encouraged; he is planning to stop at discharge   Rest as per primary  Has 02/23/2023 follow up appointment  For questions or updates, please contact [facility] Please consult [email or web address] for contact info under Cardiology/STEMI.      Riley Lam, MD FASE [facility] Cardiologist [facility]  [address] [place], [address] [place], [place] [address] [phone]  7:59 AM

## 2023-02-20 NOTE — Progress Notes (Signed)
SATURATION QUALIFICATIONS: (This note is used to comply with regulatory documentation for home oxygen)  Patient Saturations on Room Air at Rest = 98%  Patient Saturations on Room Air while Ambulating = 92%  Patient Saturations on 0 Liters of oxygen while Ambulating = 95%  Please briefly explain why patient needs home oxygen:

## 2023-02-20 NOTE — Discharge Summary (Addendum)
Physician Discharge Summary  Giuseppe Omo UJW:[id number] DOB: 07-27-1960 DOA: 02/15/2023  PCP: [name], NP  Admit date: 02/15/2023 Discharge date: 02/20/2023  Admitted From: Home  Disposition:  Home  Recommendations for Outpatient Follow-up:  Follow up with PCP, cardiology  Discharge Condition: Stable CODE STATUS: Full code Diet recommendation: Heart healthy diet  Brief/Interim Summary: Luis [name] is a 62 y.o. male with medical history significant for diastolic dysfunction with a normal ejection fraction, type 2 diabetes mellitus, history of pacemaker, alcohol abuse, ostomy and chronic abdominal wound who presents with severe cough and shortness of breath which is has worsened over the course of the last 2 to 3 weeks.  The patient he cannot breathe when he leans back.  He has gained at least 8 pounds in the last 3 weeks.  He has a severe cough and he cannot catch his breath at all.  He picked up a new nebulizer machine but that has not helped.  He denies any fevers or chills or chest pain.  The patient was hospitalized 2 months ago for very similar symptoms but at that time he had progressive hypoxia and ended up intubated.  Patient says he has no recollection of his time in the ICU.  His initial diagnosis was congestive heart failure but his discharge diagnosis was pneumonia.    In the emergency department the patient wa found to have O2 sats of 88% on room air.  His chest x-ray was consistent with congestive heart failure so the patient was treated with IV Lasix.  Cardiology was consulted.  Patient required up to 15 L high flow oxygen during hospital stay.  He diuresed well on IV Lasix.  His oxygen needs continue to improve. He was discharged home on his home torsemide.  Discharge Diagnoses:   Principal Problem:   Acute exacerbation of CHF (congestive heart failure) (HCC) Active Problems:   Acute on chronic heart failure with preserved ejection fraction (HFpEF) (HCC)   Essential  hypertension   Type 2 diabetes mellitus with hyperlipidemia (HCC)   Tobacco dependence   COPD with acute exacerbation (HCC)   HLD (hyperlipidemia)   Demand ischemia    Acute hypoxic respiratory failure -Secondary to combined CHF exacerbation, COPD exacerbation -COVID, influenza negative, MRSA PCR negative -Chest x-ray showing hazy airspace interstitial opacities greater in the lower lungs, small left greater than right pleural effusion, compatible with CHF -Currently requiring 2 L HF nasal cannula O2.  Home oxygen desat screening prior to discharge   Acute on chronic diastolic CHF exacerbation -Echocardiogram 12/24/2022 revealed EF 50 to 55%, grade 2 diastolic dysfunction -Patient takes Demadex at home -Continue Lasix IV BID, Farxiga --> torsemide for discharge -Strict I's and O's, daily weight, fluid restriction -Cardiology following   COPD exacerbation -Prednisone taper, completed azithromycin, breathing treatments   Diabetes mellitus with hyperglycemia -A1c 7.0.  On Farxiga, metformin at home -Resume home meds   Hypertension -Norvasc   Hyperlipidemia -Lipitor   Demand ischemia -Troponin 33, 31   History of alcohol abuse -CIWA protocol.  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  Followed by wound clinic as well as general surgery at [facility]    Discharge Instructions  Discharge Instructions     (HEART FAILURE PATIENTS) Call MD:  Anytime you have any of the following symptoms: 1) 3 pound weight gain  in 24 hours or 5 pounds in 1 week 2) shortness of breath, with or without a dry hacking cough 3) swelling in the hands, feet or stomach 4) if you have to sleep on extra pillows at night in order to breathe.   Complete by: As directed    Call MD for:   difficulty breathing, headache or visual disturbances   Complete by: As directed    Call MD for:  extreme fatigue   Complete by: As directed    Call MD for:  persistant dizziness or light-headedness   Complete by: As directed    Call MD for:  persistant nausea and vomiting   Complete by: As directed    Call MD for:  severe uncontrolled pain   Complete by: As directed    Call MD for:  temperature >100.4   Complete by: As directed    Diet - low sodium heart healthy   Complete by: As directed    Diet Carb Modified   Complete by: As directed    Discharge instructions   Complete by: As directed    You were cared for by a hospitalist during your hospital stay. If you have any questions about your discharge medications or the care you received while you were in the hospital after you are discharged, you can call the unit and ask to speak with the hospitalist on call if the hospitalist that took care of you is not available. Once you are discharged, your primary care physician will handle any further medical issues. Please note that NO REFILLS for any discharge medications will be authorized once you are discharged, as it is imperative that you return to your primary care physician (or establish a relationship with a primary care physician if you do not have one) for your aftercare needs so that they can reassess your need for medications and monitor your lab values.   Discharge wound care:   Complete by: As directed    Resume previous wound care per outpatient wound care clinic   Increase activity slowly   Complete by: As directed       Allergies as of 02/20/2023       Reactions   Lisinopril    Other reaction(s): renal effects   Codeine Other (See Comments), Hives   unknown Other reaction(s): Other (See Comments) unknown unknown        Medication List     STOP taking these medications    famotidine 20 MG tablet Commonly known as: PEPCID       TAKE these medications     acetaminophen 650 MG CR tablet Commonly known as: TYLENOL Take 1,300 mg by mouth every 8 (eight) hours as needed for pain.   amLODipine 10 MG tablet Commonly known as: NORVASC Take 1 tablet (10 mg total) by mouth daily.   ascorbic acid 500 MG tablet Commonly known as: VITAMIN C Take 1 tablet (500 mg total) by mouth daily.   atorvastatin 10 MG tablet Commonly known as: LIPITOR Take 10 mg by mouth daily.   cetirizine 10 MG tablet Commonly known as: ZYRTEC Take 10 mg by mouth daily.   dapagliflozin propanediol 10 MG Tabs tablet Commonly known as: Farxiga Take 1 tablet (10 mg total) by mouth daily before breakfast.   fluticasone 110 MCG/ACT inhaler Commonly known as: FLOVENT HFA Inhale 2 puffs into the lungs 2 (two) times daily.   folic acid 1 MG tablet Commonly known as: FOLVITE TAKE 1 TABLET(1 MG) BY MOUTH DAILY  ipratropium-albuterol 0.5-2.5 (3) MG/3ML Soln Commonly known as: DUONEB Take 3 mLs by nebulization every 6 (six) hours as needed.   metFORMIN 500 MG tablet Commonly known as: GLUCOPHAGE Take 2 tablets by mouth with breakfast and 1 tablet by mouth at dinner   metoprolol succinate 50 MG 24 hr tablet Commonly known as: TOPROL-XL Take 1 tablet (50 mg total) by mouth daily. Take with or immediately following a meal.   multivitamin with minerals Tabs tablet Take 1 tablet by mouth every other day.   predniSONE 10 MG tablet Commonly known as: DELTASONE Take 4 tabs for 3 days, then 3 tabs for 3 days, then 2 tabs for 3 days, then 1 tab for 3 days, then 1/2 tab for 4 days.   thiamine 100 MG tablet Commonly known as: VITAMIN B1 Take 1 tablet (100 mg total) by mouth daily.   torsemide 20 MG tablet Commonly known as: DEMADEX Take 1 tablet (20 mg total) by mouth daily.               Discharge Care Instructions  (From admission, onward)           Start     Ordered   02/20/23 0000  Discharge wound care:       Comments: Resume previous wound care  per outpatient wound care clinic   02/20/23 0912            Follow-up Information     [name], NP Follow up.   Specialty: Nurse Practitioner Contact information: [address] [place] [address] [phone]         [name], MD Follow up.   Specialties: Cardiology, Radiology Contact information: [address] [place] [address] [phone]         [facility] [address] Office Follow up.   Specialty: Cardiology Contact information: [address] [place] [address] [phone]               Allergies  Allergen Reactions   Lisinopril     Other reaction(s): renal effects   Codeine Other (See Comments) and Hives    unknown Other reaction(s): Other (See Comments) unknown unknown    Consultations: Cardiology   Procedures/Studies: DG CHEST PORT 1 VIEW  Result Date: 02/17/2023 CLINICAL DATA:  Respiratory distress. EXAM: PORTABLE CHEST 1 VIEW COMPARISON:  Two days ago FINDINGS: Chronic cardiomegaly. Implantable cardiac device. Diffuse interstitial opacity with cephalized blood flow. Small pleural effusions are possible. IMPRESSION: CHF pattern. Electronically Signed   By: Tiburcio Pea M.D.   On: 02/17/2023 07:06   DG Chest Port 1 View  Result Date: 02/15/2023 CLINICAL DATA:  Shortness of breath and cough EXAM: PORTABLE CHEST 1 VIEW COMPARISON:  Radiograph 12/28/2022 FINDINGS: Stable cardiomegaly. Aortic atherosclerotic calcification. Leadless pacer. Hazy airspace and interstitial opacities greatest in the lower lungs. Pulmonary vascular congestion. Small left-greater-than-right pleural effusions. No pneumothorax. IMPRESSION: Findings compatible with CHF similar to 12/28/2022. Electronically Signed   By: Minerva Fester M.D.   On: 02/15/2023 20:08      Discharge Exam: Vitals:   02/20/23 0400 02/20/23 0724  BP:  (!) 128/93  Pulse:  91  Resp:  16  Temp:  98 F (36.7 C)  SpO2:  92% 93%    General: Pt is alert, awake, not in acute distress Cardiovascular: RRR, S1/S2 +, no edema Respiratory: CTA bilaterally, no wheezing, no rhonchi, no respiratory distress, no conversational dyspnea, on nasal cannula O2  Abdominal: Soft, NT, ND, bowel sounds +, ileostomy in place, dressing over his chronic wound Extremities: no edema, no cyanosis Psych: Normal mood and affect, stable judgement and insight     The results of significant diagnostics from this hospitalization (including imaging, microbiology, ancillary and laboratory) are listed below for reference.     Microbiology: Recent Results (from the past 240 hour(s))  Resp panel by RT-PCR (RSV, Flu A&B, Covid) Anterior Nasal Swab     Status: None   Collection Time: 02/15/23  5:52 PM   Specimen: Anterior Nasal Swab  Result Value Ref Range Status   SARS Coronavirus 2 by RT PCR NEGATIVE NEGATIVE Final   Influenza A by PCR NEGATIVE NEGATIVE Final   Influenza B by PCR NEGATIVE NEGATIVE Final    Comment: (NOTE) The Xpert Xpress SARS-CoV-2/FLU/RSV plus assay is intended as an aid in the diagnosis of influenza from Nasopharyngeal swab specimens and should not be used as a sole basis for treatment. Nasal washings and aspirates are unacceptable for Xpert Xpress SARS-CoV-2/FLU/RSV testing.  Fact Sheet for Patients: BloggerCourse.com  Fact Sheet for Healthcare Providers: SeriousBroker.it  This test is not yet approved or cleared by the [place] FDA and has been authorized for detection and/or diagnosis of SARS-CoV-2 by FDA under an Emergency Use Authorization (EUA). This EUA will remain in effect (meaning this test can be used) for the duration of the COVID-19 declaration under Section 564(b)(1) of the Act, 21 U.S.C. section 360bbb-3(b)(1), unless the authorization is terminated or revoked.     Resp Syncytial Virus by PCR NEGATIVE NEGATIVE Final    Comment:  (NOTE) Fact Sheet for Patients: BloggerCourse.com  Fact Sheet for Healthcare Providers: SeriousBroker.it  This test is not yet approved or cleared by the [place] FDA and has been authorized for detection and/or diagnosis of SARS-CoV-2 by FDA under an Emergency Use Authorization (EUA). This EUA will remain in effect (meaning this test can be used) for the duration of the COVID-19 declaration under Section 564(b)(1) of the Act, 21 U.S.C. section 360bbb-3(b)(1), unless the authorization is terminated or revoked.  Performed at [facility], [address] [place], [place] [address]   MRSA Next Gen by PCR, Nasal     Status: None   Collection Time: 02/16/23  2:05 AM   Specimen: Nasal Mucosa; Nasal Swab  Result Value Ref Range Status   MRSA by PCR Next Gen NOT DETECTED NOT DETECTED Final    Comment: (NOTE) The GeneXpert MRSA Assay (FDA approved for NASAL specimens only), is one component of a comprehensive MRSA colonization surveillance program. It is not intended to diagnose MRSA infection nor to guide or monitor treatment for MRSA infections. Test performance is not FDA approved in patients less than 60 years old. Performed at [facility], [address], [place], [place] [address]      Labs: BNP (last 3 results) Recent Labs    12/23/22 1325 12/25/22 1524 02/15/23 1745  BNP 293.0* 1,428.6* 136.7*   Basic Metabolic Panel: Recent Labs  Lab 02/16/23 0423 02/17/23 0833 02/18/23 1206 02/19/23 0424 02/20/23 0448  NA 133* 133* 134* 138 138  K 4.0 4.2 4.6 4.6 3.5  CL 95* 97* 97* 98 100  CO2 25 27 28 29 29   GLUCOSE 235* 302* 217* 271* 150*  BUN 16 30* 39* 39* 32*  CREATININE 1.03 0.99 1.05 0.97 0.84  CALCIUM 9.3 8.9 8.7* 9.1 9.3  MG 1.7  --   --   --   --  Liver Function Tests: Recent Labs  Lab 02/15/23 1745  AST 41  ALT 33  ALKPHOS 133*  BILITOT 0.2*  PROT 7.9  ALBUMIN 3.8   No results  for input(s): "LIPASE", "AMYLASE" in the last 168 hours. No results for input(s): "AMMONIA" in the last 168 hours. CBC: Recent Labs  Lab 02/15/23 1745 02/16/23 0423 02/17/23 0833 02/18/23 0427 02/19/23 0424 02/20/23 0448  WBC 9.4 11.1* 13.8* 12.5* 11.3* 10.9*  NEUTROABS 6.6  --   --   --   --   --   HGB 11.9* 12.0* 11.9* 11.7* 12.4* 13.6  HCT 35.9* 35.9* 35.8* 36.0* 37.7* 41.2  MCV 105.9* 105.6* 109.8* 108.4* 110.2* 105.1*  PLT 209 192 194 191 190 213   Cardiac Enzymes: No results for input(s): "CKTOTAL", "CKMB", "CKMBINDEX", "TROPONINI" in the last 168 hours. BNP: Invalid input(s): "POCBNP" CBG: Recent Labs  Lab 02/19/23 0748 02/19/23 1139 02/19/23 1540 02/19/23 2049 02/20/23 0722  GLUCAP 247* 177* 305* 110* 126*   D-Dimer No results for input(s): "DDIMER" in the last 72 hours. Hgb A1c No results for input(s): "HGBA1C" in the last 72 hours. Lipid Profile No results for input(s): "CHOL", "HDL", "LDLCALC", "TRIG", "CHOLHDL", "LDLDIRECT" in the last 72 hours. Thyroid function studies No results for input(s): "TSH", "T4TOTAL", "T3FREE", "THYROIDAB" in the last 72 hours.  Invalid input(s): "FREET3" Anemia work up No results for input(s): "VITAMINB12", "FOLATE", "FERRITIN", "TIBC", "IRON", "RETICCTPCT" in the last 72 hours. Urinalysis    Component Value Date/Time   COLORURINE YELLOW 08/02/2019 2009   APPEARANCEUR CLEAR 08/02/2019 2009   LABSPEC 1.024 08/02/2019 2009   PHURINE 5.0 08/02/2019 2009   GLUCOSEU NEGATIVE 08/02/2019 2009   HGBUR NEGATIVE 08/02/2019 2009   BILIRUBINUR NEGATIVE 08/02/2019 2009   KETONESUR NEGATIVE 08/02/2019 2009   PROTEINUR NEGATIVE 08/02/2019 2009   NITRITE NEGATIVE 08/02/2019 2009   LEUKOCYTESUR NEGATIVE 08/02/2019 2009   Sepsis Labs Recent Labs  Lab 02/17/23 5284 02/18/23 0427 02/19/23 0424 02/20/23 0448  WBC 13.8* 12.5* 11.3* 10.9*   Microbiology Recent Results (from the past 240 hour(s))  Resp panel by RT-PCR (RSV, Flu  A&B, Covid) Anterior Nasal Swab     Status: None   Collection Time: 02/15/23  5:52 PM   Specimen: Anterior Nasal Swab  Result Value Ref Range Status   SARS Coronavirus 2 by RT PCR NEGATIVE NEGATIVE Final   Influenza A by PCR NEGATIVE NEGATIVE Final   Influenza B by PCR NEGATIVE NEGATIVE Final    Comment: (NOTE) The Xpert Xpress SARS-CoV-2/FLU/RSV plus assay is intended as an aid in the diagnosis of influenza from Nasopharyngeal swab specimens and should not be used as a sole basis for treatment. Nasal washings and aspirates are unacceptable for Xpert Xpress SARS-CoV-2/FLU/RSV testing.  Fact Sheet for Patients: BloggerCourse.com  Fact Sheet for Healthcare Providers: SeriousBroker.it  This test is not yet approved or cleared by the [place] FDA and has been authorized for detection and/or diagnosis of SARS-CoV-2 by FDA under an Emergency Use Authorization (EUA). This EUA will remain in effect (meaning this test can be used) for the duration of the COVID-19 declaration under Section 564(b)(1) of the Act, 21 U.S.C. section 360bbb-3(b)(1), unless the authorization is terminated or revoked.     Resp Syncytial Virus by PCR NEGATIVE NEGATIVE Final    Comment: (NOTE) Fact Sheet for Patients: BloggerCourse.com  Fact Sheet for Healthcare Providers: SeriousBroker.it  This test is not yet approved or cleared by the [place] FDA and has been authorized for detection and/or  diagnosis of SARS-CoV-2 by FDA under an Emergency Use Authorization (EUA). This EUA will remain in effect (meaning this test can be used) for the duration of the COVID-19 declaration under Section 564(b)(1) of the Act, 21 U.S.C. section 360bbb-3(b)(1), unless the authorization is terminated or revoked.  Performed at [facility], [address] [place], [place] [address]   MRSA Next Gen by PCR, Nasal      Status: None   Collection Time: 02/16/23  2:05 AM   Specimen: Nasal Mucosa; Nasal Swab  Result Value Ref Range Status   MRSA by PCR Next Gen NOT DETECTED NOT DETECTED Final    Comment: (NOTE) The GeneXpert MRSA Assay (FDA approved for NASAL specimens only), is one component of a comprehensive MRSA colonization surveillance program. It is not intended to diagnose MRSA infection nor to guide or monitor treatment for MRSA infections. Test performance is not FDA approved in patients less than 26 years old. Performed at [facility], [address], [place], [place] [address]      Patient was seen and examined on the day of discharge and was found to be in stable condition. Time coordinating discharge: 35 minutes including assessment and coordination of care, as well as examination of the patient.  I attempted to call sister at her request twice, no answer.  SIGNED:  Noralee Stain, DO [facility] 02/20/2023, 9:13 AM

## 2023-02-20 NOTE — Plan of Care (Signed)
  Problem: Education: Goal: Ability to describe self-care measures that may prevent or decrease complications (Diabetes Survival Skills Education) will improve Outcome: Adequate for Discharge Goal: Individualized Educational Video(s) Outcome: Adequate for Discharge   Problem: Coping: Goal: Ability to adjust to condition or change in health will improve Outcome: Adequate for Discharge   Problem: Fluid Volume: Goal: Ability to maintain a balanced intake and output will improve Outcome: Adequate for Discharge   Problem: Health Behavior/Discharge Planning: Goal: Ability to identify and utilize available resources and services will improve Outcome: Adequate for Discharge Goal: Ability to manage health-related needs will improve Outcome: Adequate for Discharge   Problem: Metabolic: Goal: Ability to maintain appropriate glucose levels will improve Outcome: Adequate for Discharge   Problem: Nutritional: Goal: Maintenance of adequate nutrition will improve Outcome: Adequate for Discharge Goal: Progress toward achieving an optimal weight will improve Outcome: Adequate for Discharge   Problem: Skin Integrity: Goal: Risk for impaired skin integrity will decrease Outcome: Adequate for Discharge   Problem: Tissue Perfusion: Goal: Adequacy of tissue perfusion will improve Outcome: Adequate for Discharge   Problem: Education: Goal: Knowledge of General Education information will improve Description: Including pain rating scale, medication(s)/side effects and non-pharmacologic comfort measures Outcome: Adequate for Discharge   Problem: Health Behavior/Discharge Planning: Goal: Ability to manage health-related needs will improve Outcome: Adequate for Discharge   Problem: Clinical Measurements: Goal: Ability to maintain clinical measurements within normal limits will improve Outcome: Adequate for Discharge Goal: Will remain free from infection Outcome: Adequate for Discharge Goal:  Diagnostic test results will improve Outcome: Adequate for Discharge Goal: Respiratory complications will improve Outcome: Adequate for Discharge Goal: Cardiovascular complication will be avoided Outcome: Adequate for Discharge   Problem: Activity: Goal: Risk for activity intolerance will decrease Outcome: Adequate for Discharge   Problem: Nutrition: Goal: Adequate nutrition will be maintained Outcome: Adequate for Discharge   Problem: Coping: Goal: Level of anxiety will decrease Outcome: Adequate for Discharge   Problem: Elimination: Goal: Will not experience complications related to bowel motility Outcome: Adequate for Discharge Goal: Will not experience complications related to urinary retention Outcome: Adequate for Discharge   Problem: Pain Managment: Goal: General experience of comfort will improve Outcome: Adequate for Discharge   Problem: Safety: Goal: Ability to remain free from injury will improve Outcome: Adequate for Discharge   Problem: Skin Integrity: Goal: Risk for impaired skin integrity will decrease Outcome: Adequate for Discharge   Problem: Activity: Goal: Ability to tolerate increased activity will improve Outcome: Adequate for Discharge   Problem: Respiratory: Goal: Ability to maintain a clear airway and adequate ventilation will improve Outcome: Adequate for Discharge   Problem: Role Relationship: Goal: Method of communication will improve Outcome: Adequate for Discharge

## 2023-02-22 ENCOUNTER — Telehealth (HOSPITAL_COMMUNITY): Payer: Self-pay

## 2023-02-22 ENCOUNTER — Telehealth: Payer: Self-pay | Admitting: Cardiology

## 2023-02-22 ENCOUNTER — Other Ambulatory Visit (HOSPITAL_COMMUNITY): Payer: Self-pay

## 2023-02-22 MED ORDER — PREDNISONE 10 MG PO TABS: ORAL_TABLET | ORAL | 0 refills | Status: DC

## 2023-02-22 NOTE — Telephone Encounter (Signed)
Pt c/o medication issue:  1. Name of Medication: predniSONE (DELTASONE) 10 MG tablet   2. How are you currently taking this medication (dosage and times per day)? Not currently taking  3. Are you having a reaction (difficulty breathing--STAT)? No   4. What is your medication issue? Patient is calling unsure of what to do due to his pharmacy not having this prescription that was prescribed by the hospital. I called the pharmacy prescription was sent to while patient was on the line and confirmed prescription was never received. Patient states he spoke with his PCP in  regards to this as well and they did not offer assistance to have prescription resent. Please advise.

## 2023-02-22 NOTE — Progress Notes (Unsigned)
Cardiology Office Note Date:  02/22/2023  Patient ID:  Luis [name], DOB 1961-01-07, MRN [id number] PCP:  [name], NP  Electrophysiologist: Dr. Lalla Brothers    Chief Complaint:  post hospital  History of Present Illness: Luis [name] is a 62 y.o. male with history of HTN, HLD, diverticulitis (w/perforation s/p colectomy and ileostomy complicated by wound infections for the past 2 years), CHB w/PPM, HFpEF  He was hospitalized 12/2020 with SOB found in CHB, unfortunately he had been struggling with his abdominal wounds for 2 years. Reported Changes his wounds at home but remained with ongoing drainage of foul smelling purulence, frequent fevers/chills.  Echo 12/26/2020 LVEF 65-70% cMRI 12/28/2020 i.  Normal LV size with mild LV hypertrophy, EF 72%. ii.  Normal RV size with EF 55%.  iii.  Normal ECV percentage is not suggestive of cardiac amyloidosis. iv. LGE noted in the mid-wall basal anteroseptum and mid-wall basal inferolateral wall. This is not a coronary disease pattern, could be consistent with cardiac sarcoidosis in the appropriate clinical situation, also prior myocarditis. Would suggest high resolution CT chest to look for any evidence for pulmonary sarcoidosis. 3. High Res Chest CT 12/29/2020 without evidence of ILD (See result note for full results) He needed pacing support prior to any intervention for his abdominal wound and ultimately underwent Micra AV implant There was discussion though that with sarcoid, there may be need for ICD in the future Recommended out patient PET Discharged 01/01/21  He saw [name], PA-C 03/01/21, was feeling better, reported progress in his wound though not healed, following at [facility], not planned for surgery. He had some ectopy that could have been preventing optimal AS-VP. His Toprol increased.  Troubleshooting was done with industry support  I saw him Dec 2022 The patient tells me that since the pacemaker his breathing immediately was better  and remains improved Past that he feels awful. When asked if he has had any CP or concerns/symptoms he thinks may be his heart he gets very defensive even , say "look at me, I feel terrible, wouldn't you?!" He reports that he has been flayed open and ever since has never felt well again He denies syncope, says a month ago he fell and did not have the strength to  get up, he doesn't know whay Eventually got himself up.  Symptoms/concerns are very difficult to weed out today, he reports he feels terrible, always does and is not new.  He is very emotional about the ongoing wound and inability to heal.  He reports that he has had to cancel a number of appointments with the surgeon as he has ours because he just doesn't feel well and cancels. He has an appt with Dr. [name] (he thinks is the name) surgery next month.He is not actively getting formal wound care, he changes the dressings when needed.  Says his son is very supportive and helpful but live a ways away and is hard for him to be there all of the time He admits to smoking and drinking heavily.  When asked how much he drinks he saw "a lot", is not more specific.  He is not interested in quitting either currently. Pacer functioning appropriately 80% AM/VP EKG was abnormal with marked T changes, unclear symptoms, and discussed with DOD, planned for stress test and labs/lytes He was urged to reduce at least amount he was drinking/smoking, quitting was not on the table and re-establish with his wound care doctors  He did not get the stress  test done  He called 09/30/21, sent a remote with symptoms of waking with the feeling of suffocating his assessment perhaps allergies or anxiety, device transmission noted intact device function, device RN urged he keep his follow up as scheduled with Dr. Lalla Brothers.  ER visit 10/02/21 with bleeding abdominal wound, apparently started after he pulled off the scab, notes report strong odor of alcohol by triage  provider, ultimately left without formal evaluation  He canceled his visit with Dr. Lalla Brothers in May, and no showed to the next. He has cancelled and no showed to a number of scheduled visits with the wound/ostomy clinic as well.  He saw the wound clinic 11/23/21  I saw him 11/19/21 He reports last week on his way here suddenly started to feel poorly, chills, felt ill. Spent the day in bed. The next day he developed blood in his urine thinks he passed a kidney stone and then felt better and has since then. No CP, some SOB that is chronic No fainting or near fainting Of course belly never feels well Seeing wound clinic, says the person he saw last was wonderful and is encouraged that she will get him where he needs to be in hopes of a colostomy reversal Travel to [facility] for PET, MRIs is not an option for him Planned to revisit imaging once [facility] program was up/running Urged to minimize/quit ETOH and smoking  He saw Dr Lalla Brothers 05/25/22, not sleep well, had gained 40lbs, planned for an echo, suspected his fatigue multifactorial   Hospitalized 12/23/22-01/01/23 PNA, acute/chronic CHF (diastolic) Patient was managed with antibiotics, BiPAP due to increasing oxygen requirements, eventually intubated and transferred to ICU on 12/25/2018 for further management of respiratory failure. Hospital stay was also complicated by acute gout that was managed with colchicine and prednisone  Delirium/ETOH withdrawal Wound managed with ostomy/wound team  Hospitalized 02/15/23 - 02/20/23 Progressive SOB, acute CHF COPD exacerbation required up to 15 L high flow oxygen during hospital stay. He diuresed well on IV Lasix >> home back on his torsemide Prednisone taper Wound care followed   TODAY He feels much better in comparison to pre-hospital, very concerned about the fluid build up and HF diagnosis. He reports that he feels good enough that he would like to get to walking, or some exercise No CP, palpitations Has  some SOB, none at rest, minimal at exertional Intermittent cough No near syncope or syncope  He reports a very good appetite, no real dietary restrictions Discussed sodium, he will give it some work Drinks quite a bit of water and soda, mouth feels dry, discussed restriction of fluids and avoiding soda to 2L Discussed daily weights, he is doing this, has been steady 230 at his home scale since home  Device information Micra AV PPM implanted MDT 12/31/2020   Past Medical History:  Diagnosis Date   Acute respiratory failure with hypoxia (HCC) 12/23/2022   Alcohol withdrawal syndrome, with delirium (HCC) 12/27/2022   Diabetes mellitus without complication (HCC)    Diverticulitis    DVT (deep venous thrombosis) (HCC)    x3   Encephalopathy acute 12/28/2022   ETOH abuse    H/O colectomy    Hyperlipidemia    Hypertension    Sepsis associated hypotension (HCC) 08/03/2019   Smoker     Past Surgical History:  Procedure Laterality Date   COLOSTOMY     PACEMAKER LEADLESS INSERTION N/A 12/31/2020   Procedure: PACEMAKER LEADLESS INSERTION;  Surgeon: [name], MD;  Location: [facility] INVASIVE CV  LAB;  Service: Cardiovascular;  Laterality: N/A;   TONSILLECTOMY      Current Outpatient Medications  Medication Sig Dispense Refill   acetaminophen (TYLENOL) 650 MG CR tablet Take 1,300 mg by mouth every 8 (eight) hours as needed for pain.     amLODipine (NORVASC) 10 MG tablet Take 1 tablet (10 mg total) by mouth daily. 90 tablet 0   ascorbic acid (VITAMIN C) 500 MG tablet Take 1 tablet (500 mg total) by mouth daily. 30 tablet 1   atorvastatin (LIPITOR) 10 MG tablet Take 10 mg by mouth daily.     cetirizine (ZYRTEC) 10 MG tablet Take 10 mg by mouth daily.     dapagliflozin propanediol (FARXIGA) 10 MG TABS tablet Take 1 tablet (10 mg total) by mouth daily before breakfast. 30 tablet 2   fluticasone (FLOVENT HFA) 110 MCG/ACT inhaler Inhale 2 puffs into the lungs 2 (two) times daily. 1 each 2    folic acid (FOLVITE) 1 MG tablet TAKE 1 TABLET(1 MG) BY MOUTH DAILY 90 tablet 0   ipratropium-albuterol (DUONEB) 0.5-2.5 (3) MG/3ML SOLN Take 3 mLs by nebulization every 6 (six) hours as needed. 360 mL 0   metFORMIN (GLUCOPHAGE) 500 MG tablet Take 2 tablets by mouth with breakfast and 1 tablet by mouth at dinner 90 tablet 6   metoprolol succinate (TOPROL-XL) 50 MG 24 hr tablet Take 1 tablet (50 mg total) by mouth daily. Take with or immediately following a meal. 30 tablet 1   Multiple Vitamin (MULTIVITAMIN WITH MINERALS) TABS tablet Take 1 tablet by mouth every other day.      predniSONE (DELTASONE) 10 MG tablet Take 4 tabs for 3 days, then 3 tabs for 3 days, then 2 tabs for 3 days, then 1 tab for 3 days, then 1/2 tab for 4 days. 32 tablet 0   thiamine (VITAMIN B1) 100 MG tablet Take 1 tablet (100 mg total) by mouth daily. 90 tablet 0   torsemide (DEMADEX) 20 MG tablet Take 1 tablet (20 mg total) by mouth daily. 30 tablet 1   No current facility-administered medications for this visit.    Allergies:   Lisinopril and Codeine   Social History:  The patient  reports that he has been smoking cigarettes. He has a 44 pack-year smoking history. He uses smokeless tobacco. He reports current alcohol use. He reports current drug use. Drug: Marijuana.   Family History:  The patient's family history includes COPD in his mother; Prostate cancer in his father.  ROS:  Please see the history of present illness.    All other systems are reviewed and otherwise negative.   PHYSICAL EXAM:  VS:  There were no vitals taken for this visit. BMI: There is no height or weight on file to calculate BMI. Well nourished, well developed, in no acute distress, chronically ill appearing HEENT: normocephalic, atraumatic Neck: no JVD, carotid bruits or masses Cardiac: RRR; no significant murmurs, no rubs, or gallops Lungs: CTA b/l today Abd: ostomy R abdomen,not otherwise examined MS: no deformity or  atrophy Ext: no  edema Skin: warm and dry, no rash Neuro:  No gross deficits appreciated Psych: euthymic mood, full affect   EKG:  not done today   Device interrogation done today by industry and reviewed by myself:  Battery is 7.8 years Electrode measurements are good Reported near 100% A tracking Dependent today  12/24/22: TTE 1. Left ventricular ejection fraction, by estimation, is 50 to 55%. The  left ventricle has low normal function.  The left ventricle has no regional  wall motion abnormalities. Left ventricular diastolic parameters are  consistent with Grade II diastolic  dysfunction (pseudonormalization).   2. Right ventricular systolic function is normal. The right ventricular  size is normal. Tricuspid regurgitation signal is inadequate for assessing  PA pressure.   3. Left atrial size was mildly dilated.   4. The mitral valve is abnormal. Trivial mitral valve regurgitation. No  evidence of mitral stenosis. Severe mitral annular calcification.   5. The aortic valve was not well visualized. There is moderate  calcification of the aortic valve. Aortic valve regurgitation is not  visualized. No aortic stenosis is present.   6. Aortic dilatation noted. There is borderline dilatation of the  ascending aorta, measuring 39 mm.   Comparison(s): No significant change from prior study.     Recent Labs: 02/15/2023: ALT 33; B Natriuretic Peptide 136.7 02/16/2023: Magnesium 1.7; TSH 1.632 02/20/2023: BUN 32; Creatinine, Ser 0.84; Hemoglobin 13.6; Platelets 213; Potassium 3.5; Sodium 138  11/04/2022: Cholesterol 127; Direct LDL 51.0; HDL 32.50; Total CHOL/HDL Ratio 4; VLDL 74.2 12/26/2022: Triglycerides 522   Estimated Creatinine Clearance: 113.4 mL/min (by C-G formula based on SCr of 0.84 mg/dL).   Wt Readings from Last 3 Encounters:  02/20/23 226 lb 14.4 oz (102.9 kg)  01/20/23 232 lb 4 oz (105.3 kg)  01/09/23 230 lb (104.3 kg)     Other studies reviewed: Additional studies/records reviewed  today include: summarized above  ASSESSMENT AND PLAN:  PPM Intact function No programming changes made Dependent 100% RV pacing  HTN Looks ok  ? Of sarcoid by MRI 2022 4. Chronic CHF/HFpEF Recent hospitalizations, exacerbations He would benefit from HF team, discussed with him, he is agreeable He sees his PMD next week, reports pending labs there He sees wound care regularly He does not appear volume OL today    Disposition: here in 63mo, sooner if needed to assure he has some follow, if he has seen/seeing HF team we could push EP visits out    Current medicines are reviewed at length with the patient today.  The patient did not have any concerns regarding medicines.  Norma Fredrickson, PA-C 02/22/2023 8:57 AM     [facility] [address] [place] [address] [phone] (office)  [phone] (fax)

## 2023-02-22 NOTE — Telephone Encounter (Signed)
Left message for patient to call back  

## 2023-02-22 NOTE — Telephone Encounter (Signed)
Contacted by patient that he has not been able to pick up his prednisone from his pharmacy. Confirmed it was the correct pharmacy that was listed when order was initially e-scribed on 9/9. Contacted [facility] and confirmed they never received the prescription. Resent the prescription with cosign to Dr. Alvino Chapel (discharging provider). Instructed the pharmacy to call the patient when this was ready for him.   Sharen Hones, PharmD, BCPS Heart Failure Stewardship Pharmacist Phone [phone]

## 2023-02-23 ENCOUNTER — Ambulatory Visit: Payer: Medicaid Other | Attending: Physician Assistant | Admitting: Physician Assistant

## 2023-02-23 ENCOUNTER — Encounter: Payer: Self-pay | Admitting: Physician Assistant

## 2023-02-23 VITALS — BP 116/78 | HR 92 | Ht 73.0 in | Wt 230.6 lb

## 2023-02-23 DIAGNOSIS — Z950 Presence of cardiac pacemaker: Secondary | ICD-10-CM | POA: Diagnosis not present

## 2023-02-23 DIAGNOSIS — I10 Essential (primary) hypertension: Secondary | ICD-10-CM

## 2023-02-23 DIAGNOSIS — Z7689 Persons encountering health services in other specified circumstances: Secondary | ICD-10-CM | POA: Diagnosis not present

## 2023-02-23 DIAGNOSIS — I5032 Chronic diastolic (congestive) heart failure: Secondary | ICD-10-CM

## 2023-02-23 LAB — CUP PACEART INCLINIC DEVICE CHECK
Date Time Interrogation Session: 20240912125327
Implantable Pulse Generator Implant Date: 20220721

## 2023-02-23 NOTE — Patient Instructions (Signed)
Medication Instructions:   Your physician recommends that you continue on your current medications as directed. Please refer to the Current Medication list given to you today.  *If you need a refill on your cardiac medications before your next appointment, please call your pharmacy*   Lab Work: NONE ORDERED  TODAY    If you have labs (blood work) drawn today and your tests are completely normal, you will receive your results only by: MyChart Message (if you have MyChart) OR A paper copy in the mail If you have any lab test that is abnormal or we need to change your treatment, we will call you to review the results.   Testing/Procedures:NONE ORDERED  TODAY    Follow-Up: At [facility], you and your health needs are our priority.  As part of our continuing mission to provide you with exceptional heart care, we have created designated Provider Care Teams.  These Care Teams include your primary Cardiologist (physician) and Advanced Practice Providers (APPs -  Physician Assistants and Nurse Practitioners) who all work together to provide you with the care you need, when you need it.  We recommend signing up for the patient portal called "MyChart".  Sign up information is provided on this After Visit Summary.  MyChart is used to connect with patients for Virtual Visits (Telemedicine).  Patients are able to view lab/test results, encounter notes, upcoming appointments, etc.  Non-urgent messages can be sent to your provider as well.   To learn more about what you can do with MyChart, go to [email or web address].    Your next appointment:  AS SCHEDULED WITH HEART FAILURE  3-4 month(s) ( CONTACT [facility]SIE [facility]  FOR EP SCHEDULING ISSUES )   Provider:    You may see [name], MD or one of the following Advanced Practice Providers on your designated Care Team:   Francis Dowse, [place]   Other Instructions

## 2023-02-23 NOTE — Telephone Encounter (Signed)
Patient's medication was resent in yesterday

## 2023-02-24 ENCOUNTER — Ambulatory Visit: Payer: Medicaid Other | Admitting: Nurse Practitioner

## 2023-02-24 ENCOUNTER — Ambulatory Visit (HOSPITAL_COMMUNITY)
Admission: RE | Admit: 2023-02-24 | Discharge: 2023-02-24 | Disposition: A | Discharge status: Home / Self Care | Payer: Medicaid Other | Source: Ambulatory Visit | Attending: Nurse Practitioner | Admitting: Nurse Practitioner

## 2023-02-24 DIAGNOSIS — K9410 Enterostomy complication, unspecified: Secondary | ICD-10-CM | POA: Diagnosis not present

## 2023-02-24 DIAGNOSIS — L24B3 Irritant contact dermatitis related to fecal or urinary stoma or fistula: Secondary | ICD-10-CM | POA: Diagnosis not present

## 2023-02-24 DIAGNOSIS — K9413 Enterostomy malfunction: Secondary | ICD-10-CM | POA: Diagnosis not present

## 2023-02-24 DIAGNOSIS — Z7689 Persons encountering health services in other specified circumstances: Secondary | ICD-10-CM | POA: Diagnosis not present

## 2023-02-24 DIAGNOSIS — I509 Heart failure, unspecified: Secondary | ICD-10-CM | POA: Diagnosis not present

## 2023-02-24 DIAGNOSIS — Z91148 Patient's other noncompliance with medication regimen for other reason: Secondary | ICD-10-CM | POA: Insufficient documentation

## 2023-02-24 DIAGNOSIS — K469 Unspecified abdominal hernia without obstruction or gangrene: Secondary | ICD-10-CM | POA: Insufficient documentation

## 2023-02-24 NOTE — Progress Notes (Signed)
[facility]   Reason for visit:  RLQ ileostomy Midline abdominal wound  HPI:  Diverticulitis with perforation and end ileostomy Past Medical History:  Diagnosis Date  . Acute respiratory failure with hypoxia (HCC) 12/23/2022  . Alcohol withdrawal syndrome, with delirium (HCC) 12/27/2022  . Diabetes mellitus without complication (HCC)   . Diverticulitis   . DVT (deep venous thrombosis) (HCC)    x3  . Encephalopathy acute 12/28/2022  . ETOH abuse   . H/O colectomy   . Hyperlipidemia   . Hypertension   . Sepsis associated hypotension (HCC) 08/03/2019  . Smoker    Family History  Problem Relation Age of Onset  . COPD Mother   . Prostate cancer Father   . Colon cancer Neg Hx   . Rectal cancer Neg Hx   . Stomach cancer Neg Hx   . Esophageal cancer Neg Hx    Allergies  Allergen Reactions  . Lisinopril     Other reaction(s): renal effects  . Codeine Other (See Comments) and Hives    unknown Other reaction(s): Other (See Comments) unknown unknown   Current Outpatient Medications  Medication Sig Dispense Refill Last Dose  . acetaminophen (TYLENOL) 650 MG CR tablet Take 1,300 mg by mouth every 8 (eight) hours as needed for pain.     Marland Kitchen amLODipine (NORVASC) 10 MG tablet Take 1 tablet (10 mg total) by mouth daily. 90 tablet 0   . ascorbic acid (VITAMIN C) 500 MG tablet Take 1 tablet (500 mg total) by mouth daily. 30 tablet 1   . atorvastatin (LIPITOR) 10 MG tablet Take 10 mg by mouth daily.     . cetirizine (ZYRTEC) 10 MG tablet Take 10 mg by mouth daily.     . dapagliflozin propanediol (FARXIGA) 10 MG TABS tablet Take 1 tablet (10 mg total) by mouth daily before breakfast. 30 tablet 2   . fluticasone (FLOVENT HFA) 110 MCG/ACT inhaler Inhale 2 puffs into the lungs 2 (two) times daily. 1 each 2   . folic acid (FOLVITE) 1 MG tablet TAKE 1 TABLET(1 MG) BY MOUTH DAILY 90 tablet 0   . ipratropium-albuterol (DUONEB) 0.5-2.5 (3) MG/3ML SOLN Take 3 mLs by nebulization  every 6 (six) hours as needed. 360 mL 0   . metFORMIN (GLUCOPHAGE) 500 MG tablet Take 2 tablets by mouth with breakfast and 1 tablet by mouth at dinner 90 tablet 6   . metoprolol succinate (TOPROL-XL) 50 MG 24 hr tablet Take 1 tablet (50 mg total) by mouth daily. Take with or immediately following a meal. 30 tablet 1   . Multiple Vitamin (MULTIVITAMIN WITH MINERALS) TABS tablet Take 1 tablet by mouth every other day.      . predniSONE (DELTASONE) 10 MG tablet Take 4 tabs for 3 days, then 3 tabs for 3 days, then 2 tabs for 3 days, then 1 tab for 3 days, then 1/2 tab for 4 days. 32 tablet 0   . thiamine (VITAMIN B1) 100 MG tablet Take 1 tablet (100 mg total) by mouth daily. 90 tablet 0   . torsemide (DEMADEX) 20 MG tablet Take 1 tablet (20 mg total) by mouth daily. 30 tablet 1    No current facility-administered medications for this encounter.   ROS  Review of Systems  Constitutional:  Positive for fatigue.  Cardiovascular:        Pacemaker CHF exacerbation  Gastrointestinal:        RLQ ileostomy Abdominal hernia  Musculoskeletal:  Positive for gait  problem.  Skin:  Positive for rash and wound.  All other systems reviewed and are negative. Vital signs:  BP (!) 150/76 (BP Location: Right Arm)   Pulse 92   Temp 98.1 F (36.7 C) (Oral)   Resp 20   SpO2 94%  Exam:  Physical Exam Vitals reviewed.  Constitutional:      Appearance: He is obese.  HENT:     Mouth/Throat:     Mouth: Mucous membranes are moist.  Cardiovascular:     Comments: Recent CHF exacerbation Abdominal:     Hernia: A hernia is present.  Skin:    General: Skin is warm and dry.     Findings: Erythema, lesion and rash present.  Neurological:     Mental Status: He is alert and oriented to person, place, and time.  Psychiatric:        Mood and Affect: Mood normal.        Behavior: Behavior normal.    Stoma type/location:  RlQ ileostomy Stomal assessment/size:  1" flush  Peristomal assessment:  red and  intact Treatment options for stomal/peristomal skin: Barrier ring skin tac skin adhesive  1 piece flat pouch Output: liquid brown stool Ostomy pouching: 1pc.  Education provided:  ongoing pouching  recent hospitalization for CHF exacerbation     Impression/dx  Ileostomy complication Abdominal wound, improving Awaiting reversal Recent CHF exacerbation Discussion  Patient isn't complying with his CHF medication regimen.  States there are too many.  I print out a detailed list of his medications. We discuss making a list to keep up with medications Plan  See back as needed    Visit time: 45 minutes.   Maple Hudson FNP-BC

## 2023-02-27 ENCOUNTER — Telehealth: Payer: Self-pay

## 2023-02-27 DIAGNOSIS — Z7689 Persons encountering health services in other specified circumstances: Secondary | ICD-10-CM | POA: Diagnosis not present

## 2023-02-27 NOTE — Transitions of Care (Post Inpatient/ED Visit) (Signed)
02/27/2023  Name: Luis [name] MRN: [id number] DOB: Oct 07, 1960  Today's TOC FU Call Status: Today's TOC FU Call Status:: Unsuccessful Call (1st Attempt) Unsuccessful Call (1st Attempt) Date: 02/27/23  Attempted to reach the patient regarding the most recent Inpatient/ED visit.  Follow Up Plan: Additional outreach attempts will be made to reach the patient to complete the Transitions of Care (Post Inpatient/ED visit) call.   Alyse Low, RN, BA, [facility], CRRN [facility] Coordinator, Transition of Care Ph # [phone]

## 2023-02-28 ENCOUNTER — Ambulatory Visit (HOSPITAL_COMMUNITY)
Admission: RE | Admit: 2023-02-28 | Discharge: 2023-02-28 | Disposition: A | Discharge status: Home / Self Care | Payer: Medicaid Other | Source: Ambulatory Visit | Attending: *Deleted | Admitting: *Deleted

## 2023-02-28 DIAGNOSIS — Z432 Encounter for attention to ileostomy: Secondary | ICD-10-CM | POA: Insufficient documentation

## 2023-02-28 DIAGNOSIS — K435 Parastomal hernia without obstruction or  gangrene: Secondary | ICD-10-CM | POA: Diagnosis not present

## 2023-02-28 DIAGNOSIS — Z7689 Persons encountering health services in other specified circumstances: Secondary | ICD-10-CM | POA: Diagnosis not present

## 2023-02-28 DIAGNOSIS — K9413 Enterostomy malfunction: Secondary | ICD-10-CM | POA: Diagnosis not present

## 2023-02-28 NOTE — Discharge Instructions (Signed)
See Monday 03/06/23  @12 :00

## 2023-02-28 NOTE — Progress Notes (Signed)
[facility]   Reason for visit:  RLQ ileostomy Abdominal hernia Healing abdominal wound HPI:  Perforated diverticulum with end ileostomy Past Medical History:  Diagnosis Date   Acute respiratory failure with hypoxia (HCC) 12/23/2022   Alcohol withdrawal syndrome, with delirium (HCC) 12/27/2022   Diabetes mellitus without complication (HCC)    Diverticulitis    DVT (deep venous thrombosis) (HCC)    x3   Encephalopathy acute 12/28/2022   ETOH abuse    H/O colectomy    Hyperlipidemia    Hypertension    Sepsis associated hypotension (HCC) 08/03/2019   Smoker    Family History  Problem Relation Age of Onset   COPD Mother    Prostate cancer Father    Colon cancer Neg Hx    Rectal cancer Neg Hx    Stomach cancer Neg Hx    Esophageal cancer Neg Hx    Allergies  Allergen Reactions   Lisinopril     Other reaction(s): renal effects   Codeine Other (See Comments) and Hives    unknown Other reaction(s): Other (See Comments) unknown unknown   Current Outpatient Medications  Medication Sig Dispense Refill Last Dose   acetaminophen (TYLENOL) 650 MG CR tablet Take 1,300 mg by mouth every 8 (eight) hours as needed for pain.      amLODipine (NORVASC) 10 MG tablet Take 1 tablet (10 mg total) by mouth daily. 90 tablet 0    ascorbic acid (VITAMIN C) 500 MG tablet Take 1 tablet (500 mg total) by mouth daily. 30 tablet 1    atorvastatin (LIPITOR) 10 MG tablet Take 10 mg by mouth daily.      cetirizine (ZYRTEC) 10 MG tablet Take 10 mg by mouth daily.      dapagliflozin propanediol (FARXIGA) 10 MG TABS tablet Take 1 tablet (10 mg total) by mouth daily before breakfast. 90 tablet 2    fluticasone (FLOVENT HFA) 110 MCG/ACT inhaler Inhale 2 puffs into the lungs 2 (two) times daily. 1 each 2    folic acid (FOLVITE) 1 MG tablet TAKE 1 TABLET(1 MG) BY MOUTH DAILY 90 tablet 0    hydrOXYzine (VISTARIL) 50 MG capsule Take 1 capsule (50 mg total) by mouth at bedtime as needed. 30  capsule 2    ipratropium-albuterol (DUONEB) 0.5-2.5 (3) MG/3ML SOLN Take 3 mLs by nebulization every 6 (six) hours as needed. 360 mL 0    LOVAZA 1 g capsule Take 1 capsule (1 g total) by mouth daily. 90 capsule 2    metFORMIN (GLUCOPHAGE) 500 MG tablet Take 2 tablets by mouth with breakfast and 1 tablet by mouth at dinner 90 tablet 6    metoprolol succinate (TOPROL-XL) 50 MG 24 hr tablet Take 1 tablet (50 mg total) by mouth daily. Take with or immediately following a meal. 30 tablet 1    Multiple Vitamin (MULTIVITAMIN WITH MINERALS) TABS tablet Take 1 tablet by mouth every other day.       predniSONE (DELTASONE) 10 MG tablet Take 4 tabs for 3 days, then 3 tabs for 3 days, then 2 tabs for 3 days, then 1 tab for 3 days, then 1/2 tab for 4 days. 32 tablet 0    thiamine (VITAMIN B1) 100 MG tablet Take 1 tablet (100 mg total) by mouth daily. 90 tablet 0    torsemide (DEMADEX) 20 MG tablet Take 1 tablet (20 mg total) by mouth daily. 30 tablet 1    No current facility-administered medications for this encounter.  ROS  Review of Systems  Constitutional:  Positive for fatigue.       Recent CHF exacerbation.  Improved since diuresed  Cardiovascular:        CHF  shortness of breath at rest  Gastrointestinal:        RLQ ileostomy Abdominal hernia  Musculoskeletal:  Positive for gait problem.       Weakness  Skin:  Positive for wound.       OPen wound to abdomen, improving  Psychiatric/Behavioral: Negative.    All other systems reviewed and are negative.  Vital signs:  BP (!) 147/88 (BP Location: Right Arm)   Pulse 95   Temp 98.6 F (37 C) (Oral)   Resp 18   SpO2 90%  Exam:  Physical Exam Vitals reviewed.  Constitutional:      Appearance: He is obese.  Cardiovascular:     Comments: Pacemaker CHF Pulmonary:     Breath sounds: Wheezing present.  Abdominal:     Hernia: A hernia is present.  Skin:    General: Skin is warm and dry.  Neurological:     General: No focal deficit  present.     Mental Status: He is alert and oriented to person, place, and time.  Psychiatric:        Mood and Affect: Mood normal.        Behavior: Behavior normal.     Stoma type/location:  RLQ ileostomy Stomal assessment/size:  1" flush Peristomal assessment:  Large abdominal hernia Treatment options for stomal/peristomal skin: barrier ring and 1 piece flat pouch Output: liquid tan stool Ostomy pouching: 1pc. Education provided:  wounds to abdomen are nearly healed.  Continue VASHE washes and aquacel to open wounds.  Medical adhesive related skin injury to abdomen is markedly improved.     Impression/dx  Hernia Ileostomy Chronic abdominal wound Discussion  See above Plan  Back as needed.     Visit time: 45 minutes.   Maple Hudson FNP-BC

## 2023-03-02 ENCOUNTER — Telehealth: Payer: Self-pay

## 2023-03-02 ENCOUNTER — Ambulatory Visit (INDEPENDENT_AMBULATORY_CARE_PROVIDER_SITE_OTHER): Payer: Medicaid Other | Admitting: Nurse Practitioner

## 2023-03-02 VITALS — BP 128/80 | HR 92 | Temp 98.9°F | Ht 73.0 in | Wt 228.2 lb

## 2023-03-02 DIAGNOSIS — Z7689 Persons encountering health services in other specified circumstances: Secondary | ICD-10-CM | POA: Diagnosis not present

## 2023-03-02 DIAGNOSIS — E785 Hyperlipidemia, unspecified: Secondary | ICD-10-CM | POA: Diagnosis not present

## 2023-03-02 DIAGNOSIS — I5042 Chronic combined systolic (congestive) and diastolic (congestive) heart failure: Secondary | ICD-10-CM | POA: Diagnosis not present

## 2023-03-02 DIAGNOSIS — R0602 Shortness of breath: Secondary | ICD-10-CM

## 2023-03-02 DIAGNOSIS — Z23 Encounter for immunization: Secondary | ICD-10-CM | POA: Diagnosis not present

## 2023-03-02 DIAGNOSIS — G4700 Insomnia, unspecified: Secondary | ICD-10-CM | POA: Diagnosis not present

## 2023-03-02 DIAGNOSIS — Z7984 Long term (current) use of oral hypoglycemic drugs: Secondary | ICD-10-CM

## 2023-03-02 DIAGNOSIS — Z9181 History of falling: Secondary | ICD-10-CM | POA: Insufficient documentation

## 2023-03-02 DIAGNOSIS — E1169 Type 2 diabetes mellitus with other specified complication: Secondary | ICD-10-CM

## 2023-03-02 MED ORDER — LOVAZA 1 G PO CAPS
1.0000 g | ORAL_CAPSULE | Freq: Every day | ORAL | 2 refills | Status: DC
Start: 2023-03-02 — End: 2023-03-09

## 2023-03-02 MED ORDER — HYDROXYZINE PAMOATE 50 MG PO CAPS
50.0000 mg | ORAL_CAPSULE | Freq: Every evening | ORAL | 2 refills | Status: DC | PRN
Start: 2023-03-02 — End: 2023-04-20

## 2023-03-02 MED ORDER — DAPAGLIFLOZIN PROPANEDIOL 10 MG PO TABS: 10.0000 mg | ORAL_TABLET | Freq: Every day | ORAL | 2 refills | Status: DC

## 2023-03-02 NOTE — Progress Notes (Signed)
0  Established Patient Office Visit  Subjective   Patient ID: Luis [name], male    DOB: 07/25/1960  Age: 62 y.o. MRN: [id number]  Chief Complaint  Patient presents with   Follow-up    1 month, patient states his son gave him a hydaoxyzine HCL to help him sleep. Son noticed he doesn't sleep much and was concerned. Pt also wanted to talk about starting Ozempic, also had questions about flu and pneumonia shot. Patient wanted a script for hand rail for his shower    Estimated Creatinine Clearance: 113.7 mL/min (by C-G formula based on SCr of 0.84 mg/dL).  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  Patient has today for follow-up.  He was recently discharged from hospital.    CHF (chronic combined systolic and diastolic)/HTN: Was hospitalized from 02/15/2023 to 02/20/2023.  Primary diagnosis was acute exacerbation of CHF.  He follows with cardiology regularly.  Currently takes amlodipine 10 mg daily, Farxiga 10 mg daily, metoprolol 50 mg daily, and torsemide 20 mg daily.  Reports that today he feels great, and in fact feels much better than he has in recent past.  Reports increase in energy and improved mood.  He is still drinking alcohol regularly and is working on trying to cut back.  He has a hard time adhering to a low-sodium diet as well as to adhering to fluid restriction.  He does monitor his weights regularly at home.  Type 2 diabetes/hyperlipidemia: Last A1c 7.0.  He continues on [place] and metformin tolerating these medications well.  He is also on atorvastatin.  Could not tolerate lisinopril due to side effect of kidney dysfunction in the past.  Last lipid panel identified LDL of 51 and  triglyceride greater than 500  Insomnia: Reports has a hard time falling asleep.  Would like to try medication for this if possible.  Shortness of breath: Thought to have COPD in the past, I have not been able to find evidence that he is undergone evaluation or pulmonary function testing in the past.  Today feels that his breathing is good.  At risk for falls: Requesting prescription for shower handrail      ROS: see HPI    Objective:     BP 128/80 (BP Location: Left Arm, Patient Position: Sitting, Cuff Size: Normal)   Pulse 92   Temp 98.9 F (37.2 C) (Oral)   Ht 6\' 1"  (1.854 m)   Wt 228 lb 3.2 oz (103.5 kg)   SpO2 95%   BMI 30.11 kg/m  BP Readings from Last 3 Encounters:  03/02/23 128/80  02/28/23 (!) 147/88  02/24/23 (!) 150/76   Wt Readings from Last 3 Encounters:  03/02/23 228 lb 3.2 oz (103.5 kg)  02/23/23 230 lb 9.6 oz (104.6 kg)  02/20/23 226 lb 14.4 oz (102.9 kg)      Physical Exam Vitals reviewed.  Constitutional:      Appearance: Normal appearance.  HENT:     Head: Normocephalic and atraumatic.  Cardiovascular:     Rate and Rhythm: Normal rate and regular rhythm.  Pulmonary:     Effort: Pulmonary effort is normal.     Breath sounds: Normal breath sounds.  Musculoskeletal:     Cervical back: Neck supple.  Skin:  General: Skin is warm and dry.  Neurological:     Mental Status: He is alert and oriented to person, place, and time.  Psychiatric:        Mood and Affect: Mood normal.        Behavior: Behavior normal.        Thought Content: Thought content normal.        Judgment: Judgment normal.      No results found for any visits on 03/02/23.    The ASCVD Risk score (Arnett DK, et al., 2019) failed to calculate for the following reasons:   The valid total cholesterol range is 130 to 320 mg/dL    Assessment & Plan:   Problem List Items Addressed This Visit       Cardiovascular and Mediastinum   Chronic combined systolic and  diastolic heart failure (HCC)    Chronic Patient euvolemic on exam today Continue amlodipine 10 mg daily, Farxiga 10 mg daily, metoprolol 50 mg daily, and torsemide 20 mg daily Patient encouraged to adhere to a low sodium diet, handout provided. Follow-up with cardiology as scheduled      Relevant Medications   dapagliflozin propanediol (FARXIGA) 10 MG TABS tablet   LOVAZA 1 g capsule   Other Relevant Orders   AMB Referral to Pharmacy Medication Management     Endocrine   Type 2 diabetes mellitus with hyperlipidemia (HCC)    Chronic, A1c at goal Continue metformin 1000 mg in the morning and 500 mg at dinner and Farxiga 10 mg daily Continue atorvastatin Continue off of ACE/ARB due to side effect of renal complications secondary to lisinopril      Relevant Medications   dapagliflozin propanediol (FARXIGA) 10 MG TABS tablet   LOVAZA 1 g capsule   Other Relevant Orders   AMB Referral to Pharmacy Medication Management     Other   Insomnia    Chronic Discussed treatment options he would like to trial hydroxyzine, prescription for patient to take 50 mg at bedtime as needed sent to pharmacy. Has been referred to neurology in the past undergo sleep study, I do not see that this was ever completed.  Thus, will rerefer as he has multiple risk factors for sleep apnea.      Relevant Medications   hydrOXYzine (VISTARIL) 50 MG capsule   Other Relevant Orders   Ambulatory referral to Neurology   HLD (hyperlipidemia)    Chronic Continue atorvastatin 10 mg daily and add Lovaza 1 g by mouth daily      Relevant Medications   LOVAZA 1 g capsule   At risk for falling - Primary    I think shower handrail would be appropriate to help reduce fall risk in this patient. DME prescription provided today.  Patient educated to bring this to medical equipment store.      Relevant Orders   For home use only DME Other see comment   Shortness of breath    Chronic Refer to pulmonology to undergo  pulmonary function testing to determine if he has COPD.      Relevant Orders   Ambulatory referral to Pulmonology   Needs flu shot    Flu and prevnar 20 administered, VIS provided       Return in about 1 month (around 04/01/2023) for F/U with Mireyah Chervenak.    [name], NP

## 2023-03-02 NOTE — Discharge Instructions (Signed)
See back as needed.

## 2023-03-02 NOTE — Assessment & Plan Note (Addendum)
Chronic Patient euvolemic on exam today Continue amlodipine 10 mg daily, Farxiga 10 mg daily, metoprolol 50 mg daily, and torsemide 20 mg daily Patient encouraged to adhere to a low sodium diet, handout provided. Follow-up with cardiology as scheduled

## 2023-03-02 NOTE — Assessment & Plan Note (Signed)
Chronic Continue atorvastatin 10 mg daily and add Lovaza 1 g by mouth daily

## 2023-03-02 NOTE — Assessment & Plan Note (Signed)
Chronic Discussed treatment options he would like to trial hydroxyzine, prescription for patient to take 50 mg at bedtime as needed sent to pharmacy. Has been referred to neurology in the past undergo sleep study, I do not see that this was ever completed.  Thus, will rerefer as he has multiple risk factors for sleep apnea.

## 2023-03-02 NOTE — Assessment & Plan Note (Signed)
Chronic Refer to pulmonology to undergo pulmonary function testing to determine if he has COPD.

## 2023-03-02 NOTE — Assessment & Plan Note (Signed)
I think shower handrail would be appropriate to help reduce fall risk in this patient. DME prescription provided today.  Patient educated to bring this to medical equipment store.

## 2023-03-02 NOTE — Assessment & Plan Note (Signed)
Flu and prevnar 20 administered, VIS provided

## 2023-03-02 NOTE — Patient Instructions (Signed)
Consider RSV and Covid Vaccine

## 2023-03-02 NOTE — Assessment & Plan Note (Signed)
Chronic, A1c at goal Continue metformin 1000 mg in the morning and 500 mg at dinner and Farxiga 10 mg daily Continue atorvastatin Continue off of ACE/ARB due to side effect of renal complications secondary to lisinopril

## 2023-03-02 NOTE — Progress Notes (Signed)
Care Guide Note  03/02/2023 Name: [name] MRN: [id number] DOB: 1960/08/29  Referred by: [name], NP Reason for referral : Care Coordination (Outreach to schedule with pharm d )   Luis [name] is a 62 y.o. year old male who is a primary care patient of [name], NP. [name] was referred to the pharmacist for assistance related to HTN, HLD, and DM.    Successful contact was made with the patient to discuss pharmacy services including being ready for the pharmacist to call at least 5 minutes before the scheduled appointment time, to have medication bottles and any blood sugar or blood pressure readings ready for review. The patient agreed to meet with the pharmacist via with the pharmacist via telephone visit on (date/time).  03/07/2023  Penne Lash, RMA Care Guide [facility]  [place], [place] [address] Direct Dial: [phone] [email or web address]

## 2023-03-02 NOTE — Transitions of Care (Post Inpatient/ED Visit) (Signed)
03/02/2023  Name: Antwon Wambach MRN: [id number] DOB: 02/26/1961  Today's TOC FU Call Status: Today's TOC FU Call Status:: Unsuccessful Call (2nd Attempt) Unsuccessful Call (2nd Attempt) Date: 03/02/23  Attempted to reach the patient regarding the most recent Inpatient/ED visit.  Follow Up Plan: Additional outreach attempts will be made to reach the patient to complete the Transitions of Care (Post Inpatient/ED visit) call.   Alyse Low, RN, BA, [facility], CRRN [facility] Care Coordinator, Transition of Care Ph # [phone]

## 2023-03-03 ENCOUNTER — Telehealth (HOSPITAL_COMMUNITY): Payer: Self-pay

## 2023-03-03 NOTE — Telephone Encounter (Signed)
Called to confirm Heart & Vascular Transitions of Care appointment. Patient reminded to bring all medications and pill box organizer with them. Confirmed patient has transportation. Gave directions, instructed to utilize valet parking.  Confirmed appointment prior to ending call.

## 2023-03-05 ENCOUNTER — Other Ambulatory Visit: Payer: Self-pay

## 2023-03-05 ENCOUNTER — Emergency Department (HOSPITAL_COMMUNITY)
Admission: EM | Admit: 2023-03-05 | Discharge: 2023-03-05 | Disposition: A | Discharge status: Home / Self Care | Payer: Medicaid Other | Attending: Emergency Medicine | Admitting: Emergency Medicine

## 2023-03-05 ENCOUNTER — Encounter (HOSPITAL_COMMUNITY): Payer: Self-pay | Admitting: *Deleted

## 2023-03-05 DIAGNOSIS — E119 Type 2 diabetes mellitus without complications: Secondary | ICD-10-CM | POA: Diagnosis not present

## 2023-03-05 DIAGNOSIS — M7981 Nontraumatic hematoma of soft tissue: Secondary | ICD-10-CM | POA: Insufficient documentation

## 2023-03-05 DIAGNOSIS — Z7984 Long term (current) use of oral hypoglycemic drugs: Secondary | ICD-10-CM | POA: Diagnosis not present

## 2023-03-05 DIAGNOSIS — S31109A Unspecified open wound of abdominal wall, unspecified quadrant without penetration into peritoneal cavity, initial encounter: Secondary | ICD-10-CM | POA: Diagnosis not present

## 2023-03-05 DIAGNOSIS — I509 Heart failure, unspecified: Secondary | ICD-10-CM | POA: Insufficient documentation

## 2023-03-05 DIAGNOSIS — R58 Hemorrhage, not elsewhere classified: Secondary | ICD-10-CM | POA: Diagnosis not present

## 2023-03-05 DIAGNOSIS — R739 Hyperglycemia, unspecified: Secondary | ICD-10-CM | POA: Diagnosis not present

## 2023-03-05 DIAGNOSIS — I10 Essential (primary) hypertension: Secondary | ICD-10-CM | POA: Diagnosis not present

## 2023-03-05 DIAGNOSIS — L0390 Cellulitis, unspecified: Secondary | ICD-10-CM | POA: Diagnosis not present

## 2023-03-05 NOTE — ED Triage Notes (Addendum)
Pt states he was getting ready to take a shower when blood started spurting out of his abdomen.  Pt has chronic cellulitis on his abdomen from previous surgery (4 years prior).  Per [facility], quite a large loss of blood.  Bleeding controlled with abd pads.  No LOC. Pt has no other complaints.  VS stable

## 2023-03-05 NOTE — ED Provider Notes (Signed)
[place] [facility] Provider Note   CSN: [id number] Arrival date & time: 03/05/23  1502     History  Chief Complaint  Patient presents with   Bleeding/Bruising    Luis [name] is a 62 y.o. male.  HPI 62 year old male history of of heart failure, type 2 diabetes, chronic abdominal wound with ostomy presenting for bleeding.  States he was getting ready get a shower when his chronic wound over his abdomen started bleeding.  Was reportedly spurting blood.  He called EMS because he could not get it to stop.  He said bleeding like this before but usually can stop it on his own.  He is not on blood thinners.  He states he is chronic wound is recently discharged from the hospital.  He states he feels like his wound is better than it ever has been he has no concerns.  No fevers or chills or abdominal pain.  Normal ostomy output.  He feels well and would like to go home.  No dizziness or syncope.  No vomiting.  No chest pain or shortness of breath.     Home Medications Prior to Admission medications   Medication Sig Start Date End Date Taking? Authorizing Provider  acetaminophen (TYLENOL) 650 MG CR tablet Take 1,300 mg by mouth every 8 (eight) hours as needed for pain.    [provider]  amLODipine (NORVASC) 10 MG tablet Take 1 tablet (10 mg total) by mouth daily. 02/10/23   [name], NP  atorvastatin (LIPITOR) 10 MG tablet Take 10 mg by mouth daily. 11/20/20   [provider]  cetirizine (ZYRTEC) 10 MG tablet Take 10 mg by mouth daily. 02/13/23   [provider]  dapagliflozin propanediol (FARXIGA) 10 MG TABS tablet Take 1 tablet (10 mg total) by mouth daily before breakfast. 03/02/23   [name], NP  fluticasone (FLOVENT HFA) 110 MCG/ACT inhaler Inhale 2 puffs into the lungs 2 (two) times daily. 01/01/23 01/01/24  Barnetta Chapel, MD  folic acid (FOLVITE) 1 MG tablet TAKE 1 TABLET(1 MG) BY MOUTH DAILY 12/26/22   [name], MD  hydrOXYzine (VISTARIL) 50 MG capsule Take 1 capsule (50 mg total) by mouth at bedtime as needed. 03/02/23   [name], NP  ipratropium-albuterol (DUONEB) 0.5-2.5 (3) MG/3ML SOLN Take 3 mLs by nebulization every 6 (six) hours as needed. 02/10/23   [name], NP  LOVAZA 1 g capsule Take 1 capsule (1 g total) by mouth daily. 03/02/23   [name], NP  metFORMIN (GLUCOPHAGE) 500 MG tablet Take 2 tablets by mouth with breakfast and 1 tablet by mouth at dinner 01/20/23   [name], NP  metoprolol succinate (TOPROL-XL) 50 MG 24 hr tablet Take 1 tablet (50 mg total) by mouth daily. Take with or immediately following a meal. 02/20/23   Noralee Stain, DO  Multiple Vitamin (MULTIVITAMIN WITH MINERALS) TABS tablet Take 1 tablet by mouth every other day.     [provider]  predniSONE (DELTASONE) 10 MG tablet Take 4 tabs for 3 days, then 3 tabs for 3 days, then 2 tabs for 3 days, then 1 tab for 3 days, then 1/2 tab for 4 days. 02/22/23   Noralee Stain, DO  thiamine (VITAMIN B1) 100 MG tablet Take 1 tablet (100 mg total) by mouth daily. 11/10/22   [name], NP  torsemide (DEMADEX) 20 MG tablet Take 1 tablet (20 mg total) by  mouth daily. 02/20/23 03/22/23  Noralee Stain, DO      Allergies    Lisinopril and Codeine    Review of Systems   Review of Systems Review of systems completed and notable as per HPI.  ROS otherwise negative.   Physical Exam Updated Vital Signs BP 133/79   Pulse 95   Resp 17   Ht 6\' 1"  (1.854 m)   Wt 103.5 kg   SpO2 93%   BMI 30.11 kg/m  Physical Exam Vitals and nursing note reviewed.  Constitutional:      General: He is not in acute distress.    Appearance: He is well-developed.  HENT:     Head: Normocephalic and atraumatic.     Nose: Nose normal.     Mouth/Throat:     Mouth: Mucous membranes are moist.     Pharynx: Oropharynx is clear.  Eyes:     Extraocular Movements: Extraocular movements intact.     Conjunctiva/sclera:  Conjunctivae normal.     Pupils: Pupils are equal, round, and reactive to light.  Cardiovascular:     Rate and Rhythm: Normal rate and regular rhythm.     Heart sounds: No murmur heard. Pulmonary:     Effort: Pulmonary effort is normal. No respiratory distress.     Breath sounds: Normal breath sounds.  Abdominal:     Palpations: Abdomen is soft.     Tenderness: There is no abdominal tenderness.     Comments: Significant chronic abdominal wound.  A couple areas of excoriation, no active bleeding.  No tenderness even with the palpation.  Normal ostomy output.  Musculoskeletal:        General: No swelling.     Cervical back: Neck supple.     Right lower leg: No edema.     Left lower leg: No edema.  Skin:    General: Skin is warm and dry.     Capillary Refill: Capillary refill takes less than 2 seconds.  Neurological:     General: No focal deficit present.     Mental Status: He is alert and oriented to person, place, and time. Mental status is at baseline.  Psychiatric:        Mood and Affect: Mood normal.     ED Results / Procedures / Treatments   Labs (all labs ordered are listed, but only abnormal results are displayed) Labs Reviewed  CBC WITH DIFFERENTIAL/PLATELET  BASIC METABOLIC PANEL  PROTIME-INR    EKG None  Radiology No results found.  Procedures Procedures    Medications Ordered in ED Medications - No data to display  ED Course/ Medical Decision Making/ A&P                                 Medical Decision Making Amount and/or Complexity of Data Reviewed Labs: ordered.   Medical Decision Making:   Luis [name] is a 62 y.o. male who presented to the ED today with bleeding from chronic abdominal wound.  On exam he is well-appearing.  Reports bleeding from chronic abdominal wound.  I evaluated his wound and compared to picture from prior to recent hospitalization, he does appear slightly improved from prior.  I do not see any clear induration, fluctuance  or active infection.  He had significant bleeding today.  He has very thin skin here with some visible blood vessels, I suspect he had some mild skin trauma that caused open a one  of the blood vessels.  It is now hemostatic.  Is not anticoagulated, hemodynamically stable.  I did recommend to him that we watch him for several hours and get some blood work to evaluate his hemoglobin to make sure he does not have recurrent bleeding.  The rest of his abdominal exam is benign.  However patient does not want to stay.  He does not want to be stuck for blood and now the bleeding is stopped he just wants to go home.  I discussed with him that I am concerned he could have recurrent bleeding which could cause life-threatening bleeding and potentially death or need to further hospitalization.  He states he will come back and he has follow-up tomorrow.  He is adamant that he would like to leave.  He has capacity to make this decision.  He ultimately left AGAINST MEDICAL ADVICE.   Patient placed on continuous vitals and telemetry monitoring while in ED which was reviewed periodically.  Reviewed and confirmed nursing documentation for past medical history, family history, social history.  Patient's presentation is most consistent with acute presentation with potential threat to life or bodily function.           Final Clinical Impression(s) / ED Diagnoses Final diagnoses:  Bleeding    Rx / DC Orders ED Discharge Orders     None         Laurence Spates, MD 03/05/23 [phone]

## 2023-03-05 NOTE — Discharge Instructions (Signed)
You were seen today for bleeding.  I recommended you stay for further evaluation but you elected to leave the hospital.  I recommend you keep your appointment tomorrow for follow-up.  If you develop any recurrent bleeding, dizziness, fainting or any other new concerning symptoms you should return to the ED.

## 2023-03-06 ENCOUNTER — Ambulatory Visit (HOSPITAL_COMMUNITY)
Admission: RE | Admit: 2023-03-06 | Discharge: 2023-03-06 | Disposition: A | Discharge status: Home / Self Care | Payer: Medicaid Other | Source: Ambulatory Visit | Attending: Physician Assistant | Admitting: Physician Assistant

## 2023-03-06 ENCOUNTER — Encounter (HOSPITAL_COMMUNITY): Payer: Self-pay

## 2023-03-06 ENCOUNTER — Ambulatory Visit (HOSPITAL_COMMUNITY)
Admission: RE | Admit: 2023-03-06 | Discharge: 2023-03-06 | Disposition: A | Discharge status: Home / Self Care | Payer: Medicaid Other | Source: Ambulatory Visit | Attending: *Deleted | Admitting: *Deleted

## 2023-03-06 VITALS — BP 148/98 | HR 96 | Wt 228.2 lb

## 2023-03-06 DIAGNOSIS — T148XXA Other injury of unspecified body region, initial encounter: Secondary | ICD-10-CM | POA: Diagnosis not present

## 2023-03-06 DIAGNOSIS — F1010 Alcohol abuse, uncomplicated: Secondary | ICD-10-CM | POA: Insufficient documentation

## 2023-03-06 DIAGNOSIS — I110 Hypertensive heart disease with heart failure: Secondary | ICD-10-CM | POA: Diagnosis not present

## 2023-03-06 DIAGNOSIS — K469 Unspecified abdominal hernia without obstruction or gangrene: Secondary | ICD-10-CM | POA: Diagnosis not present

## 2023-03-06 DIAGNOSIS — Z9049 Acquired absence of other specified parts of digestive tract: Secondary | ICD-10-CM | POA: Diagnosis not present

## 2023-03-06 DIAGNOSIS — I442 Atrioventricular block, complete: Secondary | ICD-10-CM

## 2023-03-06 DIAGNOSIS — I10 Essential (primary) hypertension: Secondary | ICD-10-CM | POA: Diagnosis not present

## 2023-03-06 DIAGNOSIS — Z79899 Other long term (current) drug therapy: Secondary | ICD-10-CM | POA: Insufficient documentation

## 2023-03-06 DIAGNOSIS — L24B3 Irritant contact dermatitis related to fecal or urinary stoma or fistula: Secondary | ICD-10-CM

## 2023-03-06 DIAGNOSIS — E119 Type 2 diabetes mellitus without complications: Secondary | ICD-10-CM | POA: Diagnosis not present

## 2023-03-06 DIAGNOSIS — K9413 Enterostomy malfunction: Secondary | ICD-10-CM

## 2023-03-06 DIAGNOSIS — Z720 Tobacco use: Secondary | ICD-10-CM

## 2023-03-06 DIAGNOSIS — I5032 Chronic diastolic (congestive) heart failure: Secondary | ICD-10-CM | POA: Insufficient documentation

## 2023-03-06 DIAGNOSIS — F17210 Nicotine dependence, cigarettes, uncomplicated: Secondary | ICD-10-CM | POA: Diagnosis not present

## 2023-03-06 DIAGNOSIS — Z7689 Persons encountering health services in other specified circumstances: Secondary | ICD-10-CM | POA: Diagnosis not present

## 2023-03-06 LAB — BASIC METABOLIC PANEL
Anion gap: 14 (ref 5–15)
BUN: 32 mg/dL — ABNORMAL HIGH (ref 8–23)
CO2: 26 mmol/L (ref 22–32)
Calcium: 9.8 mg/dL (ref 8.9–10.3)
Chloride: 93 mmol/L — ABNORMAL LOW (ref 98–111)
Creatinine, Ser: 1.23 mg/dL (ref 0.61–1.24)
GFR, Estimated: >60 mL/min (ref >60)
Glucose, Bld: 348 mg/dL — ABNORMAL HIGH (ref 70–99)
Potassium: 4.2 mmol/L (ref 3.5–5.1)
Sodium: 133 mmol/L — ABNORMAL LOW (ref 135–145)

## 2023-03-06 MED ORDER — LOSARTAN POTASSIUM 25 MG PO TABS: 25.0000 mg | ORAL_TABLET | Freq: Every day | ORAL | 5 refills | Status: DC

## 2023-03-06 NOTE — Progress Notes (Addendum)
HEART & VASCULAR TRANSITION OF CARE CONSULT NOTE     Referring Physician: Dr. Alvino Chapel Primary Care: Jiles Prows, NP EP: Dr. Lalla Brothers  HPI: Referred to clinic by Dr. Alvino Chapel with [facility] for heart failure consultation. 62 y.o. male with history of HFpEF, CHB s/p leadless PPM, HTN, HLD, DVT, DM II, tobacco use, ETOH abuse, diverticulitis w/ perforation s/p colectomy/ileostomy c/b wound infections.   Admitted with complete heart block in July 2022. He underwent Medtronic Micra leadless PPM. Did not get transvenous device d/t concern for infection with nonhealing wounds. Echo at that time with LVEF 65-70%. cMRI w/ LVEF 72%, RVEF 55%, no evidence of cardiac amyloidosis, LGE pattern could be consistent with cardiac sarcoidosis vs prior myocarditis. High Res Chest CT with no evidence of pulmonary sarcoid.   He was admitted 07/12-07/21/24 with acute hypoxic and hypercarbic respiratory failure in setting of PNA w/ COPD and diastolic CHF. Required mechanical ventilation. Course further c/b delirium w/ alcohol withdrawal,  management of nonhealing abdominal wounds and dysphagia. He required CorTrak during the admission. Echo with LVEF 50-55%, GIIDD, RV okay  Presented 02/15/23 with worsening dyspnea and cough and admitted for acute respiratory failure 2/2 AECOPD and a/c HFpEF. Treated with steroids and antibiotics. Had been eating a lot of sodium at home. Cardiology consulted and he was diuresed with IV lasix. Discharged home on 2L O2.  Here today for hospital follow-up. He was seen in the ED yesterday for bleeding from his abdominal wound. Bleeding has stopped but this has caused him a lot of stress. Has chronic dyspnea with exertion. PCP recently referred him to Pulmonary to workup for COPD. No orthopnea, PND or lower extremity edema. Weight at discharge was 226 lb, has been 227-228 lb at home. Reports taking medications as prescribed. Has been watching sodium intake, trying to eat fresh fruits and vegetables.    Drinks 1-2 pints of vodka a week, smokes 1/4 to 1/2 ppd    Past Medical History:  Diagnosis Date   Acute respiratory failure with hypoxia (HCC) 12/23/2022   Alcohol withdrawal syndrome, with delirium (HCC) 12/27/2022   Diabetes mellitus without complication (HCC)    Diverticulitis    DVT (deep venous thrombosis) (HCC)    x3   Encephalopathy acute 12/28/2022   ETOH abuse    H/O colectomy    Hyperlipidemia    Hypertension    Sepsis associated hypotension (HCC) 08/03/2019   Smoker     Current Outpatient Medications  Medication Sig Dispense Refill   acetaminophen (TYLENOL) 650 MG CR tablet Take 1,300 mg by mouth every 8 (eight) hours as needed for pain.     amLODipine (NORVASC) 10 MG tablet Take 1 tablet (10 mg total) by mouth daily. 90 tablet 0   atorvastatin (LIPITOR) 10 MG tablet Take 10 mg by mouth daily.     cetirizine (ZYRTEC) 10 MG tablet Take 10 mg by mouth daily.     dapagliflozin propanediol (FARXIGA) 10 MG TABS tablet Take 1 tablet (10 mg total) by mouth daily before breakfast. 90 tablet 2   fluticasone (FLOVENT HFA) 110 MCG/ACT inhaler Inhale 2 puffs into the lungs 2 (two) times daily. 1 each 2   folic acid (FOLVITE) 1 MG tablet TAKE 1 TABLET(1 MG) BY MOUTH DAILY 90 tablet 0   hydrOXYzine (VISTARIL) 50 MG capsule Take 1 capsule (50 mg total) by mouth at bedtime as needed. 30 capsule 2   ipratropium-albuterol (DUONEB) 0.5-2.5 (3) MG/3ML SOLN Take 3 mLs by nebulization every 6 (six) hours  as needed. 360 mL 0   losartan (COZAAR) 25 MG tablet Take 1 tablet (25 mg total) by mouth daily. 30 tablet 5   metFORMIN (GLUCOPHAGE) 500 MG tablet Take 2 tablets by mouth with breakfast and 1 tablet by mouth at dinner 90 tablet 6   metoprolol succinate (TOPROL-XL) 50 MG 24 hr tablet Take 1 tablet (50 mg total) by mouth daily. Take with or immediately following a meal. 30 tablet 1   Multiple Vitamin (MULTIVITAMIN WITH MINERALS) TABS tablet Take 1 tablet by mouth every other day.       predniSONE (DELTASONE) 10 MG tablet Take 4 tabs for 3 days, then 3 tabs for 3 days, then 2 tabs for 3 days, then 1 tab for 3 days, then 1/2 tab for 4 days. 32 tablet 0   thiamine (VITAMIN B1) 100 MG tablet Take 1 tablet (100 mg total) by mouth daily. 90 tablet 0   torsemide (DEMADEX) 20 MG tablet Take 1 tablet (20 mg total) by mouth daily. 30 tablet 1   LOVAZA 1 g capsule Take 1 capsule (1 g total) by mouth daily. (Patient not taking: Reported on 03/06/2023) 90 capsule 2   No current facility-administered medications for this encounter.    Allergies  Allergen Reactions   Lisinopril     Other reaction(s): renal effects   Codeine Other (See Comments) and Hives    unknown Other reaction(s): Other (See Comments) unknown unknown      Social History   Socioeconomic History   Marital status: Single    Spouse name: Not on file   Number of children: Not on file   Years of education: Not on file   Highest education level: Not on file  Occupational History   Occupation: Pt states not able to work  Tobacco Use   Smoking status: Every Day    Current packs/day: 1.00    Average packs/day: 1 pack/day for 44.0 years (44.0 ttl pk-yrs)    Types: Cigarettes   Smokeless tobacco: Current  Vaping Use   Vaping status: Never Used  Substance and Sexual Activity   Alcohol use: Yes    Comment: 1-24 pt drinks 5 drinks a week   Drug use: Yes    Types: Marijuana   Sexual activity: Not Currently  Other Topics Concern   Not on file  Social History Narrative   Not on file   Social Determinants of Health   Financial Resource Strain: Not on file  Food Insecurity: No Food Insecurity (02/17/2023)   Hunger Vital Sign    Worried About Running Out of Food in the Last Year: Never true    Ran Out of Food in the Last Year: Never true  Transportation Needs: No Transportation Needs (02/17/2023)   PRAPARE - Administrator, Civil Service (Medical): No    Lack of Transportation (Non-Medical): No   Physical Activity: Not on file  Stress: Not on file  Social Connections: Not on file  Intimate Partner Violence: Not At Risk (02/17/2023)   Humiliation, Afraid, Rape, and Kick questionnaire    Fear of Current or Ex-Partner: No    Emotionally Abused: No    Physically Abused: No    Sexually Abused: No      Family History  Problem Relation Age of Onset   COPD Mother    Prostate cancer Father    Colon cancer Neg Hx    Rectal cancer Neg Hx    Stomach cancer Neg Hx    Esophageal  cancer Neg Hx     Vitals:   03/06/23 1033  BP: (!) 148/98  Pulse: 96  SpO2: 95%  Weight: 103.5 kg (228 lb 3.2 oz)    PHYSICAL EXAM: General:  Well appearing.  HEENT: normal Neck: supple. no JVD. Carotids 2+ bilat; no bruits.  Cor: PMI nondisplaced. Regular rate & rhythm. No rubs, gallops or murmurs. Lungs: Diminished Abdomen: Obese. + large abdominal hernia, + ostomy Extremities: no cyanosis, clubbing, rash, edema Neuro: alert & oriented x 3. Affect pleasant.   ASSESSMENT & PLAN: HFpEF -cMRI 07/22: LVEF 72%, RVEF 55%, no evidence of cardiac amyloidosis, LGE pattern could be consistent with cardiac sarcoidosis vs prior myocarditis.  -Echo 07/24: LVEF 50-55%, GIIDD, RV okay -NYHA III. Difficult to assess as suspect undiagnosed COPD contributing to dyspnea and abdominal pain impairs his mobility -Volume stable on exam. Continue 20 mg Torsemide daily -Continue Farxiga 10 mg daily -On Toprol XL 50 daily (does not necessarily need for HFpEF). Could consider stopping at future visit -Add Losartan 25 mg daily -Labs today -Has not completed cardiac PET to rule out cardiac sarcoid, ordered today. No evidence of pulmonary sarcoid on HiRes CT in 2022. -PCP has referred him for sleep study  CHB  -s/p micra leadless PPM in July 2022 -Work up for cardiac sarcoid as above  DM II -Last A1c was 7.0 -Continue Farxiga -Per PCP  HTN -BP elevated -Meds adjusted as above  Hx colectomy/ileostomy Chronic  abdominal wound Abdominal hernia -Following with Ostomy clinic. Wounds have been healing. -He is hoping to undergo ileostomy reversal  -Had been seen by surgery at [facility] for hernia. Has not completed the recommended CT scan and would need to quit smoking for surgery.  ETOH abuse -1-2 pints of vodka a week -He is aware he needs to cut back ETOH intake  Tobacco abuse -1/4-1/2 ppd for many years -Discussed cessation. Does not appear ready to quit.   Referred to HFSW (PCP, Medications, Transportation, ETOH Abuse, Drug Abuse, Insurance, Financial ): No Refer to Pharmacy: No Refer to Home Health: No Refer to Advanced Heart Failure Clinic: Yes Refer to General Cardiology: No  Follow up  Dr. Gasper Lloyd in 4 weeks to review workup for cardiac sarcoid. If workup negative, he can likely f/u with AHF as needed and be referred to Cardiology

## 2023-03-06 NOTE — Patient Instructions (Addendum)
Labs done today. We will contact you only if your labs are abnormal.  START Losartan 25mg  (1 tablet) by mouth daily.   No other medication changes were made. Please continue all current medications as prescribed.  Your provider has recommended you have a Cardiac PET Scan at [facility]. We will get this approved with your insurance company and get it scheduled for you. We will call you with the date and time and instructions.   Your physician recommends that you schedule a follow-up appointment in: 2 weeks for a lab only appointment and in 3-4 weeks with Dr. Gasper Lloyd here in our office.   If you have any questions or concerns before your next appointment please send [place] a message through [place] or call our office at [phone].    TO LEAVE A MESSAGE FOR THE NURSE SELECT OPTION 2, PLEASE LEAVE A MESSAGE INCLUDING: YOUR NAME DATE OF BIRTH CALL BACK NUMBER REASON FOR CALL**this is important as we prioritize the call backs  YOU WILL RECEIVE A CALL BACK THE SAME DAY AS LONG AS YOU CALL BEFORE 4:00 PM   Do the following things EVERYDAY: Weigh yourself in the morning before breakfast. Write it down and keep it in a log. Take your medicines as prescribed Eat low salt foods--Limit salt (sodium) to 2000 mg per day.  Stay as active as you can everyday Limit all fluids for the day to less than 2 liters   At the [facility], you and your health needs are our priority. As part of our continuing mission to provide you with exceptional heart care, we have created designated Provider Care Teams. These Care Teams include your primary Cardiologist (physician) and Advanced Practice Providers (APPs- Physician Assistants and Nurse Practitioners) who all work together to provide you with the care you need, when you need it.   You may see any of the following providers on your designated Care Team at your next follow up: Dr Arvilla Meres Dr Marca Ancona Dr. Marcos Eke, NP Robbie Lis, [place] [facility] [place], [place] Brynda Peon, NP Karle Plumber, PharmD   Please be sure to bring in all your medications bottles to every appointment.    Thank you for choosing [facility]

## 2023-03-06 NOTE — Progress Notes (Signed)
[facility]   Reason for visit:  RLq ileostomy HPI:  Diverticulitis with perforation and end ileostomy   Past Medical History:  Diagnosis Date   Acute respiratory failure with hypoxia (HCC) 12/23/2022   Alcohol withdrawal syndrome, with delirium (HCC) 12/27/2022   Diabetes mellitus without complication (HCC)    Diverticulitis    DVT (deep venous thrombosis) (HCC)    x3   Encephalopathy acute 12/28/2022   ETOH abuse    H/O colectomy    Hyperlipidemia    Hypertension    Sepsis associated hypotension (HCC) 08/03/2019   Smoker    Family History  Problem Relation Age of Onset   COPD Mother    Prostate cancer Father    Colon cancer Neg Hx    Rectal cancer Neg Hx    Stomach cancer Neg Hx    Esophageal cancer Neg Hx    Allergies  Allergen Reactions   Lisinopril     Other reaction(s): renal effects   Codeine Other (See Comments) and Hives    unknown Other reaction(s): Other (See Comments) unknown unknown   Current Outpatient Medications  Medication Sig Dispense Refill Last Dose   acetaminophen (TYLENOL) 650 MG CR tablet Take 1,300 mg by mouth every 8 (eight) hours as needed for pain.      amLODipine (NORVASC) 10 MG tablet Take 1 tablet (10 mg total) by mouth daily. 90 tablet 0    atorvastatin (LIPITOR) 10 MG tablet Take 10 mg by mouth daily.      cetirizine (ZYRTEC) 10 MG tablet Take 10 mg by mouth daily.      dapagliflozin propanediol (FARXIGA) 10 MG TABS tablet Take 1 tablet (10 mg total) by mouth daily before breakfast. 90 tablet 2    fluticasone (FLOVENT HFA) 110 MCG/ACT inhaler Inhale 2 puffs into the lungs 2 (two) times daily. 1 each 2    folic acid (FOLVITE) 1 MG tablet TAKE 1 TABLET(1 MG) BY MOUTH DAILY 90 tablet 0    hydrOXYzine (VISTARIL) 50 MG capsule Take 1 capsule (50 mg total) by mouth at bedtime as needed. 30 capsule 2    ipratropium-albuterol (DUONEB) 0.5-2.5 (3) MG/3ML SOLN Take 3 mLs by nebulization every 6 (six) hours as needed. 360 mL 0     losartan (COZAAR) 25 MG tablet Take 1 tablet (25 mg total) by mouth daily. 30 tablet 5    LOVAZA 1 g capsule Take 1 capsule (1 g total) by mouth daily. (Patient not taking: Reported on 03/06/2023) 90 capsule 2    metFORMIN (GLUCOPHAGE) 500 MG tablet Take 2 tablets by mouth with breakfast and 1 tablet by mouth at dinner 90 tablet 6    metoprolol succinate (TOPROL-XL) 50 MG 24 hr tablet Take 1 tablet (50 mg total) by mouth daily. Take with or immediately following a meal. 30 tablet 1    Multiple Vitamin (MULTIVITAMIN WITH MINERALS) TABS tablet Take 1 tablet by mouth every other day.       predniSONE (DELTASONE) 10 MG tablet Take 4 tabs for 3 days, then 3 tabs for 3 days, then 2 tabs for 3 days, then 1 tab for 3 days, then 1/2 tab for 4 days. 32 tablet 0    thiamine (VITAMIN B1) 100 MG tablet Take 1 tablet (100 mg total) by mouth daily. 90 tablet 0    torsemide (DEMADEX) 20 MG tablet Take 1 tablet (20 mg total) by mouth daily. 30 tablet 1    No current facility-administered medications for this encounter.  ROS  Review of Systems  Constitutional:  Positive for fatigue.  HENT:  Positive for postnasal drip.   Respiratory:  Positive for shortness of breath.        CHF Respiratory failure, recent  Gastrointestinal:        Abdominal hernia Nonhealing surgical wound Ileostomy   Musculoskeletal:  Positive for gait problem.       Uses wheelchair  Psychiatric/Behavioral:  Positive for agitation.        States he was very concerned with bleeding wound.  Wants to come twice weekly to check.  Assured him that is not necessary.    All other systems reviewed and are negative.  Vital signs:  BP (!) 148/98   Pulse 96   Temp 98.2 F (36.8 C) (Oral)   Resp 20   SpO2 95%  Exam:  Physical Exam Vitals reviewed.  Constitutional:      Appearance: He is obese.  HENT:     Mouth/Throat:     Mouth: Mucous membranes are moist.  Pulmonary:     Breath sounds: Wheezing present.  Abdominal:     Hernia: A  hernia is present.  Skin:    General: Skin is warm and dry.     Findings: Lesion and rash present.  Neurological:     Mental Status: He is alert and oriented to person, place, and time.  Psychiatric:        Mood and Affect: Mood normal.        Behavior: Behavior normal.     Stoma type/location:  RLQ ileostomy Stomal assessment/size:  1" flush Peristomal assessment:  denuded skin along bottom half  had left previous pouch on since last visit (1 week)  Abdominal wound had been improving.  Over the weekend, made forceful contact with cabinet surface when getting in shower resulting in bleeding from wound.  He reported to ED and bleeding stopped.  He refused to stay in ED for observation or bloodwork Treatment options for stomal/peristomal skin: stoma powder and skin prep around ostomy Output: liquid tan stool Ostomy pouching: 1pc Education provided:  We perform wound care and wound is not bleeding. I see one open area that looks fresh and was the likely source.  Some bloody effluent on silver dressings.  No need for silver nitrate or cautery.     Impression/dx  Ileostomy Trauma to surgical wound Discussion  See back 2 weeks  Plan  Continue silver dressing to open wound and pouching as normal.     Visit time: 45 minutes.   Maple Hudson FNP-BC

## 2023-03-06 NOTE — Discharge Instructions (Signed)
Continue silver dressings to abdomen Continue 1 piece pouch with barrier ring

## 2023-03-06 NOTE — Addendum Note (Signed)
Encounter addended by: Andrey Farmer, PA-C on: 03/06/2023 3:10 PM  Actions taken: Visit diagnoses modified, Clinical Note Signed

## 2023-03-07 ENCOUNTER — Other Ambulatory Visit: Payer: Medicaid Other

## 2023-03-07 DIAGNOSIS — K469 Unspecified abdominal hernia without obstruction or gangrene: Secondary | ICD-10-CM | POA: Diagnosis not present

## 2023-03-07 DIAGNOSIS — T8189XA Other complications of procedures, not elsewhere classified, initial encounter: Secondary | ICD-10-CM | POA: Diagnosis not present

## 2023-03-07 DIAGNOSIS — E785 Hyperlipidemia, unspecified: Secondary | ICD-10-CM

## 2023-03-07 NOTE — Progress Notes (Unsigned)
03/07/2023 Name: Luis [name] MRN: [id number] DOB: 23-Nov-1960  No chief complaint on file.   Luis [name] is a 62 y.o. year old male who presented for a telephone visit.   They were referred to the pharmacist by their PCP for assistance in managing complex medication management.    Subjective:  Care Team: Primary Care Provider: [name], NP ; Next Scheduled Visit: 04/27/2023 Cardiologist: Dr. Gasper Lloyd; Next Scheduled Visit: 03/29/2023  Medication Access/Adherence  Current Pharmacy:  [id number] [place], [place] - [address] AT [facility] [address] [facility] [address] [place] [address] Phone: [phone] Fax: [phone]   Patient reports affordability concerns with their medications: No  Patient reports access/transportation concerns to their pharmacy: Yes  (does not have a car, usually gets a friend to pick up medication) Patient reports adherence concerns with their medications:  No    Pt reports he has not gotten Lovaza because it is requiring authorization. Hasn't gotten losartan yet as it was just prescribed yesterday.  Diabetes:  Current medications: Farxiga 10 mg daily, metformin 500 mg 2 tabs qAM and 1 tab qPM   Current glucose readings: not checking BG   Patient denies hypoglycemic s/sx including dizziness, shakiness, sweating.  Patient reports hyperglycemic symptoms including polydipsia.  Current meal patterns: cutting back on soda, diet soda. Has been drinking a fruit smoothie with whey daily.    Current physical activity: no formal exercise, does work around the house  Current medication access support: has Medicaid  Heart Failure:  Current medications:  ACEi/ARB/ARNI: Losartan (not yet prescribed) SGLT2i: Farxiga 10 mg daily Beta blocker: Metoprolol succinate 50 mg daily Mineralocorticoid Receptor Antagonist: n/a Diuretic regimen: Torsemide 20 mg daily  Current home blood pressure readings: has not  checked Current home weights: 225.5 this morning. Typically 226-228 lbs  Patient denies volume overload signs or symptoms including shortness of breath, lower extremity edema, increased use of pillows at night   Objective:  Lab Results  Component Value Date   HGBA1C 7.0 (H) 02/16/2023    Lab Results  Component Value Date   CREATININE 1.23 03/06/2023   BUN 32 (H) 03/06/2023   NA 133 (L) 03/06/2023   K 4.2 03/06/2023   CL 93 (L) 03/06/2023   CO2 26 03/06/2023    Lab Results  Component Value Date   CHOL 127 11/04/2022   HDL 32.50 (L) 11/04/2022   LDLCALC 53 12/26/2020   LDLDIRECT 51.0 11/04/2022   TRIG 522 (H) 12/26/2022   CHOLHDL 4 11/04/2022    Medications Reviewed Today   Medications were not reviewed in this encounter       Assessment/Plan:   Med Access: Recommended utilizing a pharmacy that delivers meds at no additional cost. Pt would like to switch to using [employer] for mail order.  Diabetes: - Currently controlled, goal A1c <7.0% - Reviewed long term cardiovascular and renal outcomes of uncontrolled blood sugar - Reviewed dietary modifications including focusing on increased fiber and protein and balanced meals/snacks. - No changes recommended at this time   Heart Failure: - Currently appropriately managed - Reviewed to weigh daily and when to contact cardiology with weight gain - Reviewed dietary modifications including limiting processed/box foods when possible. Encouraged low sodium foods such as fresh or frozen fruits/veggies. If using canned veggies, recommend rinsing. Utilize no salt added spices for food preparation.  - Called [facility] with patient to determine losartan is ready for refill and Lovaza  is requiring approval. Was able to see that Medicaid prefers Vascepa, will send request to change Lovaza to Vascepa.   Follow Up Plan: will work on setting up [facility] mail order and getting Vascepa, then will set f/u  visit  Arbutus Leas, PharmD, BCPS [facility] [phone]

## 2023-03-07 NOTE — Patient Instructions (Addendum)
It was a pleasure speaking with you today!  Continue all current medications and start losartan 25  mg daily that was just prescribed by cardiology.  I am working on getting your Lovaza prescription switched to Vascepa, which is the preferred under Medicaid.  Be sure to contact your cardiologist if you experience a 3 lb weight gain in one day or 5 lbs in one week or if you have increase in shortness of breath or swelling in your legs/feet.  I will work on getting your medications switched to [employer] to utilize their mail order service.  Feel free to reach out using my direct number below for questions or concerns.  Arbutus Leas, PharmD, BCPS [facility] [phone]

## 2023-03-09 ENCOUNTER — Other Ambulatory Visit: Payer: Self-pay | Admitting: Pharmacist

## 2023-03-09 ENCOUNTER — Other Ambulatory Visit (HOSPITAL_COMMUNITY): Payer: Self-pay

## 2023-03-09 DIAGNOSIS — I5042 Chronic combined systolic (congestive) and diastolic (congestive) heart failure: Secondary | ICD-10-CM

## 2023-03-09 DIAGNOSIS — I10 Essential (primary) hypertension: Secondary | ICD-10-CM

## 2023-03-09 DIAGNOSIS — E1169 Type 2 diabetes mellitus with other specified complication: Secondary | ICD-10-CM

## 2023-03-09 MED ORDER — ICOSAPENT ETHYL 1 G PO CAPS
2.0000 g | ORAL_CAPSULE | Freq: Two times a day (BID) | ORAL | 5 refills | Status: DC
Start: 2023-03-09 — End: 2023-03-15
  Filled 2023-03-09 – 2023-03-15 (×3): qty 120, 30d supply, fill #0

## 2023-03-09 NOTE — Telephone Encounter (Signed)
Sending patient's medications to [facility] to utilize mail order to make it easier for patient to get medications.  Sending refills for Rxs prescribed by PCP. Will ask cardio to send losartan and torsemide.  Arbutus Leas, PharmD, BCPS [facility] [phone]

## 2023-03-10 ENCOUNTER — Other Ambulatory Visit: Payer: Self-pay

## 2023-03-10 ENCOUNTER — Other Ambulatory Visit (HOSPITAL_COMMUNITY): Payer: Self-pay

## 2023-03-10 ENCOUNTER — Encounter: Payer: Self-pay | Admitting: Pharmacist

## 2023-03-10 MED ORDER — METOPROLOL SUCCINATE ER 50 MG PO TB24
50.0000 mg | ORAL_TABLET | Freq: Every day | ORAL | 0 refills | Status: DC
Start: 2023-03-10 — End: 2023-03-29
  Filled 2023-03-10: qty 90, 90d supply, fill #0

## 2023-03-10 MED ORDER — DAPAGLIFLOZIN PROPANEDIOL 10 MG PO TABS
10.0000 mg | ORAL_TABLET | Freq: Every day | ORAL | 2 refills | Status: DC
  Filled 2023-03-10 – 2023-06-08 (×2): qty 90, 90d supply, fill #0
  Filled 2023-09-13: qty 90, 90d supply, fill #1
  Filled 2023-12-29 (×2): qty 90, 90d supply, fill #2

## 2023-03-10 MED ORDER — METFORMIN HCL 500 MG PO TABS
ORAL_TABLET | ORAL | 6 refills | Status: DC
  Filled 2023-03-10 – 2023-03-27 (×2): qty 90, 30d supply, fill #0
  Filled 2023-05-04: qty 90, 30d supply, fill #1

## 2023-03-10 MED ORDER — AMLODIPINE BESYLATE 10 MG PO TABS
10.0000 mg | ORAL_TABLET | Freq: Every day | ORAL | 0 refills | Status: DC
Start: 2023-03-10 — End: 2023-08-17
  Filled 2023-03-10 – 2023-05-13 (×2): qty 90, 90d supply, fill #0

## 2023-03-10 MED ORDER — ATORVASTATIN CALCIUM 10 MG PO TABS
10.0000 mg | ORAL_TABLET | Freq: Every day | ORAL | 0 refills | Status: DC
Start: 2023-03-10 — End: 2023-09-04
  Filled 2023-03-10 – 2023-05-22 (×2): qty 90, 90d supply, fill #0

## 2023-03-10 NOTE — Telephone Encounter (Signed)
Sending patient's medications to [facility] to utilize mail order to make it easier for patient to get medications.   Sending refills for Rxs prescribed by PCP.   Spoke to patient, he confirmed he picked up losartan and that he talked to [facility] for payment info. Confirmed he'd like to proceed getting all meds from [facility] mail order.   Arbutus Leas, PharmD, BCPS [facility] [phone]

## 2023-03-11 ENCOUNTER — Other Ambulatory Visit (HOSPITAL_COMMUNITY): Payer: Self-pay

## 2023-03-13 ENCOUNTER — Ambulatory Visit (HOSPITAL_COMMUNITY)
Admission: RE | Admit: 2023-03-13 | Discharge: 2023-03-13 | Disposition: A | Discharge status: Home / Self Care | Payer: Medicaid Other | Source: Ambulatory Visit | Attending: Nurse Practitioner | Admitting: Nurse Practitioner

## 2023-03-13 ENCOUNTER — Other Ambulatory Visit (HOSPITAL_COMMUNITY): Payer: Self-pay

## 2023-03-13 DIAGNOSIS — Z932 Ileostomy status: Secondary | ICD-10-CM | POA: Diagnosis not present

## 2023-03-13 DIAGNOSIS — K469 Unspecified abdominal hernia without obstruction or gangrene: Secondary | ICD-10-CM | POA: Insufficient documentation

## 2023-03-13 DIAGNOSIS — Z87891 Personal history of nicotine dependence: Secondary | ICD-10-CM | POA: Diagnosis not present

## 2023-03-13 DIAGNOSIS — Z432 Encounter for attention to ileostomy: Secondary | ICD-10-CM | POA: Diagnosis not present

## 2023-03-13 DIAGNOSIS — Z7689 Persons encountering health services in other specified circumstances: Secondary | ICD-10-CM | POA: Diagnosis not present

## 2023-03-13 DIAGNOSIS — K4090 Unilateral inguinal hernia, without obstruction or gangrene, not specified as recurrent: Secondary | ICD-10-CM

## 2023-03-13 DIAGNOSIS — L24B3 Irritant contact dermatitis related to fecal or urinary stoma or fistula: Secondary | ICD-10-CM | POA: Diagnosis not present

## 2023-03-13 MED ORDER — VITAMIN C 500 MG PO TABS: 500.0000 mg | ORAL_TABLET | Freq: Every day | ORAL | 1 refills | Status: AC

## 2023-03-13 MED ORDER — INSULIN GLARGINE-YFGN 100 UNIT/ML ~~LOC~~ SOLN: 20.0000 [IU] | Freq: Two times a day (BID) | SUBCUTANEOUS | 11 refills | Status: DC

## 2023-03-13 MED ORDER — TORSEMIDE 20 MG PO TABS
20.0000 mg | ORAL_TABLET | Freq: Every day | ORAL | 1 refills | Status: DC
  Filled 2023-03-15 (×2): qty 30, 30d supply, fill #0

## 2023-03-13 MED ORDER — DAPAGLIFLOZIN PROPANEDIOL 10 MG PO TABS: 10.0000 mg | ORAL_TABLET | Freq: Every day | ORAL | 2 refills | Status: DC

## 2023-03-13 MED ORDER — ACETAMINOPHEN ER 650 MG PO TBCR: 650.0000 mg | EXTENDED_RELEASE_TABLET | Freq: Three times a day (TID) | ORAL | 1 refills | Status: DC | PRN

## 2023-03-13 MED ORDER — TRIAMCINOLONE ACETONIDE 0.5 % EX CREA
TOPICAL_CREAM | CUTANEOUS | 1 refills | Status: DC
  Filled 2023-06-16: qty 30, 30d supply, fill #0

## 2023-03-13 MED ORDER — BUPROPION HCL ER (SR) 150 MG PO TB12: 150.0000 mg | ORAL_TABLET | Freq: Two times a day (BID) | ORAL | 2 refills | Status: DC

## 2023-03-13 MED ORDER — THIAMINE HCL 100 MG PO TABS: 100.0000 mg | ORAL_TABLET | Freq: Every day | ORAL | 0 refills | Status: DC

## 2023-03-13 NOTE — Progress Notes (Signed)
[facility]   Reason for visit:  RLQ ileostomy Abdominal hernia Surgical wound, improved HPI:  Perforated diverticulum with end ileostomy Large abdominal hernia Past Medical History:  Diagnosis Date   Acute respiratory failure with hypoxia (HCC) 12/23/2022   Alcohol withdrawal syndrome, with delirium (HCC) 12/27/2022   Diabetes mellitus without complication (HCC)    Diverticulitis    DVT (deep venous thrombosis) (HCC)    x3   Encephalopathy acute 12/28/2022   ETOH abuse    H/O colectomy    Hyperlipidemia    Hypertension    Sepsis associated hypotension (HCC) 08/03/2019   Smoker    Family History  Problem Relation Age of Onset   COPD Mother    Prostate cancer Father    Colon cancer Neg Hx    Rectal cancer Neg Hx    Stomach cancer Neg Hx    Esophageal cancer Neg Hx    Allergies  Allergen Reactions   Lisinopril     Other reaction(s): renal effects   Codeine Other (See Comments) and Hives    unknown Other reaction(s): Other (See Comments) unknown unknown   Current Outpatient Medications  Medication Sig Dispense Refill Last Dose   acetaminophen (ACETAMINOPHEN 8 HOUR) 650 MG CR tablet Take 1 tablet (650 mg total) by mouth every 8 (eight) hours as needed for pain 90 tablet 1    acetaminophen (TYLENOL) 650 MG CR tablet Take 1,300 mg by mouth every 8 (eight) hours as needed for pain.      amLODipine (NORVASC) 10 MG tablet Take 1 tablet (10 mg total) by mouth daily. 90 tablet 0    ascorbic acid (VITAMIN C) 500 MG tablet Take 1 tablet (500 mg total) by mouth daily. 30 tablet 1    atorvastatin (LIPITOR) 10 MG tablet Take 1 tablet (10 mg total) by mouth daily. 90 tablet 0    buPROPion (WELLBUTRIN SR) 150 MG 12 hr tablet Take 1 tablet (150 mg total) by mouth 2 (two) times daily. 60 tablet 2    cetirizine (ZYRTEC) 10 MG tablet Take 10 mg by mouth daily.      dapagliflozin propanediol (FARXIGA) 10 MG TABS tablet Take 1 tablet (10 mg total) by mouth daily before  breakfast. 90 tablet 2    dapagliflozin propanediol (FARXIGA) 10 MG TABS tablet Take 1 tablet (10 mg total) by mouth daily before breakfast. 30 tablet 2    fluticasone (FLOVENT HFA) 110 MCG/ACT inhaler Inhale 2 puffs into the lungs 2 (two) times daily. 1 each 2    folic acid (FOLVITE) 1 MG tablet TAKE 1 TABLET(1 MG) BY MOUTH DAILY 90 tablet 0    hydrOXYzine (VISTARIL) 50 MG capsule Take 1 capsule (50 mg total) by mouth at bedtime as needed. 30 capsule 2    icosapent Ethyl (VASCEPA) 1 g capsule Take 2 capsules (2 g total) by mouth 2 (two) times daily. 120 capsule 5    insulin glargine-yfgn (SEMGLEE) 100 UNIT/ML injection Inject 0.2 mLs (20 Units total) into the skin 2 (two) times daily. 10 mL 11    ipratropium-albuterol (DUONEB) 0.5-2.5 (3) MG/3ML SOLN Take 3 mLs by nebulization every 6 (six) hours as needed. 360 mL 0    losartan (COZAAR) 25 MG tablet Take 1 tablet (25 mg total) by mouth daily. (Patient not taking: Reported on 03/07/2023) 30 tablet 5    metFORMIN (GLUCOPHAGE) 500 MG tablet Take 2 tablets (1,000 mg total) by mouth daily with breakfast AND 1 tablet (500 mg total) daily with  supper. 90 tablet 6    metoprolol succinate (TOPROL-XL) 50 MG 24 hr tablet Take 1 tablet (50 mg total) by mouth daily. Take with or immediately following a meal. 90 tablet 0    Multiple Vitamin (MULTIVITAMIN WITH MINERALS) TABS tablet Take 1 tablet by mouth every other day.       predniSONE (DELTASONE) 10 MG tablet Take 4 tabs for 3 days, then 3 tabs for 3 days, then 2 tabs for 3 days, then 1 tab for 3 days, then 1/2 tab for 4 days. 32 tablet 0    thiamine (VITAMIN B1) 100 MG tablet Take 1 tablet (100 mg total) by mouth daily. 90 tablet 0    thiamine (VITAMIN B1) 100 MG tablet Take 1 tablet (100 mg total) by mouth daily. 90 tablet 0    torsemide (DEMADEX) 20 MG tablet Take 1 tablet (20 mg total) by mouth daily. 30 tablet 1    torsemide (DEMADEX) 20 MG tablet Take 1 tablet (20 mg total) by mouth daily. 30 tablet 1     triamcinolone cream (KENALOG) 0.5 % Apply topically to legs twice daily for up to 2 weeks as needed for eczema 30 g 1    No current facility-administered medications for this encounter.   ROS  Review of Systems  Respiratory:  Positive for shortness of breath.   Cardiovascular:        CHF, stable  recent hospitalization  Gastrointestinal:        RLQ ileostomy Abdominal hernia  Musculoskeletal:  Positive for gait problem.       Uses wheelchair in hospital  Skin:  Positive for color change, rash and wound.  Psychiatric/Behavioral: Negative.    All other systems reviewed and are negative.  Vital signs:  BP 126/77 (BP Location: Right Arm)   Pulse 93   Temp 98.6 F (37 C) (Oral)   Resp 20   SpO2 95%  Exam:  Physical Exam Vitals reviewed.  Constitutional:      Appearance: He is obese.  Cardiovascular:     Comments: Pacemaker CHF Pulmonary:     Breath sounds: Wheezing present.     Comments: Smoker  7.5 pack years Abdominal:     Hernia: A hernia is present.     Comments: Abdominal hernia Nonhealing midline surgical wound, improved  Skin:    General: Skin is warm and dry.     Findings: Erythema present.     Comments: Contact dermatitis to periwound skin on abdomen due to medical adhesive. Improving with daily VASHE use to skin.   Neurological:     General: No focal deficit present.     Mental Status: He is alert and oriented to person, place, and time.  Psychiatric:        Mood and Affect: Mood normal.        Behavior: Behavior normal.     Comments: Daily ETOH use     Stoma type/location:  RLQ ileostomy, flush pink and moist Stomal assessment/size:  1" flush Peristomal assessment:  intact  tender to touch.  No erythema noted Treatment options for stomal/peristomal skin: 1 piece flat , barrier ring .  Uses skin Tac adhesive Output: soft yellow stool Ostomy pouching: 1pc. Flat  Education provided:  pouch change performed.  Wound care performed.  Wound is much smaller   photo in chart,   Periwound skin is dramatically better with VASHE washe.     Impression/dx  Ileostomy Nonhealing wound  Abdominal hernia Discussion  See back as  needed.  Plan  Follow up in clinic with stomal or wound issues.  COntinue to pursue cessation of smoking and drinking.      Visit time: 45 minutes.   Maple Hudson FNP-BC

## 2023-03-14 ENCOUNTER — Other Ambulatory Visit: Payer: Self-pay

## 2023-03-14 ENCOUNTER — Other Ambulatory Visit (HOSPITAL_COMMUNITY): Payer: Self-pay

## 2023-03-14 ENCOUNTER — Encounter: Payer: Self-pay | Admitting: Podiatry

## 2023-03-14 ENCOUNTER — Ambulatory Visit (INDEPENDENT_AMBULATORY_CARE_PROVIDER_SITE_OTHER): Payer: Medicaid Other | Admitting: Podiatry

## 2023-03-14 ENCOUNTER — Encounter (HOSPITAL_COMMUNITY): Payer: Self-pay

## 2023-03-14 DIAGNOSIS — M79675 Pain in left toe(s): Secondary | ICD-10-CM | POA: Diagnosis not present

## 2023-03-14 DIAGNOSIS — M79674 Pain in right toe(s): Secondary | ICD-10-CM | POA: Diagnosis not present

## 2023-03-14 DIAGNOSIS — B351 Tinea unguium: Secondary | ICD-10-CM | POA: Diagnosis not present

## 2023-03-14 DIAGNOSIS — E1142 Type 2 diabetes mellitus with diabetic polyneuropathy: Secondary | ICD-10-CM

## 2023-03-14 DIAGNOSIS — Z7689 Persons encountering health services in other specified circumstances: Secondary | ICD-10-CM | POA: Diagnosis not present

## 2023-03-14 DIAGNOSIS — Z419 Encounter for procedure for purposes other than remedying health state, unspecified: Secondary | ICD-10-CM | POA: Diagnosis not present

## 2023-03-14 MED ORDER — METFORMIN HCL 500 MG PO TABS
ORAL_TABLET | ORAL | 6 refills | Status: DC
  Filled 2023-03-14: qty 90, 30d supply, fill #0

## 2023-03-14 MED ORDER — GABAPENTIN 300 MG PO CAPS
300.0000 mg | ORAL_CAPSULE | Freq: Every day | ORAL | 3 refills | Status: DC
  Filled 2023-03-14 – 2023-03-15 (×2): qty 90, 90d supply, fill #0
  Filled 2023-06-01 (×2): qty 90, 90d supply, fill #1

## 2023-03-14 MED ORDER — DAPAGLIFLOZIN PROPANEDIOL 10 MG PO TABS: 10.0000 mg | ORAL_TABLET | Freq: Every morning | ORAL | 2 refills | Status: DC

## 2023-03-14 MED ORDER — ARFORMOTEROL TARTRATE 15 MCG/2ML IN NEBU
2.0000 mL | INHALATION_SOLUTION | Freq: Two times a day (BID) | RESPIRATORY_TRACT | 1 refills | Status: DC
  Filled 2023-03-14: qty 120, 30d supply, fill #0

## 2023-03-14 MED ORDER — HYDROXYZINE PAMOATE 50 MG PO CAPS: 50.0000 mg | ORAL_CAPSULE | Freq: Every evening | ORAL | 2 refills | Status: DC | PRN

## 2023-03-14 MED ORDER — METOPROLOL SUCCINATE ER 50 MG PO TB24
50.0000 mg | ORAL_TABLET | Freq: Every day | ORAL | 1 refills | Status: DC
  Filled 2023-03-14: qty 30, 30d supply, fill #0

## 2023-03-14 MED ORDER — LOSARTAN POTASSIUM 25 MG PO TABS: 25.0000 mg | ORAL_TABLET | Freq: Every day | ORAL | 5 refills | Status: DC

## 2023-03-14 MED ORDER — OMEGA-3-ACID ETHYL ESTERS 1 G PO CAPS
1.0000 g | ORAL_CAPSULE | Freq: Every day | ORAL | 3 refills | Status: DC
  Filled 2023-03-14 – 2023-03-15 (×2): qty 90, 90d supply, fill #0
  Filled 2023-06-13: qty 90, 90d supply, fill #1

## 2023-03-14 NOTE — Progress Notes (Signed)
Subjective:  Patient ID: Luis [name], male    DOB: 03/11/1961,   MRN: [id number]  No chief complaint on file.   62 y.o. male presents for concern of thickened elongated and painful nails that are difficult to trim. Requesting to have them trimmed today. Relates burning and tingling in their feet. Patient is diabetic and last A1c was  Lab Results  Component Value Date   HGBA1C 7.0 (H) 02/16/2023   .  Relates neuropathy symptoms have been worsening.   PCP:  [name], NP    . Denies any other pedal complaints. Denies n/v/f/c.   Past Medical History:  Diagnosis Date   Acute respiratory failure with hypoxia (HCC) 12/23/2022   Alcohol withdrawal syndrome, with delirium (HCC) 12/27/2022   Diabetes mellitus without complication (HCC)    Diverticulitis    DVT (deep venous thrombosis) (HCC)    x3   Encephalopathy acute 12/28/2022   ETOH abuse    H/O colectomy    Hyperlipidemia    Hypertension    Sepsis associated hypotension (HCC) 08/03/2019   Smoker     Objective:  Physical Exam: Vascular: DP/PT pulses 2/4 bilateral. CFT <3 seconds. Absent hair growth on digits. Edema noted to bilateral lower extremities. Xerosis noted bilaterally.  Skin. No lacerations or abrasions bilateral feet. Nails 1-5 bilateral  are thickened discolored and elongated with subungual debris.  Musculoskeletal: MMT 5/5 bilateral lower extremities in DF, PF, Inversion and Eversion. Deceased ROM in DF of ankle joint.  Neurological: Sensation intact to light touch. Protective sensation diminished bilateral.    Assessment:   1. Pain due to onychomycosis of toenails of both feet   2. Type 2 diabetes mellitus with diabetic polyneuropathy, without long-term current use of insulin (HCC)      Plan:  Patient was evaluated and treated and all questions answered. -Discussed and educated patient on diabetic foot care, especially with  regards to the vascular, neurological and musculoskeletal systems.   -Stressed the importance of good glycemic control and the detriment of not  controlling glucose levels in relation to the foot. -Discussed supportive shoes at all times and checking feet regularly.  -Mechanically debrided all nails 1-5 bilateral using sterile nail nipper and filed with dremel without incident  -Awaiting DM shoes  -Will try gabapentin 300 mg nightly for neuropathy symptoms.   -Answered all patient questions -Patient to return  in 3 months for at risk foot care -Patient advised to call the office if any problems or questions arise in the meantime.   Louann Sjogren, DPM

## 2023-03-15 ENCOUNTER — Other Ambulatory Visit (HOSPITAL_COMMUNITY): Payer: Self-pay

## 2023-03-15 ENCOUNTER — Other Ambulatory Visit: Payer: Self-pay

## 2023-03-16 ENCOUNTER — Other Ambulatory Visit (HOSPITAL_COMMUNITY): Payer: Self-pay

## 2023-03-17 ENCOUNTER — Other Ambulatory Visit (HOSPITAL_COMMUNITY): Payer: Self-pay

## 2023-03-20 ENCOUNTER — Ambulatory Visit (HOSPITAL_BASED_OUTPATIENT_CLINIC_OR_DEPARTMENT_OTHER)
Admission: RE | Admit: 2023-03-20 | Discharge: 2023-03-20 | Disposition: A | Discharge status: Home / Self Care | Payer: Medicaid Other | Source: Ambulatory Visit | Attending: *Deleted | Admitting: *Deleted

## 2023-03-20 ENCOUNTER — Other Ambulatory Visit (HOSPITAL_COMMUNITY): Payer: Self-pay

## 2023-03-20 ENCOUNTER — Ambulatory Visit (HOSPITAL_COMMUNITY)
Admission: RE | Admit: 2023-03-20 | Discharge: 2023-03-20 | Disposition: A | Discharge status: Home / Self Care | Payer: Medicaid Other | Source: Ambulatory Visit | Attending: Nurse Practitioner | Admitting: Nurse Practitioner

## 2023-03-20 DIAGNOSIS — L24B3 Irritant contact dermatitis related to fecal or urinary stoma or fistula: Secondary | ICD-10-CM | POA: Diagnosis not present

## 2023-03-20 DIAGNOSIS — K4090 Unilateral inguinal hernia, without obstruction or gangrene, not specified as recurrent: Secondary | ICD-10-CM | POA: Diagnosis not present

## 2023-03-20 DIAGNOSIS — Y838 Other surgical procedures as the cause of abnormal reaction of the patient, or of later complication, without mention of misadventure at the time of the procedure: Secondary | ICD-10-CM | POA: Insufficient documentation

## 2023-03-20 DIAGNOSIS — I5032 Chronic diastolic (congestive) heart failure: Secondary | ICD-10-CM | POA: Diagnosis not present

## 2023-03-20 DIAGNOSIS — T8189XA Other complications of procedures, not elsewhere classified, initial encounter: Secondary | ICD-10-CM | POA: Diagnosis not present

## 2023-03-20 DIAGNOSIS — Z7689 Persons encountering health services in other specified circumstances: Secondary | ICD-10-CM | POA: Diagnosis not present

## 2023-03-20 DIAGNOSIS — K9419 Other complications of enterostomy: Secondary | ICD-10-CM | POA: Insufficient documentation

## 2023-03-20 DIAGNOSIS — Z932 Ileostomy status: Secondary | ICD-10-CM | POA: Diagnosis not present

## 2023-03-20 LAB — BASIC METABOLIC PANEL
Anion gap: 14 (ref 5–15)
BUN: 30 mg/dL — ABNORMAL HIGH (ref 8–23)
CO2: 27 mmol/L (ref 22–32)
Calcium: 9.8 mg/dL (ref 8.9–10.3)
Chloride: 95 mmol/L — ABNORMAL LOW (ref 98–111)
Creatinine, Ser: 1.35 mg/dL — ABNORMAL HIGH (ref 0.61–1.24)
GFR, Estimated: 59 mL/min — ABNORMAL LOW (ref >60)
Glucose, Bld: 245 mg/dL — ABNORMAL HIGH (ref 70–99)
Potassium: 4.3 mmol/L (ref 3.5–5.1)
Sodium: 136 mmol/L (ref 135–145)

## 2023-03-20 NOTE — Progress Notes (Signed)
[facility]   Reason for visit:  RLQ ileostomy Midline surgical wound, chronic nonhealing Improving medical adhesive related skin injury to periwound skin HPI:  Perforated diverticulum with ileostomy Abdominal hernia Past Medical History:  Diagnosis Date   Acute respiratory failure with hypoxia (HCC) 12/23/2022   Alcohol withdrawal syndrome, with delirium (HCC) 12/27/2022   Diabetes mellitus without complication (HCC)    Diverticulitis    DVT (deep venous thrombosis) (HCC)    x3   Encephalopathy acute 12/28/2022   ETOH abuse    H/O colectomy    Hyperlipidemia    Hypertension    Sepsis associated hypotension (HCC) 08/03/2019   Smoker    Family History  Problem Relation Age of Onset   COPD Mother    Prostate cancer Father    Colon cancer Neg Hx    Rectal cancer Neg Hx    Stomach cancer Neg Hx    Esophageal cancer Neg Hx    Allergies  Allergen Reactions   Lisinopril     Other reaction(s): renal effects   Codeine Other (See Comments) and Hives    unknown Other reaction(s): Other (See Comments) unknown unknown   Current Outpatient Medications  Medication Sig Dispense Refill Last Dose   acetaminophen (ACETAMINOPHEN 8 HOUR) 650 MG CR tablet Take 1 tablet (650 mg total) by mouth every 8 (eight) hours as needed for pain 90 tablet 1    acetaminophen (TYLENOL) 650 MG CR tablet Take 1,300 mg by mouth every 8 (eight) hours as needed for pain.      amLODipine (NORVASC) 10 MG tablet Take 1 tablet (10 mg total) by mouth daily. 90 tablet 0    arformoterol (BROVANA) 15 MCG/2ML NEBU Take 2 mLs (15 mcg total) by nebulization 2 (two) times daily. 120 mL 1    ascorbic acid (VITAMIN C) 500 MG tablet Take 1 tablet (500 mg total) by mouth daily. 30 tablet 1    atorvastatin (LIPITOR) 10 MG tablet Take 1 tablet (10 mg total) by mouth daily. 90 tablet 0    buPROPion (WELLBUTRIN SR) 150 MG 12 hr tablet Take 1 tablet (150 mg total) by mouth 2 (two) times daily in the morning and  evening. 60 tablet 2    cetirizine (ZYRTEC) 10 MG tablet Take 10 mg by mouth daily.      dapagliflozin propanediol (FARXIGA) 10 MG TABS tablet Take 1 tablet (10 mg total) by mouth daily before breakfast. 90 tablet 2    dapagliflozin propanediol (FARXIGA) 10 MG TABS tablet Take 1 tablet (10 mg total) by mouth daily before breakfast. 30 tablet 2    dapagliflozin propanediol (FARXIGA) 10 MG TABS tablet Take 1 tablet (10 mg total) by mouth every morning before breakfast. 90 tablet 2    fluticasone (FLOVENT HFA) 110 MCG/ACT inhaler Inhale 2 puffs into the lungs 2 (two) times daily. 1 each 2    folic acid (FOLVITE) 1 MG tablet TAKE 1 TABLET(1 MG) BY MOUTH DAILY 90 tablet 0    gabapentin (NEURONTIN) 300 MG capsule Take 1 capsule (300 mg total) by mouth at bedtime. 90 capsule 3    hydrOXYzine (VISTARIL) 50 MG capsule Take 1 capsule (50 mg total) by mouth at bedtime as needed. 30 capsule 2    hydrOXYzine (VISTARIL) 50 MG capsule Take 1 capsule (50 mg total) by mouth at bedtime as needed. 30 capsule 2    insulin glargine-yfgn (SEMGLEE) 100 UNIT/ML injection Inject 0.2 mLs (20 Units total) into the skin 2 (two) times  daily. 10 mL 11    ipratropium-albuterol (DUONEB) 0.5-2.5 (3) MG/3ML SOLN Take 3 mLs by nebulization every 6 (six) hours as needed. 360 mL 0    losartan (COZAAR) 25 MG tablet Take 1 tablet (25 mg total) by mouth daily. (Patient not taking: Reported on 03/07/2023) 30 tablet 5    losartan (COZAAR) 25 MG tablet Take 1 tablet (25 mg total) by mouth daily. 30 tablet 5    metFORMIN (GLUCOPHAGE) 500 MG tablet Take 2 tablets (1,000 mg total) by mouth daily with breakfast AND 1 tablet (500 mg total) daily with supper. 90 tablet 6    metFORMIN (GLUCOPHAGE) 500 MG tablet Take 2 tablets (1,000 mg total) by mouth daily with breakfast AND 1 tablet (500 mg total) daily with supper. 90 tablet 6    metoprolol succinate (TOPROL-XL) 50 MG 24 hr tablet Take 1 tablet (50 mg total) by mouth daily. Take with or  immediately following a meal. 90 tablet 0    metoprolol succinate (TOPROL-XL) 50 MG 24 hr tablet Take 1 tablet (50 mg total) by mouth daily with or immediately following a meal. 30 tablet 1    Multiple Vitamin (MULTIVITAMIN WITH MINERALS) TABS tablet Take 1 tablet by mouth every other day.       omega-3 acid ethyl esters (LOVAZA) 1 g capsule Take 1 capsule (1 g total) by mouth daily. 90 capsule 3    predniSONE (DELTASONE) 10 MG tablet Take 4 tabs for 3 days, then 3 tabs for 3 days, then 2 tabs for 3 days, then 1 tab for 3 days, then 1/2 tab for 4 days. 32 tablet 0    thiamine (VITAMIN B1) 100 MG tablet Take 1 tablet (100 mg total) by mouth daily. 90 tablet 0    thiamine (VITAMIN B1) 100 MG tablet Take 1 tablet (100 mg total) by mouth daily. 90 tablet 0    torsemide (DEMADEX) 20 MG tablet Take 1 tablet (20 mg total) by mouth daily. 30 tablet 1    torsemide (DEMADEX) 20 MG tablet Take 1 tablet (20 mg total) by mouth daily. 30 tablet 1    triamcinolone cream (KENALOG) 0.5 % Apply topically to legs twice daily for up to 2 weeks as needed for eczema 30 g 1    No current facility-administered medications for this encounter.   ROS  Review of Systems  Constitutional:  Positive for fatigue.  Respiratory:  Positive for shortness of breath.   Cardiovascular:        RECENT CHF exacerbation  SOB with exertion  Gastrointestinal:        RLQ ileostomy Abdominal hernia   Musculoskeletal:  Positive for gait problem.       USes wheelchair for long distances  Neurological:  Positive for numbness.       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  Vital signs:  BP 127/78 (BP Location: Right Arm)   Pulse 91   Temp 98.1 F (36.7 C) (Oral)   Resp 18   SpO2 95%  Exam:  Physical Exam Vitals reviewed.  Constitutional:      Appearance: He is obese.  Cardiovascular:     Comments: CHF Pacemaker Pulmonary:     Breath sounds: Wheezing present.     Comments: smoker Abdominal:      Palpations: Abdomen is soft.     Hernia: A hernia is present.     Comments: Nonhealing midline surgical wound  Skin:    General: Skin is warm  and dry.     Findings: Erythema and rash present.  Neurological:     Mental Status: He is alert.     Sensory: Sensory deficit present.     Comments: Neuropathy to feet  Psychiatric:        Mood and Affect: Mood normal.        Behavior: Behavior normal.     Stoma type/location:  RLQ ileostomy Stomal assessment/size:  1" flush Peristomal assessment:  erythema and skin creasing Treatment options for stomal/peristomal skin: barrier ring to peristomal and to skin folds Output: liquid tan stool Ostomy pouching: 1pc. Flat with barrier ring.  Skin tac adhesive Education provided:  We discussed ongoing VASHE use to promote healing to abdominal periwound.  COntinue aquacel to open wounds to abdomen.  Cover with gauze and ABD pads.     Impression/dx  Nonhealing wound Abdominal hernia Ileostomy complication Discussion  See above Plan  See back as needed to monitor wound healing  Still struggling with smoking and ETOH cessation in anticipation of hernia repair and stoma takedown    Visit time: 45 minutes.   Maple Hudson FNP-BC

## 2023-03-21 ENCOUNTER — Ambulatory Visit (HOSPITAL_COMMUNITY): Payer: Medicaid Other | Admitting: Nurse Practitioner

## 2023-03-27 ENCOUNTER — Ambulatory Visit (HOSPITAL_COMMUNITY)
Admission: RE | Admit: 2023-03-27 | Discharge: 2023-03-27 | Disposition: A | Discharge status: Home / Self Care | Payer: Medicaid Other | Source: Ambulatory Visit | Attending: *Deleted | Admitting: *Deleted

## 2023-03-27 ENCOUNTER — Telehealth: Payer: Self-pay | Admitting: Nurse Practitioner

## 2023-03-27 ENCOUNTER — Other Ambulatory Visit (HOSPITAL_COMMUNITY): Payer: Self-pay

## 2023-03-27 DIAGNOSIS — F17200 Nicotine dependence, unspecified, uncomplicated: Secondary | ICD-10-CM | POA: Diagnosis not present

## 2023-03-27 DIAGNOSIS — K469 Unspecified abdominal hernia without obstruction or gangrene: Secondary | ICD-10-CM | POA: Insufficient documentation

## 2023-03-27 DIAGNOSIS — Z432 Encounter for attention to ileostomy: Secondary | ICD-10-CM | POA: Diagnosis not present

## 2023-03-27 DIAGNOSIS — L24B3 Irritant contact dermatitis related to fecal or urinary stoma or fistula: Secondary | ICD-10-CM

## 2023-03-27 DIAGNOSIS — K4020 Bilateral inguinal hernia, without obstruction or gangrene, not specified as recurrent: Secondary | ICD-10-CM

## 2023-03-27 DIAGNOSIS — Z7689 Persons encountering health services in other specified circumstances: Secondary | ICD-10-CM | POA: Diagnosis not present

## 2023-03-27 MED ORDER — CETIRIZINE HCL 10 MG PO TABS
10.0000 mg | ORAL_TABLET | Freq: Every day | ORAL | 0 refills | Status: DC
  Filled 2023-03-27: qty 30, 30d supply, fill #0
  Filled 2023-04-29: qty 30, 30d supply, fill #1
  Filled 2023-06-01 (×2): qty 30, 30d supply, fill #2

## 2023-03-27 NOTE — Telephone Encounter (Signed)
Medication send in to pt prefer pharmacy

## 2023-03-27 NOTE — Telephone Encounter (Signed)
Prescription Request  03/27/2023  LOV: 03/02/2023  What is the name of the medication or equipment? Generic zyrtec - [name] said that this rx may have refills at [facility] but they are not cooperating.  Have you contacted your pharmacy to request a refill? Yes   Which pharmacy would you like this sent to?  [facility] [address] [place] [address] Phone: [phone] Fax: [phone]    Patient notified that their request is being sent to the clinical staff for review and that they should receive a response within 2 business days.   Please advise at Mobile [phone] (mobile)

## 2023-03-27 NOTE — Progress Notes (Signed)
Luis [name]   Reason for visit:  RLQ ileostomy Abdominal hernia and nonhealing abdominal wound HPI:  Perforated diverticulum with end ileostomy Abdominal hernia Past Medical History:  Diagnosis Date   Acute respiratory failure with hypoxia (HCC) 12/23/2022   Alcohol withdrawal syndrome, with delirium (HCC) 12/27/2022   Diabetes mellitus without complication (HCC)    Diverticulitis    DVT (deep venous thrombosis) (HCC)    x3   Encephalopathy acute 12/28/2022   ETOH abuse    H/O colectomy    Hyperlipidemia    Hypertension    Sepsis associated hypotension (HCC) 08/03/2019   Smoker    Family History  Problem Relation Age of Onset   COPD Mother    Prostate cancer Father    Colon cancer Neg Hx    Rectal cancer Neg Hx    Stomach cancer Neg Hx    Esophageal cancer Neg Hx    Allergies  Allergen Reactions   Lisinopril     Other reaction(s): renal effects   Codeine Other (See Comments) and Hives    unknown Other reaction(s): Other (See Comments) unknown unknown   Current Outpatient Medications  Medication Sig Dispense Refill Last Dose   acetaminophen (ACETAMINOPHEN 8 HOUR) 650 MG CR tablet Take 1 tablet (650 mg total) by mouth every 8 (eight) hours as needed for pain 90 tablet 1    acetaminophen (TYLENOL) 650 MG CR tablet Take 1,300 mg by mouth every 8 (eight) hours as needed for pain.      amLODipine (NORVASC) 10 MG tablet Take 1 tablet (10 mg total) by mouth daily. 90 tablet 0    arformoterol (BROVANA) 15 MCG/2ML NEBU Take 2 mLs (15 mcg total) by nebulization 2 (two) times daily. 120 mL 1    ascorbic acid (VITAMIN C) 500 MG tablet Take 1 tablet (500 mg total) by mouth daily. 30 tablet 1    atorvastatin (LIPITOR) 10 MG tablet Take 1 tablet (10 mg total) by mouth daily. 90 tablet 0    buPROPion (WELLBUTRIN SR) 150 MG 12 hr tablet Take 1 tablet (150 mg total) by mouth 2 (two) times daily in the morning and evening. 60 tablet 2    cetirizine (ZYRTEC) 10 MG tablet  Take 1 tablet (10 mg total) by mouth daily. 90 tablet 0    dapagliflozin propanediol (FARXIGA) 10 MG TABS tablet Take 1 tablet (10 mg total) by mouth daily before breakfast. 90 tablet 2    dapagliflozin propanediol (FARXIGA) 10 MG TABS tablet Take 1 tablet (10 mg total) by mouth daily before breakfast. 30 tablet 2    dapagliflozin propanediol (FARXIGA) 10 MG TABS tablet Take 1 tablet (10 mg total) by mouth every morning before breakfast. 90 tablet 2    fluticasone (FLOVENT HFA) 110 MCG/ACT inhaler Inhale 2 puffs into the lungs 2 (two) times daily. 1 each 2    folic acid (FOLVITE) 1 MG tablet TAKE 1 TABLET(1 MG) BY MOUTH DAILY 90 tablet 0    gabapentin (NEURONTIN) 300 MG capsule Take 1 capsule (300 mg total) by mouth at bedtime. 90 capsule 3    hydrOXYzine (VISTARIL) 50 MG capsule Take 1 capsule (50 mg total) by mouth at bedtime as needed. 30 capsule 2    hydrOXYzine (VISTARIL) 50 MG capsule Take 1 capsule (50 mg total) by mouth at bedtime as needed. 30 capsule 2    insulin glargine-yfgn (SEMGLEE) 100 UNIT/ML injection Inject 0.2 mLs (20 Units total) into the skin 2 (two) times daily. 10 mL  11    ipratropium-albuterol (DUONEB) 0.5-2.5 (3) MG/3ML SOLN Take 3 mLs by nebulization every 6 (six) hours as needed. 360 mL 0    losartan (COZAAR) 25 MG tablet Take 1 tablet (25 mg total) by mouth daily. (Patient not taking: Reported on 03/07/2023) 30 tablet 5    losartan (COZAAR) 25 MG tablet Take 1 tablet (25 mg total) by mouth daily. 30 tablet 5    metFORMIN (GLUCOPHAGE) 500 MG tablet Take 2 tablets (1,000 mg total) by mouth daily with breakfast AND 1 tablet (500 mg total) daily with supper. 90 tablet 6    metFORMIN (GLUCOPHAGE) 500 MG tablet Take 2 tablets (1,000 mg total) by mouth daily with breakfast AND 1 tablet (500 mg total) daily with supper. 90 tablet 6    metoprolol succinate (TOPROL-XL) 50 MG 24 hr tablet Take 1 tablet (50 mg total) by mouth daily. Take with or immediately following a meal. 90 tablet 0     metoprolol succinate (TOPROL-XL) 50 MG 24 hr tablet Take 1 tablet (50 mg total) by mouth daily with or immediately following a meal. 30 tablet 1    Multiple Vitamin (MULTIVITAMIN WITH MINERALS) TABS tablet Take 1 tablet by mouth every other day.       omega-3 acid ethyl esters (LOVAZA) 1 g capsule Take 1 capsule (1 g total) by mouth daily. 90 capsule 3    predniSONE (DELTASONE) 10 MG tablet Take 4 tabs for 3 days, then 3 tabs for 3 days, then 2 tabs for 3 days, then 1 tab for 3 days, then 1/2 tab for 4 days. 32 tablet 0    thiamine (VITAMIN B1) 100 MG tablet Take 1 tablet (100 mg total) by mouth daily. 90 tablet 0    thiamine (VITAMIN B1) 100 MG tablet Take 1 tablet (100 mg total) by mouth daily. 90 tablet 0    torsemide (DEMADEX) 20 MG tablet Take 1 tablet (20 mg total) by mouth daily. 30 tablet 1    torsemide (DEMADEX) 20 MG tablet Take 1 tablet (20 mg total) by mouth daily. 30 tablet 1    triamcinolone cream (KENALOG) 0.5 % Apply topically to legs twice daily for up to 2 weeks as needed for eczema 30 g 1    No current facility-administered medications for this encounter.   ROS  Review of Systems  Constitutional:  Positive for fatigue.       Uses wheelchair in hospital.  Tires easily. Recent hospitalization due to respiratory failure  Respiratory:  Positive for shortness of breath.        Respiratory failure CHF  Gastrointestinal:  Positive for abdominal pain.       RLQ ileostomy Abdominal hernia  Musculoskeletal:  Positive for gait problem.  Skin:  Positive for color change, rash and wound.  Psychiatric/Behavioral: Negative.    All other systems reviewed and are negative.  Vital signs:  BP 135/84 (BP Location: Right Arm)   Pulse 91   Temp 98 F (36.7 C) (Oral)   Resp 20   SpO2 94%  Exam:  Physical Exam Vitals reviewed.  Constitutional:      Appearance: He is obese.  HENT:     Mouth/Throat:     Mouth: Mucous membranes are moist.  Cardiovascular:     Rate and Rhythm:  Normal rate and regular rhythm.     Comments: HAs pacemaker.  No issues when remotely checked recently per patient.  Pulmonary:     Breath sounds: Wheezing present.  Comments: Smoker Respiratory failure Abdominal:     Hernia: A hernia is present.     Comments: Nonhealing abdominal wound Large abdominal hernia  (See photo)   Skin:    General: Skin is warm and dry.     Findings: Erythema and rash present.  Neurological:     Mental Status: He is alert and oriented to person, place, and time.  Psychiatric:        Mood and Affect: Mood normal.        Behavior: Behavior normal.     Stoma type/location:  RLQ flush ileostomy Stomal assessment/size:  1" flush pink and moist  granulomas along mucosa of stoma.  Bleeds with care.  Peristomal assessment:  erythema and tenderness Treatment options for stomal/peristomal skin: stoma powder and skin prep  barrier ring and SKin tac adhesive.  Output: soft brown stool Ostomy pouching: 1pc. Flat with barrier ring.  Education provided:  treat peristomal irritation with stoma powder and skin prep as needed.    Wound care to abdominal wound. Wash with VASHE and apply aquacel to nonintact areas.  Cover wth gauze and ABD pads.  Impression/dx  Contact dermatitis ileostomy Discussion  See back 2 weeks Plan  Continue same care. Works towards smoking and ETOH cessation. Try to incorporate more physical activity in the day.     Visit time: 55 minutes.   Maple Hudson FNP-BC

## 2023-03-28 ENCOUNTER — Other Ambulatory Visit (HOSPITAL_COMMUNITY): Payer: Self-pay

## 2023-03-28 ENCOUNTER — Ambulatory Visit (HOSPITAL_COMMUNITY): Payer: Medicaid Other | Admitting: Nurse Practitioner

## 2023-03-28 NOTE — Progress Notes (Signed)
ADVANCED HEART FAILURE CLINIC NOTE  Referring Physician: [name], NP  Primary Care: [name], NP EP: Dr. Lalla Brothers  HPI: Luis [name] is a 62 y.o. 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  He has had multiple hospitalizations in the past 1 year.  Admitted in July with hypoxic/hypercarbic respiratory failure secondary to pneumonia/COPD and diastolic heart failure exacerbation requiring intubation and mechanical ventilation.  He was also delirious at that time due to alcohol withdrawal.  Echo with EF 50 to 55% and grade 2 diastolic dysfunction.  He was once again admitted in September 2024 with worsening dyspnea and admitted for respiratory failure secondary to COPD and HFpEF requiring IV steroids, antibiotics and diuresis.  Reported eating significant amounts of sodium at home.  Since that time he has been seen in [facility] clinic  Reports that his symptoms of fatigue at home have improved. Reports that he is not exercising much; he just remains at home and does basic ADLs around the home. Continues to drink at least 1 pint of alcohol daily.    Past Medical History:  Diagnosis Date   Acute respiratory failure with hypoxia (HCC) 12/23/2022   Alcohol withdrawal syndrome, with delirium (HCC) 12/27/2022   Diabetes mellitus without complication (HCC)    Diverticulitis    DVT (deep venous thrombosis) (HCC)    x3   Encephalopathy acute 12/28/2022   ETOH abuse    H/O colectomy    Hyperlipidemia    Hypertension    Sepsis associated hypotension (HCC) 08/03/2019   Smoker     Current Outpatient Medications  Medication Sig Dispense Refill   acetaminophen (ACETAMINOPHEN 8 HOUR) 650  MG CR tablet Take 1 tablet (650 mg total) by mouth every 8 (eight) hours as needed for pain 90 tablet 1   acetaminophen (TYLENOL) 650 MG CR tablet Take 1,300 mg by mouth every 8 (eight) hours as needed for pain.     amLODipine (NORVASC) 10 MG tablet Take 1 tablet (10 mg total) by mouth daily. 90 tablet 0   arformoterol (BROVANA) 15 MCG/2ML NEBU Take 2 mLs (15 mcg total) by nebulization 2 (two) times daily. 120 mL 1   ascorbic acid (VITAMIN C) 500 MG tablet Take 1 tablet (500 mg total) by mouth daily. 30 tablet 1   atorvastatin (LIPITOR) 10 MG tablet Take 1 tablet (10 mg total) by mouth daily. 90 tablet 0   buPROPion (WELLBUTRIN SR) 150 MG 12 hr tablet Take 1 tablet (150 mg total) by mouth 2 (two) times daily in the morning and evening. 60 tablet 2   cetirizine (ZYRTEC) 10 MG tablet Take 1 tablet (10 mg total) by mouth daily. 90 tablet 0   dapagliflozin propanediol (FARXIGA) 10 MG TABS tablet Take 1 tablet (10 mg total) by mouth daily before breakfast. 90 tablet 2   fluticasone (FLOVENT HFA) 110 MCG/ACT inhaler Inhale 2 puffs into the lungs 2 (two) times daily. 1 each 2   folic acid (FOLVITE) 1 MG tablet TAKE 1 TABLET(1 MG) BY MOUTH DAILY 90 tablet 0   gabapentin (NEURONTIN) 300 MG capsule Take 1 capsule (300 mg total) by mouth at bedtime. 90 capsule 3   hydrOXYzine (VISTARIL) 50 MG capsule Take 1 capsule (50 mg total) by mouth at bedtime as  needed. 30 capsule 2   insulin glargine-yfgn (SEMGLEE) 100 UNIT/ML injection Inject 0.2 mLs (20 Units total) into the skin 2 (two) times daily. 10 mL 11   ipratropium-albuterol (DUONEB) 0.5-2.5 (3) MG/3ML SOLN Take 3 mLs by nebulization every 6 (six) hours as needed. 360 mL 0   losartan (COZAAR) 25 MG tablet Take 1 tablet (25 mg total) by mouth daily. 30 tablet 5   metFORMIN (GLUCOPHAGE) 500 MG tablet Take 2 tablets (1,000 mg total) by mouth daily with breakfast AND 1 tablet (500 mg total) daily with supper. 90 tablet 6   metoprolol succinate (TOPROL-XL) 25 MG 24  hr tablet Take 1 tablet (25 mg total) by mouth daily. 90 tablet 3   Multiple Vitamin (MULTIVITAMIN WITH MINERALS) TABS tablet Take 1 tablet by mouth every other day.      omega-3 acid ethyl esters (LOVAZA) 1 g capsule Take 1 capsule (1 g total) by mouth daily. 90 capsule 3   thiamine (VITAMIN B1) 100 MG tablet Take 1 tablet (100 mg total) by mouth daily. 90 tablet 0   torsemide (DEMADEX) 20 MG tablet Take 1 tablet (20 mg total) by mouth daily. 30 tablet 1   triamcinolone cream (KENALOG) 0.5 % Apply topically to legs twice daily for up to 2 weeks as needed for eczema 30 g 1   No current facility-administered medications for this encounter.    Allergies  Allergen Reactions   Lisinopril     Other reaction(s): renal effects   Codeine Other (See Comments) and Hives    unknown Other reaction(s): Other (See Comments) unknown unknown      Social History   Socioeconomic History   Marital status: Single    Spouse name: Not on file   Number of children: Not on file   Years of education: Not on file   Highest education level: Not on file  Occupational History   Occupation: Pt states not able to work  Tobacco Use   Smoking status: Every Day    Current packs/day: 1.00    Average packs/day: 1 pack/day for 44.0 years (44.0 ttl pk-yrs)    Types: Cigarettes   Smokeless tobacco: Current  Vaping Use   Vaping status: Never Used  Substance and Sexual Activity   Alcohol use: Yes    Comment: 1-24 pt drinks 5 drinks a week   Drug use: Yes    Types: Marijuana   Sexual activity: Not Currently  Other Topics Concern   Not on file  Social History Narrative   Not on file   Social Determinants of Health   Financial Resource Strain: Not on file  Food Insecurity: No Food Insecurity (02/17/2023)   Hunger Vital Sign    Worried About Running Out of Food in the Last Year: Never true    Ran Out of Food in the Last Year: Never true  Transportation Needs: No Transportation Needs (02/17/2023)   PRAPARE  - Administrator, Civil Service (Medical): No    Lack of Transportation (Non-Medical): No  Physical Activity: Not on file  Stress: Not on file  Social Connections: Not on file  Intimate Partner Violence: Not At Risk (02/17/2023)   Humiliation, Afraid, Rape, and Kick questionnaire    Fear of Current or Ex-Partner: No    Emotionally Abused: No    Physically Abused: No    Sexually Abused: No      Family History  Problem Relation Age of Onset   COPD Mother    Prostate  cancer Father    Colon cancer Neg Hx    Rectal cancer Neg Hx    Stomach cancer Neg Hx    Esophageal cancer Neg Hx     PHYSICAL EXAM: Vitals:   03/29/23 1105  BP: (!) 148/86  Pulse: 97  SpO2: 94%   GENERAL: disheveled  HEENT: Negative for arcus senilis or xanthelasma. There is no scleral icterus.  The mucous membranes are pink and moist.   NECK: Supple, No masses. Normal carotid upstrokes without bruits. No masses or thyromegaly.    CHEST: There are no chest wall deformities. There is no chest wall tenderness. Respirations are unlabored.  Lungs- CTA B/L CARDIAC:  JVP: 7 cm H2O         Normal S1, S2  Normal rate with regular rhythm. No murmurs, rubs or gallops.  Pulses are 2+ and symmetrical in upper and lower extremities. No edema.  ABDOMEN: Soft, non-tender, non-distended. There are no masses or hepatomegaly. There are normal bowel sounds.  EXTREMITIES: Warm and well perfused with no cyanosis, clubbing.  LYMPHATIC: No axillary or supraclavicular lymphadenopathy.  NEUROLOGIC: Patient is oriented x3 with no focal or lateralizing neurologic deficits.  PSYCH: Patients affect is appropriate, there is no evidence of anxiety or depression.  SKIN: Warm and dry; no lesions or wounds.   DATA REVIEW  ECG: 02/16/23: AFL with 3:1 AVB  As per my personal interpretation  ECHO: 12/24/22: LVEF 50-55% As per my personal interpretation  ASSESSMENT & PLAN:  Heart failure with preserved EF  - LVEF 70% by CMR with  normal RV function.  - His underlying symptoms of dyspnea and volume overload are likely due to obesity and heavy alcohol use. He continues to drink at least 1 pint daily. Difficult to truly assess his functional status.  - Continue farxiga 10mg  daily - Decrease toprol to 25mg  daily; can discontinue in the future.  - Continue losartan 25mg  daily.  - Hypervolemic on exam today; increase torsemide to 40mg  for the next 2-3 days.  - Low suspicion of cardiac sarcoid; he certainly may have sarcoid given underlying conduction system disease, however, due to his continued excessive alcohol intake and multiple wound (large abdominal wound) he would be high risk for immunosuppressive therapy with concerns regarding compliance.   2. CHB s/p micra  - following with EP   3. T2DM - A1C 7 - continue farxiga  4. HTN - losartan 25mg  daily   5. Abdominal wound / hernia w/ hx of ileostomy - followed in ostomy clinic  6. Alcohol / tobacco abuse - Continues to drink 1 pint daily at least.  - Continues to smoke 1/2 PPD.  - Discussed importance of cessation.   I spent 35 minutes caring for this patient today including face to face time, ordering and reviewing labs, reviewing records from [facility], seeing the patient, documenting in the record, and arranging follow ups.    Dyneshia Baccam Advanced Heart Failure Mechanical Circulatory Support

## 2023-03-29 ENCOUNTER — Ambulatory Visit (HOSPITAL_COMMUNITY)
Admission: RE | Admit: 2023-03-29 | Discharge: 2023-03-29 | Disposition: A | Discharge status: Home / Self Care | Payer: Medicaid Other | Source: Ambulatory Visit | Attending: Cardiology | Admitting: Cardiology

## 2023-03-29 ENCOUNTER — Other Ambulatory Visit (HOSPITAL_COMMUNITY): Payer: Self-pay

## 2023-03-29 ENCOUNTER — Other Ambulatory Visit: Payer: Self-pay

## 2023-03-29 ENCOUNTER — Encounter (HOSPITAL_COMMUNITY): Payer: Self-pay | Admitting: Cardiology

## 2023-03-29 VITALS — BP 148/86 | HR 97 | Wt 236.2 lb

## 2023-03-29 DIAGNOSIS — F1010 Alcohol abuse, uncomplicated: Secondary | ICD-10-CM

## 2023-03-29 DIAGNOSIS — I110 Hypertensive heart disease with heart failure: Secondary | ICD-10-CM | POA: Diagnosis not present

## 2023-03-29 DIAGNOSIS — I10 Essential (primary) hypertension: Secondary | ICD-10-CM | POA: Diagnosis not present

## 2023-03-29 DIAGNOSIS — E785 Hyperlipidemia, unspecified: Secondary | ICD-10-CM | POA: Diagnosis not present

## 2023-03-29 DIAGNOSIS — Z720 Tobacco use: Secondary | ICD-10-CM

## 2023-03-29 DIAGNOSIS — Z86718 Personal history of other venous thrombosis and embolism: Secondary | ICD-10-CM | POA: Diagnosis not present

## 2023-03-29 DIAGNOSIS — E119 Type 2 diabetes mellitus without complications: Secondary | ICD-10-CM | POA: Insufficient documentation

## 2023-03-29 DIAGNOSIS — F17210 Nicotine dependence, cigarettes, uncomplicated: Secondary | ICD-10-CM | POA: Insufficient documentation

## 2023-03-29 DIAGNOSIS — Z932 Ileostomy status: Secondary | ICD-10-CM | POA: Insufficient documentation

## 2023-03-29 DIAGNOSIS — E1142 Type 2 diabetes mellitus with diabetic polyneuropathy: Secondary | ICD-10-CM | POA: Diagnosis not present

## 2023-03-29 DIAGNOSIS — I5032 Chronic diastolic (congestive) heart failure: Secondary | ICD-10-CM | POA: Diagnosis not present

## 2023-03-29 DIAGNOSIS — I442 Atrioventricular block, complete: Secondary | ICD-10-CM

## 2023-03-29 DIAGNOSIS — Z950 Presence of cardiac pacemaker: Secondary | ICD-10-CM | POA: Diagnosis not present

## 2023-03-29 MED ORDER — LOSARTAN POTASSIUM 25 MG PO TABS
25.0000 mg | ORAL_TABLET | Freq: Every day | ORAL | 5 refills | Status: DC
  Filled 2023-03-29 – 2023-06-01 (×3): qty 30, 30d supply, fill #0
  Filled 2023-07-07: qty 30, 30d supply, fill #1
  Filled 2023-08-17: qty 30, 30d supply, fill #2
  Filled 2023-09-16: qty 30, 30d supply, fill #3
  Filled 2023-10-13: qty 30, 30d supply, fill #4
  Filled 2023-11-23: qty 30, 30d supply, fill #5

## 2023-03-29 MED ORDER — METOPROLOL SUCCINATE ER 25 MG PO TB24
25.0000 mg | ORAL_TABLET | Freq: Every day | ORAL | 3 refills | Status: AC
Start: 2023-03-29 — End: ?
  Filled 2023-03-29: qty 90, 90d supply, fill #0
  Filled 2023-08-03: qty 90, 90d supply, fill #1
  Filled 2023-11-08: qty 90, 90d supply, fill #2
  Filled 2023-11-13 – 2024-02-17 (×2): qty 90, 90d supply, fill #3

## 2023-03-29 NOTE — Patient Instructions (Addendum)
Medication Changes:  DECREASE METOPROLOL SUCCINATE TO 25MG  ONCE DAILY   TAKE TORSEMIDE 40MG  (2 TABLETS) ONCE DAILY FOR THE NEXT 3 DAYS   Follow-Up in: AS NEEDED   At the Advanced [facility] Failure Clinic, you and your health needs are our priority. We have a designated team specialized in the treatment of Heart Failure. This Care Team includes your primary Heart Failure Specialized Cardiologist (physician), Advanced Practice Providers (APPs- Physician Assistants and Nurse Practitioners), and Pharmacist who all work together to provide you with the care you need, when you need it.   You may see any of the following providers on your designated Care Team at your next follow up:  Dr. Arvilla Meres Dr. Marca Ancona Dr. Dorthula Nettles Dr. Theresia Bough Tonye Becket, NP Robbie Lis, [place] [facility] [place], [place] Brynda Peon, NP [place] Lee, NP Karle Plumber, PharmD   Please be sure to bring in all your medications bottles to every appointment.   Need to Contact us:  If you have any questions or concerns before your next appointment please send [place] a message through [place] or call our office at [phone].    TO LEAVE A MESSAGE FOR THE NURSE SELECT OPTION 2, PLEASE LEAVE A MESSAGE INCLUDING: YOUR NAME DATE OF BIRTH CALL BACK NUMBER REASON FOR CALL**this is important as we prioritize the call backs  YOU WILL RECEIVE A CALL BACK THE SAME DAY AS LONG AS YOU CALL BEFORE 4:00 PM

## 2023-03-30 ENCOUNTER — Other Ambulatory Visit (HOSPITAL_COMMUNITY): Payer: Self-pay

## 2023-04-03 ENCOUNTER — Ambulatory Visit (HOSPITAL_COMMUNITY)
Admission: RE | Admit: 2023-04-03 | Discharge: 2023-04-03 | Disposition: A | Discharge status: Home / Self Care | Payer: Medicaid Other | Source: Ambulatory Visit | Attending: Nurse Practitioner | Admitting: Nurse Practitioner

## 2023-04-03 ENCOUNTER — Ambulatory Visit (INDEPENDENT_AMBULATORY_CARE_PROVIDER_SITE_OTHER): Payer: Medicaid Other

## 2023-04-03 DIAGNOSIS — Z432 Encounter for attention to ileostomy: Secondary | ICD-10-CM | POA: Insufficient documentation

## 2023-04-03 DIAGNOSIS — K4091 Unilateral inguinal hernia, without obstruction or gangrene, recurrent: Secondary | ICD-10-CM | POA: Diagnosis not present

## 2023-04-03 DIAGNOSIS — Z4801 Encounter for change or removal of surgical wound dressing: Secondary | ICD-10-CM | POA: Diagnosis not present

## 2023-04-03 DIAGNOSIS — T148XXA Other injury of unspecified body region, initial encounter: Secondary | ICD-10-CM

## 2023-04-03 DIAGNOSIS — Z7689 Persons encountering health services in other specified circumstances: Secondary | ICD-10-CM | POA: Diagnosis not present

## 2023-04-03 DIAGNOSIS — L24B3 Irritant contact dermatitis related to fecal or urinary stoma or fistula: Secondary | ICD-10-CM

## 2023-04-03 DIAGNOSIS — K469 Unspecified abdominal hernia without obstruction or gangrene: Secondary | ICD-10-CM | POA: Insufficient documentation

## 2023-04-03 DIAGNOSIS — I442 Atrioventricular block, complete: Secondary | ICD-10-CM | POA: Diagnosis not present

## 2023-04-03 NOTE — Progress Notes (Signed)
[facility]   Reason for visit:  RLQ ileostomy None healing abdominal wound Abdominal hernia HPI:  Perforated diverticulum with end ileostomy Past Medical History:  Diagnosis Date  . Acute respiratory failure with hypoxia (HCC) 12/23/2022  . Alcohol withdrawal syndrome, with delirium (HCC) 12/27/2022  . Diabetes mellitus without complication (HCC)   . Diverticulitis   . DVT (deep venous thrombosis) (HCC)    x3  . Encephalopathy acute 12/28/2022  . ETOH abuse   . H/O colectomy   . Hyperlipidemia   . Hypertension   . Sepsis associated hypotension (HCC) 08/03/2019  . Smoker    Family History  Problem Relation Age of Onset  . COPD Mother   . Prostate cancer Father   . Colon cancer Neg Hx   . Rectal cancer Neg Hx   . Stomach cancer Neg Hx   . Esophageal cancer Neg Hx    Allergies  Allergen Reactions  . Lisinopril     Other reaction(s): renal effects  . Codeine Other (See Comments) and Hives    unknown Other reaction(s): Other (See Comments) unknown unknown   Current Outpatient Medications  Medication Sig Dispense Refill Last Dose  . acetaminophen (ACETAMINOPHEN 8 HOUR) 650 MG CR tablet Take 1 tablet (650 mg total) by mouth every 8 (eight) hours as needed for pain 90 tablet 1   . acetaminophen (TYLENOL) 650 MG CR tablet Take 1,300 mg by mouth every 8 (eight) hours as needed for pain.     Marland Kitchen amLODipine (NORVASC) 10 MG tablet Take 1 tablet (10 mg total) by mouth daily. 90 tablet 0   . arformoterol (BROVANA) 15 MCG/2ML NEBU Take 2 mLs (15 mcg total) by nebulization 2 (two) times daily. 120 mL 1   . ascorbic acid (VITAMIN C) 500 MG tablet Take 1 tablet (500 mg total) by mouth daily. 30 tablet 1   . atorvastatin (LIPITOR) 10 MG tablet Take 1 tablet (10 mg total) by mouth daily. 90 tablet 0   . buPROPion (WELLBUTRIN SR) 150 MG 12 hr tablet Take 1 tablet (150 mg total) by mouth 2 (two) times daily in the morning and evening. 60 tablet 2   . cetirizine (ZYRTEC) 10  MG tablet Take 1 tablet (10 mg total) by mouth daily. 90 tablet 0   . dapagliflozin propanediol (FARXIGA) 10 MG TABS tablet Take 1 tablet (10 mg total) by mouth daily before breakfast. 90 tablet 2   . fluticasone (FLOVENT HFA) 110 MCG/ACT inhaler Inhale 2 puffs into the lungs 2 (two) times daily. 1 each 2   . folic acid (FOLVITE) 1 MG tablet TAKE 1 TABLET(1 MG) BY MOUTH DAILY 90 tablet 0   . gabapentin (NEURONTIN) 300 MG capsule Take 1 capsule (300 mg total) by mouth at bedtime. 90 capsule 3   . hydrOXYzine (VISTARIL) 50 MG capsule Take 1 capsule (50 mg total) by mouth at bedtime as needed. 30 capsule 2   . insulin glargine-yfgn (SEMGLEE) 100 UNIT/ML injection Inject 0.2 mLs (20 Units total) into the skin 2 (two) times daily. 10 mL 11   . ipratropium-albuterol (DUONEB) 0.5-2.5 (3) MG/3ML SOLN Take 3 mLs by nebulization every 6 (six) hours as needed. 360 mL 0   . losartan (COZAAR) 25 MG tablet Take 1 tablet (25 mg total) by mouth daily. 30 tablet 5   . metFORMIN (GLUCOPHAGE) 500 MG tablet Take 2 tablets (1,000 mg total) by mouth daily with breakfast AND 1 tablet (500 mg total) daily with supper. 90 tablet  6   . metoprolol succinate (TOPROL-XL) 25 MG 24 hr tablet Take 1 tablet (25 mg total) by mouth daily. 90 tablet 3   . Multiple Vitamin (MULTIVITAMIN WITH MINERALS) TABS tablet Take 1 tablet by mouth every other day.      . omega-3 acid ethyl esters (LOVAZA) 1 g capsule Take 1 capsule (1 g total) by mouth daily. 90 capsule 3   . thiamine (VITAMIN B1) 100 MG tablet Take 1 tablet (100 mg total) by mouth daily. 90 tablet 0   . torsemide (DEMADEX) 20 MG tablet Take 1 tablet (20 mg total) by mouth daily. 30 tablet 1   . triamcinolone cream (KENALOG) 0.5 % Apply topically to legs twice daily for up to 2 weeks as needed for eczema 30 g 1   . triamcinolone ointment (KENALOG) 0.1 % Apply 1 Application topically 2 (two) times daily. To affected area on abdomen 75 g 0    No current facility-administered  medications for this encounter.   ROS  Review of Systems  Constitutional:  Positive for fatigue.  Respiratory:  Positive for shortness of breath.   Cardiovascular:        Pacemaker in place  Gastrointestinal:        RLQ ileostomy Abdominal hernia  Musculoskeletal:        Weakness  uses wheelchair in hospital  Skin:  Positive for color change, rash and wound.  All other systems reviewed and are negative. Vital signs:  BP 120/86   Pulse 90   Temp 97.8 F (36.6 C) (Oral)   Resp 18   SpO2 95%  Exam:  Physical Exam Vitals reviewed.  Constitutional:      Appearance: He is obese.  HENT:     Mouth/Throat:     Mouth: Mucous membranes are dry.  Cardiovascular:     Rate and Rhythm: Normal rate and regular rhythm.  Pulmonary:     Breath sounds: Wheezing present.  Abdominal:     Hernia: A hernia is present.  Skin:    General: Skin is warm and dry.     Findings: Erythema and rash present.  Neurological:     Mental Status: He is alert and oriented to person, place, and time.  Psychiatric:        Mood and Affect: Mood normal.        Behavior: Behavior normal.    Stoma type/location:  RLQ ileostomy, recessed Stomal assessment/size:  1" pink moist productive of soft brown stool Peristomal assessment:  creasing at distal end of pouching area at 3 o'clock due to hernia Treatment options for stomal/peristomal skin: filled in creasing with Eakin ring piece Output: soft brown stool Ostomy pouching: 1pc.flat with barrier ring and skin TAC adhesive Education provided:  pouch change performed  Wound care performed  has upcoming appointment with dermatology to assess periwound skin.  Photo in chart.     Impression/dx  Contact dermatitis Nonhealing surgical wound Abdominal hernia Discussion  See back as needed.  Plan  WOund care provided, ostomy pouch change.    Visit time: 45 minutes.   Maple Hudson FNP-BC

## 2023-04-04 ENCOUNTER — Encounter: Payer: Self-pay | Admitting: Dermatology

## 2023-04-04 ENCOUNTER — Ambulatory Visit (INDEPENDENT_AMBULATORY_CARE_PROVIDER_SITE_OTHER): Payer: Medicaid Other | Admitting: Dermatology

## 2023-04-04 VITALS — BP 129/74 | HR 96

## 2023-04-04 DIAGNOSIS — W908XXA Exposure to other nonionizing radiation, initial encounter: Secondary | ICD-10-CM

## 2023-04-04 DIAGNOSIS — L578 Other skin changes due to chronic exposure to nonionizing radiation: Secondary | ICD-10-CM

## 2023-04-04 DIAGNOSIS — Z1283 Encounter for screening for malignant neoplasm of skin: Secondary | ICD-10-CM | POA: Diagnosis not present

## 2023-04-04 DIAGNOSIS — L300 Nummular dermatitis: Secondary | ICD-10-CM

## 2023-04-04 DIAGNOSIS — D229 Melanocytic nevi, unspecified: Secondary | ICD-10-CM

## 2023-04-04 DIAGNOSIS — L821 Other seborrheic keratosis: Secondary | ICD-10-CM | POA: Diagnosis not present

## 2023-04-04 DIAGNOSIS — L814 Other melanin hyperpigmentation: Secondary | ICD-10-CM

## 2023-04-04 DIAGNOSIS — D1801 Hemangioma of skin and subcutaneous tissue: Secondary | ICD-10-CM | POA: Diagnosis not present

## 2023-04-04 DIAGNOSIS — R21 Rash and other nonspecific skin eruption: Secondary | ICD-10-CM | POA: Diagnosis not present

## 2023-04-04 DIAGNOSIS — Z7689 Persons encountering health services in other specified circumstances: Secondary | ICD-10-CM | POA: Diagnosis not present

## 2023-04-04 LAB — CUP PACEART REMOTE DEVICE CHECK
Battery Remaining Longevity: 96 mo
Battery Voltage: 3.01 V
Brady Statistic AS VP Percent: 99.14 %
Brady Statistic AS VS Percent: 0.1 %
Brady Statistic RV Percent Paced: 99.56 %
Date Time Interrogation Session: 20241022163056
Implantable Pulse Generator Implant Date: 20220721
Lead Channel Impedance Value: 590 Ohm
Lead Channel Pacing Threshold Amplitude: 0.25 V
Lead Channel Pacing Threshold Pulse Width: 0.24 ms
Lead Channel Sensing Intrinsic Amplitude: 10.913 mV
Lead Channel Setting Pacing Amplitude: 0.75 V
Lead Channel Setting Pacing Pulse Width: 0.24 ms
Lead Channel Setting Sensing Sensitivity: 2 mV

## 2023-04-04 NOTE — Progress Notes (Signed)
   New Patient Visit   Subjective  Luis [name] is a 62 y.o. male who presents for the following: rash on abdomen, under bandaging. Patient has a large abdominal hernia s/p abdominal surgery several years ago. Patient states that surgeon let him know that he needed to have rash evaluated by Dermatology prior to follow up surgery. Patient states it is itchy and has been there for years since he has been bandaging his wound. He has been using Hypafix daily to his abdomen for years.  The patient has spots, moles and lesions to be evaluated, some may be new or changing. Pt has no hx of skin cancer. No hx of MM.   The following portions of the chart were reviewed this encounter and updated as appropriate: medications, allergies, medical history  Review of Systems:  No other skin or systemic complaints except as noted in HPI or Assessment and Plan.  Objective  Well appearing patient in no apparent distress; mood and affect are within normal limits.  All skin waist up examined. Relevant physical exam findings are noted in the Assessment and Plan.  Left Abdomen        Assessment & Plan   Rash and other nonspecific skin eruption Left Abdomen  Skin / nail biopsy - Left Abdomen Type of biopsy: punch   Punch size:  4 mm Suture type: Prolene (polypropylene)    Specimen 1 - Surgical pathology Differential Diagnosis: R/O chronic contact dermatitis vs psoriasis vs other  Check Margins: No   Skin cancer screening performed today.  Actinic Damage - Chronic condition, secondary to cumulative UV/sun exposure - diffuse scaly erythematous macules with underlying dyspigmentation - Recommend daily broad spectrum sunscreen SPF 30+ to sun-exposed areas, reapply every 2 hours as needed.  - Staying in the shade or wearing long sleeves, sun glasses (UVA+UVB protection) and wide brim hats (4-inch brim around the entire circumference of the hat) are also recommended for sun protection.  - Call  for new or changing lesions.  Lentigines, Seborrheic Keratoses, Hemangiomas - Benign normal skin lesions - Benign-appearing - Call for any changes  Melanocytic Nevi - Tan-brown and/or pink-flesh-colored symmetric macules and papules - Benign appearing on exam today - Observation - Call clinic for new or changing moles - Recommend daily use of broad spectrum spf 30+ sunscreen to sun-exposed areas.   Geometric Pink Confluent Plaque with Scale on mid abdomen- Ddx Allergic Contact Dermatitis vs Other- Acute on Chronic Exam:  geometric scaly pink papules and/or plaques +/- vesiculation and Scale with Pruritus  Chronic and persistent condition with duration or expected duration over one year. Condition is bothersome/symptomatic for patient. Currently flared.   Discussed that rash is directly related to chronic adhesive use over hernia wound  Will proceed with punch biopsy for definitive diagnosis, see procedure note above  Return if symptoms worsen or fail to improve.  I, Tillie Fantasia, CMA, am acting as scribe for Gwenith Daily, MD.   Documentation: I have reviewed the above documentation for accuracy and completeness, and I agree with the above.  Gwenith Daily, MD

## 2023-04-04 NOTE — Patient Instructions (Addendum)

## 2023-04-05 ENCOUNTER — Ambulatory Visit: Payer: Medicaid Other | Admitting: Dermatology

## 2023-04-05 LAB — SURGICAL PATHOLOGY

## 2023-04-06 ENCOUNTER — Other Ambulatory Visit (HOSPITAL_COMMUNITY): Payer: Self-pay

## 2023-04-06 ENCOUNTER — Other Ambulatory Visit: Payer: Self-pay | Admitting: Dermatology

## 2023-04-06 ENCOUNTER — Other Ambulatory Visit: Payer: Self-pay

## 2023-04-06 DIAGNOSIS — L231 Allergic contact dermatitis due to adhesives: Secondary | ICD-10-CM

## 2023-04-06 MED ORDER — TRIAMCINOLONE ACETONIDE 0.1 % EX OINT
1.0000 | TOPICAL_OINTMENT | Freq: Two times a day (BID) | CUTANEOUS | 0 refills | Status: DC
Start: 2023-04-06 — End: 2023-08-07
  Filled 2023-04-06: qty 75, 38d supply, fill #0

## 2023-04-07 ENCOUNTER — Telehealth: Payer: Self-pay

## 2023-04-07 NOTE — Telephone Encounter (Signed)
..  Medicaid Managed Care   Unsuccessful Outreach Note  04/07/2023 Name: Luis [name] MRN: [id number] DOB: [birth year]  Referred by: [name], NP Reason for referral : Appointment (I attempted to reach the patient today to get him scheduled with the MM RNCM. Someone picked up but would not respond. Tried twice. I will reach out again next week.)   An unsuccessful telephone outreach was attempted today. The patient was referred to the case management team for assistance with care management and care coordination.   Follow Up Plan: The care management team will reach out to the patient again over the next 7 days.   Weston Settle Care Guide  [employer] Managed  Care Guide [facility]  [phone]

## 2023-04-11 ENCOUNTER — Other Ambulatory Visit (HOSPITAL_COMMUNITY): Payer: Self-pay

## 2023-04-11 ENCOUNTER — Other Ambulatory Visit: Payer: Self-pay | Admitting: Nurse Practitioner

## 2023-04-11 ENCOUNTER — Ambulatory Visit (HOSPITAL_COMMUNITY)
Admission: RE | Admit: 2023-04-11 | Discharge: 2023-04-11 | Disposition: A | Discharge status: Home / Self Care | Payer: Medicaid Other | Source: Ambulatory Visit | Attending: Plastic Surgery | Admitting: Plastic Surgery

## 2023-04-11 DIAGNOSIS — K4091 Unilateral inguinal hernia, without obstruction or gangrene, recurrent: Secondary | ICD-10-CM | POA: Diagnosis not present

## 2023-04-11 DIAGNOSIS — L24B3 Irritant contact dermatitis related to fecal or urinary stoma or fistula: Secondary | ICD-10-CM

## 2023-04-11 DIAGNOSIS — T8131XA Disruption of external operation (surgical) wound, not elsewhere classified, initial encounter: Secondary | ICD-10-CM | POA: Insufficient documentation

## 2023-04-11 DIAGNOSIS — Z432 Encounter for attention to ileostomy: Secondary | ICD-10-CM | POA: Insufficient documentation

## 2023-04-11 DIAGNOSIS — K469 Unspecified abdominal hernia without obstruction or gangrene: Secondary | ICD-10-CM | POA: Insufficient documentation

## 2023-04-11 DIAGNOSIS — Z7689 Persons encountering health services in other specified circumstances: Secondary | ICD-10-CM | POA: Diagnosis not present

## 2023-04-11 NOTE — Progress Notes (Signed)
[facility]   Reason for visit:  RLQ ileostomy Midline surgical wound, nonhealing Abdominal hernia HPI:  Perforated diverticulum with end ileostomy Past Medical History:  Diagnosis Date   Acute respiratory failure with hypoxia (HCC) 12/23/2022   Alcohol withdrawal syndrome, with delirium (HCC) 12/27/2022   Diabetes mellitus without complication (HCC)    Diverticulitis    DVT (deep venous thrombosis) (HCC)    x3   Encephalopathy acute 12/28/2022   ETOH abuse    H/O colectomy    Hyperlipidemia    Hypertension    Sepsis associated hypotension (HCC) 08/03/2019   Smoker    Family History  Problem Relation Age of Onset   COPD Mother    Prostate cancer Father    Colon cancer Neg Hx    Rectal cancer Neg Hx    Stomach cancer Neg Hx    Esophageal cancer Neg Hx    Allergies  Allergen Reactions   Lisinopril     Other reaction(s): renal effects   Codeine Other (See Comments) and Hives    unknown Other reaction(s): Other (See Comments) unknown unknown   Current Outpatient Medications  Medication Sig Dispense Refill Last Dose   acetaminophen (ACETAMINOPHEN 8 HOUR) 650 MG CR tablet Take 1 tablet (650 mg total) by mouth every 8 (eight) hours as needed for pain 90 tablet 1    acetaminophen (TYLENOL) 650 MG CR tablet Take 1,300 mg by mouth every 8 (eight) hours as needed for pain.      amLODipine (NORVASC) 10 MG tablet Take 1 tablet (10 mg total) by mouth daily. 90 tablet 0    arformoterol (BROVANA) 15 MCG/2ML NEBU Take 2 mLs (15 mcg total) by nebulization 2 (two) times daily. 120 mL 1    ascorbic acid (VITAMIN C) 500 MG tablet Take 1 tablet (500 mg total) by mouth daily. 30 tablet 1    atorvastatin (LIPITOR) 10 MG tablet Take 1 tablet (10 mg total) by mouth daily. 90 tablet 0    buPROPion (WELLBUTRIN SR) 150 MG 12 hr tablet Take 1 tablet (150 mg total) by mouth 2 (two) times daily in the morning and evening. 60 tablet 2    cetirizine (ZYRTEC) 10 MG tablet Take 1 tablet  (10 mg total) by mouth daily. 90 tablet 0    dapagliflozin propanediol (FARXIGA) 10 MG TABS tablet Take 1 tablet (10 mg total) by mouth daily before breakfast. 90 tablet 2    fluticasone (FLOVENT HFA) 110 MCG/ACT inhaler Inhale 2 puffs into the lungs 2 (two) times daily. 1 each 2    folic acid (FOLVITE) 1 MG tablet TAKE 1 TABLET(1 MG) BY MOUTH DAILY 90 tablet 0    gabapentin (NEURONTIN) 300 MG capsule Take 1 capsule (300 mg total) by mouth at bedtime. 90 capsule 3    hydrOXYzine (VISTARIL) 50 MG capsule Take 1 capsule (50 mg total) by mouth at bedtime as needed. 30 capsule 2    insulin glargine-yfgn (SEMGLEE) 100 UNIT/ML injection Inject 0.2 mLs (20 Units total) into the skin 2 (two) times daily. 10 mL 11    ipratropium-albuterol (DUONEB) 0.5-2.5 (3) MG/3ML SOLN Take 3 mLs by nebulization every 6 (six) hours as needed. 360 mL 0    losartan (COZAAR) 25 MG tablet Take 1 tablet (25 mg total) by mouth daily. 30 tablet 5    metFORMIN (GLUCOPHAGE) 500 MG tablet Take 2 tablets (1,000 mg total) by mouth daily with breakfast AND 1 tablet (500 mg total) daily with supper. 90 tablet  6    metoprolol succinate (TOPROL-XL) 25 MG 24 hr tablet Take 1 tablet (25 mg total) by mouth daily. 90 tablet 3    Multiple Vitamin (MULTIVITAMIN WITH MINERALS) TABS tablet Take 1 tablet by mouth every other day.       omega-3 acid ethyl esters (LOVAZA) 1 g capsule Take 1 capsule (1 g total) by mouth daily. 90 capsule 3    thiamine (VITAMIN B1) 100 MG tablet Take 1 tablet (100 mg total) by mouth daily. 90 tablet 0    torsemide (DEMADEX) 20 MG tablet Take 1 tablet (20 mg total) by mouth daily. 30 tablet 1    triamcinolone cream (KENALOG) 0.5 % Apply topically to legs twice daily for up to 2 weeks as needed for eczema 30 g 1    triamcinolone ointment (KENALOG) 0.1 % Apply 1 Application topically 2 (two) times daily. To affected area on abdomen 75 g 0    No current facility-administered medications for this encounter.   ROS   Review of Systems  Constitutional:  Positive for fatigue.  HENT:  Positive for sinus pain and sneezing.   Gastrointestinal:        RLQ ileostomy Abdominal hernia  Musculoskeletal:  Positive for gait problem.       Uses wheelchair in hospital  Skin:  Positive for color change, rash and wound.  Psychiatric/Behavioral: Negative.    All other systems reviewed and are negative.  Vital signs:  BP (!) 145/81   Pulse 97   Temp 98.2 F (36.8 C) (Oral)   Resp 18   SpO2 99%  Exam:  Physical Exam Vitals reviewed.  Constitutional:      Appearance: He is obese.  HENT:     Mouth/Throat:     Mouth: Mucous membranes are moist.  Cardiovascular:     Rate and Rhythm: Normal rate and regular rhythm.     Pulses: Normal pulses.     Heart sounds: Normal heart sounds.  Pulmonary:     Effort: Pulmonary effort is normal.     Breath sounds: Wheezing present.     Comments: Shortness of breath with exertion Abdominal:     Hernia: A hernia is present.  Skin:    General: Skin is warm and dry.     Findings: Erythema, lesion and rash present.  Neurological:     Mental Status: He is alert and oriented to person, place, and time.  Psychiatric:        Mood and Affect: Mood normal.        Behavior: Behavior normal.     Stoma type/location:  RLQ ileostomy, flush, productive of liquid tan stool Stomal assessment/size:  1"  Peristomal assessment:  intact  creasing at 3 o'clock, filled with strip paste  Treatment options for stomal/peristomal skin: barrier ring and 1 piece flat pouch Output: liquid stool Ostomy pouching: 1pc. With barrier ring and skin tac Education provided:  Has sutures in abdominal area from biopsy completed by dermatology.  Will remove next week    Impression/dx  Abdominal hernia Midline surgical wound, nonhealing ileostomy Discussion  Pouch change and wound care performed  Medical adhesive related skin injury to periwound skin from 4 years of adhesive use.  Improving with  daily VASHE cleansing.  Aquacel to open wound  Plan  See back as needed.  One week to remove sutures    Visit time: 45 minutes.   Maple Hudson FNP-BC

## 2023-04-11 NOTE — Discharge Instructions (Signed)
Back next week to remove sutures

## 2023-04-12 ENCOUNTER — Other Ambulatory Visit (HOSPITAL_COMMUNITY): Payer: Self-pay

## 2023-04-12 ENCOUNTER — Other Ambulatory Visit: Payer: Self-pay | Admitting: *Deleted

## 2023-04-12 ENCOUNTER — Other Ambulatory Visit: Payer: Self-pay

## 2023-04-12 MED ORDER — TORSEMIDE 20 MG PO TABS
20.0000 mg | ORAL_TABLET | Freq: Every day | ORAL | 3 refills | Status: DC
  Filled 2023-04-12: qty 30, 30d supply, fill #0

## 2023-04-12 NOTE — Patient Outreach (Signed)
Care Coordination  04/12/2023  Luis [name] [birth year] [id number]    RNCM unable to complete initial assessment today due to time limitations and assisting patient with contacting [facility]. RNCM will follow up on 04/14/23 to complete initial assessment.  [name] RN, BSN [place]  Value-Based Care [facility] RN Care Coordinator [phone]

## 2023-04-13 ENCOUNTER — Other Ambulatory Visit: Payer: Medicaid Other

## 2023-04-13 ENCOUNTER — Telehealth: Payer: Self-pay | Admitting: Pharmacist

## 2023-04-13 DIAGNOSIS — Z932 Ileostomy status: Secondary | ICD-10-CM | POA: Diagnosis not present

## 2023-04-13 DIAGNOSIS — S31109A Unspecified open wound of abdominal wall, unspecified quadrant without penetration into peritoneal cavity, initial encounter: Secondary | ICD-10-CM | POA: Diagnosis not present

## 2023-04-14 ENCOUNTER — Other Ambulatory Visit: Payer: Self-pay | Admitting: *Deleted

## 2023-04-14 DIAGNOSIS — Z419 Encounter for procedure for purposes other than remedying health state, unspecified: Secondary | ICD-10-CM | POA: Diagnosis not present

## 2023-04-14 NOTE — Patient Outreach (Signed)
Medicaid Managed Care   Nurse Care Manager Note  04/14/2023 Name:  Luis [name] MRN:  [id number] DOB:  [birth year]  Luis [name] is an 62 y.o. year old male who is a primary patient of Luis Paddy, NP.  The [facility] Managed Care Coordination team was consulted for assistance with:    Incisional Hernia  Luis [name] was given information about Medicaid Managed Care Coordination team services today. Luis [name] Patient agreed to services and verbal consent obtained.  Engaged with patient by telephone for initial visit in response to provider referral for case management and/or care coordination services.   Assessments/Interventions:  Review of past medical history, allergies, medications, health status, including review of consultants reports, laboratory and other test data, was performed as part of comprehensive evaluation and provision of chronic care management services.  SDOH (Social Determinants of Health) assessments and interventions performed: SDOH Interventions    Flowsheet Row Patient Outreach Telephone from 04/12/2023 in [place] POPULATION HEALTH DEPARTMENT  SDOH Interventions   Food Insecurity Interventions Other (Comment)  [BSW referral]  Housing Interventions Other (Comment)  [BSW referral]  Transportation Interventions Payor Benefit  Utilities Interventions Intervention Not Indicated       Care Plan  Allergies  Allergen Reactions   Lisinopril     Other reaction(s): renal effects   Codeine Other (See Comments) and Hives    unknown Other reaction(s): Other (See Comments) unknown unknown    Medications Reviewed Today   Medications were not reviewed in this encounter     Patient Active Problem List   Diagnosis Date Noted   At risk for falling 03/02/2023   Shortness of breath 03/02/2023   Needs flu shot 03/02/2023   HLD (hyperlipidemia) 02/19/2023   Demand ischemia (HCC) 02/19/2023   COPD with acute exacerbation (HCC) 02/16/2023   Acute  exacerbation of CHF (congestive heart failure) (HCC) 02/15/2023   Type 2 diabetes mellitus with diabetic polyneuropathy, without long-term current use of insulin (HCC) 01/20/2023   Chronic combined systolic and diastolic heart failure (HCC) 01/20/2023   Acute on chronic heart failure with preserved ejection fraction (HFpEF) (HCC) 12/23/2022   Encounter for smoking cessation counseling 12/09/2022   Prostate cancer screening 12/09/2022   Alcoholism (HCC) 12/09/2022   Abdominal wall hernia at previous stoma site 12/02/2022   Nonhealing surgical wound, subsequent encounter 11/24/2022   Abdominal hernia without obstruction and without gangrene 11/22/2022   Class 1 obesity with serious comorbidity and body mass index (BMI) of 32.0 to 32.9 in adult 11/04/2022   Insomnia 11/04/2022   Elevated hemoglobin (HCC) 11/04/2022   Smoker 11/04/2022   Irritant contact dermatitis associated with digestive stoma 11/02/2022   Ileostomy prolapse (HCC) 08/18/2022   Ileostomy present (HCC) 08/08/2022   Bilateral inguinal hernia without obstruction or gangrene 08/08/2022   Surgical wound, non healing 04/26/2022   Ileostomy dysfunction (HCC) 04/12/2022   Non-recurrent bilateral inguinal hernia without obstruction or gangrene 03/22/2022   Incisional hernia, without obstruction or gangrene    Nonhealing nonsurgical wound    Ileostomy stenosis (HCC)    Irritant contact dermatitis associated with fecal stoma    Ileostomy care [facility])    Pacemaker    AV block, 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   Chronic abdominal wound infection, subsequent encounter 12/26/2020   Alcohol dependence with uncomplicated withdrawal (HCC) 12/26/2020   Tobacco dependence 12/26/2020   Essential hypertension 12/26/2020   Intra-abdominal abscess (HCC) 08/03/2019   Type 2 diabetes mellitus with hyperlipidemia (HCC) 08/03/2019   History of DVT (deep vein  thrombosis) 08/03/2019    Conditions to be addressed/monitored per PCP order:   Incisional  Hernia  Care Plan : RN Care Manager Plan of Care  Updates made by Heidi Dach, RN since 04/14/2023 12:00 AM     Problem: Health Management needs related to Incisional Hernia      Long-Range Goal: Development of Plan of Care to address Health Management needs related to Incisional Hernia   Start Date: 04/12/2023  Expected End Date: 07/11/2023  Note:   Current Barriers:  Knowledge Deficits related to plan of care for management of Incisional Hernia Luis [name] would like to have abdominal surgery to reconnect his stomach muscles. He is needing an abdominal CT prior to scheduling surgery. He has concern regarding affording housing, food and utilities. He was denied disability  RNCM Clinical Goal(s):  Patient will verbalize understanding of plan for management of Incisional Hernia as evidenced by Patient reports take all medications exactly as prescribed and will call provider for medication related questions as evidenced by patient reports attend all scheduled medical appointments:  11/5 with Dr. Nancy Marus and LCSW, 04/20/23 with BSW, 11/13 with Pulmonology, 11/14 with PCP and 12/16 with Cardiology as evidenced by Provider documentation in EMR continue to work with RN Care Manager to address care management and care coordination needs related to  Incisional Hernia as evidenced by adherence to CM Team Scheduled appointments work with Child psychotherapist to address  related to the management of Financial constraints related to affording mortgage and utilities, Limited access to food, and Mental Health Concerns  related to the management of Incisional Hernia as evidenced by review of EMR and patient or Child psychotherapist report through collaboration with Medical illustrator, provider, and care team.   Interventions: Evaluation of current treatment plan related to  self management and patient's adherence to plan as established by provider   Incisional Hernia  (Status:  New goal.)  Long Term Goal Evaluation of  current treatment plan related to  Incisional Hernia ,  self-management and patient's adherence to plan as established by provider. Discussed plans with patient for ongoing care management follow up and provided patient with direct contact information for care management team Social Work referral for financial insecurities(Food, mortgage and utilities) Assessed social determinant of health barriers LCSW referral for assistance with mental health needs Assisted with contacting [facility] office [phone] to provide fax number for [facility] ([phone]-order for CT needs to be faxed and scheduled in [place]-second call made today Provided patient with education on Health Maintenance Provided education on quitting alcohol Advised patient to contact [facility] Member Services [phone] for member benefits  Patient Goals/Self-Care Activities: Take all medications as prescribed Attend all scheduled provider appointments Call provider office for new concerns or questions  Work with the social worker to address care coordination needs and will continue to work with the clinical team to address health care and disease management related needs  Follow Up Plan:  Telephone follow up appointment with care management team member scheduled for:  05/08/23 at 1:15pm      Follow Up:  Patient agrees to Care Plan and Follow-up.  Plan: The Managed Medicaid care management team will reach out to the patient again over the next 30 days.  Date/time of next scheduled RN care management/care coordination outreach:  05/08/23 at 1:15pm  [name] RN, BSN [place]  Value-Based Care [facility] RN Care Coordinator [phone]

## 2023-04-14 NOTE — Patient Instructions (Signed)
Visit Information  Luis [name] was given information about Medicaid Managed Care team care coordination services as a part of their [facility] Medicaid benefit. Luis [name] verbally consented to engagement with the [facility] Managed Care team.   If you are experiencing a medical emergency, please call 911 or report to your local emergency department or urgent care.   If you have a non-emergency medical problem during routine business hours, please contact your provider's office and ask to speak with a nurse.   For questions related to your [facility] health plan, please call: [phone] or go here:[email or web address]  If you would like to schedule transportation through your [facility] plan, please call the following number at least 2 days in advance of your appointment: [phone].   You can also use the MTM portal or MTM mobile app to manage your rides. Reimbursement for transportation is available through [facility]! For the portal, please go to [email or web address]  Call the [facility] Crisis Line at [phone], at any time, 24 hours a day, 7 days a week. If you are in danger or need immediate medical attention call 911.  If you would like help to quit smoking, call 1-800-QUIT-NOW (337-272-5501) OR Espaol: 1-855-Djelo-Ya (3-235-573-2202) o para ms informacin haga clic aqu or Text READY to 542-706 to register via text  Luis [name],   Please see education materials related to alcohol use and preventative care provided by MyChart link.  Patient verbalizes understanding of instructions and care plan provided today and agrees to view in MyChart. Active MyChart status and patient understanding of how to access instructions and care plan via MyChart confirmed with patient.     Telephone follow up appointment with Managed Medicaid care management team member scheduled for:05/08/23 at 1:15pm  Luis Emms RN, BSN [place]  Value-Based Care  [facility] RN Care Coordinator [phone]   Following is a copy of your plan of care:  Care Plan : RN Care Manager Plan of Care  Updates made by Heidi Dach, RN since 04/14/2023 12:00 AM     Problem: Health Management needs related to Incisional Hernia      Long-Range Goal: Development of Plan of Care to address Health Management needs related to Incisional Hernia   Start Date: 04/12/2023  Expected End Date: 07/11/2023  Note:   Current Barriers:  Knowledge Deficits related to plan of care for management of Incisional Hernia Luis [name] would like to have abdominal surgery to reconnect his stomach muscles. He is needing an abdominal CT prior to scheduling surgery. He has concern regarding affording housing, food and utilities. He was denied disability  RNCM Clinical Goal(s):  Patient will verbalize understanding of plan for management of Incisional Hernia as evidenced by Patient reports take all medications exactly as prescribed and will call provider for medication related questions as evidenced by patient reports attend all scheduled medical appointments:  11/5 with Dr. Nancy Marus and LCSW, 04/20/23 with BSW, 11/13 with Pulmonology, 11/14 with PCP and 12/16 with Cardiology as evidenced by Provider documentation in EMR continue to work with RN Care Manager to address care management and care coordination needs related to  Incisional Hernia as evidenced by adherence to CM Team Scheduled appointments work with social worker to address  related to the management of Financial constraints related to affording mortgage and utilities, Limited access to food, and Mental Health Concerns  related to the management of Incisional Hernia as evidenced by review of EMR and patient or Child psychotherapist report through collaboration with RN  Care manager, provider, and care team.   Interventions: Evaluation of current treatment plan related to  self management and patient's adherence to plan as  established by provider   Incisional Hernia  (Status:  New goal.)  Long Term Goal Evaluation of current treatment plan related to  Incisional Hernia ,  self-management and patient's adherence to plan as established by provider. Discussed plans with patient for ongoing care management follow up and provided patient with direct contact information for care management team Social Work referral for financial insecurities(Food, mortgage and utilities) Assessed social determinant of health barriers LCSW referral for assistance with mental health needs Assisted with contacting [facility] office [phone] to provide fax number for [facility] ([phone]-order for CT needs to be faxed and scheduled in [place]-second call made today Provided patient with education on Health Maintenance Provided education on quitting alcohol Advised patient to contact [facility] Member Services [phone] for member benefits  Patient Goals/Self-Care Activities: Take all medications as prescribed Attend all scheduled provider appointments Call provider office for new concerns or questions  Work with the social worker to address care coordination needs and will continue to work with the clinical team to address health care and disease management related needs  Follow Up Plan:  Telephone follow up appointment with care management team member scheduled for:  05/08/23 at 1:15pm

## 2023-04-17 ENCOUNTER — Other Ambulatory Visit (HOSPITAL_COMMUNITY): Payer: Self-pay | Admitting: Gastroenterology

## 2023-04-17 DIAGNOSIS — K432 Incisional hernia without obstruction or gangrene: Secondary | ICD-10-CM

## 2023-04-18 ENCOUNTER — Other Ambulatory Visit: Payer: Self-pay | Admitting: Licensed Clinical Social Worker

## 2023-04-18 ENCOUNTER — Ambulatory Visit (HOSPITAL_COMMUNITY)
Admission: RE | Admit: 2023-04-18 | Discharge: 2023-04-18 | Disposition: A | Discharge status: Home / Self Care | Payer: Medicaid Other | Source: Ambulatory Visit | Attending: Nurse Practitioner | Admitting: Nurse Practitioner

## 2023-04-18 DIAGNOSIS — K4020 Bilateral inguinal hernia, without obstruction or gangrene, not specified as recurrent: Secondary | ICD-10-CM

## 2023-04-18 DIAGNOSIS — Z7689 Persons encountering health services in other specified circumstances: Secondary | ICD-10-CM | POA: Diagnosis not present

## 2023-04-18 DIAGNOSIS — Z432 Encounter for attention to ileostomy: Secondary | ICD-10-CM | POA: Insufficient documentation

## 2023-04-18 DIAGNOSIS — L24B3 Irritant contact dermatitis related to fecal or urinary stoma or fistula: Secondary | ICD-10-CM | POA: Insufficient documentation

## 2023-04-18 NOTE — Progress Notes (Signed)
[facility]   Reason for visit:  RLQ ileostomy Abdominal hernia Nonhealing wound midline abdomen HPI:  Perforated diverticulum with end ileostomy Past Medical History:  Diagnosis Date  . Acute respiratory failure with hypoxia (HCC) 12/23/2022  . Alcohol withdrawal syndrome, with delirium (HCC) 12/27/2022  . Diabetes mellitus without complication (HCC)   . Diverticulitis   . DVT (deep venous thrombosis) (HCC)    x3  . Encephalopathy acute 12/28/2022  . ETOH abuse   . H/O colectomy   . Hyperlipidemia   . Hypertension   . Sepsis associated hypotension (HCC) 08/03/2019  . Smoker    Family History  Problem Relation Age of Onset  . COPD Mother   . Prostate cancer Father   . Colon cancer Neg Hx   . Rectal cancer Neg Hx   . Stomach cancer Neg Hx   . Esophageal cancer Neg Hx    Allergies  Allergen Reactions  . Lisinopril     Other reaction(s): renal effects  . Codeine Other (See Comments) and Hives    unknown Other reaction(s): Other (See Comments) unknown unknown   Current Outpatient Medications  Medication Sig Dispense Refill Last Dose  . acetaminophen (ACETAMINOPHEN 8 HOUR) 650 MG CR tablet Take 1 tablet (650 mg total) by mouth every 8 (eight) hours as needed for pain 90 tablet 1   . acetaminophen (TYLENOL) 650 MG CR tablet Take 1,300 mg by mouth every 8 (eight) hours as needed for pain.     Marland Kitchen amLODipine (NORVASC) 10 MG tablet Take 1 tablet (10 mg total) by mouth daily. 90 tablet 0   . arformoterol (BROVANA) 15 MCG/2ML NEBU Take 2 mLs (15 mcg total) by nebulization 2 (two) times daily. 120 mL 1   . ascorbic acid (VITAMIN C) 500 MG tablet Take 1 tablet (500 mg total) by mouth daily. 30 tablet 1   . atorvastatin (LIPITOR) 10 MG tablet Take 1 tablet (10 mg total) by mouth daily. 90 tablet 0   . buPROPion (WELLBUTRIN SR) 150 MG 12 hr tablet Take 1 tablet (150 mg total) by mouth 2 (two) times daily in the morning and evening. (Patient not taking: Reported on  04/12/2023) 60 tablet 2   . cetirizine (ZYRTEC) 10 MG tablet Take 1 tablet (10 mg total) by mouth daily. 90 tablet 0   . dapagliflozin propanediol (FARXIGA) 10 MG TABS tablet Take 1 tablet (10 mg total) by mouth daily before breakfast. 90 tablet 2   . fluticasone (FLOVENT HFA) 110 MCG/ACT inhaler Inhale 2 puffs into the lungs 2 (two) times daily. 1 each 2   . folic acid (FOLVITE) 1 MG tablet TAKE 1 TABLET(1 MG) BY MOUTH DAILY 90 tablet 0   . gabapentin (NEURONTIN) 300 MG capsule Take 1 capsule (300 mg total) by mouth at bedtime. 90 capsule 3   . hydrOXYzine (VISTARIL) 50 MG capsule Take 1 capsule (50 mg total) by mouth at bedtime as needed. 30 capsule 2   . insulin glargine-yfgn (SEMGLEE) 100 UNIT/ML injection Inject 0.2 mLs (20 Units total) into the skin 2 (two) times daily. (Patient not taking: Reported on 04/12/2023) 10 mL 11   . ipratropium-albuterol (DUONEB) 0.5-2.5 (3) MG/3ML SOLN Take 3 mLs by nebulization every 6 (six) hours as needed. 360 mL 0   . losartan (COZAAR) 25 MG tablet Take 1 tablet (25 mg total) by mouth daily. 30 tablet 5   . metFORMIN (GLUCOPHAGE) 500 MG tablet Take 2 tablets (1,000 mg total) by mouth daily with  breakfast AND 1 tablet (500 mg total) daily with supper. 90 tablet 6   . metoprolol succinate (TOPROL-XL) 25 MG 24 hr tablet Take 1 tablet (25 mg total) by mouth daily. 90 tablet 3   . Multiple Vitamin (MULTIVITAMIN WITH MINERALS) TABS tablet Take 1 tablet by mouth every other day.      . omega-3 acid ethyl esters (LOVAZA) 1 g capsule Take 1 capsule (1 g total) by mouth daily. 90 capsule 3   . thiamine (VITAMIN B1) 100 MG tablet Take 1 tablet (100 mg total) by mouth daily. 90 tablet 0   . torsemide (DEMADEX) 20 MG tablet Take 1 tablet (20 mg total) by mouth daily. 30 tablet 1   . torsemide (DEMADEX) 20 MG tablet Take 1 tablet (20 mg total) by mouth daily. 30 tablet 3   . triamcinolone cream (KENALOG) 0.5 % Apply topically to legs twice daily for up to 2 weeks as needed  for eczema (Patient not taking: Reported on 04/12/2023) 30 g 1   . triamcinolone ointment (KENALOG) 0.1 % Apply 1 Application topically 2 (two) times daily. To affected area on abdomen (Patient not taking: Reported on 04/12/2023) 75 g 0    No current facility-administered medications for this encounter.   ROS  Review of Systems  Respiratory:  Positive for shortness of breath.   Cardiovascular:        Pacemaker  Gastrointestinal:        RLQ ileostomy Abdominal hernia  Musculoskeletal:  Positive for gait problem.       USes wheelchair in hospital  Skin:  Positive for color change, rash and wound.  Psychiatric/Behavioral: Negative.         Continues to drink daily COntinues to smoke daily.  All other systems reviewed and are negative. Vital signs:  BP 128/78 (BP Location: Right Arm)   Pulse 96   Temp 98.6 F (37 C) (Oral)   Resp 18   SpO2 98%  Exam:  Physical Exam Vitals reviewed.  Constitutional:      Appearance: He is obese.  Cardiovascular:     Rate and Rhythm: Normal rate and regular rhythm.  Pulmonary:     Breath sounds: Wheezing present.  Abdominal:     Hernia: A hernia is present.  Skin:    General: Skin is warm and dry.     Findings: Erythema and rash present.  Neurological:     General: No focal deficit present.     Mental Status: He is alert and oriented to person, place, and time.  Psychiatric:        Mood and Affect: Mood normal.        Behavior: Behavior normal.    Stoma type/location:  RLQ ileostomy Stomal assessment/size:  1" flush Peristomal assessment:  chronic dermatitis Treatment options for stomal/peristomal skin: barrier ring and 1 piece flat  Output: soft tan stool Ostomy pouching: 1pc. Education provided:  wound care performed to abdomen  Wash with VASHE, aquacel to open wounds, top with gauze and ABD pad.  Use minimal tape to avoid further medical adhesive related skin injury    Impression/dx  Contact dermatitis Ileostomy Abdominal  hernia Discussion  Wound care twice weekly.  See back as needed Plan  Assist with returning HIPAA form to release records.     Visit time: 50 minutes.   Maple Hudson FNP-BC

## 2023-04-18 NOTE — Patient Outreach (Signed)
Medicaid Managed Care Social Work Note  04/18/2023 Name:  Luis [name] MRN:  [id number] DOB:  June 10, 1961  Luis [name] is an 62 y.o. year old male who is a primary patient of [name], NP.  The Medicaid Managed Care Coordination team was consulted for assistance with:  Mental Health Counseling and Resources  Luis [name] was given information about Medicaid Managed Care Coordination team services today. Luis [name] Patient agreed to services and verbal consent obtained.  Engaged with patient  for by telephone forinitial visit in response to referral for case management and/or care coordination services.   Assessments/Interventions:  Review of past medical history, allergies, medications, health status, including review of consultants reports, laboratory and other test data, was performed as part of comprehensive evaluation and provision of chronic care management services.  SDOH: (Social Determinant of Health) assessments and interventions performed: SDOH Interventions    Flowsheet Row Patient Outreach Telephone from 04/18/2023 in [place] POPULATION HEALTH DEPARTMENT Patient Outreach Telephone from 04/12/2023 in  POPULATION HEALTH DEPARTMENT  SDOH Interventions    Food Insecurity Interventions -- Other (Comment)  [BSW referral]  Housing Interventions -- Other (Comment)  [BSW referral]  Transportation Interventions -- Payor Benefit  Utilities Interventions -- Intervention Not Indicated  Depression Interventions/Treatment  --  [Pt will consider therapy] --  Stress Interventions Provide Counseling  [Patient reports that he does have stress but does not feel like he wants to start treatment at this time or make that committment to therapy just yet] --       Advanced Directives Status:  See Care Plan for related entries.  Care Plan                 Allergies  Allergen Reactions   Lisinopril     Other reaction(s): renal effects   Codeine Other (See Comments) and  Hives    unknown Other reaction(s): Other (See Comments) unknown unknown    Medications Reviewed Today   Medications were not reviewed in this encounter     Patient Active Problem List   Diagnosis Date Noted   At risk for falling 03/02/2023   Shortness of breath 03/02/2023   Needs flu shot 03/02/2023   HLD (hyperlipidemia) 02/19/2023   Demand ischemia (HCC) 02/19/2023   COPD with acute exacerbation (HCC) 02/16/2023   Acute exacerbation of CHF (congestive heart failure) (HCC) 02/15/2023   Type 2 diabetes mellitus with diabetic polyneuropathy, without long-term current use of insulin (HCC) 01/20/2023   Chronic combined systolic and diastolic heart failure (HCC) 01/20/2023   Acute on chronic heart failure with preserved ejection fraction (HFpEF) (HCC) 12/23/2022   Encounter for smoking cessation counseling 12/09/2022   Prostate cancer screening 12/09/2022   Alcoholism (HCC) 12/09/2022   Abdominal wall hernia at previous stoma site 12/02/2022   Nonhealing surgical wound, subsequent encounter 11/24/2022   Abdominal hernia without obstruction and without gangrene 11/22/2022   Class 1 obesity with serious comorbidity and body mass index (BMI) of 32.0 to 32.9 in adult 11/04/2022   Insomnia 11/04/2022   Elevated hemoglobin (HCC) 11/04/2022   Smoker 11/04/2022   Irritant contact dermatitis associated with digestive stoma 11/02/2022   Ileostomy prolapse (HCC) 08/18/2022   Ileostomy present (HCC) 08/08/2022   Bilateral inguinal hernia without obstruction or gangrene 08/08/2022   Surgical wound, non healing 04/26/2022   Ileostomy dysfunction (HCC) 04/12/2022   Non-recurrent bilateral inguinal hernia without obstruction or gangrene 03/22/2022   Incisional hernia, without obstruction or gangrene    Nonhealing nonsurgical wound  Ileostomy stenosis (HCC)    Irritant contact dermatitis associated with fecal stoma    Ileostomy care [facility])    Pacemaker    AV block, 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   Chronic abdominal wound infection, subsequent encounter 12/26/2020   Alcohol dependence with uncomplicated withdrawal (HCC) 12/26/2020   Tobacco dependence 12/26/2020   Essential hypertension 12/26/2020   Intra-abdominal abscess (HCC) 08/03/2019   Type 2 diabetes mellitus with hyperlipidemia (HCC) 08/03/2019   History of DVT (deep vein thrombosis) 08/03/2019    Conditions to be addressed/monitored per PCP order:  Depression  Care Plan : LCSW Plan of Care  Updates made by Gustavus Bryant, LCSW since 04/18/2023 12:00 AM     Problem: Depression Identification (Depression)      Goal: Depressive Symptoms Identified   Start Date: 04/18/2023  Note:   Timeframe:  Short-Range Goal Priority:  High Start Date:   04/18/23             Expected End Date:  ongoing                     Follow Up Date-05/18/23 at 10 am  - keep 90 percent of scheduled appointments -consider counseling or psychiatry -consider bumping up your self-care  -consider creating a stronger support network   Why is this important?             Combatting depression may take some time.            If you don't feel better right away, don't give up on your treatment plan.    Current barriers:   Chronic Mental Health needs related to symptoms of alcohol abuse, depression, stress and anxiety. Patient requires Support, Education, Resources, Referrals, Advocacy, and Care Coordination, in order to meet Unmet Mental Health Needs and to find a therapist and psychiatrist. Patient will implement clinical interventions discussed today to decrease symptoms of depression and increase knowledge and/or ability of: coping skills. Mental Health Concerns and Social Isolation Chronic physical pain Patient lacks knowledge of available community counseling agencies and resources.  Clinical Goal(s): verbalize understanding of plan for management of Anxiety, Depression, and Stress symptoms and demonstrate a reduction in symptoms.  Patient will connect with a provider for ongoing mental health treatment, increase coping skills, healthy habits, self-management skills, and stress reduction        Clinical Interventions:  Assessed patient's previous and current treatment, coping skills, support system and barriers to care. Patient provided hx. Patient reports receiving substance abuse treatment in the past. Patient admits to ongoing alcohol usage. [facility] LCSW educated patient on healthy coping skills to implement into is daily routine to combat cravings and depressive symptoms once they arise.  Verbalization of feelings encouraged, motivational interviewing employed Emotional support provided, positive coping strategies explored. Establishing healthy boundaries emphasized and healthy self-care education provided Patient was educated on available mental health resources within his area that accept Medicaid and offer counseling and psychiatry. Patient was advised to contact the back of his insurance card for assistance with benefits as well. Patient educated on the difference between therapy and psychiatry per patient request Mailed packet of resources sent to patient today with available mental health resources within his area that accept Medicaid and offer the services that he is interested in. Packet included instructions for scheduling at [facility] as well as some crisis support resources and [facility]'s walk in clinic hours. Patient will review resources over the next 3-4 weeks and make a decision regarding if/  when and where he wishes to gain MH treatment at. Patient is still unsure if he wishes to pursue any BH treatment at this time but is agreeable to considering.  Emotional support provided. CBT intervention implemented regarding "being mentally fit" by combating negative thinking and replacing it with uplifting support, hope and positivity. Patient reports significant worsening pain impacting his ability to function appropriately and  carry out daily task. Assessed social determinant of health barriers Discussed use of relaxation techniques and/or diversional activities to assist with pain reduction (distraction, imagery, relaxation, massage, acupressure, TENS, heat, and cold application; Reviewed with patient prescribed pharmacological and nonpharmacological pain relief strategies. LCSW provided education on relaxation techniques such as meditation, deep breathing, massage, grounding exercises or yoga that can activate the body's relaxation response and ease symptoms of stress and anxiety. LCSW ask that when pt is struggling with difficult emotions and racing thoughts that they start this relaxation response process. LCSW provided extensive education on healthy coping skills for anxiety. SW used active and reflective listening, validated patient's feelings/concerns, and provided emotional support. Patient will work on implementing appropriate self-care habits into their daily routine such as: staying positive, writing a gratitude list, drinking water, staying active around the house, taking their medications as prescribed, combating negative thoughts or emotions and staying connected with their family and friends. Positive reinforcement provided for this decision to work on this. LCSW provided education on healthy sleep hygiene and what that looks like. LCSW encouraged patient to implement a night time routine into their schedule that works best for them and that they are able to maintain. Advised patient to implement deep breathing/grounding/meditation/self-care exercises into their nightly routine to combat racing thoughts at night. LCSW encouraged patient to wake up at the same time each day, make their sleeping environment comfortable, exercise when able, to limit naps and to not eat or drink anything right before bed.  Motivational Interviewing employed Depression screen reviewed  PHQ2/ PHQ9 completed or reviewed  Mindfulness or  Relaxation training provided Active listening / Reflection utilized  Advance Care and HCPOA education provided Emotional Support Provided Problem Solving /Task Center strategies reviewed Provided psychoeducation for mental health needs  Provided brief CBT  Reviewed mental health medications and discussed importance of compliance:  Quality of sleep assessed & Sleep Hygiene techniques promoted  Participation in counseling encouraged  Verbalization of feelings encouraged  Suicidal Ideation/Homicidal Ideation assessed: Patient denies SI/HI  Review resources, discussed options and provided patient information about  Mental Health Resources Relapse prevention education provided (AA meeting suggested but patient not agreeable even to virtual meetings at this time) Inter-disciplinary care team collaboration (see longitudinal plan of care)  Patient Goals/Self-Care Activities: Take medications as prescribed   Attend all scheduled provider appointments Call pharmacy for medication refills 3-7 days in advance of running out of medications Perform all self care activities independently  Perform IADL's (shopping, preparing meals, housekeeping, managing finances) independently Call provider office for new concerns or questions Work with the social worker to address care coordination needs and will continue to work with the clinical team to address health care and disease management related needs call 1-800-273-TALK (toll free, 24 hour hotline) If in a crisis, go to [facility] [address], [place] [phone]) Utilize healthy coping skills and supportive resources discussed Contact PCP with any questions or concerns Keep 90 percent of counseling appointments Call your insurance provider for more information about your Enhanced Benefits  Check out counseling resources provided  Accept all  calls from representative with Behavioral Health Providers in an  effort to establish ongoing mental health counseling and supportive services. Incorporate into daily practice - relaxation techniques, deep breathing exercises, and mindfulness meditation strategies. Talk about feelings with friends, family members, spiritual advisor, etc. Contact LCSW directly [phone]), if you have questions, need assistance, or if additional social work needs are identified between now and our next scheduled telephone outreach call. Call 988 for mental health hotline/crisis line if needed (24/7 available) Try techniques to reduce symptoms of anxiety/negative thinking (deep breathing, distraction, positive self talk, etc)  - develop a personal safety plan - develop a plan to deal with triggers like holidays, anniversaries - exercise at least 2 to 3 times per week - have a plan for how to handle bad days - journal feelings and what helps to feel better or worse - spend time or talk with others at least 2 to 3 times per week - watch for early signs of feeling worse - begin personal counseling - call and visit an old friend - check out volunteer opportunities - join a support group - laugh; watch a funny movie or comedian - learn and use visualization or guided imagery - perform a random act of kindness - practice relaxation or meditation daily - start or continue a personal journal - practice positive thinking and self-talk -continue with compliance of taking medication  -identify current effective and ineffective coping strategies.  -implement positive self-talk in care to increase self-esteem, confidence and feelings of control.  -consider alternative and complementary therapy approaches such as meditation, mindfulness or yoga.  Call your insurance provider to gain education on benefits if desired Call your primary care doctor if symptoms get worse  -journaling, prayer, worship services, meditation or pastoral counseling.  -increase participation in pleasurable  group activities such as hobbies, singing, sports or volunteering).  -consider the use of meditative movement therapy such as tai chi, yoga or qigong.  -start a regular daily exercise program based on tolerance, ability and patient choice to support positive thinking and activity    Follow Up Plan:  The patient has been provided with contact information for the care management team and has been advised to call with any mental health or health related questions or concerns.  The care management team will reach out to the patient again over the next 30 business  days.   If you are experiencing a Mental Health or Behavioral Health Crisis or need someone to talk to, please call the Suicide and Crisis Lifeline: 988    Patient Goals: Initial goal  Information about Drinking  High Scores (20+) on the Alcohol Use Identification Test Consider becoming involved in a structured program.  You should stop drinking if: You have tried to cut down before but have not been successful, or  You suffer from morning shakes during a heavy drinking period, or You have high blood pressure, or You are pregnant, or You have liver disease, or You are taking medicines that react with alcohol, or Your alcohol use is affecting your social relationships, or You have legal consequences like DUIs, or You call in sick to work, or You cannot take care of our children, or Someone close to you says you drink too much    How Much Alcohol is a Drink: Beer: 12 oz. = 1 drink 16 oz. = 1.3 drinks 22 oz. = 2 drinks 40 oz. = 3.3 drinks  Wine: 5 oz. = 1 drink 740 mL (25 oz.) bottle =  5 drinks Malt Liquor: 12 oz. = 1.5 drinks 16 oz. = 2 drinks 22 oz. = 2.5 drinks 40 oz. = 4.5 drinks  80-Proof Spirits - Hard Liquor: 1 shot = 1 drink 1 mixed drink = number of shots Can equal 1-3 drinks   What is Low-risk Drinking? Have no more than 2 drinks of alcohol per day Drink no more than 5 days per week Do not drink alcohol  drink alcohol when: You drive or operate machinery You are pregnant or breast feeding You are taking medications that interact with alcohol You have medical conditions made worse with alcohol You can stop or control your drinking      Identify Your Triggers for Drinking Parties Particular People Feeling lonely Feeling tense Family problems Feeling sad Feeling happy Feeling bored After work Problems sleeping Criticism Feelings of failure After being paid When others are drinking In bars When out for dinner After arguments Weekends Feeling restless Being in pain   Effects of High-Risk Drinking To the Brain: Aggressive, irrational behavior Arguments, violence Depression, nervousness Alcohol dependence, memory loss To the Nervous System: Trembling hands, tingling fingers Numbness, painful nerves Impaired sensation leading to falls Numb tingling toes To Your Lifestyle: Social, legal, medical problems Domestic trouble/relationship loss Job loss & financial problems Shortened life span Accidents and death from drunk driving   To the Face: Premature aging, drinker's nose Cancer of the throat & mouth To the Body: Frequent cold Reduced resistance to infection Increased risk of pneumonia Weakness of heart muscle Heart failure, anemia Impaired blood clotting Breast cancer Vitamin deficiency, bleeding Severe Inflammation of the stomach Vomiting, diarrhea, malnutrition Ulcer, inflammation of the pancreas Impaired sexual performance Birth defects, including deformities, retardation, and low birthweight   Ways to Cope Without Drinking Go home if you tend to drink after work Find another activity Switch to nonalcoholic beverages Change friends Join a Lexicographer Visit relatives Plan/take a trip Go for a walk Take up a hobby Listen to music Talk to a friend Reading What would you do if you had no worries about failing?         Good Reasons for  Drinking Less I will live longer - probably 8-10 years. I will sleep better. I will be happier. I will save a lot of money My relationships will improve. I will stay younger for longer. I will achieve more in my life There will be a greater chance that I will survive to a healthy old age with no premature damage to my brain.  I will be better at my job. I will be less likely to feel depressed and commit suicide (6 times less likely). I will be less likely to die of heart disease or cancer. Other people will respect me I will be less likely to get into trouble with the police. The possibility that I will die of liver disease will be dramatically reduced (12 times less likely). It will be less likely that I will die in a car accident (3 times less likely).   Strategies for Cutting Down Keep Track.  Find a way to keep track of how much you drink.  If you make a note of each drink before you drink it, this will help slow you down. Count and Measure.  Know the standard drink sizes.  Ask the bartender or server about the amount of alcohol in a mixed drink. Set Goals.  Decide how many days a week you will drink and how many drinks each day.  Pace and Space.  When you do drink, pace yourself.  Have no more than one drink with alcohol per hour.  Alternate "drink spacers" non-alcoholic drinks such as water, soda, or juice with drinks containing alcohol. Include Food.  Don't drink on an empty stomach.  Have some food so the alcohol will be absorbed more slowly into your system.  Avoid Triggers.  Avoid people, places, or activities that have led to drinking in the past.  Certain times of day or feelings may also be triggers.  Make a plan so you will know what you can do instead of drinking. Plan to Handle Urges.  When an urge hits, consider these options:  Remind yourself of your reasons for changing.  Or talk it through with someone you trust. Or get involved with a healthy, distracting activity.  Or,  "urge surf" - instead of fighting the feeling, accept it and ride it out, knowing it will soon crest like a wave and pass. Know Your "No".  Have a polite, convincing "no thanks" for those times when you may be offered a drink and don't want one.  The faster you can say no to these offers, the less likely you are to give in.  If you hesitate, it allows you time to think of excuses to go along.              Follow up:  Patient agrees to Care Plan and Follow-up.  Plan: The Managed Medicaid care management team will reach out to the patient again over the next 30 days.  Dickie La, BSW, MSW, LCSW Licensed Clinical Social Worker [employer]   [facility] [email or web address] Direct Dial: [phone]

## 2023-04-18 NOTE — Patient Instructions (Signed)
Visit Information  Luis [name] was given information about Medicaid Managed Care team care coordination services as a part of their [facility] Medicaid benefit. Luis [name] verbally consented to engagement with the [facility] Managed Care team.   If you are experiencing a medical emergency, please call 911 or report to your local emergency department or urgent care.   If you have a non-emergency medical problem during routine business hours, please contact your provider's office and ask to speak with a nurse.   For questions related to your [facility] health plan, please call: [phone] or go here:[email or web address]  If you would like to schedule transportation through your [facility] plan, please call the following number at least 2 days in advance of your appointment: [phone].   You can also use the MTM portal or MTM mobile app to manage your rides. Reimbursement for transportation is available through [facility]! For the portal, please go to [email or web address]  Call the [facility] Crisis Line at [phone], at any time, 24 hours a day, 7 days a week. If you are in danger or need immediate medical attention call 911.  If you would like help to quit smoking, call 1-800-QUIT-NOW (431-035-5914) OR Espaol: 1-855-Djelo-Ya (3-474-259-5638) o para ms informacin haga clic aqu or Text READY to 756-433 to register via text   Following is a copy of your plan of care:  Care Plan : LCSW Plan of Care  Updates made by Gustavus Bryant, LCSW since 04/18/2023 12:00 AM     Problem: Depression Identification (Depression)      Goal: Depressive Symptoms Identified   Start Date: 04/18/2023  Note:   Timeframe:  Short-Range Goal Priority:  High Start Date:   04/18/23             Expected End Date:  ongoing                     Follow Up Date-05/18/23 at 10 am  - keep 90 percent of scheduled appointments -consider counseling or psychiatry -consider bumping up your  self-care  -consider creating a stronger support network   Why is this important?             Combatting depression may take some time.            If you don't feel better right away, don't give up on your treatment plan.    Current barriers:   Chronic Mental Health needs related to symptoms of alcohol abuse, depression, stress and anxiety. Patient requires Support, Education, Resources, Referrals, Advocacy, and Care Coordination, in order to meet Unmet Mental Health Needs and to find a therapist and psychiatrist. Patient will implement clinical interventions discussed today to decrease symptoms of depression and increase knowledge and/or ability of: coping skills. Mental Health Concerns and Social Isolation Chronic physical pain Patient lacks knowledge of available community counseling agencies and resources.  Clinical Goal(s): verbalize understanding of plan for management of Anxiety, Depression, and Stress symptoms and demonstrate a reduction in symptoms. Patient will connect with a provider for ongoing mental health treatment, increase coping skills, healthy habits, self-management skills, and stress reduction        Patient Goals/Self-Care Activities: Take medications as prescribed   Attend all scheduled provider appointments Call pharmacy for medication refills 3-7 days in advance of running out of medications Perform all self care activities independently  Perform IADL's (shopping, preparing meals, housekeeping, managing finances) independently Call provider office for new concerns or questions Work with the social worker  to address care coordination needs and will continue to work with the clinical team to address health care and disease management related needs call 1-800-273-TALK (toll free, 24 hour hotline) If in a crisis, go to [facility] [address] [place] [phone]) Utilize healthy coping skills and supportive resources  discussed Contact PCP with any questions or concerns Keep 90 percent of counseling appointments Call your insurance provider for more information about your Enhanced Benefits  Check out counseling resources provided  Accept all calls from representative with Behavioral Health Providers in an effort to establish ongoing mental health counseling and supportive services. Incorporate into daily practice - relaxation techniques, deep breathing exercises, and mindfulness meditation strategies. Talk about feelings with friends, family members, spiritual advisor, etc. Contact LCSW directly [phone]), if you have questions, need assistance, or if additional social work needs are identified between now and our next scheduled telephone outreach call. Call 988 for mental health hotline/crisis line if needed (24/7 available) Try techniques to reduce symptoms of anxiety/negative thinking (deep breathing, distraction, positive self talk, etc)  - develop a personal safety plan - develop a plan to deal with triggers like holidays, anniversaries - exercise at least 2 to 3 times per week - have a plan for how to handle bad days - journal feelings and what helps to feel better or worse - spend time or talk with others at least 2 to 3 times per week - watch for early signs of feeling worse - begin personal counseling - call and visit an old friend - check out volunteer opportunities - join a support group - laugh; watch a funny movie or comedian - learn and use visualization or guided imagery - perform a random act of kindness - practice relaxation or meditation daily - start or continue a personal journal - practice positive thinking and self-talk -continue with compliance of taking medication  -identify current effective and ineffective coping strategies.  -implement positive self-talk in care to increase self-esteem, confidence and feelings of control.  -consider alternative and complementary  therapy approaches such as meditation, mindfulness or yoga.  Call your insurance provider to gain education on benefits if desired Call your primary care doctor if symptoms get worse  -journaling, prayer, worship services, meditation or pastoral counseling.  -increase participation in pleasurable group activities such as hobbies, singing, sports or volunteering).  -consider the use of meditative movement therapy such as tai chi, yoga or qigong.  -start a regular daily exercise program based on tolerance, ability and patient choice to support positive thinking and activity    Follow Up Plan:  The patient has been provided with contact information for the care management team and has been advised to call with any mental health or health related questions or concerns.  The care management team will reach out to the patient again over the next 30 business  days.   If you are experiencing a Mental Health or Behavioral Health Crisis or need someone to talk to, please call the Suicide and Crisis Lifeline: 988    Patient Goals: Initial goal  Information about Drinking  High Scores (20+) on the Alcohol Use Identification Test Consider becoming involved in a structured program.  You should stop drinking if: You have tried to cut down before but have not been successful, or  You suffer from morning shakes during a heavy drinking period, or You have high blood pressure, or You are pregnant, or You have liver disease, or You are taking  medicines that react with alcohol, or Your alcohol use is affecting your social relationships, or You have legal consequences like DUIs, or You call in sick to work, or You cannot take care of our children, or Someone close to you says you drink too much    How Much Alcohol is a Drink: Beer: 12 oz. = 1 drink 16 oz. = 1.3 drinks 22 oz. = 2 drinks 40 oz. = 3.3 drinks  Wine: 5 oz. = 1 drink 740 mL (25 oz.) bottle = 5 drinks Malt Liquor: 12 oz. = 1.5 drinks 16  oz. = 2 drinks 22 oz. = 2.5 drinks 40 oz. = 4.5 drinks  80-Proof Spirits - Hard Liquor: 1 shot = 1 drink 1 mixed drink = number of shots Can equal 1-3 drinks   What is Low-risk Drinking? Have no more than 2 drinks of alcohol per day Drink no more than 5 days per week Do not drink alcohol drink alcohol when: You drive or operate machinery You are pregnant or breast feeding You are taking medications that interact with alcohol You have medical conditions made worse with alcohol You can stop or control your drinking      Identify Your Triggers for Drinking Parties Particular People Feeling lonely Feeling tense Family problems Feeling sad Feeling happy Feeling bored After work Problems sleeping Criticism Feelings of failure After being paid When others are drinking In bars When out for dinner After arguments Weekends Feeling restless Being in pain   Effects of High-Risk Drinking To the Brain: Aggressive, irrational behavior Arguments, violence Depression, nervousness Alcohol dependence, memory loss To the Nervous System: Trembling hands, tingling fingers Numbness, painful nerves Impaired sensation leading to falls Numb tingling toes To Your Lifestyle: Social, legal, medical problems Domestic trouble/relationship loss Job loss & financial problems Shortened life span Accidents and death from drunk driving   To the Face: Premature aging, drinker's nose Cancer of the throat & mouth To the Body: Frequent cold Reduced resistance to infection Increased risk of pneumonia Weakness of heart muscle Heart failure, anemia Impaired blood clotting Breast cancer Vitamin deficiency, bleeding Severe Inflammation of the stomach Vomiting, diarrhea, malnutrition Ulcer, inflammation of the pancreas Impaired sexual performance Birth defects, including deformities, retardation, and low birthweight   Ways to Cope Without Drinking Go home if you tend to drink after  work Find another activity Switch to nonalcoholic beverages Change friends Join a Lexicographer Visit relatives Plan/take a trip Go for a walk Take up a hobby Listen to music Talk to a friend Reading What would you do if you had no worries about failing?         Good Reasons for Drinking Less I will live longer - probably 8-10 years. I will sleep better. I will be happier. I will save a lot of money My relationships will improve. I will stay younger for longer. I will achieve more in my life There will be a greater chance that I will survive to a healthy old age with no premature damage to my brain.  I will be better at my job. I will be less likely to feel depressed and commit suicide (6 times less likely). I will be less likely to die of heart disease or cancer. Other people will respect me I will be less likely to get into trouble with the police. The possibility that I will die of liver disease will be dramatically reduced (12 times less likely). It will be less likely that I will die  in a car accident (3 times less likely).   Strategies for Cutting Down Keep Track.  Find a way to keep track of how much you drink.  If you make a note of each drink before you drink it, this will help slow you down. Count and Measure.  Know the standard drink sizes.  Ask the bartender or server about the amount of alcohol in a mixed drink. Set Goals.  Decide how many days a week you will drink and how many drinks each day. Pace and Space.  When you do drink, pace yourself.  Have no more than one drink with alcohol per hour.  Alternate "drink spacers" non-alcoholic drinks such as water, soda, or juice with drinks containing alcohol. Include Food.  Don't drink on an empty stomach.  Have some food so the alcohol will be absorbed more slowly into your system.  Avoid Triggers.  Avoid people, places, or activities that have led to drinking in the past.  Certain times of day or feelings may  also be triggers.  Make a plan so you will know what you can do instead of drinking. Plan to Handle Urges.  When an urge hits, consider these options:  Remind yourself of your reasons for changing.  Or talk it through with someone you trust. Or get involved with a healthy, distracting activity.  Or, "urge surf" - instead of fighting the feeling, accept it and ride it out, knowing it will soon crest like a wave and pass. Know Your "No".  Have a polite, convincing "no thanks" for those times when you may be offered a drink and don't want one.  The faster you can say no to these offers, the less likely you are to give in.  If you hesitate, it allows you time to think of excuses to go along.           24- Hour Availability:    [facility]  [address] [place], [place] [phone] Crisis [phone]   [facility] [phone]  [facility]  [phone]    [facility]  [phone] (after hours)   Therapeutic Alternative/Mobile Crisis   6394356131   [place] National Suicide Hotline  442-528-5784 Len Child[place]) [place] [address]   Call [phone] for mental health emergencies   [facility]  [phone]);  [place] and [employer]  [phone]); [place], [place], [place], [place], [place], [place], [place]    [facility] for [facility] Residents For 24/7 walk-up access to mental health services for [facility] children (4+), adolescents and adults, please visit the [facility] located at [address] in [place], [place].  *[facility] also provides comprehensive outpatient behavioral health services in a variety of locations around the [place].  Connect With [place] [address] [place], [place] [address] HelpLine: 787-593-1814 or 1-614-117-2077  Get Directions  Find Help 24/7 By Phone Call our 24-hour HelpLine  at 272-452-2945 or [phone] for immediate assistance for mental health and substance abuse issues.  Walk-In Help [place]: [facility] (Ages 4 and Up) [place]: Emergency Dept., [facility] Additional Resources National Hopeline Network: 1-800-SUICIDE The National Suicide Prevention Lifeline: 1-800-273-TALK     The following coping skill education was provided for stress relief and mental health management: "When your car dies or a deadline looms, how do you respond? Long-term, low-grade or acute stress  takes a serious toll on your body and mind, so don't ignore feelings of constant tension. Stress is a natural part of life. However, too much stress can harm our health, especially if it continues every day. This is chronic stress and can put you at risk for heart problems like heart disease and depression. Understand what's happening inside your body and learn simple coping skills to combat the negative impacts of everyday stressors.  Types of Stress There are two types of stress: Emotional - types of emotional stress are relationship problems, pressure at work, financial worries, experiencing discrimination or having a major life change. Physical - Examples of physical stress include being sick having pain, not sleeping well, recovery from an injury or having an alcohol and drug use disorder. Fight or Flight Sudden or ongoing stress activates your nervous system and floods your bloodstream with adrenaline and cortisol, two hormones that raise blood pressure, increase heart rate and spike blood sugar. These changes pitch your body into a fight or flight response. That enabled our ancestors to outrun saber-toothed tigers, and it's helpful today for situations like dodging a car accident. But most modern chronic stressors, such as finances or a challenging relationship, keep your body in that heightened state, which hurts your  health. Effects of Too Much Stress If constantly under stress, most of [place] will eventually start to function less well.  Multiple studies link chronic stress to a higher risk of heart disease, stroke, depression, weight gain, memory loss and even premature death, so it's important to recognize the warning signals. Talk to your doctor about ways to manage stress if you're experiencing any of these symptoms: Prolonged periods of poor sleep. Regular, severe headaches. Unexplained weight loss or gain. Feelings of isolation, withdrawal or worthlessness. Constant anger and irritability. Loss of interest in activities. Constant worrying or obsessive thinking. Excessive alcohol or drug use. Inability to concentrate.  10 Ways to Cope with Chronic Stress It's key to recognize stressful situations as they occur because it allows you to focus on managing how you react. We all need to know when to close our eyes and take a deep breath when we feel tension rising. Use these tips to prevent or reduce chronic stress. 1. Rebalance Work and Home All work and no play? If you're spending too much time at the office, intentionally put more dates in your calendar to enjoy time for fun, either alone or with others. 2. Get Regular Exercise Moving your body on a regular basis balances the nervous system and increases blood circulation, helping to flush out stress hormones. Even a daily 20-minute walk makes a difference. Any kind of exercise can lower stress and improve your mood ? just pick activities that you enjoy and make it a regular habit. 3. Eat Well and Limit Alcohol and Stimulants Alcohol, nicotine and caffeine may temporarily relieve stress but have negative health impacts and can make stress worse in the long run. Well-nourished bodies cope better, so start with a good breakfast, add more organic fruits and vegetables for a well-balanced diet, avoid processed foods and sugar, try herbal tea and drink more  water. 4. Connect with Supportive People Talking face to face with another person releases hormones that reduce stress. Lean on those good listeners in your life. 5. Carve Out Hobby Time Do you enjoy gardening, reading, listening to music or some other creative pursuit? Engage in activities that bring you pleasure and joy; research shows that reduces stress by almost half and lowers  your heart rate, too. 6. Practice Meditation, Stress Reduction or Yoga Relaxation techniques activate a state of restfulness that counterbalances your body's fight-or-flight hormones. Even if this also means a 10-minute break in a long day: listen to music, read, go for a walk in nature, do a hobby, take a bath or spend time with a friend. Also consider doing a mindfulness exercise or try a daily deep breathing or imagery practice. Deep Breathing Slow, calm and deep breathing can help you relax. Try these steps to focus on your breathing and repeat as needed. Find a comfortable position and close your eyes. Exhale and drop your shoulders. Breathe in through your nose; fill your lungs and then your belly. Think of relaxing your body, quieting your mind and becoming calm and peaceful. Breathe out slowly through your nose, relaxing your belly. Think of releasing tension, pain, worries or distress. Repeat steps three and four until you feel relaxed. Imagery This involves using your mind to excite the senses -- sound, vision, smell, taste and feeling. This may help ease your stress. Begin by getting comfortable and then do some slow breathing. Imagine a place you love being at. It could be somewhere from your childhood, somewhere you vacationed or just a place in your imagination. Feel how it is to be in the place you're imagining. Pay attention to the sounds, air, colors, and who is there with you. This is a place where you feel cared for and loved. All is well. You are safe. Take in all the smells, sounds, tastes and  feelings. As you do, feel your body being nourished and healed. Feel the calm that surrounds you. Breathe in all the good. Breathe out any discomfort or tension. 7. Sleep Enough If you get less than seven to eight hours of sleep, your body won't tolerate stress as well as it could. If stress keeps you up at night, address the cause, and add extra meditation into your day to make up for the lost z's. Try to get seven to nine hours of sleep each night. Make a regular bedtime schedule. Keep your room dark and cool. Try to avoid computers, TV, cell phones and tablets before bed. 8. Bond with Connections You Enjoy Go out for a coffee with a friend, chat with a neighbor, call a family member, visit with a clergy member, or even hang out with your pet. Clinical studies show that spending even a short time with a companion animal can cut anxiety levels almost in half. 9. Take a Vacation Getting away from it all can reset your stress tolerance by increasing your mental and emotional outlook, which makes you a happier, more productive person upon return. Leave your cellphone and laptop at home! 10. See a Counselor, Coach or Therapist If negative thoughts overwhelm your ability to make positive changes, it's time to seek professional help. Make an appointment today--your health and life are worth it."  Dickie La, BSW, MSW, LCSW Licensed Clinical Social Worker [employer]   [facility] [email or web address] Direct Dial: [phone]

## 2023-04-19 ENCOUNTER — Other Ambulatory Visit: Payer: Self-pay | Admitting: Nurse Practitioner

## 2023-04-19 ENCOUNTER — Other Ambulatory Visit (HOSPITAL_COMMUNITY): Payer: Self-pay

## 2023-04-19 DIAGNOSIS — K469 Unspecified abdominal hernia without obstruction or gangrene: Secondary | ICD-10-CM | POA: Diagnosis not present

## 2023-04-19 DIAGNOSIS — T8189XA Other complications of procedures, not elsewhere classified, initial encounter: Secondary | ICD-10-CM | POA: Diagnosis not present

## 2023-04-19 DIAGNOSIS — G4700 Insomnia, unspecified: Secondary | ICD-10-CM

## 2023-04-20 ENCOUNTER — Other Ambulatory Visit: Payer: Self-pay

## 2023-04-20 ENCOUNTER — Other Ambulatory Visit (HOSPITAL_COMMUNITY): Payer: Self-pay

## 2023-04-20 MED ORDER — HYDROXYZINE PAMOATE 50 MG PO CAPS
50.0000 mg | ORAL_CAPSULE | Freq: Every evening | ORAL | 2 refills | Status: DC | PRN
  Filled 2023-04-20: qty 30, 30d supply, fill #0
  Filled 2023-05-21: qty 30, 30d supply, fill #1
  Filled 2023-07-07: qty 30, 30d supply, fill #2

## 2023-04-20 NOTE — Patient Instructions (Signed)
Visit Information  Luis [name] was given information about Medicaid Managed Care team care coordination services as a part of their [facility] Medicaid benefit. [name] verbally consented to engagement with the [facility] Managed Care team.   If you are experiencing a medical emergency, please call 911 or report to your local emergency department or urgent care.   If you have a non-emergency medical problem during routine business hours, please contact your provider's office and ask to speak with a nurse.   For questions related to your [facility] health plan, please call: [phone] or go here:[email or web address]  If you would like to schedule transportation through your [facility] plan, please call the following number at least 2 days in advance of your appointment: [phone].   You can also use the MTM portal or MTM mobile app to manage your rides. Reimbursement for transportation is available through [facility]! For the portal, please go to [email or web address]  Call the [facility] Crisis Line at [phone], at any time, 24 hours a day, 7 days a week. If you are in danger or need immediate medical attention call 911.  If you would like help to quit smoking, call 1-800-QUIT-NOW (3125313409) OR Espaol: 1-855-Djelo-Ya (3-557-322-0254) o para ms informacin haga clic aqu or Text READY to 270-623 to register via text  [name] - following are the goals we discussed in your visit today:   Goals Addressed   None      Social Worker will follow up in 30 days.   Gus Puma, Kenard Gower, MHA [facility]  Managed Medicaid Social Worker [phone]   Following is a copy of your plan of care:  There are no care plans that you recently modified to display for this patient.

## 2023-04-20 NOTE — Telephone Encounter (Signed)
Unable to reach patient for clinical pharmacist phone follow up.  Arbutus Leas, PharmD, BCPS Clinical Pharmacist [facility] [phone]

## 2023-04-20 NOTE — Patient Outreach (Signed)
Medicaid Managed Care Social Work Note  04/20/2023 Name:  Luis [name] MRN:  [id number] DOB:  September 05, 1960  Luis [name] is an 62 y.o. year old male who is a primary patient of [name], NP.  The [facility] Managed Care Coordination team was consulted for assistance with:  Community Resources   Mr. Lazarz was given information about Medicaid Managed Care Coordination team services today. [name] Patient agreed to services and verbal consent obtained.  Engaged with patient  for by telephone forinitial visit in response to referral for case management and/or care coordination services.   Assessments/Interventions:  Review of past medical history, allergies, medications, health status, including review of consultants reports, laboratory and other test data, was performed as part of comprehensive evaluation and provision of chronic care management services.  SDOH: (Social Determinant of Health) assessments and interventions performed: SDOH Interventions    Flowsheet Row Patient Outreach Telephone from 04/18/2023 in [place] POPULATION HEALTH DEPARTMENT Patient Outreach Telephone from 04/12/2023 in [place] POPULATION HEALTH DEPARTMENT  SDOH Interventions    Food Insecurity Interventions -- Other (Comment)  [BSW referral]  Housing Interventions -- Other (Comment)  [BSW referral]  Transportation Interventions -- Payor Benefit  Utilities Interventions -- Intervention Not Indicated  Depression Interventions/Treatment  --  [Pt will consider therapy] --  Stress Interventions Provide Counseling  [Patient reports that he does have stress but does not feel like he wants to start treatment at this time or make that committment to therapy just yet] --     BSW completed a telephone outreach with patient, he states he is in need of food and financial resources. Patient states he does receive foodstamps but it is not enough. Patient states he receieves 1200 from a trust fund and 682 monthly  from social security. Patient states he owns his home and does not owe on his taxes. BSW will mail patient resources for foods pantries and utility assistance.   Advanced Directives Status:  Not addressed in this encounter.  Care Plan                 Allergies  Allergen Reactions   Lisinopril     Other reaction(s): renal effects   Codeine Other (See Comments) and Hives    unknown Other reaction(s): Other (See Comments) unknown unknown    Medications Reviewed Today   Medications were not reviewed in this encounter     Patient Active Problem List   Diagnosis Date Noted   At risk for falling 03/02/2023   Shortness of breath 03/02/2023   Needs flu shot 03/02/2023   HLD (hyperlipidemia) 02/19/2023   Demand ischemia (HCC) 02/19/2023   COPD with acute exacerbation (HCC) 02/16/2023   Acute exacerbation of CHF (congestive heart failure) (HCC) 02/15/2023   Type 2 diabetes mellitus with diabetic polyneuropathy, without long-term current use of insulin (HCC) 01/20/2023   Chronic combined systolic and diastolic heart failure (HCC) 01/20/2023   Acute on chronic heart failure with preserved ejection fraction (HFpEF) (HCC) 12/23/2022   Encounter for smoking cessation counseling 12/09/2022   Prostate cancer screening 12/09/2022   Alcoholism (HCC) 12/09/2022   Abdominal wall hernia at previous stoma site 12/02/2022   Nonhealing surgical wound, subsequent encounter 11/24/2022   Abdominal hernia without obstruction and without gangrene 11/22/2022   Class 1 obesity with serious comorbidity and body mass index (BMI) of 32.0 to 32.9 in adult 11/04/2022   Insomnia 11/04/2022   Elevated hemoglobin (HCC) 11/04/2022   Smoker 11/04/2022   Irritant contact dermatitis  associated with digestive stoma 11/02/2022   Ileostomy prolapse (HCC) 08/18/2022   Ileostomy present (HCC) 08/08/2022   Bilateral inguinal hernia without obstruction or gangrene 08/08/2022   Surgical wound, non healing 04/26/2022    Ileostomy dysfunction (HCC) 04/12/2022   Non-recurrent bilateral inguinal hernia without obstruction or gangrene 03/22/2022   Incisional hernia, without obstruction or gangrene    Nonhealing nonsurgical wound    Ileostomy stenosis (HCC)    Irritant contact dermatitis associated with fecal stoma    Ileostomy care [facility])    Pacemaker    AV block, 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   Follow up:  Patient agrees to Care Plan and Follow-up.  Plan: The Managed Medicaid care management team will reach out to the patient again over the next 30 days.  Date/time of next scheduled Social Work care management/care coordination outreach:  05/23/23  Gus Puma, Kenard Gower, [facility]  Managed [facility] Social Worker [phone]

## 2023-04-20 NOTE — Progress Notes (Signed)
Remote pacemaker transmission.   

## 2023-04-22 ENCOUNTER — Ambulatory Visit (HOSPITAL_COMMUNITY)
Admission: RE | Admit: 2023-04-22 | Discharge: 2023-04-22 | Disposition: A | Discharge status: Home / Self Care | Payer: Medicaid Other | Source: Ambulatory Visit | Attending: Gastroenterology | Admitting: Gastroenterology

## 2023-04-22 DIAGNOSIS — N200 Calculus of kidney: Secondary | ICD-10-CM | POA: Diagnosis not present

## 2023-04-22 DIAGNOSIS — K432 Incisional hernia without obstruction or gangrene: Secondary | ICD-10-CM | POA: Insufficient documentation

## 2023-04-22 DIAGNOSIS — R160 Hepatomegaly, not elsewhere classified: Secondary | ICD-10-CM | POA: Diagnosis not present

## 2023-04-22 DIAGNOSIS — Z7689 Persons encountering health services in other specified circumstances: Secondary | ICD-10-CM | POA: Diagnosis not present

## 2023-04-22 DIAGNOSIS — K7460 Unspecified cirrhosis of liver: Secondary | ICD-10-CM | POA: Diagnosis not present

## 2023-04-22 MED ORDER — IOHEXOL 300 MG/ML  SOLN
75.0000 mL | Freq: Once | INTRAMUSCULAR | Status: AC | PRN
  Administered 2023-04-22: 75 mL via INTRAVENOUS

## 2023-04-22 MED ORDER — IOHEXOL 9 MG/ML PO SOLN: 500.0000 mL | ORAL | Status: AC

## 2023-04-25 ENCOUNTER — Ambulatory Visit (HOSPITAL_COMMUNITY): Payer: Medicaid Other | Admitting: Nurse Practitioner

## 2023-04-25 ENCOUNTER — Ambulatory Visit (HOSPITAL_COMMUNITY)
Admission: RE | Admit: 2023-04-25 | Discharge: 2023-04-25 | Disposition: A | Discharge status: Home / Self Care | Payer: Medicaid Other | Source: Ambulatory Visit | Attending: Nurse Practitioner | Admitting: Nurse Practitioner

## 2023-04-25 DIAGNOSIS — K469 Unspecified abdominal hernia without obstruction or gangrene: Secondary | ICD-10-CM | POA: Diagnosis not present

## 2023-04-25 DIAGNOSIS — K4020 Bilateral inguinal hernia, without obstruction or gangrene, not specified as recurrent: Secondary | ICD-10-CM | POA: Diagnosis not present

## 2023-04-25 DIAGNOSIS — Z716 Tobacco abuse counseling: Secondary | ICD-10-CM

## 2023-04-25 DIAGNOSIS — Z932 Ileostomy status: Secondary | ICD-10-CM | POA: Diagnosis not present

## 2023-04-25 DIAGNOSIS — L24B3 Irritant contact dermatitis related to fecal or urinary stoma or fistula: Secondary | ICD-10-CM

## 2023-04-25 DIAGNOSIS — Z7689 Persons encountering health services in other specified circumstances: Secondary | ICD-10-CM | POA: Diagnosis not present

## 2023-04-25 DIAGNOSIS — T8189XA Other complications of procedures, not elsewhere classified, initial encounter: Secondary | ICD-10-CM | POA: Diagnosis not present

## 2023-04-25 DIAGNOSIS — K9419 Other complications of enterostomy: Secondary | ICD-10-CM | POA: Diagnosis not present

## 2023-04-25 DIAGNOSIS — Y69 Unspecified misadventure during surgical and medical care: Secondary | ICD-10-CM | POA: Diagnosis not present

## 2023-04-25 NOTE — Progress Notes (Signed)
[facility]   Reason for visit:  RLQ ileostomy  nonhealing midline surgical wound  abdominal hernia HPI:  Perforated diverticulum with end ileostomy Past Medical History:  Diagnosis Date  . Acute respiratory failure with hypoxia (HCC) 12/23/2022  . Alcohol withdrawal syndrome, with delirium (HCC) 12/27/2022  . Diabetes mellitus without complication (HCC)   . Diverticulitis   . DVT (deep venous thrombosis) (HCC)    x3  . Encephalopathy acute 12/28/2022  . ETOH abuse   . H/O colectomy   . Hyperlipidemia   . Hypertension   . Sepsis associated hypotension (HCC) 08/03/2019  . Smoker    Family History  Problem Relation Age of Onset  . COPD Mother   . Prostate cancer Father   . Colon cancer Neg Hx   . Rectal cancer Neg Hx   . Stomach cancer Neg Hx   . Esophageal cancer Neg Hx    Allergies  Allergen Reactions  . Lisinopril     Other reaction(s): renal effects  . Codeine Other (See Comments) and Hives    unknown Other reaction(s): Other (See Comments) unknown unknown   Current Outpatient Medications  Medication Sig Dispense Refill Last Dose  . acetaminophen (ACETAMINOPHEN 8 HOUR) 650 MG CR tablet Take 1 tablet (650 mg total) by mouth every 8 (eight) hours as needed for pain 90 tablet 1   . acetaminophen (TYLENOL) 650 MG CR tablet Take 1,300 mg by mouth every 8 (eight) hours as needed for pain.     Marland Kitchen amLODipine (NORVASC) 10 MG tablet Take 1 tablet (10 mg total) by mouth daily. 90 tablet 0   . ascorbic acid (VITAMIN C) 500 MG tablet Take 1 tablet (500 mg total) by mouth daily. 30 tablet 1   . atorvastatin (LIPITOR) 10 MG tablet Take 1 tablet (10 mg total) by mouth daily. 90 tablet 0   . buPROPion (WELLBUTRIN SR) 150 MG 12 hr tablet Take 1 tablet (150 mg total) by mouth 2 (two) times daily in the morning and evening. (Patient not taking: Reported on 04/12/2023) 60 tablet 2   . cetirizine (ZYRTEC) 10 MG tablet Take 1 tablet (10 mg total) by mouth daily. 90 tablet 0    . dapagliflozin propanediol (FARXIGA) 10 MG TABS tablet Take 1 tablet (10 mg total) by mouth daily before breakfast. 90 tablet 2   . fluticasone (FLOVENT HFA) 110 MCG/ACT inhaler Inhale 2 puffs into the lungs 2 (two) times daily. 1 each 2   . folic acid (FOLVITE) 1 MG tablet TAKE 1 TABLET(1 MG) BY MOUTH DAILY 90 tablet 0   . gabapentin (NEURONTIN) 300 MG capsule Take 1 capsule (300 mg total) by mouth at bedtime. 90 capsule 3   . hydrOXYzine (VISTARIL) 50 MG capsule Take 1 capsule (50 mg total) by mouth at bedtime as needed. 30 capsule 2   . insulin glargine-yfgn (SEMGLEE) 100 UNIT/ML injection Inject 0.2 mLs (20 Units total) into the skin 2 (two) times daily. (Patient not taking: Reported on 04/12/2023) 10 mL 11   . ipratropium-albuterol (DUONEB) 0.5-2.5 (3) MG/3ML SOLN Take 3 mLs by nebulization every 6 (six) hours as needed. 360 mL 0   . losartan (COZAAR) 25 MG tablet Take 1 tablet (25 mg total) by mouth daily. 30 tablet 5   . metFORMIN (GLUCOPHAGE) 500 MG tablet Take 2 tablets (1,000 mg total) by mouth daily with breakfast AND 1 tablet (500 mg total) daily with supper. 90 tablet 6   . metoprolol succinate (TOPROL-XL) 25 MG  24 hr tablet Take 1 tablet (25 mg total) by mouth daily. 90 tablet 3   . Multiple Vitamin (MULTIVITAMIN WITH MINERALS) TABS tablet Take 1 tablet by mouth every other day.      . omega-3 acid ethyl esters (LOVAZA) 1 g capsule Take 1 capsule (1 g total) by mouth daily. 90 capsule 3   . thiamine (VITAMIN B1) 100 MG tablet Take 1 tablet (100 mg total) by mouth daily. 90 tablet 0   . torsemide (DEMADEX) 20 MG tablet Take 1 tablet (20 mg total) by mouth daily. 30 tablet 1   . torsemide (DEMADEX) 20 MG tablet Take 1 tablet (20 mg total) by mouth daily. 30 tablet 2   . triamcinolone cream (KENALOG) 0.5 % Apply topically to legs twice daily for up to 2 weeks as needed for eczema (Patient not taking: Reported on 04/12/2023) 30 g 1   . triamcinolone ointment (KENALOG) 0.1 % Apply 1  Application topically 2 (two) times daily. To affected area on abdomen 75 g 0    No current facility-administered medications for this encounter.   ROS  Review of Systems  Constitutional:  Positive for fatigue.  HENT:  Positive for dental problem.        Ongoing dental issues.  Has multiple appointments coming  Respiratory:  Positive for chest tightness and shortness of breath.        Smoker Awaiting pulmonary function tests  Cardiovascular:  Positive for leg swelling.  Gastrointestinal:  Positive for abdominal pain.       RLQ ileostomy Abdominal hernia  Musculoskeletal:  Positive for gait problem.       Weakness Uses wheelchair in hospital  Skin:  Positive for color change, rash and wound.       Abdominal skin changes due to medical adhesive and tension from large abdominal hernia  Psychiatric/Behavioral:  Positive for dysphoric mood.   All other systems reviewed and are negative. Vital signs:  BP (!) 122/91   Pulse 91   Temp 98 F (36.7 C) (Oral)   SpO2 95%  Exam:  Physical Exam Vitals reviewed.  Constitutional:      Appearance: He is obese.  Cardiovascular:     Comments: pacemaker Pulmonary:     Breath sounds: Wheezing present.  Musculoskeletal:        General: Swelling present.     Comments: Weakness in lower legs    Stoma type/location:  RLQ end ileostomy, flush productive of soft brown stool Stomal assessment/size:  1" flush and pink Peristomal assessment:   erythema  abdominal creasing due to hernia Treatment options for stomal/peristomal skin: barrier ring and 1 piece flat pouch Output: soft brown stool Ostomy pouching: 1pc flat with barrier ring Education provided:  HAs been using triamcinolone cream to contact dermatitis on abdomen.  I inform him that topical steroid creams can thin the skin.  In anticipation of surgical repair of hernia, ostomy takedown, would use this sparingly.  Skin is improving with using VASHE to clean skin and minimizing tape to  periwound skin.     Impression/dx  Medical adhesive related skin injury to abdomen from long term use of tape on open abdominal wound. We are minimizing adhesives, showering at least weekly and allowing to air dry.  Aquacel to open wound, cover with gauze and ABD pads/tape  twice weekly.  Discussion  See above  ostomy and wound care twice weekly.  Plan  REcently had CT  abdomen and pelvis to assess hernia and results are  not available at the time of this visit.  He is anxious for reversal of ostomy and hernia repair.  I asked what strides he has made towards smoking and alcohol cessation and he has not been able to stop.  He has been given information on smoking cessation and declines medication to assist.     Visit time: 45 minutes.   Maple Hudson FNP-BC

## 2023-04-26 ENCOUNTER — Ambulatory Visit: Payer: Medicaid Other | Admitting: Pulmonary Disease

## 2023-04-26 ENCOUNTER — Encounter: Payer: Self-pay | Admitting: Pulmonary Disease

## 2023-04-26 VITALS — BP 130/83 | HR 90 | Temp 97.6°F | Ht 73.0 in | Wt 235.0 lb

## 2023-04-26 DIAGNOSIS — Z7689 Persons encountering health services in other specified circumstances: Secondary | ICD-10-CM | POA: Diagnosis not present

## 2023-04-26 DIAGNOSIS — F17210 Nicotine dependence, cigarettes, uncomplicated: Secondary | ICD-10-CM

## 2023-04-26 DIAGNOSIS — R0609 Other forms of dyspnea: Secondary | ICD-10-CM | POA: Diagnosis not present

## 2023-04-26 NOTE — Patient Instructions (Signed)
Nice to meet you  Use the DuoNeb nebulizer solution 2-3 times every day.  See if it helps with your breathing.  If it helps please send me a message and let me know and I can recommend a longer acting inhaler to use every day.  Please schedule pulmonary function tests next available at your convenience so that we can try to figure out if there is a reason for the lungs are short of breath  I do think part of your symptoms are related to the heart as well as decreased activity over time leading to more shortness of breath.  I am happy to work with you to try to get you to surgeries as able.  Return to clinic in 3 months or sooner as needed with Dr. Judeth Horn

## 2023-04-26 NOTE — Progress Notes (Unsigned)
@Patient  ID: Luis [name], male    DOB: [birth year], 62 y.o.   MRN: [id number]  Chief Complaint  Patient presents with  . Consult    Shortness of breath    Referring provider: [name], NP  HPI:   62 y.o. man whom we are seeing for evaluation of dyspnea on exertion.  Multiple cardiology notes reviewed.  Documentation from hospitalizations reviewed.  He has been short of breath for some time.  Underwent significant surgeries couple years ago.  Since then been short of breath.  Less active.  Previously was very active.  He continues to smoke.  Dyspneic with minimal exertion.  At rest is okay.  12/2022 get a CT scan while hospitalized that showed small bilateral effusions and bilateral dependent atelectasis versus infiltrate.  He was treated for pneumonia.  Frankly it looks like atelectasis.  12/2022 he had TTE with grade 2 diastolic dysfunction and dilated LA.  02/2023 he had a chest x-ray that looks volume overloaded with small bilateral pleural effusions, interlobular septal thickening, and enlarged heart.  In terms of improving his dyspnea, he states things are better with decreased swelling in his legs.  He is increased his Lasix recently.  Breathing has improved.  He has tried nebulizers and inhalers.  Does not seem to help much.  Only using as needed.  We discussed a strategy using on a scheduled basis to see if there is any improvement throughout the day.  He expressed understanding.  Questionaires / Pulmonary Flowsheets:   ACT:      No data to display          MMRC:     No data to display          Epworth:      No data to display          Tests:   FENO:  No results found for: "NITRICOXIDE"  PFT:     No data to display          WALK:      No data to display          Imaging: Personally reviewed and as per EMR and discussion in this note CUP PACEART REMOTE DEVICE CHECK  Result Date: 04/04/2023 Scheduled remote reviewed. Normal device  function.  AM-VP 99.1%, VP only 0.4%.  AV conduction mode switch off, history of complete AV block per Epic. Next remote 91 days. - CS, CVRS   Lab Results: Personally reviewed CBC    Component Value Date/Time   WBC 10.9 (H) 02/20/2023 0448   RBC 3.92 (L) 02/20/2023 0448   HGB 13.6 02/20/2023 0448   HCT 41.2 02/20/2023 0448   PLT 213 02/20/2023 0448   MCV 105.1 (H) 02/20/2023 0448   MCH 34.7 (H) 02/20/2023 0448   MCHC 33.0 02/20/2023 0448   RDW 15.3 02/20/2023 0448   LYMPHSABS 1.7 02/15/2023 1745   MONOABS 0.8 02/15/2023 1745   EOSABS 0.2 02/15/2023 1745   BASOSABS 0.1 02/15/2023 1745    BMET    Component Value Date/Time   NA 136 03/20/2023 1058   NA 142 05/14/2021 1246   K 4.3 03/20/2023 1058   CL 95 (L) 03/20/2023 1058   CO2 27 03/20/2023 1058   GLUCOSE 245 (H) 03/20/2023 1058   BUN 30 (H) 03/20/2023 1058   BUN 11 05/14/2021 1246   CREATININE 1.35 (H) 03/20/2023 1058   CALCIUM 9.8 03/20/2023 1058   GFRNONAA 59 (L) 03/20/2023 1058   GFRAA >60 08/03/2019  0544    BNP    Component Value Date/Time   BNP 136.7 (H) 02/15/2023 1745    ProBNP No results found for: "PROBNP"  Specialty Problems       Pulmonary Problems   COPD with acute exacerbation (HCC)   Shortness of breath    Allergies  Allergen Reactions  . Lisinopril     Other reaction(s): renal effects  . Codeine Other (See Comments) and Hives    unknown Other reaction(s): Other (See Comments) unknown unknown    Immunization History  Administered Date(s) Administered  . Influenza, Seasonal, Injecte, Preservative Fre 03/02/2023  . PNEUMOCOCCAL CONJUGATE-20 03/02/2023    Past Medical History:  Diagnosis Date  . Acute respiratory failure with hypoxia (HCC) 12/23/2022  . Alcohol withdrawal syndrome, with delirium (HCC) 12/27/2022  . Diabetes mellitus without complication (HCC)   . Diverticulitis   . DVT (deep venous thrombosis) (HCC)    x3  . Encephalopathy acute 12/28/2022  . ETOH abuse   .  H/O colectomy   . Hyperlipidemia   . Hypertension   . Sepsis associated hypotension (HCC) 08/03/2019  . Smoker     Tobacco History: Social History   Tobacco Use  Smoking Status Every Day  . Current packs/day: 1.00  . Average packs/day: 1 pack/day for 44.0 years (44.0 ttl pk-yrs)  . Types: Cigarettes  Smokeless Tobacco Current   Ready to quit: Not Answered Counseling given: Not Answered   Continue to not smoke  Outpatient Encounter Medications as of 04/26/2023  Medication Sig  . acetaminophen (ACETAMINOPHEN 8 HOUR) 650 MG CR tablet Take 1 tablet (650 mg total) by mouth every 8 (eight) hours as needed for pain  . acetaminophen (TYLENOL) 650 MG CR tablet Take 1,300 mg by mouth every 8 (eight) hours as needed for pain.  Marland Kitchen amLODipine (NORVASC) 10 MG tablet Take 1 tablet (10 mg total) by mouth daily.  Marland Kitchen ascorbic acid (VITAMIN C) 500 MG tablet Take 1 tablet (500 mg total) by mouth daily.  Marland Kitchen atorvastatin (LIPITOR) 10 MG tablet Take 1 tablet (10 mg total) by mouth daily.  . cetirizine (ZYRTEC) 10 MG tablet Take 1 tablet (10 mg total) by mouth daily.  . dapagliflozin propanediol (FARXIGA) 10 MG TABS tablet Take 1 tablet (10 mg total) by mouth daily before breakfast.  . fluticasone (FLOVENT HFA) 110 MCG/ACT inhaler Inhale 2 puffs into the lungs 2 (two) times daily.  . folic acid (FOLVITE) 1 MG tablet TAKE 1 TABLET(1 MG) BY MOUTH DAILY  . gabapentin (NEURONTIN) 300 MG capsule Take 1 capsule (300 mg total) by mouth at bedtime.  . hydrOXYzine (VISTARIL) 50 MG capsule Take 1 capsule (50 mg total) by mouth at bedtime as needed.  Marland Kitchen ipratropium-albuterol (DUONEB) 0.5-2.5 (3) MG/3ML SOLN Take 3 mLs by nebulization every 6 (six) hours as needed.  Marland Kitchen losartan (COZAAR) 25 MG tablet Take 1 tablet (25 mg total) by mouth daily.  . metFORMIN (GLUCOPHAGE) 500 MG tablet Take 2 tablets (1,000 mg total) by mouth daily with breakfast AND 1 tablet (500 mg total) daily with supper.  . metoprolol succinate  (TOPROL-XL) 25 MG 24 hr tablet Take 1 tablet (25 mg total) by mouth daily.  . Multiple Vitamin (MULTIVITAMIN WITH MINERALS) TABS tablet Take 1 tablet by mouth every other day.   . omega-3 acid ethyl esters (LOVAZA) 1 g capsule Take 1 capsule (1 g total) by mouth daily.  Marland Kitchen thiamine (VITAMIN B1) 100 MG tablet Take 1 tablet (100 mg total) by mouth daily.  Marland Kitchen  torsemide (DEMADEX) 20 MG tablet Take 1 tablet (20 mg total) by mouth daily.  Marland Kitchen triamcinolone ointment (KENALOG) 0.1 % Apply 1 Application topically 2 (two) times daily. To affected area on abdomen  . [DISCONTINUED] arformoterol (BROVANA) 15 MCG/2ML NEBU Take 2 mLs (15 mcg total) by nebulization 2 (two) times daily.  Marland Kitchen buPROPion (WELLBUTRIN SR) 150 MG 12 hr tablet Take 1 tablet (150 mg total) by mouth 2 (two) times daily in the morning and evening. (Patient not taking: Reported on 04/12/2023)  . insulin glargine-yfgn (SEMGLEE) 100 UNIT/ML injection Inject 0.2 mLs (20 Units total) into the skin 2 (two) times daily. (Patient not taking: Reported on 04/12/2023)  . torsemide (DEMADEX) 20 MG tablet Take 1 tablet (20 mg total) by mouth daily.  Marland Kitchen triamcinolone cream (KENALOG) 0.5 % Apply topically to legs twice daily for up to 2 weeks as needed for eczema (Patient not taking: Reported on 04/12/2023)  . [DISCONTINUED] icosapent Ethyl (VASCEPA) 1 g capsule Take 2 capsules (2 g total) by mouth 2 (two) times daily.   No facility-administered encounter medications on file as of 04/26/2023.     Review of Systems  Review of Systems  No chest pain with exertion.  No orthopnea or PND.  Comprehensive review of systems otherwise negative. Physical Exam  BP 130/83   Pulse 90   Temp 97.6 F (36.4 C) (Oral)   Ht 6\' 1"  (1.854 m)   Wt 235 lb (106.6 kg)   SpO2 93%   BMI 31.00 kg/m   Wt Readings from Last 5 Encounters:  04/26/23 235 lb (106.6 kg)  03/29/23 236 lb 3.2 oz (107.1 kg)  03/06/23 228 lb 3.2 oz (103.5 kg)  03/05/23 228 lb 3.2 oz (103.5 kg)   03/02/23 228 lb 3.2 oz (103.5 kg)    BMI Readings from Last 5 Encounters:  04/26/23 31.00 kg/m  03/29/23 31.16 kg/m  03/06/23 30.11 kg/m  03/05/23 30.11 kg/m  03/02/23 30.11 kg/m     Physical Exam General: Sitting in chair, no acute distress Eyes: EOMI, no icterus Neck: Supple, no JVP Pulmonary: Clear, normal breathing Cardiovascular: Warm, trace edema Abdomen: Significantly distended hernia, ostomy in place MSK: No synovitis, no joint effusion Neuro: Normal gait, no weakness Psych: Normal mood, full affect   Assessment & Plan:   Dyspnea exertion: Suspect multifactorial related to deconditioning, extrathoracic restriction with his hernia, clear signs of pulmonary venous hypertension and pulmonary edema/pleural effusions on prior chest imaging implicates cardiac etiology, history of cigarette smoking.  Suspect lungs were a smaller percentage or smaller proportion of his symptoms.  PFTs for further evaluation.  Encouraged to use DuoNebs 2-3 times a day since he has these at home.  In the past these  seemed not of help much.  Tobacco abuse: Pack a day for the last 47 or so years.  Encouraged to quit.  Please see below. Smoking assessment and cessation counseling I have advised the patient to quit/stop smoking as soon as possible due to high risk for multiple medical problems.  It will also be very difficult for [place] to manage patient's  respiratory symptoms and status if we continue to expose her lungs to a known irritant.  Patient is or is not willing to quit smoking. I have advised the patient that we can assist and have options of nicotine replacement therapy, provided smoking cessation education today, provided smoking cessation counseling, and provided cessation resources. Follow-up next office visit office visit for assessment of smoking cessation.  I spent 3 minutes  in tobacco cessation counseling.   Return in about 3 months (around 07/27/2023) for f/u Dr.  Judeth Horn.   Karren Burly, MD 04/26/2023   This appointment required 64 minutes of patient care (this includes precharting, chart review, review of results, face-to-face care, etc.).

## 2023-04-27 ENCOUNTER — Ambulatory Visit: Payer: Medicaid Other | Admitting: Nurse Practitioner

## 2023-04-28 ENCOUNTER — Other Ambulatory Visit: Payer: Self-pay | Admitting: Student

## 2023-04-28 ENCOUNTER — Other Ambulatory Visit (HOSPITAL_COMMUNITY): Payer: Self-pay

## 2023-04-28 ENCOUNTER — Other Ambulatory Visit: Payer: Self-pay | Admitting: Nurse Practitioner

## 2023-04-28 MED ORDER — TORSEMIDE 20 MG PO TABS
20.0000 mg | ORAL_TABLET | Freq: Every day | ORAL | 2 refills | Status: DC
Start: 2023-04-28 — End: 2023-08-23
  Filled 2023-04-28 – 2023-05-13 (×2): qty 30, 30d supply, fill #0
  Filled 2023-06-13: qty 30, 30d supply, fill #1
  Filled 2023-07-17: qty 30, 30d supply, fill #2

## 2023-04-28 NOTE — Discharge Instructions (Signed)
FOllow up on CT Scan Appointment with pulmonology for shortness of breath on exertion.

## 2023-04-29 ENCOUNTER — Other Ambulatory Visit (HOSPITAL_COMMUNITY): Payer: Self-pay

## 2023-05-02 ENCOUNTER — Other Ambulatory Visit: Payer: Self-pay

## 2023-05-02 ENCOUNTER — Ambulatory Visit (HOSPITAL_COMMUNITY)
Admission: RE | Admit: 2023-05-02 | Discharge: 2023-05-02 | Disposition: A | Discharge status: Home / Self Care | Payer: Medicaid Other | Source: Ambulatory Visit | Attending: Nurse Practitioner | Admitting: Nurse Practitioner

## 2023-05-02 DIAGNOSIS — L24B3 Irritant contact dermatitis related to fecal or urinary stoma or fistula: Secondary | ICD-10-CM

## 2023-05-02 DIAGNOSIS — K4020 Bilateral inguinal hernia, without obstruction or gangrene, not specified as recurrent: Secondary | ICD-10-CM

## 2023-05-02 DIAGNOSIS — Z432 Encounter for attention to ileostomy: Secondary | ICD-10-CM

## 2023-05-02 DIAGNOSIS — Z932 Ileostomy status: Secondary | ICD-10-CM | POA: Diagnosis not present

## 2023-05-02 DIAGNOSIS — Z7689 Persons encountering health services in other specified circumstances: Secondary | ICD-10-CM | POA: Diagnosis not present

## 2023-05-02 NOTE — Progress Notes (Signed)
Luis [name]   Reason for visit:  RLQ ileostomy  Nonhealing abdominal wound Abdominal hernia  HPI:  Perforated diverticulum with end ileostomy Nonhealing abdominal wound Abdominal hernia  Past Medical History:  Diagnosis Date  . Acute respiratory failure with hypoxia (HCC) 12/23/2022  . Alcohol withdrawal syndrome, with delirium (HCC) 12/27/2022  . Diabetes mellitus without complication (HCC)   . Diverticulitis   . DVT (deep venous thrombosis) (HCC)    x3  . Encephalopathy acute 12/28/2022  . ETOH abuse   . H/O colectomy   . Hyperlipidemia   . Hypertension   . Sepsis associated hypotension (HCC) 08/03/2019  . Smoker    Family History  Problem Relation Age of Onset  . COPD Mother   . Prostate cancer Father   . Colon cancer Neg Hx   . Rectal cancer Neg Hx   . Stomach cancer Neg Hx   . Esophageal cancer Neg Hx    Allergies  Allergen Reactions  . Lisinopril     Other reaction(s): renal effects  . Codeine Other (See Comments) and Hives    unknown Other reaction(s): Other (See Comments) unknown unknown   Current Outpatient Medications  Medication Sig Dispense Refill Last Dose  . acetaminophen (ACETAMINOPHEN 8 HOUR) 650 MG CR tablet Take 1 tablet (650 mg total) by mouth every 8 (eight) hours as needed for pain 90 tablet 1   . acetaminophen (TYLENOL) 650 MG CR tablet Take 1,300 mg by mouth every 8 (eight) hours as needed for pain.     Marland Kitchen amLODipine (NORVASC) 10 MG tablet Take 1 tablet (10 mg total) by mouth daily. 90 tablet 0   . ascorbic acid (VITAMIN C) 500 MG tablet Take 1 tablet (500 mg total) by mouth daily. 30 tablet 1   . atorvastatin (LIPITOR) 10 MG tablet Take 1 tablet (10 mg total) by mouth daily. 90 tablet 0   . buPROPion (WELLBUTRIN SR) 150 MG 12 hr tablet Take 1 tablet (150 mg total) by mouth 2 (two) times daily in the morning and evening. (Patient not taking: Reported on 04/12/2023) 60 tablet 2   . cetirizine (ZYRTEC) 10 MG tablet Take 1 tablet  (10 mg total) by mouth daily. 90 tablet 0   . dapagliflozin propanediol (FARXIGA) 10 MG TABS tablet Take 1 tablet (10 mg total) by mouth daily before breakfast. 90 tablet 2   . fluticasone (FLOVENT HFA) 110 MCG/ACT inhaler Inhale 2 puffs into the lungs 2 (two) times daily. 1 each 2   . folic acid (FOLVITE) 1 MG tablet TAKE 1 TABLET(1 MG) BY MOUTH DAILY 90 tablet 0   . gabapentin (NEURONTIN) 300 MG capsule Take 1 capsule (300 mg total) by mouth at bedtime. 90 capsule 3   . hydrOXYzine (VISTARIL) 50 MG capsule Take 1 capsule (50 mg total) by mouth at bedtime as needed. 30 capsule 2   . insulin glargine-yfgn (SEMGLEE) 100 UNIT/ML injection Inject 0.2 mLs (20 Units total) into the skin 2 (two) times daily. (Patient not taking: Reported on 04/12/2023) 10 mL 11   . ipratropium-albuterol (DUONEB) 0.5-2.5 (3) MG/3ML SOLN Take 3 mLs by nebulization every 6 (six) hours as needed. 360 mL 0   . losartan (COZAAR) 25 MG tablet Take 1 tablet (25 mg total) by mouth daily. 30 tablet 5   . metFORMIN (GLUCOPHAGE) 500 MG tablet Take 2 tablets (1,000 mg total) by mouth daily with breakfast AND 1 tablet (500 mg total) daily with supper. 90 tablet 6   .  metoprolol succinate (TOPROL-XL) 25 MG 24 hr tablet Take 1 tablet (25 mg total) by mouth daily. 90 tablet 3   . Multiple Vitamin (MULTIVITAMIN WITH MINERALS) TABS tablet Take 1 tablet by mouth every other day.      . omega-3 acid ethyl esters (LOVAZA) 1 g capsule Take 1 capsule (1 g total) by mouth daily. 90 capsule 3   . thiamine (VITAMIN B1) 100 MG tablet Take 1 tablet (100 mg total) by mouth daily. 90 tablet 0   . torsemide (DEMADEX) 20 MG tablet Take 1 tablet (20 mg total) by mouth daily. 30 tablet 1   . torsemide (DEMADEX) 20 MG tablet Take 1 tablet (20 mg total) by mouth daily. 30 tablet 2   . triamcinolone cream (KENALOG) 0.5 % Apply topically to legs twice daily for up to 2 weeks as needed for eczema (Patient not taking: Reported on 04/12/2023) 30 g 1   .  triamcinolone ointment (KENALOG) 0.1 % Apply 1 Application topically 2 (two) times daily. To affected area on abdomen 75 g 0    No current facility-administered medications for this encounter.   ROS  Review of Systems  Constitutional:  Positive for fatigue.  Respiratory:  Positive for shortness of breath.   Cardiovascular:        Pacemaker  Musculoskeletal:  Positive for gait problem (uses wheelchair in hospital).  All other systems reviewed and are negative. Vital signs:  BP (!) 151/81 (BP Location: Right Arm)   Pulse 90   Temp 97.7 F (36.5 C) (Oral)   Resp 20   SpO2 93%  Exam:  Physical Exam Vitals reviewed.  Cardiovascular:     Rate and Rhythm: Normal rate and regular rhythm.  Pulmonary:     Breath sounds: Wheezing present.  Abdominal:     Hernia: A hernia is present.  Skin:    Findings: Lesion and rash present.  Neurological:     Mental Status: He is oriented to person, place, and time.  Psychiatric:        Mood and Affect: Mood normal.    Stoma type/location:  RLQ ileostomy  stoma has been very painful, he states.  Stomal assessment/size:  1" flush pink and moist, friable and tender to touch Peristomal assessment:  skin is red but intact.  He states the area was painful beneath the pouch.   Treatment options for stomal/peristomal skin: I cleanse the skin and pouch as normal.  I could not see any breakdown or irritation.  Output: soft yellow stool Ostomy pouching: 1pc.flat Education provided:  change pouch when it loses seal or becomes tender . Wound care provided.  Periwound skin improving with VASHE.    Impression/dx  Peristomal irritation Nonhealing wound  Discussion  Pouching and wound care as ordered.  Plan  See back as needed.     Visit time: 45 minutes.   Maple Hudson FNP-BC

## 2023-05-04 ENCOUNTER — Other Ambulatory Visit: Payer: Self-pay

## 2023-05-04 ENCOUNTER — Telehealth: Payer: Self-pay | Admitting: Nurse Practitioner

## 2023-05-04 ENCOUNTER — Other Ambulatory Visit (HOSPITAL_COMMUNITY): Payer: Self-pay

## 2023-05-04 DIAGNOSIS — E1169 Type 2 diabetes mellitus with other specified complication: Secondary | ICD-10-CM

## 2023-05-04 MED ORDER — FOLIC ACID 1 MG PO TABS
1.0000 mg | ORAL_TABLET | Freq: Every day | ORAL | 0 refills | Status: DC
  Filled 2023-05-04: qty 90, 90d supply, fill #0

## 2023-05-04 MED ORDER — METFORMIN HCL 500 MG PO TABS
ORAL_TABLET | ORAL | 3 refills | Status: DC
Start: 2023-05-04 — End: 2023-12-21
  Filled 2023-05-04: qty 90, 30d supply, fill #0
  Filled 2023-06-27: qty 270, 90d supply, fill #0
  Filled 2023-10-25: qty 90, 30d supply, fill #1

## 2023-05-04 NOTE — Telephone Encounter (Signed)
Prescription Request  05/04/2023  LOV: 03/02/2023  What is the name of the medication or equipment? Folic acid, metformin  Have you contacted your pharmacy to request a refill? Yes   Which pharmacy would you like this sent to?  [place] [facility] [address] [place] [address] Phone: [phone] Fax: [phone]    Patient notified that their request is being sent to the clinical staff for review and that they should receive a response within 2 business days.   Please advise at Mobile [phone] (mobile)

## 2023-05-04 NOTE — Telephone Encounter (Signed)
 Medication send in.

## 2023-05-08 ENCOUNTER — Other Ambulatory Visit: Payer: Self-pay | Admitting: *Deleted

## 2023-05-08 NOTE — Patient Instructions (Signed)
Visit Information  Mr. Uzzle was given information about Medicaid Managed Care team care coordination services as a part of their [facility] Medicaid benefit. [name] verbally consented to engagement with the [facility] Managed Care team.   If you are experiencing a medical emergency, please call 911 or report to your local emergency department or urgent care.   If you have a non-emergency medical problem during routine business hours, please contact your provider's office and ask to speak with a nurse.   For questions related to your [facility] health plan, please call: [phone] or go here:[email or web address]  If you would like to schedule transportation through your [facility] plan, please call the following number at least 2 days in advance of your appointment: [phone].   You can also use the MTM portal or MTM mobile app to manage your rides. Reimbursement for transportation is available through [facility]! For the portal, please go to [email or web address]  Call the [facility] Crisis Line at [phone], at any time, 24 hours a day, 7 days a week. If you are in danger or need immediate medical attention call 911.  If you would like help to quit smoking, call 1-800-QUIT-NOW (915-784-8408) OR Espaol: 1-855-Djelo-Ya (4-166-063-0160) o para ms informacin haga clic aqu or Text READY to 109-323 to register via text  Mr. Smullen,   Please see education materials related to HF provided by MyChart link.  Patient verbalizes understanding of instructions and care plan provided today and agrees to view in MyChart. Active MyChart status and patient understanding of how to access instructions and care plan via MyChart confirmed with patient.     Telephone follow up appointment with Managed Medicaid care management team member scheduled for:06/12/23 at 1:15pm  [name] RN, BSN [place]  Value-Based Care [facility] Health RN Care  Coordinator [phone]   Following is a copy of your plan of care:  Care Plan : RN Care Manager Plan of Care  Updates made by Heidi Dach, RN since 05/08/2023 12:00 AM     Problem: Health Management needs related to Incisional Hernia      Long-Range Goal: Development of Plan of Care to address Health Management needs related to Incisional Hernia   Start Date: 04/12/2023  Expected End Date: 07/11/2023  Note:   Current Barriers:  Knowledge Deficits related to plan of care for management of Incisional Hernia Mr. Eichenauer had the needed CT on 04/22/23. He has not received results.  RNCM Clinical Goal(s):  Patient will verbalize understanding of plan for management of Incisional Hernia as evidenced by Patient reports take all medications exactly as prescribed and will call provider for medication related questions as evidenced by patient reports attend all scheduled medical appointments:  11/26 with Wound Clinic, 12/3 with LCSW, 12/9 with BSW, 12/16 with Cardiology and 06/15/23 with PCP as evidenced by Provider documentation in EMR continue to work with RN Care Manager to address care management and care coordination needs related to  Incisional Hernia as evidenced by adherence to CM Team Scheduled appointments work with Child psychotherapist to address  related to the management of Financial constraints related to affording mortgage and utilities, Limited access to food, and Mental Health Concerns  related to the management of Incisional Hernia as evidenced by review of EMR and patient or social worker report through collaboration with Medical illustrator, provider, and care team.   Interventions: Evaluation of current treatment plan related to  self management and patient's adherence to plan as established by provider  Incisional Hernia  (Status:  Goal on track:  Yes.)  Long Term Goal Evaluation of current treatment plan related to  Incisional Hernia ,  self-management and patient's adherence to  plan as established by provider. Discussed plans with patient for ongoing care management follow up and provided patient with direct contact information for care management team Assessed social determinant of health barriers Advised patient to contact [facility] office [phone] to discuss CT results Provided patient with education on Health Maintenance Provided education on quitting alcohol Advised patient to contact [facility] Member Services [phone] for member benefits  Patient Goals/Self-Care Activities: Take all medications as prescribed Attend all scheduled provider appointments Call provider office for new concerns or questions  Work with the social worker to address care coordination needs and will continue to work with the clinical team to address health care and disease management related needs  Follow Up Plan:  Telephone follow up appointment with care management team member scheduled for:  06/12/23 at 1:15pm

## 2023-05-08 NOTE — Patient Outreach (Signed)
Medicaid Managed Care   Nurse Care Manager Note  05/08/2023 Name:  Luis [name] MRN:  [id number] DOB:  11/20/1960  Luis [name] is an 62 y.o. year old male who is a primary patient of [name], NP.  The [facility] Managed Care Coordination team was consulted for assistance with:    Incisional Hernia  Luis [name] was given information about Medicaid Managed Care Coordination team services today. Luis [name] Patient agreed to services and verbal consent obtained.  Engaged with patient by telephone for follow up visit in response to provider referral for case management and/or care coordination services.   Assessments/Interventions:  Review of past medical history, allergies, medications, health status, including review of consultants reports, laboratory and other test data, was performed as part of comprehensive evaluation and provision of chronic care management services.  SDOH (Social Determinants of Health) assessments and interventions performed: SDOH Interventions    Flowsheet Row Patient Outreach Telephone from 04/18/2023 in [place] POPULATION HEALTH DEPARTMENT Patient Outreach Telephone from 04/12/2023 in [place] POPULATION HEALTH DEPARTMENT  SDOH Interventions    Food Insecurity Interventions -- Other (Comment)  [BSW referral]  Housing Interventions -- Other (Comment)  [BSW referral]  Transportation Interventions -- Payor Benefit  Utilities Interventions -- Intervention Not Indicated  Depression Interventions/Treatment  --  [Pt will consider therapy] --  Stress Interventions Provide Counseling  [Patient reports that he does have stress but does not feel like he wants to start treatment at this time or make that committment to therapy just yet] --       Care Plan  Allergies  Allergen Reactions   Lisinopril     Other reaction(s): renal effects   Codeine Other (See Comments) and Hives    unknown Other reaction(s): Other (See Comments) unknown unknown     Medications Reviewed Today     Reviewed by Luis Dach, RN (Registered Nurse) on 05/08/23 at 1341  Med List Status: <None>   Medication Order Taking? Sig Documenting Provider Last Dose Status Informant  acetaminophen (ACETAMINOPHEN 8 HOUR) 650 MG CR tablet [id number] Yes Take 1 tablet (650 mg total) by mouth every 8 (eight) hours as needed for pain  Taking Active   acetaminophen (TYLENOL) 650 MG CR tablet [id number] Yes Take 1,300 mg by mouth every 8 (eight) hours as needed for pain. [provider] Taking Active Self, Pharmacy Records  amLODipine (NORVASC) 10 MG tablet [id number] Yes Take 1 tablet (10 mg total) by mouth daily. [name], NP Taking Active   ascorbic acid (VITAMIN C) 500 MG tablet [id number] Yes Take 1 tablet (500 mg total) by mouth daily.  Taking Active   atorvastatin (LIPITOR) 10 MG tablet [id number] Yes Take 1 tablet (10 mg total) by mouth daily. [name], NP Taking Active   buPROPion [facility] SR) 150 MG 12 hr tablet [id number] No Take 1 tablet (150 mg total) by mouth 2 (two) times daily in the morning and evening.  Patient not taking: Reported on 04/12/2023    Not Taking Active   cetirizine (ZYRTEC) 10 MG tablet [id number] Yes Take 1 tablet (10 mg total) by mouth daily. [name], NP Taking Active   dapagliflozin propanediol (FARXIGA) 10 MG TABS tablet [id number] Yes Take 1 tablet (10 mg total) by mouth daily before breakfast. [name], NP Taking Active   fluticasone (FLOVENT HFA) 110 MCG/ACT inhaler [id number] Yes Inhale 2 puffs into the lungs 2 (two) times daily. Barnetta Chapel, MD Taking Active Self, Pharmacy Records  folic acid (FOLVITE) 1 MG tablet [id number] Yes Take 1 tablet (1 mg total) by mouth daily. [name], NP Taking Active   gabapentin (NEURONTIN) 300 MG capsule [id number] Yes Take 1 capsule (300 mg total) by mouth at bedtime. Louann Sjogren, DPM Taking Active   hydrOXYzine (VISTARIL) 50 MG capsule [id number] Yes Take 1  capsule (50 mg total) by mouth at bedtime as needed. [name], NP Taking Active     Discontinued 03/15/23 1105 (Change in therapy)   insulin glargine-yfgn (SEMGLEE) 100 UNIT/ML injection [id number] No Inject 0.2 mLs (20 Units total) into the skin 2 (two) times daily.  Patient not taking: Reported on 04/12/2023    Not Taking Active   ipratropium-albuterol (DUONEB) 0.5-2.5 (3) MG/3ML SOLN [id number] Yes Take 3 mLs by nebulization every 6 (six) hours as needed. [name], NP Taking Active Self, Pharmacy Records  losartan (COZAAR) 25 MG tablet [id number] Yes Take 1 tablet (25 mg total) by mouth daily. Dorthula Nettles, DO Taking Active   metFORMIN (GLUCOPHAGE) 500 MG tablet [id number] Yes Take 2 tablets (1,000 mg total) by mouth daily with breakfast AND 1 tablet (500 mg total) daily with supper. [name], NP Taking Active   metoprolol succinate (TOPROL-XL) 25 MG 24 hr tablet [id number] Yes Take 1 tablet (25 mg total) by mouth daily. Dorthula Nettles, DO Taking Active   Multiple Vitamin (MULTIVITAMIN WITH MINERALS) TABS tablet [id number] Yes Take 1 tablet by mouth every other day.  [provider] Taking Active Self, Pharmacy Records  omega-3 acid ethyl esters (LOVAZA) 1 g capsule [id number] Yes Take 1 capsule (1 g total) by mouth daily. [name], NP Taking Active   thiamine (VITAMIN B1) 100 MG tablet [id number] Yes Take 1 tablet (100 mg total) by mouth daily.  Taking Active   torsemide (DEMADEX) 20 MG tablet [id number]  Take 1 tablet (20 mg total) by mouth daily. Noralee Stain, DO  Expired 03/22/23 2359   torsemide (DEMADEX) 20 MG tablet [id number] Yes Take 1 tablet (20 mg total) by mouth daily. [name], NP Taking Active   triamcinolone cream (KENALOG) 0.5 % [id number] No Apply topically to legs twice daily for up to 2 weeks as needed for eczema  Patient not taking: Reported on 04/12/2023    Not Taking Active   triamcinolone ointment (KENALOG) 0.1 % [id number] Yes Apply 1  Application topically 2 (two) times daily. To affected area on abdomen Gwenith Daily, MD Taking Active             Patient Active Problem List   Diagnosis Date Noted   At risk for falling 03/02/2023   Shortness of breath 03/02/2023   Needs flu shot 03/02/2023   HLD (hyperlipidemia) 02/19/2023   Demand ischemia (HCC) 02/19/2023   COPD with acute exacerbation (HCC) 02/16/2023   Acute exacerbation of CHF (congestive heart failure) (HCC) 02/15/2023   Type 2 diabetes mellitus with diabetic polyneuropathy, without long-term current use of insulin (HCC) 01/20/2023   Chronic combined systolic and diastolic heart failure (HCC) 01/20/2023   Acute on chronic heart failure with preserved ejection fraction (HFpEF) (HCC) 12/23/2022   Encounter for smoking cessation counseling 12/09/2022   Prostate cancer screening 12/09/2022   Alcoholism (HCC) 12/09/2022   Abdominal wall hernia at previous stoma site 12/02/2022   Nonhealing surgical wound, subsequent encounter 11/24/2022   Abdominal hernia without obstruction and without gangrene 11/22/2022   Class 1 obesity with serious comorbidity and body mass index (BMI) of 32.0 to  32.9 in adult 11/04/2022   Insomnia 11/04/2022   Elevated hemoglobin (HCC) 11/04/2022   Smoker 11/04/2022   Irritant contact dermatitis associated with digestive stoma 11/02/2022   Ileostomy prolapse (HCC) 08/18/2022   Ileostomy present (HCC) 08/08/2022   Bilateral inguinal hernia without obstruction or gangrene 08/08/2022   Surgical wound, non healing 04/26/2022   Ileostomy dysfunction (HCC) 04/12/2022   Non-recurrent bilateral inguinal hernia without obstruction or gangrene 03/22/2022   Incisional hernia, without obstruction or gangrene    Nonhealing nonsurgical wound    Ileostomy stenosis (HCC)    Irritant contact dermatitis associated with fecal stoma    Ileostomy care (HCC)    Pacemaker    AV block, 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   Chronic abdominal wound infection,  subsequent encounter 12/26/2020   Alcohol dependence with uncomplicated withdrawal (HCC) 12/26/2020   Tobacco dependence 12/26/2020   Essential hypertension 12/26/2020   Intra-abdominal abscess (HCC) 08/03/2019   Type 2 diabetes mellitus with hyperlipidemia (HCC) 08/03/2019   History of DVT (deep vein thrombosis) 08/03/2019    Conditions to be addressed/monitored per PCP order:   Incisional Hernia  Care Plan : RN Care Manager Plan of Care  Updates made by Luis Dach, RN since 05/08/2023 12:00 AM     Problem: Health Management needs related to Incisional Hernia      Long-Range Goal: Development of Plan of Care to address Health Management needs related to Incisional Hernia   Start Date: 04/12/2023  Expected End Date: 07/11/2023  Note:   Current Barriers:  Knowledge Deficits related to plan of care for management of Incisional Hernia Mr. Szwed had the needed CT on 04/22/23. He has not received results.  RNCM Clinical Goal(s):  Patient will verbalize understanding of plan for management of Incisional Hernia as evidenced by Patient reports take all medications exactly as prescribed and will call provider for medication related questions as evidenced by patient reports attend all scheduled medical appointments:  11/26 with Wound Clinic, 12/3 with LCSW, 12/9 with BSW, 12/16 with Cardiology and 06/15/23 with PCP as evidenced by Provider documentation in EMR continue to work with RN Care Manager to address care management and care coordination needs related to  Incisional Hernia as evidenced by adherence to CM Team Scheduled appointments work with Child psychotherapist to address  related to the management of Financial constraints related to affording mortgage and utilities, Limited access to food, and Mental Health Concerns  related to the management of Incisional Hernia as evidenced by review of EMR and patient or Child psychotherapist report through collaboration with Medical illustrator, provider, and care  team.   Interventions: Evaluation of current treatment plan related to  self management and patient's adherence to plan as established by provider   Incisional Hernia  (Status:  Goal on track:  Yes.)  Long Term Goal Evaluation of current treatment plan related to  Incisional Hernia ,  self-management and patient's adherence to plan as established by provider. Discussed plans with patient for ongoing care management follow up and provided patient with direct contact information for care management team Assessed social determinant of health barriers Advised patient to contact [facility] office [phone] to discuss CT results Provided patient with education on Health Maintenance Provided education on quitting alcohol Advised patient to contact [facility] Member Services [phone] for member benefits  Patient Goals/Self-Care Activities: Take all medications as prescribed Attend all scheduled provider appointments Call provider office for new concerns or questions  Work with the social worker to address care coordination  needs and will continue to work with the clinical team to address health care and disease management related needs  Follow Up Plan:  Telephone follow up appointment with care management team member scheduled for:  06/12/23 at 1:15pm      Follow Up:  Patient agrees to Care Plan and Follow-up.  Plan: The Managed Medicaid care management team will reach out to the patient again over the next 30 days.  Date/time of next scheduled RN care management/care coordination outreach:  06/12/23 at 1:15pm  [name] RN, BSN [place]  Value-Based Care Institute [facility] Health RN Care Coordinator [phone]

## 2023-05-09 ENCOUNTER — Ambulatory Visit (HOSPITAL_COMMUNITY): Payer: Medicaid Other | Admitting: Nurse Practitioner

## 2023-05-13 ENCOUNTER — Other Ambulatory Visit (HOSPITAL_COMMUNITY): Payer: Self-pay

## 2023-05-14 DIAGNOSIS — Z419 Encounter for procedure for purposes other than remedying health state, unspecified: Secondary | ICD-10-CM | POA: Diagnosis not present

## 2023-05-16 ENCOUNTER — Other Ambulatory Visit: Payer: Self-pay | Admitting: Licensed Clinical Social Worker

## 2023-05-16 ENCOUNTER — Ambulatory Visit: Payer: Medicaid Other | Admitting: *Deleted

## 2023-05-16 ENCOUNTER — Other Ambulatory Visit (HOSPITAL_COMMUNITY): Payer: Self-pay

## 2023-05-16 ENCOUNTER — Ambulatory Visit (HOSPITAL_COMMUNITY)
Admission: RE | Admit: 2023-05-16 | Discharge: 2023-05-16 | Disposition: A | Discharge status: Home / Self Care | Payer: Medicaid Other | Source: Ambulatory Visit | Attending: Plastic Surgery | Admitting: Plastic Surgery

## 2023-05-16 DIAGNOSIS — Z7689 Persons encountering health services in other specified circumstances: Secondary | ICD-10-CM | POA: Diagnosis not present

## 2023-05-16 DIAGNOSIS — K4090 Unilateral inguinal hernia, without obstruction or gangrene, not specified as recurrent: Secondary | ICD-10-CM | POA: Diagnosis not present

## 2023-05-16 DIAGNOSIS — Z432 Encounter for attention to ileostomy: Secondary | ICD-10-CM | POA: Diagnosis not present

## 2023-05-16 DIAGNOSIS — T8189XA Other complications of procedures, not elsewhere classified, initial encounter: Secondary | ICD-10-CM | POA: Diagnosis not present

## 2023-05-16 DIAGNOSIS — L24B3 Irritant contact dermatitis related to fecal or urinary stoma or fistula: Secondary | ICD-10-CM | POA: Diagnosis not present

## 2023-05-16 DIAGNOSIS — Y838 Other surgical procedures as the cause of abnormal reaction of the patient, or of later complication, without mention of misadventure at the time of the procedure: Secondary | ICD-10-CM | POA: Insufficient documentation

## 2023-05-16 DIAGNOSIS — T8131XA Disruption of external operation (surgical) wound, not elsewhere classified, initial encounter: Secondary | ICD-10-CM

## 2023-05-16 DIAGNOSIS — K9413 Enterostomy malfunction: Secondary | ICD-10-CM

## 2023-05-16 DIAGNOSIS — Z932 Ileostomy status: Secondary | ICD-10-CM | POA: Diagnosis present

## 2023-05-16 NOTE — Patient Instructions (Signed)
Visit Information  Luis [name] was given information about Medicaid Managed Care team care coordination services as a part of their [facility] Medicaid benefit. Luis [name] verbally consented to engagement with the [facility] Managed Care team.   If you are experiencing a medical emergency, please call 911 or report to your local emergency department or urgent care.   If you have a non-emergency medical problem during routine business hours, please contact your provider's office and ask to speak with a nurse.   For questions related to your [facility] health plan, please call: [phone] or go here:[email or web address]  If you would like to schedule transportation through your [facility] plan, please call the following number at least 2 days in advance of your appointment: [phone].   You can also use the MTM portal or MTM mobile app to manage your rides. Reimbursement for transportation is available through [facility]! For the portal, please go to [email or web address]  Call the [facility] Crisis Line at [phone], at any time, 24 hours a day, 7 days a week. If you are in danger or need immediate medical attention call 911.  If you would like help to quit smoking, call 1-800-QUIT-NOW (225-861-1721) OR Espaol: 1-855-Djelo-Ya (4-132-440-1027) o para ms informacin haga clic aqu or Text READY to 253-664 to register via text  Following is a copy of your plan of care:  Care Plan : LCSW Plan of Care  Updates made by Gustavus Bryant, LCSW since 05/16/2023 12:00 AM     Problem: Depression Identification (Depression)      Goal: Depressive Symptoms Identified   Start Date: 04/18/2023  Note:   Timeframe:  Short-Range Goal Priority:  High Start Date:   04/18/23             Expected End Date:  ongoing                     Follow Up Date-No follow up date as resources were successfully received in the mail per pt on 05/16/23  - keep 90 percent of scheduled  appointments -consider counseling or psychiatry -consider bumping up your self-care  -consider creating a stronger support network   Why is this important?             Combatting depression may take some time.            If you don't feel better right away, don't give up on your treatment plan.    Current barriers:   Chronic Mental Health needs related to symptoms of alcohol abuse, depression, stress and anxiety. Patient requires Support, Education, Resources, Referrals, Advocacy, and Care Coordination, in order to meet Unmet Mental Health Needs and to find a therapist and psychiatrist. Patient will implement clinical interventions discussed today to decrease symptoms of depression and increase knowledge and/or ability of: coping skills. Mental Health Concerns and Social Isolation Chronic physical pain Patient lacks knowledge of available community counseling agencies and resources.  Clinical Goal(s): verbalize understanding of plan for management of Anxiety, Depression, and Stress symptoms and demonstrate a reduction in symptoms. Patient will connect with a provider for ongoing mental health treatment, increase coping skills, healthy habits, self-management skills, and stress reduction        Relapse prevention education provided (AA meeting suggested but patient not agreeable even to virtual meetings at this time)  Patient reports on 05/16/23 that he successfully received mental health and substance abuse resources in the mail and has considered these resources but denies wishing to pursue  any at this time but is open to keeping these resources to revert back to and utilize in the future if he desires. [facility] LCSW will update involved [facility] team members and will sign off at this time.    Patient Goals/Self-Care Activities: Take medications as prescribed   Attend all scheduled provider appointments Call pharmacy for medication refills 3-7 days in advance of running out of medications Perform all  self care activities independently  Perform IADL's (shopping, preparing meals, housekeeping, managing finances) independently Call provider office for new concerns or questions Work with the social worker to address care coordination needs and will continue to work with the clinical team to address health care and disease management related needs call 1-800-273-TALK (toll free, 24 hour hotline) If in a crisis, go to [facility] [address] [place] [phone]) Utilize healthy coping skills and supportive resources discussed Contact PCP with any questions or concerns Keep 90 percent of counseling appointments Call your insurance provider for more information about your Enhanced Benefits  Check out counseling resources provided  Accept all calls from representative with Behavioral Health Providers in an effort to establish ongoing mental health counseling and supportive services. Incorporate into daily practice - relaxation techniques, deep breathing exercises, and mindfulness meditation strategies. Talk about feelings with friends, family members, spiritual advisor, etc. Contact LCSW directly [phone]), if you have questions, need assistance, or if additional social work needs are identified between now and our next scheduled telephone outreach call. Call 988 for mental health hotline/crisis line if needed (24/7 available) Try techniques to reduce symptoms of anxiety/negative thinking (deep breathing, distraction, positive self talk, etc)  - develop a personal safety plan - develop a plan to deal with triggers like holidays, anniversaries - exercise at least 2 to 3 times per week - have a plan for how to handle bad days - journal feelings and what helps to feel better or worse - spend time or talk with others at least 2 to 3 times per week - watch for early signs of feeling worse - begin personal counseling - call and visit an old friend -  check out volunteer opportunities - join a support group - laugh; watch a funny movie or comedian - learn and use visualization or guided imagery - perform a random act of kindness - practice relaxation or meditation daily - start or continue a personal journal - practice positive thinking and self-talk -continue with compliance of taking medication  -identify current effective and ineffective coping strategies.  -implement positive self-talk in care to increase self-esteem, confidence and feelings of control.  -consider alternative and complementary therapy approaches such as meditation, mindfulness or yoga.  Call your insurance provider to gain education on benefits if desired Call your primary care doctor if symptoms get worse  -journaling, prayer, worship services, meditation or pastoral counseling.  -increase participation in pleasurable group activities such as hobbies, singing, sports or volunteering).  -consider the use of meditative movement therapy such as tai chi, yoga or qigong.  -start a regular daily exercise program based on tolerance, ability and patient choice to support positive thinking and activity    Follow Up Plan:  The patient has been provided with contact information for the care management team and has been advised to call with any mental health or health related questions or concerns.  The care management team will reach out to the patient again over the next 30 business  days.   If you are experiencing  a Mental Health or Behavioral Health Crisis or need someone to talk to, please call the Suicide and Crisis Lifeline: 988    Patient Goals: Initial goal  Information about Drinking  High Scores (20+) on the Alcohol Use Identification Test Consider becoming involved in a structured program.  You should stop drinking if: You have tried to cut down before but have not been successful, or  You suffer from morning shakes during a heavy drinking period, or You have  high blood pressure, or You are pregnant, or You have liver disease, or You are taking medicines that react with alcohol, or Your alcohol use is affecting your social relationships, or You have legal consequences like DUIs, or You call in sick to work, or You cannot take care of our children, or Someone close to you says you drink too much    How Much Alcohol is a Drink: Beer: 12 oz. = 1 drink 16 oz. = 1.3 drinks 22 oz. = 2 drinks 40 oz. = 3.3 drinks  Wine: 5 oz. = 1 drink 740 mL (25 oz.) bottle = 5 drinks Malt Liquor: 12 oz. = 1.5 drinks 16 oz. = 2 drinks 22 oz. = 2.5 drinks 40 oz. = 4.5 drinks  80-Proof Spirits - Hard Liquor: 1 shot = 1 drink 1 mixed drink = number of shots Can equal 1-3 drinks   What is Low-risk Drinking? Have no more than 2 drinks of alcohol per day Drink no more than 5 days per week Do not drink alcohol drink alcohol when: You drive or operate machinery You are pregnant or breast feeding You are taking medications that interact with alcohol You have medical conditions made worse with alcohol You can stop or control your drinking      Identify Your Triggers for Drinking Parties Particular People Feeling lonely Feeling tense Family problems Feeling sad Feeling happy Feeling bored After work Problems sleeping Criticism Feelings of failure After being paid When others are drinking In bars When out for dinner After arguments Weekends Feeling restless Being in pain   Effects of High-Risk Drinking To the Brain: Aggressive, irrational behavior Arguments, violence Depression, nervousness Alcohol dependence, memory loss To the Nervous System: Trembling hands, tingling fingers Numbness, painful nerves Impaired sensation leading to falls Numb tingling toes To Your Lifestyle: Social, legal, medical problems Domestic trouble/relationship loss Job loss & financial problems Shortened life span Accidents and death from drunk  driving   To the Face: Premature aging, drinker's nose Cancer of the throat & mouth To the Body: Frequent cold Reduced resistance to infection Increased risk of pneumonia Weakness of heart muscle Heart failure, anemia Impaired blood clotting Breast cancer Vitamin deficiency, bleeding Severe Inflammation of the stomach Vomiting, diarrhea, malnutrition Ulcer, inflammation of the pancreas Impaired sexual performance Birth defects, including deformities, retardation, and low birthweight   Ways to Cope Without Drinking Go home if you tend to drink after work Find another activity Switch to nonalcoholic beverages Change friends Join a Lexicographer Visit relatives Plan/take a trip Go for a walk Take up a hobby Listen to music Talk to a friend Reading What would you do if you had no worries about failing?         Good Reasons for Drinking Less I will live longer - probably 8-10 years. I will sleep better. I will be happier. I will save a lot of money My relationships will improve. I will stay younger for longer. I will achieve more in my life There will  be a greater chance that I will survive to a healthy old age with no premature damage to my brain.  I will be better at my job. I will be less likely to feel depressed and commit suicide (6 times less likely). I will be less likely to die of heart disease or cancer. Other people will respect me I will be less likely to get into trouble with the police. The possibility that I will die of liver disease will be dramatically reduced (12 times less likely). It will be less likely that I will die in a car accident (3 times less likely).   Strategies for Cutting Down Keep Track.  Find a way to keep track of how much you drink.  If you make a note of each drink before you drink it, this will help slow you down. Count and Measure.  Know the standard drink sizes.  Ask the bartender or server about the amount of alcohol in a  mixed drink. Set Goals.  Decide how many days a week you will drink and how many drinks each day. Pace and Space.  When you do drink, pace yourself.  Have no more than one drink with alcohol per hour.  Alternate "drink spacers" non-alcoholic drinks such as water, soda, or juice with drinks containing alcohol. Include Food.  Don't drink on an empty stomach.  Have some food so the alcohol will be absorbed more slowly into your system.  Avoid Triggers.  Avoid people, places, or activities that have led to drinking in the past.  Certain times of day or feelings may also be triggers.  Make a plan so you will know what you can do instead of drinking. Plan to Handle Urges.  When an urge hits, consider these options:  Remind yourself of your reasons for changing.  Or talk it through with someone you trust. Or get involved with a healthy, distracting activity.  Or, "urge surf" - instead of fighting the feeling, accept it and ride it out, knowing it will soon crest like a wave and pass. Know Your "No".  Have a polite, convincing "no thanks" for those times when you may be offered a drink and don't want one.  The faster you can say no to these offers, the less likely you are to give in.  If you hesitate, it allows you time to think of excuses to go along.            24- Hour Availability:    [facility]  [address] [place], [place] [phone] Crisis [phone]   [facility] [phone]  [facility]  [phone]    [facility]  [phone] (after hours)   Therapeutic Alternative/Mobile Crisis   [phone]   [place] National Suicide Hotline  (970) 356-4926 Len Childs) [place] 016   Call [phone] for mental health emergencies   [facility]  [phone]);  [place] and [employer]  [phone]); [place], [place], [place], [place], [place], [place], [place]    [facility] for [facility] Residents For 24/7 walk-up access to mental health services for [facility] children (4+), adolescents and adults, please visit the [facility] located at [address] in [place], [place].  *[place] also provides comprehensive outpatient behavioral health services in a variety of locations around the [place].  Connect With [place] [address] [place], [place] [address] HelpLine:  [phone] or 1-(312)812-8964  Get Directions  Find Help 24/7 By Phone Call our 24-hour HelpLine at 518-423-5455 or 920-614-9967 for immediate assistance for mental health and substance abuse issues.  Walk-In Help [place]: [facility] (Ages 4 and Up) [place]: Emergency Dept., [facility] Additional Resources National Hopeline Network: 1-800-SUICIDE The National Suicide Prevention Lifeline: 0-347-425-ZDGL     Dickie La, BSW, MSW, LCSW Licensed Clinical Social Worker [employer]   [facility] [email or web address] Direct Dial: [phone]

## 2023-05-16 NOTE — Progress Notes (Cosign Needed)
Luis [name]   Reason for visit:  RLQ ileostomy Nonhealing surgical wound Abdominal hernia HPI:  Perforated diverticulum with ileostomy Past Medical History:  Diagnosis Date   Acute respiratory failure with hypoxia (HCC) 12/23/2022   Alcohol withdrawal syndrome, with delirium (HCC) 12/27/2022   Diabetes mellitus without complication (HCC)    Diverticulitis    DVT (deep venous thrombosis) (HCC)    x3   Encephalopathy acute 12/28/2022   ETOH abuse    H/O colectomy    Hyperlipidemia    Hypertension    Sepsis associated hypotension (HCC) 08/03/2019   Smoker    Family History  Problem Relation Age of Onset   COPD Mother    Prostate cancer Father    Colon cancer Neg Hx    Rectal cancer Neg Hx    Stomach cancer Neg Hx    Esophageal cancer Neg Hx    Allergies  Allergen Reactions   Lisinopril     Other reaction(s): renal effects   Codeine Other (See Comments) and Hives    unknown Other reaction(s): Other (See Comments) unknown unknown   Current Outpatient Medications  Medication Sig Dispense Refill Last Dose   acetaminophen (ACETAMINOPHEN 8 HOUR) 650 MG CR tablet Take 1 tablet (650 mg total) by mouth every 8 (eight) hours as needed for pain 90 tablet 1    acetaminophen (TYLENOL) 650 MG CR tablet Take 1,300 mg by mouth every 8 (eight) hours as needed for pain.      amLODipine (NORVASC) 10 MG tablet Take 1 tablet (10 mg total) by mouth daily. 90 tablet 0    ascorbic acid (VITAMIN C) 500 MG tablet Take 1 tablet (500 mg total) by mouth daily. 30 tablet 1    atorvastatin (LIPITOR) 10 MG tablet Take 1 tablet (10 mg total) by mouth daily. 90 tablet 0    buPROPion (WELLBUTRIN SR) 150 MG 12 hr tablet Take 1 tablet (150 mg total) by mouth 2 (two) times daily in the morning and evening. (Patient not taking: Reported on 04/12/2023) 60 tablet 2    cetirizine (ZYRTEC) 10 MG tablet Take 1 tablet (10 mg total) by mouth daily. 90 tablet 0    dapagliflozin propanediol (FARXIGA)  10 MG TABS tablet Take 1 tablet (10 mg total) by mouth daily before breakfast. 90 tablet 2    fluticasone (FLOVENT HFA) 110 MCG/ACT inhaler Inhale 2 puffs into the lungs 2 (two) times daily. 1 each 2    folic acid (FOLVITE) 1 MG tablet Take 1 tablet (1 mg total) by mouth daily. 90 tablet 0    gabapentin (NEURONTIN) 300 MG capsule Take 1 capsule (300 mg total) by mouth at bedtime. 90 capsule 3    hydrOXYzine (VISTARIL) 50 MG capsule Take 1 capsule (50 mg total) by mouth at bedtime as needed. 30 capsule 2    insulin glargine-yfgn (SEMGLEE) 100 UNIT/ML injection Inject 0.2 mLs (20 Units total) into the skin 2 (two) times daily. (Patient not taking: Reported on 04/12/2023) 10 mL 11    ipratropium-albuterol (DUONEB) 0.5-2.5 (3) MG/3ML SOLN Take 3 mLs by nebulization every 6 (six) hours as needed. 360 mL 0    losartan (COZAAR) 25 MG tablet Take 1 tablet (25 mg total) by mouth daily. 30 tablet 5    metFORMIN (GLUCOPHAGE) 500 MG tablet Take 2 tablets (1,000 mg total) by mouth daily with breakfast AND 1 tablet (500 mg total) daily with supper. 90 tablet 3    metoprolol succinate (TOPROL-XL) 25 MG 24 hr  tablet Take 1 tablet (25 mg total) by mouth daily. 90 tablet 3    Multiple Vitamin (MULTIVITAMIN WITH MINERALS) TABS tablet Take 1 tablet by mouth every other day.       omega-3 acid ethyl esters (LOVAZA) 1 g capsule Take 1 capsule (1 g total) by mouth daily. 90 capsule 3    thiamine (VITAMIN B1) 100 MG tablet Take 1 tablet (100 mg total) by mouth daily. 90 tablet 0    torsemide (DEMADEX) 20 MG tablet Take 1 tablet (20 mg total) by mouth daily. 30 tablet 1    torsemide (DEMADEX) 20 MG tablet Take 1 tablet (20 mg total) by mouth daily. 30 tablet 2    triamcinolone cream (KENALOG) 0.5 % Apply topically to legs twice daily for up to 2 weeks as needed for eczema (Patient not taking: Reported on 04/12/2023) 30 g 1    triamcinolone ointment (KENALOG) 0.1 % Apply 1 Application topically 2 (two) times daily. To  affected area on abdomen 75 g 0    No current facility-administered medications for this encounter.   ROS  Review of Systems  Constitutional:  Positive for fatigue and unexpected weight change.       States he is retaining fluid. Noted shortness of breath with activity.   Respiratory:  Positive for cough (nonproductive cough) and shortness of breath.        Rechecked pulse ox at completion of visit  95%   Gastrointestinal:        RLQ ileostomy Parastomal hernia   Genitourinary:        States he continues to take one of his diuretic medications and continues to void hourly.   All other systems reviewed and are negative.  Vital signs:  BP (!) 152/96 (BP Location: Right Arm)   Pulse 98   Temp 98 F (36.7 C) (Oral)   Resp 18   SpO2 92%  Exam:  Physical Exam Vitals (oxygen saturation improved at conclusion of visit  shortness of breath with exertion, medication noncomplicance) reviewed.  Constitutional:      Appearance: He is obese.  Cardiovascular:     Rate and Rhythm: Normal rate and regular rhythm.     Comments: pacemaker Pulmonary:     Breath sounds: Wheezing present.     Comments: History respiratory failure Abdominal:     Hernia: A hernia is present.  Skin:    General: Skin is warm and dry.     Findings: Erythema and lesion present.  Neurological:     Mental Status: He is alert and oriented to person, place, and time.  Psychiatric:        Mood and Affect: Mood normal.        Behavior: Behavior normal.     Stoma type/location:  RLQ ileostomy Stomal assessment/size:  7/8" flush stoma Peristomal assessment:  irritation, hernia causes pouching challenges Treatment options for stomal/peristomal skin: barrier ring  1 piece flat  skin tac adhesive Output: liquid tan stool Ostomy pouching: 1pc. flat Education provided:  wound and ostomy care performed.  Continue aquacel and dry gauze to abdominal wounds.  Is short of breath with activity today. Has not been taking one  of this diuretics today as he ran out. Encouraged to go by pharmacy on the way home today.       Impression/dx  Ileostomy Midline surgical wound, nonhealing Discussion  Continue to cleanse abdomen and wounds with  VASHE.  THis has been improving contact dermatitis along with minimizing tape.  Plan  No changes today Stressed importance of medication compliance.     Visit time: 50 minutes.   Maple Hudson FNP-BC

## 2023-05-16 NOTE — Patient Outreach (Signed)
Medicaid Managed Care Social Work Note  05/16/2023 Name:  Luis [name] MRN:  [id number] DOB:  [birth year]  Luis [name] is an 62 y.o. year old male who is a primary patient of [name], NP.  The Medicaid Managed Care Coordination team was consulted for assistance with:  Mental Health Counseling and Resources  Luis [name] was given information about Medicaid Managed Care Coordination team services today. Luis [name] Patient agreed to services and verbal consent obtained.  Engaged with patient  for by telephone forfollow up visit in response to referral for case management and/or care coordination services.   Assessments/Interventions:  Review of past medical history, allergies, medications, health status, including review of consultants reports, laboratory and other test data, was performed as part of comprehensive evaluation and provision of chronic care management services.  SDOH: (Social Determinant of Health) assessments and interventions performed: SDOH Interventions    Flowsheet Row Patient Outreach Telephone from 04/18/2023 in [place] POPULATION HEALTH DEPARTMENT Patient Outreach Telephone from 04/12/2023 in [place] POPULATION HEALTH DEPARTMENT  SDOH Interventions    Food Insecurity Interventions -- Other (Comment)  [BSW referral]  Housing Interventions -- Other (Comment)  [BSW referral]  Transportation Interventions -- Payor Benefit  Utilities Interventions -- Intervention Not Indicated  Depression Interventions/Treatment  --  [Pt will consider therapy] --  Stress Interventions Provide Counseling  [Patient reports that he does have stress but does not feel like he wants to start treatment at this time or make that committment to therapy just yet] --       Advanced Directives Status:  See Care Plan for related entries.  Care Plan                 Allergies  Allergen Reactions   Lisinopril     Other reaction(s): renal effects   Codeine Other (See Comments) and  Hives    unknown Other reaction(s): Other (See Comments) unknown unknown    Medications Reviewed Today   Medications were not reviewed in this encounter     Patient Active Problem List   Diagnosis Date Noted   At risk for falling 03/02/2023   Shortness of breath 03/02/2023   Needs flu shot 03/02/2023   HLD (hyperlipidemia) 02/19/2023   Demand ischemia (HCC) 02/19/2023   COPD with acute exacerbation (HCC) 02/16/2023   Acute exacerbation of CHF (congestive heart failure) (HCC) 02/15/2023   Type 2 diabetes mellitus with diabetic polyneuropathy, without long-term current use of insulin (HCC) 01/20/2023   Chronic combined systolic and diastolic heart failure (HCC) 01/20/2023   Acute on chronic heart failure with preserved ejection fraction (HFpEF) (HCC) 12/23/2022   Encounter for smoking cessation counseling 12/09/2022   Prostate cancer screening 12/09/2022   Alcoholism (HCC) 12/09/2022   Abdominal wall hernia at previous stoma site 12/02/2022   Nonhealing surgical wound, subsequent encounter 11/24/2022   Abdominal hernia without obstruction and without gangrene 11/22/2022   Class 1 obesity with serious comorbidity and body mass index (BMI) of 32.0 to 32.9 in adult 11/04/2022   Insomnia 11/04/2022   Elevated hemoglobin (HCC) 11/04/2022   Smoker 11/04/2022   Irritant contact dermatitis associated with digestive stoma 11/02/2022   Ileostomy prolapse (HCC) 08/18/2022   Ileostomy present (HCC) 08/08/2022   Bilateral inguinal hernia without obstruction or gangrene 08/08/2022   Surgical wound, non healing 04/26/2022   Ileostomy dysfunction (HCC) 04/12/2022   Non-recurrent bilateral inguinal hernia without obstruction or gangrene 03/22/2022   Incisional hernia, without obstruction or gangrene    Nonhealing nonsurgical  wound    Ileostomy stenosis (HCC)    Irritant contact dermatitis associated with fecal stoma    Ileostomy care [facility])    Pacemaker    AV block, 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   Chronic abdominal wound infection, subsequent encounter 12/26/2020   Alcohol dependence with uncomplicated withdrawal (HCC) 12/26/2020   Tobacco dependence 12/26/2020   Essential hypertension 12/26/2020   Intra-abdominal abscess (HCC) 08/03/2019   Type 2 diabetes mellitus with hyperlipidemia (HCC) 08/03/2019   History of DVT (deep vein thrombosis) 08/03/2019    Care Plan : LCSW Plan of Care  Updates made by Gustavus Bryant, LCSW since 05/16/2023 12:00 AM     Problem: Depression Identification (Depression)      Goal: Depressive Symptoms Identified   Start Date: 04/18/2023  Note:   Timeframe:  Short-Range Goal Priority:  High Start Date:   04/18/23             Expected End Date:  ongoing                     Follow Up Date-No follow up date as resources were successfully received in the mail per pt on 05/16/23  - keep 90 percent of scheduled appointments -consider counseling or psychiatry -consider bumping up your self-care  -consider creating a stronger support network   Why is this important?             Combatting depression may take some time.            If you don't feel better right away, don't give up on your treatment plan.    Current barriers:   Chronic Mental Health needs related to symptoms of alcohol abuse, depression, stress and anxiety. Patient requires Support, Education, Resources, Referrals, Advocacy, and Care Coordination, in order to meet Unmet Mental Health Needs and to find a therapist and psychiatrist. Patient will implement clinical interventions discussed today to decrease symptoms of depression and increase knowledge and/or ability of: coping skills. Mental Health Concerns and Social Isolation Chronic physical pain Patient lacks knowledge of available community counseling agencies and resources.  Clinical Goal(s): verbalize understanding of plan for management of Anxiety, Depression, and Stress symptoms and demonstrate a reduction in symptoms.  Patient will connect with a provider for ongoing mental health treatment, increase coping skills, healthy habits, self-management skills, and stress reduction        Clinical Interventions:  Assessed patient's previous and current treatment, coping skills, support system and barriers to care. Patient provided hx. Patient reports receiving substance abuse treatment in the past. Patient admits to ongoing alcohol usage. [facility] LCSW educated patient on healthy coping skills to implement into is daily routine to combat cravings and depressive symptoms once they arise.  Verbalization of feelings encouraged, motivational interviewing employed Emotional support provided, positive coping strategies explored. Establishing healthy boundaries emphasized and healthy self-care education provided Patient was educated on available mental health resources within his area that accept Medicaid and offer counseling and psychiatry. Patient was advised to contact the back of his insurance card for assistance with benefits as well. Patient educated on the difference between therapy and psychiatry per patient request Mailed packet of resources sent to patient today with available mental health resources within his area that accept Medicaid and offer the services that he is interested in. Packet included instructions for scheduling at [facility] as well as some crisis support resources and [facility]'s walk in clinic hours. Patient will review resources over the next 3-4 weeks  and make a decision regarding if/ when and where he wishes to gain MH treatment at. Patient is still unsure if he wishes to pursue any BH treatment at this time but is agreeable to considering.  Emotional support provided. CBT intervention implemented regarding "being mentally fit" by combating negative thinking and replacing it with uplifting support, hope and positivity. Patient reports significant worsening pain impacting his ability to function appropriately and  carry out daily task. Assessed social determinant of health barriers Discussed use of relaxation techniques and/or diversional activities to assist with pain reduction (distraction, imagery, relaxation, massage, acupressure, TENS, heat, and cold application; Reviewed with patient prescribed pharmacological and nonpharmacological pain relief strategies. LCSW provided education on relaxation techniques such as meditation, deep breathing, massage, grounding exercises or yoga that can activate the body's relaxation response and ease symptoms of stress and anxiety. LCSW ask that when pt is struggling with difficult emotions and racing thoughts that they start this relaxation response process. LCSW provided extensive education on healthy coping skills for anxiety. SW used active and reflective listening, validated patient's feelings/concerns, and provided emotional support. Patient will work on implementing appropriate self-care habits into their daily routine such as: staying positive, writing a gratitude list, drinking water, staying active around the house, taking their medications as prescribed, combating negative thoughts or emotions and staying connected with their family and friends. Positive reinforcement provided for this decision to work on this. LCSW provided education on healthy sleep hygiene and what that looks like. LCSW encouraged patient to implement a night time routine into their schedule that works best for them and that they are able to maintain. Advised patient to implement deep breathing/grounding/meditation/self-care exercises into their nightly routine to combat racing thoughts at night. LCSW encouraged patient to wake up at the same time each day, make their sleeping environment comfortable, exercise when able, to limit naps and to not eat or drink anything right before bed.  Motivational Interviewing employed Depression screen reviewed  PHQ2/ PHQ9 completed or reviewed  Mindfulness or  Relaxation training provided Active listening / Reflection utilized  Advance Care and HCPOA education provided Emotional Support Provided Problem Solving /Task Center strategies reviewed Provided psychoeducation for mental health needs  Provided brief CBT  Reviewed mental health medications and discussed importance of compliance:  Quality of sleep assessed & Sleep Hygiene techniques promoted  Participation in counseling encouraged  Verbalization of feelings encouraged  Suicidal Ideation/Homicidal Ideation assessed: Patient denies SI/HI  Review resources, discussed options and provided patient information about  Mental Health Resources Relapse prevention education provided (AA meeting suggested but patient not agreeable even to virtual meetings at this time) Inter-disciplinary care team collaboration (see longitudinal plan of care) Patient reports on 05/16/23 that he successfully received mental health and substance abuse resources in the mail and has considered these resources but denies wishing to pursue any at this time but is open to keeping these resources to revert back to and utilize in the future if he desires. [facility] LCSW will update involved [facility] team members and will sign off at this time.    Patient Goals/Self-Care Activities: Take medications as prescribed   Attend all scheduled provider appointments Call pharmacy for medication refills 3-7 days in advance of running out of medications Perform all self care activities independently  Perform IADL's (shopping, preparing meals, housekeeping, managing finances) independently Call provider office for new concerns or questions Work with the social worker to address care coordination needs and will continue to work with the clinical team to  address health care and disease management related needs call 1-800-273-TALK (toll free, 24 hour hotline) If in a crisis, go to [facility] [address], [place]  [phone]) Utilize healthy coping skills and supportive resources discussed Contact PCP with any questions or concerns Keep 90 percent of counseling appointments Call your insurance provider for more information about your Enhanced Benefits  Check out counseling resources provided  Accept all calls from representative with Behavioral Health Providers in an effort to establish ongoing mental health counseling and supportive services. Incorporate into daily practice - relaxation techniques, deep breathing exercises, and mindfulness meditation strategies. Talk about feelings with friends, family members, spiritual advisor, etc. Contact LCSW directly [phone]), if you have questions, need assistance, or if additional social work needs are identified between now and our next scheduled telephone outreach call. Call 988 for mental health hotline/crisis line if needed (24/7 available) Try techniques to reduce symptoms of anxiety/negative thinking (deep breathing, distraction, positive self talk, etc)  - develop a personal safety plan - develop a plan to deal with triggers like holidays, anniversaries - exercise at least 2 to 3 times per week - have a plan for how to handle bad days - journal feelings and what helps to feel better or worse - spend time or talk with others at least 2 to 3 times per week - watch for early signs of feeling worse - begin personal counseling - call and visit an old friend - check out volunteer opportunities - join a support group - laugh; watch a funny movie or comedian - learn and use visualization or guided imagery - perform a random act of kindness - practice relaxation or meditation daily - start or continue a personal journal - practice positive thinking and self-talk -continue with compliance of taking medication  -identify current effective and ineffective coping strategies.  -implement positive self-talk in care to increase self-esteem, confidence  and feelings of control.  -consider alternative and complementary therapy approaches such as meditation, mindfulness or yoga.  Call your insurance provider to gain education on benefits if desired Call your primary care doctor if symptoms get worse  -journaling, prayer, worship services, meditation or pastoral counseling.  -increase participation in pleasurable group activities such as hobbies, singing, sports or volunteering).  -consider the use of meditative movement therapy such as tai chi, yoga or qigong.  -start a regular daily exercise program based on tolerance, ability and patient choice to support positive thinking and activity    Follow Up Plan:  The patient has been provided with contact information for the care management team and has been advised to call with any mental health or health related questions or concerns.  The care management team will reach out to the patient again over the next 30 business  days.   If you are experiencing a Mental Health or Behavioral Health Crisis or need someone to talk to, please call the Suicide and Crisis Lifeline: 988    Patient Goals: Initial goal  Information about Drinking  High Scores (20+) on the Alcohol Use Identification Test Consider becoming involved in a structured program.  You should stop drinking if: You have tried to cut down before but have not been successful, or  You suffer from morning shakes during a heavy drinking period, or You have high blood pressure, or You are pregnant, or You have liver disease, or You are taking medicines that react with alcohol, or Your alcohol use is affecting your social relationships, or  You have legal consequences like DUIs, or You call in sick to work, or You cannot take care of our children, or Someone close to you says you drink too much    How Much Alcohol is a Drink: Beer: 12 oz. = 1 drink 16 oz. = 1.3 drinks 22 oz. = 2 drinks 40 oz. = 3.3 drinks  Wine: 5 oz. = 1 drink 740  mL (25 oz.) bottle = 5 drinks Malt Liquor: 12 oz. = 1.5 drinks 16 oz. = 2 drinks 22 oz. = 2.5 drinks 40 oz. = 4.5 drinks  80-Proof Spirits - Hard Liquor: 1 shot = 1 drink 1 mixed drink = number of shots Can equal 1-3 drinks   What is Low-risk Drinking? Have no more than 2 drinks of alcohol per day Drink no more than 5 days per week Do not drink alcohol drink alcohol when: You drive or operate machinery You are pregnant or breast feeding You are taking medications that interact with alcohol You have medical conditions made worse with alcohol You can stop or control your drinking      Identify Your Triggers for Drinking Parties Particular People Feeling lonely Feeling tense Family problems Feeling sad Feeling happy Feeling bored After work Problems sleeping Criticism Feelings of failure After being paid When others are drinking In bars When out for dinner After arguments Weekends Feeling restless Being in pain   Effects of High-Risk Drinking To the Brain: Aggressive, irrational behavior Arguments, violence Depression, nervousness Alcohol dependence, memory loss To the Nervous System: Trembling hands, tingling fingers Numbness, painful nerves Impaired sensation leading to falls Numb tingling toes To Your Lifestyle: Social, legal, medical problems Domestic trouble/relationship loss Job loss & financial problems Shortened life span Accidents and death from drunk driving   To the Face: Premature aging, drinker's nose Cancer of the throat & mouth To the Body: Frequent cold Reduced resistance to infection Increased risk of pneumonia Weakness of heart muscle Heart failure, anemia Impaired blood clotting Breast cancer Vitamin deficiency, bleeding Severe Inflammation of the stomach Vomiting, diarrhea, malnutrition Ulcer, inflammation of the pancreas Impaired sexual performance Birth defects, including deformities, retardation, and low birthweight    Ways to Cope Without Drinking Go home if you tend to drink after work Find another activity Switch to nonalcoholic beverages Change friends Join a Lexicographer Visit relatives Plan/take a trip Go for a walk Take up a hobby Listen to music Talk to a friend Reading What would you do if you had no worries about failing?         Good Reasons for Drinking Less I will live longer - probably 8-10 years. I will sleep better. I will be happier. I will save a lot of money My relationships will improve. I will stay younger for longer. I will achieve more in my life There will be a greater chance that I will survive to a healthy old age with no premature damage to my brain.  I will be better at my job. I will be less likely to feel depressed and commit suicide (6 times less likely). I will be less likely to die of heart disease or cancer. Other people will respect me I will be less likely to get into trouble with the police. The possibility that I will die of liver disease will be dramatically reduced (12 times less likely). It will be less likely that I will die in a car accident (3 times less likely).   Strategies for Cutting Down Keep  Track.  Find a way to keep track of how much you drink.  If you make a note of each drink before you drink it, this will help slow you down. Count and Measure.  Know the standard drink sizes.  Ask the bartender or server about the amount of alcohol in a mixed drink. Set Goals.  Decide how many days a week you will drink and how many drinks each day. Pace and Space.  When you do drink, pace yourself.  Have no more than one drink with alcohol per hour.  Alternate "drink spacers" non-alcoholic drinks such as water, soda, or juice with drinks containing alcohol. Include Food.  Don't drink on an empty stomach.  Have some food so the alcohol will be absorbed more slowly into your system.  Avoid Triggers.  Avoid people, places, or activities that have  led to drinking in the past.  Certain times of day or feelings may also be triggers.  Make a plan so you will know what you can do instead of drinking. Plan to Handle Urges.  When an urge hits, consider these options:  Remind yourself of your reasons for changing.  Or talk it through with someone you trust. Or get involved with a healthy, distracting activity.  Or, "urge surf" - instead of fighting the feeling, accept it and ride it out, knowing it will soon crest like a wave and pass. Know Your "No".  Have a polite, convincing "no thanks" for those times when you may be offered a drink and don't want one.  The faster you can say no to these offers, the less likely you are to give in.  If you hesitate, it allows you time to think of excuses to go along.              Follow up:  Patient agrees to Care Plan and Follow-up.  Plan: The Managed Medicaid care management team will reach out to the patient again over the next 30 days.  Date/time of next scheduled care management/care coordination outreach:  05/22/23 at 3 pm.   Dickie La, BSW, MSW, LCSW Licensed Clinical Social Worker [email or web address]   [email or web address] Direct Dial: [phone]

## 2023-05-20 NOTE — Discharge Instructions (Signed)
No changes Refill all medications and take as directed

## 2023-05-22 ENCOUNTER — Other Ambulatory Visit: Payer: Self-pay

## 2023-05-22 ENCOUNTER — Other Ambulatory Visit (HOSPITAL_COMMUNITY): Payer: Self-pay

## 2023-05-22 NOTE — Patient Outreach (Signed)
Medicaid Managed Care Social Work Note  05/22/2023 Name:  Luis [name] MRN:  [id number] DOB:  19-Mar-1961  Luis [name] is an 61 y.o. year old male who is a primary patient of Luis Paddy, NP.  The [facility] Managed Care Coordination team was consulted for assistance with:  Community Resources   Luis [name] was given information about Medicaid Managed Care Coordination team services today. Luis [name] Patient agreed to services and verbal consent obtained.  Engaged with patient  for by telephone forfollow up visit in response to referral for case management and/or care coordination services.   Assessments/Interventions:  Review of past medical history, allergies, medications, health status, including review of consultants reports, laboratory and other test data, was performed as part of comprehensive evaluation and provision of chronic care management services.  SDOH: (Social Determinant of Health) assessments and interventions performed: SDOH Interventions    Flowsheet Row Patient Outreach Telephone from 04/18/2023 in [place] POPULATION HEALTH DEPARTMENT Patient Outreach Telephone from 04/12/2023 in [place] POPULATION HEALTH DEPARTMENT  SDOH Interventions    Food Insecurity Interventions -- Other (Comment)  [BSW referral]  Housing Interventions -- Other (Comment)  [BSW referral]  Transportation Interventions -- Payor Benefit  Utilities Interventions -- Intervention Not Indicated  Depression Interventions/Treatment  --  [Pt will consider therapy] --  Stress Interventions Provide Counseling  [Patient reports that he does have stress but does not feel like he wants to start treatment at this time or make that committment to therapy just yet] --     BSW completed a telephone outreach with patient, he stated he did receive the resources BSW sent him and he glanced at it. Patient states he is going to loose his townhome because he cannot pay his HOA and utilities. Patient states he  does not have a cut off notice and needs to contact Social Security, but the wait time is long. No other resources are needed at this time.   Advanced Directives Status:  Not addressed in this encounter.  Care Plan                 Allergies  Allergen Reactions   Lisinopril     Other reaction(s): renal effects   Codeine Other (See Comments) and Hives    unknown Other reaction(s): Other (See Comments) unknown unknown    Medications Reviewed Today   Medications were not reviewed in this encounter     Patient Active Problem List   Diagnosis Date Noted   At risk for falling 03/02/2023   Shortness of breath 03/02/2023   Needs flu shot 03/02/2023   HLD (hyperlipidemia) 02/19/2023   Demand ischemia (HCC) 02/19/2023   COPD with acute exacerbation (HCC) 02/16/2023   Acute exacerbation of CHF (congestive heart failure) (HCC) 02/15/2023   Type 2 diabetes mellitus with diabetic polyneuropathy, without long-term current use of insulin (HCC) 01/20/2023   Chronic combined systolic and diastolic heart failure (HCC) 01/20/2023   Acute on chronic heart failure with preserved ejection fraction (HFpEF) (HCC) 12/23/2022   Encounter for smoking cessation counseling 12/09/2022   Prostate cancer screening 12/09/2022   Alcoholism (HCC) 12/09/2022   Abdominal wall hernia at previous stoma site 12/02/2022   Nonhealing surgical wound, subsequent encounter 11/24/2022   Abdominal hernia without obstruction and without gangrene 11/22/2022   Class 1 obesity with serious comorbidity and body mass index (BMI) of 32.0 to 32.9 in adult 11/04/2022   Insomnia 11/04/2022   Elevated hemoglobin (HCC) 11/04/2022   Smoker 11/04/2022   Irritant  contact dermatitis associated with digestive stoma 11/02/2022   Ileostomy prolapse (HCC) 08/18/2022   Ileostomy present (HCC) 08/08/2022   Bilateral inguinal hernia without obstruction or gangrene 08/08/2022   Surgical wound, non healing 04/26/2022   Ileostomy dysfunction  (HCC) 04/12/2022   Non-recurrent bilateral inguinal hernia without obstruction or gangrene 03/22/2022   Incisional hernia, without obstruction or gangrene    Nonhealing nonsurgical wound    Ileostomy stenosis (HCC)    Irritant contact dermatitis associated with fecal stoma    Ileostomy care [facility])    Pacemaker    AV block, 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   Follow up:  Patient agrees to Care Plan and Follow-up.  Plan: The  Patient has been provided with contact information for the Managed Medicaid care management team and has been advised to call with any health related questions or concerns.    Abelino Derrick, MHA [facility]  Managed [facility] Social Worker [phone]

## 2023-05-22 NOTE — Patient Instructions (Signed)
Visit Information  Luis [name] was given information about Medicaid Managed Care team care coordination services as a part of their [facility] Medicaid benefit. Luis [name] verbally consented to engagement with the [facility] Managed Care team.   If you are experiencing a medical emergency, please call 911 or report to your local emergency department or urgent care.   If you have a non-emergency medical problem during routine business hours, please contact your provider's office and ask to speak with a nurse.   For questions related to your [facility] health plan, please call: [phone] or go here:[email or web address]  If you would like to schedule transportation through your [facility] plan, please call the following number at least 2 days in advance of your appointment: [phone].   You can also use the MTM portal or MTM mobile app to manage your rides. Reimbursement for transportation is available through [facility]! For the portal, please go to [email or web address]  Call the [facility] Crisis Line at [phone], at any time, 24 hours a day, 7 days a week. If you are in danger or need immediate medical attention call 911.  If you would like help to quit smoking, call 1-800-QUIT-NOW (781-680-8763) OR Espaol: 1-855-Djelo-Ya (1-027-253-6644) o para ms informacin haga clic aqu or Text READY to 034-742 to register via text  Mr. Kollman - following are the goals we discussed in your visit today:   Goals Addressed   None      The  Patient                                              has been provided with contact information for the Managed Medicaid care management team and has been advised to call with any health related questions or concerns.   Luis [name], Luis [name], MHA [facility]  Managed Medicaid Social Worker [phone]   Following is a copy of your plan of care:  There are no care plans that you recently modified to display for this  patient.

## 2023-05-23 ENCOUNTER — Ambulatory Visit (HOSPITAL_COMMUNITY)
Admission: RE | Admit: 2023-05-23 | Discharge: 2023-05-23 | Disposition: A | Discharge status: Home / Self Care | Payer: Medicaid Other | Source: Ambulatory Visit | Attending: *Deleted | Admitting: *Deleted

## 2023-05-23 DIAGNOSIS — T8189XA Other complications of procedures, not elsewhere classified, initial encounter: Secondary | ICD-10-CM | POA: Diagnosis not present

## 2023-05-23 DIAGNOSIS — K469 Unspecified abdominal hernia without obstruction or gangrene: Secondary | ICD-10-CM | POA: Diagnosis not present

## 2023-05-23 DIAGNOSIS — Z7689 Persons encountering health services in other specified circumstances: Secondary | ICD-10-CM | POA: Diagnosis not present

## 2023-05-25 NOTE — Progress Notes (Signed)
Cancelling order with [facility] patient is now medicaid and needs a different provider  Order and demo sent to [facility] for DM shoes  Nicki Guadalajara

## 2023-05-26 NOTE — Progress Notes (Unsigned)
Cardiology Office Note Date:  05/26/2023  Patient ID:  Luis [name], DOB 03-01-1961, MRN [id number] PCP:  [name], NP  Electrophysiologist: Dr. Lalla Brothers Pulmonaology: Dr. Judeth Horn    Chief Complaint:  f/u visit  History of Present Illness: Luis [name] is a 62 y.o. male with history of HTN, HLD, diverticulitis (w/perforation s/p colectomy and ileostomy complicated by wound infections for the past 2 years), CHB w/PPM, HFpEF, COPD, DVT/PE (post-op 2021 or so)  He was hospitalized 12/2020 with SOB found in CHB, unfortunately he had been struggling with his abdominal wounds for 2 years. Reported Changes his wounds at home but remained with ongoing drainage of foul smelling purulence, frequent fevers/chills.  Echo 12/26/2020 LVEF 65-70% cMRI 12/28/2020 i.  Normal LV size with mild LV hypertrophy, EF 72%. ii.  Normal RV size with EF 55%.  iii.  Normal ECV percentage is not suggestive of cardiac amyloidosis. iv. LGE noted in the mid-wall basal anteroseptum and mid-wall basal inferolateral wall. This is not a coronary disease pattern, could be consistent with cardiac sarcoidosis in the appropriate clinical situation, also prior myocarditis. Would suggest high resolution CT chest to look for any evidence for pulmonary sarcoidosis. 3. High Res Chest CT 12/29/2020 without evidence of ILD (See result note for full results) He needed pacing support prior to any intervention for his abdominal wound and ultimately underwent Micra AV implant There was discussion though that with sarcoid, there may be need for ICD in the future Recommended out patient PET Discharged 01/01/21  He saw [name], PA-C 03/01/21, was feeling better, reported progress in his wound though not healed, following at [facility], not planned for surgery. He had some ectopy that could have been preventing optimal AS-VP. His Toprol increased.  Troubleshooting was done with industry support  I saw him Dec 2022 The patient tells me  that since the pacemaker his breathing immediately was better and remains improved Past that he feels awful. When asked if he has had any CP or concerns/symptoms he thinks may be his heart he gets very defensive even , say "look at me, I feel terrible, wouldn't you?!" He reports that he has been flayed open and ever since has never felt well again He denies syncope, says a month ago he fell and did not have the strength to  get up, he doesn't know whay Eventually got himself up.  Symptoms/concerns are very difficult to weed out today, he reports he feels terrible, always does and is not new.  He is very emotional about the ongoing wound and inability to heal.  He reports that he has had to cancel a number of appointments with the surgeon as he has ours because he just doesn't feel well and cancels. He has an appt with Dr. [name] (he thinks is the name) surgery next month.He is not actively getting formal wound care, he changes the dressings when needed.  Says his son is very supportive and helpful but live a ways away and is hard for him to be there all of the time He admits to smoking and drinking heavily.  When asked how much he drinks he saw "a lot", is not more specific.  He is not interested in quitting either currently. Pacer functioning appropriately 80% AM/VP EKG was abnormal with marked T changes, unclear symptoms, and discussed with DOD, planned for stress test and labs/lytes He was urged to reduce at least amount he was drinking/smoking, quitting was not on the table and re-establish with his wound  care doctors  He did not get the stress test done  He called 09/30/21, sent a remote with symptoms of waking with the feeling of suffocating his assessment perhaps allergies or anxiety, device transmission noted intact device function, device RN urged he keep his follow up as scheduled with Dr. Lalla Brothers.  ER visit 10/02/21 with bleeding abdominal wound, apparently started after he pulled off  the scab, notes report strong odor of alcohol by triage provider, ultimately left without formal evaluation  He canceled his visit with Dr. Lalla Brothers in May, and no showed to the next. He has cancelled and no showed to a number of scheduled visits with the wound/ostomy clinic as well.  He saw the wound clinic 11/23/21  I saw him 11/19/21 He reports last week on his way here suddenly started to feel poorly, chills, felt ill. Spent the day in bed. The next day he developed blood in his urine thinks he passed a kidney stone and then felt better and has since then. No CP, some SOB that is chronic No fainting or near fainting Of course belly never feels well Seeing wound clinic, says the person he saw last was wonderful and is encouraged that she will get him where he needs to be in hopes of a colostomy reversal Travel to [facility] for PET, MRIs is not an option for him Planned to revisit imaging once [facility] program was up/running Urged to minimize/quit ETOH and smoking  He saw Dr Lalla Brothers 05/25/22, not sleep well, had gained 40lbs, planned for an echo, suspected his fatigue multifactorial   Hospitalized 12/23/22-01/01/23 PNA, acute/chronic CHF (diastolic) Patient was managed with antibiotics, BiPAP due to increasing oxygen requirements, eventually intubated and transferred to ICU on 12/25/2018 for further management of respiratory failure. Hospital stay was also complicated by acute gout that was managed with colchicine and prednisone  Delirium/ETOH withdrawal Wound managed with ostomy/wound team  Hospitalized 02/15/23 - 02/20/23 Progressive SOB, acute CHF COPD exacerbation required up to 15 L high flow oxygen during hospital stay. He diuresed well on IV Lasix >> home back on his torsemide Prednisone taper Wound care followed   I saw him 02/23/23 He feels much better in comparison to pre-hospital, very concerned about the fluid build up and HF diagnosis. He reports that he feels good enough that he would  like to get to walking, or some exercise No CP, palpitations Has some SOB, none at rest, minimal at exertional Intermittent cough No near syncope or syncope He reports a very good appetite, no real dietary restrictions Discussed sodium, he will give it some work Drinks quite a bit of water and soda, mouth feels dry, discussed restriction of fluids and avoiding soda to 2L Discussed daily weights, he is doing this, has been steady 230 at his home scale since home Planned to refer to HF team, given a 3 mo back up visit with me until established with the HF team.  He saw Dr. Gasper Lloyd 03/29/23, doing OK, home ADLS, not much else as far as activity went. Toprol dose reduced (with plans to stop in the future) Volume OL and torsemide pulsed Low suspicion of sarcoid Urged to quit alcohol I don't see plans for f/u  TODAY Generally "OK" SOB waxes/wanes, he was not convinced the extra diuretic doses helped much if at all, reports he urinates well and frequently. No CP, palpitations He c/w wound care, he remarks she has been a life saver for him! Reports that with dressing changes his skin bleeds quite a  bit, has to hold pressure for several minutes, but has not had to call EMS for bleeding in several months, but has had severe bleeding from his wounds in the past. He denies syncope, near syncope No falls of late but did have a period of time that he was falling quite a bit     Device information Micra AV PPM implanted MDT 12/31/2020   Past Medical History:  Diagnosis Date   Acute respiratory failure with hypoxia (HCC) 12/23/2022   Alcohol withdrawal syndrome, with delirium (HCC) 12/27/2022   Diabetes mellitus without complication (HCC)    Diverticulitis    DVT (deep venous thrombosis) (HCC)    x3   Encephalopathy acute 12/28/2022   ETOH abuse    H/O colectomy    Hyperlipidemia    Hypertension    Sepsis associated hypotension (HCC) 08/03/2019   Smoker     Past Surgical History:   Procedure Laterality Date   COLOSTOMY     PACEMAKER LEADLESS INSERTION N/A 12/31/2020   Procedure: PACEMAKER LEADLESS INSERTION;  Surgeon: [name], MD;  Location: [facility] INVASIVE CV LAB;  Service: Cardiovascular;  Laterality: N/A;   TONSILLECTOMY      Current Outpatient Medications  Medication Sig Dispense Refill   acetaminophen (ACETAMINOPHEN 8 HOUR) 650 MG CR tablet Take 1 tablet (650 mg total) by mouth every 8 (eight) hours as needed for pain 90 tablet 1   acetaminophen (TYLENOL) 650 MG CR tablet Take 1,300 mg by mouth every 8 (eight) hours as needed for pain.     amLODipine (NORVASC) 10 MG tablet Take 1 tablet (10 mg total) by mouth daily. 90 tablet 0   ascorbic acid (VITAMIN C) 500 MG tablet Take 1 tablet (500 mg total) by mouth daily. 30 tablet 1   atorvastatin (LIPITOR) 10 MG tablet Take 1 tablet (10 mg total) by mouth daily. 90 tablet 0   buPROPion (WELLBUTRIN SR) 150 MG 12 hr tablet Take 1 tablet (150 mg total) by mouth 2 (two) times daily in the morning and evening. (Patient not taking: Reported on 04/12/2023) 60 tablet 2   cetirizine (ZYRTEC) 10 MG tablet Take 1 tablet (10 mg total) by mouth daily. 90 tablet 0   dapagliflozin propanediol (FARXIGA) 10 MG TABS tablet Take 1 tablet (10 mg total) by mouth daily before breakfast. 90 tablet 2   fluticasone (FLOVENT HFA) 110 MCG/ACT inhaler Inhale 2 puffs into the lungs 2 (two) times daily. 1 each 2   folic acid (FOLVITE) 1 MG tablet Take 1 tablet (1 mg total) by mouth daily. 90 tablet 0   gabapentin (NEURONTIN) 300 MG capsule Take 1 capsule (300 mg total) by mouth at bedtime. 90 capsule 3   hydrOXYzine (VISTARIL) 50 MG capsule Take 1 capsule (50 mg total) by mouth at bedtime as needed. 30 capsule 2   insulin glargine-yfgn (SEMGLEE) 100 UNIT/ML injection Inject 0.2 mLs (20 Units total) into the skin 2 (two) times daily. (Patient not taking: Reported on 04/12/2023) 10 mL 11   ipratropium-albuterol (DUONEB) 0.5-2.5 (3) MG/3ML SOLN  Take 3 mLs by nebulization every 6 (six) hours as needed. 360 mL 0   losartan (COZAAR) 25 MG tablet Take 1 tablet (25 mg total) by mouth daily. 30 tablet 5   metFORMIN (GLUCOPHAGE) 500 MG tablet Take 2 tablets (1,000 mg total) by mouth daily with breakfast AND 1 tablet (500 mg total) daily with supper. 90 tablet 3   metoprolol succinate (TOPROL-XL) 25 MG 24 hr tablet Take 1 tablet (25  mg total) by mouth daily. 90 tablet 3   Multiple Vitamin (MULTIVITAMIN WITH MINERALS) TABS tablet Take 1 tablet by mouth every other day.      omega-3 acid ethyl esters (LOVAZA) 1 g capsule Take 1 capsule (1 g total) by mouth daily. 90 capsule 3   thiamine (VITAMIN B1) 100 MG tablet Take 1 tablet (100 mg total) by mouth daily. 90 tablet 0   torsemide (DEMADEX) 20 MG tablet Take 1 tablet (20 mg total) by mouth daily. 30 tablet 1   torsemide (DEMADEX) 20 MG tablet Take 1 tablet (20 mg total) by mouth daily. 30 tablet 2   triamcinolone cream (KENALOG) 0.5 % Apply topically to legs twice daily for up to 2 weeks as needed for eczema (Patient not taking: Reported on 04/12/2023) 30 g 1   triamcinolone ointment (KENALOG) 0.1 % Apply 1 Application topically 2 (two) times daily. To affected area on abdomen 75 g 0   No current facility-administered medications for this visit.    Allergies:   Lisinopril and Codeine   Social History:  The patient  reports that he has been smoking cigarettes. He has a 44 pack-year smoking history. He uses smokeless tobacco. He reports current alcohol use. He reports current drug use. Drug: Marijuana.   Family History:  The patient's family history includes COPD in his mother; Prostate cancer in his father.  ROS:  Please see the history of present illness.    All other systems are reviewed and otherwise negative.   PHYSICAL EXAM:  VS:  There were no vitals taken for this visit. BMI: There is no height or weight on file to calculate BMI. Well nourished, well developed, in no acute distress,  chronically ill appearing HEENT: normocephalic, atraumatic Neck: no JVD, carotid bruits or masses Cardiac:  RRR; no significant murmurs, no rubs, or gallops Lungs: CTA b/l today, no crackles, not wheezing Abd: ostomy R abdomen,large dressing in place, not removed, scaly/erythematous skin changes diffusely around dressing, not otherwise examined MS: no deformity or  atrophy Ext: no edema Skin: warm and dry, no rash Neuro:  No gross deficits appreciated Psych: euthymic mood, full affect   EKG:  done today and reviewed by myself AFlutter/V paced 100bpm    Device interrogation done today by industry and reviewed by myself:  Battery is 7 years years Electrode measurements are good Reported 98 % A tracking Remains device dependent today Underlying is AFlutter  12/24/22: TTE 1. Left ventricular ejection fraction, by estimation, is 50 to 55%. The  left ventricle has low normal function. The left ventricle has no regional  wall motion abnormalities. Left ventricular diastolic parameters are  consistent with Grade II diastolic  dysfunction (pseudonormalization).   2. Right ventricular systolic function is normal. The right ventricular  size is normal. Tricuspid regurgitation signal is inadequate for assessing  PA pressure.   3. Left atrial size was mildly dilated.   4. The mitral valve is abnormal. Trivial mitral valve regurgitation. No  evidence of mitral stenosis. Severe mitral annular calcification.   5. The aortic valve was not well visualized. There is moderate  calcification of the aortic valve. Aortic valve regurgitation is not  visualized. No aortic stenosis is present.   6. Aortic dilatation noted. There is borderline dilatation of the  ascending aorta, measuring 39 mm.   Comparison(s): No significant change from prior study.     Recent Labs: 02/15/2023: ALT 33; B Natriuretic Peptide 136.7 02/16/2023: Magnesium 1.7; TSH 1.632 02/20/2023: Hemoglobin 13.6; Platelets  213 03/20/2023: BUN 30; Creatinine, Ser 1.35; Potassium 4.3; Sodium 136  11/04/2022: Cholesterol 127; Direct LDL 51.0; HDL 32.50; Total CHOL/HDL Ratio 4; VLDL 74.2 12/26/2022: Triglycerides 522   CrCl cannot be calculated (Patient's most recent lab result is older than the maximum 21 days allowed.).   Wt Readings from Last 3 Encounters:  04/26/23 235 lb (106.6 kg)  03/29/23 236 lb 3.2 oz (107.1 kg)  03/06/23 228 lb 3.2 oz (103.5 kg)     Other studies reviewed: Additional studies/records reviewed today include: summarized above  ASSESSMENT AND PLAN:  PPM Intact function No programming changes made Dependent 100% RV pacing  HTN Elevated today A recheck is 146/88, he reports better at home  4. Chronic CHF/HFpEF Recurrent hospitalizations, exacerbations Saw Dr. Sofie Hartigan, low suspicion of sarcoid, regardless would not be a candidate for immunosuppressive tx with his large abd wound, excessive ETOH use/compliance concerns I don't see any recs for follow up there He does not appear to be volume OL today   5. AFlutter rate controlled V paced  In review, looks at least since September Discussed at length with the patient today, AFlutter and indication/rational for anticoagulation He is weary of the idea of being on a blood thinner He also tells me that in the last year he had to call EMS on a number of occasions to help get bleeding stopped from his abdominal/skin wounds, that was terrifying for him, "I have nearly bled to death you know" He has hx of falls, but has not fallen in a while, he thinks was because of his medicines. He continues to drink, says some days less then a pint of liquor, some days more then a pint He has had clinical withdrawal during a hospitalization He reports that he bleeds with dressing changes Reached out to his wound care NP, confirmed does have skin bleeding with dressing changes that has been managed with silver nitrate sticks, abdominal skin described  as very thin, bleeds easily, she felt he could be educated on use of silver nitrate sticks to manage bleeding. The patient though was not convinced, says he has a very hard time (can not) seeing the inferior aspect of his abdomen   Discussed with one of our clinical cardiologists in the office today the case, who felt anticoagulation in this patient would be high risk.  With the patient's reluctance as well, OAC not started today, planned to have follow up in 3-4 weeks I think that his risk of complication with anticoagulation is high and ultimately prohibitive.  After the patient left today I had opportunity to review with Dr. Lalla Brothers who was familiar with the patient, he also felt he was high risk for anticoagulation and not a candidate, would not proceed.  Disposition: back in 3-4 weeks, sooner if needed.     Current medicines are reviewed at length with the patient today.  The patient did not have any concerns regarding medicines.  Norma Fredrickson, PA-C 05/26/2023 2:18 PM     [facility] [address] [place] [address] [phone] (office)  [phone] (fax)

## 2023-05-29 ENCOUNTER — Encounter: Payer: Self-pay | Admitting: Physician Assistant

## 2023-05-29 ENCOUNTER — Ambulatory Visit: Payer: Medicaid Other | Attending: Physician Assistant | Admitting: Physician Assistant

## 2023-05-29 VITALS — BP 161/60 | HR 92 | Ht 73.0 in | Wt 240.6 lb

## 2023-05-29 DIAGNOSIS — Z950 Presence of cardiac pacemaker: Secondary | ICD-10-CM | POA: Diagnosis not present

## 2023-05-29 DIAGNOSIS — Z7689 Persons encountering health services in other specified circumstances: Secondary | ICD-10-CM | POA: Diagnosis not present

## 2023-05-29 DIAGNOSIS — I442 Atrioventricular block, complete: Secondary | ICD-10-CM

## 2023-05-29 DIAGNOSIS — I5032 Chronic diastolic (congestive) heart failure: Secondary | ICD-10-CM | POA: Diagnosis not present

## 2023-05-29 DIAGNOSIS — I4892 Unspecified atrial flutter: Secondary | ICD-10-CM | POA: Diagnosis not present

## 2023-05-29 NOTE — Patient Instructions (Signed)
Medication Instructions:   Your physician recommends that you continue on your current medications as directed. Please refer to the Current Medication list given to you today.  *If you need a refill on your cardiac medications before your next appointment, please call your pharmacy*   Lab Work:  NONE ORDERED  TODAY    If you have labs (blood work) drawn today and your tests are completely normal, you will receive your results only by: MyChart Message (if you have MyChart) OR A paper copy in the mail If you have any lab test that is abnormal or we need to change your treatment, we will call you to review the results.   Testing/Procedures:   NONE ORDERED  TODAY     Follow-Up:    At [facility], you and your health needs are our priority.  As part of our continuing mission to provide you with exceptional heart care, we have created designated Provider Care Teams.  These Care Teams include your primary Cardiologist (physician) and Advanced Practice Providers (APPs -  Physician Assistants and Nurse Practitioners) who all work together to provide you with the care you need, when you need it.  We recommend signing up for the patient portal called "MyChart".  Sign up information is provided on this After Visit Summary.  MyChart is used to connect with patients for Virtual Visits (Telemedicine).  Patients are able to view lab/test results, encounter notes, upcoming appointments, etc.  Non-urgent messages can be sent to your provider as well.   To learn more about what you can do with MyChart, go to [email or web address].    Your next appointment:    3 -4 week(s) ( CONTACT  CASSIE HALL/ ANGELINE HAMMER FOR EP SCHEDULING ISSUES )   Provider:    You may see   Francis Dowse, PA-C  Other Instructions

## 2023-05-30 ENCOUNTER — Ambulatory Visit (HOSPITAL_COMMUNITY)
Admission: RE | Admit: 2023-05-30 | Discharge: 2023-05-30 | Disposition: A | Discharge status: Home / Self Care | Payer: Medicaid Other | Source: Ambulatory Visit | Attending: Plastic Surgery | Admitting: Plastic Surgery

## 2023-05-30 ENCOUNTER — Telehealth: Payer: Self-pay | Admitting: *Deleted

## 2023-05-30 ENCOUNTER — Telehealth: Payer: Self-pay | Admitting: Physician Assistant

## 2023-05-30 DIAGNOSIS — T8189XD Other complications of procedures, not elsewhere classified, subsequent encounter: Secondary | ICD-10-CM | POA: Diagnosis not present

## 2023-05-30 DIAGNOSIS — K469 Unspecified abdominal hernia without obstruction or gangrene: Secondary | ICD-10-CM | POA: Diagnosis not present

## 2023-05-30 DIAGNOSIS — L24B3 Irritant contact dermatitis related to fecal or urinary stoma or fistula: Secondary | ICD-10-CM | POA: Diagnosis not present

## 2023-05-30 DIAGNOSIS — K4090 Unilateral inguinal hernia, without obstruction or gangrene, not specified as recurrent: Secondary | ICD-10-CM

## 2023-05-30 DIAGNOSIS — Z432 Encounter for attention to ileostomy: Secondary | ICD-10-CM | POA: Insufficient documentation

## 2023-05-30 DIAGNOSIS — L2489 Irritant contact dermatitis due to other agents: Secondary | ICD-10-CM | POA: Insufficient documentation

## 2023-05-30 DIAGNOSIS — Z7689 Persons encountering health services in other specified circumstances: Secondary | ICD-10-CM | POA: Diagnosis not present

## 2023-05-30 NOTE — Progress Notes (Signed)
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 Acute respiratory failure with hypoxia (HCC) 12/23/2022  . Alcohol withdrawal syndrome, with delirium (HCC) 12/27/2022  . Diabetes mellitus without complication (HCC)   . Diverticulitis   . DVT (deep venous thrombosis) (HCC)    x3  . Encephalopathy acute 12/28/2022  . ETOH abuse   . H/O colectomy   . Hyperlipidemia   . Hypertension   . Sepsis associated hypotension (HCC) 08/03/2019  . Smoker    Family History  Problem Relation Age of Onset  . COPD Mother   . Prostate cancer Father   . Colon cancer Neg Hx   . Rectal cancer Neg Hx   . Stomach cancer Neg Hx   . Esophageal cancer Neg Hx    Allergies  Allergen Reactions  . Lisinopril     Other reaction(s): renal effects  . Codeine Other (See Comments) and Hives    unknown Other reaction(s): Other (See Comments) unknown unknown   Current Outpatient Medications  Medication Sig Dispense Refill Last Dose/Taking  . acetaminophen (ACETAMINOPHEN 8 HOUR) 650 MG CR tablet Take 1 tablet (650 mg total) by mouth every 8 (eight) hours as needed for pain 90 tablet 1   . amLODipine (NORVASC) 10 MG tablet Take 1 tablet (10 mg total) by mouth daily. 90 tablet 0   . ascorbic acid (VITAMIN C) 500 MG tablet Take 1 tablet (500 mg total) by mouth daily. 30 tablet 1   . atorvastatin (LIPITOR) 10 MG tablet Take 1 tablet (10 mg total) by mouth daily. 90 tablet 0   . cetirizine (ZYRTEC) 10 MG tablet Take 1 tablet (10 mg total) by mouth daily. 90 tablet 0   . dapagliflozin propanediol (FARXIGA) 10 MG TABS tablet Take 1 tablet (10 mg total) by mouth daily before breakfast. 90 tablet 2   . fluticasone (FLOVENT HFA) 110 MCG/ACT inhaler Inhale 2 puffs into the lungs 2 (two) times daily. 1 each 2   . folic acid (FOLVITE) 1 MG tablet Take  1 tablet (1 mg total) by mouth daily. 90 tablet 0   . gabapentin (NEURONTIN) 300 MG capsule Take 1 capsule (300 mg total) by mouth at bedtime. 90 capsule 3   . hydrOXYzine (VISTARIL) 50 MG capsule Take 1 capsule (50 mg total) by mouth at bedtime as needed. 30 capsule 2   . losartan (COZAAR) 25 MG tablet Take 1 tablet (25 mg total) by mouth daily. 30 tablet 5   . metFORMIN (GLUCOPHAGE) 500 MG tablet Take 2 tablets (1,000 mg total) by mouth daily with breakfast AND 1 tablet (500 mg total) daily with supper. 90 tablet 3   . metoprolol succinate (TOPROL-XL) 25 MG 24 hr tablet Take 1 tablet (25 mg total) by mouth daily. 90 tablet 3   . Multiple Vitamin (MULTIVITAMIN WITH MINERALS) TABS tablet Take 1 tablet by mouth every other day.      . omega-3 acid ethyl esters (LOVAZA) 1 g capsule Take 1 capsule (1 g total) by mouth daily. 90 capsule 3   . thiamine (VITAMIN B1) 100 MG tablet Take 1 tablet (100 mg total) by mouth daily. 90 tablet 0   . torsemide (DEMADEX) 20 MG tablet Take 1 tablet (20 mg total) by mouth daily. 30 tablet 1   . triamcinolone cream (KENALOG) 0.5 % Apply topically to legs  twice daily for up to 2 weeks as needed for eczema 30 g 1   . triamcinolone ointment (KENALOG) 0.1 % Apply 1 Application topically 2 (two) times daily. To affected area on abdomen 75 g 0    No current facility-administered medications for this encounter.   ROS  Review of Systems  Constitutional:  Positive for fatigue.  HENT:  Positive for congestion.   Gastrointestinal:  Positive for abdominal pain.       RLQ ileostomy  Abdominal hernia Nonhealing midline surgical wound  Musculoskeletal:  Positive for gait problem.       Uses wheelchair in hospital   Skin:  Positive for color change, rash and wound.       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  Has improved with VASHE cleansing and minimal tape usage.   All other systems reviewed and are  negative.  Vital signs:  BP (!) 147/98 (BP Location: Right Arm)   Pulse 93   Temp 97.6 F (36.4 C) (Oral)   Resp 20   SpO2 92%  Exam:  Physical Exam Vitals reviewed.  Constitutional:      Appearance: He is obese.  Cardiovascular:     Rate and Rhythm: Normal rate and regular rhythm.     Comments: pacemaker Pulmonary:     Breath sounds: Wheezing (smoker) present.  Abdominal:     Hernia: A hernia is present.  Musculoskeletal:     Comments: Weakness with ambulation  Skin:    Findings: Erythema, lesion (nonhealing abdominal wound) and rash present.  Neurological:     Mental Status: He is alert.  Psychiatric:        Mood and Affect: Mood normal.        Behavior: Behavior normal.    Stoma type/location:  RLQ ileostomy Stomal assessment/size:  1" recessed pink moist productive of liquid brown stool Peristomal assessment:  erythema and creasing Treatment options for stomal/peristomal skin: barrier ring and 1 piece flat pouch Output: liquid brown stool Ostomy pouching: 1pc. flat  Education provided:  Wound care to abdominal wound. COntinue aquacel over nonintact areas.  Moderate to heavy drainage at times.  Skin is thin and friable due to stretching from hernia girth.  He has been using steroid cream given to him at times on the reddened skin.  I remind him the steroids will thin the skin further and I would try minimizing adhesive and using VASHE and leaving open to air as this does help.  He forgets to do this at home sometimes, he states.     Impression/dx  Contact dermatitis Medical adhesive related skin injury Abdominal hernia Nonhealing wound ileostomy Discussion  See above Continue pouching as usual Wound care with aquacel to open areas  VASHE to periwound irritation Plan  See back 2 weeks to monitor wound care, ostomy care Counseled patient on alcohol and smoking cessation if he desires to move forward with hernia repair surgery and ostomy reversal.     Visit  time: 55 minutes.   Mike Gip FNP-BC

## 2023-05-30 NOTE — Telephone Encounter (Signed)
Called to discuss anticoagulation discussion with Dr. Lalla Brothers and decision/recommendation not to pursue anticoagulation. Felt his overall risk of complication with anticoagulation is prohibitive. Unable to leave a message at either number My MA earlier today was able to leave a message for him  Luis [name], [place]

## 2023-05-30 NOTE — Telephone Encounter (Signed)
Attempted to reach patient on home no answer but left message and attempted cell not able to leave a message at this time will retry again.

## 2023-06-01 ENCOUNTER — Other Ambulatory Visit (HOSPITAL_COMMUNITY): Payer: Self-pay

## 2023-06-01 ENCOUNTER — Other Ambulatory Visit: Payer: Self-pay

## 2023-06-02 NOTE — Telephone Encounter (Signed)
Attempted to reach patient on home no answer but left message and attempted cell not able to leave a message at this time.

## 2023-06-05 ENCOUNTER — Encounter (HOSPITAL_COMMUNITY)
Admission: RE | Admit: 2023-06-05 | Discharge: 2023-06-05 | Disposition: A | Discharge status: Home / Self Care | Payer: Medicaid Other | Source: Ambulatory Visit | Attending: Nurse Practitioner | Admitting: Nurse Practitioner

## 2023-06-05 DIAGNOSIS — Z9049 Acquired absence of other specified parts of digestive tract: Secondary | ICD-10-CM | POA: Insufficient documentation

## 2023-06-05 DIAGNOSIS — L24B3 Irritant contact dermatitis related to fecal or urinary stoma or fistula: Secondary | ICD-10-CM | POA: Diagnosis not present

## 2023-06-05 DIAGNOSIS — Z432 Encounter for attention to ileostomy: Secondary | ICD-10-CM

## 2023-06-05 DIAGNOSIS — K9419 Other complications of enterostomy: Secondary | ICD-10-CM | POA: Diagnosis not present

## 2023-06-05 DIAGNOSIS — K469 Unspecified abdominal hernia without obstruction or gangrene: Secondary | ICD-10-CM | POA: Diagnosis not present

## 2023-06-05 DIAGNOSIS — Z7689 Persons encountering health services in other specified circumstances: Secondary | ICD-10-CM | POA: Diagnosis not present

## 2023-06-05 NOTE — Progress Notes (Signed)
[facility]   Reason for visit:  RLQ ileostomy with nonhealing midline wound  Abdominal hernia HPI:  Diverticulitis with colectomy and end ileostomy Past Medical History:  Diagnosis Date   Acute respiratory failure with hypoxia (HCC) 12/23/2022   Alcohol withdrawal syndrome, with delirium (HCC) 12/27/2022   Diabetes mellitus without complication (HCC)    Diverticulitis    DVT (deep venous thrombosis) (HCC)    x3   Encephalopathy acute 12/28/2022   ETOH abuse    H/O colectomy    Hyperlipidemia    Hypertension    Sepsis associated hypotension (HCC) 08/03/2019   Smoker    Family History  Problem Relation Age of Onset   COPD Mother    Prostate cancer Father    Colon cancer Neg Hx    Rectal cancer Neg Hx    Stomach cancer Neg Hx    Esophageal cancer Neg Hx    Allergies  Allergen Reactions   Lisinopril     Other reaction(s): renal effects   Codeine Other (See Comments) and Hives    unknown Other reaction(s): Other (See Comments) unknown unknown   Current Outpatient Medications  Medication Sig Dispense Refill Last Dose/Taking   acetaminophen (ACETAMINOPHEN 8 HOUR) 650 MG CR tablet Take 1 tablet (650 mg total) by mouth every 8 (eight) hours as needed for pain 90 tablet 1    amLODipine (NORVASC) 10 MG tablet Take 1 tablet (10 mg total) by mouth daily. 90 tablet 0    ascorbic acid (VITAMIN C) 500 MG tablet Take 1 tablet (500 mg total) by mouth daily. 30 tablet 1    atorvastatin (LIPITOR) 10 MG tablet Take 1 tablet (10 mg total) by mouth daily. 90 tablet 0    cetirizine (ZYRTEC) 10 MG tablet Take 1 tablet (10 mg total) by mouth daily. 90 tablet 0    dapagliflozin propanediol (FARXIGA) 10 MG TABS tablet Take 1 tablet (10 mg total) by mouth daily before breakfast. 90 tablet 2    fluticasone (FLOVENT HFA) 110 MCG/ACT inhaler Inhale 2 puffs into the lungs 2 (two) times daily. 1 each 2    folic acid (FOLVITE) 1 MG tablet Take 1 tablet (1 mg total) by mouth daily. 90  tablet 0    gabapentin (NEURONTIN) 300 MG capsule Take 1 capsule (300 mg total) by mouth at bedtime. 90 capsule 3    hydrOXYzine (VISTARIL) 50 MG capsule Take 1 capsule (50 mg total) by mouth at bedtime as needed. 30 capsule 2    losartan (COZAAR) 25 MG tablet Take 1 tablet (25 mg total) by mouth daily. 30 tablet 5    metFORMIN (GLUCOPHAGE) 500 MG tablet Take 2 tablets (1,000 mg total) by mouth daily with breakfast AND 1 tablet (500 mg total) daily with supper. 90 tablet 3    metoprolol succinate (TOPROL-XL) 25 MG 24 hr tablet Take 1 tablet (25 mg total) by mouth daily. 90 tablet 3    Multiple Vitamin (MULTIVITAMIN WITH MINERALS) TABS tablet Take 1 tablet by mouth every other day.       omega-3 acid ethyl esters (LOVAZA) 1 g capsule Take 1 capsule (1 g total) by mouth daily. 90 capsule 3    thiamine (VITAMIN B1) 100 MG tablet Take 1 tablet (100 mg total) by mouth daily. 90 tablet 0    torsemide (DEMADEX) 20 MG tablet Take 1 tablet (20 mg total) by mouth daily. 30 tablet 1    triamcinolone cream (KENALOG) 0.5 % Apply topically to legs twice  daily for up to 2 weeks as needed for eczema 30 g 1    triamcinolone ointment (KENALOG) 0.1 % Apply 1 Application topically 2 (two) times daily. To affected area on abdomen 75 g 0    No current facility-administered medications for this encounter.   ROS  Review of Systems  Constitutional:  Positive for fatigue.  Respiratory:  Positive for cough and shortness of breath.   Gastrointestinal:        RLQ ileostomy Abdominal hernia  Musculoskeletal:  Positive for gait problem.  Skin:  Positive for rash and wound.  All other systems reviewed and are negative.  Vital signs:  There were no vitals taken for this visit. Exam:  Physical Exam Vitals reviewed.  Constitutional:      Appearance: He is obese.  Cardiovascular:     Rate and Rhythm: Normal rate and regular rhythm.     Comments: pacemaker Abdominal:     General: There is distension.     Hernia: A  hernia is present.  Skin:    General: Skin is warm and dry.     Findings: Erythema present.  Neurological:     Mental Status: He is alert and oriented to person, place, and time.     Sensory: Sensory deficit present.     Gait: Gait abnormal.     Comments: Neuropathy in feet, he reports.   Psychiatric:        Mood and Affect: Mood normal.        Behavior: Behavior normal.     Stoma type/location:  RLQ ileostomy  Stomal assessment/size:  1" recessed Peristomal assessment:  erythematous intact Treatment options for stomal/peristomal skin: barrier ring and 1 piece flat pouch Output: liquid green stool Ostomy pouching: 1pc. flat  Education provided:  we perform wound care for nonhealing abdominal wound. Cleansed with VASHE, applied aquacel to midline wounds.  Covered with gauze and ABD pads and tape.     Impression/dx  Ileostomy Nonhealing wound Discussion  See back as needed Plan  Change pouch and abdominal wound 2-3 times weekly (dependent on drainage)    Visit time: 45 minutes.   Mike Gip FNP-BC

## 2023-06-08 ENCOUNTER — Other Ambulatory Visit: Payer: Self-pay

## 2023-06-08 ENCOUNTER — Other Ambulatory Visit (HOSPITAL_COMMUNITY): Payer: Self-pay

## 2023-06-08 NOTE — Discharge Instructions (Signed)
VASHE daily to irritated skin on abdomen.   Aquacel to open areas Desitin to pannus as needed.

## 2023-06-12 ENCOUNTER — Other Ambulatory Visit: Payer: Self-pay | Admitting: *Deleted

## 2023-06-12 ENCOUNTER — Ambulatory Visit (HOSPITAL_COMMUNITY): Payer: Medicaid Other | Admitting: Nurse Practitioner

## 2023-06-12 DIAGNOSIS — Z932 Ileostomy status: Secondary | ICD-10-CM | POA: Diagnosis not present

## 2023-06-12 DIAGNOSIS — S31109A Unspecified open wound of abdominal wall, unspecified quadrant without penetration into peritoneal cavity, initial encounter: Secondary | ICD-10-CM | POA: Diagnosis not present

## 2023-06-12 NOTE — Patient Instructions (Signed)
Visit Information  Luis [name] was given information about Medicaid Managed Care team care coordination services as a part of their [facility] Medicaid benefit. Luis [name] verbally consented to engagement with the [facility] Managed Care team.   If you are experiencing a medical emergency, please call 911 or report to your local emergency department or urgent care.   If you have a non-emergency medical problem during routine business hours, please contact your provider's office and ask to speak with a nurse.   For questions related to your [facility] health plan, please call: [phone] or go here:[email or web address]  If you would like to schedule transportation through your [facility] plan, please call the following number at least 2 days in advance of your appointment: [phone].   You can also use the MTM portal or MTM mobile app to manage your rides. Reimbursement for transportation is available through [facility]! For the portal, please go to [email or web address]  Call the [facility] Crisis Line at [phone], at any time, 24 hours a day, 7 days a week. If you are in danger or need immediate medical attention call 911.  If you would like help to quit smoking, call 1-800-QUIT-NOW (916 646 8443) OR Espaol: 1-855-Djelo-Ya (3-295-188-4166) o para ms informacin haga clic aqu or Text READY to 063-016 to register via text  Luis [name],   Please see education materials related to Neuropathy provided by MyChart link.  Patient verbalizes understanding of instructions and care plan provided today and agrees to view in MyChart. Active MyChart status and patient understanding of how to access instructions and care plan via MyChart confirmed with patient.     Telephone follow up appointment with Managed Medicaid care management team member scheduled for:07/12/23 at 2:30pm  [name] RN, BSN [place]  [facility] RN  Care Coordinator [phone]   Following is a copy of your plan of care:  Care Plan : RN Care Manager Plan of Care  Updates made by Heidi Dach, RN since 06/12/2023 12:00 AM     Problem: Health Management needs related to Incisional Hernia      Long-Range Goal: Development of Plan of Care to address Health Management needs related to Incisional Hernia   Start Date: 04/12/2023  Expected End Date: 07/11/2023  Note:   Current Barriers:  Knowledge Deficits related to plan of care for management of Incisional Hernia Luis [name] missed an appointment with the Wound Clinic today due to transportation. He is upset that he had to reschedule the visit. He has not followed up with Dr. Jimmie Molly regarding his hernia repair.  RNCM Clinical Goal(s):  Patient will verbalize understanding of plan for management of Incisional Hernia as evidenced by Patient reports take all medications exactly as prescribed and will call provider for medication related questions as evidenced by patient reports attend all scheduled medical appointments:   06/15/23 with PCP, 06/16/23 with Wound Clinic and 06/20/23 with [facility] as evidenced by Provider documentation in EMR continue to work with RN Care Manager to address care management and care coordination needs related to  Incisional Hernia as evidenced by adherence to CM Team Scheduled appointments work with Child psychotherapist to address  related to the management of Financial constraints related to affording mortgage and utilities, Limited access to food, and Mental Health Concerns  related to the management of Incisional Hernia as evidenced by review of EMR and patient or social worker report through collaboration with Medical illustrator, provider, and care team.   Interventions: Evaluation of current  treatment plan related to  self management and patient's adherence to plan as established by provider   Incisional Hernia  (Status:  Goal on track:  Yes.)  Long Term Goal Evaluation  of current treatment plan related to  Incisional Hernia ,  self-management and patient's adherence to plan as established by provider. Discussed plans with patient for ongoing care management follow up and provided patient with direct contact information for care management team Assessed social determinant of health barriers Advised patient to contact [facility] office [phone] to discuss CT results Provided patient with education on Health Maintenance Provided education on quitting alcohol Advised patient to contact [facility] Member Services [phone] to file a grievance regarding transportation issues Advised patient to schedule follow up with Cardiology Reviewed and discusses provider notes   Patient Goals/Self-Care Activities: Take all medications as prescribed Attend all scheduled provider appointments Call provider office for new concerns or questions  Work with the social worker to address care coordination needs and will continue to work with the clinical team to address health care and disease management related needs  Follow Up Plan:  Telephone follow up appointment with care management team member scheduled for:  07/11/22 at 2:30pm

## 2023-06-12 NOTE — Patient Outreach (Signed)
Medicaid Managed Care   Nurse Care Manager Note  06/12/2023 Name:  Luis [name] MRN:  [id number] DOB:  [birth year]  Luis [name] is an 62 y.o. year old male who is a primary patient of [name], NP.  The [facility] Managed Care Coordination team was consulted for assistance with:    Hernia repair  Luis [name] was given information about Medicaid Managed Care Coordination team services today. Luis [name] Patient agreed to services and verbal consent obtained.  Engaged with patient by telephone for follow up visit in response to provider referral for case management and/or care coordination services.   Patient is participating in a Managed Medicaid Plan:  Yes  Assessments/Interventions:  Review of past medical history, allergies, medications, health status, including review of consultants reports, laboratory and other test data, was performed as part of comprehensive evaluation and provision of chronic care management services.  SDOH (Social Drivers of Health) assessments and interventions performed: SDOH Interventions    Flowsheet Row Patient Outreach Telephone from 04/18/2023 in [place] POPULATION HEALTH DEPARTMENT Patient Outreach Telephone from 04/12/2023 in [place] POPULATION HEALTH DEPARTMENT  SDOH Interventions    Food Insecurity Interventions -- Other (Comment)  [BSW referral]  Housing Interventions -- Other (Comment)  [BSW referral]  Transportation Interventions -- Payor Benefit  Utilities Interventions -- Intervention Not Indicated  Depression Interventions/Treatment  --  [Pt will consider therapy] --  Stress Interventions Provide Counseling  [Patient reports that he does have stress but does not feel like he wants to start treatment at this time or make that committment to therapy just yet] --       Care Plan  Allergies  Allergen Reactions   Lisinopril     Other reaction(s): renal effects   Codeine Other (See Comments) and Hives    unknown Other  reaction(s): Other (See Comments) unknown unknown    Medications Reviewed Today     Reviewed by Heidi Dach, RN (Registered Nurse) on 06/12/23 at 1413  Med List Status: <None>   Medication Order Taking? Sig Documenting Provider Last Dose Status Informant  acetaminophen (ACETAMINOPHEN 8 HOUR) 650 MG CR tablet [id number] Yes Take 1 tablet (650 mg total) by mouth every 8 (eight) hours as needed for pain  Taking Active   amLODipine (NORVASC) 10 MG tablet [id number] Yes Take 1 tablet (10 mg total) by mouth daily. [name], NP Taking Active   ascorbic acid (VITAMIN C) 500 MG tablet [id number] Yes Take 1 tablet (500 mg total) by mouth daily.  Taking Active   atorvastatin (LIPITOR) 10 MG tablet [id number] Yes Take 1 tablet (10 mg total) by mouth daily. [name], NP Taking Active   cetirizine (ZYRTEC) 10 MG tablet [id number] Yes Take 1 tablet (10 mg total) by mouth daily. [name], NP Taking Active   dapagliflozin propanediol (FARXIGA) 10 MG TABS tablet [id number] Yes Take 1 tablet (10 mg total) by mouth daily before breakfast. [name], NP Taking Active   fluticasone (FLOVENT HFA) 110 MCG/ACT inhaler [id number] Yes Inhale 2 puffs into the lungs 2 (two) times daily. Barnetta Chapel, MD Taking Active Self, Pharmacy Records  folic acid (FOLVITE) 1 MG tablet [id number] Yes Take 1 tablet (1 mg total) by mouth daily. [name], NP Taking Active   gabapentin (NEURONTIN) 300 MG capsule [id number] Yes Take 1 capsule (300 mg total) by mouth at bedtime. Louann Sjogren, DPM Taking Active   hydrOXYzine (VISTARIL) 50 MG capsule [id number] Yes Take 1 capsule (50  mg total) by mouth at bedtime as needed. [name], NP Taking Active     Discontinued 03/15/23 1105 (Change in therapy)   losartan (COZAAR) 25 MG tablet [id number] Yes Take 1 tablet (25 mg total) by mouth daily. Dorthula Nettles, DO Taking Active   metFORMIN (GLUCOPHAGE) 500 MG tablet [id number] Yes Take 2 tablets (1,000 mg total) by  mouth daily with breakfast AND 1 tablet (500 mg total) daily with supper. [name], NP Taking Active   metoprolol succinate (TOPROL-XL) 25 MG 24 hr tablet [id number] Yes Take 1 tablet (25 mg total) by mouth daily. Dorthula Nettles, DO Taking Active   Multiple Vitamin (MULTIVITAMIN WITH MINERALS) TABS tablet [id number] Yes Take 1 tablet by mouth every other day.  [provider] Taking Active Self, Pharmacy Records  omega-3 acid ethyl esters (LOVAZA) 1 g capsule [id number] Yes Take 1 capsule (1 g total) by mouth daily. [name], NP Taking Active   thiamine (VITAMIN B1) 100 MG tablet [id number] Yes Take 1 tablet (100 mg total) by mouth daily.  Taking Active   torsemide (DEMADEX) 20 MG tablet [id number] Yes Take 1 tablet (20 mg total) by mouth daily. Noralee Stain, DO Taking Active   triamcinolone cream (KENALOG) 0.5 % [id number] Yes Apply topically to legs twice daily for up to 2 weeks as needed for eczema  Taking Active   triamcinolone ointment (KENALOG) 0.1 % [id number] Yes Apply 1 Application topically 2 (two) times daily. To affected area on abdomen Gwenith Daily, MD Taking Active             Patient Active Problem List   Diagnosis Date Noted   At risk for falling 03/02/2023   Shortness of breath 03/02/2023   Needs flu shot 03/02/2023   HLD (hyperlipidemia) 02/19/2023   Demand ischemia (HCC) 02/19/2023   COPD with acute exacerbation (HCC) 02/16/2023   Acute exacerbation of CHF (congestive heart failure) (HCC) 02/15/2023   Type 2 diabetes mellitus with diabetic polyneuropathy, without long-term current use of insulin (HCC) 01/20/2023   Chronic combined systolic and diastolic heart failure (HCC) 01/20/2023   Acute on chronic heart failure with preserved ejection fraction (HFpEF) (HCC) 12/23/2022   Encounter for smoking cessation counseling 12/09/2022   Prostate cancer screening 12/09/2022   Alcoholism (HCC) 12/09/2022   Abdominal wall hernia at previous stoma site  12/02/2022   Nonhealing surgical wound, subsequent encounter 11/24/2022   Abdominal hernia without obstruction and without gangrene 11/22/2022   Class 1 obesity with serious comorbidity and body mass index (BMI) of 32.0 to 32.9 in adult 11/04/2022   Insomnia 11/04/2022   Elevated hemoglobin (HCC) 11/04/2022   Smoker 11/04/2022   Irritant contact dermatitis associated with digestive stoma 11/02/2022   Ileostomy prolapse (HCC) 08/18/2022   Ileostomy present (HCC) 08/08/2022   Bilateral inguinal hernia without obstruction or gangrene 08/08/2022   Surgical wound, non healing 04/26/2022   Ileostomy dysfunction (HCC) 04/12/2022   Non-recurrent bilateral inguinal hernia without obstruction or gangrene 03/22/2022   Incisional hernia, without obstruction or gangrene    Nonhealing nonsurgical wound    Ileostomy stenosis (HCC)    Irritant contact dermatitis associated with fecal stoma    Ileostomy care [facility])    Pacemaker    AV block, 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   Chronic abdominal wound infection, subsequent encounter 12/26/2020   Alcohol dependence with uncomplicated withdrawal (HCC) 12/26/2020   Tobacco dependence 12/26/2020   Essential hypertension 12/26/2020   Intra-abdominal abscess (HCC) 08/03/2019   Type 2  diabetes mellitus with hyperlipidemia (HCC) 08/03/2019   History of DVT (deep vein thrombosis) 08/03/2019    Conditions to be addressed/monitored per PCP order:   Hernia repair  Care Plan : RN Care Manager Plan of Care  Updates made by Heidi Dach, RN since 06/12/2023 12:00 AM     Problem: Health Management needs related to Incisional Hernia      Long-Range Goal: Development of Plan of Care to address Health Management needs related to Incisional Hernia   Start Date: 04/12/2023  Expected End Date: 07/11/2023  Note:   Current Barriers:  Knowledge Deficits related to plan of care for management of Incisional Hernia Luis [name] missed an appointment with the [facility]  today due to transportation. He is upset that he had to reschedule the visit. He has not followed up with Dr. Jimmie Molly regarding his hernia repair.  RNCM Clinical Goal(s):  Patient will verbalize understanding of plan for management of Incisional Hernia as evidenced by Patient reports take all medications exactly as prescribed and will call provider for medication related questions as evidenced by patient reports attend all scheduled medical appointments:   06/15/23 with PCP, 06/16/23 with Wound Clinic and 06/20/23 with [facility] as evidenced by Provider documentation in EMR continue to work with RN Care Manager to address care management and care coordination needs related to  Incisional Hernia as evidenced by adherence to CM Team Scheduled appointments work with Child psychotherapist to address  related to the management of Financial constraints related to affording mortgage and utilities, Limited access to food, and Mental Health Concerns  related to the management of Incisional Hernia as evidenced by review of EMR and patient or Child psychotherapist report through collaboration with Medical illustrator, provider, and care team.   Interventions: Evaluation of current treatment plan related to  self management and patient's adherence to plan as established by provider   Incisional Hernia  (Status:  Goal on track:  Yes.)  Long Term Goal Evaluation of current treatment plan related to  Incisional Hernia ,  self-management and patient's adherence to plan as established by provider. Discussed plans with patient for ongoing care management follow up and provided patient with direct contact information for care management team Assessed social determinant of health barriers Advised patient to contact [facility] office [phone] to discuss CT results Provided patient with education on Health Maintenance Provided education on quitting alcohol Advised patient to contact [facility] Member Services [phone] to file a grievance  regarding transportation issues Advised patient to schedule follow up with Cardiology Reviewed and discusses provider notes   Patient Goals/Self-Care Activities: Take all medications as prescribed Attend all scheduled provider appointments Call provider office for new concerns or questions  Work with the social worker to address care coordination needs and will continue to work with the clinical team to address health care and disease management related needs  Follow Up Plan:  Telephone follow up appointment with care management team member scheduled for:  07/11/22 at 2:30pm      Follow Up:  Patient agrees to Care Plan and Follow-up.  Plan: The Managed Medicaid care management team will reach out to the patient again over the next 30 days.  Date/time of next scheduled RN care management/care coordination outreach:  07/12/23 at 2:30pm  [name] RN, BSN [place]  [facility] RN Care Coordinator [phone]

## 2023-06-13 ENCOUNTER — Other Ambulatory Visit: Payer: Self-pay

## 2023-06-14 DIAGNOSIS — Z419 Encounter for procedure for purposes other than remedying health state, unspecified: Secondary | ICD-10-CM | POA: Diagnosis not present

## 2023-06-15 ENCOUNTER — Ambulatory Visit: Payer: Medicaid Other | Admitting: Nurse Practitioner

## 2023-06-15 VITALS — BP 112/70 | HR 95 | Temp 97.8°F | Ht 73.0 in | Wt 234.0 lb

## 2023-06-15 DIAGNOSIS — E785 Hyperlipidemia, unspecified: Secondary | ICD-10-CM

## 2023-06-15 DIAGNOSIS — Z9181 History of falling: Secondary | ICD-10-CM

## 2023-06-15 DIAGNOSIS — Z7689 Persons encountering health services in other specified circumstances: Secondary | ICD-10-CM | POA: Diagnosis not present

## 2023-06-15 DIAGNOSIS — I5042 Chronic combined systolic (congestive) and diastolic (congestive) heart failure: Secondary | ICD-10-CM | POA: Diagnosis not present

## 2023-06-15 NOTE — Patient Instructions (Signed)
 Ask about shingles and RSV vaccines at pharmacy

## 2023-06-15 NOTE — Assessment & Plan Note (Signed)
 Chronic Recommended repeat lipid panel today, patient declined.  Patient encouraged to come to next office visit fasting lipid panel could be collected.  He reports his understanding.

## 2023-06-15 NOTE — Progress Notes (Signed)
 Established Patient Office Visit  Subjective   Patient ID: Luis [name], male    DOB: Mar 19, 1961  Age: 63 y.o. MRN: [id number]  Chief Complaint  Patient presents with   Hyperlipidemia    Patient has today for routine follow-up.  Hyperlipidemia: Continues on atorvastatin , was encouraged to start Lovaza  or Vascepa  depending on insurance coverage at last office visit.  He reports he is taking a fish oil supplement.  Is not willing to undergo serum checked today.  Risk for falls: Reports he still feels off balance at times.  Was unable to get shower rails due to insurance denying coverage.  Does have a walker at home but does not use this regularly.  Denies any recent falls.  Since I saw him last he seen multiple specialists including podiatry, dermatology, cardiology, electrophysiology, wound specialist at surgeon office, pharmacist, pulmonology.  He continues to have his chronic abdominal wound which per patient seems to be slowly healing.  He continues to smoke and drink alcohol but reports he is trying to cut back.  He is waiting for wound to heal so he can undergo ileostomy reversal.  Reports he is not ready to quit drinking alcohol or smoking cigarettes until he has a surgical date.  He had biopsy of skin around chronic wound as there was concern for possible skin cancer.  Biopsy was negative for cancer but did identify chronic contact dermatitis.  Dermatologist recommend use of triamcinolone .  Patient reports using this but only sparingly as he is afraid of thinning of the skin as possible side effects.  Reports has upcoming follow-up with cardiology who follows him for chronic HFpEF.  Today denies chest pain, leg swelling, shortness of breath.  Continues on amlodipine  10 mg daily, Farxiga  10 mg daily, losartan  25 mg daily, metoprolol  25 mg daily, torsemide  20 mg daily.  He reports tolerating his medications well, reports stable weights.    Review of Systems  Respiratory:  Negative for  cough and shortness of breath.   Cardiovascular:  Negative for chest pain and leg swelling.      Objective:     BP 112/70   Pulse 95   Temp 97.8 F (36.6 C) (Temporal)   Ht 6' 1 (1.854 m)   Wt 234 lb (106.1 kg)   SpO2 94%   BMI 30.87 kg/m  BP Readings from Last 3 Encounters:  06/15/23 112/70  05/30/23 (!) 147/98  05/29/23 (!) 161/60   Wt Readings from Last 3 Encounters:  06/15/23 234 lb (106.1 kg)  05/29/23 240 lb 9.6 oz (109.1 kg)  04/26/23 235 lb (106.6 kg)      Physical Exam Vitals reviewed.  Constitutional:      Appearance: Normal appearance.  HENT:     Head: Normocephalic and atraumatic.  Cardiovascular:     Rate and Rhythm: Normal rate and regular rhythm.  Pulmonary:     Effort: Pulmonary effort is normal.     Breath sounds: Normal breath sounds.  Abdominal:     Comments: (+) Ileostomy (+) Chronic abdominal wound, currently has dressing in place.  Dressing clean dry and intact.  Musculoskeletal:     Cervical back: Neck supple.  Skin:    General: Skin is warm and dry.  Neurological:     Mental Status: He is alert and oriented to person, place, and time.  Psychiatric:        Mood and Affect: Mood normal.        Behavior: Behavior normal.  Thought Content: Thought content normal.        Judgment: Judgment normal.      No results found for any visits on 06/15/23.    The ASCVD Risk score (Arnett DK, et al., 2019) failed to calculate for the following reasons:   The valid total cholesterol range is 130 to 320 mg/dL    Assessment & Plan:   Problem List Items Addressed This Visit       Cardiovascular and Mediastinum   Chronic combined systolic and diastolic heart failure (HCC)   Chronic Appears euvolemic on exam today, weight stable Blood pressure stable and at goal Continue amlodipine  10 mg daily, Farxiga  10 mg daily, losartan  25 mg daily, metoprolol  25 mg daily, and torsemide  20 mg daily.        Other   HLD (hyperlipidemia)    Chronic Recommended repeat lipid panel today, patient declined.  Patient encouraged to come to next office visit fasting lipid panel could be collected.  He reports his understanding.      At risk for falling - Primary   Chronic Patient provided with DME prescriptions for straight cane, shower chair, and shower railings in case insurance coverage change with the new year.      Relevant Orders   For home use only DME Other see comment   For home use only DME Other see comment   For home use only DME Other see comment    Return in about 3 months (around 09/13/2023) for F/U with Lauraine - needs 40 minutes.  Total time spent on encounter today was 30 minutes including face-to-face evaluation, review of previous medical records, and development/discussion of treatment plan.   Lauraine FORBES Pereyra, NP

## 2023-06-15 NOTE — Assessment & Plan Note (Signed)
 Chronic Patient provided with DME prescriptions for straight cane, shower chair, and shower railings in case insurance coverage change with the new year.

## 2023-06-15 NOTE — Assessment & Plan Note (Signed)
 Chronic Appears euvolemic on exam today, weight stable Blood pressure stable and at goal Continue amlodipine 10 mg daily, Farxiga 10 mg daily, losartan 25 mg daily, metoprolol 25 mg daily, and torsemide 20 mg daily.

## 2023-06-16 ENCOUNTER — Other Ambulatory Visit (HOSPITAL_COMMUNITY): Payer: Self-pay

## 2023-06-16 ENCOUNTER — Other Ambulatory Visit: Payer: Self-pay

## 2023-06-16 ENCOUNTER — Ambulatory Visit (HOSPITAL_COMMUNITY)
Admission: RE | Admit: 2023-06-16 | Discharge: 2023-06-16 | Disposition: A | Discharge status: Home / Self Care | Payer: Medicaid Other | Source: Ambulatory Visit | Attending: Plastic Surgery | Admitting: Plastic Surgery

## 2023-06-16 DIAGNOSIS — K429 Umbilical hernia without obstruction or gangrene: Secondary | ICD-10-CM | POA: Diagnosis not present

## 2023-06-16 DIAGNOSIS — Z932 Ileostomy status: Secondary | ICD-10-CM | POA: Insufficient documentation

## 2023-06-16 DIAGNOSIS — Z432 Encounter for attention to ileostomy: Secondary | ICD-10-CM | POA: Diagnosis not present

## 2023-06-16 DIAGNOSIS — L24B3 Irritant contact dermatitis related to fecal or urinary stoma or fistula: Secondary | ICD-10-CM

## 2023-06-16 DIAGNOSIS — Z7689 Persons encountering health services in other specified circumstances: Secondary | ICD-10-CM | POA: Diagnosis not present

## 2023-06-16 NOTE — Progress Notes (Signed)
 [facility]   Reason for visit:  RLQ ileostomy Nonhealing surgical wound Abdominal hernia HPI:  Ruptured diverticulum with end ileostomy Abdominal hernia withnonhealing abdominal wound Past Medical History:  Diagnosis Date   Acute respiratory failure with hypoxia (HCC) 12/23/2022   Alcohol withdrawal syndrome, with delirium (HCC) 12/27/2022   Diabetes mellitus without complication (HCC)    Diverticulitis    DVT (deep venous thrombosis) (HCC)    x3   Encephalopathy acute 12/28/2022   ETOH abuse    H/O colectomy    Hyperlipidemia    Hypertension    Sepsis associated hypotension (HCC) 08/03/2019   Smoker    Family History  Problem Relation Age of Onset   COPD Mother    Prostate cancer Father    Colon cancer Neg Hx    Rectal cancer Neg Hx    Stomach cancer Neg Hx    Esophageal cancer Neg Hx    Allergies  Allergen Reactions   Lisinopril     Other reaction(s): renal effects   Codeine Other (See Comments) and Hives    unknown Other reaction(s): Other (See Comments) unknown unknown   Current Outpatient Medications  Medication Sig Dispense Refill Last Dose/Taking   acetaminophen  (ACETAMINOPHEN  8 HOUR) 650 MG CR tablet Take 1 tablet (650 mg total) by mouth every 8 (eight) hours as needed for pain 90 tablet 1    amLODipine  (NORVASC ) 10 MG tablet Take 1 tablet (10 mg total) by mouth daily. 90 tablet 0    ascorbic acid  (VITAMIN C ) 500 MG tablet Take 1 tablet (500 mg total) by mouth daily. 30 tablet 1    atorvastatin  (LIPITOR) 10 MG tablet Take 1 tablet (10 mg total) by mouth daily. 90 tablet 0    cetirizine  (ZYRTEC ) 10 MG tablet Take 1 tablet (10 mg total) by mouth daily. 90 tablet 0    dapagliflozin  propanediol (FARXIGA ) 10 MG TABS tablet Take 1 tablet (10 mg total) by mouth daily before breakfast. 90 tablet 2    fluticasone  (FLOVENT  HFA) 110 MCG/ACT inhaler Inhale 2 puffs into the lungs 2 (two) times daily. 1 each 2    folic acid  (FOLVITE ) 1 MG tablet Take 1  tablet (1 mg total) by mouth daily. 90 tablet 0    gabapentin  (NEURONTIN ) 300 MG capsule Take 1 capsule (300 mg total) by mouth at bedtime. 90 capsule 3    hydrOXYzine  (VISTARIL ) 50 MG capsule Take 1 capsule (50 mg total) by mouth at bedtime as needed. 30 capsule 2    losartan  (COZAAR ) 25 MG tablet Take 1 tablet (25 mg total) by mouth daily. 30 tablet 5    metFORMIN  (GLUCOPHAGE ) 500 MG tablet Take 2 tablets (1,000 mg total) by mouth daily with breakfast AND 1 tablet (500 mg total) daily with supper. 90 tablet 3    metoprolol  succinate (TOPROL -XL) 25 MG 24 hr tablet Take 1 tablet (25 mg total) by mouth daily. 90 tablet 3    Multiple Vitamin (MULTIVITAMIN WITH MINERALS) TABS tablet Take 1 tablet by mouth every other day.       omega-3 acid ethyl esters (LOVAZA ) 1 g capsule Take 1 capsule (1 g total) by mouth daily. 90 capsule 3    thiamine  (VITAMIN B1) 100 MG tablet Take 1 tablet (100 mg total) by mouth daily. 90 tablet 0    torsemide  (DEMADEX ) 20 MG tablet Take 1 tablet (20 mg total) by mouth daily. 30 tablet 1    torsemide  (DEMADEX ) 20 MG tablet Take 1 tablet (  20 mg total) by mouth daily. 30 tablet 2    triamcinolone  cream (KENALOG ) 0.5 % Apply topically to legs twice daily for up to 2 weeks as needed for eczema 30 g 1    triamcinolone  ointment (KENALOG ) 0.1 % Apply 1 Application topically 2 (two) times daily. To affected area on abdomen 75 g 0    No current facility-administered medications for this encounter.   ROS  Review of Systems  Constitutional:  Positive for unexpected weight change.       Weight fluctuates up and down with fluid retention  HENT:  Positive for sinus pain and sneezing.   Cardiovascular:  Positive for leg swelling.       Pacemaker  Gastrointestinal:        RLQ ileostomy hernia  Musculoskeletal:  Positive for back pain and gait problem.       Uses wheelchair in hospital  Skin:  Positive for color change, rash and wound.  Psychiatric/Behavioral: Negative.    All  other systems reviewed and are negative.  Vital signs:  BP (!) 123/95 (BP Location: Right Arm)   Pulse 91   Temp 97.8 F (36.6 C) (Oral)   Resp 18   SpO2 94%  Exam:  Physical Exam Vitals reviewed.  Constitutional:      Appearance: He is obese.  Cardiovascular:     Rate and Rhythm: Normal rate and regular rhythm.     Pulses: Normal pulses.     Heart sounds: Normal heart sounds.  Pulmonary:     Effort: Pulmonary effort is normal.     Breath sounds: Wheezing present.  Abdominal:     Hernia: A hernia is present.  Skin:    General: Skin is warm and dry.     Findings: Erythema and rash present.     Comments: Irritation around stoma.  Medical adhesive related skin injury to abdomen from chronic tape use. Cleansing with VASHE and using minimal tape.  Abdominal pannus with redness and weeping. Desitin applied.   Neurological:     General: No focal deficit present.     Mental Status: He is alert and oriented to person, place, and time.  Psychiatric:        Mood and Affect: Mood normal.        Behavior: Behavior normal.     Stoma type/location:  RLQ ileostomy  Stomal assessment/size:  1 recessed  Peristomal assessment:  erythema  Treatment options for stomal/peristomal skin: barrier ring  1 piece pouch Output: liquid green stool Ostomy pouching: 1pc. Flat with barrier ring  Education provided:  continue same pouching  Wound care to abdomen performed.  Skin cleansed with VASHE.  Aquacel to open wounds. Cover with dry gauze and tape.      Impression/dx  Hernia Abdominal wound ileostomy Discussion  See back as needed.  Plan  Call for appointment    Visit time: 45 minutes.   Darice Cooley FNP-BC

## 2023-06-19 ENCOUNTER — Encounter (HOSPITAL_COMMUNITY)
Admission: RE | Admit: 2023-06-19 | Discharge: 2023-06-19 | Disposition: A | Discharge status: Home / Self Care | Payer: Medicaid Other | Source: Ambulatory Visit | Attending: Nurse Practitioner | Admitting: Nurse Practitioner

## 2023-06-19 DIAGNOSIS — L24B3 Irritant contact dermatitis related to fecal or urinary stoma or fistula: Secondary | ICD-10-CM | POA: Diagnosis not present

## 2023-06-19 DIAGNOSIS — Z432 Encounter for attention to ileostomy: Secondary | ICD-10-CM | POA: Insufficient documentation

## 2023-06-19 DIAGNOSIS — Z7689 Persons encountering health services in other specified circumstances: Secondary | ICD-10-CM | POA: Diagnosis not present

## 2023-06-19 DIAGNOSIS — K469 Unspecified abdominal hernia without obstruction or gangrene: Secondary | ICD-10-CM | POA: Diagnosis not present

## 2023-06-19 NOTE — Progress Notes (Signed)
 [facility]   Reason for visit:  RLQ ileostomy Nonhealing abdominal wound Abdominal hernia HPI:  Diverticulitis with perforation and end ileostomy Past Medical History:  Diagnosis Date   Acute respiratory failure with hypoxia (HCC) 12/23/2022   Alcohol withdrawal syndrome, with delirium (HCC) 12/27/2022   Diabetes mellitus without complication (HCC)    Diverticulitis    DVT (deep venous thrombosis) (HCC)    x3   Encephalopathy acute 12/28/2022   ETOH abuse    H/O colectomy    Hyperlipidemia    Hypertension    Sepsis associated hypotension (HCC) 08/03/2019   Smoker    Family History  Problem Relation Age of Onset   COPD Mother    Prostate cancer Father    Colon cancer Neg Hx    Rectal cancer Neg Hx    Stomach cancer Neg Hx    Esophageal cancer Neg Hx    Allergies  Allergen Reactions   Lisinopril     Other reaction(s): renal effects   Codeine Other (See Comments) and Hives    unknown Other reaction(s): Other (See Comments) unknown unknown   Current Outpatient Medications  Medication Sig Dispense Refill Last Dose/Taking   acetaminophen  (ACETAMINOPHEN  8 HOUR) 650 MG CR tablet Take 1 tablet (650 mg total) by mouth every 8 (eight) hours as needed for pain 90 tablet 1    amLODipine  (NORVASC ) 10 MG tablet Take 1 tablet (10 mg total) by mouth daily. 90 tablet 0    ascorbic acid  (VITAMIN C ) 500 MG tablet Take 1 tablet (500 mg total) by mouth daily. 30 tablet 1    atorvastatin  (LIPITOR) 10 MG tablet Take 1 tablet (10 mg total) by mouth daily. 90 tablet 0    cetirizine  (ZYRTEC ) 10 MG tablet Take 1 tablet (10 mg total) by mouth daily. 90 tablet 0    dapagliflozin  propanediol (FARXIGA ) 10 MG TABS tablet Take 1 tablet (10 mg total) by mouth daily before breakfast. 90 tablet 2    fluticasone  (FLOVENT  HFA) 110 MCG/ACT inhaler Inhale 2 puffs into the lungs 2 (two) times daily. 1 each 2    folic acid  (FOLVITE ) 1 MG tablet Take 1 tablet (1 mg total) by mouth daily. 90  tablet 0    gabapentin  (NEURONTIN ) 300 MG capsule Take 1 capsule (300 mg total) by mouth 2 (two) times daily. 90 capsule 3    hydrOXYzine  (VISTARIL ) 50 MG capsule Take 1 capsule (50 mg total) by mouth at bedtime as needed. 30 capsule 2    losartan  (COZAAR ) 25 MG tablet Take 1 tablet (25 mg total) by mouth daily. 30 tablet 5    metFORMIN  (GLUCOPHAGE ) 500 MG tablet Take 2 tablets (1,000 mg total) by mouth daily with breakfast AND 1 tablet (500 mg total) daily with supper. 90 tablet 3    metoprolol  succinate (TOPROL -XL) 25 MG 24 hr tablet Take 1 tablet (25 mg total) by mouth daily. 90 tablet 3    Multiple Vitamin (MULTIVITAMIN WITH MINERALS) TABS tablet Take 1 tablet by mouth every other day.       omega-3 acid ethyl esters (LOVAZA ) 1 g capsule Take 1 capsule (1 g total) by mouth daily. 90 capsule 3    thiamine  (VITAMIN B1) 100 MG tablet Take 1 tablet (100 mg total) by mouth daily. 90 tablet 0    torsemide  (DEMADEX ) 20 MG tablet Take 1 tablet (20 mg total) by mouth daily. 30 tablet 1    torsemide  (DEMADEX ) 20 MG tablet Take 1 tablet (20 mg  total) by mouth daily. 30 tablet 2    triamcinolone  cream (KENALOG ) 0.5 % Apply topically to legs twice daily for up to 2 weeks as needed for eczema 30 g 1    triamcinolone  ointment (KENALOG ) 0.1 % Apply 1 Application topically 2 (two) times daily. To affected area on abdomen 75 g 0    No current facility-administered medications for this encounter.   ROS  Review of Systems  Constitutional:  Positive for fatigue.  Respiratory:  Positive for shortness of breath.   Gastrointestinal:        Hernia RLQ ileostomy  Musculoskeletal:  Positive for gait problem.       USes wheelchair  Psychiatric/Behavioral:  Positive for agitation.   All other systems reviewed and are negative.  Vital signs:  BP 137/86 (BP Location: Right Arm)   Pulse 87   Temp 97.9 F (36.6 C) (Oral)   Resp 20   SpO2 93%  Exam:  Physical Exam Vitals reviewed.  Constitutional:       Appearance: He is obese.  Cardiovascular:     Rate and Rhythm: Normal rate and regular rhythm.     Comments: pacemaker Pulmonary:     Effort: Pulmonary effort is normal.     Breath sounds: Wheezing present.  Abdominal:     Hernia: A hernia is present.     Comments: Nonhealing wound RLQ ileostomy  Skin:    General: Skin is warm and dry.     Findings: Erythema, lesion and rash present.  Neurological:     Mental Status: He is alert and oriented to person, place, and time.  Psychiatric:        Mood and Affect: Mood normal.        Behavior: Behavior normal.     Stoma type/location:  RLQ ileostomy, recessed Stomal assessment/size:  1 round pink and moist Peristomal assessment:  erythema and tenderness Treatment options for stomal/peristomal skin: barrier ring and 1 piece flat pouch Output: liquid green stool Ostomy pouching: 1pc.flat with barrier ring Education provided:  pouch change performed Aquacel to abdominal wound after cleansing with VASHE cleanser.     Impression/dx  Nonhealing wound Ileostomy Contact dermatitis Discussion  See back as needed  struggles with performing wound care due to large abdominal hernia.  Impedes visibility of wound. Plan  See back as needed.     Visit time: 45 minutes.   Darice Cooley FNP-BC

## 2023-06-20 ENCOUNTER — Other Ambulatory Visit (HOSPITAL_COMMUNITY): Payer: Self-pay

## 2023-06-20 ENCOUNTER — Ambulatory Visit (INDEPENDENT_AMBULATORY_CARE_PROVIDER_SITE_OTHER): Payer: Medicaid Other | Admitting: Podiatry

## 2023-06-20 ENCOUNTER — Encounter: Payer: Self-pay | Admitting: Podiatry

## 2023-06-20 DIAGNOSIS — M79674 Pain in right toe(s): Secondary | ICD-10-CM | POA: Diagnosis not present

## 2023-06-20 DIAGNOSIS — E1142 Type 2 diabetes mellitus with diabetic polyneuropathy: Secondary | ICD-10-CM

## 2023-06-20 DIAGNOSIS — M79675 Pain in left toe(s): Secondary | ICD-10-CM | POA: Diagnosis not present

## 2023-06-20 DIAGNOSIS — B351 Tinea unguium: Secondary | ICD-10-CM

## 2023-06-20 DIAGNOSIS — Z7689 Persons encountering health services in other specified circumstances: Secondary | ICD-10-CM | POA: Diagnosis not present

## 2023-06-20 MED ORDER — GABAPENTIN 300 MG PO CAPS
300.0000 mg | ORAL_CAPSULE | Freq: Two times a day (BID) | ORAL | 3 refills | Status: DC
  Filled 2023-06-20 – 2023-07-29 (×3): qty 90, 45d supply, fill #0
  Filled 2023-09-20: qty 90, 45d supply, fill #1
  Filled 2023-10-22 – 2023-10-23 (×2): qty 90, 45d supply, fill #2
  Filled 2023-11-18: qty 90, 45d supply, fill #3

## 2023-06-20 NOTE — Progress Notes (Signed)
  Subjective:  Patient ID: Luis [name], male    DOB: 1961-04-18,   MRN: [id number]  No chief complaint on file.   63 y.o. male presents for concern of thickened elongated and painful nails that are difficult to trim. Requesting to have them trimmed today. Relates burning and tingling in their feet. Patient is diabetic and last A1c was  Lab Results  Component Value Date   HGBA1C 7.0 (H) 02/16/2023   .  Relates neuropathy symptoms have been worsening.   PCP:  Elnor [name], NP    . Denies any other pedal complaints. Denies n/v/f/c.   Past Medical History:  Diagnosis Date   Acute respiratory failure with hypoxia (HCC) 12/23/2022   Alcohol withdrawal syndrome, with delirium (HCC) 12/27/2022   Diabetes mellitus without complication (HCC)    Diverticulitis    DVT (deep venous thrombosis) (HCC)    x3   Encephalopathy acute 12/28/2022   ETOH abuse    H/O colectomy    Hyperlipidemia    Hypertension    Sepsis associated hypotension (HCC) 08/03/2019   Smoker     Objective:  Physical Exam: Vascular: DP/PT pulses 2/4 bilateral. CFT <3 seconds. Absent hair growth on digits. Edema noted to bilateral lower extremities. Xerosis noted bilaterally.  Skin. No lacerations or abrasions bilateral feet. Nails 1-5 bilateral  are thickened discolored and elongated with subungual debris.  Musculoskeletal: MMT 5/5 bilateral lower extremities in DF, PF, Inversion and Eversion. Deceased ROM in DF of ankle joint.  Neurological: Sensation intact to light touch. Protective sensation diminished bilateral.    Assessment:   1. Pain due to onychomycosis of toenails of both feet   2. Type 2 diabetes mellitus with diabetic polyneuropathy, without long-term current use of insulin  (HCC)      Plan:  Patient was evaluated and treated and all questions answered. -Discussed and educated patient on diabetic foot care, especially with  regards to the vascular, neurological and musculoskeletal systems.   -Stressed the importance of good glycemic control and the detriment of not  controlling glucose levels in relation to the foot. -Discussed supportive shoes at all times and checking feet regularly.  -Mechanically debrided all nails 1-5 bilateral using sterile nail nipper and filed with dremel without incident  -Awaiting DM shoes  -Will try gabapentin  300 mg nightly and 300 in the morning  for neuropathy symptoms.   -Answered all patient questions -Patient to return  in 3 months for at risk foot care -Patient advised to call the office if any problems or questions arise in the meantime.   Asberry Failing, DPM

## 2023-06-20 NOTE — Discharge Instructions (Signed)
 No changes

## 2023-06-21 ENCOUNTER — Telehealth: Payer: Self-pay

## 2023-06-21 NOTE — Telephone Encounter (Signed)
 Tried to call patient back as he has called re: Dm shoes  I [facility] the [facility] back [facility] in December and explained to patient we can no longer bill the Medicaid ins companies  I sent his info over to [facility] for him to be able to go there

## 2023-06-26 ENCOUNTER — Encounter (HOSPITAL_COMMUNITY)
Admission: RE | Admit: 2023-06-26 | Discharge: 2023-06-26 | Disposition: A | Discharge status: Home / Self Care | Payer: Medicaid Other | Source: Ambulatory Visit | Attending: Plastic Surgery | Admitting: Plastic Surgery

## 2023-06-26 DIAGNOSIS — L24B3 Irritant contact dermatitis related to fecal or urinary stoma or fistula: Secondary | ICD-10-CM

## 2023-06-26 DIAGNOSIS — K469 Unspecified abdominal hernia without obstruction or gangrene: Secondary | ICD-10-CM | POA: Diagnosis not present

## 2023-06-26 DIAGNOSIS — L249 Irritant contact dermatitis, unspecified cause: Secondary | ICD-10-CM | POA: Insufficient documentation

## 2023-06-26 DIAGNOSIS — K9413 Enterostomy malfunction: Secondary | ICD-10-CM | POA: Diagnosis not present

## 2023-06-26 DIAGNOSIS — T8189XD Other complications of procedures, not elsewhere classified, subsequent encounter: Secondary | ICD-10-CM

## 2023-06-26 DIAGNOSIS — Z432 Encounter for attention to ileostomy: Secondary | ICD-10-CM | POA: Diagnosis not present

## 2023-06-26 DIAGNOSIS — Z7689 Persons encountering health services in other specified circumstances: Secondary | ICD-10-CM | POA: Diagnosis not present

## 2023-06-26 NOTE — Progress Notes (Signed)
 [facility]   Reason for visit:  RLQ ileostomy Abdominal hernia Nonhealing wound HPI:  Diverticulitis with perforation and end ileostomy Past Medical History:  Diagnosis Date   Acute respiratory failure with hypoxia (HCC) 12/23/2022   Alcohol withdrawal syndrome, with delirium (HCC) 12/27/2022   Diabetes mellitus without complication (HCC)    Diverticulitis    DVT (deep venous thrombosis) (HCC)    x3   Encephalopathy acute 12/28/2022   ETOH abuse    H/O colectomy    Hyperlipidemia    Hypertension    Sepsis associated hypotension (HCC) 08/03/2019   Smoker    Family History  Problem Relation Age of Onset   COPD Mother    Prostate cancer Father    Colon cancer Neg Hx    Rectal cancer Neg Hx    Stomach cancer Neg Hx    Esophageal cancer Neg Hx    Allergies  Allergen Reactions   Lisinopril     Other reaction(s): renal effects   Codeine Other (See Comments) and Hives    unknown Other reaction(s): Other (See Comments) unknown unknown   Current Outpatient Medications  Medication Sig Dispense Refill Last Dose/Taking   acetaminophen  (ACETAMINOPHEN  8 HOUR) 650 MG CR tablet Take 1 tablet (650 mg total) by mouth every 8 (eight) hours as needed for pain 90 tablet 1    amLODipine  (NORVASC ) 10 MG tablet Take 1 tablet (10 mg total) by mouth daily. 90 tablet 0    ascorbic acid  (VITAMIN C ) 500 MG tablet Take 1 tablet (500 mg total) by mouth daily. 30 tablet 1    atorvastatin  (LIPITOR) 10 MG tablet Take 1 tablet (10 mg total) by mouth daily. 90 tablet 0    cetirizine  (ZYRTEC ) 10 MG tablet Take 1 tablet (10 mg total) by mouth daily. 90 tablet 1    dapagliflozin  propanediol (FARXIGA ) 10 MG TABS tablet Take 1 tablet (10 mg total) by mouth daily before breakfast. 90 tablet 2    fluticasone  (FLOVENT  HFA) 110 MCG/ACT inhaler Inhale 2 puffs into the lungs 2 (two) times daily. 1 each 2    folic acid  (FOLVITE ) 1 MG tablet Take 1 tablet (1 mg total) by mouth daily. 90 tablet 0     gabapentin  (NEURONTIN ) 300 MG capsule Take 1 capsule (300 mg total) by mouth 2 (two) times daily. 90 capsule 3    hydrOXYzine  (VISTARIL ) 50 MG capsule Take 1 capsule (50 mg total) by mouth at bedtime as needed. 30 capsule 2    losartan  (COZAAR ) 25 MG tablet Take 1 tablet (25 mg total) by mouth daily. 30 tablet 5    metFORMIN  (GLUCOPHAGE ) 500 MG tablet Take 2 tablets (1,000 mg total) by mouth daily with breakfast AND 1 tablet (500 mg total) daily with supper. 90 tablet 3    metoprolol  succinate (TOPROL -XL) 25 MG 24 hr tablet Take 1 tablet (25 mg total) by mouth daily. 90 tablet 3    Multiple Vitamin (MULTIVITAMIN WITH MINERALS) TABS tablet Take 1 tablet by mouth every other day.       omega-3 acid ethyl esters (LOVAZA ) 1 g capsule Take 1 capsule (1 g total) by mouth daily. 90 capsule 3    thiamine  (VITAMIN B1) 100 MG tablet Take 1 tablet (100 mg total) by mouth daily. 90 tablet 0    torsemide  (DEMADEX ) 20 MG tablet Take 1 tablet (20 mg total) by mouth daily. 30 tablet 1    torsemide  (DEMADEX ) 20 MG tablet Take 1 tablet (20 mg total)  by mouth daily. 30 tablet 2    triamcinolone  cream (KENALOG ) 0.5 % Apply topically to legs twice daily for up to 2 weeks as needed for eczema 30 g 1    triamcinolone  ointment (KENALOG ) 0.1 % Apply 1 Application topically 2 (two) times daily. To affected area on abdomen 75 g 0    No current facility-administered medications for this encounter.   ROS  Review of Systems  Constitutional:  Positive for fatigue.  Respiratory:  Positive for cough.        Smoker Hx respiratory failure  Cardiovascular:        Pacemaker in place  Gastrointestinal:        Abdominal hernia RLQ ileostomy  Musculoskeletal:  Positive for gait problem.       USes wheelchair in hospital due to shortness of breath with exertion  Skin:  Positive for wound.       Nonhealing abdominal surgical wound  Neurological:        Neuropathy in feet.   Psychiatric/Behavioral: Negative.    All other  systems reviewed and are negative.  Vital signs:  BP (!) 141/84 (BP Location: Right Arm)   Pulse 90   Temp 97.8 F (36.6 C) (Oral)   Resp 18   SpO2 94%  Exam:  Physical Exam Vitals reviewed.  Constitutional:      Appearance: He is obese.  Cardiovascular:     Rate and Rhythm: Normal rate and regular rhythm.  Pulmonary:     Breath sounds: Wheezing present.  Abdominal:     Hernia: A hernia (abdominal hernia) is present.  Skin:    Findings: Erythema, lesion and rash present.     Comments: Peristomal breakdown  Neurological:     Mental Status: He is alert and oriented to person, place, and time. Mental status is at baseline.  Psychiatric:        Mood and Affect: Mood normal.        Behavior: Behavior normal.     Stoma type/location:  RLQ ileostomy, flush Stomal assessment/size:  1  Peristomal assessment:  irritant dermatitis circumferentially from pouch leak.  Treatment options for stomal/peristomal skin: stoma powder skin prep, barrier ring  SKIN TAC adhesive Output: liquid green stool Ostomy pouching: 1pc. Flat with barrier ring  Education provided:  pouch change performed  does not want to try convex pouching    Impression/dx  Irritant contact dermatitis Nonhealing wound Abdominal hernia Discussion  Wound care performed.  Periwound skin is improved with VASHE cleanser and minimal tape usage.  Aquacel to open wounds.  Cover with gauze and ABD pad/minimal tape.  CHange twice weekly  See back as needed.  CHange pouch if burning, itching develops.     Visit time: 55 minutes.   Darice Cooley FNP-BC

## 2023-06-27 ENCOUNTER — Other Ambulatory Visit: Payer: Self-pay

## 2023-06-27 ENCOUNTER — Other Ambulatory Visit (HOSPITAL_COMMUNITY): Payer: Self-pay

## 2023-06-27 ENCOUNTER — Other Ambulatory Visit: Payer: Self-pay | Admitting: Nurse Practitioner

## 2023-06-28 ENCOUNTER — Other Ambulatory Visit: Payer: Self-pay

## 2023-06-28 ENCOUNTER — Other Ambulatory Visit (HOSPITAL_COMMUNITY): Payer: Self-pay

## 2023-06-28 MED ORDER — CETIRIZINE HCL 10 MG PO TABS
10.0000 mg | ORAL_TABLET | Freq: Every day | ORAL | 1 refills | Status: DC
Start: 2023-06-28 — End: 2024-01-01
  Filled 2023-06-28: qty 90, 90d supply, fill #0
  Filled 2023-10-08: qty 34, 34d supply, fill #1
  Filled 2023-11-13: qty 34, 34d supply, fill #2
  Filled 2023-12-21: qty 22, 22d supply, fill #3

## 2023-06-29 DIAGNOSIS — K469 Unspecified abdominal hernia without obstruction or gangrene: Secondary | ICD-10-CM | POA: Diagnosis not present

## 2023-06-29 DIAGNOSIS — T8189XA Other complications of procedures, not elsewhere classified, initial encounter: Secondary | ICD-10-CM | POA: Diagnosis not present

## 2023-06-29 DIAGNOSIS — Z7689 Persons encountering health services in other specified circumstances: Secondary | ICD-10-CM | POA: Diagnosis not present

## 2023-07-03 ENCOUNTER — Ambulatory Visit (INDEPENDENT_AMBULATORY_CARE_PROVIDER_SITE_OTHER): Payer: Medicaid Other

## 2023-07-03 DIAGNOSIS — I442 Atrioventricular block, complete: Secondary | ICD-10-CM | POA: Diagnosis not present

## 2023-07-04 ENCOUNTER — Ambulatory Visit (HOSPITAL_COMMUNITY): Payer: Medicaid Other | Admitting: Nurse Practitioner

## 2023-07-04 LAB — CUP PACEART REMOTE DEVICE CHECK
Battery Remaining Longevity: 93 mo
Battery Voltage: 3.01 V
Brady Statistic AS VP Percent: 98.99 %
Brady Statistic AS VS Percent: 0.09 %
Brady Statistic RV Percent Paced: 99.26 %
Date Time Interrogation Session: 20250120163756
Implantable Pulse Generator Implant Date: 20220721
Lead Channel Impedance Value: 580 Ohm
Lead Channel Pacing Threshold Amplitude: 0.25 V
Lead Channel Pacing Threshold Pulse Width: 0.24 ms
Lead Channel Sensing Intrinsic Amplitude: 16.538 mV
Lead Channel Setting Pacing Amplitude: 0.875
Lead Channel Setting Pacing Pulse Width: 0.24 ms
Lead Channel Setting Sensing Sensitivity: 2 mV

## 2023-07-05 ENCOUNTER — Encounter: Payer: Self-pay | Admitting: Cardiology

## 2023-07-07 ENCOUNTER — Ambulatory Visit (HOSPITAL_COMMUNITY)
Admission: RE | Admit: 2023-07-07 | Discharge: 2023-07-07 | Disposition: A | Discharge status: Home / Self Care | Payer: Medicaid Other | Source: Ambulatory Visit | Attending: Nurse Practitioner | Admitting: Nurse Practitioner

## 2023-07-07 ENCOUNTER — Other Ambulatory Visit (HOSPITAL_COMMUNITY): Payer: Self-pay

## 2023-07-07 DIAGNOSIS — L24B3 Irritant contact dermatitis related to fecal or urinary stoma or fistula: Secondary | ICD-10-CM

## 2023-07-07 DIAGNOSIS — K469 Unspecified abdominal hernia without obstruction or gangrene: Secondary | ICD-10-CM | POA: Diagnosis not present

## 2023-07-07 DIAGNOSIS — Z432 Encounter for attention to ileostomy: Secondary | ICD-10-CM | POA: Insufficient documentation

## 2023-07-07 DIAGNOSIS — K4020 Bilateral inguinal hernia, without obstruction or gangrene, not specified as recurrent: Secondary | ICD-10-CM | POA: Diagnosis not present

## 2023-07-07 DIAGNOSIS — Z7689 Persons encountering health services in other specified circumstances: Secondary | ICD-10-CM | POA: Diagnosis not present

## 2023-07-07 DIAGNOSIS — X58XXXA Exposure to other specified factors, initial encounter: Secondary | ICD-10-CM | POA: Diagnosis not present

## 2023-07-07 DIAGNOSIS — L259 Unspecified contact dermatitis, unspecified cause: Secondary | ICD-10-CM | POA: Diagnosis not present

## 2023-07-07 DIAGNOSIS — T8189XA Other complications of procedures, not elsewhere classified, initial encounter: Secondary | ICD-10-CM | POA: Insufficient documentation

## 2023-07-07 NOTE — Progress Notes (Signed)
[facility]   Reason for visit:  RLQ ileostomy, nonhealing surgical wound Abdominal wound HPI:  Perforated diverticulum with end ileostomy  Family History  Problem Relation Age of Onset   COPD Mother    Prostate cancer Father    Colon cancer Neg Hx    Rectal cancer Neg Hx    Stomach cancer Neg Hx    Esophageal cancer Neg Hx    Allergies  Allergen Reactions   Lisinopril     Other reaction(s): renal effects   Codeine Other (See Comments) and Hives    unknown Other reaction(s): Other (See Comments) unknown unknown   Current Outpatient Medications  Medication Sig Dispense Refill Last Dose/Taking   acetaminophen (ACETAMINOPHEN 8 HOUR) 650 MG CR tablet Take 1 tablet (650 mg total) by mouth every 8 (eight) hours as needed for pain 90 tablet 1    amLODipine (NORVASC) 10 MG tablet Take 1 tablet (10 mg total) by mouth daily. 90 tablet 0    ascorbic acid (VITAMIN C) 500 MG tablet Take 1 tablet (500 mg total) by mouth daily. 30 tablet 1    atorvastatin (LIPITOR) 10 MG tablet Take 1 tablet (10 mg total) by mouth daily. 90 tablet 0    cetirizine (ZYRTEC) 10 MG tablet Take 1 tablet (10 mg total) by mouth daily. 90 tablet 1    dapagliflozin propanediol (FARXIGA) 10 MG TABS tablet Take 1 tablet (10 mg total) by mouth daily before breakfast. 90 tablet 2    fluticasone (FLOVENT HFA) 110 MCG/ACT inhaler Inhale 2 puffs into the lungs 2 (two) times daily. 1 each 2    folic acid (FOLVITE) 1 MG tablet Take 1 tablet (1 mg total) by mouth daily. 90 tablet 0    gabapentin (NEURONTIN) 300 MG capsule Take 1 capsule (300 mg total) by mouth 2 (two) times daily. 90 capsule 3    hydrOXYzine (VISTARIL) 50 MG capsule Take 1 capsule (50 mg total) by mouth at bedtime as needed. 30 capsule 2    losartan (COZAAR) 25 MG tablet Take 1 tablet (25 mg total) by mouth daily. 30 tablet 5    metFORMIN (GLUCOPHAGE) 500 MG tablet Take 2 tablets (1,000 mg total) by mouth daily with breakfast AND 1 tablet (500 mg  total) daily with supper. 90 tablet 3    metoprolol succinate (TOPROL-XL) 25 MG 24 hr tablet Take 1 tablet (25 mg total) by mouth daily. 90 tablet 3    Multiple Vitamin (MULTIVITAMIN WITH MINERALS) TABS tablet Take 1 tablet by mouth every other day.       omega-3 acid ethyl esters (LOVAZA) 1 g capsule Take 1 capsule (1 g total) by mouth daily. 90 capsule 3    thiamine (VITAMIN B1) 100 MG tablet Take 1 tablet (100 mg total) by mouth daily. 90 tablet 0    torsemide (DEMADEX) 20 MG tablet Take 1 tablet (20 mg total) by mouth daily. 30 tablet 1    torsemide (DEMADEX) 20 MG tablet Take 1 tablet (20 mg total) by mouth daily. 30 tablet 2    triamcinolone cream (KENALOG) 0.5 % Apply topically to legs twice daily for up to 2 weeks as needed for eczema 30 g 1    triamcinolone ointment (KENALOG) 0.1 % Apply 1 Application topically 2 (two) times daily. To affected area on abdomen 75 g 0    No current facility-administered medications for this encounter.   ROS  Review of Systems  Constitutional:  Positive for fatigue.  Eyes:  Negative for photophobia.  Respiratory:  Positive for cough and shortness of breath.   Cardiovascular:        Pacemaker  Gastrointestinal:        RLQ ileostomy Abdominal hernia   Musculoskeletal:  Positive for gait problem.  Psychiatric/Behavioral: Negative.     Vital signs:  BP (!) 157/79 (BP Location: Right Arm)   Pulse 89   Temp 97.9 F (36.6 C) (Oral)   Resp 20   SpO2 99%  Exam:  Physical Exam Vitals reviewed.  Constitutional:      Appearance: He is obese.  HENT:     Mouth/Throat:     Mouth: Mucous membranes are moist.  Abdominal:     Hernia: A hernia is present.     Comments: RLQ ileostomy  Musculoskeletal:     Comments: Uses wheelchair in hospital.  Shortness of breath with exertion  Skin:    General: Skin is warm and dry.     Findings: Erythema, lesion and rash present.  Neurological:     Mental Status: He is alert and oriented to person, place, and  time. Mental status is at baseline.  Psychiatric:        Mood and Affect: Mood normal.        Behavior: Behavior normal.     Stoma type/location:  RLQ ileostomy Stomal assessment/size:  1" flush and productive liquid brown stool Peristomal assessment:  erythema, tenderness  Treatment options for stomal/peristomal skin: barrier ring, 1 piece flat Output: soft brown stool Ostomy pouching: 1pc. flat Education provided:  Wound care provided with VASHE wound cleanser, aquacell and ABD pads/tape    Impression/dx  Ileostomy Contact dermatitis Abdominal hernia Nonhealing surgical wound Discussion  See back as needed.  COntinue to shower prior to performing wound care.  Cleanse  abdomen with VASHE  Plan  Ostomy and wound care twice weekly    Visit time: 45 minutes.   Mike Gip FNP-BC

## 2023-07-11 ENCOUNTER — Ambulatory Visit (HOSPITAL_COMMUNITY): Payer: Medicaid Other | Admitting: Nurse Practitioner

## 2023-07-12 ENCOUNTER — Other Ambulatory Visit: Payer: Self-pay | Admitting: *Deleted

## 2023-07-12 NOTE — Patient Outreach (Signed)
Medicaid Managed Care   Nurse Care Manager Note  07/12/2023 Name:  Nyal Schachter MRN:  [id number] DOB:  26-May-1961  Timothy Trudell is an 63 y.o. year old male who is a primary patient of [name], NP.  The [facility] Managed Care Coordination team was consulted for assistance with:    Incisional Hernia  Mr. Lambertson was given information about Medicaid Managed Care Coordination team services today. [name] Patient agreed to services and verbal consent obtained.  Engaged with patient by telephone for follow up visit in response to provider referral for case management and/or care coordination services.   Patient is participating in a Managed Medicaid Plan:  Yes  Assessments/Interventions:  Review of past medical history, allergies, medications, health status, including review of consultants reports, laboratory and other test data, was performed as part of comprehensive evaluation and provision of chronic care management services.  SDOH (Social Drivers of Health) assessments and interventions performed: SDOH Interventions    Flowsheet Row Patient Outreach Telephone from 04/18/2023 in [place] POPULATION HEALTH DEPARTMENT Patient Outreach Telephone from 04/12/2023 in [place] POPULATION HEALTH DEPARTMENT  SDOH Interventions    Food Insecurity Interventions -- Other (Comment)  [BSW referral]  Housing Interventions -- Other (Comment)  [BSW referral]  Transportation Interventions -- Payor Benefit  Utilities Interventions -- Intervention Not Indicated  Depression Interventions/Treatment  --  [Pt will consider therapy] --  Stress Interventions Provide Counseling  [Patient reports that he does have stress but does not feel like he wants to start treatment at this time or make that committment to therapy just yet] --       Care Plan  Allergies  Allergen Reactions   Lisinopril     Other reaction(s): renal effects   Codeine Other (See Comments) and Hives    unknown Other  reaction(s): Other (See Comments) unknown unknown    Medications Reviewed Today     Reviewed by Heidi Dach, RN (Registered Nurse) on 07/12/23 at 1410  Med List Status: <None>   Medication Order Taking? Sig Documenting Provider Last Dose Status Informant  acetaminophen (ACETAMINOPHEN 8 HOUR) 650 MG CR tablet [id number] Yes Take 1 tablet (650 mg total) by mouth every 8 (eight) hours as needed for pain  Taking Active   amLODipine (NORVASC) 10 MG tablet [id number] Yes Take 1 tablet (10 mg total) by mouth daily. [name], NP Taking Active   ascorbic acid (VITAMIN C) 500 MG tablet [id number] Yes Take 1 tablet (500 mg total) by mouth daily.  Taking Active   atorvastatin (LIPITOR) 10 MG tablet [id number] Yes Take 1 tablet (10 mg total) by mouth daily. [name], NP Taking Active   cetirizine (ZYRTEC) 10 MG tablet [id number] Yes Take 1 tablet (10 mg total) by mouth daily. [name], NP Taking Active   dapagliflozin propanediol (FARXIGA) 10 MG TABS tablet [id number] Yes Take 1 tablet (10 mg total) by mouth daily before breakfast. [name], NP Taking Active   fluticasone (FLOVENT HFA) 110 MCG/ACT inhaler [id number]  Inhale 2 puffs into the lungs 2 (two) times daily. Barnetta Chapel, MD  Active Self, Pharmacy Records  folic acid (FOLVITE) 1 MG tablet [id number] Yes Take 1 tablet (1 mg total) by mouth daily. [name], NP Taking Active   gabapentin (NEURONTIN) 300 MG capsule [id number] Yes Take 1 capsule (300 mg total) by mouth 2 (two) times daily. Louann Sjogren, DPM Taking Active   hydrOXYzine (VISTARIL) 50 MG capsule [id number] Yes Take 1  capsule (50 mg total) by mouth at bedtime as needed. [name], NP Taking Active     Discontinued 03/15/23 1105 (Change in therapy)   losartan (COZAAR) 25 MG tablet [id number] Yes Take 1 tablet (25 mg total) by mouth daily. Dorthula Nettles, DO Taking Active   metFORMIN (GLUCOPHAGE) 500 MG tablet [id number] Yes Take 2 tablets (1,000 mg total) by  mouth daily with breakfast AND 1 tablet (500 mg total) daily with supper. [name], NP Taking Active   metoprolol succinate (TOPROL-XL) 25 MG 24 hr tablet [id number] Yes Take 1 tablet (25 mg total) by mouth daily. Dorthula Nettles, DO Taking Active   Multiple Vitamin (MULTIVITAMIN WITH MINERALS) TABS tablet [id number] Yes Take 1 tablet by mouth every other day.  [provider] Taking Active Self, Pharmacy Records  omega-3 acid ethyl esters (LOVAZA) 1 g capsule [id number] Yes Take 1 capsule (1 g total) by mouth daily. [name], NP Taking Active   thiamine (VITAMIN B1) 100 MG tablet [id number] Yes Take 1 tablet (100 mg total) by mouth daily.  Taking Active   torsemide (DEMADEX) 20 MG tablet [id number]  Take 1 tablet (20 mg total) by mouth daily. Noralee Stain, DO  Expired 06/12/23 2359   torsemide (DEMADEX) 20 MG tablet [id number] Yes Take 1 tablet (20 mg total) by mouth daily. [name], NP Taking Active   triamcinolone cream (KENALOG) 0.5 % [id number] Yes Apply topically to legs twice daily for up to 2 weeks as needed for eczema  Taking Active   triamcinolone ointment (KENALOG) 0.1 % [id number] Yes Apply 1 Application topically 2 (two) times daily. To affected area on abdomen Gwenith Daily, MD Taking Active             Patient Active Problem List   Diagnosis Date Noted   At risk for falling 03/02/2023   Shortness of breath 03/02/2023   Needs flu shot 03/02/2023   HLD (hyperlipidemia) 02/19/2023   Demand ischemia (HCC) 02/19/2023   COPD with acute exacerbation (HCC) 02/16/2023   Acute exacerbation of CHF (congestive heart failure) (HCC) 02/15/2023   Type 2 diabetes mellitus with diabetic polyneuropathy, without long-term current use of insulin (HCC) 01/20/2023   Chronic combined systolic and diastolic heart failure (HCC) 01/20/2023   Acute on chronic heart failure with preserved ejection fraction (HFpEF) (HCC) 12/23/2022   Encounter for smoking cessation counseling  12/09/2022   Prostate cancer screening 12/09/2022   Alcoholism (HCC) 12/09/2022   Abdominal wall hernia at previous stoma site 12/02/2022   Nonhealing surgical wound, subsequent encounter 11/24/2022   Abdominal hernia without obstruction and without gangrene 11/22/2022   Class 1 obesity with serious comorbidity and body mass index (BMI) of 32.0 to 32.9 in adult 11/04/2022   Insomnia 11/04/2022   Elevated hemoglobin (HCC) 11/04/2022   Smoker 11/04/2022   Irritant contact dermatitis associated with digestive stoma 11/02/2022   Ileostomy prolapse (HCC) 08/18/2022   Ileostomy present (HCC) 08/08/2022   Bilateral inguinal hernia without obstruction or gangrene 08/08/2022   Surgical wound, non healing 04/26/2022   Ileostomy dysfunction (HCC) 04/12/2022   Non-recurrent bilateral inguinal hernia without obstruction or gangrene 03/22/2022   Incisional hernia, without obstruction or gangrene    Nonhealing nonsurgical wound    Ileostomy stenosis (HCC)    Irritant contact dermatitis associated with fecal stoma    Ileostomy care [facility])    Pacemaker    AV block, 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   Chronic abdominal wound infection, subsequent encounter 12/26/2020  Alcohol dependence with uncomplicated withdrawal (HCC) 12/26/2020   Tobacco dependence 12/26/2020   Essential hypertension 12/26/2020   Intra-abdominal abscess (HCC) 08/03/2019   Type 2 diabetes mellitus with hyperlipidemia (HCC) 08/03/2019   History of DVT (deep vein thrombosis) 08/03/2019    Conditions to be addressed/monitored per PCP order:   Incisional Hernia  Care Plan : RN Care Manager Plan of Care  Updates made by Heidi Dach, RN since 07/12/2023 12:00 AM     Problem: Health Management needs related to Incisional Hernia      Long-Range Goal: Development of Plan of Care to address Health Management needs related to Incisional Hernia   Start Date: 04/12/2023  Expected End Date: 07/11/2023  Note:   Current Barriers:   Knowledge Deficits related to plan of care for management of Incisional Hernia Mr. Bunton missed a dental visit today due to issues with transportation. He is waiting on Cardiology and Pulmonolgy before follow up with Dr. Jimmie Molly. Mr. Pieper would like to apply for disability benefits.  RNCM Clinical Goal(s):  Patient will verbalize understanding of plan for management of Incisional Hernia as evidenced by Patient reports take all medications exactly as prescribed and will call provider for medication related questions as evidenced by patient reports attend all scheduled medical appointments:  07/14/23 with Wound Clinic, 07/17/23 with Cardiology and 08/07/23 with Pulmonology as evidenced by Provider documentation in EMR continue to work with RN Care Manager to address care management and care coordination needs related to  Incisional Hernia as evidenced by adherence to CM Team Scheduled appointments work with Child psychotherapist to address  related to the management of Financial constraints related to affording mortgage and utilities, Limited access to food, and Mental Health Concerns  related to the management of Incisional Hernia as evidenced by review of EMR and patient or Child psychotherapist report through collaboration with Medical illustrator, provider, and care team.   Interventions: Evaluation of current treatment plan related to  self management and patient's adherence to plan as established by provider   Incisional Hernia  (Status:  Goal on track:  Yes.)  Long Term Goal Evaluation of current treatment plan related to  Incisional Hernia ,  self-management and patient's adherence to plan as established by provider. Discussed plans with patient for ongoing care management follow up and provided patient with direct contact information for care management team Assessed social determinant of health barriers Provided patient with education on Health Maintenance-plans to get RSV and shingles at the  pharmacy Provided education on quitting alcohol-provided encouragement Advised patient to schedule follow up with Cardiology-scheduled 07/17/23 Reviewed and discusses provider notes  Discussed scheduling eye exam-will schedule Assisted with contacting Social Security to request forms-on hold for >59min,  Provided patient with address and phone number to [facility], advised to go in person or call and schedule an appointment Collaborated with BSW   Patient Goals/Self-Care Activities: Take all medications as prescribed Attend all scheduled provider appointments Call provider office for new concerns or questions  Work with the social worker to address care coordination needs and will continue to work with the clinical team to address health care and disease management related needs  Follow Up Plan:  Telephone follow up appointment with care management team member scheduled for:  08/14/22 at 2:30pm      Follow Up:  Patient agrees to Care Plan and Follow-up.  Plan: The Managed Medicaid care management team will reach out to the patient again over the next 30 days.  Date/time of  next scheduled RN care management/care coordination outreach:  08/14/23 at 2:30pm  [name] RN, BSN [place]  [facility] RN Care Manager [phone]

## 2023-07-12 NOTE — Patient Instructions (Signed)
Visit Information  Mr. Hanawalt was given information about Medicaid Managed Care team care coordination services as a part of their [facility] Medicaid benefit. [name] verbally consented to engagement with the [facility] Managed Care team.   If you are experiencing a medical emergency, please call 911 or report to your local emergency department or urgent care.   If you have a non-emergency medical problem during routine business hours, please contact your provider's office and ask to speak with a nurse.   For questions related to your [facility] health plan, please call: [phone] or go here:[email or web address]  If you would like to schedule transportation through your [facility] plan, please call the following number at least 2 days in advance of your appointment: [phone].   You can also use the MTM portal or MTM mobile app to manage your rides. Reimbursement for transportation is available through [facility]! For the portal, please go to [email or web address]  Call the [facility] Crisis Line at 6400480091, at any time, 24 hours a day, 7 days a week. If you are in danger or need immediate medical attention call 911.  If you would like help to quit smoking, call 1-800-QUIT-NOW (3151347222) OR Espaol: 1-855-Djelo-Ya (4-132-440-1027) o para ms informacin haga clic aqu or Text READY to 253-664 to register via text  Mr. Broeker,   Please see education materials related to alcohol misuse provided by MyChart link.  Patient verbalizes understanding of instructions and care plan provided today and agrees to view in MyChart. Active MyChart status and patient understanding of how to access instructions and care plan via MyChart confirmed with patient.     Telephone follow up appointment with Managed Medicaid care management team member scheduled for:08/14/23 at 2:30pm  [name] RN, BSN   [facility] RN  Care Manager [phone]   Following is a copy of your plan of care:  Care Plan : RN Care Manager Plan of Care  Updates made by Heidi Dach, RN since 07/12/2023 12:00 AM     Problem: Health Management needs related to Incisional Hernia      Long-Range Goal: Development of Plan of Care to address Health Management needs related to Incisional Hernia   Start Date: 04/12/2023  Expected End Date: 07/11/2023  Note:   Current Barriers:  Knowledge Deficits related to plan of care for management of Incisional Hernia Mr. Novelo missed a dental visit today due to issues with transportation. He is waiting on Cardiology and Pulmonolgy before follow up with Dr. Jimmie Molly. Mr. Banghart would like to apply for disability benefits.  RNCM Clinical Goal(s):  Patient will verbalize understanding of plan for management of Incisional Hernia as evidenced by Patient reports take all medications exactly as prescribed and will call provider for medication related questions as evidenced by patient reports attend all scheduled medical appointments:  07/14/23 with Wound Clinic, 07/17/23 with Cardiology and 08/07/23 with Pulmonology as evidenced by Provider documentation in EMR continue to work with RN Care Manager to address care management and care coordination needs related to  Incisional Hernia as evidenced by adherence to CM Team Scheduled appointments work with Child psychotherapist to address  related to the management of Financial constraints related to affording mortgage and utilities, Limited access to food, and Mental Health Concerns  related to the management of Incisional Hernia as evidenced by review of EMR and patient or social worker report through collaboration with Medical illustrator, provider, and care team.   Interventions: Evaluation of current treatment  plan related to  self management and patient's adherence to plan as established by provider   Incisional Hernia  (Status:  Goal on track:  Yes.)  Long  Term Goal Evaluation of current treatment plan related to  Incisional Hernia ,  self-management and patient's adherence to plan as established by provider. Discussed plans with patient for ongoing care management follow up and provided patient with direct contact information for care management team Assessed social determinant of health barriers Provided patient with education on Health Maintenance-plans to get RSV and shingles at the pharmacy Provided education on quitting alcohol-provided encouragement Advised patient to schedule follow up with Cardiology-scheduled 07/17/23 Reviewed and discusses provider notes  Discussed scheduling eye exam-will schedule Assisted with contacting Social Security to request forms-on hold for >24min,  Provided patient with address and phone number to [facility], advised to go in person or call and schedule an appointment Collaborated with BSW   Patient Goals/Self-Care Activities: Take all medications as prescribed Attend all scheduled provider appointments Call provider office for new concerns or questions  Work with the social worker to address care coordination needs and will continue to work with the clinical team to address health care and disease management related needs  Follow Up Plan:  Telephone follow up appointment with care management team member scheduled for:  08/14/22 at 2:30pm

## 2023-07-14 ENCOUNTER — Ambulatory Visit (HOSPITAL_COMMUNITY)
Admission: RE | Admit: 2023-07-14 | Discharge: 2023-07-14 | Disposition: A | Discharge status: Home / Self Care | Payer: Medicaid Other | Source: Ambulatory Visit | Attending: Plastic Surgery | Admitting: Plastic Surgery

## 2023-07-14 DIAGNOSIS — Z7689 Persons encountering health services in other specified circumstances: Secondary | ICD-10-CM | POA: Diagnosis not present

## 2023-07-14 DIAGNOSIS — K435 Parastomal hernia without obstruction or  gangrene: Secondary | ICD-10-CM | POA: Insufficient documentation

## 2023-07-14 DIAGNOSIS — L24B3 Irritant contact dermatitis related to fecal or urinary stoma or fistula: Secondary | ICD-10-CM | POA: Diagnosis not present

## 2023-07-14 DIAGNOSIS — Z432 Encounter for attention to ileostomy: Secondary | ICD-10-CM | POA: Insufficient documentation

## 2023-07-14 DIAGNOSIS — X58XXXA Exposure to other specified factors, initial encounter: Secondary | ICD-10-CM | POA: Insufficient documentation

## 2023-07-14 DIAGNOSIS — T8189XA Other complications of procedures, not elsewhere classified, initial encounter: Secondary | ICD-10-CM | POA: Diagnosis not present

## 2023-07-14 DIAGNOSIS — L259 Unspecified contact dermatitis, unspecified cause: Secondary | ICD-10-CM | POA: Insufficient documentation

## 2023-07-14 DIAGNOSIS — K434 Parastomal hernia with gangrene: Secondary | ICD-10-CM

## 2023-07-14 NOTE — Progress Notes (Incomplete)
 [facility]   Reason for visit:  RLQ ileostomy, nonhealing surgical wound Abdominal wound HPI:  Perforated diverticulum with end ileostomy  Family History  Problem Relation Age of Onset   COPD Mother    Prostate cancer Father    Colon cancer Neg Hx    Rectal cancer Neg Hx    Stomach cancer Neg Hx    Esophageal cancer Neg Hx    Allergies  Allergen Reactions   Lisinopril     Other reaction(s): renal effects   Codeine Other (See Comments) and Hives    unknown Other reaction(s): Other (See Comments) unknown unknown   Current Outpatient Medications  Medication Sig Dispense Refill Last Dose/Taking   acetaminophen (ACETAMINOPHEN 8 HOUR) 650 MG CR tablet Take 1 tablet (650 mg total) by mouth every 8 (eight) hours as needed for pain 90 tablet 1    amLODipine (NORVASC) 10 MG tablet Take 1 tablet (10 mg total) by mouth daily. 90 tablet 0    ascorbic acid (VITAMIN C) 500 MG tablet Take 1 tablet (500 mg total) by mouth daily. 30 tablet 1    atorvastatin (LIPITOR) 10 MG tablet Take 1 tablet (10 mg total) by mouth daily. 90 tablet 0    cetirizine (ZYRTEC) 10 MG tablet Take 1 tablet (10 mg total) by mouth daily. 90 tablet 1    dapagliflozin propanediol (FARXIGA) 10 MG TABS tablet Take 1 tablet (10 mg total) by mouth daily before breakfast. 90 tablet 2    fluticasone (FLOVENT HFA) 110 MCG/ACT inhaler Inhale 2 puffs into the lungs 2 (two) times daily. 1 each 2    folic acid (FOLVITE) 1 MG tablet Take 1 tablet (1 mg total) by mouth daily. 90 tablet 0    gabapentin (NEURONTIN) 300 MG capsule Take 1 capsule (300 mg total) by mouth 2 (two) times daily. 90 capsule 3    hydrOXYzine (VISTARIL) 50 MG capsule Take 1 capsule (50 mg total) by mouth at bedtime as needed. 30 capsule 2    losartan (COZAAR) 25 MG tablet Take 1 tablet (25 mg total) by mouth daily. 30 tablet 5    metFORMIN (GLUCOPHAGE) 500 MG tablet Take 2 tablets (1,000 mg total) by mouth daily with breakfast AND 1 tablet (500 mg  total) daily with supper. 90 tablet 3    metoprolol succinate (TOPROL-XL) 25 MG 24 hr tablet Take 1 tablet (25 mg total) by mouth daily. 90 tablet 3    Multiple Vitamin (MULTIVITAMIN WITH MINERALS) TABS tablet Take 1 tablet by mouth every other day.       omega-3 acid ethyl esters (LOVAZA) 1 g capsule Take 1 capsule (1 g total) by mouth daily. 90 capsule 3    thiamine (VITAMIN B1) 100 MG tablet Take 1 tablet (100 mg total) by mouth daily. 90 tablet 0    torsemide (DEMADEX) 20 MG tablet Take 1 tablet (20 mg total) by mouth daily. 30 tablet 1    torsemide (DEMADEX) 20 MG tablet Take 1 tablet (20 mg total) by mouth daily. 30 tablet 2    triamcinolone cream (KENALOG) 0.5 % Apply topically to legs twice daily for up to 2 weeks as needed for eczema 30 g 1    triamcinolone ointment (KENALOG) 0.1 % Apply 1 Application topically 2 (two) times daily. To affected area on abdomen 75 g 0    No current facility-administered medications for this encounter.   ROS  Review of Systems  Constitutional:  Positive for fatigue.  Eyes:  Negative for photophobia.  Respiratory:  Positive for cough and shortness of breath.   Cardiovascular:        Pacemaker  Gastrointestinal:        RLQ ileostomy Abdominal hernia   Musculoskeletal:  Positive for gait problem.  Psychiatric/Behavioral: Negative.     Vital signs:  BP (!) 157/79 (BP Location: Right Arm)   Pulse 89   Temp 97.9 F (36.6 C) (Oral)   Resp 20   SpO2 99%  Exam:  Physical Exam Vitals reviewed.  Constitutional:      Appearance: He is obese.  HENT:     Mouth/Throat:     Mouth: Mucous membranes are moist.  Abdominal:     Hernia: A hernia is present.     Comments: RLQ ileostomy  Musculoskeletal:     Comments: Uses wheelchair in hospital.  Shortness of breath with exertion  Skin:    General: Skin is warm and dry.     Findings: Erythema, lesion and rash present.  Neurological:     Mental Status: He is alert and oriented to person, place, and  time. Mental status is at baseline.  Psychiatric:        Mood and Affect: Mood normal.        Behavior: Behavior normal.     Stoma type/location:  RLQ ileostomy Stomal assessment/size:  1" flush and productive liquid brown stool Peristomal assessment:  erythema, tenderness  Treatment options for stomal/peristomal skin: barrier ring, 1 piece flat Output: soft brown stool Ostomy pouching: 1pc. flat Education provided:  Wound care provided with VASHE wound cleanser, aquacell and ABD pads/tape    Impression/dx  Ileostomy Contact dermatitis Abdominal hernia Nonhealing surgical wound Discussion  See back as needed.  COntinue to shower prior to performing wound care.  Cleanse  abdomen with VASHE  Plan  Ostomy and wound care twice weekly    Visit time: 45 minutes.   Mike Gip FNP-BC

## 2023-07-15 DIAGNOSIS — Z419 Encounter for procedure for purposes other than remedying health state, unspecified: Secondary | ICD-10-CM | POA: Diagnosis not present

## 2023-07-16 NOTE — Progress Notes (Unsigned)
Cardiology Office Note Date:  07/16/2023  Patient ID:  Luis [name], Park Meo 11-16-1960, MRN [id number] PCP:  [name], NP  Electrophysiologist: Dr. Lalla Brothers Pulmonaology: Dr. Judeth Horn    Chief Complaint:  planned f/u visit  History of Present Illness: Luis [name] is a 63 y.o. male with history of HTN, HLD, diverticulitis (w/perforation s/p colectomy and ileostomy complicated by wound infections for the past 2 years), CHB w/PPM, HFpEF, COPD, DVT/PE (post-op 2021 or so)  He was hospitalized 12/2020 with SOB found in CHB, unfortunately he had been struggling with his abdominal wounds for 2 years. Reported Changes his wounds at home but remained with ongoing drainage of foul smelling purulence, frequent fevers/chills.  Echo 12/26/2020 LVEF 65-70% cMRI 12/28/2020 i.  Normal LV size with mild LV hypertrophy, EF 72%. ii.  Normal RV size with EF 55%.  iii.  Normal ECV percentage is not suggestive of cardiac amyloidosis. iv. LGE noted in the mid-wall basal anteroseptum and mid-wall basal inferolateral wall. This is not a coronary disease pattern, could be consistent with cardiac sarcoidosis in the appropriate clinical situation, also prior myocarditis. Would suggest high resolution CT chest to look for any evidence for pulmonary sarcoidosis. 3. High Res Chest CT 12/29/2020 without evidence of ILD (See result note for full results) He needed pacing support prior to any intervention for his abdominal wound and ultimately underwent Micra AV implant There was discussion though that with sarcoid, there may be need for ICD in the future Recommended out patient PET Discharged 01/01/21  He saw [name], PA-C 03/01/21, was feeling better, reported progress in his wound though not healed, following at [facility], not planned for surgery. He had some ectopy that could have been preventing optimal AS-VP. His Toprol increased.  Troubleshooting was done with industry support  I saw him Dec 2022 The patient  tells me that since the pacemaker his breathing immediately was better and remains improved Past that he feels awful. When asked if he has had any CP or concerns/symptoms he thinks may be his heart he gets very defensive even , say "look at me, I feel terrible, wouldn't you?!" He reports that he has been flayed open and ever since has never felt well again He denies syncope, says a month ago he fell and did not have the strength to  get up, he doesn't know whay Eventually got himself up.  Symptoms/concerns are very difficult to weed out today, he reports he feels terrible, always does and is not new.  He is very emotional about the ongoing wound and inability to heal.  He reports that he has had to cancel a number of appointments with the surgeon as he has ours because he just doesn't feel well and cancels. He has an appt with Dr. [name] (he thinks is the name) surgery next month.He is not actively getting formal wound care, he changes the dressings when needed.  Says his son is very supportive and helpful but live a ways away and is hard for him to be there all of the time He admits to smoking and drinking heavily.  When asked how much he drinks he saw "a lot", is not more specific.  He is not interested in quitting either currently. Pacer functioning appropriately 80% AM/VP EKG was abnormal with marked T changes, unclear symptoms, and discussed with DOD, planned for stress test and labs/lytes He was urged to reduce at least amount he was drinking/smoking, quitting was not on the table and re-establish with his  wound care doctors  He did not get the stress test done  He called 09/30/21, sent a remote with symptoms of waking with the feeling of suffocating his assessment perhaps allergies or anxiety, device transmission noted intact device function, device RN urged he keep his follow up as scheduled with Dr. Lalla Brothers.  ER visit 10/02/21 with bleeding abdominal wound, apparently started after he  pulled off the scab, notes report strong odor of alcohol by triage provider, ultimately left without formal evaluation  He canceled his visit with Dr. Lalla Brothers in May, and no showed to the next. He has cancelled and no showed to a number of scheduled visits with the wound/ostomy clinic as well.  He saw the wound clinic 11/23/21  I saw him 11/19/21 He reports last week on his way here suddenly started to feel poorly, chills, felt ill. Spent the day in bed. The next day he developed blood in his urine thinks he passed a kidney stone and then felt better and has since then. No CP, some SOB that is chronic No fainting or near fainting Of course belly never feels well Seeing wound clinic, says the person he saw last was wonderful and is encouraged that she will get him where he needs to be in hopes of a colostomy reversal Travel to [facility] for PET, MRIs is not an option for him Planned to revisit imaging once [facility] program was up/running Urged to minimize/quit ETOH and smoking  He saw Dr Lalla Brothers 05/25/22, not sleep well, had gained 40lbs, planned for an echo, suspected his fatigue multifactorial   Hospitalized 12/23/22-01/01/23 [facility]NA, acute/chronic CHF (diastolic) Patient was managed with antibiotics, BiPAP due to increasing oxygen requirements, eventually intubated and transferred to ICU on 12/25/2018 for further management of respiratory failure. Hospital stay was also complicated by acute gout that was managed with colchicine and prednisone  Delirium/ETOH withdrawal Wound managed with ostomy/wound team  Hospitalized 02/15/23 - 02/20/23 Progressive SOB, acute CHF COPD exacerbation required up to 15 L high flow oxygen during hospital stay. He diuresed well on IV Lasix >> home back on his torsemide Prednisone taper Wound care followed   I saw him 02/23/23 He feels much better in comparison to pre-hospital, very concerned about the fluid build up and HF diagnosis. He reports that he feels good enough  that he would like to get to walking, or some exercise No CP, palpitations Has some SOB, none at rest, minimal at exertional Intermittent cough No near syncope or syncope He reports a very good appetite, no real dietary restrictions Discussed sodium, he will give it some work Drinks quite a bit of water and soda, mouth feels dry, discussed restriction of fluids and avoiding soda to 2L Discussed daily weights, he is doing this, has been steady 230 at his home scale since home Planned to refer to HF team, given a 3 mo back up visit with me until established with the HF team.  He saw Dr. Gasper Lloyd 03/29/23, doing OK, home ADLS, not much else as far as activity went. Toprol dose reduced (with plans to stop in the future) Volume OL and torsemide pulsed Low suspicion of sarcoid Urged to quit alcohol I don't see plans for f/u  I saw him 05/30/23 Generally "OK" SOB waxes/wanes, he was not convinced the extra diuretic doses helped much if at all, reports he urinates well and frequently. No CP, palpitations He c/w wound care, he remarks she has been a life saver for him! Reports that with dressing changes his  skin bleeds quite a bit, has to hold pressure for several minutes, but has not had to call EMS for bleeding in several months, but has had severe bleeding from his wounds in the past. He denies syncope, near syncope No falls of late but did have a period of time that he was falling quite a bit  Noted AFlutter Tracking in a 3:1 (HR 90's-100) Long d/w the pt, clinical cardiologist in office and eventually Dr. Lalla Brothers, all felt his risk of OAC/bleeding complication was greater (prohibative) then benefit and did not recommend starting a/c. No med changes made Panned for 3-4 week f/u Check rates, consider VVI to avoid RVR/tracking  TODAY All in doing "OK" A lot of personal stressors, trying to get disability, finances are getting very difficult He is hoping to get to proceed with colostomy  reversal and eventual hernia repair. No CP, palpitations Some DOE, at his baseline No rest SOB   Device information Micra AV PPM implanted MDT 12/31/2020  Arrhythmia hx AFlutter Sept 2024 No AAD   Past Medical History:  Diagnosis Date   Acute respiratory failure with hypoxia (HCC) 12/23/2022   Alcohol withdrawal syndrome, with delirium (HCC) 12/27/2022   Diabetes mellitus without complication (HCC)    Diverticulitis    DVT (deep venous thrombosis) (HCC)    x3   Encephalopathy acute 12/28/2022   ETOH abuse    H/O colectomy    Hyperlipidemia    Hypertension    Sepsis associated hypotension (HCC) 08/03/2019   Smoker     Past Surgical History:  Procedure Laterality Date   COLOSTOMY     PACEMAKER LEADLESS INSERTION N/A 12/31/2020   Procedure: PACEMAKER LEADLESS INSERTION;  Surgeon: [name], MD;  Location: [facility] INVASIVE CV LAB;  Service: Cardiovascular;  Laterality: N/A;   TONSILLECTOMY      Current Outpatient Medications  Medication Sig Dispense Refill   acetaminophen (ACETAMINOPHEN 8 HOUR) 650 MG CR tablet Take 1 tablet (650 mg total) by mouth every 8 (eight) hours as needed for pain 90 tablet 1   amLODipine (NORVASC) 10 MG tablet Take 1 tablet (10 mg total) by mouth daily. 90 tablet 0   ascorbic acid (VITAMIN C) 500 MG tablet Take 1 tablet (500 mg total) by mouth daily. 30 tablet 1   atorvastatin (LIPITOR) 10 MG tablet Take 1 tablet (10 mg total) by mouth daily. 90 tablet 0   cetirizine (ZYRTEC) 10 MG tablet Take 1 tablet (10 mg total) by mouth daily. 90 tablet 1   dapagliflozin propanediol (FARXIGA) 10 MG TABS tablet Take 1 tablet (10 mg total) by mouth daily before breakfast. 90 tablet 2   fluticasone (FLOVENT HFA) 110 MCG/ACT inhaler Inhale 2 puffs into the lungs 2 (two) times daily. 1 each 2   folic acid (FOLVITE) 1 MG tablet Take 1 tablet (1 mg total) by mouth daily. 90 tablet 0   gabapentin (NEURONTIN) 300 MG capsule Take 1 capsule (300 mg total) by mouth 2  (two) times daily. 90 capsule 3   hydrOXYzine (VISTARIL) 50 MG capsule Take 1 capsule (50 mg total) by mouth at bedtime as needed. 30 capsule 2   losartan (COZAAR) 25 MG tablet Take 1 tablet (25 mg total) by mouth daily. 30 tablet 5   metFORMIN (GLUCOPHAGE) 500 MG tablet Take 2 tablets (1,000 mg total) by mouth daily with breakfast AND 1 tablet (500 mg total) daily with supper. 90 tablet 3   metoprolol succinate (TOPROL-XL) 25 MG 24 hr tablet Take 1 tablet (  25 mg total) by mouth daily. 90 tablet 3   Multiple Vitamin (MULTIVITAMIN WITH MINERALS) TABS tablet Take 1 tablet by mouth every other day.      omega-3 acid ethyl esters (LOVAZA) 1 g capsule Take 1 capsule (1 g total) by mouth daily. 90 capsule 3   thiamine (VITAMIN B1) 100 MG tablet Take 1 tablet (100 mg total) by mouth daily. 90 tablet 0   torsemide (DEMADEX) 20 MG tablet Take 1 tablet (20 mg total) by mouth daily. 30 tablet 1   torsemide (DEMADEX) 20 MG tablet Take 1 tablet (20 mg total) by mouth daily. 30 tablet 2   triamcinolone cream (KENALOG) 0.5 % Apply topically to legs twice daily for up to 2 weeks as needed for eczema 30 g 1   triamcinolone ointment (KENALOG) 0.1 % Apply 1 Application topically 2 (two) times daily. To affected area on abdomen 75 g 0   No current facility-administered medications for this visit.    Allergies:   Lisinopril and Codeine   Social History:  The patient  reports that he has been smoking cigarettes. He has a 44 pack-year smoking history. He uses smokeless tobacco. He reports current alcohol use. He reports current drug use. Drug: Marijuana.   Family History:  The patient's family history includes COPD in his mother; Prostate cancer in his father.  ROS:  Please see the history of present illness.    All other systems are reviewed and otherwise negative.   PHYSICAL EXAM:  VS:  There were no vitals taken for this visit. BMI: There is no height or weight on file to calculate BMI. Well nourished, well  developed, in no acute distress, chronically ill appearing HEENT: normocephalic, atraumatic Neck: no JVD, carotid bruits or masses Cardiac: RRR; (paced)no significant murmurs, no rubs, or gallops Lungs: CTA b/l today, no crackles, not wheezing Abd:  ostomy R abdomen,large dressing in place, not removed, scaly/erythematous skin changes diffusely around dressing, not otherwise examined MS: no deformity or atrophy Ext: no edema Skin: warm and dry, no rash Neuro:  No gross deficits appreciated Psych: euthymic mood, full affect   EKG:  not done today    Device interrogation done today by industry and reviewed by myself:  Battery has estimated 7.7 years years Electrode measurements are good Remains in AFlutter Escape rate is 34bpm HR histograms largely 90's-100 some 100-110, few towards 120 With no plans for [facility] or rhythm management >> VVIR 60-120 today  12/24/22: TTE 1. Left ventricular ejection fraction, by estimation, is 50 to 55%. The  left ventricle has low normal function. The left ventricle has no regional  wall motion abnormalities. Left ventricular diastolic parameters are  consistent with Grade II diastolic  dysfunction (pseudonormalization).   2. Right ventricular systolic function is normal. The right ventricular  size is normal. Tricuspid regurgitation signal is inadequate for assessing  PA pressure.   3. Left atrial size was mildly dilated.   4. The mitral valve is abnormal. Trivial mitral valve regurgitation. No  evidence of mitral stenosis. Severe mitral annular calcification.   5. The aortic valve was not well visualized. There is moderate  calcification of the aortic valve. Aortic valve regurgitation is not  visualized. No aortic stenosis is present.   6. Aortic dilatation noted. There is borderline dilatation of the  ascending aorta, measuring 39 mm.   Comparison(s): No significant change from prior study.     Recent Labs: 02/15/2023: ALT 33; B Natriuretic  Peptide 136.7 02/16/2023: Magnesium 1.7;  TSH 1.632 02/20/2023: Hemoglobin 13.6; Platelets 213 03/20/2023: BUN 30; Creatinine, Ser 1.35; Potassium 4.3; Sodium 136  11/04/2022: Cholesterol 127; Direct LDL 51.0; HDL 32.50; Total CHOL/HDL Ratio 4; VLDL 74.2 12/26/2022: Triglycerides 522   CrCl cannot be calculated (Patient's most recent lab result is older than the maximum 21 days allowed.).   Wt Readings from Last 3 Encounters:  06/15/23 234 lb (106.1 kg)  05/29/23 240 lb 9.6 oz (109.1 kg)  04/26/23 235 lb (106.6 kg)     Other studies reviewed: Additional studies/records reviewed today include: summarized above  ASSESSMENT AND PLAN:  PPM Intact function No programming changes made Escape rate 34 today 99.2 % RV pacing  HTN Recheck was similar Advised to keep an eye on his BP Was much better his last visit He is having some colostomy pain today (not unusual he says) F/u with his PMD  4. Chronic CHF/HFpEF Recurrent hospitalizations, exacerbations Saw Dr. Sofie Hartigan, low suspicion of sarcoid, regardless would not be a candidate for immunosuppressive tx with his large abd wound, excessive ETOH use/compliance concerns No exam findings of volume OL today   5. AFlutter rate controlled V paced  Discussed last visit, he was very weary of a/c After d/w Dr. Lalla Brothers and one of out clinical cardiologists last visit Felt his risk of complication with a/c was prohibitive, far outweighed benefit Pt aware, he was as well worried about the possibility of blood thinners, more comfortable not pursuing it   Discussed potential colostomy reversal, and perhaps eventual large abdominal hernia repair, Discussed he is not without cardiac risk for surgeries though I did not think there was a cardiac reason that he could not have surgery if they decide it is reasonable to proceed.   Disposition: otherwise, back in 4 mo, sooner if needed      Current medicines are reviewed at length with the patient  today.  The patient did not have any concerns regarding medicines.  Norma Fredrickson, PA-C 07/16/2023 10:04 AM     [facility] [address] [place] [address] [phone] (office)  [phone] (fax)

## 2023-07-17 ENCOUNTER — Other Ambulatory Visit (HOSPITAL_COMMUNITY): Payer: Self-pay

## 2023-07-17 ENCOUNTER — Encounter: Payer: Self-pay | Admitting: Physician Assistant

## 2023-07-17 ENCOUNTER — Ambulatory Visit: Payer: Medicaid Other | Attending: Physician Assistant | Admitting: Physician Assistant

## 2023-07-17 VITALS — BP 150/88 | HR 98 | Ht 73.0 in | Wt 236.0 lb

## 2023-07-17 DIAGNOSIS — I5032 Chronic diastolic (congestive) heart failure: Secondary | ICD-10-CM

## 2023-07-17 DIAGNOSIS — I10 Essential (primary) hypertension: Secondary | ICD-10-CM

## 2023-07-17 DIAGNOSIS — I4892 Unspecified atrial flutter: Secondary | ICD-10-CM | POA: Diagnosis not present

## 2023-07-17 DIAGNOSIS — Z950 Presence of cardiac pacemaker: Secondary | ICD-10-CM | POA: Diagnosis not present

## 2023-07-17 DIAGNOSIS — Z7689 Persons encountering health services in other specified circumstances: Secondary | ICD-10-CM | POA: Diagnosis not present

## 2023-07-17 LAB — CUP PACEART INCLINIC DEVICE CHECK
Date Time Interrogation Session: 20250203121520
Implantable Pulse Generator Implant Date: 20220721

## 2023-07-17 NOTE — Patient Instructions (Addendum)
 Medication Instructions:   Your physician recommends that you continue on your current medications as directed. Please refer to the Current Medication list given to you today.   *If you need a refill on your cardiac medications before your next appointment, please call your pharmacy*   Lab Work: NONE ORDERED  TODAY    If you have labs (blood work) drawn today and your tests are completely normal, you will receive your results only by: MyChart Message (if you have MyChart) OR A paper copy in the mail If you have any lab test that is abnormal or we need to change your treatment, we will call you to review the results.   Testing/Procedures: NONE ORDERED  TODAY    Follow-Up: At [facility], you and your health needs are our priority.  As part of our continuing mission to provide you with exceptional heart care, we have created designated Provider Care Teams.  These Care Teams include your primary Cardiologist (physician) and Advanced Practice Providers (APPs -  Physician Assistants and Nurse Practitioners) who all work together to provide you with the care you need, when you need it.  We recommend signing up for the patient portal called "MyChart".  Sign up information is provided on this After Visit Summary.  MyChart is used to connect with patients for Virtual Visits (Telemedicine).  Patients are able to view lab/test results, encounter notes, upcoming appointments, etc.  Non-urgent messages can be sent to your provider as well.   To learn more about what you can do with MyChart, go to [email or web address].    Your next appointment:   4 month(s) ( CONTACT  CASSIE HALL/ ANGELINE HAMMER FOR EP SCHEDULING ISSUES )   Provider:   You may see [name], MD or one of the following Advanced Practice Providers on your designated Care Team:   Francis Dowse, [place]   Other Instructions

## 2023-07-18 ENCOUNTER — Other Ambulatory Visit (HOSPITAL_COMMUNITY): Payer: Self-pay

## 2023-07-18 ENCOUNTER — Ambulatory Visit (HOSPITAL_COMMUNITY): Payer: Medicaid Other | Admitting: Nurse Practitioner

## 2023-07-18 DIAGNOSIS — Z7689 Persons encountering health services in other specified circumstances: Secondary | ICD-10-CM | POA: Diagnosis not present

## 2023-07-18 MED ORDER — SHINGRIX 50 MCG/0.5ML IM SUSR
INTRAMUSCULAR | 1 refills | Status: DC
  Filled 2023-07-18: qty 1, 60d supply, fill #0
  Filled 2024-04-05: qty 1, 60d supply, fill #1

## 2023-07-18 MED ORDER — AREXVY 120 MCG/0.5ML IM SUSR
INTRAMUSCULAR | 0 refills | Status: DC
  Filled 2023-07-18 (×2): qty 1, 30d supply, fill #0

## 2023-07-22 ENCOUNTER — Encounter: Payer: Self-pay | Admitting: Cardiology

## 2023-07-23 ENCOUNTER — Other Ambulatory Visit: Payer: Self-pay

## 2023-07-25 ENCOUNTER — Ambulatory Visit (HOSPITAL_COMMUNITY): Payer: Medicaid Other | Admitting: Nurse Practitioner

## 2023-07-28 DIAGNOSIS — E1142 Type 2 diabetes mellitus with diabetic polyneuropathy: Secondary | ICD-10-CM | POA: Diagnosis not present

## 2023-07-28 DIAGNOSIS — R600 Localized edema: Secondary | ICD-10-CM | POA: Diagnosis not present

## 2023-07-29 ENCOUNTER — Other Ambulatory Visit (HOSPITAL_COMMUNITY): Payer: Self-pay

## 2023-08-01 ENCOUNTER — Ambulatory Visit (HOSPITAL_COMMUNITY): Payer: Medicaid Other | Admitting: Nurse Practitioner

## 2023-08-03 ENCOUNTER — Ambulatory Visit: Payer: Self-pay | Admitting: Nurse Practitioner

## 2023-08-03 ENCOUNTER — Other Ambulatory Visit (HOSPITAL_COMMUNITY): Payer: Self-pay

## 2023-08-03 NOTE — Telephone Encounter (Addendum)
Chief Complaint: breathing difficulty Symptoms: cough, SOB, dizziness, hoarseness Frequency: 2 weeks Pertinent Negatives: Patient denies CP, fever Disposition: [] ED /[] Urgent Care (no appt availability in office) / [x] Appointment(In office/virtual)/ []  [facility] [facility]/ [] Home Care/ [x] Refused Recommended Disposition /[] [place] [facility]/ []  Follow-up with PCP Additional Notes: Patient calls SOB with activity, productive cough, hoarse voice, dizziness with activity x2 weeks. States he has had pneumonia in the past and this feels very similar. Patient reports mild SOB but does sound SOB during conversation. Per protocol, patient to be evaluated within 4 hours. Patient states he does not have a way to get to the clinic for an appt and is not comfortable with VV. This RN discussed the possibility of eval at ED, patient also declined. Patient is requesting call back from provider. Care advice reviewed, patient verbalized understanding and denies further questions at this time. Alerting PCP for review and follow up. Spoke with Delice Bison at [facility], advised of patient refusal.    Copied from CRM [id number]. Topic: Clinical - Red Word Triage >> Aug 03, 2023 10:59 AM Deaijah H wrote: Red Word that prompted transfer to Nurse Triage: Pneumonia : shortness of breath / would like to know what else could be given Reason for Disposition  [1] MILD difficulty breathing (e.g., minimal/no SOB at rest, SOB with walking, pulse <100) AND [2] NEW-onset or WORSE than normal  Answer Assessment - Initial Assessment Questions 1. RESPIRATORY STATUS: "Describe your breathing?" (e.g., wheezing, shortness of breath, unable to speak, severe coughing)      SOB, severe coughing 2. ONSET: "When did this breathing problem begin?"      2 weeks 3. PATTERN "Does the difficult breathing come and go, or has it been constant since it started?"      Constant 4. SEVERITY: "How bad is your breathing?" (e.g., mild, moderate, severe)     - MILD: No SOB at rest, mild SOB with walking, speaks normally in sentences, can lie down, no retractions, pulse < 100.    - MODERATE: SOB at rest, SOB with minimal exertion and prefers to sit, cannot lie down flat, speaks in phrases, mild retractions, audible wheezing, pulse 100-120.    - SEVERE: Very SOB at rest, speaks in single words, struggling to breathe, sitting hunched forward, retractions, pulse > 120      States mild 5. RECURRENT SYMPTOM: "Have you had difficulty breathing before?" If Yes, ask: "When was the last time?" and "What happened that time?"      Yes, had pneumonia and was hospitalized last year. 6. CARDIAC HISTORY: "Do you have any history of heart disease?" (e.g., heart attack, angina, bypass surgery, angioplasty)      CHF 7. LUNG HISTORY: "Do you have any history of lung disease?"  (e.g., pulmonary embolus, asthma, emphysema)     Denies 8. CAUSE: "What do you think is causing the breathing problem?"      Pneumonia 9. OTHER SYMPTOMS: "Do you have any other symptoms? (e.g., dizziness, runny nose, cough, chest pain, fever)     Productive cough has brown tinge, dizziness with activity, hoarseness 10. O2 SATURATION MONITOR:  "Do you use an oxygen saturation monitor (pulse oximeter) at home?" If Yes, ask: "What is your reading (oxygen level) today?" "What is your usual oxygen saturation reading?" (e.g., 95%)       Unable to monitor  12. TRAVEL: "Have you traveled out of the country in the last month?" (e.g., travel history, exposures)       Denies  Protocols  used: Breathing Difficulty-A-AH

## 2023-08-04 NOTE — Telephone Encounter (Signed)
Made pt aware that per provider advise, she needs an office visit to be evaluated for current symptoms. Pt stated that he probably go to the hospital instead.

## 2023-08-06 ENCOUNTER — Emergency Department (HOSPITAL_COMMUNITY): Payer: Medicaid Other

## 2023-08-06 ENCOUNTER — Inpatient Hospital Stay (HOSPITAL_COMMUNITY)
Admission: EM | Admit: 2023-08-06 | Discharge: 2023-08-14 | DRG: 193 | Disposition: A | Discharge status: Home / Self Care | Payer: Medicaid Other | Attending: Internal Medicine | Admitting: Internal Medicine

## 2023-08-06 ENCOUNTER — Encounter (HOSPITAL_COMMUNITY): Payer: Self-pay | Admitting: *Deleted

## 2023-08-06 ENCOUNTER — Other Ambulatory Visit: Payer: Self-pay

## 2023-08-06 DIAGNOSIS — R0603 Acute respiratory distress: Secondary | ICD-10-CM | POA: Diagnosis not present

## 2023-08-06 DIAGNOSIS — E872 Acidosis, unspecified: Secondary | ICD-10-CM | POA: Diagnosis present

## 2023-08-06 DIAGNOSIS — I2489 Other forms of acute ischemic heart disease: Secondary | ICD-10-CM | POA: Diagnosis present

## 2023-08-06 DIAGNOSIS — E785 Hyperlipidemia, unspecified: Secondary | ICD-10-CM | POA: Diagnosis present

## 2023-08-06 DIAGNOSIS — R0902 Hypoxemia: Secondary | ICD-10-CM | POA: Diagnosis not present

## 2023-08-06 DIAGNOSIS — J449 Chronic obstructive pulmonary disease, unspecified: Secondary | ICD-10-CM | POA: Diagnosis not present

## 2023-08-06 DIAGNOSIS — N179 Acute kidney failure, unspecified: Secondary | ICD-10-CM | POA: Diagnosis present

## 2023-08-06 DIAGNOSIS — Z6831 Body mass index (BMI) 31.0-31.9, adult: Secondary | ICD-10-CM

## 2023-08-06 DIAGNOSIS — Z825 Family history of asthma and other chronic lower respiratory diseases: Secondary | ICD-10-CM

## 2023-08-06 DIAGNOSIS — Z8701 Personal history of pneumonia (recurrent): Secondary | ICD-10-CM

## 2023-08-06 DIAGNOSIS — Z7951 Long term (current) use of inhaled steroids: Secondary | ICD-10-CM | POA: Diagnosis not present

## 2023-08-06 DIAGNOSIS — Z933 Colostomy status: Secondary | ICD-10-CM | POA: Diagnosis not present

## 2023-08-06 DIAGNOSIS — Z9049 Acquired absence of other specified parts of digestive tract: Secondary | ICD-10-CM

## 2023-08-06 DIAGNOSIS — E875 Hyperkalemia: Secondary | ICD-10-CM | POA: Diagnosis not present

## 2023-08-06 DIAGNOSIS — J441 Chronic obstructive pulmonary disease with (acute) exacerbation: Secondary | ICD-10-CM | POA: Diagnosis not present

## 2023-08-06 DIAGNOSIS — E876 Hypokalemia: Secondary | ICD-10-CM | POA: Diagnosis not present

## 2023-08-06 DIAGNOSIS — E119 Type 2 diabetes mellitus without complications: Secondary | ICD-10-CM

## 2023-08-06 DIAGNOSIS — I5032 Chronic diastolic (congestive) heart failure: Secondary | ICD-10-CM | POA: Diagnosis not present

## 2023-08-06 DIAGNOSIS — Z888 Allergy status to other drugs, medicaments and biological substances status: Secondary | ICD-10-CM

## 2023-08-06 DIAGNOSIS — I442 Atrioventricular block, complete: Secondary | ICD-10-CM | POA: Diagnosis present

## 2023-08-06 DIAGNOSIS — E871 Hypo-osmolality and hyponatremia: Secondary | ICD-10-CM | POA: Diagnosis present

## 2023-08-06 DIAGNOSIS — J9601 Acute respiratory failure with hypoxia: Principal | ICD-10-CM | POA: Diagnosis present

## 2023-08-06 DIAGNOSIS — E1142 Type 2 diabetes mellitus with diabetic polyneuropathy: Secondary | ICD-10-CM | POA: Diagnosis not present

## 2023-08-06 DIAGNOSIS — R918 Other nonspecific abnormal finding of lung field: Secondary | ICD-10-CM | POA: Diagnosis not present

## 2023-08-06 DIAGNOSIS — J189 Pneumonia, unspecified organism: Secondary | ICD-10-CM | POA: Diagnosis not present

## 2023-08-06 DIAGNOSIS — S31109S Unspecified open wound of abdominal wall, unspecified quadrant without penetration into peritoneal cavity, sequela: Secondary | ICD-10-CM

## 2023-08-06 DIAGNOSIS — E782 Mixed hyperlipidemia: Secondary | ICD-10-CM

## 2023-08-06 DIAGNOSIS — Z79899 Other long term (current) drug therapy: Secondary | ICD-10-CM

## 2023-08-06 DIAGNOSIS — F1020 Alcohol dependence, uncomplicated: Secondary | ICD-10-CM | POA: Diagnosis present

## 2023-08-06 DIAGNOSIS — R7989 Other specified abnormal findings of blood chemistry: Secondary | ICD-10-CM | POA: Diagnosis present

## 2023-08-06 DIAGNOSIS — J309 Allergic rhinitis, unspecified: Secondary | ICD-10-CM | POA: Diagnosis present

## 2023-08-06 DIAGNOSIS — Z1152 Encounter for screening for COVID-19: Secondary | ICD-10-CM | POA: Diagnosis not present

## 2023-08-06 DIAGNOSIS — J811 Chronic pulmonary edema: Secondary | ICD-10-CM | POA: Diagnosis not present

## 2023-08-06 DIAGNOSIS — L089 Local infection of the skin and subcutaneous tissue, unspecified: Secondary | ICD-10-CM

## 2023-08-06 DIAGNOSIS — I700 Atherosclerosis of aorta: Secondary | ICD-10-CM | POA: Diagnosis not present

## 2023-08-06 DIAGNOSIS — E1165 Type 2 diabetes mellitus with hyperglycemia: Secondary | ICD-10-CM | POA: Diagnosis present

## 2023-08-06 DIAGNOSIS — E66811 Obesity, class 1: Secondary | ICD-10-CM | POA: Diagnosis present

## 2023-08-06 DIAGNOSIS — R160 Hepatomegaly, not elsewhere classified: Secondary | ICD-10-CM | POA: Diagnosis not present

## 2023-08-06 DIAGNOSIS — Z419 Encounter for procedure for purposes other than remedying health state, unspecified: Secondary | ICD-10-CM | POA: Diagnosis not present

## 2023-08-06 DIAGNOSIS — I110 Hypertensive heart disease with heart failure: Secondary | ICD-10-CM | POA: Diagnosis present

## 2023-08-06 DIAGNOSIS — F17210 Nicotine dependence, cigarettes, uncomplicated: Secondary | ICD-10-CM | POA: Diagnosis present

## 2023-08-06 DIAGNOSIS — J168 Pneumonia due to other specified infectious organisms: Secondary | ICD-10-CM | POA: Diagnosis not present

## 2023-08-06 DIAGNOSIS — J90 Pleural effusion, not elsewhere classified: Secondary | ICD-10-CM | POA: Diagnosis not present

## 2023-08-06 DIAGNOSIS — T380X5A Adverse effect of glucocorticoids and synthetic analogues, initial encounter: Secondary | ICD-10-CM | POA: Diagnosis not present

## 2023-08-06 DIAGNOSIS — I10 Essential (primary) hypertension: Secondary | ICD-10-CM | POA: Diagnosis not present

## 2023-08-06 DIAGNOSIS — R109 Unspecified abdominal pain: Secondary | ICD-10-CM | POA: Diagnosis not present

## 2023-08-06 DIAGNOSIS — K7460 Unspecified cirrhosis of liver: Secondary | ICD-10-CM | POA: Diagnosis not present

## 2023-08-06 DIAGNOSIS — R0602 Shortness of breath: Secondary | ICD-10-CM | POA: Diagnosis not present

## 2023-08-06 DIAGNOSIS — Z86718 Personal history of other venous thrombosis and embolism: Secondary | ICD-10-CM

## 2023-08-06 DIAGNOSIS — I2510 Atherosclerotic heart disease of native coronary artery without angina pectoris: Secondary | ICD-10-CM | POA: Diagnosis present

## 2023-08-06 DIAGNOSIS — R778 Other specified abnormalities of plasma proteins: Secondary | ICD-10-CM | POA: Diagnosis not present

## 2023-08-06 DIAGNOSIS — J440 Chronic obstructive pulmonary disease with acute lower respiratory infection: Secondary | ICD-10-CM | POA: Diagnosis present

## 2023-08-06 DIAGNOSIS — Z8619 Personal history of other infectious and parasitic diseases: Secondary | ICD-10-CM

## 2023-08-06 DIAGNOSIS — J301 Allergic rhinitis due to pollen: Secondary | ICD-10-CM | POA: Diagnosis not present

## 2023-08-06 DIAGNOSIS — Z950 Presence of cardiac pacemaker: Secondary | ICD-10-CM

## 2023-08-06 DIAGNOSIS — Z885 Allergy status to narcotic agent status: Secondary | ICD-10-CM

## 2023-08-06 DIAGNOSIS — Z7984 Long term (current) use of oral hypoglycemic drugs: Secondary | ICD-10-CM

## 2023-08-06 DIAGNOSIS — F40240 Claustrophobia: Secondary | ICD-10-CM | POA: Diagnosis present

## 2023-08-06 DIAGNOSIS — K760 Fatty (change of) liver, not elsewhere classified: Secondary | ICD-10-CM | POA: Diagnosis not present

## 2023-08-06 DIAGNOSIS — I5033 Acute on chronic diastolic (congestive) heart failure: Secondary | ICD-10-CM | POA: Diagnosis present

## 2023-08-06 DIAGNOSIS — R0989 Other specified symptoms and signs involving the circulatory and respiratory systems: Secondary | ICD-10-CM | POA: Diagnosis not present

## 2023-08-06 DIAGNOSIS — E669 Obesity, unspecified: Secondary | ICD-10-CM | POA: Diagnosis present

## 2023-08-06 DIAGNOSIS — I517 Cardiomegaly: Secondary | ICD-10-CM | POA: Diagnosis not present

## 2023-08-06 DIAGNOSIS — R6510 Systemic inflammatory response syndrome (SIRS) of non-infectious origin without acute organ dysfunction: Secondary | ICD-10-CM

## 2023-08-06 HISTORY — DX: Chronic obstructive pulmonary disease, unspecified: J44.9

## 2023-08-06 LAB — CBC
HCT: 39.1 % (ref 39.0–52.0)
Hemoglobin: 12.7 g/dL — ABNORMAL LOW (ref 13.0–17.0)
MCH: 33.7 pg (ref 26.0–34.0)
MCHC: 32.5 g/dL (ref 30.0–36.0)
MCV: 103.7 fL — ABNORMAL HIGH (ref 80.0–100.0)
Platelets: 211 10*3/uL (ref 150–400)
RBC: 3.77 MIL/uL — ABNORMAL LOW (ref 4.22–5.81)
RDW: 14.9 % (ref 11.5–15.5)
WBC: 10.8 10*3/uL — ABNORMAL HIGH (ref 4.0–10.5)
nRBC: 0 % (ref 0.0–0.2)

## 2023-08-06 LAB — RESP PANEL BY RT-PCR (RSV, FLU A&B, COVID)  RVPGX2
Influenza A by PCR: NEGATIVE
Influenza B by PCR: NEGATIVE
Resp Syncytial Virus by PCR: NEGATIVE
SARS Coronavirus 2 by RT PCR: NEGATIVE

## 2023-08-06 LAB — HEMOGLOBIN A1C
Hgb A1c MFr Bld: 7.3 % — ABNORMAL HIGH (ref 4.8–5.6)
Mean Plasma Glucose: 162.81 mg/dL

## 2023-08-06 LAB — TROPONIN I (HIGH SENSITIVITY)
Troponin I (High Sensitivity): 33 ng/L — ABNORMAL HIGH (ref <18)
Troponin I (High Sensitivity): 28 ng/L — ABNORMAL HIGH (ref <18)

## 2023-08-06 LAB — COMPREHENSIVE METABOLIC PANEL
ALT: 29 U/L (ref 0–44)
AST: 32 U/L (ref 15–41)
Albumin: 3.9 g/dL (ref 3.5–5.0)
Alkaline Phosphatase: 118 U/L (ref 38–126)
Anion gap: 16 — ABNORMAL HIGH (ref 5–15)
BUN: 25 mg/dL — ABNORMAL HIGH (ref 8–23)
CO2: 24 mmol/L (ref 22–32)
Calcium: 9.5 mg/dL (ref 8.9–10.3)
Chloride: 93 mmol/L — ABNORMAL LOW (ref 98–111)
Creatinine, Ser: 1.32 mg/dL — ABNORMAL HIGH (ref 0.61–1.24)
GFR, Estimated: >60 mL/min (ref >60)
Glucose, Bld: 129 mg/dL — ABNORMAL HIGH (ref 70–99)
Potassium: 5 mmol/L (ref 3.5–5.1)
Sodium: 133 mmol/L — ABNORMAL LOW (ref 135–145)
Total Bilirubin: 0.9 mg/dL (ref 0.0–1.2)
Total Protein: 8.2 g/dL — ABNORMAL HIGH (ref 6.5–8.1)

## 2023-08-06 LAB — I-STAT CG4 LACTIC ACID, ED
Lactic Acid, Venous: 3.6 mmol/L (ref 0.5–1.9)
Lactic Acid, Venous: 3.2 mmol/L (ref 0.5–1.9)

## 2023-08-06 LAB — BRAIN NATRIURETIC PEPTIDE: B Natriuretic Peptide: 204.4 pg/mL — ABNORMAL HIGH (ref 0.0–100.0)

## 2023-08-06 LAB — ETHANOL: Alcohol, Ethyl (B): <10 mg/dL (ref <10)

## 2023-08-06 LAB — SALICYLATE LEVEL: Salicylate Lvl: <7 mg/dL — ABNORMAL LOW (ref 7.0–30.0)

## 2023-08-06 LAB — MAGNESIUM: Magnesium: 2.1 mg/dL (ref 1.7–2.4)

## 2023-08-06 MED ORDER — ONDANSETRON HCL 4 MG/2ML IJ SOLN: 4.0000 mg | Freq: Four times a day (QID) | INTRAMUSCULAR | Status: DC | PRN

## 2023-08-06 MED ORDER — METOPROLOL SUCCINATE ER 25 MG PO TB24
25.0000 mg | ORAL_TABLET | Freq: Every day | ORAL | Status: DC
  Administered 2023-08-07 – 2023-08-14 (×8): 25 mg via ORAL
  Filled 2023-08-06 (×8): qty 1

## 2023-08-06 MED ORDER — SODIUM CHLORIDE 0.9 % IV SOLN
500.0000 mg | Freq: Once | INTRAVENOUS | Status: AC
  Administered 2023-08-06: 500 mg via INTRAVENOUS
  Filled 2023-08-06: qty 5

## 2023-08-06 MED ORDER — INSULIN ASPART 100 UNIT/ML IJ SOLN: 0.0000 [IU] | Freq: Three times a day (TID) | INTRAMUSCULAR | Status: DC

## 2023-08-06 MED ORDER — THIAMINE MONONITRATE 100 MG PO TABS
100.0000 mg | ORAL_TABLET | Freq: Every day | ORAL | Status: DC
  Administered 2023-08-07 – 2023-08-14 (×8): 100 mg via ORAL
  Filled 2023-08-06 (×9): qty 1

## 2023-08-06 MED ORDER — ACETAMINOPHEN 325 MG PO TABS
650.0000 mg | ORAL_TABLET | Freq: Four times a day (QID) | ORAL | Status: DC | PRN
  Administered 2023-08-07 – 2023-08-13 (×5): 650 mg via ORAL
  Filled 2023-08-06 (×5): qty 2

## 2023-08-06 MED ORDER — GUAIFENESIN ER 600 MG PO TB12
600.0000 mg | ORAL_TABLET | Freq: Two times a day (BID) | ORAL | Status: DC
  Administered 2023-08-07 – 2023-08-14 (×16): 600 mg via ORAL
  Filled 2023-08-06 (×16): qty 1

## 2023-08-06 MED ORDER — MELATONIN 3 MG PO TABS
3.0000 mg | ORAL_TABLET | Freq: Every evening | ORAL | Status: DC | PRN
  Administered 2023-08-07 – 2023-08-12 (×6): 3 mg via ORAL
  Filled 2023-08-06 (×6): qty 1

## 2023-08-06 MED ORDER — SODIUM CHLORIDE 0.9 % IV SOLN
500.0000 mg | INTRAVENOUS | Status: AC
  Administered 2023-08-07 – 2023-08-10 (×4): 500 mg via INTRAVENOUS
  Filled 2023-08-06 (×4): qty 5

## 2023-08-06 MED ORDER — METHYLPREDNISOLONE SODIUM SUCC 125 MG IJ SOLR
125.0000 mg | Freq: Once | INTRAMUSCULAR | Status: AC
  Administered 2023-08-06: 125 mg via INTRAVENOUS
  Filled 2023-08-06: qty 2

## 2023-08-06 MED ORDER — SODIUM CHLORIDE 0.9 % IV SOLN
1.0000 g | INTRAVENOUS | Status: DC
  Administered 2023-08-07: 1 g via INTRAVENOUS
  Filled 2023-08-06: qty 10

## 2023-08-06 MED ORDER — METHYLPREDNISOLONE SODIUM SUCC 125 MG IJ SOLR
80.0000 mg | Freq: Two times a day (BID) | INTRAMUSCULAR | Status: DC
  Administered 2023-08-07 (×2): 80 mg via INTRAVENOUS
  Filled 2023-08-06 (×2): qty 2

## 2023-08-06 MED ORDER — GABAPENTIN 300 MG PO CAPS
300.0000 mg | ORAL_CAPSULE | Freq: Two times a day (BID) | ORAL | Status: DC
  Administered 2023-08-07 – 2023-08-14 (×16): 300 mg via ORAL
  Filled 2023-08-06 (×16): qty 1

## 2023-08-06 MED ORDER — ACETAMINOPHEN 650 MG RE SUPP: 650.0000 mg | Freq: Four times a day (QID) | RECTAL | Status: DC | PRN

## 2023-08-06 MED ORDER — BENZONATATE 100 MG PO CAPS
200.0000 mg | ORAL_CAPSULE | Freq: Three times a day (TID) | ORAL | Status: DC | PRN
  Administered 2023-08-07 – 2023-08-12 (×8): 200 mg via ORAL
  Filled 2023-08-06 (×8): qty 2

## 2023-08-06 MED ORDER — SODIUM CHLORIDE 0.9 % IV BOLUS
500.0000 mL | Freq: Once | INTRAVENOUS | Status: AC
  Administered 2023-08-06: 500 mL via INTRAVENOUS

## 2023-08-06 MED ORDER — SODIUM CHLORIDE 0.9 % IV SOLN
1.0000 g | Freq: Once | INTRAVENOUS | Status: AC
  Administered 2023-08-06: 1 g via INTRAVENOUS
  Filled 2023-08-06: qty 10

## 2023-08-06 MED ORDER — ATORVASTATIN CALCIUM 10 MG PO TABS
10.0000 mg | ORAL_TABLET | Freq: Every day | ORAL | Status: DC
  Administered 2023-08-07 – 2023-08-14 (×8): 10 mg via ORAL
  Filled 2023-08-06 (×8): qty 1

## 2023-08-06 MED ORDER — IPRATROPIUM-ALBUTEROL 0.5-2.5 (3) MG/3ML IN SOLN
3.0000 mL | Freq: Once | RESPIRATORY_TRACT | Status: AC
  Administered 2023-08-06: 3 mL via RESPIRATORY_TRACT
  Filled 2023-08-06: qty 3

## 2023-08-06 MED ORDER — LORATADINE 10 MG PO TABS
10.0000 mg | ORAL_TABLET | Freq: Every day | ORAL | Status: DC
  Administered 2023-08-07 – 2023-08-14 (×8): 10 mg via ORAL
  Filled 2023-08-06 (×8): qty 1

## 2023-08-06 MED ORDER — IPRATROPIUM-ALBUTEROL 0.5-2.5 (3) MG/3ML IN SOLN
3.0000 mL | Freq: Four times a day (QID) | RESPIRATORY_TRACT | Status: DC
  Administered 2023-08-07 (×4): 3 mL via RESPIRATORY_TRACT
  Filled 2023-08-06 (×4): qty 3

## 2023-08-06 MED ORDER — IOHEXOL 350 MG/ML SOLN
75.0000 mL | Freq: Once | INTRAVENOUS | Status: AC | PRN
  Administered 2023-08-06: 75 mL via INTRAVENOUS

## 2023-08-06 MED ORDER — ALBUTEROL SULFATE (2.5 MG/3ML) 0.083% IN NEBU: 2.5000 mg | INHALATION_SOLUTION | RESPIRATORY_TRACT | Status: DC | PRN

## 2023-08-06 NOTE — H&P (Signed)
 History and Physical      [name] NWG:[id number] DOB: Jul 20, 1960 DOA: 08/06/2023; DOS: 08/06/2023  PCP: [name], NP  Patient coming from: home   I have personally briefly reviewed patient's old medical records in [facility]  Chief Complaint: Shortness of breath  HPI: Luis [name] is a 63 y.o. male with medical history significant for chronic abdominal wall wound, subtotal colectomy status post diverting ileostomy, COPD, chronic diastolic heart failure, type 2 diabetes mellitus, essential hypertension, hyperlipidemia, complete heart block status post pacemaker placement in July 2022, chronically elevated troponin, who is admitted to [facility] on 08/06/2023 with community-acquired pneumonia after presenting from home to [facility] ED complaining of shortness of breath.   The patient reports 1 week of progressive shortness of breath associated new onset productive cough, subjective fever in the absence of chills, rigors, or generalized myalgias. In particular his cough has worsening over the last 2-3 days.  has also noted some associated wheezing over that timeframe.  He reports that his shortness of breath has been associated with orthopnea, but denies any associated PND nor any worsening of peripheral edema.  Denies any recent chest pain, palpitations, diaphoresis, nausea, vomiting.   Denies any known baseline supplemental oxygen requirements.  He was hospitalized with pneumonia in July 2024.  In the setting of a history of COPD, the patient is on scheduled Flovent as an outpatient, but not currently on a long-acting beta-2 agonist nor any long-acting anticholinergic agent.  He conveys that he has a chronic anterior abdominal wound, that is managed routinely via outpatient wound care.  The patient denies any recent worsening of appearance of this wound or any worsening swelling, erythema, or discomfort.  No recent headache, neck stiffness, or any recent dysuria or gross  hematuria.    ED Course:  Vital signs in the ED were notable for the following: Afebrile; heart rates in the 60s; systolic blood pressures in the 120s 130s; respiratory rate 20-22, oxygen saturation initially noted to be 82% on room air, [place] improving into the high 80s on 5 L nasal cannula.  Attempts were made to switch the patient to nonrebreather, although he did not tolerate the mask from a claustrophobia standpoint.  At this time, respiratory therapy is diverting supplemental oxygen delivery to high flow salter.  Labs were notable for the following: CMP notable for the following: Bicarbonate 24, creatinine 1.32 compared to most recent prior creatinine data point 1.351 03/20/2023, glucose 139, liver enzymes were within normal limits.  BNP 204 compared to 136 on 02/15/2023, compared to 1000 428 at time of most recent prior hospitalization on 12/25/2022.  High-sensitivity troponin I initially 33, with repeat value trending down to 28,, compared to most recent prior by 30 12 February 2023.  Initial lactic acid 3.6, with repeat value trending down to 3.2.  CBC notable for white cell count 10,800, hemoglobin 12.7 compared to 13.6 on 02/20/2023, platelet count 211.  Blood cultures x 2 were collected prior to initiation of antibiotics.  COVID, flu Enza, RSV PCR were all negative.  Per my interpretation, EKG in ED demonstrated the following:.  On most recent prior EKG from 05/29/2023, today's EKG shows similar V paced rhythm with heart rate 60, no evidence of interval T wave or ST changes.  Imaging in the ED, per corresponding formal radiology read, was notable for the following: 1 view chest x-ray showed airspace opacities in both lung bases concerning for pneumonia, will demonstrating no evidence of effusion or pneumothorax.  CTA chest with PE protocol showed no evidence of acute pulmonary embolism while showing unchanged bronchial wall thickening consistent with bronchitis, relative to CT chest from 12/23/2022.   Today CT chest also shows bibasilar airspace opacities concerning for pneumonia, will demonstrating no evidence of edema, effusion, or pneumothorax.  While in the ED, the following were administered: Duo nebulizer treatment x 1, Solu-Medrol 125 mg IV x 1, azithromycin, Rocephin, normal saline 500 cc bolus x 2 occurrences.  Subsequently, the patient was admitted for further evaluation management of presenting suspected community-acquired pneumonia complicated by acute COPD exacerbation as well as acute hypoxic respiratory failure.     Review of Systems: As per HPI otherwise 10 point review of systems negative.   Past Medical History:  Diagnosis Date   Acute respiratory failure with hypoxia (HCC) 12/23/2022   Alcohol withdrawal syndrome, with delirium (HCC) 12/27/2022   Diabetes mellitus without complication (HCC)    Diverticulitis    DVT (deep venous thrombosis) (HCC)    x3   Encephalopathy acute 12/28/2022   ETOH abuse    H/O colectomy    Hyperlipidemia    Hypertension    Sepsis associated hypotension (HCC) 08/03/2019   Smoker     Past Surgical History:  Procedure Laterality Date   COLOSTOMY     PACEMAKER LEADLESS INSERTION N/A 12/31/2020   Procedure: PACEMAKER LEADLESS INSERTION;  Surgeon: [name], MD;  Location: [facility] INVASIVE CV LAB;  Service: Cardiovascular;  Laterality: N/A;   TONSILLECTOMY      Social History:  reports that he has been smoking cigarettes. He has a 44 pack-year smoking history. He uses smokeless tobacco. He reports current alcohol use. He reports current drug use. Drug: Marijuana.   Allergies  Allergen Reactions   Lisinopril     Other reaction(s): renal effects   Codeine Other (See Comments) and Hives    unknown Other reaction(s): Other (See Comments) unknown unknown    Family History  Problem Relation Age of Onset   COPD Mother    Prostate cancer Father    Colon cancer Neg Hx    Rectal cancer Neg Hx    Stomach cancer Neg Hx     Esophageal cancer Neg Hx     Family history reviewed and not pertinent    Prior to Admission medications   Medication Sig Start Date End Date Taking? Authorizing Provider  acetaminophen (ACETAMINOPHEN 8 HOUR) 650 MG CR tablet Take 1 tablet (650 mg total) by mouth every 8 (eight) hours as needed for pain 11/10/22     amLODipine (NORVASC) 10 MG tablet Take 1 tablet (10 mg total) by mouth daily. 03/10/23   [name], NP  ascorbic acid (VITAMIN C) 500 MG tablet Take 1 tablet (500 mg total) by mouth daily. 01/01/23     atorvastatin (LIPITOR) 10 MG tablet Take 1 tablet (10 mg total) by mouth daily. 03/10/23   [name], NP  cetirizine (ZYRTEC) 10 MG tablet Take 1 tablet (10 mg total) by mouth daily. 06/28/23   [name], NP  dapagliflozin propanediol (FARXIGA) 10 MG TABS tablet Take 1 tablet (10 mg total) by mouth daily before breakfast. 03/10/23   [name], NP  fluticasone (FLOVENT HFA) 110 MCG/ACT inhaler Inhale 2 puffs into the lungs 2 (two) times daily. 01/01/23 01/01/24  Barnetta Chapel, MD  folic acid (FOLVITE) 1 MG tablet Take 1 tablet (1 mg total) by mouth daily. 05/04/23   [name], NP  gabapentin (NEURONTIN) 300  MG capsule Take 1 capsule (300 mg total) by mouth 2 (two) times daily. 06/20/23   Louann Sjogren, DPM  hydrOXYzine (VISTARIL) 50 MG capsule Take 1 capsule (50 mg total) by mouth at bedtime as needed. 04/20/23   [name], NP  losartan (COZAAR) 25 MG tablet Take 1 tablet (25 mg total) by mouth daily. 03/29/23   Sabharwal, Aditya, DO  metFORMIN (GLUCOPHAGE) 500 MG tablet Take 2 tablets (1,000 mg total) by mouth daily with breakfast AND 1 tablet (500 mg total) daily with supper. 05/04/23   [name], NP  metoprolol succinate (TOPROL-XL) 25 MG 24 hr tablet Take 1 tablet (25 mg total) by mouth daily. 03/29/23   Sabharwal, Eliezer Lofts, DO  Multiple Vitamin (MULTIVITAMIN WITH MINERALS) TABS tablet Take 1 tablet by mouth every other day.     [provider]   omega-3 acid ethyl esters (LOVAZA) 1 g capsule Take 1 capsule (1 g total) by mouth daily. 03/02/23   [name], NP  RSV vaccine recomb adjuvanted (AREXVY) 120 MCG/0.5ML injection Inject into the muscle. 07/18/23   Judyann Munson, MD  thiamine (VITAMIN B1) 100 MG tablet Take 1 tablet (100 mg total) by mouth daily. 11/10/22     torsemide (DEMADEX) 20 MG tablet Take 1 tablet (20 mg total) by mouth daily. 02/20/23 06/12/23  Noralee Stain, DO  torsemide (DEMADEX) 20 MG tablet Take 1 tablet (20 mg total) by mouth daily. 04/28/23   [name], NP  triamcinolone cream (KENALOG) 0.5 % Apply topically to legs twice daily for up to 2 weeks as needed for eczema 12/09/22     triamcinolone ointment (KENALOG) 0.1 % Apply 1 Application topically 2 (two) times daily. To affected area on abdomen 04/06/23   Gwenith Daily, MD  Zoster Vaccine Adjuvanted [facility]) injection Inject into the muscle. 07/18/23   Judyann Munson, MD  icosapent Ethyl (VASCEPA) 1 g capsule Take 2 capsules (2 g total) by mouth 2 (two) times daily. 03/09/23 03/15/23  [name], NP     Objective    Physical Exam: Vitals:   08/06/23 1558 08/06/23 1605 08/06/23 2017  BP:  127/82 136/67  Pulse:  (!) 58 60  Resp:  20 (!) 22  Temp:  98.2 F (36.8 C) 98.8 F (37.1 C)  TempSrc:  Oral Oral  SpO2:  90% 90%  Weight: 107 kg    Height: 6\' 1"  (1.854 m)      General: appears to be stated age; alert, oriented Skin: warm, dry, no rash Head:  AT/[place] Mouth:  Oral mucosa membranes appear moist, normal dentition Neck: supple; trachea midline Heart:  RRR; did not appreciate any M/R/G Lungs: Bibasilar rales with mild bilateral expiratory wheezes noted ;  Abdomen: + BS; soft, ND, dressing of her chronic anterior abdominal wound; right lower quadrant ileostomy noted Vascular: 2+ pedal pulses b/l; 2+ radial pulses b/l Extremities: no peripheral edema, no muscle wasting Neuro: strength and sensation intact in upper and lower extremities  b/l     Labs on Admission: I have personally reviewed following labs and imaging studies  CBC: Recent Labs  Lab 08/06/23 1650  WBC 10.8*  HGB 12.7*  HCT 39.1  MCV 103.7*  PLT 211   Basic Metabolic Panel: Recent Labs  Lab 08/06/23 1650  NA 133*  K 5.0  CL 93*  CO2 24  GLUCOSE 129*  BUN 25*  CREATININE 1.32*  CALCIUM 9.5   GFR: Estimated Creatinine Clearance: 72.5 mL/min (A) (by C-G formula  based on SCr of 1.32 mg/dL (H)). Liver Function Tests: Recent Labs  Lab 08/06/23 1650  AST 32  ALT 29  ALKPHOS 118  BILITOT 0.9  PROT 8.2*  ALBUMIN 3.9   No results for input(s): "LIPASE", "AMYLASE" in the last 168 hours. No results for input(s): "AMMONIA" in the last 168 hours. Coagulation Profile: No results for input(s): "INR", "PROTIME" in the last 168 hours. Cardiac Enzymes: No results for input(s): "CKTOTAL", "CKMB", "CKMBINDEX", "TROPONINI" in the last 168 hours. BNP (last 3 results) No results for input(s): "PROBNP" in the last 8760 hours. HbA1C: No results for input(s): "HGBA1C" in the last 72 hours. CBG: No results for input(s): "GLUCAP" in the last 168 hours. Lipid Profile: No results for input(s): "CHOL", "HDL", "LDLCALC", "TRIG", "CHOLHDL", "LDLDIRECT" in the last 72 hours. Thyroid Function Tests: No results for input(s): "TSH", "T4TOTAL", "FREET4", "T3FREE", "THYROIDAB" in the last 72 hours. Anemia Panel: No results for input(s): "VITAMINB12", "FOLATE", "FERRITIN", "TIBC", "IRON", "RETICCTPCT" in the last 72 hours. Urine analysis:    Component Value Date/Time   COLORURINE YELLOW 08/02/2019 2009   APPEARANCEUR CLEAR 08/02/2019 2009   LABSPEC 1.024 08/02/2019 2009   PHURINE 5.0 08/02/2019 2009   GLUCOSEU NEGATIVE 08/02/2019 2009   HGBUR NEGATIVE 08/02/2019 2009   BILIRUBINUR NEGATIVE 08/02/2019 2009   KETONESUR NEGATIVE 08/02/2019 2009   PROTEINUR NEGATIVE 08/02/2019 2009   NITRITE NEGATIVE 08/02/2019 2009   LEUKOCYTESUR NEGATIVE 08/02/2019 2009     Radiological Exams on Admission: CT ABDOMEN PELVIS W CONTRAST Result Date: 08/06/2023 CLINICAL DATA:  Abdominal pain EXAM: CT ABDOMEN AND PELVIS WITH CONTRAST TECHNIQUE: Multidetector CT imaging of the abdomen and pelvis was performed using the standard protocol following bolus administration of intravenous contrast. RADIATION DOSE REDUCTION: This exam was performed according to the departmental dose-optimization program which includes automated exposure control, adjustment of the mA and/or kV according to patient size and/or use of iterative reconstruction technique. CONTRAST:  75mL OMNIPAQUE IOHEXOL 350 MG/ML SOLN COMPARISON:  04/22/2023 FINDINGS: Lower chest: Bibasilar atelectasis or infiltrates. See chest CT report. Hepatobiliary: Liver is enlarged and low density compatible with fatty infiltration. Nodular contours compatible with cirrhosis. Findings stable since prior study. No focal hepatic abnormality. Gallbladder unremarkable. Pancreas: No focal abnormality or ductal dilatation. Spleen: No focal abnormality.  Normal size. Adrenals/Urinary Tract: No adrenal abnormality. No focal renal abnormality. No stones or hydronephrosis. Urinary bladder is unremarkable. Stomach/Bowel: Prior colectomy. Right lower quadrant ileostomy. Stomach and small bowel decompressed. No bowel obstruction or inflammatory process. Vascular/Lymphatic: Diffuse aortoiliac atherosclerosis. No evidence of aneurysm or adenopathy. Reproductive: No visible focal abnormality. Other: Small amount of perihepatic ascites.  No free air. Musculoskeletal: No acute bony abnormality. IMPRESSION: Changes of fatty liver, hepatomegaly and cirrhosis, stable. Small amount of perihepatic ascites. 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 No bowel obstruction. Aortoiliac atherosclerosis. No acute findings. Electronically Signed   By: Charlett Nose M.D.   On: 08/06/2023 20:23   CT Angio Chest PE W and/or Wo  Contrast Result Date: 08/06/2023 CLINICAL DATA:  Pulmonary embolism (PE) suspected, high prob. Sob, hypoxia, hx of PE not anticoagulated, abd pain, abd wound; EXAM: CT ANGIOGRAPHY CHEST WITH CONTRAST TECHNIQUE: Multidetector CT imaging of the chest was performed using the standard protocol during bolus administration of intravenous contrast. Multiplanar CT image reconstructions and MIPs were obtained to evaluate the vascular anatomy. RADIATION DOSE REDUCTION: This exam was performed according to the departmental dose-optimization program which includes automated exposure control, adjustment of the mA and/or kV according to  patient size and/or use of iterative reconstruction technique. CONTRAST:  75mL OMNIPAQUE IOHEXOL 350 MG/ML SOLN COMPARISON:  12/23/2022 FINDINGS: Cardiovascular: Cardiomegaly. Coronary artery and aortic atherosclerosis. No filling defects in the pulmonary arteries to suggest pulmonary emboli. Mediastinum/Nodes: No mediastinal, hilar, or axillary adenopathy. Trachea and esophagus are unremarkable. Thyroid unremarkable. Lungs/Pleura: Peribronchial thickening diffusely, most pronounced in the lower lobes and similar to prior study. Bibasilar airspace opacities could reflect atelectasis or pneumonia. No effusions. Upper Abdomen: No acute findings Musculoskeletal: Chest wall soft tissues are unremarkable. No acute bony abnormality. Review of the MIP images confirms the above findings. IMPRESSION: No evidence of pulmonary embolus. Cardiomegaly, coronary artery disease. Bronchial wall thickening compatible with bronchitis. This is similar to prior study. Continued bibasilar airspace opacities, atelectasis versus pneumonia. Opacities less pronounced than prior study. Aortic Atherosclerosis (ICD10-I70.0). Electronically Signed   By: Charlett Nose M.D.   On: 08/06/2023 20:20   DG Chest Portable 1 View Result Date: 08/06/2023 CLINICAL DATA:  Shortness of breath and hypoxia EXAM: PORTABLE CHEST 1 VIEW  COMPARISON:  Chest x-ray 02/17/2023. FINDINGS: There some patchy and interstitial opacities in both lung bases. The cardiomediastinal silhouette is stable, the heart is mildly enlarged. There is no pneumothorax or acute fracture. IMPRESSION: Patchy and interstitial opacities in both lung bases, concerning for pneumonia versus edema. Electronically Signed   By: Darliss Cheney M.D.   On: 08/06/2023 17:06      Assessment/Plan   Principal Problem:   CAP (community acquired pneumonia) Active Problems:   Essential hypertension   DM2 (diabetes mellitus, type 2) (HCC)   Chronic abdominal wound infection, sequela   Acute hypoxic respiratory failure (HCC)   Chronic diastolic CHF (congestive heart failure) (HCC)   COPD with acute exacerbation (HCC)   HLD (hyperlipidemia)   Elevated troponin   Lactic acidosis   Allergic rhinitis     #) Community-acquired pneumonia: dx on the basis of give progressive shortness of breath associated new onset productive cough, subjective fever, mildly leukocytosis, with presenting chest x-ray showing bibasilar airspace opacities concerning for pneumonia in addition to CTA chest showing bibasilar airspace opacities concerning for pneumonia.  Of note, in the absence of objective fever or will with cell count greater than 12,000, SIRS criteria are not met for sepsis at this time.  Appears hemodynamically stable, without e/o hypotension. COVID/ RSV/ influenza PCR all negative.     Blood cx's x 2 collected in the ED today followed by initiation of azithromycin and Rocephin, which will be continued as CAP coverage.  No e/o additional underlying infxn at this time, but will also check UA to further assess.  Lactic acid level is elevated, but trending down.  Once again, criteria are not met for sepsis, as described above.   Plan: Follow-up results of blood cx's.   Continue azithromycin, Rocephin. Check Procalcitonin level. As needed acetaminophen for fever.  Repeat CMP, CBC in  the morning.  Check serum magnesium and phosphorus levels.  Prn Tessalon Perles for cough.  Scheduled guaifenesin.  Incentive spirometry, flutter valve.  Check strep pneumonia urine antigen.  Allises.  Repeat lactic acid level in the morning.                  #) Acute hypoxic respiratory failure due to acute COPD exacerbation: in the context of a documented history of COPD, diagnosis of acute exacerbation on the basis of progressive shortness of breath associated with mildly increased work of breathing, wheezing, which appears to be as a consequence of underlying  community-acquired pneumonia, with CTA chest as well as chest x-ray showing evidence of bibasilar airspace opacities consistent with pneumonia, CT chest showed no evidence of pulmonary edema or any evidence of pneumothorax.  Additionally, in the context of no known baseline supplemental oxygen requirements, patient's initial oxygen saturation was noted to be in the low 80s on room air, requiring greater than 5 L to improve oxygen saturations back into the 90s.  This acute hypoxic respiratory failure appears to be multifactorial in etiology, with contributions from both community-acquired pneumonia as well as from resultant acute COPD exacerbation.  In addition to no radiographic evidence to suggest acute decompensated heart failure, BNP appears stable relative to most recent prior value, and is about 1/7 of what it was at the time of most recent previous hospitalization from July 2024.  Overall, clinically, radiographically, per review of laboratory trend, presentation appears less suggestive of acutely decompensated heart failure, will continue to be cautious with IV fluids, closely monitor for any ensuing evidence to suggest developing acute volume overload.  CT chest was also notable for show no evidence of acute pulmonary embolism.  COVID, influenza, RSV PCR are all negative.  Additionally, ACS appears less likely in the absence  of chest pain, with EKG showing no evidence of acute ischemic changes, and with troponin nonelevated relative to baseline.   Patient reports good compliance with outpatient COPD regimen, which consists of Flovent.  It is noted that he is not currently on any long-acting beta-2 agonist or any long-acting anticholinergic agents at home.    Plan: Monitor on telemetry. Solumedrol. Scheduled duonebs q6 hours. Prn albuterol inhaler.  CMP in the morning. Repeat CBC in the morning. Check serum Mg and Phos levels. Will attempt additional chart review to evaluate most recent PFT results.  Further evaluation management of presenting community-acquired pneumonia, including azithromycin, Rocephin.  Flutter valve, incentive spirometry.  Check blood gas. Add-on procalcitonin.  Monitor strict I's and O's and daily weights.                  #) Lactic Acidosis: Presenting lactate 3.6, with repeat value trending down to 3.2 following a total of 1 L of interval IV fluids.  As noted above, SIRS criteria are not met for sepsis at this time.  Rather, suspect a contribution towards elevated lactic acid level from generalized hypoperfusion diminished oxygen delivery capacity resulting from his presenting acute hypoxic respiratory failure, as above.  He has a prior history of alcohol abuse, although he now conveys significant reduction in his alcohol consumption.  Will add on serum ethanol level, with differential also including potential lactic acidosis.  Will also check INR to evaluate hepatic synthetic function.  Given the patient's history of chronic diastolic heart failure, will refrain from aggressive IV fluids, while addressing the main suspected contributing factor leading to the elevated lactate, which was the hypoxia resulting in diminished generalized perfusion.   Plan: CMP/CBC in the AM. Monitor strict I's & O's. Check salicylate level. Check INR.  Add on serum ethanol level.  Further evaluation  management of presenting acute hypoxic respiratory failure, as above.  Check urinalysis.  Check blood gas.  Repeat lactic acid level in the morning.                #) Chronically elevated troponin: Documented history of such, with baseline troponin range in the 30s to 40s dating back to at least July 2022, presenting troponin level consistent with this range and trending down upon repeat, with  most recent troponin level of 28 and appearing to be the lowest high sensitive troponin I data point in approximately 30 months.  As described above, presentation is much less suggestive of ACS.  Plan: Monitor on telemetry.                #) Chronic anterior abdominal wound: Documented history of such, which is managed via outpatient wound care.  Recent exacerbation as such, and today CT abdomen shows no evidence to suggest associated abscess or cellulitis, noting no evidence of associated subcutaneous edema or fluid collection.  Additionally, no evidence of subcutaneous gas.  Plan: I have placed inpatient wound care consult for further management thereof.                   #) Chronic diastolic heart failure: documented history of such, with most recent echocardiogram performed in July 2024, which was notable for LVEF 50 to 55%, no evidence of focal wall motion abnormalities, grade 2 diastolic dysfunction, normal right ventricular systolic function, mildly dilated left atrium and trivial mitral gravitation. No clinical or radiographic evidence to suggest acutely decompensated heart failure at this time, noting CTA chest result, which showed no evidence of pulmonary edema or any evidence of pleural effusion, while BNP is now 1 7 of what was during the July 2024 hospitalization. home diuretic regimen reportedly consists of the following: Torsemide, with patient reporting good associated compliance.  Will proceed with caution regarding any additional IV fluids.  Plan:  monitor strict I's & O's and daily weights. Repeat CMP in AM. Check serum mag level. Will plan to reevaluate volume status in the morning to assist with determination of timing of resumption of home torsemide.                   #) Type 2 Diabetes Mellitus: documented history of such. Home insulin regimen: None. Home oral hypoglycemic agents: Dapagliflozin, metformin. presenting blood sugar: 139. Most recent A1c noted to be 7.0% when checked in September 2024.  Complicated by diabetic peripheral polyneuropathy for which she is on gabapentin at home.  Plan: accuchecks QAC and HS with low dose SSI. hold home oral hypoglycemic agents during this hospitalization.  Add on hemoglobin A1c level.  Resume home gabapentin.                   #) Essential Hypertension: documented h/o such, with outpatient antihypertensive regimen including Norvasc, losartan, metoprolol succinate, torsemide.  SBP's in the ED today: 120s to 130s mmHg. however, in the context of his acute infectious process, will proceed conservatively regarding resumption of outpatient antihypertensive medications.  Plan: Close monitoring of subsequent BP via routine VS. resume home beta-blocker.  Hold home losartan and amlodipine for now.  Will hold next dose of torsemide, with plan to reassess volume status in the morning to assist with determination of appropriate timing of resumption of the latter.                   #) Hyperlipidemia: documented h/o such. On atorvastatin as outpatient.   Plan: continue home statin.                   #) Allergic Rhinitis: documented h/o such, on scheduled Zyrtec as an outpatient, in the absence of any scheduled use of intranasal corticosteroid.   Plan: cont home Zyrtec.                   #) History of chronic  alcohol abuse: Although patient has documentation of a prior history of alcohol abuse, the patient conveys significant  efforts to reduce alcohol consumption now coming that he no longer consumes alcohol on a daily basis, he has a single beer (1 pint), 5 times per week.  The macrocytic nature of his CBC is noted.   Plan: Add on serum ethanol level.  Daily thiamine supplementation, first dose now.    DVT prophylaxis: SCD's   Code Status: Full code Family Communication: none Disposition Plan: Per Rounding Team Consults called: none;  Admission status: Inpatient     I SPENT GREATER THAN 75  MINUTES IN CLINICAL CARE TIME/MEDICAL DECISION-MAKING IN COMPLETING THIS ADMISSION.      Angie Fava DO [facility]  From 7PM - 7AM   08/06/2023, 9:47 PM

## 2023-08-06 NOTE — ED Notes (Signed)
 Called and placed PT on CCMD monitor

## 2023-08-06 NOTE — ED Provider Notes (Signed)
 [place] EMERGENCY DEPARTMENT AT [facility] Provider Note   CSN: [id number] Arrival date & time: 08/06/23  1513    History  Chief Complaint  Patient presents with   Shortness of Breath    Luis [name] is a 63 y.o. male complex medical history here for evaluation of shortness of breath.  Has felt unwell over the last week or 2.  Since objective chills at home. States he has had PNA twice in the last 6 months, hospitalized both times. SOB with laying back. Sits upright to sleep at baseline. No LE swelling. Productive cough of white and yellow. Chills, feeling hot and cold. Sick contact with friend. Hx of DVT, PE post surgery. No chronic anticoagulation. No back pain, abd pain, le pain, weakness. Compliant with home meds, torsemide.  Chronic wound around ostomy bag, unchanged. Chronic skin changes. No blood from ostomy site. (Refuses to remove bandaging on entire abd.) States follows with wound care.  Sees Pulmonology, Next apt Wed.  Hypoxic to 82% on RA on arrival   HPI    Home Medications Prior to Admission medications   Medication Sig Start Date End Date Taking? Authorizing Provider  acetaminophen (ACETAMINOPHEN 8 HOUR) 650 MG CR tablet Take 1 tablet (650 mg total) by mouth every 8 (eight) hours as needed for pain 11/10/22     amLODipine (NORVASC) 10 MG tablet Take 1 tablet (10 mg total) by mouth daily. 03/10/23   [name], NP  ascorbic acid (VITAMIN C) 500 MG tablet Take 1 tablet (500 mg total) by mouth daily. 01/01/23     atorvastatin (LIPITOR) 10 MG tablet Take 1 tablet (10 mg total) by mouth daily. 03/10/23   [name], NP  cetirizine (ZYRTEC) 10 MG tablet Take 1 tablet (10 mg total) by mouth daily. 06/28/23   [name], NP  dapagliflozin propanediol (FARXIGA) 10 MG TABS tablet Take 1 tablet (10 mg total) by mouth daily before breakfast. 03/10/23   [name], NP  fluticasone (FLOVENT HFA) 110 MCG/ACT inhaler Inhale 2 puffs into the lungs 2 (two) times  daily. 01/01/23 01/01/24  Barnetta Chapel, MD  folic acid (FOLVITE) 1 MG tablet Take 1 tablet (1 mg total) by mouth daily. 05/04/23   [name], NP  gabapentin (NEURONTIN) 300 MG capsule Take 1 capsule (300 mg total) by mouth 2 (two) times daily. 06/20/23   Louann Sjogren, DPM  hydrOXYzine (VISTARIL) 50 MG capsule Take 1 capsule (50 mg total) by mouth at bedtime as needed. 04/20/23   [name], NP  losartan (COZAAR) 25 MG tablet Take 1 tablet (25 mg total) by mouth daily. 03/29/23   Sabharwal, Aditya, DO  metFORMIN (GLUCOPHAGE) 500 MG tablet Take 2 tablets (1,000 mg total) by mouth daily with breakfast AND 1 tablet (500 mg total) daily with supper. 05/04/23   [name], NP  metoprolol succinate (TOPROL-XL) 25 MG 24 hr tablet Take 1 tablet (25 mg total) by mouth daily. 03/29/23   Sabharwal, Eliezer Lofts, DO  Multiple Vitamin (MULTIVITAMIN WITH MINERALS) TABS tablet Take 1 tablet by mouth every other day.     [provider]  omega-3 acid ethyl esters (LOVAZA) 1 g capsule Take 1 capsule (1 g total) by mouth daily. 03/02/23   [name], NP  RSV vaccine recomb adjuvanted (AREXVY) 120 MCG/0.5ML injection Inject into the muscle. 07/18/23   Judyann Munson, MD  thiamine (VITAMIN B1) 100 MG tablet Take 1 tablet (100 mg total) by mouth  daily. 11/10/22     torsemide (DEMADEX) 20 MG tablet Take 1 tablet (20 mg total) by mouth daily. 02/20/23 06/12/23  Noralee Stain, DO  torsemide (DEMADEX) 20 MG tablet Take 1 tablet (20 mg total) by mouth daily. 04/28/23   [name], NP  triamcinolone cream (KENALOG) 0.5 % Apply topically to legs twice daily for up to 2 weeks as needed for eczema 12/09/22     triamcinolone ointment (KENALOG) 0.1 % Apply 1 Application topically 2 (two) times daily. To affected area on abdomen 04/06/23   Gwenith Daily, MD  Zoster Vaccine Adjuvanted [facility]) injection Inject into the muscle. 07/18/23   Judyann Munson, MD  icosapent Ethyl (VASCEPA) 1 g capsule Take 2 capsules (2  g total) by mouth 2 (two) times daily. 03/09/23 03/15/23  [name], NP     Allergies    Lisinopril and Codeine    Review of Systems   Review of Systems  Constitutional:  Positive for activity change, chills and fatigue.  HENT:  Positive for congestion and rhinorrhea. Negative for sore throat.   Respiratory:  Positive for cough, shortness of breath and wheezing.   Cardiovascular: Negative.   Gastrointestinal: Negative.   Genitourinary: Negative.   Musculoskeletal: Negative.   Neurological:  Positive for weakness (generalized).  All other systems reviewed and are negative.  Physical Exam Updated Vital Signs BP 136/67 (BP Location: Left Arm)   Pulse 60   Temp 98.8 F (37.1 C) (Oral)   Resp (!) 22   Ht 6\' 1"  (1.854 m)   Wt 107 kg   SpO2 90%   BMI 31.12 kg/m  Physical Exam Vitals and nursing note reviewed.  Constitutional:      General: He is in acute distress.     Appearance: He is well-developed. He is obese. He is ill-appearing. He is not diaphoretic.  HENT:     Head: Atraumatic.     Mouth/Throat:     Mouth: Mucous membranes are moist.  Eyes:     Pupils: Pupils are equal, round, and reactive to light.  Cardiovascular:     Rate and Rhythm: Normal rate and regular rhythm.     Pulses: Normal pulses.          Radial pulses are 2+ on the right side and 2+ on the left side.       Dorsalis pedis pulses are 2+ on the right side and 2+ on the left side.     Heart sounds: Normal heart sounds.  Pulmonary:     Effort: Pulmonary effort is normal. No respiratory distress.     Breath sounds: Wheezing and rhonchi present.  Abdominal:     General: Bowel sounds are normal. There is no distension.     Palpations: Abdomen is soft.     Comments: Large bandage over anterior abdomen, surrounding erythema around edges of bandaging. Patient refuses to remove bandage. Will not let me assess the area.  Musculoskeletal:        General: Normal range of motion.     Cervical back: Normal  range of motion and neck supple.     Right lower leg: No tenderness. No edema.     Left lower leg: No tenderness. No edema.     Comments: No bony tenderness, compartments soft, no lower extremity edema  Skin:    General: Skin is warm and dry.     Capillary Refill: Capillary refill takes less than 2 seconds.  Neurological:     General:  No focal deficit present.     Mental Status: He is alert and oriented to person, place, and time.    ED Results / Procedures / Treatments   Labs (all labs ordered are listed, but only abnormal results are displayed) Labs Reviewed  COMPREHENSIVE METABOLIC PANEL - Abnormal; Notable for the following components:      Result Value   Sodium 133 (*)    Chloride 93 (*)    Glucose, Bld 129 (*)    BUN 25 (*)    Creatinine, Ser 1.32 (*)    Total Protein 8.2 (*)    Anion gap 16 (*)    All other components within normal limits  CBC - Abnormal; Notable for the following components:   WBC 10.8 (*)    RBC 3.77 (*)    Hemoglobin 12.7 (*)    MCV 103.7 (*)    All other components within normal limits  BRAIN NATRIURETIC PEPTIDE - Abnormal; Notable for the following components:   B Natriuretic Peptide 204.4 (*)    All other components within normal limits  I-STAT CG4 LACTIC ACID, ED - Abnormal; Notable for the following components:   Lactic Acid, Venous 3.6 (*)    All other components within normal limits  I-STAT CG4 LACTIC ACID, ED - Abnormal; Notable for the following components:   Lactic Acid, Venous 3.2 (*)    All other components within normal limits  TROPONIN I (HIGH SENSITIVITY) - Abnormal; Notable for the following components:   Troponin I (High Sensitivity) 33 (*)    All other components within normal limits  TROPONIN I (HIGH SENSITIVITY) - Abnormal; Notable for the following components:   Troponin I (High Sensitivity) 28 (*)    All other components within normal limits  RESP PANEL BY RT-PCR (RSV, FLU A&B, COVID)  [id number]  CULTURE, BLOOD (ROUTINE X  2)  CULTURE, BLOOD (ROUTINE X 2)  CBC WITH DIFFERENTIAL/PLATELET  COMPREHENSIVE METABOLIC PANEL  MAGNESIUM  MAGNESIUM  PHOSPHORUS  BLOOD GAS, VENOUS  LACTIC ACID, PLASMA  PROCALCITONIN  STREP PNEUMONIAE URINARY ANTIGEN  PROTIME-INR  ETHANOL  SALICYLATE LEVEL  URINALYSIS, COMPLETE (UACMP) WITH MICROSCOPIC  HEMOGLOBIN A1C    EKG EKG Interpretation Date/Time:  Sunday August 06 2023 16:11:14 EST Ventricular Rate:  60 PR Interval:    QRS Duration:  162 QT Interval:  470 QTC Calculation: 470 R Axis:   -85  Text Interpretation: Ventricular-paced rhythm , atrial flutter Abnormal ECG No significant change since last tracing Confirmed by Elayne Snare ([id number]) on 08/06/2023 4:45:16 PM  Radiology CT ABDOMEN PELVIS W CONTRAST Result Date: 08/06/2023 CLINICAL DATA:  Abdominal pain EXAM: CT ABDOMEN AND PELVIS WITH CONTRAST TECHNIQUE: Multidetector CT imaging of the abdomen and pelvis was performed using the standard protocol following bolus administration of intravenous contrast. RADIATION DOSE REDUCTION: This exam was performed according to the departmental dose-optimization program which includes automated exposure control, adjustment of the mA and/or kV according to patient size and/or use of iterative reconstruction technique. CONTRAST:  75mL OMNIPAQUE IOHEXOL 350 MG/ML SOLN COMPARISON:  04/22/2023 FINDINGS: Lower chest: Bibasilar atelectasis or infiltrates. See chest CT report. Hepatobiliary: Liver is enlarged and low density compatible with fatty infiltration. Nodular contours compatible with cirrhosis. Findings stable since prior study. No focal hepatic abnormality. Gallbladder unremarkable. Pancreas: No focal abnormality or ductal dilatation. Spleen: No focal abnormality.  Normal size. Adrenals/Urinary Tract: No adrenal abnormality. No focal renal abnormality. No stones or hydronephrosis. Urinary bladder is unremarkable. Stomach/Bowel: Prior colectomy. Right lower quadrant ileostomy.  Stomach  and small bowel decompressed. No bowel obstruction or inflammatory process. Vascular/Lymphatic: Diffuse aortoiliac atherosclerosis. No evidence of aneurysm or adenopathy. Reproductive: No visible focal abnormality. Other: Small amount of perihepatic ascites.  No free air. Musculoskeletal: No acute bony abnormality. IMPRESSION: Changes of fatty liver, hepatomegaly and cirrhosis, stable. Small amount of perihepatic ascites. 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 No bowel obstruction. Aortoiliac atherosclerosis. No acute findings. Electronically Signed   By: Charlett Nose M.D.   On: 08/06/2023 20:23   CT Angio Chest PE W and/or Wo Contrast Result Date: 08/06/2023 CLINICAL DATA:  Pulmonary embolism (PE) suspected, high prob. Sob, hypoxia, hx of PE not anticoagulated, abd pain, abd wound; EXAM: CT ANGIOGRAPHY CHEST WITH CONTRAST TECHNIQUE: Multidetector CT imaging of the chest was performed using the standard protocol during bolus administration of intravenous contrast. Multiplanar CT image reconstructions and MIPs were obtained to evaluate the vascular anatomy. RADIATION DOSE REDUCTION: This exam was performed according to the departmental dose-optimization program which includes automated exposure control, adjustment of the mA and/or kV according to patient size and/or use of iterative reconstruction technique. CONTRAST:  75mL OMNIPAQUE IOHEXOL 350 MG/ML SOLN COMPARISON:  12/23/2022 FINDINGS: Cardiovascular: Cardiomegaly. Coronary artery and aortic atherosclerosis. No filling defects in the pulmonary arteries to suggest pulmonary emboli. Mediastinum/Nodes: No mediastinal, hilar, or axillary adenopathy. Trachea and esophagus are unremarkable. Thyroid unremarkable. Lungs/Pleura: Peribronchial thickening diffusely, most pronounced in the lower lobes and similar to prior study. Bibasilar airspace opacities could reflect atelectasis or pneumonia. No effusions. Upper Abdomen:  No acute findings Musculoskeletal: Chest wall soft tissues are unremarkable. No acute bony abnormality. Review of the MIP images confirms the above findings. IMPRESSION: No evidence of pulmonary embolus. Cardiomegaly, coronary artery disease. Bronchial wall thickening compatible with bronchitis. This is similar to prior study. Continued bibasilar airspace opacities, atelectasis versus pneumonia. Opacities less pronounced than prior study. Aortic Atherosclerosis (ICD10-I70.0). Electronically Signed   By: Charlett Nose M.D.   On: 08/06/2023 20:20   DG Chest Portable 1 View Result Date: 08/06/2023 CLINICAL DATA:  Shortness of breath and hypoxia EXAM: PORTABLE CHEST 1 VIEW COMPARISON:  Chest x-ray 02/17/2023. FINDINGS: There some patchy and interstitial opacities in both lung bases. The cardiomediastinal silhouette is stable, the heart is mildly enlarged. There is no pneumothorax or acute fracture. IMPRESSION: Patchy and interstitial opacities in both lung bases, concerning for pneumonia versus edema. Electronically Signed   By: Darliss Cheney M.D.   On: 08/06/2023 17:06    Procedures .Critical Care  Performed by: Linwood Dibbles, PA-C Authorized by: Linwood Dibbles, PA-C   Critical care provider statement:    Critical care time (minutes):  35   Critical care was necessary to treat or prevent imminent or life-threatening deterioration of the following conditions:  Sepsis and respiratory failure   Critical care was time spent personally by me on the following activities:  Development of treatment plan with patient or surrogate, discussions with consultants, evaluation of patient's response to treatment, examination of patient, ordering and review of laboratory studies, ordering and review of radiographic studies, ordering and performing treatments and interventions, pulse oximetry, re-evaluation of patient's condition and review of old charts     Medications Ordered in ED Medications   acetaminophen (TYLENOL) tablet 650 mg (has no administration in time range)    Or  acetaminophen (TYLENOL) suppository 650 mg (has no administration in time range)  melatonin tablet 3 mg (has no administration in time range)  ondansetron (ZOFRAN) injection 4 mg (has no administration  in time range)  methylPREDNISolone sodium succinate (SOLU-MEDROL) 125 mg/2 mL injection 80 mg (has no administration in time range)  ipratropium-albuterol (DUONEB) 0.5-2.5 (3) MG/3ML nebulizer solution 3 mL (has no administration in time range)  azithromycin (ZITHROMAX) 500 mg in sodium chloride 0.9 % 250 mL IVPB (has no administration in time range)  albuterol (PROVENTIL) (2.5 MG/3ML) 0.083% nebulizer solution 2.5 mg (has no administration in time range)  cefTRIAXone (ROCEPHIN) 1 g in sodium chloride 0.9 % 100 mL IVPB (has no administration in time range)  guaiFENesin (MUCINEX) 12 hr tablet 600 mg (has no administration in time range)  benzonatate (TESSALON) capsule 200 mg (has no administration in time range)  insulin aspart (novoLOG) injection 0-9 Units (has no administration in time range)  atorvastatin (LIPITOR) tablet 10 mg (has no administration in time range)  loratadine (CLARITIN) tablet 10 mg (has no administration in time range)  gabapentin (NEURONTIN) capsule 300 mg (has no administration in time range)  metoprolol succinate (TOPROL-XL) 24 hr tablet 25 mg (has no administration in time range)  thiamine (VITAMIN B1) tablet 100 mg (has no administration in time range)  sodium chloride 0.9 % bolus 500 mL (0 mLs Intravenous Stopped 08/06/23 1846)  ipratropium-albuterol (DUONEB) 0.5-2.5 (3) MG/3ML nebulizer solution 3 mL (3 mLs Nebulization Given 08/06/23 1731)  methylPREDNISolone sodium succinate (SOLU-MEDROL) 125 mg/2 mL injection 125 mg (125 mg Intravenous Given 08/06/23 1724)  sodium chloride 0.9 % bolus 500 mL (0 mLs Intravenous Stopped 08/06/23 2256)  iohexol (OMNIPAQUE) 350 MG/ML injection 75 mL (75 mLs  Intravenous Contrast Given 08/06/23 2003)  cefTRIAXone (ROCEPHIN) 1 g in sodium chloride 0.9 % 100 mL IVPB (0 g Intravenous Stopped 08/06/23 2149)  azithromycin (ZITHROMAX) 500 mg in sodium chloride 0.9 % 250 mL IVPB (0 mg Intravenous Stopped 08/06/23 2234)    ED Course/ Medical Decision Making/ A&P Clinical Course as of 08/06/23 2312  Sun Aug 06, 2023  1813 Troponin I (High Sensitivity)(!): 33 [LS]  2110 Patient was on 5 L via nasal cannula.  Oxygen to 88%.  Nursing switched him to nonrebreather.  Patient cannot tolerate the mask on his face.  Discussed with nursing will have RT assess for high flow [place] [BH]  2137 [address]ter with medicine for admission [BH]    Clinical Course User Index [BH] Burak Zerbe A, PA-C [LS] Meade Maw, Student-PA   17 old multiple medical problems here for evaluation of shortness of breath.  Noted to be hypoxic to 82% on room air on arrival.  Patient with wheeze, rhonchi.  Has a history of heart failure admits to some PND and orthopnea however no lower extremity swelling.  He has a chronic wound to his abdomen and ostomy bag which she is followed by wound care he has large bandage over this area however he refuses to let me assess this area and remove his bandaging.  Will plan on broad workup  Labs and imaging personally viewed and interpreted:  Lactic 3.6--repeat 3.2 BNP 204 CBC leukocytosis 10.8 Metabolic panel creatinine 1.3 Chest x-ray with infiltrates versus edema  Patient initially given 500 cc bolus given concern for edema.  Will plan on CT imaging chest and abdomen  CTA with opacities we will treat antibiotics for pneumonia CT abdomen pelvis without acute abnormality  Patient reassessed.  Has had increase in supplemental oxygen need.  Was on 5 L via nasal cannula however was hypoxic to the high 80s.  Nursing had switched him to a nonrebreather however patient was unable to tolerate this.  Discussed with RT will place on high flow nasal  cannula  Patient does meet SIRS criteria with hypoxia, tachypnea, leukocytosis and source of infection.  Treated with IV fluids and antibiotics. Will admit for acute hypoxic respiratory failure  Discussed with Dr. Arlean Hopping with medicine who is agreeable to evaluate for admission.  The patient appears reasonably stabilized for admission considering the current resources, flow, and capabilities available in the ED at this time, and I doubt any other [facility] requiring further screening and/or treatment in the ED prior to admission.                                 Medical Decision Making Amount and/or Complexity of Data Reviewed External Data Reviewed: labs, radiology, ECG and notes. Labs: ordered. Decision-making details documented in ED Course. Radiology: ordered and independent interpretation performed. Decision-making details documented in ED Course. ECG/medicine tests: ordered and independent interpretation performed. Decision-making details documented in ED Course.  Risk OTC drugs. Prescription drug management. Parenteral controlled substances. Decision regarding hospitalization. Diagnosis or treatment significantly limited by social determinants of health.         Final Clinical Impression(s) / ED Diagnoses Final diagnoses:  COPD exacerbation (HCC)  Pneumonia of both lower lobes due to infectious organism  Acute respiratory failure with hypoxia (HCC)  Elevated troponin  SIRS (systemic inflammatory response syndrome) (HCC)    Rx / DC Orders ED Discharge Orders     None         Maisyn Nouri A, PA-C 08/06/23 2312    Elayne Snare K, DO 08/06/23 2354

## 2023-08-06 NOTE — ED Notes (Signed)
 PT refused first CT scan due to wanting a coke.Educated PT on importance of having CT scan and PT agreed to go down. CT was contacted and is willing to come back down to get him.

## 2023-08-06 NOTE — ED Provider Triage Note (Signed)
 Emergency Medicine Provider Triage Evaluation Note  Luis [name] , a 63 y.o. male  was evaluated in triage.  Pt complains of SOB.  Review of Systems  Positive:  Negative:   Physical Exam  BP 127/82 (BP Location: Left Arm)   Pulse (!) 58   Temp 98.2 F (36.8 C) (Oral)   Resp 20   Ht 6\' 1"  (1.854 m)   Wt 107 kg   SpO2 90%   BMI 31.12 kg/m  Gen:   Awake, no distress   Resp:  Normal effort  MSK:   Moves extremities without difficulty  Other:    Medical Decision Making  Medically screening exam initiated at 4:08 PM.  Appropriate orders placed.  Luis [name] was informed that the remainder of the evaluation will be completed by another provider, this initial triage assessment does not replace that evaluation, and the importance of remaining in the ED until their evaluation is complete.  No O2 requirement at baseline. Currently on 3L to keep O2 >90%. Concerned for SOB and coughing x2 weeks. Patient stating "I know its PNA" because he has had these exact symptoms and diagnosis 3 times in the past. Patient chronically ill. Chronic wound on his stomach and ostomy bag. Denies fevers.    Luis [name], [place] 08/06/23 1610

## 2023-08-06 NOTE — ED Triage Notes (Signed)
 The pt is c/o diff breathing for the past 2 weeks  he has not seen his doctor he is not on 02  he reported that last night was worse he reports that he thinks he has pneumonia  he did not call an ambulance because he was told not to call an ambulance

## 2023-08-06 NOTE — ED Triage Notes (Signed)
 The pt was placed on  nasal   02  at 3 liters  sats still in the high 80s

## 2023-08-07 ENCOUNTER — Ambulatory Visit: Payer: Medicaid Other | Admitting: Pulmonary Disease

## 2023-08-07 ENCOUNTER — Encounter: Payer: Self-pay | Admitting: Pulmonary Disease

## 2023-08-07 ENCOUNTER — Inpatient Hospital Stay (HOSPITAL_COMMUNITY): Payer: Medicaid Other

## 2023-08-07 DIAGNOSIS — J811 Chronic pulmonary edema: Secondary | ICD-10-CM | POA: Diagnosis not present

## 2023-08-07 DIAGNOSIS — I5032 Chronic diastolic (congestive) heart failure: Secondary | ICD-10-CM | POA: Diagnosis not present

## 2023-08-07 DIAGNOSIS — J9601 Acute respiratory failure with hypoxia: Secondary | ICD-10-CM | POA: Diagnosis not present

## 2023-08-07 DIAGNOSIS — R7989 Other specified abnormal findings of blood chemistry: Secondary | ICD-10-CM | POA: Diagnosis not present

## 2023-08-07 DIAGNOSIS — J441 Chronic obstructive pulmonary disease with (acute) exacerbation: Secondary | ICD-10-CM | POA: Diagnosis not present

## 2023-08-07 DIAGNOSIS — E1142 Type 2 diabetes mellitus with diabetic polyneuropathy: Secondary | ICD-10-CM | POA: Diagnosis not present

## 2023-08-07 DIAGNOSIS — R0603 Acute respiratory distress: Secondary | ICD-10-CM | POA: Diagnosis not present

## 2023-08-07 DIAGNOSIS — S31109S Unspecified open wound of abdominal wall, unspecified quadrant without penetration into peritoneal cavity, sequela: Secondary | ICD-10-CM | POA: Diagnosis not present

## 2023-08-07 DIAGNOSIS — J189 Pneumonia, unspecified organism: Secondary | ICD-10-CM | POA: Diagnosis not present

## 2023-08-07 DIAGNOSIS — J90 Pleural effusion, not elsewhere classified: Secondary | ICD-10-CM | POA: Diagnosis not present

## 2023-08-07 DIAGNOSIS — J449 Chronic obstructive pulmonary disease, unspecified: Secondary | ICD-10-CM | POA: Diagnosis not present

## 2023-08-07 DIAGNOSIS — E782 Mixed hyperlipidemia: Secondary | ICD-10-CM | POA: Diagnosis not present

## 2023-08-07 DIAGNOSIS — I10 Essential (primary) hypertension: Secondary | ICD-10-CM | POA: Diagnosis not present

## 2023-08-07 DIAGNOSIS — E872 Acidosis, unspecified: Secondary | ICD-10-CM | POA: Diagnosis not present

## 2023-08-07 LAB — CBC WITH DIFFERENTIAL/PLATELET
Abs Immature Granulocytes: 0.06 10*3/uL (ref 0.00–0.07)
Basophils Absolute: 0 10*3/uL (ref 0.0–0.1)
Basophils Relative: 0 %
Eosinophils Absolute: 0 10*3/uL (ref 0.0–0.5)
Eosinophils Relative: 0 %
HCT: 37.8 % — ABNORMAL LOW (ref 39.0–52.0)
Hemoglobin: 12.6 g/dL — ABNORMAL LOW (ref 13.0–17.0)
Immature Granulocytes: 1 %
Lymphocytes Relative: 4 %
Lymphs Abs: 0.4 10*3/uL — ABNORMAL LOW (ref 0.7–4.0)
MCH: 34.1 pg — ABNORMAL HIGH (ref 26.0–34.0)
MCHC: 33.3 g/dL (ref 30.0–36.0)
MCV: 102.2 fL — ABNORMAL HIGH (ref 80.0–100.0)
Monocytes Absolute: 0.2 10*3/uL (ref 0.1–1.0)
Monocytes Relative: 2 %
Neutro Abs: 9.5 10*3/uL — ABNORMAL HIGH (ref 1.7–7.7)
Neutrophils Relative %: 93 %
Platelets: 190 10*3/uL (ref 150–400)
RBC: 3.7 MIL/uL — ABNORMAL LOW (ref 4.22–5.81)
RDW: 14.8 % (ref 11.5–15.5)
WBC: 10.1 10*3/uL (ref 4.0–10.5)
nRBC: 0 % (ref 0.0–0.2)

## 2023-08-07 LAB — RENAL FUNCTION PANEL
Albumin: 3.7 g/dL (ref 3.5–5.0)
Anion gap: 11 (ref 5–15)
BUN: 38 mg/dL — ABNORMAL HIGH (ref 8–23)
CO2: 23 mmol/L (ref 22–32)
Calcium: 8.9 mg/dL (ref 8.9–10.3)
Chloride: 97 mmol/L — ABNORMAL LOW (ref 98–111)
Creatinine, Ser: 1.54 mg/dL — ABNORMAL HIGH (ref 0.61–1.24)
GFR, Estimated: 50 mL/min — ABNORMAL LOW (ref >60)
Glucose, Bld: 253 mg/dL — ABNORMAL HIGH (ref 70–99)
Phosphorus: 2.7 mg/dL (ref 2.5–4.6)
Potassium: 6 mmol/L — ABNORMAL HIGH (ref 3.5–5.1)
Sodium: 131 mmol/L — ABNORMAL LOW (ref 135–145)

## 2023-08-07 LAB — URINALYSIS, COMPLETE (UACMP) WITH MICROSCOPIC
Bacteria, UA: NONE SEEN
Bilirubin Urine: NEGATIVE
Glucose, UA: >=500 mg/dL — AB
Hgb urine dipstick: NEGATIVE
Ketones, ur: NEGATIVE mg/dL
Leukocytes,Ua: NEGATIVE
Nitrite: NEGATIVE
Protein, ur: 30 mg/dL — AB
Specific Gravity, Urine: 1.036 — ABNORMAL HIGH (ref 1.005–1.030)
pH: 6 (ref 5.0–8.0)

## 2023-08-07 LAB — COMPREHENSIVE METABOLIC PANEL
ALT: 31 U/L (ref 0–44)
AST: 31 U/L (ref 15–41)
Albumin: 3.7 g/dL (ref 3.5–5.0)
Alkaline Phosphatase: 110 U/L (ref 38–126)
Anion gap: 12 (ref 5–15)
BUN: 28 mg/dL — ABNORMAL HIGH (ref 8–23)
CO2: 21 mmol/L — ABNORMAL LOW (ref 22–32)
Calcium: 9 mg/dL (ref 8.9–10.3)
Chloride: 96 mmol/L — ABNORMAL LOW (ref 98–111)
Creatinine, Ser: 1.39 mg/dL — ABNORMAL HIGH (ref 0.61–1.24)
GFR, Estimated: 57 mL/min — ABNORMAL LOW (ref >60)
Glucose, Bld: 255 mg/dL — ABNORMAL HIGH (ref 70–99)
Potassium: 6 mmol/L — ABNORMAL HIGH (ref 3.5–5.1)
Sodium: 129 mmol/L — ABNORMAL LOW (ref 135–145)
Total Bilirubin: 1 mg/dL (ref 0.0–1.2)
Total Protein: 7.8 g/dL (ref 6.5–8.1)

## 2023-08-07 LAB — CBG MONITORING, ED
Glucose-Capillary: 264 mg/dL — ABNORMAL HIGH (ref 70–99)
Glucose-Capillary: 296 mg/dL — ABNORMAL HIGH (ref 70–99)

## 2023-08-07 LAB — MAGNESIUM: Magnesium: 2.2 mg/dL (ref 1.7–2.4)

## 2023-08-07 LAB — PROTIME-INR
INR: 1.1 (ref 0.8–1.2)
Prothrombin Time: 14.3 s (ref 11.4–15.2)

## 2023-08-07 LAB — LACTIC ACID, PLASMA: Lactic Acid, Venous: 1.9 mmol/L (ref 0.5–1.9)

## 2023-08-07 LAB — PROCALCITONIN: Procalcitonin: 7.67 ng/mL

## 2023-08-07 LAB — GLUCOSE, CAPILLARY
Glucose-Capillary: 271 mg/dL — ABNORMAL HIGH (ref 70–99)
Glucose-Capillary: 329 mg/dL — ABNORMAL HIGH (ref 70–99)

## 2023-08-07 LAB — PHOSPHORUS: Phosphorus: 4.4 mg/dL (ref 2.5–4.6)

## 2023-08-07 LAB — STREP PNEUMONIAE URINARY ANTIGEN: Strep Pneumo Urinary Antigen: NEGATIVE

## 2023-08-07 MED ORDER — INSULIN ASPART 100 UNIT/ML IJ SOLN
0.0000 [IU] | Freq: Three times a day (TID) | INTRAMUSCULAR | Status: DC
  Administered 2023-08-07 – 2023-08-08 (×6): 8 [IU] via SUBCUTANEOUS

## 2023-08-07 MED ORDER — SODIUM ZIRCONIUM CYCLOSILICATE 10 G PO PACK
10.0000 g | PACK | Freq: Once | ORAL | Status: AC
  Administered 2023-08-07: 10 g via ORAL
  Filled 2023-08-07: qty 1

## 2023-08-07 MED ORDER — BIOTENE DRY MOUTH MT LIQD
15.0000 mL | OROMUCOSAL | Status: DC | PRN
  Administered 2023-08-07: 15 mL via OROMUCOSAL

## 2023-08-07 MED ORDER — INSULIN ASPART 100 UNIT/ML IJ SOLN
0.0000 [IU] | Freq: Every day | INTRAMUSCULAR | Status: DC
  Administered 2023-08-07 – 2023-08-08 (×2): 4 [IU] via SUBCUTANEOUS

## 2023-08-07 MED ORDER — IPRATROPIUM-ALBUTEROL 0.5-2.5 (3) MG/3ML IN SOLN: 3.0000 mL | Freq: Two times a day (BID) | RESPIRATORY_TRACT | Status: DC

## 2023-08-07 MED ORDER — LOSARTAN POTASSIUM 50 MG PO TABS: 25.0000 mg | ORAL_TABLET | Freq: Every day | ORAL | Status: DC

## 2023-08-07 MED ORDER — ISOSORBIDE MONONITRATE ER 30 MG PO TB24
30.0000 mg | ORAL_TABLET | Freq: Every day | ORAL | Status: DC
  Administered 2023-08-07 – 2023-08-14 (×8): 30 mg via ORAL
  Filled 2023-08-07 (×8): qty 1

## 2023-08-07 MED ORDER — ENOXAPARIN SODIUM 40 MG/0.4ML IJ SOSY
40.0000 mg | PREFILLED_SYRINGE | INTRAMUSCULAR | Status: DC
  Administered 2023-08-07 – 2023-08-10 (×4): 40 mg via SUBCUTANEOUS
  Filled 2023-08-07 (×5): qty 0.4

## 2023-08-07 MED ORDER — INSULIN GLARGINE-YFGN 100 UNIT/ML ~~LOC~~ SOPN
15.0000 [IU] | PEN_INJECTOR | SUBCUTANEOUS | Status: DC
  Filled 2023-08-07: qty 3

## 2023-08-07 MED ORDER — FUROSEMIDE 10 MG/ML IJ SOLN
40.0000 mg | Freq: Once | INTRAMUSCULAR | Status: AC
  Administered 2023-08-07: 40 mg via INTRAVENOUS
  Filled 2023-08-07: qty 4

## 2023-08-07 MED ORDER — INSULIN ASPART 100 UNIT/ML IJ SOLN
4.0000 [IU] | Freq: Three times a day (TID) | INTRAMUSCULAR | Status: DC
  Administered 2023-08-07 (×2): 4 [IU] via SUBCUTANEOUS

## 2023-08-07 MED ORDER — NICOTINE 21 MG/24HR TD PT24
21.0000 mg | MEDICATED_PATCH | Freq: Every day | TRANSDERMAL | Status: DC
  Administered 2023-08-07 – 2023-08-14 (×8): 21 mg via TRANSDERMAL
  Filled 2023-08-07 (×8): qty 1

## 2023-08-07 MED ORDER — AMLODIPINE BESYLATE 10 MG PO TABS
10.0000 mg | ORAL_TABLET | Freq: Every day | ORAL | Status: DC
  Administered 2023-08-07 – 2023-08-14 (×8): 10 mg via ORAL
  Filled 2023-08-07 (×7): qty 1
  Filled 2023-08-07: qty 2

## 2023-08-07 MED ORDER — INSULIN GLARGINE 100 UNIT/ML ~~LOC~~ SOLN
15.0000 [IU] | Freq: Every day | SUBCUTANEOUS | Status: DC
  Administered 2023-08-07: 15 [IU] via SUBCUTANEOUS
  Filled 2023-08-07 (×2): qty 0.15

## 2023-08-07 MED ORDER — FUROSEMIDE 10 MG/ML IJ SOLN: 40.0000 mg | Freq: Every day | INTRAMUSCULAR | Status: DC

## 2023-08-07 MED ORDER — FOLIC ACID 1 MG PO TABS
1.0000 mg | ORAL_TABLET | Freq: Every day | ORAL | Status: DC
  Administered 2023-08-07 – 2023-08-14 (×8): 1 mg via ORAL
  Filled 2023-08-07 (×8): qty 1

## 2023-08-07 MED ORDER — DAPAGLIFLOZIN PROPANEDIOL 10 MG PO TABS
10.0000 mg | ORAL_TABLET | Freq: Every day | ORAL | Status: DC
  Administered 2023-08-08 – 2023-08-14 (×7): 10 mg via ORAL
  Filled 2023-08-07 (×7): qty 1

## 2023-08-07 NOTE — Progress Notes (Signed)
 Pt admitted to [facility] today with a chronic abdominal wound. Charge RN Lulu admitted pt, and pt would not allow charge RN or this Clinical research associate to look at wound. Pt stated that he would like the wound ostomy RN to look and dress his abdominal wound tomorrow, 2/24, despite wound care dressing orders in [facility] RN note from today, 2/24.

## 2023-08-07 NOTE — Consult Note (Signed)
 WOC Nurse Consult Note: Reason for Consult: Management of abdominal wound; patient is noted to have ileostomy as well.   Reviewed outpatient consultation notes.   Supplies Cleanse abdominal wound with Vashe [place] # [phone], pat dry. Cover with silver hydrofiber [place] # [id number]), top with ABD pads, secure with tape. Perform 2x week beginning 08/07/23 Q Mon/Thurs  Ostomy; with large hernia.  [place] # [phone] 1pc flat ostomy pouch [place] # [phone] 2" barrier ring   Change 2x week Q Monday/Thursday based on patient's change schedule.  Patient has next outpatient FU with ostomy clinic (was tomorrow) I will ask ostomy NP to add patient to schedule next week.   Norma Montemurro [facility], CNS, [employer] [phone]

## 2023-08-07 NOTE — Progress Notes (Signed)
 PROGRESS NOTE  Luis [name] AVW:[id number] DOB: Dec 08, 1960   PCP: [name], NP  Patient is from: Home.  Lives alone.  Independently ambulates at baseline.  DOA: 08/06/2023 LOS: 1  Chief complaints Chief Complaint  Patient presents with   Shortness of Breath     Brief Narrative / Interim history: 63 year old M with PMH of COPD, diastolic CHF, CHB/PPM, DM-2, ileostomy, chronic abdominal wound and tobacco use disorder presenting with progressive shortness of breath for about a week, productive cough for 2 to 3 days, fever, wheezing and orthopnea, and admitted with working diagnosis of acute hypoxic respiratory failure in the setting of community-acquired pneumonia and COPD exacerbation.  In ED, hypoxic to 82% and started on 5 L by [place] to recover to low 90s. Cr 1.32.  BUN 29. Na 123.  WBC 10.8.  Hgb 12.7.  BNP 204.  Troponin 33>> 28.  EKG looks like V paced rhythm with flutter. Lactic acid 3.6>> 3.2.  COVID-19, influenza and RSV PCR nonreactive.  Portable CXR showed patchy and interstitial opacities in both lung bases concerning for pneumonia versus edema.  CT angio chest, abdomen and pelvis negative for PE but showed cardiomegaly, CAD, bronchitis and continued bibasilar airspace opacities and aortic atherosclerosis.  Cultures obtained.  Patient was started on CTX, Zithromax, Solu-Medrol, scheduled and as needed nebulizers and admitted.  Patient required up to 15 L by HFNC this morning.  He was given IV Lasix 40 mg x 1 early in the morning.  I was able to wean down to 8 L by HFNC later in the morning.  Subjective: Seen and examined earlier this morning.  Required up to 15 L by HFNC but I was able to wean him down to 8 L by HFNC during my visit.  He was sleepy but wakes to voice.  Reports improvement in his breathing.  Still with some productive cough.  Denies hemoptysis.  Denies chest pain, abdominal pain or UTI symptoms.  Objective: Vitals:   08/07/23 0924 08/07/23 0925 08/07/23 0945  08/07/23 1044  BP:   (!) 179/53 (!) 150/112  Pulse: (!) 58  80 69  Resp: (!) 28  (!) 22   Temp:      TempSrc:      SpO2: 97% 95% 93%   Weight:      Height:        Examination:  GENERAL: No apparent distress.  Nontoxic. HEENT: MMM.  Vision and hearing grossly intact.  NECK: Supple.  Difficult to assess JVD due to body habitus RESP:  No IWOB.  Fair aeration bilaterally. CVS:  RRR. Heart sounds normal.  ABD/GI/GU: BS+. Abd slightly distended.  Has RLQ ileostomy.  Chronic abdominal wound.  MSK/EXT:  Moves extremities. No apparent deformity. No edema.  SKIN: no apparent skin lesion or wound NEURO: Awake, alert and oriented appropriately.  No apparent focal neuro deficit. PSYCH: Calm. Normal affect.   Procedures:  None  Microbiology summarized: COVID-19, influenza and RSV PCR nonreactive  Assessment and plan: Acute respiratory failure with hypoxia due to pneumonia, COPD exacerbation and possible underlying CHF.  Presents with progressive dyspnea, productive cough, subjective fever, weight and orthopnea.  CT angio concerning for bibasilar infiltrate.  He has lactic acidosis, mild leukocytosis and marked elevated Pro-Cal. required up to 15 L by HFNC this morning.  He was given IV Lasix 40 mg x 1 early in the morning.  I was able to wean down to 8 L by HFNC later in the morning.  No significant respiratory  distress during my evaluation. -Continue antibiotics, steroid, scheduled and as needed nebulizers -Will continue IV Lasix 40 mg daily.  Received a dose earlier today.  Will redose in the morning or as needed -Wean oxygen as able.  Minimum oxygen to keep saturation above 88%.  He is high risk for CO2 retention. -Incentive spirometry, OOB, PT/OT  Community-acquired pneumonia: -Management as above  COPD exacerbation: Has cardinal symptoms.  Smokes cigarettes. -Management as above -Encouraged smoking cessation. -Optimize controllers on discharge  Acute on chronic diastolic CHF: TTE  in 12/2022 with LVEF of 50 to 55%, G2-DD.  He is on torsemide and Farxiga at home.  BNP 204 but unreliable due to obesity.  Difficult to assess fluid status given body habitus.  Received IV Lasix 40 mg x 1 earlier this morning.  Blood pressure still elevated. -Continue IV Lasix -Resume home Farxiga -Strict intake and output, daily weight, renal functions and electrolytes  AKI: Baseline Cr 0.8-0.9 although his creatinine is 1.23 in 02/2023. Recent Labs    02/15/23 1745 02/16/23 0423 02/17/23 0833 02/18/23 1206 02/19/23 0424 02/20/23 0448 03/06/23 1115 03/20/23 1058 08/06/23 1650 08/07/23 0319  BUN 15 16 30* 39* 39* 32* 32* 30* 25* 28*  CREATININE 0.83 1.03 0.99 1.05 0.97 0.84 1.23 1.35* 1.32* 1.39*  -Hold home losartan  NIDDM-2 with hyperglycemia: A1c 7.3%.  Seems he is only on metformin and Farxiga at home.  Now interested in weight. Recent Labs  Lab 08/07/23 0800  GLUCAP 264*  -Resume home Farxiga -Hold metformin in the setting of lactic acidosis -Continue SSI-moderate -Add NovoLog 4 units 3 times daily with meals -Add Semglee 15 units daily  Uncontrolled hypertension: BP elevated. -Resume home Toprol-XL and Farxiga -Imdur 30 mg daily -Hold home losartan in the setting of AKI  Complete heart block s/p PPM: EKG with V paced rhythm and flutter but rate controlled. -Continue home Toprol-XL  Ileostomy/chronic abdominal wound: Goes to outpatient wound care. -Consult WOCN  Elevated troponin: Likely demand ischemia -Management as above  Lactic acidosis: Likely due to respiratory failure and metformin -Management as above.  Hyponatremia: Due to ARB and AKI? -Recheck renal panel in the afternoon  Hyperkalemia: K6.0.  Likely due to ARB and AKI -Lokelma 10 g x 1 -Recheck renal panel in the afternoon   Tobacco use disorder: Reports smoking less than a pack a day.  Hesitant to quantify.  -Encouraged smoking cessation -Nicotine patch  At risk for sleep apnea -Needs  outpatient evaluation but has claustrophobia -Minimize oxygen  Obesity Body mass index is 31.12 kg/m.           DVT prophylaxis:  enoxaparin (LOVENOX) injection 40 mg Start: 08/07/23 1100 SCDs Start: 08/06/23 2142  Code Status: Full code Family Communication: None at bedside Level of care: Progressive Status is: Inpatient Remains inpatient appropriate because: Acute respiratory failure with hypoxia due to pneumonia, COPD exacerbation and CHF   Final disposition: Home once medically cleared Consultants:  None  55 minutes with more than 50% spent in reviewing records, counseling patient/family and coordinating care.   Sch Meds:  Scheduled Meds:  amLODipine  10 mg Oral Daily   atorvastatin  10 mg Oral Daily   [START ON 08/08/2023] dapagliflozin propanediol  10 mg Oral QAC breakfast   enoxaparin (LOVENOX) injection  40 mg Subcutaneous Q24H   folic acid  1 mg Oral Daily   [START ON 08/08/2023] furosemide  40 mg Intravenous Daily   gabapentin  300 mg Oral BID   guaiFENesin  600 mg  Oral BID   insulin aspart  0-15 Units Subcutaneous TID WC   insulin aspart  0-5 Units Subcutaneous QHS   insulin aspart  4 Units Subcutaneous TID WC   insulin glargine-yfgn  15 Units Subcutaneous Q24H   ipratropium-albuterol  3 mL Nebulization Q6H   isosorbide mononitrate  30 mg Oral Daily   loratadine  10 mg Oral Daily   methylPREDNISolone (SOLU-MEDROL) injection  80 mg Intravenous Q12H   metoprolol succinate  25 mg Oral Daily   nicotine  21 mg Transdermal Daily   thiamine  100 mg Oral Daily   Continuous Infusions:  azithromycin     cefTRIAXone (ROCEPHIN)  IV     PRN Meds:.acetaminophen **OR** acetaminophen, albuterol, benzonatate, melatonin, ondansetron (ZOFRAN) IV  Antimicrobials: Anti-infectives (From admission, onward)    Start     Dose/Rate Route Frequency Ordered Stop   08/07/23 1500  azithromycin (ZITHROMAX) 500 mg in sodium chloride 0.9 % 250 mL IVPB       Note to Pharmacy:  (In setting of acute copd exac)   500 mg 250 mL/hr over 60 Minutes Intravenous Every 24 hours 08/06/23 2145     08/07/23 1500  cefTRIAXone (ROCEPHIN) 1 g in sodium chloride 0.9 % 100 mL IVPB        1 g 200 mL/hr over 30 Minutes Intravenous Every 24 hours 08/06/23 2145     08/06/23 2030  cefTRIAXone (ROCEPHIN) 1 g in sodium chloride 0.9 % 100 mL IVPB        1 g 200 mL/hr over 30 Minutes Intravenous  Once 08/06/23 2028 08/06/23 2149   08/06/23 2030  azithromycin (ZITHROMAX) 500 mg in sodium chloride 0.9 % 250 mL IVPB        500 mg 250 mL/hr over 60 Minutes Intravenous  Once 08/06/23 2028 08/06/23 2234        I have personally reviewed the following labs and images: CBC: Recent Labs  Lab 08/06/23 1650 08/07/23 0319  WBC 10.8* 10.1  NEUTROABS  --  9.5*  HGB 12.7* 12.6*  HCT 39.1 37.8*  MCV 103.7* 102.2*  PLT 211 190   BMP &GFR Recent Labs  Lab 08/06/23 1650 08/06/23 2315 08/07/23 0319  NA 133*  --  129*  K 5.0  --  6.0*  CL 93*  --  96*  CO2 24  --  21*  GLUCOSE 129*  --  255*  BUN 25*  --  28*  CREATININE 1.32*  --  1.39*  CALCIUM 9.5  --  9.0  MG  --  2.1 2.2  PHOS  --   --  4.4   Estimated Creatinine Clearance: 68.9 mL/min (A) (by C-G formula based on SCr of 1.39 mg/dL (H)). Liver & Pancreas: Recent Labs  Lab 08/06/23 1650 08/07/23 0319  AST 32 31  ALT 29 31  ALKPHOS 118 110  BILITOT 0.9 1.0  PROT 8.2* 7.8  ALBUMIN 3.9 3.7   No results for input(s): "LIPASE", "AMYLASE" in the last 168 hours. No results for input(s): "AMMONIA" in the last 168 hours. Diabetic: Recent Labs    08/06/23 2315  HGBA1C 7.3*   Recent Labs  Lab 08/07/23 0800  GLUCAP 264*   Cardiac Enzymes: No results for input(s): "CKTOTAL", "CKMB", "CKMBINDEX", "TROPONINI" in the last 168 hours. No results for input(s): "PROBNP" in the last 8760 hours. Coagulation Profile: Recent Labs  Lab 08/07/23 0319  INR 1.1   Thyroid Function Tests: No results for input(s): "TSH",  "T4TOTAL", "FREET4", "T3FREE", "THYROIDAB"  in the last 72 hours. Lipid Profile: No results for input(s): "CHOL", "HDL", "LDLCALC", "TRIG", "CHOLHDL", "LDLDIRECT" in the last 72 hours. Anemia Panel: No results for input(s): "VITAMINB12", "FOLATE", "FERRITIN", "TIBC", "IRON", "RETICCTPCT" in the last 72 hours. Urine analysis:    Component Value Date/Time   COLORURINE YELLOW 08/07/2023 0318   APPEARANCEUR CLEAR 08/07/2023 0318   LABSPEC 1.036 (H) 08/07/2023 0318   PHURINE 6.0 08/07/2023 0318   GLUCOSEU >=500 (A) 08/07/2023 0318   HGBUR NEGATIVE 08/07/2023 0318   BILIRUBINUR NEGATIVE 08/07/2023 0318   KETONESUR NEGATIVE 08/07/2023 0318   PROTEINUR 30 (A) 08/07/2023 0318   NITRITE NEGATIVE 08/07/2023 0318   LEUKOCYTESUR NEGATIVE 08/07/2023 0318   Sepsis Labs: Invalid input(s): "PROCALCITONIN", "LACTICIDVEN"  Microbiology: Recent Results (from the past 240 hours)  Resp panel by RT-PCR (RSV, Flu A&B, Covid) Anterior Nasal Swab     Status: None   Collection Time: 08/06/23  4:07 PM   Specimen: Anterior Nasal Swab  Result Value Ref Range Status   SARS Coronavirus 2 by RT PCR NEGATIVE NEGATIVE Final   Influenza A by PCR NEGATIVE NEGATIVE Final   Influenza B by PCR NEGATIVE NEGATIVE Final    Comment: (NOTE) The Xpert Xpress SARS-CoV-2/FLU/RSV plus assay is intended as an aid in the diagnosis of influenza from Nasopharyngeal swab specimens and should not be used as a sole basis for treatment. Nasal washings and aspirates are unacceptable for Xpert Xpress SARS-CoV-2/FLU/RSV testing.  Fact Sheet for Patients: BloggerCourse.com  Fact Sheet for Healthcare Providers: SeriousBroker.it  This test is not yet approved or cleared by the [place] FDA and has been authorized for detection and/or diagnosis of SARS-CoV-2 by FDA under an Emergency Use Authorization (EUA). This EUA will remain in effect (meaning this test can be used) for the  duration of the COVID-19 declaration under Section 564(b)(1) of the Act, 21 U.S.C. section 360bbb-3(b)(1), unless the authorization is terminated or revoked.     Resp Syncytial Virus by PCR NEGATIVE NEGATIVE Final    Comment: (NOTE) Fact Sheet for Patients: BloggerCourse.com  Fact Sheet for Healthcare Providers: SeriousBroker.it  This test is not yet approved or cleared by the [place] FDA and has been authorized for detection and/or diagnosis of SARS-CoV-2 by FDA under an Emergency Use Authorization (EUA). This EUA will remain in effect (meaning this test can be used) for the duration of the COVID-19 declaration under Section 564(b)(1) of the Act, 21 U.S.C. section 360bbb-3(b)(1), unless the authorization is terminated or revoked.  Performed at [facility], [address] [place], [place] [address]   Blood culture (routine x 2)     Status: None (Preliminary result)   Collection Time: 08/06/23  5:00 PM   Specimen: BLOOD  Result Value Ref Range Status   Specimen Description BLOOD SITE NOT SPECIFIED  Final   Special Requests   Final    BOTTLES DRAWN AEROBIC AND ANAEROBIC Blood Culture adequate volume   Culture   Final    NO GROWTH < 12 HOURS Performed at [facility], [address], [place], [place] [address]    Report Status PENDING  Incomplete  Blood culture (routine x 2)     Status: None (Preliminary result)   Collection Time: 08/06/23  5:20 PM   Specimen: BLOOD  Result Value Ref Range Status   Specimen Description BLOOD SITE NOT SPECIFIED  Final   Special Requests   Final    BOTTLES DRAWN AEROBIC AND ANAEROBIC Blood Culture adequate volume  Culture   Final    NO GROWTH < 12 HOURS Performed at [facility], [address] [place], [place] [address]    Report Status PENDING  Incomplete    Radiology Studies: DG Chest Port 1 View Result Date: 08/07/2023 CLINICAL DATA:  [id number] with shortness  of breath, pneumonia versus edema, and respiratory distress. EXAM: PORTABLE CHEST 1 VIEW COMPARISON:  Portable chest yesterday at 4:36 p.m. FINDINGS: 6:13 a.m. The heart is moderately enlarged. A leadless pacemaker in right ventricle is again shown. The mediastinum is stable.  There is aortic atherosclerosis. Mild perihilar vascular congestion persists as well as mild basilar interstitial edema with small pleural effusions. Scattered airspace disease in both bases has partially cleared since yesterday, most likely clearing edema. Remaining lungs clear with COPD change. No other changes in the overall aeration. IMPRESSION: 1. Cardiomegaly with mild perihilar vascular congestion and mild basilar interstitial edema with small pleural effusions. 2. Scattered airspace disease in both bases has partially cleared since yesterday, most likely clearing edema. 3. COPD. 4. Aortic atherosclerosis. Electronically Signed   By: Almira Bar M.D.   On: 08/07/2023 06:25   CT ABDOMEN PELVIS W CONTRAST Result Date: 08/06/2023 CLINICAL DATA:  Abdominal pain EXAM: CT ABDOMEN AND PELVIS WITH CONTRAST TECHNIQUE: Multidetector CT imaging of the abdomen and pelvis was performed using the standard protocol following bolus administration of intravenous contrast. RADIATION DOSE REDUCTION: This exam was performed according to the departmental dose-optimization program which includes automated exposure control, adjustment of the mA and/or kV according to patient size and/or use of iterative reconstruction technique. CONTRAST:  75mL OMNIPAQUE IOHEXOL 350 MG/ML SOLN COMPARISON:  04/22/2023 FINDINGS: Lower chest: Bibasilar atelectasis or infiltrates. See chest CT report. Hepatobiliary: Liver is enlarged and low density compatible with fatty infiltration. Nodular contours compatible with cirrhosis. Findings stable since prior study. No focal hepatic abnormality. Gallbladder unremarkable. Pancreas: No focal abnormality or ductal dilatation.  Spleen: No focal abnormality.  Normal size. Adrenals/Urinary Tract: No adrenal abnormality. No focal renal abnormality. No stones or hydronephrosis. Urinary bladder is unremarkable. Stomach/Bowel: Prior colectomy. Right lower quadrant ileostomy. Stomach and small bowel decompressed. No bowel obstruction or inflammatory process. Vascular/Lymphatic: Diffuse aortoiliac atherosclerosis. No evidence of aneurysm or adenopathy. Reproductive: No visible focal abnormality. Other: Small amount of perihepatic ascites.  No free air. Musculoskeletal: No acute bony abnormality. IMPRESSION: Changes of fatty liver, hepatomegaly and cirrhosis, stable. Small amount of perihepatic ascites. 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 No bowel obstruction. Aortoiliac atherosclerosis. No acute findings. Electronically Signed   By: Charlett Nose M.D.   On: 08/06/2023 20:23   CT Angio Chest PE W and/or Wo Contrast Result Date: 08/06/2023 CLINICAL DATA:  Pulmonary embolism (PE) suspected, high prob. Sob, hypoxia, hx of PE not anticoagulated, abd pain, abd wound; EXAM: CT ANGIOGRAPHY CHEST WITH CONTRAST TECHNIQUE: Multidetector CT imaging of the chest was performed using the standard protocol during bolus administration of intravenous contrast. Multiplanar CT image reconstructions and MIPs were obtained to evaluate the vascular anatomy. RADIATION DOSE REDUCTION: This exam was performed according to the departmental dose-optimization program which includes automated exposure control, adjustment of the mA and/or kV according to patient size and/or use of iterative reconstruction technique. CONTRAST:  75mL OMNIPAQUE IOHEXOL 350 MG/ML SOLN COMPARISON:  12/23/2022 FINDINGS: Cardiovascular: Cardiomegaly. Coronary artery and aortic atherosclerosis. No filling defects in the pulmonary arteries to suggest pulmonary emboli. Mediastinum/Nodes: No mediastinal, hilar, or axillary adenopathy. Trachea and esophagus are  unremarkable. Thyroid unremarkable. Lungs/Pleura: Peribronchial thickening  diffusely, most pronounced in the lower lobes and similar to prior study. Bibasilar airspace opacities could reflect atelectasis or pneumonia. No effusions. Upper Abdomen: No acute findings Musculoskeletal: Chest wall soft tissues are unremarkable. No acute bony abnormality. Review of the MIP images confirms the above findings. IMPRESSION: No evidence of pulmonary embolus. Cardiomegaly, coronary artery disease. Bronchial wall thickening compatible with bronchitis. This is similar to prior study. Continued bibasilar airspace opacities, atelectasis versus pneumonia. Opacities less pronounced than prior study. Aortic Atherosclerosis (ICD10-I70.0). Electronically Signed   By: Charlett Nose M.D.   On: 08/06/2023 20:20   DG Chest Portable 1 View Result Date: 08/06/2023 CLINICAL DATA:  Shortness of breath and hypoxia EXAM: PORTABLE CHEST 1 VIEW COMPARISON:  Chest x-ray 02/17/2023. FINDINGS: There some patchy and interstitial opacities in both lung bases. The cardiomediastinal silhouette is stable, the heart is mildly enlarged. There is no pneumothorax or acute fracture. IMPRESSION: Patchy and interstitial opacities in both lung bases, concerning for pneumonia versus edema. Electronically Signed   By: Darliss Cheney M.D.   On: 08/06/2023 17:06      Zameria Vogl T. Helena Sardo [facility]  If 7PM-7AM, please contact night-coverage [email or web address] 08/07/2023, 10:56 AM

## 2023-08-08 ENCOUNTER — Ambulatory Visit (HOSPITAL_COMMUNITY): Payer: Medicaid Other | Admitting: Nurse Practitioner

## 2023-08-08 ENCOUNTER — Inpatient Hospital Stay (HOSPITAL_COMMUNITY): Payer: Medicaid Other

## 2023-08-08 DIAGNOSIS — R0602 Shortness of breath: Secondary | ICD-10-CM | POA: Diagnosis not present

## 2023-08-08 DIAGNOSIS — I517 Cardiomegaly: Secondary | ICD-10-CM | POA: Diagnosis not present

## 2023-08-08 DIAGNOSIS — J189 Pneumonia, unspecified organism: Secondary | ICD-10-CM | POA: Diagnosis not present

## 2023-08-08 DIAGNOSIS — R0989 Other specified symptoms and signs involving the circulatory and respiratory systems: Secondary | ICD-10-CM | POA: Diagnosis not present

## 2023-08-08 DIAGNOSIS — R918 Other nonspecific abnormal finding of lung field: Secondary | ICD-10-CM | POA: Diagnosis not present

## 2023-08-08 LAB — CBC
HCT: 37.2 % — ABNORMAL LOW (ref 39.0–52.0)
Hemoglobin: 12.3 g/dL — ABNORMAL LOW (ref 13.0–17.0)
MCH: 34.2 pg — ABNORMAL HIGH (ref 26.0–34.0)
MCHC: 33.1 g/dL (ref 30.0–36.0)
MCV: 103.3 fL — ABNORMAL HIGH (ref 80.0–100.0)
Platelets: 198 10*3/uL (ref 150–400)
RBC: 3.6 MIL/uL — ABNORMAL LOW (ref 4.22–5.81)
RDW: 15.1 % (ref 11.5–15.5)
WBC: 13.4 10*3/uL — ABNORMAL HIGH (ref 4.0–10.5)
nRBC: 0.1 % (ref 0.0–0.2)

## 2023-08-08 LAB — COMPREHENSIVE METABOLIC PANEL
ALT: 30 U/L (ref 0–44)
AST: 32 U/L (ref 15–41)
Albumin: 3.7 g/dL (ref 3.5–5.0)
Alkaline Phosphatase: 111 U/L (ref 38–126)
Anion gap: 7 (ref 5–15)
BUN: 40 mg/dL — ABNORMAL HIGH (ref 8–23)
CO2: 23 mmol/L (ref 22–32)
Calcium: 8.2 mg/dL — ABNORMAL LOW (ref 8.9–10.3)
Chloride: 100 mmol/L (ref 98–111)
Creatinine, Ser: 1.31 mg/dL — ABNORMAL HIGH (ref 0.61–1.24)
GFR, Estimated: >60 mL/min (ref >60)
Glucose, Bld: 324 mg/dL — ABNORMAL HIGH (ref 70–99)
Potassium: 5.2 mmol/L — ABNORMAL HIGH (ref 3.5–5.1)
Sodium: 130 mmol/L — ABNORMAL LOW (ref 135–145)
Total Bilirubin: 0.7 mg/dL (ref 0.0–1.2)
Total Protein: 7.5 g/dL (ref 6.5–8.1)

## 2023-08-08 LAB — C-REACTIVE PROTEIN: CRP: 3.8 mg/dL — ABNORMAL HIGH (ref <1.0)

## 2023-08-08 LAB — BRAIN NATRIURETIC PEPTIDE: B Natriuretic Peptide: 527.4 pg/mL — ABNORMAL HIGH (ref 0.0–100.0)

## 2023-08-08 LAB — PROCALCITONIN: Procalcitonin: 4.53 ng/mL

## 2023-08-08 LAB — MAGNESIUM: Magnesium: 2.6 mg/dL — ABNORMAL HIGH (ref 1.7–2.4)

## 2023-08-08 LAB — PHOSPHORUS: Phosphorus: 3.9 mg/dL (ref 2.5–4.6)

## 2023-08-08 LAB — GLUCOSE, CAPILLARY
Glucose-Capillary: 297 mg/dL — ABNORMAL HIGH (ref 70–99)
Glucose-Capillary: 262 mg/dL — ABNORMAL HIGH (ref 70–99)
Glucose-Capillary: 260 mg/dL — ABNORMAL HIGH (ref 70–99)
Glucose-Capillary: 339 mg/dL — ABNORMAL HIGH (ref 70–99)

## 2023-08-08 LAB — MRSA NEXT GEN BY PCR, NASAL: MRSA by PCR Next Gen: DETECTED — AB

## 2023-08-08 MED ORDER — PHENOL 1.4 % MT LIQD
1.0000 | OROMUCOSAL | Status: DC | PRN
  Administered 2023-08-08: 1 via OROMUCOSAL
  Filled 2023-08-08: qty 177

## 2023-08-08 MED ORDER — LORAZEPAM 2 MG/ML IJ SOLN: 0.0000 mg | Freq: Four times a day (QID) | INTRAMUSCULAR | Status: DC

## 2023-08-08 MED ORDER — METHYLPREDNISOLONE SODIUM SUCC 125 MG IJ SOLR
60.0000 mg | Freq: Every day | INTRAMUSCULAR | Status: DC
  Administered 2023-08-08: 60 mg via INTRAVENOUS
  Filled 2023-08-08: qty 2

## 2023-08-08 MED ORDER — LORAZEPAM 1 MG PO TABS: 1.0000 mg | ORAL_TABLET | ORAL | Status: AC | PRN

## 2023-08-08 MED ORDER — INSULIN ASPART 100 UNIT/ML IJ SOLN
6.0000 [IU] | Freq: Three times a day (TID) | INTRAMUSCULAR | Status: DC
Start: 2023-08-08 — End: 2023-08-14
  Administered 2023-08-08 – 2023-08-14 (×17): 6 [IU] via SUBCUTANEOUS

## 2023-08-08 MED ORDER — LORAZEPAM 2 MG/ML IJ SOLN
0.0000 mg | Freq: Two times a day (BID) | INTRAMUSCULAR | Status: DC
Start: 2023-08-10 — End: 2023-08-08

## 2023-08-08 MED ORDER — INSULIN GLARGINE 100 UNIT/ML ~~LOC~~ SOLN
25.0000 [IU] | Freq: Every day | SUBCUTANEOUS | Status: DC
  Administered 2023-08-08 – 2023-08-10 (×3): 25 [IU] via SUBCUTANEOUS
  Filled 2023-08-08 (×4): qty 0.25

## 2023-08-08 MED ORDER — LORAZEPAM 2 MG/ML IJ SOLN
0.0000 mg | Freq: Four times a day (QID) | INTRAMUSCULAR | Status: DC
  Administered 2023-08-09: 1 mg via INTRAVENOUS
  Administered 2023-08-09 (×2): 2 mg via INTRAVENOUS
  Filled 2023-08-08 (×3): qty 1

## 2023-08-08 MED ORDER — IPRATROPIUM-ALBUTEROL 0.5-2.5 (3) MG/3ML IN SOLN: 3.0000 mL | Freq: Four times a day (QID) | RESPIRATORY_TRACT | Status: DC

## 2023-08-08 MED ORDER — IPRATROPIUM-ALBUTEROL 0.5-2.5 (3) MG/3ML IN SOLN
3.0000 mL | Freq: Four times a day (QID) | RESPIRATORY_TRACT | Status: DC
  Administered 2023-08-08 – 2023-08-10 (×9): 3 mL via RESPIRATORY_TRACT
  Filled 2023-08-08 (×10): qty 3

## 2023-08-08 MED ORDER — ADULT MULTIVITAMIN W/MINERALS CH
1.0000 | ORAL_TABLET | Freq: Every day | ORAL | Status: DC
Start: 2023-08-08 — End: 2023-08-14
  Administered 2023-08-08 – 2023-08-14 (×6): 1 via ORAL
  Filled 2023-08-08 (×7): qty 1

## 2023-08-08 MED ORDER — LORAZEPAM 2 MG/ML IJ SOLN
0.0000 mg | Freq: Two times a day (BID) | INTRAMUSCULAR | Status: DC
  Administered 2023-08-10: 2 mg via INTRAVENOUS
  Filled 2023-08-08: qty 1

## 2023-08-08 MED ORDER — METHYLPREDNISOLONE SODIUM SUCC 125 MG IJ SOLR: 80.0000 mg | Freq: Every day | INTRAMUSCULAR | Status: DC

## 2023-08-08 MED ORDER — SALINE SPRAY 0.65 % NA SOLN
1.0000 | NASAL | Status: DC | PRN
  Administered 2023-08-08: 1 via NASAL
  Filled 2023-08-08: qty 44

## 2023-08-08 MED ORDER — SODIUM CHLORIDE 0.9 % IV SOLN
3.0000 g | Freq: Three times a day (TID) | INTRAVENOUS | Status: DC
  Administered 2023-08-08 – 2023-08-14 (×18): 3 g via INTRAVENOUS
  Filled 2023-08-08 (×18): qty 8

## 2023-08-08 MED ORDER — LORAZEPAM 2 MG/ML IJ SOLN
1.0000 mg | INTRAMUSCULAR | Status: AC | PRN
  Administered 2023-08-10: 1 mg via INTRAVENOUS
  Administered 2023-08-10 – 2023-08-11 (×2): 2 mg via INTRAVENOUS
  Filled 2023-08-08 (×3): qty 1

## 2023-08-08 MED ORDER — SODIUM ZIRCONIUM CYCLOSILICATE 10 G PO PACK
10.0000 g | PACK | Freq: Two times a day (BID) | ORAL | Status: AC
  Administered 2023-08-08 (×2): 10 g via ORAL
  Filled 2023-08-08 (×2): qty 1

## 2023-08-08 NOTE — Plan of Care (Signed)

## 2023-08-08 NOTE — Progress Notes (Signed)
 Patients sats sustaining @ 86- 87% with 5L HFNC and thus having  difficult to catch his breath. So, increase to 6L HFNC

## 2023-08-08 NOTE — Inpatient Diabetes Management (Signed)
 Inpatient Diabetes Program Recommendations  AACE/ADA: New Consensus Statement on Inpatient Glycemic Control (2015)  Target Ranges:  Prepandial:   less than 140 mg/dL      Peak postprandial:   less than 180 mg/dL (1-2 hours)      Critically ill patients:  140 - 180 mg/dL   Lab Results  Component Value Date   GLUCAP 297 (H) 08/08/2023   HGBA1C 7.3 (H) 08/06/2023    Review of Glycemic Control  Latest Reference Range & Units 08/07/23 08:00 08/07/23 12:00 08/07/23 17:43 08/07/23 21:19 08/08/23 08:21  Glucose-Capillary 70 - 99 mg/dL 191 (H) 478 (H) 295 (H) 329 (H) 297 (H)  (H): Data is abnormally high  Diabetes history: DM2 Outpatient Diabetes medications: Farxiga 10 mg QD Current orders for Inpatient glycemic control: Semglee 15 units every day, Novolog 0-15 units TID and 0-5 units at bedtime, Novolog 4 units TID, Solumedrol 80 mg QD  Inpatient Diabetes Program Recommendations:    If steroids continue, Might consider:  Semglee 12 units BID.  Will continue to follow while inpatient.  Thank you, Dulce Sellar, MSN, CDCES Diabetes Coordinator Inpatient Diabetes Program [phone] (team pager from 8a-5p)

## 2023-08-08 NOTE — Evaluation (Signed)
 Occupational Therapy Evaluation Patient Details Name: Luis [name] MRN: [id number] DOB: 1961/03/17 Today's Date: 08/08/2023   History of Present Illness    63 year old M presenting with progressive shortness of breath for about a week, productive cough for 2 to 3 days, fever, wheezing and orthopnea, and admitted with working diagnosis of acute hypoxic respiratory failure in the setting of community-acquired pneumonia and COPD exacerbation. PMH of COPD, diastolic CHF, CHB/PPM, DM-2, ileostomy, chronic abdominal wound and tobacco use      Clinical Impressions PTA, pt lives alone and typically Independent with ADLs, IADLs and mobility without AD. Pt presents now with deficits in cardiopulmonary endurance and strength. Pt opting to use RW for energy conservation and balance in room. Pt requires overall Supervision-Min A for ADLs and Supervision-CGA for mobility. Anticipate good progress towards OT goals acutely to return home without need for OT follow up.      If plan is discharge home, recommend the following:   Other (comment) (PRN)     Functional Status Assessment   Patient has had a recent decline in their functional status and demonstrates the ability to make significant improvements in function in a reasonable and predictable amount of time.     Equipment Recommendations   None recommended by OT     Recommendations for Other Services         Precautions/Restrictions   Precautions Precautions: Fall Recall of Precautions/Restrictions: Intact Precaution/Restrictions Comments: watch O2 (does not wear at baseline), ostomy Restrictions Weight Bearing Restrictions Per Provider Order: No     Mobility Bed Mobility Overal bed mobility: Modified Independent                  Transfers Overall transfer level: Needs assistance Equipment used: Rolling walker (2 wheels) Transfers: Sit to/from Stand Sit to Stand: Supervision                  Balance  Overall balance assessment: Needs assistance Sitting-balance support: No upper extremity supported, Feet supported Sitting balance-Leahy Scale: Good     Standing balance support: Bilateral upper extremity supported, During functional activity Standing balance-Leahy Scale: Fair                             ADL either performed or assessed with clinical judgement   ADL Overall ADL's : Needs assistance/impaired Eating/Feeding: Independent   Grooming: Supervision/safety;Standing   Upper Body Bathing: Supervision/ safety   Lower Body Bathing: Sit to/from stand;Sitting/lateral leans;Supervison/ safety   Upper Body Dressing : Supervision/safety;Sitting   Lower Body Dressing: Minimal assistance;Sitting/lateral leans;Sit to/from stand Lower Body Dressing Details (indicate cue type and reason): assist with socks, reports not really wearing socks at baseline Toilet Transfer: Supervision/safety;Ambulation;Rolling walker (2 wheels)   Toileting- Clothing Manipulation and Hygiene: Supervision/safety;Sitting/lateral lean;Sit to/from stand       Functional mobility during ADLs: Supervision/safety;Rolling walker (2 wheels)       Vision Baseline Vision/History: 1 Wears glasses Ability to See in Adequate Light: 0 Adequate Patient Visual Report: No change from baseline Vision Assessment?: No apparent visual deficits     Perception         Praxis         Pertinent Vitals/Pain Pain Assessment Pain Assessment: No/denies pain     Extremity/Trunk Assessment Upper Extremity Assessment Upper Extremity Assessment: Overall WFL for tasks assessed;Right hand dominant   Lower Extremity Assessment Lower Extremity Assessment: Defer to PT evaluation   Cervical / Trunk Assessment Cervical /  Trunk Assessment: Other exceptions Cervical / Trunk Exceptions: large wound/dressing on abdomen; has been going to outpatient wound care   Communication Communication Communication: No  apparent difficulties   Cognition Arousal: Alert Behavior During Therapy: WFL for tasks assessed/performed, Restless Cognition: No family/caregiver present to determine baseline             OT - Cognition Comments: likely at baseline cognitively but not formally assessed. pt follows directions, shows insight into deficits but hyperverbose and sarcastic at times                 Following commands: Intact       Cueing  General Comments   Cueing Techniques: Verbal cues      Exercises     Shoulder Instructions      Home Living Family/patient expects to be discharged to:: Private residence Living Arrangements: Alone Available Help at Discharge: Other (Comment) (Pt reports no help available) Type of Home: House Home Access: Level entry     Home Layout: Two level;1/2 bath on main level;Able to live on main level with bedroom/bathroom Alternate Level Stairs-Number of Steps: flight Alternate Level Stairs-Rails: Right Bathroom Shower/Tub: Sponge bathes at baseline   Bathroom Toilet: Handicapped height     Home Equipment: Rollator (4 wheels)   Additional Comments: reports he had a shower chair but it didnt fit in current shower      Prior Functioning/Environment Prior Level of Function : Independent/Modified Independent             Mobility Comments: Intermittently holds onto furniture ADLs Comments: states that he is Ind with ADLs, IADLs, drives    OT Problem List: Cardiopulmonary status limiting activity;Decreased activity tolerance;Impaired balance (sitting and/or standing)   OT Treatment/Interventions: Self-care/ADL training;Therapeutic exercise;Energy conservation;DME and/or AE instruction;Therapeutic activities;Patient/family education;Balance training      OT Goals(Current goals can be found in the care plan section)   Acute Rehab OT Goals Patient Stated Goal: not need O2 OT Goal Formulation: With patient Time For Goal Achievement:  08/22/23 Potential to Achieve Goals: Good   OT Frequency:  Min 1X/week    Co-evaluation              AM-PAC OT "6 Clicks" Daily Activity     Outcome Measure Help from another person eating meals?: None Help from another person taking care of personal grooming?: A Little Help from another person toileting, which includes using toliet, bedpan, or urinal?: A Little Help from another person bathing (including washing, rinsing, drying)?: A Little Help from another person to put on and taking off regular upper body clothing?: A Little Help from another person to put on and taking off regular lower body clothing?: A Little 6 Click Score: 19   End of Session Equipment Utilized During Treatment: Rolling walker (2 wheels);Oxygen Nurse Communication: Mobility status  Activity Tolerance: Patient tolerated treatment well Patient left: in bed;with call bell/phone within reach  OT Visit Diagnosis: Other abnormalities of gait and mobility (R26.89);Muscle weakness (generalized) (M62.81)                Time: 1191-4782 OT Time Calculation (min): 21 min Charges:  OT General Charges $OT Visit: 1 Visit OT Evaluation $OT Eval Low Complexity: 1 Low  Bradd Canary, OTR/L Acute Rehab Services Office: [phone]   Lorre Munroe 08/08/2023, 10:27 AM

## 2023-08-08 NOTE — Evaluation (Addendum)
 Clinical/Bedside Swallow Evaluation Patient Details  Name: Luis [name] MRN: [id number] Date of Birth: Jun 28, 1960  Today's Date: 08/08/2023 Time: SLP Start Time (ACUTE ONLY): 1005 SLP Stop Time (ACUTE ONLY): 1018 SLP Time Calculation (min) (ACUTE ONLY): 13 min  Past Medical History:  Past Medical History:  Diagnosis Date   Acute respiratory failure with hypoxia (HCC) 12/23/2022   Alcohol withdrawal syndrome, with delirium (HCC) 12/27/2022   COPD (chronic obstructive pulmonary disease) (HCC)    Diabetes mellitus without complication (HCC)    Diverticulitis    DVT (deep venous thrombosis) (HCC)    x3   Encephalopathy acute 12/28/2022   ETOH abuse    H/O colectomy    Hyperlipidemia    Hypertension    Sepsis associated hypotension (HCC) 08/03/2019   Smoker    Past Surgical History:  Past Surgical History:  Procedure Laterality Date   COLOSTOMY     PACEMAKER LEADLESS INSERTION N/A 12/31/2020   Procedure: PACEMAKER LEADLESS INSERTION;  Surgeon: [name], MD;  Location: [facility];  Service: Cardiovascular;  Laterality: N/A;   TONSILLECTOMY     HPI:  Luis [name] is a 63 yo male presenting to ED 2/23 with one week history of progressive shortness of breath, productive cough, wheezing, and orthopnea. Admitted with acute hypoxic respiratory failure in the setting of CAP and COPD exacerbation. Seen by SLP 12/25/22 with recommendations to continue regular diet. PMH includes COPD, diastolic CHF, CHB/PPM, T2DM, ileostomy, chronic abdominal wound, tobacco/alcohol use  .    Assessment / Plan / Recommendation  Clinical Impression  Pt reports recurring pneumonia in the past year in addition to changes in his vocal quality. He is currently requiring 4L O2 via HFNC. There were no signs clinically concerning for aspiration with sequential sips of thin liquids. He denies a history of reflux or esophageal difficulty, although had frequent throat clearance following solids. Discussed  option to complete MBS to gain more information, with which pt is agreeable. SLP will f/u to complete MBS as able (next date at the earliest per radiology). Pending results, pt may continue his current diet of regular solids with thin liquids. SLP Visit Diagnosis: Dysphagia, unspecified (R13.10)    Aspiration Risk  Mild aspiration risk    Diet Recommendation Regular;Thin liquid    Liquid Administration via: Cup;Straw Medication Administration: Whole meds with liquid Supervision: Patient able to self feed Compensations: Slow rate;Small sips/bites Postural Changes: Seated upright at 90 degrees    Other  Recommendations Oral Care Recommendations: Oral care BID    Recommendations for follow up therapy are one component of a multi-disciplinary discharge planning process, led by the attending physician.  Recommendations may be updated based on patient status, additional functional criteria and insurance authorization.  Follow up Recommendations Other (comment) (TBD pending MBS results)      Assistance Recommended at Discharge    Functional Status Assessment Patient has had a recent decline in their functional status and demonstrates the ability to make significant improvements in function in a reasonable and predictable amount of time.  Frequency and Duration min 2x/week  1 week       Prognosis Prognosis for improved oropharyngeal function: Good Barriers to Reach Goals: Time post onset      Swallow Study   General HPI: Luis [name] is a 63 yo male presenting to ED 2/23 with one week history of progressive shortness of breath, productive cough, wheezing, and orthopnea. Admitted with acute hypoxic respiratory failure in the setting of CAP and COPD exacerbation.  Seen by SLP 12/25/22 with recommendations to continue regular diet. PMH includes COPD, diastolic CHF, CHB/PPM, T2DM, ileostomy, chronic abdominal wound, tobacco/alcohol use  . Type of Study: Bedside Swallow Evaluation Previous  Swallow Assessment: see HPI Diet Prior to this Study: Regular;Thin liquids (Level 0) Temperature Spikes Noted: No Respiratory Status: Nasal cannula (HFNC) History of Recent Intubation: No Behavior/Cognition: Alert;Cooperative Oral Cavity Assessment: Within Functional Limits Oral Care Completed by SLP: No Oral Cavity - Dentition: Adequate natural dentition Vision: Functional for self-feeding Self-Feeding Abilities: Able to feed self Patient Positioning: Upright in bed Baseline Vocal Quality: Hoarse Volitional Cough: Strong Volitional Swallow: Able to elicit    Oral/Motor/Sensory Function Overall Oral Motor/Sensory Function: Within functional limits   Ice Chips Ice chips: Not tested   Thin Liquid Thin Liquid: Within functional limits Presentation: Straw;Self Fed    Nectar Thick Nectar Thick Liquid: Not tested   Honey Thick Honey Thick Liquid: Not tested   Puree Puree: Not tested   Solid     Solid: Impaired Presentation: Self Fed Pharyngeal Phase Impairments: Throat Clearing - Immediate      Gwynneth Aliment, M.A., CF-SLP Speech Language Pathology, Acute Rehabilitation Services  Secure Chat preferred [phone]  08/08/2023,10:37 AM

## 2023-08-08 NOTE — Plan of Care (Signed)
  Problem: Coping: Goal: Ability to adjust to condition or change in health will improve Outcome: Progressing   Problem: Health Behavior/Discharge Planning: Goal: Ability to manage health-related needs will improve Outcome: Progressing   Problem: Clinical Measurements: Goal: Respiratory complications will improve Outcome: Progressing   Problem: Activity: Goal: Risk for activity intolerance will decrease Outcome: Progressing   Problem: Coping: Goal: Level of anxiety will decrease Outcome: Progressing

## 2023-08-08 NOTE — Evaluation (Signed)
 Physical Therapy Evaluation Patient Details Name: Luis [name] MRN: [id number] DOB: 1960/06/26 Today's Date: 08/08/2023  History of Present Illness  63 year old M presenting with progressive shortness of breath for about a week, productive cough for 2 to 3 days, fever, wheezing and orthopnea, and admitted with working diagnosis of acute hypoxic respiratory failure in the setting of community-acquired pneumonia and COPD exacerbation. PMH of COPD, diastolic CHF, CHB/PPM, DM-2, ileostomy, chronic abdominal wound and tobacco use  Clinical Impression  Pt presents with admitting diagnosis above. Co-treat with OT. Pt today was agreeable to ambulate in room with RW at supervision level. Pt refused to go out in hallway and was fixated on taking a nap. PTA pt reports he was Mod I furniture surfing occasionally. Pt appears to be fairly close to baseline and anticipate that pt likely will not need any follow up PT upon DC however PT will continue to follow acutely.         If plan is discharge home, recommend the following: A little help with walking and/or transfers;A little help with bathing/dressing/bathroom;Assistance with cooking/housework;Assist for transportation;Help with stairs or ramp for entrance   Can travel by private vehicle        Equipment Recommendations None recommended by PT  Recommendations for Other Services       Functional Status Assessment Patient has had a recent decline in their functional status and demonstrates the ability to make significant improvements in function in a reasonable and predictable amount of time.     Precautions / Restrictions Precautions Precautions: Fall Recall of Precautions/Restrictions: Intact Precaution/Restrictions Comments: watch O2 (does not wear at baseline), ostomy Restrictions Weight Bearing Restrictions Per Provider Order: No      Mobility  Bed Mobility Overal bed mobility: Modified Independent                   Transfers Overall transfer level: Needs assistance Equipment used: Rolling walker (2 wheels) Transfers: Sit to/from Stand Sit to Stand: Supervision                Ambulation/Gait Ambulation/Gait assistance: Supervision Gait Distance (Feet): 15 Feet Assistive device: Rolling walker (2 wheels) Gait Pattern/deviations: Trunk flexed, Decreased stride length, Step-through pattern Gait velocity: decreased     General Gait Details: Pt agreeable to ambulate to the door and back with RW. Refused to go out in hallway. no LOB noted.  Stairs            Wheelchair Mobility     Tilt Bed    Modified Rankin (Stroke Patients Only)       Balance Overall balance assessment: Needs assistance Sitting-balance support: No upper extremity supported, Feet supported Sitting balance-Leahy Scale: Good     Standing balance support: Bilateral upper extremity supported, During functional activity Standing balance-Leahy Scale: Fair                               Pertinent Vitals/Pain Pain Assessment Pain Assessment: No/denies pain    Home Living Family/patient expects to be discharged to:: Private residence Living Arrangements: Alone Available Help at Discharge: Other (Comment) (Pt reports no help available) Type of Home: House Home Access: Level entry     Alternate Level Stairs-Number of Steps: flight Home Layout: Two level;1/2 bath on main level;Able to live on main level with bedroom/bathroom Home Equipment: Rollator (4 wheels) Additional Comments: reports he had a shower chair but it didnt fit in current shower  Prior Function Prior Level of Function : Independent/Modified Independent             Mobility Comments: Intermittently holds onto furniture ADLs Comments: states that he is Ind with ADLs, IADLs, drives     Extremity/Trunk Assessment   Upper Extremity Assessment Upper Extremity Assessment: Overall WFL for tasks assessed    Lower  Extremity Assessment Lower Extremity Assessment: Overall WFL for tasks assessed    Cervical / Trunk Assessment Cervical / Trunk Assessment: Other exceptions Cervical / Trunk Exceptions: large wound/dressing on abdomen; has been going to outpatient wound care  Communication   Communication Communication: No apparent difficulties    Cognition Arousal: Alert Behavior During Therapy: WFL for tasks assessed/performed, Restless                           PT - Cognition Comments: Can be impulsive and tangential at times. Following commands: Intact       Cueing Cueing Techniques: Verbal cues     General Comments General comments (skin integrity, edema, etc.): SpO2 88-92% on 4 L O2 at rest and with activity, 2/4 DOE. Pt inquiring about how to get assistance with home modifications (grab bars in shower, etc) Left [facility] brochure in pt's room and provided initial info about program    Exercises     Assessment/Plan    PT Assessment Patient needs continued PT services  PT Problem List Decreased strength;Decreased range of motion;Decreased activity tolerance;Decreased balance;Decreased mobility;Decreased coordination;Decreased cognition;Decreased knowledge of use of DME;Decreased safety awareness;Decreased knowledge of precautions;Cardiopulmonary status limiting activity       PT Treatment Interventions DME instruction;Gait training;Stair training;Functional mobility training;Therapeutic activities;Balance training;Therapeutic exercise;Cognitive remediation;Neuromuscular re-education;Patient/family education    PT Goals (Current goals can be found in the Care Plan section)  Acute Rehab PT Goals Patient Stated Goal: to go home PT Goal Formulation: With patient Time For Goal Achievement: 08/22/23 Potential to Achieve Goals: Good    Frequency Min 1X/week     Co-evaluation               AM-PAC PT "6 Clicks" Mobility  Outcome Measure Help needed turning  from your back to your side while in a flat bed without using bedrails?: None Help needed moving from lying on your back to sitting on the side of a flat bed without using bedrails?: None Help needed moving to and from a bed to a chair (including a wheelchair)?: None Help needed standing up from a chair using your arms (e.g., wheelchair or bedside chair)?: A Little Help needed to walk in hospital room?: A Little Help needed climbing 3-5 steps with a railing? : A Little 6 Click Score: 21    End of Session Equipment Utilized During Treatment: Gait belt;Oxygen Activity Tolerance: Patient tolerated treatment well Patient left: in bed;with call bell/phone within reach Nurse Communication: Mobility status PT Visit Diagnosis: Other abnormalities of gait and mobility (R26.89)    Time: 3086-5784 PT Time Calculation (min) (ACUTE ONLY): 20 min   Charges:   PT Evaluation $PT Eval Moderate Complexity: 1 Mod   PT General Charges $$ ACUTE PT VISIT: 1 Visit         Shela Nevin, PT, DPT Acute Rehab Services [phone]   [name] 08/08/2023, 12:28 PM

## 2023-08-08 NOTE — Progress Notes (Signed)
 Heart Failure Navigator Progress Note  Assessed for Heart & Vascular TOC clinic readiness.  Patient does not meet criteria due to Advanced. Heart FAilure Team patient of Dr. Gasper Lloyd.  Navigator will sign off at this time.   Rhae Hammock, BSN, Scientist, clinical (histocompatibility and immunogenetics) Only

## 2023-08-08 NOTE — Progress Notes (Signed)
 PROGRESS NOTE                                                                                                                                                                                                             Patient Demographics:    Luis [name], is a 63 y.o. male, DOB - 12-02-1960, MVH:[id number]  Outpatient Primary MD for the patient is [name], NP    LOS - 2  Admit date - 08/06/2023    Chief Complaint  Patient presents with   Shortness of Breath       Brief Narrative (HPI from H&P)   63 year old M with PMH of COPD, diastolic CHF, CHB/PPM, DM-2, ileostomy, chronic abdominal wound and tobacco use disorder presenting with progressive shortness of breath for about a week, productive cough for 2 to 3 days, fever, wheezing and orthopnea, and admitted with working diagnosis of acute hypoxic respiratory failure in the setting of community-acquired pneumonia and COPD exacerbation.     Subjective:    [name] today has, No headache, No chest pain, No abdominal pain - No Nausea, No new weakness tingling or numbness, improving cough and shortness of breath   Assessment  & Plan :    Acute respiratory failure with hypoxia due to pneumonia community-acquired versus aspiration, COPD exacerbation and forcible acute on chronic diastolic CHF EF 55% on echocardiogram few months ago.  he is an alcoholic and still consumes large amount of alcohol and continuously smokes, high risk for aspiration, did have leukocytosis and elevated procalcitonin, we will place him on Unasyn along with azithromycin, continue steroids and diuretics as needed, encouraged to sit in chair use I-S and flutter valve for pulmonary toiletry.  Advance activity and titrate down oxygen.  Clinically has improved, strictly counseled to quit alcohol and smoking.  Speech therapy to evaluate.    COPD exacerbation: As above, minimal wheezing start tapering  steroids.   Acute on chronic diastolic CHF: TTE in 12/2022 with LVEF of 50 to 55%, G2-DD.  He is on torsemide and Farxiga at home.  Minimal crackles, minimal orthopnea, for now continue Lasix as needed.  AKI: Baseline Cr 0.8-0.9 although his creatinine is 1.23 in 02/2023.   Uncontrolled hypertension: On beta-blocker and Imdur, monitor ARB held due to hyperkalemia and renal dysfunction.   Complete heart block s/p PPM: EKG with  V paced rhythm and flutter but rate controlled. Continue home Toprol-XL   Ileostomy/chronic abdominal wound: Goes to outpatient wound care. Consulted WOCN   Elevated troponin: Minimal elevation in non-ACS pattern, no chest pain, no further workup, likely demand mismatch from hypoxia.   Lactic acidosis: Likely due to AKI, respiratory failure and metformin.  Supportive care and monitor    Hyponatremia.  Hold further Lasix and monitor.    Hyperkalemia: Lokelma.    Alcohol abuse and tobacco use disorder: Been counseled to quit both, CIWA protocol and NicoDerm patch.   At risk for sleep apnea -Needs outpatient evaluation but has claustrophobia, current oxygen here.  Not on CPAP.   Obesity  Body mass index is 31.12 kg/m.  With PCP for weight loss.  NIDDM-2 with hyperglycemia: A1c 7.3%.  On metformin and Farxiga at home, CBGs elevated due to high-dose steroids, steroids being tapered down, insulin dose adjusted on 08/08/2023, monitor closely, CBGs can fall precipitously once steroids are withdrawn.   CBG (last 3)  Recent Labs    08/07/23 1743 08/07/23 2119 08/08/23 0821  GLUCAP 271* 329* 297*         Condition - Fair  Family Communication  :  None  Code Status :  Full  Consults  :  None  PUD Prophylaxis :    Procedures  :     CT Abd - Changes of fatty liver, hepatomegaly and cirrhosis, stable. Small amount of perihepatic ascites. 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 No bowel obstruction. Aortoiliac  atherosclerosis. No acute findings.   CT Chest - No evidence of pulmonary embolus. Cardiomegaly, coronary artery disease. Bronchial wall thickening compatible with bronchitis. This is similar to prior study. Continued bibasilar airspace opacities, atelectasis versus pneumonia. Opacities less pronounced than prior study. Aortic Atherosclerosis (ICD10-I70.0).      Disposition Plan  :    Status is: Inpatient  DVT Prophylaxis  :    enoxaparin (LOVENOX) injection 40 mg Start: 08/07/23 1100 SCDs Start: 08/06/23 2142  Lab Results  Component Value Date   PLT 198 08/08/2023    Diet :  Diet Order             Diet regular Room service appropriate? Yes; Fluid consistency: Thin  Diet effective now                    Inpatient Medications  Scheduled Meds:  amLODipine  10 mg Oral Daily   atorvastatin  10 mg Oral Daily   dapagliflozin propanediol  10 mg Oral QAC breakfast   enoxaparin (LOVENOX) injection  40 mg Subcutaneous Q24H   folic acid  1 mg Oral Daily   gabapentin  300 mg Oral BID   guaiFENesin  600 mg Oral BID   insulin aspart  0-15 Units Subcutaneous TID WC   insulin aspart  0-5 Units Subcutaneous QHS   insulin aspart  6 Units Subcutaneous TID WC   insulin glargine  25 Units Subcutaneous Daily   ipratropium-albuterol  3 mL Nebulization Q6H   isosorbide mononitrate  30 mg Oral Daily   loratadine  10 mg Oral Daily   LORazepam  0-4 mg Intravenous Q6H   Followed by   Melene Muller ON 08/10/2023] LORazepam  0-4 mg Intravenous Q12H   methylPREDNISolone (SOLU-MEDROL) injection  60 mg Intravenous Daily   metoprolol succinate  25 mg Oral Daily   multivitamin with minerals  1 tablet Oral Daily   nicotine  21 mg Transdermal Daily   sodium  zirconium cyclosilicate  10 g Oral BID   thiamine  100 mg Oral Daily   Continuous Infusions:  ampicillin-sulbactam (UNASYN) IV     azithromycin Stopped (08/07/23 1619)   PRN Meds:.acetaminophen **OR** acetaminophen, albuterol, antiseptic oral  rinse, benzonatate, LORazepam **OR** LORazepam, melatonin, ondansetron (ZOFRAN) IV, phenol, sodium chloride    Objective:   Vitals:   08/08/23 0454 08/08/23 0626 08/08/23 0641 08/08/23 0817  BP:      Pulse:      Resp:      Temp:      TempSrc:      SpO2:  93% 93% 91%  Weight: 111.7 kg     Height:        Wt Readings from Last 3 Encounters:  08/08/23 111.7 kg  07/17/23 107 kg  06/15/23 106.1 kg     Intake/Output Summary (Last 24 hours) at 08/08/2023 0905 Last data filed at 08/08/2023 0407 Gross per 24 hour  Intake --  Output 1400 ml  Net -1400 ml     Physical Exam  Awake Alert, No new F.N deficits, Normal affect [place].AT,PERRAL Supple Neck, No JVD,   Symmetrical Chest wall movement, Good air movement bilaterally, minimal wheezing RRR,No Gallops,Rubs or new Murmurs,  +ve B.Sounds, Abd Soft, with chronic midline abdominal incision under bandage with colostomy bag No Cyanosis, Clubbing or edema       Data Review:    Recent Labs  Lab 08/06/23 1650 08/07/23 0319 08/08/23 0427  WBC 10.8* 10.1 13.4*  HGB 12.7* 12.6* 12.3*  HCT 39.1 37.8* 37.2*  PLT 211 190 198  MCV 103.7* 102.2* 103.3*  MCH 33.7 34.1* 34.2*  MCHC 32.5 33.3 33.1  RDW 14.9 14.8 15.1  LYMPHSABS  --  0.4*  --   MONOABS  --  0.2  --   EOSABS  --  0.0  --   BASOSABS  --  0.0  --     Recent Labs  Lab 08/06/23 1650 08/06/23 1657 08/06/23 1930 08/06/23 2315 08/07/23 0319 08/07/23 1841 08/08/23 0425 08/08/23 0427  NA 133*  --   --   --  129* 131*  --  130*  K 5.0  --   --   --  6.0* 6.0*  --  5.2*  CL 93*  --   --   --  96* 97*  --  100  CO2 24  --   --   --  21* 23  --  23  ANIONGAP 16*  --   --   --  12 11  --  7  GLUCOSE 129*  --   --   --  255* 253*  --  324*  BUN 25*  --   --   --  28* 38*  --  40*  CREATININE 1.32*  --   --   --  1.39* 1.54*  --  1.31*  AST 32  --   --   --  31  --   --  32  ALT 29  --   --   --  31  --   --  30  ALKPHOS 118  --   --   --  110  --   --  111  BILITOT  0.9  --   --   --  1.0  --   --  0.7  ALBUMIN 3.9  --   --   --  3.7 3.7  --  3.7  CRP  --   --   --   --   --   --   --  3.8*  PROCALCITON  --   --   --  7.67  --   --   --  4.53  LATICACIDVEN  --  3.6* 3.2*  --  1.9  --   --   --   INR  --   --   --   --  1.1  --   --   --   HGBA1C  --   --   --  7.3*  --   --   --   --   BNP 204.4*  --   --   --   --   --  527.4*  --   MG  --   --   --  2.1 2.2  --   --  2.6*  PHOS  --   --   --   --  4.4 2.7  --  3.9  CALCIUM 9.5  --   --   --  9.0 8.9  --  8.2*      Recent Labs  Lab 08/06/23 1650 08/06/23 1657 08/06/23 1930 08/06/23 2315 08/07/23 0319 08/07/23 1841 08/08/23 0425 08/08/23 0427  CRP  --   --   --   --   --   --   --  3.8*  PROCALCITON  --   --   --  7.67  --   --   --  4.53  LATICACIDVEN  --  3.6* 3.2*  --  1.9  --   --   --   INR  --   --   --   --  1.1  --   --   --   HGBA1C  --   --   --  7.3*  --   --   --   --   BNP 204.4*  --   --   --   --   --  527.4*  --   MG  --   --   --  2.1 2.2  --   --  2.6*  CALCIUM 9.5  --   --   --  9.0 8.9  --  8.2*    --------------------------------------------------------------------------------------------------------------- Lab Results  Component Value Date   CHOL 127 11/04/2022   HDL 32.50 (L) 11/04/2022   LDLCALC 53 12/26/2020   LDLDIRECT 51.0 11/04/2022   TRIG 522 (H) 12/26/2022   CHOLHDL 4 11/04/2022    Lab Results  Component Value Date   HGBA1C 7.3 (H) 08/06/2023   No results for input(s): "TSH", "T4TOTAL", "FREET4", "T3FREE", "THYROIDAB" in the last 72 hours. No results for input(s): "VITAMINB12", "FOLATE", "FERRITIN", "TIBC", "IRON", "RETICCTPCT" in the last 72 hours. ------------------------------------------------------------------------------------------------------------------ Cardiac Enzymes No results for input(s): "CKMB", "TROPONINI", "MYOGLOBIN" in the last 168 hours.  Invalid input(s): "CK"  Micro Results Recent Results (from the past 240 hours)   Resp panel by RT-PCR (RSV, Flu A&B, Covid) Anterior Nasal Swab     Status: None   Collection Time: 08/06/23  4:07 PM   Specimen: Anterior Nasal Swab  Result Value Ref Range Status   SARS Coronavirus 2 by RT PCR NEGATIVE NEGATIVE Final   Influenza A by PCR NEGATIVE NEGATIVE Final   Influenza B by PCR NEGATIVE NEGATIVE Final    Comment: (NOTE) The Xpert Xpress SARS-CoV-2/FLU/RSV plus assay is intended as an aid in the diagnosis of influenza from Nasopharyngeal swab specimens and should not be used as a sole basis for treatment. Nasal washings and aspirates are unacceptable for Xpert Xpress  SARS-CoV-2/FLU/RSV testing.  Fact Sheet for Patients: BloggerCourse.com  Fact Sheet for Healthcare Providers: SeriousBroker.it  This test is not yet approved or cleared by the [place] FDA and has been authorized for detection and/or diagnosis of SARS-CoV-2 by FDA under an Emergency Use Authorization (EUA). This EUA will remain in effect (meaning this test can be used) for the duration of the COVID-19 declaration under Section 564(b)(1) of the Act, 21 U.S.C. section 360bbb-3(b)(1), unless the authorization is terminated or revoked.     Resp Syncytial Virus by PCR NEGATIVE NEGATIVE Final    Comment: (NOTE) Fact Sheet for Patients: BloggerCourse.com  Fact Sheet for Healthcare Providers: SeriousBroker.it  This test is not yet approved or cleared by the [place] FDA and has been authorized for detection and/or diagnosis of SARS-CoV-2 by FDA under an Emergency Use Authorization (EUA). This EUA will remain in effect (meaning this test can be used) for the duration of the COVID-19 declaration under Section 564(b)(1) of the Act, 21 U.S.C. section 360bbb-3(b)(1), unless the authorization is terminated or revoked.  Performed at [facility], [address] [place],  [place] [address]   Blood culture (routine x 2)     Status: None (Preliminary result)   Collection Time: 08/06/23  5:00 PM   Specimen: BLOOD  Result Value Ref Range Status   Specimen Description BLOOD SITE NOT SPECIFIED  Final   Special Requests   Final    BOTTLES DRAWN AEROBIC AND ANAEROBIC Blood Culture adequate volume   Culture   Final    NO GROWTH 2 DAYS Performed at [facility], [address], [place], [place] [address]    Report Status PENDING  Incomplete  Blood culture (routine x 2)     Status: None (Preliminary result)   Collection Time: 08/06/23  5:20 PM   Specimen: BLOOD  Result Value Ref Range Status   Specimen Description BLOOD SITE NOT SPECIFIED  Final   Special Requests   Final    BOTTLES DRAWN AEROBIC AND ANAEROBIC Blood Culture adequate volume   Culture   Final    NO GROWTH 2 DAYS Performed at [facility], [address] [place], [place] [address]    Report Status PENDING  Incomplete  MRSA Next Gen by PCR, Nasal     Status: Abnormal   Collection Time: 08/08/23  5:48 AM   Specimen: Nasal Mucosa; Nasal Swab  Result Value Ref Range Status   MRSA by PCR Next Gen DETECTED (A) NOT DETECTED Final    Comment: RESULT CALLED TO, READ BACK BY AND VERIFIED WITH: Dorothea Glassman 2/25 0834 MSG (NOTE) The GeneXpert MRSA Assay (FDA approved for NASAL specimens only), is one component of a comprehensive MRSA colonization surveillance program. It is not intended to diagnose MRSA infection nor to guide or monitor treatment for MRSA infections. Test performance is not FDA approved in patients less than 82 years old. Performed at [facility], [address] [place], [place] [address]     Radiology Report DG Chest Port 1 View Result Date: 08/08/2023 CLINICAL DATA:  Shortness of breath, pneumonia. EXAM: PORTABLE CHEST 1 VIEW COMPARISON:  Yesterday FINDINGS: Cardiomegaly and vascular congestion. Stable streaky density at the medial right base. No Kerley lines,  visible effusion, or pneumothorax. IMPRESSION: Cardiomegaly and vascular congestion. Unchanged mild opacification of the right lung base. Electronically Signed   By: Tiburcio Pea M.D.   On: 08/08/2023 07:13   DG Chest Port 1 View Result Date:  08/07/2023 CLINICAL DATA:  [id number] with shortness of breath, pneumonia versus edema, and respiratory distress. EXAM: PORTABLE CHEST 1 VIEW COMPARISON:  Portable chest yesterday at 4:36 p.m. FINDINGS: 6:13 a.m. The heart is moderately enlarged. A leadless pacemaker in right ventricle is again shown. The mediastinum is stable.  There is aortic atherosclerosis. Mild perihilar vascular congestion persists as well as mild basilar interstitial edema with small pleural effusions. Scattered airspace disease in both bases has partially cleared since yesterday, most likely clearing edema. Remaining lungs clear with COPD change. No other changes in the overall aeration. IMPRESSION: 1. Cardiomegaly with mild perihilar vascular congestion and mild basilar interstitial edema with small pleural effusions. 2. Scattered airspace disease in both bases has partially cleared since yesterday, most likely clearing edema. 3. COPD. 4. Aortic atherosclerosis. Electronically Signed   By: Almira Bar M.D.   On: 08/07/2023 06:25   CT ABDOMEN PELVIS W CONTRAST Result Date: 08/06/2023 CLINICAL DATA:  Abdominal pain EXAM: CT ABDOMEN AND PELVIS WITH CONTRAST TECHNIQUE: Multidetector CT imaging of the abdomen and pelvis was performed using the standard protocol following bolus administration of intravenous contrast. RADIATION DOSE REDUCTION: This exam was performed according to the departmental dose-optimization program which includes automated exposure control, adjustment of the mA and/or kV according to patient size and/or use of iterative reconstruction technique. CONTRAST:  75mL OMNIPAQUE IOHEXOL 350 MG/ML SOLN COMPARISON:  04/22/2023 FINDINGS: Lower chest: Bibasilar atelectasis or infiltrates.  See chest CT report. Hepatobiliary: Liver is enlarged and low density compatible with fatty infiltration. Nodular contours compatible with cirrhosis. Findings stable since prior study. No focal hepatic abnormality. Gallbladder unremarkable. Pancreas: No focal abnormality or ductal dilatation. Spleen: No focal abnormality.  Normal size. Adrenals/Urinary Tract: No adrenal abnormality. No focal renal abnormality. No stones or hydronephrosis. Urinary bladder is unremarkable. Stomach/Bowel: Prior colectomy. Right lower quadrant ileostomy. Stomach and small bowel decompressed. No bowel obstruction or inflammatory process. Vascular/Lymphatic: Diffuse aortoiliac atherosclerosis. No evidence of aneurysm or adenopathy. Reproductive: No visible focal abnormality. Other: Small amount of perihepatic ascites.  No free air. Musculoskeletal: No acute bony abnormality. IMPRESSION: Changes of fatty liver, hepatomegaly and cirrhosis, stable. Small amount of perihepatic ascites. 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 No bowel obstruction. Aortoiliac atherosclerosis. No acute findings. Electronically Signed   By: Charlett Nose M.D.   On: 08/06/2023 20:23   CT Angio Chest PE W and/or Wo Contrast Result Date: 08/06/2023 CLINICAL DATA:  Pulmonary embolism (PE) suspected, high prob. Sob, hypoxia, hx of PE not anticoagulated, abd pain, abd wound; EXAM: CT ANGIOGRAPHY CHEST WITH CONTRAST TECHNIQUE: Multidetector CT imaging of the chest was performed using the standard protocol during bolus administration of intravenous contrast. Multiplanar CT image reconstructions and MIPs were obtained to evaluate the vascular anatomy. RADIATION DOSE REDUCTION: This exam was performed according to the departmental dose-optimization program which includes automated exposure control, adjustment of the mA and/or kV according to patient size and/or use of iterative reconstruction technique. CONTRAST:  75mL OMNIPAQUE  IOHEXOL 350 MG/ML SOLN COMPARISON:  12/23/2022 FINDINGS: Cardiovascular: Cardiomegaly. Coronary artery and aortic atherosclerosis. No filling defects in the pulmonary arteries to suggest pulmonary emboli. Mediastinum/Nodes: No mediastinal, hilar, or axillary adenopathy. Trachea and esophagus are unremarkable. Thyroid unremarkable. Lungs/Pleura: Peribronchial thickening diffusely, most pronounced in the lower lobes and similar to prior study. Bibasilar airspace opacities could reflect atelectasis or pneumonia. No effusions. Upper Abdomen: No acute findings Musculoskeletal: Chest wall soft tissues are unremarkable. No acute bony abnormality. Review of the MIP images confirms the above  findings. IMPRESSION: No evidence of pulmonary embolus. Cardiomegaly, coronary artery disease. Bronchial wall thickening compatible with bronchitis. This is similar to prior study. Continued bibasilar airspace opacities, atelectasis versus pneumonia. Opacities less pronounced than prior study. Aortic Atherosclerosis (ICD10-I70.0). Electronically Signed   By: Charlett Nose M.D.   On: 08/06/2023 20:20   DG Chest Portable 1 View Result Date: 08/06/2023 CLINICAL DATA:  Shortness of breath and hypoxia EXAM: PORTABLE CHEST 1 VIEW COMPARISON:  Chest x-ray 02/17/2023. FINDINGS: There some patchy and interstitial opacities in both lung bases. The cardiomediastinal silhouette is stable, the heart is mildly enlarged. There is no pneumothorax or acute fracture. IMPRESSION: Patchy and interstitial opacities in both lung bases, concerning for pneumonia versus edema. Electronically Signed   By: Darliss Cheney M.D.   On: 08/06/2023 17:06     Signature  -   Susa Raring M.D on 08/08/2023 at 9:05 AM   -  To page go to [email or web address]

## 2023-08-09 ENCOUNTER — Inpatient Hospital Stay (HOSPITAL_COMMUNITY): Payer: Medicaid Other

## 2023-08-09 DIAGNOSIS — J189 Pneumonia, unspecified organism: Secondary | ICD-10-CM | POA: Diagnosis not present

## 2023-08-09 LAB — CBC WITH DIFFERENTIAL/PLATELET
Abs Immature Granulocytes: 0.12 10*3/uL — ABNORMAL HIGH (ref 0.00–0.07)
Basophils Absolute: 0 10*3/uL (ref 0.0–0.1)
Basophils Relative: 0 %
Eosinophils Absolute: 0 10*3/uL (ref 0.0–0.5)
Eosinophils Relative: 0 %
HCT: 38.4 % — ABNORMAL LOW (ref 39.0–52.0)
Hemoglobin: 12.5 g/dL — ABNORMAL LOW (ref 13.0–17.0)
Immature Granulocytes: 1 %
Lymphocytes Relative: 4 %
Lymphs Abs: 0.6 10*3/uL — ABNORMAL LOW (ref 0.7–4.0)
MCH: 33.8 pg (ref 26.0–34.0)
MCHC: 32.6 g/dL (ref 30.0–36.0)
MCV: 103.8 fL — ABNORMAL HIGH (ref 80.0–100.0)
Monocytes Absolute: 1.1 10*3/uL — ABNORMAL HIGH (ref 0.1–1.0)
Monocytes Relative: 8 %
Neutro Abs: 11.9 10*3/uL — ABNORMAL HIGH (ref 1.7–7.7)
Neutrophils Relative %: 87 %
Platelets: 201 10*3/uL (ref 150–400)
RBC: 3.7 MIL/uL — ABNORMAL LOW (ref 4.22–5.81)
RDW: 14.9 % (ref 11.5–15.5)
WBC: 13.8 10*3/uL — ABNORMAL HIGH (ref 4.0–10.5)
nRBC: 0 % (ref 0.0–0.2)

## 2023-08-09 LAB — C-REACTIVE PROTEIN: CRP: 1.4 mg/dL — ABNORMAL HIGH (ref <1.0)

## 2023-08-09 LAB — BASIC METABOLIC PANEL
Anion gap: 12 (ref 5–15)
BUN: 38 mg/dL — ABNORMAL HIGH (ref 8–23)
CO2: 22 mmol/L (ref 22–32)
Calcium: 9 mg/dL (ref 8.9–10.3)
Chloride: 102 mmol/L (ref 98–111)
Creatinine, Ser: 1.15 mg/dL (ref 0.61–1.24)
GFR, Estimated: >60 mL/min (ref >60)
Glucose, Bld: 226 mg/dL — ABNORMAL HIGH (ref 70–99)
Potassium: 4.8 mmol/L (ref 3.5–5.1)
Sodium: 136 mmol/L (ref 135–145)

## 2023-08-09 LAB — BRAIN NATRIURETIC PEPTIDE: B Natriuretic Peptide: 611.2 pg/mL — ABNORMAL HIGH (ref 0.0–100.0)

## 2023-08-09 LAB — PROCALCITONIN: Procalcitonin: 1.82 ng/mL

## 2023-08-09 LAB — GLUCOSE, CAPILLARY
Glucose-Capillary: 196 mg/dL — ABNORMAL HIGH (ref 70–99)
Glucose-Capillary: 184 mg/dL — ABNORMAL HIGH (ref 70–99)
Glucose-Capillary: 219 mg/dL — ABNORMAL HIGH (ref 70–99)
Glucose-Capillary: 259 mg/dL — ABNORMAL HIGH (ref 70–99)

## 2023-08-09 LAB — MAGNESIUM: Magnesium: 2.7 mg/dL — ABNORMAL HIGH (ref 1.7–2.4)

## 2023-08-09 MED ORDER — INSULIN ASPART 100 UNIT/ML IJ SOLN
0.0000 [IU] | Freq: Three times a day (TID) | INTRAMUSCULAR | Status: DC
Start: 2023-08-09 — End: 2023-08-14
  Administered 2023-08-09 (×2): 4 [IU] via SUBCUTANEOUS
  Administered 2023-08-09: 7 [IU] via SUBCUTANEOUS
  Administered 2023-08-10: 3 [IU] via SUBCUTANEOUS
  Administered 2023-08-10: 4 [IU] via SUBCUTANEOUS
  Administered 2023-08-10: 7 [IU] via SUBCUTANEOUS
  Administered 2023-08-11 (×3): 4 [IU] via SUBCUTANEOUS
  Administered 2023-08-12 (×2): 7 [IU] via SUBCUTANEOUS
  Administered 2023-08-12: 17 [IU] via SUBCUTANEOUS
  Administered 2023-08-13: 3 [IU] via SUBCUTANEOUS
  Administered 2023-08-13: 15 [IU] via SUBCUTANEOUS
  Administered 2023-08-13 – 2023-08-14 (×2): 7 [IU] via SUBCUTANEOUS

## 2023-08-09 MED ORDER — FUROSEMIDE 10 MG/ML IJ SOLN
60.0000 mg | Freq: Once | INTRAMUSCULAR | Status: AC
  Administered 2023-08-09: 60 mg via INTRAVENOUS
  Filled 2023-08-09: qty 6

## 2023-08-09 MED ORDER — DOCUSATE SODIUM 100 MG PO CAPS
200.0000 mg | ORAL_CAPSULE | Freq: Two times a day (BID) | ORAL | Status: DC
  Administered 2023-08-09 – 2023-08-13 (×7): 200 mg via ORAL
  Filled 2023-08-09 (×9): qty 2

## 2023-08-09 MED ORDER — METHYLPREDNISOLONE SODIUM SUCC 40 MG IJ SOLR
40.0000 mg | Freq: Every day | INTRAMUSCULAR | Status: DC
  Administered 2023-08-09 – 2023-08-10 (×2): 40 mg via INTRAVENOUS
  Filled 2023-08-09 (×2): qty 1

## 2023-08-09 MED ORDER — FUROSEMIDE 10 MG/ML IJ SOLN: 40.0000 mg | Freq: Once | INTRAMUSCULAR | Status: DC

## 2023-08-09 MED ORDER — POLYETHYLENE GLYCOL 3350 17 G PO PACK
17.0000 g | PACK | Freq: Two times a day (BID) | ORAL | Status: DC
  Administered 2023-08-09 – 2023-08-13 (×6): 17 g via ORAL
  Filled 2023-08-09 (×8): qty 1

## 2023-08-09 MED ORDER — INSULIN ASPART 100 UNIT/ML IJ SOLN
0.0000 [IU] | Freq: Every day | INTRAMUSCULAR | Status: DC
  Administered 2023-08-09 – 2023-08-10 (×2): 3 [IU] via SUBCUTANEOUS

## 2023-08-09 NOTE — Progress Notes (Signed)
 PROGRESS NOTE                                                                                                                                                                                                             Patient Demographics:    Luis [name], is a 63 y.o. male, DOB - 1960-11-14, OZH:[id number]  Outpatient Primary MD for the patient is [name], NP    LOS - 3  Admit date - 08/06/2023    Chief Complaint  Patient presents with   Shortness of Breath       Brief Narrative (HPI from H&P)   63 year old M with PMH of COPD, diastolic CHF, CHB/PPM, DM-2, ileostomy, chronic abdominal wound and tobacco use disorder presenting with progressive shortness of breath for about a week, productive cough for 2 to 3 days, fever, wheezing and orthopnea, and admitted with working diagnosis of acute hypoxic respiratory failure in the setting of community-acquired pneumonia and COPD exacerbation.     Subjective:   Patient in bed, appears comfortable, denies any headache, no fever, no chest pain or pressure, improving shortness of breath , no abdominal pain. No focal weakness.   Assessment  & Plan :    Acute respiratory failure with hypoxia due to pneumonia community-acquired versus aspiration, COPD exacerbation and forcible acute on chronic diastolic CHF EF 55% on echocardiogram few months ago.  he is an alcoholic and still consumes large amount of alcohol and continuously smokes, high risk for aspiration, did have leukocytosis and elevated procalcitonin, we will place him on Unasyn along with azithromycin, continue steroids and diuretics as needed, encouraged to sit in chair use I-S and flutter valve for pulmonary toiletry.  Advance activity and titrate down oxygen.  Clinically has improved, strictly counseled to quit alcohol and smoking.  Speech therapy to evaluate.    COPD exacerbation: As above, minimal wheezing start  tapering steroids.   Acute on chronic diastolic CHF: TTE in 12/2022 with LVEF of 50 to 55%, G2-DD.  He is on torsemide and Farxiga at home.  Minimal crackles, minimal orthopnea, for now continue Lasix as needed, IV on 08/09/2023.  AKI: Baseline Cr 0.8-0.9 although his creatinine is 1.23 in 02/2023.   Uncontrolled hypertension: On beta-blocker and Imdur, monitor ARB held due to hyperkalemia and renal dysfunction.   Complete heart block s/p PPM: EKG  with V paced rhythm and flutter but rate controlled. Continue home Toprol-XL   Ileostomy/chronic abdominal wound: Goes to outpatient wound care. Consulted WOCN   Elevated troponin: Minimal elevation in non-ACS pattern, no chest pain, no further workup, likely demand mismatch from hypoxia.   Lactic acidosis: Likely due to AKI, respiratory failure and metformin.  Supportive care and monitor    Hyponatremia.  Hold further Lasix and monitor.    Hyperkalemia: Lokelma.    Alcohol abuse and tobacco use disorder: Been counseled to quit both, CIWA protocol and NicoDerm patch.   At risk for sleep apnea -Needs outpatient evaluation but has claustrophobia, current oxygen here.  Not on CPAP.   Obesity  Body mass index is 31.12 kg/m.  With PCP for weight loss.  NIDDM-2 with hyperglycemia: A1c 7.3%.  On metformin and Farxiga at home, CBGs elevated due to high-dose steroids, steroids being tapered down, insulin dose adjusted on 08/08/2023, monitor closely, CBGs can fall precipitously once steroids are withdrawn.   CBG (last 3)  Recent Labs    08/08/23 1554 08/08/23 2059 08/09/23 0927  GLUCAP 260* 339* 196*         Condition - Fair  Family Communication  :  None  Code Status :  Full  Consults  :  None  PUD Prophylaxis :    Procedures  :     CT Abd - Changes of fatty liver, hepatomegaly and cirrhosis, stable. Small amount of perihepatic ascites. 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 No bowel  obstruction. Aortoiliac atherosclerosis. No acute findings.   CT Chest - No evidence of pulmonary embolus. Cardiomegaly, coronary artery disease. Bronchial wall thickening compatible with bronchitis. This is similar to prior study. Continued bibasilar airspace opacities, atelectasis versus pneumonia. Opacities less pronounced than prior study. Aortic Atherosclerosis (ICD10-I70.0).      Disposition Plan  :    Status is: Inpatient  DVT Prophylaxis  :    enoxaparin (LOVENOX) injection 40 mg Start: 08/07/23 1100 SCDs Start: 08/06/23 2142  Lab Results  Component Value Date   PLT 201 08/09/2023    Diet :  Diet Order             Diet regular Room service appropriate? Yes; Fluid consistency: Thin; Fluid restriction: 1200 mL Fluid  Diet effective now                    Inpatient Medications  Scheduled Meds:  amLODipine  10 mg Oral Daily   atorvastatin  10 mg Oral Daily   dapagliflozin propanediol  10 mg Oral QAC breakfast   enoxaparin (LOVENOX) injection  40 mg Subcutaneous Q24H   folic acid  1 mg Oral Daily   furosemide  60 mg Intravenous Once   gabapentin  300 mg Oral BID   guaiFENesin  600 mg Oral BID   insulin aspart  0-20 Units Subcutaneous TID WC   insulin aspart  0-5 Units Subcutaneous QHS   insulin aspart  6 Units Subcutaneous TID WC   insulin glargine  25 Units Subcutaneous Daily   ipratropium-albuterol  3 mL Nebulization Q6H   isosorbide mononitrate  30 mg Oral Daily   loratadine  10 mg Oral Daily   LORazepam  0-4 mg Intravenous Q6H   Followed by   Melene Muller ON 08/10/2023] LORazepam  0-4 mg Intravenous Q12H   methylPREDNISolone (SOLU-MEDROL) injection  40 mg Intravenous Daily   metoprolol succinate  25 mg Oral Daily   multivitamin with minerals  1  tablet Oral Daily   nicotine  21 mg Transdermal Daily   thiamine  100 mg Oral Daily   Continuous Infusions:  ampicillin-sulbactam (UNASYN) IV Stopped (08/09/23 0530)   azithromycin 500 mg (08/08/23 1540)   PRN  Meds:.acetaminophen **OR** acetaminophen, albuterol, antiseptic oral rinse, benzonatate, LORazepam **OR** LORazepam, melatonin, ondansetron (ZOFRAN) IV, phenol, sodium chloride    Objective:   Vitals:   08/08/23 2304 08/09/23 0415 08/09/23 0753 08/09/23 0934  BP: (!) 153/79 (!) 157/81    Pulse: 70 60  63  Resp: 18     Temp: 97.7 F (36.5 C) 97.9 F (36.6 C)    TempSrc: Oral Oral    SpO2: 92% 91% 90%   Weight:  111.3 kg    Height:        Wt Readings from Last 3 Encounters:  08/09/23 111.3 kg  07/17/23 107 kg  06/15/23 106.1 kg     Intake/Output Summary (Last 24 hours) at 08/09/2023 0942 Last data filed at 08/09/2023 2956 Gross per 24 hour  Intake 920 ml  Output 3695 ml  Net -2775 ml     Physical Exam  Awake Alert, No new F.N deficits, Normal affect [place].AT,PERRAL Supple Neck, No JVD,   Symmetrical Chest wall movement, Good air movement bilaterally, minimal wheezing RRR,No Gallops,Rubs or new Murmurs,  +ve B.Sounds, Abd Soft, with chronic midline abdominal incision under bandage with colostomy bag No Cyanosis, Clubbing or edema       Data Review:    Recent Labs  Lab 08/06/23 1650 08/07/23 0319 08/08/23 0427 08/09/23 0446  WBC 10.8* 10.1 13.4* 13.8*  HGB 12.7* 12.6* 12.3* 12.5*  HCT 39.1 37.8* 37.2* 38.4*  PLT 211 190 198 201  MCV 103.7* 102.2* 103.3* 103.8*  MCH 33.7 34.1* 34.2* 33.8  MCHC 32.5 33.3 33.1 32.6  RDW 14.9 14.8 15.1 14.9  LYMPHSABS  --  0.4*  --  0.6*  MONOABS  --  0.2  --  1.1*  EOSABS  --  0.0  --  0.0  BASOSABS  --  0.0  --  0.0    Recent Labs  Lab 08/06/23 1650 08/06/23 1657 08/06/23 1930 08/06/23 2315 08/07/23 0319 08/07/23 1841 08/08/23 0425 08/08/23 0427 08/09/23 0446  NA 133*  --   --   --  129* 131*  --  130*  --   K 5.0  --   --   --  6.0* 6.0*  --  5.2*  --   CL 93*  --   --   --  96* 97*  --  100  --   CO2 24  --   --   --  21* 23  --  23  --   ANIONGAP 16*  --   --   --  12 11  --  7  --   GLUCOSE 129*  --   --    --  255* 253*  --  324*  --   BUN 25*  --   --   --  28* 38*  --  40*  --   CREATININE 1.32*  --   --   --  1.39* 1.54*  --  1.31*  --   AST 32  --   --   --  31  --   --  32  --   ALT 29  --   --   --  31  --   --  30  --   ALKPHOS 118  --   --   --  110  --   --  111  --   BILITOT 0.9  --   --   --  1.0  --   --  0.7  --   ALBUMIN 3.9  --   --   --  3.7 3.7  --  3.7  --   CRP  --   --   --   --   --   --   --  3.8* 1.4*  PROCALCITON  --   --   --  7.67  --   --   --  4.53 1.82  LATICACIDVEN  --  3.6* 3.2*  --  1.9  --   --   --   --   INR  --   --   --   --  1.1  --   --   --   --   HGBA1C  --   --   --  7.3*  --   --   --   --   --   BNP 204.4*  --   --   --   --   --  527.4*  --  611.2*  MG  --   --   --  2.1 2.2  --   --  2.6* 2.7*  PHOS  --   --   --   --  4.4 2.7  --  3.9  --   CALCIUM 9.5  --   --   --  9.0 8.9  --  8.2*  --       Recent Labs  Lab 08/06/23 1650 08/06/23 1657 08/06/23 1930 08/06/23 2315 08/07/23 0319 08/07/23 1841 08/08/23 0425 08/08/23 0427 08/09/23 0446  CRP  --   --   --   --   --   --   --  3.8* 1.4*  PROCALCITON  --   --   --  7.67  --   --   --  4.53 1.82  LATICACIDVEN  --  3.6* 3.2*  --  1.9  --   --   --   --   INR  --   --   --   --  1.1  --   --   --   --   HGBA1C  --   --   --  7.3*  --   --   --   --   --   BNP 204.4*  --   --   --   --   --  527.4*  --  611.2*  MG  --   --   --  2.1 2.2  --   --  2.6* 2.7*  CALCIUM 9.5  --   --   --  9.0 8.9  --  8.2*  --     --------------------------------------------------------------------------------------------------------------- Lab Results  Component Value Date   CHOL 127 11/04/2022   HDL 32.50 (L) 11/04/2022   LDLCALC 53 12/26/2020   LDLDIRECT 51.0 11/04/2022   TRIG 522 (H) 12/26/2022   CHOLHDL 4 11/04/2022    Lab Results  Component Value Date   HGBA1C 7.3 (H) 08/06/2023   No results for input(s): "TSH", "T4TOTAL", "FREET4", "T3FREE", "THYROIDAB" in the last 72 hours. No results  for input(s): "VITAMINB12", "FOLATE", "FERRITIN", "TIBC", "IRON", "RETICCTPCT" in the last 72 hours. ------------------------------------------------------------------------------------------------------------------ Cardiac Enzymes No results for input(s): "CKMB", "TROPONINI", "MYOGLOBIN" in the last 168 hours.  Invalid input(s): "CK"  Micro Results Recent Results (from the  past 240 hours)  Resp panel by RT-PCR (RSV, Flu A&B, Covid) Anterior Nasal Swab     Status: None   Collection Time: 08/06/23  4:07 PM   Specimen: Anterior Nasal Swab  Result Value Ref Range Status   SARS Coronavirus 2 by RT PCR NEGATIVE NEGATIVE Final   Influenza A by PCR NEGATIVE NEGATIVE Final   Influenza B by PCR NEGATIVE NEGATIVE Final    Comment: (NOTE) The Xpert Xpress SARS-CoV-2/FLU/RSV plus assay is intended as an aid in the diagnosis of influenza from Nasopharyngeal swab specimens and should not be used as a sole basis for treatment. Nasal washings and aspirates are unacceptable for Xpert Xpress SARS-CoV-2/FLU/RSV testing.  Fact Sheet for Patients: BloggerCourse.com  Fact Sheet for Healthcare Providers: SeriousBroker.it  This test is not yet approved or cleared by the [place] FDA and has been authorized for detection and/or diagnosis of SARS-CoV-2 by FDA under an Emergency Use Authorization (EUA). This EUA will remain in effect (meaning this test can be used) for the duration of the COVID-19 declaration under Section 564(b)(1) of the Act, 21 U.S.C. section 360bbb-3(b)(1), unless the authorization is terminated or revoked.     Resp Syncytial Virus by PCR NEGATIVE NEGATIVE Final    Comment: (NOTE) Fact Sheet for Patients: BloggerCourse.com  Fact Sheet for Healthcare Providers: SeriousBroker.it  This test is not yet approved or cleared by the [place] FDA and has been authorized for  detection and/or diagnosis of SARS-CoV-2 by FDA under an Emergency Use Authorization (EUA). This EUA will remain in effect (meaning this test can be used) for the duration of the COVID-19 declaration under Section 564(b)(1) of the Act, 21 U.S.C. section 360bbb-3(b)(1), unless the authorization is terminated or revoked.  Performed at [facility], [address], [place], [place] [address]   Blood culture (routine x 2)     Status: None (Preliminary result)   Collection Time: 08/06/23  5:00 PM   Specimen: BLOOD  Result Value Ref Range Status   Specimen Description BLOOD SITE NOT SPECIFIED  Final   Special Requests   Final    BOTTLES DRAWN AEROBIC AND ANAEROBIC Blood Culture adequate volume   Culture   Final    NO GROWTH 3 DAYS Performed at [facility], [address], [place], [place] [address]    Report Status PENDING  Incomplete  Blood culture (routine x 2)     Status: None (Preliminary result)   Collection Time: 08/06/23  5:20 PM   Specimen: BLOOD  Result Value Ref Range Status   Specimen Description BLOOD SITE NOT SPECIFIED  Final   Special Requests   Final    BOTTLES DRAWN AEROBIC AND ANAEROBIC Blood Culture adequate volume   Culture   Final    NO GROWTH 3 DAYS Performed at [facility], [address], [place], [place] [address]    Report Status PENDING  Incomplete  MRSA Next Gen by PCR, Nasal     Status: Abnormal   Collection Time: 08/08/23  5:48 AM   Specimen: Nasal Mucosa; Nasal Swab  Result Value Ref Range Status   MRSA by PCR Next Gen DETECTED (A) NOT DETECTED Final    Comment: RESULT CALLED TO, READ BACK BY AND VERIFIED WITH: Dorothea Glassman 2/25 0834 MSG (NOTE) The GeneXpert MRSA Assay (FDA approved for NASAL specimens only), is one component of a comprehensive MRSA colonization surveillance program. It is not intended to diagnose MRSA infection nor to guide or monitor treatment  for MRSA infections. Test performance is not FDA approved in  patients less than 35 years old. Performed at [facility], [name] [place], [place] [address]     Radiology Report DG Chest Port 1 View Result Date: 08/08/2023 CLINICAL DATA:  Shortness of breath, pneumonia. EXAM: PORTABLE CHEST 1 VIEW COMPARISON:  Yesterday FINDINGS: Cardiomegaly and vascular congestion. Stable streaky density at the medial right base. No Kerley lines, visible effusion, or pneumothorax. IMPRESSION: Cardiomegaly and vascular congestion. Unchanged mild opacification of the right lung base. Electronically Signed   By: Tiburcio Pea M.D.   On: 08/08/2023 07:13     Signature  -   Susa Raring M.D on 08/09/2023 at 9:42 AM   -  To page go to [email or web address]

## 2023-08-09 NOTE — TOC Progression Note (Signed)
 Transition of Care [facility]) - Progression Note    Patient Details  Name: Luis [name] MRN: [id number] Date of Birth: 1960-07-18  Transition of [facility]) CM/SW Contact  Chevie Birkhead Reeves Forth, Student-Social Work Phone Number: 08/09/2023, 2:53 PM  Clinical Narrative:     MSW Intern spoke with pt regarding SA consult. Pt turned down SA resources.       Expected Discharge Plan and Services                                               Social Determinants of Health (SDOH) Interventions SDOH Screenings   Food Insecurity: No Food Insecurity (08/07/2023)  Housing: Low Risk  (08/07/2023)  Transportation Needs: Unmet Transportation Needs (08/07/2023)  Utilities: Not At Risk (08/07/2023)  Depression (PHQ2-9): Low Risk  (06/15/2023)  Recent Concern: Depression (PHQ2-9) - High Risk (04/18/2023)  Stress: Stress Concern Present (04/18/2023)  Tobacco Use: High Risk (08/06/2023)    Readmission Risk Interventions     No data to display

## 2023-08-09 NOTE — Progress Notes (Addendum)
   08/09/23 1101  Mobility  Activity Ambulated independently in hallway  Level of Assistance Standby assist, set-up cues, supervision of patient - no hands on  Assistive Device None  Distance Ambulated (ft) 200 ft  Range of Motion/Exercises All extremities  Activity Response Tolerated fair  Mobility Referral Yes  Mobility visit 1 Mobility  Mobility Specialist Start Time (ACUTE ONLY) 1051  Mobility Specialist Stop Time (ACUTE ONLY) 1101  Mobility Specialist Time Calculation (min) (ACUTE ONLY) 10 min   Mobility Specialist: Progress Note  Pre-Mobility:      HR 93, SpO2 85% 3L During Mobility:             SpO2 91% 6L  Post-Mobility:                SpO2 92% 6L   Pt agreeable to mobility session - received in bed. C/o SOB with exertion, pt frustrated with his health hx, stated "I'm ready for them to put my stomach back together so I can go home." Pt stated he becomes SOB in the grocery store and requires assistance to put groceries in the car, pt states he has no family that lives in the area nor a car to get around.  Returned to bed with all needs met - call bell within reach.   Barnie Mort, BS Mobility Specialist Please contact via SecureChat or  Rehab office at [phone].

## 2023-08-09 NOTE — Progress Notes (Signed)
 Modified Barium Swallow Study  Patient Details  Name: Luis [name] MRN: [id number] Date of Birth: 1960/09/11  Today's Date: 08/09/2023  Modified Barium Swallow completed.  Full report located under Chart Review in the Imaging Section.  History of Present Illness Luis [name] is a 63 yo male presenting to ED 2/23 with one week history of progressive shortness of breath, productive cough, wheezing, and orthopnea. Admitted with acute hypoxic respiratory failure in the setting of CAP and COPD exacerbation. Seen by SLP 12/25/22 with recommendations to continue regular diet. PMH includes COPD, diastolic CHF, CHB/PPM, T2DM, ileostomy, chronic abdominal wound, tobacco/alcohol use  .   Clinical Impression Pt exhibits an overall functional oropharyngeal swallow. He presents with increased WOB this date but protects his airway adequately with POs. He consistently has flash penetration with thin liquids that is independently cleared by subsequent swallows (PAS 2). This is considered WFL. No further penetration or aspiration occurred with any consistencies trialed. There is trace BOT residue that is ultimately cleared by subswallows. Provided education to pt regarding using increased caution in times of increased supplemental O2 requirement. Recommend he continue regular solids with thin liquids. No further SLP f/u is needed, will sign off. Factors that may increase risk of adverse event in presence of aspiration Rubye [employer]aks [employer] Clearance Coots 2021): Poor general health and/or compromised immunity;Limited mobility  Swallow Evaluation Recommendations Recommendations: PO diet PO Diet Recommendation: Regular;Thin liquids (Level 0) Liquid Administration via: Cup;Straw Medication Administration: Whole meds with liquid Supervision: Patient able to self-feed Swallowing strategies  : Minimize environmental distractions;Slow rate Postural changes: Position pt fully upright for meals Oral care recommendations: Oral care BID  (2x/day)      Gwynneth Aliment, M.A., CF-SLP Speech Language Pathology, Acute Rehabilitation Services  Secure Chat preferred [phone]  08/09/2023,1:59 PM

## 2023-08-09 NOTE — Plan of Care (Cosign Needed)
 I assumed care of this patient around 0800 as a Runner, broadcasting/film/video alongside [employer]. I assisted this patient with ambulating and emptying his ostomy bag . He became short of breath with exertion , so I performed a detailed focused assessment on his respiratory and cardiovascular system. Ostomy characteristics within normal limits and stoma is a reddish-pink color. Priority assessment findings include diminished breath sounds along with lower extremity non-pitting edema. I gave 0800, 1000, 1400, and 1500 medications. These medications consisted of Amlodipine 10 mg oral, Atorvastatin 10 mg oral, Dapagliflozin Propanediol 10 mg oral , Docusate Sodium 200 mg oral , enoxaparin 40 mg subcutaneous, Folic acid 1 mg oral, Gabapentin 300 mg oral , Guaifenesin 600 mg oral,  insulin glargine subcutaneous 25 units, isosorbide mononitrate 30 mg oral, loratadine 10 mg oral,  methylprednisolone sodium succinate 40 mg IV, Metoprolol succinate 25 mg oral, multivitamin 1 tablet oral , nicotine patch 21 mg , polyethylene glycol 17 g oral , Furosemide 40 mg IV , Thiamine 100 mg oral , and  Insulin Aspart subcutaneous at 0928, 1254, and 1626. With this patient being on a diuretic I continuously monitored the patient's intake and output in the urinal . I recorded an output of 416 mL at 1130 and 400 mL at 1418. Normal urine characteristics documented. Patient is currently stable on 3L of Oxygen via nasal cannula. Recommendations for this patient includes increased ability to maintain a balanced intake and output,  maintain a glucose level within normal limits, and education on decreasing alcohol intake .

## 2023-08-10 ENCOUNTER — Inpatient Hospital Stay (HOSPITAL_COMMUNITY): Payer: Medicaid Other

## 2023-08-10 DIAGNOSIS — R0602 Shortness of breath: Secondary | ICD-10-CM | POA: Diagnosis not present

## 2023-08-10 DIAGNOSIS — J189 Pneumonia, unspecified organism: Secondary | ICD-10-CM | POA: Diagnosis not present

## 2023-08-10 DIAGNOSIS — I700 Atherosclerosis of aorta: Secondary | ICD-10-CM | POA: Diagnosis not present

## 2023-08-10 DIAGNOSIS — R0989 Other specified symptoms and signs involving the circulatory and respiratory systems: Secondary | ICD-10-CM | POA: Diagnosis not present

## 2023-08-10 DIAGNOSIS — R918 Other nonspecific abnormal finding of lung field: Secondary | ICD-10-CM | POA: Diagnosis not present

## 2023-08-10 LAB — COMPREHENSIVE METABOLIC PANEL
ALT: 96 U/L — ABNORMAL HIGH (ref 0–44)
AST: 77 U/L — ABNORMAL HIGH (ref 15–41)
Albumin: 4 g/dL (ref 3.5–5.0)
Alkaline Phosphatase: 116 U/L (ref 38–126)
Anion gap: 10 (ref 5–15)
BUN: 30 mg/dL — ABNORMAL HIGH (ref 8–23)
CO2: 28 mmol/L (ref 22–32)
Calcium: 9.1 mg/dL (ref 8.9–10.3)
Chloride: 102 mmol/L (ref 98–111)
Creatinine, Ser: 0.99 mg/dL (ref 0.61–1.24)
GFR, Estimated: >60 mL/min (ref >60)
Glucose, Bld: 138 mg/dL — ABNORMAL HIGH (ref 70–99)
Potassium: 4.3 mmol/L (ref 3.5–5.1)
Sodium: 140 mmol/L (ref 135–145)
Total Bilirubin: 1.1 mg/dL (ref 0.0–1.2)
Total Protein: 7.9 g/dL (ref 6.5–8.1)

## 2023-08-10 LAB — CBC WITH DIFFERENTIAL/PLATELET
Abs Immature Granulocytes: 0.16 10*3/uL — ABNORMAL HIGH (ref 0.00–0.07)
Basophils Absolute: 0 10*3/uL (ref 0.0–0.1)
Basophils Relative: 0 %
Eosinophils Absolute: 0 10*3/uL (ref 0.0–0.5)
Eosinophils Relative: 0 %
HCT: 38.6 % — ABNORMAL LOW (ref 39.0–52.0)
Hemoglobin: 12.8 g/dL — ABNORMAL LOW (ref 13.0–17.0)
Immature Granulocytes: 1 %
Lymphocytes Relative: 10 %
Lymphs Abs: 1.1 10*3/uL (ref 0.7–4.0)
MCH: 34.3 pg — ABNORMAL HIGH (ref 26.0–34.0)
MCHC: 33.2 g/dL (ref 30.0–36.0)
MCV: 103.5 fL — ABNORMAL HIGH (ref 80.0–100.0)
Monocytes Absolute: 1.2 10*3/uL — ABNORMAL HIGH (ref 0.1–1.0)
Monocytes Relative: 11 %
Neutro Abs: 8.9 10*3/uL — ABNORMAL HIGH (ref 1.7–7.7)
Neutrophils Relative %: 78 %
Platelets: 216 10*3/uL (ref 150–400)
RBC: 3.73 MIL/uL — ABNORMAL LOW (ref 4.22–5.81)
RDW: 14.9 % (ref 11.5–15.5)
WBC: 11.5 10*3/uL — ABNORMAL HIGH (ref 4.0–10.5)
nRBC: 0 % (ref 0.0–0.2)

## 2023-08-10 LAB — GLUCOSE, CAPILLARY
Glucose-Capillary: 123 mg/dL — ABNORMAL HIGH (ref 70–99)
Glucose-Capillary: 150 mg/dL — ABNORMAL HIGH (ref 70–99)
Glucose-Capillary: 179 mg/dL — ABNORMAL HIGH (ref 70–99)
Glucose-Capillary: 244 mg/dL — ABNORMAL HIGH (ref 70–99)
Glucose-Capillary: 300 mg/dL — ABNORMAL HIGH (ref 70–99)

## 2023-08-10 LAB — PROCALCITONIN: Procalcitonin: 0.77 ng/mL

## 2023-08-10 LAB — C-REACTIVE PROTEIN: CRP: 0.6 mg/dL (ref <1.0)

## 2023-08-10 LAB — MAGNESIUM: Magnesium: 2.5 mg/dL — ABNORMAL HIGH (ref 1.7–2.4)

## 2023-08-10 LAB — BRAIN NATRIURETIC PEPTIDE: B Natriuretic Peptide: 513.2 pg/mL — ABNORMAL HIGH (ref 0.0–100.0)

## 2023-08-10 MED ORDER — CHLORDIAZEPOXIDE HCL 5 MG PO CAPS
10.0000 mg | ORAL_CAPSULE | Freq: Three times a day (TID) | ORAL | Status: DC
  Administered 2023-08-10 – 2023-08-14 (×13): 10 mg via ORAL
  Filled 2023-08-10 (×13): qty 2

## 2023-08-10 MED ORDER — METOLAZONE 2.5 MG PO TABS
2.5000 mg | ORAL_TABLET | Freq: Once | ORAL | Status: AC
  Administered 2023-08-10: 2.5 mg via ORAL
  Filled 2023-08-10: qty 1

## 2023-08-10 MED ORDER — FUROSEMIDE 10 MG/ML IJ SOLN
80.0000 mg | Freq: Once | INTRAMUSCULAR | Status: AC
  Administered 2023-08-10: 80 mg via INTRAVENOUS
  Filled 2023-08-10: qty 8

## 2023-08-10 MED ORDER — MUPIROCIN 2 % EX OINT
TOPICAL_OINTMENT | Freq: Two times a day (BID) | CUTANEOUS | Status: DC
  Filled 2023-08-10: qty 22

## 2023-08-10 NOTE — TOC Progression Note (Signed)
 Transition of Care [facility]) - Progression Note    Patient Details  Name: Luis [name] MRN: [id number] Date of Birth: 1960/08/10  Transition of Care [facility]) CM/SW Contact  Pinkney Venard Reeves Forth, Student-Social Work Phone Number: 08/10/2023, 12:54 PM  Clinical Narrative:     MSW Intern completed SA consult with pt yesterday, during which pt expressed having questions about disability and social security but wanted to wait until today so he could take a nap. MSW Intern provided pt with [employer] office phone number so he could get in touch with them to ask why he had not received his check yet.       Expected Discharge Plan and Services                                               Social Determinants of Health (SDOH) Interventions SDOH Screenings   Food Insecurity: No Food Insecurity (08/07/2023)  Housing: Low Risk  (08/07/2023)  Transportation Needs: Unmet Transportation Needs (08/07/2023)  Utilities: Not At Risk (08/07/2023)  Depression (PHQ2-9): Low Risk  (06/15/2023)  Recent Concern: Depression (PHQ2-9) - High Risk (04/18/2023)  Stress: Stress Concern Present (04/18/2023)  Tobacco Use: High Risk (08/06/2023)    Readmission Risk Interventions     No data to display

## 2023-08-10 NOTE — Progress Notes (Signed)
 Physical Therapy Treatment Patient Details Name: Luis [name] MRN: [id number] DOB: 1961-05-12 Today's Date: 08/10/2023   History of Present Illness 63 year old M presenting with progressive shortness of breath for about a week, productive cough for 2 to 3 days, fever, wheezing and orthopnea, and admitted with working diagnosis of acute hypoxic respiratory failure in the setting of community-acquired pneumonia and COPD exacerbation. PMH of COPD, diastolic CHF, CHB/PPM, DM-2, ileostomy, chronic abdominal wound and tobacco use    PT Comments  Pt tolerated treatment well today. Pt was able to progress ambulation in hallway with no AD at supervision level. Pt did require 1 standing rest break however no LOB noted. SpO2: 88-90% on 6L during ambulation. Pt returned to room and placed back on 8L at 93%. No change in DC/DME recs at this time. PT will continue to follow.     If plan is discharge home, recommend the following: A little help with walking and/or transfers;A little help with bathing/dressing/bathroom;Assistance with cooking/housework;Assist for transportation;Help with stairs or ramp for entrance   Can travel by private vehicle        Equipment Recommendations  None recommended by PT    Recommendations for Other Services       Precautions / Restrictions Precautions Precautions: Fall Recall of Precautions/Restrictions: Intact Precaution/Restrictions Comments: watch O2 (does not wear at baseline), ostomy Restrictions Weight Bearing Restrictions Per Provider Order: No     Mobility  Bed Mobility               General bed mobility comments: Seated EOB    Transfers Overall transfer level: Independent Equipment used: None Transfers: Sit to/from Stand Sit to Stand: Independent                Ambulation/Gait Ambulation/Gait assistance: Supervision Gait Distance (Feet): 350 Feet Assistive device: None Gait Pattern/deviations: Decreased stride length, Step-through  pattern Gait velocity: decreased     General Gait Details: 1 standing rest break against wall. Pt overall steady with no LOB noted.   Stairs             Wheelchair Mobility     Tilt Bed    Modified Rankin (Stroke Patients Only)       Balance Overall balance assessment: Needs assistance Sitting-balance support: No upper extremity supported, Feet supported Sitting balance-Leahy Scale: Good     Standing balance support: Bilateral upper extremity supported, During functional activity Standing balance-Leahy Scale: Fair                              Hotel manager: No apparent difficulties  Cognition Arousal: Alert Behavior During Therapy: WFL for tasks assessed/performed, Restless                           PT - Cognition Comments: Can be impulsive and tangential at times. Following commands: Intact      Cueing Cueing Techniques: Verbal cues  Exercises      General Comments General comments (skin integrity, edema, etc.): SpO2: 88-90% on 6L during ambulation. Pt returned to room and placed back on 8L at 93%.      Pertinent Vitals/Pain Pain Assessment Pain Assessment: No/denies pain    Home Living                          Prior Function  PT Goals (current goals can now be found in the care plan section) Progress towards PT goals: Progressing toward goals    Frequency    Min 1X/week      PT Plan      Co-evaluation              AM-PAC PT "6 Clicks" Mobility   Outcome Measure  Help needed turning from your back to your side while in a flat bed without using bedrails?: None Help needed moving from lying on your back to sitting on the side of a flat bed without using bedrails?: None Help needed moving to and from a bed to a chair (including a wheelchair)?: None Help needed standing up from a chair using your arms (e.g., wheelchair or bedside chair)?: A Little Help  needed to walk in hospital room?: A Little Help needed climbing 3-5 steps with a railing? : A Little 6 Click Score: 21    End of Session Equipment Utilized During Treatment: Gait belt;Oxygen Activity Tolerance: Patient tolerated treatment well Patient left: in chair;with call bell/phone within reach Nurse Communication: Mobility status PT Visit Diagnosis: Other abnormalities of gait and mobility (R26.89)     Time: 4098-1191 PT Time Calculation (min) (ACUTE ONLY): 16 min  Charges:    $Gait Training: 8-22 mins PT General Charges $$ ACUTE PT VISIT: 1 Visit                     Shela Nevin, PT, DPT Acute Rehab Services [phone]    [name] 08/10/2023, 3:03 PM

## 2023-08-10 NOTE — Plan of Care (Cosign Needed)
 I assumed care for [name] around 0800 with Joline Maxcy, RN. Patient is AAO x4. I assisted patient to sit on the side of the bed. Patient is moving extremities freely and hanging feet off the bed. Patient does not use assistive device to walk. Patient has clear, diminished lung sounds bilaterally. Patient has slight edema in both lower extremities. Patient abdomen is soft and non-distended, patient does have ileostomy. Requested help with ostomy care but patient refused. Ostomy characteristics is within defined limits and stoma is reddish-pink. Patient experienced IV infiltration and RN was notified. Patient was given tylenol for pain and a cold pack for his arm.

## 2023-08-10 NOTE — Progress Notes (Signed)
 Remote pacemaker transmission.

## 2023-08-10 NOTE — Addendum Note (Signed)
 Addended by: Geralyn Flash D on: 08/10/2023 02:35 PM   Modules accepted: Orders

## 2023-08-10 NOTE — Progress Notes (Signed)
 PROGRESS NOTE                                                                                                                                                                                                             Patient Demographics:    Luis [name], is a 63 y.o. male, DOB - June 30, 1960, WUJ:[id number]  Outpatient Primary MD for the patient is [name], NP    LOS - 4  Admit date - 08/06/2023    Chief Complaint  Patient presents with   Shortness of Breath       Brief Narrative (HPI from H&P)   63 year old M with PMH of COPD, diastolic CHF, CHB/PPM, DM-2, ileostomy, chronic abdominal wound and tobacco use disorder presenting with progressive shortness of breath for about a week, productive cough for 2 to 3 days, fever, wheezing and orthopnea, and admitted with working diagnosis of acute hypoxic respiratory failure in the setting of community-acquired pneumonia and COPD exacerbation.     Subjective:   Patient in bed, appears comfortable, denies any headache, no fever, no chest pain or pressure, still has some shortness of breath , no abdominal pain. No focal weakness.   Assessment  & Plan :   Acute respiratory failure with hypoxia due to pneumonia community-acquired versus aspiration, COPD exacerbation and forcible acute on chronic diastolic CHF EF 55% on echocardiogram few months ago. he is an alcoholic and still consumes large amount of alcohol and continuously smokes, high risk for aspiration, did have leukocytosis and elevated procalcitonin, we will place him on Unasyn along with azithromycin, continue steroids and diuretics as needed, encouraged to sit in chair use I-S and flutter valve for pulmonary toiletry.  Advance activity and titrate down oxygen.  Clinically has improved, strictly counseled to quit alcohol and smoking.  Speech therapy to evaluate.   COPD exacerbation: As above, minimal wheezing start  tapering steroids.   Acute on chronic diastolic CHF: TTE in 12/2022 with LVEF of 50 to 55%, G2-DD.  He is on torsemide and Farxiga at home.  Minimal crackles, minimal orthopnea, Lasix + Zaroxolyn on 08/10/2023, some evidence of orthopnea and fluid overload.  AKI: Baseline Cr 0.8-0.9 although his creatinine is 1.23 in 02/2023.   Uncontrolled hypertension: On beta-blocker and Imdur, monitor ARB held due to hyperkalemia and renal dysfunction.   Complete heart block s/p  PPM: EKG with V paced rhythm and flutter but rate controlled. Continue home Toprol-XL   Ileostomy/chronic abdominal wound: Goes to outpatient wound care. Consulted WOCN   Elevated troponin: Minimal elevation in non-ACS pattern, no chest pain, no further workup, likely demand mismatch from hypoxia.   Lactic acidosis: Likely due to AKI, respiratory failure and metformin.  Supportive care and monitor    Hyponatremia.  Hold further Lasix and monitor.    Hyperkalemia: Lokelma.    Alcohol abuse and tobacco use disorder: Been counseled to quit both, CIWA protocol and NicoDerm patch.  Seems to be slightly jittery on 08/10/2023 add low-dose Librium.  Seems to be in early DTs.   At risk for sleep apnea -Needs outpatient evaluation but has claustrophobia, current oxygen here.  Not on CPAP.   Obesity  Body mass index is 31.12 kg/m.  With PCP for weight loss.  NIDDM-2 with hyperglycemia: A1c 7.3%.  On metformin and Farxiga at home, CBGs elevated due to high-dose steroids, steroids being tapered down, insulin dose adjusted on 08/08/2023, monitor closely, CBGs can fall precipitously once steroids are withdrawn.   CBG (last 3)  Recent Labs    08/09/23 1559 08/09/23 2132 08/10/23 0800  GLUCAP 219* 259* 123*         Condition - Fair  Family Communication  :  None  Code Status :  Full  Consults  :  None  PUD Prophylaxis :    Procedures  :     CT Abd - Changes of fatty liver, hepatomegaly and cirrhosis, stable. Small amount  of perihepatic ascites. 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 No bowel obstruction. Aortoiliac atherosclerosis. No acute findings.   CT Chest - No evidence of pulmonary embolus. Cardiomegaly, coronary artery disease. Bronchial wall thickening compatible with bronchitis. This is similar to prior study. Continued bibasilar airspace opacities, atelectasis versus pneumonia. Opacities less pronounced than prior study. Aortic Atherosclerosis (ICD10-I70.0).      Disposition Plan  :    Status is: Inpatient  DVT Prophylaxis  :    enoxaparin (LOVENOX) injection 40 mg Start: 08/07/23 1100 SCDs Start: 08/06/23 2142  Lab Results  Component Value Date   PLT 216 08/10/2023    Diet :  Diet Order             Diet regular Room service appropriate? Yes; Fluid consistency: Thin; Fluid restriction: 1200 mL Fluid  Diet effective now                    Inpatient Medications  Scheduled Meds:  amLODipine  10 mg Oral Daily   atorvastatin  10 mg Oral Daily   dapagliflozin propanediol  10 mg Oral QAC breakfast   docusate sodium  200 mg Oral BID   enoxaparin (LOVENOX) injection  40 mg Subcutaneous Q24H   folic acid  1 mg Oral Daily   furosemide  80 mg Intravenous Once   gabapentin  300 mg Oral BID   guaiFENesin  600 mg Oral BID   insulin aspart  0-20 Units Subcutaneous TID WC   insulin aspart  0-5 Units Subcutaneous QHS   insulin aspart  6 Units Subcutaneous TID WC   insulin glargine  25 Units Subcutaneous Daily   ipratropium-albuterol  3 mL Nebulization Q6H   isosorbide mononitrate  30 mg Oral Daily   loratadine  10 mg Oral Daily   methylPREDNISolone (SOLU-MEDROL) injection  40 mg Intravenous Daily   metolazone  2.5 mg Oral Once  metoprolol succinate  25 mg Oral Daily   multivitamin with minerals  1 tablet Oral Daily   nicotine  21 mg Transdermal Daily   polyethylene glycol  17 g Oral BID   thiamine  100 mg Oral Daily   Continuous Infusions:   ampicillin-sulbactam (UNASYN) IV 3 g (08/10/23 0512)   azithromycin 500 mg (08/09/23 1620)   PRN Meds:.acetaminophen **OR** acetaminophen, albuterol, antiseptic oral rinse, benzonatate, LORazepam **OR** LORazepam, melatonin, ondansetron (ZOFRAN) IV, phenol, sodium chloride    Objective:   Vitals:   08/10/23 0700 08/10/23 0750 08/10/23 0759 08/10/23 0803  BP:    (!) 166/97  Pulse: 60  91 91  Resp:      Temp:    97.6 F (36.4 C)  TempSrc:    Oral  SpO2: (!) 86% (!) 87% 92% 92%  Weight:      Height:        Wt Readings from Last 3 Encounters:  08/10/23 107.4 kg  07/17/23 107 kg  06/15/23 106.1 kg     Intake/Output Summary (Last 24 hours) at 08/10/2023 0935 Last data filed at 08/10/2023 0456 Gross per 24 hour  Intake 817 ml  Output 4256 ml  Net -3439 ml     Physical Exam  Awake Alert, No new F.N deficits, Normal affect [place].AT,PERRAL Supple Neck, No JVD,   Symmetrical Chest wall movement, Good air movement bilaterally, minimal wheezing RRR,No Gallops,Rubs or new Murmurs,  +ve B.Sounds, Abd Soft, with chronic midline abdominal incision under bandage with colostomy bag No Cyanosis, Clubbing or edema       Data Review:    Recent Labs  Lab 08/06/23 1650 08/07/23 0319 08/08/23 0427 08/09/23 0446 08/10/23 0514  WBC 10.8* 10.1 13.4* 13.8* 11.5*  HGB 12.7* 12.6* 12.3* 12.5* 12.8*  HCT 39.1 37.8* 37.2* 38.4* 38.6*  PLT 211 190 198 201 216  MCV 103.7* 102.2* 103.3* 103.8* 103.5*  MCH 33.7 34.1* 34.2* 33.8 34.3*  MCHC 32.5 33.3 33.1 32.6 33.2  RDW 14.9 14.8 15.1 14.9 14.9  LYMPHSABS  --  0.4*  --  0.6* 1.1  MONOABS  --  0.2  --  1.1* 1.2*  EOSABS  --  0.0  --  0.0 0.0  BASOSABS  --  0.0  --  0.0 0.0    Recent Labs  Lab 08/06/23 1650 08/06/23 1657 08/06/23 1930 08/06/23 2315 08/07/23 0319 08/07/23 1841 08/08/23 0425 08/08/23 0427 08/09/23 0446 08/10/23 0514  NA 133*  --   --   --  129* 131*  --  130* 136 140  K 5.0  --   --   --  6.0* 6.0*  --  5.2* 4.8  4.3  CL 93*  --   --   --  96* 97*  --  100 102 102  CO2 24  --   --   --  21* 23  --  23 22 28   ANIONGAP 16*  --   --   --  12 11  --  7 12 10   GLUCOSE 129*  --   --   --  255* 253*  --  324* 226* 138*  BUN 25*  --   --   --  28* 38*  --  40* 38* 30*  CREATININE 1.32*  --   --   --  1.39* 1.54*  --  1.31* 1.15 0.99  AST 32  --   --   --  31  --   --  32  --  77*  ALT 29  --   --   --  31  --   --  30  --  96*  ALKPHOS 118  --   --   --  110  --   --  111  --  116  BILITOT 0.9  --   --   --  1.0  --   --  0.7  --  1.1  ALBUMIN 3.9  --   --   --  3.7 3.7  --  3.7  --  4.0  CRP  --   --   --   --   --   --   --  3.8* 1.4* 0.6  PROCALCITON  --   --   --  7.67  --   --   --  4.53 1.82 0.77  LATICACIDVEN  --  3.6* 3.2*  --  1.9  --   --   --   --   --   INR  --   --   --   --  1.1  --   --   --   --   --   HGBA1C  --   --   --  7.3*  --   --   --   --   --   --   BNP 204.4*  --   --   --   --   --  527.4*  --  611.2* 513.2*  MG  --   --   --  2.1 2.2  --   --  2.6* 2.7* 2.5*  PHOS  --   --   --   --  4.4 2.7  --  3.9  --   --   CALCIUM 9.5  --   --   --  9.0 8.9  --  8.2* 9.0 9.1      Recent Labs  Lab 08/06/23 1650 08/06/23 1657 08/06/23 1930 08/06/23 2315 08/07/23 0319 08/07/23 1841 08/08/23 0425 08/08/23 0427 08/09/23 0446 08/10/23 0514  CRP  --   --   --   --   --   --   --  3.8* 1.4* 0.6  PROCALCITON  --   --   --  7.67  --   --   --  4.53 1.82 0.77  LATICACIDVEN  --  3.6* 3.2*  --  1.9  --   --   --   --   --   INR  --   --   --   --  1.1  --   --   --   --   --   HGBA1C  --   --   --  7.3*  --   --   --   --   --   --   BNP 204.4*  --   --   --   --   --  527.4*  --  611.2* 513.2*  MG  --   --   --  2.1 2.2  --   --  2.6* 2.7* 2.5*  CALCIUM 9.5  --   --   --  9.0 8.9  --  8.2* 9.0 9.1    --------------------------------------------------------------------------------------------------------------- Lab Results  Component Value Date   CHOL 127 11/04/2022   HDL 32.50  (L) 11/04/2022   LDLCALC 53 12/26/2020   LDLDIRECT 51.0 11/04/2022   TRIG 522 (H) 12/26/2022   CHOLHDL 4 11/04/2022  Lab Results  Component Value Date   HGBA1C 7.3 (H) 08/06/2023   No results for input(s): "TSH", "T4TOTAL", "FREET4", "T3FREE", "THYROIDAB" in the last 72 hours. No results for input(s): "VITAMINB12", "FOLATE", "FERRITIN", "TIBC", "IRON", "RETICCTPCT" in the last 72 hours. ------------------------------------------------------------------------------------------------------------------ Cardiac Enzymes No results for input(s): "CKMB", "TROPONINI", "MYOGLOBIN" in the last 168 hours.  Invalid input(s): "CK"  Micro Results Recent Results (from the past 240 hours)  Resp panel by RT-PCR (RSV, Flu A&B, Covid) Anterior Nasal Swab     Status: None   Collection Time: 08/06/23  4:07 PM   Specimen: Anterior Nasal Swab  Result Value Ref Range Status   SARS Coronavirus 2 by RT PCR NEGATIVE NEGATIVE Final   Influenza A by PCR NEGATIVE NEGATIVE Final   Influenza B by PCR NEGATIVE NEGATIVE Final    Comment: (NOTE) The Xpert Xpress SARS-CoV-2/FLU/RSV plus assay is intended as an aid in the diagnosis of influenza from Nasopharyngeal swab specimens and should not be used as a sole basis for treatment. Nasal washings and aspirates are unacceptable for Xpert Xpress SARS-CoV-2/FLU/RSV testing.  Fact Sheet for Patients: BloggerCourse.com  Fact Sheet for Healthcare Providers: SeriousBroker.it  This test is not yet approved or cleared by the [place] FDA and has been authorized for detection and/or diagnosis of SARS-CoV-2 by FDA under an Emergency Use Authorization (EUA). This EUA will remain in effect (meaning this test can be used) for the duration of the COVID-19 declaration under Section 564(b)(1) of the Act, 21 U.S.C. section 360bbb-3(b)(1), unless the authorization is terminated or revoked.     Resp Syncytial Virus by  PCR NEGATIVE NEGATIVE Final    Comment: (NOTE) Fact Sheet for Patients: BloggerCourse.com  Fact Sheet for Healthcare Providers: SeriousBroker.it  This test is not yet approved or cleared by the [place] FDA and has been authorized for detection and/or diagnosis of SARS-CoV-2 by FDA under an Emergency Use Authorization (EUA). This EUA will remain in effect (meaning this test can be used) for the duration of the COVID-19 declaration under Section 564(b)(1) of the Act, 21 U.S.C. section 360bbb-3(b)(1), unless the authorization is terminated or revoked.  Performed at [facility], [address], [place], [place] [address]   Blood culture (routine x 2)     Status: None (Preliminary result)   Collection Time: 08/06/23  5:00 PM   Specimen: BLOOD  Result Value Ref Range Status   Specimen Description BLOOD SITE NOT SPECIFIED  Final   Special Requests   Final    BOTTLES DRAWN AEROBIC AND ANAEROBIC Blood Culture adequate volume   Culture   Final    NO GROWTH 4 DAYS Performed at [facility], [address] [place], [place] [address]    Report Status PENDING  Incomplete  Blood culture (routine x 2)     Status: None (Preliminary result)   Collection Time: 08/06/23  5:20 PM   Specimen: BLOOD  Result Value Ref Range Status   Specimen Description BLOOD SITE NOT SPECIFIED  Final   Special Requests   Final    BOTTLES DRAWN AEROBIC AND ANAEROBIC Blood Culture adequate volume   Culture   Final    NO GROWTH 4 DAYS Performed at [facility], [address], [place], [place] [address]    Report Status PENDING  Incomplete  MRSA Next Gen by PCR, Nasal     Status: Abnormal   Collection Time: 08/08/23  5:48 AM   Specimen: Nasal Mucosa; Nasal Swab  Result  Value Ref Range Status   MRSA by PCR Next Gen DETECTED (A) NOT DETECTED Final    Comment: RESULT CALLED TO, READ BACK BY AND VERIFIED WITH: Dorothea Glassman 2/25 0834  MSG (NOTE) The GeneXpert MRSA Assay (FDA approved for NASAL specimens only), is one component of a comprehensive MRSA colonization surveillance program. It is not intended to diagnose MRSA infection nor to guide or monitor treatment for MRSA infections. Test performance is not FDA approved in patients less than 49 years old. Performed at [facility], [address] [place], [place] [address]     Radiology Report DG Chest Port 1 View Result Date: 08/10/2023 CLINICAL DATA:  Shortness of breath. EXAM: PORTABLE CHEST 1 VIEW COMPARISON:  08/08/2023 FINDINGS: Aortic atherosclerotic calcifications. Stable cardiomediastinal contours. Increased pulmonary vascular congestion. Increase opacity within the left lower lung identified which may reflect atelectasis, pneumonia or pleural effusion. Veil like opacification over the right lower lung may reflect a small posterior layering effusion. IMPRESSION: 1. Increased pulmonary vascular congestion. 2. Increased opacity within the left lower lung which may reflect atelectasis, pneumonia or pleural effusion. 3. Possible small posterior layering right pleural effusion. Electronically Signed   By: Signa Kell M.D.   On: 08/10/2023 06:46   DG Swallowing Func-Speech Pathology Result Date: 08/09/2023 Table formatting from the original result was not included. Modified Barium Swallow Study Patient Details Name: Luis [name] MRN: [id number] Date of Birth: [birth year] Today's Date: 08/09/2023 HPI/PMH: HPI: Leverne Amrhein is a 63 yo male presenting to ED 2/23 with one week history of progressive shortness of breath, productive cough, wheezing, and orthopnea. Admitted with acute hypoxic respiratory failure in the setting of CAP and COPD exacerbation. Seen by SLP 12/25/22 with recommendations to continue regular diet. PMH includes COPD, diastolic CHF, CHB/PPM, T2DM, ileostomy, chronic abdominal wound, tobacco/alcohol use  . Clinical Impression: Clinical Impression: Pt  exhibits an overall functional oropharyngeal swallow. He presents with increased WOB this date but protects his airway adequately with POs. He consistently has flash penetration with thin liquids that is independently cleared by subsequent swallows (PAS 2). This is considered WFL. No further penetration or aspiration occurred with any consistencies trialed. There is trace BOT residue that is ultimately cleared by subswallows. Provided education to pt regarding using increased caution in times of increased supplemental O2 requirement. Recommend he continue regular solids with thin liquids. No further SLP f/u is needed, will sign off. Factors that may increase risk of adverse event in presence of aspiration Rubye Oaks & Clearance Coots 2021): Factors that may increase risk of adverse event in presence of aspiration Rubye Oaks & Clearance Coots 2021): Poor general health and/or compromised immunity; Limited mobility Recommendations/Plan: Swallowing Evaluation Recommendations Swallowing Evaluation Recommendations Recommendations: PO diet PO Diet Recommendation: Regular; Thin liquids (Level 0) Liquid Administration via: Cup; Straw Medication Administration: Whole meds with liquid Supervision: Patient able to self-feed Swallowing strategies  : Minimize environmental distractions; Slow rate Postural changes: Position pt fully upright for meals Oral care recommendations: Oral care BID (2x/day) Treatment Plan Treatment Plan Treatment recommendations: No treatment recommended at this time Follow-up recommendations: No SLP follow up Recommendations Recommendations for follow up therapy are one component of a multi-disciplinary discharge planning process, led by the attending physician.  Recommendations may be updated based on patient status, additional functional criteria and insurance authorization. Assessment: Orofacial Exam: Orofacial Exam Oral Cavity: Oral Hygiene: WFL Oral Cavity - Dentition: Adequate natural dentition Orofacial Anatomy: WFL  Oral Motor/Sensory Function: WFL Anatomy: Anatomy: WFL Boluses Administered: Boluses Administered Boluses Administered:  Thin liquids (Level 0); Mildly thick liquids (Level 2, nectar thick); Moderately thick liquids (Level 3, honey thick); Puree; Solid  Oral Impairment Domain: Oral Impairment Domain Lip Closure: No labial escape Tongue control during bolus hold: Cohesive bolus between tongue to palatal seal Bolus preparation/mastication: Timely and efficient chewing and mashing Bolus transport/lingual motion: Brisk tongue motion Oral residue: Complete oral clearance Location of oral residue : N/A Initiation of pharyngeal swallow : Pyriform sinuses  Pharyngeal Impairment Domain: Pharyngeal Impairment Domain Soft palate elevation: No bolus between soft palate (SP)/pharyngeal wall (PW) Laryngeal elevation: Complete superior movement of thyroid cartilage with complete approximation of arytenoids to epiglottic petiole Anterior hyoid excursion: Complete anterior movement Epiglottic movement: Complete inversion Laryngeal vestibule closure: Complete, no air/contrast in laryngeal vestibule Pharyngeal stripping wave : Present - complete Pharyngeal contraction (A/P view only): N/A Pharyngoesophageal segment opening: Complete distension and complete duration, no obstruction of flow Tongue base retraction: Trace column of contrast or air between tongue base and PPW Pharyngeal residue: Trace residue within or on pharyngeal structures Location of pharyngeal residue: Tongue base  Esophageal Impairment Domain: Esophageal Impairment Domain Esophageal clearance upright position: Complete clearance, esophageal coating Pill: Pill Consistency administered: Thin liquids (Level 0) Thin liquids (Level 0): [facility] Penetration/Aspiration Scale Score: Penetration/Aspiration Scale Score 1.  Material does not enter airway: Mildly thick liquids (Level 2, nectar thick); Moderately thick liquids (Level 3, honey thick); Puree; Solid; Pill 2.  Material  enters airway, remains ABOVE vocal cords then ejected out: Thin liquids (Level 0) Compensatory Strategies: Compensatory Strategies Compensatory strategies: Yes Straw: Effective Effective Straw: Thin liquid (Level 0)   General Information: Caregiver present: No  Diet Prior to this Study: Regular; Thin liquids (Level 0)   Temperature : Normal   Respiratory Status: Increased WOB   Supplemental O2: High flow nasal cannula   History of Recent Intubation: No  Behavior/Cognition: Alert; Cooperative Self-Feeding Abilities: Able to self-feed Baseline vocal quality/speech: Dysphonic Volitional Cough: Able to elicit Volitional Swallow: Able to elicit Exam Limitations: No limitations Goal Planning: Prognosis for improved oropharyngeal function: Good Barriers to Reach Goals: Overall medical prognosis No data recorded Patient/Family Stated Goal: none stated Consulted and agree with results and recommendations: Patient Pain: Pain Assessment Pain Assessment: No/denies pain End of Session: Start Time:SLP Start Time (ACUTE ONLY): 1158 Stop Time: SLP Stop Time (ACUTE ONLY): 1213 Time Calculation:SLP Time Calculation (min) (ACUTE ONLY): 15 min Charges: SLP Evaluations $ SLP Speech Visit: 1 Visit SLP Evaluations $BSS Swallow: 1 Procedure $MBS Swallow: 1 Procedure SLP visit diagnosis: SLP Visit Diagnosis: Dysphagia, oropharyngeal phase (R13.12) Past Medical History: Past Medical History: Diagnosis Date  Acute respiratory failure with hypoxia (HCC) 12/23/2022  Alcohol withdrawal syndrome, with delirium (HCC) 12/27/2022  COPD (chronic obstructive pulmonary disease) (HCC)   Diabetes mellitus without complication (HCC)   Diverticulitis   DVT (deep venous thrombosis) (HCC)   x3  Encephalopathy acute 12/28/2022  ETOH abuse   H/O colectomy   Hyperlipidemia   Hypertension   Sepsis associated hypotension (HCC) 08/03/2019  Smoker  Past Surgical History: Past Surgical History: Procedure Laterality Date  COLOSTOMY    PACEMAKER LEADLESS INSERTION N/A  12/31/2020  Procedure: PACEMAKER LEADLESS INSERTION;  Surgeon: [name], MD;  Location: [facility];  Service: Cardiovascular;  Laterality: N/A;  TONSILLECTOMY   Gwynneth Aliment, M.A., CF-SLP Speech Language Pathology, Acute Rehabilitation Services Secure Chat preferred [phone] 08/09/2023, 2:01 PM    Signature  -   Susa Raring M.D on 08/10/2023 at 9:35 AM   -  To page go to [email or web address]

## 2023-08-11 DIAGNOSIS — J189 Pneumonia, unspecified organism: Secondary | ICD-10-CM | POA: Diagnosis not present

## 2023-08-11 LAB — GLUCOSE, CAPILLARY
Glucose-Capillary: 172 mg/dL — ABNORMAL HIGH (ref 70–99)
Glucose-Capillary: 189 mg/dL — ABNORMAL HIGH (ref 70–99)
Glucose-Capillary: 182 mg/dL — ABNORMAL HIGH (ref 70–99)
Glucose-Capillary: 190 mg/dL — ABNORMAL HIGH (ref 70–99)

## 2023-08-11 LAB — COMPREHENSIVE METABOLIC PANEL
ALT: 136 U/L — ABNORMAL HIGH (ref 0–44)
AST: 76 U/L — ABNORMAL HIGH (ref 15–41)
Albumin: 3.9 g/dL (ref 3.5–5.0)
Alkaline Phosphatase: 123 U/L (ref 38–126)
Anion gap: 11 (ref 5–15)
BUN: 36 mg/dL — ABNORMAL HIGH (ref 8–23)
CO2: 29 mmol/L (ref 22–32)
Calcium: 9.4 mg/dL (ref 8.9–10.3)
Chloride: 97 mmol/L — ABNORMAL LOW (ref 98–111)
Creatinine, Ser: 1 mg/dL (ref 0.61–1.24)
GFR, Estimated: >60 mL/min (ref >60)
Glucose, Bld: 151 mg/dL — ABNORMAL HIGH (ref 70–99)
Potassium: 4.2 mmol/L (ref 3.5–5.1)
Sodium: 137 mmol/L (ref 135–145)
Total Bilirubin: 1.1 mg/dL (ref 0.0–1.2)
Total Protein: 7.8 g/dL (ref 6.5–8.1)

## 2023-08-11 LAB — CBC WITH DIFFERENTIAL/PLATELET
Abs Immature Granulocytes: 0.13 10*3/uL — ABNORMAL HIGH (ref 0.00–0.07)
Basophils Absolute: 0 10*3/uL (ref 0.0–0.1)
Basophils Relative: 0 %
Eosinophils Absolute: 0 10*3/uL (ref 0.0–0.5)
Eosinophils Relative: 0 %
HCT: 40.8 % (ref 39.0–52.0)
Hemoglobin: 13.8 g/dL (ref 13.0–17.0)
Immature Granulocytes: 1 %
Lymphocytes Relative: 12 %
Lymphs Abs: 1.3 10*3/uL (ref 0.7–4.0)
MCH: 34.2 pg — ABNORMAL HIGH (ref 26.0–34.0)
MCHC: 33.8 g/dL (ref 30.0–36.0)
MCV: 101.2 fL — ABNORMAL HIGH (ref 80.0–100.0)
Monocytes Absolute: 1.1 10*3/uL — ABNORMAL HIGH (ref 0.1–1.0)
Monocytes Relative: 11 %
Neutro Abs: 8 10*3/uL — ABNORMAL HIGH (ref 1.7–7.7)
Neutrophils Relative %: 76 %
Platelets: 212 10*3/uL (ref 150–400)
RBC: 4.03 MIL/uL — ABNORMAL LOW (ref 4.22–5.81)
RDW: 14.5 % (ref 11.5–15.5)
WBC: 10.6 10*3/uL — ABNORMAL HIGH (ref 4.0–10.5)
nRBC: 0 % (ref 0.0–0.2)

## 2023-08-11 LAB — CULTURE, BLOOD (ROUTINE X 2)
Culture: NO GROWTH
Culture: NO GROWTH
Special Requests: ADEQUATE
Special Requests: ADEQUATE

## 2023-08-11 LAB — BRAIN NATRIURETIC PEPTIDE: B Natriuretic Peptide: 357.6 pg/mL — ABNORMAL HIGH (ref 0.0–100.0)

## 2023-08-11 LAB — PROCALCITONIN: Procalcitonin: 0.43 ng/mL

## 2023-08-11 LAB — C-REACTIVE PROTEIN: CRP: 1.6 mg/dL — ABNORMAL HIGH (ref <1.0)

## 2023-08-11 LAB — MAGNESIUM: Magnesium: 2.4 mg/dL (ref 1.7–2.4)

## 2023-08-11 MED ORDER — FUROSEMIDE 10 MG/ML IJ SOLN
60.0000 mg | Freq: Once | INTRAMUSCULAR | Status: AC
  Administered 2023-08-11: 60 mg via INTRAVENOUS
  Filled 2023-08-11: qty 6

## 2023-08-11 MED ORDER — METHYLPREDNISOLONE SODIUM SUCC 40 MG IJ SOLR
30.0000 mg | Freq: Every day | INTRAMUSCULAR | Status: DC
  Administered 2023-08-11 – 2023-08-13 (×3): 30 mg via INTRAVENOUS
  Filled 2023-08-11 (×3): qty 1

## 2023-08-11 MED ORDER — IPRATROPIUM-ALBUTEROL 0.5-2.5 (3) MG/3ML IN SOLN
3.0000 mL | Freq: Two times a day (BID) | RESPIRATORY_TRACT | Status: DC
  Administered 2023-08-11 – 2023-08-14 (×6): 3 mL via RESPIRATORY_TRACT
  Filled 2023-08-11 (×6): qty 3

## 2023-08-11 MED ORDER — IPRATROPIUM-ALBUTEROL 0.5-2.5 (3) MG/3ML IN SOLN
3.0000 mL | Freq: Three times a day (TID) | RESPIRATORY_TRACT | Status: DC
  Administered 2023-08-11: 3 mL via RESPIRATORY_TRACT
  Filled 2023-08-11: qty 3

## 2023-08-11 MED ORDER — HYDROXYZINE HCL 25 MG PO TABS
25.0000 mg | ORAL_TABLET | Freq: Three times a day (TID) | ORAL | Status: DC | PRN
  Administered 2023-08-11 – 2023-08-12 (×2): 25 mg via ORAL
  Filled 2023-08-11 (×2): qty 1

## 2023-08-11 MED ORDER — INSULIN GLARGINE 100 UNIT/ML ~~LOC~~ SOLN
30.0000 [IU] | Freq: Every day | SUBCUTANEOUS | Status: DC
  Administered 2023-08-11 – 2023-08-12 (×2): 30 [IU] via SUBCUTANEOUS
  Filled 2023-08-11 (×2): qty 0.3

## 2023-08-11 MED ORDER — METOLAZONE 2.5 MG PO TABS
2.5000 mg | ORAL_TABLET | Freq: Once | ORAL | Status: AC
  Administered 2023-08-11: 2.5 mg via ORAL
  Filled 2023-08-11: qty 1

## 2023-08-11 NOTE — Plan of Care (Signed)

## 2023-08-11 NOTE — Progress Notes (Signed)
 PROGRESS NOTE                                                                                                                                                                                                             Patient Demographics:    Luis [name], is a 63 y.o. male, DOB - 03/18/1961, ZHY:[id number]  Outpatient Primary MD for the patient is [name], NP    LOS - 5  Admit date - 08/06/2023    Chief Complaint  Patient presents with   Shortness of Breath       Brief Narrative (HPI from H&P)   63 year old M with PMH of COPD, diastolic CHF, CHB/PPM, DM-2, ileostomy, chronic abdominal wound and tobacco use disorder presenting with progressive shortness of breath for about a week, productive cough for 2 to 3 days, fever, wheezing and orthopnea, and admitted with working diagnosis of acute hypoxic respiratory failure in the setting of community-acquired pneumonia and COPD exacerbation.     Subjective:   Patient in bed, appears comfortable, denies any headache, no fever, no chest pain or pressure, improving shortness of breath , no abdominal pain. No new focal weakness.    Assessment  & Plan :   Acute respiratory failure with hypoxia due to pneumonia community-acquired versus aspiration, COPD exacerbation and forcible acute on chronic diastolic CHF EF 55% on echocardiogram few months ago. he is an alcoholic and still consumes large amount of alcohol and continuously smokes, high risk for aspiration, did have leukocytosis and elevated procalcitonin, we will place him on Unasyn along with azithromycin, continue steroids and diuretics as needed, encouraged to sit in chair use I-S and flutter valve for pulmonary toiletry.  Advance activity and titrate down oxygen.  Clinically has improved, strictly counseled to quit alcohol and smoking.  Speech therapy to evaluate.   SpO2: 93 % O2 Flow Rate (L/min): 3 L/min  COPD  exacerbation: As above, minimal wheezing start tapering steroids.   Acute on chronic diastolic CHF: TTE in 12/2022 with LVEF of 50 to 55%, G2-DD.  He is on torsemide and Farxiga at home.  Minimal crackles, minimal orthopnea, Lasix + Zaroxolyn on 08/11/2023, SOB improved.  AKI: Baseline Cr 0.8-0.9 although his creatinine is 1.23 in 02/2023.   Uncontrolled hypertension: On beta-blocker and Imdur, monitor ARB held due to hyperkalemia and renal dysfunction.  Complete heart block s/p PPM: EKG with V paced rhythm and flutter but rate controlled. Continue home Toprol-XL   Ileostomy/chronic abdominal wound: Goes to outpatient wound care. Consulted WOCN   Elevated troponin: Minimal elevation in non-ACS pattern, no chest pain, no further workup, likely demand mismatch from hypoxia.   Lactic acidosis: Likely due to AKI, respiratory failure and metformin.  Supportive care and monitor    Hyponatremia.  Hold further Lasix and monitor.    Hyperkalemia: Lokelma.    Alcohol abuse and tobacco use disorder: Been counseled to quit both, CIWA protocol and NicoDerm patch.  Seems to be slightly jittery on 08/10/2023 add low-dose Librium.  Seems to be in early DTs.   At risk for sleep apnea -Needs outpatient evaluation but has claustrophobia, current oxygen here.  Not on CPAP.   Obesity  Body mass index is 31.12 kg/m.  With PCP for weight loss.  NIDDM-2 with hyperglycemia: A1c 7.3%.  On metformin and Farxiga at home, CBGs elevated due to high-dose steroids, steroids being tapered down, insulin dose adjusted on 08/11/2023, monitor closely, CBGs can fall precipitously once steroids are withdrawn.   CBG (last 3)  Recent Labs    08/10/23 1211 08/10/23 1800 08/10/23 2106  GLUCAP 179* 244* 300*         Condition - Fair  Family Communication  :  None  Code Status :  Full  Consults  :  None  PUD Prophylaxis :    Procedures  :     CT Abd - Changes of fatty liver, hepatomegaly and cirrhosis, stable.  Small amount of perihepatic ascites. 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 No bowel obstruction. Aortoiliac atherosclerosis. No acute findings.   CT Chest - No evidence of pulmonary embolus. Cardiomegaly, coronary artery disease. Bronchial wall thickening compatible with bronchitis. This is similar to prior study. Continued bibasilar airspace opacities, atelectasis versus pneumonia. Opacities less pronounced than prior study. Aortic Atherosclerosis (ICD10-I70.0).      Disposition Plan  :    Status is: Inpatient  DVT Prophylaxis  :    SCDs Start: 08/06/23 2142  Lab Results  Component Value Date   PLT 212 08/11/2023    Diet :  Diet Order             Diet heart healthy/carb modified Room service appropriate? Yes; Fluid consistency: Thin  Diet effective now                    Inpatient Medications  Scheduled Meds:  amLODipine  10 mg Oral Daily   atorvastatin  10 mg Oral Daily   chlordiazePOXIDE  10 mg Oral TID   dapagliflozin propanediol  10 mg Oral QAC breakfast   docusate sodium  200 mg Oral BID   folic acid  1 mg Oral Daily   furosemide  60 mg Intravenous Once   gabapentin  300 mg Oral BID   guaiFENesin  600 mg Oral BID   insulin aspart  0-20 Units Subcutaneous TID WC   insulin aspart  0-5 Units Subcutaneous QHS   insulin aspart  6 Units Subcutaneous TID WC   insulin glargine  30 Units Subcutaneous Daily   ipratropium-albuterol  3 mL Nebulization TID   isosorbide mononitrate  30 mg Oral Daily   loratadine  10 mg Oral Daily   methylPREDNISolone (SOLU-MEDROL) injection  30 mg Intravenous Daily   metolazone  2.5 mg Oral Once   metoprolol succinate  25 mg Oral Daily  multivitamin with minerals  1 tablet Oral Daily   mupirocin ointment   Nasal BID   nicotine  21 mg Transdermal Daily   polyethylene glycol  17 g Oral BID   thiamine  100 mg Oral Daily   Continuous Infusions:  ampicillin-sulbactam (UNASYN) IV 3 g (08/11/23  0537)   PRN Meds:.acetaminophen **OR** acetaminophen, albuterol, antiseptic oral rinse, benzonatate, LORazepam **OR** LORazepam, melatonin, ondansetron (ZOFRAN) IV, phenol, sodium chloride    Objective:   Vitals:   08/10/23 2014 08/11/23 0500 08/11/23 0504 08/11/23 0518  BP: (!) 145/101  (!) 166/91   Pulse: 61  60   Resp: 16  18   Temp: 97.9 F (36.6 C)  97.7 F (36.5 C)   TempSrc: Oral  Oral   SpO2: 92%   93%  Weight:  104.9 kg    Height:        Wt Readings from Last 3 Encounters:  08/11/23 104.9 kg  07/17/23 107 kg  06/15/23 106.1 kg     Intake/Output Summary (Last 24 hours) at 08/11/2023 1610 Last data filed at 08/10/2023 2245 Gross per 24 hour  Intake 480 ml  Output 2950 ml  Net -2470 ml     Physical Exam  Awake Alert, No new F.N deficits, Normal affect [place].AT,PERRAL Supple Neck, No JVD,   Symmetrical Chest wall movement, Good air movement bilaterally, minimal wheezing RRR,No Gallops,Rubs or new Murmurs,  +ve B.Sounds, Abd Soft, with chronic midline abdominal incision under bandage with colostomy bag No Cyanosis, Clubbing or edema       Data Review:    Recent Labs  Lab 08/07/23 0319 08/08/23 0427 08/09/23 0446 08/10/23 0514 08/11/23 0528  WBC 10.1 13.4* 13.8* 11.5* 10.6*  HGB 12.6* 12.3* 12.5* 12.8* 13.8  HCT 37.8* 37.2* 38.4* 38.6* 40.8  PLT 190 198 201 216 212  MCV 102.2* 103.3* 103.8* 103.5* 101.2*  MCH 34.1* 34.2* 33.8 34.3* 34.2*  MCHC 33.3 33.1 32.6 33.2 33.8  RDW 14.8 15.1 14.9 14.9 14.5  LYMPHSABS 0.4*  --  0.6* 1.1 1.3  MONOABS 0.2  --  1.1* 1.2* 1.1*  EOSABS 0.0  --  0.0 0.0 0.0  BASOSABS 0.0  --  0.0 0.0 0.0    Recent Labs  Lab 08/06/23 1650 08/06/23 1650 08/06/23 1657 08/06/23 1930 08/06/23 2315 08/07/23 0319 08/07/23 1841 08/08/23 0425 08/08/23 0427 08/09/23 0446 08/10/23 0514 08/11/23 0528  NA 133*  --   --   --   --  129* 131*  --  130* 136 140 137  K 5.0  --   --   --   --  6.0* 6.0*  --  5.2* 4.8 4.3 4.2  CL 93*   --   --   --   --  96* 97*  --  100 102 102 97*  CO2 24  --   --   --   --  21* 23  --  23 22 28 29   ANIONGAP 16*  --   --   --   --  12 11  --  7 12 10 11   GLUCOSE 129*  --   --   --   --  255* 253*  --  324* 226* 138* 151*  BUN 25*  --   --   --   --  28* 38*  --  40* 38* 30* 36*  CREATININE 1.32*  --   --   --   --  1.39* 1.54*  --  1.31* 1.15  0.99 1.00  AST 32  --   --   --   --  31  --   --  32  --  77* 76*  ALT 29  --   --   --   --  31  --   --  30  --  96* 136*  ALKPHOS 118  --   --   --   --  110  --   --  111  --  116 123  BILITOT 0.9  --   --   --   --  1.0  --   --  0.7  --  1.1 1.1  ALBUMIN 3.9  --   --   --   --  3.7 3.7  --  3.7  --  4.0 3.9  CRP  --   --   --   --   --   --   --   --  3.8* 1.4* 0.6 1.6*  PROCALCITON  --   --   --   --  7.67  --   --   --  4.53 1.82 0.77  --   LATICACIDVEN  --   --  3.6* 3.2*  --  1.9  --   --   --   --   --   --   INR  --   --   --   --   --  1.1  --   --   --   --   --   --   HGBA1C  --   --   --   --  7.3*  --   --   --   --   --   --   --   BNP 204.4*  --   --   --   --   --   --  527.4*  --  611.2* 513.2* 357.6*  MG  --    < >  --   --  2.1 2.2  --   --  2.6* 2.7* 2.5* 2.4  PHOS  --   --   --   --   --  4.4 2.7  --  3.9  --   --   --   CALCIUM 9.5  --   --   --   --  9.0 8.9  --  8.2* 9.0 9.1 9.4   < > = values in this interval not displayed.      Recent Labs  Lab 08/06/23 1650 08/06/23 1650 08/06/23 1657 08/06/23 1930 08/06/23 2315 08/07/23 0319 08/07/23 1841 08/08/23 0425 08/08/23 0427 08/09/23 0446 08/10/23 0514 08/11/23 0528  CRP  --   --   --   --   --   --   --   --  3.8* 1.4* 0.6 1.6*  PROCALCITON  --   --   --   --  7.67  --   --   --  4.53 1.82 0.77  --   LATICACIDVEN  --   --  3.6* 3.2*  --  1.9  --   --   --   --   --   --   INR  --   --   --   --   --  1.1  --   --   --   --   --   --   HGBA1C  --   --   --   --  7.3*  --   --   --   --   --   --   --   BNP 204.4*  --   --   --   --   --   --  527.4*  --   611.2* 513.2* 357.6*  MG  --    < >  --   --  2.1 2.2  --   --  2.6* 2.7* 2.5* 2.4  CALCIUM 9.5  --   --   --   --  9.0 8.9  --  8.2* 9.0 9.1 9.4   < > = values in this interval not displayed.    --------------------------------------------------------------------------------------------------------------- Lab Results  Component Value Date   CHOL 127 11/04/2022   HDL 32.50 (L) 11/04/2022   LDLCALC 53 12/26/2020   LDLDIRECT 51.0 11/04/2022   TRIG 522 (H) 12/26/2022   CHOLHDL 4 11/04/2022    Lab Results  Component Value Date   HGBA1C 7.3 (H) 08/06/2023   No results for input(s): "TSH", "T4TOTAL", "FREET4", "T3FREE", "THYROIDAB" in the last 72 hours. No results for input(s): "VITAMINB12", "FOLATE", "FERRITIN", "TIBC", "IRON", "RETICCTPCT" in the last 72 hours. ------------------------------------------------------------------------------------------------------------------ Cardiac Enzymes No results for input(s): "CKMB", "TROPONINI", "MYOGLOBIN" in the last 168 hours.  Invalid input(s): "CK"  Micro Results Recent Results (from the past 240 hours)  Resp panel by RT-PCR (RSV, Flu A&B, Covid) Anterior Nasal Swab     Status: None   Collection Time: 08/06/23  4:07 PM   Specimen: Anterior Nasal Swab  Result Value Ref Range Status   SARS Coronavirus 2 by RT PCR NEGATIVE NEGATIVE Final   Influenza A by PCR NEGATIVE NEGATIVE Final   Influenza B by PCR NEGATIVE NEGATIVE Final    Comment: (NOTE) The Xpert Xpress SARS-CoV-2/FLU/RSV plus assay is intended as an aid in the diagnosis of influenza from Nasopharyngeal swab specimens and should not be used as a sole basis for treatment. Nasal washings and aspirates are unacceptable for Xpert Xpress SARS-CoV-2/FLU/RSV testing.  Fact Sheet for Patients: BloggerCourse.com  Fact Sheet for Healthcare Providers: SeriousBroker.it  This test is not yet approved or cleared by the [place] FDA  and has been authorized for detection and/or diagnosis of SARS-CoV-2 by FDA under an Emergency Use Authorization (EUA). This EUA will remain in effect (meaning this test can be used) for the duration of the COVID-19 declaration under Section 564(b)(1) of the Act, 21 U.S.C. section 360bbb-3(b)(1), unless the authorization is terminated or revoked.     Resp Syncytial Virus by PCR NEGATIVE NEGATIVE Final    Comment: (NOTE) Fact Sheet for Patients: BloggerCourse.com  Fact Sheet for Healthcare Providers: SeriousBroker.it  This test is not yet approved or cleared by the [place] FDA and has been authorized for detection and/or diagnosis of SARS-CoV-2 by FDA under an Emergency Use Authorization (EUA). This EUA will remain in effect (meaning this test can be used) for the duration of the COVID-19 declaration under Section 564(b)(1) of the Act, 21 U.S.C. section 360bbb-3(b)(1), unless the authorization is terminated or revoked.  Performed at [facility], [address], [place], [place] [address]   Blood culture (routine x 2)     Status: None   Collection Time: 08/06/23  5:00 PM   Specimen: BLOOD  Result Value Ref Range Status   Specimen Description BLOOD SITE NOT SPECIFIED  Final   Special Requests   Final    BOTTLES DRAWN AEROBIC AND ANAEROBIC Blood Culture adequate volume   Culture  Final    NO GROWTH 5 DAYS Performed at [facility], [address], [place], [place] [address]    Report Status 08/11/2023 FINAL  Final  Blood culture (routine x 2)     Status: None   Collection Time: 08/06/23  5:20 PM   Specimen: BLOOD  Result Value Ref Range Status   Specimen Description BLOOD SITE NOT SPECIFIED  Final   Special Requests   Final    BOTTLES DRAWN AEROBIC AND ANAEROBIC Blood Culture adequate volume   Culture   Final    NO GROWTH 5 DAYS Performed at [facility], [address] [place], [place]  [address]    Report Status 08/11/2023 FINAL  Final  MRSA Next Gen by PCR, Nasal     Status: Abnormal   Collection Time: 08/08/23  5:48 AM   Specimen: Nasal Mucosa; Nasal Swab  Result Value Ref Range Status   MRSA by PCR Next Gen DETECTED (A) NOT DETECTED Final    Comment: RESULT CALLED TO, READ BACK BY AND VERIFIED WITH: Dorothea Glassman 2/25 0834 MSG (NOTE) The GeneXpert MRSA Assay (FDA approved for NASAL specimens only), is one component of a comprehensive MRSA colonization surveillance program. It is not intended to diagnose MRSA infection nor to guide or monitor treatment for MRSA infections. Test performance is not FDA approved in patients less than 73 years old. Performed at [facility], [address] [place], [place] [address]     Radiology Report DG Chest Port 1 View Result Date: 08/10/2023 CLINICAL DATA:  Shortness of breath. EXAM: PORTABLE CHEST 1 VIEW COMPARISON:  08/08/2023 FINDINGS: Aortic atherosclerotic calcifications. Stable cardiomediastinal contours. Increased pulmonary vascular congestion. Increase opacity within the left lower lung identified which may reflect atelectasis, pneumonia or pleural effusion. Veil like opacification over the right lower lung may reflect a small posterior layering effusion. IMPRESSION: 1. Increased pulmonary vascular congestion. 2. Increased opacity within the left lower lung which may reflect atelectasis, pneumonia or pleural effusion. 3. Possible small posterior layering right pleural effusion. Electronically Signed   By: Signa Kell M.D.   On: 08/10/2023 06:46   DG Swallowing Func-Speech Pathology Result Date: 08/09/2023 Table formatting from the original result was not included. Modified Barium Swallow Study Patient Details Name: Coston Mandato MRN: [id number] Date of Birth: 1961-04-20 Today's Date: 08/09/2023 HPI/PMH: HPI: Gaege Sangalang is a 63 yo male presenting to ED 2/23 with one week history of progressive shortness of breath,  productive cough, wheezing, and orthopnea. Admitted with acute hypoxic respiratory failure in the setting of CAP and COPD exacerbation. Seen by SLP 12/25/22 with recommendations to continue regular diet. PMH includes COPD, diastolic CHF, CHB/PPM, T2DM, ileostomy, chronic abdominal wound, tobacco/alcohol use  . Clinical Impression: Clinical Impression: Pt exhibits an overall functional oropharyngeal swallow. He presents with increased WOB this date but protects his airway adequately with POs. He consistently has flash penetration with thin liquids that is independently cleared by subsequent swallows (PAS 2). This is considered WFL. No further penetration or aspiration occurred with any consistencies trialed. There is trace BOT residue that is ultimately cleared by subswallows. Provided education to pt regarding using increased caution in times of increased supplemental O2 requirement. Recommend he continue regular solids with thin liquids. No further SLP f/u is needed, will sign off. Factors that may increase risk of adverse event in presence of aspiration Rubye Oaks & Clearance Coots 2021): Factors that may increase risk of adverse event in presence of aspiration Rubye Oaks & Clearance Coots  2021): Poor general health and/or compromised immunity; Limited mobility Recommendations/Plan: Swallowing Evaluation Recommendations Swallowing Evaluation Recommendations Recommendations: PO diet PO Diet Recommendation: Regular; Thin liquids (Level 0) Liquid Administration via: Cup; Straw Medication Administration: Whole meds with liquid Supervision: Patient able to self-feed Swallowing strategies  : Minimize environmental distractions; Slow rate Postural changes: Position pt fully upright for meals Oral care recommendations: Oral care BID (2x/day) Treatment Plan Treatment Plan Treatment recommendations: No treatment recommended at this time Follow-up recommendations: No SLP follow up Recommendations Recommendations for follow up therapy are one  component of a multi-disciplinary discharge planning process, led by the attending physician.  Recommendations may be updated based on patient status, additional functional criteria and insurance authorization. Assessment: Orofacial Exam: Orofacial Exam Oral Cavity: Oral Hygiene: WFL Oral Cavity - Dentition: Adequate natural dentition Orofacial Anatomy: WFL Oral Motor/Sensory Function: WFL Anatomy: Anatomy: WFL Boluses Administered: Boluses Administered Boluses Administered: Thin liquids (Level 0); Mildly thick liquids (Level 2, nectar thick); Moderately thick liquids (Level 3, honey thick); Puree; Solid  Oral Impairment Domain: Oral Impairment Domain Lip Closure: No labial escape Tongue control during bolus hold: Cohesive bolus between tongue to palatal seal Bolus preparation/mastication: Timely and efficient chewing and mashing Bolus transport/lingual motion: Brisk tongue motion Oral residue: Complete oral clearance Location of oral residue : N/A Initiation of pharyngeal swallow : Pyriform sinuses  Pharyngeal Impairment Domain: Pharyngeal Impairment Domain Soft palate elevation: No bolus between soft palate (SP)/pharyngeal wall (PW) Laryngeal elevation: Complete superior movement of thyroid cartilage with complete approximation of arytenoids to epiglottic petiole Anterior hyoid excursion: Complete anterior movement Epiglottic movement: Complete inversion Laryngeal vestibule closure: Complete, no air/contrast in laryngeal vestibule Pharyngeal stripping wave : Present - complete Pharyngeal contraction (A/P view only): N/A Pharyngoesophageal segment opening: Complete distension and complete duration, no obstruction of flow Tongue base retraction: Trace column of contrast or air between tongue base and PPW Pharyngeal residue: Trace residue within or on pharyngeal structures Location of pharyngeal residue: Tongue base  Esophageal Impairment Domain: Esophageal Impairment Domain Esophageal clearance upright position:  Complete clearance, esophageal coating Pill: Pill Consistency administered: Thin liquids (Level 0) Thin liquids (Level 0): [facility] Penetration/Aspiration Scale Score: Penetration/Aspiration Scale Score 1.  Material does not enter airway: Mildly thick liquids (Level 2, nectar thick); Moderately thick liquids (Level 3, honey thick); Puree; Solid; Pill 2.  Material enters airway, remains ABOVE vocal cords then ejected out: Thin liquids (Level 0) Compensatory Strategies: Compensatory Strategies Compensatory strategies: Yes Straw: Effective Effective Straw: Thin liquid (Level 0)   General Information: Caregiver present: No  Diet Prior to this Study: Regular; Thin liquids (Level 0)   Temperature : Normal   Respiratory Status: Increased WOB   Supplemental O2: High flow nasal cannula   History of Recent Intubation: No  Behavior/Cognition: Alert; Cooperative Self-Feeding Abilities: Able to self-feed Baseline vocal quality/speech: Dysphonic Volitional Cough: Able to elicit Volitional Swallow: Able to elicit Exam Limitations: No limitations Goal Planning: Prognosis for improved oropharyngeal function: Good Barriers to Reach Goals: Overall medical prognosis No data recorded Patient/Family Stated Goal: none stated Consulted and agree with results and recommendations: Patient Pain: Pain Assessment Pain Assessment: No/denies pain End of Session: Start Time:SLP Start Time (ACUTE ONLY): 1158 Stop Time: SLP Stop Time (ACUTE ONLY): 1213 Time Calculation:SLP Time Calculation (min) (ACUTE ONLY): 15 min Charges: SLP Evaluations $ SLP Speech Visit: 1 Visit SLP Evaluations $BSS Swallow: 1 Procedure $MBS Swallow: 1 Procedure SLP visit diagnosis: SLP Visit Diagnosis: Dysphagia, oropharyngeal phase (R13.12) Past Medical History: Past Medical History: Diagnosis Date  Acute respiratory failure with hypoxia (HCC) 12/23/2022  Alcohol withdrawal syndrome, with delirium (HCC) 12/27/2022  COPD (chronic obstructive pulmonary disease) (HCC)   Diabetes  mellitus without complication (HCC)   Diverticulitis   DVT (deep venous thrombosis) (HCC)   x3  Encephalopathy acute 12/28/2022  ETOH abuse   H/O colectomy   Hyperlipidemia   Hypertension   Sepsis associated hypotension (HCC) 08/03/2019  Smoker  Past Surgical History: Past Surgical History: Procedure Laterality Date  COLOSTOMY    PACEMAKER LEADLESS INSERTION N/A 12/31/2020  Procedure: PACEMAKER LEADLESS INSERTION;  Surgeon: [name], MD;  Location: [facility];  Service: Cardiovascular;  Laterality: N/A;  TONSILLECTOMY   Gwynneth Aliment, M.A., CF-SLP Speech Language Pathology, Acute Rehabilitation Services Secure Chat preferred [phone] 08/09/2023, 2:01 PM    Signature  -   Susa Raring M.D on 08/11/2023 at 8:08 AM   -  To page go to [email or web address]

## 2023-08-11 NOTE — Progress Notes (Signed)
 Occupational Therapy Treatment Patient Details Name: Luis [name] MRN: [id number] DOB: 1960-10-04 Today's Date: 08/11/2023   History of present illness 63 year old M presenting with progressive shortness of breath for about a week, productive cough for 2 to 3 days, fever, wheezing and orthopnea, and admitted with working diagnosis of acute hypoxic respiratory failure in the setting of community-acquired pneumonia and COPD exacerbation. PMH of COPD, diastolic CHF, CHB/PPM, DM-2, ileostomy, chronic abdominal wound and tobacco use   OT comments  Pt making steady progress towards OT goals with emphasis on standing tolerance during ADLs and O2 assessments. Pt able to mobilize in room without AD and without assistance, manage various ADLs without assistance though SpO2 noted to drop to 87% on RA. Pt required 3 L O2 and 3 min to recover to 95% at end of session. 1/4 DOE.      If plan is discharge home, recommend the following:  Other (comment) (PRN)   Equipment Recommendations  None recommended by OT    Recommendations for Other Services      Precautions / Restrictions Precautions Precautions: Fall Recall of Precautions/Restrictions: Intact Precaution/Restrictions Comments: watch O2 (does not wear at baseline), ostomy Restrictions Weight Bearing Restrictions Per Provider Order: No       Mobility Bed Mobility               General bed mobility comments: Seated EOB    Transfers Overall transfer level: Independent Equipment used: None Transfers: Sit to/from Stand Sit to Stand: Independent                 Balance Overall balance assessment: No apparent balance deficits (not formally assessed)                                         ADL either performed or assessed with clinical judgement   ADL Overall ADL's : Needs assistance/impaired     Grooming: Independent;Standing;Wash/dry face;Brushing hair Grooming Details (indicate cue type and reason):  with OT assist to wash hair standing at sink, no LOB. able to stand 10 min Upper Body Bathing: Set up;Standing                           Functional mobility during ADLs: Supervision/safety General ADL Comments: emphasis on standing tolerance for ADLs, O2 assessments. Pt requiring encouragement to participate    Extremity/Trunk Assessment Upper Extremity Assessment Upper Extremity Assessment: Overall WFL for tasks assessed;Right hand dominant   Lower Extremity Assessment Lower Extremity Assessment: Defer to PT evaluation        Vision   Vision Assessment?: No apparent visual deficits   Perception     Praxis     Communication Communication Communication: No apparent difficulties   Cognition Arousal: Alert Behavior During Therapy: WFL for tasks assessed/performed, Restless Cognition: No family/caregiver present to determine baseline             OT - Cognition Comments: likely at baseline cognitively but not formally assessed. pt follows directions, shows insight into deficits but hyperverbose and sarcastic at times                 Following commands: Intact        Cueing   Cueing Techniques: Verbal cues  Exercises      Shoulder Instructions       General Comments      Pertinent  Vitals/ Pain       Pain Assessment Pain Assessment: No/denies pain  Home Living                                          Prior Functioning/Environment              Frequency  Min 1X/week        Progress Toward Goals  OT Goals(current goals can now be found in the care plan section)  Progress towards OT goals: Progressing toward goals  Acute Rehab OT Goals Patient Stated Goal: go home asap, not go home with O2 OT Goal Formulation: With patient Time For Goal Achievement: 08/22/23 Potential to Achieve Goals: Good ADL Goals Pt Will Transfer to Toilet: with modified independence;ambulating Additional ADL Goal #1: Pt to verbalize at  least 3 energy conservation strategies to implement at home Additional ADL Goal #2: Pt to increase standing activity tolerance > 10 min during ADLs/mobility before seated rest break needed  Plan      Co-evaluation                 AM-PAC OT "6 Clicks" Daily Activity     Outcome Measure   Help from another person eating meals?: None Help from another person taking care of personal grooming?: None Help from another person toileting, which includes using toliet, bedpan, or urinal?: A Little Help from another person bathing (including washing, rinsing, drying)?: A Little Help from another person to put on and taking off regular upper body clothing?: A Little Help from another person to put on and taking off regular lower body clothing?: A Little 6 Click Score: 20    End of Session Equipment Utilized During Treatment: Oxygen  OT Visit Diagnosis: Other abnormalities of gait and mobility (R26.89);Muscle weakness (generalized) (M62.81)   Activity Tolerance Patient tolerated treatment well   Patient Left in bed;with call bell/phone within reach   Nurse Communication Mobility status        Time: 1610-9604 OT Time Calculation (min): 30 min  Charges: OT General Charges $OT Visit: 1 Visit OT Treatments $Self Care/Home Management : 8-22 mins $Therapeutic Activity: 8-22 mins  Bradd Canary, OTR/L Acute Rehab Services Office: [phone]   Lorre Munroe 08/11/2023, 10:52 AM

## 2023-08-12 ENCOUNTER — Inpatient Hospital Stay (HOSPITAL_COMMUNITY)

## 2023-08-12 DIAGNOSIS — J189 Pneumonia, unspecified organism: Secondary | ICD-10-CM | POA: Diagnosis not present

## 2023-08-12 LAB — GLUCOSE, CAPILLARY
Glucose-Capillary: 246 mg/dL — ABNORMAL HIGH (ref 70–99)
Glucose-Capillary: 281 mg/dL — ABNORMAL HIGH (ref 70–99)
Glucose-Capillary: 245 mg/dL — ABNORMAL HIGH (ref 70–99)
Glucose-Capillary: 160 mg/dL — ABNORMAL HIGH (ref 70–99)

## 2023-08-12 MED ORDER — FUROSEMIDE 10 MG/ML IJ SOLN
60.0000 mg | Freq: Two times a day (BID) | INTRAMUSCULAR | Status: DC
  Administered 2023-08-12: 60 mg via INTRAVENOUS
  Filled 2023-08-12: qty 6

## 2023-08-12 MED ORDER — INSULIN GLARGINE 100 UNIT/ML ~~LOC~~ SOLN
20.0000 [IU] | Freq: Every day | SUBCUTANEOUS | Status: DC
  Administered 2023-08-13 – 2023-08-14 (×2): 20 [IU] via SUBCUTANEOUS
  Filled 2023-08-12 (×2): qty 0.2

## 2023-08-12 MED ORDER — FUROSEMIDE 10 MG/ML IJ SOLN
60.0000 mg | Freq: Every day | INTRAMUSCULAR | Status: DC
Start: 2023-08-13 — End: 2023-08-13

## 2023-08-12 MED ORDER — METOLAZONE 2.5 MG PO TABS
2.5000 mg | ORAL_TABLET | Freq: Once | ORAL | Status: AC
  Administered 2023-08-12: 2.5 mg via ORAL
  Filled 2023-08-12: qty 1

## 2023-08-12 NOTE — Progress Notes (Signed)
 Mobility Specialist: Progress Note   08/12/23 1439  Mobility  Activity Ambulated independently in hallway  Level of Assistance Modified independent, requires aide device or extra time  Assistive Device None  Distance Ambulated (ft) 350 ft  Activity Response Tolerated well  Mobility Referral Yes  Mobility visit 1 Mobility  Mobility Specialist Start Time (ACUTE ONLY) 1415  Mobility Specialist Stop Time (ACUTE ONLY) 1429  Mobility Specialist Time Calculation (min) (ACUTE ONLY) 14 min    Pt was agreeable to mobility session - received on EOB. ModI throughout. SpO2 WFL on 2LO2. Took 2x standing breaks d/t fatigue. Stated he felt slightly SOB but otherwise no complaints. Returned to room without fault. Left on EOB with all needs met, call bell in reach.   Maurene Capes Mobility Specialist Please contact via SecureChat or Rehab office at [phone]

## 2023-08-12 NOTE — Progress Notes (Signed)
 PROGRESS NOTE                                                                                                                                                                                                             Patient Demographics:    Luis [name], is a 63 y.o. male, DOB - 01-12-1961, YNW:[id number]  Outpatient Primary MD for the patient is [name], NP    LOS - 6  Admit date - 08/06/2023    Chief Complaint  Patient presents with   Shortness of Breath       Brief Narrative (HPI from H&P)   63 year old M with PMH of COPD, diastolic CHF, CHB/PPM, DM-2, ileostomy, chronic abdominal wound and tobacco use disorder presenting with progressive shortness of breath for about a week, productive cough for 2 to 3 days, fever, wheezing and orthopnea, and admitted with working diagnosis of acute hypoxic respiratory failure in the setting of community-acquired pneumonia and COPD exacerbation.     Subjective:   Patient in bed, appears comfortable, denies any headache, no fever, no chest pain or pressure, improving shortness of breath , no abdominal pain. No new focal weakness.    Assessment  & Plan :   Acute respiratory failure with hypoxia due to pneumonia community-acquired versus aspiration, COPD exacerbation and forcible acute on chronic diastolic CHF EF 55% on echocardiogram few months ago. he is an alcoholic and still consumes large amount of alcohol and continuously smokes, high risk for aspiration, did have leukocytosis and elevated procalcitonin, we will place him on Unasyn along with azithromycin, continue steroids and diuretics as needed, encouraged to sit in chair use I-S and flutter valve for pulmonary toiletry.  Advance activity and titrate down oxygen.  Clinically has improved, strictly counseled to quit alcohol and smoking.  Speech therapy to evaluate.   SpO2: 98 % O2 Flow Rate (L/min): 4 L/min  COPD  exacerbation: As above, minimal wheezing start tapering steroids.   Acute on chronic diastolic CHF: TTE in 12/2022 with LVEF of 50 to 55%, G2-DD.  He is on torsemide and Farxiga at home.  Minimal crackles, minimal orthopnea, improving with diuresis continue diuretics IV on 08/12/2023.  AKI: Baseline Cr 0.8-0.9 although his creatinine is 1.23 in 02/2023.   Uncontrolled hypertension: On beta-blocker and Imdur, monitor ARB held due to hyperkalemia and renal dysfunction.  Complete heart block s/p PPM: EKG with V paced rhythm and flutter but rate controlled. Continue home Toprol-XL   Ileostomy/chronic abdominal wound: Goes to outpatient wound care. Consulted WOCN   Elevated troponin: Minimal elevation in non-ACS pattern, no chest pain, no further workup, likely demand mismatch from hypoxia.   Lactic acidosis: Likely due to AKI, respiratory failure and metformin.  Supportive care and monitor    Hyponatremia.  Hold further Lasix and monitor.    Hyperkalemia: Lokelma.    Alcohol abuse and tobacco use disorder: Been counseled to quit both, CIWA protocol and NicoDerm patch.  Seems to be slightly jittery on 08/10/2023 add low-dose Librium.  Seems to be in early DTs.   At risk for sleep apnea -Needs outpatient evaluation but has claustrophobia, current oxygen here.  Not on CPAP.   Obesity  Body mass index is 31.12 kg/m.  With PCP for weight loss.  NIDDM-2 with hyperglycemia: A1c 7.3%.  On metformin and Farxiga at home, CBGs elevated due to high-dose steroids, steroids being tapered down, insulin dose adjusted on 08/11/2023, monitor closely, CBGs can fall precipitously once steroids are withdrawn.   CBG (last 3)  Recent Labs    08/11/23 1106 08/11/23 1605 08/11/23 2140  GLUCAP 189* 182* 190*         Condition - Fair  Family Communication  :  None  Code Status :  Full  Consults  :  None  PUD Prophylaxis :    Procedures  :     CT Abd - Changes of fatty liver, hepatomegaly and  cirrhosis, stable. Small amount of perihepatic ascites. 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 No bowel obstruction. Aortoiliac atherosclerosis. No acute findings.   CT Chest - No evidence of pulmonary embolus. Cardiomegaly, coronary artery disease. Bronchial wall thickening compatible with bronchitis. This is similar to prior study. Continued bibasilar airspace opacities, atelectasis versus pneumonia. Opacities less pronounced than prior study. Aortic Atherosclerosis (ICD10-I70.0).      Disposition Plan  :    Status is: Inpatient  DVT Prophylaxis  :    SCDs Start: 08/06/23 2142  Lab Results  Component Value Date   PLT 212 08/11/2023    Diet :  Diet Order             Diet heart healthy/carb modified Room service appropriate? Yes; Fluid consistency: Thin; Fluid restriction: 1500 mL Fluid  Diet effective now                    Inpatient Medications  Scheduled Meds:  amLODipine  10 mg Oral Daily   atorvastatin  10 mg Oral Daily   chlordiazePOXIDE  10 mg Oral TID   dapagliflozin propanediol  10 mg Oral QAC breakfast   docusate sodium  200 mg Oral BID   folic acid  1 mg Oral Daily   furosemide  60 mg Intravenous Q12H   gabapentin  300 mg Oral BID   guaiFENesin  600 mg Oral BID   insulin aspart  0-20 Units Subcutaneous TID WC   insulin aspart  0-5 Units Subcutaneous QHS   insulin aspart  6 Units Subcutaneous TID WC   insulin glargine  20 Units Subcutaneous Daily   ipratropium-albuterol  3 mL Nebulization BID   isosorbide mononitrate  30 mg Oral Daily   loratadine  10 mg Oral Daily   methylPREDNISolone (SOLU-MEDROL) injection  30 mg Intravenous Daily   metoprolol succinate  25 mg Oral Daily   multivitamin with minerals  1 tablet Oral Daily   mupirocin ointment   Nasal BID   nicotine  21 mg Transdermal Daily   polyethylene glycol  17 g Oral BID   thiamine  100 mg Oral Daily   Continuous Infusions:  ampicillin-sulbactam  (UNASYN) IV 3 g (08/12/23 0512)   PRN Meds:.acetaminophen **OR** acetaminophen, albuterol, antiseptic oral rinse, benzonatate, hydrOXYzine, melatonin, ondansetron (ZOFRAN) IV, phenol, sodium chloride    Objective:   Vitals:   08/11/23 1829 08/11/23 1956 08/11/23 2000 08/12/23 0515  BP: (!) 163/88  (!) 144/84   Pulse: 61 62 61   Resp: 16  20   Temp: 97.8 F (36.6 C)     TempSrc: Oral     SpO2: 96% 96% 98%   Weight:    104.2 kg  Height:        Wt Readings from Last 3 Encounters:  08/12/23 104.2 kg  07/17/23 107 kg  06/15/23 106.1 kg     Intake/Output Summary (Last 24 hours) at 08/12/2023 0754 Last data filed at 08/12/2023 0514 Gross per 24 hour  Intake 418 ml  Output 3575 ml  Net -3157 ml     Physical Exam  Awake Alert, No new F.N deficits, Normal affect [place].AT,PERRAL Supple Neck, No JVD,   Symmetrical Chest wall movement, Mod air movement bilaterally, minimal wheezing but bibasilar crackles RRR,No Gallops,Rubs or new Murmurs,  +ve B.Sounds, Abd Soft, with chronic midline abdominal incision under bandage with colostomy bag No Cyanosis, Clubbing or edema       Data Review:    Recent Labs  Lab 08/07/23 0319 08/08/23 0427 08/09/23 0446 08/10/23 0514 08/11/23 0528  WBC 10.1 13.4* 13.8* 11.5* 10.6*  HGB 12.6* 12.3* 12.5* 12.8* 13.8  HCT 37.8* 37.2* 38.4* 38.6* 40.8  PLT 190 198 201 216 212  MCV 102.2* 103.3* 103.8* 103.5* 101.2*  MCH 34.1* 34.2* 33.8 34.3* 34.2*  MCHC 33.3 33.1 32.6 33.2 33.8  RDW 14.8 15.1 14.9 14.9 14.5  LYMPHSABS 0.4*  --  0.6* 1.1 1.3  MONOABS 0.2  --  1.1* 1.2* 1.1*  EOSABS 0.0  --  0.0 0.0 0.0  BASOSABS 0.0  --  0.0 0.0 0.0    Recent Labs  Lab 08/06/23 1650 08/06/23 1650 08/06/23 1657 08/06/23 1930 08/06/23 2315 08/07/23 0319 08/07/23 1841 08/08/23 0425 08/08/23 0427 08/09/23 0446 08/10/23 0514 08/11/23 0528  NA 133*  --   --   --   --  129* 131*  --  130* 136 140 137  K 5.0  --   --   --   --  6.0* 6.0*  --  5.2* 4.8  4.3 4.2  CL 93*  --   --   --   --  96* 97*  --  100 102 102 97*  CO2 24  --   --   --   --  21* 23  --  23 22 28 29   ANIONGAP 16*  --   --   --   --  12 11  --  7 12 10 11   GLUCOSE 129*  --   --   --   --  255* 253*  --  324* 226* 138* 151*  BUN 25*  --   --   --   --  28* 38*  --  40* 38* 30* 36*  CREATININE 1.32*  --   --   --   --  1.39* 1.54*  --  1.31* 1.15 0.99 1.00  AST 32  --   --   --   --  31  --   --  32  --  77* 76*  ALT 29  --   --   --   --  31  --   --  30  --  96* 136*  ALKPHOS 118  --   --   --   --  110  --   --  111  --  116 123  BILITOT 0.9  --   --   --   --  1.0  --   --  0.7  --  1.1 1.1  ALBUMIN 3.9  --   --   --   --  3.7 3.7  --  3.7  --  4.0 3.9  CRP  --   --   --   --   --   --   --   --  3.8* 1.4* 0.6 1.6*  PROCALCITON  --   --   --   --  7.67  --   --   --  4.53 1.82 0.77 0.43  LATICACIDVEN  --   --  3.6* 3.2*  --  1.9  --   --   --   --   --   --   INR  --   --   --   --   --  1.1  --   --   --   --   --   --   HGBA1C  --   --   --   --  7.3*  --   --   --   --   --   --   --   BNP 204.4*  --   --   --   --   --   --  527.4*  --  611.2* 513.2* 357.6*  MG  --    < >  --   --  2.1 2.2  --   --  2.6* 2.7* 2.5* 2.4  PHOS  --   --   --   --   --  4.4 2.7  --  3.9  --   --   --   CALCIUM 9.5  --   --   --   --  9.0 8.9  --  8.2* 9.0 9.1 9.4   < > = values in this interval not displayed.      Recent Labs  Lab 08/06/23 1650 08/06/23 1650 08/06/23 1657 08/06/23 1930 08/06/23 2315 08/07/23 0319 08/07/23 1841 08/08/23 0425 08/08/23 0427 08/09/23 0446 08/10/23 0514 08/11/23 0528  CRP  --   --   --   --   --   --   --   --  3.8* 1.4* 0.6 1.6*  PROCALCITON  --   --   --   --  7.67  --   --   --  4.53 1.82 0.77 0.43  LATICACIDVEN  --   --  3.6* 3.2*  --  1.9  --   --   --   --   --   --   INR  --   --   --   --   --  1.1  --   --   --   --   --   --   HGBA1C  --   --   --   --  7.3*  --   --   --   --   --   --   --  BNP 204.4*  --   --   --   --   --    --  527.4*  --  611.2* 513.2* 357.6*  MG  --    < >  --   --  2.1 2.2  --   --  2.6* 2.7* 2.5* 2.4  CALCIUM 9.5  --   --   --   --  9.0 8.9  --  8.2* 9.0 9.1 9.4   < > = values in this interval not displayed.    --------------------------------------------------------------------------------------------------------------- Lab Results  Component Value Date   CHOL 127 11/04/2022   HDL 32.50 (L) 11/04/2022   LDLCALC 53 12/26/2020   LDLDIRECT 51.0 11/04/2022   TRIG 522 (H) 12/26/2022   CHOLHDL 4 11/04/2022    Lab Results  Component Value Date   HGBA1C 7.3 (H) 08/06/2023     Micro Results Recent Results (from the past 240 hours)  Resp panel by RT-PCR (RSV, Flu A&B, Covid) Anterior Nasal Swab     Status: None   Collection Time: 08/06/23  4:07 PM   Specimen: Anterior Nasal Swab  Result Value Ref Range Status   SARS Coronavirus 2 by RT PCR NEGATIVE NEGATIVE Final   Influenza A by PCR NEGATIVE NEGATIVE Final   Influenza B by PCR NEGATIVE NEGATIVE Final    Comment: (NOTE) The Xpert Xpress SARS-CoV-2/FLU/RSV plus assay is intended as an aid in the diagnosis of influenza from Nasopharyngeal swab specimens and should not be used as a sole basis for treatment. Nasal washings and aspirates are unacceptable for Xpert Xpress SARS-CoV-2/FLU/RSV testing.  Fact Sheet for Patients: BloggerCourse.com  Fact Sheet for Healthcare Providers: SeriousBroker.it  This test is not yet approved or cleared by the [place] FDA and has been authorized for detection and/or diagnosis of SARS-CoV-2 by FDA under an Emergency Use Authorization (EUA). This EUA will remain in effect (meaning this test can be used) for the duration of the COVID-19 declaration under Section 564(b)(1) of the Act, 21 U.S.C. section 360bbb-3(b)(1), unless the authorization is terminated or revoked.     Resp Syncytial Virus by PCR NEGATIVE NEGATIVE Final    Comment:  (NOTE) Fact Sheet for Patients: BloggerCourse.com  Fact Sheet for Healthcare Providers: SeriousBroker.it  This test is not yet approved or cleared by the [place] FDA and has been authorized for detection and/or diagnosis of SARS-CoV-2 by FDA under an Emergency Use Authorization (EUA). This EUA will remain in effect (meaning this test can be used) for the duration of the COVID-19 declaration under Section 564(b)(1) of the Act, 21 U.S.C. section 360bbb-3(b)(1), unless the authorization is terminated or revoked.  Performed at [facility], [address], [place], [place] [address]   Blood culture (routine x 2)     Status: None   Collection Time: 08/06/23  5:00 PM   Specimen: BLOOD  Result Value Ref Range Status   Specimen Description BLOOD SITE NOT SPECIFIED  Final   Special Requests   Final    BOTTLES DRAWN AEROBIC AND ANAEROBIC Blood Culture adequate volume   Culture   Final    NO GROWTH 5 DAYS Performed at [facility], [address], [place], [place] [address]    Report Status 08/11/2023 FINAL  Final  Blood culture (routine x 2)     Status: None   Collection Time: 08/06/23  5:20 PM   Specimen: BLOOD  Result Value Ref Range Status   Specimen Description BLOOD SITE NOT SPECIFIED  Final   Special Requests   Final    BOTTLES DRAWN AEROBIC AND ANAEROBIC Blood Culture adequate volume   Culture   Final    NO GROWTH 5 DAYS Performed at [facility], [address] [place], [place] [address]    Report Status 08/11/2023 FINAL  Final  MRSA Next Gen by PCR, Nasal     Status: Abnormal   Collection Time: 08/08/23  5:48 AM   Specimen: Nasal Mucosa; Nasal Swab  Result Value Ref Range Status   MRSA by PCR Next Gen DETECTED (A) NOT DETECTED Final    Comment: RESULT CALLED TO, READ BACK BY AND VERIFIED WITH: Dorothea Glassman 2/25 0834 MSG (NOTE) The GeneXpert MRSA Assay (FDA approved for NASAL specimens only), is  one component of a comprehensive MRSA colonization surveillance program. It is not intended to diagnose MRSA infection nor to guide or monitor treatment for MRSA infections. Test performance is not FDA approved in patients less than 56 years old. Performed at [facility], [address] [place], [place] [address]     Radiology Report No results found.    Signature  -   Susa Raring M.D on 08/12/2023 at 7:54 AM   -  To page go to [email or web address]

## 2023-08-13 DIAGNOSIS — J189 Pneumonia, unspecified organism: Secondary | ICD-10-CM | POA: Diagnosis not present

## 2023-08-13 DIAGNOSIS — J9601 Acute respiratory failure with hypoxia: Secondary | ICD-10-CM | POA: Diagnosis not present

## 2023-08-13 LAB — BASIC METABOLIC PANEL
Anion gap: 14 (ref 5–15)
BUN: 49 mg/dL — ABNORMAL HIGH (ref 8–23)
CO2: 28 mmol/L (ref 22–32)
Calcium: 9.9 mg/dL (ref 8.9–10.3)
Chloride: 95 mmol/L — ABNORMAL LOW (ref 98–111)
Creatinine, Ser: 1.05 mg/dL (ref 0.61–1.24)
GFR, Estimated: >60 mL/min (ref >60)
Glucose, Bld: 128 mg/dL — ABNORMAL HIGH (ref 70–99)
Potassium: 3.4 mmol/L — ABNORMAL LOW (ref 3.5–5.1)
Sodium: 137 mmol/L (ref 135–145)

## 2023-08-13 LAB — GLUCOSE, CAPILLARY
Glucose-Capillary: 229 mg/dL — ABNORMAL HIGH (ref 70–99)
Glucose-Capillary: 121 mg/dL — ABNORMAL HIGH (ref 70–99)
Glucose-Capillary: 310 mg/dL — ABNORMAL HIGH (ref 70–99)
Glucose-Capillary: 199 mg/dL — ABNORMAL HIGH (ref 70–99)

## 2023-08-13 LAB — MAGNESIUM: Magnesium: 2.3 mg/dL (ref 1.7–2.4)

## 2023-08-13 MED ORDER — FUROSEMIDE 10 MG/ML IJ SOLN
80.0000 mg | Freq: Every day | INTRAMUSCULAR | Status: DC
  Administered 2023-08-13 – 2023-08-14 (×2): 80 mg via INTRAVENOUS
  Filled 2023-08-13 (×2): qty 8

## 2023-08-13 MED ORDER — POTASSIUM CHLORIDE CRYS ER 20 MEQ PO TBCR
40.0000 meq | EXTENDED_RELEASE_TABLET | Freq: Two times a day (BID) | ORAL | Status: AC
  Administered 2023-08-13 (×2): 40 meq via ORAL
  Filled 2023-08-13 (×2): qty 2

## 2023-08-13 MED ORDER — HYDRALAZINE HCL 50 MG PO TABS
50.0000 mg | ORAL_TABLET | Freq: Three times a day (TID) | ORAL | Status: DC
  Administered 2023-08-13 – 2023-08-14 (×4): 50 mg via ORAL
  Filled 2023-08-13 (×4): qty 1

## 2023-08-13 MED ORDER — METOLAZONE 2.5 MG PO TABS: 2.5000 mg | ORAL_TABLET | Freq: Once | ORAL | Status: DC

## 2023-08-13 MED ORDER — METHYLPREDNISOLONE SODIUM SUCC 40 MG IJ SOLR
20.0000 mg | Freq: Every day | INTRAMUSCULAR | Status: AC
  Administered 2023-08-14: 20 mg via INTRAVENOUS
  Filled 2023-08-13: qty 1

## 2023-08-13 NOTE — Progress Notes (Signed)
 PROGRESS NOTE                                                                                                                                                                                                             Patient Demographics:    Luis [name], is a 63 y.o. male, DOB - 1960-09-18, ZOX:[id number]  Outpatient Primary MD for the patient is [name], NP    LOS - 7  Admit date - 08/06/2023    Chief Complaint  Patient presents with   Shortness of Breath       Brief Narrative (HPI from H&P)   63 year old M with PMH of COPD, diastolic CHF, CHB/PPM, DM-2, ileostomy, chronic abdominal wound and tobacco use disorder presenting with progressive shortness of breath for about a week, productive cough for 2 to 3 days, fever, wheezing and orthopnea, and admitted with working diagnosis of acute hypoxic respiratory failure in the setting of community-acquired pneumonia and COPD exacerbation.     Subjective:   Patient in bed, appears comfortable, denies any headache, no fever, no chest pain or pressure, much improved shortness of breath , no abdominal pain. No focal weakness.   Assessment  & Plan :   Acute respiratory failure with hypoxia due to pneumonia community-acquired versus aspiration, COPD exacerbation and forcible acute on chronic diastolic CHF EF 55% on echocardiogram few months ago. he is an alcoholic and still consumes large amount of alcohol and continuously smokes, high risk for aspiration, did have leukocytosis and elevated procalcitonin, we will place him on Unasyn along with azithromycin, continue steroids and diuretics as needed, encouraged to sit in chair use I-S and flutter valve for pulmonary toiletry.  Advance activity and titrate down oxygen.  Clinically has improved, strictly counseled to quit alcohol and smoking.  Speech therapy to evaluate.   SpO2: 94 % O2 Flow Rate (L/min): (S) 2 L/min  COPD  exacerbation: As above, minimal wheezing start tapering steroids.   Acute on chronic diastolic CHF: TTE in 12/2022 with LVEF of 50 to 55%, G2-DD.  He is on torsemide and Farxiga at home.  Minimal crackles, minimal orthopnea, improving with diuresis continue diuretics IV on 08/13/2023.  AKI: Baseline Cr 0.8-0.9 although his creatinine is 1.23 in 02/2023.   Uncontrolled hypertension: On beta-blocker and Imdur, monitor ARB held due to hyperkalemia and renal dysfunction.  Complete heart block s/p PPM: EKG with V paced rhythm and flutter but rate controlled. Continue home Toprol-XL   Ileostomy/chronic abdominal wound: Goes to outpatient wound care. Consulted WOCN   Elevated troponin: Minimal elevation in non-ACS pattern, no chest pain, no further workup, likely demand mismatch from hypoxia.   Lactic acidosis: Likely due to AKI, respiratory failure and metformin.  Supportive care and monitor    Hyponatremia.  Hold further Lasix and monitor.    Hypokalemia: Replaced   Alcohol abuse and tobacco use disorder: Been counseled to quit both, CIWA protocol and NicoDerm patch.  Seems to be slightly jittery on 08/10/2023 add low-dose Librium.  Seems to be in early DTs.   At risk for sleep apnea -Needs outpatient evaluation but has claustrophobia, current oxygen here.  Not on CPAP.   Obesity  Body mass index is 31.12 kg/m.  With PCP for weight loss.  NIDDM-2 with hyperglycemia: A1c 7.3%.  On metformin and Farxiga at home, CBGs elevated due to high-dose steroids, steroids being tapered down, insulin dose adjusted on 08/11/2023, monitor closely, CBGs can fall precipitously once steroids are withdrawn.   CBG (last 3)  Recent Labs    08/12/23 1624 08/12/23 2152 08/13/23 0816  GLUCAP 245* 160* 229*         Condition - Fair  Family Communication  :  None  Code Status :  Full  Consults  :  None  PUD Prophylaxis :    Procedures  :     CT Abd - Changes of fatty liver, hepatomegaly and  cirrhosis, stable. Small amount of perihepatic ascites. 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 No bowel obstruction. Aortoiliac atherosclerosis. No acute findings.   CT Chest - No evidence of pulmonary embolus. Cardiomegaly, coronary artery disease. Bronchial wall thickening compatible with bronchitis. This is similar to prior study. Continued bibasilar airspace opacities, atelectasis versus pneumonia. Opacities less pronounced than prior study. Aortic Atherosclerosis (ICD10-I70.0).      Disposition Plan  :    Status is: Inpatient  DVT Prophylaxis  :    SCDs Start: 08/06/23 2142  Lab Results  Component Value Date   PLT 212 08/11/2023    Diet :  Diet Order             Diet heart healthy/carb modified Room service appropriate? Yes; Fluid consistency: Thin; Fluid restriction: 1500 mL Fluid  Diet effective now                    Inpatient Medications  Scheduled Meds:  amLODipine  10 mg Oral Daily   atorvastatin  10 mg Oral Daily   chlordiazePOXIDE  10 mg Oral TID   dapagliflozin propanediol  10 mg Oral QAC breakfast   docusate sodium  200 mg Oral BID   folic acid  1 mg Oral Daily   furosemide  80 mg Intravenous Daily   gabapentin  300 mg Oral BID   guaiFENesin  600 mg Oral BID   hydrALAZINE  50 mg Oral Q8H   insulin aspart  0-20 Units Subcutaneous TID WC   insulin aspart  0-5 Units Subcutaneous QHS   insulin aspart  6 Units Subcutaneous TID WC   insulin glargine  20 Units Subcutaneous Daily   ipratropium-albuterol  3 mL Nebulization BID   isosorbide mononitrate  30 mg Oral Daily   loratadine  10 mg Oral Daily   [START ON 08/14/2023] methylPREDNISolone (SOLU-MEDROL) injection  20 mg Intravenous Daily   metoprolol succinate  25 mg Oral Daily   multivitamin with minerals  1 tablet Oral Daily   mupirocin ointment   Nasal BID   nicotine  21 mg Transdermal Daily   polyethylene glycol  17 g Oral BID   potassium chloride  40 mEq Oral  BID   thiamine  100 mg Oral Daily   Continuous Infusions:  ampicillin-sulbactam (UNASYN) IV 3 g (08/13/23 0613)   PRN Meds:.acetaminophen **OR** acetaminophen, albuterol, antiseptic oral rinse, benzonatate, hydrOXYzine, melatonin, ondansetron (ZOFRAN) IV, phenol, sodium chloride    Objective:   Vitals:   08/13/23 0500 08/13/23 0619 08/13/23 0723 08/13/23 0829  BP:  (!) 170/89    Pulse:   60   Resp:      Temp:      TempSrc:      SpO2:   93% 94%  Weight: 102.6 kg     Height:        Wt Readings from Last 3 Encounters:  08/13/23 102.6 kg  07/17/23 107 kg  06/15/23 106.1 kg     Intake/Output Summary (Last 24 hours) at 08/13/2023 1610 Last data filed at 08/13/2023 9604 Gross per 24 hour  Intake --  Output 4000 ml  Net -4000 ml     Physical Exam  Awake Alert, No new F.N deficits, Normal affect [place].AT,PERRAL Supple Neck, No JVD,   Symmetrical Chest wall movement, Mod air movement bilaterally, minimal wheezing but bibasilar crackles RRR,No Gallops,Rubs or new Murmurs,  +ve B.Sounds, Abd Soft, with chronic midline abdominal incision under bandage with colostomy bag No Cyanosis, Clubbing or edema       Data Review:    Recent Labs  Lab 08/07/23 0319 08/08/23 0427 08/09/23 0446 08/10/23 0514 08/11/23 0528  WBC 10.1 13.4* 13.8* 11.5* 10.6*  HGB 12.6* 12.3* 12.5* 12.8* 13.8  HCT 37.8* 37.2* 38.4* 38.6* 40.8  PLT 190 198 201 216 212  MCV 102.2* 103.3* 103.8* 103.5* 101.2*  MCH 34.1* 34.2* 33.8 34.3* 34.2*  MCHC 33.3 33.1 32.6 33.2 33.8  RDW 14.8 15.1 14.9 14.9 14.5  LYMPHSABS 0.4*  --  0.6* 1.1 1.3  MONOABS 0.2  --  1.1* 1.2* 1.1*  EOSABS 0.0  --  0.0 0.0 0.0  BASOSABS 0.0  --  0.0 0.0 0.0    Recent Labs  Lab 08/06/23 1650 08/06/23 1650 08/06/23 1657 08/06/23 1930 08/06/23 2315 08/07/23 0319 08/07/23 1841 08/08/23 0425 08/08/23 0427 08/09/23 0446 08/10/23 0514 08/11/23 0528 08/13/23 0532  NA 133*  --   --   --   --  129* 131*  --  130* 136 140 137  137  K 5.0  --   --   --   --  6.0* 6.0*  --  5.2* 4.8 4.3 4.2 3.4*  CL 93*  --   --   --   --  96* 97*  --  100 102 102 97* 95*  CO2 24  --   --   --   --  21* 23  --  23 22 28 29 28   ANIONGAP 16*  --   --   --   --  12 11  --  7 12 10 11 14   GLUCOSE 129*  --   --   --   --  255* 253*  --  324* 226* 138* 151* 128*  BUN 25*  --   --   --   --  28* 38*  --  40* 38* 30* 36* 49*  CREATININE 1.32*  --   --   --   --  1.39* 1.54*  --  1.31* 1.15 0.99 1.00 1.05  AST 32  --   --   --   --  31  --   --  32  --  77* 76*  --   ALT 29  --   --   --   --  31  --   --  30  --  96* 136*  --   ALKPHOS 118  --   --   --   --  110  --   --  111  --  116 123  --   BILITOT 0.9  --   --   --   --  1.0  --   --  0.7  --  1.1 1.1  --   ALBUMIN 3.9  --   --   --   --  3.7 3.7  --  3.7  --  4.0 3.9  --   CRP  --   --   --   --   --   --   --   --  3.8* 1.4* 0.6 1.6*  --   PROCALCITON  --   --   --   --  7.67  --   --   --  4.53 1.82 0.77 0.43  --   LATICACIDVEN  --   --  3.6* 3.2*  --  1.9  --   --   --   --   --   --   --   INR  --   --   --   --   --  1.1  --   --   --   --   --   --   --   HGBA1C  --   --   --   --  7.3*  --   --   --   --   --   --   --   --   BNP 204.4*  --   --   --   --   --   --  527.4*  --  611.2* 513.2* 357.6*  --   MG  --    < >  --   --  2.1 2.2  --   --  2.6* 2.7* 2.5* 2.4 2.3  PHOS  --   --   --   --   --  4.4 2.7  --  3.9  --   --   --   --   CALCIUM 9.5  --   --   --   --  9.0 8.9  --  8.2* 9.0 9.1 9.4 9.9   < > = values in this interval not displayed.      Recent Labs  Lab 08/06/23 1650 08/06/23 1650 08/06/23 1657 08/06/23 1930 08/06/23 2315 08/07/23 0319 08/07/23 1841 08/08/23 0425 08/08/23 0427 08/09/23 0446 08/10/23 0514 08/11/23 0528 08/13/23 0532  CRP  --   --   --   --   --   --   --   --  3.8* 1.4* 0.6 1.6*  --   PROCALCITON  --   --   --   --  7.67  --   --   --  4.53 1.82 0.77 0.43  --   LATICACIDVEN  --   --  3.6* 3.2*  --  1.9  --   --   --   --   --    --   --  INR  --   --   --   --   --  1.1  --   --   --   --   --   --   --   HGBA1C  --   --   --   --  7.3*  --   --   --   --   --   --   --   --   BNP 204.4*  --   --   --   --   --   --  527.4*  --  611.2* 513.2* 357.6*  --   MG  --    < >  --   --  2.1 2.2  --   --  2.6* 2.7* 2.5* 2.4 2.3  CALCIUM 9.5  --   --   --   --  9.0   < >  --  8.2* 9.0 9.1 9.4 9.9   < > = values in this interval not displayed.    --------------------------------------------------------------------------------------------------------------- Lab Results  Component Value Date   CHOL 127 11/04/2022   HDL 32.50 (L) 11/04/2022   LDLCALC 53 12/26/2020   LDLDIRECT 51.0 11/04/2022   TRIG 522 (H) 12/26/2022   CHOLHDL 4 11/04/2022    Lab Results  Component Value Date   HGBA1C 7.3 (H) 08/06/2023     Micro Results Recent Results (from the past 240 hours)  Resp panel by RT-PCR (RSV, Flu A&B, Covid) Anterior Nasal Swab     Status: None   Collection Time: 08/06/23  4:07 PM   Specimen: Anterior Nasal Swab  Result Value Ref Range Status   SARS Coronavirus 2 by RT PCR NEGATIVE NEGATIVE Final   Influenza A by PCR NEGATIVE NEGATIVE Final   Influenza B by PCR NEGATIVE NEGATIVE Final    Comment: (NOTE) The Xpert Xpress SARS-CoV-2/FLU/RSV plus assay is intended as an aid in the diagnosis of influenza from Nasopharyngeal swab specimens and should not be used as a sole basis for treatment. Nasal washings and aspirates are unacceptable for Xpert Xpress SARS-CoV-2/FLU/RSV testing.  Fact Sheet for Patients: BloggerCourse.com  Fact Sheet for Healthcare Providers: SeriousBroker.it  This test is not yet approved or cleared by the [place] FDA and has been authorized for detection and/or diagnosis of SARS-CoV-2 by FDA under an Emergency Use Authorization (EUA). This EUA will remain in effect (meaning this test can be used) for the duration of the COVID-19  declaration under Section 564(b)(1) of the Act, 21 U.S.C. section 360bbb-3(b)(1), unless the authorization is terminated or revoked.     Resp Syncytial Virus by PCR NEGATIVE NEGATIVE Final    Comment: (NOTE) Fact Sheet for Patients: BloggerCourse.com  Fact Sheet for Healthcare Providers: SeriousBroker.it  This test is not yet approved or cleared by the [place] FDA and has been authorized for detection and/or diagnosis of SARS-CoV-2 by FDA under an Emergency Use Authorization (EUA). This EUA will remain in effect (meaning this test can be used) for the duration of the COVID-19 declaration under Section 564(b)(1) of the Act, 21 U.S.C. section 360bbb-3(b)(1), unless the authorization is terminated or revoked.  Performed at [facility], [address], [place], [place] [address]   Blood culture (routine x 2)     Status: None   Collection Time: 08/06/23  5:00 PM   Specimen: BLOOD  Result Value Ref Range Status   Specimen Description BLOOD SITE NOT SPECIFIED  Final   Special Requests   Final  BOTTLES DRAWN AEROBIC AND ANAEROBIC Blood Culture adequate volume   Culture   Final    NO GROWTH 5 DAYS Performed at [facility], [address], [place], [place] [address]    Report Status 08/11/2023 FINAL  Final  Blood culture (routine x 2)     Status: None   Collection Time: 08/06/23  5:20 PM   Specimen: BLOOD  Result Value Ref Range Status   Specimen Description BLOOD SITE NOT SPECIFIED  Final   Special Requests   Final    BOTTLES DRAWN AEROBIC AND ANAEROBIC Blood Culture adequate volume   Culture   Final    NO GROWTH 5 DAYS Performed at [facility], [address], [place], [place] [address]    Report Status 08/11/2023 FINAL  Final  MRSA Next Gen by PCR, Nasal     Status: Abnormal   Collection Time: 08/08/23  5:48 AM   Specimen: Nasal Mucosa; Nasal Swab  Result Value Ref Range Status   MRSA by PCR  Next Gen DETECTED (A) NOT DETECTED Final    Comment: RESULT CALLED TO, READ BACK BY AND VERIFIED WITH: Dorothea Glassman 2/25 0834 MSG (NOTE) The GeneXpert MRSA Assay (FDA approved for NASAL specimens only), is one component of a comprehensive MRSA colonization surveillance program. It is not intended to diagnose MRSA infection nor to guide or monitor treatment for MRSA infections. Test performance is not FDA approved in patients less than 26 years old. Performed at [facility], [address], [place], [place] [address]     Radiology Report DG Chest Port 1 View Result Date: 08/12/2023 CLINICAL DATA:  Shortness of breath. EXAM: PORTABLE CHEST 1 VIEW COMPARISON:  08/10/2023 FINDINGS: Cardiac enlargement. Aortic atherosclerosis. Pulmonary vascular congestion. No pleural effusion, airspace consolidation, atelectasis or pneumothorax. Visualized osseous structures are unremarkable. IMPRESSION: Cardiac enlargement and pulmonary vascular congestion. Electronically Signed   By: Signa Kell M.D.   On: 08/12/2023 08:29      Signature  -   Susa Raring M.D on 08/13/2023 at 9:22 AM   -  To page go to [email or web address]

## 2023-08-13 NOTE — Plan of Care (Signed)
   Problem: Education: Goal: Ability to describe self-care measures that may prevent or decrease complications (Diabetes Survival Skills Education) will improve Outcome: Progressing   Problem: Fluid Volume: Goal: Ability to maintain a balanced intake and output will improve Outcome: Progressing   Problem: Metabolic: Goal: Ability to maintain appropriate glucose levels will improve Outcome: Progressing   Problem: Nutritional: Goal: Maintenance of adequate nutrition will improve Outcome: Progressing   Problem: Skin Integrity: Goal: Risk for impaired skin integrity will decrease Outcome: Progressing

## 2023-08-13 NOTE — Plan of Care (Signed)
 Patient is resting. Patient denies any pain during shift. Patient eager to go home.

## 2023-08-14 ENCOUNTER — Other Ambulatory Visit: Payer: Self-pay | Admitting: *Deleted

## 2023-08-14 ENCOUNTER — Other Ambulatory Visit (HOSPITAL_COMMUNITY): Payer: Self-pay

## 2023-08-14 ENCOUNTER — Encounter: Payer: Self-pay | Admitting: Pulmonary Disease

## 2023-08-14 DIAGNOSIS — J189 Pneumonia, unspecified organism: Secondary | ICD-10-CM | POA: Diagnosis not present

## 2023-08-14 LAB — POTASSIUM: Potassium: 4.1 mmol/L (ref 3.5–5.1)

## 2023-08-14 LAB — GLUCOSE, CAPILLARY
Glucose-Capillary: 216 mg/dL — ABNORMAL HIGH (ref 70–99)
Glucose-Capillary: 134 mg/dL — ABNORMAL HIGH (ref 70–99)

## 2023-08-14 MED ORDER — NICOTINE 21 MG/24HR TD PT24
21.0000 mg | MEDICATED_PATCH | Freq: Every day | TRANSDERMAL | 0 refills | Status: DC
  Filled 2023-08-14: qty 28, 28d supply, fill #0

## 2023-08-14 MED ORDER — ISOSORBIDE MONONITRATE ER 30 MG PO TB24
30.0000 mg | ORAL_TABLET | Freq: Every day | ORAL | 0 refills | Status: DC
  Filled 2023-08-14: qty 30, 30d supply, fill #0

## 2023-08-14 NOTE — Discharge Instructions (Signed)
 Follow with Primary MD [name], NP in 7 days   Get CBC, CMP, Magnesium, 2 view Chest X ray -  checked next visit with your primary MD    Activity: As tolerated with Full fall precautions use walker/cane & assistance as needed  Disposition Home   Diet: Heart Healthy low carbohydrate, 1.5 L total fluid restriction per day.  Check CBGs q. ACH S.  Check your Weight same time everyday, if you gain over 2 pounds, or you develop in leg swelling, experience more shortness of breath or chest pain, call your Primary MD immediately. Follow Cardiac Low Salt Diet and 1.5 lit/day fluid restriction.  Special Instructions: If you have smoked or chewed Tobacco  in the last 2 yrs please stop smoking, stop any regular Alcohol  and or any Recreational drug use.  On your next visit with your primary care physician please Get Medicines reviewed and adjusted.  Please request your Prim.MD to go over all Hospital Tests and Procedure/Radiological results at the follow up, please get all Hospital records sent to your Prim MD by signing hospital release before you go home.  If you experience worsening of your admission symptoms, develop shortness of breath, life threatening emergency, suicidal or homicidal thoughts you must seek medical attention immediately by calling 911 or calling your MD immediately  if symptoms less severe.  You Must read complete instructions/literature along with all the possible adverse reactions/side effects for all the Medicines you take and that have been prescribed to you. Take any new Medicines after you have completely understood and accpet all the possible adverse reactions/side effects.   Do not drive when taking Pain medications.  Do not take more than prescribed Pain, Sleep and Anxiety Medications

## 2023-08-14 NOTE — Progress Notes (Signed)
 OT Cancellation Note  Patient Details Name: Luis [name] MRN: [id number] DOB: 03-30-1961   Cancelled Treatment:    Reason Eval/Treat Not Completed: Other (comment) (pt completing his own ostomy care) pt completing ostomy care, will f/u as time allows for OT session.   Lenor Derrick., COTA/L Acute Rehabilitation Services [phone]   Barron Schmid 08/14/2023, 10:38 AM

## 2023-08-14 NOTE — Patient Instructions (Signed)
 Visit Information  Mr. Isac Lincks  - as a part of your Medicaid benefit, you are eligible for care management and care coordination services at no cost or copay. I was unable to reach you by phone today but would be happy to help you with your health related needs. Please feel free to call me @ [phone].   A member of the Managed Medicaid care management team will reach out to you again over the next 7 days.   [name] RN, BSN [place]  [facility] RN Care Manager [phone]

## 2023-08-14 NOTE — TOC Transition Note (Signed)
 Transition of Care [facility]) - Discharge Note   Patient Details  Name: Linsey Hirota MRN: [id number] Date of Birth: [birth year]  Transition of Care [facility] CM/SW Contact:  Kermit Balo, RN Phone Number: 08/14/2023, 1:19 PM   Clinical Narrative:     Pt is discharging home with self care. No follow up per PT/OT. Pt did not qualify for home oxygen with an ambulatory sat of 91%.  Pt has colostomy and states he is self care and has all needed equipment for this at home.  Pt asking for aides at home. CM has filled out and sent in request for PCS services through his [facility]. [facility] will contact him for an assessment and inquire on the care giving agency he wants to use.  Pt states he hasn't gotten his disability check this month. CM provided him with number to disability.  Pt has transportation home.   Final next level of care: Home/Self Care Barriers to Discharge: No Barriers Identified   Patient Goals and CMS Choice            Discharge Placement                       Discharge Plan and Services Additional resources added to the After Visit Summary for                                       Social Drivers of Health (SDOH) Interventions SDOH Screenings   Food Insecurity: No Food Insecurity (08/07/2023)  Housing: Low Risk  (08/07/2023)  Transportation Needs: Unmet Transportation Needs (08/07/2023)  Utilities: Not At Risk (08/07/2023)  Depression (PHQ2-9): Low Risk  (06/15/2023)  Recent Concern: Depression (PHQ2-9) - High Risk (04/18/2023)  Stress: Stress Concern Present (04/18/2023)  Tobacco Use: High Risk (08/06/2023)     Readmission Risk Interventions     No data to display

## 2023-08-14 NOTE — Plan of Care (Signed)

## 2023-08-14 NOTE — Patient Outreach (Signed)
  Medicaid Managed Care   Unsuccessful Attempt Note   08/14/2023 Name: Luis [name] MRN: [id number] DOB: July 22, 1960  Referred by: [name], NP Reason for referral : High Risk Managed Medicaid (Unsuccessful RNCM follow up telephone outreach)   An unsuccessful telephone outreach was attempted today. The patient was referred to the case management team for assistance with care management and care coordination.    Follow Up Plan: The Managed Medicaid care management team will reach out to the patient again over the next 7 days.    [name] RN, BSN [place]  [facility] RN Care Manager [phone]

## 2023-08-14 NOTE — Plan of Care (Signed)

## 2023-08-14 NOTE — Progress Notes (Signed)
 Physical Therapy Treatment Patient Details Name: Luis [name] MRN: [id number] DOB: [birth year] Today's Date: 08/14/2023   History of Present Illness 63 year old M presenting with progressive shortness of breath for about a week, productive cough for 2 to 3 days, fever, wheezing and orthopnea, and admitted with working diagnosis of acute hypoxic respiratory failure in the setting of community-acquired pneumonia and COPD exacerbation. PMH of COPD, diastolic CHF, CHB/PPM, DM-2, ileostomy, chronic abdominal wound and tobacco use    PT Comments  Tolerated well, ambulating 500 feet without assistive device. SpO2 91% on room air, up to 96% on 2L supplemental O2. No overt LOB, minimal dyspnea. Educated on safety and symptom awareness. Mod I level. Will follow and progress until d/c.     If plan is discharge home, recommend the following: Assist for transportation;Help with stairs or ramp for entrance   Can travel by private vehicle        Equipment Recommendations  None recommended by PT    Recommendations for Other Services       Precautions / Restrictions Precautions Precautions: Fall Recall of Precautions/Restrictions: Intact Precaution/Restrictions Comments: watch O2 (does not wear at baseline), ostomy Restrictions Weight Bearing Restrictions Per Provider Order: No     Mobility  Bed Mobility Overal bed mobility: Modified Independent             General bed mobility comments: Seated EOB    Transfers Overall transfer level: Modified independent Equipment used: None Transfers: Sit to/from Stand Sit to Stand: Modified independent (Device/Increase time)           General transfer comment: stable assisted with lines/leads O2.    Ambulation/Gait Ambulation/Gait assistance: Modified independent (Device/Increase time) Gait Distance (Feet): 500 Feet Assistive device: None Gait Pattern/deviations: Decreased stride length, Step-through pattern Gait velocity:  decreased Gait velocity interpretation: 1.31 - 2.62 ft/sec, indicative of limited community ambulator   General Gait Details: Grossly stable without assistive device. Started on 2L supplemental O2, decreased to 1L, finally on RA. Sats maintained 96%, 93%, and 91% respectively. RPE of 2/10, minimal dyspnea. No overt LOB noted. Cues for safety, symptom awareness. Managed lines/leads for pt.   Stairs             Wheelchair Mobility     Tilt Bed    Modified Rankin (Stroke Patients Only)       Balance Overall balance assessment: No apparent balance deficits (not formally assessed) Sitting-balance support: No upper extremity supported, Feet supported Sitting balance-Leahy Scale: Good     Standing balance support: No upper extremity supported Standing balance-Leahy Scale: Good                              Communication Communication Communication: No apparent difficulties  Cognition Arousal: Alert Behavior During Therapy: WFL for tasks assessed/performed   PT - Cognitive impairments: No apparent impairments                         Following commands: Intact      Cueing Cueing Techniques: Verbal cues  Exercises      General Comments General comments (skin integrity, edema, etc.): SpO2 91% and greater during session on room air. Increases to 96% on 2L. Educated on symptom awareness, energy conservation techniques.      Pertinent Vitals/Pain Pain Assessment Pain Assessment: No/denies pain    Home Living  Prior Function            PT Goals (current goals can now be found in the care plan section) Acute Rehab PT Goals Patient Stated Goal: to go home PT Goal Formulation: With patient Time For Goal Achievement: 08/22/23 Potential to Achieve Goals: Good Progress towards PT goals: Progressing toward goals    Frequency    Min 1X/week      PT Plan      Co-evaluation              AM-PAC PT  "6 Clicks" Mobility   Outcome Measure  Help needed turning from your back to your side while in a flat bed without using bedrails?: None Help needed moving from lying on your back to sitting on the side of a flat bed without using bedrails?: None Help needed moving to and from a bed to a chair (including a wheelchair)?: None Help needed standing up from a chair using your arms (e.g., wheelchair or bedside chair)?: None Help needed to walk in hospital room?: None Help needed climbing 3-5 steps with a railing? : A Little 6 Click Score: 23    End of Session Equipment Utilized During Treatment: Gait belt Activity Tolerance: Patient tolerated treatment well Patient left: with call bell/phone within reach;in chair;with nursing/sitter in room Nurse Communication: Mobility status PT Visit Diagnosis: Other abnormalities of gait and mobility (R26.89)     Time: 1209-1228 PT Time Calculation (min) (ACUTE ONLY): 19 min  Charges:    $Therapeutic Activity: 8-22 mins PT General Charges $$ ACUTE PT VISIT: 1 Visit                     Kathlyn Sacramento, PT, DPT [facility]  Rehabilitation Services Physical Therapist Office: [phone] Website: [email or web address]    Berton Mount 08/14/2023, 1:07 PM

## 2023-08-14 NOTE — Discharge Summary (Signed)
 Luis [name] GNF:[id number] DOB: Aug 29, 1960 DOA: 08/06/2023  PCP: [name], NP  Admit date: 08/06/2023  Discharge date: 08/14/2023  Admitted From: Home   Disposition:  Home   Recommendations for Outpatient Follow-up:   Follow up with PCP in 1-2 weeks  PCP Please obtain BMP/CBC, 2 view CXR in 1week,  (see Discharge instructions)   PCP Please follow up on the following pending results:    Home Health: None   Equipment/Devices: o2  Consultations: None   Discharge Condition: Stable     CODE STATUS: Full     Diet Recommendation: Heart Healthy low carbohydrate, 1.5 L fluid restriction    Chief Complaint  Patient presents with   Shortness of Breath     Brief history of present illness from the day of admission and additional interim summary    63 year old M with PMH of COPD, diastolic CHF, CHB/PPM, DM-2, ileostomy, chronic abdominal wound and tobacco use disorder presenting with progressive shortness of breath for about a week, productive cough for 2 to 3 days, fever, wheezing and orthopnea, and admitted with working diagnosis of acute hypoxic respiratory failure in the setting of community-acquired pneumonia and COPD exacerbation.                                                                  Hospital Course   Acute respiratory failure with hypoxia due to pneumonia community-acquired versus aspiration, COPD exacerbation and forcible acute on chronic diastolic CHF EF 55% on echocardiogram few months ago. he is an alcoholic and still consumes large amount of alcohol and continuously smokes, high risk for aspiration, did have leukocytosis and elevated procalcitonin, he has finished his treatment for pneumonia, COPD exacerbation was mild he did have significant fluid overload kindly see below, now has been diuresed  and is completely symptom-free, at rest he is on room air upon ambulation still requires 2 L oxygen which she will get.  He is on significant large doses of diuretics at home which will be continued his problem with fluid restriction on which she has been counseled.  PCP to monitor closely.  He has finished his steroid course as well.  No wheezing on exam no crackles.    COPD exacerbation: Treated as above along with short course of steroids completely resolved.  For home oxygen upon ambulation which she will get, PCP to monitor in the outpatient setting.   Acute on chronic diastolic CHF: TTE in 12/2022 with LVEF of 50 to 55%, G2-DD.  Aggressively diuresed, over 20 L, back to baseline, does not practice salt and fluid restriction, given written instructions, drinks plenty of alcohol every day, again counseled about it.   AKI: Due to CHF resolved.   Uncontrolled hypertension: New home regimen of beta-blocker and Norvasc PCP to monitor and adjust.  Complete heart block s/p PPM: EKG with V paced rhythm and flutter but rate controlled. Continue home Toprol-XL   Ileostomy/chronic abdominal wound: Goes to outpatient wound care. Consulted WOCN   Elevated troponin: Minimal elevation in non-ACS pattern, no chest pain, no further workup, likely demand mismatch from hypoxia.   Lactic acidosis: Likely due to AKI, respiratory failure and metformin.  Route with supportive care PCP to monitor.  If develops again discontinue metformin permanently.    Hypokalemia: Replaced and stable   Alcohol abuse and tobacco use disorder: Underwent mild DTs was placed on Librium and CIWA protocol, back to baseline strictly counseled to abstain from alcohol, discharged on folic acid and thiamine, PCP to monitor.  Given NicoDerm patch.  Counseled to abstain all.   At risk for sleep apnea -Needs outpatient evaluation but has claustrophobia, current oxygen here.  Not on CPAP.  Quire outpatient sleep study per PCP.   Obesity   Body mass index is 31.12 kg/m.  With PCP for weight loss.   NIDDM-2 with hyperglycemia: A1c 7.3%.  New home regimen and follow-up with PCP.  Discharge diagnosis     Principal Problem:   CAP (community acquired pneumonia) Active Problems:   Essential hypertension   DM2 (diabetes mellitus, type 2) (HCC)   Chronic abdominal wound infection, sequela   Acute respiratory failure with hypoxia (HCC)   Chronic diastolic CHF (congestive heart failure) (HCC)   COPD with acute exacerbation (HCC)   HLD (hyperlipidemia)   Elevated troponin   Lactic acidosis   Allergic rhinitis    Discharge instructions    Discharge Instructions     Discharge instructions   Complete by: As directed    Follow with Primary MD [name], NP in 7 days   Get CBC, CMP, Magnesium, 2 view Chest X ray -  checked next visit with your primary MD    Activity: As tolerated with Full fall precautions use walker/cane & assistance as needed  Disposition Home   Diet: Heart Healthy low carbohydrate, 1.5 L total fluid restriction per day.  Check CBGs q. ACH S.  Check your Weight same time everyday, if you gain over 2 pounds, or you develop in leg swelling, experience more shortness of breath or chest pain, call your Primary MD immediately. Follow Cardiac Low Salt Diet and 1.5 lit/day fluid restriction.  Special Instructions: If you have smoked or chewed Tobacco  in the last 2 yrs please stop smoking, stop any regular Alcohol  and or any Recreational drug use.  On your next visit with your primary care physician please Get Medicines reviewed and adjusted.  Please request your Prim.MD to go over all Hospital Tests and Procedure/Radiological results at the follow up, please get all Hospital records sent to your Prim MD by signing hospital release before you go home.  If you experience worsening of your admission symptoms, develop shortness of breath, life threatening emergency, suicidal or homicidal thoughts you must  seek medical attention immediately by calling 911 or calling your MD immediately  if symptoms less severe.  You Must read complete instructions/literature along with all the possible adverse reactions/side effects for all the Medicines you take and that have been prescribed to you. Take any new Medicines after you have completely understood and accpet all the possible adverse reactions/side effects.   Do not drive when taking Pain medications.  Do not take more than prescribed Pain, Sleep and Anxiety Medications   Discharge wound care:   Complete by: As directed  Cleanse abdominal wound with Vashe Hart [place] # [phone]), apply silver hydrofiber dressing -Aquacel Ag+ [place] # [phone]), top with ABD pads, secure with tape. Change 2x wk.  Colostomy site care as before.   Increase activity slowly   Complete by: As directed        Discharge Medications   Allergies as of 08/14/2023       Reactions   Lisinopril    Other reaction(s): renal effects   Codeine Other (See Comments), Hives   unknown Other reaction(s): Other (See Comments) unknown unknown        Medication List     TAKE these medications    acetaminophen 650 MG CR tablet Commonly known as: Acetaminophen 8 Hour Take 1 tablet (650 mg total) by mouth every 8 (eight) hours as needed for pain What changed:  how much to take reasons to take this   amLODipine 10 MG tablet Commonly known as: NORVASC Take 1 tablet (10 mg total) by mouth daily.   Arexvy 120 MCG/0.5ML injection Generic drug: RSV vaccine recomb adjuvanted Inject into the muscle.   ascorbic acid 500 MG tablet Commonly known as: VITAMIN C Take 1 tablet (500 mg total) by mouth daily.   atorvastatin 10 MG tablet Commonly known as: LIPITOR Take 1 tablet (10 mg total) by mouth daily.   cetirizine 10 MG tablet Commonly known as: ZYRTEC Take 1 tablet (10 mg total) by mouth daily.   dapagliflozin propanediol 10 MG Tabs tablet Commonly known as: Farxiga Take  1 tablet (10 mg total) by mouth daily before breakfast.   fluticasone 110 MCG/ACT inhaler Commonly known as: FLOVENT HFA Inhale 2 puffs into the lungs 2 (two) times daily.   folic acid 1 MG tablet Commonly known as: FOLVITE Take 1 tablet (1 mg total) by mouth daily.   gabapentin 300 MG capsule Commonly known as: NEURONTIN Take 1 capsule (300 mg total) by mouth 2 (two) times daily. What changed:  how much to take when to take this   hydrOXYzine 50 MG capsule Commonly known as: VISTARIL Take 1 capsule (50 mg total) by mouth at bedtime as needed. What changed: when to take this   ipratropium-albuterol 0.5-2.5 (3) MG/3ML Soln Commonly known as: DUONEB Take 3 mLs by nebulization every 6 (six) hours as needed.   isosorbide mononitrate 30 MG 24 hr tablet Commonly known as: IMDUR Take 1 tablet (30 mg total) by mouth daily.   losartan 25 MG tablet Commonly known as: COZAAR Take 1 tablet (25 mg total) by mouth daily.   metFORMIN 500 MG tablet Commonly known as: GLUCOPHAGE Take 2 tablets (1,000 mg total) by mouth daily with breakfast AND 1 tablet (500 mg total) daily with supper. What changed: See the new instructions.   metoprolol succinate 25 MG 24 hr tablet Commonly known as: TOPROL-XL Take 1 tablet (25 mg total) by mouth daily.   multivitamin with minerals Tabs tablet Take 1 tablet by mouth every other day.   nicotine 21 mg/24hr patch Commonly known as: NICODERM CQ - dosed in mg/24 hours Place 1 patch (21 mg total) onto the skin daily.   Shingrix injection Generic drug: Zoster Vaccine Adjuvanted Inject into the muscle.   thiamine 100 MG tablet Commonly known as: VITAMIN B1 Take 1 tablet (100 mg total) by mouth daily.   torsemide 20 MG tablet Commonly known as: DEMADEX Take 1 tablet (20 mg total) by mouth daily.   triamcinolone cream 0.5 % Commonly known as: KENALOG Apply topically to legs twice daily  for up to 2 weeks as needed for eczema                Discharge Care Instructions  (From admission, onward)           Start     Ordered   08/14/23 0000  Discharge wound care:       Comments: Cleanse abdominal wound with Vashe [place] # [phone]), apply silver hydrofiber dressing -Aquacel Ag+ [place] # [phone]), top with ABD pads, secure with tape. Change 2x wk.  Colostomy site care as before.   08/14/23 8413             Follow-up Information     [name], NP. Schedule an appointment as soon as possible for a visit in 1 week(s).   Specialty: Nurse Practitioner Contact information: [address] [place] [address] [phone]         Jake Bathe, MD. Schedule an appointment as soon as possible for a visit in 1 week(s).   Specialty: Cardiology Contact information: [address] [place] [address] [phone]                 Major procedures and Radiology Reports - PLEASE review detailed and final reports thoroughly  -       DG Chest [facility] 1 View Result Date: 08/12/2023 CLINICAL DATA:  Shortness of breath. EXAM: PORTABLE CHEST 1 VIEW COMPARISON:  08/10/2023 FINDINGS: Cardiac enlargement. Aortic atherosclerosis. Pulmonary vascular congestion. No pleural effusion, airspace consolidation, atelectasis or pneumothorax. Visualized osseous structures are unremarkable. IMPRESSION: Cardiac enlargement and pulmonary vascular congestion. Electronically Signed   By: Signa Kell M.D.   On: 08/12/2023 08:29   DG Chest Port 1 View Result Date: 08/10/2023 CLINICAL DATA:  Shortness of breath. EXAM: PORTABLE CHEST 1 VIEW COMPARISON:  08/08/2023 FINDINGS: Aortic atherosclerotic calcifications. Stable cardiomediastinal contours. Increased pulmonary vascular congestion. Increase opacity within the left lower lung identified which may reflect atelectasis, pneumonia or pleural effusion. Veil like opacification over the right lower lung may reflect a small posterior layering effusion. IMPRESSION: 1.  Increased pulmonary vascular congestion. 2. Increased opacity within the left lower lung which may reflect atelectasis, pneumonia or pleural effusion. 3. Possible small posterior layering right pleural effusion. Electronically Signed   By: Signa Kell M.D.   On: 08/10/2023 06:46   DG Swallowing Func-Speech Pathology Result Date: 08/09/2023 Table formatting from the original result was not included. Modified Barium Swallow Study Patient Details Name: Luis [name] MRN: [id number] Date of Birth: 1960-08-24 Today's Date: 08/09/2023 HPI/PMH: HPI: Tadeo Besecker is a 63 yo male presenting to ED 2/23 with one week history of progressive shortness of breath, productive cough, wheezing, and orthopnea. Admitted with acute hypoxic respiratory failure in the setting of CAP and COPD exacerbation. Seen by SLP 12/25/22 with recommendations to continue regular diet. PMH includes COPD, diastolic CHF, CHB/PPM, T2DM, ileostomy, chronic abdominal wound, tobacco/alcohol use  . Clinical Impression: Clinical Impression: Pt exhibits an overall functional oropharyngeal swallow. He presents with increased WOB this date but protects his airway adequately with POs. He consistently has flash penetration with thin liquids that is independently cleared by subsequent swallows (PAS 2). This is considered WFL. No further penetration or aspiration occurred with any consistencies trialed. There is trace BOT residue that is ultimately cleared by subswallows. Provided education to pt regarding using increased caution in times of increased supplemental O2 requirement. Recommend he continue regular solids with thin liquids. No further SLP f/u is needed,  will sign off. Factors that may increase risk of adverse event in presence of aspiration Rubye Oaks & Clearance Coots 2021): Factors that may increase risk of adverse event in presence of aspiration Rubye Oaks & Clearance Coots 2021): Poor general health and/or compromised immunity; Limited mobility Recommendations/Plan:  Swallowing Evaluation Recommendations Swallowing Evaluation Recommendations Recommendations: PO diet PO Diet Recommendation: Regular; Thin liquids (Level 0) Liquid Administration via: Cup; Straw Medication Administration: Whole meds with liquid Supervision: Patient able to self-feed Swallowing strategies  : Minimize environmental distractions; Slow rate Postural changes: Position pt fully upright for meals Oral care recommendations: Oral care BID (2x/day) Treatment Plan Treatment Plan Treatment recommendations: No treatment recommended at this time Follow-up recommendations: No SLP follow up Recommendations Recommendations for follow up therapy are one component of a multi-disciplinary discharge planning process, led by the attending physician.  Recommendations may be updated based on patient status, additional functional criteria and insurance authorization. Assessment: Orofacial Exam: Orofacial Exam Oral Cavity: Oral Hygiene: WFL Oral Cavity - Dentition: Adequate natural dentition Orofacial Anatomy: WFL Oral Motor/Sensory Function: WFL Anatomy: Anatomy: WFL Boluses Administered: Boluses Administered Boluses Administered: Thin liquids (Level 0); Mildly thick liquids (Level 2, nectar thick); Moderately thick liquids (Level 3, honey thick); Puree; Solid  Oral Impairment Domain: Oral Impairment Domain Lip Closure: No labial escape Tongue control during bolus hold: Cohesive bolus between tongue to palatal seal Bolus preparation/mastication: Timely and efficient chewing and mashing Bolus transport/lingual motion: Brisk tongue motion Oral residue: Complete oral clearance Location of oral residue : N/A Initiation of pharyngeal swallow : Pyriform sinuses  Pharyngeal Impairment Domain: Pharyngeal Impairment Domain Soft palate elevation: No bolus between soft palate (SP)/pharyngeal wall (PW) Laryngeal elevation: Complete superior movement of thyroid cartilage with complete approximation of arytenoids to epiglottic petiole  Anterior hyoid excursion: Complete anterior movement Epiglottic movement: Complete inversion Laryngeal vestibule closure: Complete, no air/contrast in laryngeal vestibule Pharyngeal stripping wave : Present - complete Pharyngeal contraction (A/P view only): N/A Pharyngoesophageal segment opening: Complete distension and complete duration, no obstruction of flow Tongue base retraction: Trace column of contrast or air between tongue base and PPW Pharyngeal residue: Trace residue within or on pharyngeal structures Location of pharyngeal residue: Tongue base  Esophageal Impairment Domain: Esophageal Impairment Domain Esophageal clearance upright position: Complete clearance, esophageal coating Pill: Pill Consistency administered: Thin liquids (Level 0) Thin liquids (Level 0): [facility] Penetration/Aspiration Scale Score: Penetration/Aspiration Scale Score 1.  Material does not enter airway: Mildly thick liquids (Level 2, nectar thick); Moderately thick liquids (Level 3, honey thick); Puree; Solid; Pill 2.  Material enters airway, remains ABOVE vocal cords then ejected out: Thin liquids (Level 0) Compensatory Strategies: Compensatory Strategies Compensatory strategies: Yes Straw: Effective Effective Straw: Thin liquid (Level 0)   General Information: Caregiver present: No  Diet Prior to this Study: Regular; Thin liquids (Level 0)   Temperature : Normal   Respiratory Status: Increased WOB   Supplemental O2: High flow nasal cannula   History of Recent Intubation: No  Behavior/Cognition: Alert; Cooperative Self-Feeding Abilities: Able to self-feed Baseline vocal quality/speech: Dysphonic Volitional Cough: Able to elicit Volitional Swallow: Able to elicit Exam Limitations: No limitations Goal Planning: Prognosis for improved oropharyngeal function: Good Barriers to Reach Goals: Overall medical prognosis No data recorded Patient/Family Stated Goal: none stated Consulted and agree with results and recommendations: Patient Pain: Pain  Assessment Pain Assessment: No/denies pain End of Session: Start Time:SLP Start Time (ACUTE ONLY): 1158 Stop Time: SLP Stop Time (ACUTE ONLY): 1213 Time Calculation:SLP Time Calculation (min) (ACUTE ONLY): 15 min Charges: SLP  Evaluations $ SLP Speech Visit: 1 Visit SLP Evaluations $BSS Swallow: 1 Procedure $MBS Swallow: 1 Procedure SLP visit diagnosis: SLP Visit Diagnosis: Dysphagia, oropharyngeal phase (R13.12) Past Medical History: Past Medical History: Diagnosis Date  Acute respiratory failure with hypoxia (HCC) 12/23/2022  Alcohol withdrawal syndrome, with delirium (HCC) 12/27/2022  COPD (chronic obstructive pulmonary disease) (HCC)   Diabetes mellitus without complication (HCC)   Diverticulitis   DVT (deep venous thrombosis) (HCC)   x3  Encephalopathy acute 12/28/2022  ETOH abuse   H/O colectomy   Hyperlipidemia   Hypertension   Sepsis associated hypotension (HCC) 08/03/2019  Smoker  Past Surgical History: Past Surgical History: Procedure Laterality Date  COLOSTOMY    PACEMAKER LEADLESS INSERTION N/A 12/31/2020  Procedure: PACEMAKER LEADLESS INSERTION;  Surgeon: [name], MD;  Location: [facility] INVASIVE CV LAB;  Service: Cardiovascular;  Laterality: N/A;  TONSILLECTOMY   Gwynneth Aliment, M.A., CF-SLP Speech Language Pathology, Acute Rehabilitation Services Secure Chat preferred [phone] 08/09/2023, 2:01 PM  DG Chest Port 1 View Result Date: 08/08/2023 CLINICAL DATA:  Shortness of breath, pneumonia. EXAM: PORTABLE CHEST 1 VIEW COMPARISON:  Yesterday FINDINGS: Cardiomegaly and vascular congestion. Stable streaky density at the medial right base. No Kerley lines, visible effusion, or pneumothorax. IMPRESSION: Cardiomegaly and vascular congestion. Unchanged mild opacification of the right lung base. Electronically Signed   By: Tiburcio Pea M.D.   On: 08/08/2023 07:13   DG Chest Port 1 View Result Date: 08/07/2023 CLINICAL DATA:  [id number] with shortness of breath, pneumonia versus edema, and respiratory  distress. EXAM: PORTABLE CHEST 1 VIEW COMPARISON:  Portable chest yesterday at 4:36 p.m. FINDINGS: 6:13 a.m. The heart is moderately enlarged. A leadless pacemaker in right ventricle is again shown. The mediastinum is stable.  There is aortic atherosclerosis. Mild perihilar vascular congestion persists as well as mild basilar interstitial edema with small pleural effusions. Scattered airspace disease in both bases has partially cleared since yesterday, most likely clearing edema. Remaining lungs clear with COPD change. No other changes in the overall aeration. IMPRESSION: 1. Cardiomegaly with mild perihilar vascular congestion and mild basilar interstitial edema with small pleural effusions. 2. Scattered airspace disease in both bases has partially cleared since yesterday, most likely clearing edema. 3. COPD. 4. Aortic atherosclerosis. Electronically Signed   By: Almira Bar M.D.   On: 08/07/2023 06:25   CT ABDOMEN PELVIS W CONTRAST Result Date: 08/06/2023 CLINICAL DATA:  Abdominal pain EXAM: CT ABDOMEN AND PELVIS WITH CONTRAST TECHNIQUE: Multidetector CT imaging of the abdomen and pelvis was performed using the standard protocol following bolus administration of intravenous contrast. RADIATION DOSE REDUCTION: This exam was performed according to the departmental dose-optimization program which includes automated exposure control, adjustment of the mA and/or kV according to patient size and/or use of iterative reconstruction technique. CONTRAST:  75mL OMNIPAQUE IOHEXOL 350 MG/ML SOLN COMPARISON:  04/22/2023 FINDINGS: Lower chest: Bibasilar atelectasis or infiltrates. See chest CT report. Hepatobiliary: Liver is enlarged and low density compatible with fatty infiltration. Nodular contours compatible with cirrhosis. Findings stable since prior study. No focal hepatic abnormality. Gallbladder unremarkable. Pancreas: No focal abnormality or ductal dilatation. Spleen: No focal abnormality.  Normal size.  Adrenals/Urinary Tract: No adrenal abnormality. No focal renal abnormality. No stones or hydronephrosis. Urinary bladder is unremarkable. Stomach/Bowel: Prior colectomy. Right lower quadrant ileostomy. Stomach and small bowel decompressed. No bowel obstruction or inflammatory process. Vascular/Lymphatic: Diffuse aortoiliac atherosclerosis. No evidence of aneurysm or adenopathy. Reproductive: No visible focal abnormality. Other: Small amount of perihepatic ascites.  No free air.  Musculoskeletal: No acute bony abnormality. IMPRESSION: Changes of fatty liver, hepatomegaly and cirrhosis, stable. Small amount of perihepatic ascites. 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 No bowel obstruction. Aortoiliac atherosclerosis. No acute findings. Electronically Signed   By: Charlett Nose M.D.   On: 08/06/2023 20:23   CT Angio Chest PE W and/or Wo Contrast Result Date: 08/06/2023 CLINICAL DATA:  Pulmonary embolism (PE) suspected, high prob. Sob, hypoxia, hx of PE not anticoagulated, abd pain, abd wound; EXAM: CT ANGIOGRAPHY CHEST WITH CONTRAST TECHNIQUE: Multidetector CT imaging of the chest was performed using the standard protocol during bolus administration of intravenous contrast. Multiplanar CT image reconstructions and MIPs were obtained to evaluate the vascular anatomy. RADIATION DOSE REDUCTION: This exam was performed according to the departmental dose-optimization program which includes automated exposure control, adjustment of the mA and/or kV according to patient size and/or use of iterative reconstruction technique. CONTRAST:  75mL OMNIPAQUE IOHEXOL 350 MG/ML SOLN COMPARISON:  12/23/2022 FINDINGS: Cardiovascular: Cardiomegaly. Coronary artery and aortic atherosclerosis. No filling defects in the pulmonary arteries to suggest pulmonary emboli. Mediastinum/Nodes: No mediastinal, hilar, or axillary adenopathy. Trachea and esophagus are unremarkable. Thyroid unremarkable.  Lungs/Pleura: Peribronchial thickening diffusely, most pronounced in the lower lobes and similar to prior study. Bibasilar airspace opacities could reflect atelectasis or pneumonia. No effusions. Upper Abdomen: No acute findings Musculoskeletal: Chest wall soft tissues are unremarkable. No acute bony abnormality. Review of the MIP images confirms the above findings. IMPRESSION: No evidence of pulmonary embolus. Cardiomegaly, coronary artery disease. Bronchial wall thickening compatible with bronchitis. This is similar to prior study. Continued bibasilar airspace opacities, atelectasis versus pneumonia. Opacities less pronounced than prior study. Aortic Atherosclerosis (ICD10-I70.0). Electronically Signed   By: Charlett Nose M.D.   On: 08/06/2023 20:20   DG Chest Portable 1 View Result Date: 08/06/2023 CLINICAL DATA:  Shortness of breath and hypoxia EXAM: PORTABLE CHEST 1 VIEW COMPARISON:  Chest x-ray 02/17/2023. FINDINGS: There some patchy and interstitial opacities in both lung bases. The cardiomediastinal silhouette is stable, the heart is mildly enlarged. There is no pneumothorax or acute fracture. IMPRESSION: Patchy and interstitial opacities in both lung bases, concerning for pneumonia versus edema. Electronically Signed   By: Darliss Cheney M.D.   On: 08/06/2023 17:06   CUP PACEART INCLINIC DEVICE CHECK Result Date: 07/17/2023 Leadless check in clinic. Please see scanned/attached report.  Longevity _7.7 years_. VS _0.7_%, AM-VP _98.9_%,  VP _0.3_%. pt is in Aflutter underlying, HR histograms programmed VDD mostly 90's-100's > some 110-120 tracking his flutter in a 3:1 discussed in note not a candidate for OAC no plns for rhythm management at this juncture programmed VVIR 60-120 RU   Micro Results     Recent Results (from the past 240 hours)  Resp panel by RT-PCR (RSV, Flu A&B, Covid) Anterior Nasal Swab     Status: None   Collection Time: 08/06/23  4:07 PM   Specimen: Anterior Nasal Swab  Result  Value Ref Range Status   SARS Coronavirus 2 by RT PCR NEGATIVE NEGATIVE Final   Influenza A by PCR NEGATIVE NEGATIVE Final   Influenza B by PCR NEGATIVE NEGATIVE Final    Comment: (NOTE) The Xpert Xpress SARS-CoV-2/FLU/RSV plus assay is intended as an aid in the diagnosis of influenza from Nasopharyngeal swab specimens and should not be used as a sole basis for treatment. Nasal washings and aspirates are unacceptable for Xpert Xpress SARS-CoV-2/FLU/RSV testing.  Fact Sheet for Patients: BloggerCourse.com  Fact Sheet for Healthcare Providers: SeriousBroker.it  This  test is not yet approved or cleared by the [place] and has been authorized for detection and/or diagnosis of SARS-CoV-2 by FDA under an Emergency Use Authorization (EUA). This EUA will remain in effect (meaning this test can be used) for the duration of the COVID-19 declaration under Section 564(b)(1) of the Act, 21 U.S.C. section 360bbb-3(b)(1), unless the authorization is terminated or revoked.     Resp Syncytial Virus by PCR NEGATIVE NEGATIVE Final    Comment: (NOTE) Fact Sheet for Patients: BloggerCourse.com  Fact Sheet for Healthcare Providers: SeriousBroker.it  This test is not yet approved or cleared by the [place] FDA and has been authorized for detection and/or diagnosis of SARS-CoV-2 by FDA under an Emergency Use Authorization (EUA). This EUA will remain in effect (meaning this test can be used) for the duration of the COVID-19 declaration under Section 564(b)(1) of the Act, 21 U.S.C. section 360bbb-3(b)(1), unless the authorization is terminated or revoked.  Performed at [facility], [address] [place], [place] [address]   Blood culture (routine x 2)     Status: None   Collection Time: 08/06/23  5:00 PM   Specimen: BLOOD  Result Value Ref Range Status   Specimen Description  BLOOD SITE NOT SPECIFIED  Final   Special Requests   Final    BOTTLES DRAWN AEROBIC AND ANAEROBIC Blood Culture adequate volume   Culture   Final    NO GROWTH 5 DAYS Performed at [facility], [address], [place], [place] [address]    Report Status 08/11/2023 FINAL  Final  Blood culture (routine x 2)     Status: None   Collection Time: 08/06/23  5:20 PM   Specimen: BLOOD  Result Value Ref Range Status   Specimen Description BLOOD SITE NOT SPECIFIED  Final   Special Requests   Final    BOTTLES DRAWN AEROBIC AND ANAEROBIC Blood Culture adequate volume   Culture   Final    NO GROWTH 5 DAYS Performed at [facility], [address] [place], [place] [address]    Report Status 08/11/2023 FINAL  Final  MRSA Next Gen by PCR, Nasal     Status: Abnormal   Collection Time: 08/08/23  5:48 AM   Specimen: Nasal Mucosa; Nasal Swab  Result Value Ref Range Status   MRSA by PCR Next Gen DETECTED (A) NOT DETECTED Final    Comment: RESULT CALLED TO, READ BACK BY AND VERIFIED WITH: Dorothea Glassman 2/25 0834 MSG (NOTE) The GeneXpert MRSA Assay (FDA approved for NASAL specimens only), is one component of a comprehensive MRSA colonization surveillance program. It is not intended to diagnose MRSA infection nor to guide or monitor treatment for MRSA infections. Test performance is not FDA approved in patients less than 62 years old. Performed at [facility], [address] [place], [place] [address]     Today   Subjective    Luis [name] today has no headache,no chest abdominal pain,no new weakness tingling or numbness, feels much better wants to go home today.     Objective   Blood pressure (!) 141/78, pulse 60, temperature 98 F (36.7 C), temperature source Oral, resp. rate 18, height 6\' 1"  (1.854 m), weight 102.6 kg, SpO2 94%.   Intake/Output Summary (Last 24 hours) at 08/14/2023 0950 Last data filed at 08/14/2023 0525 Gross per 24 hour  Intake --  Output 1950 ml   Net -1950 ml    Exam  Awake  Alert, No new F.N deficits,    Louann.AT,PERRAL Supple Neck,   Symmetrical Chest wall movement, Good air movement bilaterally, CTAB RRR,No Gallops,   +ve B.Sounds, Abd Soft, Non tender,  No Cyanosis, Clubbing or edema    Data Review   Recent Labs  Lab 08/08/23 0427 08/09/23 0446 08/10/23 0514 08/11/23 0528  WBC 13.4* 13.8* 11.5* 10.6*  HGB 12.3* 12.5* 12.8* 13.8  HCT 37.2* 38.4* 38.6* 40.8  PLT 198 201 216 212  MCV 103.3* 103.8* 103.5* 101.2*  MCH 34.2* 33.8 34.3* 34.2*  MCHC 33.1 32.6 33.2 33.8  RDW 15.1 14.9 14.9 14.5  LYMPHSABS  --  0.6* 1.1 1.3  MONOABS  --  1.1* 1.2* 1.1*  EOSABS  --  0.0 0.0 0.0  BASOSABS  --  0.0 0.0 0.0    Recent Labs  Lab 08/07/23 1841 08/08/23 0425 08/08/23 0427 08/09/23 0446 08/10/23 0514 08/11/23 0528 08/13/23 0532 08/14/23 0449  NA 131*  --  130* 136 140 137 137  --   K 6.0*  --  5.2* 4.8 4.3 4.2 3.4* 4.1  CL 97*  --  100 102 102 97* 95*  --   CO2 23  --  23 22 28 29 28   --   ANIONGAP 11  --  7 12 10 11 14   --   GLUCOSE 253*  --  324* 226* 138* 151* 128*  --   BUN 38*  --  40* 38* 30* 36* 49*  --   CREATININE 1.54*  --  1.31* 1.15 0.99 1.00 1.05  --   AST  --   --  32  --  77* 76*  --   --   ALT  --   --  30  --  96* 136*  --   --   ALKPHOS  --   --  111  --  116 123  --   --   BILITOT  --   --  0.7  --  1.1 1.1  --   --   ALBUMIN 3.7  --  3.7  --  4.0 3.9  --   --   CRP  --   --  3.8* 1.4* 0.6 1.6*  --   --   PROCALCITON  --   --  4.53 1.82 0.77 0.43  --   --   BNP  --  527.4*  --  611.2* 513.2* 357.6*  --   --   MG  --   --  2.6* 2.7* 2.5* 2.4 2.3  --   PHOS 2.7  --  3.9  --   --   --   --   --   CALCIUM 8.9  --  8.2* 9.0 9.1 9.4 9.9  --     Total Time in preparing paper work, data evaluation and todays exam - 35 minutes  Signature  -    Susa Raring M.D on 08/14/2023 at 9:50 AM   -  To page go to [email or web address]

## 2023-08-15 ENCOUNTER — Inpatient Hospital Stay (HOSPITAL_COMMUNITY): Admit: 2023-08-15 | Payer: Medicaid Other | Admitting: Nurse Practitioner

## 2023-08-17 ENCOUNTER — Other Ambulatory Visit (HOSPITAL_COMMUNITY): Payer: Self-pay

## 2023-08-17 ENCOUNTER — Other Ambulatory Visit: Payer: Self-pay | Admitting: Nurse Practitioner

## 2023-08-17 ENCOUNTER — Other Ambulatory Visit: Payer: Self-pay

## 2023-08-17 DIAGNOSIS — G4700 Insomnia, unspecified: Secondary | ICD-10-CM

## 2023-08-17 DIAGNOSIS — I10 Essential (primary) hypertension: Secondary | ICD-10-CM

## 2023-08-17 MED ORDER — FOLIC ACID 1 MG PO TABS
1.0000 mg | ORAL_TABLET | Freq: Every day | ORAL | 0 refills | Status: DC
  Filled 2023-08-17: qty 90, 90d supply, fill #0

## 2023-08-17 MED ORDER — HYDROXYZINE PAMOATE 50 MG PO CAPS
50.0000 mg | ORAL_CAPSULE | Freq: Every evening | ORAL | 0 refills | Status: DC | PRN
  Filled 2023-08-17: qty 30, 30d supply, fill #0

## 2023-08-17 MED ORDER — AMLODIPINE BESYLATE 10 MG PO TABS
10.0000 mg | ORAL_TABLET | Freq: Every day | ORAL | 0 refills | Status: DC
  Filled 2023-08-17: qty 90, 90d supply, fill #0

## 2023-08-22 ENCOUNTER — Ambulatory Visit (HOSPITAL_COMMUNITY)
Admission: RE | Admit: 2023-08-22 | Discharge: 2023-08-22 | Disposition: A | Discharge status: Home / Self Care | Source: Ambulatory Visit | Attending: *Deleted | Admitting: *Deleted

## 2023-08-22 ENCOUNTER — Telehealth: Payer: Self-pay

## 2023-08-22 DIAGNOSIS — T8189XD Other complications of procedures, not elsewhere classified, subsequent encounter: Secondary | ICD-10-CM | POA: Diagnosis not present

## 2023-08-22 DIAGNOSIS — K469 Unspecified abdominal hernia without obstruction or gangrene: Secondary | ICD-10-CM | POA: Diagnosis not present

## 2023-08-22 DIAGNOSIS — Z932 Ileostomy status: Secondary | ICD-10-CM | POA: Diagnosis not present

## 2023-08-22 DIAGNOSIS — Z432 Encounter for attention to ileostomy: Secondary | ICD-10-CM

## 2023-08-22 DIAGNOSIS — Z7689 Persons encountering health services in other specified circumstances: Secondary | ICD-10-CM | POA: Diagnosis not present

## 2023-08-22 DIAGNOSIS — L24B3 Irritant contact dermatitis related to fecal or urinary stoma or fistula: Secondary | ICD-10-CM | POA: Diagnosis not present

## 2023-08-22 DIAGNOSIS — T8189XA Other complications of procedures, not elsewhere classified, initial encounter: Secondary | ICD-10-CM | POA: Diagnosis not present

## 2023-08-22 DIAGNOSIS — S31109A Unspecified open wound of abdominal wall, unspecified quadrant without penetration into peritoneal cavity, initial encounter: Secondary | ICD-10-CM | POA: Diagnosis not present

## 2023-08-22 NOTE — Progress Notes (Signed)
...   Medicaid Managed Care   Unsuccessful Outreach Note  08/22/2023 Name: Luis [name] MRN: [id number] DOB: 1960/08/31  Referred by: [name], NP Reason for referral : High Risk Managed Medicaid (I called the patient today to get his missed phone appt with the [facility] RNCM rescheduled. He did not answer and there was not a VM.)   A second unsuccessful telephone outreach was attempted today. The patient was referred to the case management team for assistance with care management and care coordination.   Follow Up Plan: The care management team will reach out to the patient again over the next 7 days.   [facility], [facility] Guide Direct Dial: [phone]  Fax: [phone]

## 2023-08-22 NOTE — Progress Notes (Signed)
 [facility]   Reason for visit:  RLQ ileostomy Abdominal hernia Nonhealing abdominal wounds HPI:  Diverticulitis with end ileostomy  abdominal hernia  nonhealing wound to abdomen Past Medical History:  Diagnosis Date  . Acute respiratory failure with hypoxia (HCC) 12/23/2022  . Alcohol withdrawal syndrome, with delirium (HCC) 12/27/2022  . COPD (chronic obstructive pulmonary disease) (HCC)   . Diabetes mellitus without complication (HCC)   . Diverticulitis   . DVT (deep venous thrombosis) (HCC)    x3  . Encephalopathy acute 12/28/2022  . ETOH abuse   . H/O colectomy   . Hyperlipidemia   . Hypertension   . Sepsis associated hypotension (HCC) 08/03/2019  . Smoker    Family History  Problem Relation Age of Onset  . COPD Mother   . Prostate cancer Father   . Colon cancer Neg Hx   . Rectal cancer Neg Hx   . Stomach cancer Neg Hx   . Esophageal cancer Neg Hx    Allergies  Allergen Reactions  . Lisinopril     Other reaction(s): renal effects  . Codeine Other (See Comments) and Hives    unknown Other reaction(s): Other (See Comments) unknown unknown   Current Outpatient Medications  Medication Sig Dispense Refill Last Dose/Taking  . acetaminophen (ACETAMINOPHEN 8 HOUR) 650 MG CR tablet Take 1 tablet (650 mg total) by mouth every 8 (eight) hours as needed for pain (Patient taking differently: Take 1,300 mg by mouth every 8 (eight) hours as needed for pain or fever.) 90 tablet 1   . amLODipine (NORVASC) 10 MG tablet Take 1 tablet (10 mg total) by mouth daily. 90 tablet 0   . ascorbic acid (VITAMIN C) 500 MG tablet Take 1 tablet (500 mg total) by mouth daily. 30 tablet 1   . atorvastatin (LIPITOR) 10 MG tablet Take 1 tablet (10 mg total) by mouth daily. 90 tablet 0   . cetirizine (ZYRTEC) 10 MG tablet Take 1 tablet (10 mg total) by mouth daily. 90 tablet 1   . dapagliflozin propanediol (FARXIGA) 10 MG TABS tablet Take 1 tablet (10 mg total) by mouth daily before  breakfast. 90 tablet 2   . fluticasone (FLOVENT HFA) 110 MCG/ACT inhaler Inhale 2 puffs into the lungs 2 (two) times daily. 1 each 2   . folic acid (FOLVITE) 1 MG tablet Take 1 tablet (1 mg total) by mouth daily. 90 tablet 0   . gabapentin (NEURONTIN) 300 MG capsule Take 1 capsule (300 mg total) by mouth 2 (two) times daily. (Patient taking differently: Take 600 mg by mouth at bedtime.) 90 capsule 3   . hydrOXYzine (VISTARIL) 50 MG capsule Take 1 capsule (50 mg total) by mouth at bedtime as needed. 30 capsule 0   . ipratropium-albuterol (DUONEB) 0.5-2.5 (3) MG/3ML SOLN Take 3 mLs by nebulization every 6 (six) hours as needed.     . isosorbide mononitrate (IMDUR) 30 MG 24 hr tablet Take 1 tablet (30 mg total) by mouth daily. 30 tablet 0   . losartan (COZAAR) 25 MG tablet Take 1 tablet (25 mg total) by mouth daily. 30 tablet 5   . metFORMIN (GLUCOPHAGE) 500 MG tablet Take 2 tablets (1,000 mg total) by mouth daily with breakfast AND 1 tablet (500 mg total) daily with supper. (Patient taking differently: Take 2 tablets (1,000 mg total) by mouth daily ) 90 tablet 3   . metoprolol succinate (TOPROL-XL) 25 MG 24 hr tablet Take 1 tablet (25 mg total) by mouth daily.  90 tablet 3   . Multiple Vitamin (MULTIVITAMIN WITH MINERALS) TABS tablet Take 1 tablet by mouth every other day.      . nicotine (NICODERM CQ - DOSED IN MG/24 HOURS) 21 mg/24hr patch Place 1 patch (21 mg total) onto the skin daily. 28 patch 0   . thiamine (VITAMIN B1) 100 MG tablet Take 1 tablet (100 mg total) by mouth daily. 90 tablet 0   . torsemide (DEMADEX) 20 MG tablet Take 1 tablet (20 mg total) by mouth daily. 30 tablet 6   . triamcinolone cream (KENALOG) 0.5 % Apply topically to legs twice daily for up to 2 weeks as needed for eczema 30 g 1   . Zoster Vaccine Adjuvanted [facility]) injection Inject into the muscle. 1 each 1    No current facility-administered medications for this encounter.   ROS  Review of Systems  Constitutional:   Positive for fatigue.  HENT:  Positive for sneezing.   Respiratory:  Positive for cough and shortness of breath.   Gastrointestinal:        Abdominal hernia ileostomy  Musculoskeletal:  Positive for gait problem.  Neurological:        Neuropathy in feet.   All other systems reviewed and are negative. Vital signs:  BP 135/67   Pulse 63   Temp 98.2 F (36.8 C)   Resp (!) 22   SpO2 94%  Exam:  Physical Exam Vitals reviewed.  Constitutional:      Appearance: He is obese.  Cardiovascular:     Rate and Rhythm: Normal rate and regular rhythm.     Comments: Pacemaker  Musculoskeletal:     Comments: Weakness, uses wheelchair in hospital  Neurological:     Mental Status: He is alert.    Stoma type/location:  RLq ileostomy Stomal assessment/size:  1" flush pink and productive liquid brown stool Peristomal assessment:  erythema and tenderness Treatment options for stomal/peristomal skin: barrier ring, 1 piece flat pouch Output: liquid brown stool Ostomy pouching: 1pc flat with barrier ring  Education provided:  no change in pouching  Wound cleansed with VASHE, aquacel to open wounds, Desitin to abdominal pannus. COver with ABD pad and tape.     Impression/dx  Ileostomy Aquacel to wounds Recent hospitalization for respiratory failure.   Discussion  Is feeling stronger , no changes in pouch or wound care Plan  See back as needed.     Visit time: 45 minutes.   Mike Gip FNP-BC

## 2023-08-22 NOTE — Discharge Instructions (Signed)
 NO changes! Order supplies

## 2023-08-23 ENCOUNTER — Other Ambulatory Visit: Payer: Self-pay | Admitting: Nurse Practitioner

## 2023-08-23 ENCOUNTER — Other Ambulatory Visit (HOSPITAL_COMMUNITY): Payer: Self-pay

## 2023-08-23 MED ORDER — TORSEMIDE 20 MG PO TABS
20.0000 mg | ORAL_TABLET | Freq: Every day | ORAL | 6 refills | Status: DC
  Filled 2023-08-23: qty 30, 30d supply, fill #0

## 2023-08-23 MED FILL — Triamcinolone Acetonide Cream 0.5%: CUTANEOUS | 14 days supply | Qty: 30 | Fill #0 | Status: AC

## 2023-08-24 ENCOUNTER — Ambulatory Visit: Admitting: Nurse Practitioner

## 2023-08-24 ENCOUNTER — Other Ambulatory Visit: Payer: Self-pay

## 2023-08-24 ENCOUNTER — Other Ambulatory Visit (HOSPITAL_BASED_OUTPATIENT_CLINIC_OR_DEPARTMENT_OTHER): Payer: Self-pay

## 2023-08-24 ENCOUNTER — Other Ambulatory Visit (HOSPITAL_COMMUNITY): Payer: Self-pay

## 2023-08-24 ENCOUNTER — Ambulatory Visit (INDEPENDENT_AMBULATORY_CARE_PROVIDER_SITE_OTHER)

## 2023-08-24 VITALS — BP 132/70 | HR 70 | Temp 97.9°F | Ht 73.0 in | Wt 233.0 lb

## 2023-08-24 DIAGNOSIS — J189 Pneumonia, unspecified organism: Secondary | ICD-10-CM

## 2023-08-24 DIAGNOSIS — I5042 Chronic combined systolic (congestive) and diastolic (congestive) heart failure: Secondary | ICD-10-CM

## 2023-08-24 DIAGNOSIS — Z7689 Persons encountering health services in other specified circumstances: Secondary | ICD-10-CM | POA: Diagnosis not present

## 2023-08-24 DIAGNOSIS — R21 Rash and other nonspecific skin eruption: Secondary | ICD-10-CM | POA: Diagnosis not present

## 2023-08-24 DIAGNOSIS — R350 Frequency of micturition: Secondary | ICD-10-CM

## 2023-08-24 NOTE — Progress Notes (Signed)
 Established Patient Office Visit  Subjective   Patient ID: Luis [name], male    DOB: 1960-11-05  Age: 63 y.o. MRN: [id number]  Chief Complaint  Patient presents with   Pneumonia    Patient has today for posthospitalization visit.  He was hospitalized from 08/06/2023 through 08/14/2023.  Ultimately diagnosed with pneumonia further complicated by COPD and heart failure.  He was treated with antibiotics, oxygen therapy, steroids, also diuresed and 20 L was removed from him.  He was told to follow a strict fluid restriction of 1.5 L/day as well as a heart healthy low carbohydrate diet.  Patient will admit that he is not following these recommendations currently.  He continues to drink what ever drinks he wants and continues to drink alcohol as well as smokes cigarettes.  He does report having been successful in cutting back on alcohol intake and cigarette intake.  Overall today feels really well.  He does have a rash on bilateral upper arms that appear somewhat vesicular but is not causing pain or itching.  He tried at home steroid cream without improvement in rash.  This erupted yesterday.  She also reports urinary frequency and feeling as though he is not completely emptying bladder.  Has family history of prostate cancer.    ROS: see HPI    Objective:     BP 132/70   Pulse 70   Temp 97.9 F (36.6 C) (Temporal)   Ht 6\' 1"  (1.854 m)   Wt 233 lb (105.7 kg)   SpO2 94%   BMI 30.74 kg/m  BP Readings from Last 3 Encounters:  08/24/23 132/70  08/22/23 135/67  08/14/23 136/85   Wt Readings from Last 3 Encounters:  08/24/23 233 lb (105.7 kg)  08/14/23 226 lb 1.3 oz (102.6 kg)  07/17/23 236 lb (107 kg)      Physical Exam Vitals reviewed.  Constitutional:      Appearance: Normal appearance.  HENT:     Head: Normocephalic and atraumatic.  Cardiovascular:     Rate and Rhythm: Normal rate and regular rhythm.  Pulmonary:     Effort: Pulmonary effort is normal.     Breath sounds:  Normal breath sounds.  Musculoskeletal:     Cervical back: Neck supple.  Skin:    General: Skin is warm and dry.          Comments: Red lines indicate area of rash.  Multiple small vesicles atop of erythematous area.  No open/draining lesions, no edema.  Nontender to touch, not hot to touch.  Neurological:     Mental Status: He is alert and oriented to person, place, and time.  Psychiatric:        Mood and Affect: Mood normal.        Behavior: Behavior normal.        Thought Content: Thought content normal.        Judgment: Judgment normal.      No results found for any visits on 08/24/23.    The ASCVD Risk score (Arnett DK, et al., 2019) failed to calculate for the following reasons:   The valid total cholesterol range is 130 to 320 mg/dL    Assessment & Plan:   Problem List Items Addressed This Visit       Cardiovascular and Mediastinum   Chronic combined systolic and diastolic heart failure (HCC)   Chronic Appears euvolemic on exam Blood pressure stable Overall symptoms are stable Continue amlodipine 10 mg daily, Farxiga 10 mg daily, Imdur  30 mg daily, losartan 25 mg daily, metoprolol 25 mg daily, torsemide 20 mg daily      Relevant Orders   Basic metabolic panel   CBC   Sedimentation rate   C-reactive protein   Ambulatory referral to Home Health     Respiratory   Pneumonia due to infectious organism   Acute, improved Clinically much improved.  As recommended by discharging physician will repeat chest x-ray.  Unfortunately lab and x-ray close at the time of appointment so patient will return to clinic either tomorrow or Monday to have imaging and labs collected.      Relevant Orders   DG Chest 2 View     Musculoskeletal and Integument   Rash - Primary   Etiology unclear Will check sed rate and CRP to screen for vasculitis.  Also patient was encouraged to follow-up with dermatology for further evaluation      Relevant Orders   Basic metabolic panel    CBC   Sedimentation rate   C-reactive protein     Other   Frequency of micturition   Etiology unclear Suspect either UTI, BPH, or medication side effect.  Will collect labs to test for UTI, check PSA, further recommendations to be made based on the results.      Relevant Orders   PSA   Urinalysis, Routine w reflex microscopic   Urine Culture    Return for f/u as scheduled.  Total time spent on encounter today was 30 minutes including face-to-face evaluation, review of previous medical records, and development/discussion of treatment plan   [name], NP

## 2023-08-24 NOTE — Assessment & Plan Note (Signed)
 Acute, improved Clinically much improved.  As recommended by discharging physician will repeat chest x-ray.  Unfortunately lab and x-ray close at the time of appointment so patient will return to clinic either tomorrow or Monday to have imaging and labs collected.

## 2023-08-24 NOTE — Assessment & Plan Note (Signed)
 Chronic Appears euvolemic on exam Blood pressure stable Overall symptoms are stable Continue amlodipine 10 mg daily, Farxiga 10 mg daily, Imdur 30 mg daily, losartan 25 mg daily, metoprolol 25 mg daily, torsemide 20 mg daily

## 2023-08-24 NOTE — Patient Instructions (Signed)
 Call dermatologist for rash

## 2023-08-24 NOTE — Assessment & Plan Note (Signed)
 Etiology unclear Suspect either UTI, BPH, or medication side effect.  Will collect labs to test for UTI, check PSA, further recommendations to be made based on the results.

## 2023-08-24 NOTE — Assessment & Plan Note (Signed)
 Etiology unclear Will check sed rate and CRP to screen for vasculitis.  Also patient was encouraged to follow-up with dermatology for further evaluation

## 2023-08-28 ENCOUNTER — Ambulatory Visit (HOSPITAL_COMMUNITY)
Admission: RE | Admit: 2023-08-28 | Discharge: 2023-08-28 | Disposition: A | Discharge status: Home / Self Care | Source: Ambulatory Visit | Attending: Plastic Surgery | Admitting: Plastic Surgery

## 2023-08-28 DIAGNOSIS — Z7689 Persons encountering health services in other specified circumstances: Secondary | ICD-10-CM | POA: Diagnosis not present

## 2023-08-28 DIAGNOSIS — T8189XA Other complications of procedures, not elsewhere classified, initial encounter: Secondary | ICD-10-CM | POA: Diagnosis not present

## 2023-08-28 DIAGNOSIS — K9419 Other complications of enterostomy: Secondary | ICD-10-CM | POA: Diagnosis not present

## 2023-08-28 DIAGNOSIS — Y838 Other surgical procedures as the cause of abnormal reaction of the patient, or of later complication, without mention of misadventure at the time of the procedure: Secondary | ICD-10-CM | POA: Insufficient documentation

## 2023-08-28 DIAGNOSIS — Z932 Ileostomy status: Secondary | ICD-10-CM | POA: Diagnosis not present

## 2023-08-28 DIAGNOSIS — L500 Allergic urticaria: Secondary | ICD-10-CM

## 2023-08-28 DIAGNOSIS — L24B3 Irritant contact dermatitis related to fecal or urinary stoma or fistula: Secondary | ICD-10-CM | POA: Diagnosis not present

## 2023-08-28 NOTE — Progress Notes (Signed)
 [facility]   Reason for visit:  RLQ ileostomy with abdominal hernia and nonhealing wound HPI:  Diverticulitis with perforation and end ileostomy.  Abdominal hernia and nonhealing surgical wound  Past Medical History:  Diagnosis Date  . Acute respiratory failure with hypoxia (HCC) 12/23/2022  . Alcohol withdrawal syndrome, with delirium (HCC) 12/27/2022  . COPD (chronic obstructive pulmonary disease) (HCC)   . Diabetes mellitus without complication (HCC)   . Diverticulitis   . DVT (deep venous thrombosis) (HCC)    x3  . Encephalopathy acute 12/28/2022  . ETOH abuse   . H/O colectomy   . Hyperlipidemia   . Hypertension   . Sepsis associated hypotension (HCC) 08/03/2019  . Smoker    Family History  Problem Relation Age of Onset  . COPD Mother   . Prostate cancer Father   . Colon cancer Neg Hx   . Rectal cancer Neg Hx   . Stomach cancer Neg Hx   . Esophageal cancer Neg Hx    Allergies  Allergen Reactions  . Lisinopril     Other reaction(s): renal effects  . Codeine Other (See Comments) and Hives    unknown Other reaction(s): Other (See Comments) unknown unknown   Current Outpatient Medications  Medication Sig Dispense Refill Last Dose/Taking  . acetaminophen (ACETAMINOPHEN 8 HOUR) 650 MG CR tablet Take 1 tablet (650 mg total) by mouth every 8 (eight) hours as needed for pain (Patient taking differently: Take 1,300 mg by mouth every 8 (eight) hours as needed for pain or fever.) 90 tablet 1   . amLODipine (NORVASC) 10 MG tablet Take 1 tablet (10 mg total) by mouth daily. 90 tablet 0   . ascorbic acid (VITAMIN C) 500 MG tablet Take 1 tablet (500 mg total) by mouth daily. 30 tablet 1   . atorvastatin (LIPITOR) 10 MG tablet Take 1 tablet (10 mg total) by mouth daily. 90 tablet 0   . cetirizine (ZYRTEC) 10 MG tablet Take 1 tablet (10 mg total) by mouth daily. 90 tablet 1   . dapagliflozin propanediol (FARXIGA) 10 MG TABS tablet Take 1 tablet (10 mg total) by  mouth daily before breakfast. 90 tablet 2   . fluticasone (FLOVENT HFA) 110 MCG/ACT inhaler Inhale 2 puffs into the lungs 2 (two) times daily. 1 each 2   . folic acid (FOLVITE) 1 MG tablet Take 1 tablet (1 mg total) by mouth daily. 90 tablet 0   . gabapentin (NEURONTIN) 300 MG capsule Take 1 capsule (300 mg total) by mouth 2 (two) times daily. (Patient taking differently: Take 600 mg by mouth at bedtime.) 90 capsule 3   . hydrOXYzine (VISTARIL) 50 MG capsule Take 1 capsule (50 mg total) by mouth at bedtime as needed. 30 capsule 0   . ipratropium-albuterol (DUONEB) 0.5-2.5 (3) MG/3ML SOLN Take 3 mLs by nebulization every 6 (six) hours as needed.     . isosorbide mononitrate (IMDUR) 30 MG 24 hr tablet Take 1 tablet (30 mg total) by mouth daily. 30 tablet 0   . losartan (COZAAR) 25 MG tablet Take 1 tablet (25 mg total) by mouth daily. 30 tablet 5   . metFORMIN (GLUCOPHAGE) 500 MG tablet Take 2 tablets (1,000 mg total) by mouth daily with breakfast AND 1 tablet (500 mg total) daily with supper. (Patient taking differently: Take 2 tablets (1,000 mg total) by mouth daily ) 90 tablet 3   . metoprolol succinate (TOPROL-XL) 25 MG 24 hr tablet Take 1 tablet (25 mg total)  by mouth daily. 90 tablet 3   . Multiple Vitamin (MULTIVITAMIN WITH MINERALS) TABS tablet Take 1 tablet by mouth every other day.      . nicotine (NICODERM CQ - DOSED IN MG/24 HOURS) 21 mg/24hr patch Place 1 patch (21 mg total) onto the skin daily. 28 patch 0   . thiamine (VITAMIN B1) 100 MG tablet Take 1 tablet (100 mg total) by mouth daily. 90 tablet 0   . torsemide (DEMADEX) 20 MG tablet Take 1 tablet (20 mg total) by mouth daily. 30 tablet 6   . triamcinolone cream (KENALOG) 0.5 % Apply topically to legs twice daily for up to 2 weeks as needed for eczema 30 g 1   . Zoster Vaccine Adjuvanted [facility]) injection Inject into the muscle. 1 each 1    No current facility-administered medications for this encounter.   ROS  Review of Systems   Constitutional:  Positive for fatigue.  Respiratory:  Positive for cough and shortness of breath.   Cardiovascular:        Pacemaker   Gastrointestinal:        RLQ ileostomy hernia  Musculoskeletal:  Positive for gait problem and myalgias.  Skin:  Positive for color change, pallor, rash and wound.  Allergic/Immunologic: Positive for environmental allergies.  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    All other systems reviewed and are negative. Vital signs:  BP (!) 146/70 (BP Location: Right Arm)   Pulse 62   Temp 97.7 F (36.5 C) (Oral)   Resp 18   SpO2 94%  Exam:  Physical Exam Vitals reviewed.  Constitutional:      Appearance: He is obese.  Cardiovascular:     Rate and Rhythm: Normal rate and regular rhythm.  Pulmonary:     Effort: Pulmonary effort is normal.     Breath sounds: Wheezing present.  Abdominal:     Hernia: A hernia is present.     Comments: Nonhealing abdominal wound  Musculoskeletal:     Comments: Uses wheelchair in hospital  Skin:    General: Skin is warm and dry.     Findings: Erythema, lesion and rash present.  Neurological:     Mental Status: He is alert and oriented to person, place, and time. Mental status is at baseline.     Gait: Gait abnormal.  Psychiatric:        Mood and Affect: Mood normal.        Behavior: Behavior normal.    Stoma type/location:  RLQ recessed stoma Stomal assessment/size:  1"  Peristomal assessment:  chronic erythema and skin breakdown that resolves and returns episodically Treatment options for stomal/peristomal skin: 1 piece flat with barrier ring Output: soft brown stool Ostomy pouching: 1pc. Flat with barrier ring  Education provided:  Has upcoming appointment with [facility] regarding hernia repair and ostomy reversal.     Impression/dx  Nonhealing abdominal wound Ileostomy Abdominal hernia Discussion  Has urticaria with erythematous lesions to bilateral arms, back and  upper abdomen.  We discussed allergen exposure and he has worn a new shirt that has not been laundered.  He has triamcinolone cream and will apply that to the rash.   Wound care to abdomen with VASHE cleanser, aquacel to open weeping lesions and ABD pads and tape.   Plan  See back as needed    Visit time: 45 minutes.   Mike Gip FNP-BC

## 2023-08-29 DIAGNOSIS — L500 Allergic urticaria: Secondary | ICD-10-CM | POA: Insufficient documentation

## 2023-08-29 DIAGNOSIS — K439 Ventral hernia without obstruction or gangrene: Secondary | ICD-10-CM | POA: Diagnosis not present

## 2023-08-29 NOTE — Discharge Instructions (Signed)
 Triamcinolone cream to itchy rash. See back as needed

## 2023-08-31 ENCOUNTER — Telehealth: Payer: Self-pay

## 2023-08-31 NOTE — Telephone Encounter (Signed)
 Copied from CRM [id number]. Topic: General - Other >> Aug 30, 2023  4:10 PM Jon Gills C wrote: Reason for CRM: Patient called in wanting to speak to Nurse, is requesting someone to give him a callback >> Aug 30, 2023  4:21 PM Sim Boast F wrote: Patient says that [facility] denied him for surgery and would like to be referred to another surgeon

## 2023-09-01 NOTE — Telephone Encounter (Signed)
 Left voicemail for pt to call back.

## 2023-09-01 NOTE — Telephone Encounter (Signed)
 Copied from CRM [id number]. Topic: General - Other >> Aug 30, 2023  4:10 PM Jon Gills C wrote: Reason for CRM: Patient called in wanting to speak to Nurse, is requesting someone to give him a callback >> Sep 01, 2023 11:49 AM Fonda Kinder J wrote: Pt called in again to f/u, he states he never received a callback. I was advised by CAL the conversation was routed to his provider and awaiting response in which I relayed to the pt >> Aug 31, 2023  9:20 AM Sim Boast F wrote: Patient called to follow up on this, I let him know that Sarah's nurse is seeing patients and she will call when she gets a chance  >> Aug 30, 2023  4:21 PM Sim Boast F wrote: Patient says that [facility] denied him for surgery and would like to be referred to another surgeon

## 2023-09-04 ENCOUNTER — Telehealth (HOSPITAL_COMMUNITY): Payer: Self-pay

## 2023-09-04 ENCOUNTER — Ambulatory Visit (HOSPITAL_COMMUNITY): Admitting: Nurse Practitioner

## 2023-09-04 ENCOUNTER — Other Ambulatory Visit: Payer: Self-pay | Admitting: Nurse Practitioner

## 2023-09-04 ENCOUNTER — Other Ambulatory Visit (HOSPITAL_COMMUNITY): Payer: Self-pay

## 2023-09-04 DIAGNOSIS — E1169 Type 2 diabetes mellitus with other specified complication: Secondary | ICD-10-CM

## 2023-09-04 NOTE — Telephone Encounter (Signed)
 Called to confirm/remind patient of their appointment at the [facility] on 09/05/2023 8:15.   Appointment:   [x] Confirmed  [] Left mess   [] No answer/No voice mail  [] Phone not in service  Patient reminded to bring all medications and/or complete list.  Confirmed patient has transportation. Gave directions, instructed to utilize valet parking.

## 2023-09-04 NOTE — Progress Notes (Signed)
 [facility] Note   Referring Physician: PCP: [name], NP PCP-Cardiologist: Dr. Gasper Lloyd   Chief Complaint: post hospital f/u for a/c diastolic heart failure  HPI:  Luis [name] is a 63 y.o. 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   He has had multiple hospitalizations in the past 1 year.  Admitted in July with hypoxic/hypercarbic respiratory failure secondary to pneumonia/COPD and diastolic heart failure exacerbation requiring intubation and mechanical ventilation.  He was also delirious at that time due to alcohol withdrawal.  Echo with EF 50 to 55% and grade 2 diastolic dysfunction.  He was once again admitted in September 2024 with worsening dyspnea and admitted for respiratory failure secondary to COPD and HFpEF requiring IV steroids, antibiotics and diuresis.  Reported eating significant amounts of sodium at home.  Since that time he has been seen in [facility] clinic and referred to [facility], now followed by Dr. Gasper Lloyd.   He was recently admitted 2/25 for acute respiratory failure with hypoxia due to pneumonia community-acquired versus aspiration, COPD exacerbation and acute on chronic diastolic CHF. He was managed by internal medicine. Echo not repeated. Per hospital notes, he was aggressively diuresed, over 20 L, back to baseline. Pt had admitted to dietary indiscretion w/ sodium and not adhearing to fluid restriction at home prior to being admitted. Also admitted to drinking large amounts of alcohol. He was transitioned back to PO torsemide, 20 mg daily, + Farxiga. D/c wt 225 lb.  He presents today for post hospital f/u. Feels like he is retaining fluid. Wt up ~10 lb at home. Clinic wt is up 12  lb from d/c wt. Reports full med compliance but states that UOP is not robust on current regimen. Has been fluid restricting. Denies resting dyspnea. SOB w/ moderate level activities.    ECHO: 12/24/22: LVEF 50-55%    Past Medical History:  Diagnosis Date   Acute respiratory failure with hypoxia (HCC) 12/23/2022   Alcohol withdrawal syndrome, with delirium (HCC) 12/27/2022   COPD (chronic obstructive pulmonary disease) (HCC)    Diabetes mellitus without complication (HCC)    Diverticulitis    DVT (deep venous thrombosis) (HCC)    x3   Encephalopathy acute 12/28/2022   ETOH abuse    H/O colectomy    Hyperlipidemia    Hypertension    Sepsis associated hypotension (HCC) 08/03/2019   Smoker     Current Outpatient Medications  Medication Sig Dispense Refill   acetaminophen (ACETAMINOPHEN 8 HOUR) 650 MG CR tablet Take 1 tablet (650 mg total) by mouth every 8 (eight) hours as needed for pain (Patient taking differently: Take 1,300 mg by mouth every 8 (eight) hours as needed for pain or fever.) 90 tablet 1   amLODipine (NORVASC) 10 MG tablet Take 1 tablet (10 mg total) by mouth daily. 90 tablet 0   ascorbic acid (VITAMIN C) 500 MG tablet Take 1 tablet (500 mg total) by mouth daily. 30 tablet 1   atorvastatin (LIPITOR) 10 MG tablet Take 1 tablet (10 mg total) by mouth daily. 90 tablet 0   cetirizine (ZYRTEC) 10 MG tablet Take 1 tablet (10 mg total) by mouth daily. 90 tablet 1   dapagliflozin propanediol (FARXIGA) 10 MG TABS tablet Take 1 tablet (10 mg total) by mouth daily before breakfast. 90  tablet 2   fluticasone (FLOVENT HFA) 110 MCG/ACT inhaler Inhale 2 puffs into the lungs 2 (two) times daily. 1 each 2   folic acid (FOLVITE) 1 MG tablet Take 1 tablet (1 mg total) by mouth daily. 90 tablet 0   gabapentin (NEURONTIN) 300 MG capsule Take 1 capsule (300 mg total) by mouth 2 (two) times daily. (Patient taking differently: Take 600 mg by mouth at bedtime.) 90 capsule 3   hydrOXYzine  (VISTARIL) 50 MG capsule Take 1 capsule (50 mg total) by mouth at bedtime as needed. 30 capsule 0   ipratropium-albuterol (DUONEB) 0.5-2.5 (3) MG/3ML SOLN Take 3 mLs by nebulization every 6 (six) hours as needed.     isosorbide mononitrate (IMDUR) 30 MG 24 hr tablet Take 1 tablet (30 mg total) by mouth daily. 30 tablet 0   losartan (COZAAR) 25 MG tablet Take 1 tablet (25 mg total) by mouth daily. 30 tablet 5   metFORMIN (GLUCOPHAGE) 500 MG tablet Take 2 tablets (1,000 mg total) by mouth daily with breakfast AND 1 tablet (500 mg total) daily with supper. (Patient taking differently: Take 2 tablets (1,000 mg total) by mouth daily ) 90 tablet 3   metoprolol succinate (TOPROL-XL) 25 MG 24 hr tablet Take 1 tablet (25 mg total) by mouth daily. 90 tablet 3   Multiple Vitamin (MULTIVITAMIN WITH MINERALS) TABS tablet Take 1 tablet by mouth every other day.      nicotine (NICODERM CQ - DOSED IN MG/24 HOURS) 21 mg/24hr patch Place 1 patch (21 mg total) onto the skin daily. 28 patch 0   thiamine (VITAMIN B1) 100 MG tablet Take 1 tablet (100 mg total) by mouth daily. 90 tablet 0   torsemide (DEMADEX) 20 MG tablet Take 1 tablet (20 mg total) by mouth daily. 30 tablet 6   triamcinolone cream (KENALOG) 0.5 % Apply topically to legs twice daily for up to 2 weeks as needed for eczema 30 g 1   Zoster Vaccine Adjuvanted [facility] injection Inject into the muscle. 1 each 1   No current facility-administered medications for this visit.    Allergies  Allergen Reactions   Lisinopril     Other reaction(s): renal effects   Codeine Other (See Comments) and Hives    unknown Other reaction(s): Other (See Comments) unknown unknown      Social History   Socioeconomic History   Marital status: Single    Spouse name: Not on file   Number of children: Not on file   Years of education: Not on file   Highest education level: Not on file  Occupational History   Occupation: Pt states not able to work  Tobacco Use    Smoking status: Every Day    Current packs/day: 1.00    Average packs/day: 1 pack/day for 44.0 years (44.0 ttl pk-yrs)    Types: Cigarettes   Smokeless tobacco: Current  Vaping Use   Vaping status: Never Used  Substance and Sexual Activity   Alcohol use: Yes    Comment: 1 drink (1 pt) 5 days per week   Drug use: Yes    Types: Marijuana   Sexual activity: Not Currently  Other Topics Concern   Not on file  Social History Narrative   Not on file   Social Drivers of Health   Financial Resource Strain: Not on file  Food Insecurity: No Food Insecurity (08/07/2023)   Hunger Vital Sign    Worried About Running Out of Food in the Last Year: Never true  Ran Out of Food in the Last Year: Never true  Transportation Needs: Unmet Transportation Needs (08/07/2023)   PRAPARE - Administrator, Civil Service (Medical): Yes    Lack of Transportation (Non-Medical): No  Physical Activity: Not on file  Stress: Stress Concern Present (04/18/2023)   [employer] of Occupational Health - Occupational Stress Questionnaire    Feeling of Stress : To some extent  Social Connections: Not on file  Intimate Partner Violence: Not At Risk (08/07/2023)   Humiliation, Afraid, Rape, and Kick questionnaire    Fear of Current or Ex-Partner: No    Emotionally Abused: No    Physically Abused: No    Sexually Abused: No      Family History  Problem Relation Age of Onset   COPD Mother    Prostate cancer Father    Colon cancer Neg Hx    Rectal cancer Neg Hx    Stomach cancer Neg Hx    Esophageal cancer Neg Hx     There were no vitals filed for this visit.   PHYSICAL EXAM: General:  Well appearing, obese. No respiratory difficulty HEENT: normal Neck: supple. JVD 8 cm. Carotids 2+ bilat; no bruits. No lymphadenopathy or thyromegaly appreciated. Cor: PMI nondisplaced. Regular rate & rhythm. No rubs, gallops or murmurs. Lungs: clear Abdomen: distended + colostomy bag + stool.    Extremities: no cyanosis, clubbing, rash, trace b/l LE ankle edema Neuro: alert & oriented x 3, cranial nerves grossly intact. moves all 4 extremities w/o difficulty. Affect pleasant.  ECG: not performed    ASSESSMENT & PLAN:  Heart failure with preserved EF  - cMRI in 2022 EF 70%, RV nl. +LGE noted in the mid-wall basal anteroseptum and mid-wall basal inferolateral wall, noncoronary disease pattern  - Echo 7/24 EF 50-55% RV nl  - Recent admit for a/c CHF w/ volume overload in setting of dietary indiscretion  - Today NYHA Class II early III. Volume increasing post hospital, up 12 lb.  - Increase Torsemide to 40 mg daily  - Add Spironolactone 12.5 mg daily  - Continue Farxiga 10mg  daily - Continue Toprol XL 25 mg daily  - Continue losartan 25mg  daily.  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 No mediastinal lymphadenopathy on CTs to suggest pulmonary sarcoid. However, due to his continued excessive alcohol intake and multiple wounds (large abdominal wound) he would be high risk for immunosuppressive therapy with concerns regarding compliance.  - Check BMP + BNP today and repeat BMP again in 7 days  2. CHB s/p Leadless PPM - following with EP    3. T2DM - followed by PCP - on SGLT2i, ARB + statin    4. HTN -mildly elevated - GDMT per above - adding spiro 12.5 mg daily    5. Abdominal wound / hernia w/ hx of ileostomy - followed at [facility] risk for readmit. F/u w/ APP in 4 wks to reassess volume status.   Robbie Lis, PA-C 09/04/23

## 2023-09-04 NOTE — Telephone Encounter (Signed)
 Pt called and stated he wanted a referral ( surgery) to get his colostomy reverse and has a Hernia we wants surgery on. Want referral to [facility]

## 2023-09-05 ENCOUNTER — Other Ambulatory Visit: Payer: Self-pay

## 2023-09-05 ENCOUNTER — Encounter (HOSPITAL_COMMUNITY): Payer: Self-pay

## 2023-09-05 ENCOUNTER — Ambulatory Visit (HOSPITAL_COMMUNITY)
Admission: RE | Admit: 2023-09-05 | Discharge: 2023-09-05 | Disposition: A | Discharge status: Home / Self Care | Source: Ambulatory Visit | Attending: Cardiology | Admitting: Cardiology

## 2023-09-05 ENCOUNTER — Other Ambulatory Visit (HOSPITAL_COMMUNITY): Payer: Self-pay

## 2023-09-05 ENCOUNTER — Ambulatory Visit (HOSPITAL_COMMUNITY)
Admission: RE | Admit: 2023-09-05 | Discharge: 2023-09-05 | Discharge status: Home / Self Care | Source: Ambulatory Visit | Attending: *Deleted | Admitting: *Deleted

## 2023-09-05 ENCOUNTER — Other Ambulatory Visit: Payer: Self-pay | Admitting: Nurse Practitioner

## 2023-09-05 VITALS — BP 158/74 | HR 61 | Ht 73.0 in | Wt 237.0 lb

## 2023-09-05 DIAGNOSIS — L24B3 Irritant contact dermatitis related to fecal or urinary stoma or fistula: Secondary | ICD-10-CM | POA: Diagnosis not present

## 2023-09-05 DIAGNOSIS — K439 Ventral hernia without obstruction or gangrene: Secondary | ICD-10-CM | POA: Insufficient documentation

## 2023-09-05 DIAGNOSIS — L089 Local infection of the skin and subcutaneous tissue, unspecified: Secondary | ICD-10-CM

## 2023-09-05 DIAGNOSIS — F17210 Nicotine dependence, cigarettes, uncomplicated: Secondary | ICD-10-CM | POA: Diagnosis not present

## 2023-09-05 DIAGNOSIS — Z86718 Personal history of other venous thrombosis and embolism: Secondary | ICD-10-CM | POA: Diagnosis not present

## 2023-09-05 DIAGNOSIS — J449 Chronic obstructive pulmonary disease, unspecified: Secondary | ICD-10-CM | POA: Insufficient documentation

## 2023-09-05 DIAGNOSIS — F1090 Alcohol use, unspecified, uncomplicated: Secondary | ICD-10-CM | POA: Diagnosis not present

## 2023-09-05 DIAGNOSIS — I442 Atrioventricular block, complete: Secondary | ICD-10-CM | POA: Insufficient documentation

## 2023-09-05 DIAGNOSIS — Z7984 Long term (current) use of oral hypoglycemic drugs: Secondary | ICD-10-CM | POA: Insufficient documentation

## 2023-09-05 DIAGNOSIS — I5032 Chronic diastolic (congestive) heart failure: Secondary | ICD-10-CM

## 2023-09-05 DIAGNOSIS — T148XXA Other injury of unspecified body region, initial encounter: Secondary | ICD-10-CM

## 2023-09-05 DIAGNOSIS — Z79899 Other long term (current) drug therapy: Secondary | ICD-10-CM | POA: Diagnosis not present

## 2023-09-05 DIAGNOSIS — T8189XD Other complications of procedures, not elsewhere classified, subsequent encounter: Secondary | ICD-10-CM | POA: Diagnosis not present

## 2023-09-05 DIAGNOSIS — Z932 Ileostomy status: Secondary | ICD-10-CM

## 2023-09-05 DIAGNOSIS — Z432 Encounter for attention to ileostomy: Secondary | ICD-10-CM

## 2023-09-05 DIAGNOSIS — Z950 Presence of cardiac pacemaker: Secondary | ICD-10-CM | POA: Insufficient documentation

## 2023-09-05 DIAGNOSIS — E119 Type 2 diabetes mellitus without complications: Secondary | ICD-10-CM | POA: Diagnosis not present

## 2023-09-05 DIAGNOSIS — I110 Hypertensive heart disease with heart failure: Secondary | ICD-10-CM | POA: Insufficient documentation

## 2023-09-05 DIAGNOSIS — E785 Hyperlipidemia, unspecified: Secondary | ICD-10-CM | POA: Insufficient documentation

## 2023-09-05 DIAGNOSIS — Z7689 Persons encountering health services in other specified circumstances: Secondary | ICD-10-CM | POA: Diagnosis not present

## 2023-09-05 DIAGNOSIS — K432 Incisional hernia without obstruction or gangrene: Secondary | ICD-10-CM

## 2023-09-05 LAB — BASIC METABOLIC PANEL
Anion gap: 10 (ref 5–15)
BUN: 16 mg/dL (ref 8–23)
CO2: 29 mmol/L (ref 22–32)
Calcium: 9.8 mg/dL (ref 8.9–10.3)
Chloride: 101 mmol/L (ref 98–111)
Creatinine, Ser: 1.19 mg/dL (ref 0.61–1.24)
GFR, Estimated: >60 mL/min (ref >60)
Glucose, Bld: 153 mg/dL — ABNORMAL HIGH (ref 70–99)
Potassium: 4.6 mmol/L (ref 3.5–5.1)
Sodium: 140 mmol/L (ref 135–145)

## 2023-09-05 LAB — BRAIN NATRIURETIC PEPTIDE: B Natriuretic Peptide: 204.8 pg/mL — ABNORMAL HIGH (ref 0.0–100.0)

## 2023-09-05 MED ORDER — SPIRONOLACTONE 25 MG PO TABS
12.5000 mg | ORAL_TABLET | Freq: Every day | ORAL | 3 refills | Status: DC
  Filled 2023-09-05: qty 45, 90d supply, fill #0

## 2023-09-05 MED ORDER — ATORVASTATIN CALCIUM 10 MG PO TABS
10.0000 mg | ORAL_TABLET | Freq: Every day | ORAL | 1 refills | Status: DC
  Filled 2023-09-05: qty 90, 90d supply, fill #0
  Filled 2023-11-28 (×2): qty 90, 90d supply, fill #1

## 2023-09-05 MED ORDER — TORSEMIDE 20 MG PO TABS
40.0000 mg | ORAL_TABLET | Freq: Every day | ORAL | 3 refills | Status: DC
  Filled 2023-09-05 – 2023-09-14 (×3): qty 60, 30d supply, fill #0

## 2023-09-05 NOTE — Patient Instructions (Signed)
 Medication Changes:  INCREASE TORSEMIDE TO 40MG  DAILY   START: SPIRONOLACTONE 12.5MG  ONCE DAILY   Lab Work:  Labs done today, your results will be available in MyChart, we will contact you for abnormal readings.  Follow-Up in: 1 MONTH AS SCHEDULED   At the [facility], you and your health needs are our priority. We have a designated team specialized in the treatment of Heart Failure. This Care Team includes your primary Heart Failure Specialized Cardiologist (physician), Advanced Practice Providers (APPs- Physician Assistants and Nurse Practitioners), and Pharmacist who all work together to provide you with the care you need, when you need it.   You may see any of the following providers on your designated Care Team at your next follow up:  Dr. Arvilla Meres Dr. Marca Ancona Dr. Dorthula Nettles Dr. Theresia Bough Tonye Becket, NP Robbie Lis, [place] [facility] [place], [place] Brynda Peon, NP [place] Lee, NP Karle Plumber, PharmD   Please be sure to bring in all your medications bottles to every appointment.   Need to Contact us:  If you have any questions or concerns before your next appointment please send [place] a message through [place] or call our office at [phone].    TO LEAVE A MESSAGE FOR THE NURSE SELECT OPTION 2, PLEASE LEAVE A MESSAGE INCLUDING: YOUR NAME DATE OF BIRTH CALL BACK NUMBER REASON FOR CALL**this is important as we prioritize the call backs  YOU WILL RECEIVE A CALL BACK THE SAME DAY AS LONG AS YOU CALL BEFORE 4:00 PM

## 2023-09-05 NOTE — Progress Notes (Signed)
 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 HPI:  Diverticulitis with colectomy and end ileostomy Past Medical History:  Diagnosis Date   Acute respiratory failure with hypoxia (HCC) 12/23/2022   Alcohol withdrawal syndrome, with delirium (HCC) 12/27/2022   COPD (chronic obstructive pulmonary disease) (HCC)    Diabetes mellitus without complication (HCC)    Diverticulitis    DVT (deep venous thrombosis) (HCC)    x3   Encephalopathy acute 12/28/2022   ETOH abuse    H/O colectomy    Hyperlipidemia    Hypertension    Sepsis associated hypotension (HCC) 08/03/2019   Smoker    Family History  Problem Relation Age of Onset   COPD Mother    Prostate cancer Father    Colon cancer Neg Hx    Rectal cancer Neg Hx    Stomach cancer Neg Hx    Esophageal cancer Neg Hx    Allergies  Allergen Reactions   Lisinopril     Other reaction(s): renal effects   Codeine Other (See Comments) and Hives    unknown Other reaction(s): Other (See Comments) unknown unknown   Current Outpatient Medications  Medication Sig Dispense Refill Last Dose/Taking   acetaminophen (ACETAMINOPHEN 8 HOUR) 650 MG CR tablet Take 1 tablet (650 mg total) by mouth every 8 (eight) hours as needed for pain (Patient taking differently: Take 1,300 mg by mouth every 8 (eight) hours as needed for pain or fever.) 90 tablet 1    amLODipine (NORVASC) 10 MG tablet Take 1 tablet (10 mg total) by mouth daily. 90 tablet 0    ascorbic acid (VITAMIN C) 500 MG tablet Take 1 tablet (500 mg total) by mouth daily. 30 tablet 1    atorvastatin (LIPITOR) 10 MG tablet Take 1 tablet (10 mg total) by mouth daily. 90 tablet 1    cetirizine (ZYRTEC) 10 MG tablet Take 1 tablet (10 mg total) by mouth daily. 90 tablet 1    dapagliflozin propanediol (FARXIGA) 10 MG TABS tablet Take 1 tablet (10 mg total) by mouth daily before breakfast. 90 tablet 2    fluticasone (FLOVENT HFA) 110  MCG/ACT inhaler Inhale 2 puffs into the lungs 2 (two) times daily. 1 each 2    folic acid (FOLVITE) 1 MG tablet Take 1 tablet (1 mg total) by mouth daily. 90 tablet 0    gabapentin (NEURONTIN) 300 MG capsule Take 1 capsule (300 mg total) by mouth 2 (two) times daily. (Patient taking differently: Take 600 mg by mouth at bedtime.) 90 capsule 3    hydrOXYzine (VISTARIL) 50 MG capsule Take 1 capsule (50 mg total) by mouth at bedtime as needed. 30 capsule 0    ipratropium-albuterol (DUONEB) 0.5-2.5 (3) MG/3ML SOLN Take 3 mLs by nebulization every 6 (six) hours as needed.      isosorbide mononitrate (IMDUR) 30 MG 24 hr tablet Take 1 tablet (30 mg total) by mouth daily. 30 tablet 0    losartan (COZAAR) 25 MG tablet Take 1 tablet (25 mg total) by mouth daily. 30 tablet 5    metFORMIN (GLUCOPHAGE) 500 MG tablet Take 2 tablets (1,000 mg total) by mouth daily with breakfast AND 1 tablet (500 mg total) daily with supper. (Patient taking differently: Take 2 tablets (1,000 mg total) by mouth daily ) 90 tablet 3    metoprolol succinate (TOPROL-XL) 25 MG 24 hr tablet Take 1 tablet (25 mg total) by mouth daily. 90 tablet 3  Multiple Vitamin (MULTIVITAMIN WITH MINERALS) TABS tablet Take 1 tablet by mouth every other day.       nicotine (NICODERM CQ - DOSED IN MG/24 HOURS) 21 mg/24hr patch Place 1 patch (21 mg total) onto the skin daily. 28 patch 0    spironolactone (ALDACTONE) 25 MG tablet Take 0.5 tablets (12.5 mg total) by mouth daily. 45 tablet 3    thiamine (VITAMIN B1) 100 MG tablet Take 1 tablet (100 mg total) by mouth daily. 90 tablet 0    torsemide (DEMADEX) 20 MG tablet Take 2 tablets (40 mg total) by mouth daily. 60 tablet 3    triamcinolone cream (KENALOG) 0.5 % Apply topically to legs twice daily for up to 2 weeks as needed for eczema 30 g 1    Zoster Vaccine Adjuvanted [facility]) injection Inject into the muscle. 1 each 1    No current facility-administered medications for this encounter.   ROS   Review of Systems  Constitutional:  Positive for fatigue (shortness of breath with exertion).  Respiratory:  Positive for shortness of breath.   Cardiovascular:        Pacemaker  Gastrointestinal:        Ventral hernia ileostomy  Skin:  Positive for color change, rash and wound (nonhealing midline abdominal wounds).  Allergic/Immunologic: Positive for environmental allergies.  Psychiatric/Behavioral:  Positive for agitation (upset over surgical consult).   All other systems reviewed and are negative.  Vital signs:  BP (P) 137/79 (BP Location: Right Arm)   Pulse (P) 63   Temp (P) 98.4 F (36.9 C) (Oral)   Resp (P) 18   SpO2 (P) 93%  Exam:  Physical Exam Vitals reviewed.  Constitutional:      Appearance: Normal appearance.  Cardiovascular:     Rate and Rhythm: Normal rate and regular rhythm.     Heart sounds: Normal heart sounds.  Pulmonary:     Comments: Oxygen saturation 93% after walking to office.  Is 96% by end of visit Abdominal:     Hernia: A hernia is present.  Skin:    Findings: Erythema and rash present.  Neurological:     General: No focal deficit present.     Mental Status: He is alert.     Sensory: Sensory deficit present.     Comments: Neuropathy in feet  Psychiatric:        Thought Content: Thought content normal.        Judgment: Judgment normal.     Stoma type/location:  RLQ ileostomy, flush Stomal assessment/size:  1" flush pink and moist  Peristomal assessment:  erythema and irritation Midline abdominal wound with nonintact lesions.  Wound cleansed with VASHE and aquacel ABD pads and tape.  Treatment options for stomal/peristomal skin: 1 piece flat and barrier ring  Output: liquid tan stool Ostomy pouching: 1pc. Flat with barrier ring Education provided:  patient met with surgery team who did not feel he was an ideal surgical candidate due to comorbidities and lifestyle of smoking and alcohol use.  He may try a different practice.       Impression/dx  Contact dermatitis Nonhealing wound Ileostomy Ventral hernia Discussion  Discussed importance of smoking cessation and ETOH use Plan  See back as needed    Visit time: 45 minutes.   Luis Gip FNP-BC

## 2023-09-06 ENCOUNTER — Other Ambulatory Visit (HOSPITAL_COMMUNITY): Payer: Self-pay

## 2023-09-06 ENCOUNTER — Telehealth (HOSPITAL_COMMUNITY): Payer: Self-pay

## 2023-09-06 DIAGNOSIS — I5032 Chronic diastolic (congestive) heart failure: Secondary | ICD-10-CM

## 2023-09-06 NOTE — Telephone Encounter (Signed)
 Patient's labs has been placed and appointment scheduled.  Pt aware, agreeable, and verbalized understanding

## 2023-09-06 NOTE — Telephone Encounter (Signed)
-----   Message from [place] sent at 09/05/2023 11:47 AM EDT ----- BNP elevated. SCr and K ok. Diuretics were increased appropriately today. Needs F/u BMP in 7 days given we added spironolactone

## 2023-09-11 ENCOUNTER — Encounter: Payer: Self-pay | Admitting: Nurse Practitioner

## 2023-09-11 NOTE — Discharge Instructions (Signed)
 NO change in wound care Ostomy pouching unchanged

## 2023-09-12 ENCOUNTER — Ambulatory Visit (HOSPITAL_COMMUNITY)
Admission: RE | Admit: 2023-09-12 | Discharge: 2023-09-12 | Disposition: A | Discharge status: Home / Self Care | Source: Ambulatory Visit | Attending: Nurse Practitioner | Admitting: Nurse Practitioner

## 2023-09-12 ENCOUNTER — Ambulatory Visit: Payer: Self-pay

## 2023-09-12 DIAGNOSIS — Z432 Encounter for attention to ileostomy: Secondary | ICD-10-CM | POA: Diagnosis not present

## 2023-09-12 DIAGNOSIS — Z7689 Persons encountering health services in other specified circumstances: Secondary | ICD-10-CM | POA: Diagnosis not present

## 2023-09-12 DIAGNOSIS — L24B3 Irritant contact dermatitis related to fecal or urinary stoma or fistula: Secondary | ICD-10-CM | POA: Insufficient documentation

## 2023-09-12 DIAGNOSIS — T148XXA Other injury of unspecified body region, initial encounter: Secondary | ICD-10-CM

## 2023-09-12 DIAGNOSIS — T8189XD Other complications of procedures, not elsewhere classified, subsequent encounter: Secondary | ICD-10-CM

## 2023-09-12 NOTE — Patient Instructions (Signed)
 Visit Information  Thank you for taking time to visit with me today. Please don't hesitate to contact me if I can be of assistance to you.   Following are the goals we discussed today:  Continue to take medications as prescribed Continue to attend provider visits as scheduled Continue to eat healthy, lean meats, vegetables, fruits, avoid saturated and transfats Contact provider with health questions or concerns as needed   Our next appointment is by telephone on 09/25/23 at 11:00 am  Please call the care guide team at [phone] if you need to cancel or reschedule your appointment.   If you are experiencing a Mental Health or Behavioral Health Crisis or need someone to talk to, please call the Suicide and Crisis Lifeline: 988 call the [place] National Suicide Prevention Lifeline: 531-403-9050 or TTY: 5317517596 TTY 763-284-1388) to talk to a trained counselor   Kathyrn Sheriff, RN, MSN, BSN, CCM [place]  [facility], Population Health Case Manager Phone: [phone]

## 2023-09-12 NOTE — Progress Notes (Signed)
 [facility]   Reason for visit:  RLQ ileostomy Midline surgical wound, nonhealing Ventral hernia  HPI:  Perforated diverticulum with end ileostomy Past Medical History:  Diagnosis Date   Acute respiratory failure with hypoxia (HCC) 12/23/2022   Alcohol withdrawal syndrome, with delirium (HCC) 12/27/2022   COPD (chronic obstructive pulmonary disease) (HCC)    Diabetes mellitus without complication (HCC)    Diverticulitis    DVT (deep venous thrombosis) (HCC)    x3   Encephalopathy acute 12/28/2022   ETOH abuse    H/O colectomy    Hyperlipidemia    Hypertension    Sepsis associated hypotension (HCC) 08/03/2019   Smoker    Family History  Problem Relation Age of Onset   COPD Mother    Prostate cancer Father    Colon cancer Neg Hx    Rectal cancer Neg Hx    Stomach cancer Neg Hx    Esophageal cancer Neg Hx    Allergies  Allergen Reactions   Lisinopril     Other reaction(s): renal effects   Codeine Other (See Comments) and Hives    unknown Other reaction(s): Other (See Comments) unknown unknown   Current Outpatient Medications  Medication Sig Dispense Refill Last Dose/Taking   acetaminophen (ACETAMINOPHEN 8 HOUR) 650 MG CR tablet Take 1 tablet (650 mg total) by mouth every 8 (eight) hours as needed for pain (Patient taking differently: Take 1,300 mg by mouth every 8 (eight) hours as needed for pain or fever.) 90 tablet 1    amLODipine (NORVASC) 10 MG tablet Take 1 tablet (10 mg total) by mouth daily. 90 tablet 0    ascorbic acid (VITAMIN C) 500 MG tablet Take 1 tablet (500 mg total) by mouth daily. (Patient not taking: Reported on 09/12/2023) 30 tablet 1    atorvastatin (LIPITOR) 10 MG tablet Take 1 tablet (10 mg total) by mouth daily. 90 tablet 1    cetirizine (ZYRTEC) 10 MG tablet Take 1 tablet (10 mg total) by mouth daily. 90 tablet 1    dapagliflozin propanediol (FARXIGA) 10 MG TABS tablet Take 1 tablet (10 mg total) by mouth daily before breakfast. 90  tablet 2    dextromethorphan-guaiFENesin (MUCINEX DM) 30-600 MG 12hr tablet Take 1 tablet by mouth 2 (two) times daily.      fluticasone (FLOVENT HFA) 110 MCG/ACT inhaler Inhale 2 puffs into the lungs 2 (two) times daily. 1 each 2    folic acid (FOLVITE) 1 MG tablet Take 1 tablet (1 mg total) by mouth daily. 90 tablet 0    gabapentin (NEURONTIN) 300 MG capsule Take 1 capsule (300 mg total) by mouth 2 (two) times daily. (Patient taking differently: Take 600 mg by mouth at bedtime.) 90 capsule 3    hydrOXYzine (VISTARIL) 50 MG capsule Take 1 capsule (50 mg total) by mouth at bedtime as needed. 30 capsule 0    ipratropium-albuterol (DUONEB) 0.5-2.5 (3) MG/3ML SOLN Take 3 mLs by nebulization every 6 (six) hours as needed.      isosorbide mononitrate (IMDUR) 30 MG 24 hr tablet Take 1 tablet (30 mg total) by mouth daily. 30 tablet 0    losartan (COZAAR) 25 MG tablet Take 1 tablet (25 mg total) by mouth daily. 30 tablet 5    metFORMIN (GLUCOPHAGE) 500 MG tablet Take 2 tablets (1,000 mg total) by mouth daily with breakfast AND 1 tablet (500 mg total) daily with supper. (Patient taking differently: Take 2 tablets (1,000 mg total) by mouth daily ) 90  tablet 3    metoprolol succinate (TOPROL-XL) 25 MG 24 hr tablet Take 1 tablet (25 mg total) by mouth daily. 90 tablet 3    Multiple Vitamin (MULTIVITAMIN WITH MINERALS) TABS tablet Take 1 tablet by mouth every other day.       nicotine (NICODERM CQ - DOSED IN MG/24 HOURS) 21 mg/24hr patch Place 1 patch (21 mg total) onto the skin daily. 28 patch 0    spironolactone (ALDACTONE) 25 MG tablet Take 0.5 tablets (12.5 mg total) by mouth daily. 45 tablet 3    thiamine (VITAMIN B1) 100 MG tablet Take 1 tablet (100 mg total) by mouth daily. 90 tablet 0    torsemide (DEMADEX) 20 MG tablet Take 2 tablets (40 mg total) by mouth daily. 60 tablet 3    triamcinolone cream (KENALOG) 0.5 % Apply topically to legs twice daily for up to 2 weeks as needed for eczema 30 g 1     Zoster Vaccine Adjuvanted [facility]) injection Inject into the muscle. 1 each 1    No current facility-administered medications for this encounter.   ROS  Review of Systems  Constitutional:  Positive for fatigue and unexpected weight change.       Fluid accumulation causing weight gain, increased shortness of breath  Respiratory:  Positive for shortness of breath.        Smoker  Cardiovascular:        Pacemaker Shortness of breath with exertion  Gastrointestinal:        Ventral hernia RLQ Ileostomy  Musculoskeletal:  Positive for myalgias.  Skin:  Positive for color change and rash.       Breakdown around stoma, from tape on abdomen/surgical wound and to abdominal pannus  cleansed with VASHE.   Psychiatric/Behavioral:  Positive for dysphoric mood.        Disappointed regarding reversal obstacles.   All other systems reviewed and are negative.  Vital signs:  BP 134/81 (BP Location: Right Arm)   Pulse 69   Temp 98.9 F (37.2 C) (Oral)   Resp 18   SpO2 93%  Exam:  Physical Exam Vitals reviewed.  Constitutional:      Appearance: He is obese.  Cardiovascular:     Rate and Rhythm: Normal rate and regular rhythm.     Heart sounds: Normal heart sounds.  Pulmonary:     Breath sounds: Wheezing present.  Abdominal:     Hernia: A hernia is present.  Musculoskeletal:     Comments: Uses wheelchair in hospital   Skin:    Findings: Erythema and rash present.  Neurological:     Mental Status: He is alert.  Psychiatric:        Behavior: Behavior normal.     Stoma type/location:  RLQ ileostomy Stomal assessment/size:  1" flush, parastomal hernia  Peristomal assessment:  contact dermatitis to peristomal skin  Treatment options for stomal/peristomal skin: barrier ring and 1 piece flat  Output: liquid yellow stool Ostomy pouching: 1pc. flat Education provided:  pouch change performed  Wound care, wash with VASHE to periwound skin and wounds, aquacel to open wounds, cover with  gauze and ABD pads/tape.  Changed twice weekly. Met with surgeon who was not optimistic about reversal at this time.  Cardiac health and lifestyle choices such as smoking and ETOH use were factors    Impression/dx  Irritant contact dermatitis Ileostomy Medical adhesive related skin injury Nonhealing wound Discussion  Plans to meet again with [facility] team to discuss surgical options COntinue same plan  of care Desitin to abdominal pannus when irritated (moisture associated skin damage)   Plan  See back as needed    Visit time: 40 minutes.   Mike Gip FNP-BC

## 2023-09-12 NOTE — Patient Outreach (Signed)
 Care Coordination   Initial Visit Note   09/12/2023 Name: Luis [name] MRN: [id number] DOB: 1960-12-14  Luis [name] is a 63 y.o. year old male who sees [name], NP for primary care. I spoke with  [name] by phone today.  What matters to the patients health and wellness today?  Luis [name] reports continued concerns regarding fluid volume. Last office visit with advance [facility] clinic on 09/05/23. Patient reports he has been in contact with HF clinic and will follow up tomorrow with them. Luis [name] also reports he could benefit from in home care assistance.  Goals Addressed             This Visit's Progress    Health Management       Interventions Today    Flowsheet Row Most Recent Value  Chronic Disease   Chronic disease during today's visit Congestive Heart Failure (CHF), Diabetes, Other, Hypertension (HTN)  [RLQ ileostomy with non healing wound.]  General Interventions   General Interventions Discussed/Reviewed General Interventions Discussed, Doctor Visits  [Evaluation of current treatment plan for health condition and patient's adherence to plan.]  Doctor Visits Discussed/Reviewed PCP, Specialist, Doctor Visits Discussed  [reviewed upcoming appointments]  PCP/Specialist Visits Compliance with follow-up visit  [reviewed upcoming follow up appointments]  Education Interventions   Education Provided Provided Education  Provided Verbal Education On Mental Health/Coping with Illness, Nutrition, Medication, When to see the doctor  Nutrition Interventions   Nutrition Discussed/Reviewed Nutrition Discussed  Pharmacy Interventions   Pharmacy Dicussed/Reviewed Pharmacy Topics Discussed, Referral to Pharmacist  [medications reviewed]  Referral to Pharmacist Drug interaction/side effects  [patient with questions regarding interaction and would like recommendations on decreasing the number of pills]            SDOH assessments and interventions completed:  Yes  SDOH  Interventions Today    Flowsheet Row Most Recent Value  SDOH Interventions   Food Insecurity Interventions Intervention Not Indicated  [patient reports has Medicaid transportation]  Housing Interventions Intervention Not Indicated  Transportation Interventions Intervention Not Indicated  [has medicaid transportation.]  Utilities Interventions Intervention Not Indicated     Care Coordination Interventions:  Yes, provided   Follow up plan: Follow up call scheduled for 09/25/23    Encounter Outcome:  Patient Visit Completed   Kathyrn Sheriff, RN, MSN, BSN, CCM [place]  [facility], Population Health Case Manager Phone: [phone]

## 2023-09-13 ENCOUNTER — Other Ambulatory Visit (HOSPITAL_COMMUNITY): Payer: Self-pay

## 2023-09-13 ENCOUNTER — Other Ambulatory Visit: Payer: Self-pay

## 2023-09-13 ENCOUNTER — Ambulatory Visit (HOSPITAL_COMMUNITY)
Admission: RE | Admit: 2023-09-13 | Discharge: 2023-09-13 | Disposition: A | Discharge status: Home / Self Care | Source: Ambulatory Visit | Attending: Cardiology | Admitting: Cardiology

## 2023-09-13 DIAGNOSIS — I5032 Chronic diastolic (congestive) heart failure: Secondary | ICD-10-CM | POA: Insufficient documentation

## 2023-09-13 DIAGNOSIS — Z7689 Persons encountering health services in other specified circumstances: Secondary | ICD-10-CM | POA: Diagnosis not present

## 2023-09-13 LAB — BASIC METABOLIC PANEL WITH GFR
Anion gap: 11 (ref 5–15)
BUN: 19 mg/dL (ref 8–23)
CO2: 27 mmol/L (ref 22–32)
Calcium: 10.8 mg/dL — ABNORMAL HIGH (ref 8.9–10.3)
Chloride: 96 mmol/L — ABNORMAL LOW (ref 98–111)
Creatinine, Ser: 1.18 mg/dL (ref 0.61–1.24)
GFR, Estimated: >60 mL/min (ref >60)
Glucose, Bld: 167 mg/dL — ABNORMAL HIGH (ref 70–99)
Potassium: 4.8 mmol/L (ref 3.5–5.1)
Sodium: 134 mmol/L — ABNORMAL LOW (ref 135–145)

## 2023-09-14 ENCOUNTER — Other Ambulatory Visit (HOSPITAL_COMMUNITY): Payer: Self-pay

## 2023-09-14 ENCOUNTER — Other Ambulatory Visit: Payer: Self-pay

## 2023-09-16 ENCOUNTER — Other Ambulatory Visit (HOSPITAL_COMMUNITY): Payer: Self-pay

## 2023-09-19 ENCOUNTER — Ambulatory Visit (HOSPITAL_COMMUNITY)
Admission: RE | Admit: 2023-09-19 | Discharge: 2023-09-19 | Disposition: A | Discharge status: Home / Self Care | Source: Ambulatory Visit | Attending: Nurse Practitioner | Admitting: Nurse Practitioner

## 2023-09-19 DIAGNOSIS — K439 Ventral hernia without obstruction or gangrene: Secondary | ICD-10-CM | POA: Diagnosis not present

## 2023-09-19 DIAGNOSIS — Z432 Encounter for attention to ileostomy: Secondary | ICD-10-CM

## 2023-09-19 DIAGNOSIS — Z932 Ileostomy status: Secondary | ICD-10-CM | POA: Diagnosis not present

## 2023-09-19 DIAGNOSIS — T8189XA Other complications of procedures, not elsewhere classified, initial encounter: Secondary | ICD-10-CM | POA: Insufficient documentation

## 2023-09-19 DIAGNOSIS — Z7689 Persons encountering health services in other specified circumstances: Secondary | ICD-10-CM | POA: Diagnosis not present

## 2023-09-19 DIAGNOSIS — L24B3 Irritant contact dermatitis related to fecal or urinary stoma or fistula: Secondary | ICD-10-CM

## 2023-09-19 NOTE — Progress Notes (Signed)
 [facility]   Reason for visit:  RLQ ileostomy Nonhealing surgical wound Ventral hernia HPI:  Perforated diverticulum with end ileostomy Past Medical History:  Diagnosis Date   Acute respiratory failure with hypoxia (HCC) 12/23/2022   Alcohol withdrawal syndrome, with delirium (HCC) 12/27/2022   COPD (chronic obstructive pulmonary disease) (HCC)    Diabetes mellitus without complication (HCC)    Diverticulitis    DVT (deep venous thrombosis) (HCC)    x3   Encephalopathy acute 12/28/2022   ETOH abuse    H/O colectomy    Hyperlipidemia    Hypertension    Sepsis associated hypotension (HCC) 08/03/2019   Smoker    Family History  Problem Relation Age of Onset   COPD Mother    Prostate cancer Father    Colon cancer Neg Hx    Rectal cancer Neg Hx    Stomach cancer Neg Hx    Esophageal cancer Neg Hx    Allergies  Allergen Reactions   Lisinopril     Other reaction(s): renal effects   Codeine Other (See Comments) and Hives    unknown Other reaction(s): Other (See Comments) unknown unknown   Current Outpatient Medications  Medication Sig Dispense Refill Last Dose/Taking   acetaminophen (ACETAMINOPHEN 8 HOUR) 650 MG CR tablet Take 1 tablet (650 mg total) by mouth every 8 (eight) hours as needed for pain (Patient taking differently: Take 1,300 mg by mouth every 8 (eight) hours as needed for pain or fever.) 90 tablet 1    amLODipine (NORVASC) 10 MG tablet Take 1 tablet (10 mg total) by mouth daily. 90 tablet 0    ascorbic acid (VITAMIN C) 500 MG tablet Take 1 tablet (500 mg total) by mouth daily. (Patient not taking: Reported on 09/12/2023) 30 tablet 1    atorvastatin (LIPITOR) 10 MG tablet Take 1 tablet (10 mg total) by mouth daily. 90 tablet 1    cetirizine (ZYRTEC) 10 MG tablet Take 1 tablet (10 mg total) by mouth daily. 90 tablet 1    dapagliflozin propanediol (FARXIGA) 10 MG TABS tablet Take 1 tablet (10 mg total) by mouth daily before breakfast. 90 tablet 2     dextromethorphan-guaiFENesin (MUCINEX DM) 30-600 MG 12hr tablet Take 1 tablet by mouth 2 (two) times daily.      fluticasone (FLOVENT HFA) 110 MCG/ACT inhaler Inhale 2 puffs into the lungs 2 (two) times daily. 1 each 2    folic acid (FOLVITE) 1 MG tablet Take 1 tablet (1 mg total) by mouth daily. 90 tablet 0    gabapentin (NEURONTIN) 300 MG capsule Take 1 capsule (300 mg total) by mouth 2 (two) times daily. (Patient taking differently: Take 600 mg by mouth at bedtime.) 90 capsule 3    hydrOXYzine (VISTARIL) 50 MG capsule Take 1 capsule (50 mg total) by mouth at bedtime as needed. 30 capsule 0    ipratropium-albuterol (DUONEB) 0.5-2.5 (3) MG/3ML SOLN Take 3 mLs by nebulization every 6 (six) hours as needed.      isosorbide mononitrate (IMDUR) 30 MG 24 hr tablet Take 1 tablet (30 mg total) by mouth daily. 30 tablet 0    losartan (COZAAR) 25 MG tablet Take 1 tablet (25 mg total) by mouth daily. 30 tablet 5    metFORMIN (GLUCOPHAGE) 500 MG tablet Take 2 tablets (1,000 mg total) by mouth daily with breakfast AND 1 tablet (500 mg total) daily with supper. (Patient taking differently: Take 2 tablets (1,000 mg total) by mouth daily ) 90 tablet 3  metoprolol succinate (TOPROL-XL) 25 MG 24 hr tablet Take 1 tablet (25 mg total) by mouth daily. 90 tablet 3    Multiple Vitamin (MULTIVITAMIN WITH MINERALS) TABS tablet Take 1 tablet by mouth every other day.       nicotine (NICODERM CQ - DOSED IN MG/24 HOURS) 21 mg/24hr patch Place 1 patch (21 mg total) onto the skin daily. 28 patch 0    spironolactone (ALDACTONE) 25 MG tablet Take 0.5 tablets (12.5 mg total) by mouth daily. 45 tablet 3    thiamine (VITAMIN B1) 100 MG tablet Take 1 tablet (100 mg total) by mouth daily. 90 tablet 0    torsemide (DEMADEX) 20 MG tablet Take 2 tablets (40 mg total) by mouth daily. 60 tablet 3    triamcinolone cream (KENALOG) 0.5 % Apply topically to legs twice daily for up to 2 weeks as needed for eczema 30 g 1    Zoster Vaccine  Adjuvanted [facility]) injection Inject into the muscle. 1 each 1    No current facility-administered medications for this encounter.   ROS  Review of Systems  Constitutional:  Positive for fatigue.  HENT:  Positive for sinus pressure.   Respiratory:  Positive for shortness of breath.   Cardiovascular:  Positive for leg swelling.       Shortness of breath with exertion  Gastrointestinal:  Positive for rectal pain.       Ventral hernia ileostomy  Musculoskeletal:  Positive for gait problem.  Skin:  Positive for color change and rash.  Allergic/Immunologic: Positive for environmental allergies.  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   All other systems reviewed and are negative.  Vital signs:  BP 125/67   Pulse 64   Temp 98.2 F (36.8 C) (Oral)   Resp 18   SpO2 93%  Exam:  Physical Exam Vitals reviewed.  Constitutional:      Appearance: He is obese.  HENT:     Mouth/Throat:     Mouth: Mucous membranes are moist.  Cardiovascular:     Rate and Rhythm: Normal rate and regular rhythm.     Pulses: Normal pulses.  Pulmonary:     Breath sounds: Normal breath sounds.  Abdominal:     Hernia: A hernia is present.  Musculoskeletal:        General: Normal range of motion.  Skin:    General: Skin is warm and dry.     Findings: Erythema, lesion and rash present.  Neurological:     General: No focal deficit present.     Mental Status: He is alert and oriented to person, place, and time. Mental status is at baseline.  Psychiatric:        Mood and Affect: Mood normal.        Behavior: Behavior normal.     Stoma type/location:  RLQ ileostomy Stomal assessment/size:  1" recessed Peristomal assessment:  denuded skin  ventral hernia with nonhealing abdominal wound Treatment options for stomal/peristomal skin: barrier ring and 1 piece flat pouch Output: liquid brown stool Ostomy pouching: 1pc. With barrier ring Education  provided:  washed abdominal wounds with VASHE and applied aquacel AG./  Dry dressing/tape.  Ileostomy with 1piece pouch     Impression/dx  Irritant contact dermatitis Ileostomy Ventral hernia Wound care Discussion  See back as needed.  Plan  Awaiting consult with another surgeon regarding reversal.     Visit time: 45 minutes.   Branda Cain FNP-BC

## 2023-09-20 ENCOUNTER — Other Ambulatory Visit (HOSPITAL_COMMUNITY): Payer: Self-pay

## 2023-09-20 ENCOUNTER — Ambulatory Visit (INDEPENDENT_AMBULATORY_CARE_PROVIDER_SITE_OTHER): Payer: Medicaid Other | Admitting: Podiatry

## 2023-09-20 DIAGNOSIS — Z91199 Patient's noncompliance with other medical treatment and regimen due to unspecified reason: Secondary | ICD-10-CM

## 2023-09-20 NOTE — Discharge Instructions (Signed)
 No changes

## 2023-09-20 NOTE — Progress Notes (Signed)
 No show

## 2023-09-21 DIAGNOSIS — K469 Unspecified abdominal hernia without obstruction or gangrene: Secondary | ICD-10-CM | POA: Diagnosis not present

## 2023-09-21 DIAGNOSIS — T8189XA Other complications of procedures, not elsewhere classified, initial encounter: Secondary | ICD-10-CM | POA: Diagnosis not present

## 2023-09-21 NOTE — Addendum Note (Signed)
 Addended by: [name] on: 09/21/2023 05:28 PM   Modules accepted: Orders

## 2023-09-22 ENCOUNTER — Other Ambulatory Visit: Payer: Self-pay | Admitting: Nurse Practitioner

## 2023-09-22 ENCOUNTER — Other Ambulatory Visit (HOSPITAL_COMMUNITY): Payer: Self-pay

## 2023-09-22 DIAGNOSIS — G4700 Insomnia, unspecified: Secondary | ICD-10-CM

## 2023-09-22 MED ORDER — HYDROXYZINE PAMOATE 50 MG PO CAPS
50.0000 mg | ORAL_CAPSULE | Freq: Every evening | ORAL | 2 refills | Status: DC | PRN
  Filled 2023-09-22: qty 30, 30d supply, fill #0
  Filled 2023-10-29: qty 30, 30d supply, fill #1
  Filled 2024-01-12: qty 30, 30d supply, fill #2

## 2023-09-22 NOTE — Telephone Encounter (Signed)
 Estimated Creatinine Clearance: 81.3 mL/min (by C-G formula based on SCr of 1.18 mg/dL).

## 2023-09-23 ENCOUNTER — Other Ambulatory Visit (HOSPITAL_BASED_OUTPATIENT_CLINIC_OR_DEPARTMENT_OTHER): Payer: Self-pay

## 2023-09-23 DIAGNOSIS — Z419 Encounter for procedure for purposes other than remedying health state, unspecified: Secondary | ICD-10-CM | POA: Diagnosis not present

## 2023-09-25 ENCOUNTER — Ambulatory Visit: Payer: Self-pay

## 2023-09-25 ENCOUNTER — Ambulatory Visit (HOSPITAL_COMMUNITY): Admitting: Nurse Practitioner

## 2023-09-25 DIAGNOSIS — J441 Chronic obstructive pulmonary disease with (acute) exacerbation: Secondary | ICD-10-CM

## 2023-09-25 DIAGNOSIS — L089 Local infection of the skin and subcutaneous tissue, unspecified: Secondary | ICD-10-CM

## 2023-09-25 DIAGNOSIS — Z7689 Persons encountering health services in other specified circumstances: Secondary | ICD-10-CM | POA: Diagnosis not present

## 2023-09-25 DIAGNOSIS — K9413 Enterostomy malfunction: Secondary | ICD-10-CM

## 2023-09-25 NOTE — Patient Instructions (Signed)
 Visit Information  Thank you for taking time to visit with me today. Please don't hesitate to contact me if I can be of assistance to you before our next scheduled appointment.  Our next appointment is by telephone on 10/04/23 at 11:00 am Please call the care guide team at [phone] if you need to cancel or reschedule your appointment.   Following is a copy of your care plan:   Goals Addressed      VBCI RN Care Plan       Problems:  Chronic Disease Management support and education needs related to COPD Lacks caregiver support. Non-adherence to prescribed medication regimen  Goal: Over the next 90 days the Patient will attend all scheduled medical appointments: including as evidenced by patient report and review of chart        continue to work with RN Care Manager and/or Social Worker to address care management and care coordination needs related to COPD as evidenced by adherence to CM Team Scheduled appointments     demonstrate Improved adherence to prescribed treatment plan for COPD as evidenced by patient report and review of chart  Interventions:   COPD Interventions: Discussed the importance of adequate rest and management of fatigue with COPD Discussed medications-pharmacy referral regarding medication review-patient would like to be on less medications, medication management, copd education regarding medications  Encouraged patient to refill inhaler and begin to use. Also reinforced both inhaler and nebulizer to assist in managing lung condition Social work referral for assistance with obtaining PCS services   Patient Self-Care Activities:  Attend all scheduled provider appointments Call provider office for new concerns or questions  Take medications as prescribed   Work with the social worker to address care coordination needs and will continue to work with the clinical team to address health care and disease management related needs Work with the pharmacist to address  medication management needs and will continue to work with the clinical team to address health care and disease management related needs  Plan:  The care management team will reach out to the patient again over the next 14 days.        Please call the Suicide and Crisis Lifeline: 988 call the [place]  National Suicide Prevention Lifeline: 581-646-6218 or TTY: (705) 056-0351 TTY 951-234-8813) to talk to a trained counselor if you are experiencing a Mental Health or Behavioral Health Crisis or need someone to talk to.  Patient verbalizes understanding of instructions and care plan provided today and agrees to view in MyChart. Active MyChart status and patient understanding of how to access instructions and care plan via MyChart confirmed with patient.     [name], RN, MSN, BSN, CCM [place]  [facility], Population Health Case Manager Phone: [phone]

## 2023-09-25 NOTE — Patient Outreach (Signed)
 Complex Care Management   Visit Note  09/25/2023  Name:  Luis [name] MRN: [id number] DOB: 02-24-1961  Situation: Referral received for Complex Care Management related to COPD I obtained verbal consent from Patient.  Visit completed with patient  on the phone  Background:   Past Medical History:  Diagnosis Date   Acute respiratory failure with hypoxia (HCC) 12/23/2022   Alcohol withdrawal syndrome, with delirium (HCC) 12/27/2022   COPD (chronic obstructive pulmonary disease) (HCC)    Diabetes mellitus without complication (HCC)    Diverticulitis    DVT (deep venous thrombosis) (HCC)    x3   Encephalopathy acute 12/28/2022   ETOH abuse    H/O colectomy    Hyperlipidemia    Hypertension    Sepsis associated hypotension (HCC) 08/03/2019   Smoker     Assessment: Patient reports could benefit from Personal care assistance. Patient with ileostomy and abdominal wound, he reports tires easily due to chronic illness. He reports recent bout with SOB and cough. He states still sometimes has coughing spells. However, states it is much improved today.   Patient Reported Symptoms:  Cognitive Cognitive Status: Alert and oriented to person, place, and time      Neurological      HEENT HEENT Symptoms Reported: No symptoms reported      Cardiovascular Cardiovascular Symptoms Reported: Not assessed    Respiratory Respiratory Symptoms Reported: Shortness of breath, Dry cough Other Respiratory Symptoms: Patient reports episodes of coughing attack with SOB. Patient states recent episode of PNA. However he states Mucinex has helped and the cough is improving. Patient states he needs to refill inhaler. He reports has not used nebulizer in about 4 days. Respiratory Conditions: COPD, Cough, Shortness of breath Respiratory Self-Management Outcome: 3 (uncertain)  Endocrine Patient reports the following symptoms related to hypoglycemia or hyperglycemia : No symptoms reported Is patient diabetic?:  Yes    Gastrointestinal Gastrointestinal Symptoms Reported: Not assessed      Genitourinary      Integumentary Integumentary Symptoms Reported: Wound Additional Integumentary Details: chronic abdominal wound, iliostomy. patient reports he changes dressing to ilieostomy as indicated, patient states he has an appointment with the [facility] wound ostomy nurse to assist with dressing his chronic abdominal wound. Skin Conditions: Wound Skin Management Strategies: Dressing changes, Medication therapy, Routine screening Skin Self-Management Outcome: 3 (uncertain)  Musculoskeletal Musculoskelatal Symptoms Reviewed: Weakness Additional Musculoskeletal Details: neuropathy both feet make walking difficult, chronic illness Musculoskeletal Management Strategies: Medication therapy, Routine screening Musculoskeletal Self-Management Outcome: 3 (uncertain)      Psychosocial Psychosocial Symptoms Reported: Not assessed     Quality of Family Relationships:  (son moving to [place]. lack of support) Do you feel physically threatened by others?: No      06/15/2023    2:10 PM  Depression screen PHQ 2/9  Decreased Interest 0  Down, Depressed, Hopeless 0  PHQ - 2 Score 0    There were no vitals filed for this visit.  Medications Reviewed Today     Reviewed by Colletta Maryland, RN (Registered Nurse) on 09/25/23 at 1135  Med List Status: <None>   Medication Order Taking? Sig Documenting Provider Last Dose Status Informant  acetaminophen (ACETAMINOPHEN 8 HOUR) 650 MG CR tablet [id number] Yes Take 1 tablet (650 mg total) by mouth every 8 (eight) hours as needed for pain  Patient taking differently: Take 1,300 mg by mouth every 8 (eight) hours as needed for pain or fever.    Taking Active Self, Pharmacy Records  Med Note Earlene Plater, [place]   Tue Sep 12, 2023  1:49 PM) Reports takes 1200 mg at 6am, 1200mg  at 12N, 1200mg  at 6pm and 1200mg  again.  amLODipine (NORVASC) 10 MG tablet [id number] Yes Take 1 tablet  (10 mg total) by mouth daily. [name], NP Taking Active   ascorbic acid (VITAMIN C) 500 MG tablet [id number] Yes Take 1 tablet (500 mg total) by mouth daily.  Taking Active Self, Pharmacy Records  atorvastatin (LIPITOR) 10 MG tablet [id number] Yes Take 1 tablet (10 mg total) by mouth daily. [name], NP Taking Active   cetirizine (ZYRTEC) 10 MG tablet [id number] Yes Take 1 tablet (10 mg total) by mouth daily. [name], NP Taking Active Self, Pharmacy Records  dapagliflozin propanediol (FARXIGA) 10 MG TABS tablet [id number] Yes Take 1 tablet (10 mg total) by mouth daily before breakfast. [name], NP Taking Active Self, Pharmacy Records  dextromethorphan-guaiFENesin [facility] DM) 30-600 MG 12hr tablet [id number] Yes Take 1 tablet by mouth 2 (two) times daily. [provider] Taking Active Self  fluticasone (FLOVENT HFA) 110 MCG/ACT inhaler [id number] No Inhale 2 puffs into the lungs 2 (two) times daily.  Patient not taking: Reported on 09/25/2023   Barnetta Chapel, MD Not Taking Active Self, Pharmacy Records  folic acid (FOLVITE) 1 MG tablet [id number] Yes Take 1 tablet (1 mg total) by mouth daily. [name], NP Taking Active   gabapentin (NEURONTIN) 300 MG capsule [id number] Yes Take 1 capsule (300 mg total) by mouth 2 (two) times daily.  Patient taking differently: Take 600 mg by mouth at bedtime.   Louann Sjogren, DPM Taking Active Self, Pharmacy Records           Med Note Earlene Plater, [place]   Tue Sep 12, 2023  1:43 PM) Reports taking 300 mg in am and 600 mg at night.  hydrOXYzine (VISTARIL) 50 MG capsule [id number] Yes Take 1 capsule (50 mg total) by mouth at bedtime as needed. [name], NP Taking Active     Discontinued 03/15/23 1105 (Change in therapy)   ipratropium-albuterol (DUONEB) 0.5-2.5 (3) MG/3ML SOLN [id number] Yes Take 3 mLs by nebulization every 6 (six) hours as needed. [provider] Taking Active Self, Pharmacy Records  isosorbide mononitrate  (IMDUR) 30 MG 24 hr tablet [id number] Yes Take 1 tablet (30 mg total) by mouth daily. Leroy Sea, MD Taking Active   losartan (COZAAR) 25 MG tablet [id number] Yes Take 1 tablet (25 mg total) by mouth daily. Dorthula Nettles, DO Taking Active Self, Pharmacy Records  metFORMIN (GLUCOPHAGE) 500 MG tablet [id number]  Take 2 tablets (1,000 mg total) by mouth daily with breakfast AND 1 tablet (500 mg total) daily with supper.  Patient taking differently: Take 2 tablets (1,000 mg total) by mouth daily    [name], NP  Active Self, Pharmacy Records  metoprolol succinate (TOPROL-XL) 25 MG 24 hr tablet [id number]  Take 1 tablet (25 mg total) by mouth daily. Dorthula Nettles, DO  Active Self, Pharmacy Records  Multiple Vitamin (MULTIVITAMIN WITH MINERALS) TABS tablet [id number]  Take 1 tablet by mouth every other day.  [provider]  Active Self, Pharmacy Records           Med Note Earlene Plater, [place]   Tue Sep 12, 2023  1:41 PM) Reports takes every day.  nicotine (NICODERM CQ - DOSED IN MG/24 HOURS) 21 mg/24hr patch [id number]  Place 1 patch (21 mg total) onto the skin  daily. Singh, Prashant K, MD  Active   spironolactone (ALDACTONE) 25 MG tablet [id number] Yes Take 0.5 tablets (12.5 mg total) by mouth daily. Horace Lye, PA-C Taking Active            Med Note Lajuana Pilar, Jacky [place]   Tue Sep 12, 2023  2:15 PM) Patient reports misread instructions and has been taking a whole tablet daily.  thiamine (VITAMIN B1) 100 MG tablet [id number]  Take 1 tablet (100 mg total) by mouth daily.   Active Self, Pharmacy Records  torsemide (DEMADEX) 20 MG tablet [id number]  Take 2 tablets (40 mg total) by mouth daily. Ruddy Corral M, PA-C  Active   triamcinolone cream (KENALOG) 0.5 % [id number]  Apply topically to legs twice daily for up to 2 weeks as needed for eczema Zorita Hiss, NP  Active   Zoster Vaccine Adjuvanted [facility]) injection [id number]  Inject into the muscle. Liane Redman, MD  Active  Self, Pharmacy Records          Recommendation:   PCP Follow-up Attend upcoming scheduled appointment with specialist including: PCP 09/28/23; pulmonology 10/03/23; [facility] 10/04/23; advance HF clinic 10/06/23 and [facility](patient reported).  Attend scheduled follow up with wound/ostomy nurse  Follow Up Plan:   Telephone follow-up within 1-2 weeks Referral to Pharmacist Licensed Clinical Social Worker  [name], RN, MSN, BSN, CCM [place]  [facility], Population Health Case Manager Phone: [phone]

## 2023-09-26 ENCOUNTER — Other Ambulatory Visit: Payer: Self-pay | Admitting: Nurse Practitioner

## 2023-09-26 ENCOUNTER — Other Ambulatory Visit (HOSPITAL_COMMUNITY): Payer: Self-pay

## 2023-09-27 ENCOUNTER — Telehealth: Payer: Self-pay | Admitting: *Deleted

## 2023-09-27 ENCOUNTER — Other Ambulatory Visit: Payer: Self-pay

## 2023-09-27 ENCOUNTER — Other Ambulatory Visit (HOSPITAL_COMMUNITY): Payer: Self-pay

## 2023-09-27 DIAGNOSIS — K432 Incisional hernia without obstruction or gangrene: Secondary | ICD-10-CM | POA: Diagnosis not present

## 2023-09-27 DIAGNOSIS — E119 Type 2 diabetes mellitus without complications: Secondary | ICD-10-CM | POA: Diagnosis not present

## 2023-09-27 DIAGNOSIS — Z932 Ileostomy status: Secondary | ICD-10-CM | POA: Diagnosis not present

## 2023-09-27 DIAGNOSIS — Z7689 Persons encountering health services in other specified circumstances: Secondary | ICD-10-CM | POA: Diagnosis not present

## 2023-09-27 DIAGNOSIS — Z87891 Personal history of nicotine dependence: Secondary | ICD-10-CM | POA: Diagnosis not present

## 2023-09-27 DIAGNOSIS — F1011 Alcohol abuse, in remission: Secondary | ICD-10-CM | POA: Diagnosis not present

## 2023-09-27 MED ORDER — FLUTICASONE PROPIONATE HFA 110 MCG/ACT IN AERO
2.0000 | INHALATION_SPRAY | Freq: Two times a day (BID) | RESPIRATORY_TRACT | 2 refills | Status: DC
  Filled 2023-09-27: qty 12, 28d supply, fill #0

## 2023-09-27 NOTE — Progress Notes (Signed)
 Complex Care Management Note Care Guide Note  09/27/2023 Name: Luis [name] MRN: [id number] DOB: 23-Oct-1960   Complex Care Management Outreach Attempts: An unsuccessful telephone outreach was attempted today to offer the patient information about available complex care management services.  Follow Up Plan:  Additional outreach attempts will be made to offer the patient complex care management information and services.   Encounter Outcome:  No Answer  Kandis Ormond, CMA [place]  [facility], [facility] Guide Direct Dial: [phone]  Fax: [phone] Website: [email or web address]

## 2023-09-27 NOTE — Telephone Encounter (Signed)
 Copied from CRM [id number]. Topic: General - Call Back - No Documentation >> Sep 27, 2023  5:13 PM Shereese L wrote: Reason for CRM: patient would like to receive a call back because all his labs aren't done and wants to know if he needs to reschedule the appt.

## 2023-09-28 ENCOUNTER — Telehealth: Payer: Self-pay | Admitting: Acute Care

## 2023-09-28 ENCOUNTER — Ambulatory Visit: Payer: Medicaid Other | Admitting: Nurse Practitioner

## 2023-09-28 ENCOUNTER — Other Ambulatory Visit: Payer: Self-pay

## 2023-09-28 DIAGNOSIS — F17210 Nicotine dependence, cigarettes, uncomplicated: Secondary | ICD-10-CM

## 2023-09-28 DIAGNOSIS — Z122 Encounter for screening for malignant neoplasm of respiratory organs: Secondary | ICD-10-CM

## 2023-09-28 DIAGNOSIS — Z87891 Personal history of nicotine dependence: Secondary | ICD-10-CM

## 2023-09-28 NOTE — Telephone Encounter (Signed)
 Lung Cancer Screening Narrative/Criteria Questionnaire (Cigarette Smokers Only- No Cigars/Pipes/vapes)   Luis [name]   SDMV:11/01/23 at 1130a/KATY                                           03-25-1961              LDCT: 11/03/23 at 1130a/ [facility]    63 y.o.   Phone: [phone] /db  Lung Screening Narrative (confirm age 71-77 yrs Medicare / 50-80 yrs Private pay insurance)   Insurance information:medicaid   Referring Provider:[facility]   This screening involves an initial phone call with a team member from our program. It is called a shared decision making visit. The initial meeting is required by insurance and Medicare to make sure you understand the program. This appointment takes about 15-20 minutes to complete. The CT scan will completed at a separate date/time. This scan takes about 5-10 minutes to complete and you may eat and drink before and after the scan.  Criteria questions for Lung Cancer Screening:   Are you a current or former smoker? Current Age began smoking: 14y   If you are a former smoker, what year did you quit smoking? NA   To calculate your smoking history, I need an accurate estimate of how many packs of cigarettes you smoked per day and for how many years. (Not just the number of PPD you are now smoking)   Years smoking 49 x Packs per day 1/2 to 1 = Pack years 37   (at least 20 pack yrs)   (Make sure they understand that we need to know how much they have smoked in the past, not just the number of PPD they are smoking now)  Do you have a personal history of cancer?  No    Do you have a family history of cancer? Yes  (cancer type and and relative) father Bernardine Bridegroom  Are you coughing up blood?  No  Have you had unexplained weight loss of 15 lbs or more in the last 6 months? No  It looks like you meet all criteria.     Additional information: N/A

## 2023-09-29 NOTE — Progress Notes (Signed)
 Complex Care Management Note  Care Guide Note 09/29/2023 Name: Luis [name] MRN: [id number] DOB: [birth year]  Luis [name] is a 63 y.o. year old male who sees Elnor [name], NP for primary care. I reached out to [name] by phone today to offer complex care management services.  Mr. Fouche was given information about Complex Care Management services today including:   The Complex Care Management services include support from the care team which includes your Nurse Care Manager, Clinical Social Worker, or Pharmacist.  The Complex Care Management team is here to help remove barriers to the health concerns and goals most important to you. Complex Care Management services are voluntary, and the patient may decline or stop services at any time by request to their care team member.   Complex Care Management Consent Status: Patient agreed to services and verbal consent obtained.   Follow up plan:  Telephone appointment with complex care management team member scheduled for:  10/12/2023  Encounter Outcome:  Patient Scheduled  Thedford Franks, CMA [place]  [facility], [facility] Guide Direct Dial: [phone]  Fax: [phone] Website: [email or web address]

## 2023-09-30 ENCOUNTER — Other Ambulatory Visit (HOSPITAL_COMMUNITY): Payer: Self-pay

## 2023-10-02 ENCOUNTER — Ambulatory Visit (INDEPENDENT_AMBULATORY_CARE_PROVIDER_SITE_OTHER): Payer: Self-pay

## 2023-10-02 DIAGNOSIS — I442 Atrioventricular block, complete: Secondary | ICD-10-CM

## 2023-10-03 ENCOUNTER — Ambulatory Visit (INDEPENDENT_AMBULATORY_CARE_PROVIDER_SITE_OTHER): Admitting: Pulmonary Disease

## 2023-10-03 ENCOUNTER — Encounter: Payer: Self-pay | Admitting: Cardiology

## 2023-10-03 ENCOUNTER — Ambulatory Visit (INDEPENDENT_AMBULATORY_CARE_PROVIDER_SITE_OTHER): Admitting: Primary Care

## 2023-10-03 ENCOUNTER — Encounter: Payer: Self-pay | Admitting: Primary Care

## 2023-10-03 VITALS — BP 135/77 | HR 62 | Ht 73.0 in | Wt 235.2 lb

## 2023-10-03 DIAGNOSIS — J449 Chronic obstructive pulmonary disease, unspecified: Secondary | ICD-10-CM | POA: Diagnosis not present

## 2023-10-03 DIAGNOSIS — R0609 Other forms of dyspnea: Secondary | ICD-10-CM

## 2023-10-03 DIAGNOSIS — K439 Ventral hernia without obstruction or gangrene: Secondary | ICD-10-CM

## 2023-10-03 DIAGNOSIS — Z7689 Persons encountering health services in other specified circumstances: Secondary | ICD-10-CM | POA: Diagnosis not present

## 2023-10-03 DIAGNOSIS — I5032 Chronic diastolic (congestive) heart failure: Secondary | ICD-10-CM | POA: Diagnosis not present

## 2023-10-03 LAB — PULMONARY FUNCTION TEST
DL/VA % pred: 92 %
DL/VA: 3.84 ml/min/mmHg/L
DLCO cor % pred: 75 %
DLCO cor: 20.22 ml/min/mmHg
DLCO unc % pred: 73 %
DLCO unc: 19.75 ml/min/mmHg
FEF 25-75 Post: 1.31 L/s
FEF 25-75 Pre: 1.11 L/s
FEF2575-%Change-Post: 18 %
FEF2575-%Pred-Post: 47 %
FEF2575-%Pred-Pre: 39 %
FEV1-%Change-Post: 4 %
FEV1-%Pred-Post: 58 %
FEV1-%Pred-Pre: 56 %
FEV1-Post: 2.04 L
FEV1-Pre: 1.95 L
FEV1FVC-%Change-Post: 6 %
FEV1FVC-%Pred-Pre: 85 %
FEV6-%Change-Post: -1 %
FEV6-%Pred-Post: 66 %
FEV6-%Pred-Pre: 67 %
FEV6-Post: 2.92 L
FEV6-Pre: 2.98 L
FEV6FVC-%Change-Post: 0 %
FEV6FVC-%Pred-Post: 103 %
FEV6FVC-%Pred-Pre: 103 %
FVC-%Change-Post: -1 %
FVC-%Pred-Post: 65 %
FVC-%Pred-Pre: 65 %
FVC-Post: 3.02 L
FVC-Pre: 3.05 L
Post FEV1/FVC ratio: 68 %
Post FEV6/FVC ratio: 98 %
Pre FEV1/FVC ratio: 64 %
Pre FEV6/FVC Ratio: 98 %
RV % pred: 72 %
RV: 1.69 L
TLC % pred: 68 %
TLC: 4.79 L

## 2023-10-03 LAB — CUP PACEART REMOTE DEVICE CHECK
Battery Remaining Longevity: 96 mo
Battery Voltage: 3.02 V
Brady Statistic AS VP Percent: 0 %
Brady Statistic AS VS Percent: 0 %
Brady Statistic RV Percent Paced: 99.6 %
Date Time Interrogation Session: 20250422150856
Implantable Pulse Generator Implant Date: 20220721
Lead Channel Impedance Value: 590 Ohm
Lead Channel Pacing Threshold Amplitude: 0.25 V
Lead Channel Pacing Threshold Pulse Width: 0.24 ms
Lead Channel Sensing Intrinsic Amplitude: 21.488 mV
Lead Channel Setting Pacing Amplitude: 0.75 V
Lead Channel Setting Pacing Pulse Width: 0.24 ms
Lead Channel Setting Sensing Sensitivity: 2 mV

## 2023-10-03 MED ORDER — PROMETHAZINE-DM 6.25-15 MG/5ML PO SYRP
5.0000 mL | ORAL_SOLUTION | Freq: Four times a day (QID) | ORAL | 0 refills | Status: DC | PRN
Start: 2023-10-03 — End: 2023-10-11
  Filled 2023-10-03: qty 240, 12d supply, fill #0

## 2023-10-03 MED ORDER — TIOTROPIUM BROMIDE-OLODATEROL 2.5-2.5 MCG/ACT IN AERS
2.0000 | INHALATION_SPRAY | Freq: Every day | RESPIRATORY_TRACT | 3 refills | Status: DC
Start: 2023-10-03 — End: 2024-02-23
  Filled 2023-10-03: qty 4, 30d supply, fill #0
  Filled 2023-12-25: qty 4, 30d supply, fill #1
  Filled 2023-12-25: qty 4, 30d supply, fill #0
  Filled 2023-12-27: qty 4, 30d supply, fill #1

## 2023-10-03 MED ORDER — GUAIFENESIN ER 600 MG PO TB12: 600.0000 mg | ORAL_TABLET | Freq: Two times a day (BID) | ORAL | Status: DC | PRN

## 2023-10-03 NOTE — Progress Notes (Signed)
 @Patient  ID: Luis [name], male    DOB: [birth year], 63 y.o.   MRN: [id number]  No chief complaint on file.   Referring provider: Zorita Hiss, NP  HPI: 63 year old male, current every day smoker.  Past medical history significant for hypertension, CHF, AV heart block, demand ischemia, COPD, community-acquired pneumonia, allergic rhinitis, abdominal hernia, intra-abdominal abscess, status post ileostomy, type 2 diabetes, history of DVT, HLD, alcoholism, obesity.  Previous [facility] pulmonary encounter: 04/26/23- Dr. Marygrace Snellen 63 y.o. man whom we are seeing for evaluation of dyspnea on exertion.  Multiple cardiology notes reviewed.  Documentation from hospitalizations reviewed.  He has been short of breath for some time.  Underwent significant surgeries couple years ago.  Since then been short of breath.  Less active.  Previously was very active.  He continues to smoke.  Dyspneic with minimal exertion.  At rest is okay.  12/2022 get a CT scan while hospitalized that showed small bilateral effusions and bilateral dependent atelectasis versus infiltrate.  He was treated for pneumonia.  Frankly it looks like atelectasis.  12/2022 he had TTE with grade 2 diastolic dysfunction and dilated LA.  02/2023 he had a chest x-ray that looks volume overloaded with small bilateral pleural effusions, interlobular septal thickening, and enlarged heart.  In terms of improving his dyspnea, he states things are better with decreased swelling in his legs.  He is increased his Lasix  recently.  Breathing has improved.  He has tried nebulizers and inhalers.  Does not seem to help much.  Only using as needed.  We discussed a strategy using on a scheduled basis to see if there is any improvement throughout the day.  He expressed understanding.  10/03/2023- interim hx  Discussed the use of AI scribe software for clinical note transcription with the patient, who gave verbal consent to proceed.  History of Present Illness   Luis [name] is a 63 year old male with COPD and diastolic heart failure who presents with shortness of breath. He was referred by Dr. Marygrace Snellen for evaluation of shortness of breath.  He has a history of COPD, confirmed by a recent breathing test showing stage 2 COPD with lung function at 58% predicted. He experiences shortness of breath with exertion, such as walking in a grocery store, which sometimes makes him feel like he might pass out. He also has a chronic cough with mucus production, worse at night, somewhat relieved by Mucinex . No wheezing or chest tightness, but he occasionally coughs to clear his airways when he hears himself wheezing. He is currently using Flovent  and a nebulizer but does not use the inhaler consistently. He has been hospitalized three times in the last eight months for pneumonia, with the most recent hospitalization on August 06, 2023, lasting eight days. He believes his breathing issues have been somewhat controlled at home recently.  He has a significant smoking history of 47 years and continues to smoke. He reports a history of alcohol use, stating he drinks 'a little bit.' He denies any issues with aspiration, such as trouble swallowing or choking when eating, and has not experienced any episodes of food going down the windpipe. He mentions a history of incarceration where he sustained an abdominal wound, stating he has been home from prison for four and a half years and has not vomited since his release.  He has a history of diastolic heart failure and was hospitalized for congestive heart failure within the last six months. He is awaiting surgery for a ventral  hernia and ileostomy reversal, which has been delayed due to his heart condition. He reports gaining weight, which he attributes to fluid retention, and is on diuretics to manage this. No current symptoms of acid reflux, stomach pain, or trouble swallowing, but he mentions a history of an abdominal wound and an  ileostomy.     Pulmonary function testing  10/03/2023 PFT>> FVC 3.02 (65%), FEV1 2.04 (58%), ratio 68 Moderate obstructive airway disease without bronchodilator response  Allergies  Allergen Reactions   Lisinopril     Other reaction(s): renal effects   Codeine Other (See Comments) and Hives    unknown Other reaction(s): Other (See Comments) unknown unknown    Immunization History  Administered Date(s) Administered   Influenza, Seasonal, Injecte, Preservative Fre 03/02/2023   PNEUMOCOCCAL CONJUGATE-20 03/02/2023   Respiratory Syncytial Virus Vaccine ,Recomb Aduvanted(Arexvy ) 07/18/2023   Zoster Recombinant(Shingrix ) 07/18/2023    Past Medical History:  Diagnosis Date   Acute respiratory failure with hypoxia (HCC) 12/23/2022   Alcohol withdrawal syndrome, with delirium (HCC) 12/27/2022   COPD (chronic obstructive pulmonary disease) (HCC)    Diabetes mellitus without complication (HCC)    Diverticulitis    DVT (deep venous thrombosis) (HCC)    x3   Encephalopathy acute 12/28/2022   ETOH abuse    H/O colectomy    Hyperlipidemia    Hypertension    Sepsis associated hypotension (HCC) 08/03/2019   Smoker     Tobacco History: Social History   Tobacco Use  Smoking Status Every Day   Current packs/day: 1.00   Average packs/day: 1 pack/day for 44.0 years (44.0 ttl pk-yrs)   Types: Cigarettes  Smokeless Tobacco Current   Ready to quit: Not Answered Counseling given: Not Answered   Outpatient Medications Prior to Visit  Medication Sig Dispense Refill   acetaminophen  (ACETAMINOPHEN  8 HOUR) 650 MG CR tablet Take 1 tablet (650 mg total) by mouth every 8 (eight) hours as needed for pain (Patient taking differently: Take 1,300 mg by mouth every 8 (eight) hours as needed for pain or fever.) 90 tablet 1   amLODipine  (NORVASC ) 10 MG tablet Take 1 tablet (10 mg total) by mouth daily. 90 tablet 0   ascorbic acid  (VITAMIN C ) 500 MG tablet Take 1 tablet (500 mg total) by mouth  daily. 30 tablet 1   atorvastatin  (LIPITOR) 10 MG tablet Take 1 tablet (10 mg total) by mouth daily. 90 tablet 1   cetirizine  (ZYRTEC ) 10 MG tablet Take 1 tablet (10 mg total) by mouth daily. 90 tablet 1   dapagliflozin  propanediol (FARXIGA ) 10 MG TABS tablet Take 1 tablet (10 mg total) by mouth daily before breakfast. 90 tablet 2   dextromethorphan -guaiFENesin  (MUCINEX  DM) 30-600 MG 12hr tablet Take 1 tablet by mouth 2 (two) times daily.     fluticasone  (FLOVENT  HFA) 110 MCG/ACT inhaler Inhale 2 puffs into the lungs 2 (two) times daily. 12 g 2   folic acid  (FOLVITE ) 1 MG tablet Take 1 tablet (1 mg total) by mouth daily. 90 tablet 0   gabapentin  (NEURONTIN ) 300 MG capsule Take 1 capsule (300 mg total) by mouth 2 (two) times daily. (Patient taking differently: Take 600 mg by mouth at bedtime.) 90 capsule 3   hydrOXYzine  (VISTARIL ) 50 MG capsule Take 1 capsule (50 mg total) by mouth at bedtime as needed. 30 capsule 2   ipratropium-albuterol  (DUONEB) 0.5-2.5 (3) MG/3ML SOLN Take 3 mLs by nebulization every 6 (six) hours as needed.     isosorbide  mononitrate (IMDUR ) 30 MG 24 hr  tablet Take 1 tablet (30 mg total) by mouth daily. 30 tablet 0   losartan  (COZAAR ) 25 MG tablet Take 1 tablet (25 mg total) by mouth daily. 30 tablet 5   metFORMIN  (GLUCOPHAGE ) 500 MG tablet Take 2 tablets (1,000 mg total) by mouth daily with breakfast AND 1 tablet (500 mg total) daily with supper. (Patient taking differently: Take 2 tablets (1,000 mg total) by mouth daily ) 90 tablet 3   metoprolol  succinate (TOPROL -XL) 25 MG 24 hr tablet Take 1 tablet (25 mg total) by mouth daily. 90 tablet 3   Multiple Vitamin (MULTIVITAMIN WITH MINERALS) TABS tablet Take 1 tablet by mouth every other day.      nicotine  (NICODERM CQ  - DOSED IN MG/24 HOURS) 21 mg/24hr patch Place 1 patch (21 mg total) onto the skin daily. 28 patch 0   spironolactone  (ALDACTONE ) 25 MG tablet Take 0.5 tablets (12.5 mg total) by mouth daily. 45 tablet 3    thiamine  (VITAMIN B1) 100 MG tablet Take 1 tablet (100 mg total) by mouth daily. 90 tablet 0   torsemide  (DEMADEX ) 20 MG tablet Take 2 tablets (40 mg total) by mouth daily. 60 tablet 3   triamcinolone  cream (KENALOG ) 0.5 % Apply topically to legs twice daily for up to 2 weeks as needed for eczema 30 g 1   Zoster Vaccine Adjuvanted (SHINGRIX ) injection Inject into the muscle. 1 each 1   No facility-administered medications prior to visit.   Review of Systems  Review of Systems  Constitutional: Negative.   HENT: Negative.    Respiratory:  Positive for cough and shortness of breath.   Cardiovascular: Negative.    Physical Exam  There were no vitals taken for this visit. Physical Exam Constitutional:      Appearance: Normal appearance. He is obese. He is not ill-appearing.  HENT:     Head: Normocephalic and atraumatic.  Cardiovascular:     Rate and Rhythm: Normal rate and regular rhythm.  Pulmonary:     Effort: Pulmonary effort is normal.     Breath sounds: Normal breath sounds. No wheezing or rhonchi.  Abdominal:     General: There is distension.     Hernia: A hernia is present.     Comments: Hyperactive BS Right lower quadrant ileostomy   Musculoskeletal:        General: Normal range of motion.  Skin:    General: Skin is warm and dry.     Comments: Large abd wound with clean dry dressing in place   Neurological:     General: No focal deficit present.     Mental Status: He is alert and oriented to person, place, and time. Mental status is at baseline.  Psychiatric:        Mood and Affect: Mood normal.        Behavior: Behavior normal.        Thought Content: Thought content normal.        Judgment: Judgment normal.      Lab Results:  CBC    Component Value Date/Time   WBC 10.6 (H) 08/11/2023 0528   RBC 4.03 (L) 08/11/2023 0528   HGB 13.8 08/11/2023 0528   HCT 40.8 08/11/2023 0528   PLT 212 08/11/2023 0528   MCV 101.2 (H) 08/11/2023 0528   MCH 34.2 (H)  08/11/2023 0528   MCHC 33.8 08/11/2023 0528   RDW 14.5 08/11/2023 0528   LYMPHSABS 1.3 08/11/2023 0528   MONOABS 1.1 (H) 08/11/2023 0528   EOSABS  0.0 08/11/2023 0528   BASOSABS 0.0 08/11/2023 0528    BMET    Component Value Date/Time   NA 134 (L) 09/13/2023 1200   NA 142 05/14/2021 1246   K 4.8 09/13/2023 1200   CL 96 (L) 09/13/2023 1200   CO2 27 09/13/2023 1200   GLUCOSE 167 (H) 09/13/2023 1200   BUN 19 09/13/2023 1200   BUN 11 05/14/2021 1246   CREATININE 1.18 09/13/2023 1200   CALCIUM  10.8 (H) 09/13/2023 1200   GFRNONAA >60 09/13/2023 1200   GFRAA >60 08/03/2019 0544    BNP    Component Value Date/Time   BNP 204.8 (H) 09/05/2023 0943    ProBNP No results found for: "PROBNP"  Imaging: No results found.   Assessment & Plan:   1. Stage 2 moderate COPD by GOLD classification (HCC) (Primary)  2. Abdominal wall hernia at previous stoma site  3. Chronic diastolic CHF (congestive heart failure) (HCC)  Assessment and Plan    Stage 2 COPD Moderate COPD with 58% lung function. Symptoms include exertional dyspnea and chronic productive cough. No reversibility on spirometry. Significant impact on quality of life. Recommend coming off inhaled corticosteroids as this can increase risk of pneumonia. No peripheral eosinophilia or reversibility on PFTs.  - Discontinue Flovent  inhaler. - Prescribe Stiolto Respimat  2 puffs every morning  - Continue Mucinex  600mg  twice daily and sending in RX promethazine -dm QID prn cough/chest congestion   Recurrent pneumonia Three hospitalizations in the last 12 months, last in February for acute respiratory failure secondary to CAP, COPD exacerbation and acute on chronic diastolic heart failure. No pneumonia since February, recent chest x-ray in March 2024 showed clear lungs.   Congestive heart failure Contributing to dyspnea and exertional limitations. Managed with diuretics. Awaiting cardiac clearance for ileostomy reversal  surgery.  Ventral hernia Increases risk of aspiration and respiratory symptoms. Surgical repair planned post-cardiac clearance and ileostomy reversal.  Ileostomy status Current ileostomy with planned reversal pending cardiac clearance. No current issues reported.   Follow-up 6-8 weeks with Dr. Marygrace Snellen or Jerlene Moody NP     40 mins spent on case: > 50% face to face with patient  Antonio Baumgarten, NP 10/03/2023

## 2023-10-03 NOTE — Patient Instructions (Signed)
 Full PFT Performed Today

## 2023-10-03 NOTE — Progress Notes (Signed)
Patient seen in the office today and instructed on use of Stiolto.  Patient expressed understanding and demonstrated technique. 

## 2023-10-03 NOTE — Progress Notes (Signed)
 Full PFT Performed Today

## 2023-10-03 NOTE — Patient Instructions (Addendum)
 -  STAGE 2 COPD: You have moderate COPD, which means your lung function is at 58% of what is expected. This condition causes shortness of breath and a chronic cough. We will discontinue your Flovent  inhaler and start you on a new inhaler called Stiolto Respimat , take 2 puffs daily in the morning. Stop mucinex -dm. Take plain Mucinex  600mg  twice daily as needed to loosen chest congestion. Continue Zyrtec  as needed. Sending in RX promethazine -dextromethorphan  every 6 hours as needed for cough suppression.   -RECURRENT PNEUMONIA: You have had three hospitalizations for pneumonia in the last eight months, with the most recent one in February. Your recent chest x-ray is clear, and there have been no new episodes of pneumonia since February.  INSTRUCTIONS:  Please follow up with your cardiologist to obtain cardiac clearance for your upcoming surgeries. Continue taking your medications as prescribed, and start using the new inhaler as directed. If you experience any worsening of symptoms or new issues, please contact   Follow-up 6-8 weeks with Dr. Marygrace Snellen or Jerlene Moody NP

## 2023-10-04 ENCOUNTER — Other Ambulatory Visit (HOSPITAL_COMMUNITY): Payer: Self-pay

## 2023-10-04 ENCOUNTER — Other Ambulatory Visit: Payer: Self-pay

## 2023-10-04 ENCOUNTER — Encounter: Payer: Self-pay | Admitting: Podiatry

## 2023-10-04 ENCOUNTER — Ambulatory Visit (INDEPENDENT_AMBULATORY_CARE_PROVIDER_SITE_OTHER): Admitting: Podiatry

## 2023-10-04 DIAGNOSIS — Z7689 Persons encountering health services in other specified circumstances: Secondary | ICD-10-CM | POA: Diagnosis not present

## 2023-10-04 DIAGNOSIS — M79675 Pain in left toe(s): Secondary | ICD-10-CM

## 2023-10-04 DIAGNOSIS — E1142 Type 2 diabetes mellitus with diabetic polyneuropathy: Secondary | ICD-10-CM

## 2023-10-04 DIAGNOSIS — B351 Tinea unguium: Secondary | ICD-10-CM

## 2023-10-04 DIAGNOSIS — M79674 Pain in right toe(s): Secondary | ICD-10-CM

## 2023-10-04 NOTE — Patient Outreach (Signed)
 Complex Care Management   Visit Note  10/04/2023  Name:  Luis [name] MRN: [id number] DOB: [birth year]  Situation: Referral received for Complex Care Management related to Heart Failure I obtained verbal consent from Patient.  Visit completed with patient  on the phone  Background:   Past Medical History:  Diagnosis Date   Acute respiratory failure with hypoxia (HCC) 12/23/2022   Alcohol withdrawal syndrome, with delirium (HCC) 12/27/2022   COPD (chronic obstructive pulmonary disease) (HCC)    Diabetes mellitus without complication (HCC)    Diverticulitis    DVT (deep venous thrombosis) (HCC)    x3   Encephalopathy acute 12/28/2022   ETOH abuse    H/O colectomy    Hyperlipidemia    Hypertension    Sepsis associated hypotension (HCC) 08/03/2019   Smoker     Assessment: Patient Reported Symptoms: Cognitive Cognitive Status: Alert and oriented to person, place, and time, Insightful and able to interpret abstract concepts      Neurological Neurological Review of Symptoms: Other: [place] Neurological Symptoms/Conditions [RPT]: bilateral feet neuropathy Neurological Management Strategies: Medication therapy, Routine screening Neurological Self-Management Outcome: 3 (uncertain)  HEENT HEENT Symptoms Reported: No symptoms reported      Cardiovascular Cardiovascular Symptoms Reported: No symptoms reported, Fatigue Does patient have uncontrolled Hypertension?: No Cardiovascular Conditions: Hypertension, Heart failure Cardiovascular Management Strategies: Medication therapy, Routine screening Weight: 233 lb (105.7 kg) (per patient, home reading morning 10/04/23)  Respiratory Respiratory Symptoms Reported: Shortness of breath Other Respiratory Symptoms: reports SOB with activity- reports cough improved Respiratory Conditions: COPD  Endocrine Patient reports the following symptoms related to hypoglycemia or hyperglycemia : No symptoms reported    Gastrointestinal Gastrointestinal  Symptoms Reported: No symptoms reported Additional Gastrointestinal Details: RLQ ileostomy Gastrointestinal Management Strategies: Ileostomy, Medication therapy Gastrointestinal Self-Management Outcome: 3 (uncertain)    Genitourinary Genitourinary Symptoms Reported: No symptoms reported    Integumentary Integumentary Symptoms Reported: Wound Additional Integumentary Details: patient reports unchanged, saw wound ostomy nurse last week    Musculoskeletal Musculoskelatal Symptoms Reviewed: Difficulty walking Additional Musculoskeletal Details: 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 Musculoskeletal Conditions: Back pain (per patient, poor abdominal muscles lead to back pain if patient is walking any distance.) Musculoskeletal Management Strategies: Medication therapy, Routine screening Musculoskeletal Self-Management Outcome: 3 (uncertain) Falls in the past year?: No    Psychosocial Psychosocial Symptoms Reported: Depression - if selected complete PHQ 2-9, Anxiety - if selected complete GAD Additional Psychological Details: Patient expresses depression/anxiety. Patient reports he is trying to get disability and in-home aide(PCS services); he is also concerned about losing his home; could benefit from coping strategies. due to timing unable to complete additional screenings at this time (PHQ2/9 and GAD). LCSW telephone call scheduled for 10/12/23. per review of chart alcohol dependence            06/15/2023    2:10 PM  Depression screen PHQ 2/9  Decreased Interest 0  Down, Depressed, Hopeless 0  PHQ - 2 Score 0    There were no vitals filed for this visit.  Medications Reviewed Today     Reviewed by Zebediah Beezley M, RN (Registered Nurse) on 10/04/23 at 1128  Med List Status: <None>   Medication Order Taking? Sig Documenting Provider Last Dose Status Informant  acetaminophen  (ACETAMINOPHEN  8 HOUR) 650 MG CR tablet [id number] Yes Take 1 tablet (650 mg total) by mouth every 8  (eight) hours as needed for pain  Patient taking differently: Take 1,300 mg by mouth every 8 (eight) hours as needed for pain  or fever.    Taking Active Self, Pharmacy Records           Med Note Lajuana Pilar, Jacky Massing   Tue Sep 12, 2023  1:49 PM) Reports takes 1200 mg at 6am, 1200mg  at 12N, 1200mg  at 6pm and 1200mg  again.  amLODipine  (NORVASC ) 10 MG tablet [id number] Yes Take 1 tablet (10 mg total) by mouth daily. Zorita Hiss, NP Taking Active   ascorbic acid  (VITAMIN C ) 500 MG tablet [id number] Yes Take 1 tablet (500 mg total) by mouth daily.  Taking Active Self, Pharmacy Records  atorvastatin  (LIPITOR) 10 MG tablet [id number] Yes Take 1 tablet (10 mg total) by mouth daily. Zorita Hiss, NP Taking Active   cetirizine  (ZYRTEC ) 10 MG tablet [id number] Yes Take 1 tablet (10 mg total) by mouth daily. Zorita Hiss, NP Taking Active Self, Pharmacy Records  dapagliflozin  propanediol (FARXIGA ) 10 MG TABS tablet [id number] Yes Take 1 tablet (10 mg total) by mouth daily before breakfast. Zorita Hiss, NP Taking Active Self, Pharmacy Records  fluticasone  (FLOVENT  [facility]) 110 MCG/ACT inhaler [id number] Yes Inhale 2 puffs into the lungs 2 (two) times daily. Zorita Hiss, NP Taking Active   folic acid  (FOLVITE ) 1 MG tablet [id number]  Take 1 tablet (1 mg total) by mouth daily. Zorita Hiss, NP  Active   gabapentin  (NEURONTIN ) 300 MG capsule [id number]  Take 1 capsule (300 mg total) by mouth 2 (two) times daily.  Patient taking differently: Take 600 mg by mouth at bedtime.   Sikora, Rebecca, DPM  Active Self, Pharmacy Records           Med Note Lajuana Pilar, Americo Baker M   Tue Sep 12, 2023  1:43 PM) Reports taking 300 mg in am and 600 mg at night.  guaiFENesin  (MUCINEX ) 600 MG 12 hr tablet [id number]  Take 1 tablet (600 mg total) by mouth 2 (two) times daily as needed for to loosen phlegm or cough. Antonio Baumgarten, NP  Active   hydrOXYzine  (VISTARIL ) 50 MG capsule [id number]  Take 1 capsule (50 mg total) by mouth at bedtime as  needed. Zorita Hiss, NP  Active     Discontinued 03/15/23 1105 (Change in therapy)   ipratropium-albuterol  (DUONEB) 0.5-2.5 (3) MG/3ML SOLN [id number]  Take 3 mLs by nebulization every 6 (six) hours as needed. [provider]  Active Self, Pharmacy Records  isosorbide  mononitrate (IMDUR ) 30 MG 24 hr tablet [id number]  Take 1 tablet (30 mg total) by mouth daily. Singh, Prashant K, MD  Active   losartan  (COZAAR ) 25 MG tablet [id number]  Take 1 tablet (25 mg total) by mouth daily. Alwin Baars, DO  Active Self, Pharmacy Records  metFORMIN  (GLUCOPHAGE ) 500 MG tablet [id number]  Take 2 tablets (1,000 mg total) by mouth daily with breakfast AND 1 tablet (500 mg total) daily with supper.  Patient taking differently: Take 2 tablets (1,000 mg total) by mouth daily    Zorita Hiss, NP  Active Self, Pharmacy Records  metoprolol  succinate (TOPROL -XL) 25 MG 24 hr tablet [id number]  Take 1 tablet (25 mg total) by mouth daily. Alwin Baars, DO  Active Self, Pharmacy Records  Multiple Vitamin (MULTIVITAMIN WITH MINERALS) TABS tablet [id number]  Take 1 tablet by mouth every other day.  [provider]  Active Self, Pharmacy Records           Med Note Lajuana Pilar, [place] M   Tue Sep 12, 2023  1:41 PM) Reports takes every day.  nicotine  (NICODERM CQ  -  DOSED IN MG/24 HOURS) 21 mg/24hr patch [id number]  Place 1 patch (21 mg total) onto the skin daily. Singh, Prashant K, MD  Active   promethazine -dextromethorphan  (PROMETHAZINE -DM) 6.25-15 MG/5ML syrup [id number]  Take 5 mLs by mouth 4 (four) times daily as needed. Antonio Baumgarten, NP  Active   spironolactone  (ALDACTONE ) 25 MG tablet [id number]  Take 0.5 tablets (12.5 mg total) by mouth daily. Horace Lye, PA-C  Active            Med Note Lajuana Pilar, Okema Rollinson M   Tue Sep 12, 2023  2:15 PM) Patient reports misread instructions and has been taking a whole tablet daily.  thiamine  (VITAMIN B1) 100 MG tablet [id number]  Take 1 tablet (100 mg total) by  mouth daily.   Active Self, Pharmacy Records  Tiotropium Bromide -Olodaterol 2.5-2.5 MCG/ACT AERS [id number]  Inhale 2 puffs into the lungs daily. Antonio Baumgarten, NP  Active   torsemide  (DEMADEX ) 20 MG tablet [id number]  Take 2 tablets (40 mg total) by mouth daily. Ruddy Corral M, PA-C  Active   triamcinolone  cream (KENALOG ) 0.5 % [id number]  Apply topically to legs twice daily for up to 2 weeks as needed for eczema Zorita Hiss, NP  Active   Zoster Vaccine Adjuvanted (SHINGRIX ) injection [id number]  Inject into the muscle.  Patient not taking: Reported on 10/03/2023   Liane Redman, MD  Active Self, Pharmacy Records          Recommendation:   PCP Follow-up  Follow Up Plan:   Telephone follow up appointment date/time:  10/19/23  [name], RN, MSN, BSN, CCM [place]  [facility], Population Health Case Manager Phone: [phone]

## 2023-10-04 NOTE — Patient Instructions (Signed)
 Visit Information  [name] was given information about Medicaid Managed Care team care coordination services as a part of their [facility] Medicaid benefit. [name] verbally consented to engagement with the [facility] Managed Care team.   If you are experiencing a medical emergency, please call 911 or report to your local emergency department or urgent care.   If you have a non-emergency medical problem during routine business hours, please contact your provider's office and ask to speak with a nurse.   For questions related to your [facility] health plan, please call: [phone] or go here:[email or web address]  If you would like to schedule transportation through your [facility] plan, please call the following number at least 2 days in advance of your appointment: [phone].   You can also use the MTM portal or MTM mobile app to manage your rides. Reimbursement for transportation is available through [facility]! For the portal, please go to [email or web address]  Call the [facility] Crisis Line at [phone], at any time, 24 hours a day, 7 days a week. If you are in danger or need immediate medical attention call 911.  If you would like help to quit smoking, call 1-800-QUIT-NOW ((605)394-2947) OR Espaol: 1-855-Djelo-Ya (1-027-253-6644) o para ms informacin haga clic aqu or Text READY to 034-742 to register via text  [name] - following are the goals we discussed in your visit today:   Goals Addressed             This Visit's Progress    VBCI RN Care Plan       Problems:  Chronic Disease Management support and education needs related to COPD Lacks caregiver support. Non-adherence to prescribed medication regimen  Goal: Over the next 90 days the Patient will attend all scheduled medical appointments: including primary care provider 10/05/23; advance heart failure clinic 10/06/23;  as evidenced by patient report and review of chart         continue to work with RN Care Manager and/or Social Worker to address care management and care coordination needs related to COPD as evidenced by adherence to care management team scheduled appointments     demonstrate Improved adherence to prescribed treatment plan for COPD as evidenced by patient report and review of chart  Interventions:   COPD Interventions: Discussed the importance of adequate rest and management of fatigue with COPD Discussed medications/pharmacy referral regarding medication review-patient would like to be on less medications, medication management, copd education regarding medications. Patient reports today that he is going to discuss medications with his primary provider at office visit on 10/05/23. He states he will let RNCM know if he would like to speak with clinical pharmacist in the future. RNCM reviewed patient instructions per pulmonology office visit completed on 10/03/23 Encouraged patient to complete telephone call per LCSW scheduled for 10/12/23: patient expresses depression/anxiety. Patient reports he is trying to get disability and in-home aide(PCS services); he is also concerned about losing his home; could benefit from coping strategies.   Patient Self-Care Activities:  Attend all scheduled provider appointments Call provider office for new concerns or questions  Take medications as prescribed   Work with the social worker to address care coordination needs and will continue to work with the clinical team to address health care and disease management related needs Continue to weigh and record weights and notify provider if signs/symptoms of HF exacerbation  Plan:  Telephone follow up appointment with care management team member scheduled for:  10/19/22 at 11:00 am  VBCI RN Care Plan       Problems:  Chronic Disease Management support and education needs related to CHF  Goal: Over the next 90 days the Patient will attend all scheduled medical  appointments: including advance HF clinic scheduled for 10/05/23 as evidenced by patient report and review of chart        continue to work with RN Care Manager and/or Social Worker to address care management and care coordination needs related to CHF as evidenced by adherence to care management team scheduled appointments     demonstrate Improved adherence to prescribed treatment plan for CHF as evidenced by  patient report or review of chart  Interventions:   Heart Failure Interventions: Assessed need for readable accurate scales in home Reviewed role of diuretics in prevention of fluid overload and management of heart failure; Discussed the importance of keeping all appointments with provider Screening for signs and symptoms of depression related to chronic disease state  Advised patient to continue to monitor weights and notify provider if worsening of condition  Patient Self-Care Activities:  Attend all scheduled provider appointments Call provider office for new concerns or questions  Take medications as prescribed   Work with the social worker to address care coordination needs and will continue to work with the clinical team to address health care and disease management related needs call office if I gain more than 2 pounds in one day or 5 pounds in one week watch for swelling in feet, ankles and legs every day weigh myself daily track symptoms and what helps feel better or worse  Plan:  Telephone follow up appointment with care management team member scheduled for:  10/19/23 at 11:00 am           RN Care Manager will outreach on 10/19/23 at 11:00 am  [name], RN, MSN, BSN, CCM [address]  [facility], Population Health Case Manager Phone: [phone]    Chronic Obstructive Pulmonary Disease  Chronic obstructive pulmonary disease (COPD) is a long-term (chronic) lung problem. When you have COPD, it can feel harder to breathe in or out. The  condition may get worse over time. There are things you can do to keep yourself as healthy as possible. What are the causes? Smoking. This is the most common cause. Breathing in fumes, smoke, or chemicals for a long time. Genes that are inherited, which means they are passed down from parent to child. What are the signs or symptoms? Shortness of breath. This may happen all the time. This may get worse when you move your body. This may get worse over time. You may have times when this becomes much worse all of a sudden. These are called flare-ups or exacerbations. A long-term cough, with or without thick mucus. Wheezing. Chest tightness. Feeling tired. Not being able to do activities like you used to do. How is this diagnosed? This condition is diagnosed based on: Your medical history. A physical exam. Lung (pulmonary) function tests. You may have a test that measures the air flow out of the lungs when you breathe out. You may also have tests, including: Chest X-ray. CT scan. Blood tests. How is this treated? This condition may be treated by: Quitting smoking, if you smoke. Using oxygen. Taking medicines. These may include: Inhalers. These have medicines in them that you breathe in. Daily inhalers. These help to prevent symptoms from happening. They are usually taken every day to prevent COPD flare-ups. Quick relief inhalers. These act fast to  relieve symptoms. They are used only when needed and provide short-term relief. Other medicines that you breathe in or swallow. These may be used to open the airways, thin mucus, or treat infections. Breathing exercises to help you control or catch your breath. A mucus clearing device, if you have a lot of thick mucus. Pulmonary rehab. A place where you will learn about your condition and the best ways for you to manage it. Surgery. Follow these instructions at home: Medicines Take your medicines as told by your health care  provider. Talk to your provider before taking any cough or allergy medicines. You may need to avoid medicines that cause your lungs to be dry. Lifestyle Several times a day, wash your hands with soap and water for at least 20 seconds. If you cannot use soap and water, use hand sanitizer. This may help keep you from getting an infection. Avoid being around crowds or people who are sick. Do not smoke or use any products that contain nicotine  or tobacco. If you need help quitting, ask your provider. Stay active. Learn how to pace your activity during the day. Learn how to breathe to control your stress and catch your breath. Drink enough fluid to keep your pee (urine) pale yellow, unless you have been told not to. Eat healthy foods. Eat smaller meals more often. Get enough sleep. Most adults need 7 or more hours per night. General instructions Make a COPD action plan with your provider. This helps you to know what to do if you feel worse than usual. Make sure you get all the shots, also called vaccines, that your provider recommends. Ask your provider about a flu shot and a pneumonia shot. If you need home oxygen therapy, ask your provider how often to check your oxygen level with a device called an oximeter. Keep all follow-up visits to review your COPD action plan. Your provider will want to check on your condition often to keep you healthy and out of the hospital. Contact a health care provider if: You are coughing up more mucus than usual. There is a change in the color or thickness of the mucus. It is harder to breathe than usual or you are short of breath while you are resting. You need to use your quick relief inhaler more often. You have trouble doing your normal activities such as getting dressed or walking in the house. Your skin color or fingernails turn blue. You have a fever or chills. Get help right away if: You are short of breath and cannot: Talk in full sentences. Do normal  activities. You have chest pain. You feel confused. These symptoms may be an emergency. Call 911 right away. Do not wait to see if the symptoms will go away. Do not drive yourself to the hospital. This information is not intended to replace advice given to you by your health care provider. Make sure you discuss any questions you have with your health care provider. Document Revised: 03/02/2023 Document Reviewed: 08/15/2022 Elsevier Patient Education  2024 Elsevier Inc.Please see education materials related to COPD provided as Financial risk analyst.   Patient verbalizes understanding of instructions and care plan provided today and agrees to view in MyChart. Active MyChart status and patient understanding of how to access instructions and care plan via MyChart confirmed with patient.

## 2023-10-04 NOTE — Progress Notes (Signed)
  Subjective:  Patient ID: Luis [name], male    DOB: [birth year],   MRN: [id number]  No chief complaint on file.   63 y.o. male presents for concern of thickened elongated and painful nails that are difficult to trim. Requesting to have them trimmed today. Relates burning and tingling in their feet. Patient is diabetic and last A1c was  Lab Results  Component Value Date   HGBA1C 7.3 (H) 08/06/2023   .  Relates neuropathy symptoms doing well.   PCP:  Zorita Hiss, NP    . Denies any other pedal complaints. Denies n/v/f/c.   Past Medical History:  Diagnosis Date   Acute respiratory failure with hypoxia (HCC) 12/23/2022   Alcohol withdrawal syndrome, with delirium (HCC) 12/27/2022   COPD (chronic obstructive pulmonary disease) (HCC)    Diabetes mellitus without complication (HCC)    Diverticulitis    DVT (deep venous thrombosis) (HCC)    x3   Encephalopathy acute 12/28/2022   ETOH abuse    H/O colectomy    Hyperlipidemia    Hypertension    Sepsis associated hypotension (HCC) 08/03/2019   Smoker     Objective:  Physical Exam: Vascular: DP/PT pulses 2/4 bilateral. CFT <3 seconds. Absent hair growth on digits. Edema noted to bilateral lower extremities. Xerosis noted bilaterally.  Skin. No lacerations or abrasions bilateral feet. Nails 1-5 bilateral  are thickened discolored and elongated with subungual debris.  Musculoskeletal: MMT 5/5 bilateral lower extremities in DF, PF, Inversion and Eversion. Deceased ROM in DF of ankle joint.  Neurological: Sensation intact to light touch. Protective sensation diminished bilateral.    Assessment:   1. Pain due to onychomycosis of toenails of both feet   2. Type 2 diabetes mellitus with diabetic polyneuropathy, without long-term current use of insulin  (HCC)      Plan:  Patient was evaluated and treated and all questions answered. -Discussed and educated patient on diabetic foot care, especially with  regards to the vascular,  neurological and musculoskeletal systems.  -Stressed the importance of good glycemic control and the detriment of not  controlling glucose levels in relation to the foot. -Discussed supportive shoes at all times and checking feet regularly.  -Mechanically debrided all nails 1-5 bilateral using sterile nail nipper and filed with dremel without incident  -Awaiting DM shoes  -Continue gabapentin .    -Answered all patient questions -Patient to return  in 3 months for at risk foot care -Patient advised to call the office if any problems or questions arise in the meantime.   Jennefer Moats, DPM

## 2023-10-05 ENCOUNTER — Ambulatory Visit (INDEPENDENT_AMBULATORY_CARE_PROVIDER_SITE_OTHER): Admitting: Nurse Practitioner

## 2023-10-05 ENCOUNTER — Other Ambulatory Visit (HOSPITAL_COMMUNITY): Payer: Self-pay

## 2023-10-05 ENCOUNTER — Other Ambulatory Visit: Payer: Self-pay | Admitting: Internal Medicine

## 2023-10-05 ENCOUNTER — Other Ambulatory Visit: Payer: Self-pay

## 2023-10-05 VITALS — BP 126/78 | HR 65 | Temp 97.9°F | Ht 73.0 in | Wt 233.5 lb

## 2023-10-05 DIAGNOSIS — R350 Frequency of micturition: Secondary | ICD-10-CM | POA: Diagnosis not present

## 2023-10-05 DIAGNOSIS — Z7984 Long term (current) use of oral hypoglycemic drugs: Secondary | ICD-10-CM

## 2023-10-05 DIAGNOSIS — R21 Rash and other nonspecific skin eruption: Secondary | ICD-10-CM | POA: Diagnosis not present

## 2023-10-05 DIAGNOSIS — I5042 Chronic combined systolic (congestive) and diastolic (congestive) heart failure: Secondary | ICD-10-CM

## 2023-10-05 DIAGNOSIS — E1142 Type 2 diabetes mellitus with diabetic polyneuropathy: Secondary | ICD-10-CM

## 2023-10-05 DIAGNOSIS — F17200 Nicotine dependence, unspecified, uncomplicated: Secondary | ICD-10-CM

## 2023-10-05 DIAGNOSIS — Z7689 Persons encountering health services in other specified circumstances: Secondary | ICD-10-CM | POA: Diagnosis not present

## 2023-10-05 DIAGNOSIS — J449 Chronic obstructive pulmonary disease, unspecified: Secondary | ICD-10-CM | POA: Diagnosis not present

## 2023-10-05 DIAGNOSIS — I10 Essential (primary) hypertension: Secondary | ICD-10-CM | POA: Diagnosis not present

## 2023-10-05 LAB — URINALYSIS, ROUTINE W REFLEX MICROSCOPIC
Bilirubin Urine: NEGATIVE
Hgb urine dipstick: NEGATIVE
Ketones, ur: NEGATIVE
Leukocytes,Ua: NEGATIVE
Nitrite: NEGATIVE
RBC / HPF: NONE SEEN (ref 0–?)
Specific Gravity, Urine: 1.01 (ref 1.000–1.030)
Total Protein, Urine: NEGATIVE
Urine Glucose: 100 — AB
Urobilinogen, UA: 0.2 (ref 0.0–1.0)
WBC, UA: NONE SEEN (ref 0–?)
pH: 6 (ref 5.0–8.0)

## 2023-10-05 LAB — C-REACTIVE PROTEIN: CRP: 2.4 mg/dL (ref 0.5–20.0)

## 2023-10-05 LAB — BASIC METABOLIC PANEL WITH GFR
BUN: 56 mg/dL — ABNORMAL HIGH (ref 6–23)
CO2: 27 meq/L (ref 19–32)
Calcium: 10.9 mg/dL — ABNORMAL HIGH (ref 8.4–10.5)
Chloride: 95 meq/L — ABNORMAL LOW (ref 96–112)
Creatinine, Ser: 1.58 mg/dL — ABNORMAL HIGH (ref 0.40–1.50)
GFR: 46.05 mL/min — ABNORMAL LOW (ref >60.00)
Glucose, Bld: 158 mg/dL — ABNORMAL HIGH (ref 70–99)
Potassium: 5.5 meq/L — ABNORMAL HIGH (ref 3.5–5.1)
Sodium: 134 meq/L — ABNORMAL LOW (ref 135–145)

## 2023-10-05 LAB — CBC
HCT: 43.7 % (ref 39.0–52.0)
Hemoglobin: 14.6 g/dL (ref 13.0–17.0)
MCHC: 33.3 g/dL (ref 30.0–36.0)
MCV: 104.5 fl — ABNORMAL HIGH (ref 78.0–100.0)
Platelets: 293 10*3/uL (ref 150.0–400.0)
RBC: 4.19 Mil/uL — ABNORMAL LOW (ref 4.22–5.81)
RDW: 15.2 % (ref 11.5–15.5)
WBC: 12.5 10*3/uL — ABNORMAL HIGH (ref 4.0–10.5)

## 2023-10-05 LAB — SEDIMENTATION RATE: Sed Rate: 34 mm/h — ABNORMAL HIGH (ref 0–20)

## 2023-10-05 LAB — PSA: PSA: 0.78 ng/mL (ref 0.10–4.00)

## 2023-10-05 MED ORDER — GUAIFENESIN ER 600 MG PO TB12
600.0000 mg | ORAL_TABLET | Freq: Two times a day (BID) | ORAL | 1 refills | Status: DC | PRN
  Filled 2023-10-05: qty 100, 50d supply, fill #0

## 2023-10-05 NOTE — Assessment & Plan Note (Signed)
 He is encouraged to quit smoking as he plans to do so.  He does have nicotine  patches available and reports he may try this.  He was educated not to smoke cigarette the same time as nicotine  patches in place to prevent nicotine  overdose.  He reports his understanding.

## 2023-10-05 NOTE — Assessment & Plan Note (Signed)
 Chronic, stable, blood pressure at goal today Continue current antihypertensive regimen with amlodipine  10 mg daily, Imdur  30 mg daily, losartan  25 mg daily, metoprolol  25 mg daily, spironolactone  12.5 mg daily, torsemide  40 mg daily, Farxiga  10 mg daily.  Check metabolic panel, further recommendations may be made based upon these results.

## 2023-10-05 NOTE — Patient Instructions (Signed)
 Fasting blood sugar goal is 80-130

## 2023-10-05 NOTE — Assessment & Plan Note (Signed)
 Chronic Continue follow-up with pulmonology as scheduled He will try the Stiolto inhaler once it comes to him in the mail.  He would like to try pulmonary rehab to help improve endurance and energy levels, referral today.

## 2023-10-05 NOTE — Assessment & Plan Note (Signed)
 Chronic A1c slightly above goal Discussed dietary adherence and recommendation to check fasting blood sugar daily.  He reports his understanding. Check A1c at next lab draw.

## 2023-10-05 NOTE — Assessment & Plan Note (Signed)
 Chronic, euvolemic on exam today Continue current treatment plan with amlodipine  10 mg daily, Farxiga  10 mg daily, Imdur  30 mg daily, losartan  25 mg daily, metoprolol  25 mg daily, spironolactone  12.5 mg daily, torsemide  40 mg daily.  Follow-up with cardiology as scheduled.

## 2023-10-05 NOTE — Progress Notes (Signed)
 Established Patient Office Visit  Subjective   Patient ID: Luis [name], male    DOB: [birth year]  Age: 63 y.o. MRN: [id number]  Chief Complaint  Patient presents with   Medical Management of Chronic Issues   Hypertensive heart disease with CHF: Continues to follow with cardiology.  Reports he feels well overall today, does feel a bit fatigued at times but generally is stable.  Breathing well, no worse shortness of breath, no swelling, no chest pain.  Current medication management with amlodipine  10 mg daily, Farxiga  10 mg daily, Imdur  30 mg daily, losartan  25 mg daily, metoprolol  25 mg daily, spironolactone  12.5 mg daily, torsemide  40 mg daily.  COPD: Continues to follow-up with pulmonology.  Recently changed from Flovent  inhaler to Stiolto inhaler.  Has had hospitalizations for pneumonia frequently throughout the last year.  Waiting for his new inhaler to come to him in the mail.  Not oxygen dependent currently.  Type 2 diabetes with polyneuropathy: Last A1c 7.3.  Does not check blood sugars at home.  Currently on Farxiga  10 mg daily and metformin  1000 mg in the a.m. and 500 mg in the evening.  Takes gabapentin  300 mg twice daily, wonders if he can increase the dose.  Tobacco dependence: Continues to smoke cigarettes, 1 to 2 cigarettes/day.  Has about 2 packs left and plans on quitting once he has finished these packs.      Review of Systems  Constitutional:  Positive for malaise/fatigue.  Respiratory:  Positive for cough and sputum production. Negative for shortness of breath and wheezing.   Cardiovascular:  Negative for chest pain, palpitations and leg swelling.      Objective:     BP 126/78   Pulse 65   Temp 97.9 F (36.6 C) (Temporal)   Ht 6\' 1"  (1.854 m)   Wt 233 lb 8 oz (105.9 kg)   SpO2 95%   BMI 30.81 kg/m  BP Readings from Last 3 Encounters:  10/05/23 126/78  10/03/23 135/77  09/19/23 125/67   Wt Readings from Last 3 Encounters:  10/05/23 233 lb 8 oz (105.9  kg)  10/04/23 233 lb (105.7 kg)  10/03/23 235 lb 3.2 oz (106.7 kg)      Physical Exam Vitals reviewed.  Constitutional:      Appearance: Normal appearance.  HENT:     Head: Normocephalic and atraumatic.  Cardiovascular:     Rate and Rhythm: Normal rate and regular rhythm.  Pulmonary:     Effort: Pulmonary effort is normal.     Breath sounds: Normal breath sounds.  Musculoskeletal:     Cervical back: Neck supple.  Skin:    General: Skin is warm and dry.  Neurological:     Mental Status: He is alert and oriented to person, place, and time.  Psychiatric:        Mood and Affect: Mood normal.        Behavior: Behavior normal.        Thought Content: Thought content normal.        Judgment: Judgment normal.      No results found for any visits on 10/05/23.    The ASCVD Risk score (Arnett DK, et al., 2019) failed to calculate for the following reasons:   The valid total cholesterol range is 130 to 320 mg/dL    Assessment & Plan:   Problem List Items Addressed This Visit       Cardiovascular and Mediastinum   Essential hypertension (Chronic)   Chronic, stable,  blood pressure at goal today Continue current antihypertensive regimen with amlodipine  10 mg daily, Imdur  30 mg daily, losartan  25 mg daily, metoprolol  25 mg daily, spironolactone  12.5 mg daily, torsemide  40 mg daily, Farxiga  10 mg daily.  Check metabolic panel, further recommendations may be made based upon these results.      Chronic combined systolic and diastolic heart failure (HCC)   Chronic, euvolemic on exam today Continue current treatment plan with amlodipine  10 mg daily, Farxiga  10 mg daily, Imdur  30 mg daily, losartan  25 mg daily, metoprolol  25 mg daily, spironolactone  12.5 mg daily, torsemide  40 mg daily.  Follow-up with cardiology as scheduled.        Respiratory   Chronic obstructive pulmonary disease (HCC) - Primary   Chronic Continue follow-up with pulmonology as scheduled He will try the  Stiolto inhaler once it comes to him in the mail.  He would like to try pulmonary rehab to help improve endurance and energy levels, referral today.      Relevant Medications   guaiFENesin  (MUCINEX ) 600 MG 12 hr tablet   Other Relevant Orders   AMB referral to pulmonary rehabilitation     Endocrine   Type 2 diabetes mellitus with diabetic polyneuropathy, without long-term current use of insulin  (HCC)   Chronic A1c slightly above goal Discussed dietary adherence and recommendation to check fasting blood sugar daily.  He reports his understanding. Check A1c at next lab draw.        Musculoskeletal and Integument   Rash     Other   Tobacco dependence (Chronic)   He is encouraged to quit smoking as he plans to do so.  He does have nicotine  patches available and reports he may try this.  He was educated not to smoke cigarette the same time as nicotine  patches in place to prevent nicotine  overdose.  He reports his understanding.      Frequency of micturition    Return in about 5 months (around 03/06/2024) for F/U with Ruhan Borak.    Zorita Hiss, NP

## 2023-10-06 ENCOUNTER — Ambulatory Visit (HOSPITAL_COMMUNITY)
Admission: RE | Admit: 2023-10-06 | Discharge: 2023-10-06 | Disposition: A | Discharge status: Home / Self Care | Source: Ambulatory Visit | Attending: Family Medicine | Admitting: Family Medicine

## 2023-10-06 ENCOUNTER — Other Ambulatory Visit: Payer: Self-pay

## 2023-10-06 ENCOUNTER — Encounter: Payer: Self-pay | Admitting: Pharmacist

## 2023-10-06 ENCOUNTER — Other Ambulatory Visit: Payer: Self-pay | Admitting: Nurse Practitioner

## 2023-10-06 ENCOUNTER — Encounter (HOSPITAL_COMMUNITY): Payer: Self-pay

## 2023-10-06 ENCOUNTER — Other Ambulatory Visit (HOSPITAL_COMMUNITY): Payer: Self-pay

## 2023-10-06 VITALS — BP 138/74 | HR 64 | Wt 237.1 lb

## 2023-10-06 DIAGNOSIS — Z720 Tobacco use: Secondary | ICD-10-CM

## 2023-10-06 DIAGNOSIS — Z79899 Other long term (current) drug therapy: Secondary | ICD-10-CM | POA: Diagnosis not present

## 2023-10-06 DIAGNOSIS — E1142 Type 2 diabetes mellitus with diabetic polyneuropathy: Secondary | ICD-10-CM | POA: Diagnosis not present

## 2023-10-06 DIAGNOSIS — E875 Hyperkalemia: Secondary | ICD-10-CM | POA: Diagnosis not present

## 2023-10-06 DIAGNOSIS — E119 Type 2 diabetes mellitus without complications: Secondary | ICD-10-CM | POA: Insufficient documentation

## 2023-10-06 DIAGNOSIS — I110 Hypertensive heart disease with heart failure: Secondary | ICD-10-CM | POA: Insufficient documentation

## 2023-10-06 DIAGNOSIS — Z7984 Long term (current) use of oral hypoglycemic drugs: Secondary | ICD-10-CM | POA: Diagnosis not present

## 2023-10-06 DIAGNOSIS — Z950 Presence of cardiac pacemaker: Secondary | ICD-10-CM | POA: Diagnosis not present

## 2023-10-06 DIAGNOSIS — F1010 Alcohol abuse, uncomplicated: Secondary | ICD-10-CM

## 2023-10-06 DIAGNOSIS — I5032 Chronic diastolic (congestive) heart failure: Secondary | ICD-10-CM | POA: Insufficient documentation

## 2023-10-06 DIAGNOSIS — I10 Essential (primary) hypertension: Secondary | ICD-10-CM | POA: Diagnosis not present

## 2023-10-06 DIAGNOSIS — Z5982 Transportation insecurity: Secondary | ICD-10-CM | POA: Diagnosis not present

## 2023-10-06 DIAGNOSIS — F17210 Nicotine dependence, cigarettes, uncomplicated: Secondary | ICD-10-CM | POA: Diagnosis not present

## 2023-10-06 LAB — URINE CULTURE: Result:: NO GROWTH

## 2023-10-06 MED ORDER — TORSEMIDE 20 MG PO TABS
80.0000 mg | ORAL_TABLET | Freq: Every day | ORAL | 3 refills | Status: DC
  Filled 2023-10-06: qty 120, 30d supply, fill #0

## 2023-10-06 NOTE — Patient Instructions (Signed)
 Medication Changes:  INCREASE TORSEMIDE  TO 80MG  ONCE DAILY   Lab Work:  PLEASE HAVE REPEAT LABS DONE AT NEXT APPOINTMENT WITH PCP  Follow-Up in: 3 WEEKS AS SCHEDULED   At the [facility], you and your health needs are our priority. We have a designated team specialized in the treatment of Heart Failure. This Care Team includes your primary Heart Failure Specialized Cardiologist (physician), Advanced Practice Providers (APPs- Physician Assistants and Nurse Practitioners), and Pharmacist who all work together to provide you with the care you need, when you need it.   You may see any of the following providers on your designated Care Team at your next follow up:  Dr. Jules Oar Dr. Peder Bourdon Dr. Alwin Baars Dr. Judyth Nunnery Nieves Bars, NP Ruddy Corral, [place] [facility] [place] Dowell, [place] Dennise Fitz, NP [place] Lee, NP Luster Salters, PharmD   Please be sure to bring in all your medications bottles to every appointment.   Need to Contact Us :  If you have any questions or concerns before your next appointment please send us  a message through [place] or call our office at [phone].    TO LEAVE A MESSAGE FOR THE NURSE SELECT OPTION 2, PLEASE LEAVE A MESSAGE INCLUDING: YOUR NAME DATE OF BIRTH CALL BACK NUMBER REASON FOR CALL**this is important as we prioritize the call backs  YOU WILL RECEIVE A CALL BACK THE SAME DAY AS LONG AS YOU CALL BEFORE 4:00 PM

## 2023-10-06 NOTE — Progress Notes (Signed)
 Advanced Heart Failure Clinic Note   PCP: Zorita Hiss, NP HF Cardiologist: Dr. Bruce Caper    HPI: Luis [name] is a 63 y.o.male with heart failure with preserved ejection fraction, complete heart block status post leadless permanent pacemaker, hypertension, hyperlipidemia, history of DVT, type 2 diabetes, tobacco use, history of alcohol use, diverticulitis status post colectomy with ileostomy complicated by wound infections.   He has had multiple hospitalizations in the past 1 year.  Admitted in 7/24 with hypoxic/hypercarbic respiratory failure secondary to pneumonia/COPD and diastolic heart failure exacerbation requiring intubation and mechanical ventilation.  He was also delirious at that time due to alcohol withdrawal.  Echo with EF 50 to 55% and grade 2 diastolic dysfunction.    Admitted 9/24 with respiratory failure 2/2 to COPD and HFpEF requiring IV steroids, antibiotics and diuresis. Since that time, he has been seen in [facility] clinic and referred to [facility], followed by Dr. Bruce Caper.   Admitted 2/25 for acute respiratory failure with hypoxia due to PNA, COPD exacerbation and a/c diastolic CHF. He was managed by internal medicine. Echo not repeated. Per hospital notes, he was aggressively diuresed, over 20 L, back to baseline. Pt had admitted to dietary indiscretion w/ sodium and not adhearing to fluid restriction at home prior to being admitted. Also admitted to drinking large amounts of alcohol. GDMT titrated and he was discharged home, weight 225 lbs.  Today he returns for HF follow up. Overall feeling fair. Coughing up phlegm and started taking mucinex . He is not SOB walking on flat ground or with ADLs. Drinking a lot of fluids. Denies palpitations, abnormal bleeding, CP, dizziness, edema, or PND/Orthopnea. Sleeps on couch. Appetite ok. Weight at home 233 pounds. Taking all medications. Labs at PCP yesterday showed K 5.5, SCr 1.58 & spiro stopped. Drinks 4 vodka drinks/day, smoking 1/2 ppd,  rare THC.  Cardiac Studies - Echo 7/24: EF 50-55%  - cMRI 2022: LVEF 70%, RV nl. +LGE noted in the mid-wall basal anteroseptum and mid-wall basal inferolateral wall, noncoronary disease pattern   Past Medical History:  Diagnosis Date   Acute respiratory failure with hypoxia (HCC) 12/23/2022   Alcohol withdrawal syndrome, with delirium (HCC) 12/27/2022   COPD (chronic obstructive pulmonary disease) (HCC)    Diabetes mellitus without complication (HCC)    Diverticulitis    DVT (deep venous thrombosis) (HCC)    x3   Encephalopathy acute 12/28/2022   ETOH abuse    H/O colectomy    Hyperlipidemia    Hypertension    Sepsis associated hypotension (HCC) 08/03/2019   Smoker    Current Outpatient Medications  Medication Sig Dispense Refill   acetaminophen  (ACETAMINOPHEN  8 HOUR) 650 MG CR tablet Take 1 tablet (650 mg total) by mouth every 8 (eight) hours as needed for pain (Patient taking differently: Take 1,300 mg by mouth every 8 (eight) hours as needed for pain or fever.) 90 tablet 1   amLODipine  (NORVASC ) 10 MG tablet Take 1 tablet (10 mg total) by mouth daily. 90 tablet 0   ascorbic acid  (VITAMIN C ) 500 MG tablet Take 1 tablet (500 mg total) by mouth daily. 30 tablet 1   atorvastatin  (LIPITOR) 10 MG tablet Take 1 tablet (10 mg total) by mouth daily. 90 tablet 1   cetirizine  (ZYRTEC ) 10 MG tablet Take 1 tablet (10 mg total) by mouth daily. 90 tablet 1   dapagliflozin  propanediol (FARXIGA ) 10 MG TABS tablet Take 1 tablet (10 mg total) by mouth daily before breakfast. 90 tablet 2   folic  acid (FOLVITE ) 1 MG tablet Take 1 tablet (1 mg total) by mouth daily. 90 tablet 0   gabapentin  (NEURONTIN ) 300 MG capsule Take 1 capsule (300 mg total) by mouth 2 (two) times daily. (Patient taking differently: Take 600 mg by mouth at bedtime.) 90 capsule 3   guaiFENesin  (MUCINEX ) 600 MG 12 hr tablet Take 1 tablet (600 mg total) by mouth 2 (two) times daily as needed to loosen phlegm or cough. 180 tablet 1    hydrOXYzine  (VISTARIL ) 50 MG capsule Take 1 capsule (50 mg total) by mouth at bedtime as needed. 30 capsule 2   ipratropium-albuterol  (DUONEB) 0.5-2.5 (3) MG/3ML SOLN Take 3 mLs by nebulization every 6 (six) hours as needed.     isosorbide  mononitrate (IMDUR ) 30 MG 24 hr tablet Take 1 tablet (30 mg total) by mouth daily. 30 tablet 0   losartan  (COZAAR ) 25 MG tablet Take 1 tablet (25 mg total) by mouth daily. 30 tablet 5   metFORMIN  (GLUCOPHAGE ) 500 MG tablet Take 2 tablets (1,000 mg total) by mouth daily with breakfast AND 1 tablet (500 mg total) daily with supper. (Patient taking differently: Take 2 tablets (1,000 mg total) by mouth daily ) 90 tablet 3   metoprolol  succinate (TOPROL -XL) 25 MG 24 hr tablet Take 1 tablet (25 mg total) by mouth daily. 90 tablet 3   Multiple Vitamin (MULTIVITAMIN WITH MINERALS) TABS tablet Take 1 tablet by mouth every other day.      nicotine  (NICODERM CQ  - DOSED IN MG/24 HOURS) 21 mg/24hr patch Place 1 patch (21 mg total) onto the skin daily. 28 patch 0   promethazine -dextromethorphan  (PROMETHAZINE -DM) 6.25-15 MG/5ML syrup Take 5 mLs by mouth 4 (four) times daily as needed. 240 mL 0   thiamine  (VITAMIN B1) 100 MG tablet Take 1 tablet (100 mg total) by mouth daily. 90 tablet 0   Tiotropium Bromide -Olodaterol 2.5-2.5 MCG/ACT AERS Inhale 2 puffs into the lungs daily. 4 g 3   torsemide  (DEMADEX ) 20 MG tablet Take 2 tablets (40 mg total) by mouth daily. 60 tablet 3   triamcinolone  cream (KENALOG ) 0.5 % Apply topically to legs twice daily for up to 2 weeks as needed for eczema 30 g 1   No current facility-administered medications for this encounter.   Allergies  Allergen Reactions   Lisinopril     Other reaction(s): renal effects   Codeine Other (See Comments) and Hives    unknown Other reaction(s): Other (See Comments) unknown unknown   Social History   Socioeconomic History   Marital status: Single    Spouse name: Not on file   Number of children: Not on  file   Years of education: Not on file   Highest education level: Not on file  Occupational History   Occupation: Pt states not able to work  Tobacco Use   Smoking status: Every Day    Current packs/day: 1.00    Average packs/day: 1 pack/day for 44.0 years (44.0 ttl pk-yrs)    Types: Cigarettes   Smokeless tobacco: Current   Tobacco comments:    Less than .5pk daily 10/03/23  Vaping Use   Vaping status: Never Used  Substance and Sexual Activity   Alcohol use: Yes    Comment: 1 drink (1 pt) 5 days per week   Drug use: Yes    Types: Marijuana   Sexual activity: Not Currently  Other Topics Concern   Not on file  Social History Narrative   Not on file   Social Drivers  of Health   Financial Resource Strain: Not on file  Food Insecurity: No Food Insecurity (09/12/2023)   Hunger Vital Sign    Worried About Running Out of Food in the Last Year: Never true    Ran Out of Food in the Last Year: Never true  Transportation Needs: Unmet Transportation Needs (09/12/2023)   PRAPARE - Transportation    Lack of Transportation (Medical): Yes    Lack of Transportation (Non-Medical): Yes  Physical Activity: Not on file  Stress: Stress Concern Present (04/18/2023)   [employer] of Occupational Health - Occupational Stress Questionnaire    Feeling of Stress : To some extent  Social Connections: Not on file  Intimate Partner Violence: Not At Risk (08/07/2023)   Humiliation, Afraid, Rape, and Kick questionnaire    Fear of Current or Ex-Partner: No    Emotionally Abused: No    Physically Abused: No    Sexually Abused: No   Family History  Problem Relation Age of Onset   COPD Mother    Prostate cancer Father    Colon cancer Neg Hx    Rectal cancer Neg Hx    Stomach cancer Neg Hx    Esophageal cancer Neg Hx    Wt Readings from Last 3 Encounters:  10/06/23 107.6 kg (237 lb 2 oz)  10/05/23 105.9 kg (233 lb 8 oz)  10/04/23 105.7 kg (233 lb)   BP 138/74   Pulse 64   Wt 107.6 kg (237  lb 2 oz)   SpO2 95%   BMI 31.28 kg/m   PHYSICAL EXAM: General:  NAD. No resp difficulty, walked into clinic, chronically-ill appearing. HEENT: Normal Neck: Supple. No JVD. Thick neck Cor: Regular rate & rhythm. No rubs, gallops or murmurs. Lungs: Clear Abdomen: Soft, obese, nontender, + distended. + ostomy and abd dressing Extremities: No cyanosis, clubbing, rash, edema Neuro: Alert & oriented x 3, moves all 4 extremities w/o difficulty. Affect pleasant.  ReDs reading: 39%, abnormal  ASSESSMENT & PLAN: Chronic Diastolic Heart Failure - cMRI in 2022 EF 70%, RV nl. +LGE noted in the mid-wall basal anteroseptum and mid-wall basal inferolateral wall, noncoronary disease pattern  - Echo 7/24 EF 50-55% RV nl  - NYHA II, volume up, REDs 39%. Weight up - Increase torsemide  to 80 mg daily. - Continue Farxiga  10 mg daily. - Continue Toprol  XL 25 mg daily  - Continue losartan  25 mg daily.  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 No mediastinal lymphadenopathy on CTs to suggest pulmonary sarcoid. However, due to his continued excessive alcohol intake and multiple wounds (large abdominal wound) he would be high risk for immunosuppressive therapy with concerns regarding compliance.  - He has PCP appt 10/11/23, will get BMET then - Update echo soon (arrange at next visit)  2. CHB s/p Leadless PPM - Following with EP    3. T2DM - Followed by PCP - Most recent A1C 7.3    4. HTN - BP up a bit - Consider increasing losartan  next visit with renal function stable   5. Abdominal wound / hernia w/ hx of ileostomy - Followed at [facility]  6. ETOH/tobacco abuse - Drinks 4 vodka drinks/day - Smoking 1/2 ppd - Discussed cessation   Follow up with APP in 3 weeks.  He is high risk for  re-admit.  Arlice Bene  [place], [place] 10/06/23

## 2023-10-06 NOTE — Progress Notes (Signed)
 ReDS Vest / Clip - 10/06/23 1200       ReDS Vest / Clip   Station Marker C    Ruler Value 31    ReDS Value Range Moderate volume overload    ReDS Actual Value 39

## 2023-10-09 ENCOUNTER — Other Ambulatory Visit (HOSPITAL_COMMUNITY): Payer: Self-pay

## 2023-10-09 ENCOUNTER — Other Ambulatory Visit: Payer: Self-pay

## 2023-10-09 ENCOUNTER — Telehealth (HOSPITAL_COMMUNITY): Payer: Self-pay

## 2023-10-09 NOTE — Telephone Encounter (Signed)
 Pt insurance is active and benefits verified through [facility] Medicaid. Co-pay $4, DED $0/$0 met, out of pocket $0/$0 met, co-insurance 0%. No pre-authorization required. 10/09/2023 @ 9:54am, REF# [id number].

## 2023-10-09 NOTE — Telephone Encounter (Signed)
 Called patient to see if he was interested in participating in the Pulmonary Rehab Program. Patient will come in for orientation on 5/14 and will attend the 1:15 exercise class.  Pensions consultant.

## 2023-10-11 ENCOUNTER — Other Ambulatory Visit: Payer: Self-pay | Admitting: Nurse Practitioner

## 2023-10-11 ENCOUNTER — Ambulatory Visit (INDEPENDENT_AMBULATORY_CARE_PROVIDER_SITE_OTHER): Admitting: Nurse Practitioner

## 2023-10-11 ENCOUNTER — Telehealth: Payer: Self-pay | Admitting: Nurse Practitioner

## 2023-10-11 ENCOUNTER — Other Ambulatory Visit: Payer: Self-pay

## 2023-10-11 VITALS — BP 116/78 | HR 75 | Temp 98.0°F | Ht 73.0 in | Wt 234.4 lb

## 2023-10-11 DIAGNOSIS — N179 Acute kidney failure, unspecified: Secondary | ICD-10-CM

## 2023-10-11 DIAGNOSIS — J449 Chronic obstructive pulmonary disease, unspecified: Secondary | ICD-10-CM

## 2023-10-11 DIAGNOSIS — R9431 Abnormal electrocardiogram [ECG] [EKG]: Secondary | ICD-10-CM

## 2023-10-11 DIAGNOSIS — I5042 Chronic combined systolic (congestive) and diastolic (congestive) heart failure: Secondary | ICD-10-CM | POA: Diagnosis not present

## 2023-10-11 LAB — COMPREHENSIVE METABOLIC PANEL WITH GFR
ALT: 36 U/L (ref 0–53)
AST: 27 U/L (ref 0–37)
Albumin: 5 g/dL (ref 3.5–5.2)
Alkaline Phosphatase: 162 U/L — ABNORMAL HIGH (ref 39–117)
BUN: 80 mg/dL — ABNORMAL HIGH (ref 6–23)
CO2: 30 meq/L (ref 19–32)
Calcium: 10.7 mg/dL — ABNORMAL HIGH (ref 8.4–10.5)
Chloride: 91 meq/L — ABNORMAL LOW (ref 96–112)
Creatinine, Ser: 2.31 mg/dL — ABNORMAL HIGH (ref 0.40–1.50)
GFR: 29.19 mL/min — ABNORMAL LOW (ref >60.00)
Glucose, Bld: 212 mg/dL — ABNORMAL HIGH (ref 70–99)
Potassium: 4.1 meq/L (ref 3.5–5.1)
Sodium: 134 meq/L — ABNORMAL LOW (ref 135–145)
Total Bilirubin: 0.4 mg/dL (ref 0.2–1.2)
Total Protein: 8.5 g/dL — ABNORMAL HIGH (ref 6.0–8.3)

## 2023-10-11 MED ORDER — GUAIFENESIN ER 600 MG PO TB12
600.0000 mg | ORAL_TABLET | Freq: Two times a day (BID) | ORAL | 1 refills | Status: DC | PRN
  Filled 2023-10-11: qty 180, 90d supply, fill #0
  Filled 2023-10-13: qty 60, 30d supply, fill #0
  Filled 2023-12-17: qty 60, 30d supply, fill #1
  Filled 2024-01-24 – 2024-01-25 (×3): qty 60, 30d supply, fill #2
  Filled 2024-03-08: qty 60, 30d supply, fill #3
  Filled 2024-04-04: qty 60, 30d supply, fill #4
  Filled 2024-05-15: qty 60, 30d supply, fill #5

## 2023-10-11 NOTE — Progress Notes (Signed)
 Established Patient Office Visit  Subjective   Patient ID: Luis [name], male    DOB: 02-13-1961  Age: 63 y.o. MRN: [id number]  Chief Complaint  Patient presents with   hyperkalemia    Hypokalemia: Incidental finding on last labs.  Patient was told to stop spironolactone , he returns today for follow-up. Patient has heart failure, he sees providers the heart failure clinic as well as his cardiologist routinely.  Reports no chest pain or cardiac palpitations.  Has been found to be in a flutter in the past but was determined that risk of anticoagulation outweighed benefit so he is not anticoagulated.  Was seen at heart failure clinic about 5 days ago at which point torsemide  was increased.   Today, he reports he is feeling stable, no excessive swelling or difficulty breathing.  No chest pain or cardiac palpitations.    Review of Systems  Cardiovascular:  Negative for chest pain, orthopnea and leg swelling.   Estimated Creatinine Clearance: 60.5 mL/min (A) (by C-G formula based on SCr of 1.58 mg/dL (H)).    Objective:     BP 116/78   Pulse 75   Temp 98 F (36.7 C) (Temporal)   Ht 6\' 1"  (1.854 m)   Wt 234 lb 6 oz (106.3 kg)   SpO2 98%   BMI 30.92 kg/m    Physical Exam Vitals reviewed.  Constitutional:      Appearance: Normal appearance.  HENT:     Head: Normocephalic and atraumatic.  Cardiovascular:     Rate and Rhythm: Normal rate and regular rhythm.  Pulmonary:     Effort: Pulmonary effort is normal.     Breath sounds: Normal breath sounds.  Musculoskeletal:     Cervical back: Neck supple.  Skin:    General: Skin is warm and dry.  Neurological:     Mental Status: He is alert and oriented to person, place, and time.  Psychiatric:        Mood and Affect: Mood normal.        Behavior: Behavior normal.        Thought Content: Thought content normal.        Judgment: Judgment normal.    EKG: Atrial flutter, left bundle branch block, QT prolongation with QTc at  490, heart rate 60  No results found for any visits on 10/11/23.    The ASCVD Risk score (Arnett DK, et al., 2019) failed to calculate for the following reasons:   The valid total cholesterol range is 130 to 320 mg/dL    Assessment & Plan:   Problem List Items Addressed This Visit       Cardiovascular and Mediastinum   Chronic combined systolic and diastolic heart failure (HCC) - Primary   Chronic, appears euvolemic on exam today Continue current treatment plan (amlodipine  10mg /day, farxiga  10mg /day, imdur  30mg /day, metoprolol  25mg /day, torsemide  80mg /day. EKG collected today and reviewed with backup supervising physician, does appear that T waves may be peaked, he continues to be in atrial flutter with heart rate controlled.  Will check stat metabolic panel, further conditions may be made based upon these results.      Relevant Orders   Comprehensive metabolic panel with GFR     Respiratory   Chronic obstructive pulmonary disease (HCC)   Patient requesting prescription for Mucinex  to help with expectorating sputum, prescription sent to pharmacy.      Relevant Medications   guaiFENesin  (MUCINEX ) 600 MG 12 hr tablet     Other   Long  QT interval   Identified on EKG today. Discontinue promethazine  dextromethorphan  cough syrup. Has had long QTc's in the past as well, EKG from 06/02/2023 did identify a QTc of 516. Patient to continue to follow-up with cardiology and heart failure clinic.        Return for As scheduled.  Total time spent on encounter was 30 minutes including face-to-face evaluation, review of previous medical records, discussion with backup supervising physician, and development/discussion of treatment plan.   Zorita Hiss, NP

## 2023-10-11 NOTE — Telephone Encounter (Signed)
 Called pt and made him aware of providers note, pt is schedule to come in for office visit on 10/20/23

## 2023-10-11 NOTE — Telephone Encounter (Signed)
 Estimated Creatinine Clearance: 41.4 mL/min (A) (by C-G formula based on SCr of 2.31 mg/dL (H)).  Please call patient and let him know that his potassium level has actually dramatically improved.  However his kidney function has dropped, there is evidence that he is likely volume depleted and possibly dehydrated.  He needs to STOP his torsemide  x 3 days, then resume at 40 mg/day.  He needs to also STOP his Farxiga  and metformin .  He needs to come back to the office on Friday.  I would prefer to see him in person to monitor vital signs and swelling, but he needs to at least come in for repeat labs to see if his kidney function improves.  He should NOT drink excessive amounts of water/fluid over the next couple of days as we do not want him to become fluid overloaded either.  Please let me know if he has any questions.

## 2023-10-11 NOTE — Assessment & Plan Note (Addendum)
 Identified on EKG today. Discontinue promethazine  dextromethorphan  cough syrup. Has had long QTc's in the past as well, EKG from 06/02/2023 did identify a QTc of 516. Patient to continue to follow-up with cardiology and heart failure clinic.

## 2023-10-11 NOTE — Assessment & Plan Note (Signed)
 Chronic, appears euvolemic on exam today Continue current treatment plan (amlodipine  10mg /day, farxiga  10mg /day, imdur  30mg /day, metoprolol  25mg /day, torsemide  80mg /day. EKG collected today and reviewed with backup supervising physician, does appear that T waves may be peaked, he continues to be in atrial flutter with heart rate controlled.  Will check stat metabolic panel, further conditions may be made based upon these results.

## 2023-10-11 NOTE — Assessment & Plan Note (Addendum)
 Patient requesting prescription for Mucinex  to help with expectorating sputum, prescription sent to pharmacy.

## 2023-10-11 NOTE — Telephone Encounter (Signed)
 I did review patient's labs and treatment plan recommendations based on labs with my supervising physician who is in agreements.

## 2023-10-12 ENCOUNTER — Telehealth: Payer: Self-pay

## 2023-10-12 ENCOUNTER — Other Ambulatory Visit: Payer: Self-pay | Admitting: *Deleted

## 2023-10-12 ENCOUNTER — Other Ambulatory Visit: Payer: Self-pay

## 2023-10-12 ENCOUNTER — Other Ambulatory Visit: Payer: Self-pay | Admitting: Nurse Practitioner

## 2023-10-12 MED ORDER — TORSEMIDE 20 MG PO TABS: 40.0000 mg | ORAL_TABLET | Freq: Every day | ORAL | Status: DC

## 2023-10-12 NOTE — Telephone Encounter (Signed)
 Copied from CRM [id number]. Topic: Clinical - Medication Question >> Oct 12, 2023  8:24 AM Luis [name] wrote: Reason for CRM: Patient wanted to let a nurse and Adella Agee know that he has stopped taking his multivitamin

## 2023-10-12 NOTE — Addendum Note (Signed)
 Addended by: Zorita Hiss on: 10/12/2023 09:28 AM   Modules accepted: Orders

## 2023-10-13 ENCOUNTER — Other Ambulatory Visit (INDEPENDENT_AMBULATORY_CARE_PROVIDER_SITE_OTHER)

## 2023-10-13 ENCOUNTER — Other Ambulatory Visit (HOSPITAL_COMMUNITY): Payer: Self-pay

## 2023-10-13 ENCOUNTER — Other Ambulatory Visit: Payer: Self-pay

## 2023-10-13 DIAGNOSIS — N179 Acute kidney failure, unspecified: Secondary | ICD-10-CM | POA: Diagnosis not present

## 2023-10-13 DIAGNOSIS — Z7689 Persons encountering health services in other specified circumstances: Secondary | ICD-10-CM | POA: Diagnosis not present

## 2023-10-13 LAB — BASIC METABOLIC PANEL WITH GFR
BUN: 62 mg/dL — ABNORMAL HIGH (ref 6–23)
CO2: 29 meq/L (ref 19–32)
Calcium: 10.4 mg/dL (ref 8.4–10.5)
Chloride: 97 meq/L (ref 96–112)
Creatinine, Ser: 1.47 mg/dL (ref 0.40–1.50)
GFR: 50.21 mL/min — ABNORMAL LOW (ref >60.00)
Glucose, Bld: 184 mg/dL — ABNORMAL HIGH (ref 70–99)
Potassium: 4.9 meq/L (ref 3.5–5.1)
Sodium: 135 meq/L (ref 135–145)

## 2023-10-13 NOTE — Patient Outreach (Signed)
 Complex Care Management   Visit Note  10/13/2023 late entry  Name:  Luis [name] MRN: [id number] DOB: 1960/07/10  Situation: Referral received for Complex Care Management related to  [place] care needs  I obtained verbal consent from Patient.  Visit completed with patient  on the phone on 10/12/23.  Background:   Past Medical History:  Diagnosis Date   Acute respiratory failure with hypoxia (HCC) 12/23/2022   Alcohol withdrawal syndrome, with delirium (HCC) 12/27/2022   COPD (chronic obstructive pulmonary disease) (HCC)    Diabetes mellitus without complication (HCC)    Diverticulitis    DVT (deep venous thrombosis) (HCC)    x3   Encephalopathy acute 12/28/2022   ETOH abuse    H/O colectomy    Hyperlipidemia    Hypertension    Sepsis associated hypotension (HCC) 08/03/2019   Smoker     Assessment: Patient Reported Symptoms:  Cognitive Cognitive Status: Alert and oriented to person, place, and time, Insightful and able to interpret abstract concepts, Normal speech and language skills Cognitive/Intellectual Conditions Management [RPT]: None reported or documented in medical history or problem list      Neurological Neurological Review of Symptoms: Not assessed    HEENT HEENT Symptoms Reported: No symptoms reported      Cardiovascular Cardiovascular Symptoms Reported: Fatigue Does patient have uncontrolled Hypertension?: No Cardiovascular Conditions: Hypertension, Heart failure Cardiovascular Management Strategies: Medication therapy, Routine screening Weight: 234 lb (106.1 kg)  Respiratory Respiratory Symptoms Reported: Productive cough Other Respiratory Symptoms: reports that cough has improved Respiratory Conditions: COPD Respiratory Comment: Pulmonologist prescribed promethazin 4x per day started 4/30 for phlegm and cough-starts pulmonary rehab next month  Endocrine Patient reports the following symptoms related to hypoglycemia or hyperglycemia : Not assessed     Gastrointestinal Gastrointestinal Symptoms Reported: Not assessed      Genitourinary Genitourinary Symptoms Reported: Not assessed    Integumentary Integumentary Symptoms Reported: Not assessed    Musculoskeletal Musculoskelatal Symptoms Reviewed: Difficulty walking Additional Musculoskeletal Details: neuropathy in feet Musculoskeletal Management Strategies: Medication therapy, Routine screening Musculoskeletal Self-Management Outcome: 3 (uncertain) Falls in the past year?: No Number of falls in past year: 1 or less Was there an injury with Fall?: No Fall Risk Category Calculator: 0 Patient Fall Risk Level: Low Fall Risk Patient at Risk for Falls Due to: Impaired mobility  Psychosocial Psychosocial Symptoms Reported: Depression - if selected complete PHQ 2-9 Additional Psychological Details: Patient confirmed having depression related to current medical condtion-declines coordination of ongoing   mental health follow up but would like to consider personal care services to assist with his ADL's Behavioral Health Conditions: Depression Behavioral Management Strategies: Adequate rest, Coping strategies Behavioral Health Self-Management Outcome: 3 (uncertain) Behavioral Health Comment: ongoing mental health follow up encouraged Major Change/Loss/Stressor/Fears (CP): Medical condition, self Behaviors When Feeling Stressed/Fearful: music, cooking, gardening Techniques to [employer] with Loss/Stress/Change: Diversional activities Quality of Family Relationships: supportive (son has plans to move out of stated) Do you feel physically threatened by others?: No      10/12/2023    1:56 PM  Depression screen PHQ 2/9  Difficult doing work/chores Very difficult    There were no vitals filed for this visit.  Medications Reviewed Today     Reviewed by Ave Leisure, LCSW (Social Worker) on 10/13/23 at 1456  Med List Status: <None>   Medication Order Taking? Sig Documenting Provider Last Dose  Status Informant  acetaminophen  (ACETAMINOPHEN  8 HOUR) 650 MG CR tablet [id number] Yes Take 1 tablet (650 mg total) by  mouth every 8 (eight) hours as needed for pain  Patient taking differently: Take 1,300 mg by mouth every 8 (eight) hours as needed for pain or fever.    Taking Active Self, Pharmacy Records           Med Note Lajuana Pilar, Jacky Massing   Tue Sep 12, 2023  1:49 PM) Reports takes 1200 mg at 6am, 1200mg  at 12N, 1200mg  at 6pm and 1200mg  again.  amLODipine  (NORVASC ) 10 MG tablet [id number] Yes Take 1 tablet (10 mg total) by mouth daily. Zorita Hiss, NP Taking Active   ascorbic acid  (VITAMIN C ) 500 MG tablet [id number] No Take 1 tablet (500 mg total) by mouth daily.  Patient not taking: Reported on 10/12/2023    Not Taking Active Self, Pharmacy Records  atorvastatin  (LIPITOR) 10 MG tablet [id number] Yes Take 1 tablet (10 mg total) by mouth daily. Zorita Hiss, NP Taking Active   cetirizine  (ZYRTEC ) 10 MG tablet [id number] Yes Take 1 tablet (10 mg total) by mouth daily. Zorita Hiss, NP Taking Active Self, Pharmacy Records  dapagliflozin  propanediol (FARXIGA ) 10 MG TABS tablet [id number] No Take 1 tablet (10 mg total) by mouth daily before breakfast.  Patient not taking: Reported on 10/12/2023   Zorita Hiss, NP Not Taking Active Self, Pharmacy Records  folic acid  (FOLVITE ) 1 MG tablet [id number] Yes Take 1 tablet (1 mg total) by mouth daily. Zorita Hiss, NP Taking Active   gabapentin  (NEURONTIN ) 300 MG capsule [id number] Yes Take 1 capsule (300 mg total) by mouth 2 (two) times daily.  Patient taking differently: Take 600 mg by mouth at bedtime.   Sikora, Rebecca, DPM Taking Active Self, Pharmacy Records           Med Note Lajuana Pilar, JUANA M   Tue Sep 12, 2023  1:43 PM) Reports taking 300 mg in am and 600 mg at night.  guaiFENesin  (MUCINEX ) 600 MG 12 hr tablet [id number] Yes Take 1 tablet (600 mg total) by mouth 2 (two) times daily as needed to loosen phlegm or cough. Zorita Hiss, NP Taking Active             Med Note Dimitri France, Aniyiah Zell M   Thu Oct 12, 2023  1:18 PM) Need re-fill   hydrOXYzine  (VISTARIL ) 50 MG capsule [id number] Yes Take 1 capsule (50 mg total) by mouth at bedtime as needed. Zorita Hiss, NP Taking Active     Discontinued 03/15/23 1105 (Change in therapy)   ipratropium-albuterol  (DUONEB) 0.5-2.5 (3) MG/3ML SOLN [id number] No Take 3 mLs by nebulization every 6 (six) hours as needed.  Patient not taking: Reported on 10/12/2023   [provider] Not Taking Active Self, Pharmacy Records  isosorbide  mononitrate (IMDUR ) 30 MG 24 hr tablet [id number] Yes Take 1 tablet (30 mg total) by mouth daily. Cala Castleman, MD Taking Active   losartan  (COZAAR ) 25 MG tablet [id number] Yes Take 1 tablet (25 mg total) by mouth daily. Alwin Baars, DO Taking Active Self, Pharmacy Records  metFORMIN  (GLUCOPHAGE ) 500 MG tablet [id number] No Take 2 tablets (1,000 mg total) by mouth daily with breakfast AND 1 tablet (500 mg total) daily with supper.  Patient not taking: Reported on 10/12/2023   Zorita Hiss, NP Not Taking Active Self, Pharmacy Records  metoprolol  succinate (TOPROL -XL) 25 MG 24 hr tablet [id number] Yes Take 1 tablet (25 mg total) by mouth daily. Sabharwal, Aditya, DO Taking Active Self, Pharmacy Records  nicotine  (NICODERM CQ  - DOSED IN MG/24 HOURS)  21 mg/24hr patch [id number] No Place 1 patch (21 mg total) onto the skin daily.  Patient not taking: Reported on 10/12/2023   Cala Castleman, MD Not Taking Active   thiamine  (VITAMIN B1) 100 MG tablet [id number] Yes Take 1 tablet (100 mg total) by mouth daily.  Taking Active Self, Pharmacy Records  Tiotropium Bromide -Olodaterol 2.5-2.5 MCG/ACT AERS [id number] Yes Inhale 2 puffs into the lungs daily. Antonio Baumgarten, NP Taking Active   torsemide  (DEMADEX ) 20 MG tablet [id number] Yes Take 2 tablets (40 mg total) by mouth daily. Zorita Hiss, NP Taking Active   triamcinolone  cream (KENALOG ) 0.5 % [id number] Yes Apply topically to legs  twice daily for up to 2 weeks as needed for eczema Zorita Hiss, NP Taking Active             Recommendation:   Labs 10/13/23 PCP follow up 10/20/23  Follow Up Plan:   Telephone follow-up 10/31/23  Michaelle Adolphus, LCSW [place]  [facility] Licensed Clinical Social Worker Care Coordinator  Direct Dial: [phone]

## 2023-10-16 ENCOUNTER — Telehealth: Payer: Self-pay | Admitting: Nurse Practitioner

## 2023-10-16 NOTE — Telephone Encounter (Unsigned)
 Copied from CRM [id number]. Topic: Clinical - Medical Advice >> Oct 16, 2023 11:35 AM Adaysia C wrote: Reason for CRM: Patient called to ask the PCP if he needs to continue taking dapagliflozin  propanediol (FARXIGA ) 10 MG TABS tablet and metFORMIN  (GLUCOPHAGE ) 500 MG tablet while taking the torsemide  (DEMADEX ) 40 MG/day; patient has requested a follow up call #[phone](H) [phone](M)

## 2023-10-17 ENCOUNTER — Ambulatory Visit (HOSPITAL_COMMUNITY)
Admission: RE | Admit: 2023-10-17 | Discharge: 2023-10-17 | Disposition: A | Discharge status: Home / Self Care | Source: Ambulatory Visit | Attending: Nurse Practitioner | Admitting: Nurse Practitioner

## 2023-10-17 DIAGNOSIS — T8189XA Other complications of procedures, not elsewhere classified, initial encounter: Secondary | ICD-10-CM | POA: Diagnosis not present

## 2023-10-17 DIAGNOSIS — L24B3 Irritant contact dermatitis related to fecal or urinary stoma or fistula: Secondary | ICD-10-CM | POA: Diagnosis not present

## 2023-10-17 DIAGNOSIS — K435 Parastomal hernia without obstruction or  gangrene: Secondary | ICD-10-CM | POA: Insufficient documentation

## 2023-10-17 DIAGNOSIS — Z432 Encounter for attention to ileostomy: Secondary | ICD-10-CM

## 2023-10-17 DIAGNOSIS — Y839 Surgical procedure, unspecified as the cause of abnormal reaction of the patient, or of later complication, without mention of misadventure at the time of the procedure: Secondary | ICD-10-CM | POA: Diagnosis not present

## 2023-10-17 DIAGNOSIS — Z7689 Persons encountering health services in other specified circumstances: Secondary | ICD-10-CM | POA: Diagnosis not present

## 2023-10-17 DIAGNOSIS — Z932 Ileostomy status: Secondary | ICD-10-CM | POA: Diagnosis present

## 2023-10-17 NOTE — Progress Notes (Signed)
 [facility]   Reason for visit:  RLQ ileosotmy Nonhealing abdominal surgical wound Ventral hernia HPI:  Perforated diverticulum with end ileostomy Past Medical History:  Diagnosis Date   Acute respiratory failure with hypoxia (HCC) 12/23/2022   Alcohol withdrawal syndrome, with delirium (HCC) 12/27/2022   COPD (chronic obstructive pulmonary disease) (HCC)    Diabetes mellitus without complication (HCC)    Diverticulitis    DVT (deep venous thrombosis) (HCC)    x3   Encephalopathy acute 12/28/2022   ETOH abuse    H/O colectomy    Hyperlipidemia    Hypertension    Sepsis associated hypotension (HCC) 08/03/2019   Smoker    Family History  Problem Relation Age of Onset   COPD Mother    Prostate cancer Father    Colon cancer Neg Hx    Rectal cancer Neg Hx    Stomach cancer Neg Hx    Esophageal cancer Neg Hx    Allergies  Allergen Reactions   Lisinopril     Other reaction(s): renal effects   Codeine Other (See Comments) and Hives    unknown Other reaction(s): Other (See Comments) unknown unknown   Current Outpatient Medications  Medication Sig Dispense Refill Last Dose/Taking   Accu-Chek Softclix Lancets lancets Use to test blood sugar once daily 100 each 11    acetaminophen  (ACETAMINOPHEN  8 HOUR) 650 MG CR tablet Take 1 tablet (650 mg total) by mouth every 8 (eight) hours as needed for pain (Patient taking differently: Take 1,300 mg by mouth every 8 (eight) hours as needed for pain or fever.) 90 tablet 1    amLODipine  (NORVASC ) 10 MG tablet Take 1 tablet (10 mg total) by mouth daily. 90 tablet 0    ascorbic acid  (VITAMIN C ) 500 MG tablet Take 1 tablet (500 mg total) by mouth daily. 30 tablet 1    atorvastatin  (LIPITOR) 10 MG tablet Take 1 tablet (10 mg total) by mouth daily. 90 tablet 1    Blood Glucose Monitoring Suppl (ACCU-CHEK GUIDE) w/Device KIT Use to test blood sugar once daily 1 kit 0    cetirizine  (ZYRTEC ) 10 MG tablet Take 1 tablet (10 mg total)  by mouth daily. 90 tablet 1    dapagliflozin  propanediol (FARXIGA ) 10 MG TABS tablet Take 1 tablet (10 mg total) by mouth daily before breakfast. 90 tablet 2    folic acid  (FOLVITE ) 1 MG tablet Take 1 tablet (1 mg total) by mouth daily. 90 tablet 0    gabapentin  (NEURONTIN ) 300 MG capsule Take 1 capsule (300 mg total) by mouth 2 (two) times daily. (Patient taking differently: Take 600 mg by mouth at bedtime.) 90 capsule 3    glucose blood (ACCU-CHEK GUIDE TEST) test strip Use to test blood sugar once daily 100 each 11    guaiFENesin  (MUCINEX ) 600 MG 12 hr tablet Take 1 tablet (600 mg total) by mouth 2 (two) times daily as needed to loosen phlegm or cough. 180 tablet 1    hydrOXYzine  (VISTARIL ) 50 MG capsule Take 1 capsule (50 mg total) by mouth at bedtime as needed. 30 capsule 2    ipratropium-albuterol  (DUONEB) 0.5-2.5 (3) MG/3ML SOLN Take 3 mLs by nebulization every 6 (six) hours as needed.      isosorbide  mononitrate (IMDUR ) 30 MG 24 hr tablet Take 1 tablet (30 mg total) by mouth daily. 30 tablet 0    Lancets Misc. (ACCU-CHEK SOFTCLIX LANCET DEV) KIT Use to test blood sugar once daily 1 kit 0  losartan  (COZAAR ) 25 MG tablet Take 1 tablet (25 mg total) by mouth daily. 30 tablet 5    metFORMIN  (GLUCOPHAGE ) 500 MG tablet Take 2 tablets (1,000 mg total) by mouth daily with breakfast AND 1 tablet (500 mg total) daily with supper. 90 tablet 3    metoprolol  succinate (TOPROL -XL) 25 MG 24 hr tablet Take 1 tablet (25 mg total) by mouth daily. 90 tablet 3    nicotine  (NICODERM CQ  - DOSED IN MG/24 HOURS) 21 mg/24hr patch Place 1 patch (21 mg total) onto the skin daily. 28 patch 0    thiamine  (VITAMIN B1) 100 MG tablet Take 1 tablet (100 mg total) by mouth daily. 90 tablet 0    Tiotropium Bromide -Olodaterol 2.5-2.5 MCG/ACT AERS Inhale 2 puffs into the lungs daily. 4 g 3    torsemide  (DEMADEX ) 20 MG tablet Take 2 tablets (40 mg total) by mouth daily.      triamcinolone  cream (KENALOG ) 0.5 % Apply topically  to legs twice daily for up to 2 weeks as needed for eczema 30 g 1    No current facility-administered medications for this encounter.   ROS  Review of Systems  HENT:  Positive for sinus pressure.   Respiratory:  Positive for shortness of breath (with activity).   Gastrointestinal:  Positive for abdominal distention and abdominal pain.  Musculoskeletal:  Positive for gait problem.  Skin:  Positive for color change, rash and wound.  Psychiatric/Behavioral:  Positive for dysphoric mood.    Vital signs:  BP 129/68 (BP Location: Right Arm)   Pulse 65   Temp 98.2 F (36.8 C) (Oral)   Resp 20   SpO2 92%  Exam:  Physical Exam Vitals reviewed.  Constitutional:      Appearance: He is obese.  Cardiovascular:     Rate and Rhythm: Normal rate.     Heart sounds: Normal heart sounds.     Comments: pacemaker Pulmonary:     Breath sounds: Wheezing present.  Abdominal:     Hernia: A hernia is present.  Neurological:     Mental Status: He is alert.     Stoma type/location:  RLQ ileostomy  Stomal assessment/size:  1" recessed Peristomal assessment:  chronic irritant dermatitis  Treatment options for stomal/peristomal skin: stoma powder skin prep barrier ring  flat 1 piece pouch  Output: soft yellow stool Ostomy pouching: 1pc. Flat   Education provided:  wound care performed.  Reminded to cleanse abdominal periwound skin (and wound) with VASHE to balance skin pH and integrity.  Aquacel to open areas. Cover with dry gauze and ABD pads/tape.      Impression/dx  Ileostomy Parastomal hernia Nonhealing wound  Discussion  See back as needed.  Plan  COntinues to try and reduce smoking and drinking.  Understands its negative impact on health.     Visit time: 45 minutes.   Branda Cain FNP-BC

## 2023-10-18 ENCOUNTER — Encounter: Payer: Self-pay | Admitting: *Deleted

## 2023-10-18 NOTE — Telephone Encounter (Signed)
 Copied from CRM [id number]. Topic: Clinical - Medication Question >> Oct 17, 2023  1:46 PM Kita Perish H wrote: Reason for CRM: Patient wants to clarify what medications he should be taking, confused on if provider told him to discontinue dapagliflozin  propanediol (FARXIGA ) 10 MG TABS tablet and metFORMIN  (GLUCOPHAGE ) 500 MG and take the torsemide  (DEMADEX ) 20 MG tablet. Please reach out to patient.  Harles [phone] >> Oct 17, 2023  3:24 PM Nurse Nilda Barthel wrote: Patient calling again today, first call 10/16/23 that was already routed to PCP nurse.

## 2023-10-18 NOTE — Patient Outreach (Signed)
 Request for independent assessment for personal care services faxed over to patient's provider for completion.   Osvaldo Lamping, LCSW [place]  [facility], [facility] Licensed Clinical Social Worker Care Coordinator  Direct Dial: [phone]

## 2023-10-19 ENCOUNTER — Other Ambulatory Visit: Payer: Self-pay

## 2023-10-19 NOTE — Patient Instructions (Signed)
 Visit Information  [name] was given information about Medicaid Managed Care team care coordination services as a part of their [facility] Medicaid benefit. Luis [name] verbally consented to engagement with the [facility] Managed Care team.   If you are experiencing a medical emergency, please call 911 or report to your local emergency department or urgent care.   If you have a non-emergency medical problem during routine business hours, please contact your provider's office and ask to speak with a nurse.   For questions related to your [facility] health plan, please call: [phone] or go here:[email or web address]  If you would like to schedule transportation through your [facility] plan, please call the following number at least 2 days in advance of your appointment: [phone].   You can also use the MTM portal or MTM mobile app to manage your rides. Reimbursement for transportation is available through [facility]! For the portal, please go to [email or web address]  Call the [facility] Crisis Line at [phone], at any time, 24 hours a day, 7 days a week. If you are in danger or need immediate medical attention call 911.  If you would like help to quit smoking, call 1-800-QUIT-NOW ((702) 170-5437) OR Espaol: 1-855-Djelo-Ya (7-253-664-4034) o para ms informacin haga clic aqu or Text READY to 742-595 to register via text  Luis [name] - following are the goals we discussed in your visit today:   Goals Addressed             This Visit's Progress    VBCI RN Care Plan-COPD       Problems:  Chronic Disease Management support and education needs related to COPD Lacks caregiver support. Non-adherence to prescribed medication regimen  Goal: Over the next 90 days the Patient will attend all scheduled medical appointments: including primary care provider 10/05/23; advance heart failure clinic 10/06/23;  as evidenced by patient report and review of chart         continue to work with RN Care Manager and/or Social Worker to address care management and care coordination needs related to COPD as evidenced by adherence to care management team scheduled appointments     demonstrate Improved adherence to prescribed treatment plan for COPD as evidenced by patient report and review of chart  Interventions:   COPD Interventions: Discussed the importance of adequate rest and management of fatigue with COPD RNCM assessed patient's health status at this time Reviewed upcoming appointment with pulmonology-11/01/23 with Cullen Dose, NP  Patient Self-Care Activities:  Attend all scheduled provider appointments Call provider office for new concerns or questions  Take medications as prescribed   Work with the social worker to address care coordination needs and will continue to work with the clinical team to address health care and disease management related needs   Plan:  Telephone follow up appointment with care management team member scheduled for:  10/20/23     VBCI RN Care Plan-HF       Problems:  Chronic Disease Management support and education needs related to CHF  Goal: Over the next 90 days the Patient will attend all scheduled medical appointments: including advance HF clinic scheduled for 10/05/23 as evidenced by patient report and review of chart        continue to work with RN Care Manager and/or Social Worker to address care management and care coordination needs related to CHF as evidenced by adherence to care management team scheduled appointments     demonstrate Improved adherence to prescribed treatment plan for CHF as evidenced  by  patient report or review of chart  Interventions:   Heart Failure Interventions: Discussed the importance of keeping all appointments with provider Advised patient to continue to monitor weights and notify provider if worsening of condition weight today 233 lb unchanged from last telephone  assessment. Reviewed upcoming appointment with cardiology on 10/23/23 and cardiac rehab 10/25/23. Confirmed patient to arrange transportation.  Patient Self-Care Activities:  Attend all scheduled provider appointments Call provider office for new concerns or questions  Take medications as prescribed   Work with the social worker to address care coordination needs and will continue to work with the clinical team to address health care and disease management related needs call office if I gain more than 2 pounds in one day or 5 pounds in one week watch for swelling in feet, ankles and legs every day weigh myself daily track symptoms and what helps feel better or worse  Plan:  Telephone follow up appointment with care management team member scheduled for:  10/20/23        Patient verbalizes understanding of instructions and care plan provided today and agrees to view in MyChart. Active MyChart status and patient understanding of how to access instructions and care plan via MyChart confirmed with patient.     RN Care Manager will followed up with patient on 10/20/23  [name], RN, MSN, BSN, CCM [place]  [facility], Population Health Case Manager Phone: [phone]

## 2023-10-19 NOTE — Patient Outreach (Signed)
 Complex Care Management   Visit Note  10/19/2023  Name:  Luis [name] MRN: [id number] DOB: [birth year]  Situation: Referral received for Complex Care Management related to heart failure I obtained verbal consent from Patient.  Visit completed with patient on the phone  Background:   Past Medical History:  Diagnosis Date   Acute respiratory failure with hypoxia (HCC) 12/23/2022   Alcohol withdrawal syndrome, with delirium (HCC) 12/27/2022   COPD (chronic obstructive pulmonary disease) (HCC)    Diabetes mellitus without complication (HCC)    Diverticulitis    DVT (deep venous thrombosis) (HCC)    x3   Encephalopathy acute 12/28/2022   ETOH abuse    H/O colectomy    Hyperlipidemia    Hypertension    Sepsis associated hypotension (HCC) 08/03/2019   Smoker     Assessment: Patient states, "I feel good, I feel great right now".  Patient Reported Symptoms:  Cognitive Cognitive Status: Alert and oriented to person, place, and time, Normal speech and language skills, Insightful and able to interpret abstract concepts      Neurological Neurological Review of Symptoms: No symptoms reported    HEENT HEENT Symptoms Reported: No symptoms reported      Cardiovascular Cardiovascular Symptoms Reported: No symptoms reported Does patient have uncontrolled Hypertension?: No Weight: 233 lb (105.7 kg) (patient weight today home scale) Cardiovascular Comment: patient scheduled appointment with cardiology on 10/23/23 and to begin cardiac rehab on 10/25/23. discussed transportation and patient to schedule his transportation for upcoming appointments next week.  Respiratory Respiratory Symptoms Reported: No symptoms reported Other Respiratory Symptoms: patient denies cough at this time. Patient with upcoming appointment with pulmonology Cullen Dose on 11/01/23. patient to schedule appointment with transportation for next weeks appointments.    Endocrine Patient reports the following symptoms  related to hypoglycemia or hyperglycemia : No symptoms reported    Gastrointestinal Gastrointestinal Symptoms Reported: Other Other Gastrointestinal Symptoms: ventral hernia with RLQ ileostomy. office visit with general surgery 09/27/23. followed by Branda Cain, NP ostomy clinic last seen 10/17/23.      Genitourinary Genitourinary Symptoms Reported: No symptoms reported Additional Genitourinary Details: RLQ ileostomy-is followed by Branda Cain, NP at ostomy clinic. patient with upcoming appointment at ostomy clinic on 10/24/23. patient to schedule transportaion for upcoming appointments.    Integumentary Integumentary Symptoms Reported: Not assessed    Musculoskeletal Musculoskelatal Symptoms Reviewed: Difficulty walking, Weakness Additional Musculoskeletal Details: ventral hernia with ileostomy, weak abdominal muscles- chronic condition        Psychosocial Psychosocial Symptoms Reported: Other Other Psychosocial Conditions: patient confirms he has spoken with LCSW who is working with patient on personal care services to assist with ADL's. Patient with upcoming telephone appointment scheduled with LCSW for 11/01/23, who verbalized he is looking forward to LCSW assistance.            10/12/2023    1:56 PM  Depression screen PHQ 2/9  Difficult doing work/chores Very difficult    There were no vitals filed for this visit.  Medications Reviewed Today   Medications were not reviewed in this encounter   Patient declines to review medications at this time. Patient reports he has communicated with PCP office today and will get medication clarification from provider at that time. Patient request RNCM return call tomorrow.  Recommendation:   PCP Follow-up with primary provider tomorrow(10/20/23 at 1:00 pm) as scheduled  Follow Up Plan:   RN Care Manager will follow up with patient tomorrow to complete today's assessment.  [name], RN, MSN,  BSN, CCM   [facility], Population Health Case Manager Phone: [phone]

## 2023-10-19 NOTE — Telephone Encounter (Signed)
 Left pt voice mail to call back for the clarification of his medication

## 2023-10-19 NOTE — Telephone Encounter (Signed)
 Made pt aware that per lab not provider want him to start back on the torsemide . Pt ask about his metformin  and farxiga , let pt know to hold till visit tomorrow as provider has not clarified if he needs to go back on it. Pt stated he has already started back on it today

## 2023-10-20 ENCOUNTER — Encounter: Payer: Self-pay | Admitting: Nurse Practitioner

## 2023-10-20 ENCOUNTER — Other Ambulatory Visit: Payer: Self-pay

## 2023-10-20 ENCOUNTER — Ambulatory Visit (INDEPENDENT_AMBULATORY_CARE_PROVIDER_SITE_OTHER): Admitting: Nurse Practitioner

## 2023-10-20 ENCOUNTER — Other Ambulatory Visit (HOSPITAL_COMMUNITY): Payer: Self-pay

## 2023-10-20 ENCOUNTER — Telehealth

## 2023-10-20 ENCOUNTER — Telehealth (HOSPITAL_COMMUNITY): Payer: Self-pay

## 2023-10-20 VITALS — BP 138/82 | HR 64 | Temp 98.4°F | Ht 73.0 in | Wt 233.0 lb

## 2023-10-20 DIAGNOSIS — I5042 Chronic combined systolic (congestive) and diastolic (congestive) heart failure: Secondary | ICD-10-CM

## 2023-10-20 DIAGNOSIS — Z7984 Long term (current) use of oral hypoglycemic drugs: Secondary | ICD-10-CM | POA: Diagnosis not present

## 2023-10-20 DIAGNOSIS — Z9181 History of falling: Secondary | ICD-10-CM

## 2023-10-20 DIAGNOSIS — E1142 Type 2 diabetes mellitus with diabetic polyneuropathy: Secondary | ICD-10-CM | POA: Diagnosis not present

## 2023-10-20 LAB — BASIC METABOLIC PANEL WITH GFR
BUN: 56 mg/dL — ABNORMAL HIGH (ref 6–23)
CO2: 28 meq/L (ref 19–32)
Calcium: 10.4 mg/dL (ref 8.4–10.5)
Chloride: 93 meq/L — ABNORMAL LOW (ref 96–112)
Creatinine, Ser: 1.54 mg/dL — ABNORMAL HIGH (ref 0.40–1.50)
GFR: 47.48 mL/min — ABNORMAL LOW (ref >60.00)
Glucose, Bld: 205 mg/dL — ABNORMAL HIGH (ref 70–99)
Potassium: 4 meq/L (ref 3.5–5.1)
Sodium: 135 meq/L (ref 135–145)

## 2023-10-20 MED ORDER — ACCU-CHEK SOFTCLIX LANCETS MISC
1.0000 | Freq: Every day | 11 refills | Status: AC
  Filled 2023-10-20: qty 100, 90d supply, fill #0
  Filled 2024-07-13: qty 100, 90d supply, fill #1

## 2023-10-20 MED ORDER — ACCU-CHEK GUIDE W/DEVICE KIT
1.0000 | PACK | Freq: Every day | 0 refills | Status: DC
  Filled 2023-10-20: qty 1, 30d supply, fill #0

## 2023-10-20 MED ORDER — ACCU-CHEK SOFTCLIX LANCET DEV KIT
1.0000 | PACK | Freq: Every day | 0 refills | Status: AC
Start: 2023-10-20 — End: 2023-11-22
  Filled 2023-10-20: qty 1, 30d supply, fill #0

## 2023-10-20 MED ORDER — ACCU-CHEK GUIDE TEST VI STRP
1.0000 | ORAL_STRIP | Freq: Every day | 11 refills | Status: AC
  Filled 2023-10-20: qty 100, 100d supply, fill #0

## 2023-10-20 NOTE — Assessment & Plan Note (Signed)
 Prescription for cane provided to patient today.

## 2023-10-20 NOTE — Progress Notes (Signed)
 Established Patient Office Visit  Subjective   Patient ID: Luis [name], male    DOB: 01-12-1961  Age: 63 y.o. MRN: [id number]  Chief Complaint  Patient presents with   Medical Management of Chronic Issues    Patient has a close follow-up. He had reduced torsemide  to 40 mg daily, and temporarily stopped his metformin  and Farxiga  due to significant reduction in GFR after increasing torsemide  to 80 mg daily.  We also discontinued spironolactone  due to hyperkalemia recently. He is here for follow-up today reports that he feels at baseline, generally energy levels are good as well.  Last BMP did identify the GFR rebounded back up to 50.2 with improvement of creatinine at 1.47 and normalization of potassium at 4.9.  He is taking his torsemide  40 mg daily.  Weight has been stable, denies any new swelling, weight gain, difficulty with breathing. He is wondering if he can restart his metformin  and Farxiga . He reports that his glucometer and strips are old and is wondering if he can get a new prescription.  He also would like a new prescription for a cane.    ROS: see HPI    Objective:     BP 138/82 (BP Location: Left Arm, Patient Position: Sitting, Cuff Size: Normal)   Pulse 64   Temp 98.4 F (36.9 C) (Oral)   Ht 6\' 1"  (1.854 m)   Wt 233 lb (105.7 kg)   SpO2 94%   BMI 30.74 kg/m  BP Readings from Last 3 Encounters:  10/20/23 138/82  10/17/23 129/68  10/11/23 116/78   Wt Readings from Last 3 Encounters:  10/20/23 233 lb (105.7 kg)  10/19/23 233 lb (105.7 kg)  10/12/23 234 lb (106.1 kg)      Physical Exam Vitals reviewed.  Constitutional:      Appearance: Normal appearance.  HENT:     Head: Normocephalic and atraumatic.  Cardiovascular:     Rate and Rhythm: Normal rate and regular rhythm.  Pulmonary:     Effort: Pulmonary effort is normal.     Breath sounds: Normal breath sounds.  Musculoskeletal:     Cervical back: Neck supple.  Skin:    General: Skin is warm and  dry.  Neurological:     Mental Status: He is alert and oriented to person, place, and time.  Psychiatric:        Mood and Affect: Mood normal.        Behavior: Behavior normal.        Thought Content: Thought content normal.        Judgment: Judgment normal.      No results found for any visits on 10/20/23.    The ASCVD Risk score (Arnett DK, et al., 2019) failed to calculate for the following reasons:   The valid total cholesterol range is 130 to 320 mg/dL    Assessment & Plan:   Problem List Items Addressed This Visit       Cardiovascular and Mediastinum   Chronic combined systolic and diastolic heart failure (HCC)   Chronic, euvolemic, stable Kidney function improved with reduction in torsemide  dose. Patient to continue torsemide  40 mg daily, restart Farxiga  10 mg daily, continue amlodipine  10 mg daily, continue Imdur  30 mg daily, losartan  25 mg daily, metoprolol  25 mg daily. Continue to follow with heart failure clinic closely, and follow-up with me as scheduled.        Endocrine   Type 2 diabetes mellitus with diabetic polyneuropathy, without long-term current use of insulin  (  HCC) - Primary   Chronic, stable Prescription for new glucometer as the patient can check fasting blood sugars on a regular basis sent to pharmacy today. He will restart metformin  1000 mg daily with breakfast and 500 mg daily with supper.  Continue statin, continue Farxiga , continue gabapentin       Relevant Medications   Blood Glucose Monitoring Suppl DEVI   Glucose Blood (BLOOD GLUCOSE TEST STRIPS) STRP   Lancet Device MISC   Lancets Misc. MISC   Other Relevant Orders   Basic metabolic panel with GFR     Other   At risk for falling   Prescription for cane provided to patient today.      Relevant Orders   For home use only DME Other see comment    Return for as scheduled.    Zorita Hiss, NP

## 2023-10-20 NOTE — Telephone Encounter (Signed)
 Called to confirm/remind patient of their appointment at the [facility] on 10/23/23.   Appointment:   [] Confirmed  [x] Left mess   [] No answer/No voice mail  [] VM Full/unable to leave message  [] Phone not in service  And to bring in all medications and/or complete list.

## 2023-10-20 NOTE — Assessment & Plan Note (Signed)
 Chronic, stable Prescription for new glucometer as the patient can check fasting blood sugars on a regular basis sent to pharmacy today. He will restart metformin  1000 mg daily with breakfast and 500 mg daily with supper.  Continue statin, continue Farxiga , continue gabapentin 

## 2023-10-20 NOTE — Assessment & Plan Note (Signed)
 Chronic, euvolemic, stable Kidney function improved with reduction in torsemide  dose. Patient to continue torsemide  40 mg daily, restart Farxiga  10 mg daily, continue amlodipine  10 mg daily, continue Imdur  30 mg daily, losartan  25 mg daily, metoprolol  25 mg daily. Continue to follow with heart failure clinic closely, and follow-up with me as scheduled.

## 2023-10-21 ENCOUNTER — Other Ambulatory Visit: Payer: Self-pay | Admitting: Primary Care

## 2023-10-21 ENCOUNTER — Other Ambulatory Visit (HOSPITAL_COMMUNITY): Payer: Self-pay

## 2023-10-21 DIAGNOSIS — K469 Unspecified abdominal hernia without obstruction or gangrene: Secondary | ICD-10-CM | POA: Diagnosis not present

## 2023-10-21 DIAGNOSIS — T8189XA Other complications of procedures, not elsewhere classified, initial encounter: Secondary | ICD-10-CM | POA: Diagnosis not present

## 2023-10-22 ENCOUNTER — Other Ambulatory Visit (HOSPITAL_COMMUNITY): Payer: Self-pay

## 2023-10-23 ENCOUNTER — Ambulatory Visit (HOSPITAL_COMMUNITY)
Admission: RE | Admit: 2023-10-23 | Discharge: 2023-10-23 | Disposition: A | Discharge status: Home / Self Care | Source: Ambulatory Visit | Attending: Family Medicine | Admitting: Family Medicine

## 2023-10-23 ENCOUNTER — Other Ambulatory Visit (HOSPITAL_COMMUNITY): Payer: Self-pay

## 2023-10-23 ENCOUNTER — Encounter (HOSPITAL_COMMUNITY): Payer: Self-pay

## 2023-10-23 VITALS — BP 132/80 | HR 80 | Ht 73.0 in | Wt 237.2 lb

## 2023-10-23 DIAGNOSIS — E1142 Type 2 diabetes mellitus with diabetic polyneuropathy: Secondary | ICD-10-CM | POA: Diagnosis not present

## 2023-10-23 DIAGNOSIS — Z720 Tobacco use: Secondary | ICD-10-CM

## 2023-10-23 DIAGNOSIS — I10 Essential (primary) hypertension: Secondary | ICD-10-CM | POA: Diagnosis not present

## 2023-10-23 DIAGNOSIS — Z7984 Long term (current) use of oral hypoglycemic drugs: Secondary | ICD-10-CM | POA: Insufficient documentation

## 2023-10-23 DIAGNOSIS — I110 Hypertensive heart disease with heart failure: Secondary | ICD-10-CM | POA: Insufficient documentation

## 2023-10-23 DIAGNOSIS — J449 Chronic obstructive pulmonary disease, unspecified: Secondary | ICD-10-CM | POA: Diagnosis not present

## 2023-10-23 DIAGNOSIS — E119 Type 2 diabetes mellitus without complications: Secondary | ICD-10-CM | POA: Insufficient documentation

## 2023-10-23 DIAGNOSIS — E785 Hyperlipidemia, unspecified: Secondary | ICD-10-CM | POA: Insufficient documentation

## 2023-10-23 DIAGNOSIS — Z950 Presence of cardiac pacemaker: Secondary | ICD-10-CM | POA: Insufficient documentation

## 2023-10-23 DIAGNOSIS — Z419 Encounter for procedure for purposes other than remedying health state, unspecified: Secondary | ICD-10-CM | POA: Diagnosis not present

## 2023-10-23 DIAGNOSIS — F1010 Alcohol abuse, uncomplicated: Secondary | ICD-10-CM | POA: Diagnosis not present

## 2023-10-23 DIAGNOSIS — F17210 Nicotine dependence, cigarettes, uncomplicated: Secondary | ICD-10-CM | POA: Insufficient documentation

## 2023-10-23 DIAGNOSIS — Z432 Encounter for attention to ileostomy: Secondary | ICD-10-CM | POA: Diagnosis not present

## 2023-10-23 DIAGNOSIS — I442 Atrioventricular block, complete: Secondary | ICD-10-CM | POA: Diagnosis not present

## 2023-10-23 DIAGNOSIS — Z7689 Persons encountering health services in other specified circumstances: Secondary | ICD-10-CM | POA: Diagnosis not present

## 2023-10-23 DIAGNOSIS — Z86718 Personal history of other venous thrombosis and embolism: Secondary | ICD-10-CM | POA: Diagnosis not present

## 2023-10-23 DIAGNOSIS — I5032 Chronic diastolic (congestive) heart failure: Secondary | ICD-10-CM | POA: Insufficient documentation

## 2023-10-23 NOTE — Progress Notes (Signed)
 ReDS Vest / Clip - 10/23/23 1216       ReDS Vest / Clip   Station Marker D    Ruler Value 34    ReDS Value Range Low volume    ReDS Actual Value 36

## 2023-10-23 NOTE — Patient Instructions (Addendum)
 Thank you for coming in today  If you had labs drawn today, any labs that are abnormal the clinic will call you No news is good news  Medications: You are able to take 20 mg of torsemide  as needed For weight gain of 3 lbs in 24 hours or 5 lbs in a week   Follow up appointments:  Your physician recommends that you schedule a follow-up appointment in:  3 months With Dr. Bruce Caper with echocardiogram  Your physician has requested that you have an echocardiogram. Echocardiography is a painless test that uses sound waves to create images of your heart. It provides your doctor with information about the size and shape of your heart and how well your heart's chambers and valves are working. This procedure takes approximately one hour. There are no restrictions for this procedure.       Do the following things EVERYDAY: Weigh yourself in the morning before breakfast. Write it down and keep it in a log. Take your medicines as prescribed Eat low salt foods--Limit salt (sodium) to 2000 mg per day.  Stay as active as you can everyday Limit all fluids for the day to less than 2 liters   At the [facility], you and your health needs are our priority. As part of our continuing mission to provide you with exceptional heart care, we have created designated Provider Care Teams. These Care Teams include your primary Cardiologist (physician) and Advanced Practice Providers (APPs- Physician Assistants and Nurse Practitioners) who all work together to provide you with the care you need, when you need it.   You may see any of the following providers on your designated Care Team at your next follow up: Dr Jules Oar Dr Peder Bourdon Dr. Mimi Alt, NP Ruddy Corral, [place] [facility] [place], [place] Dennise Fitz, NP Luster Salters, PharmD   Please be sure to bring in all your medications bottles to every appointment.    Thank you for choosing [facility]  If you have any questions or concerns before your next appointment please send us  a message through [place] or call our office at [phone].    TO LEAVE A MESSAGE FOR THE NURSE SELECT OPTION 2, PLEASE LEAVE A MESSAGE INCLUDING: YOUR NAME DATE OF BIRTH CALL BACK NUMBER REASON FOR CALL**this is important as we prioritize the call backs  YOU WILL RECEIVE A CALL BACK THE SAME DAY AS LONG AS YOU CALL BEFORE 4:00 PM

## 2023-10-23 NOTE — Progress Notes (Signed)
 [facility] Note   PCP: Zorita Hiss, NP HF Cardiologist: Dr. Bruce Caper   HPI: Luis [name] is a 63 y.o.male with heart failure with preserved ejection fraction, complete heart block status post leadless permanent pacemaker, hypertension, hyperlipidemia, history of DVT, type 2 diabetes, tobacco use, history of alcohol use, diverticulitis status post colectomy with ileostomy complicated by wound infections.   He has had multiple hospitalizations in the past 1 year.  Admitted in 7/24 with hypoxic/hypercarbic respiratory failure secondary to pneumonia/COPD and diastolic heart failure exacerbation requiring intubation and mechanical ventilation.  He was also delirious at that time due to alcohol withdrawal.  Echo with EF 50 to 55% and grade 2 diastolic dysfunction.    Admitted 9/24 with respiratory failure 2/2 to COPD and HFpEF requiring IV steroids, antibiotics and diuresis. Since that time, he has been seen in [facility] clinic and referred to [facility], followed by Dr. Bruce Caper.   Admitted 2/25 for acute respiratory failure with hypoxia due to PNA, COPD exacerbation and a/c diastolic CHF. He was managed by internal medicine. Echo not repeated. Per hospital notes, he was aggressively diuresed, over 20 L, back to baseline. Pt had admitted to dietary indiscretion w/ sodium and not adhearing to fluid restriction at home prior to being admitted. Also admitted to drinking large amounts of alcohol. GDMT titrated and he was discharged home, weight 225 lbs.  Today he returns for HF follow up. Overall feeling "great!" He is not SOB walking on flat ground. Denies palpitations, abnormal bleeding, CP, dizziness, edema, or PND/Orthopnea. Appetite ok. Weight at home 230-235 pounds. Taking all medications. Drinks 3-4 vodka drinks/day, smoking 1/2 ppd, rare THC. Has dry mouth, drinks a lot of fluid.   Cardiac Studies - Echo 7/24: EF 50-55%  - cMRI 2022: LVEF 70%, RV nl. +LGE noted in the mid-wall  basal anteroseptum and mid-wall basal inferolateral wall, noncoronary disease pattern   Past Medical History:  Diagnosis Date   Acute respiratory failure with hypoxia (HCC) 12/23/2022   Alcohol withdrawal syndrome, with delirium (HCC) 12/27/2022   COPD (chronic obstructive pulmonary disease) (HCC)    Diabetes mellitus without complication (HCC)    Diverticulitis    DVT (deep venous thrombosis) (HCC)    x3   Encephalopathy acute 12/28/2022   ETOH abuse    H/O colectomy    Hyperlipidemia    Hypertension    Sepsis associated hypotension (HCC) 08/03/2019   Smoker    Current Outpatient Medications  Medication Sig Dispense Refill   Accu-Chek Softclix Lancets lancets Use to test blood sugar once daily 100 each 11   acetaminophen  (ACETAMINOPHEN  8 HOUR) 650 MG CR tablet Take 1 tablet (650 mg total) by mouth every 8 (eight) hours as needed for pain (Patient taking differently: Take 1,300 mg by mouth every 8 (eight) hours as needed for pain or fever.) 90 tablet 1   amLODipine  (NORVASC ) 10 MG tablet Take 1 tablet (10 mg total) by mouth daily. 90 tablet 0   ascorbic acid  (VITAMIN C ) 500 MG tablet Take 1 tablet (500 mg total) by mouth daily. 30 tablet 1   atorvastatin  (LIPITOR) 10 MG tablet Take 1 tablet (10 mg total) by mouth daily. 90 tablet 1   Blood Glucose Monitoring Suppl (ACCU-CHEK GUIDE) w/Device KIT Use to test blood sugar once daily 1 kit 0   cetirizine  (ZYRTEC ) 10 MG tablet Take 1 tablet (10 mg total) by mouth daily. 90 tablet 1   dapagliflozin  propanediol (FARXIGA ) 10 MG TABS tablet Take 1  tablet (10 mg total) by mouth daily before breakfast. 90 tablet 2   folic acid  (FOLVITE ) 1 MG tablet Take 1 tablet (1 mg total) by mouth daily. 90 tablet 0   gabapentin  (NEURONTIN ) 300 MG capsule Take 1 capsule (300 mg total) by mouth 2 (two) times daily. (Patient taking differently: Take 600 mg by mouth at bedtime.) 90 capsule 3   glucose blood (ACCU-CHEK GUIDE TEST) test strip Use to test blood sugar  once daily 100 each 11   guaiFENesin  (MUCINEX ) 600 MG 12 hr tablet Take 1 tablet (600 mg total) by mouth 2 (two) times daily as needed to loosen phlegm or cough. 180 tablet 1   hydrOXYzine  (VISTARIL ) 50 MG capsule Take 1 capsule (50 mg total) by mouth at bedtime as needed. 30 capsule 2   ipratropium-albuterol  (DUONEB) 0.5-2.5 (3) MG/3ML SOLN Take 3 mLs by nebulization every 6 (six) hours as needed.     isosorbide  mononitrate (IMDUR ) 30 MG 24 hr tablet Take 1 tablet (30 mg total) by mouth daily. 30 tablet 0   Lancets Misc. (ACCU-CHEK SOFTCLIX LANCET DEV) KIT Use to test blood sugar once daily 1 kit 0   losartan  (COZAAR ) 25 MG tablet Take 1 tablet (25 mg total) by mouth daily. 30 tablet 5   metFORMIN  (GLUCOPHAGE ) 500 MG tablet Take 2 tablets (1,000 mg total) by mouth daily with breakfast AND 1 tablet (500 mg total) daily with supper. 90 tablet 3   metoprolol  succinate (TOPROL -XL) 25 MG 24 hr tablet Take 1 tablet (25 mg total) by mouth daily. 90 tablet 3   nicotine  (NICODERM CQ  - DOSED IN MG/24 HOURS) 21 mg/24hr patch Place 1 patch (21 mg total) onto the skin daily. 28 patch 0   thiamine  (VITAMIN B1) 100 MG tablet Take 1 tablet (100 mg total) by mouth daily. 90 tablet 0   Tiotropium Bromide -Olodaterol 2.5-2.5 MCG/ACT AERS Inhale 2 puffs into the lungs daily. 4 g 3   torsemide  (DEMADEX ) 20 MG tablet Take 2 tablets (40 mg total) by mouth daily.     triamcinolone  cream (KENALOG ) 0.5 % Apply topically to legs twice daily for up to 2 weeks as needed for eczema 30 g 1   No current facility-administered medications for this encounter.   Allergies  Allergen Reactions   Lisinopril     Other reaction(s): renal effects   Codeine Other (See Comments) and Hives    unknown Other reaction(s): Other (See Comments) unknown unknown   Social History   Socioeconomic History   Marital status: Single    Spouse name: Not on file   Number of children: Not on file   Years of education: Not on file   Highest  education level: Not on file  Occupational History   Occupation: Pt states not able to work  Tobacco Use   Smoking status: Every Day    Current packs/day: 1.00    Average packs/day: 1 pack/day for 44.0 years (44.0 ttl pk-yrs)    Types: Cigarettes   Smokeless tobacco: Current   Tobacco comments:    Less than .5pk daily 10/03/23  Vaping Use   Vaping status: Never Used  Substance and Sexual Activity   Alcohol use: Yes    Comment: 1 drink (1 pt) 5 days per week   Drug use: Yes    Types: Marijuana   Sexual activity: Not Currently  Other Topics Concern   Not on file  Social History Narrative   Not on file   Social Drivers of Health  Financial Resource Strain: Not on file  Food Insecurity: No Food Insecurity (10/12/2023)   Hunger Vital Sign    Worried About Running Out of Food in the Last Year: Never true    Ran Out of Food in the Last Year: Never true  Transportation Needs: Unmet Transportation Needs (10/12/2023)   PRAPARE - Transportation    Lack of Transportation (Medical): Yes    Lack of Transportation (Non-Medical): Yes  Physical Activity: Not on file  Stress: Stress Concern Present (04/18/2023)   [employer] of Occupational Health - Occupational Stress Questionnaire    Feeling of Stress : To some extent  Social Connections: Not on file  Intimate Partner Violence: Not At Risk (10/12/2023)   Humiliation, Afraid, Rape, and Kick questionnaire    Fear of Current or Ex-Partner: No    Emotionally Abused: No    Physically Abused: No    Sexually Abused: No   Family History  Problem Relation Age of Onset   COPD Mother    Prostate cancer Father    Colon cancer Neg Hx    Rectal cancer Neg Hx    Stomach cancer Neg Hx    Esophageal cancer Neg Hx    Wt Readings from Last 3 Encounters:  10/23/23 107.6 kg (237 lb 3.2 oz)  10/20/23 105.7 kg (233 lb)  10/19/23 105.7 kg (233 lb)   BP 132/80   Pulse 80   Ht 6\' 1"  (1.854 m)   Wt 107.6 kg (237 lb 3.2 oz)   SpO2 94%   BMI  31.29 kg/m   PHYSICAL EXAM: General:  NAD. No resp difficulty, walked into clinic HEENT: Normal Neck: Supple. No JVD. Cor: Regular rate & rhythm. No rubs, gallops or murmurs. Lungs: Clear Abdomen: + distended, + colostomy and dressing Extremities: No cyanosis, clubbing, rash, edema Neuro: Alert & oriented x 3, moves all 4 extremities w/o difficulty. Affect pleasant.  ReDs reading: 36%, abnormal  ASSESSMENT & PLAN: Chronic Diastolic Heart Failure - cMRI in 2022 EF 70%, RV nl. +LGE noted in the mid-wall basal anteroseptum and mid-wall basal inferolateral wall, noncoronary disease pattern  - Echo 7/24 EF 50-55% RV nl  - NYHA II, volume ok, REDs 36% - Continue torsemide  40 mg daily. OK to take extra 20 mg PRN - Continue Farxiga  10 mg daily. - Continue Toprol  XL 25 mg daily.  - Continue losartan  25 mg daily.  - Off spiro with hyperkalemia. 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 No mediastinal lymphadenopathy on CTs to suggest pulmonary sarcoid. However, due to his continued excessive alcohol intake and multiple wounds (large abdominal wound) he would be high risk for immunosuppressive therapy with concerns regarding compliance.  - Repeat echo next visit. - Labs from 10/20/23 reviewed K 4.0, SCr 1.54  2. CHB s/p Leadless PPM - Following with EP  - No change.   3. T2DM - Most recent A1C 7.3  - Followed by PCP   4. HTN - BP up a bit, rushed to get to appt today - Consider increasing losartan  next visit with renal function stable   5. Abdominal wound / hernia w/ hx of ileostomy - Followed at [facility]  6. ETOH/tobacco abuse - Drinks 3-4 vodka drinks/day - Smoking 1/2 ppd - Discussed cessation   Follow up in 3 months with Dr. Bruce Caper + echo  Arlice Bene [place], [place] 10/23/23

## 2023-10-24 ENCOUNTER — Ambulatory Visit (HOSPITAL_COMMUNITY)
Admission: RE | Admit: 2023-10-24 | Discharge: 2023-10-24 | Disposition: A | Discharge status: Home / Self Care | Source: Ambulatory Visit | Attending: Nurse Practitioner | Admitting: Nurse Practitioner

## 2023-10-24 DIAGNOSIS — Z7689 Persons encountering health services in other specified circumstances: Secondary | ICD-10-CM | POA: Diagnosis not present

## 2023-10-24 DIAGNOSIS — T8189XD Other complications of procedures, not elsewhere classified, subsequent encounter: Secondary | ICD-10-CM | POA: Diagnosis not present

## 2023-10-24 DIAGNOSIS — T8189XA Other complications of procedures, not elsewhere classified, initial encounter: Secondary | ICD-10-CM | POA: Diagnosis not present

## 2023-10-24 DIAGNOSIS — L24B3 Irritant contact dermatitis related to fecal or urinary stoma or fistula: Secondary | ICD-10-CM

## 2023-10-24 DIAGNOSIS — Z932 Ileostomy status: Secondary | ICD-10-CM | POA: Insufficient documentation

## 2023-10-24 DIAGNOSIS — K439 Ventral hernia without obstruction or gangrene: Secondary | ICD-10-CM

## 2023-10-24 DIAGNOSIS — Z432 Encounter for attention to ileostomy: Secondary | ICD-10-CM | POA: Diagnosis not present

## 2023-10-24 NOTE — Progress Notes (Signed)
 [facility]   Reason for visit:  RLQ ileostomy  Nonhealing surgical wound Ventral hernia HPI:  Perforated diverticulum with end ileostomy Past Medical History:  Diagnosis Date   Acute respiratory failure with hypoxia (HCC) 12/23/2022   Alcohol withdrawal syndrome, with delirium (HCC) 12/27/2022   COPD (chronic obstructive pulmonary disease) (HCC)    Diabetes mellitus without complication (HCC)    Diverticulitis    DVT (deep venous thrombosis) (HCC)    x3   Encephalopathy acute 12/28/2022   ETOH abuse    H/O colectomy    Hyperlipidemia    Hypertension    Sepsis associated hypotension (HCC) 08/03/2019   Smoker    Family History  Problem Relation Age of Onset   COPD Mother    Prostate cancer Father    Colon cancer Neg Hx    Rectal cancer Neg Hx    Stomach cancer Neg Hx    Esophageal cancer Neg Hx    Allergies  Allergen Reactions   Lisinopril     Other reaction(s): renal effects   Codeine Other (See Comments) and Hives    unknown Other reaction(s): Other (See Comments) unknown unknown   Current Outpatient Medications  Medication Sig Dispense Refill Last Dose/Taking   Accu-Chek Softclix Lancets lancets Use to test blood sugar once daily 100 each 11    acetaminophen  (ACETAMINOPHEN  8 HOUR) 650 MG CR tablet Take 1 tablet (650 mg total) by mouth every 8 (eight) hours as needed for pain (Patient taking differently: Take 1,300 mg by mouth every 8 (eight) hours as needed for pain or fever.) 90 tablet 1    amLODipine  (NORVASC ) 10 MG tablet Take 1 tablet (10 mg total) by mouth daily. 90 tablet 0    ascorbic acid  (VITAMIN C ) 500 MG tablet Take 1 tablet (500 mg total) by mouth daily. 30 tablet 1    atorvastatin  (LIPITOR) 10 MG tablet Take 1 tablet (10 mg total) by mouth daily. 90 tablet 1    Blood Glucose Monitoring Suppl (ACCU-CHEK GUIDE) w/Device KIT Use to test blood sugar once daily 1 kit 0    cetirizine  (ZYRTEC ) 10 MG tablet Take 1 tablet (10 mg total) by mouth  daily. 90 tablet 1    dapagliflozin  propanediol (FARXIGA ) 10 MG TABS tablet Take 1 tablet (10 mg total) by mouth daily before breakfast. 90 tablet 2    folic acid  (FOLVITE ) 1 MG tablet Take 1 tablet (1 mg total) by mouth daily. 90 tablet 0    gabapentin  (NEURONTIN ) 300 MG capsule Take 1 capsule (300 mg total) by mouth 2 (two) times daily. (Patient taking differently: Take 600 mg by mouth at bedtime.) 90 capsule 3    glucose blood (ACCU-CHEK GUIDE TEST) test strip Use to test blood sugar once daily 100 each 11    guaiFENesin  (MUCINEX ) 600 MG 12 hr tablet Take 1 tablet (600 mg total) by mouth 2 (two) times daily as needed to loosen phlegm or cough. 180 tablet 1    hydrOXYzine  (VISTARIL ) 50 MG capsule Take 1 capsule (50 mg total) by mouth at bedtime as needed. 30 capsule 2    ipratropium-albuterol  (DUONEB) 0.5-2.5 (3) MG/3ML SOLN Take 3 mLs by nebulization every 6 (six) hours as needed.      isosorbide  mononitrate (IMDUR ) 30 MG 24 hr tablet Take 1 tablet (30 mg total) by mouth daily. 30 tablet 0    Lancets Misc. (ACCU-CHEK SOFTCLIX LANCET DEV) KIT Use to test blood sugar once daily 1 kit 0  losartan  (COZAAR ) 25 MG tablet Take 1 tablet (25 mg total) by mouth daily. 30 tablet 5    metFORMIN  (GLUCOPHAGE ) 500 MG tablet Take 2 tablets (1,000 mg total) by mouth daily with breakfast AND 1 tablet (500 mg total) daily with supper. 90 tablet 3    metoprolol  succinate (TOPROL -XL) 25 MG 24 hr tablet Take 1 tablet (25 mg total) by mouth daily. 90 tablet 3    nicotine  (NICODERM CQ  - DOSED IN MG/24 HOURS) 21 mg/24hr patch Place 1 patch (21 mg total) onto the skin daily. (Patient not taking: Reported on 10/25/2023) 28 patch 0    thiamine  (VITAMIN B1) 100 MG tablet Take 1 tablet (100 mg total) by mouth daily. 90 tablet 0    Tiotropium Bromide -Olodaterol 2.5-2.5 MCG/ACT AERS Inhale 2 puffs into the lungs daily. 4 g 3    torsemide  (DEMADEX ) 20 MG tablet Take 2 tablets (40 mg total) by mouth daily.      triamcinolone   cream (KENALOG ) 0.5 % Apply topically to legs twice daily for up to 2 weeks as needed for eczema 30 g 1    No current facility-administered medications for this encounter.   ROS  Review of Systems  Constitutional:  Positive for fatigue.  HENT:  Positive for sinus pressure and sneezing.   Respiratory:  Positive for cough and shortness of breath.   Cardiovascular:        Increased edema in legs  Gastrointestinal:  Positive for abdominal distention.       RLQ ileostomy Ventral hernia   Endocrine:       Diabetes  Musculoskeletal:  Positive for gait problem.       Uses wheelchair due to pain in feet and shortness of breath  Skin:  Positive for wound (nonhealing abdominal wound).  Neurological:  Positive for numbness.       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   All other systems reviewed and are negative.  Vital signs:  BP (!) (P) 142/83 (BP Location: Right Arm)   Pulse (P) 70   Temp (P) 98.4 F (36.9 C) (Oral)   Resp (P) 18   SpO2 (P) 90%  Exam:  Physical Exam Vitals reviewed.  Constitutional:      Appearance: Normal appearance. He is obese.  HENT:     Mouth/Throat:     Mouth: Mucous membranes are moist.  Cardiovascular:     Rate and Rhythm: Normal rate and regular rhythm.     Comments: Pacemaker in place Pulmonary:     Breath sounds: Wheezing present.     Comments: Increased shortness of breath and edema  Abdominal:     Hernia: A hernia is present.  Musculoskeletal:     Comments: weakness  Skin:    Comments: Nonhealing surgical wound to abdomen Medical adhesive related skin injury to periwound.   Neurological:     Mental Status: He is alert and oriented to person, place, and time. Mental status is at baseline.     Sensory: Sensory deficit present.  Psychiatric:        Behavior: Behavior normal.        Thought Content: Thought content normal.     Stoma type/location:  RLq end ileostomy Stomal assessment/size:  1"  flush Peristomal assessment:  denuded skin, creasing from hernia  Treatment options for stomal/peristomal skin: barrier ring and 1 piece flat pouch  Output: liquid tan stool Ostomy pouching: 1pc. flat Education provided:  wound care with aquacel and  ABD pads provided.  Skin to abdomen cleansed with VASHE and allowed to air dry.  Pouch change performed.     Impression/dx  Irritant contact dermatitis Medical adhesive related skin injury End ileostomy  Ventral hernia  Discussion  Ongoing wound care Would like to begin PT to gain strength and endurance Plan   See back as needed to assess wound healing and ostomy management in the setting of ventral hernia     Visit time: 55 minutes.   Branda Cain FNP-BC

## 2023-10-25 ENCOUNTER — Other Ambulatory Visit: Payer: Self-pay

## 2023-10-25 ENCOUNTER — Encounter (HOSPITAL_COMMUNITY)
Admission: RE | Admit: 2023-10-25 | Discharge: 2023-10-25 | Disposition: A | Discharge status: Home / Self Care | Source: Ambulatory Visit | Attending: Pulmonary Disease | Admitting: Pulmonary Disease

## 2023-10-25 ENCOUNTER — Encounter (HOSPITAL_COMMUNITY): Payer: Self-pay

## 2023-10-25 ENCOUNTER — Other Ambulatory Visit (HOSPITAL_COMMUNITY): Payer: Self-pay

## 2023-10-25 VITALS — BP 104/60 | HR 60 | Ht 73.0 in | Wt 237.9 lb

## 2023-10-25 DIAGNOSIS — J449 Chronic obstructive pulmonary disease, unspecified: Secondary | ICD-10-CM | POA: Diagnosis not present

## 2023-10-25 DIAGNOSIS — Z7689 Persons encountering health services in other specified circumstances: Secondary | ICD-10-CM | POA: Diagnosis not present

## 2023-10-25 DIAGNOSIS — Z932 Ileostomy status: Secondary | ICD-10-CM | POA: Diagnosis not present

## 2023-10-25 DIAGNOSIS — S31109A Unspecified open wound of abdominal wall, unspecified quadrant without penetration into peritoneal cavity, initial encounter: Secondary | ICD-10-CM | POA: Diagnosis not present

## 2023-10-25 NOTE — Progress Notes (Signed)
 Luis [name] 63 y.o. male  Pulmonary Rehab Orientation Note  This patient who was referred to Pulmonary Rehab by Dr. Oma Bias with the diagnosis of COPD 2 arrived today in Cardiac and Pulmonary Rehab. He arrived ambulatory with normal gait. He does not carry portable [name]. Per patient, Luis [name]. Patient is oriented to time and place. Patient's medical history, psychosocial health, and medications reviewed.   Psychosocial assessment reveals patient lives with alone. Luis [name], Luis [name]. Patient hobbies include watching tv. Patient reports his stress level is high. Areas of stress/anxiety include health. Patient does exhibit signs of possible depression. Signs of depression include helplessness and hopelessness and fatigue. PHQ2/9 score 2/6. Luis [name] shows fair  coping [name] with positive outlook on life. Offered emotional support and reassurance. Will continue to monitor and evaluate progress toward psychosocial goal(s) of decreased health related stress.   Physical assessment reveals: Well appearing, A&Ox4, c/o pain in his abd both from surgical wound and ostomy 5/10, pt states it's chronic pain Eyes/Ears: WNL  Lungs: Diminished with no wheezes, rales, rhonchi, chronic cough with mucous production, dyspnea on exertion, non-compliant with Cpap Heart: Regular rate rhythm, no murmurs, no rubs, no clicks Gastrointestinal: abdomin taught and distended, + bowel sounds in all 4 quads, colostomy to RLQ, dressing CDI to center of abdomen. Denies recent weight gain or loss, pt is not weighing daily. Will provide with daily weight log.  Genitourinary: WNL, pt states urinates frequently d/t diuretics Extremities:  +2 pulses, grip strength equal, strong, no edema, no cyanosis, no clubbing Integumentary: pt denies any rashes, open and non healing wounds to abdomen, BLE discolored Psy/Soc: Pt is disgruntled by pain he's been having in his abdomen both at surgical site/wound and  ostomy pain. Also endorses pain in bilateral hips from carrying weight in abdominal area. Pt irritated about eating a low salt diet and being on a fluid restriction. Complains of dry mouth and thirst. Educated about mints and ice chips.  Assistive devices: Pt denies.   Luis [name] reports he  does take medications as prescribed. Patient states he  follows a regular  diet. The patient has been trying to lose weight through a healthy diet and exercise program. Pt's weight will be monitored closely.   Demonstration and practice of PLB using pulse oximeter. Luis [name] able to return demonstration satisfactorily. Safety and hand hygiene in the exercise area reviewed with patient. Luis [name] voices understanding of the information reviewed. Department expectations discussed with patient and achievable goals were set. The patient shows enthusiasm about attending the program and we look forward to working with Luis [name]. Luis [name] completed a 6 min walk test today and is scheduled to begin exercise on 11/02/23 at 1315.   [id number] Luis [name]

## 2023-10-25 NOTE — Progress Notes (Signed)
 Luis [name] 62 y.o. male  Initial Psychosocial Assessment  Pt psychosocial assessment reveals pt lives alone. Pt is currently unemployed, disabled. Pt hobbies include watching tv. Pt reports his  stress level is high. Areas of stress/anxiety include health.  Pt does exhibit signs of possible depression. Signs of depression include helplessness and hopelessness and fatigue. Pt shows fair  coping skills with positive outlook. Offered emotional support and reassurance. Monitor and evaluate progress toward psychosocial goal(s).  Goal(s): Improved management of health related stress and possible depression Improved coping skills Help patient work toward returning to meaningful activities that improve patient's QOL and are attainable with patient's lung disease   10/25/2023 1:51 PM

## 2023-10-25 NOTE — Patient Instructions (Addendum)
 Visit Information  Thank you for taking time to visit with me today. Please don't hesitate to contact me if I can be of assistance to you before our next scheduled appointment.   Goals Addressed             This Visit's Progress    VBCI RN Care Plan-COPD       Problems:  Chronic Disease Management support and education needs related to COPD Lacks caregiver support. Non-adherence to prescribed medication regimen  Goal: Over the next 90 days the Patient will attend all scheduled medical appointments: including primary care provider 10/05/23; advance heart failure clinic 10/06/23;  as evidenced by patient report and review of chart        continue to work with RN Care Manager and/or Social Worker to address care management and care coordination needs related to COPD as evidenced by adherence to care management team scheduled appointments     demonstrate Improved adherence to prescribed treatment plan for COPD as evidenced by patient report and review of chart  Interventions:   COPD Interventions: Discussed the importance of adequate rest and management of fatigue with COPD Reviewed upcoming appointment with pulmonology-11/01/23 with Cullen Dose, NP Provided positive feedback regarding self health management Discussed the importance and benefits of attending pulmonary rehab in improving health status.  Patient Self-Care Activities:  Attend all scheduled provider appointments Call provider office for new concerns or questions  Take medications as prescribed   Work with the social worker to address care coordination needs and will continue to work with the clinical team to address health care and disease management related needs Attend pulmonary rehab sessions as scheduled.   Plan:  Telephone follow up appointment with care management team member scheduled for:  11/29/23     VBCI RN Care Plan-HF       Problems:  Chronic Disease Management support and education needs related to  CHF  Goal: Over the next 90 days the Patient will attend all scheduled medical appointments: including advance HF clinic scheduled for 10/05/23 as evidenced by patient report and review of chart        continue to work with RN Care Manager and/or Social Worker to address care management and care coordination needs related to CHF as evidenced by adherence to care management team scheduled appointments     demonstrate Improved adherence to prescribed treatment plan for CHF as evidenced by  patient report or review of chart  Interventions:   Heart Failure Interventions: Discussed the importance of keeping all appointments with provider Advised patient to continue to monitor weights and notify provider if worsening of condition weight today 273 lb (per patient took prn diuretic today). Reviewed upcoming appointments Provided positive feedback for self management Reviewed instructions per HF clinic visit 09/23/23. Reiterated -Take 20 mg of torsemide  as needed For weight gain of 3 lbs in 24 hours or 5 lbs in a week  Assigned Emmi education  Patient Self-Care Activities:  Attend all scheduled provider appointments Call provider office for new concerns or questions  Take medications as prescribed   Work with the social worker to address care coordination needs and will continue to work with the clinical team to address health care and disease management related needs watch for swelling in feet, ankles and legs every day and follow instructions per HF clinic for as needed torsemide . Do the following things EVERYDAY(per instructions of HF visit 10/24/23) Weigh yourself in the morning before breakfast. Write it down and keep it  in a log. Take your medicines as prescribed Eat low salt foods--Limit salt (sodium) to 2000 mg per day.  Stay as active as you can everyday Limit all fluids for the day to less than 2 liters Review Emmi educational material sent via email  Plan:  Telephone follow up  appointment with care management team member scheduled for:  11/29/23         Your next care management appointment is by telephone on 11/29/23 at 10:30 am  Please call the care guide team at [phone] if you need to cancel, schedule, or reschedule an appointment.   Please call the Suicide and Crisis Lifeline: 988 call the [place]  National Suicide Prevention Lifeline: 250 799 8649 or TTY: (330) 604-9601 TTY (504) 823-1605) to talk to a trained counselor if you are experiencing a Mental Health or Behavioral Health Crisis or need someone to talk to.  [name], RN, MSN, BSN, CCM [place]  [facility], Population Health Case Manager Phone: [phone]

## 2023-10-25 NOTE — Discharge Instructions (Signed)
 No changes

## 2023-10-25 NOTE — Patient Outreach (Signed)
 Complex Care Management   Visit Note  10/25/2023  Name:  Luis [name] MRN: [id number] DOB: 11-06-1960  Situation: Referral received for Complex Care Management related to Heart Failure and COPD I obtained verbal consent from Patient.  Visit completed with patient  on the phone  Background:   Past Medical History:  Diagnosis Date   Acute respiratory failure with hypoxia (HCC) 12/23/2022   Alcohol withdrawal syndrome, with delirium (HCC) 12/27/2022   COPD (chronic obstructive pulmonary disease) (HCC)    Diabetes mellitus without complication (HCC)    Diverticulitis    DVT (deep venous thrombosis) (HCC)    x3   Encephalopathy acute 12/28/2022   ETOH abuse    H/O colectomy    Hyperlipidemia    Hypertension    Sepsis associated hypotension (HCC) 08/03/2019   Smoker     Assessment: Patient Reported Symptoms:  Cognitive Cognitive Status: Alert and oriented to person, place, and time, Insightful and able to interpret abstract concepts, Normal speech and language skills      Neurological Neurological Review of Symptoms: Other: [place] Neurological Symptoms/Conditions [RPT]: bilateral foot neuropathy unchanged    HEENT HEENT Symptoms Reported: Not assessed      Cardiovascular Cardiovascular Symptoms Reported: Other: Other Cardiovascular Symptoms: patient reports weight increased today to 237lb and took additional diuretic. paitent denies any edema and reports SOB with activity. Does patient have uncontrolled Hypertension?: No Cardiovascular Conditions: Hypertension, Heart failure Cardiovascular Management Strategies: Medication therapy, Routine screening, Weight management, Exercise (patient reports will start pulmonary rehab today) Do You Have a Working Readable Scale?: Yes Weight: 237 lb (107.5 kg)  Respiratory Respiratory Symptoms Reported: Shortness of breath Other Respiratory Symptoms: report SOB with activity, Patient is scheduled to attend pulmonary rehab-start date  today. Respiratory Conditions: COPD  Endocrine Patient reports the following symptoms related to hypoglycemia or hyperglycemia : No symptoms reported Is patient diabetic?: Yes Is patient checking blood sugars at home?: Yes (does not routinely check BS) Endocrine Conditions: Diabetes Endocrine Management Strategies: Medication therapy, Routine screening  Gastrointestinal Gastrointestinal Symptoms Reported: Not assessed      Genitourinary Genitourinary Symptoms Reported: No symptoms reported Additional Genitourinary Details: RLQ ileostomy-is followed by Branda Cain, NP at ostomy clinic    Integumentary Integumentary Symptoms Reported: No symptoms reported Additional Integumentary Details: weekly visits with ostomy clinic    Musculoskeletal Musculoskelatal Symptoms Reviewed: No symptoms reported        Psychosocial Psychosocial Symptoms Reported: Not assessed            10/25/2023    1:46 PM  Depression screen PHQ 2/9  Decreased Interest 1  Down, Depressed, Hopeless 1  PHQ - 2 Score 2  Altered sleeping 1  Tired, decreased energy 1  Change in appetite 0  Feeling bad or failure about yourself  1  Trouble concentrating 1  Moving slowly or fidgety/restless 0  Suicidal thoughts 0  PHQ-9 Score 6  Difficult doing work/chores Somewhat difficult    There were no vitals filed for this visit.  Medications Reviewed Today   Medications were not reviewed in this encounter   He reports multiple clinic visits and states medications are reviewed during other provider visits. Patient refusing review of medications at this time.  Recommendation:   Specialty provider follow-up cardiac rehab 10/25/23, 11/01/23 pulmonology Continue to attend ostomy clinic visits as scheduled  Follow Up Plan:   Telephone follow up appointment date/time:  11/29/23 10:30 am  [name], RN, MSN, BSN, CCM [place]  [facility], [facility]  Case Manager Phone: [phone]

## 2023-10-25 NOTE — Progress Notes (Addendum)
 Pulmonary Individual Treatment Plan  Patient Details  Name: Luis [name] MRN: [id number] Date of Birth: Jul 25, 1960 Referring Provider:   Gattis Kass Pulmonary Rehab Walk Test from 10/25/2023 in [facility]  Referring Provider Dr. Oma Bias       Initial Encounter Date:  Flowsheet Row Pulmonary Rehab Walk Test from 10/25/2023 in [facility]  Date 10/25/23       Visit Diagnosis: Stage 2 moderate COPD by GOLD classification (HCC)  Patient's Home Medications on Admission:   Current Outpatient Medications:    Accu-Chek Softclix Lancets lancets, Use to test blood sugar once daily, Disp: 100 each, Rfl: 11   acetaminophen  (ACETAMINOPHEN  8 HOUR) 650 MG CR tablet, Take 1 tablet (650 mg total) by mouth every 8 (eight) hours as needed for pain (Patient taking differently: Take 1,300 mg by mouth every 8 (eight) hours as needed for pain or fever.), Disp: 90 tablet, Rfl: 1   amLODipine  (NORVASC ) 10 MG tablet, Take 1 tablet (10 mg total) by mouth daily., Disp: 90 tablet, Rfl: 0   ascorbic acid  (VITAMIN C ) 500 MG tablet, Take 1 tablet (500 mg total) by mouth daily., Disp: 30 tablet, Rfl: 1   atorvastatin  (LIPITOR) 10 MG tablet, Take 1 tablet (10 mg total) by mouth daily., Disp: 90 tablet, Rfl: 1   Blood Glucose Monitoring Suppl (ACCU-CHEK GUIDE) w/Device KIT, Use to test blood sugar once daily, Disp: 1 kit, Rfl: 0   cetirizine  (ZYRTEC ) 10 MG tablet, Take 1 tablet (10 mg total) by mouth daily., Disp: 90 tablet, Rfl: 1   dapagliflozin  propanediol (FARXIGA ) 10 MG TABS tablet, Take 1 tablet (10 mg total) by mouth daily before breakfast., Disp: 90 tablet, Rfl: 2   folic acid  (FOLVITE ) 1 MG tablet, Take 1 tablet (1 mg total) by mouth daily., Disp: 90 tablet, Rfl: 0   gabapentin  (NEURONTIN ) 300 MG capsule, Take 1 capsule (300 mg total) by mouth 2 (two) times daily. (Patient taking  differently: Take 600 mg by mouth at bedtime.), Disp: 90 capsule, Rfl: 3   glucose blood (ACCU-CHEK GUIDE TEST) test strip, Use to test blood sugar once daily, Disp: 100 each, Rfl: 11   guaiFENesin  (MUCINEX ) 600 MG 12 hr tablet, Take 1 tablet (600 mg total) by mouth 2 (two) times daily as needed to loosen phlegm or cough., Disp: 180 tablet, Rfl: 1   hydrOXYzine  (VISTARIL ) 50 MG capsule, Take 1 capsule (50 mg total) by mouth at bedtime as needed., Disp: 30 capsule, Rfl: 2   ipratropium-albuterol  (DUONEB) 0.5-2.5 (3) MG/3ML SOLN, Take 3 mLs by nebulization every 6 (six) hours as needed., Disp: , Rfl:    isosorbide  mononitrate (IMDUR ) 30 MG 24 hr tablet, Take 1 tablet (30 mg total) by mouth daily., Disp: 30 tablet, Rfl: 0   Lancets Misc. (ACCU-CHEK SOFTCLIX LANCET DEV) KIT, Use to test blood sugar once daily, Disp: 1 kit, Rfl: 0   losartan  (COZAAR ) 25 MG tablet, Take 1 tablet (25 mg total) by mouth daily., Disp: 30 tablet, Rfl: 5   metFORMIN  (GLUCOPHAGE ) 500 MG tablet, Take 2 tablets (1,000 mg total) by mouth daily with breakfast AND 1 tablet (500 mg total) daily with supper., Disp: 90 tablet, Rfl: 3   metoprolol  succinate (TOPROL -XL) 25 MG 24 hr tablet, Take 1 tablet (25 mg total) by mouth daily., Disp: 90 tablet, Rfl: 3   thiamine  (VITAMIN B1) 100 MG tablet, Take 1 tablet (100  mg total) by mouth daily., Disp: 90 tablet, Rfl: 0   Tiotropium Bromide -Olodaterol 2.5-2.5 MCG/ACT AERS, Inhale 2 puffs into the lungs daily., Disp: 4 g, Rfl: 3   torsemide  (DEMADEX ) 20 MG tablet, Take 2 tablets (40 mg total) by mouth daily., Disp: , Rfl:    triamcinolone  cream (KENALOG ) 0.5 %, Apply topically to legs twice daily for up to 2 weeks as needed for eczema, Disp: 30 g, Rfl: 1   nicotine  (NICODERM CQ  - DOSED IN MG/24 HOURS) 21 mg/24hr patch, Place 1 patch (21 mg total) onto the skin daily. (Patient not taking: Reported on 10/25/2023), Disp: 28 patch, Rfl: 0  Past Medical History: Past Medical History:  Diagnosis Date    Acute respiratory failure with hypoxia (HCC) 12/23/2022   Alcohol withdrawal syndrome, with delirium (HCC) 12/27/2022   COPD (chronic obstructive pulmonary disease) (HCC)    Diabetes mellitus without complication (HCC)    Diverticulitis    DVT (deep venous thrombosis) (HCC)    x3   Encephalopathy acute 12/28/2022   ETOH abuse    H/O colectomy    Hyperlipidemia    Hypertension    Sepsis associated hypotension (HCC) 08/03/2019   Smoker     Tobacco Use: Social History   Tobacco Use  Smoking Status Every Day   Current packs/day: 1.00   Average packs/day: 1 pack/day for 44.0 years (44.0 ttl pk-yrs)   Types: Cigarettes  Smokeless Tobacco Current  Tobacco Comments   Less than .5pk daily 10/03/23    Labs: Review Flowsheet  More data exists      Latest Ref Rng & Units 12/25/2022 12/26/2022 02/16/2023 02/17/2023 08/06/2023  Labs for ITP Cardiac and Pulmonary Rehab  Trlycerides <150 mg/dL - 604  - - -  Hemoglobin A1c 4.8 - 5.6 % - - 7.0  - 7.3   PH, Arterial 7.35 - 7.45 7.376  7.197  7.28  - 7.44  - -  PCO2 arterial 32 - 48 mmHg 41.6  72.0  56  - 46  - -  Bicarbonate 20.0 - 28.0 mmol/L 24.4  28.1  26.3  - 31.9  30.8  -  TCO2 22 - 32 mmol/L 26  30  - - - -  Acid-base deficit 0.0 - 2.0 mmol/L 1.0  2.0  1.4  - - - -  O2 Saturation % 99  93  93.2  - 66.3  82.2  -    Details       Multiple values from one day are sorted in reverse-chronological order         Capillary Blood Glucose: Lab Results  Component Value Date   GLUCAP 134 (H) 08/14/2023   GLUCAP 216 (H) 08/14/2023   GLUCAP 199 (H) 08/13/2023   GLUCAP 310 (H) 08/13/2023   GLUCAP 121 (H) 08/13/2023    POCT Glucose     Row Name 10/25/23 1330             POCT Blood Glucose   Pre-Exercise 208 mg/dL                Pulmonary Assessment Scores:  Pulmonary Assessment Scores     Row Name 10/25/23 1341         ADL UCSD   ADL Phase Entry     SOB Score total 39       CAT Score   CAT Score 16        mMRC Score   mMRC Score 4  UCSD: Self-administered rating of dyspnea associated with activities of daily living (ADLs) 6-point scale (0 = "not at all" to 5 = "maximal or unable to do because of breathlessness")  Scoring Scores range from 0 to 120.  Minimally important difference is 5 units  CAT: CAT can identify the health impairment of COPD patients and is better correlated with disease progression.  CAT has a scoring range of zero to 40. The CAT score is classified into four groups of low (less than 10), medium (10 - 20), high (21-30) and very high (31-40) based on the impact level of disease on health status. A CAT score over 10 suggests significant symptoms.  A worsening CAT score could be explained by an exacerbation, poor medication adherence, poor inhaler technique, or progression of COPD or comorbid conditions.  CAT MCID is 2 points  mMRC: mMRC (Modified Medical Research Council) Dyspnea Scale is used to assess the degree of baseline functional disability in patients of respiratory disease due to dyspnea. No minimal important difference is established. A decrease in score of 1 point or greater is considered a positive change.   Pulmonary Function Assessment:  Pulmonary Function Assessment - 10/25/23 1341       Breath   Bilateral Breath Sounds Decreased    Shortness of Breath Yes;Limiting activity;Fear of Shortness of Breath             Exercise Target Goals: Exercise Program Goal: Individual exercise prescription set using results from initial 6 min walk test and THRR while considering  patient's activity barriers and safety.   Exercise Prescription Goal: Initial exercise prescription builds to 30-45 minutes a day of aerobic activity, 2-3 days per week.  Home exercise guidelines will be given to patient during program as part of exercise prescription that the participant will acknowledge.  Activity Barriers & Risk Stratification:  Activity Barriers &  Cardiac Risk Stratification - 10/25/23 1338       Activity Barriers & Cardiac Risk Stratification   Activity Barriers Deconditioning;Muscular Weakness;Shortness of Breath;Back Problems;Assistive Device             6 Minute Walk:  6 Minute Walk     Row Name 10/25/23 1510         6 Minute Walk   Phase Initial     Distance 653 feet     Walk Time 6 minutes     # of Rest Breaks 1  [id number]     MPH 1.24     METS 1.71     RPE 13     Perceived Dyspnea  0     VO2 Peak 5.99     Symptoms Yes (comment)     Comments bilateral hip pain     Resting HR 60 bpm     Resting BP 104/60     Resting Oxygen Saturation  94 %     Exercise Oxygen Saturation  during 6 min walk 90 %     Max Ex. HR 84 bpm     Max Ex. BP 142/68     2 Minute Post BP 130/66       Interval HR   1 Minute HR 63     2 Minute HR 67     3 Minute HR 78     4 Minute HR 77     5 Minute HR 80     6 Minute HR 84     2 Minute Post HR 67     Interval Heart Rate?  Yes       Interval Oxygen   Interval Oxygen? Yes     Baseline Oxygen Saturation % 94 %     1 Minute Oxygen Saturation % 92 %     1 Minute Liters of Oxygen 0 L     2 Minute Oxygen Saturation % 91 %     2 Minute Liters of Oxygen 0 L     3 Minute Oxygen Saturation % 90 %     3 Minute Liters of Oxygen 0 L     4 Minute Oxygen Saturation % 90 %     4 Minute Liters of Oxygen 0 L     5 Minute Oxygen Saturation % 91 %     5 Minute Liters of Oxygen 0 L     6 Minute Oxygen Saturation % 90 %     6 Minute Liters of Oxygen 0 L     2 Minute Post Oxygen Saturation % 94 %     2 Minute Post Liters of Oxygen 0 L              Oxygen Initial Assessment:  Oxygen Initial Assessment - 10/25/23 1340       Home Oxygen   Home Oxygen Device None    Sleep Oxygen Prescription None    Home Exercise Oxygen Prescription None    Home Resting Oxygen Prescription None      Initial 6 min Walk   Oxygen Used None      Program Oxygen Prescription   Program Oxygen  Prescription None      Intervention   Short Term Goals To learn and understand importance of maintaining oxygen saturations>88%;To learn and understand importance of monitoring SPO2 with pulse oximeter and demonstrate accurate use of the pulse oximeter.;To learn and demonstrate proper pursed lip breathing techniques or other breathing techniques.     Long  Term Goals Exhibits proper breathing techniques, such as pursed lip breathing or other method taught during program session;Verbalizes importance of monitoring SPO2 with pulse oximeter and return demonstration;Compliance with respiratory medication;Maintenance of O2 saturations>88%             Oxygen Re-Evaluation:   Oxygen Discharge (Final Oxygen Re-Evaluation):   Initial Exercise Prescription:  Initial Exercise Prescription - 10/25/23 1500       Date of Initial Exercise RX and Referring Provider   Date 10/25/23    Referring Provider Dr. Oma Bias    Expected Discharge Date 01/23/24      Arm Ergometer   Level 1    Minutes 15    METs 1.5      Track   Laps 4    Minutes 15    METs 1.62      Prescription Details   Frequency (times per week) 2    Duration Progress to 30 minutes of continuous aerobic without signs/symptoms of physical distress      Intensity   THRR 40-80% of Max Heartrate 62-125    Ratings of Perceived Exertion 11-13    Perceived Dyspnea 0-4      Progression   Progression Continue to progress workloads to maintain intensity without signs/symptoms of physical distress.      Resistance Training   Training Prescription Yes    Weight red bands    Reps 10-15             Perform Capillary Blood Glucose checks as needed.  Exercise Prescription Changes:   Exercise Comments:   Exercise Goals and  Review:   Exercise Goals     Row Name 10/25/23 1340             Exercise Goals   Increase Physical Activity Yes       Intervention Provide advice, education, support and counseling about  physical activity/exercise needs.;Develop an individualized exercise prescription for aerobic and resistive training based on initial evaluation findings, risk stratification, comorbidities and participant's personal goals.       Expected Outcomes Short Term: Attend rehab on a regular basis to increase amount of physical activity.;Long Term: Exercising regularly at least 3-5 days a week.;Long Term: Add in home exercise to make exercise part of routine and to increase amount of physical activity.       Increase Strength and Stamina Yes       Intervention Provide advice, education, support and counseling about physical activity/exercise needs.;Develop an individualized exercise prescription for aerobic and resistive training based on initial evaluation findings, risk stratification, comorbidities and participant's personal goals.       Expected Outcomes Short Term: Increase workloads from initial exercise prescription for resistance, speed, and METs.;Short Term: Perform resistance training exercises routinely during rehab and add in resistance training at home;Long Term: Improve cardiorespiratory fitness, muscular endurance and strength as measured by increased METs and functional capacity ( )       Able to understand and use rate of perceived exertion (RPE) scale Yes       Intervention Provide education and explanation on how to use RPE scale       Expected Outcomes Short Term: Able to use RPE daily in rehab to express subjective intensity level;Long Term:  Able to use RPE to guide intensity level when exercising independently       Able to understand and use Dyspnea scale Yes       Intervention Provide education and explanation on how to use Dyspnea scale       Expected Outcomes Short Term: Able to use Dyspnea scale daily in rehab to express subjective sense of shortness of breath during exertion;Long Term: Able to use Dyspnea scale to guide intensity level when exercising independently       Knowledge  and understanding of Target Heart Rate Range (THRR) Yes       Intervention Provide education and explanation of THRR including how the numbers were predicted and where they are located for reference       Expected Outcomes Short Term: Able to state/look up THRR;Long Term: Able to use THRR to govern intensity when exercising independently;Short Term: Able to use daily as guideline for intensity in rehab       Understanding of Exercise Prescription Yes       Intervention Provide education, explanation, and written materials on patient's individual exercise prescription       Expected Outcomes Short Term: Able to explain program exercise prescription;Long Term: Able to explain home exercise prescription to exercise independently                Exercise Goals Re-Evaluation :   Discharge Exercise Prescription (Final Exercise Prescription Changes):   Nutrition:  Target Goals: Understanding of nutrition guidelines, daily intake of sodium 1500mg , cholesterol 200mg , calories 30% from fat and 7% or less from saturated fats, daily to have 5 or more servings of fruits and vegetables.  Biometrics:    Nutrition Therapy Plan and Nutrition Goals:   Nutrition Assessments:  MEDIFICTS Score Key: >=70 Need to make dietary changes  40-70 Heart Healthy Diet <= 40 Therapeutic  Level Cholesterol Diet   Picture Your Plate Scores: <16 Unhealthy dietary pattern with much room for improvement. 41-50 Dietary pattern unlikely to meet recommendations for good health and room for improvement. 51-60 More healthful dietary pattern, with some room for improvement.  >60 Healthy dietary pattern, although there may be some specific behaviors that could be improved.    Nutrition Goals Re-Evaluation:   Nutrition Goals Discharge (Final Nutrition Goals Re-Evaluation):   Psychosocial: Target Goals: Acknowledge presence or absence of significant depression and/or stress, maximize coping skills, provide  positive support system. Participant is able to verbalize types and ability to use techniques and skills needed for reducing stress and depression.  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 He also states he has chronic pain from both.      Family Dynamics   Good Support System? Yes      Barriers   Psychosocial barriers to participate in program The patient should benefit from training in stress management and relaxation.      Screening Interventions   Interventions Encouraged to exercise;Provide feedback about the scores to participant;To provide support and resources with identified psychosocial needs    Expected Outcomes Short Term goal: Utilizing psychosocial counselor, staff and physician to assist with identification of specific Stressors or current issues interfering with healing process. Setting desired goal for each stressor or current issue identified.;Long Term Goal: Stressors or current issues are controlled or eliminated.;Short Term goal: Identification and review with participant of any Quality of Life or Depression concerns found by scoring the questionnaire.;Long Term goal: The participant improves quality of Life and PHQ9 Scores as seen by post scores and/or verbalization of changes             Quality of Life Scores:  Scores of 19 and below usually indicate a poorer quality of life in these areas.  A difference of  2-3 points is a clinically meaningful difference.  A difference of 2-3 points in the total score of the Quality of Life Index has been  associated with significant improvement in overall quality of life, self-image, physical symptoms, and general health in studies assessing change in quality of life.  PHQ-9: Review Flowsheet  More data exists      10/25/2023 10/20/2023 10/12/2023 06/15/2023 04/18/2023  Depression screen PHQ 2/9  Decreased Interest 1 0 0 0 3  Down, Depressed, Hopeless 1 - 3 0 3  PHQ - 2 Score 2 0 3 0 6  Altered sleeping 1 - 2 - 3  Tired, decreased energy 1 - 2 - 3  Change in appetite 0 - 0 - 0  Feeling bad or failure about yourself  1 - 1 - 1  Trouble concentrating 1 - 1 - 1  Moving slowly or fidgety/restless 0 - 0 - 0  Suicidal thoughts 0 - 0 - 0  PHQ-9 Score 6 - 9 - 14  Difficult doing work/chores Somewhat difficult - Very difficult - Very difficult   Interpretation of Total Score  Total Score Depression Severity:  1-4 = Minimal depression, 5-9 = Mild depression, 10-14 = Moderate  depression, 15-19 = Moderately severe depression, 20-27 = Severe depression   Psychosocial Evaluation and Intervention:  Psychosocial Evaluation - 10/25/23 1344       Psychosocial Evaluation & Interventions   Interventions Stress management education;Encouraged to exercise with the program and follow exercise prescription    Comments Pt scored a 6 on PHQ-9. Pt denies current possible depression although speaking with patient there are possible signs. Pt currently declines any additional resources or referrals. 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 He also states he has chronic pain from both. We will continue to assess and monitor his needs.    Expected Outcomes For Kaeson to attend pulmonary rehab and to have a positive outlook and good coping skills to manage his possible depression and stress.    Continue Psychosocial Services  Follow up required by staff             Psychosocial Re-Evaluation:   Psychosocial  Discharge (Final Psychosocial Re-Evaluation):   Education: Education Goals: Education classes will be provided on a weekly basis, covering required topics. Participant will state understanding/return demonstration of topics presented.  Learning Barriers/Preferences:  Learning Barriers/Preferences - 10/25/23 1344       Learning Barriers/Preferences   Learning Barriers Sight;Exercise Concerns    Learning Preferences None             Education Topics: Know Your Numbers Group instruction that is supported by a PowerPoint presentation. Instructor discusses importance of knowing and understanding resting, exercise, and post-exercise oxygen saturation, heart rate, and blood pressure. Oxygen saturation, heart rate, blood pressure, rating of perceived exertion, and dyspnea are reviewed along with a normal range for these values.    Exercise for the Pulmonary Patient Group instruction that is supported by a PowerPoint presentation. Instructor discusses benefits of exercise, core components of exercise, frequency, duration, and intensity of an exercise routine, importance of utilizing pulse oximetry during exercise, safety while exercising, and options of places to exercise outside of rehab.    MET Level  Group instruction provided by PowerPoint, verbal discussion, and written material to support subject matter. Instructor reviews what METs are and how to increase METs.    Pulmonary Medications Verbally interactive group education provided by instructor with focus on inhaled medications and proper administration.   Anatomy and Physiology of the Respiratory System Group instruction provided by PowerPoint, verbal discussion, and written material to support subject matter. Instructor reviews respiratory cycle and anatomical components of the respiratory system and their functions. Instructor also reviews differences in obstructive and restrictive respiratory diseases with examples of each.     Oxygen Safety Group instruction provided by PowerPoint, verbal discussion, and written material to support subject matter. There is an overview of "What is Oxygen" and "Why do we need it".  Instructor also reviews how to create a safe environment for oxygen use, the importance of using oxygen as prescribed, and the risks of noncompliance. There is a brief discussion on traveling with oxygen and resources the patient may utilize.   Oxygen Use Group instruction provided by PowerPoint, verbal discussion, and written material to discuss how supplemental oxygen is prescribed and different types of oxygen supply systems. Resources for more information are provided.    Breathing Techniques Group instruction that is supported by demonstration and informational handouts. Instructor discusses the benefits of pursed lip and diaphragmatic breathing and detailed demonstration on how to perform both.  Risk Factor Reduction Group instruction that is supported by a PowerPoint presentation. Instructor discusses the definition of a risk factor, different risk factors for pulmonary disease, and how the heart and lungs work together.   Pulmonary Diseases Group instruction provided by PowerPoint, verbal discussion, and written material to support subject matter. Instructor gives an overview of the different type of pulmonary diseases. There is also a discussion on risk factors and symptoms as well as ways to manage the diseases.   Stress and Energy Conservation Group instruction provided by PowerPoint, verbal discussion, and written material to support subject matter. Instructor gives an overview of stress and the impact it can have on the body. Instructor also reviews ways to reduce stress. There is also a discussion on energy conservation and ways to conserve energy throughout the day.   Warning Signs and Symptoms Group instruction provided by PowerPoint, verbal discussion, and written material to  support subject matter. Instructor reviews warning signs and symptoms of stroke, heart attack, cold and flu. Instructor also reviews ways to prevent the spread of infection.   Other Education Group or individual verbal, written, or video instructions that support the educational goals of the pulmonary rehab program.    Knowledge Questionnaire Score:  Knowledge Questionnaire Score - 10/25/23 1546       Knowledge Questionnaire Score   Pre Score 12/18             Core Components/Risk Factors/Patient Goals at Admission:  Personal Goals and Risk Factors at Admission - 10/25/23 1344       Core Components/Risk Factors/Patient Goals on Admission    Weight Management Yes;Obesity;Weight Loss    Intervention Weight Management: Develop a combined nutrition and exercise program designed to reach desired caloric intake, while maintaining appropriate intake of nutrient and fiber, sodium and fats, and appropriate energy expenditure required for the weight goal.;Weight Management: Provide education and appropriate resources to help participant work on and attain dietary goals.;Weight Management/Obesity: Establish reasonable short term and long term weight goals.;Obesity: Provide education and appropriate resources to help participant work on and attain dietary goals.    Admit Weight 69 lb 2.9 oz (31.4 kg)    Expected Outcomes Short Term: Continue to assess and modify interventions until short term weight is achieved;Long Term: Adherence to nutrition and physical activity/exercise program aimed toward attainment of established weight goal;Weight Loss: Understanding of general recommendations for a balanced deficit meal plan, which promotes 1-2 lb weight loss per week and includes a negative energy balance of [phone] kcal/d;Understanding recommendations for meals to include 15-35% energy as protein, 25-35% energy from fat, 35-60% energy from carbohydrates, less than 200mg  of dietary cholesterol, 20-35 gm  of total fiber daily;Understanding of distribution of calorie intake throughout the day with the consumption of 4-5 meals/snacks    Tobacco Cessation Yes    Number of packs per day 1/2    Intervention Assist the participant in steps to quit. Provide individualized education and counseling about committing to Tobacco Cessation, relapse prevention, and pharmacological support that can be provided by physician.;Education officer, environmental, assist with locating and accessing local/national Quit Smoking programs, and support quit date choice.    Expected Outcomes Short Term: Will demonstrate readiness to quit, by selecting a quit date.;Long Term: Complete abstinence from all tobacco products for at least 12 months from quit date.;Short Term: Will quit all tobacco product use, adhering to prevention of relapse plan.    Improve shortness of breath with ADL's Yes    Intervention Provide education, individualized  exercise plan and daily activity instruction to help decrease symptoms of SOB with activities of daily living.    Expected Outcomes Short Term: Improve cardiorespiratory fitness to achieve a reduction of symptoms when performing ADLs;Long Term: Be able to perform more ADLs without symptoms or delay the onset of symptoms    Stress Yes    Intervention Offer individual and/or small group education and counseling on adjustment to heart disease, stress management and health-related lifestyle change. Teach and support self-help strategies.;Refer participants experiencing significant psychosocial distress to appropriate mental health specialists for further evaluation and treatment. When possible, include family members and significant others in education/counseling sessions.    Expected Outcomes Short Term: Participant demonstrates changes in health-related behavior, relaxation and other stress management skills, ability to obtain effective social support, and compliance with psychotropic medications if  prescribed.;Long Term: Emotional wellbeing is indicated by absence of clinically significant psychosocial distress or social isolation.             Core Components/Risk Factors/Patient Goals Review:    Core Components/Risk Factors/Patient Goals at Discharge (Final Review):    ITP Comments:   Comments: Dr. Genetta Kenning is Medical Director for Pulmonary Rehab at [facility].

## 2023-10-30 ENCOUNTER — Other Ambulatory Visit (HOSPITAL_COMMUNITY): Payer: Self-pay

## 2023-10-31 ENCOUNTER — Encounter: Payer: Self-pay | Admitting: *Deleted

## 2023-10-31 ENCOUNTER — Ambulatory Visit (HOSPITAL_COMMUNITY)
Admission: RE | Admit: 2023-10-31 | Discharge: 2023-10-31 | Disposition: A | Discharge status: Home / Self Care | Source: Ambulatory Visit | Attending: Nurse Practitioner | Admitting: Nurse Practitioner

## 2023-10-31 DIAGNOSIS — T8189XD Other complications of procedures, not elsewhere classified, subsequent encounter: Secondary | ICD-10-CM | POA: Diagnosis not present

## 2023-10-31 DIAGNOSIS — Y832 Surgical operation with anastomosis, bypass or graft as the cause of abnormal reaction of the patient, or of later complication, without mention of misadventure at the time of the procedure: Secondary | ICD-10-CM | POA: Insufficient documentation

## 2023-10-31 DIAGNOSIS — L24B3 Irritant contact dermatitis related to fecal or urinary stoma or fistula: Secondary | ICD-10-CM | POA: Diagnosis not present

## 2023-10-31 DIAGNOSIS — T8189XA Other complications of procedures, not elsewhere classified, initial encounter: Secondary | ICD-10-CM | POA: Diagnosis not present

## 2023-10-31 DIAGNOSIS — K9419 Other complications of enterostomy: Secondary | ICD-10-CM | POA: Insufficient documentation

## 2023-10-31 DIAGNOSIS — Z7689 Persons encountering health services in other specified circumstances: Secondary | ICD-10-CM | POA: Diagnosis not present

## 2023-10-31 DIAGNOSIS — K439 Ventral hernia without obstruction or gangrene: Secondary | ICD-10-CM | POA: Diagnosis not present

## 2023-10-31 DIAGNOSIS — Z432 Encounter for attention to ileostomy: Secondary | ICD-10-CM

## 2023-10-31 NOTE — Progress Notes (Signed)
 [facility]   Reason for visit:  RLQ ileostomy, flush Ventral hernia Midline abdominal wounds, nonhealing.  HPI:  Perforated diverticulum with end ileostomy Past Medical History:  Diagnosis Date   Acute respiratory failure with hypoxia (HCC) 12/23/2022   Alcohol withdrawal syndrome, with delirium (HCC) 12/27/2022   COPD (chronic obstructive pulmonary disease) (HCC)    Diabetes mellitus without complication (HCC)    Diverticulitis    DVT (deep venous thrombosis) (HCC)    x3   Encephalopathy acute 12/28/2022   ETOH abuse    H/O colectomy    Hyperlipidemia    Hypertension    Sepsis associated hypotension (HCC) 08/03/2019   Smoker    Family History  Problem Relation Age of Onset   COPD Mother    Prostate cancer Father    Colon cancer Neg Hx    Rectal cancer Neg Hx    Stomach cancer Neg Hx    Esophageal cancer Neg Hx    Allergies  Allergen Reactions   Lisinopril     Other reaction(s): renal effects   Codeine Other (See Comments) and Hives    unknown Other reaction(s): Other (See Comments) unknown unknown   Current Outpatient Medications  Medication Sig Dispense Refill Last Dose/Taking   Accu-Chek Softclix Lancets lancets Use to test blood sugar once daily 100 each 11    acetaminophen  (ACETAMINOPHEN  8 HOUR) 650 MG CR tablet Take 1 tablet (650 mg total) by mouth every 8 (eight) hours as needed for pain (Patient taking differently: Take 1,300 mg by mouth every 8 (eight) hours as needed for pain or fever.) 90 tablet 1    amLODipine  (NORVASC ) 10 MG tablet Take 1 tablet (10 mg total) by mouth daily. 90 tablet 0    ascorbic acid  (VITAMIN C ) 500 MG tablet Take 1 tablet (500 mg total) by mouth daily. 30 tablet 1    atorvastatin  (LIPITOR) 10 MG tablet Take 1 tablet (10 mg total) by mouth daily. 90 tablet 1    Blood Glucose Monitoring Suppl (ACCU-CHEK GUIDE) w/Device KIT Use to test blood sugar once daily 1 kit 0    cetirizine  (ZYRTEC ) 10 MG tablet Take 1 tablet (10  mg total) by mouth daily. 90 tablet 1    dapagliflozin  propanediol (FARXIGA ) 10 MG TABS tablet Take 1 tablet (10 mg total) by mouth daily before breakfast. 90 tablet 2    folic acid  (FOLVITE ) 1 MG tablet Take 1 tablet (1 mg total) by mouth daily. 90 tablet 0    gabapentin  (NEURONTIN ) 300 MG capsule Take 1 capsule (300 mg total) by mouth 2 (two) times daily. (Patient taking differently: Take 600 mg by mouth at bedtime.) 90 capsule 3    glucose blood (ACCU-CHEK GUIDE TEST) test strip Use to test blood sugar once daily 100 each 11    guaiFENesin  (MUCINEX ) 600 MG 12 hr tablet Take 1 tablet (600 mg total) by mouth 2 (two) times daily as needed to loosen phlegm or cough. 180 tablet 1    hydrOXYzine  (VISTARIL ) 50 MG capsule Take 1 capsule (50 mg total) by mouth at bedtime as needed. 30 capsule 2    ipratropium-albuterol  (DUONEB) 0.5-2.5 (3) MG/3ML SOLN Take 3 mLs by nebulization every 6 (six) hours as needed.      isosorbide  mononitrate (IMDUR ) 30 MG 24 hr tablet Take 1 tablet (30 mg total) by mouth daily. 30 tablet 0    Lancets Misc. (ACCU-CHEK SOFTCLIX LANCET DEV) KIT Use to test blood sugar once daily 1 kit 0  losartan  (COZAAR ) 25 MG tablet Take 1 tablet (25 mg total) by mouth daily. 30 tablet 5    metFORMIN  (GLUCOPHAGE ) 500 MG tablet Take 2 tablets (1,000 mg total) by mouth daily with breakfast AND 1 tablet (500 mg total) daily with supper. 90 tablet 3    metoprolol  succinate (TOPROL -XL) 25 MG 24 hr tablet Take 1 tablet (25 mg total) by mouth daily. 90 tablet 3    nicotine  (NICODERM CQ  - DOSED IN MG/24 HOURS) 21 mg/24hr patch Place 1 patch (21 mg total) onto the skin daily. (Patient not taking: Reported on 11/01/2023) 28 patch 0    thiamine  (VITAMIN B1) 100 MG tablet Take 1 tablet (100 mg total) by mouth daily. 90 tablet 0    Tiotropium Bromide -Olodaterol 2.5-2.5 MCG/ACT AERS Inhale 2 puffs into the lungs daily. 4 g 3    torsemide  (DEMADEX ) 20 MG tablet Take 2 tablets (40 mg total) by mouth daily.       triamcinolone  cream (KENALOG ) 0.5 % Apply topically to legs twice daily for up to 2 weeks as needed for eczema 30 g 1    No current facility-administered medications for this encounter.   ROS  Review of Systems  Constitutional:  Positive for fatigue.  Respiratory:  Positive for shortness of breath.   Cardiovascular:        Has packemaker  Gastrointestinal:        Ileostomy Ventral hernia   Endocrine:       Diabetes mellitus  Musculoskeletal:  Positive for gait problem.  Neurological:  Positive for numbness.       Neuropathy  Psychiatric/Behavioral:  Positive for dysphoric mood.        Continues to strive for alcohol and smoking cessation  All other systems reviewed and are negative.  Vital signs:  BP (!) 141/81   Pulse 61   Temp 97.7 F (36.5 C) (Oral)   Resp 20   SpO2 95%  Exam:  Physical Exam Vitals reviewed.  Constitutional:      Appearance: He is obese.  HENT:     Mouth/Throat:     Mouth: Mucous membranes are moist.  Cardiovascular:     Rate and Rhythm: Normal rate and regular rhythm.     Pulses: Normal pulses.     Heart sounds: Normal heart sounds.  Abdominal:     Hernia: A hernia is present.     Comments: Ileostomy Nonhealing abdominal wound  Musculoskeletal:     Comments: weakness  Skin:    General: Skin is warm and dry.     Findings: Erythema, lesion and rash present.  Neurological:     Mental Status: He is alert and oriented to person, place, and time. Mental status is at baseline.     Sensory: Sensory deficit present.  Psychiatric:        Behavior: Behavior normal.     Stoma type/location:  RLQ ileostomy Stomal assessment/size:  1" flush with ventral hernia to abdomen complicating pouching and seal.  Peristomal assessment:  barrier ring 1 piece flat  skin tac adhesive Treatment options for stomal/peristomal skin: 1 piece flat  Output: liquid brown stool Ostomy pouching: 1pc. Education provided:  wound care to abdominal wound with Aquacel   after cleansing with VASHE  desitin to irritation on abdominal pannus    Impression/dx  Medical adhesive related skin injury to periwound skin from tape.  Nonhealing wound Ventral hernia ileostomy Discussion  No changes.  See back as needed. Follow up with cardiology regarding increased shortness  of breath- none noted today. To begin outpatient physical therapy   Plan  Continue ostomy and wound care     Visit time: 45 minutes.   Branda Cain FNP-BC

## 2023-10-31 NOTE — Patient Outreach (Addendum)
 Completed request form for independent assessment for personal care service received from patient's providers office and faxed to [employer] for processing    [employer], LCSW [place]  [facility], [facility] Licensed Clinical Social Worker Care Coordinator  Direct Dial: [phone]

## 2023-11-01 ENCOUNTER — Encounter: Admitting: Adult Health

## 2023-11-01 ENCOUNTER — Other Ambulatory Visit: Payer: Self-pay | Admitting: *Deleted

## 2023-11-01 NOTE — Patient Instructions (Signed)
 Visit Information  Luis [name] was given information about Medicaid Managed Care team care coordination services as a part of their [facility] Medicaid benefit. Luis [name] verbally consented to engagement with the [facility] Managed Care team.   If you are experiencing a medical emergency, please call 911 or report to your local emergency department or urgent care.   If you have a non-emergency medical problem during routine business hours, please contact your provider's office and ask to speak with a nurse.   For questions related to your [facility] health plan, please call: [phone] or go here:[email or web address]  If you would like to schedule transportation through your [facility] plan, please call the following number at least 2 days in advance of your appointment: [phone].   You can also use the MTM portal or MTM mobile app to manage your rides. Reimbursement for transportation is available through [facility]! For the portal, please go to [email or web address]  Call the [facility] Crisis Line at [phone], at any time, 24 hours a day, 7 days a week. If you are in danger or need immediate medical attention call 911.  If you would like help to quit smoking, call 1-800-QUIT-NOW ((413) 422-1462) OR Espaol: 1-855-Djelo-Ya (4-132-440-1027) o para ms informacin haga clic aqu or Text READY to 253-664 to register via text  Luis [name] - following are the goals we discussed in your visit today:   Goals Addressed             This Visit's Progress    VBCI Social Work Care Plan LCSW       Problems:   Level of Care Concerns:Inability to perform ADL's independently  CSW Clinical Goal(s):   Over the next 90 days the Patient will work with CSW to address needs related to in home care support.  Interventions:  Level of Care Concerns in a patient with congestive heart failure Current level of care: home, alone Evaluation of patient's unmet needs in  current living environment ADL's Assessed needs, level of care concerns, how currently meeting needs and barriers to care Confirmed that patient is agreeable to beginning process for applying personal care services CSW to collaborate with primary care provider regarding completion of request forms for personal care services, however referral will need to be generated through patient's managed medicaid plan Patient's managed medicaid plan to be contacted to complete referral for personal care services([employer]) Patient does not have an Advanced Directive- AD packet mailed to patient's home for review-review of document pending  Patient Goals/Self-Care Activities:  Continue taking your medication as prescribed.   Patient to expect a call from [facility] to discuss in home assessment for personal care services  Plan:   Telephone follow up appointment with care management team member scheduled for:  11/15/23      Patient verbalizes understanding of instructions and care plan provided today and agrees to view in MyChart. Active MyChart status and patient understanding of how to access instructions and care plan via MyChart confirmed with patient.     Social Worker will follow up with Luis [name] regarding personal care services.   Luis Bowe, LCSW [place]  [facility], [facility] Licensed Clinical Social Worker Care Coordinator  Direct Dial: [phone]     Following is a copy of your plan of care:  There are no care plans that you recently modified to display for this patient.

## 2023-11-01 NOTE — Patient Outreach (Signed)
 Complex Care Management   Visit Note  11/01/2023  Name:  Luis [name] MRN: [id number] DOB: 03-30-1961  Situation: Referral received for Complex Care Management related to in home care needs I obtained verbal consent from Patient.  Visit completed with patient  on the phone  Background:   Past Medical History:  Diagnosis Date   Acute respiratory failure with hypoxia (HCC) 12/23/2022   Alcohol withdrawal syndrome, with delirium (HCC) 12/27/2022   COPD (chronic obstructive pulmonary disease) (HCC)    Diabetes mellitus without complication (HCC)    Diverticulitis    DVT (deep venous thrombosis) (HCC)    x3   Encephalopathy acute 12/28/2022   ETOH abuse    H/O colectomy    Hyperlipidemia    Hypertension    Sepsis associated hypotension (HCC) 08/03/2019   Smoker     Assessment: Patient Reported Symptoms:  Cognitive Cognitive Status: Struggling with memory recall, Other: (patient groggy, speech slurred during visit today-per patient he   did not sleep well the night before)      Neurological Neurological Review of Symptoms: Not assessed    HEENT HEENT Symptoms Reported: No symptoms reported      Cardiovascular Cardiovascular Symptoms Reported: Not assessed    Respiratory Respiratory Symptoms Reported: Not assesed    Endocrine Patient reports the following symptoms related to hypoglycemia or hyperglycemia : No symptoms reported    Gastrointestinal Gastrointestinal Symptoms Reported: Not assessed      Genitourinary Genitourinary Symptoms Reported: No symptoms reported    Integumentary Integumentary Symptoms Reported: No symptoms reported    Musculoskeletal Musculoskelatal Symptoms Reviewed: No symptoms reported        Psychosocial Psychosocial Symptoms Reported: No symptoms reported Other Psychosocial Conditions: patient remains agreeable to refferal for personal care services to be coordinated through [employer]-declines mental health follow up             10/25/2023    1:46 PM  Depression screen PHQ 2/9  Decreased Interest 1  Down, Depressed, Hopeless 1  PHQ - 2 Score 2  Altered sleeping 1  Tired, decreased energy 1  Change in appetite 0  Feeling bad or failure about yourself  1  Trouble concentrating 1  Moving slowly or fidgety/restless 0  Suicidal thoughts 0  PHQ-9 Score 6  Difficult doing work/chores Somewhat difficult    There were no vitals filed for this visit.  Medications Reviewed Today     Reviewed by Ave Leisure, LCSW (Social Worker) on 11/01/23 at 1321  Med List Status: <None>   Medication Order Taking? Sig Documenting Provider Last Dose Status Informant  Accu-Chek Softclix Lancets lancets [id number] Yes Use to test blood sugar once daily Zorita Hiss, NP Taking Active   acetaminophen  (ACETAMINOPHEN  8 HOUR) 650 MG CR tablet [id number] Yes Take 1 tablet (650 mg total) by mouth every 8 (eight) hours as needed for pain  Patient taking differently: Take 1,300 mg by mouth every 8 (eight) hours as needed for pain or fever.    Taking Active Self, Pharmacy Records           Med Note Lajuana Pilar, Jacky Massing   Tue Sep 12, 2023  1:49 PM) Reports takes 1200 mg at 6am, 1200mg  at 12N, 1200mg  at 6pm and 1200mg  again.  amLODipine  (NORVASC ) 10 MG tablet [id number] Yes Take 1 tablet (10 mg total) by mouth daily. Zorita Hiss, NP Taking Active   ascorbic acid  (VITAMIN C ) 500 MG tablet [id number] Yes Take 1 tablet (500 mg total) by mouth  daily.  Taking Active Self, Pharmacy Records  atorvastatin  (LIPITOR) 10 MG tablet [id number] Yes Take 1 tablet (10 mg total) by mouth daily. Zorita Hiss, NP Taking Active   Blood Glucose Monitoring Suppl (ACCU-CHEK GUIDE) w/Device KIT [id number] Yes Use to test blood sugar once daily Zorita Hiss, NP Taking Active   cetirizine  (ZYRTEC ) 10 MG tablet [id number] Yes Take 1 tablet (10 mg total) by mouth daily. Zorita Hiss, NP Taking Active Self, Pharmacy Records  dapagliflozin  propanediol (FARXIGA ) 10 MG TABS  tablet [id number] Yes Take 1 tablet (10 mg total) by mouth daily before breakfast. Zorita Hiss, NP Taking Active Self, Pharmacy Records  folic acid  (FOLVITE ) 1 MG tablet [id number] Yes Take 1 tablet (1 mg total) by mouth daily. Zorita Hiss, NP Taking Active   gabapentin  (NEURONTIN ) 300 MG capsule [id number] Yes Take 1 capsule (300 mg total) by mouth 2 (two) times daily.  Patient taking differently: Take 600 mg by mouth at bedtime.   Jennefer Moats, DPM Taking Active Self, Pharmacy Records           Med Note Lajuana Pilar, JUANA M   Tue Sep 12, 2023  1:43 PM) Reports taking 300 mg in am and 600 mg at night.  glucose blood (ACCU-CHEK GUIDE TEST) test strip [id number] Yes Use to test blood sugar once daily Zorita Hiss, NP Taking Active   guaiFENesin  (MUCINEX ) 600 MG 12 hr tablet [id number] Yes Take 1 tablet (600 mg total) by mouth 2 (two) times daily as needed to loosen phlegm or cough. Zorita Hiss, NP Taking Active            Med Note Dimitri France, Malachai Schalk M   Thu Oct 12, 2023  1:18 PM) Need re-fill   hydrOXYzine  (VISTARIL ) 50 MG capsule [id number] Yes Take 1 capsule (50 mg total) by mouth at bedtime as needed. Zorita Hiss, NP Taking Active     Discontinued 03/15/23 1105 (Change in therapy)   ipratropium-albuterol  (DUONEB) 0.5-2.5 (3) MG/3ML SOLN [id number] Yes Take 3 mLs by nebulization every 6 (six) hours as needed. [provider] Taking Active Self, Pharmacy Records  isosorbide  mononitrate (IMDUR ) 30 MG 24 hr tablet [id number] Yes Take 1 tablet (30 mg total) by mouth daily. Cala Castleman, MD Taking Active   Lancets Misc. (ACCU-CHEK SOFTCLIX LANCET DEV) Suzanne Erps [id number] Yes Use to test blood sugar once daily Zorita Hiss, NP Taking Active   losartan  (COZAAR ) 25 MG tablet [id number] Yes Take 1 tablet (25 mg total) by mouth daily. Alwin Baars, DO Taking Active Self, Pharmacy Records  metFORMIN  (GLUCOPHAGE ) 500 MG tablet [id number] Yes Take 2 tablets (1,000 mg total) by mouth daily with  breakfast AND 1 tablet (500 mg total) daily with supper. Zorita Hiss, NP Taking Active Self, Pharmacy Records  metoprolol  succinate (TOPROL -XL) 25 MG 24 hr tablet [id number] Yes Take 1 tablet (25 mg total) by mouth daily. Alwin Baars, DO Taking Active Self, Pharmacy Records  nicotine  (NICODERM CQ  - DOSED IN MG/24 HOURS) 21 mg/24hr patch [id number] No Place 1 patch (21 mg total) onto the skin daily.  Patient not taking: Reported on 11/01/2023   Cala Castleman, MD Not Taking Active   thiamine  (VITAMIN B1) 100 MG tablet [id number] Yes Take 1 tablet (100 mg total) by mouth daily.  Taking Active Self, Pharmacy Records  Tiotropium Bromide -Olodaterol 2.5-2.5 MCG/ACT AERS [id number] Yes Inhale 2 puffs into the lungs daily. Antonio Baumgarten, NP Taking Active   torsemide  (DEMADEX ) 20 MG  tablet [id number] Yes Take 2 tablets (40 mg total) by mouth daily. Zorita Hiss, NP Taking Active   triamcinolone  cream (KENALOG ) 0.5 % [id number] Yes Apply topically to legs twice daily for up to 2 weeks as needed for eczema Zorita Hiss, NP Taking Active             Recommendation:   PCP Follow-up Pulmonary 11/02/23 Wound Care 11/07/23  Follow Up Plan:   Telephone follow-up 11/15/23  Michaelle Adolphus, LCSW [place]  [facility], [facility] Licensed Clinical Social Worker Care Coordinator  Direct Dial: [phone]

## 2023-11-02 ENCOUNTER — Ambulatory Visit (HOSPITAL_BASED_OUTPATIENT_CLINIC_OR_DEPARTMENT_OTHER)

## 2023-11-02 ENCOUNTER — Encounter (HOSPITAL_COMMUNITY)

## 2023-11-02 ENCOUNTER — Telehealth (HOSPITAL_COMMUNITY): Payer: Self-pay

## 2023-11-02 NOTE — Telephone Encounter (Signed)
 Patient c/o for 10:15 class stating his stoma hurts and has not been sleeping well. States he will be in for his 1st class on Tuesday.

## 2023-11-07 ENCOUNTER — Ambulatory Visit: Payer: Self-pay | Admitting: *Deleted

## 2023-11-07 ENCOUNTER — Ambulatory Visit (HOSPITAL_COMMUNITY): Admitting: Nurse Practitioner

## 2023-11-07 ENCOUNTER — Encounter (HOSPITAL_COMMUNITY): Admission: RE | Admit: 2023-11-07 | Source: Ambulatory Visit

## 2023-11-07 ENCOUNTER — Telehealth (HOSPITAL_COMMUNITY): Payer: Self-pay | Admitting: *Deleted

## 2023-11-07 NOTE — Telephone Encounter (Signed)
 [place] called to let pulmonary rehab staff know he was going to be out today - he was not feeling well. Support staff transferred call due to the symptoms he reported for further review.  Currie complains of feeling short of breath despite taking "extra" torsemide  for weight gain.  Asked what his weight has trended at home.  Indicated that he had not weighed himself but felt like it was up.  Unable to lay flat in the bed without feeling like he is suffocating. He also mentioned that he wasn't for sure if he had Pneumonia as he has had it three times in the past year and this also feels very similar to that.  Asked if he had a productive cough he stated he did asked if frothy in nature he denies.  Talked about being tired of feeling bad all the time. Concerned regarding his acute symptoms with his medical issues advised him to call 911.  Pt declined and indicated that it wasn't that bad although in the next sentence he talks about how he feels he is getting worse and worse.  Again reiterated that going into the ER for further assessment and testing will be helpful in determining the cause.  He stated if it gets that bad, I am sure I can get someone to take me to the hospital.  I have already cancelled my appt with the wound/ostomy clinic because I did not feel like coming. Reminded him of actionable symptoms with strong encouragement that if would not call 911 to at least notify his doctor.  Thanked me for the call.  Will alert pulmonary rehab staff. Lettie Ray, BSN Cardiac and Emergency planning/management officer

## 2023-11-07 NOTE — Telephone Encounter (Signed)
 Chief Complaint: SOB with chest tightness at times. Weight gain increasing Symptoms: SOB with exertion, chest tightness at times. Taking more torsemide  due to weight gain. Today weighing 243 lbs.  Last weight 238 lbs on 11/02/23. Difficulty changing dressings, requesting assist.   Frequency: couple of day  Pertinent Negatives: Patient denies chest pain with difficulty breathing no fever reported.  Disposition: [] ED /[] Urgent Care (no appt availability in office) / [] Appointment(In office/virtual)/ []  [facility]/ [] Home Care/ [x] Refused Recommended Disposition /[] [facility]/ []  Follow-up with PCP Additional Notes:   Recommended to be seen with in 4 hours or go to ED. Patient reports he does not have transportation. Pt reports if he gets bad enough he will call 911. Patient reports he has been increasing medication torsemide .  Patient requesting NT to come to house and assist him with changing his dressings and managing care. Please advise case manager to assist with [facility] if possible. Requesting office to call his cardiologist Dr. Marven Slimmer for additional assistance. Patient reports he has too many Dr and can not keep up with all of the #.  Patient declined to be seen at this time. Please advise.  CAL notified patient declined advise for now.     Copied from CRM [id number]. Topic: Clinical - Red Word Triage >> Nov 07, 2023 11:35 AM Albertha Alosa wrote: [facility] that prompted transfer to Nurse Triage: Patient called in stating been having some problems breathing for a couple days, chest is tight believes it may be pneumonia Reason for Disposition  [1] MILD difficulty breathing (e.g., minimal/no SOB at rest, SOB with walking, pulse <100) AND [2] NEW-onset or WORSE than normal  Answer Assessment - Initial Assessment Questions 1. RESPIRATORY STATUS: "Describe your breathing?" (e.g., wheezing, shortness of breath, unable to speak, severe coughing)      SOB and chest tightness with cough   2. ONSET: "When did this breathing problem begin?"      Couple of days  3. PATTERN "Does the difficult breathing come and go, or has it been constant since it started?"      Comes and goes  4. SEVERITY: "How bad is your breathing?" (e.g., mild, moderate, severe)    - MILD: No SOB at rest, mild SOB with walking, speaks normally in sentences, can lie down, no retractions, pulse < 100.    - MODERATE: SOB at rest, SOB with minimal exertion and prefers to sit, cannot lie down flat, speaks in phrases, mild retractions, audible wheezing, pulse 100-120.    - SEVERE: Very SOB at rest, speaks in single words, struggling to breathe, sitting hunched forward, retractions, pulse > 120      SOB at times worsening with chest tightness  5. RECURRENT SYMPTOM: "Have you had difficulty breathing before?" If Yes, ask: "When was the last time?" and "What happened that time?"      Yes  6. CARDIAC HISTORY: "Do you have any history of heart disease?" (e.g., heart attack, angina, bypass surgery, angioplasty)      See hx  7. LUNG HISTORY: "Do you have any history of lung disease?"  (e.g., pulmonary embolus, asthma, emphysema)     Na  8. CAUSE: "What do you think is causing the breathing problem?"      Weight gain 9. OTHER SYMPTOMS: "Do you have any other symptoms? (e.g., dizziness, runny nose, cough, chest pain, fever)     SOB with cough and chest tightness, difficulty caring for self. Weight gain 10. O2 SATURATION MONITOR:  "  Do you use an oxygen saturation monitor (pulse oximeter) at home?" If Yes, ask: "What is your reading (oxygen level) today?" "What is your usual oxygen saturation reading?" (e.g., 95%)       na 11. PREGNANCY: "Is there any chance you are pregnant?" "When was your last menstrual period?"       na 12. TRAVEL: "Have you traveled out of the country in the last month?" (e.g., travel history, exposures)       na  Protocols used: Breathing Difficulty-A-AH

## 2023-11-08 ENCOUNTER — Other Ambulatory Visit (HOSPITAL_COMMUNITY): Payer: Self-pay

## 2023-11-09 ENCOUNTER — Encounter (HOSPITAL_COMMUNITY): Admission: RE | Admit: 2023-11-09 | Source: Ambulatory Visit

## 2023-11-09 ENCOUNTER — Telehealth (HOSPITAL_COMMUNITY): Payer: Self-pay

## 2023-11-09 NOTE — Telephone Encounter (Signed)
 Patient left message calling out for 1:15 class stating they were feeling under the weather but should be back for their next class.

## 2023-11-10 ENCOUNTER — Ambulatory Visit (HOSPITAL_COMMUNITY): Admitting: Nurse Practitioner

## 2023-11-13 ENCOUNTER — Other Ambulatory Visit (HOSPITAL_COMMUNITY): Payer: Self-pay

## 2023-11-14 ENCOUNTER — Encounter (HOSPITAL_COMMUNITY): Admission: RE | Admit: 2023-11-14 | Source: Ambulatory Visit

## 2023-11-14 ENCOUNTER — Telehealth (HOSPITAL_COMMUNITY): Payer: Self-pay

## 2023-11-14 NOTE — Telephone Encounter (Signed)
-----   Message from Branch C sent at 11/14/2023  9:37 AM EDT ----- Regarding: Pt called out Hey,  Pt called and stated he will not able to attend PR today due to not feeling well. Please follow up with pt.  Thanks, Camilo Cella

## 2023-11-15 ENCOUNTER — Other Ambulatory Visit: Payer: Self-pay | Admitting: *Deleted

## 2023-11-16 ENCOUNTER — Encounter (HOSPITAL_COMMUNITY)
Admission: RE | Admit: 2023-11-16 | Discharge: 2023-11-16 | Disposition: A | Discharge status: Home / Self Care | Source: Ambulatory Visit | Attending: Pulmonary Disease | Admitting: Pulmonary Disease

## 2023-11-16 VITALS — Wt 241.4 lb

## 2023-11-16 DIAGNOSIS — J449 Chronic obstructive pulmonary disease, unspecified: Secondary | ICD-10-CM | POA: Insufficient documentation

## 2023-11-16 DIAGNOSIS — Z7689 Persons encountering health services in other specified circumstances: Secondary | ICD-10-CM | POA: Diagnosis not present

## 2023-11-16 NOTE — Progress Notes (Signed)
 Daily Session Note  Patient Details  Name: Luis [name] MRN: [id number] Date of Birth: 12/29/1960 Referring Provider:   Gattis Kass Pulmonary Rehab Walk Test from 10/25/2023 in [facility]  Referring Provider Dr. Oma Bias       Encounter Date: 11/16/2023  Check In:  Session Check In - 11/16/23 1426       Check-In   Supervising physician immediately available to respond to emergencies [facility] MD immediately available    Physician(s) Palmer Bobo, NP    Location [facility]    Staff Present Atlas Lea, MS, ACSM-CEP, Exercise Physiologist;Randi Rochelle Chu, ACSM-CEP, Exercise Physiologist;David Memphis, MS, ACSM-CEP, CCRP, Exercise Physiologist;Palmer Shorey Felipe Horton, RT    Virtual Visit No    Medication changes reported     No    Fall or balance concerns reported    No    Tobacco Cessation Use Decreased    Current number of cigarettes/nicotine  per day     0.5    Warm-up and Cool-down Performed as group-led instruction    Resistance Training Performed Yes    VAD Patient? No    PAD/SET Patient? No      Pain Assessment   Currently in Pain? No/denies    Pain Score 0-No pain    Pain Onset More than a month ago    Multiple Pain Sites No             Capillary Blood Glucose: No results found for this or any previous visit (from the past 24 hours).    Social History   Tobacco Use  Smoking Status Every Day   Current packs/day: 1.00   Average packs/day: 1 pack/day for 44.0 years (44.0 ttl pk-yrs)   Types: Cigarettes  Smokeless Tobacco Current  Tobacco Comments   Less than .5pk daily 10/03/23    Goals Met:  Proper associated with RPD/PD & O2 Sat Independence with exercise equipment Exercise tolerated well No report of concerns or symptoms today Strength training completed today  Goals Unmet:  Not Applicable  Comments: Service time is from 1306 to 1432.    Dr. Genetta Kenning is Medical  Director for Pulmonary Rehab at [facility].

## 2023-11-17 LAB — GLUCOSE, CAPILLARY: Glucose-Capillary: 171 mg/dL — ABNORMAL HIGH (ref 70–99)

## 2023-11-17 NOTE — Patient Instructions (Signed)
 Visit Information  [name] was given information about Medicaid Managed Care team care coordination services as a part of their [facility] Medicaid benefit. Luis [name] verbally consented to engagement with the [facility] Managed Care team.   If you are experiencing a medical emergency, please call 911 or report to your local emergency department or urgent care.   If you have a non-emergency medical problem during routine business hours, please contact your provider's office and ask to speak with a nurse.   For questions related to your [facility] health plan, please call: [phone] or go here:[email or web address]  If you would like to schedule transportation through your [facility] plan, please call the following number at least 2 days in advance of your appointment: [phone].   You can also use the MTM portal or MTM mobile app to manage your rides. Reimbursement for transportation is available through [facility] For the portal, please go to [email or web address]  Call the [facility] Crisis Line at [phone], at any time, 24 hours a day, 7 days a week. If you are in danger or need immediate medical attention call 911.  If you would like help to quit smoking, call 1-800-QUIT-NOW (434-138-5739) OR Espaol: 1-855-Djelo-Ya (2-725-366-4403) o para ms informacin haga clic aqu or Text READY to 474-259 to register via text  Luis [name] - following are the goals we discussed in your visit today:   Goals Addressed             This Visit's Progress    VBCI Social Work Care Plan LCSW       Problems:   Level of Care Concerns:Inability to perform ADL's independently  CSW Clinical Goal(s):   Over the next 90 days the Patient will work with CSW to address needs related to in home care support.  Interventions:  Level of Care Concerns in a patient with congestive heart failure Current level of care: home, alone Evaluation of patient's unmet needs in  current living environment ADL's Assessed needs, level of care concerns, how currently meeting needs and barriers to care Confirmed that patient is agreeable to beginning process for applying personal care services CSW to collaborate with primary care provider regarding completion of request forms for personal care services, however referral will need to be generated through patient's managed medicaid plan Patient's managed medicaid plan contacted to complete referral for personal care services([employer])-referral referred received -will complete and return Continue to manage expectations related to personal care services and role of in home aid Patient does not have an Advanced Directive- AD packet mailed to patient's home for review-review of document remains pending  Patient Goals/Self-Care Activities:  Continue taking your medication as prescribed.   Patient to expect a call from [facility] to discuss in home assessment for personal care services once referral forms are received  Plan:   Telephone follow up appointment with care management team member scheduled for:  12/01/23         Patient verbalizes understanding of instructions and care plan provided today and agrees to view in MyChart. Active MyChart status and patient understanding of how to access instructions and care plan via MyChart confirmed with patient.     Licensed Clinical Social Worker will follow up with patient regarding status of personal care service referral on 12/01/23  [place], LCSW [place]  [facility], [facility] Licensed Clinical Social Worker Care Coordinator  Direct Dial: [phone]     Following is a copy of your plan of care:  There are  no care plans that you recently modified to display for this patient.

## 2023-11-17 NOTE — Progress Notes (Signed)
 Pt returns today for exercise in pulmonary rehab.  Pt remarks that he was having some abdominal pain issues.  Assessed pt ostomy area which is well secured and intake.  Unable to visualize the stoma area due to fecal matter.  Pt states that he changed his colostomy last night.  Pt with bulky dressing to his abdominal area this was also changed yesterday evening and remains intake.   Abdominal area red with flaky skin in a circular fashion.  Pt has not seen the wound nurse lately as he has felt so bad he couldn't make it in and cancelled the appts. Offered to place ace bandage on his wound and ostomy for security.  Zakkery declines and feels he has everything in place and there should not be any leakage during exercise.  Pt tolerated light exercise with no new complaints.  Will continue to reassess and monitor each exercise session. Adora Aland RN, BSN Cardiac and Pulmonary Rehab Nurse Navigator

## 2023-11-17 NOTE — Patient Outreach (Signed)
 Complex Care Management   Visit Note  11/17/2023-late entry  Name:  Luis [name] MRN: [id number] DOB: 01-14-1961  Situation: Referral received for Complex Care Management related to SDOH Barriers:  personal care services needs I obtained verbal consent from Patient.  Visit completed with patient  on the phone on 11/15/23  Background:   Past Medical History:  Diagnosis Date   Acute respiratory failure with hypoxia (HCC) 12/23/2022   Alcohol withdrawal syndrome, with delirium (HCC) 12/27/2022   COPD (chronic obstructive pulmonary disease) (HCC)    Diabetes mellitus without complication (HCC)    Diverticulitis    DVT (deep venous thrombosis) (HCC)    x3   Encephalopathy acute 12/28/2022   ETOH abuse    H/O colectomy    Hyperlipidemia    Hypertension    Sepsis associated hypotension (HCC) 08/03/2019   Smoker     Assessment: Patient Reported Symptoms:  Cognitive Cognitive Status: Alert and oriented to person, place, and time      Neurological Neurological Review of Symptoms: Dizziness ("sometimes") Neurological Management Strategies: Medication therapy, Routine screening  HEENT HEENT Symptoms Reported: No symptoms reported      Cardiovascular Cardiovascular Symptoms Reported: No symptoms reported Does patient have uncontrolled Hypertension?: No Cardiovascular Conditions: Hypertension, Heart failure Cardiovascular Management Strategies: Medication therapy, Routine screening, Weight management, Exercise  Respiratory Respiratory Symptoms Reported: Shortness of breath Other Respiratory Symptoms: ealrier this morning, may be from laying on the couch, sleeping habits are off 2-3 hour nap in the mornig Respiratory Conditions: COPD Respiratory Comment: pulmonary rehab starts tomorrow  Endocrine Patient reports the following symptoms related to hypoglycemia or hyperglycemia : No symptoms reported Is patient diabetic?: Yes Is patient checking blood sugars at home?: Yes (not in a last  week) Endocrine Conditions: Diabetes Endocrine Management Strategies: Medication therapy, Routine screening  Gastrointestinal Gastrointestinal Symptoms Reported: Abdominal pain or discomfort Additional Gastrointestinal Details: patient has a ileostomy Gastrointestinal Conditions: Abdominal pain Nutrition Risk Screen (CP): No indicators present  Genitourinary Genitourinary Symptoms Reported: No symptoms reported    Integumentary Integumentary Symptoms Reported: Not assessed    Musculoskeletal Musculoskelatal Symptoms Reviewed: Not assessed        Psychosocial Other Psychosocial Conditions: patient remains agreeable to referral for personal care services-currently pending through [facility] continues to decline mental health follow up Behavioral Health Conditions: Depression Behavioral Management Strategies: Adequate rest, Coping strategies Major Change/Loss/Stressor/Fears (CP): Medical condition, self Behaviors When Feeling Stressed/Fearful: music, cooking, gardening Techniques to [employer] with Loss/Stress/Change: Diversional activities Quality of Family Relationships: supportive Do you feel physically threatened by others?: No      10/25/2023    1:46 PM  Depression screen PHQ 2/9  Decreased Interest 1  Down, Depressed, Hopeless 1  PHQ - 2 Score 2  Altered sleeping 1  Tired, decreased energy 1  Change in appetite 0  Feeling bad or failure about yourself  1  Trouble concentrating 1  Moving slowly or fidgety/restless 0  Suicidal thoughts 0  PHQ-9 Score 6  Difficult doing work/chores Somewhat difficult    There were no vitals filed for this visit.  Medications Reviewed Today     Reviewed by Ave Le[place], LCSW (Social Worker) on 11/15/23 at 1227  Med List Status: <None>   Medication Order Taking? Sig Documenting Provider Last Dose Status Informant  Accu-Chek Softclix Lancets lancets [id number] Yes Use to test blood sugar once daily Zorita Hiss, NP Taking Active    acetaminophen  (ACETAMINOPHEN  8 HOUR) 650 MG CR tablet [id number] Yes Take 1 tablet (650  mg total) by mouth every 8 (eight) hours as needed for pain  Patient taking differently: Take 1,300 mg by mouth every 8 (eight) hours as needed for pain or fever.    Taking Active Self, Pharmacy Records           Med Note Lajuana Pilar, Jacky Massing   Tue Sep 12, 2023  1:49 PM) Reports takes 1200 mg at 6am, 1200mg  at 12N, 1200mg  at 6pm and 1200mg  again.  amLODipine  (NORVASC ) 10 MG tablet [id number] Yes Take 1 tablet (10 mg total) by mouth daily. Zorita Hiss, NP Taking Active   ascorbic acid  (VITAMIN C ) 500 MG tablet [id number] Yes Take 1 tablet (500 mg total) by mouth daily.  Taking Active Self, Pharmacy Records  atorvastatin  (LIPITOR) 10 MG tablet [id number] Yes Take 1 tablet (10 mg total) by mouth daily. Zorita Hiss, NP Taking Active   Blood Glucose Monitoring Suppl (ACCU-CHEK GUIDE) w/Device KIT [id number] Yes Use to test blood sugar once daily Zorita Hiss, NP Taking Active   cetirizine  (ZYRTEC ) 10 MG tablet [id number] Yes Take 1 tablet (10 mg total) by mouth daily. Zorita Hiss, NP Taking Active Self, Pharmacy Records  dapagliflozin  propanediol (FARXIGA ) 10 MG TABS tablet [id number] Yes Take 1 tablet (10 mg total) by mouth daily before breakfast. Zorita Hiss, NP Taking Active Self, Pharmacy Records  folic acid  (FOLVITE ) 1 MG tablet [id number] Yes Take 1 tablet (1 mg total) by mouth daily. Zorita Hiss, NP Taking Active   gabapentin  (NEURONTIN ) 300 MG capsule [id number] Yes Take 1 capsule (300 mg total) by mouth 2 (two) times daily.  Patient taking differently: Take 600 mg by mouth at bedtime.   Jennefer Moats, DPM Taking Active Self, Pharmacy Records           Med Note Lajuana Pilar, JUANA M   Tue Sep 12, 2023  1:43 PM) Reports taking 300 mg in am and 600 mg at night.  glucose blood (ACCU-CHEK GUIDE TEST) test strip [id number] Yes Use to test blood sugar once daily Zorita Hiss, NP Taking Active   guaiFENesin  (MUCINEX )  600 MG 12 hr tablet [id number] Yes Take 1 tablet (600 mg total) by mouth 2 (two) times daily as needed to loosen phlegm or cough. Zorita Hiss, NP Taking Active            Med Note Dimitri France, Ramirez Fullbright M   Thu Oct 12, 2023  1:18 PM) Need re-fill   hydrOXYzine  (VISTARIL ) 50 MG capsule [id number] Yes Take 1 capsule (50 mg total) by mouth at bedtime as needed. Zorita Hiss, NP Taking Active     Discontinued 03/15/23 1105 (Change in therapy)   ipratropium-albuterol  (DUONEB) 0.5-2.5 (3) MG/3ML SOLN [id number] Yes Take 3 mLs by nebulization every 6 (six) hours as needed. [provider] Taking Active Self, Pharmacy Records  isosorbide  mononitrate (IMDUR ) 30 MG 24 hr tablet [id number] Yes Take 1 tablet (30 mg total) by mouth daily. Cala Castleman, MD Taking Active   Lancets Misc. (ACCU-CHEK SOFTCLIX LANCET DEV) Suzanne Erps [id number] Yes Use to test blood sugar once daily Zorita Hiss, NP Taking Active   losartan  (COZAAR ) 25 MG tablet [id number] Yes Take 1 tablet (25 mg total) by mouth daily. Alwin Baars, DO Taking Active Self, Pharmacy Records  metFORMIN  (GLUCOPHAGE ) 500 MG tablet [id number] Yes Take 2 tablets (1,000 mg total) by mouth daily with breakfast AND 1 tablet (500 mg total) daily with supper. Zorita Hiss, NP Taking Active Self, Pharmacy Records  metoprolol  succinate (TOPROL -XL) 25 MG 24 hr tablet [id number] Yes Take 1 tablet (25 mg total) by mouth daily. Alwin Baars, DO Taking Active Self, Pharmacy Records  nicotine  (NICODERM CQ  - DOSED IN MG/24 HOURS) 21 mg/24hr patch [id number] Yes Place 1 patch (21 mg total) onto the skin daily. Singh, Prashant K, MD Taking Active   thiamine  (VITAMIN B1) 100 MG tablet [id number] Yes Take 1 tablet (100 mg total) by mouth daily.  Taking Active Self, Pharmacy Records  Tiotropium Bromide -Olodaterol 2.5-2.5 MCG/ACT AERS [id number] Yes Inhale 2 puffs into the lungs daily. Antonio Baumgarten, NP Taking Active   torsemide  (DEMADEX ) 20 MG tablet [id number] Yes Take 2  tablets (40 mg total) by mouth daily. Zorita Hiss, NP Taking Active   triamcinolone  cream (KENALOG ) 0.5 % [id number] Yes Apply topically to legs twice daily for up to 2 weeks as needed for eczema Zorita Hiss, NP Taking Active             Recommendation:   PCP Follow-up Specialty provider follow-up as scheduled Referral for personal care services pending. CSW to continue to follow status of referral  Follow Up Plan:   Telephone follow up appointment date/time:  12/01/23   Michaelle Adolphus, LCSW [place]  [facility], [facility] Licensed Clinical Social Worker Care Coordinator  Direct Dial: [phone]

## 2023-11-18 ENCOUNTER — Other Ambulatory Visit (HOSPITAL_COMMUNITY): Payer: Self-pay

## 2023-11-20 ENCOUNTER — Ambulatory Visit: Admitting: *Deleted

## 2023-11-20 ENCOUNTER — Other Ambulatory Visit (HOSPITAL_COMMUNITY): Payer: Self-pay

## 2023-11-20 DIAGNOSIS — T8189XA Other complications of procedures, not elsewhere classified, initial encounter: Secondary | ICD-10-CM | POA: Diagnosis not present

## 2023-11-20 DIAGNOSIS — F17210 Nicotine dependence, cigarettes, uncomplicated: Secondary | ICD-10-CM | POA: Diagnosis not present

## 2023-11-20 DIAGNOSIS — K469 Unspecified abdominal hernia without obstruction or gangrene: Secondary | ICD-10-CM | POA: Diagnosis not present

## 2023-11-20 NOTE — Progress Notes (Signed)
  Virtual Visit via Telephone Note  I connected with Luis [name] on 11/20/23 at 10:30 AM EDT by telephone and verified that I am speaking with the correct person using two identifiers.  Location: Patient: Luis [name] Provider: Alyse Bach, RN   I discussed the limitations, risks, security and privacy concerns of performing an evaluation and management service by telephone and the availability of in person appointments. I also discussed with the patient that there may be a patient responsible charge related to this service. The patient expressed understanding and agreed to proceed.   Shared Decision Making Visit Lung Cancer Screening Program [phone])   Eligibility: Age 63 y.o. Pack Years Smoking History Calculation 36 (# packs/per year x # years smoked) Recent History of coughing up blood  no Unexplained weight loss? no ( >Than 15 pounds within the last 6 months ) Prior History Lung / other cancer no (Diagnosis within the last 5 years already requiring surveillance chest CT Scans). Smoking Status Current Smoker Former Smokers: Years since quit: n/a  Quit Date: n/a  Visit Components: Discussion included one or more decision making aids. yes Discussion included risk/benefits of screening. yes Discussion included potential follow up diagnostic testing for abnormal scans. yes Discussion included meaning and risk of over diagnosis. yes Discussion included meaning and risk of False Positives. yes Discussion included meaning of total radiation exposure. yes  Counseling Included: Importance of adherence to annual lung cancer LDCT screening. yes Impact of comorbidities on ability to participate in the program. yes Ability and willingness to under diagnostic treatment. yes  Smoking Cessation Counseling: Current Smokers:  Discussed importance of smoking cessation. yes Information about tobacco cessation classes and interventions provided to patient. yes Patient provided with  "ticket" for LDCT Scan. no Symptomatic Patient. no  Counseling(Intermediate counseling: > three minutes) 99406 Diagnosis Code: Tobacco Use Z72.0 Asymptomatic Patient yes  Counseling (Intermediate counseling: > three minutes counseling) [id number] Former Smokers:  Discussed the importance of maintaining cigarette abstinence. yes Diagnosis Code: Personal History of Nicotine  Dependence. W29.562 Information about tobacco cessation classes and interventions provided to patient. Yes Patient provided with "ticket" for LDCT Scan. no Written Order for Lung Cancer Screening with LDCT placed in Epic. Yes (CT Chest Lung Cancer Screening Low Dose W/O CM) [id number] Z12.2-Screening of respiratory organs Z87.891-Personal history of nicotine  dependence   Alyse Bach, RN

## 2023-11-20 NOTE — Patient Instructions (Signed)

## 2023-11-21 ENCOUNTER — Other Ambulatory Visit: Payer: Self-pay

## 2023-11-21 ENCOUNTER — Encounter (HOSPITAL_COMMUNITY)
Admission: RE | Admit: 2023-11-21 | Discharge: 2023-11-21 | Disposition: A | Discharge status: Home / Self Care | Source: Ambulatory Visit | Attending: Pulmonary Disease | Admitting: Pulmonary Disease

## 2023-11-21 ENCOUNTER — Telehealth (HOSPITAL_COMMUNITY): Payer: Self-pay

## 2023-11-21 DIAGNOSIS — J449 Chronic obstructive pulmonary disease, unspecified: Secondary | ICD-10-CM

## 2023-11-21 NOTE — Addendum Note (Signed)
 Addended by: Edra Govern D on: 11/21/2023 12:06 PM   Modules accepted: Orders

## 2023-11-21 NOTE — Progress Notes (Signed)
 Remote pacemaker transmission.

## 2023-11-21 NOTE — Telephone Encounter (Signed)
 Patient c/o for PR class today due to having issues with his ostomy bag, states he can't get it to stick on him. States he will be in class Thursday.

## 2023-11-22 ENCOUNTER — Ambulatory Visit (HOSPITAL_BASED_OUTPATIENT_CLINIC_OR_DEPARTMENT_OTHER)

## 2023-11-22 NOTE — Progress Notes (Signed)
 Pulmonary Individual Treatment Plan  Patient Details  Name: Luis [name] MRN: [id number] Date of Birth: [birth year] Referring Provider:   Gattis Kass Pulmonary Rehab Walk Test from 10/25/2023 in [facility]  Referring Provider Dr. Oma Bias       Initial Encounter Date:  Flowsheet Row Pulmonary Rehab Walk Test from 10/25/2023 in [facility]  Date 10/25/23       Visit Diagnosis: Stage 2 moderate COPD by GOLD classification (HCC)  Patient's Home Medications on Admission:   Current Outpatient Medications:    Accu-Chek Softclix Lancets lancets, Use to test blood sugar once daily, Disp: 100 each, Rfl: 11   acetaminophen  (ACETAMINOPHEN  8 HOUR) 650 MG CR tablet, Take 1 tablet (650 mg total) by mouth every 8 (eight) hours as needed for pain (Patient taking differently: Take 1,300 mg by mouth every 8 (eight) hours as needed for pain or fever.), Disp: 90 tablet, Rfl: 1   amLODipine  (NORVASC ) 10 MG tablet, Take 1 tablet (10 mg total) by mouth daily., Disp: 90 tablet, Rfl: 0   ascorbic acid  (VITAMIN C ) 500 MG tablet, Take 1 tablet (500 mg total) by mouth daily., Disp: 30 tablet, Rfl: 1   atorvastatin  (LIPITOR) 10 MG tablet, Take 1 tablet (10 mg total) by mouth daily., Disp: 90 tablet, Rfl: 1   Blood Glucose Monitoring Suppl (ACCU-CHEK GUIDE) w/Device KIT, Use to test blood sugar once daily, Disp: 1 kit, Rfl: 0   cetirizine  (ZYRTEC ) 10 MG tablet, Take 1 tablet (10 mg total) by mouth daily., Disp: 90 tablet, Rfl: 1   dapagliflozin  propanediol (FARXIGA ) 10 MG TABS tablet, Take 1 tablet (10 mg total) by mouth daily before breakfast., Disp: 90 tablet, Rfl: 2   folic acid  (FOLVITE ) 1 MG tablet, Take 1 tablet (1 mg total) by mouth daily., Disp: 90 tablet, Rfl: 0   gabapentin  (NEURONTIN ) 300 MG capsule, Take 1 capsule (300 mg total) by mouth 2 (two) times daily. (Patient taking  differently: Take 600 mg by mouth at bedtime.), Disp: 90 capsule, Rfl: 3   glucose blood (ACCU-CHEK GUIDE TEST) test strip, Use to test blood sugar once daily, Disp: 100 each, Rfl: 11   guaiFENesin  (MUCINEX ) 600 MG 12 hr tablet, Take 1 tablet (600 mg total) by mouth 2 (two) times daily as needed to loosen phlegm or cough., Disp: 180 tablet, Rfl: 1   hydrOXYzine  (VISTARIL ) 50 MG capsule, Take 1 capsule (50 mg total) by mouth at bedtime as needed., Disp: 30 capsule, Rfl: 2   ipratropium-albuterol  (DUONEB) 0.5-2.5 (3) MG/3ML SOLN, Take 3 mLs by nebulization every 6 (six) hours as needed., Disp: , Rfl:    isosorbide  mononitrate (IMDUR ) 30 MG 24 hr tablet, Take 1 tablet (30 mg total) by mouth daily., Disp: 30 tablet, Rfl: 0   Lancets Misc. (ACCU-CHEK SOFTCLIX LANCET DEV) KIT, Use to test blood sugar once daily, Disp: 1 kit, Rfl: 0   losartan  (COZAAR ) 25 MG tablet, Take 1 tablet (25 mg total) by mouth daily., Disp: 30 tablet, Rfl: 5   metFORMIN  (GLUCOPHAGE ) 500 MG tablet, Take 2 tablets (1,000 mg total) by mouth daily with breakfast AND 1 tablet (500 mg total) daily with supper., Disp: 90 tablet, Rfl: 3   metoprolol  succinate (TOPROL -XL) 25 MG 24 hr tablet, Take 1 tablet (25 mg total) by mouth daily., Disp: 90 tablet, Rfl: 3   nicotine  (NICODERM CQ  - DOSED IN MG/24 HOURS) 21 mg/24hr  patch, Place 1 patch (21 mg total) onto the skin daily., Disp: 28 patch, Rfl: 0   thiamine  (VITAMIN B1) 100 MG tablet, Take 1 tablet (100 mg total) by mouth daily., Disp: 90 tablet, Rfl: 0   Tiotropium Bromide -Olodaterol 2.5-2.5 MCG/ACT AERS, Inhale 2 puffs into the lungs daily., Disp: 4 g, Rfl: 3   torsemide  (DEMADEX ) 20 MG tablet, Take 2 tablets (40 mg total) by mouth daily., Disp: , Rfl:    triamcinolone  cream (KENALOG ) 0.5 %, Apply topically to legs twice daily for up to 2 weeks as needed for eczema, Disp: 30 g, Rfl: 1  Past Medical History: Past Medical History:  Diagnosis Date   Acute respiratory failure with hypoxia  (HCC) 12/23/2022   Alcohol withdrawal syndrome, with delirium (HCC) 12/27/2022   COPD (chronic obstructive pulmonary disease) (HCC)    Diabetes mellitus without complication (HCC)    Diverticulitis    DVT (deep venous thrombosis) (HCC)    x3   Encephalopathy acute 12/28/2022   ETOH abuse    H/O colectomy    Hyperlipidemia    Hypertension    Sepsis associated hypotension (HCC) 08/03/2019   Smoker     Tobacco Use: Social History   Tobacco Use  Smoking Status Every Day   Current packs/day: 0.75   Average packs/day: 0.8 packs/day for 48.4 years (36.3 ttl pk-yrs)   Types: Cigarettes   Start date: 1977  Smokeless Tobacco Current  Tobacco Comments   Less than .5pk daily 10/03/23    Labs: Review Flowsheet  More data exists      Latest Ref Rng & Units 12/25/2022 12/26/2022 02/16/2023 02/17/2023 08/06/2023  Labs for ITP Cardiac and Pulmonary Rehab  Trlycerides <150 mg/dL - 161  - - -  Hemoglobin A1c 4.8 - 5.6 % - - 7.0  - 7.3   PH, Arterial 7.35 - 7.45 7.376  7.197  7.28  - 7.44  - -  PCO2 arterial 32 - 48 mmHg 41.6  72.0  56  - 46  - -  Bicarbonate 20.0 - 28.0 mmol/L 24.4  28.1  26.3  - 31.9  30.8  -  TCO2 22 - 32 mmol/L 26  30  - - - -  Acid-base deficit 0.0 - 2.0 mmol/L 1.0  2.0  1.4  - - - -  O2 Saturation % 99  93  93.2  - 66.3  82.2  -    Details       Multiple values from one day are sorted in reverse-chronological order         Capillary Blood Glucose: Lab Results  Component Value Date   GLUCAP 171 (H) 11/16/2023   GLUCAP 134 (H) 08/14/2023   GLUCAP 216 (H) 08/14/2023   GLUCAP 199 (H) 08/13/2023   GLUCAP 310 (H) 08/13/2023    POCT Glucose     Row Name 10/25/23 1330             POCT Blood Glucose   Pre-Exercise 208 mg/dL                Pulmonary Assessment Scores:  Pulmonary Assessment Scores     Row Name 10/25/23 1341         ADL UCSD   ADL Phase Entry     SOB Score total 39       CAT Score   CAT Score 16       mMRC Score   mMRC  Score 4  UCSD: Self-administered rating of dyspnea associated with activities of daily living (ADLs) 6-point scale (0 = not at all to 5 = maximal or unable to do because of breathlessness)  Scoring Scores range from 0 to 120.  Minimally important difference is 5 units  CAT: CAT can identify the health impairment of COPD patients and is better correlated with disease progression.  CAT has a scoring range of zero to 40. The CAT score is classified into four groups of low (less than 10), medium (10 - 20), high (21-30) and very high (31-40) based on the impact level of disease on health status. A CAT score over 10 suggests significant symptoms.  A worsening CAT score could be explained by an exacerbation, poor medication adherence, poor inhaler technique, or progression of COPD or comorbid conditions.  CAT MCID is 2 points  mMRC: mMRC (Modified Medical Research Council) Dyspnea Scale is used to assess the degree of baseline functional disability in patients of respiratory disease due to dyspnea. No minimal important difference is established. A decrease in score of 1 point or greater is considered a positive change.   Pulmonary Function Assessment:  Pulmonary Function Assessment - 10/25/23 1341       Breath   Bilateral Breath Sounds Decreased    Shortness of Breath Yes;Limiting activity;Fear of Shortness of Breath             Exercise Target Goals: Exercise Program Goal: Individual exercise prescription set using results from initial 6 min walk test and THRR while considering  patient's activity barriers and safety.   Exercise Prescription Goal: Initial exercise prescription builds to 30-45 minutes a day of aerobic activity, 2-3 days per week.  Home exercise guidelines will be given to patient during program as part of exercise prescription that the participant will acknowledge.  Activity Barriers & Risk Stratification:  Activity Barriers & Cardiac Risk  Stratification - 10/25/23 1338       Activity Barriers & Cardiac Risk Stratification   Activity Barriers Deconditioning;Muscular Weakness;Shortness of Breath;Back Problems;Assistive Device             6 Minute Walk:  6 Minute Walk     Row Name 10/25/23 1510         6 Minute Walk   Phase Initial     Distance 653 feet     Walk Time 6 minutes     # of Rest Breaks 1  [id number]     MPH 1.24     METS 1.84     RPE 13     Perceived Dyspnea  0     VO2 Peak 6.44     Symptoms Yes (comment)     Comments bilateral hip pain     Resting HR 60 bpm     Resting BP 104/60     Resting Oxygen Saturation  94 %     Exercise Oxygen Saturation  during 6 min walk 90 %     Max Ex. HR 84 bpm     Max Ex. BP 142/68     2 Minute Post BP 130/66       Interval HR   1 Minute HR 63     2 Minute HR 67     3 Minute HR 78     4 Minute HR 77     5 Minute HR 80     6 Minute HR 84     2 Minute Post HR 67     Interval Heart Rate?  Yes       Interval Oxygen   Interval Oxygen? Yes     Baseline Oxygen Saturation % 94 %     1 Minute Oxygen Saturation % 92 %     1 Minute Liters of Oxygen 0 L     2 Minute Oxygen Saturation % 91 %     2 Minute Liters of Oxygen 0 L     3 Minute Oxygen Saturation % 90 %     3 Minute Liters of Oxygen 0 L     4 Minute Oxygen Saturation % 90 %     4 Minute Liters of Oxygen 0 L     5 Minute Oxygen Saturation % 91 %     5 Minute Liters of Oxygen 0 L     6 Minute Oxygen Saturation % 90 %     6 Minute Liters of Oxygen 0 L     2 Minute Post Oxygen Saturation % 94 %     2 Minute Post Liters of Oxygen 0 L              Oxygen Initial Assessment:  Oxygen Initial Assessment - 10/25/23 1340       Home Oxygen   Home Oxygen Device None    Sleep Oxygen Prescription None    Home Exercise Oxygen Prescription None    Home Resting Oxygen Prescription None      Initial 6 min Walk   Oxygen Used None      Program Oxygen Prescription   Program Oxygen Prescription None       Intervention   Short Term Goals To learn and understand importance of maintaining oxygen saturations>88%;To learn and understand importance of monitoring SPO2 with pulse oximeter and demonstrate accurate use of the pulse oximeter.;To learn and demonstrate proper pursed lip breathing techniques or other breathing techniques.     Long  Term Goals Exhibits proper breathing techniques, such as pursed lip breathing or other method taught during program session;Verbalizes importance of monitoring SPO2 with pulse oximeter and return demonstration;Compliance with respiratory medication;Maintenance of O2 saturations>88%             Oxygen Re-Evaluation:  Oxygen Re-Evaluation     Row Name 11/20/23 0731             Program Oxygen Prescription   Program Oxygen Prescription None         Home Oxygen   Home Oxygen Device None       Sleep Oxygen Prescription None       Home Exercise Oxygen Prescription None       Home Resting Oxygen Prescription None         Goals/Expected Outcomes   Short Term Goals To learn and understand importance of maintaining oxygen saturations>88%;To learn and understand importance of monitoring SPO2 with pulse oximeter and demonstrate accurate use of the pulse oximeter.;To learn and demonstrate proper pursed lip breathing techniques or other breathing techniques.        Long  Term Goals Exhibits proper breathing techniques, such as pursed lip breathing or other method taught during program session;Verbalizes importance of monitoring SPO2 with pulse oximeter and return demonstration;Compliance with respiratory medication;Maintenance of O2 saturations>88%       Goals/Expected Outcomes Compliance and understanding of oxygen saturation monitoring and breathing techniques to decrease shortness of breath.                Oxygen Discharge (Final Oxygen Re-Evaluation):  Oxygen Re-Evaluation - 11/20/23 1610  Program Oxygen Prescription   Program Oxygen Prescription  None      Home Oxygen   Home Oxygen Device None    Sleep Oxygen Prescription None    Home Exercise Oxygen Prescription None    Home Resting Oxygen Prescription None      Goals/Expected Outcomes   Short Term Goals To learn and understand importance of maintaining oxygen saturations>88%;To learn and understand importance of monitoring SPO2 with pulse oximeter and demonstrate accurate use of the pulse oximeter.;To learn and demonstrate proper pursed lip breathing techniques or other breathing techniques.     Long  Term Goals Exhibits proper breathing techniques, such as pursed lip breathing or other method taught during program session;Verbalizes importance of monitoring SPO2 with pulse oximeter and return demonstration;Compliance with respiratory medication;Maintenance of O2 saturations>88%    Goals/Expected Outcomes Compliance and understanding of oxygen saturation monitoring and breathing techniques to decrease shortness of breath.             Initial Exercise Prescription:  Initial Exercise Prescription - 10/25/23 1500       Date of Initial Exercise RX and Referring Provider   Date 10/25/23    Referring Provider Dr. Oma Bias    Expected Discharge Date 01/23/24      Arm Ergometer   Level 1    Minutes 15    METs 1.5      Track   Laps 4    Minutes 15    METs 1.62      Prescription Details   Frequency (times per week) 2    Duration Progress to 30 minutes of continuous aerobic without signs/symptoms of physical distress      Intensity   THRR 40-80% of Max Heartrate 62-125    Ratings of Perceived Exertion 11-13    Perceived Dyspnea 0-4      Progression   Progression Continue to progress workloads to maintain intensity without signs/symptoms of physical distress.      Resistance Training   Training Prescription Yes    Weight red bands    Reps 10-15             Perform Capillary Blood Glucose checks as needed.  Exercise Prescription Changes:   Exercise  Prescription Changes     Row Name 11/16/23 1501             Response to Exercise   Blood Pressure (Exercise) 142/60       Blood Pressure (Exit) 142/60       Heart Rate (Admit) 65 bpm       Heart Rate (Exercise) 70 bpm       Heart Rate (Exit) 62 bpm       Oxygen Saturation (Admit) 93 %       Oxygen Saturation (Exercise) 92 %       Oxygen Saturation (Exit) 92 %       Rating of Perceived Exertion (Exercise) 13       Perceived Dyspnea (Exercise) 2       Duration Progress to 30 minutes of  aerobic without signs/symptoms of physical distress       Intensity THRR unchanged         Progression   Progression Continue to progress workloads to maintain intensity without signs/symptoms of physical distress.         Resistance Training   Training Prescription Yes       Weight red bands       Reps 10-15  Time 10 Minutes         Track   Laps 7       Minutes 29       METs 2.08                Exercise Comments:   Exercise Comments     Row Name 11/16/23 1531           Exercise Comments Pt completed first day of group exercise. He ambulated 29 min with rests after every lap, METs 1.44, with rollator. He is significantly deconditioned. Pt needed to sit for majority of cool down. Did leg extensions instead of squats. Did not discuss METs today.                Exercise Goals and Review:   Exercise Goals     Row Name 10/25/23 1340             Exercise Goals   Increase Physical Activity Yes       Intervention Provide advice, education, support and counseling about physical activity/exercise needs.;Develop an individualized exercise prescription for aerobic and resistive training based on initial evaluation findings, risk stratification, comorbidities and participant's personal goals.       Expected Outcomes Short Term: Attend rehab on a regular basis to increase amount of physical activity.;Long Term: Exercising regularly at least 3-5 days a week.;Long Term: Add  in home exercise to make exercise part of routine and to increase amount of physical activity.       Increase Strength and Stamina Yes       Intervention Provide advice, education, support and counseling about physical activity/exercise needs.;Develop an individualized exercise prescription for aerobic and resistive training based on initial evaluation findings, risk stratification, comorbidities and participant's personal goals.       Expected Outcomes Short Term: Increase workloads from initial exercise prescription for resistance, speed, and METs.;Short Term: Perform resistance training exercises routinely during rehab and add in resistance training at home;Long Term: Improve cardiorespiratory fitness, muscular endurance and strength as measured by increased METs and functional capacity ( )       Able to understand and use rate of perceived exertion (RPE) scale Yes       Intervention Provide education and explanation on how to use RPE scale       Expected Outcomes Short Term: Able to use RPE daily in rehab to express subjective intensity level;Long Term:  Able to use RPE to guide intensity level when exercising independently       Able to understand and use Dyspnea scale Yes       Intervention Provide education and explanation on how to use Dyspnea scale       Expected Outcomes Short Term: Able to use Dyspnea scale daily in rehab to express subjective sense of shortness of breath during exertion;Long Term: Able to use Dyspnea scale to guide intensity level when exercising independently       Knowledge and understanding of Target Heart Rate Range (THRR) Yes       Intervention Provide education and explanation of THRR including how the numbers were predicted and where they are located for reference       Expected Outcomes Short Term: Able to state/look up THRR;Long Term: Able to use THRR to govern intensity when exercising independently;Short Term: Able to use daily as guideline for intensity in  rehab       Understanding of Exercise Prescription Yes       Intervention Provide education,  explanation, and written materials on patient's individual exercise prescription       Expected Outcomes Short Term: Able to explain program exercise prescription;Long Term: Able to explain home exercise prescription to exercise independently                Exercise Goals Re-Evaluation :  Exercise Goals Re-Evaluation     Row Name 11/20/23 0731             Exercise Goal Re-Evaluation   Exercise Goals Review Increase Physical Activity;Able to understand and use Dyspnea scale;Understanding of Exercise Prescription;Increase Strength and Stamina;Knowledge and understanding of Target Heart Rate Range (THRR);Able to understand and use rate of perceived exertion (RPE) scale       Comments Pt has completed 1 day of group exercise. He missed 4 sessions for illness. He ambulated 29 min with rests after every lap, 1505 ft, METs 1.44, with rollator. He is significantly deconditioned. Will encourage decreasing length of rest breaks. Pt needed to sit for majority of cool down. Does leg extensions instead of squats.Used red bands, 3. 7 lbs. Will work on progression.       Expected Outcomes Through exercise at rehab and home, the patient will decrease shortness of breath with daily activities and feel confident in carrying out an exercise regimen at home.                Discharge Exercise Prescription (Final Exercise Prescription Changes):  Exercise Prescription Changes - 11/16/23 1501       Response to Exercise   Blood Pressure (Exercise) 142/60    Blood Pressure (Exit) 142/60    Heart Rate (Admit) 65 bpm    Heart Rate (Exercise) 70 bpm    Heart Rate (Exit) 62 bpm    Oxygen Saturation (Admit) 93 %    Oxygen Saturation (Exercise) 92 %    Oxygen Saturation (Exit) 92 %    Rating of Perceived Exertion (Exercise) 13    Perceived Dyspnea (Exercise) 2    Duration Progress to 30 minutes of  aerobic  without signs/symptoms of physical distress    Intensity THRR unchanged      Progression   Progression Continue to progress workloads to maintain intensity without signs/symptoms of physical distress.      Resistance Training   Training Prescription Yes    Weight red bands    Reps 10-15    Time 10 Minutes      Track   Laps 7    Minutes 29    METs 2.08             Nutrition:  Target Goals: Understanding of nutrition guidelines, daily intake of sodium 1500mg , cholesterol 200mg , calories 30% from fat and 7% or less from saturated fats, daily to have 5 or more servings of fruits and vegetables.  Biometrics:    Nutrition Therapy Plan and Nutrition Goals:  Nutrition Therapy & Goals - 11/16/23 1547       Nutrition Therapy   Diet General Healthy Diet      Personal Nutrition Goals   Nutrition Goal Patient to improve diet quality by using the plate method as a guide for meal planning to include lean protein/plant protein, fruits, vegetables, whole grains, nonfat dairy as part of a well-balanced diet.    Personal Goal #2 Patient to identify strategies for blood sugar control with goal A1c <7%    Personal Goal #3 Patient to identify strategies for weight loss with goal of 0.5-2.0# per week.  Comments Eilan has medical history of HTN, CHF, COPD, DM2, diverticulitis s/p colectomy with ileostomy, alchoholism (triglycerides 522). He continues to smoke. He continues follow-up with ostomy clinic for ostomy wound/nonhealing abdominal wound; last seen on 10/31/23. Per ostomy clinic notes, He underwent abdominal surgery in 2021 for treatment of a perforated abscess diverticula.  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  He also has ileostomy which he would like to have reversed at some point, but reversal has been delayed due to the nonhealing wound as well as his obesity and current chronic use of tobacco. Per [facility] on 08/29/23, he is not considered a surgical  candidate for ileostomy takedown and abdominal wall reconstruction at this time but was referred to [facility] in [place] and [facility] in [place]. A1c is not at goal. He is motivated to lose weight; will continue to address strategies for weight loss including calorie density, the plate method as a guide for meal planning, etc. Patient will benefit from participation in intensive cardiac rehab for nutrition education, exercise, and lifestyle modification.      Intervention Plan   Intervention Prescribe, educate and counsel regarding individualized specific dietary modifications aiming towards targeted core components such as weight, hypertension, lipid management, diabetes, heart failure and other comorbidities.;Nutrition handout(s) given to patient.    Expected Outcomes Short Term Goal: Understand basic principles of dietary content, such as calories, fat, sodium, cholesterol and nutrients.;Long Term Goal: Adherence to prescribed nutrition plan.             Nutrition Assessments:  MEDIFICTS Score Key: >=70 Need to make dietary changes  40-70 Heart Healthy Diet <= 40 Therapeutic Level Cholesterol Diet   Picture Your Plate Scores: <16 Unhealthy dietary pattern with much room for improvement. 41-50 Dietary pattern unlikely to meet recommendations for good health and room for improvement. 51-60 More healthful dietary pattern, with some room for improvement.  >60 Healthy dietary pattern, although there may be some specific behaviors that could be improved.    Nutrition Goals Re-Evaluation:  Nutrition Goals Re-Evaluation     Row Name 11/16/23 1547             Goals   Current Weight 237 lb 14 oz (107.9 kg)       Comment Cr 1.54, GFr 47, A1c 7.3, HDL 32, Triglycerides 522       Expected Outcome Keena has medical history of HTN, CHF, COPD, DM2, diverticulitis s/p colectomy with ileostomy, alchoholism (triglycerides 522). He continues to smoke. He  continues follow-up with ostomy clinic for ostomy wound/nonhealing abdominal wound; last seen on 10/31/23. Per ostomy clinic notes, He underwent abdominal surgery in 2021 for treatment of a perforated abscess diverticula. 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 He also has ileostomy which he would like to have reversed at some point, but reversal has been delayed due to the nonhealing wound as well as his obesity and current chronic use of tobacco. Per [facility] on 08/29/23, he is not considered a surgical candidate for ileostomy takedown and abdominal wall reconstruction at this time but was referred to [facility] in [place] and [facility] in [place]. A1c is not at goal. He is motivated to lose weight; will continue to address strategies for weight loss including calorie density, the plate method as a guide for meal planning, etc. Patient will benefit from participation in intensive cardiac rehab for nutrition education, exercise, and  lifestyle modification.                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 He continues to smoke. He continues follow-up with ostomy clinic for ostomy wound/nonhealing abdominal wound; last seen on 10/31/23. Per ostomy clinic notes, He underwent abdominal surgery in 2021 for treatment of a perforated abscess diverticula. 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 He also has ileostomy which he would like to have reversed at some point, but reversal has been delayed due to the nonhealing wound as well as his obesity and current chronic use of tobacco. Per [facility]  general surgery on 08/29/23, he is not considered a surgical candidate for ileostomy takedown and abdominal wall reconstruction at this time but was referred to [facility] in [place] and [facility] in [place]. A1c is not at goal. He is motivated to lose weight; will continue to address strategies for weight loss including calorie density, the plate method as a guide for meal planning, etc. Patient will benefit from participation in intensive cardiac rehab for nutrition education, exercise, and lifestyle modification.             Psychosocial: Target Goals: Acknowledge presence or absence of significant depression and/or stress, maximize coping skills, provide positive support system. Participant is able to verbalize types and ability to use techniques and skills needed for reducing stress and depression.  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 He also states he has chronic pain from both.      Family Dynamics   Good Support System? Yes      Barriers   Psychosocial barriers to participate in program The patient should benefit from training in stress management and relaxation.      Screening Interventions   Interventions Encouraged to exercise;Provide feedback about the scores to participant;To provide support and resources with identified psychosocial needs    Expected Outcomes Short Term goal: Utilizing psychosocial counselor, staff and physician to assist with identification of specific Stressors or current issues interfering with  healing process. Setting desired goal for each stressor or current issue identified.;Long Term Goal: Stressors or current issues are controlled or eliminated.;Short Term goal: Identification and review with participant of any Quality of Life or Depression concerns found by scoring the questionnaire.;Long Term goal: The participant improves quality of Life and PHQ9 Scores as seen by post scores and/or verbalization of changes             Quality of Life Scores:  Scores of 19 and below usually indicate a poorer quality of life in these areas.  A difference of  2-3 points is a clinically meaningful difference.  A difference of 2-3 points in the total  score of the Quality of Life Index has been associated with significant improvement in overall quality of life, self-image, physical symptoms, and general health in studies assessing change in quality of life.  PHQ-9: Review Flowsheet  More data exists      10/25/2023 10/20/2023 10/12/2023 06/15/2023 04/18/2023  Depression screen PHQ 2/9  Decreased Interest 1 0 0 0 3  Down, Depressed, Hopeless 1 - 3 0 3  PHQ - 2 Score 2 0 3 0 6  Altered sleeping 1 - 2 - 3  Tired, decreased energy 1 - 2 - 3  Change in appetite 0 - 0 - 0  Feeling bad or failure about yourself  1 - 1 - 1  Trouble concentrating 1 - 1 - 1  Moving slowly or fidgety/restless 0 - 0 - 0  Suicidal thoughts 0 - 0 - 0  PHQ-9 Score 6 - 9 - 14  Difficult doing work/chores Somewhat difficult - Very difficult - Very difficult   Interpretation of Total Score  Total Score Depression Severity:  1-4 = Minimal depression, 5-9 = Mild depression, 10-14 = Moderate depression, 15-19 = Moderately severe depression, 20-27 = Severe depression   Psychosocial Evaluation and Intervention:  Psychosocial Evaluation - 10/25/23 1344       Psychosocial Evaluation & Interventions   Interventions Stress management education;Encouraged to exercise with the program and follow exercise prescription    Comments Pt  scored a 6 on PHQ-9. Pt denies current possible depression although speaking with patient there are possible signs. Pt currently declines any additional resources or referrals. 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 He also states he has chronic pain from both. We will continue to assess and monitor his needs.    Expected Outcomes For Cortavius to attend pulmonary rehab and to have a positive outlook and good coping skills to manage his possible depression and stress.    Continue Psychosocial Services  Follow up required by staff             Psychosocial Re-Evaluation:  Psychosocial Re-Evaluation     Row Name 11/20/23 1405             Psychosocial Re-Evaluation   Current issues with Current Stress Concerns       Comments Psychosocial monthly re-evaluation is as follows: No changes since orientation. Felice has attended 1 class so far.       Expected Outcomes For Grahm to attend pulmonary rehab and to have a positive outlook and good coping skills to manage his possible depression and stress.       Interventions Encouraged to attend Pulmonary Rehabilitation for the exercise       Continue Psychosocial Services  Follow up required by staff                Psychosocial Discharge (Final Psychosocial Re-Evaluation):  Psychosocial Re-Evaluation - 11/20/23 1405       Psychosocial Re-Evaluation   Current issues with Current Stress Concerns    Comments Psychosocial monthly re-evaluation is as follows: No changes since orientation. Derreon has attended 1 class so far.    Expected Outcomes For Chibuikem to attend pulmonary rehab and to have a positive outlook and good coping skills to manage his possible depression and stress.    Interventions Encouraged to attend Pulmonary Rehabilitation for the exercise    Continue Psychosocial Services  Follow up required by staff  Education: Education Goals: Education classes will be provided on a weekly basis, covering required topics. Participant will state understanding/return demonstration of topics presented.  Learning Barriers/Preferences:  Learning Barriers/Preferences - 10/25/23 1344       Learning Barriers/Preferences   Learning Barriers Sight;Exercise Concerns    Learning Preferences None             Education Topics: Know Your Numbers Group instruction that is supported by a PowerPoint presentation. Instructor discusses importance of knowing and understanding resting, exercise, and post-exercise oxygen saturation, heart rate, and blood pressure. Oxygen saturation, heart rate, blood pressure, rating of perceived exertion, and dyspnea are reviewed along with a normal range for these values.    Exercise for the Pulmonary Patient Group instruction that is supported by a PowerPoint presentation. Instructor discusses benefits of exercise, core components of exercise, frequency, duration, and intensity of an exercise routine, importance of utilizing pulse oximetry during exercise, safety while exercising, and options of places to exercise outside of rehab.    MET Level  Group instruction provided by PowerPoint, verbal discussion, and written material to support subject matter. Instructor reviews what METs are and how to increase METs.    Pulmonary Medications Verbally interactive group education provided by instructor with focus on inhaled medications and proper administration.   Anatomy and Physiology of the Respiratory System Group instruction provided by PowerPoint, verbal discussion, and written material to support subject matter. Instructor reviews respiratory cycle and anatomical components of the respiratory system and their functions. Instructor also reviews differences in obstructive and restrictive respiratory diseases with examples of each.  Flowsheet Row PULMONARY REHAB CHRONIC  OBSTRUCTIVE PULMONARY DISEASE from 11/16/2023 in [facility]  Date 11/16/23  Educator RT  Instruction Review Code 1- Verbalizes Understanding       Oxygen Safety Group instruction provided by PowerPoint, verbal discussion, and written material to support subject matter. There is an overview of "What is Oxygen" and "Why do we need it".  Instructor also reviews how to create a safe environment for oxygen use, the importance of using oxygen as prescribed, and the risks of noncompliance. There is a brief discussion on traveling with oxygen and resources the patient may utilize.   Oxygen Use Group instruction provided by PowerPoint, verbal discussion, and written material to discuss how supplemental oxygen is prescribed and different types of oxygen supply systems. Resources for more information are provided.    Breathing Techniques Group instruction that is supported by demonstration and informational handouts. Instructor discusses the benefits of pursed lip and diaphragmatic breathing and detailed demonstration on how to perform both.     Risk Factor Reduction Group instruction that is supported by a PowerPoint presentation. Instructor discusses the definition of a risk factor, different risk factors for pulmonary disease, and how the heart and lungs work together.   Pulmonary Diseases Group instruction provided by PowerPoint, verbal discussion, and written material to support subject matter. Instructor gives an overview of the different type of pulmonary diseases. There is also a discussion on risk factors and symptoms as well as ways to manage the diseases.   Stress and Energy Conservation Group instruction provided by PowerPoint, verbal discussion, and written material to support subject matter. Instructor gives an overview of stress and the impact it can have on the body. Instructor also reviews ways to reduce stress. There is  also a discussion on energy conservation and ways to conserve energy throughout the day.   Warning Signs  and Symptoms Group instruction provided by PowerPoint, verbal discussion, and written material to support subject matter. Instructor reviews warning signs and symptoms of stroke, heart attack, cold and flu. Instructor also reviews ways to prevent the spread of infection.   Other Education Group or individual verbal, written, or video instructions that support the educational goals of the pulmonary rehab program.    Knowledge Questionnaire Score:  Knowledge Questionnaire Score - 10/25/23 1546       Knowledge Questionnaire Score   Pre Score 12/18             Core Components/Risk Factors/Patient Goals at Admission:  Personal Goals and Risk Factors at Admission - 10/25/23 1344       Core Components/Risk Factors/Patient Goals on Admission    Weight Management Yes;Obesity;Weight Loss    Intervention Weight Management: Develop a combined nutrition and exercise program designed to reach desired caloric intake, while maintaining appropriate intake of nutrient and fiber, sodium and fats, and appropriate energy expenditure required for the weight goal.;Weight Management: Provide education and appropriate resources to help participant work on and attain dietary goals.;Weight Management/Obesity: Establish reasonable short term and long term weight goals.;Obesity: Provide education and appropriate resources to help participant work on and attain dietary goals.    Admit Weight 69 lb 2.9 oz (31.4 kg)    Expected Outcomes Short Term: Continue to assess and modify interventions until short term weight is achieved;Long Term: Adherence to nutrition and physical activity/exercise program aimed toward attainment of established weight goal;Weight Loss: Understanding of general recommendations for a balanced deficit meal plan, which promotes 1-2 lb weight loss per week and includes a negative energy  balance of [id number] kcal/d;Understanding recommendations for meals to include 15-35% energy as protein, 25-35% energy from fat, 35-60% energy from carbohydrates, less than 200mg  of dietary cholesterol, 20-35 gm of total fiber daily;Understanding of distribution of calorie intake throughout the day with the consumption of 4-5 meals/snacks    Tobacco Cessation Yes    Number of packs per day 1/2    Intervention Assist the participant in steps to quit. Provide individualized education and counseling about committing to Tobacco Cessation, relapse prevention, and pharmacological support that can be provided by physician.;Education officer, environmental, assist with locating and accessing local/national Quit Smoking programs, and support quit date choice.    Expected Outcomes Short Term: Will demonstrate readiness to quit, by selecting a quit date.;Long Term: Complete abstinence from all tobacco products for at least 12 months from quit date.;Short Term: Will quit all tobacco product use, adhering to prevention of relapse plan.    Improve shortness of breath with ADL's Yes    Intervention Provide education, individualized exercise plan and daily activity instruction to help decrease symptoms of SOB with activities of daily living.    Expected Outcomes Short Term: Improve cardiorespiratory fitness to achieve a reduction of symptoms when performing ADLs;Long Term: Be able to perform more ADLs without symptoms or delay the onset of symptoms    Stress Yes    Intervention Offer individual and/or small group education and counseling on adjustment to heart disease, stress management and health-related lifestyle change. Teach and support self-help strategies.;Refer participants experiencing significant psychosocial distress to appropriate mental health specialists for further evaluation and treatment. When possible, include family members and significant others in education/counseling sessions.    Expected Outcomes Short  Term: Participant demonstrates changes in health-related behavior, relaxation and other stress management skills, ability to obtain effective social support, and compliance with psychotropic medications if prescribed.;Long Term:  Emotional wellbeing is indicated by absence of clinically significant psychosocial distress or social isolation.             Core Components/Risk Factors/Patient Goals Review:   Goals and Risk Factor Review     Row Name 11/20/23 1406             Core Components/Risk Factors/Patient Goals Review   Personal Goals Review Weight Management/Obesity;Improve shortness of breath with ADL's;Develop more efficient breathing techniques such as purse lipped breathing and diaphragmatic breathing and practicing self-pacing with activity.;Stress;Tobacco Cessation       Review Monthly review of patient's Core Components/Risk Factors/Patient Goals are as follows: Unable to assess goals yet. Tramain has attended 1 class so far. Brandin will continue to benefit from PR for nutrition, education, exercise, and lifestyle modification.       Expected Outcomes Pt will show progress toward meeting expected goals and outcomes.                Core Components/Risk Factors/Patient Goals at Discharge (Final Review):   Goals and Risk Factor Review - 11/20/23 1406       Core Components/Risk Factors/Patient Goals Review   Personal Goals Review Weight Management/Obesity;Improve shortness of breath with ADL's;Develop more efficient breathing techniques such as purse lipped breathing and diaphragmatic breathing and practicing self-pacing with activity.;Stress;Tobacco Cessation    Review Monthly review of patient's Core Components/Risk Factors/Patient Goals are as follows: Unable to assess goals yet. Hutson has attended 1 class so far. Khylen will continue to benefit from PR for nutrition, education, exercise, and lifestyle modification.    Expected Outcomes Pt will show progress toward meeting expected  goals and outcomes.             ITP Comments: Pt is making expected progress toward Pulmonary Rehab goals after completing 1 session(s). Recommend continued exercise, life style modification, education, and utilization of breathing techniques to increase stamina and strength, while also decreasing shortness of breath with exertion.   Comments: Dr. Genetta Kenning is Medical Director for Pulmonary Rehab at [facility]

## 2023-11-23 ENCOUNTER — Telehealth: Payer: Self-pay | Admitting: Podiatry

## 2023-11-23 ENCOUNTER — Telehealth (HOSPITAL_COMMUNITY): Payer: Self-pay

## 2023-11-23 ENCOUNTER — Encounter (HOSPITAL_COMMUNITY): Admission: RE | Admit: 2023-11-23 | Source: Ambulatory Visit

## 2023-11-23 ENCOUNTER — Other Ambulatory Visit (HOSPITAL_COMMUNITY): Payer: Self-pay

## 2023-11-23 ENCOUNTER — Other Ambulatory Visit: Payer: Self-pay | Admitting: Nurse Practitioner

## 2023-11-23 ENCOUNTER — Other Ambulatory Visit: Payer: Self-pay | Admitting: Podiatry

## 2023-11-23 ENCOUNTER — Other Ambulatory Visit: Payer: Self-pay

## 2023-11-23 DIAGNOSIS — Z419 Encounter for procedure for purposes other than remedying health state, unspecified: Secondary | ICD-10-CM | POA: Diagnosis not present

## 2023-11-23 MED ORDER — GABAPENTIN 300 MG PO CAPS
600.0000 mg | ORAL_CAPSULE | Freq: Two times a day (BID) | ORAL | 3 refills | Status: DC
  Filled 2023-11-23 – 2023-11-28 (×4): qty 90, 23d supply, fill #0
  Filled 2023-12-31 – 2024-01-01 (×2): qty 90, 23d supply, fill #1

## 2023-11-23 MED ORDER — FOLIC ACID 1 MG PO TABS
1.0000 mg | ORAL_TABLET | Freq: Every day | ORAL | 0 refills | Status: DC
  Filled 2023-11-23 – 2023-11-28 (×4): qty 90, 90d supply, fill #0

## 2023-11-23 NOTE — Telephone Encounter (Signed)
 Patient c/o for 1:15pm PR class, left message stating he's been having a bad week dealing with his ostomy bag and will be in next week for class.

## 2023-11-23 NOTE — Progress Notes (Deleted)
  Electrophysiology Office Follow up Visit Note:    Date:  11/23/2023   ID:  Luis [name], DOB [birth year], MRN [id number]  PCP:  Zorita Hiss, NP  [facility] Cardiologist:  None  [facility] Electrophysiologist:  Boyce Byes, MD    Interval History:     Luis [name] is a 63 y.o. male who presents for a follow up visit.   The patient Luis [name] July 17, 2023.  He has a history of hypertension, hyperlipidemia, diverticulitis, complete heart block, chronic diastolic heart failure, COPD, DVT/PE.  He has a Micra AV that was implanted in July 2022.       Past medical, surgical, social and family history were reviewed.  ROS:   Please see the history of present illness.    All other systems reviewed and are negative.  EKGs/Labs/Other Studies Reviewed:    The following studies were reviewed today:  November 24, 2023 in clinic device interrogation personally reviewed ***        Physical Exam:    VS:  There were no vitals taken for this visit.    Wt Readings from Last 3 Encounters:  11/21/23 241 lb 6.5 oz (109.5 kg)  10/25/23 237 lb 14 oz (107.9 kg)  10/25/23 237 lb (107.5 kg)     GEN: no distress CARD: RRR, No MRG RESP: No IWOB. CTAB.      ASSESSMENT:    No diagnosis found. PLAN:    In order of problems listed above:  #Complete heart block #Permanent pacemaker in situ Device functioning appropriately.  Continue remote monitoring.  #Atrial flutter Not on anticoagulation at patient's request given his multiple medical conditions  #Chronic diastolic heart failure Follows with Dr. Bruce Caper in the heart failure clinic.  Follow-up with EP APP in 1 year.   Signed, Harvie Liner, MD, [facility], [facility] 11/23/2023 9:50 PM    Electrophysiology [facility]

## 2023-11-23 NOTE — Telephone Encounter (Signed)
 Patient states Dr.told him he could take 600mg  of gabapentin   in morning and night. His current prescription is 300 mg and he has run out taking 2 in morning and 2 at night. Could you refill the prescription ? Gabapentin  is helping some. Preferred pharmacy [facility]. Phone: [phone]  Fax: [phone]

## 2023-11-24 ENCOUNTER — Other Ambulatory Visit (HOSPITAL_COMMUNITY): Payer: Self-pay

## 2023-11-24 ENCOUNTER — Other Ambulatory Visit: Payer: Self-pay

## 2023-11-24 ENCOUNTER — Ambulatory Visit: Payer: Medicaid Other | Admitting: Cardiology

## 2023-11-24 DIAGNOSIS — I442 Atrioventricular block, complete: Secondary | ICD-10-CM

## 2023-11-24 DIAGNOSIS — I5032 Chronic diastolic (congestive) heart failure: Secondary | ICD-10-CM

## 2023-11-24 DIAGNOSIS — Z950 Presence of cardiac pacemaker: Secondary | ICD-10-CM

## 2023-11-28 ENCOUNTER — Ambulatory Visit: Admitting: Primary Care

## 2023-11-28 ENCOUNTER — Encounter (HOSPITAL_COMMUNITY): Admission: RE | Admit: 2023-11-28 | Source: Ambulatory Visit

## 2023-11-28 ENCOUNTER — Telehealth (HOSPITAL_COMMUNITY): Payer: Self-pay

## 2023-11-28 ENCOUNTER — Other Ambulatory Visit (HOSPITAL_COMMUNITY): Payer: Self-pay

## 2023-11-28 NOTE — Progress Notes (Deleted)
 @Patient  ID: Luis [name], male    DOB: Sep 11, 1960, 63 y.o.   MRN: [id number]  No chief complaint on file.   Referring provider: Zorita Hiss, NP  HPI:  63 year old male, current every day smoker.  Past medical history significant for hypertension, CHF, AV heart block, demand ischemia, COPD, community-acquired pneumonia, allergic rhinitis, abdominal hernia, intra-abdominal abscess, status post ileostomy, type 2 diabetes, history of DVT, HLD, alcoholism, obesity.  Previous [facility] pulmonary encounter: 04/26/23- Dr. Marygrace Snellen 63 y.o. man whom we are seeing for evaluation of dyspnea on exertion.  Multiple cardiology notes reviewed.  Documentation from hospitalizations reviewed.  He has been short of breath for some time.  Underwent significant surgeries couple years ago.  Since then been short of breath.  Less active.  Previously was very active.  He continues to smoke.  Dyspneic with minimal exertion.  At rest is okay.  12/2022 get a CT scan while hospitalized that showed small bilateral effusions and bilateral dependent atelectasis versus infiltrate.  He was treated for pneumonia.  Frankly it looks like atelectasis.  12/2022 he had TTE with grade 2 diastolic dysfunction and dilated LA.  02/2023 he had a chest x-ray that looks volume overloaded with small bilateral pleural effusions, interlobular septal thickening, and enlarged heart.  In terms of improving his dyspnea, he states things are better with decreased swelling in his legs.  He is increased his Lasix  recently.  Breathing has improved.  He has tried nebulizers and inhalers.  Does not seem to help much.  Only using as needed.  We discussed a strategy using on a scheduled basis to see if there is any improvement throughout the day.  He expressed understanding.  10/03/2023- interim hx  Discussed the use of AI scribe software for clinical note transcription with the patient, who gave verbal consent to proceed.  History of Present Illness   Luis [name] is a 63 year old male with COPD and diastolic heart failure who presents with shortness of breath. He was referred by Dr. Marygrace Snellen for evaluation of shortness of breath.  He has a history of COPD, confirmed by a recent breathing test showing stage 2 COPD with lung function at 58% predicted. He experiences shortness of breath with exertion, such as walking in a grocery store, which sometimes makes him feel like he might pass out. He also has a chronic cough with mucus production, worse at night, somewhat relieved by Mucinex . No wheezing or chest tightness, but he occasionally coughs to clear his airways when he hears himself wheezing. He is currently using Flovent  and a nebulizer but does not use the inhaler consistently. He has been hospitalized three times in the last eight months for pneumonia, with the most recent hospitalization on August 06, 2023, lasting eight days. He believes his breathing issues have been somewhat controlled at home recently.  He has a significant smoking history of 47 years and continues to smoke. He reports a history of alcohol use, stating he drinks 'a little bit.' He denies any issues with aspiration, such as trouble swallowing or choking when eating, and has not experienced any episodes of food going down the windpipe. He mentions a history of incarceration where he sustained an abdominal wound, stating he has been home from prison for four and a half years and has not vomited since his release.  He has a history of diastolic heart failure and was hospitalized for congestive heart failure within the last six months. He is awaiting surgery for a  ventral hernia and ileostomy reversal, which has been delayed due to his heart condition. He reports gaining weight, which he attributes to fluid retention, and is on diuretics to manage this. No current symptoms of acid reflux, stomach pain, or trouble swallowing, but he mentions a history of an abdominal wound and an  ileostomy.    1. Stage 2 moderate COPD by GOLD classification (HCC) (Primary)  2. Abdominal wall hernia at previous stoma site  3. Chronic diastolic CHF (congestive heart failure) (HCC)  Assessment and Plan    Stage 2 COPD Moderate COPD with 58% lung function. Symptoms include exertional dyspnea and chronic productive cough. No reversibility on spirometry. Significant impact on quality of life. Recommend coming off inhaled corticosteroids as this can increase risk of pneumonia. No peripheral eosinophilia or reversibility on PFTs.  - Discontinue Flovent  inhaler. - Prescribe Stiolto Respimat  2 puffs every morning  - Continue Mucinex  600mg  twice daily and sending in RX promethazine -dm QID prn cough/chest congestion   Recurrent pneumonia Three hospitalizations in the last 12 months, last in February for acute respiratory failure secondary to CAP, COPD exacerbation and acute on chronic diastolic heart failure. No pneumonia since February, recent chest x-ray in March 2024 showed clear lungs.   Congestive heart failure Contributing to dyspnea and exertional limitations. Managed with diuretics. Awaiting cardiac clearance for ileostomy reversal surgery.  Ventral hernia Increases risk of aspiration and respiratory symptoms. Surgical repair planned post-cardiac clearance and ileostomy reversal.  Ileostomy status Current ileostomy with planned reversal pending cardiac clearance. No current issues reported.   Follow-up 6-8 weeks with Dr. Marygrace Snellen or Jerlene Moody NP     40 mins spent on case: > 50% face to face with patient     11/28/2023- Interim hx  Patient presents today for 6-8 week follow-up moderate COPD, recurrent pneumonia   Discontinue Flovent  inhaler. - Prescribe Stiolto Respimat  2 puffs every morning  - Continue Mucinex  600mg  twice daily and sending in RX promethazine -dm QID prn cough/chest congestion   Recurrent pneumonia Three hospitalizations in the last 12 months, last in  February for acute respiratory failure secondary to CAP, COPD exacerbation and acute on chronic diastolic heart failure. No pneumonia since February, recent chest x-ray in March 2024 showed clear lungs.       Pulmonary function testing  10/03/2023 PFT>> FVC 3.02 (65%), FEV1 2.04 (58%), ratio 68 Moderate obstructive airway disease without bronchodilator response        Allergies  Allergen Reactions   Lisinopril     Other reaction(s): renal effects   Codeine Other (See Comments) and Hives    unknown Other reaction(s): Other (See Comments) unknown unknown    Immunization History  Administered Date(s) Administered   Influenza, Seasonal, Injecte, Preservative Fre 03/02/2023   PNEUMOCOCCAL CONJUGATE-20 03/02/2023   Respiratory Syncytial Virus Vaccine ,Recomb Aduvanted(Arexvy ) 07/18/2023   Zoster Recombinant(Shingrix ) 07/18/2023    Past Medical History:  Diagnosis Date   Acute respiratory failure with hypoxia (HCC) 12/23/2022   Alcohol withdrawal syndrome, with delirium (HCC) 12/27/2022   COPD (chronic obstructive pulmonary disease) (HCC)    Diabetes mellitus without complication (HCC)    Diverticulitis    DVT (deep venous thrombosis) (HCC)    x3   Encephalopathy acute 12/28/2022   ETOH abuse    H/O colectomy    Hyperlipidemia    Hypertension    Sepsis associated hypotension (HCC) 08/03/2019   Smoker     Tobacco History: Social History   Tobacco Use  Smoking Status Every Day   Current packs/day:  0.75   Average packs/day: 0.7 packs/day for 48.5 years (36.3 ttl pk-yrs)   Types: Cigarettes   Start date: 1977  Smokeless Tobacco Current  Tobacco Comments   Less than .5pk daily 10/03/23   Ready to quit: Not Answered Counseling given: Not Answered Tobacco comments: Less than .5pk daily 10/03/23   Outpatient Medications Prior to Visit  Medication Sig Dispense Refill   gabapentin  (NEURONTIN ) 300 MG capsule Take 2 capsules (600 mg total) by mouth 2 (two) times  daily. 90 capsule 3   Accu-Chek Softclix Lancets lancets Use to test blood sugar once daily 100 each 11   acetaminophen  (ACETAMINOPHEN  8 HOUR) 650 MG CR tablet Take 1 tablet (650 mg total) by mouth every 8 (eight) hours as needed for pain (Patient taking differently: Take 1,300 mg by mouth every 8 (eight) hours as needed for pain or fever.) 90 tablet 1   amLODipine  (NORVASC ) 10 MG tablet Take 1 tablet (10 mg total) by mouth daily. 90 tablet 0   ascorbic acid  (VITAMIN C ) 500 MG tablet Take 1 tablet (500 mg total) by mouth daily. 30 tablet 1   atorvastatin  (LIPITOR) 10 MG tablet Take 1 tablet (10 mg total) by mouth daily. 90 tablet 1   Blood Glucose Monitoring Suppl (ACCU-CHEK GUIDE) w/Device KIT Use to test blood sugar once daily 1 kit 0   cetirizine  (ZYRTEC ) 10 MG tablet Take 1 tablet (10 mg total) by mouth daily. 90 tablet 1   dapagliflozin  propanediol (FARXIGA ) 10 MG TABS tablet Take 1 tablet (10 mg total) by mouth daily before breakfast. 90 tablet 2   folic acid  (FOLVITE ) 1 MG tablet Take 1 tablet (1 mg total) by mouth daily. 90 tablet 0   gabapentin  (NEURONTIN ) 300 MG capsule Take 1 capsule (300 mg total) by mouth 2 (two) times daily. (Patient taking differently: Take 600 mg by mouth at bedtime.) 90 capsule 3   glucose blood (ACCU-CHEK GUIDE TEST) test strip Use to test blood sugar once daily 100 each 11   guaiFENesin  (MUCINEX ) 600 MG 12 hr tablet Take 1 tablet (600 mg total) by mouth 2 (two) times daily as needed to loosen phlegm or cough. 180 tablet 1   hydrOXYzine  (VISTARIL ) 50 MG capsule Take 1 capsule (50 mg total) by mouth at bedtime as needed. 30 capsule 2   ipratropium-albuterol  (DUONEB) 0.5-2.5 (3) MG/3ML SOLN Take 3 mLs by nebulization every 6 (six) hours as needed.     isosorbide  mononitrate (IMDUR ) 30 MG 24 hr tablet Take 1 tablet (30 mg total) by mouth daily. 30 tablet 0   losartan  (COZAAR ) 25 MG tablet Take 1 tablet (25 mg total) by mouth daily. 30 tablet 5   metFORMIN  (GLUCOPHAGE )  500 MG tablet Take 2 tablets (1,000 mg total) by mouth daily with breakfast AND 1 tablet (500 mg total) daily with supper. 90 tablet 3   metoprolol  succinate (TOPROL -XL) 25 MG 24 hr tablet Take 1 tablet (25 mg total) by mouth daily. 90 tablet 3   nicotine  (NICODERM CQ  - DOSED IN MG/24 HOURS) 21 mg/24hr patch Place 1 patch (21 mg total) onto the skin daily. 28 patch 0   thiamine  (VITAMIN B1) 100 MG tablet Take 1 tablet (100 mg total) by mouth daily. 90 tablet 0   Tiotropium Bromide -Olodaterol 2.5-2.5 MCG/ACT AERS Inhale 2 puffs into the lungs daily. 4 g 3   torsemide  (DEMADEX ) 20 MG tablet Take 2 tablets (40 mg total) by mouth daily.     triamcinolone  cream (KENALOG ) 0.5 %  Apply topically to legs twice daily for up to 2 weeks as needed for eczema 30 g 1   No facility-administered medications prior to visit.      Review of Systems  Review of Systems   Physical Exam  There were no vitals taken for this visit. Physical Exam   Lab Results:  CBC    Component Value Date/Time   WBC 12.5 (H) 10/05/2023 1201   RBC 4.19 (L) 10/05/2023 1201   HGB 14.6 10/05/2023 1201   HCT 43.7 10/05/2023 1201   PLT 293.0 10/05/2023 1201   MCV 104.5 (H) 10/05/2023 1201   MCH 34.2 (H) 08/11/2023 0528   MCHC 33.3 10/05/2023 1201   RDW 15.2 10/05/2023 1201   LYMPHSABS 1.3 08/11/2023 0528   MONOABS 1.1 (H) 08/11/2023 0528   EOSABS 0.0 08/11/2023 0528   BASOSABS 0.0 08/11/2023 0528    BMET    Component Value Date/Time   NA 135 10/20/2023 1332   NA 142 05/14/2021 1246   K 4.0 10/20/2023 1332   CL 93 (L) 10/20/2023 1332   CO2 28 10/20/2023 1332   GLUCOSE 205 (H) 10/20/2023 1332   BUN 56 (H) 10/20/2023 1332   BUN 11 05/14/2021 1246   CREATININE 1.54 (H) 10/20/2023 1332   CALCIUM  10.4 10/20/2023 1332   GFRNONAA >60 09/13/2023 1200   GFRAA >60 08/03/2019 0544    BNP    Component Value Date/Time   BNP 204.8 (H) 09/05/2023 0943    ProBNP No results found for: PROBNP  Imaging: No  results found.   Assessment & Plan:   No problem-specific Assessment & Plan notes found for this encounter.     Antonio Baumgarten, NP 11/28/2023

## 2023-11-28 NOTE — Telephone Encounter (Signed)
 Unable to leave a voicemail for pt regarding missed Pulm Rehab appt. Will try again tomorrow.

## 2023-11-29 ENCOUNTER — Other Ambulatory Visit (HOSPITAL_COMMUNITY): Payer: Self-pay

## 2023-11-29 ENCOUNTER — Telehealth: Payer: Self-pay

## 2023-11-29 ENCOUNTER — Other Ambulatory Visit: Payer: Self-pay | Admitting: Nurse Practitioner

## 2023-11-29 DIAGNOSIS — I10 Essential (primary) hypertension: Secondary | ICD-10-CM

## 2023-11-29 MED ORDER — AMLODIPINE BESYLATE 10 MG PO TABS
10.0000 mg | ORAL_TABLET | Freq: Every day | ORAL | 0 refills | Status: DC
  Filled 2023-11-29: qty 90, 90d supply, fill #0

## 2023-11-30 ENCOUNTER — Other Ambulatory Visit: Payer: Self-pay

## 2023-11-30 ENCOUNTER — Encounter (HOSPITAL_COMMUNITY)

## 2023-12-01 ENCOUNTER — Telehealth: Payer: Self-pay | Admitting: *Deleted

## 2023-12-01 ENCOUNTER — Telehealth: Payer: Self-pay

## 2023-12-01 ENCOUNTER — Other Ambulatory Visit (HOSPITAL_COMMUNITY): Payer: Self-pay

## 2023-12-01 NOTE — Telephone Encounter (Signed)
 LVM to call office and reschedule cancelled Lung CT on 11/22/2023 due to illness.

## 2023-12-05 ENCOUNTER — Other Ambulatory Visit (HOSPITAL_COMMUNITY): Payer: Self-pay

## 2023-12-05 ENCOUNTER — Encounter (HOSPITAL_COMMUNITY): Admission: RE | Admit: 2023-12-05 | Source: Ambulatory Visit

## 2023-12-05 ENCOUNTER — Other Ambulatory Visit (HOSPITAL_COMMUNITY): Payer: Self-pay | Admitting: Nurse Practitioner

## 2023-12-05 ENCOUNTER — Other Ambulatory Visit: Payer: Self-pay | Admitting: Nurse Practitioner

## 2023-12-05 NOTE — Addendum Note (Signed)
 Encounter addended by: Tarnisha Kachmar, RN on: 12/05/2023 2:11 PM  Actions taken: Vitals modified

## 2023-12-05 NOTE — Telephone Encounter (Unsigned)
 Copied from CRM [id number]. Topic: Clinical - Medication Refill >> Dec 05, 2023  9:51 AM Ernestene P wrote: Medication: torsemide  (DEMADEX ) 20 MG tablet  Has the patient contacted their pharmacy? Yes-pharmacy advise the rx was cancelled. (Agent: If no, request that the patient contact the pharmacy for the refill. If patient does not wish to contact the pharmacy document the reason why and proceed with request.) (Agent: If yes, when and what did the pharmacy advise?)  This is the patient's preferred pharmacy:  [place] [facility] [address] [place] [address] Phone: [phone] Fax: [phone]  Is this the correct pharmacy for this prescription? Yes If no, delete pharmacy and type the correct one.   Has the prescription been filled recently? No  Is the patient out of the medication? Yes- has 2 pills left  Has the patient been seen for an appointment in the last year OR does the patient have an upcoming appointment? Yes  Can we respond through MyChart? Yes  Agent: Please be advised that Rx refills may take up to 3 business days. We ask that you follow-up with your pharmacy.

## 2023-12-06 ENCOUNTER — Encounter: Payer: Self-pay | Admitting: *Deleted

## 2023-12-06 ENCOUNTER — Telehealth: Payer: Self-pay | Admitting: *Deleted

## 2023-12-06 ENCOUNTER — Other Ambulatory Visit (HOSPITAL_COMMUNITY): Payer: Self-pay

## 2023-12-06 ENCOUNTER — Telehealth (HOSPITAL_COMMUNITY): Payer: Self-pay

## 2023-12-06 ENCOUNTER — Other Ambulatory Visit: Payer: Self-pay

## 2023-12-06 MED ORDER — TORSEMIDE 20 MG PO TABS: 40.0000 mg | ORAL_TABLET | Freq: Every day | ORAL | Status: DC

## 2023-12-06 MED ORDER — TORSEMIDE 20 MG PO TABS
80.0000 mg | ORAL_TABLET | Freq: Every day | ORAL | 3 refills | Status: DC
  Filled 2023-12-06: qty 120, 30d supply, fill #0
  Filled 2024-01-12: qty 120, 30d supply, fill #1
  Filled 2024-02-12: qty 120, 30d supply, fill #2

## 2023-12-06 NOTE — Telephone Encounter (Signed)
 LVM for pt regarding participation in the [facility]. Awaiting call back

## 2023-12-07 ENCOUNTER — Encounter (HOSPITAL_COMMUNITY): Admission: RE | Admit: 2023-12-07 | Source: Ambulatory Visit

## 2023-12-07 ENCOUNTER — Other Ambulatory Visit (HOSPITAL_COMMUNITY): Payer: Self-pay

## 2023-12-07 ENCOUNTER — Telehealth: Payer: Self-pay | Admitting: Nurse Practitioner

## 2023-12-07 ENCOUNTER — Other Ambulatory Visit: Payer: Self-pay

## 2023-12-07 NOTE — Telephone Encounter (Signed)
 Called pt and clarify about medication and made him aware that it was send in yesterday. Pt stated that he has received his medication. Pt also made me aware that he is going to go to the hospital tomorrow due to fluid retention and will keep us  updated. Let pt know if he needs to see a provider just to call our office and schedule an appointment

## 2023-12-07 NOTE — Telephone Encounter (Signed)
 Copied from CRM [id number]. Topic: Clinical - Prescription Issue >> Dec 07, 2023 12:10 PM Geroldine GRADE wrote: Reason for CRM: Patient is calling because he is out of his torsemide  and the pharmacy told him we cancelled the prescription

## 2023-12-12 ENCOUNTER — Other Ambulatory Visit: Payer: Self-pay

## 2023-12-12 ENCOUNTER — Emergency Department (HOSPITAL_COMMUNITY)

## 2023-12-12 ENCOUNTER — Encounter (HOSPITAL_COMMUNITY): Payer: Self-pay | Admitting: Emergency Medicine

## 2023-12-12 ENCOUNTER — Telehealth (HOSPITAL_COMMUNITY): Payer: Self-pay | Admitting: *Deleted

## 2023-12-12 ENCOUNTER — Inpatient Hospital Stay (HOSPITAL_COMMUNITY)
Admission: EM | Admit: 2023-12-12 | Discharge: 2023-12-16 | DRG: 291 | Disposition: A | Discharge status: Home / Self Care | Attending: Internal Medicine | Admitting: Internal Medicine

## 2023-12-12 ENCOUNTER — Encounter (HOSPITAL_COMMUNITY)
Admission: RE | Admit: 2023-12-12 | Discharge: 2023-12-12 | Disposition: A | Discharge status: Home / Self Care | Source: Ambulatory Visit | Attending: Pulmonary Disease | Admitting: Pulmonary Disease

## 2023-12-12 DIAGNOSIS — Z6832 Body mass index (BMI) 32.0-32.9, adult: Secondary | ICD-10-CM

## 2023-12-12 DIAGNOSIS — F17210 Nicotine dependence, cigarettes, uncomplicated: Secondary | ICD-10-CM | POA: Diagnosis present

## 2023-12-12 DIAGNOSIS — N179 Acute kidney failure, unspecified: Secondary | ICD-10-CM | POA: Diagnosis present

## 2023-12-12 DIAGNOSIS — Z79899 Other long term (current) drug therapy: Secondary | ICD-10-CM

## 2023-12-12 DIAGNOSIS — D696 Thrombocytopenia, unspecified: Secondary | ICD-10-CM | POA: Diagnosis present

## 2023-12-12 DIAGNOSIS — J440 Chronic obstructive pulmonary disease with acute lower respiratory infection: Secondary | ICD-10-CM | POA: Diagnosis present

## 2023-12-12 DIAGNOSIS — F1020 Alcohol dependence, uncomplicated: Secondary | ICD-10-CM | POA: Diagnosis present

## 2023-12-12 DIAGNOSIS — J9601 Acute respiratory failure with hypoxia: Secondary | ICD-10-CM | POA: Diagnosis present

## 2023-12-12 DIAGNOSIS — Z86718 Personal history of other venous thrombosis and embolism: Secondary | ICD-10-CM

## 2023-12-12 DIAGNOSIS — E785 Hyperlipidemia, unspecified: Secondary | ICD-10-CM | POA: Diagnosis present

## 2023-12-12 DIAGNOSIS — E875 Hyperkalemia: Secondary | ICD-10-CM | POA: Diagnosis present

## 2023-12-12 DIAGNOSIS — R0902 Hypoxemia: Secondary | ICD-10-CM

## 2023-12-12 DIAGNOSIS — E1122 Type 2 diabetes mellitus with diabetic chronic kidney disease: Secondary | ICD-10-CM | POA: Diagnosis present

## 2023-12-12 DIAGNOSIS — Z885 Allergy status to narcotic agent status: Secondary | ICD-10-CM

## 2023-12-12 DIAGNOSIS — E119 Type 2 diabetes mellitus without complications: Secondary | ICD-10-CM | POA: Diagnosis not present

## 2023-12-12 DIAGNOSIS — E872 Acidosis, unspecified: Secondary | ICD-10-CM | POA: Diagnosis present

## 2023-12-12 DIAGNOSIS — N1831 Chronic kidney disease, stage 3a: Secondary | ICD-10-CM

## 2023-12-12 DIAGNOSIS — J168 Pneumonia due to other specified infectious organisms: Secondary | ICD-10-CM | POA: Diagnosis not present

## 2023-12-12 DIAGNOSIS — Z9049 Acquired absence of other specified parts of digestive tract: Secondary | ICD-10-CM

## 2023-12-12 DIAGNOSIS — Z888 Allergy status to other drugs, medicaments and biological substances status: Secondary | ICD-10-CM

## 2023-12-12 DIAGNOSIS — D6489 Other specified anemias: Secondary | ICD-10-CM | POA: Diagnosis present

## 2023-12-12 DIAGNOSIS — Z950 Presence of cardiac pacemaker: Secondary | ICD-10-CM

## 2023-12-12 DIAGNOSIS — Z932 Ileostomy status: Secondary | ICD-10-CM

## 2023-12-12 DIAGNOSIS — I442 Atrioventricular block, complete: Secondary | ICD-10-CM | POA: Diagnosis present

## 2023-12-12 DIAGNOSIS — F10139 Alcohol abuse with withdrawal, unspecified: Secondary | ICD-10-CM | POA: Diagnosis present

## 2023-12-12 DIAGNOSIS — I10 Essential (primary) hypertension: Secondary | ICD-10-CM | POA: Diagnosis not present

## 2023-12-12 DIAGNOSIS — Z7984 Long term (current) use of oral hypoglycemic drugs: Secondary | ICD-10-CM

## 2023-12-12 DIAGNOSIS — J90 Pleural effusion, not elsewhere classified: Secondary | ICD-10-CM | POA: Diagnosis not present

## 2023-12-12 DIAGNOSIS — D7589 Other specified diseases of blood and blood-forming organs: Secondary | ICD-10-CM | POA: Diagnosis present

## 2023-12-12 DIAGNOSIS — R918 Other nonspecific abnormal finding of lung field: Secondary | ICD-10-CM | POA: Diagnosis not present

## 2023-12-12 DIAGNOSIS — E669 Obesity, unspecified: Secondary | ICD-10-CM | POA: Diagnosis present

## 2023-12-12 DIAGNOSIS — G9341 Metabolic encephalopathy: Secondary | ICD-10-CM | POA: Diagnosis present

## 2023-12-12 DIAGNOSIS — M109 Gout, unspecified: Secondary | ICD-10-CM | POA: Diagnosis present

## 2023-12-12 DIAGNOSIS — K7460 Unspecified cirrhosis of liver: Secondary | ICD-10-CM | POA: Diagnosis present

## 2023-12-12 DIAGNOSIS — J441 Chronic obstructive pulmonary disease with (acute) exacerbation: Secondary | ICD-10-CM | POA: Diagnosis present

## 2023-12-12 DIAGNOSIS — J189 Pneumonia, unspecified organism: Principal | ICD-10-CM | POA: Diagnosis present

## 2023-12-12 DIAGNOSIS — I5033 Acute on chronic diastolic (congestive) heart failure: Secondary | ICD-10-CM | POA: Diagnosis present

## 2023-12-12 DIAGNOSIS — I517 Cardiomegaly: Secondary | ICD-10-CM | POA: Diagnosis not present

## 2023-12-12 DIAGNOSIS — J449 Chronic obstructive pulmonary disease, unspecified: Secondary | ICD-10-CM | POA: Diagnosis not present

## 2023-12-12 DIAGNOSIS — R0602 Shortness of breath: Secondary | ICD-10-CM | POA: Diagnosis not present

## 2023-12-12 DIAGNOSIS — E1142 Type 2 diabetes mellitus with diabetic polyneuropathy: Secondary | ICD-10-CM | POA: Diagnosis present

## 2023-12-12 DIAGNOSIS — Z7951 Long term (current) use of inhaled steroids: Secondary | ICD-10-CM

## 2023-12-12 DIAGNOSIS — J9602 Acute respiratory failure with hypercapnia: Secondary | ICD-10-CM | POA: Diagnosis present

## 2023-12-12 DIAGNOSIS — Z933 Colostomy status: Secondary | ICD-10-CM | POA: Diagnosis not present

## 2023-12-12 DIAGNOSIS — Z91199 Patient's noncompliance with other medical treatment and regimen due to unspecified reason: Secondary | ICD-10-CM

## 2023-12-12 DIAGNOSIS — Z8042 Family history of malignant neoplasm of prostate: Secondary | ICD-10-CM

## 2023-12-12 DIAGNOSIS — I509 Heart failure, unspecified: Secondary | ICD-10-CM

## 2023-12-12 DIAGNOSIS — R4182 Altered mental status, unspecified: Secondary | ICD-10-CM | POA: Diagnosis not present

## 2023-12-12 DIAGNOSIS — R569 Unspecified convulsions: Secondary | ICD-10-CM | POA: Diagnosis not present

## 2023-12-12 DIAGNOSIS — I110 Hypertensive heart disease with heart failure: Principal | ICD-10-CM | POA: Diagnosis present

## 2023-12-12 DIAGNOSIS — Z825 Family history of asthma and other chronic lower respiratory diseases: Secondary | ICD-10-CM

## 2023-12-12 LAB — COMPREHENSIVE METABOLIC PANEL WITH GFR
ALT: 30 U/L (ref 0–44)
AST: 45 U/L — ABNORMAL HIGH (ref 15–41)
Albumin: 4 g/dL (ref 3.5–5.0)
Alkaline Phosphatase: 114 U/L (ref 38–126)
Anion gap: 16 — ABNORMAL HIGH (ref 5–15)
BUN: 55 mg/dL — ABNORMAL HIGH (ref 8–23)
CO2: 24 mmol/L (ref 22–32)
Calcium: 9.8 mg/dL (ref 8.9–10.3)
Chloride: 93 mmol/L — ABNORMAL LOW (ref 98–111)
Creatinine, Ser: 1.89 mg/dL — ABNORMAL HIGH (ref 0.61–1.24)
GFR, Estimated: 39 mL/min — ABNORMAL LOW (ref >60)
Glucose, Bld: 122 mg/dL — ABNORMAL HIGH (ref 70–99)
Potassium: 4.9 mmol/L (ref 3.5–5.1)
Sodium: 133 mmol/L — ABNORMAL LOW (ref 135–145)
Total Bilirubin: 1.2 mg/dL (ref 0.0–1.2)
Total Protein: 8 g/dL (ref 6.5–8.1)

## 2023-12-12 LAB — CBC WITH DIFFERENTIAL/PLATELET
Abs Immature Granulocytes: 0.06 10*3/uL (ref 0.00–0.07)
Basophils Absolute: 0.1 10*3/uL (ref 0.0–0.1)
Basophils Relative: 1 %
Eosinophils Absolute: 0.2 10*3/uL (ref 0.0–0.5)
Eosinophils Relative: 2 %
HCT: 40.8 % (ref 39.0–52.0)
Hemoglobin: 13.3 g/dL (ref 13.0–17.0)
Immature Granulocytes: 1 %
Lymphocytes Relative: 12 %
Lymphs Abs: 1.3 10*3/uL (ref 0.7–4.0)
MCH: 35.2 pg — ABNORMAL HIGH (ref 26.0–34.0)
MCHC: 32.6 g/dL (ref 30.0–36.0)
MCV: 107.9 fL — ABNORMAL HIGH (ref 80.0–100.0)
Monocytes Absolute: 1.2 10*3/uL — ABNORMAL HIGH (ref 0.1–1.0)
Monocytes Relative: 11 %
Neutro Abs: 7.8 10*3/uL — ABNORMAL HIGH (ref 1.7–7.7)
Neutrophils Relative %: 73 %
Platelets: 208 10*3/uL (ref 150–400)
RBC: 3.78 MIL/uL — ABNORMAL LOW (ref 4.22–5.81)
RDW: 17.8 % — ABNORMAL HIGH (ref 11.5–15.5)
WBC: 10.5 10*3/uL (ref 4.0–10.5)
nRBC: 0.4 % — ABNORMAL HIGH (ref 0.0–0.2)

## 2023-12-12 LAB — TROPONIN I (HIGH SENSITIVITY)
Troponin I (High Sensitivity): 39 ng/L — ABNORMAL HIGH (ref <18)
Troponin I (High Sensitivity): 38 ng/L — ABNORMAL HIGH (ref <18)

## 2023-12-12 LAB — BRAIN NATRIURETIC PEPTIDE: B Natriuretic Peptide: 238.9 pg/mL — ABNORMAL HIGH (ref 0.0–100.0)

## 2023-12-12 MED ORDER — GABAPENTIN 300 MG PO CAPS
600.0000 mg | ORAL_CAPSULE | Freq: Every day | ORAL | Status: DC
  Administered 2023-12-13: 600 mg via ORAL
  Filled 2023-12-12: qty 2

## 2023-12-12 MED ORDER — HEPARIN SODIUM (PORCINE) 5000 UNIT/ML IJ SOLN
5000.0000 [IU] | Freq: Three times a day (TID) | INTRAMUSCULAR | Status: DC
  Administered 2023-12-13 – 2023-12-16 (×10): 5000 [IU] via SUBCUTANEOUS
  Filled 2023-12-12 (×10): qty 1

## 2023-12-12 MED ORDER — THIAMINE HCL 100 MG/ML IJ SOLN
100.0000 mg | Freq: Every day | INTRAMUSCULAR | Status: DC
  Administered 2023-12-13 – 2023-12-16 (×4): 100 mg via INTRAVENOUS
  Filled 2023-12-12 (×4): qty 2

## 2023-12-12 MED ORDER — INSULIN ASPART 100 UNIT/ML IJ SOLN: 0.0000 [IU] | Freq: Every day | INTRAMUSCULAR | Status: DC

## 2023-12-12 MED ORDER — INSULIN ASPART 100 UNIT/ML IJ SOLN
0.0000 [IU] | Freq: Three times a day (TID) | INTRAMUSCULAR | Status: DC
  Administered 2023-12-13: 1 [IU] via SUBCUTANEOUS

## 2023-12-12 MED ORDER — LORAZEPAM 2 MG/ML IJ SOLN: 1.0000 mg | INTRAMUSCULAR | Status: DC | PRN

## 2023-12-12 MED ORDER — GABAPENTIN 300 MG PO CAPS: 300.0000 mg | ORAL_CAPSULE | Freq: Every morning | ORAL | Status: DC

## 2023-12-12 MED ORDER — IPRATROPIUM-ALBUTEROL 0.5-2.5 (3) MG/3ML IN SOLN
3.0000 mL | Freq: Four times a day (QID) | RESPIRATORY_TRACT | Status: DC | PRN
  Filled 2023-12-12: qty 3

## 2023-12-12 MED ORDER — ADULT MULTIVITAMIN W/MINERALS CH
1.0000 | ORAL_TABLET | Freq: Every day | ORAL | Status: DC
  Filled 2023-12-12: qty 1

## 2023-12-12 MED ORDER — DOXYCYCLINE HYCLATE 100 MG PO TABS
100.0000 mg | ORAL_TABLET | Freq: Once | ORAL | Status: AC
  Administered 2023-12-12: 100 mg via ORAL
  Filled 2023-12-12: qty 1

## 2023-12-12 MED ORDER — SENNA 8.6 MG PO TABS: 1.0000 | ORAL_TABLET | Freq: Every day | ORAL | Status: DC | PRN

## 2023-12-12 MED ORDER — FOLIC ACID 1 MG PO TABS
1.0000 mg | ORAL_TABLET | Freq: Every day | ORAL | Status: DC
  Filled 2023-12-12: qty 1

## 2023-12-12 MED ORDER — ACETAMINOPHEN 325 MG PO TABS
650.0000 mg | ORAL_TABLET | Freq: Four times a day (QID) | ORAL | Status: DC | PRN
  Administered 2023-12-13 – 2023-12-15 (×3): 650 mg via ORAL
  Filled 2023-12-12 (×3): qty 2

## 2023-12-12 MED ORDER — HYDROXYZINE HCL 25 MG PO TABS: 50.0000 mg | ORAL_TABLET | Freq: Every evening | ORAL | Status: DC | PRN

## 2023-12-12 MED ORDER — LORAZEPAM 1 MG PO TABS: 1.0000 mg | ORAL_TABLET | ORAL | Status: DC | PRN

## 2023-12-12 MED ORDER — SODIUM CHLORIDE 0.9% FLUSH
3.0000 mL | Freq: Two times a day (BID) | INTRAVENOUS | Status: DC
  Administered 2023-12-13 – 2023-12-16 (×8): 3 mL via INTRAVENOUS

## 2023-12-12 MED ORDER — OXYCODONE HCL 5 MG PO TABS: 5.0000 mg | ORAL_TABLET | ORAL | Status: DC | PRN

## 2023-12-12 MED ORDER — FENTANYL CITRATE PF 50 MCG/ML IJ SOSY: 12.5000 ug | PREFILLED_SYRINGE | INTRAMUSCULAR | Status: DC | PRN

## 2023-12-12 MED ORDER — LORAZEPAM 1 MG PO TABS: 0.0000 mg | ORAL_TABLET | Freq: Two times a day (BID) | ORAL | Status: DC

## 2023-12-12 MED ORDER — LORAZEPAM 1 MG PO TABS
0.0000 mg | ORAL_TABLET | Freq: Four times a day (QID) | ORAL | Status: DC
  Administered 2023-12-13: 1 mg via ORAL
  Filled 2023-12-12 (×2): qty 1

## 2023-12-12 MED ORDER — FUROSEMIDE 10 MG/ML IJ SOLN
40.0000 mg | Freq: Once | INTRAMUSCULAR | Status: AC
  Administered 2023-12-12: 40 mg via INTRAVENOUS
  Filled 2023-12-12: qty 4

## 2023-12-12 MED ORDER — FUROSEMIDE 10 MG/ML IJ SOLN
40.0000 mg | Freq: Two times a day (BID) | INTRAMUSCULAR | Status: DC
  Administered 2023-12-13: 40 mg via INTRAVENOUS
  Filled 2023-12-12: qty 4

## 2023-12-12 MED ORDER — METOPROLOL SUCCINATE ER 25 MG PO TB24
25.0000 mg | ORAL_TABLET | Freq: Every day | ORAL | Status: DC
  Filled 2023-12-12: qty 1

## 2023-12-12 MED ORDER — SODIUM CHLORIDE 0.9 % IV SOLN
2.0000 g | Freq: Once | INTRAVENOUS | Status: AC
  Administered 2023-12-12: 2 g via INTRAVENOUS
  Filled 2023-12-12: qty 20

## 2023-12-12 MED ORDER — ACETAMINOPHEN 650 MG RE SUPP: 650.0000 mg | Freq: Four times a day (QID) | RECTAL | Status: DC | PRN

## 2023-12-12 MED ORDER — THIAMINE MONONITRATE 100 MG PO TABS: 100.0000 mg | ORAL_TABLET | Freq: Every day | ORAL | Status: DC

## 2023-12-12 NOTE — Addendum Note (Signed)
 Encounter addended by: Midge Cloyd POUR on: 12/12/2023 4:16 PM  Actions taken: Flowsheet data copied forward, Flowsheet accepted

## 2023-12-12 NOTE — H&P (Signed)
 History and Physical    Kanen Mottola FMW:[id number] DOB: 1960-11-08 DOA: 12/12/2023  PCP: Elnor [name], NP   Patient coming from: Home   Chief Complaint: Increased SOB and swelling   HPI: Luis [name] is a 63 y.o. male with medical history significant for hypertension, type 2 diabetes mellitus, history of DVT no longer anticoagulated, COPD, chronic HFpEF, complete heart block with pacemaker, alcohol abuse, and diverticulitis status post colectomy complicated by wound infections who now presents with worsening shortness of breath and swelling.  Patient reports that he has been experiencing progressive lower extremity edema for several days and has had worsening shortness of breath over the same interval.  He has a chronic cough with scant sputum production which he states has not changed recently.  He denies fever or chills.  ED Course: Upon arrival to the ED, patient is found to be afebrile and saturating low 90s on room air with mild tachypnea, increased work of breathing, normal HR, and stable BP.  Labs are most notable for BUN 56, creatinine 1.89, normal WBC, troponin 39, and BNP 239.  Chest x-ray is notable for interstitial edema versus atypical infection and patchy airspace opacities in the bases.  He was treated in the ED with Rocephin , doxycycline , and IV Lasix .  Review of Systems:  All other systems reviewed and apart from HPI, are negative.  Past Medical History:  Diagnosis Date   Acute respiratory failure with hypoxia (HCC) 12/23/2022   Alcohol withdrawal syndrome, with delirium (HCC) 12/27/2022   COPD (chronic obstructive pulmonary disease) (HCC)    Diabetes mellitus without complication (HCC)    Diverticulitis    DVT (deep venous thrombosis) (HCC)    x3   Encephalopathy acute 12/28/2022   ETOH abuse    H/O colectomy    Hyperlipidemia    Hypertension    Sepsis associated hypotension (HCC) 08/03/2019   Smoker     Past Surgical History:  Procedure Laterality Date    COLOSTOMY     PACEMAKER LEADLESS INSERTION N/A 12/31/2020   Procedure: PACEMAKER LEADLESS INSERTION;  Surgeon: Cindie Ole DASEN, MD;  Location: [facility];  Service: Cardiovascular;  Laterality: N/A;   TONSILLECTOMY      Social History:   reports that he has been smoking cigarettes. He started smoking about 48 years ago. He has a 36.4 pack-year smoking history. He uses smokeless tobacco. He reports current alcohol use of about 4.0 standard drinks of alcohol per week. He reports current drug use. Drug: Marijuana.  Allergies  Allergen Reactions   Lisinopril     Other reaction(s): renal effects   Codeine Other (See Comments) and Hives    unknown Other reaction(s): Other (See Comments) unknown unknown    Family History  Problem Relation Age of Onset   COPD Mother    Prostate cancer Father    Colon cancer Neg Hx    Rectal cancer Neg Hx    Stomach cancer Neg Hx    Esophageal cancer Neg Hx      Prior to Admission medications   Medication Sig Start Date End Date Taking? Authorizing Provider  gabapentin  (NEURONTIN ) 300 MG capsule Take 2 capsules (600 mg total) by mouth 2 (two) times daily. 11/23/23   Sikora, Rebecca, DPM  Accu-Chek Softclix Lancets lancets Use to test blood sugar once daily 10/20/23 01/21/24  Elnor [name], NP  acetaminophen  (ACETAMINOPHEN  8 HOUR) 650 MG CR tablet Take 1 tablet (650 mg total) by mouth every 8 (eight) hours as needed for  pain Patient taking differently: Take 1,300 mg by mouth every 8 (eight) hours as needed for pain or fever. 11/10/22     amLODipine  (NORVASC ) 10 MG tablet Take 1 tablet (10 mg total) by mouth daily. 11/29/23   Webb, Padonda B, FNP  ascorbic acid  (VITAMIN C ) 500 MG tablet Take 1 tablet (500 mg total) by mouth daily. 01/01/23     atorvastatin  (LIPITOR) 10 MG tablet Take 1 tablet (10 mg total) by mouth daily. 09/05/23   Elnor [name], NP  Blood Glucose Monitoring Suppl (ACCU-CHEK GUIDE) w/Device KIT Use to test blood sugar once daily  10/20/23   Elnor [name], NP  cetirizine  (ZYRTEC ) 10 MG tablet Take 1 tablet (10 mg total) by mouth daily. 06/28/23   Elnor [name], NP  dapagliflozin  propanediol (FARXIGA ) 10 MG TABS tablet Take 1 tablet (10 mg total) by mouth daily before breakfast. 03/10/23   Elnor [name], NP  folic acid  (FOLVITE ) 1 MG tablet Take 1 tablet (1 mg total) by mouth daily. 11/23/23   Webb, Padonda B, FNP  gabapentin  (NEURONTIN ) 300 MG capsule Take 1 capsule (300 mg total) by mouth 2 (two) times daily. Patient taking differently: Take 600 mg by mouth at bedtime. 06/20/23   Joya Stabs, DPM  glucose blood (ACCU-CHEK GUIDE TEST) test strip Use to test blood sugar once daily 10/20/23 01/31/24  Elnor [name], NP  guaiFENesin  (MUCINEX ) 600 MG 12 hr tablet Take 1 tablet (600 mg total) by mouth 2 (two) times daily as needed to loosen phlegm or cough. 10/11/23   Elnor [name], NP  hydrOXYzine  (VISTARIL ) 50 MG capsule Take 1 capsule (50 mg total) by mouth at bedtime as needed. 09/22/23   Gray, Sarah E, NP  ipratropium-albuterol  (DUONEB) 0.5-2.5 (3) MG/3ML SOLN Take 3 mLs by nebulization every 6 (six) hours as needed.    [provider]  isosorbide  mononitrate (IMDUR ) 30 MG 24 hr tablet Take 1 tablet (30 mg total) by mouth daily. 08/14/23   Dennise Lavada POUR, MD  losartan  (COZAAR ) 25 MG tablet Take 1 tablet (25 mg total) by mouth daily. 03/29/23   Sabharwal, Aditya, DO  metFORMIN  (GLUCOPHAGE ) 500 MG tablet Take 2 tablets (1,000 mg total) by mouth daily with breakfast AND 1 tablet (500 mg total) daily with supper. 05/04/23   Elnor [name], NP  metoprolol  succinate (TOPROL -XL) 25 MG 24 hr tablet Take 1 tablet (25 mg total) by mouth daily. 03/29/23   Sabharwal, Aditya, DO  nicotine  (NICODERM CQ  - DOSED IN MG/24 HOURS) 21 mg/24hr patch Place 1 patch (21 mg total) onto the skin daily. 08/14/23   Singh, Prashant K, MD  thiamine  (VITAMIN B1) 100 MG tablet Take 1 tablet (100 mg total) by mouth daily. 11/10/22     Tiotropium  Bromide-Olodaterol 2.5-2.5 MCG/ACT AERS Inhale 2 puffs into the lungs daily. 10/03/23   Hope Almarie ORN, NP  torsemide  (DEMADEX ) 20 MG tablet Take 4 tablets (80 mg total) by mouth daily. 12/06/23   Webb, Padonda B, FNP  torsemide  (DEMADEX ) 20 MG tablet Take 2 tablets (40 mg total) by mouth daily. 12/06/23   Webb, Padonda B, FNP  triamcinolone  cream (KENALOG ) 0.5 % Apply topically to legs twice daily for up to 2 weeks as needed for eczema 08/23/23   Elnor [name], NP  icosapent  Ethyl (VASCEPA ) 1 g capsule Take 2 capsules (2 g total) by mouth 2 (two) times daily. 03/09/23 03/15/23  Elnor [name], NP    Physical Exam: Vitals:  12/12/23 2100 12/12/23 2130 12/12/23 2145 12/12/23 2200  BP: (!) 144/84   (!) 156/85  Pulse: 89 98 89 97  Resp:   17 (!) 23  Temp:      SpO2: 91%  90%   Weight:        Constitutional: labored respirations, no diaphoresis   Eyes: PERTLA, lids and conjunctivae normal ENMT: Mucous membranes are moist. Posterior pharynx clear of any exudate or lesions.   Neck: supple, no masses  Respiratory: Labored breathing, dyspneic with speech. Fine rales bilaterally.  Cardiovascular: S1 & S2 heard, regular rate and rhythm. Pretibial pitting edema.  Abdomen: Distended, soft. Bowel sounds active.  Musculoskeletal: no clubbing / cyanosis. No joint deformity upper and lower extremities.   Skin: abdominal wounds. Warm, dry, well-perfused. Neurologic: CN 2-12 grossly intact. Moving all extremities. Alert and oriented.  Psychiatric: Restless. Cooperative.    Labs and Imaging on Admission: I have personally reviewed following labs and imaging studies  CBC: Recent Labs  Lab 12/12/23 1600  WBC 10.5  NEUTROABS 7.8*  HGB 13.3  HCT 40.8  MCV 107.9*  PLT 208   Basic Metabolic Panel: Recent Labs  Lab 12/12/23 1600  NA 133*  K 4.9  CL 93*  CO2 24  GLUCOSE 122*  BUN 55*  CREATININE 1.89*  CALCIUM  9.8   GFR: Estimated Creatinine Clearance: 51.1 mL/min (A) (by C-G formula  based on SCr of 1.89 mg/dL (H)). Liver Function Tests: Recent Labs  Lab 12/12/23 1600  AST 45*  ALT 30  ALKPHOS 114  BILITOT 1.2  PROT 8.0  ALBUMIN 4.0   No results for input(s): LIPASE, AMYLASE in the last 168 hours. No results for input(s): AMMONIA in the last 168 hours. Coagulation Profile: No results for input(s): INR, PROTIME in the last 168 hours. Cardiac Enzymes: No results for input(s): CKTOTAL, CKMB, CKMBINDEX, TROPONINI in the last 168 hours. BNP (last 3 results) No results for input(s): PROBNP in the last 8760 hours. HbA1C: No results for input(s): HGBA1C in the last 72 hours. CBG: No results for input(s): GLUCAP in the last 168 hours. Lipid Profile: No results for input(s): CHOL, HDL, LDLCALC, TRIG, CHOLHDL, LDLDIRECT in the last 72 hours. Thyroid  Function Tests: No results for input(s): TSH, T4TOTAL, FREET4, T3FREE, THYROIDAB in the last 72 hours. Anemia Panel: No results for input(s): VITAMINB12, FOLATE, FERRITIN, TIBC, IRON, RETICCTPCT in the last 72 hours. Urine analysis:    Component Value Date/Time   COLORURINE YELLOW 10/05/2023 1201   APPEARANCEUR CLEAR 10/05/2023 1201   LABSPEC 1.010 10/05/2023 1201   PHURINE 6.0 10/05/2023 1201   GLUCOSEU 100 (A) 10/05/2023 1201   HGBUR NEGATIVE 10/05/2023 1201   BILIRUBINUR NEGATIVE 10/05/2023 1201   KETONESUR NEGATIVE 10/05/2023 1201   PROTEINUR 30 (A) 08/07/2023 0318   UROBILINOGEN 0.2 10/05/2023 1201   NITRITE NEGATIVE 10/05/2023 1201   LEUKOCYTESUR NEGATIVE 10/05/2023 1201   Sepsis Labs: @LABRCNTIP (procalcitonin:4,lacticidven:4) )No results found for this or any previous visit (from the past 240 hours).   Radiological Exams on Admission: DG Chest 2 View Result Date: 12/12/2023 CLINICAL DATA:  SOB EXAM: CHEST - 2 VIEW COMPARISON:  August 24, 2023 FINDINGS: Bilateral perihilar interstitial opacities. Patchy airspace opacities in both lung bases. Trace  right pleural effusion. No pneumothorax. Mild cardiomegaly. Intraventricular pacemaker device. Aortic atherosclerosis. No acute fracture or destructive lesions. Multilevel thoracic osteophytosis. IMPRESSION: 1. Mild cardiomegaly with findings of either interstitial edema or atypical/viral infection. Trace right pleural effusion. 2. Patchy airspace opacities in both lung bases, possibly  atelectasis, edema, or superimposed pneumonia. Electronically Signed   By: Rogelia Myers M.D.   On: 12/12/2023 17:08    EKG: Independently reviewed. Paced.   Assessment/Plan   1. Acute on chronic diastolic CHF  - EF was 50-55% with grade 2 diastolic dysfunction on TTE from July 2024  - Continue diuresis with IV Lasix , monitor weight and I/Os, monitor renal function and electrolytes, check procalcitonin, hold off on further antibiotics for now    2. AKI  - SCr is 1.89; baseline appears to be closer to 1.2-1.5  - Hold losartan , Farxiga , and metformin , renally-dose medications, monitor closely while diuresing   3. COPD  - Not in exacerbation  - DuoNebs as-needed    4. Type II DM  - Check CBGs and use low-intensity SSI for now   5. Alcoholism  - Monitor for withdrawal, use Ativan  if needed, supplement vitamins    DVT prophylaxis: sq heparin   Code Status: Full  Level of Care: Level of care: Telemetry Cardiac Family Communication: None present  Disposition Plan:  Patient is from: home  Anticipated d/c is to: TBD Anticipated d/c date is: 12/15/23  Patient currently: Pending improved respiratory status, stable renal functio  Consults called: None Admission status: Inpatient     Evalene GORMAN Sprinkles, MD [facility]  12/12/2023, 11:51 PM

## 2023-12-12 NOTE — ED Provider Triage Note (Signed)
 Emergency Medicine Provider Triage Evaluation Note  Luis [name] , a 63 y.o. male  was evaluated in triage.  Pt complains of shortness of breath x 2 weeks.  Feels like he is retaining fluid. Has had multiple admissions in the past few months for hypoxic/hypercarbic respiratory failure in the setting of pneumonia and COPD and CHF exacerbation. Review of Systems  Positive:  Negative:   Physical Exam  BP (!) 147/66   Pulse 86   Temp 98.7 F (37.1 C)   Resp 20   SpO2 90%  Gen:   Awake, no distress   Resp:  Increased respiratory effort  MSK:   Moves extremities without difficulty, bilateral 2+ pitting edema Other:  Abdominal surgical dressing in place with surrounding erythema, no significant increased warmth, reported to be at baseline  Medical Decision Making  Medically screening exam initiated at 3:48 PM.  Appropriate orders placed.  Luis [name] was informed that the remainder of the evaluation will be completed by another provider, this initial triage assessment does not replace that evaluation, and the importance of remaining in the ED until their evaluation is complete.  Cardiac and respiratory workup, not suitable for waiting room   Luis [name] DEL, PA-C 12/12/23 1609

## 2023-12-12 NOTE — Telephone Encounter (Signed)
 Pt has not come to [facility] since 11/16/23 nor has he returned our phone calls. Called again and LVM that pt will be discharged from the program. Aliene Aris BS, ACSM-CEP 12/12/2023 3:40 PM

## 2023-12-12 NOTE — ED Provider Notes (Signed)
 [place] EMERGENCY DEPARTMENT AT [facility] Provider Note   CSN: [id number] Arrival date & time: 12/12/23  1433     Patient presents with: Shortness of Breath   Luis [name] is a 63 y.o. male.   63 year old male with past medical history of morbid obesity as well as CHF and COPD presenting to the emergency department today with shortness of breath.  The patient states that he has been having worsening shortness of breath as well as some leg swelling over the past few days.  He states that he has been coughing up some white phlegm with this.  Denies any fevers.  He states that he has tried his albuterol  which did not seem to be helping so he has not really been using this much.  Denies any wheezing.  He came to the ER today for further evaluation regarding this.  The patient denies any chest pain currently.  Denies a history of DVT or pulmonary embolism, recent surgeries, recent travel.  Denies any hemoptysis.   Shortness of Breath Associated symptoms: cough        Prior to Admission medications   Medication Sig Start Date End Date Taking? Authorizing Provider  gabapentin  (NEURONTIN ) 300 MG capsule Take 2 capsules (600 mg total) by mouth 2 (two) times daily. 11/23/23   Sikora, Rebecca, DPM  Accu-Chek Softclix Lancets lancets Use to test blood sugar once daily 10/20/23 01/21/24  Elnor [name], NP  acetaminophen  (ACETAMINOPHEN  8 HOUR) 650 MG CR tablet Take 1 tablet (650 mg total) by mouth every 8 (eight) hours as needed for pain Patient taking differently: Take 1,300 mg by mouth every 8 (eight) hours as needed for pain or fever. 11/10/22     amLODipine  (NORVASC ) 10 MG tablet Take 1 tablet (10 mg total) by mouth daily. 11/29/23   Webb, Padonda B, FNP  ascorbic acid  (VITAMIN C ) 500 MG tablet Take 1 tablet (500 mg total) by mouth daily. 01/01/23     atorvastatin  (LIPITOR) 10 MG tablet Take 1 tablet (10 mg total) by mouth daily. 09/05/23   Elnor [name], NP  Blood Glucose Monitoring  Suppl (ACCU-CHEK GUIDE) w/Device KIT Use to test blood sugar once daily 10/20/23   Elnor [name], NP  cetirizine  (ZYRTEC ) 10 MG tablet Take 1 tablet (10 mg total) by mouth daily. 06/28/23   Elnor [name], NP  dapagliflozin  propanediol (FARXIGA ) 10 MG TABS tablet Take 1 tablet (10 mg total) by mouth daily before breakfast. 03/10/23   Elnor [name], NP  folic acid  (FOLVITE ) 1 MG tablet Take 1 tablet (1 mg total) by mouth daily. 11/23/23   Webb, Padonda B, FNP  gabapentin  (NEURONTIN ) 300 MG capsule Take 1 capsule (300 mg total) by mouth 2 (two) times daily. Patient taking differently: Take 600 mg by mouth at bedtime. 06/20/23   Joya Stabs, DPM  glucose blood (ACCU-CHEK GUIDE TEST) test strip Use to test blood sugar once daily 10/20/23 01/31/24  Elnor [name], NP  guaiFENesin  (MUCINEX ) 600 MG 12 hr tablet Take 1 tablet (600 mg total) by mouth 2 (two) times daily as needed to loosen phlegm or cough. 10/11/23   Elnor [name], NP  hydrOXYzine  (VISTARIL ) 50 MG capsule Take 1 capsule (50 mg total) by mouth at bedtime as needed. 09/22/23   Gray, Sarah E, NP  ipratropium-albuterol  (DUONEB) 0.5-2.5 (3) MG/3ML SOLN Take 3 mLs by nebulization every 6 (six) hours as needed.    [provider]  isosorbide  mononitrate (IMDUR ) 30 MG  24 hr tablet Take 1 tablet (30 mg total) by mouth daily. 08/14/23   Singh, Prashant K, MD  losartan  (COZAAR ) 25 MG tablet Take 1 tablet (25 mg total) by mouth daily. 03/29/23   Sabharwal, Aditya, DO  metFORMIN  (GLUCOPHAGE ) 500 MG tablet Take 2 tablets (1,000 mg total) by mouth daily with breakfast AND 1 tablet (500 mg total) daily with supper. 05/04/23   Elnor [name], NP  metoprolol  succinate (TOPROL -XL) 25 MG 24 hr tablet Take 1 tablet (25 mg total) by mouth daily. 03/29/23   Sabharwal, Aditya, DO  nicotine  (NICODERM CQ  - DOSED IN MG/24 HOURS) 21 mg/24hr patch Place 1 patch (21 mg total) onto the skin daily. 08/14/23   Singh, Prashant K, MD  thiamine  (VITAMIN B1) 100 MG tablet Take 1 tablet  (100 mg total) by mouth daily. 11/10/22     Tiotropium Bromide -Olodaterol 2.5-2.5 MCG/ACT AERS Inhale 2 puffs into the lungs daily. 10/03/23   Hope Almarie ORN, NP  torsemide  (DEMADEX ) 20 MG tablet Take 4 tablets (80 mg total) by mouth daily. 12/06/23   Webb, Padonda B, FNP  torsemide  (DEMADEX ) 20 MG tablet Take 2 tablets (40 mg total) by mouth daily. 12/06/23   Webb, Padonda B, FNP  triamcinolone  cream (KENALOG ) 0.5 % Apply topically to legs twice daily for up to 2 weeks as needed for eczema 08/23/23   Elnor [name], NP  icosapent  Ethyl (VASCEPA ) 1 g capsule Take 2 capsules (2 g total) by mouth 2 (two) times daily. 03/09/23 03/15/23  Elnor [name], NP    Allergies: Lisinopril and Codeine    Review of Systems  Respiratory:  Positive for cough and shortness of breath.   All other systems reviewed and are negative.   Updated Vital Signs BP (!) 144/84   Pulse 89   Temp 98.7 F (37.1 C)   Resp 14   Wt 109 kg   SpO2 91%   BMI 31.70 kg/m   Physical Exam Vitals and nursing note reviewed.   Gen: Conversational dyspnea noted Eyes: PERRL, EOMI HEENT: no oropharyngeal swelling Neck: trachea midline Resp: Diminished at bilateral lung bases with faint wheezes noted Card: RRR, no murmurs, rubs, or gallops Abd: nontender, nondistended Extremities: 1+ pitting edema bilaterally Vascular: 2+ radial pulses bilaterally, 2+ DP pulses bilaterally Skin: no rashes Psyc: acting appropriately   (all labs ordered are listed, but only abnormal results are displayed) Labs Reviewed  CBC WITH DIFFERENTIAL/PLATELET - Abnormal; Notable for the following components:      Result Value   RBC 3.78 (*)    MCV 107.9 (*)    MCH 35.2 (*)    RDW 17.8 (*)    nRBC 0.4 (*)    Neutro Abs 7.8 (*)    Monocytes Absolute 1.2 (*)    All other components within normal limits  BRAIN NATRIURETIC PEPTIDE - Abnormal; Notable for the following components:   B Natriuretic Peptide 238.9 (*)    All other components within  normal limits  COMPREHENSIVE METABOLIC PANEL WITH GFR - Abnormal; Notable for the following components:   Sodium 133 (*)    Chloride 93 (*)    Glucose, Bld 122 (*)    BUN 55 (*)    Creatinine, Ser 1.89 (*)    AST 45 (*)    GFR, Estimated 39 (*)    Anion gap 16 (*)    All other components within normal limits  TROPONIN I (HIGH SENSITIVITY) - Abnormal; Notable for the following components:   Troponin  I (High Sensitivity) 39 (*)    All other components within normal limits  TROPONIN I (HIGH SENSITIVITY) - Abnormal; Notable for the following components:   Troponin I (High Sensitivity) 38 (*)    All other components within normal limits    EKG: None  Radiology: DG Chest 2 View Result Date: 12/12/2023 CLINICAL DATA:  SOB EXAM: CHEST - 2 VIEW COMPARISON:  August 24, 2023 FINDINGS: Bilateral perihilar interstitial opacities. Patchy airspace opacities in both lung bases. Trace right pleural effusion. No pneumothorax. Mild cardiomegaly. Intraventricular pacemaker device. Aortic atherosclerosis. No acute fracture or destructive lesions. Multilevel thoracic osteophytosis. IMPRESSION: 1. Mild cardiomegaly with findings of either interstitial edema or atypical/viral infection. Trace right pleural effusion. 2. Patchy airspace opacities in both lung bases, possibly atelectasis, edema, or superimposed pneumonia. Electronically Signed   By: Rogelia Myers M.D.   On: 12/12/2023 17:08     Procedures   Medications Ordered in the ED  cefTRIAXone  (ROCEPHIN ) 2 g in sodium chloride  0.9 % 100 mL IVPB (2 g Intravenous New Bag/Given 12/12/23 2135)  furosemide  (LASIX ) injection 40 mg (40 mg Intravenous Given 12/12/23 2134)  doxycycline  (VIBRA -TABS) tablet 100 mg (100 mg Oral Given 12/12/23 2134)                                    Medical Decision Making 63 year old male with past medical history of morbid obesity, CHF, and COPD presenting to the emergency department today with cough and shortness of breath.  I  will further evaluate the patient here with basic labs well as an EKG, chest x-ray, troponin, and BNP to evaluate for pulmonary edema, pulmonary infiltrates, or pneumothorax.  Will also give him a DuoNeb here to see if this helps with his breathing given his history of COPD and continued tobacco abuse.  I will reevaluate for ultimate disposition.  The patient's x-ray shows pulmonary edema versus pneumonia.  He has reporting coughing up some white sputum.  The patient is covered with Rocephin  and doxycycline .  His pulse ox is around 91%.  He is also given a dose of Lasix  here.  We did try to ambulate the patient.  His pulse ox dropped to the low 80s.  A call was placed to the hospitalist service for admission.  Risk Prescription drug management.        Final diagnoses:  Pneumonia due to infectious organism, unspecified laterality, unspecified part of lung  Acute on chronic congestive heart failure, unspecified heart failure type [facility])  Hypoxia    ED Discharge Orders     None          Ula Prentice SAUNDERS, MD 12/12/23 2142

## 2023-12-12 NOTE — ED Triage Notes (Signed)
 PT states he has been retaining fluid and having SOB x 2 weeks.  Denies pain.

## 2023-12-12 NOTE — ED Notes (Signed)
 Patient took all cardiac monitoring off. Patient c/o of bp cuff and o2 sensor hurting and doesn't want to wear. Continues to question if he's getting a room or not.

## 2023-12-12 NOTE — ED Notes (Signed)
 Please update Susanna (sister) [phone] or [phone]

## 2023-12-13 ENCOUNTER — Inpatient Hospital Stay (HOSPITAL_COMMUNITY)

## 2023-12-13 ENCOUNTER — Encounter (HOSPITAL_COMMUNITY): Payer: Self-pay | Admitting: Family Medicine

## 2023-12-13 DIAGNOSIS — N179 Acute kidney failure, unspecified: Secondary | ICD-10-CM | POA: Diagnosis not present

## 2023-12-13 DIAGNOSIS — I10 Essential (primary) hypertension: Secondary | ICD-10-CM | POA: Diagnosis not present

## 2023-12-13 DIAGNOSIS — J9601 Acute respiratory failure with hypoxia: Secondary | ICD-10-CM | POA: Diagnosis not present

## 2023-12-13 DIAGNOSIS — J9602 Acute respiratory failure with hypercapnia: Secondary | ICD-10-CM

## 2023-12-13 DIAGNOSIS — R4182 Altered mental status, unspecified: Secondary | ICD-10-CM

## 2023-12-13 DIAGNOSIS — E119 Type 2 diabetes mellitus without complications: Secondary | ICD-10-CM

## 2023-12-13 DIAGNOSIS — R569 Unspecified convulsions: Secondary | ICD-10-CM | POA: Diagnosis not present

## 2023-12-13 DIAGNOSIS — F17210 Nicotine dependence, cigarettes, uncomplicated: Secondary | ICD-10-CM | POA: Diagnosis not present

## 2023-12-13 DIAGNOSIS — J441 Chronic obstructive pulmonary disease with (acute) exacerbation: Secondary | ICD-10-CM | POA: Diagnosis not present

## 2023-12-13 DIAGNOSIS — G9341 Metabolic encephalopathy: Secondary | ICD-10-CM | POA: Diagnosis not present

## 2023-12-13 DIAGNOSIS — I5033 Acute on chronic diastolic (congestive) heart failure: Secondary | ICD-10-CM | POA: Diagnosis not present

## 2023-12-13 DIAGNOSIS — J449 Chronic obstructive pulmonary disease, unspecified: Secondary | ICD-10-CM | POA: Diagnosis not present

## 2023-12-13 LAB — BLOOD GAS, ARTERIAL
Acid-Base Excess: 1.7 mmol/L (ref 0.0–2.0)
Acid-Base Excess: 4.3 mmol/L — ABNORMAL HIGH (ref 0.0–2.0)
Bicarbonate: 30.5 mmol/L — ABNORMAL HIGH (ref 20.0–28.0)
Bicarbonate: 33.4 mmol/L — ABNORMAL HIGH (ref 20.0–28.0)
Drawn by: 252031
Drawn by: 42783
O2 Saturation: 91.7 %
O2 Saturation: 99.4 %
Patient temperature: 36.9
Patient temperature: 37.3
pCO2 arterial: 68 mmHg (ref 32–48)
pCO2 arterial: 72 mmHg (ref 32–48)
pH, Arterial: 7.26 — ABNORMAL LOW (ref 7.35–7.45)
pH, Arterial: 7.28 — ABNORMAL LOW (ref 7.35–7.45)
pO2, Arterial: 65 mmHg — ABNORMAL LOW (ref 83–108)
pO2, Arterial: 110 mmHg — ABNORMAL HIGH (ref 83–108)

## 2023-12-13 LAB — MRSA NEXT GEN BY PCR, NASAL: MRSA by PCR Next Gen: DETECTED — AB

## 2023-12-13 LAB — URINALYSIS, ROUTINE W REFLEX MICROSCOPIC
Bilirubin Urine: NEGATIVE
Glucose, UA: NEGATIVE mg/dL
Hgb urine dipstick: NEGATIVE
Ketones, ur: NEGATIVE mg/dL
Leukocytes,Ua: NEGATIVE
Nitrite: NEGATIVE
Protein, ur: NEGATIVE mg/dL
Specific Gravity, Urine: 1.011 (ref 1.005–1.030)
pH: 5 (ref 5.0–8.0)

## 2023-12-13 LAB — PROCALCITONIN: Procalcitonin: 0.75 ng/mL

## 2023-12-13 LAB — CBC
HCT: 36.9 % — ABNORMAL LOW (ref 39.0–52.0)
Hemoglobin: 11.9 g/dL — ABNORMAL LOW (ref 13.0–17.0)
MCH: 35.1 pg — ABNORMAL HIGH (ref 26.0–34.0)
MCHC: 32.2 g/dL (ref 30.0–36.0)
MCV: 108.8 fL — ABNORMAL HIGH (ref 80.0–100.0)
Platelets: 167 10*3/uL (ref 150–400)
RBC: 3.39 MIL/uL — ABNORMAL LOW (ref 4.22–5.81)
RDW: 18 % — ABNORMAL HIGH (ref 11.5–15.5)
WBC: 8.6 10*3/uL (ref 4.0–10.5)
nRBC: 0.3 % — ABNORMAL HIGH (ref 0.0–0.2)

## 2023-12-13 LAB — BASIC METABOLIC PANEL WITH GFR
Anion gap: 13 (ref 5–15)
BUN: 48 mg/dL — ABNORMAL HIGH (ref 8–23)
CO2: 27 mmol/L (ref 22–32)
Calcium: 8.9 mg/dL (ref 8.9–10.3)
Chloride: 97 mmol/L — ABNORMAL LOW (ref 98–111)
Creatinine, Ser: 1.48 mg/dL — ABNORMAL HIGH (ref 0.61–1.24)
GFR, Estimated: 53 mL/min — ABNORMAL LOW (ref >60)
Glucose, Bld: 128 mg/dL — ABNORMAL HIGH (ref 70–99)
Potassium: 4.5 mmol/L (ref 3.5–5.1)
Sodium: 137 mmol/L (ref 135–145)

## 2023-12-13 LAB — RAPID URINE DRUG SCREEN, HOSP PERFORMED
Amphetamines: NOT DETECTED
Barbiturates: NOT DETECTED
Benzodiazepines: NOT DETECTED
Cocaine: NOT DETECTED
Opiates: NOT DETECTED
Tetrahydrocannabinol: NOT DETECTED

## 2023-12-13 LAB — GLUCOSE, CAPILLARY
Glucose-Capillary: 115 mg/dL — ABNORMAL HIGH (ref 70–99)
Glucose-Capillary: 117 mg/dL — ABNORMAL HIGH (ref 70–99)
Glucose-Capillary: 127 mg/dL — ABNORMAL HIGH (ref 70–99)
Glucose-Capillary: 151 mg/dL — ABNORMAL HIGH (ref 70–99)
Glucose-Capillary: 203 mg/dL — ABNORMAL HIGH (ref 70–99)

## 2023-12-13 LAB — CBG MONITORING, ED: Glucose-Capillary: 186 mg/dL — ABNORMAL HIGH (ref 70–99)

## 2023-12-13 LAB — AMMONIA: Ammonia: 36 umol/L — ABNORMAL HIGH (ref 9–35)

## 2023-12-13 LAB — MAGNESIUM: Magnesium: 2.3 mg/dL (ref 1.7–2.4)

## 2023-12-13 MED ORDER — ORAL CARE MOUTH RINSE
15.0000 mL | OROMUCOSAL | Status: DC
  Administered 2023-12-13 – 2023-12-15 (×8): 15 mL via OROMUCOSAL

## 2023-12-13 MED ORDER — SODIUM CHLORIDE 0.9 % IV SOLN
100.0000 mg | Freq: Two times a day (BID) | INTRAVENOUS | Status: DC
  Administered 2023-12-13 – 2023-12-16 (×7): 100 mg via INTRAVENOUS
  Filled 2023-12-13 (×10): qty 100

## 2023-12-13 MED ORDER — CHLORHEXIDINE GLUCONATE CLOTH 2 % EX PADS
6.0000 | MEDICATED_PAD | Freq: Every day | CUTANEOUS | Status: DC
  Administered 2023-12-13 – 2023-12-15 (×3): 6 via TOPICAL

## 2023-12-13 MED ORDER — ALBUTEROL SULFATE (2.5 MG/3ML) 0.083% IN NEBU: 2.5000 mg | INHALATION_SOLUTION | RESPIRATORY_TRACT | Status: DC | PRN

## 2023-12-13 MED ORDER — INSULIN ASPART 100 UNIT/ML IJ SOLN
0.0000 [IU] | INTRAMUSCULAR | Status: DC
  Administered 2023-12-13 – 2023-12-14 (×2): 3 [IU] via SUBCUTANEOUS
  Administered 2023-12-14: 1 [IU] via SUBCUTANEOUS
  Administered 2023-12-14: 3 [IU] via SUBCUTANEOUS
  Administered 2023-12-14 – 2023-12-15 (×3): 2 [IU] via SUBCUTANEOUS

## 2023-12-13 MED ORDER — FUROSEMIDE 10 MG/ML IJ SOLN: 80.0000 mg | Freq: Two times a day (BID) | INTRAMUSCULAR | Status: DC

## 2023-12-13 MED ORDER — ARFORMOTEROL TARTRATE 15 MCG/2ML IN NEBU
15.0000 ug | INHALATION_SOLUTION | Freq: Two times a day (BID) | RESPIRATORY_TRACT | Status: DC
  Administered 2023-12-13 – 2023-12-16 (×6): 15 ug via RESPIRATORY_TRACT
  Filled 2023-12-13 (×6): qty 2

## 2023-12-13 MED ORDER — FUROSEMIDE 10 MG/ML IJ SOLN
100.0000 mg | Freq: Three times a day (TID) | INTRAVENOUS | Status: DC
  Administered 2023-12-13: 100 mg via INTRAVENOUS
  Filled 2023-12-13 (×4): qty 10

## 2023-12-13 MED ORDER — BUDESONIDE 0.5 MG/2ML IN SUSP
0.5000 mg | Freq: Two times a day (BID) | RESPIRATORY_TRACT | Status: DC
  Administered 2023-12-13 – 2023-12-16 (×6): 0.5 mg via RESPIRATORY_TRACT
  Filled 2023-12-13 (×6): qty 2

## 2023-12-13 MED ORDER — REVEFENACIN 175 MCG/3ML IN SOLN
175.0000 ug | Freq: Every day | RESPIRATORY_TRACT | Status: DC
  Administered 2023-12-14 – 2023-12-16 (×3): 175 ug via RESPIRATORY_TRACT
  Filled 2023-12-13 (×3): qty 3

## 2023-12-13 MED ORDER — MUPIROCIN 2 % EX OINT
1.0000 | TOPICAL_OINTMENT | Freq: Two times a day (BID) | CUTANEOUS | Status: DC
Start: 2023-12-13 — End: 2023-12-16
  Administered 2023-12-13 – 2023-12-15 (×5): 1 via NASAL
  Filled 2023-12-13 (×2): qty 22

## 2023-12-13 MED ORDER — ORAL CARE MOUTH RINSE: 15.0000 mL | OROMUCOSAL | Status: DC | PRN

## 2023-12-13 MED ORDER — SODIUM CHLORIDE 0.9 % IV SOLN
2.0000 g | INTRAVENOUS | Status: AC
  Administered 2023-12-13 – 2023-12-16 (×4): 2 g via INTRAVENOUS
  Filled 2023-12-13 (×4): qty 20

## 2023-12-13 MED ORDER — LORAZEPAM 2 MG/ML IJ SOLN: 1.0000 mg | INTRAMUSCULAR | Status: DC | PRN

## 2023-12-13 MED ORDER — FUROSEMIDE 10 MG/ML IJ SOLN
100.0000 mg | Freq: Three times a day (TID) | INTRAVENOUS | Status: DC
  Administered 2023-12-13 – 2023-12-16 (×8): 100 mg via INTRAVENOUS
  Filled 2023-12-13 (×12): qty 10

## 2023-12-13 MED ORDER — HYDROMORPHONE HCL 1 MG/ML IJ SOLN
0.5000 mg | INTRAMUSCULAR | Status: DC | PRN
  Administered 2023-12-13 – 2023-12-14 (×4): 0.5 mg via INTRAVENOUS
  Filled 2023-12-13 (×4): qty 0.5

## 2023-12-13 NOTE — Progress Notes (Signed)
 Luis [name]  FMW:[id number] DOB: 08/14/1960 DOA: 12/12/2023 PCP: Elnor [name], NP    Brief Narrative:  63 year old with a history of HTN, DM2, prior DVT, COPD, chronic diastolic CHF, CHB status post pacemaker, alcohol abuse, and diverticulitis status post colectomy with complicated postoperative wound infection who presented to the ER 12/12/2023 with worsening shortness of breath and progressive lower extremity edema over several days.  At presentation he was saturating in the low 90s on room air with mild tachypnea and increased work of breathing.  CXR suggested interstitial edema with patchy airspace opacities in the bases.  Goals of Care:   Code Status: Full Code   DVT prophylaxis: heparin  injection 5,000 Units Start: 12/13/23 0600   Interim Hx: Patient experienced worsening hypoxia and confusion after transfer to the floor last night.  BiPAP was initiated.  Prior to this he had been given 1mg  Ativan  due to an elevated CIWA score.  Afebrile since presentation.  Vital signs stable.  ABG this a.m. revealed significant hypercarbia with acidosis.  At the time of my visit the patient remains sedate.  He will awaken to a sternal rub or loud voice but quickly goes back to sleep.  He cannot provide a reliable history.  He is tolerating BiPAP well and despite his unfavorable ABG does not presently appear to be an extremis.  Assessment & Plan:  Acute exacerbation of chronic diastolic CHF TTE July 2024 noted EF 50-55% with grade 2 DD -continue to diurese -on Farxiga  Toprol  and losartan  at home -taken off Aldactone  due to hyperkalemia -baseline dry weight appears to be approximately 105 kg -increase Lasix  significantly today and monitor trend  Filed Weights   12/12/23 1555 12/13/23 0340  Weight: 109 kg 113.3 kg     Possible community-acquired pneumonia CXR not definitive -procalcitonin elevated at presentation -continue empiric antibiotic  Acute hypercarbic and hypoxic respiratory  failure Likely primarily due to acute CHF exacerbation but likely also contributions from pneumonia and obesity and COPD -I will consult PCCM if the patient does not have a quick response to BiPAP therapy  AKI Baseline creatinine approximately 1.2-1.5 -creatinine 1.9 at presentation -holding losartan , Farxiga , and metformin  -creatinine has returned to his baseline range  COPD without acute exacerbation Was referred for outpatient pulmonary rehab but did not keep appointments -no wheezing on exam at this time  DM2 CBG well-controlled while n.p.o.  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   Family Communication: No family present at time of exam Disposition: Ultimate disposition unclear at present -anticipate probable transfer to ICU later today   Objective: Blood pressure 138/80, pulse 78, temperature 98.6 F (37 C), temperature source Oral, resp. rate 20, height 6' 1 (1.854 m), weight 113.3 kg, SpO2 95%.  Intake/Output Summary (Last 24 hours) at 12/13/2023 0818 Last data filed at 12/13/2023 0357 Gross per 24 hour  Intake 100 ml  Output 150 ml  Net -50 ml   Filed Weights   12/12/23 1555 12/13/23 0340  Weight: 109 kg 113.3 kg    Examination: General: Obtunded -not yet in extremis Lungs: Poor air movement throughout all fields with fine crackles appreciable diffusely Cardiovascular: Regular rate and  rhythm without murmur gallop or rub normal S1 and S2 Abdomen: Obese, soft, bowel sounds positive, no mass or rebound Cutaneous: There is an ABD pad laying across the patient's abdomen that appears old/soiled -I removed  the pad and underneath there are multiple dressing types that are adherent to the skin with severe flaking dry skin across the whole of the abdomen with the periphery of intense erythema and some very superficial necrotic tissue in the center of the wound but with no frank purulent discharge and no evidence of abscess Extremities: 2+ bilateral lower extremity edema up above the knees  CBC: Recent Labs  Lab 12/12/23 1600 12/13/23 0426  WBC 10.5 8.6  NEUTROABS 7.8*  --   HGB 13.3 11.9*  HCT 40.8 36.9*  MCV 107.9* 108.8*  PLT 208 167   Basic Metabolic Panel: Recent Labs  Lab 12/12/23 1600 12/13/23 0426  NA 133* 137  K 4.9 4.5  CL 93* 97*  CO2 24 27  GLUCOSE 122* 128*  BUN 55* 48*  CREATININE 1.89* 1.48*  CALCIUM  9.8 8.9  MG  --  2.3   GFR: Estimated Creatinine Clearance: 66.5 mL/min (A) (by C-G formula based on SCr of 1.48 mg/dL (H)).   Scheduled Meds:  folic acid   1 mg Oral Daily   furosemide   40 mg Intravenous BID   gabapentin   300 mg Oral q morning   gabapentin   600 mg Oral QHS   heparin   5,000 Units Subcutaneous Q8H   insulin  aspart  0-5 Units Subcutaneous QHS   insulin  aspart  0-6 Units Subcutaneous TID WC   LORazepam   0-4 mg Oral Q6H   Followed by   NOREEN ON 12/15/2023] LORazepam   0-4 mg Oral Q12H   metoprolol  succinate  25 mg Oral Daily   multivitamin with minerals  1 tablet Oral Daily   sodium chloride  flush  3 mL Intravenous Q12H   thiamine   100 mg Oral Daily   Or   thiamine   100 mg Intravenous Daily      LOS: 1 day   Reyes IVAR Moores, MD [facility] Office  [phone] Pager - Text Page per Tracey  If 7PM-7AM, please contact night-coverage per Amion 12/13/2023, 8:18 AM

## 2023-12-13 NOTE — Progress Notes (Signed)
 ABG 12/13/23 6:40  Critical pco2  68

## 2023-12-13 NOTE — Progress Notes (Signed)
 CSW received consult for patient. CSW added outpatient substance use treatment services resources to patients AVS.

## 2023-12-13 NOTE — Procedures (Signed)
 Patient Name: Luis [name]  MRN: [id number]  Epilepsy Attending: Arlin MALVA Krebs  Referring Physician/Provider: Antonetta Vina NOVAK, NP  Date: 12/13/2023  Duration: 23.16 mins  Patient history: 63yo M with ams. EEG to evaluate for seizure  Level of alertness: Awake, asleep  AEDs during EEG study: GBP, Ativan   Technical aspects: This EEG study was done with scalp electrodes positioned according to the 10-20 International system of electrode placement. Electrical activity was reviewed with band pass filter of 1-70Hz , sensitivity of 7 uV/mm, display speed of 28mm/sec with a 60Hz  notched filter applied as appropriate. EEG data were recorded continuously and digitally stored.  Video monitoring was available and reviewed as appropriate.  Description: The posterior dominant rhythm consists of 8-9 Hz activity of moderate voltage (25-35 uV) seen predominantly in posterior head regions, symmetric and reactive to eye opening and eye closing. Sleep was characterized by vertex waves, sleep spindles (12 to 14 Hz), maximal frontocentral region.   Hyperventilation and photic stimulation were not performed.     IMPRESSION: This study is within normal limits. No seizures or epileptiform discharges were seen throughout the recording.  A normal interictal EEG does not exclude of epilepsy.   Luis [name]

## 2023-12-13 NOTE — Progress Notes (Signed)
 Placed patient on bipap due to patient being obtunded. ABG drawn and sent to lab.

## 2023-12-13 NOTE — Consult Note (Signed)
 WOC Nurse Consult Note: Reason for Consult:Patient was seen this AM, Ostomy pouch changed and orders written for wound care.  Supplies to be ordered.  No further WOC needs at this time.  Will not follow at this time.  Please re-consult if needed.  Darice Cooley MSN, RN, FNP-BC CWON Wound, Ostomy, Continence Nurse Outpatient [facility] [phone] Pager [phone]

## 2023-12-13 NOTE — Consult Note (Signed)
 WOC Nurse Consult Note: Reason for Consult:Nonhealing abdominal wounds, ventral hernia and RLQ ileostomy Wound type:Nonhealing surgical  Pressure Injury POA: NA Measurement:Scattered scabbed dry lesions with 2 nonintact lesions with chronic purulent drainage  musty odor Wound azi:wnwhmjwlojuzi, red, moist Drainage (amount, consistency, odor) Moderate creamy musty effluent Periwound:Scarring, chronic dermatitis/epithelial shedding from stretching of skin due to hernia.  Dressing procedure/placement/frequency:Cleanse abdominal wounds and surrounding skin with VASHE cleanser (LAWSON # [id number])  Apply Aquacel to open wounds (LAWSON # [id number]) - need 3 sheets-  cover with gauze and ABD Pads.  Secure with tape.  Change 12/13/23 and twice weekly. (Due again 12/16/23)  Twice weekly while inpatient.   WOC Nurse ostomy consult note Stoma type/location: RLQ ileostomy, flush  Stomal assessment/size: 1  flush pink and moist Peristomal assessment: creasing at 4 o'clock, abdominal hernia complicates pouching.  Treatment options for stomal/peristomal skin: Stoma powder and skin prep  Barrier ring at creasing and around stoma.  Wears 1 piece flat pouch (I placed in coloplast today, patient prefers) Will use formulary [facility]) pouches if change is needed again.  Output Liquid brown stool Ostomy pouching: 1pc. Flat with barrier ring.  Patient does not like convexity.  Education provided: Patient is on Bipap but opens eyes to respond to questions.   Will not follow at this time.  Please re-consult if needed.  Darice Cooley MSN, RN, FNP-BC CWON Wound, Ostomy, Continence Nurse Outpatient [facility] [phone] Pager [phone]

## 2023-12-13 NOTE — Progress Notes (Addendum)
 eLink Physician-Brief Progress Note Patient Name: Luis [name] DOB: 07-29-1960 MRN: [id number]   Date of Service  12/13/2023  HPI/Events of Note  Patient reporting gout pain that is normally managed with colchicine , currently has an acute kidney injury and is in respiratory failure requiring BiPAP  eICU Interventions  Low-dose Dilaudid as needed   2229 -persistent foot pain despite Dilaudid at 2007, now having chest pain as well.  Tolerating being off of BiPAP.  Vital signs are completely normal with no objective evidence of pain.  For now, no intervention.  Intervention Category Intermediate Interventions: Pain - evaluation and management  Tran Randle 12/13/2023, 7:54 PM

## 2023-12-13 NOTE — Addendum Note (Signed)
 Encounter addended by: Jasmain Ahlberg, RN on: 12/13/2023 9:19 AM  Actions taken: Flowsheet data copied forward, Flowsheet accepted, Clinical Note Signed

## 2023-12-13 NOTE — Progress Notes (Signed)
 Pt given scheduled aerosol treatments at this time thru the bipap. Once txs completed pt taken off bipap at this time and placed on 8L HFNC without complication. RN update. RT will continue to monitor and be available as needed.

## 2023-12-13 NOTE — Discharge Instructions (Addendum)
 Outpatient Substance Use Treatment Services   [facility]  Chemical Dependence Intensive Outpatient Program [address] [place], [place] [address]  [phone] Private insurance, Medicare A&B, and [facility]   [facility] (Alcohol and Drug Services)  [address],  [place], [place] [address] [phone] Medicaid, Self Pay   [facility]      [address]  [place], [place] [phone] Medicaid and [facility], Self Pay   [facility] [address]  [place], [place]  [phone] [facility] [place], and Self Pay  [facility] [place]      [address]    [place], [place] [address]  [phone] or [phone] Private Insurance Only                 [facility] [address]  [place], [place]  [phone] Medicaid, Medicare, Private Insurance  [facility] Counseling Services at the [facility] [address]  [place], [place] [address] [phone] Services are free or reduced  [facility]  [address] [phone]  Self Pay only, sliding scale  [facility]  [address]  [place], [place] [address] [phone] ([employer]) Self Pay, Medicaid Only   [facility] [address], [place] [address] [phone] Medicaid, Medicare, Private Insurance                     Adolescent Substance Use Treatment Services    [facility] [address]  [place], [place]  [phone] Self Pay Offer scholarships from the [facility] to help pay for treatment  Website: http://www.lambert.com/  [facility] Adolescent Substance use Program Males ages: 62-17 Adolescent Substance use Program Females: 12-17  [facility] [address]  [place], [place] [address] (ph) [phone]  (fax) [phone]  [facility]  [address]  [place], [place] [address] (ph) [phone]  (fax) [phone]  [facility] [address]  [place], [place] [address] (ph) [phone]  (fax) [phone]  [facility] [address], [place], [place] [address] (ph) [phone]   (fax) [phone]  Website: [email or web address].[email or web address]/         [facility] Substance Abuse Intensive Outpatient Program for Adolescents Phone: [phone] Address: [address], [place], [place] Website: https://wilkins.info/    Residential Substance Use Engineer, materials ([facility]  [address]  [place], [place] [address]  [phone] or [phone] Detox (Medicare, Medicaid, private insurance, and self pay)  Residential Rehab 14 days (Medicare, Medicaid, private insurance, and self pay)   RTS (Residential Treatment Services)  [address] [place], [place]  [phone]  Male and Male Detox (Self Pay and Medicaid limited availability)  Rehab only Male (Medicaid and self pay only)   [facility] [address]      [address]  [place], [place] [address]  [phone] or [phone] Detox and Residential Treatment Private Insurance Only   [facility] Residential Treatment Facility  [address]  [place], [place] [address]  [phone]  Treatment Only, must make assessment appointment, and must be sober for assessment appointment.  Self Pay Only, Medicare A&B, [facility] [place] ID only! Youth worker offered from [place] [facility] [employer]     [address] [place], [place] [address] Walk in interviews M-Sat 8-4p  No pending legal charges [phone]     ADATC:  [facility] Referral  [address] [place], [place] [phone] (Self Pay, [facility])  [facility] [address] [place], [place] [address] [phone] Detox and Residential Treatment Medicare and Private Insurance  [facility] [address]  [place], [place] [address] 28 Day Women's Facility: [phone] 28 Day Men's Facility: [phone] Long-term Residential Program:  [phone] Males 25 and Over (No Insurance, upfront fee)  [facility]  [address] [place], [place] [address] [phone] Private Insurance with [employer], Private Pay  [facility] [address] [place], [place] [address] Local [phone] Private Insurance Only  [facility] [address]  [place], [place] [address]  [phone] (Males, upfront fee)  [facility] [address]  [place], [address] [phone] Private Insurance      [place]  [facility] Locations  [facility]  [address]  [place], [place]  [phone] [place] Based Program for individuals experiencing homelessness Self Pay, No insurance  [facility]  Men's program: [facility] [address] [place], [place] [address] [phone]  [facility] Women's program: [facility] [address] [place], [place] [address] [phone] Christian Based Program for individuals experiencing homelessness Self Pay, No insurance  [facility] Men's Division [address] [place], [place] [address]  [phone] [place] Based Program for individuals experiencing homelessness Self Pay, No insurance  [facility] Women's Division [address] [place], [place] [address] [phone] [place] Based Program for individuals experiencing homelessness Self Pay, No insurance  [facility] [address] [place], [place] [phone] Christian Based Program for males experiencing homelessness know Self Pay, No insurance

## 2023-12-13 NOTE — ED Notes (Signed)
 Patient continuously moving from laying in bed to side of bed. Cardiac leads continue to come off of patients. New stickers applied and leads replaced. Patient advised not to remove cardiac leads.

## 2023-12-13 NOTE — Progress Notes (Signed)
 Patient ID: Luis [name], male   DOB: 1961/01/06, 63 y.o.   MRN: [id number] Attempted to call sister and son to inform of transfer to ICU. Sister's mailbox full and unable to leave message. Message left on son's voicemail to call RN back.  Verdie JONETTA Collier, RN

## 2023-12-13 NOTE — Progress Notes (Signed)
 Heart Failure Navigator Progress Note  Assessed for Heart & Vascular TOC clinic readiness.  Patient does not meet criteria due to Advanced Heart Failure team patient of Dr. Gardenia.   Navigator will sign off at this time.   Stephane Haddock, BSN, Scientist, clinical (histocompatibility and immunogenetics) Only

## 2023-12-13 NOTE — Progress Notes (Signed)
 Attempted to undress abdominal wound to assess & photograph. Per pt he sees wound care regularly & that he was told to use aquacel that he covers with gauze & abdominal pads. Informed pt that staff would place a new wet to dry dressing but were unable to apply aquacel without an order & that WOC had been consulted & would place the appropriate orders after assessing photos or wound itself. Pt stated he didn't want to remove dressing d/t smell & staff being unable to apply aquacel at this time

## 2023-12-13 NOTE — Progress Notes (Signed)
 EEG complete - results pending

## 2023-12-13 NOTE — Consult Note (Signed)
 NAME:  Luis [name], MRN:  [id number], DOB:  Jun 15, 1960, LOS: 1 ADMISSION DATE:  12/12/2023, CONSULTATION DATE:  12/13/23 REFERRING MD:  Dr. Michele, CHIEF COMPLAINT:  AMS   History of Present Illness:  Pt encephalopathic, therefore HPI obtained from EMR review.  80 yoM with PMH of HTN, HFpEF, CHB s/p PPM, DMT2, COPD, tobacco abuse, ETOH, cirrhosis, diverticulitis s/p RLQ ileostomy with chronic non-healing abd wound followed OP by [facility] who presented 7/1 with SOB and worsening LE edema.  Saturations in low 90's on RA on admit, afebrile, reportedly no change in chronic cough.  Reported ETOH use 4 drinks a week with ongoing tobacco and marijuana use.  CXR showed Insterstitial edema and patchy airspace opacities in bases. Labs showed Na 133 BUN/ sCr 56/ 1.89, trop 39> 38, BNP 239, PCT 0.75, WBC 10.5.  Admitted to [facility] with HF exacerbation, AKI, pulmonary edema +/- r/o PNA.    Was given ativan  1mg  around 0039, with progressive hypoxia and increasing O2 needs overnight.  ABG 7.26/ 68/ 65/ 30, placed on BiPAP at shift change with ongoing lasix .  Since mental status remains poor and no significant improvement on ABG 7.28/ 72/ 110/ 33.  PCCM consulted for further evaluation.   Pertinent  Medical History   Past Medical History:  Diagnosis Date   Acute respiratory failure with hypoxia (HCC) 12/23/2022   Alcohol withdrawal syndrome, with delirium (HCC) 12/27/2022   COPD (chronic obstructive pulmonary disease) (HCC)    Diabetes mellitus without complication (HCC)    Diverticulitis    DVT (deep venous thrombosis) (HCC)    x3   Encephalopathy acute 12/28/2022   ETOH abuse    H/O colectomy    Hyperlipidemia    Hypertension    Sepsis associated hypotension (HCC) 08/03/2019   Smoker    Significant Hospital Events: Including procedures, antibiotic start and stop dates in addition to other pertinent events     Interim History / Subjective:   Objective    Blood pressure 123/64, pulse 60, temperature  99.2 F (37.3 C), temperature source Axillary, resp. rate (!) 24, height 6' 1 (1.854 m), weight 113.3 kg, SpO2 94%.    Vent Mode: BIPAP;PCV FiO2 (%):  [70 %-80 %] 70 % Set Rate:  [18 bmp] 18 bmp PEEP:  [5 cmH20] 5 cmH20   Intake/Output Summary (Last 24 hours) at 12/13/2023 1207 Last data filed at 12/13/2023 9642 Gross per 24 hour  Intake 100 ml  Output 150 ml  Net -50 ml   Filed Weights   12/12/23 1555 12/13/23 0340  Weight: 109 kg 113.3 kg    Examination: General:  AoC ill appearing older male in NAD on BiPAP HEENT: pupils 3/r, anicteric  Neuro: startles with stimulation, briefly opens eyes, MAE to noxious stimuli but not spont, not f/c  CV: vpaced, ?murmur PULM:  BiPAP 10/5, 70% with TV> 500 occ large volume, coarse/ diminished  GI: obese, +bs, R ostomy, large chronic appearing superficial wounds to lower abd with dressing Extremities: warm/dry, +1-2 pedal edema  Skin: no rashes    Resolved problem list   Assessment and Plan    Acute hypoxic and hypercarbic respiratory  R/o PNA, pulmonary edema  COPD Tobacco Abuse  - tx to ICU for close airway monitoring.  High risk for intubation - getting good TV's on BiPAP, continue for now - goal sat > 92% - NPO - aggressive diureses as below - empiric CAP coverage - prn CXR - triple therapy nebs with prn albuterol .  No wheezing,  hold off on systemic steroids  - trend PCT/ fever/ WBC curve   Acute metabolic encephalopathy ETOH abuse - reported 4 drinks per week on admit? - EEG  - check ammonia, UDS, UA - serial neuro exams - continue CIWA w/ ativan  for now, monitor closely for withdrawal.  Consider precedex  if needed  - thiamine   - neuro protective measures  - possible infectious treatment as above - avoid sedating meds  Acute on chronic HFpEF exacerbation Hypertension CHB s/p PPM - TTE 12/2022 EF 50-55%, G2DD - baseline wt around 105kg, currently 113kg - remains NPO, normotensive, cont to hold metoprolol   -  aggressive diureses  - continue holding GDMT> losartan , farxiga  - optimize electrolytes   AKI - baseline sCr 1.2-1.5 - sCr improved from admit, today 1.48 but only UOP thus far - bladder scan to r/o retention with encephalopathy - check UA - trend renal indices  - strict I/Os, daily wts - avoid nephrotoxins, renal dose meds, hemodynamic support as above   DMT2 - previous A1c 07/2023 7.3 - change to CBG q4 prn/ SSI prn   Hx RLQ ileostomy  Chronic abd wound - followed outpt by [facility] - cont abx as above - WOC  Obesity - BMI 32.96 kg/ m2  Best Practice (right click and Reselect all SmartList Selections daily)   Diet/type: NPO DVT prophylaxis prophylactic heparin   Pressure ulcer(s): present on admission - chronic abd wound GI prophylaxis: N/A Lines: N/A Foley:  N/A Code Status:  full code Last date of multidisciplinary goals of care discussion [pending]  NOK- son and sister attempted to call> both straight to automated generic voicemail.   Labs   CBC: Recent Labs  Lab 12/12/23 1600 12/13/23 0426  WBC 10.5 8.6  NEUTROABS 7.8*  --   HGB 13.3 11.9*  HCT 40.8 36.9*  MCV 107.9* 108.8*  PLT 208 167    Basic Metabolic Panel: Recent Labs  Lab 12/12/23 1600 12/13/23 0426  NA 133* 137  K 4.9 4.5  CL 93* 97*  CO2 24 27  GLUCOSE 122* 128*  BUN 55* 48*  CREATININE 1.89* 1.48*  CALCIUM  9.8 8.9  MG  --  2.3   GFR: Estimated Creatinine Clearance: 66.5 mL/min (A) (by C-G formula based on SCr of 1.48 mg/dL (H)). Recent Labs  Lab 12/12/23 1600 12/13/23 0426  PROCALCITON  --  0.75  WBC 10.5 8.6    Liver Function Tests: Recent Labs  Lab 12/12/23 1600  AST 45*  ALT 30  ALKPHOS 114  BILITOT 1.2  PROT 8.0  ALBUMIN 4.0   No results for input(s): LIPASE, AMYLASE in the last 168 hours. No results for input(s): AMMONIA in the last 168 hours.  ABG    Component Value Date/Time   PHART 7.28 (L) 12/13/2023 1040   PCO2ART 72 (HH) 12/13/2023 1040    PO2ART 110 (H) 12/13/2023 1040   HCO3 33.4 (H) 12/13/2023 1040   TCO2 26 12/25/2022 2309   ACIDBASEDEF 1.0 12/25/2022 2309   O2SAT 99.4 12/13/2023 1040     Coagulation Profile: No results for input(s): INR, PROTIME in the last 168 hours.  Cardiac Enzymes: No results for input(s): CKTOTAL, CKMB, CKMBINDEX, TROPONINI in the last 168 hours.  HbA1C: Hgb A1c MFr Bld  Date/Time Value Ref Range Status  08/06/2023 11:15 PM 7.3 (H) 4.8 - 5.6 % Final    Comment:    (NOTE) Pre diabetes:          5.7%-6.4%  Diabetes:              >  6.4%  Glycemic control for   <7.0% adults with diabetes   02/16/2023 04:23 AM 7.0 (H) 4.8 - 5.6 % Final    Comment:    (NOTE) Pre diabetes:          5.7%-6.4%  Diabetes:              >6.4%  Glycemic control for   <7.0% adults with diabetes     CBG: Recent Labs  Lab 12/13/23 0043 12/13/23 0625  GLUCAP 186* 151*    Review of Systems:   unable  Past Medical History:  He,  has a past medical history of Acute respiratory failure with hypoxia (HCC) (12/23/2022), Alcohol withdrawal syndrome, with delirium (HCC) (12/27/2022), COPD (chronic obstructive pulmonary disease) (HCC), Diabetes mellitus without complication (HCC), Diverticulitis, DVT (deep venous thrombosis) (HCC), Encephalopathy acute (12/28/2022), ETOH abuse, H/O colectomy, Hyperlipidemia, Hypertension, Sepsis associated hypotension (HCC) (08/03/2019), and Smoker.   Surgical History:   Past Surgical History:  Procedure Laterality Date   COLOSTOMY     PACEMAKER LEADLESS INSERTION N/A 12/31/2020   Procedure: PACEMAKER LEADLESS INSERTION;  Surgeon: Cindie Ole DASEN, MD;  Location: [facility];  Service: Cardiovascular;  Laterality: N/A;   TONSILLECTOMY       Social History:   reports that he has been smoking cigarettes. He started smoking about 48 years ago. He has a 36.4 pack-year smoking history. He uses smokeless tobacco. He reports current alcohol use of about 4.0  standard drinks of alcohol per week. He reports current drug use. Drug: Marijuana.   Family History:  His family history includes COPD in his mother; Prostate cancer in his father. There is no history of Colon cancer, Rectal cancer, Stomach cancer, or Esophageal cancer.   Allergies Allergies  Allergen Reactions   Codeine Hives   Lisinopril Other (See Comments)    Other reaction(s): renal effects     Home Medications  Prior to Admission medications   Medication Sig Start Date End Date Taking? Authorizing Provider  acetaminophen  (ACETAMINOPHEN  8 HOUR) 650 MG CR tablet Take 1 tablet (650 mg total) by mouth every 8 (eight) hours as needed for pain Patient taking differently: Take 1,300 mg by mouth every 8 (eight) hours as needed for pain or fever. 11/10/22  Yes   amLODipine  (NORVASC ) 10 MG tablet Take 1 tablet (10 mg total) by mouth daily. 11/29/23  Yes Webb, Padonda B, FNP  ascorbic acid  (VITAMIN C ) 500 MG tablet Take 1 tablet (500 mg total) by mouth daily. 01/01/23  Yes   atorvastatin  (LIPITOR) 10 MG tablet Take 1 tablet (10 mg total) by mouth daily. 09/05/23  Yes Elnor [name], NP  cetirizine  (ZYRTEC ) 10 MG tablet Take 1 tablet (10 mg total) by mouth daily. 06/28/23  Yes Elnor [name], NP  dapagliflozin  propanediol (FARXIGA ) 10 MG TABS tablet Take 1 tablet (10 mg total) by mouth daily before breakfast. 03/10/23  Yes Elnor [name], NP  folic acid  (FOLVITE ) 1 MG tablet Take 1 tablet (1 mg total) by mouth daily. 11/23/23  Yes Webb, Padonda B, FNP  gabapentin  (NEURONTIN ) 300 MG capsule Take 2 capsules (600 mg total) by mouth 2 (two) times daily. 11/23/23  Yes Sikora, Rebecca, DPM  guaiFENesin  (MUCINEX ) 600 MG 12 hr tablet Take 1 tablet (600 mg total) by mouth 2 (two) times daily as needed to loosen phlegm or cough. 10/11/23  Yes Elnor [name], NP  hydrOXYzine  (VISTARIL ) 50 MG capsule Take 1 capsule (50 mg total) by mouth at bedtime as  needed. 09/22/23  Yes Elnor [name], NP  losartan  (COZAAR ) 25 MG  tablet Take 1 tablet (25 mg total) by mouth daily. 03/29/23  Yes Sabharwal, Aditya, DO  metFORMIN  (GLUCOPHAGE ) 500 MG tablet Take 2 tablets (1,000 mg total) by mouth daily with breakfast AND 1 tablet (500 mg total) daily with supper. 05/04/23  Yes Elnor [name], NP  metoprolol  succinate (TOPROL -XL) 25 MG 24 hr tablet Take 1 tablet (25 mg total) by mouth daily. 03/29/23  Yes Sabharwal, Aditya, DO  Tiotropium Bromide -Olodaterol 2.5-2.5 MCG/ACT AERS Inhale 2 puffs into the lungs daily. 10/03/23  Yes Hope Almarie ORN, NP  torsemide  (DEMADEX ) 20 MG tablet Take 4 tablets (80 mg total) by mouth daily. 12/06/23  Yes Webb, Padonda B, FNP  triamcinolone  cream (KENALOG ) 0.5 % Apply topically to legs twice daily for up to 2 weeks as needed for eczema 08/23/23  Yes Elnor [name], NP  Accu-Chek Softclix Lancets lancets Use to test blood sugar once daily 10/20/23 01/21/24  Elnor [name], NP  glucose blood (ACCU-CHEK GUIDE TEST) test strip Use to test blood sugar once daily 10/20/23 01/31/24  Elnor [name], NP  ipratropium-albuterol  (DUONEB) 0.5-2.5 (3) MG/3ML SOLN Take 3 mLs by nebulization every 6 (six) hours as needed (wheezing / sob). Patient not taking: Reported on 12/13/2023    [provider]  icosapent  Ethyl (VASCEPA ) 1 g capsule Take 2 capsules (2 g total) by mouth 2 (two) times daily. 03/09/23 03/15/23  Elnor [name], NP     Critical care time: 50 mins      Lyle Pesa, MSN, AG-ACNP-BC [facility] 12/13/2023, 12:07 PM  See Amion for pager If no response to pager , please call [phone] until 7pm After 7:00 pm call Elink  [phone]

## 2023-12-13 NOTE — ED Notes (Signed)
 Patient sitting on edge of bed. Cardiac leads off. Leads replaced. Patient repositioned and educated on purpose of cardiac monitoring.

## 2023-12-13 NOTE — Progress Notes (Signed)
 Discharge Progress Report  Patient Details  Name: Luis [name] MRN: [id number] Date of Birth: [birth year] Referring Provider:   [facility] Walk Test from 10/25/2023 in [facility]  Referring Provider Dr. 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 Glade Hope    Expected Discharge Date 01/23/24      Arm Ergometer   Level 1    Minutes 15    METs 1.5      Track   Laps 4    Minutes 15    METs 1.62      Prescription Details   Frequency (times per week) 2    Duration Progress to 30 minutes of continuous aerobic without signs/symptoms of physical distress      Intensity   THRR 40-80% of Max Heartrate 62-125    Ratings of Perceived Exertion 11-13    Perceived Dyspnea 0-4      Progression   Progression Continue to progress workloads to maintain intensity without signs/symptoms of physical distress.      Resistance Training   Training Prescription Yes    Weight red bands    Reps 10-15          Discharge Exercise Prescription (Final Exercise Prescription Changes):  Exercise Prescription Changes - 11/16/23  1501       Response to Exercise   Blood Pressure (Exercise) 142/60    Blood Pressure (Exit) 142/60    Heart Rate (Admit) 65 bpm    Heart Rate (Exercise) 70 bpm    Heart Rate (Exit) 62 bpm    Oxygen Saturation (Admit) 93 %    Oxygen Saturation (Exercise) 92 %    Oxygen Saturation (Exit) 92 %    Rating of Perceived Exertion (Exercise) 13    Perceived Dyspnea (Exercise) 2    Duration Progress to 30 minutes of  aerobic without signs/symptoms of physical distress    Intensity THRR unchanged      Progression   Progression Continue to progress workloads to maintain intensity without signs/symptoms of physical distress.      Resistance Training   Training Prescription Yes    Weight red bands    Reps 10-15    Time 10 Minutes      Track   Laps 7    Minutes 29    METs 2.08  Functional Capacity:  6 Minute Walk     Row Name 10/25/23 1510         6 Minute Walk   Phase Initial     Distance 653 feet     Walk Time 6 minutes     # of Rest Breaks 1  [id number]     MPH 1.24     METS 1.84     RPE 13     Perceived Dyspnea  0     VO2 Peak 6.44     Symptoms Yes (comment)     Comments bilateral hip pain     Resting HR 60 bpm     Resting BP 104/60     Resting Oxygen Saturation  94 %     Exercise Oxygen Saturation  during 6 min walk 90 %     Max Ex. HR 84 bpm     Max Ex. BP 142/68     2 Minute Post BP 130/66       Interval HR   1 Minute HR 63     2 Minute HR 67     3 Minute HR 78     4 Minute HR 77     5 Minute HR 80     6 Minute HR 84     2 Minute Post HR 67     Interval Heart Rate? Yes       Interval Oxygen   Interval Oxygen? Yes     Baseline Oxygen Saturation % 94 %     1 Minute Oxygen Saturation % 92 %     1 Minute Liters of Oxygen 0 L     2 Minute Oxygen Saturation % 91 %     2 Minute Liters of Oxygen 0 L     3 Minute Oxygen Saturation % 90 %     3 Minute Liters of Oxygen 0 L     4 Minute Oxygen Saturation % 90 %     4 Minute Liters of Oxygen 0 L      5 Minute Oxygen Saturation % 91 %     5 Minute Liters of Oxygen 0 L     6 Minute Oxygen Saturation % 90 %     6 Minute Liters of Oxygen 0 L     2 Minute Post Oxygen Saturation % 94 %     2 Minute Post Liters of Oxygen 0 L        Psychological, QOL, Others - Outcomes: PHQ 2/9:    10/25/2023    1:46 PM 10/20/2023    1:07 PM 10/12/2023    1:56 PM 10/12/2023    1:52 PM 06/15/2023    2:10 PM  Depression screen PHQ 2/9  Decreased Interest 1 0  0 0  Down, Depressed, Hopeless 1   3 0  PHQ - 2 Score 2 0  3 0  Altered sleeping 1   2   Tired, decreased energy 1   2   Change in appetite 0   0   Feeling bad or failure about yourself  1   1   Trouble concentrating 1   1   Moving slowly or fidgety/restless 0   0   Suicidal thoughts 0   0   PHQ-9 Score 6   9   Difficult doing work/chores Somewhat difficult  Very difficult      Quality of Life:   Personal Goals: Goals established at orientation with interventions provided to  work toward goal.  Personal Goals and Risk Factors at Admission - 10/25/23 1344       Core Components/Risk Factors/Patient Goals on Admission    Weight Management Yes;Obesity;Weight Loss    Intervention Weight Management: Develop a combined nutrition and exercise program designed to reach desired caloric intake, while maintaining appropriate intake of nutrient and fiber, sodium and fats, and appropriate energy expenditure required for the weight goal.;Weight Management: Provide education and appropriate resources to help participant work on and attain dietary goals.;Weight Management/Obesity: Establish reasonable short term and long term weight goals.;Obesity: Provide education and appropriate resources to help participant work on and attain dietary goals.    Admit Weight 69 lb 2.9 oz (31.4 kg)    Expected Outcomes Short Term: Continue to assess and modify interventions until short term weight is achieved;Long Term: Adherence to nutrition and physical activity/exercise  program aimed toward attainment of established weight goal;Weight Loss: Understanding of general recommendations for a balanced deficit meal plan, which promotes 1-2 lb weight loss per week and includes a negative energy balance of [phone] kcal/d;Understanding recommendations for meals to include 15-35% energy as protein, 25-35% energy from fat, 35-60% energy from carbohydrates, less than 200mg  of dietary cholesterol, 20-35 gm of total fiber daily;Understanding of distribution of calorie intake throughout the day with the consumption of 4-5 meals/snacks    Tobacco Cessation Yes    Number of packs per day 1/2    Intervention Assist the participant in steps to quit. Provide individualized education and counseling about committing to Tobacco Cessation, relapse prevention, and pharmacological support that can be provided by physician.;Education officer, environmental, assist with locating and accessing local/national Quit Smoking programs, and support quit date choice.    Expected Outcomes Short Term: Will demonstrate readiness to quit, by selecting a quit date.;Long Term: Complete abstinence from all tobacco products for at least 12 months from quit date.;Short Term: Will quit all tobacco product use, adhering to prevention of relapse plan.    Improve shortness of breath with ADL's Yes    Intervention Provide education, individualized exercise plan and daily activity instruction to help decrease symptoms of SOB with activities of daily living.    Expected Outcomes Short Term: Improve cardiorespiratory fitness to achieve a reduction of symptoms when performing ADLs;Long Term: Be able to perform more ADLs without symptoms or delay the onset of symptoms    Stress Yes    Intervention Offer individual and/or small group education and counseling on adjustment to heart disease, stress management and health-related lifestyle change. Teach and support self-help strategies.;Refer participants experiencing significant  psychosocial distress to appropriate mental health specialists for further evaluation and treatment. When possible, include family members and significant others in education/counseling sessions.    Expected Outcomes Short Term: Participant demonstrates changes in health-related behavior, relaxation and other stress management skills, ability to obtain effective social support, and compliance with psychotropic medications if prescribed.;Long Term: Emotional wellbeing is indicated by absence of clinically significant psychosocial distress or social isolation.           Personal Goals Discharge:  Goals and Risk Factor Review     Row Name 11/20/23 1406             Core Components/Risk Factors/Patient Goals Review   Personal Goals Review Weight Management/Obesity;Improve shortness of breath with ADL's;Develop more efficient breathing techniques such as purse lipped breathing and diaphragmatic breathing and practicing self-pacing with activity.;Stress;Tobacco Cessation       Review Luis [name] was discharged from the program on 12/12/23,  he completed 1 session since 11/02/23. Discharge review of patient's Core Components/Risk Factors/Patient Goals are as follows: Goals not met. Luis [name] completed 1 class.       Expected Outcomes Pt will show progress toward meeting expected goals and outcomes post pulm rehab          Exercise Goals and Review:  Exercise Goals     Row Name 10/25/23 1340             Exercise Goals   Increase Physical Activity Yes       Intervention Provide advice, education, support and counseling about physical activity/exercise needs.;Develop an individualized exercise prescription for aerobic and resistive training based on initial evaluation findings, risk stratification, comorbidities and participant's personal goals.       Expected Outcomes Short Term: Attend rehab on a regular basis to increase amount of physical activity.;Long Term: Exercising regularly at least 3-5 days a  week.;Long Term: Add in home exercise to make exercise part of routine and to increase amount of physical activity.       Increase Strength and Stamina Yes       Intervention Provide advice, education, support and counseling about physical activity/exercise needs.;Develop an individualized exercise prescription for aerobic and resistive training based on initial evaluation findings, risk stratification, comorbidities and participant's personal goals.       Expected Outcomes Short Term: Increase workloads from initial exercise prescription for resistance, speed, and METs.;Short Term: Perform resistance training exercises routinely during rehab and add in resistance training at home;Long Term: Improve cardiorespiratory fitness, muscular endurance and strength as measured by increased METs and functional capacity ( )       Able to understand and use rate of perceived exertion (RPE) scale Yes       Intervention Provide education and explanation on how to use RPE scale       Expected Outcomes Short Term: Able to use RPE daily in rehab to express subjective intensity level;Long Term:  Able to use RPE to guide intensity level when exercising independently       Able to understand and use Dyspnea scale Yes       Intervention Provide education and explanation on how to use Dyspnea scale       Expected Outcomes Short Term: Able to use Dyspnea scale daily in rehab to express subjective sense of shortness of breath during exertion;Long Term: Able to use Dyspnea scale to guide intensity level when exercising independently       Knowledge and understanding of Target Heart Rate Range (THRR) Yes       Intervention Provide education and explanation of THRR including how the numbers were predicted and where they are located for reference       Expected Outcomes Short Term: Able to state/look up THRR;Long Term: Able to use THRR to govern intensity when exercising independently;Short Term: Able to use daily as guideline  for intensity in rehab       Understanding of Exercise Prescription Yes       Intervention Provide education, explanation, and written materials on patient's individual exercise prescription       Expected Outcomes Short Term: Able to explain program exercise prescription;Long Term: Able to explain home exercise prescription to exercise independently          Exercise Goals Re-Evaluation:  Exercise Goals Re-Evaluation     Row Name 11/20/23 0731 12/12/23 1605           Exercise Goal Re-Evaluation   Exercise Goals Review  Increase Physical Activity;Able to understand and use Dyspnea scale;Understanding of Exercise Prescription;Increase Strength and Stamina;Knowledge and understanding of Target Heart Rate Range (THRR);Able to understand and use rate of perceived exertion (RPE) scale Increase Physical Activity;Able to understand and use Dyspnea scale;Understanding of Exercise Prescription;Increase Strength and Stamina;Knowledge and understanding of Target Heart Rate Range (THRR);Able to understand and use rate of perceived exertion (RPE) scale      Comments Pt has completed 1 day of group exercise. He missed 4 sessions for illness. He ambulated 29 min with rests after every lap, 1505 ft, METs 1.44, with rollator. He is significantly deconditioned. Will encourage decreasing length of rest breaks. Pt needed to sit for majority of cool down. Does leg extensions instead of squats.Used red bands, 3. 7 lbs. Will work on progression. Pt completed one day of exercise and did not return despite phone calls. Discharged today.      Expected Outcomes Through exercise at rehab and home, the patient will decrease shortness of breath with daily activities and feel confident in carrying out an exercise regimen at home. Through exercise at rehab and home, the patient will decrease shortness of breath with daily activities and feel confident in carrying out an exercise regimen at home.         Nutrition & Weight -  Outcomes:    Nutrition:  Nutrition Therapy & Goals - 12/12/23 1434       Nutrition Therapy   Diet General Healthy Diet      Personal Nutrition Goals   Nutrition Goal Patient to improve diet quality by using the plate method as a guide for meal planning to include lean protein/plant protein, fruits, vegetables, whole grains, nonfat dairy as part of a well-balanced diet.    Personal Goal #2 Patient to identify strategies for blood sugar control with goal A1c <7%    Personal Goal #3 Patient to identify strategies for weight loss with goal of 0.5-2.0# per week.    Comments Goals not met. Luis [name] has attended 1 pulmonary rehab session. Luis [name] has medical history of HTN, CHF, COPD, DM2, diverticulitis s/p colectomy with ileostomy, alchoholism (triglycerides 522). He continues to smoke. He continues follow-up with ostomy clinic for ostomy wound/nonhealing abdominal wound; last seen on 10/31/23. Per ostomy clinic notes, He underwent abdominal surgery in 2021 for treatment of a perforated abscess diverticula.  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  He also has ileostomy which he would like to have reversed at some point, but reversal has been delayed due to the nonhealing wound as well as his obesity and current chronic use of tobacco. Per [facility] surgery on 08/29/23, he is not considered a surgical candidate for ileostomy takedown and abdominal wall reconstruction at this time but was referred to [facility] in [place] and [facility] in [place]. A1c is not at goal. He is motivated to lose weight; will continue to address strategies for weight loss including calorie density, the plate method as a guide for meal planning, etc. Patient will benefit from participation in intensive cardiac rehab for nutrition education, exercise, and lifestyle modification.      Intervention Plan   Intervention Prescribe, educate and counsel regarding individualized specific  dietary modifications aiming towards targeted core components such as weight, hypertension, lipid management, diabetes, heart failure and other comorbidities.;Nutrition handout(s) given to patient.    Expected Outcomes Short Term Goal: Understand basic principles of dietary content, such as calories, fat, sodium, cholesterol and nutrients.;Long Term  Goal: Adherence to prescribed nutrition plan.          Nutrition Discharge:   Education Questionnaire Score:  Knowledge Questionnaire Score - 10/25/23 1546       Knowledge Questionnaire Score   Pre Score 12/18         Luis [name] has been discharged from the the program on 12/12/23 due to ongoing medical issues. He attended 1 session since 5/14. We have been unable to reach Luis [name] for a discharge psy/soc re-evaluation.   Discharge review of patient's Core Components/Risk Factors/Patient Goals are as follows: Goals not met. Luis [name] completed 1 class.

## 2023-12-13 NOTE — Progress Notes (Signed)
 RT assisted with transport of this pt from 6E to 2H while on BiPAP. Pt tolerated well with SVS. 2H RT notified.

## 2023-12-13 NOTE — Progress Notes (Addendum)
 [facility], hospitalist service, overnight cross coverage.    Received a call from bedside regarding the patient becoming hypoxic with a change in mental status.  Presented at bedside.  The patient placed on BiPAP.  ABG drawn.  Suspect his encephalopathy is likely secondary to hypercarbia.  He was given Ativan  for elevated CIWA score.  Reviewed chest x-ray done in the ER showing cardiomegaly with interstitial edema and trace right pleural effusion.  Received a dose of IV Lasix  last night and again this morning.  Mild diffuse Rales noted on exam.  Abdomen is distended with a foul-smelling chronic wound.  Peripheral edema.  Follow-up on ABG.  The patient will need reassessment.  Rapid response team, Alm Burner, was also present at bedside.  Rapid response will follow-up on the patient.  Highly appreciate rapid response assistance.   Time: 15 minutes

## 2023-12-14 ENCOUNTER — Inpatient Hospital Stay (HOSPITAL_COMMUNITY)

## 2023-12-14 ENCOUNTER — Encounter (HOSPITAL_COMMUNITY)

## 2023-12-14 DIAGNOSIS — I10 Essential (primary) hypertension: Secondary | ICD-10-CM | POA: Diagnosis not present

## 2023-12-14 DIAGNOSIS — I509 Heart failure, unspecified: Secondary | ICD-10-CM | POA: Diagnosis not present

## 2023-12-14 DIAGNOSIS — J449 Chronic obstructive pulmonary disease, unspecified: Secondary | ICD-10-CM | POA: Diagnosis not present

## 2023-12-14 DIAGNOSIS — G9341 Metabolic encephalopathy: Secondary | ICD-10-CM | POA: Diagnosis not present

## 2023-12-14 DIAGNOSIS — N179 Acute kidney failure, unspecified: Secondary | ICD-10-CM | POA: Diagnosis not present

## 2023-12-14 DIAGNOSIS — J441 Chronic obstructive pulmonary disease with (acute) exacerbation: Secondary | ICD-10-CM | POA: Diagnosis not present

## 2023-12-14 DIAGNOSIS — J9602 Acute respiratory failure with hypercapnia: Secondary | ICD-10-CM | POA: Diagnosis not present

## 2023-12-14 DIAGNOSIS — J9601 Acute respiratory failure with hypoxia: Secondary | ICD-10-CM | POA: Diagnosis not present

## 2023-12-14 DIAGNOSIS — R0989 Other specified symptoms and signs involving the circulatory and respiratory systems: Secondary | ICD-10-CM | POA: Diagnosis not present

## 2023-12-14 DIAGNOSIS — F17210 Nicotine dependence, cigarettes, uncomplicated: Secondary | ICD-10-CM | POA: Diagnosis not present

## 2023-12-14 DIAGNOSIS — Z7689 Persons encountering health services in other specified circumstances: Secondary | ICD-10-CM | POA: Diagnosis not present

## 2023-12-14 DIAGNOSIS — I5033 Acute on chronic diastolic (congestive) heart failure: Secondary | ICD-10-CM | POA: Diagnosis not present

## 2023-12-14 DIAGNOSIS — E119 Type 2 diabetes mellitus without complications: Secondary | ICD-10-CM | POA: Diagnosis not present

## 2023-12-14 LAB — BASIC METABOLIC PANEL WITH GFR
Anion gap: 11 (ref 5–15)
BUN: 34 mg/dL — ABNORMAL HIGH (ref 8–23)
CO2: 29 mmol/L (ref 22–32)
Calcium: 8.8 mg/dL — ABNORMAL LOW (ref 8.9–10.3)
Chloride: 98 mmol/L (ref 98–111)
Creatinine, Ser: 1.29 mg/dL — ABNORMAL HIGH (ref 0.61–1.24)
GFR, Estimated: >60 mL/min (ref >60)
Glucose, Bld: 102 mg/dL — ABNORMAL HIGH (ref 70–99)
Potassium: 4.2 mmol/L (ref 3.5–5.1)
Sodium: 138 mmol/L (ref 135–145)

## 2023-12-14 LAB — GLUCOSE, CAPILLARY
Glucose-Capillary: 103 mg/dL — ABNORMAL HIGH (ref 70–99)
Glucose-Capillary: 142 mg/dL — ABNORMAL HIGH (ref 70–99)
Glucose-Capillary: 187 mg/dL — ABNORMAL HIGH (ref 70–99)
Glucose-Capillary: 234 mg/dL — ABNORMAL HIGH (ref 70–99)
Glucose-Capillary: 208 mg/dL — ABNORMAL HIGH (ref 70–99)

## 2023-12-14 LAB — CBC
HCT: 38.2 % — ABNORMAL LOW (ref 39.0–52.0)
Hemoglobin: 12 g/dL — ABNORMAL LOW (ref 13.0–17.0)
MCH: 34.9 pg — ABNORMAL HIGH (ref 26.0–34.0)
MCHC: 31.4 g/dL (ref 30.0–36.0)
MCV: 111 fL — ABNORMAL HIGH (ref 80.0–100.0)
Platelets: 131 10*3/uL — ABNORMAL LOW (ref 150–400)
RBC: 3.44 MIL/uL — ABNORMAL LOW (ref 4.22–5.81)
RDW: 18.1 % — ABNORMAL HIGH (ref 11.5–15.5)
WBC: 7.4 10*3/uL (ref 4.0–10.5)
nRBC: 0 % (ref 0.0–0.2)

## 2023-12-14 LAB — MAGNESIUM: Magnesium: 1.9 mg/dL (ref 1.7–2.4)

## 2023-12-14 MED ORDER — HYDROXYZINE HCL 25 MG PO TABS
50.0000 mg | ORAL_TABLET | Freq: Every evening | ORAL | Status: DC | PRN
  Administered 2023-12-14 – 2023-12-15 (×3): 50 mg via ORAL
  Filled 2023-12-14 (×3): qty 2

## 2023-12-14 MED ORDER — METHYLPREDNISOLONE SODIUM SUCC 40 MG IJ SOLR
40.0000 mg | Freq: Two times a day (BID) | INTRAMUSCULAR | Status: DC
  Administered 2023-12-14 (×2): 40 mg via INTRAVENOUS
  Filled 2023-12-14 (×2): qty 1

## 2023-12-14 MED ORDER — SODIUM CHLORIDE 0.9% FLUSH: 10.0000 mL | INTRAVENOUS | Status: DC | PRN

## 2023-12-14 MED ORDER — GUAIFENESIN ER 600 MG PO TB12
600.0000 mg | ORAL_TABLET | Freq: Two times a day (BID) | ORAL | Status: DC
  Administered 2023-12-14 – 2023-12-16 (×5): 600 mg via ORAL
  Filled 2023-12-14 (×5): qty 1

## 2023-12-14 MED ORDER — ACETAZOLAMIDE 250 MG PO TABS
500.0000 mg | ORAL_TABLET | Freq: Two times a day (BID) | ORAL | Status: AC
  Administered 2023-12-14 – 2023-12-15 (×4): 500 mg via ORAL
  Filled 2023-12-14 (×4): qty 2

## 2023-12-14 MED ORDER — GABAPENTIN 300 MG PO CAPS
300.0000 mg | ORAL_CAPSULE | Freq: Two times a day (BID) | ORAL | Status: DC
  Administered 2023-12-14 – 2023-12-16 (×6): 300 mg via ORAL
  Filled 2023-12-14 (×6): qty 1

## 2023-12-14 MED ORDER — ACETAZOLAMIDE SODIUM 500 MG IJ SOLR: 500.0000 mg | Freq: Two times a day (BID) | INTRAMUSCULAR | Status: DC

## 2023-12-14 NOTE — TOC Initial Note (Signed)
 Transition of Care [facility]) - Initial/Assessment Note    Patient Details  Name: Luis [name] MRN: [id number] Date of Birth: 20-Jan-1961  Transition of Care [facility]) CM/SW Contact:    Sudie Erminio Deems, RN Phone Number: 12/14/2023, 3:53 PM  Clinical Narrative: Risk for readmission assessment completed. Patient presented for increased shortness of breath and swelling. PTA patient states he was from home alone and has support of a sister in [place]. Patient has DME rolling walker and nebulizer machine in the home. Patient states he has transportation via Medicaid. Patient discussed that his water heater bursted and his home is a mess. He states his sister is working on cleaning the home. Patient gets groceries delivered from [place]. Patient states he does his ostomy care and goes to the wound care clinic for additional education and reinforcement. Patient will benefit from PT/OT consult for recommendations. Patient states he is dealing with gout and neuropathy and it has been hard for him to walk. Case Manager will follow for disposition and DME needs as the patient progresses.                 Expected Discharge Plan: Skilled Nursing Facility Barriers to Discharge: Continued Medical Work up   Patient Goals and CMS Choice Patient states their goals for this hospitalization and ongoing recovery are:: Patient wants to feel better and be able to walk without numbess and tingling in feet.    Expected Discharge Plan and Services   Discharge Planning Services: CM Consult   Living arrangements for the past 2 months:  (town home)    Prior Living Arrangements/Services Living arrangements for the past 2 months:  (town home) Lives with:: Self (has support of his sister in [place].)   Do you feel safe going back to the place where you live?:  (Patients water heater bursted and sister is helping with the home clean up.)          Current home services: DME (rolling walker and nebulizer  machine.)    Activities of Daily Living   ADL Screening (condition at time of admission) Independently performs ADLs?: Yes (appropriate for developmental age) Is the patient deaf or have difficulty hearing?: No Does the patient have difficulty seeing, even when wearing glasses/contacts?: No Does the patient have difficulty concentrating, remembering, or making decisions?: No  Permission Sought/Granted Permission sought to share information with : Family Supports, Case Manager                Emotional Assessment   Attitude/Demeanor/Rapport: Engaged Affect (typically observed): Appropriate Orientation: : Oriented to Self, Oriented to Place      Admission diagnosis:  Hypoxia [R09.02] Acute on chronic diastolic CHF (congestive heart failure) (HCC) [I50.33] Pneumonia due to infectious organism, unspecified laterality, unspecified part of lung [J18.9] Acute on chronic congestive heart failure, unspecified heart failure type [facility]) [I50.9] Patient Active Problem List   Diagnosis Date Noted   Acute on chronic diastolic CHF (congestive heart failure) (HCC) 12/12/2023   Acute renal failure superimposed on stage 3a chronic kidney disease (HCC) 12/12/2023   Long QT interval 10/11/2023   Chronic obstructive pulmonary disease (HCC) 10/05/2023   Allergic urticaria 08/29/2023   Rash 08/24/2023   Chronic combined systolic and diastolic heart failure (HCC) 08/24/2023   Pneumonia due to infectious organism 08/24/2023   Frequency of micturition 08/24/2023   CAP (community acquired pneumonia) 08/06/2023   Elevated troponin 08/06/2023   Lactic acidosis 08/06/2023   Allergic rhinitis 08/06/2023   At risk for falling 03/02/2023  Shortness of breath 03/02/2023   Needs flu shot 03/02/2023   HLD (hyperlipidemia) 02/19/2023   Demand ischemia (HCC) 02/19/2023   COPD with acute exacerbation (HCC) 02/16/2023   Acute exacerbation of CHF (congestive heart failure) (HCC) 02/15/2023   Type 2  diabetes mellitus with diabetic polyneuropathy, without long-term current use of insulin  (HCC) 01/20/2023   Chronic diastolic CHF (congestive heart failure) (HCC) 01/20/2023   Acute respiratory failure with hypoxia (HCC) 12/23/2022   Acute on chronic heart failure with preserved ejection fraction (HFpEF) (HCC) 12/23/2022   Encounter for smoking cessation counseling 12/09/2022   Prostate cancer screening 12/09/2022   Alcoholism (HCC) 12/09/2022   Abdominal wall hernia at previous stoma site 12/02/2022   Nonhealing surgical wound, subsequent encounter 11/24/2022   Abdominal hernia without obstruction and without gangrene 11/22/2022   Class 1 obesity with serious comorbidity and body mass index (BMI) of 32.0 to 32.9 in adult 11/04/2022   Insomnia 11/04/2022   Elevated hemoglobin (HCC) 11/04/2022   Smoker 11/04/2022   Irritant contact dermatitis associated with digestive stoma 11/02/2022   Ileostomy prolapse (HCC) 08/18/2022   Ileostomy in place [facility]) 08/08/2022   Bilateral inguinal hernia without obstruction or gangrene 08/08/2022   Surgical wound, non healing 04/26/2022   Ileostomy dysfunction (HCC) 04/12/2022   Non-recurrent bilateral inguinal hernia without obstruction or gangrene 03/22/2022   Incisional hernia, without obstruction or gangrene    Nonhealing nonsurgical wound    Ileostomy stenosis (HCC)    Irritant contact dermatitis associated with fecal stoma    Ileostomy care (HCC)    Pacemaker    AV block, 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 [place] [address] Phone: [phone] Fax: [phone]  [name] [address] [place] [address] Phone: [phone] Fax: [phone]     Social Drivers of Health (SDOH) Social History: SDOH Screenings   Food Insecurity: No Food Insecurity (10/12/2023)  Housing: Low Risk  (10/12/2023)  Transportation Needs: Unmet Transportation Needs (10/12/2023)  Utilities: Not At Risk (10/12/2023)  Depression (PHQ2-9): Medium Risk (10/25/2023)  Stress: Stress Concern Present (04/18/2023)  Tobacco Use: High Risk (12/13/2023)   SDOH Interventions:     Readmission Risk Interventions    12/14/2023    3:50 PM  Readmission Risk Prevention Plan  Transportation Screening Complete  HRI or Home Care Consult Complete  Social Work Consult for Recovery Care Planning/Counseling Complete  Palliative Care Screening Not Applicable  Medication Review Oceanographer) Referral to Pharmacy

## 2023-12-14 NOTE — Plan of Care (Signed)
  Problem: Coping: Goal: Ability to adjust to condition or change in health will improve Outcome: Progressing   Problem: Fluid Volume: Goal: Ability to maintain a balanced intake and output will improve Outcome: Progressing   Problem: Health Behavior/Discharge Planning: Goal: Ability to identify and utilize available resources and services will improve Outcome: Progressing Goal: Ability to manage health-related needs will improve Outcome: Progressing   Problem: Metabolic: Goal: Ability to maintain appropriate glucose levels will improve Outcome: Progressing   Problem: Nutritional: Goal: Maintenance of adequate nutrition will improve Outcome: Progressing

## 2023-12-14 NOTE — Progress Notes (Signed)
 NAME:  Luis [name], MRN:  [id number], DOB:  05-Sep-1960, LOS: 2 ADMISSION DATE:  12/12/2023, CONSULTATION DATE:  12/13/23 REFERRING MD:  Dr. Michele, CHIEF COMPLAINT:  AMS   History of Present Illness:  Pt encephalopathic, therefore HPI obtained from EMR review.  11 yoM with PMH of HTN, HFpEF, CHB s/p PPM, DMT2, COPD, tobacco abuse, ETOH, cirrhosis, diverticulitis s/p RLQ ileostomy with chronic non-healing abd wound followed OP by [facility] who presented 7/1 with SOB and worsening LE edema.  Saturations in low 90's on RA on admit, afebrile, reportedly no change in chronic cough.  Reported ETOH use 4 drinks a week with ongoing tobacco and marijuana use.  CXR showed Insterstitial edema and patchy airspace opacities in bases. Labs showed Na 133 BUN/ sCr 56/ 1.89, trop 39> 38, BNP 239, PCT 0.75, WBC 10.5.  Admitted to [facility] with HF exacerbation, AKI, pulmonary edema +/- r/o PNA.    Was given ativan  1mg  around 0039, with progressive hypoxia and increasing O2 needs overnight.  ABG 7.26/ 68/ 65/ 30, placed on BiPAP at shift change with ongoing lasix .  Since mental status remains poor and no significant improvement on ABG 7.28/ 72/ 110/ 33.  PCCM consulted for further evaluation.   Pertinent  Medical History   Past Medical History:  Diagnosis Date   Acute respiratory failure with hypoxia (HCC) 12/23/2022   Alcohol withdrawal syndrome, with delirium (HCC) 12/27/2022   COPD (chronic obstructive pulmonary disease) (HCC)    Diabetes mellitus without complication (HCC)    Diverticulitis    DVT (deep venous thrombosis) (HCC)    x3   Encephalopathy acute 12/28/2022   ETOH abuse    H/O colectomy    Hyperlipidemia    Hypertension    Sepsis associated hypotension (HCC) 08/03/2019   Smoker    Significant Hospital Events: Including procedures, antibiotic start and stop dates in addition to other pertinent events     Interim History / Subjective:  Bipap overnight, off this am. Stable resp satus on 8-10L [place].   C/o L foot gout pain  Wants to eat   Objective    Blood pressure 128/66, pulse 78, temperature 99.9 F (37.7 C), resp. rate (!) 21, height 6' 1 (1.854 m), weight 113.8 kg, SpO2 96%.    Vent Mode: BIPAP;PCV FiO2 (%):  [70 %] 70 % Set Rate:  [18 bmp] 18 bmp PEEP:  [5 cmH20] 5 cmH20   Intake/Output Summary (Last 24 hours) at 12/14/2023 0925 Last data filed at 12/14/2023 0900 Gross per 24 hour  Intake 2216.46 ml  Output 4815 ml  Net -2598.54 ml   Filed Weights   12/13/23 0340 12/13/23 1340 12/14/23 0500  Weight: 113.3 kg 116.1 kg 113.8 kg    Examination: General:  chronically ill appearing male, NAD on [place]  HEENT: pupils 3/r, anicteric  Neuro: awake, alert, appropriate, MAE  CV: s1s2 rrr,  PULM:  resps even non labored on 10L [place], coarse, intermittent exp wheeze throughout, coughing  GI: obese, +bs, R ostomy, large chronic appearing superficial wounds to lower abd with dressing Extremities: warm/dry, +1-2 pedal edema, L foot/L great toe very tender  Skin: no rashes   Resolved problem list   Assessment and Plan   Acute hypoxic and hypercarbic respiratory  R/o PNA, pulmonary edema  ?AECOPD Tobacco Abuse  - continue to monitor in ICU this morning just off bipap still requiring 10L  - goal sat > 92% - aggressive diureses as below - empiric CAP coverage - prn CXR - triple  therapy nebs with prn albuterol .   -will add low dose systemic steroids for now with some wheezing, will also help gout. Taper quickly as resp status improves  - trend PCT/ fever/ WBC curve   Acute metabolic encephalopathy  ETOH abuse - reported 4 drinks per week on admit? - EEG  - serial neuro exams - continue CIWA w/ ativan  for now, monitor closely for withdrawal.  Consider precedex  if needed  - thiamine   - neuro protective measures  - possible infectious treatment as above - avoid sedating meds  Acute on chronic HFpEF exacerbation Hypertension CHB s/p PPM - TTE 12/2022 EF 50-55%, G2DD -  baseline wt around 105kg, currently 113kg - remains NPO, normotensive, cont to hold metoprolol   - aggressive diureses  - continue holding GDMT> losartan , farxiga  - optimize electrolytes   AKI - improving - baseline sCr 1.2-1.5 - monitor closely with diuresis  - strict I/Os, daily wts - avoid nephrotoxins, renal dose meds, hemodynamic support as above   DMT2 - previous A1c 07/2023 7.3 - change to CBG q4 prn/ SSI prn   Hx RLQ ileostomy  Chronic abd wound - followed outpt by [facility] - cont abx as above - WOC RN following   Acute gout  P:  Low dose systemic steroids as above for AECOPD, gout  Holding colchicine  with AKI  PT/OT   Obesity - BMI 32.96 kg/ m2  Monitor in ICU today for resp status, mental status, O2 needs   Best Practice (right click and Reselect all SmartList Selections daily)   Diet/type: Regular consistency (see orders) DVT prophylaxis prophylactic heparin   Pressure ulcer(s): present on admission - chronic abd wound GI prophylaxis: N/A Lines: N/A Foley:  N/A Code Status:  full code Last date of multidisciplinary goals of care discussion [pending]  7/3 updated pt at length, d/w bedside RN. No family at bedside   Labs   CBC: Recent Labs  Lab 12/12/23 1600 12/13/23 0426 12/14/23 0444  WBC 10.5 8.6 7.4  NEUTROABS 7.8*  --   --   HGB 13.3 11.9* 12.0*  HCT 40.8 36.9* 38.2*  MCV 107.9* 108.8* 111.0*  PLT 208 167 131*    Basic Metabolic Panel: Recent Labs  Lab 12/12/23 1600 12/13/23 0426 12/14/23 0444  NA 133* 137 138  K 4.9 4.5 4.2  CL 93* 97* 98  CO2 24 27 29   GLUCOSE 122* 128* 102*  BUN 55* 48* 34*  CREATININE 1.89* 1.48* 1.29*  CALCIUM  9.8 8.9 8.8*  MG  --  2.3 1.9   GFR: Estimated Creatinine Clearance: 76.5 mL/min (A) (by C-G formula based on SCr of 1.29 mg/dL (H)). Recent Labs  Lab 12/12/23 1600 12/13/23 0426 12/14/23 0444  PROCALCITON  --  0.75  --   WBC 10.5 8.6 7.4    Liver Function Tests: Recent Labs  Lab  12/12/23 1600  AST 45*  ALT 30  ALKPHOS 114  BILITOT 1.2  PROT 8.0  ALBUMIN 4.0   No results for input(s): LIPASE, AMYLASE in the last 168 hours. Recent Labs  Lab 12/13/23 1433  AMMONIA 36*    ABG    Component Value Date/Time   PHART 7.28 (L) 12/13/2023 1040   PCO2ART 72 (HH) 12/13/2023 1040   PO2ART 110 (H) 12/13/2023 1040   HCO3 33.4 (H) 12/13/2023 1040   TCO2 26 12/25/2022 2309   ACIDBASEDEF 1.0 12/25/2022 2309   O2SAT 99.4 12/13/2023 1040     Coagulation Profile: No results for input(s): INR, PROTIME in the last 168  hours.  Cardiac Enzymes: No results for input(s): CKTOTAL, CKMB, CKMBINDEX, TROPONINI in the last 168 hours.  HbA1C: Hgb A1c MFr Bld  Date/Time Value Ref Range Status  08/06/2023 11:15 PM 7.3 (H) 4.8 - 5.6 % Final    Comment:    (NOTE) Pre diabetes:          5.7%-6.4%  Diabetes:              >6.4%  Glycemic control for   <7.0% adults with diabetes   02/16/2023 04:23 AM 7.0 (H) 4.8 - 5.6 % Final    Comment:    (NOTE) Pre diabetes:          5.7%-6.4%  Diabetes:              >6.4%  Glycemic control for   <7.0% adults with diabetes     CBG: Recent Labs  Lab 12/13/23 1545 12/13/23 2135 12/13/23 2331 12/14/23 0354 12/14/23 0715  GLUCAP 115* 117* 203* 103* 142*     Rockie Myers, NP Pulmonary/Critical Care Medicine  12/14/2023  9:25 AM   See Tracey for personal pager PCCM on call pager [phone] until 7pm. Please call Elink 7p-7a. [phone]

## 2023-12-15 DIAGNOSIS — I5033 Acute on chronic diastolic (congestive) heart failure: Secondary | ICD-10-CM | POA: Diagnosis not present

## 2023-12-15 LAB — GLUCOSE, CAPILLARY
Glucose-Capillary: 182 mg/dL — ABNORMAL HIGH (ref 70–99)
Glucose-Capillary: 183 mg/dL — ABNORMAL HIGH (ref 70–99)
Glucose-Capillary: 183 mg/dL — ABNORMAL HIGH (ref 70–99)
Glucose-Capillary: 188 mg/dL — ABNORMAL HIGH (ref 70–99)
Glucose-Capillary: 229 mg/dL — ABNORMAL HIGH (ref 70–99)

## 2023-12-15 LAB — BASIC METABOLIC PANEL WITH GFR
Anion gap: 15 (ref 5–15)
BUN: 32 mg/dL — ABNORMAL HIGH (ref 8–23)
CO2: 22 mmol/L (ref 22–32)
Calcium: 9.3 mg/dL (ref 8.9–10.3)
Chloride: 102 mmol/L (ref 98–111)
Creatinine, Ser: 1.25 mg/dL — ABNORMAL HIGH (ref 0.61–1.24)
GFR, Estimated: >60 mL/min (ref >60)
Glucose, Bld: 183 mg/dL — ABNORMAL HIGH (ref 70–99)
Potassium: 3.4 mmol/L — ABNORMAL LOW (ref 3.5–5.1)
Sodium: 139 mmol/L (ref 135–145)

## 2023-12-15 LAB — CBC
HCT: 38.6 % — ABNORMAL LOW (ref 39.0–52.0)
Hemoglobin: 12.2 g/dL — ABNORMAL LOW (ref 13.0–17.0)
MCH: 34.2 pg — ABNORMAL HIGH (ref 26.0–34.0)
MCHC: 31.6 g/dL (ref 30.0–36.0)
MCV: 108.1 fL — ABNORMAL HIGH (ref 80.0–100.0)
Platelets: 151 K/uL (ref 150–400)
RBC: 3.57 MIL/uL — ABNORMAL LOW (ref 4.22–5.81)
RDW: 17.3 % — ABNORMAL HIGH (ref 11.5–15.5)
WBC: 7.9 K/uL (ref 4.0–10.5)
nRBC: 0 % (ref 0.0–0.2)

## 2023-12-15 LAB — MAGNESIUM: Magnesium: 1.8 mg/dL (ref 1.7–2.4)

## 2023-12-15 MED ORDER — METHYLPREDNISOLONE SODIUM SUCC 40 MG IJ SOLR
40.0000 mg | INTRAMUSCULAR | Status: DC
  Administered 2023-12-15: 40 mg via INTRAVENOUS
  Filled 2023-12-15: qty 1

## 2023-12-15 MED ORDER — WHITE PETROLATUM EX OINT
TOPICAL_OINTMENT | CUTANEOUS | Status: DC | PRN
  Filled 2023-12-15: qty 28.35

## 2023-12-15 MED ORDER — INSULIN ASPART 100 UNIT/ML IJ SOLN
0.0000 [IU] | Freq: Three times a day (TID) | INTRAMUSCULAR | Status: DC
  Administered 2023-12-15: 3 [IU] via SUBCUTANEOUS
  Administered 2023-12-15: 2 [IU] via SUBCUTANEOUS
  Administered 2023-12-16 (×2): 3 [IU] via SUBCUTANEOUS

## 2023-12-15 NOTE — Progress Notes (Signed)
 Pt is not compliant with safety measures despite repeated education. Pt continues to get up as he wishes without regard to pulling at foley, O2, and tele leads. Pt is not steady on feet and will get up without using walker or calling for help. Safety education reminded. Bed alarm on.

## 2023-12-15 NOTE — Progress Notes (Signed)
 VAT: troubleshoot midline.  Midline flushed w/o resistance, blood return noted, Care RN at bedside during assessment.  Consult complete.

## 2023-12-15 NOTE — Evaluation (Signed)
 Occupational Therapy Evaluation Patient Details Name: Luis [name] MRN: [id number] DOB: 18-Jul-1960 Today's Date: 12/15/2023   History of Present Illness   Pt is a 63 year old man admitted on 12/12/23 with shortness of breath, LE swelling and cough due to CHF exacerbation. Hospital course complicated by respiratory failure requiring Bipap. PMH: ileostomy with chronic abdominal wound, COPD not on home O2, DM, DVT, PCM, morbid obesity, cirrhosis, alcohol abuse.     Clinical Impressions Pt is typically independent in self care. He reports intermittently reaching for furniture with ambulation and has a prescription from his PCP for a cane. He was needing to use his RW in the days leading up to admission. Pt reports having a tub seat which he worries will put a hole in his tub. Pt presents with decreased attention and awareness of deficits and safety. No family available to determine baseline. Pt reports gout pain in his R foot, benefits from use of RW. He requires supervision to [facility] for mobility and set up to [facility] for ADLs. SpO2 noted to drop to 87% on RA while blowing his nose, replaced 7L O2 with SpO2 of 93%. Pt does not think he needs supplemental O2. He is highly distracted by sensation of need to urinate with foley catheter.      If plan is discharge home, recommend the following:   Assist for transportation;Assistance with cooking/housework     Functional Status Assessment   Patient has had a recent decline in their functional status and demonstrates the ability to make significant improvements in function in a reasonable and predictable amount of time.     Equipment Recommendations   None recommended by OT     Recommendations for Other Services         Precautions/Restrictions   Precautions Precautions: Fall Recall of Precautions/Restrictions: Impaired Restrictions Weight Bearing Restrictions Per Provider Order: No     Mobility Bed Mobility Overal bed mobility: Needs  Assistance Bed Mobility: Supine to Sit     Supine to sit: Supervision     General bed mobility comments: supervision for safety and lines, HOB up slightly    Transfers Overall transfer level: Needs assistance Equipment used: Rolling walker (2 wheels) Transfers: Sit to/from Stand Sit to Stand: Contact guard assist           General transfer comment: wide base of support, cues for hand placement      Balance Overall balance assessment: Needs assistance   Sitting balance-Leahy Scale: Good       Standing balance-Leahy Scale: Fair Standing balance comment: can release walker in static standing, benefits from walker due to R foot pain                           ADL either performed or assessed with clinical judgement   ADL Overall ADL's : Needs assistance/impaired Eating/Feeding: Independent;Sitting   Grooming: Wash/dry hands;Standing;Contact guard assist   Upper Body Bathing: Set up;Sitting   Lower Body Bathing: Contact guard assist;Sit to/from stand   Upper Body Dressing : Set up;Sitting   Lower Body Dressing: Contact guard assist;Sit to/from stand   Toilet Transfer: Contact guard assist;Ambulation;Rolling walker (2 wheels)           Functional mobility during ADLs: Contact guard assist;Rolling walker (2 wheels)       Vision Baseline Vision/History: 1 Wears glasses Ability to See in Adequate Light: 0 Adequate Patient Visual Report: No change from baseline  Perception         Praxis         Pertinent Vitals/Pain Pain Assessment Pain Assessment: Faces Faces Pain Scale: Hurts little more Pain Location: R foot-gout flare? Pain Descriptors / Indicators: Discomfort, Grimacing, Guarding Pain Intervention(s): Monitored during session, Repositioned     Extremity/Trunk Assessment Upper Extremity Assessment Upper Extremity Assessment: Overall WFL for tasks assessed   Lower Extremity Assessment Lower Extremity Assessment: Defer to  PT evaluation   Cervical / Trunk Assessment Cervical / Trunk Assessment: Other exceptions Cervical / Trunk Exceptions: chronic abdominal wound with dressing, ostomy   Communication Communication Communication: No apparent difficulties   Cognition Arousal: Alert Behavior During Therapy: Restless Cognition: No family/caregiver present to determine baseline             OT - Cognition Comments: pt distracted by discomfort of foley catheter, multiple lines, poor attention                 Following commands: Impaired Following commands impaired: Follows one step commands inconsistently, Follows one step commands with increased time     Cueing  General Comments          Exercises     Shoulder Instructions      Home Living Family/patient expects to be discharged to:: Private residence Living Arrangements: Alone Available Help at Discharge: Family;Available PRN/intermittently Type of Home: Other(Comment) (town home) Home Access: Level entry     Home Layout: Two level;1/2 bath on main level;Bed/bath upstairs Alternate Level Stairs-Number of Steps: flight Alternate Level Stairs-Rails: Right Bathroom Shower/Tub: Sponge bathes at baseline   Bathroom Toilet: Handicapped height     Home Equipment: Rollator (4 wheels);Other (comment) (pt describes a shower bench his son got for him, he is worried it will put a hole in his tub)   Additional Comments: pt reports having a prescription for a cane he has not gotten yet      Prior Functioning/Environment Prior Level of Function : Independent/Modified Independent             Mobility Comments: Intermittently holds onto furniture ADLs Comments: states that he is Ind with ADLs, IADLs, drives, manages his ostomy    OT Problem List: Decreased strength;Decreased activity tolerance;Impaired balance (sitting and/or standing);Decreased cognition;Decreased safety awareness;Decreased knowledge of use of DME or  AE;Cardiopulmonary status limiting activity;Pain;Obesity   OT Treatment/Interventions: Self-care/ADL training;DME and/or AE instruction;Therapeutic activities;Cognitive remediation/compensation;Patient/family education;Balance training;Energy conservation      OT Goals(Current goals can be found in the care plan section)   Acute Rehab OT Goals OT Goal Formulation: With patient Time For Goal Achievement: 12/29/23 Potential to Achieve Goals: Good ADL Goals Pt Will Perform Grooming: with modified independence;standing Pt Will Perform Lower Body Bathing: with modified independence;sit to/from stand Pt Will Perform Lower Body Dressing: with modified independence;sit to/from stand Pt Will Transfer to Toilet: with modified independence;ambulating;bedside commode Pt Will Perform Toileting - Clothing Manipulation and hygiene: with modified independence;sit to/from stand Additional ADL Goal #1: Pt will employ pursed lip breathing and energy conservation strategies during ADLs and mobility.   OT Frequency:  Min 2X/week    Co-evaluation              AM-PAC OT 6 Clicks Daily Activity     Outcome Measure Help from another person eating meals?: None Help from another person taking care of personal grooming?: A Little Help from another person toileting, which includes using toliet, bedpan, or urinal?: A Little Help from another person bathing (including washing, rinsing,  drying)?: A Little Help from another person to put on and taking off regular upper body clothing?: A Little Help from another person to put on and taking off regular lower body clothing?: A Little 6 Click Score: 19   End of Session Equipment Utilized During Treatment: Rolling walker (2 wheels);Oxygen Nurse Communication: Other (comment) (ok to give diet coke)  Activity Tolerance: Patient tolerated treatment well Patient left: in bed;with call bell/phone within reach  OT Visit Diagnosis: Unsteadiness on feet  (R26.81);Pain;Other symptoms and signs involving cognitive function (decreased activity tolerance)                Time: 9159-9091 OT Time Calculation (min): 28 min Charges:  OT General Charges $OT Visit: 1 Visit OT Evaluation $OT Eval Moderate Complexity: 1 Mod OT Treatments $Self Care/Home Management : 8-22 mins  Mliss HERO, OTR/L Acute Rehabilitation Services Office: [phone]  Kennth Mliss Helling 12/15/2023, 9:23 AM

## 2023-12-15 NOTE — Progress Notes (Signed)
   12/14/23 2104  BiPAP/CPAP/SIPAP  Reason BIPAP/CPAP not in use Non-compliant  BiPAP/CPAP /SiPAP Vitals  Bilateral Breath Sounds Clear;Diminished   Patient refused the use of BIPAP for the evening.

## 2023-12-15 NOTE — Plan of Care (Signed)
  Problem: Nutritional: Goal: Maintenance of adequate nutrition will improve Outcome: Progressing   Problem: Education: Goal: Knowledge of General Education information will improve Description: Including pain rating scale, medication(s)/side effects and non-pharmacologic comfort measures Outcome: Progressing   Problem: Activity: Goal: Risk for activity intolerance will decrease Outcome: Progressing

## 2023-12-15 NOTE — Evaluation (Signed)
 Physical Therapy Evaluation Patient Details Name: Luis [name] MRN: [id number] DOB: [birth year] Today's Date: 12/15/2023  History of Present Illness  Pt is a 63 yo admitted on 12/12/23 with shortness of breath, LE swelling and cough due to CHF exacerbation. Hospital course complicated by respiratory failure requiring Bipap. PMH: ileostomy with chronic abdominal wound, COPD not on home O2, DM, DVT, PCM, morbid obesity, cirrhosis, alcohol abuse.  Clinical Impression  Pt is presenting close to baseline level of functioning. Currently pt is supervision or bed mobility, CGA for sit to stand and gait due to instability. Pt O2 sats dropped on 6L to 88% during gait. Due to pt current functional status, home set up and available assistance at home no recommended skilled physical therapy services at this time on discharge from acute care hospital setting. Will continue to follow in acute setting in order to ensure that pt returns home with decreased risk for falls, injury, re-hospitalization and improved activity tolerance.          If plan is discharge home, recommend the following: Help with stairs or ramp for entrance;Assistance with cooking/housework     Equipment Recommendations None recommended by PT     Functional Status Assessment Patient has had a recent decline in their functional status and demonstrates the ability to make significant improvements in function in a reasonable and predictable amount of time.     Precautions / Restrictions Precautions Precautions: Fall Recall of Precautions/Restrictions: Impaired Restrictions Weight Bearing Restrictions Per Provider Order: No      Mobility  Bed Mobility Overal bed mobility: Needs Assistance Bed Mobility: Supine to Sit, Sit to Supine     Supine to sit: Supervision Sit to supine: Supervision   General bed mobility comments: supervision for safety and lines, HOB up slightly, in recliner at end of session performed bed mobility 2x to get  catheter removed by RN    Transfers Overall transfer level: Needs assistance Equipment used: Rolling walker (2 wheels) Transfers: Sit to/from Stand Sit to Stand: Contact guard assist           General transfer comment: wide base of support, cues for hand placement    Ambulation/Gait Ambulation/Gait assistance: Supervision Gait Distance (Feet): 300 Feet Assistive device: Rolling walker (2 wheels) Gait Pattern/deviations: Step-through pattern, Wide base of support Gait velocity: mildly decreased Gait velocity interpretation: 1.31 - 2.62 ft/sec, indicative of limited community ambulator   General Gait Details: O2 sats dropped to 88% on 6L O2 via [place]      Balance Overall balance assessment: Needs assistance   Sitting balance-Leahy Scale: Good       Standing balance-Leahy Scale: Fair Standing balance comment: can release walker in static standing, poor balance without walker with high arm position for balance and increased multi directionals way           Pertinent Vitals/Pain Pain Assessment Pain Assessment: Faces Faces Pain Scale: Hurts little more Pain Location: R foot-gout flare? Pain Descriptors / Indicators: Discomfort, Grimacing, Guarding Pain Intervention(s): Monitored during session    Home Living Family/patient expects to be discharged to:: Private residence Living Arrangements: Alone Available Help at Discharge: Family;Available PRN/intermittently Type of Home: Other(Comment) (town home) Home Access: Level entry     Alternate Level Stairs-Number of Steps: flight Home Layout: Two level;1/2 bath on main level;Bed/bath upstairs Home Equipment: Rollator (4 wheels);Other (comment) (pt describes a shower bench his son got for him, he is worried it will put a hole in his tub) Additional Comments: pt reports having a  prescription for a cane he has not gotten yet    Prior Function Prior Level of Function : Independent/Modified Independent              Mobility Comments: Intermittently holds onto furniture ADLs Comments: states that he is Ind with ADLs, IADLs, drives, manages his ostomy     Extremity/Trunk Assessment   Upper Extremity Assessment Upper Extremity Assessment: Defer to OT evaluation;Overall WFL for tasks assessed    Lower Extremity Assessment Lower Extremity Assessment: Overall WFL for tasks assessed    Cervical / Trunk Assessment Cervical / Trunk Assessment: Other exceptions Cervical / Trunk Exceptions: chronic abdominal wound with dressing, ostomy  Communication   Communication Communication: No apparent difficulties    Cognition Arousal: Alert Behavior During Therapy: Restless   PT - Cognitive impairments: Orientation   Orientation impairments: Situation       Following commands: Impaired Following commands impaired: Follows one step commands inconsistently, Follows one step commands with increased time     Cueing Cueing Techniques: Verbal cues     General Comments General comments (skin integrity, edema, etc.): O2 sats dropped to 88% with gait on 6 L O2 via [place]        Assessment/Plan    PT Assessment Patient needs continued PT services  PT Problem List Decreased strength;Decreased balance;Cardiopulmonary status limiting activity       PT Treatment Interventions DME instruction;Balance training;Gait training;Stair training;Functional mobility training;Therapeutic activities;Therapeutic exercise;Patient/family education    PT Goals (Current goals can be found in the Care Plan section)  Acute Rehab PT Goals Patient Stated Goal: to return home as soon as possible PT Goal Formulation: With patient Time For Goal Achievement: 12/29/23 Potential to Achieve Goals: Fair    Frequency Min 2X/week        AM-PAC PT 6 Clicks Mobility  Outcome Measure Help needed turning from your back to your side while in a flat bed without using bedrails?: A Little Help needed moving from lying on your back  to sitting on the side of a flat bed without using bedrails?: A Little Help needed moving to and from a bed to a chair (including a wheelchair)?: A Little Help needed standing up from a chair using your arms (e.g., wheelchair or bedside chair)?: A Little Help needed to walk in hospital room?: A Little Help needed climbing 3-5 steps with a railing? : A Little 6 Click Score: 18    End of Session Equipment Utilized During Treatment: Gait belt Activity Tolerance: Patient tolerated treatment well Patient left: in chair;with call bell/phone within reach;with chair alarm set Nurse Communication: Mobility status PT Visit Diagnosis: Unsteadiness on feet (R26.81);Other abnormalities of gait and mobility (R26.89)    Time: 8991-8957 PT Time Calculation (min) (ACUTE ONLY): 34 min   Charges:   PT Evaluation $PT Eval Low Complexity: 1 Low PT Treatments $Therapeutic Activity: 8-22 mins PT General Charges $$ ACUTE PT VISIT: 1 Visit         Dorothyann Maier, DPT, CLT  Acute Rehabilitation Services Office: [phone] (Secure chat preferred)   Dorothyann VEAR Maier 12/15/2023, 10:47 AM

## 2023-12-15 NOTE — Progress Notes (Signed)
 Luis [name]  FMW:[id number] DOB: 12-Oct-1960 DOA: 12/12/2023 PCP: Elnor [name], NP    Brief Narrative:  63 year old with a history of HTN, DM2, prior DVT, COPD, chronic diastolic CHF, CHB status post pacemaker, alcohol abuse, and diverticulitis status post colectomy with complicated postoperative wound infection who presented to the ER 12/12/2023 with worsening shortness of breath and progressive lower extremity edema over several days.  At presentation he was saturating in the low 90s on room air with mild tachypnea and increased work of breathing.  CXR suggested interstitial edema with patchy airspace opacities in the bases.  Significant Events: 7/1 admit - ongoing diuresis - empiric abx  7/2 transferred to ICU due to progressive hypercarbic and hypoxic resp failure - BiPAP utilized 7/3 BiPAP titrated down to [facility] 7/4 transferred back to [facility]   Goals of Care:   Code Status: Full Code   DVT prophylaxis: heparin  injection 5,000 Units Start: 12/13/23 0600   Interim Hx: Patient was impulsive overnight getting up out of bed without assistance despite being counseled otherwise.  He was also noncompliant with nighttime BiPAP use.  Saturations stable at 94-95% on 6 L high flow nasal cannula.  Afebrile.  Vital signs otherwise stable.  Dramatically improved since the time of my last visit.  Asking when he can go home.  Still requiring supplemental oxygen.  Denies nausea or vomiting or shortness of breath or chest pain.  Assessment & Plan:  Acute exacerbation of chronic diastolic CHF TTE July 2024 noted EF 50-55% with grade 2 DD - on Farxiga  Toprol  and losartan  at home -taken off Aldactone  due to hyperkalemia - baseline dry weight appears to be approximately 105 kg - continue high dose lasix  as patient still above his dry weight - net negative approximately 8 L since admission  Filed Weights   12/13/23 1340 12/14/23 0500 12/15/23 0500  Weight: 116.1 kg 113.8 kg 108 kg     Possible  community-acquired pneumonia CXR not definitive -procalcitonin elevated at presentation -continue empiric antibiotic course to complete 5 days of tx   Acute hypercarbic and hypoxic respiratory failure Likely primarily due to acute CHF exacerbation but also possible contributions from pneumonia and obesity and COPD - did not require intubation in ICU (managed w/ BiPAP and HHFNC) - cont nightly BiPAP support and titrate O2 down during day as able   Acute COPD exac Developed some wheezing while in the ICU - cont triple therapy nebs and short course of steroids -has previously been referred for outpatient pulmonary rehab but did not keep appointments  AKI Baseline creatinine approximately 1.2-1.5 - creatinine 1.9 at presentation - creatinine has returned to his baseline range  Recent Labs  Lab 12/12/23 1600 12/13/23 0426 12/14/23 0444 12/15/23 0320  CREATININE 1.89* 1.48* 1.29* 1.25*    DM2 CBG well-controlled   Alcohol abuse with mild withdrawal symptoms Withdrawal appears to have resolved at this time -counseled on absolute need to discontinue alcohol use entirely  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   Family Communication: No family present at time of exam Disposition: Discharge home as soon as oxygen can be weaned down  Objective: Blood pressure (!) 145/75, pulse 73, temperature 97.7 F (36.5 C), temperature source Oral, resp. rate 15, height 6' 1 (1.854 m), weight 108 kg, SpO2 95%.  Intake/Output Summary (Last 24 hours) at 12/15/2023 0750 Last data filed at 12/15/2023 9373 Gross per 24 hour  Intake 590.16 ml   Output 6750 ml  Net -6159.84 ml   Filed Weights   12/13/23 1340 12/14/23 0500 12/15/23 0500  Weight: 116.1 kg 113.8 kg 108 kg    Examination: General: No acute respiratory distress at rest -alert and conversant Lungs: Clear to auscultation bilaterally -poor breath sounds throughout but no focal crackles and no wheezing Cardiovascular: Regular rate and rhythm without murmur gallop or rub normal S1 and S2 Abdomen: Nontender, nondistended, soft, bowel sounds positive, no rebound, no ascites, no appreciable mass Extremities: Trace bilateral lower extremity edema  CBC: Recent Labs  Lab 12/12/23 1600 12/13/23 0426 12/14/23 0444 12/15/23 0320  WBC 10.5 8.6 7.4 7.9  NEUTROABS 7.8*  --   --   --   HGB 13.3 11.9* 12.0* 12.2*  HCT 40.8 36.9* 38.2* 38.6*  MCV 107.9* 108.8* 111.0* 108.1*  PLT 208 167 131* 151   Basic Metabolic Panel: Recent Labs  Lab 12/13/23 0426 12/14/23 0444 12/15/23 0320  NA 137 138 139  K 4.5 4.2 3.4*  CL 97* 98 102  CO2 27 29 22   GLUCOSE 128* 102* 183*  BUN 48* 34* 32*  CREATININE 1.48* 1.29* 1.25*  CALCIUM  8.9 8.8* 9.3  MG 2.3 1.9 1.8   GFR: Estimated Creatinine Clearance: 76.9 mL/min (A) (by C-G formula based on SCr of 1.25 mg/dL (H)).   Scheduled Meds:  acetaZOLAMIDE   500 mg Oral BID   arformoterol   15 mcg Nebulization BID   budesonide  (PULMICORT ) nebulizer solution  0.5 mg Nebulization BID   Chlorhexidine  Gluconate Cloth  6 each Topical Daily   gabapentin   300 mg Oral BID   guaiFENesin   600 mg Oral BID   heparin   5,000 Units Subcutaneous Q8H   insulin  aspart  0-9 Units Subcutaneous Q4H   methylPREDNISolone  (SOLU-MEDROL ) injection  40 mg Intravenous Q12H   mupirocin  ointment  1 Application Nasal BID   mouth rinse  15 mL Mouth Rinse 4 times per day   revefenacin   175 mcg Nebulization Daily   sodium chloride  flush  3 mL Intravenous Q12H   thiamine   100 mg Intravenous Daily      LOS: 3 days   Reyes IVAR Moores, MD [facility] Office  [phone] Pager - Text Page per Tracey  If 7PM-7AM, please contact night-coverage per Amion 12/15/2023, 7:50 AM

## 2023-12-16 ENCOUNTER — Other Ambulatory Visit (HOSPITAL_COMMUNITY): Payer: Self-pay

## 2023-12-16 DIAGNOSIS — I5033 Acute on chronic diastolic (congestive) heart failure: Secondary | ICD-10-CM | POA: Diagnosis not present

## 2023-12-16 LAB — GLUCOSE, CAPILLARY
Glucose-Capillary: 219 mg/dL — ABNORMAL HIGH (ref 70–99)
Glucose-Capillary: 211 mg/dL — ABNORMAL HIGH (ref 70–99)

## 2023-12-16 LAB — BASIC METABOLIC PANEL WITH GFR
Anion gap: 13 (ref 5–15)
BUN: 49 mg/dL — ABNORMAL HIGH (ref 8–23)
CO2: 21 mmol/L — ABNORMAL LOW (ref 22–32)
Calcium: 9.6 mg/dL (ref 8.9–10.3)
Chloride: 105 mmol/L (ref 98–111)
Creatinine, Ser: 1.21 mg/dL (ref 0.61–1.24)
GFR, Estimated: >60 mL/min (ref >60)
Glucose, Bld: 204 mg/dL — ABNORMAL HIGH (ref 70–99)
Potassium: 3.5 mmol/L (ref 3.5–5.1)
Sodium: 139 mmol/L (ref 135–145)

## 2023-12-16 LAB — MAGNESIUM: Magnesium: 2 mg/dL (ref 1.7–2.4)

## 2023-12-16 MED ORDER — FLUTICASONE PROPIONATE HFA 110 MCG/ACT IN AERO
1.0000 | INHALATION_SPRAY | Freq: Two times a day (BID) | RESPIRATORY_TRACT | 2 refills | Status: DC
  Filled 2023-12-16: qty 12, 30d supply, fill #0

## 2023-12-16 MED ORDER — FUROSEMIDE 40 MG PO TABS
80.0000 mg | ORAL_TABLET | Freq: Two times a day (BID) | ORAL | Status: DC
  Administered 2023-12-16: 80 mg via ORAL
  Filled 2023-12-16: qty 2

## 2023-12-16 NOTE — Plan of Care (Signed)
  Problem: Nutritional: Goal: Maintenance of adequate nutrition will improve Outcome: Progressing   Problem: Education: Goal: Knowledge of General Education information will improve Description: Including pain rating scale, medication(s)/side effects and non-pharmacologic comfort measures Outcome: Progressing   Problem: Activity: Goal: Risk for activity intolerance will decrease Outcome: Progressing   Problem: Pain Managment: Goal: General experience of comfort will improve and/or be controlled Outcome: Progressing

## 2023-12-16 NOTE — Discharge Summary (Signed)
 DISCHARGE SUMMARY  Luis [name]  MR#: [id number]  DOB:[birth year]  Date of Admission: 12/12/2023 Date of Discharge: 12/16/2023  Attending Physician:Lovie Zarling ONEIDA Moores, MD  Patient's ERE:Hmjb, Luis BRAVO, NP  Disposition: D/C home   Follow-up Appts:  Follow-up Information     Elnor Luis BRAVO, NP Follow up in 1 week(s).   Specialty: Nurse Practitioner Contact information: [address] 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  Initial presentation: 63 year old with a history of HTN, DM2, prior DVT, COPD, chronic diastolic CHF, CHB status post pacemaker, alcohol abuse, and diverticulitis status post colectomy with complicated postoperative wound infection who presented to the ER 12/12/2023 with worsening shortness of breath and progressive lower extremity edema over several days. At presentation he was saturating in the low 90s on room air with mild tachypnea and increased work of breathing. CXR suggested interstitial edema with patchy airspace opacities in the bases.   Hospital Course: 7/1 admit - ongoing diuresis - empiric abx  7/2 transferred to ICU due to progressive hypercarbic and hypoxic resp failure - BiPAP utilized 7/3 BiPAP titrated down to [facility] 7/4 transferred back to [facility]  Acute exacerbation of chronic diastolic CHF TTE July 2024 noted EF 50-55% with grade 2 DD - on Farxiga  Toprol  and losartan  at home -taken off Aldactone  due to hyperkalemia - baseline dry weight  appears to be approximately 105 kg - net negative approximately 9.6 L since admission -transition to oral Lasix  at time of discharge -patient's oxygen saturations are remaining at 88% or greater at rest -when ambulating he very transiently dips into the 85-86% range but this rapidly improved into the 90s when he pauses -the patient does not wish to utilize home oxygen and with ongoing diuresis and increasing mobility I feel it is reasonable to discharge him without it -repeat ambulatory saturation evaluation should be carried out when he visits his PCP to assure he has continued to improve as expected        Filed Weights    12/13/23 1340 12/14/23 0500 12/15/23 0500  Weight: 116.1 kg 113.8 kg 108 kg        Community-acquired pneumonia CXR not definitive -procalcitonin elevated at presentation -has completed a 5-day course of empiric antibiotic therapy   Acute hypercarbic and hypoxic respiratory failure Likely primarily due to acute CHF exacerbation but also contributions from pneumonia and obesity and COPD - did not require intubation in ICU (managed w/ BiPAP and HHFNC) -see discussion above regarding oxygen support   Acute COPD exac Developed some wheezing while in the ICU - continued triple therapy nebs during hospitalization and short course of steroids -has previously been referred for outpatient pulmonary rehab but did not keep appointments   AKI Baseline creatinine approximately 1.2-1.5 - creatinine 1.9 at presentation - creatinine has returned to his baseline range   Last Labs         Recent Labs  Lab 12/12/23 1600 12/13/23 0426 12/14/23 0444 12/15/23 0320 12/16/23 0542  CREATININE 1.89* 1.48* 1.29* 1.25*  1.21        DM2 CBG well-controlled during this admission   Macrocytosis Likely a consequence of alcohol abuse -check B12 and folic acid  as an outpatient   Alcohol abuse with mild withdrawal symptoms Withdrawal has resolved resolved at this time -counseled on absolute  need to discontinue alcohol use entirely   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  Followed at [facility] - WOC RN provided inpatient wound care recs - tx w/ empiric abx as wound appeared worrisome for overlying cellulitis at presentation, likely related to poor care at home -wound has stabilized at time of discharge with no purulent discharge -to resume usual outpatient wound care regimen and follow-up with [facility] as previously arranged   Acute gout  Short course of steroid utilized with good results -possibly simply due to acute illness -consider adding allopurinol to regimen if recurs as an outpatient   Complete heart block status post leadless PPM   Tobacco abuse Counseled on absolute need to discontinue tobacco abuse   Obesity - Body mass index is 31.41 kg/m.  Allergies as of 12/16/2023       Reactions   Codeine Hives   Lisinopril Other (See Comments)   Other reaction(s): renal effects        Medication List     STOP taking these medications    amLODipine  10 MG tablet Commonly known as: NORVASC        TAKE these medications    Accu-Chek Guide Test test strip Generic drug: glucose blood Use to test blood sugar once daily   Accu-Chek Softclix Lancets lancets Use to test blood sugar once daily   acetaminophen  650 MG CR tablet Commonly known as: Acetaminophen  8 Hour Take 1 tablet (650 mg total) by mouth every 8 (eight) hours as needed for pain What changed:  how much to take reasons to take this   ascorbic acid  500 MG tablet Commonly known as: VITAMIN C  Take 1 tablet (500 mg total) by mouth daily.   atorvastatin  10 MG tablet Commonly known as: LIPITOR Take 1 tablet (10 mg total) by mouth daily.   cetirizine  10 MG tablet Commonly known as: ZYRTEC  Take 1 tablet (10 mg total) by mouth daily.   dapagliflozin  propanediol 10 MG Tabs tablet Commonly known as: Farxiga  Take 1 tablet (10 mg total) by mouth daily before breakfast.   fluticasone  110 MCG/ACT  inhaler Commonly known as: FLOVENT  HFA Inhale 1 puff into the lungs in the morning and at bedtime.   folic acid  1 MG tablet Commonly known as: FOLVITE  Take 1 tablet (1 mg total) by mouth daily.   gabapentin  300 MG capsule Commonly known as: NEURONTIN  Take 2 capsules (600 mg total) by mouth 2 (two) times daily.   guaiFENesin  600 MG 12 hr tablet Commonly known as: Mucinex  Take 1 tablet (600 mg total) by mouth 2 (two) times daily as needed to loosen phlegm or cough.   hydrOXYzine  50 MG capsule Commonly known as: VISTARIL  Take 1 capsule (50 mg total) by mouth at bedtime as needed.   ipratropium-albuterol  0.5-2.5 (3) MG/3ML Soln Commonly known as: DUONEB Take 3 mLs by nebulization every 6 (six) hours as needed (wheezing / sob).   losartan  25 MG tablet Commonly known as: COZAAR  Take 1 tablet (25 mg total) by mouth daily.   metFORMIN  500 MG tablet Commonly known as: GLUCOPHAGE  Take 2 tablets (1,000 mg total) by mouth daily with breakfast AND 1 tablet (500 mg total) daily with supper.   metoprolol  succinate  25 MG 24 hr tablet Commonly known as: TOPROL -XL Take 1 tablet (25 mg total) by mouth daily.   Stiolto Respimat  2.5-2.5 MCG/ACT Aers Generic drug: Tiotropium Bromide -Olodaterol Inhale 2 puffs into the lungs daily.   torsemide  20 MG tablet Commonly known as: DEMADEX  Take 4 tablets (80 mg total) by mouth daily.   triamcinolone  cream 0.5 % Commonly known as: KENALOG  Apply topically to legs twice daily for up to 2 weeks as needed for eczema        Day of Discharge BP 113/76 (BP Location: Right Arm)   Pulse 61   Temp 97.6 F (36.4 C) (Oral)   Resp 19   Ht 6' 1 (1.854 m)   Wt 108 kg   SpO2 94%   BMI 31.41 kg/m   Physical Exam: General: No acute respiratory distress Lungs: Clear to auscultation bilaterally without wheezes or crackles Cardiovascular: Regular rate and rhythm without murmur gallop or rub normal S1 and S2 Abdomen: Nontender, nondistended, soft,  bowel sounds positive, no rebound, no ascites, no appreciable mass Extremities: No significant cyanosis, clubbing, or edema bilateral lower extremities  Basic Metabolic Panel: Recent Labs  Lab 12/12/23 1600 12/13/23 0426 12/14/23 0444 12/15/23 0320 12/16/23 0542  NA 133* 137 138 139 139  K 4.9 4.5 4.2 3.4* 3.5  CL 93* 97* 98 102 105  CO2 24 27 29 22  21*  GLUCOSE 122* 128* 102* 183* 204*  BUN 55* 48* 34* 32* 49*  CREATININE 1.89* 1.48* 1.29* 1.25* 1.21  CALCIUM  9.8 8.9 8.8* 9.3 9.6  MG  --  2.3 1.9 1.8 2.0    CBC: Recent Labs  Lab 12/12/23 1600 12/13/23 0426 12/14/23 0444 12/15/23 0320  WBC 10.5 8.6 7.4 7.9  NEUTROABS 7.8*  --   --   --   HGB 13.3 11.9* 12.0* 12.2*  HCT 40.8 36.9* 38.2* 38.6*  MCV 107.9* 108.8* 111.0* 108.1*  PLT 208 167 131* 151     Time spent in discharge (includes decision making & examination of pt): 35 minutes  12/16/2023, 2:18 PM   Reyes IVAR Moores, MD [facility] Office  [phone]

## 2023-12-18 ENCOUNTER — Other Ambulatory Visit: Payer: Self-pay

## 2023-12-18 ENCOUNTER — Other Ambulatory Visit (HOSPITAL_COMMUNITY): Payer: Self-pay

## 2023-12-19 ENCOUNTER — Telehealth: Payer: Self-pay | Admitting: *Deleted

## 2023-12-19 ENCOUNTER — Encounter (HOSPITAL_COMMUNITY)

## 2023-12-19 NOTE — Transitions of Care (Post Inpatient/ED Visit) (Signed)
   12/19/2023  Name: Luis [name] MRN: [id number] DOB: 1960/07/21  Today's TOC FU Call Status: Today's TOC FU Call Status:: Unsuccessful Call (1st Attempt) Unsuccessful Call (1st Attempt) Date: 12/19/23 (hospital discharge notification received on 12/18/23)  Attempted to reach the patient regarding the most recent Inpatient visit.  Left HIPAA compliant voice message requesting call back  Follow Up Plan: Additional outreach attempts will be made to reach the patient to complete the Transitions of Care (Post Inpatient/ED visit) call.   Pls call/ message for questions,  [name] [name], RN, BSN, CCRN Alumnus RN Care Manager  Transitions of Care  [facility] - [facility] [phone]: direct office

## 2023-12-20 ENCOUNTER — Telehealth: Payer: Self-pay | Admitting: *Deleted

## 2023-12-20 ENCOUNTER — Encounter: Payer: Self-pay | Admitting: *Deleted
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
## 2023-12-21 ENCOUNTER — Other Ambulatory Visit (HOSPITAL_COMMUNITY): Payer: Self-pay

## 2023-12-21 ENCOUNTER — Other Ambulatory Visit: Payer: Self-pay

## 2023-12-21 ENCOUNTER — Encounter: Payer: Self-pay | Admitting: Emergency Medicine

## 2023-12-21 ENCOUNTER — Encounter (HOSPITAL_COMMUNITY)

## 2023-12-21 ENCOUNTER — Other Ambulatory Visit: Payer: Self-pay | Admitting: Nurse Practitioner

## 2023-12-21 ENCOUNTER — Telehealth: Payer: Self-pay | Admitting: *Deleted

## 2023-12-21 DIAGNOSIS — E1169 Type 2 diabetes mellitus with other specified complication: Secondary | ICD-10-CM

## 2023-12-21 MED ORDER — METFORMIN HCL 500 MG PO TABS
ORAL_TABLET | ORAL | 3 refills | Status: DC
  Filled 2023-12-21: qty 270, 90d supply, fill #0

## 2023-12-21 NOTE — Transitions of Care (Post Inpatient/ED Visit) (Signed)
 12/21/2023  Name: Luis [name] MRN: [id number] DOB: May 19, 1961  Today's TOC FU Call Status: Today's TOC FU Call Status:: Successful TOC FU Call Completed TOC FU Call Complete Date: 12/21/23 Patient's Name and Date of Birth confirmed.  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  I don't have any of the papers they sent home from the hospital with me right now.  I don't trust anything you are telling me about these medicines, I need an office visit) Any questions or concerns?: Yes Patient Questions/Concerns:: Patient verbalizes not having his discharge papers because his house is turned upside down; I attempted multiple times to review discharge instructions, including appointments and medications- however, patient is unable to focus during phone call today and repeatedly tells me he does not trust what I am telling him; overall pleasant demeanor but is cursing/ argumentative throughout [facility] call; attempted multiple times to re-focus patient, but was unsuccessful Patient Questions/Concerns Addressed: Other: (confirmed that [facility]I LCSW/ RN CM are established: confirmed through scheduling care guide that re-scheduling of RN CM is in active progress; made [facility]BCI team and PCP aware of details of patient call today; possible need for in-person visit with RNCM/ LCSW)  Items Reviewed: Did you receive and understand the discharge instructions provided?: Yes (Attempted review with patient multiple times: he is unable to focus and verbalizes mistrust of information I am trying to share - he has misplaced  printed discharge instructions, states he will check MYChart to determine details of discharge instructions) Medications obtained,verified, and reconciled?: Partial Review Completed (Attempted to provide information to patient around hospital discharge medication instructions/ changes: he tells me he does not belive me, and that he does not trust the information I am providing: PCP/ VBCI team made aware) Reason for Partial Mediation Review: Patient declined full review, stated my damn house is turned completely upside down; I don't know where anything is, they are here replacing my floor Any new allergies since your discharge?: No Dietary orders reviewed?: Yes Type of Diet Ordered:: As healthy as I can Do you have support at home?: Yes People in Home [RPT]: alone Name of Support/Comfort Primary Source: Reports independent in self-care activities; resides alone; report supportive neighbors assists as/ if needed/ indicated - has son but he lives in [place]  Medications Reviewed Today: Medications Reviewed Today     Reviewed by Teniola Tseng M, RN (Registered Nurse) on 12/21/23 at 1053  Med List Status: <None>   Medication Order Taking? Sig Documenting Provider Last Dose Status Informant  Accu-Chek Softclix Lancets lancets [id number]  Use to test blood sugar once daily Elnor [name], NP  Active Self, Pharmacy Records, Multiple Informants  acetaminophen  (ACETAMINOPHEN  8 HOUR) 650 MG CR tablet [id number]  Take 1 tablet (650 mg total) by mouth every 8 (eight) hours as needed for pain  Patient taking differently: Take 1,300 mg by mouth every 8 (eight) hours as needed for pain or fever.     Active Self, Pharmacy Records, Multiple Informants           Med Note KARLYNN, HEDDY HERO   Tue Sep 12, 2023  1:49 PM) Reports takes 1200 mg at [place],  1200mg  at 12N, 1200mg  at 6pm and 1200mg  again.  ascorbic acid  (VITAMIN C ) 500 MG tablet [id number]  Take 1 tablet (500 mg total) by mouth daily.   Active Self, Pharmacy  Records, Multiple Informants  atorvastatin  (LIPITOR) 10 MG tablet [id number]  Take 1 tablet (10 mg total) by mouth daily. Elnor [name], NP  Active Self, Pharmacy Records, Multiple Informants  cetirizine  (ZYRTEC ) 10 MG tablet [id number]  Take 1 tablet (10 mg total) by mouth daily. Elnor [name], NP  Active Self, Pharmacy Records, Multiple Informants  dapagliflozin  propanediol (FARXIGA ) 10 MG TABS tablet [id number]  Take 1 tablet (10 mg total) by mouth daily before breakfast. Elnor [name], NP  Active Self, Pharmacy Records, Multiple Informants  fluticasone  (FLOVENT  HFA) 110 MCG/ACT inhaler [id number] Yes Inhale 1 puff into the lungs in the morning and at bedtime. Danton Reyes DASEN, MD  Active   folic acid  (FOLVITE ) 1 MG tablet [id number]  Take 1 tablet (1 mg total) by mouth daily. Webb, Padonda B, FNP  Active Self, Pharmacy Records, Multiple Informants  gabapentin  (NEURONTIN ) 300 MG capsule [id number]  Take 2 capsules (600 mg total) by mouth 2 (two) times daily. Joya Stabs, DPM  Active Self, Pharmacy Records, Multiple Informants  glucose blood (ACCU-CHEK GUIDE TEST) test strip [id number]  Use to test blood sugar once daily Elnor [name], NP  Active Self, Pharmacy Records, Multiple Informants  guaiFENesin  (MUCINEX ) 600 MG 12 hr tablet [id number]  Take 1 tablet (600 mg total) by mouth 2 (two) times daily as needed to loosen phlegm or cough. Elnor [name], NP  Active Self, Pharmacy Records, Multiple Informants           Med Note (LEE, NICOLE   Wed Dec 13, 2023  2:51 AM)    hydrOXYzine  (VISTARIL ) 50 MG capsule [id number]  Take 1 capsule (50 mg total) by mouth at bedtime as needed. Elnor [name], NP  Active Self, Pharmacy Records, Multiple Informants    Discontinued 03/15/23 1105 (Change in therapy)   ipratropium-albuterol  (DUONEB) 0.5-2.5 (3) MG/3ML SOLN [id number]  Take 3 mLs by nebulization every 6 (six) hours as needed (wheezing / sob).  Patient not taking: Reported on 12/13/2023   [provider]   Active Self, Pharmacy Records, Multiple Informants  losartan  (COZAAR ) 25 MG tablet [id number]  Take 1 tablet (25 mg total) by mouth daily. Gardenia Led, DO  Active Self, Pharmacy Records, Multiple Informants  metFORMIN  (GLUCOPHAGE ) 500 MG tablet [id number]  Take 2 tablets (1,000 mg total) by mouth daily with breakfast AND 1 tablet (500 mg total) daily with supper. Elnor [name], NP  Active Self, Pharmacy Records, Multiple Informants  metoprolol  succinate (TOPROL -XL) 25 MG 24 hr tablet [id number]  Take 1 tablet (25 mg total) by mouth daily. Sabharwal, Aditya, DO  Active Self, Pharmacy Records, Multiple Informants  Tiotropium Bromide -Olodaterol 2.5-2.5 MCG/ACT AERS [id number]  Inhale 2 puffs into the lungs daily. Hope Almarie ORN, NP  Active Self, Pharmacy Records, Multiple Informants  torsemide  (DEMADEX ) 20 MG tablet [id number] Yes Take 4 tablets (80 mg total) by mouth daily.  Patient taking differently: Take 80 mg by mouth daily. 12/21/23: Reports during TOC call there is no way I am supposed to take 80 mg of that every day, I just don't believe that; attempted to review dosing change per PCP note/ patient phone call on 12/07/23: patient does not believe he does not provide specific dose he is currently taking: states I used to take 20 mg every day  Douglass Kenney NOVAK, FNP  Active Self, Pharmacy Records, Multiple Informants  triamcinolone  cream (KENALOG ) 0.5 % [id number]  Apply topically to legs twice daily for up to 2 weeks as needed for eczema Elnor [name], NP  Active Self, Pharmacy Records, Multiple Informants           Home Care and Equipment/Supplies: Were Home Health Services Ordered?: No Any new equipment or medical supplies ordered?: No  Functional Questionnaire: Do you need assistance with bathing/showering or dressing?: No Do you need assistance with meal preparation?: No Do you need assistance with eating?: No Do you have difficulty maintaining continence: No (reports  self-manages colostomy  independently) Do you need assistance with getting out of bed/getting out of a chair/moving?: No Do you have difficulty managing or taking your medications?: No (Patient self-manages medications- unable to determine his understanding of his medications: he declined full medication review during TOC call)  Follow up appointments reviewed: PCP Follow-up appointment confirmed?: Yes (care coordination outreach in real-time with scheduling care guide to successfully schedule hospital follow up PCP appointment 12/28/23) Date of PCP follow-up appointment?: 12/28/23 Follow-up Provider: PCP- covering provider: Corean Ku NP Specialist Hospital Follow-up appointment confirmed?: Yes Date of Specialist follow-up appointment?: 01/03/24 Follow-Up Specialty Provider:: Podiatry Do you need transportation to your follow-up appointment?: No Do you understand care options if your condition(s) worsen?: Yes-patient verbalized understanding  SDOH Interventions Today    Flowsheet Row Most Recent Value  SDOH Interventions   Food Insecurity Interventions Intervention Not Indicated  [Reports established with MOW]  Housing Interventions Intervention Not Indicated  Transportation Interventions Intervention Not Indicated  [Reports during TOC cfall, well established with Medicaidf transportation,  reports neighbors currently assisting with transportation]  Utilities Interventions Patient Declined   Was unable to engage patient in [facility] 30-day program: he is unable to focus and verbalizes mistrust of information provided during TOC call; confirmed VBCI CCM team established: care coordination outreaches completed accordingly  See TOC assessment tabs for additional assessment/ TOC intervention information  Pls call/ message for questions,  Aarionna Germer Mckinney Tessie Ordaz, RN, BSN, CCRN Alumnus RN Care Manager  Transitions of Care  [facility] - [facility] [phone]: direct  office

## 2023-12-22 ENCOUNTER — Telehealth: Payer: Self-pay

## 2023-12-22 ENCOUNTER — Telehealth: Payer: Self-pay | Admitting: *Deleted

## 2023-12-22 NOTE — Telephone Encounter (Signed)
 Copied from CRM [id number]. Topic: Clinical - Medication Question >> Dec 22, 2023 12:44 PM Franky GRADE wrote: Reason for CRM: Patient was discharged from the hospital and was informed to stop taking amLODipine  (NORVASC ) 10 MG tablet [[id number]] but he is confused as to why he should stop taking that medication.

## 2023-12-22 NOTE — Telephone Encounter (Unsigned)
 Copied from CRM [id number]. Topic: Clinical - Medication Question >> Dec 22, 2023  3:12 PM Chiquita SQUIBB wrote: Reason for CRM: Patient has questions regarding his medications and his amLODipine  (NORVASC ) 10 MG tablet [[id number]] , patient is very worried about not taking his blood pressure medications all weekend since they told him not too take it anymore. Please contact the patient.

## 2023-12-22 NOTE — Telephone Encounter (Signed)
 Pt discharged from the hospital 12/16/2023 for pneumonia. No patient response from previous outreach to reschedule first Lung CT with the [facility]. Will defer for a few weeks and then will make one more attempt to contact. If no response, referral will be closed.

## 2023-12-22 NOTE — Progress Notes (Signed)
 Complex Care Management Care Guide Note  12/22/2023 Name: Luis [name] MRN: [id number] DOB: [birth year]  Luis [name] is a 63 y.o. year old male who is a primary care patient of Elnor [name], NP and is actively engaged with the care management team. I reached out to [name] by phone today to assist with scheduling  with the RN Case Manager.  Follow up plan: Unsuccessful telephone outreach attempt made. A HIPAA compliant phone message was left for the patient providing contact information and requesting a return call.  Thedford Franks, CMA [place]  [facility], [facility] Guide Direct Dial: [phone]  Fax: [phone] Website: [email or web address]

## 2023-12-22 NOTE — Progress Notes (Signed)
 Complex Care Management Care Guide Note  12/22/2023 Name: Damondre Pfeifle MRN: [id number] DOB: [birth year]  Earon Rivest is a 63 y.o. year old male who is a primary care patient of Elnor [name], NP and is actively engaged with the care management team. I reached out to [name] by phone today to assist with scheduling  with the RN Case Manager.  Follow up plan: Telephone appointment with complex care management team member scheduled for:  12/25/2023  Thedford Franks, CMA [place]  [facility], [facility] Guide Direct Dial: [phone]  Fax: [phone] Website: [email or web address]

## 2023-12-23 DIAGNOSIS — Z419 Encounter for procedure for purposes other than remedying health state, unspecified: Secondary | ICD-10-CM | POA: Diagnosis not present

## 2023-12-25 ENCOUNTER — Other Ambulatory Visit (HOSPITAL_BASED_OUTPATIENT_CLINIC_OR_DEPARTMENT_OTHER): Payer: Self-pay

## 2023-12-25 ENCOUNTER — Other Ambulatory Visit: Payer: Self-pay

## 2023-12-25 ENCOUNTER — Other Ambulatory Visit (HOSPITAL_COMMUNITY): Payer: Self-pay

## 2023-12-25 VITALS — Wt 224.0 lb

## 2023-12-25 DIAGNOSIS — J441 Chronic obstructive pulmonary disease with (acute) exacerbation: Secondary | ICD-10-CM

## 2023-12-25 DIAGNOSIS — I5033 Acute on chronic diastolic (congestive) heart failure: Secondary | ICD-10-CM

## 2023-12-25 NOTE — Patient Instructions (Signed)
 Visit Information  Luis [name] was given information about Medicaid Managed Care team care coordination services as a part of their [facility] Medicaid benefit. Luis [name] verbally consented to engagement with the [facility] Managed Care team.   If you are experiencing a medical emergency, please call 911 or report to your local emergency department or urgent care.   If you have a non-emergency medical problem during routine business hours, please contact your provider's office and ask to speak with a nurse.   For questions related to your [facility] health plan, please call: [phone] or go here:[email or web address]  If you would like to schedule transportation through your [facility] plan, please call the following number at least 2 days in advance of your appointment: [phone].   You can also use the MTM portal or MTM mobile app to manage your rides. Reimbursement for transportation is available through [facility]! For the portal, please go to [email or web address]  Call the [facility] Crisis Line at [phone], at any time, 24 hours a day, 7 days a week. If you are in danger or need immediate medical attention call 911.  If you would like help to quit smoking, call 1-800-QUIT-NOW (6628014042) OR Espaol: 1-855-Djelo-Ya (8-144-664-6430) o para ms informacin haga clic aqu or Text READY to 799-599 to register via text  Luis [name] - following are the goals we discussed in your visit today:   Goals Addressed             This Visit's Progress    VBCI RN Care Plan-COPD       Problems:  Chronic Disease Management support and education needs related to COPD Lacks caregiver support. Non-adherence to prescribed medication regimen  Goal: Over the next 90 days the Patient will attend all scheduled medical appointments: including primary care provider 10/05/23; advance heart failure clinic 10/06/23;  as evidenced by patient report and review of chart         continue to work with RN Care Manager and/or Social Worker to address care management and care coordination needs related to COPD as evidenced by adherence to care management team scheduled appointments     demonstrate Improved adherence to prescribed treatment plan for COPD as evidenced by patient report and review of chart  Interventions:   COPD Interventions: Discussed the importance of adequate rest and management of fatigue with COPD Provided contact number and encouraged patient to call pulmonology for appointment.  Extensively reviewed medications with patient and went over which inhaler contains the steroid to decrease lung inflammation and which inhaler relaxes the muscles around the airway to make it easier to breathe. Also reiterated nebulizer medication every 6 hours as needed for wheezing or shortness of breath. Patient noted that Stiolto Respimat  inhaler was empty. Patient acknowledged the importance of getting the medication refilled right away and states is calling his when he got off the phone. Referral to clinical pharmacy to assist with medication/health management.  Patient Self-Care Activities:  Attend all scheduled provider appointments Call provider office for new concerns or questions  Take medications as prescribed   Work with the social worker to address care coordination needs and will continue to work with the clinical team to address health care and disease management related needs Attend pulmonary rehab sessions as scheduled.   Plan:  Telephone follow up appointment with care management team member scheduled for:  11/29/23     VBCI RN Care Plan-HF       Problems:  Chronic Disease Management support and education needs  related to CHF  Goal: Over the next 90 days the Patient will attend all scheduled medical appointments: including general surgery, primary care provider, Podiatrist. And schedule a follow up appointment with Electrophsiologist and pulmonology   as evidenced by patient report and review of chart        continue to work with RN Care Manager and/or Social Worker to address care management and care coordination needs related to CHF as evidenced by adherence to care management team scheduled appointments     demonstrate Improved adherence to prescribed treatment plan for CHF as evidenced by  patient report or review of chart  Interventions:   Heart Failure Interventions: Discussed the importance of keeping all appointments with provider Advised patient to continue to monitor weights and notify provider if worsening of condition advised patient to continue to weight and record weight. Notify provider, cardiology and/or Primary care provider if weights increased 2-3 pounds overnight or 5 pounds in a week. Reviewed upcoming appointments Medications reviewed with patient Referral to clinical pharmacist regarding education and assistance with medication management.  Patient Self-Care Activities:  Attend all scheduled provider appointments Call provider office for new concerns or questions  Take medications as prescribed   Work with the social worker to address care coordination needs and will continue to work with the clinical team to address health care and disease management related needs call office if I gain more than 2 pounds in one day or 5 pounds in one week and follow instructions per HF clinic for as needed torsemide . Do the following things EVERYDAY: Weigh yourself in the morning before breakfast. Write it down and keep it in a log. Take your medicines as prescribed Eat low salt foods--Limit salt (sodium) to 2000 mg per day.  Stay as active as you can everyday Limit all fluids for the day to less than 2 liters  Plan:  Telephone follow up appointment with care management team member scheduled for:  01/01/24 at 11:00 am       Please see education materials related to COPD provided by MyChart link.  Patient verbalizes  understanding of instructions and care plan provided today and agrees to view in MyChart. Active MyChart status and patient understanding of how to access instructions and care plan via MyChart confirmed with patient.     RN Care Manager will follow up call on 01/01/24 at 11:00 am  Heddy Shutter, RN, MSN, BSN, CCM [place]  [facility], Population Health Case Manager Phone: [phone]   Tiotropium; Olodaterol Metered Dose Inhaler (MDI) What is this medication? TIOTROPIUM; OLODATEROL (tee oh TRO pee um; OH loe DA ter ol) treats chronic obstructive pulmonary disease (COPD). It works by opening the airways of the lungs, making it easier to breathe. It is a combination of an anticholinergic and a bronchodilator. It is often called a controller inhaler. Do not use it to treat a sudden COPD flare-up. This medicine may be used for other purposes; ask your health care provider or pharmacist if you have questions. COMMON BRAND NAME(S): STIOLTO RESPIMAT  What should I tell my care team before I take this medication? They need to know if you have any of these conditions: Asthma Diabetes Glaucoma Heart disease High blood pressure Irregular heartbeat or rhythm Kidney disease Pheochromocytoma Prostate disease Seizures Thyroid  disease An unusual or allergic reaction to tiotropium, olodaterol, ipratropium, atropine, other medications, foods, dyes, or preservatives Pregnant or trying to get pregnant Breast-feeding How should I use this medication? This medication is inhaled through  the mouth. Take it as directed on the prescription label at the same time every day. Do not use it more often than directed. Keep taking it unless your care team tells you to stop. A patient package insert for the product will be given with each prescription and refill. Be sure to read this information carefully each time. The sheet may change often. This medication comes with INSTRUCTIONS FOR USE. Ask  your pharmacist for directions on how to use this medication. Read the information carefully. Talk to your pharmacist or care team if you have questions. Talk to your care team about the use of this medication in children. It is not approved for use in children. Overdosage: If you think you have taken too much of this medicine contact a poison control center or emergency room at once. NOTE: This medicine is only for you. Do not share this medicine with others. What if I miss a dose? If you miss a dose, take it as soon as you can. If it is almost time for your next dose, take only that dose. Do not take double or extra doses. What may interact with this medication? Do not take this medication with any of the following: MAOIs like Carbex, Eldepryl, Marplan, Nardil, and Parnate This medication may also interact with the following: Atropine Certain antibiotics like clarithromycin, erythromycin, levofloxacin, linezolid, and telithromycin Certain medications for allergy, cough and cold Certain medications for bladder problems like oxybutynin, tolterodine Certain medications for depression, anxiety, or mental health conditions Certain medications for fungal infections like ketoconazole Certain medications for Parkinson's disease like benztropine, trihexyphenidyl Certain medications for stomach problems like dicyclomine, hyoscyamine Certain medications for the heart Certain medications for travel sickness like scopolamine Certain medications for weight loss including some herbal products Diuretics Ipratropium Other medications that can cause heart rhythm changes Procarbazine Ritonavir Steroid medications like prednisone  or cortisone Stimulant medications for attention disorders, weight loss, or to stay awake Theophylline Thyroid  hormones This list may not describe all possible interactions. Give your health care provider a list of all the medicines, herbs, non-prescription drugs, or dietary  supplements you use. Also tell them if you smoke, drink alcohol, or use illegal drugs. Some items may interact with your medicine. What should I watch for while using this medication? Visit your care team for regular checks on your progress. Tell your care team if your symptoms do not start to get better or if they get worse. NEVER use this medication for an acute asthma attack. You should use your short-acting rescue inhaler for an acute attack. If your symptoms get worse or if you need your short-acting inhalers more often, call your care team right away. This medication can worsen breathing or cause wheezing right after you use it. Be sure you have a short-acting inhaler for acute attacks (wheezing) nearby. If this happens, stop using this medication right away and call your care team. This medication may increase the risk of serious asthma-related problems. Talk your care team if you have questions. Do not treat yourself for coughs, colds or allergies without asking your care team for advice. Some nonprescription medications can affect this one. You may get dizzy. Do not drive, use machinery, or do anything that needs mental alertness until you know how this medication affects you. Do not stand up or sit up quickly, especially if you are an older patient. This reduces the risk of dizzy or fainting spells. What side effects may I notice from receiving this medication? Side effects that  you should report to your care team as soon as possible: Allergic reactions--skin rash, itching, hives, swelling of the face, lips, tongue, or throat Heart rhythm changes--fast or irregular heartbeat, dizziness, feeling faint or lightheaded, chest pain, trouble breathing Muscle pain or cramps Sudden eye pain or change in vision such as blurry vision, seeing halos around lights, vision loss Trouble passing urine Wheezing or trouble breathing that is worse after use Side effects that usually do not require medical  attention (report to your care team if they continue or are bothersome): Constipation Cough Dry mouth Headache Sore throat Tremors or shaking Trouble sleeping This list may not describe all possible side effects. Call your doctor for medical advice about side effects. You may report side effects to FDA at 1-800-FDA-1088. Where should I keep my medication? Keep out of the reach of children and pets. Store at room temperature between 20 and 25 degrees C (68 and 77 degrees F). Keep inhaler away from extreme heat, cold or humidity. Throw away 3 months after removing it from the foil pouch, when the dose counter reads 0 or after the expiration date, whichever is first. NOTE: This sheet is a summary. It may not cover all possible information. If you have questions about this medicine, talk to your doctor, pharmacist, or health care provider.  2024 Elsevier/Gold Standard (2021-04-04 00:00:00)  Fluticasone  Metered Dose Inhaler (MDI) What is this medication? FLUTICASONE  (floo TIK a sone) prevents the symptoms of asthma. It works by decreasing inflammation in the airways, making it easier to breathe. It belongs to a group of medications called inhaled steroids. It is often called a controller inhaler. Do not use it to treat a sudden asthma attack. This medicine may be used for other purposes; ask your health care provider or pharmacist if you have questions. COMMON BRAND NAME(S): Flovent , Flovent  HFA What should I tell my care team before I take this medication? They need to know if you have any of these conditions: Eye disease Immune system problems Infection, especially a viral infection, such as chickenpox, cold sores, herpes Injury of mouth or throat Osteoporosis, weak bones Receiving steroids, such as dexamethasone  or prednisone  Recent surgery Vision problems An unusual or allergic reaction to fluticasone , other medications, foods, dyes, or preservatives Pregnant or trying to get  pregnant Breastfeeding How should I use this medication? This medication is inhaled through the mouth. Rinse your mouth with water after use. Make sure not to swallow the water. Take it as directed on the prescription label. Do not use it more often than directed. Keep using it unless your care team tells you to stop. This medication comes with INSTRUCTIONS FOR USE. Ask your pharmacist for directions on how to use this medication. Read the information carefully. Talk to your pharmacist or care team if you have questions. Talk to your care team about the use of this medication in children. While it may be prescribed for children as young as 4 years for selected conditions, precautions do apply. Overdosage: If you think you have taken too much of this medicine contact a poison control center or emergency room at once. NOTE: This medicine is only for you. Do not share this medicine with others. What if I miss a dose? If you miss a dose, take it as soon as you can. If it is almost time for your next dose, take only that dose. Do not take double or extra doses. What may interact with this medication? Certain antibiotics, such as clarithromycin, telithromycin Certain  antivirals for HIV or hepatitis Certain medications for fungal infections, such as ketoconazole, itraconazole, posaconazole, voriconazole Nefazodone This list may not describe all possible interactions. Give your health care provider a list of all the medicines, herbs, non-prescription drugs, or dietary supplements you use. Also tell them if you smoke, drink alcohol, or use illegal drugs. Some items may interact with your medicine. What should I watch for while using this medication? Visit your care team for regular checks on your progress. Tell them if your symptoms do not start to get better or if they get worse. Talk to your care team about how to treat an acute asthma attack or bronchospasm (wheezing). Be sure to always have a  short-acting inhaler with you. If you use your short-acting inhaler and your symptoms do not get better or if they get worse, call your care team right away. You and your care team should develop an Asthma Action Plan that is just for you. Be sure to know what to do if you are in the yellow (asthma is getting worse) or red (medical alert) zones. This medication may increase your risk of getting an infection. Call your care team for advice if you get a fever, chills, or sore throat, or other symptoms of a cold or flu. Do not treat yourself. Try to avoid being around people who are sick. Using this medication for a long time may weaken your bones. The risk of bone fractures may be increased. Talk to your care team about your bone health. This medication may slow your child's growth if it is taken for a long time at high doses. Your child's care team will monitor your child's growth. This medication may cause cataracts or glaucoma, especially with long term use. You should have regular eye exams while taking this medication. Tell your care team if you notice changes in your eyesight. What side effects may I notice from receiving this medication? Side effects that you should report to your care team as soon as possible: Allergic reactions--skin rash, itching, hives, swelling of the face, lips, tongue, or throat Flu-like symptoms--fever, chills, muscle pain, cough, headache, fatigue Low adrenal gland function--nausea, vomiting, loss of appetite, unusual weakness, fatigue, dizziness Pain, tingling, or numbness in the hands or feet Sinus pain or pressure around the face or forehead Thrush--white patches in the mouth Wheezing or trouble breathing that is worse after use Side effects that usually do not require medical attention (report to your care team if they continue or are bothersome): Change in taste Cough Diarrhea Headache Hoarseness Sore throat This list may not describe all possible side  effects. Call your doctor for medical advice about side effects. You may report side effects to FDA at 1-800-FDA-1088. Where should I keep my medication? Keep out of the reach of children and pets. Store at room temperature between 20 and 25 degrees C (68 and 77 degrees F) with the mouthpiece down. Keep inhaler away from extreme heat. Get rid of this medication when the dose counter reads 0 or after the expiration date, whichever is first. To get rid of medications that are no longer needed or have expired: Take the medication to a medication take-back program. Check with your pharmacy or law enforcement to find a location. If you cannot return the medication, ask your care team how to get rid of this medication safely. NOTE: This sheet is a summary. It may not cover all possible information. If you have questions about this medicine, talk to your doctor,  pharmacist, or health care provider.  2024 Elsevier/Gold Standard (2021-12-23 00:00:00)

## 2023-12-25 NOTE — Patient Outreach (Signed)
 Complex Care Management   Visit Note  12/25/2023  Name:  Luis [name] MRN: [id number] DOB: 1961/05/05  Situation: Referral received for Complex Care Management related to Heart Failure and COPD I obtained verbal consent from Patient.  Visit completed with patient  on the phone  Background:   Past Medical History:  Diagnosis Date   Acute respiratory failure with hypoxia (HCC) 12/23/2022   Alcohol withdrawal syndrome, with delirium (HCC) 12/27/2022   COPD (chronic obstructive pulmonary disease) (HCC)    Diabetes mellitus without complication (HCC)    Diverticulitis    DVT (deep venous thrombosis) (HCC)    x3   Encephalopathy acute 12/28/2022   ETOH abuse    H/O colectomy    Hyperlipidemia    Hypertension    Sepsis associated hypotension (HCC) 08/03/2019   Smoker     Assessment: [name] reports he feels so much better and reports he is able to move and walk without difficulty. He denies any signs/symptoms of HF exacerbation. Medications reviewed: Patient states has only been using one inhaler. Reports has been using Flovent  2 puffs twice a day. And has not been using Stiolto. Patient attempted to use while on the phone with [facility] and noticed it is empty. Patient acknowledged importance of getting this inhaler refilled to restart use.  Patient Reported Symptoms:  Cognitive Cognitive Status: Alert and oriented to person, place, and time, Normal speech and language skills      Neurological Neurological Review of Symptoms: No symptoms reported    HEENT HEENT Symptoms Reported: No symptoms reported      Cardiovascular Cardiovascular Symptoms Reported: No symptoms reported Other Cardiovascular Symptoms: denies any swelling, patient states he is weighing himself, but not everyday. He reports his last weight was 224 lb about 2 days ago. He states, I can walk now, I got my balance back. he denies and issues at this time. Does patient have uncontrolled Hypertension?: No Cardiovascular  Management Strategies: Medication therapy, Routine screening, Weight management Do You Have a Working Readable Scale?: Yes Weight: 224 lb (101.6 kg) (patient report home reading a couple days ago) Cardiovascular Comment: Patient encouraged to call to reschedule visit with electrophysiologist and encouraged to contact cardiologist and/or prmary care for signs/symptoms of HF exacerbation  Respiratory Respiratory Symptoms Reported: Chest tightness Other Respiratory Symptoms: patient denies wheezing, denies SOB and reports he is having some chest tighness.however during medication review noted he has not been taking inhalers as prescribed. has been taking Flonase  2 puffs twice a day and has not been taking Stiolto Respimat . Patient noted during assessment that his inhaler was empty. patient to call his pharmacy to request a refill. Respiratory Management Strategies: Medication therapy  Endocrine Endocrine Symptoms Reported: No symptoms reported Is patient diabetic?: Yes Is patient checking blood sugars at home?:  (does not check routinely)    Gastrointestinal Other Gastrointestinal Symptoms: ventual hernia with RLQ ileostomy scheduled to see general surgery/ostomy nurse 12/26/23      Genitourinary Genitourinary Symptoms Reported: Not assessed    Integumentary Integumentary Symptoms Reported: Wound Additional Integumentary Details: scheduled to see ostomy nurse/general surgery 12/26/23    Musculoskeletal Musculoskelatal Symptoms Reviewed: No symptoms reported Additional Musculoskeletal Details: Patient reports, I can walk now. I got my balance back        Psychosocial Psychosocial Symptoms Reported: No symptoms reported Other Psychosocial Conditions: patient reports he is feeling better since discharge from hospital. Reports he has a lot to keep up with . getting his floors replaced this week. He also has  follow up appointment scheduled. LSCW schedule to follow up on 01/04/24             10/25/2023    1:46 PM  Depression screen PHQ 2/9  Decreased Interest 1  Down, Depressed, Hopeless 1  PHQ - 2 Score 2  Altered sleeping 1  Tired, decreased energy 1  Change in appetite 0  Feeling bad or failure about yourself  1  Trouble concentrating 1  Moving slowly or fidgety/restless 0  Suicidal thoughts 0  PHQ-9 Score 6  Difficult doing work/chores Somewhat difficult    There were no vitals filed for this visit.  Medications Reviewed Today     Reviewed by Luis Angell M, RN (Registered Nurse) on 12/25/23 at 1510  Med List Status: <None>   Medication Order Taking? Sig Documenting Provider Last Dose Status Informant  Accu-Chek Softclix Lancets lancets [id number]  Use to test blood sugar once daily Luis [name], NP  Active Self, Pharmacy Records, Multiple Informants  acetaminophen  (ACETAMINOPHEN  8 HOUR) 650 MG CR tablet [id number] Yes Take 1 tablet (650 mg total) by mouth every 8 (eight) hours as needed for pain   Active Self, Pharmacy Records, Multiple Informants           Med Note Luis [name]   Tue Sep 12, 2023  1:49 PM) Reports takes 1200 mg at 6am, 1200mg  at 12N, 1200mg  at 6pm and 1200mg  again.  ascorbic acid  (VITAMIN C ) 500 MG tablet [id number] Yes Take 1 tablet (500 mg total) by mouth daily.   Active Self, Pharmacy Records, Multiple Informants  atorvastatin  (LIPITOR) 10 MG tablet [id number] Yes Take 1 tablet (10 mg total) by mouth daily. Luis [name], NP  Active Self, Pharmacy Records, Multiple Informants  cetirizine  (ZYRTEC ) 10 MG tablet [id number] Yes Take 1 tablet (10 mg total) by mouth daily. Luis [name], NP  Active Self, Pharmacy Records, Multiple Informants  dapagliflozin  propanediol (FARXIGA ) 10 MG TABS tablet [id number] Yes Take 1 tablet (10 mg total) by mouth daily before breakfast. Luis [name], NP  Active Self, Pharmacy Records, Multiple Informants  fluticasone  (FLOVENT  HFA) 110 MCG/ACT inhaler [id number] Yes Inhale 1 puff into the lungs in the morning and at  bedtime. Luis Reyes DASEN, MD  Active   folic acid  (FOLVITE ) 1 MG tablet [id number] Yes Take 1 tablet (1 mg total) by mouth daily. Luis Kenney NOVAK, FNP  Active Self, Pharmacy Records, Multiple Informants  gabapentin  (NEURONTIN ) 300 MG capsule [id number] Yes Take 2 capsules (600 mg total) by mouth 2 (two) times daily. Joya Stabs, DPM  Active Self, Pharmacy Records, Multiple Informants  glucose blood (ACCU-CHEK GUIDE TEST) test strip [id number]  Use to test blood sugar once daily Luis [name], NP  Active Self, Pharmacy Records, Multiple Informants  guaiFENesin  (MUCINEX ) 600 MG 12 hr tablet [id number] Yes Take 1 tablet (600 mg total) by mouth 2 (two) times daily as needed to loosen phlegm or cough. Luis [name], NP  Active Self, Pharmacy Records, Multiple Informants           Med Note (LEE, NICOLE   Wed Dec 13, 2023  2:51 AM)    hydrOXYzine  (VISTARIL ) 50 MG capsule [id number] Yes Take 1 capsule (50 mg total) by mouth at bedtime as needed. Luis [name], NP  Active Self, Pharmacy Records, Multiple Informants    Discontinued 03/15/23 1105 (Change in therapy)   ipratropium-albuterol  (DUONEB) 0.5-2.5 (3) MG/3ML SOLN [id number] Yes Take 3 mLs by nebulization every 6 (six) hours as needed (  wheezing / sob). [provider]  Active Self, Pharmacy Records, Multiple Informants  losartan  (COZAAR ) 25 MG tablet [id number] Yes Take 1 tablet (25 mg total) by mouth daily. Gardenia Led, DO  Active Self, Pharmacy Records, Multiple Informants  metFORMIN  (GLUCOPHAGE ) 500 MG tablet [id number] Yes Take 2 tablets (1,000 mg total) by mouth daily with breakfast AND 1 tablet (500 mg total) daily with supper. Webb, Padonda B, FNP  Active   metoprolol  succinate (TOPROL -XL) 25 MG 24 hr tablet [id number] Yes Take 1 tablet (25 mg total) by mouth daily. Sabharwal, Aditya, DO  Active Self, Pharmacy Records, Multiple Informants  Tiotropium Bromide -Olodaterol 2.5-2.5 MCG/ACT AERS [id number]  Inhale 2 puffs into the lungs daily.  Hope Almarie ORN, NP  Active Self, Pharmacy Records, Multiple Informants  torsemide  (DEMADEX ) 20 MG tablet [id number] Yes Take 4 tablets (80 mg total) by mouth daily. Luis Kenney NOVAK, FNP  Active Self, Pharmacy Records, Multiple Informants  triamcinolone  cream (KENALOG ) 0.5 % [id number]  Apply topically to legs twice daily for up to 2 weeks as needed for eczema Luis [name], NP  Active Self, Pharmacy Records, Multiple Informants          Recommendation:   PCP Follow-up Specialty provider follow-up ostomy nurse/general surgery follow up as scheduled 12/26/23 Referral to: clinical pharmacist for education and assistance with medication management including medications for COPD Patient to call to reschedule follow up with Electrophysiology and pulmonology   Follow Up Plan:   RNCM completed referral to clinical pharmacist. Telephone follow up appointment date/time:  01/01/24 at 11:00 am  Heddy Shutter, RN, MSN, BSN, CCM [facility], Population Health Case Manager Phone: [phone]

## 2023-12-26 ENCOUNTER — Ambulatory Visit (HOSPITAL_COMMUNITY): Admitting: Nurse Practitioner

## 2023-12-26 ENCOUNTER — Encounter (HOSPITAL_COMMUNITY)

## 2023-12-26 ENCOUNTER — Ambulatory Visit (HOSPITAL_COMMUNITY)
Admission: RE | Admit: 2023-12-26 | Discharge: 2023-12-26 | Disposition: A | Discharge status: Home / Self Care | Source: Ambulatory Visit | Attending: Nurse Practitioner | Admitting: Nurse Practitioner

## 2023-12-26 DIAGNOSIS — L24B3 Irritant contact dermatitis related to fecal or urinary stoma or fistula: Secondary | ICD-10-CM

## 2023-12-26 DIAGNOSIS — K9413 Enterostomy malfunction: Secondary | ICD-10-CM

## 2023-12-26 DIAGNOSIS — Z7689 Persons encountering health services in other specified circumstances: Secondary | ICD-10-CM | POA: Diagnosis not present

## 2023-12-26 DIAGNOSIS — K9419 Other complications of enterostomy: Secondary | ICD-10-CM | POA: Diagnosis not present

## 2023-12-26 DIAGNOSIS — L98491 Non-pressure chronic ulcer of skin of other sites limited to breakdown of skin: Secondary | ICD-10-CM

## 2023-12-26 DIAGNOSIS — K469 Unspecified abdominal hernia without obstruction or gangrene: Secondary | ICD-10-CM | POA: Diagnosis not present

## 2023-12-26 DIAGNOSIS — Z432 Encounter for attention to ileostomy: Secondary | ICD-10-CM | POA: Diagnosis present

## 2023-12-26 NOTE — Progress Notes (Signed)
 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  Recent hospitalization for pneumonia HPI:  Diverticulitis with perforation and end ileostomy Past Medical History:  Diagnosis Date   Acute respiratory failure with hypoxia (HCC) 12/23/2022   Alcohol withdrawal syndrome, with delirium (HCC) 12/27/2022   COPD (chronic obstructive pulmonary disease) (HCC)    Diabetes mellitus without complication (HCC)    Diverticulitis    DVT (deep venous thrombosis) (HCC)    x3   Encephalopathy acute 12/28/2022   ETOH abuse    H/O colectomy    Hyperlipidemia    Hypertension    Sepsis associated hypotension (HCC) 08/03/2019   Smoker    Family History  Problem Relation Age of Onset   COPD Mother    Prostate cancer Father    Colon cancer Neg Hx    Rectal cancer Neg Hx    Stomach cancer Neg Hx    Esophageal cancer Neg Hx    Allergies  Allergen Reactions   Codeine Hives   Lisinopril Other (See Comments)    Other reaction(s): renal effects   Current Outpatient Medications  Medication Sig Dispense Refill Last Dose/Taking   Accu-Chek Softclix Lancets lancets Use to test blood sugar once daily 100 each 11    acetaminophen  (ACETAMINOPHEN  8 HOUR) 650 MG CR tablet Take 1 tablet (650 mg total) by mouth every 8 (eight) hours as needed for pain 90 tablet 1    ascorbic acid  (VITAMIN C ) 500 MG tablet Take 1 tablet (500 mg total) by mouth daily. 30 tablet 1    atorvastatin  (LIPITOR) 10 MG tablet Take 1 tablet (10 mg total) by mouth daily. 90 tablet 1    cetirizine  (ZYRTEC ) 10 MG tablet Take 1 tablet (10 mg total) by mouth daily. 90 tablet 1    dapagliflozin  propanediol (FARXIGA ) 10 MG TABS tablet Take 1 tablet (10 mg total) by mouth daily before breakfast. 90 tablet 2    fluticasone  (FLOVENT  HFA) 110 MCG/ACT inhaler Inhale 1 puff into the lungs in the morning and at bedtime. 12 g 2    folic acid  (FOLVITE ) 1 MG tablet Take 1 tablet (1 mg  total) by mouth daily. 90 tablet 0    gabapentin  (NEURONTIN ) 300 MG capsule Take 2 capsules (600 mg total) by mouth 2 (two) times daily. 90 capsule 3    glucose blood (ACCU-CHEK GUIDE TEST) test strip Use to test blood sugar once daily 100 each 11    guaiFENesin  (MUCINEX ) 600 MG 12 hr tablet Take 1 tablet (600 mg total) by mouth 2 (two) times daily as needed to loosen phlegm or cough. 180 tablet 1    hydrOXYzine  (VISTARIL ) 50 MG capsule Take 1 capsule (50 mg total) by mouth at bedtime as needed. 30 capsule 2    ipratropium-albuterol  (DUONEB) 0.5-2.5 (3) MG/3ML SOLN Take 3 mLs by nebulization every 6 (six) hours as needed (wheezing / sob).      losartan  (COZAAR ) 25 MG tablet Take 1 tablet (25 mg total) by mouth daily. 30 tablet 5    metFORMIN  (GLUCOPHAGE ) 500 MG tablet Take 2 tablets (1,000 mg total) by mouth daily with breakfast AND 1 tablet (500 mg total) daily with supper. 90 tablet 3    metoprolol  succinate (TOPROL -XL) 25 MG 24 hr tablet Take 1 tablet (25 mg total) by mouth daily. 90 tablet 3    Tiotropium Bromide -Olodaterol 2.5-2.5 MCG/ACT AERS Inhale 2 puffs into the lungs daily. 4 g 3  torsemide  (DEMADEX ) 20 MG tablet Take 4 tablets (80 mg total) by mouth daily. 120 tablet 3    triamcinolone  cream (KENALOG ) 0.5 % Apply topically to legs twice daily for up to 2 weeks as needed for eczema 30 g 1    No current facility-administered medications for this encounter.   ROS  Review of Systems  Constitutional:  Positive for fatigue.  Respiratory:  Positive for shortness of breath and wheezing.        Recent hospitalization for pneumonia CHF smoker  Cardiovascular:  Positive for leg swelling.       CHF  Gastrointestinal:        Ileostomy Abdominal hernia  Musculoskeletal:  Positive for myalgias.  Skin:  Positive for color change, rash and wound.       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    All  other systems reviewed and are negative.  Vital signs:  BP 126/74   Temp 98 F (36.7 C) (Oral)   Resp 18   SpO2 93%  Exam:  Physical Exam Vitals reviewed.  Constitutional:      Appearance: He is obese.  HENT:     Mouth/Throat:     Mouth: Mucous membranes are moist.  Cardiovascular:     Rate and Rhythm: Normal rate and regular rhythm.  Pulmonary:     Breath sounds: Wheezing present.     Comments: Shortness of breath with exertion Abdominal:     Hernia: A hernia is present.  Musculoskeletal:     Comments: weakness  Skin:    General: Skin is warm and dry.     Findings: Erythema and lesion present.  Neurological:     Mental Status: He is alert and oriented to person, place, and time. Mental status is at baseline.  Psychiatric:        Mood and Affect: Mood normal.        Behavior: Behavior normal.     Stoma type/location:  RLQ ileostomy Stomal assessment/size:  1 flush pink and moist Peristomal assessment:  skin breakdown  Treatment options for stomal/peristomal skin: stoma powder and skin prep  barrier ring and 1piece flat pouch  Output: liquid brown stool  Ostomy pouching: 1pc. Flat convatec pouch  Education provided:  wound care to abdomen, some intertriginous dermatitis to abdominal pannus.  Coated with calmoseptine barrier ointment.     Impression/dx  Nonhealing wound Ileostomy Abdominal hernia Discussion  No changes Plan  Continue aquacel to open wound/abdomen twice weekly.  No changes in pouching .     Visit time: 45 minutes.   Darice Cooley FNP-BC

## 2023-12-27 ENCOUNTER — Other Ambulatory Visit (HOSPITAL_COMMUNITY): Payer: Self-pay

## 2023-12-27 ENCOUNTER — Telehealth: Payer: Self-pay

## 2023-12-27 NOTE — Telephone Encounter (Signed)
 Left voice mail for pt to call back in regards to medication

## 2023-12-27 NOTE — Progress Notes (Signed)
 Care Guide Pharmacy Note  12/27/2023 Name: Luis [name] MRN: [id number] DOB: 12-20-1960  Referred By: Elnor [name], NP Reason for referral: Complex Care Management (Outreach to schedule with Pharm d )   Luis [name] is a 64 y.o. year old male who is a primary care patient of Elnor [name], NP.  Luis [name] was referred to the pharmacist for assistance related to: COPD  An unsuccessful telephone outreach was attempted today to contact the patient who was referred to the pharmacy team for assistance with medication management. Additional attempts will be made to contact the patient.  Jeoffrey Buffalo , RMA     [facility]  [facility], [facility] Guide  Direct Dial: [phone]  Website: [email or web address]

## 2023-12-27 NOTE — Progress Notes (Signed)
 Care Guide Pharmacy Note  12/27/2023 Name: Luis [name] MRN: [id number] DOB: May 19, 1961  Referred By: Elnor [name], NP Reason for referral: Complex Care Management (Outreach to schedule with Pharm d )   Luis [name] is a 63 y.o. year old male who is a primary care patient of Elnor [name], NP.  Luis [name] was referred to the pharmacist for assistance related to: COPD  Successful contact was made with the patient to discuss pharmacy services.  Patient declines engagement at this time. Contact information was provided to the patient should they wish to reach out for assistance at a later time.  Luis [name] , RMA     [facility]  [facility], [facility] Guide  Direct Dial: [phone]  Website: [email or web address]

## 2023-12-28 ENCOUNTER — Encounter (HOSPITAL_COMMUNITY)

## 2023-12-28 ENCOUNTER — Ambulatory Visit: Payer: Self-pay | Admitting: Family Medicine

## 2023-12-28 ENCOUNTER — Encounter: Payer: Self-pay | Admitting: Family Medicine

## 2023-12-28 ENCOUNTER — Ambulatory Visit

## 2023-12-28 ENCOUNTER — Other Ambulatory Visit (HOSPITAL_COMMUNITY): Payer: Self-pay | Admitting: Cardiology

## 2023-12-28 ENCOUNTER — Other Ambulatory Visit (HOSPITAL_COMMUNITY): Payer: Self-pay

## 2023-12-28 ENCOUNTER — Other Ambulatory Visit: Payer: Self-pay

## 2023-12-28 ENCOUNTER — Ambulatory Visit: Admitting: Family Medicine

## 2023-12-28 VITALS — BP 110/82 | HR 75 | Temp 98.1°F | Ht 73.0 in | Wt 229.8 lb

## 2023-12-28 DIAGNOSIS — F1020 Alcohol dependence, uncomplicated: Secondary | ICD-10-CM

## 2023-12-28 DIAGNOSIS — J189 Pneumonia, unspecified organism: Secondary | ICD-10-CM | POA: Diagnosis not present

## 2023-12-28 DIAGNOSIS — Z932 Ileostomy status: Secondary | ICD-10-CM

## 2023-12-28 DIAGNOSIS — I5042 Chronic combined systolic (congestive) and diastolic (congestive) heart failure: Secondary | ICD-10-CM | POA: Diagnosis not present

## 2023-12-28 DIAGNOSIS — Z7689 Persons encountering health services in other specified circumstances: Secondary | ICD-10-CM | POA: Diagnosis not present

## 2023-12-28 DIAGNOSIS — M10479 Other secondary gout, unspecified ankle and foot: Secondary | ICD-10-CM

## 2023-12-28 DIAGNOSIS — D582 Other hemoglobinopathies: Secondary | ICD-10-CM

## 2023-12-28 DIAGNOSIS — M109 Gout, unspecified: Secondary | ICD-10-CM | POA: Insufficient documentation

## 2023-12-28 DIAGNOSIS — I10 Essential (primary) hypertension: Secondary | ICD-10-CM | POA: Diagnosis not present

## 2023-12-28 DIAGNOSIS — J449 Chronic obstructive pulmonary disease, unspecified: Secondary | ICD-10-CM | POA: Diagnosis not present

## 2023-12-28 DIAGNOSIS — E1142 Type 2 diabetes mellitus with diabetic polyneuropathy: Secondary | ICD-10-CM

## 2023-12-28 DIAGNOSIS — R079 Chest pain, unspecified: Secondary | ICD-10-CM | POA: Diagnosis not present

## 2023-12-28 DIAGNOSIS — F17200 Nicotine dependence, unspecified, uncomplicated: Secondary | ICD-10-CM

## 2023-12-28 DIAGNOSIS — R0602 Shortness of breath: Secondary | ICD-10-CM | POA: Diagnosis not present

## 2023-12-28 LAB — COMPREHENSIVE METABOLIC PANEL WITH GFR
ALT: 57 U/L — ABNORMAL HIGH (ref 0–53)
AST: 37 U/L (ref 0–37)
Albumin: 4.8 g/dL (ref 3.5–5.2)
Alkaline Phosphatase: 169 U/L — ABNORMAL HIGH (ref 39–117)
BUN: 65 mg/dL — ABNORMAL HIGH (ref 6–23)
CO2: 30 meq/L (ref 19–32)
Calcium: 10.8 mg/dL — ABNORMAL HIGH (ref 8.4–10.5)
Chloride: 93 meq/L — ABNORMAL LOW (ref 96–112)
Creatinine, Ser: 1.77 mg/dL — ABNORMAL HIGH (ref 0.40–1.50)
GFR: 40.12 mL/min — ABNORMAL LOW (ref >60.00)
Glucose, Bld: 151 mg/dL — ABNORMAL HIGH (ref 70–99)
Potassium: 4.1 meq/L (ref 3.5–5.1)
Sodium: 135 meq/L (ref 135–145)
Total Bilirubin: 0.6 mg/dL (ref 0.2–1.2)
Total Protein: 8.5 g/dL — ABNORMAL HIGH (ref 6.0–8.3)

## 2023-12-28 LAB — CBC WITH DIFFERENTIAL/PLATELET
Basophils Absolute: 0.2 K/uL — ABNORMAL HIGH (ref 0.0–0.1)
Basophils Relative: 1.2 % (ref 0.0–3.0)
Eosinophils Absolute: 0.3 K/uL (ref 0.0–0.7)
Eosinophils Relative: 2.4 % (ref 0.0–5.0)
HCT: 42.1 % (ref 39.0–52.0)
Hemoglobin: 14 g/dL (ref 13.0–17.0)
Lymphocytes Relative: 13.8 % (ref 12.0–46.0)
Lymphs Abs: 1.8 K/uL (ref 0.7–4.0)
MCHC: 33.2 g/dL (ref 30.0–36.0)
MCV: 102.8 fl — ABNORMAL HIGH (ref 78.0–100.0)
Monocytes Absolute: 1.2 K/uL — ABNORMAL HIGH (ref 0.1–1.0)
Monocytes Relative: 9.1 % (ref 3.0–12.0)
Neutro Abs: 9.4 K/uL — ABNORMAL HIGH (ref 1.4–7.7)
Neutrophils Relative %: 73.5 % (ref 43.0–77.0)
Platelets: 256 K/uL (ref 150.0–400.0)
RBC: 4.09 Mil/uL — ABNORMAL LOW (ref 4.22–5.81)
RDW: 17 % — ABNORMAL HIGH (ref 11.5–15.5)
WBC: 12.7 K/uL — ABNORMAL HIGH (ref 4.0–10.5)

## 2023-12-28 LAB — BRAIN NATRIURETIC PEPTIDE: Pro B Natriuretic peptide (BNP): 100 pg/mL (ref 0.0–100.0)

## 2023-12-28 LAB — VITAMIN B12: Vitamin B-12: 474 pg/mL (ref 211–911)

## 2023-12-28 LAB — FOLATE: Folate: >23.4 ng/mL (ref >5.9)

## 2023-12-28 MED ORDER — LOSARTAN POTASSIUM 25 MG PO TABS
25.0000 mg | ORAL_TABLET | Freq: Every day | ORAL | 5 refills | Status: DC
  Filled 2023-12-28: qty 30, 30d supply, fill #0
  Filled 2024-01-01: qty 63, 63d supply, fill #1
  Filled 2024-01-27: qty 30, 30d supply, fill #1

## 2023-12-28 NOTE — Progress Notes (Signed)
 Acute Office Visit  Subjective:     Patient ID: Luis [name], male    DOB: 04/11/1961, 63 y.o.   MRN: [id number]  Chief Complaint  Patient presents with   Hospitalization Follow-up    Excessive fatigue, still having a deep cough,     HPI  Discussed the use of AI scribe software for clinical note transcription with the patient, who gave verbal consent to proceed.  Admitted to [facility] 12/12/23-12/16/23 for CAP, CHF, chronic complicated abdominal wound, colostomy present TOC 12/21/23  History of Present Illness Luis [name] is a 63 year old male who presents with fatigue and respiratory concerns.  Fatigue and sleep disturbance - Significant fatigue following recent hospitalization with removal of 30 pounds of fluid over five days - Current weight is 229 pounds with clothes and personal items; discharged at 225 pounds - Persistent tiredness despite nightly use of gabapentin , hydroxyzine , melatonin, and Tylenol  - Occasional difficulty sleeping  Respiratory symptoms - Ongoing congestion despite use of Mucinex  (two doses in 24 hours), which provides some relief - History of pneumonia managed at home prior to hospitalization - Hallucinations during hospital stay, attributed to pneumonia  Gastrointestinal symptoms - Ostomy bag leakage due to improper taping, described as 'loose' and 'weeping', requiring frequent changes - Anxiety regarding potential nighttime leaks from ostomy bag - Morning nausea and daily episodes of feeling sick to the stomach - Occasional heartburn, managed with Tums  Musculoskeletal symptoms - Recent episode of gout in the foot, currently improved - Residual foot pain and balance issues persist, but able to ambulate     ROS Per HPI      Objective:    BP 110/82 (BP Location: Left Arm, Patient Position: Sitting)   Pulse 75   Temp 98.1 F (36.7 C) (Temporal)   Ht 6' 1 (1.854 m)   Wt 229 lb 12.8 oz (104.2 kg)   SpO2 93%   BMI 30.32 kg/m     Physical Exam Vitals and nursing note reviewed.  Constitutional:      General: He is not in acute distress. HENT:     Head: Normocephalic and atraumatic.     Right Ear: External ear normal.     Left Ear: External ear normal.     Nose: Nose normal.     Mouth/Throat:     Mouth: Mucous membranes are moist.     Pharynx: Oropharynx is clear.  Eyes:     Extraocular Movements: Extraocular movements intact.  Cardiovascular:     Rate and Rhythm: Normal rate and regular rhythm.     Pulses: Normal pulses.     Heart sounds: Normal heart sounds.  Pulmonary:     Effort: Pulmonary effort is normal. No respiratory distress.     Breath sounds: Examination of the right-lower field reveals decreased breath sounds. Decreased breath sounds present. No wheezing, rhonchi or rales.  Abdominal:     General: The ostomy site is clean.      Comments: Circle area of colostomy, beefy red stoma, bag intact with good output Square: dressing in place  Musculoskeletal:        General: Normal range of motion.     Cervical back: Normal range of motion.     Right lower leg: No edema.     Left lower leg: No edema.  Lymphadenopathy:     Cervical: No cervical adenopathy.  Skin:    General: Skin is warm and dry.  Neurological:     General: No focal deficit present.  Mental Status: He is alert and oriented to person, place, and time.  Psychiatric:        Mood and Affect: Mood normal.        Behavior: Behavior normal.     No results found for any visits on 12/28/23.      Assessment & Plan:   Assessment and Plan Assessment & Plan CHF, HTN Fatigue likely due to rapid fluid removal and ongoing diuretic use. - Order chest x-ray to assess for fluid accumulation or pulmonary issues.  CAP, COPD Fatigue and respiratory symptoms persist despite previous treatment. Further investigation needed. - Order chest x-ray to confirm pneumonia resolution. - Consider additional antibiotics if pneumonia  persists.  Colostomy Issues with ostomy bag placement and leakage, risking wound contamination. - Advise changing ostomy setup at home and monitor for leakage.  Gout Recent gout episode resolved, mild residual foot pain remains. - Follow up with foot doctor next Thursday for evaluation and management.  Heartburn Intermittent heartburn managed with Tums, no additional medication needed unless symptoms worsen. - Continue using Tums as needed.  Macrocytosis - CBC, folate, thiamine , B12  Tobacco Use - Not ready to quit  Alcoholism - Not ready to quit  T2DM - Controlled   Orders Placed This Encounter  Procedures   DG Chest 2 View    Standing Status:   Future    Number of Occurrences:   1    Expiration Date:   06/29/2024    Reason for Exam (SYMPTOM  OR DIAGNOSIS REQUIRED):   CAP, SOB, decreased breath sounds    Preferred imaging location?:   [place]   CBC with Differential/Platelet    Release to patient:   Immediate [1]   Comprehensive metabolic panel with GFR    Release to patient:   Immediate [1]   B Nat Peptide   Folate   Vitamin B12   Vitamin B1     No orders of the defined types were placed in this encounter.   Return if symptoms worsen or fail to improve, for with pcp as scheduled.  Corean LITTIE Ku, FNP

## 2023-12-28 NOTE — Patient Instructions (Signed)
 We are checking labs today, will be in contact with any results that require further attention  Continue current medication regimen  Will advise on amlodipine  and torsemide  once labs result  We are getting an xray today. We will be in contact with any abnormal results that require further attention.  If there is pneumonia seen on xray, will consider oral antibiotic  Cough with pneumonia can linger for another couple of weeks. It should continue to gradually get better.   Follow-up with me for new or worsening symptoms.  Follow up with PCP as scheduled.

## 2023-12-29 ENCOUNTER — Other Ambulatory Visit (HOSPITAL_COMMUNITY): Payer: Self-pay

## 2024-01-01 ENCOUNTER — Ambulatory Visit (INDEPENDENT_AMBULATORY_CARE_PROVIDER_SITE_OTHER): Payer: Self-pay

## 2024-01-01 ENCOUNTER — Other Ambulatory Visit: Payer: Self-pay | Admitting: Nurse Practitioner

## 2024-01-01 ENCOUNTER — Other Ambulatory Visit: Payer: Self-pay | Admitting: Family

## 2024-01-01 ENCOUNTER — Other Ambulatory Visit: Payer: Self-pay

## 2024-01-01 ENCOUNTER — Other Ambulatory Visit (HOSPITAL_COMMUNITY): Payer: Self-pay

## 2024-01-01 ENCOUNTER — Telehealth (HOSPITAL_COMMUNITY): Payer: Self-pay | Admitting: *Deleted

## 2024-01-01 ENCOUNTER — Other Ambulatory Visit: Payer: Self-pay | Admitting: Podiatry

## 2024-01-01 DIAGNOSIS — I5042 Chronic combined systolic (congestive) and diastolic (congestive) heart failure: Secondary | ICD-10-CM

## 2024-01-01 DIAGNOSIS — L98491 Non-pressure chronic ulcer of skin of other sites limited to breakdown of skin: Secondary | ICD-10-CM | POA: Insufficient documentation

## 2024-01-01 DIAGNOSIS — E1169 Type 2 diabetes mellitus with other specified complication: Secondary | ICD-10-CM

## 2024-01-01 DIAGNOSIS — I442 Atrioventricular block, complete: Secondary | ICD-10-CM | POA: Diagnosis not present

## 2024-01-01 MED ORDER — CETIRIZINE HCL 10 MG PO TABS
10.0000 mg | ORAL_TABLET | Freq: Every day | ORAL | 1 refills | Status: DC
  Filled 2024-01-01: qty 90, 90d supply, fill #0
  Filled 2024-01-14: qty 30, 30d supply, fill #0
  Filled 2024-02-12: qty 30, 30d supply, fill #1

## 2024-01-01 MED ORDER — GABAPENTIN 300 MG PO CAPS
600.0000 mg | ORAL_CAPSULE | Freq: Two times a day (BID) | ORAL | 3 refills | Status: DC
  Filled 2024-01-01: qty 90, 23d supply, fill #0

## 2024-01-01 MED ORDER — ATORVASTATIN CALCIUM 10 MG PO TABS
10.0000 mg | ORAL_TABLET | Freq: Every day | ORAL | 1 refills | Status: DC
  Filled 2024-01-01 – 2024-02-17 (×2): qty 90, 90d supply, fill #0

## 2024-01-01 MED ORDER — DAPAGLIFLOZIN PROPANEDIOL 10 MG PO TABS
10.0000 mg | ORAL_TABLET | Freq: Every day | ORAL | 2 refills | Status: DC
Start: 2024-01-01 — End: 2024-02-23
  Filled 2024-01-01 – 2024-02-17 (×2): qty 90, 90d supply, fill #0

## 2024-01-01 NOTE — Discharge Instructions (Signed)
 Continue aquacel Treat skin breakdown with calmoseptine as needed.

## 2024-01-01 NOTE — Patient Outreach (Signed)
 Complex Care Management   Visit Note  01/01/2024  Name:  Luis [name] MRN: [id number] DOB: [birth year]  Situation: Referral received for Complex Care Management related to Heart Failure and COPD I obtained verbal consent from Patient.  Visit completed with patient  on the phone  Background:   Past Medical History:  Diagnosis Date   Acute respiratory failure with hypoxia (HCC) 12/23/2022   Alcohol withdrawal syndrome, with delirium (HCC) 12/27/2022   COPD (chronic obstructive pulmonary disease) (HCC)    Diabetes mellitus without complication (HCC)    Diverticulitis    DVT (deep venous thrombosis) (HCC)    x3   Encephalopathy acute 12/28/2022   ETOH abuse    H/O colectomy    Hyperlipidemia    Hypertension    Sepsis associated hypotension (HCC) 08/03/2019   Smoker     Assessment: patient reports in some ways improved adding he can walk better and balance is better. However patient expresses concern that he is gradually gaining weight. RNCM assisted patient with scheduleling appointment with advanced HF clinic. He denies any signs/symptoms of HF exacerbation.  Patient Reported Symptoms:  Cognitive Cognitive Status: No symptoms reported, Insightful and able to interpret abstract concepts, Alert and oriented to person, place, and time      Neurological Neurological Review of Symptoms: No symptoms reported    HEENT HEENT Symptoms Reported: No symptoms reported      Cardiovascular Cardiovascular Symptoms Reported: Fatigue Other Cardiovascular Symptoms: denies swelling in legs. denies dizziness, But states he feels like his weight is going up. I can walk just perfectly, I got balance, I am not dizzy now and have perfect balance. However, patient states he does not feel he is peeing enough and reiterated that he feels like he is slowly gaining weight. He reports weight at dicharge 224lb and weight today 231lb. patient has not had follow up with HF clinic since discharge from the  hospital. Patient has not been weighing daily. RNCM encouraged to keep up with daily weights and record weights. Conferance call with patient and Advance HF clinic provider nurse, Deniece) completed. Appointment scheduled for 01/05/24. Does patient have uncontrolled Hypertension?: No    Respiratory Other Respiratory Symptoms: reports cough is improved some    Endocrine Endocrine Symptoms Reported: No symptoms reported Is patient diabetic?: Yes Is patient checking blood sugars at home?: No List most recent blood sugar readings, include date and time of day: Patient reports has not checked BS. states has had some work done on the flooring in his house and has to find the meter. RNCM advised to obtain and begin checking. discussed importance of controlled BS in overall health with other medication conditions, and importance of controlled BS with the healing process.    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 He continues to attend visits with ostomy nurse. next visit scheduled for 01/05/24      Genitourinary Genitourinary Symptoms Reported: Other Other Genitourinary Symptoms: Patient reports sometimes has good urine output and other times he does not feel like he urinates what he thinks he should. advised to follow up with primary provider. patient has an upcoming appointment with [facility] clinic on 01/05/24. advised to follow up with primary provider. Genitourinary Self-Management Outcome: 3 (uncertain)  Integumentary Additional Integumentary Details: patientdenies any concerns/issues at this time.He reports unchanged and scheduled to see ostomy nurse 01/05/24.    Musculoskeletal Musculoskelatal Symptoms Reviewed: No symptoms reported Additional Musculoskeletal Details: Patient continues to express, I  can walk now. I got my balance back.        Psychosocial Psychosocial Symptoms  Reported: No symptoms reported Other Psychosocial Conditions: follow up appoinmtent scheduled for 01/04/24            10/25/2023    1:46 PM  Depression screen PHQ 2/9  Decreased Interest 1  Down, Depressed, Hopeless 1  PHQ - 2 Score 2  Altered sleeping 1  Tired, decreased energy 1  Change in appetite 0  Feeling bad or failure about yourself  1  Trouble concentrating 1  Moving slowly or fidgety/restless 0  Suicidal thoughts 0  PHQ-9 Score 6  Difficult doing work/chores Somewhat difficult    There were no vitals filed for this visit.  Medications Reviewed Today     Reviewed by Yadriel Kerrigan M, RN (Registered Nurse) on 01/01/24 at 1153  Med List Status: <None>   Medication Order Taking? Sig Documenting Provider Last Dose Status Informant  Accu-Chek Softclix Lancets lancets [id number]  Use to test blood sugar once daily Elnor [name], NP  Active Self, Pharmacy Records, Multiple Informants  acetaminophen  (ACETAMINOPHEN  8 HOUR) 650 MG CR tablet [id number] Yes Take 1 tablet (650 mg total) by mouth every 8 (eight) hours as needed for pain   Active Self, Pharmacy Records, Multiple Informants           Med Note KARLYNN, Denym Christenberry M   Tue Sep 12, 2023  1:49 PM) Reports takes 1200 mg at 6am, 1200mg  at 12N, 1200mg  at 6pm and 1200mg  again.  ascorbic acid  (VITAMIN C ) 500 MG tablet [id number] Yes Take 1 tablet (500 mg total) by mouth daily.   Active Self, Pharmacy Records, Multiple Informants  atorvastatin  (LIPITOR) 10 MG tablet [id number] Yes Take 1 tablet (10 mg total) by mouth daily. Elnor [name], NP  Active Self, Pharmacy Records, Multiple Informants  cetirizine  (ZYRTEC ) 10 MG tablet [id number] Yes Take 1 tablet (10 mg total) by mouth daily. Elnor [name], NP  Active Self, Pharmacy Records, Multiple Informants  dapagliflozin  propanediol (FARXIGA ) 10 MG TABS tablet [id number] Yes Take 1 tablet (10 mg total) by mouth daily before breakfast. Elnor [name], NP  Active Self, Pharmacy Records, Multiple  Informants  fluticasone  (FLOVENT  HFA) 110 MCG/ACT inhaler [id number] Yes Inhale 1 puff into the lungs in the morning and at bedtime. Danton Reyes DASEN, MD  Active   folic acid  (FOLVITE ) 1 MG tablet [id number] Yes Take 1 tablet (1 mg total) by mouth daily. Douglass Kenney NOVAK, FNP  Active Self, Pharmacy Records, Multiple Informants  gabapentin  (NEURONTIN ) 300 MG capsule [id number] Yes Take 2 capsules (600 mg total) by mouth 2 (two) times daily. Joya Stabs, DPM  Active Self, Pharmacy Records, Multiple Informants  glucose blood (ACCU-CHEK GUIDE TEST) test strip [id number]  Use to test blood sugar once daily Elnor [name], NP  Active Self, Pharmacy Records, Multiple Informants  guaiFENesin  (MUCINEX ) 600 MG 12 hr tablet [id number] Yes Take 1 tablet (600 mg total) by mouth 2 (two) times daily as needed to loosen phlegm or cough. Elnor [name], NP  Active Self, Pharmacy Records, Multiple Informants           Med Note (LEE, NICOLE   Wed Dec 13, 2023  2:51 AM)    hydrOXYzine  (VISTARIL ) 50 MG capsule [id number] Yes Take 1 capsule (50 mg total) by mouth at bedtime as needed. Elnor [name], NP  Active Self, Pharmacy Records, Multiple Informants    Discontinued 03/15/23 1105 (Change in therapy)  ipratropium-albuterol  (DUONEB) 0.5-2.5 (3) MG/3ML SOLN [id number] Yes Take 3 mLs by nebulization every 6 (six) hours as needed (wheezing / sob). [provider]  Active Self, Pharmacy Records, Multiple Informants  losartan  (COZAAR ) 25 MG tablet [id number] Yes Take 1 tablet (25 mg total) by mouth daily. Sabharwal, Aditya, DO  Active   metFORMIN  (GLUCOPHAGE ) 500 MG tablet [id number] Yes Take 2 tablets (1,000 mg total) by mouth daily with breakfast AND 1 tablet (500 mg total) daily with supper. Webb, Padonda B, FNP  Active   metoprolol  succinate (TOPROL -XL) 25 MG 24 hr tablet [id number] Yes Take 1 tablet (25 mg total) by mouth daily. Sabharwal, Aditya, DO  Active Self, Pharmacy Records, Multiple Informants  Tiotropium  Bromide-Olodaterol 2.5-2.5 MCG/ACT AERS [id number] Yes Inhale 2 puffs into the lungs daily. Hope Almarie ORN, NP  Active Self, Pharmacy Records, Multiple Informants  torsemide  (DEMADEX ) 20 MG tablet [id number] Yes Take 4 tablets (80 mg total) by mouth daily. Douglass Kenney NOVAK, FNP  Active Self, Pharmacy Records, Multiple Informants  triamcinolone  cream (KENALOG ) 0.5 % [id number] Yes Apply topically to legs twice daily for up to 2 weeks as needed for eczema Elnor [name], NP  Active Self, Pharmacy Records, Multiple Informants          Recommendation:   Specialty provider follow-up HF clinic and ostomy nurse Patient to discussed restarting cardiac rehab clinic with provider  Follow Up Plan:   Telephone follow up appointment date/time:  01/11/24 at 11:00 am  Heddy Shutter, RN, MSN, BSN, CCM [place]  [facility], Population Health Case Manager Phone: [phone]

## 2024-01-01 NOTE — Telephone Encounter (Signed)
 Pt and CM called, pt has concerns with wt gain. Pt recently hosp with pna and HF and was diuresed 30 lbs, d/c wt was 225 7/5, today wt is at 231 lbs which has been a gradual increase since d/c. Reports cough is continuing, sob is relatively at baseline and not worse, has been compliant with meds (Tor 80 mg daily, Farxiaga 10 mg daily) saw PCP last week with cxray which was normal.  Pt is sch for post hosp appt on Fri 7/25 offered sooner appt for wed 7/23 however pt wanted to wait until Fri. We discussed daily weights and he is agreeable to call our office if wt continues to increase

## 2024-01-01 NOTE — Patient Instructions (Signed)
 Visit Information  [name] was given information about Medicaid Managed Care team care coordination services as a part of their [facility] Medicaid benefit. [name] verbally consented to engagement with the [facility] Managed Care team.   If you are experiencing a medical emergency, please call 911 or report to your local emergency department or urgent care.   If you have a non-emergency medical problem during routine business hours, please contact your provider's office and ask to speak with a nurse.   For questions related to your [facility] health plan, please call: [phone] or go here:[email or web address]  If you would like to schedule transportation through your [facility] plan, please call the following number at least 2 days in advance of your appointment: [phone].   You can also use the MTM portal or MTM mobile app to manage your rides. Reimbursement for transportation is available through [facility]! For the portal, please go to [email or web address]  Call the [facility] Crisis Line at [phone], at any time, 24 hours a day, 7 days a week. If you are in danger or need immediate medical attention call 911.  If you would like help to quit smoking, call 1-800-QUIT-NOW (8177631337) OR Espaol: 1-855-Djelo-Ya (8-144-664-6430) o para ms informacin haga clic aqu or Text READY to 799-599 to register via text  [name] - following are the goals we discussed in your visit today:   Goals Addressed             This Visit's Progress    VBCI RN Care Plan-COPD       Problems:  Chronic Disease Management support and education needs related to COPD Lacks caregiver support. Non-adherence to prescribed medication regimen  Goal: Over the next 90 days the Patient will attend all scheduled medical appointments: including ostomy nurse 01/05/24; advance HF clinic 01/05/24; Podiatrist 01/03/24; electrophysiology 01/25/24; pulmonary lung screening  completed on 11/20/23  as evidenced by patient report and review of chart        continue to work with RN Care Manager and/or Social Worker to address care management and care coordination needs related to COPD as evidenced by adherence to care management team scheduled appointments     demonstrate Improved adherence to prescribed treatment plan for COPD as evidenced by patient report and review of chart take all medications exactly as prescribed and will call provider for medication related questions as evidenced by patient report and/or review of chart(patient declined scheduling of clinical pharmacist appointment     Interventions: COPD Interventions: Discussed the importance of adequate rest and management of fatigue with COPD Assess medical status related to COPD.  Medications reviewed and encouraged to take as prescribed.  Patient Self-Care Activities:  Attend all scheduled provider appointments Call provider office for new concerns or questions  Take medications as prescribed   Work with the social worker to address care coordination needs and will continue to work with the clinical team to address health care and disease management related needs  Plan:  Telephone follow up appointment with care management team member scheduled for:  01/05/24     VBCI RN Care Plan-HF       Problems:  Chronic Disease Management support and education needs related to CHF  Goal: Over the next 90 days the Patient will attend all scheduled medical appointments: including ostomy nurse 01/05/24; advance HF clinic 01/05/24; Podiatrist 01/03/24; electrophysiology 01/25/24; pulmonary lung screening completed on 11/20/23. Patient to discuss restarting with provider.  as evidenced by patient report and review of chart  continue to work with Medical illustrator and/or Social Worker to address care management and care coordination needs related to CHF as evidenced by adherence to care management team scheduled  appointments     demonstrate Improved adherence to prescribed treatment plan for CHF as evidenced by  patient report or review of chart  Interventions:   Heart Failure Interventions: Discussed the importance of keeping all appointments with provider Reiterated with patient the importance of daily weights. Advised to provider if worsening of condition advised patient to continue to weight and record weight. Notify provider, cardiology and/or Primary care provider if weights increased 2-3 pounds overnight or 5 pounds in a week. Reviewed upcoming appointments Medications reviewed with patient Referral to clinical pharmacist regarding education and assistance with medication management- patient declined when CMA called to schedule. RNCM called and completed 3 way call with advance HF nurse and patient regarding patient concerns/symptoms. Appointment scheduled for 01/05/24. Patient instructed to call HF clinic if weights continue to increase or other worsening symptoms. Medications reviewed. RNCM reiterated the importance of taking as prescribed.  Patient Self-Care Activities:  Attend all scheduled provider appointments Call provider office for new concerns or questions  Take medications as prescribed   Work with the social worker to address care coordination needs and will continue to work with the clinical team to address health care and disease management related needs call office if I gain more than 2 pounds in one day or 5 pounds in one week and follow instructions per HF clinic for as needed torsemide . Do the following things EVERYDAY: Weigh yourself in the morning before breakfast. Write it down and keep it in a log. Take your medicines as prescribed Eat low salt foods--Limit salt (sodium) to 2000 mg per day.  Stay as active as you can everyday Limit all fluids for the day to less than 2 liters  Plan:  Telephone follow up appointment with care management team member scheduled for:   01/11/24 at 11:00 am       Please see education materials related to Heart failure provided by MyChart link.  Patient verbalizes understanding of instructions and care plan provided today and agrees to view in MyChart. Active MyChart status and patient understanding of how to access instructions and care plan via MyChart confirmed with patient.     Telephone follow up appointment with Managed Medicaid care management team member scheduled for: 01/11/24 at 11 am.  Heddy Shutter, RN, MSN, BSN, CCM [place]  [facility], Population Health Case Manager Phone: [phone]   Heart Failure: Self-Care Heart failure is a serious condition. The information below explains things you need to do to take care of yourself at home. To help you stay as healthy as possible, you may be asked to change your diet, take certain medicines, and make other changes in your life. Your health care provider may also give you more instructions. If you have problems or questions, call your provider. What are the risks? Having heart failure makes it more likely for you to have some problems. These can get worse if you don't take good care of yourself. Problems may include: Damage to the kidneys, liver, or lungs. Not getting enough nutrients. This is called malnutrition. Heart rhythms that aren't normal. Problems with blood clotting that could cause a stroke. Supplies needed: A scale to keep track of your weight. A blood pressure monitor. A notebook. Medicines. How to care for yourself when you have heart failure Medicines Take your medicines only  as told. Take them every day. Do not stop taking your medicine unless your provider tells you to. Do not skip any dose of medicine. Get your prescriptions refilled before you run out of medicine. Talk with your health care provider if you can't afford your medicines. Eating and drinking  Eat heart-healthy foods. Talk with an expert in healthy eating  called a dietitian. They can help make an eating plan that's right for you. Limit salt (sodium) if told by your provider. Ask your dietitian to tell you which seasonings are healthy for your heart. Cook in healthy ways instead of frying. Healthy ways of cooking include roasting, grilling, broiling, baking, poaching, steaming, and stir-frying. Choose foods that have no trans fat and are low in saturated fat and cholesterol. Healthy choices include: Fresh or frozen fruits and vegetables. Fish. Lean meats. Legumes. Fat-free or low-fat dairy products. Whole-grain foods. High-fiber foods. Limit how much fluid you drink, if told by your provider. This may help with symptoms. Alcohol use Do not drink alcohol if: Your provider tells you not to drink. Your heart was damaged by alcohol, or you have very bad heart failure. You're pregnant, may be pregnant, or plan to become pregnant. If you drink alcohol: Limit how much you have to: 0-1 drink a day if you're male. 0-2 drinks a day if you're male. Know how much alcohol is in your drink. In the [place] one drink is one 12 oz bottle of beer (355 mL), one 5 oz glass of wine (148 mL), or one 1 oz glass of hard liquor (44 mL). Lifestyle Do not smoke, vape, or use nicotine  or tobacco. Do not use nicotine  gum or patches before talking to your provider. Do not use illegal drugs. Work with your provider to stay at a healthy body weight. Lose weight if told. Do physical activity if told by your provider. Talk to your provider before you begin an exercise if: You're an older adult. You have very bad heart failure. Learn to manage stress. If you need help, ask your provider. Get physical rehabilitation, also called rehab, as needed to help you stay independent and to help with your quality of life. Take part in a cardiac rehab program. This program helps to improve your health and well-being through exercise training, education, and counseling. Plan time  to rest when you get tired. Tracking important information  Weigh yourself every day. This will help you to know if fluid is building up in your body. Weigh yourself every morning after you pee (urinate) and before you eat breakfast. Wear the same amount of clothing each time you weigh yourself. Write down your daily weight. Give your record to your provider. Check and write down your pulse and blood pressure as told by your provider. Dealing with very hot and very cold weather If the weather is very hot: Avoid activities that take a lot of energy. Use air conditioning or fans, or find a cooler place. Avoid caffeine and alcohol. Wear clothes that are loose, lightweight, and light-colored. If the weather is very cold: Avoid activities that take a lot of energy. Layer your clothes. Wear mittens or gloves, a hat, and a face covering when you go outside. Avoid alcohol. Other tips Stay up to date with shots, or vaccines. Get pneumococcal and flu (influenza) shots. Keep all follow-up visits. Your provider will watch your symptoms and change your treatment plan as needed. Where to find more information American Heart Association: heart.org Contact a health care provider if:  You gain 2-3 lb (1-1.4 kg) in 24 hours or 5 lb (2.3 kg) in a week. You have shortness of breath that's getting worse. You're not able to do your usual physical activities. You get tired easily. You cough a lot. You don't feel like eating or you feel like you may throw up. You have swelling in your hands, feet, ankles, or belly. You're not able to sleep because it's hard to breathe. You feel like your heart is beating fast. This is called palpitations. You get dizzy when you stand up. You feel depressed or sad. Get help right away if: You have trouble breathing. You or someone else notices a change in your awareness, such as having trouble staying awake or concentrating. You have pain or discomfort in your  chest. You faint. These symptoms may be an emergency. Call 911 right away. Do not wait to see if the symptoms will go away. Do not drive yourself to the hospital. This information is not intended to replace advice given to you by your health care provider. Make sure you discuss any questions you have with your health care provider. Document Revised: 04/11/2023 Document Reviewed: 11/04/2022 Elsevier Patient Education  2024 [employer].

## 2024-01-02 ENCOUNTER — Other Ambulatory Visit (HOSPITAL_COMMUNITY): Payer: Self-pay

## 2024-01-02 ENCOUNTER — Encounter (HOSPITAL_COMMUNITY)

## 2024-01-02 DIAGNOSIS — T8189XA Other complications of procedures, not elsewhere classified, initial encounter: Secondary | ICD-10-CM | POA: Diagnosis not present

## 2024-01-02 DIAGNOSIS — K469 Unspecified abdominal hernia without obstruction or gangrene: Secondary | ICD-10-CM | POA: Diagnosis not present

## 2024-01-02 NOTE — Progress Notes (Signed)
 Advanced [facility] Clinic Note   PCP: Elnor [name], NP HF Cardiologist: Dr. Gardenia   HPI: Luis [name] is a 63 y.o.male with heart failure with preserved ejection fraction, complete heart block status post leadless permanent pacemaker, hypertension, hyperlipidemia, history of DVT, type 2 diabetes, tobacco use, history of alcohol use, diverticulitis status post colectomy with ileostomy complicated by wound infections.   He has had multiple hospitalizations in the past 1 year.  Admitted in 7/24 with hypoxic/hypercarbic respiratory failure secondary to pneumonia/COPD and diastolic heart failure exacerbation requiring intubation and mechanical ventilation.  He was also delirious at that time due to alcohol withdrawal.  Echo with EF 50 to 55% and grade 2 diastolic dysfunction.    Admitted 9/24 with respiratory failure 2/2 to COPD and HFpEF requiring IV steroids, antibiotics and diuresis. Since that time, he has been seen in [facility] clinic and referred to [facility], followed by Dr. Gardenia.   Admitted 2/25 for acute respiratory failure with hypoxia due to PNA, COPD exacerbation and a/c diastolic CHF. He was managed by internal medicine. Echo not repeated. Per hospital notes, he was aggressively diuresed, over 20 L, back to baseline. Pt had admitted to dietary indiscretion w/ sodium and not adhearing to fluid restriction at home prior to being admitted. Also admitted to drinking large amounts of alcohol. GDMT titrated and he was discharged home, weight 225 lbs.  Today he returns for HF follow up. Overall feeling great! He is not SOB walking on flat ground. Denies palpitations, abnormal bleeding, CP, dizziness, edema, or PND/Orthopnea. Appetite ok. Weight at home 230-235 pounds. Taking all medications. Drinks 3-4 vodka drinks/day, smoking 1/2 ppd, rare THC. Has dry mouth, drinks a lot of fluid.   Cardiac Studies - Echo 7/24: EF 50-55%  - cMRI 2022: LVEF 70%, RV nl. +LGE noted in the mid-wall  basal anteroseptum and mid-wall basal inferolateral wall, noncoronary disease pattern   Past Medical History:  Diagnosis Date   Acute respiratory failure with hypoxia (HCC) 12/23/2022   Alcohol withdrawal syndrome, with delirium (HCC) 12/27/2022   COPD (chronic obstructive pulmonary disease) (HCC)    Diabetes mellitus without complication (HCC)    Diverticulitis    DVT (deep venous thrombosis) (HCC)    x3   Encephalopathy acute 12/28/2022   ETOH abuse    H/O colectomy    Hyperlipidemia    Hypertension    Sepsis associated hypotension (HCC) 08/03/2019   Smoker    Current Outpatient Medications  Medication Sig Dispense Refill   Accu-Chek Softclix Lancets lancets Use to test blood sugar once daily 100 each 11   acetaminophen  (ACETAMINOPHEN  8 HOUR) 650 MG CR tablet Take 1 tablet (650 mg total) by mouth every 8 (eight) hours as needed for pain 90 tablet 1   ascorbic acid  (VITAMIN C ) 500 MG tablet Take 1 tablet (500 mg total) by mouth daily. 30 tablet 1   atorvastatin  (LIPITOR) 10 MG tablet Take 1 tablet (10 mg total) by mouth daily. 90 tablet 1   cetirizine  (ZYRTEC ) 10 MG tablet Take 1 tablet (10 mg total) by mouth daily. 90 tablet 1   dapagliflozin  propanediol (FARXIGA ) 10 MG TABS tablet Take 1 tablet (10 mg total) by mouth daily before breakfast. 90 tablet 2   fluticasone  (FLOVENT  HFA) 110 MCG/ACT inhaler Inhale 1 puff into the lungs in the morning and at bedtime. 12 g 2   folic acid  (FOLVITE ) 1 MG tablet Take 1 tablet (1 mg total) by mouth daily. 90 tablet 0  gabapentin  (NEURONTIN ) 300 MG capsule Take 2 capsules (600 mg total) by mouth 2 (two) times daily. 90 capsule 3   glucose blood (ACCU-CHEK GUIDE TEST) test strip Use to test blood sugar once daily 100 each 11   guaiFENesin  (MUCINEX ) 600 MG 12 hr tablet Take 1 tablet (600 mg total) by mouth 2 (two) times daily as needed to loosen phlegm or cough. 180 tablet 1   hydrOXYzine  (VISTARIL ) 50 MG capsule Take 1 capsule (50 mg total) by  mouth at bedtime as needed. 30 capsule 2   ipratropium-albuterol  (DUONEB) 0.5-2.5 (3) MG/3ML SOLN Take 3 mLs by nebulization every 6 (six) hours as needed (wheezing / sob).     losartan  (COZAAR ) 25 MG tablet Take 1 tablet (25 mg total) by mouth daily. 30 tablet 5   metFORMIN  (GLUCOPHAGE ) 500 MG tablet Take 2 tablets (1,000 mg total) by mouth daily with breakfast AND 1 tablet (500 mg total) daily with supper. 90 tablet 3   metoprolol  succinate (TOPROL -XL) 25 MG 24 hr tablet Take 1 tablet (25 mg total) by mouth daily. 90 tablet 3   Tiotropium Bromide -Olodaterol 2.5-2.5 MCG/ACT AERS Inhale 2 puffs into the lungs daily. 4 g 3   torsemide  (DEMADEX ) 20 MG tablet Take 4 tablets (80 mg total) by mouth daily. 120 tablet 3   triamcinolone  cream (KENALOG ) 0.5 % Apply topically to legs twice daily for up to 2 weeks as needed for eczema 30 g 1   No current facility-administered medications for this visit.   Allergies  Allergen Reactions   Codeine Hives   Lisinopril Other (See Comments)    Other reaction(s): renal effects   Social History   Socioeconomic History   Marital status: Single    Spouse name: Not on file   Number of children: 1   Years of education: Not on file   Highest education level: 12th grade  Occupational History   Occupation: Pt states not able to work  Tobacco Use   Smoking status: Every Day    Current packs/day: 0.75    Average packs/day: 0.8 packs/day for 48.6 years (36.4 ttl pk-yrs)    Types: Cigarettes    Start date: 4   Smokeless tobacco: Current   Tobacco comments:    Less than .5pk daily 10/03/23  Vaping Use   Vaping status: Never Used  Substance and Sexual Activity   Alcohol use: Yes    Alcohol/week: 4.0 standard drinks of alcohol    Types: 4 Shots of liquor per week    Comment: vodka   Drug use: Yes    Types: Marijuana    Comment: pt states he smokes hemp   Sexual activity: Not Currently  Other Topics Concern   Not on file  Social History Narrative    Watches TV, has a son who travels a lot, stated if he needed anything his son would help out   Social Drivers of Corporate investment banker Strain: Not on file  Food Insecurity: No Food Insecurity (12/21/2023)   Hunger Vital Sign    Worried About Running Out of Food in the Last Year: Never true    Ran Out of Food in the Last Year: Never true  Transportation Needs: No Transportation Needs (12/21/2023)   PRAPARE - Administrator, Civil Service (Medical): No    Lack of Transportation (Non-Medical): No  Recent Concern: Transportation Needs - Unmet Transportation Needs (10/12/2023)   PRAPARE - Administrator, Civil Service (Medical): Yes  Lack of Transportation (Non-Medical): Yes  Physical Activity: Not on file  Stress: Stress Concern Present (04/18/2023)   [employer] of Occupational Health - Occupational Stress Questionnaire    Feeling of Stress : To some extent  Social Connections: Not on file  Intimate Partner Violence: Patient Declined (12/21/2023)   Humiliation, Afraid, Rape, and Kick questionnaire    Fear of Current or Ex-Partner: Patient declined    Emotionally Abused: Patient declined    Physically Abused: Patient declined    Sexually Abused: Patient declined   Family History  Problem Relation Age of Onset   COPD Mother    Prostate cancer Father    Colon cancer Neg Hx    Rectal cancer Neg Hx    Stomach cancer Neg Hx    Esophageal cancer Neg Hx    Wt Readings from Last 3 Encounters:  01/01/24 104.8 kg (231 lb)  12/28/23 104.2 kg (229 lb 12.8 oz)  12/25/23 101.6 kg (224 lb)   There were no vitals taken for this visit.  PHYSICAL EXAM: General:  NAD. No resp difficulty, walked into clinic HEENT: Normal Neck: Supple. No JVD. Cor: Regular rate & rhythm. No rubs, gallops or murmurs. Lungs: Clear Abdomen: + distended, + colostomy and dressing Extremities: No cyanosis, clubbing, rash, edema Neuro: Alert & oriented x 3, moves all 4  extremities w/o difficulty. Affect pleasant.  ReDs reading: 36%, abnormal  ASSESSMENT & PLAN: Chronic Diastolic Heart Failure - cMRI in 2022 EF 70%, RV nl. +LGE noted in the mid-wall basal anteroseptum and mid-wall basal inferolateral wall, noncoronary disease pattern  - Echo 7/24 EF 50-55% RV nl  - NYHA II, volume ok, REDs 36% - Continue torsemide  40 mg daily. OK to take extra 20 mg PRN - Continue Farxiga  10 mg daily. - Continue Toprol  XL 25 mg daily.  - Continue losartan  25 mg daily.  - Off spiro with hyperkalemia. 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 No mediastinal lymphadenopathy on CTs to suggest pulmonary sarcoid. However, due to his continued excessive alcohol intake and multiple wounds (large abdominal wound) he would be high risk for immunosuppressive therapy with concerns regarding compliance.  - Repeat echo next visit. - Labs from 10/20/23 reviewed K 4.0, SCr 1.54  2. CHB s/p Leadless PPM - Following with EP  - No change.   3. T2DM - Most recent A1C 7.3  - Followed by PCP   4. HTN - BP up a bit, rushed to get to appt today - Consider increasing losartan  next visit with renal function stable   5. Abdominal wound / hernia w/ hx of ileostomy - Followed at [facility]  6. ETOH/tobacco abuse - Drinks 3-4 vodka drinks/day - Smoking 1/2 ppd - Discussed cessation   Follow up in 3 months with Dr. Gardenia + echo  Harlene HERO [place], [place] 01/02/24

## 2024-01-03 ENCOUNTER — Other Ambulatory Visit (HOSPITAL_COMMUNITY): Payer: Self-pay

## 2024-01-03 ENCOUNTER — Other Ambulatory Visit: Payer: Self-pay

## 2024-01-03 ENCOUNTER — Encounter: Payer: Self-pay | Admitting: Podiatry

## 2024-01-03 ENCOUNTER — Ambulatory Visit (INDEPENDENT_AMBULATORY_CARE_PROVIDER_SITE_OTHER): Admitting: Podiatry

## 2024-01-03 ENCOUNTER — Telehealth: Payer: Self-pay

## 2024-01-03 DIAGNOSIS — M79675 Pain in left toe(s): Secondary | ICD-10-CM

## 2024-01-03 DIAGNOSIS — T148XXA Other injury of unspecified body region, initial encounter: Secondary | ICD-10-CM

## 2024-01-03 DIAGNOSIS — M1A9XX0 Chronic gout, unspecified, without tophus (tophi): Secondary | ICD-10-CM

## 2024-01-03 DIAGNOSIS — B351 Tinea unguium: Secondary | ICD-10-CM

## 2024-01-03 DIAGNOSIS — E1142 Type 2 diabetes mellitus with diabetic polyneuropathy: Secondary | ICD-10-CM

## 2024-01-03 DIAGNOSIS — M79674 Pain in right toe(s): Secondary | ICD-10-CM

## 2024-01-03 DIAGNOSIS — Z7689 Persons encountering health services in other specified circumstances: Secondary | ICD-10-CM | POA: Diagnosis not present

## 2024-01-03 LAB — VITAMIN B1: Vitamin B1 (Thiamine): 134 nmol/L — ABNORMAL HIGH (ref 8–30)

## 2024-01-03 MED ORDER — GABAPENTIN 300 MG PO CAPS
600.0000 mg | ORAL_CAPSULE | Freq: Two times a day (BID) | ORAL | 0 refills | Status: DC
  Filled 2024-01-03 – 2024-01-23 (×3): qty 360, 90d supply, fill #0

## 2024-01-03 MED ORDER — METHYLPREDNISOLONE 4 MG PO TBPK
ORAL_TABLET | ORAL | 0 refills | Status: DC
  Filled 2024-01-03: qty 21, 6d supply, fill #0

## 2024-01-03 NOTE — Progress Notes (Signed)
  Subjective:  Patient ID: Luis [name], male    DOB: 06-19-1960,   MRN: [id number]  Chief Complaint  Patient presents with   Diabetes     had pneumonia at the beginning of the month and got gout in my Left foot while I was in the Hospital Mainly because it is time to have his toe nails trimmed. Seeing Dr Lauraine Pereyra last Thursday A1c was 7.1?    64 y.o. male presents for concern of thickened elongated and painful nails that are difficult to trim. Requesting to have them trimmed today. Relates burning and tingling in their feet. Patient is diabetic and last A1c was  Lab Results  Component Value Date   HGBA1C 7.3 (H) 08/06/2023   .  Relates neuropathy symptoms doing well. Relates has had a couple gout flares recently and hoping to get some medication on hand if he has another flare  PCP:  Pereyra [name], NP    . Denies any other pedal complaints. Denies n/v/f/c.   Past Medical History:  Diagnosis Date   Acute respiratory failure with hypoxia (HCC) 12/23/2022   Alcohol withdrawal syndrome, with delirium (HCC) 12/27/2022   COPD (chronic obstructive pulmonary disease) (HCC)    Diabetes mellitus without complication (HCC)    Diverticulitis    DVT (deep venous thrombosis) (HCC)    x3   Encephalopathy acute 12/28/2022   ETOH abuse    H/O colectomy    Hyperlipidemia    Hypertension    Sepsis associated hypotension (HCC) 08/03/2019   Smoker     Objective:  Physical Exam: Vascular: DP/PT pulses 2/4 bilateral. CFT <3 seconds. Absent hair growth on digits. Edema noted to bilateral lower extremities. Xerosis noted bilaterally.  Skin. No lacerations or abrasions bilateral feet. Nails 1-5 bilateral  are thickened discolored and elongated with subungual debris.  Musculoskeletal: MMT 5/5 bilateral lower extremities in DF, PF, Inversion and Eversion. Deceased ROM in DF of ankle joint. No tenderness or pain around medial eminence noted currently.  Neurological: Sensation intact to light  touch. Protective sensation diminished bilateral.    Assessment:   1. Pain due to onychomycosis of toenails of both feet   2. Type 2 diabetes mellitus with diabetic polyneuropathy, without long-term current use of insulin  (HCC)   3. Chronic gout involving toe of left foot without tophus, unspecified cause      Plan:  Patient was evaluated and treated and all questions answered. -Discussed and educated patient on diabetic foot care, especially with  regards to the vascular, neurological and musculoskeletal systems.  -Stressed the importance of good glycemic control and the detriment of not  controlling glucose levels in relation to the foot. -Discussed supportive shoes at all times and checking feet regularly.  -Mechanically debrided all nails 1-5 bilateral using sterile nail nipper and filed with dremel without incident   -Continue gabapentin .   Refilled.  -Answered all patient questions -Patient to return  in 3 months for at risk foot care -Discussed treatement options for gouty arthritis and gout education provided. -Discussed diet modification.  -Rx Medrol  dose pack provided to be taken as needed for flares.  -Patient advised to call the office if any problems or questions arise in the meantime.   Asberry Failing, DPM

## 2024-01-03 NOTE — Telephone Encounter (Signed)
 Copied from CRM [id number]. Topic: Referral - Request for Referral >> Jan 03, 2024  3:54 PM Lavanda D wrote: Did the patient discuss referral with their provider in the last year? Yes (If No - schedule appointment) (If Yes - send message)  Appointment offered? Yes  Type of order/referral and detailed reason for visit: Patient said he previously had a referral to pain management and is in a lot of pain and looking for a new referral. He said the pain is everywhere. Foot pain/ostomy that hurts. Back pain as well.   Preference of office, provider, location: Patient said he knows of a clinic [address] near the hospital.   If referral order, have you been seen by this specialty before? Yes (If Yes, this issue or another issue? When? Where?  Can we respond through MyChart? No >> Jan 03, 2024  4:00 PM Abigail D wrote: Offered NT at the time of call for the immense pain he mentioned, he declined.

## 2024-01-04 ENCOUNTER — Other Ambulatory Visit: Payer: Self-pay | Admitting: *Deleted

## 2024-01-04 ENCOUNTER — Encounter (HOSPITAL_COMMUNITY)

## 2024-01-04 ENCOUNTER — Ambulatory Visit: Payer: Self-pay | Admitting: Cardiology

## 2024-01-04 LAB — CUP PACEART REMOTE DEVICE CHECK
Battery Remaining Longevity: 96 mo
Battery Voltage: 3.02 V
Brady Statistic AS VP Percent: 0 %
Brady Statistic AS VS Percent: 0 %
Brady Statistic RV Percent Paced: 99.79 %
Date Time Interrogation Session: 20250723194756
Implantable Pulse Generator Implant Date: 20220721
Lead Channel Impedance Value: 610 Ohm
Lead Channel Pacing Threshold Amplitude: 0.25 V
Lead Channel Pacing Threshold Pulse Width: 0.24 ms
Lead Channel Sensing Intrinsic Amplitude: 18.788 mV
Lead Channel Setting Pacing Amplitude: 0.875
Lead Channel Setting Pacing Pulse Width: 0.24 ms
Lead Channel Setting Sensing Sensitivity: 2 mV

## 2024-01-04 NOTE — Telephone Encounter (Signed)
 Called pt and asked what he needed in regards to his amlodipine , pt stated they told him to stop taking it and took him off it. I advise pt if the provide he saw did these changes then he should follow it. Pt verbalized understanding.

## 2024-01-04 NOTE — Addendum Note (Signed)
 Addended by: LEAR, Issiah Huffaker P on: 01/04/2024 11:52 AM   Modules accepted: Orders

## 2024-01-05 ENCOUNTER — Ambulatory Visit (HOSPITAL_COMMUNITY): Payer: Self-pay | Admitting: Family Medicine

## 2024-01-05 ENCOUNTER — Encounter (HOSPITAL_COMMUNITY): Payer: Self-pay

## 2024-01-05 ENCOUNTER — Ambulatory Visit (HOSPITAL_COMMUNITY)
Admission: RE | Admit: 2024-01-05 | Discharge: 2024-01-05 | Disposition: A | Discharge status: Home / Self Care | Source: Ambulatory Visit | Attending: *Deleted | Admitting: *Deleted

## 2024-01-05 ENCOUNTER — Ambulatory Visit (HOSPITAL_BASED_OUTPATIENT_CLINIC_OR_DEPARTMENT_OTHER)
Admission: RE | Admit: 2024-01-05 | Discharge: 2024-01-05 | Disposition: A | Discharge status: Home / Self Care | Source: Ambulatory Visit | Attending: *Deleted | Admitting: *Deleted

## 2024-01-05 VITALS — BP 142/80 | HR 62 | Ht 73.0 in | Wt 232.2 lb

## 2024-01-05 DIAGNOSIS — E119 Type 2 diabetes mellitus without complications: Secondary | ICD-10-CM | POA: Diagnosis not present

## 2024-01-05 DIAGNOSIS — F1010 Alcohol abuse, uncomplicated: Secondary | ICD-10-CM

## 2024-01-05 DIAGNOSIS — F17210 Nicotine dependence, cigarettes, uncomplicated: Secondary | ICD-10-CM | POA: Diagnosis not present

## 2024-01-05 DIAGNOSIS — E1142 Type 2 diabetes mellitus with diabetic polyneuropathy: Secondary | ICD-10-CM | POA: Diagnosis not present

## 2024-01-05 DIAGNOSIS — L7682 Other postprocedural complications of skin and subcutaneous tissue: Secondary | ICD-10-CM

## 2024-01-05 DIAGNOSIS — I4892 Unspecified atrial flutter: Secondary | ICD-10-CM | POA: Diagnosis not present

## 2024-01-05 DIAGNOSIS — K4020 Bilateral inguinal hernia, without obstruction or gangrene, not specified as recurrent: Secondary | ICD-10-CM

## 2024-01-05 DIAGNOSIS — Z79899 Other long term (current) drug therapy: Secondary | ICD-10-CM | POA: Insufficient documentation

## 2024-01-05 DIAGNOSIS — I10 Essential (primary) hypertension: Secondary | ICD-10-CM | POA: Diagnosis not present

## 2024-01-05 DIAGNOSIS — Z7984 Long term (current) use of oral hypoglycemic drugs: Secondary | ICD-10-CM | POA: Insufficient documentation

## 2024-01-05 DIAGNOSIS — I5032 Chronic diastolic (congestive) heart failure: Secondary | ICD-10-CM | POA: Diagnosis not present

## 2024-01-05 DIAGNOSIS — I110 Hypertensive heart disease with heart failure: Secondary | ICD-10-CM | POA: Diagnosis not present

## 2024-01-05 DIAGNOSIS — Z950 Presence of cardiac pacemaker: Secondary | ICD-10-CM | POA: Insufficient documentation

## 2024-01-05 DIAGNOSIS — I442 Atrioventricular block, complete: Secondary | ICD-10-CM | POA: Diagnosis not present

## 2024-01-05 DIAGNOSIS — E875 Hyperkalemia: Secondary | ICD-10-CM | POA: Diagnosis not present

## 2024-01-05 DIAGNOSIS — Z720 Tobacco use: Secondary | ICD-10-CM

## 2024-01-05 DIAGNOSIS — Z432 Encounter for attention to ileostomy: Secondary | ICD-10-CM

## 2024-01-05 DIAGNOSIS — R0683 Snoring: Secondary | ICD-10-CM | POA: Insufficient documentation

## 2024-01-05 DIAGNOSIS — Z932 Ileostomy status: Secondary | ICD-10-CM | POA: Diagnosis present

## 2024-01-05 DIAGNOSIS — Z7689 Persons encountering health services in other specified circumstances: Secondary | ICD-10-CM | POA: Diagnosis not present

## 2024-01-05 LAB — BASIC METABOLIC PANEL WITH GFR
Anion gap: 15 (ref 5–15)
BUN: 40 mg/dL — ABNORMAL HIGH (ref 8–23)
CO2: 28 mmol/L (ref 22–32)
Calcium: 9.7 mg/dL (ref 8.9–10.3)
Chloride: 95 mmol/L — ABNORMAL LOW (ref 98–111)
Creatinine, Ser: 1.61 mg/dL — ABNORMAL HIGH (ref 0.61–1.24)
GFR, Estimated: 47 mL/min — ABNORMAL LOW (ref >60)
Glucose, Bld: 158 mg/dL — ABNORMAL HIGH (ref 70–99)
Potassium: 4.8 mmol/L (ref 3.5–5.1)
Sodium: 138 mmol/L (ref 135–145)

## 2024-01-05 LAB — BRAIN NATRIURETIC PEPTIDE: B Natriuretic Peptide: 220.4 pg/mL — ABNORMAL HIGH (ref 0.0–100.0)

## 2024-01-05 NOTE — Patient Outreach (Deleted)
 Complex Care Management   Visit Note  01/08/2024  Name:  Luis [name] MRN: [id number] DOB: 05-11-1961  Situation: .Referral received for Complex Care Management related to SDOH Barriers:  personal care services needs I obtained verbal consent from Patient.  Visit completed with patient  on the phone on 01/04/24  Background:   Past Medical History:  Diagnosis Date   Acute respiratory failure with hypoxia (HCC) 12/23/2022   Alcohol withdrawal syndrome, with delirium (HCC) 12/27/2022   COPD (chronic obstructive pulmonary disease) (HCC)    Diabetes mellitus without complication (HCC)    Diverticulitis    DVT (deep venous thrombosis) (HCC)    x3   Encephalopathy acute 12/28/2022   ETOH abuse    H/O colectomy    Hyperlipidemia    Hypertension    Sepsis associated hypotension (HCC) 08/03/2019   Smoker     Assessment: Patient Reported Symptoms:  Cognitive Cognitive Status: Alert and oriented to person, place, and time, Normal speech and language skills Cognitive/Intellectual Conditions Management [RPT]: None reported or documented in medical history or problem list   Health Maintenance Behaviors: Annual physical exam, Healthy diet Healing Pattern: Unsure Health Facilitated by: Pain control, Healthy diet  Neurological Neurological Review of Symptoms: No symptoms reported    HEENT HEENT Symptoms Reported: No symptoms reported      Cardiovascular Cardiovascular Symptoms Reported: No symptoms reported    Respiratory Respiratory Symptoms Reported: No symptoms reported    Endocrine Endocrine Symptoms Reported: No symptoms reported Is patient diabetic?: Yes Is patient checking blood sugars at home?: No List most recent blood sugar readings, include date and time of day: does not cehck states that is continues metformin     Gastrointestinal Gastrointestinal Symptoms Reported: Abdominal pain or discomfort (Patient reports genralized pain in feet, back and ostomy site-states that he is  being referred to pain mgmt  to manage pain)      Genitourinary Genitourinary Symptoms Reported: No symptoms reported    Integumentary Integumentary Symptoms Reported: Wound Additional Integumentary Details: wound care and ostomy clinic tomorrow     heart failure clinic tomorrow    Musculoskeletal Musculoskelatal Symptoms Reviewed: No symptoms reported        Psychosocial Psychosocial Symptoms Reported: No symptoms reported Additional Psychological Details: patient reports continued need for personal care services to assists with ADL's   Behaviors When Feeling Stressed/Fearful: music, cooking , gardening Techniques to [employer] with Loss/Stress/Change: Diversional activities Quality of Family Relationships: supportive Do you feel physically threatened by others?: No      10/25/2023    1:46 PM  Depression screen PHQ 2/9  Decreased Interest 1  Down, Depressed, Hopeless 1  PHQ - 2 Score 2  Altered sleeping 1  Tired, decreased energy 1  Change in appetite 0  Feeling bad or failure about yourself  1  Trouble concentrating 1  Moving slowly or fidgety/restless 0  Suicidal thoughts 0  PHQ-9 Score 6  Difficult doing work/chores Somewhat difficult    There were no vitals filed for this visit.  Medications Reviewed Today   Medications were not reviewed in this encounter     Recommendation:   PCP Follow-up Specialty provider follow-up as scheduled Personal care services request form re-sbubmitted  Follow Up Plan:   Telephone follow-up 01/30/24   Lenn Mean, LCSW [place]  [facility], [facility] Licensed Clinical Social Worker  Direct Dial: [phone]

## 2024-01-05 NOTE — Addendum Note (Signed)
 Encounter addended by: Marcelina Lisa HERO, RN on: 01/05/2024 1:54 PM  Actions taken: Clinical Note Signed

## 2024-01-05 NOTE — Patient Instructions (Addendum)
 There has been no changes to your medications.  Labs done today, your results will be available in MyChart, we will contact you for abnormal readings.  Your physician has requested that you have an echocardiogram. Echocardiography is a painless test that uses sound waves to create images of your heart. It provides your doctor with information about the size and shape of your heart and how well your heart's chambers and valves are working. This procedure takes approximately one hour. There are no restrictions for this procedure. Please do NOT wear cologne, perfume, aftershave, or lotions (deodorant is allowed). Please arrive 15 minutes prior to your appointment time.  Please note: We ask at that you not bring children with you during ultrasound (echo/ vascular) testing. Due to room size and safety concerns, children are not allowed in the ultrasound rooms during exams. Our front office staff cannot provide observation of children in our lobby area while testing is being conducted. An adult accompanying a patient to their appointment will only be allowed in the ultrasound room at the discretion of the ultrasound technician under special circumstances. We apologize for any inconvenience.  YOUR PROVIDER HAS ORDERED A SLEEP STUDY. YOU WILL BE CALLED TO HAVE THIS TEST ARRANGED.  Your physician recommends that you schedule a follow-up appointment in: 3 months ( October) ** PLEASE CALL THE OFFICE IN MID AUGUST TO ARRANGE YOUR FOLLOW UP APPOINTMENT.**  If you have any questions or concerns before your next appointment please send us  a message through [place] or call our office at [phone].    TO LEAVE A MESSAGE FOR THE NURSE SELECT OPTION 2, PLEASE LEAVE A MESSAGE INCLUDING: YOUR NAME DATE OF BIRTH CALL BACK NUMBER REASON FOR CALL**this is important as we prioritize the call backs  YOU WILL RECEIVE A CALL BACK THE SAME DAY AS LONG AS YOU CALL BEFORE 4:00 PM  At the [facility],  you and your health needs are our priority. As part of our continuing mission to provide you with exceptional heart care, we have created designated Provider Care Teams. These Care Teams include your primary Cardiologist (physician) and Advanced Practice Providers (APPs- Physician Assistants and Nurse Practitioners) who all work together to provide you with the care you need, when you need it.   You may see any of the following providers on your designated Care Team at your next follow up: Dr Toribio Fuel Dr Ezra Shuck Dr. Ria Commander Dr. Morene Brownie Amy Lenetta, NP Caffie Shed, [place] [facility], [place] Beckey Coe, NP [place] Lee, NP Ellouise Class, NP Tinnie Redman, PharmD Jaun Bash, PharmD   Please be sure to bring in all your medications bottles to every appointment.    Thank you for choosing [facility]

## 2024-01-05 NOTE — Progress Notes (Signed)
   ReDS Vest / Clip - 01/05/24 1317       ReDS Vest / Clip   Station Marker D    Ruler Value 36    ReDS Value Range Low volume    ReDS Actual Value 31

## 2024-01-05 NOTE — Progress Notes (Signed)
 [facility]   Reason for visit:  Ileostomy care Wound care Recent hospitalization HPI:  Perforated diverticulitis with ileostomy creation Past Medical History:  Diagnosis Date   Acute respiratory failure with hypoxia (HCC) 12/23/2022   Alcohol withdrawal syndrome, with delirium (HCC) 12/27/2022   COPD (chronic obstructive pulmonary disease) (HCC)    Diabetes mellitus without complication (HCC)    Diverticulitis    DVT (deep venous thrombosis) (HCC)    x3   Encephalopathy acute 12/28/2022   ETOH abuse    H/O colectomy    Hyperlipidemia    Hypertension    Sepsis associated hypotension (HCC) 08/03/2019   Smoker    Family History  Problem Relation Age of Onset   COPD Mother    Prostate cancer Father    Colon cancer Neg Hx    Rectal cancer Neg Hx    Stomach cancer Neg Hx    Esophageal cancer Neg Hx    Allergies  Allergen Reactions   Codeine Hives   Lisinopril Other (See Comments)    Other reaction(s): renal effects   Current Outpatient Medications  Medication Sig Dispense Refill Last Dose/Taking   Accu-Chek Softclix Lancets lancets Use to test blood sugar once daily 100 each 11    acetaminophen  (ACETAMINOPHEN  8 HOUR) 650 MG CR tablet Take 1 tablet (650 mg total) by mouth every 8 (eight) hours as needed for pain 90 tablet 1    ascorbic acid  (VITAMIN C ) 500 MG tablet Take 1 tablet (500 mg total) by mouth daily. 30 tablet 1    atorvastatin  (LIPITOR) 10 MG tablet Take 1 tablet (10 mg total) by mouth daily. 90 tablet 1    cetirizine  (ZYRTEC ) 10 MG tablet Take 1 tablet (10 mg total) by mouth daily. 90 tablet 1    dapagliflozin  propanediol (FARXIGA ) 10 MG TABS tablet Take 1 tablet (10 mg total) by mouth daily before breakfast. 90 tablet 2    fluticasone  (FLOVENT  HFA) 110 MCG/ACT inhaler Inhale 1 puff into the lungs in the morning and at bedtime. 12 g 2    folic acid  (FOLVITE ) 1 MG tablet Take 1 tablet (1 mg total) by mouth daily. 90 tablet 0    gabapentin   (NEURONTIN ) 300 MG capsule Take 2 capsules (600 mg total) by mouth 2 (two) times daily. 360 capsule 0    glucose blood (ACCU-CHEK GUIDE TEST) test strip Use to test blood sugar once daily 100 each 11    guaiFENesin  (MUCINEX ) 600 MG 12 hr tablet Take 1 tablet (600 mg total) by mouth 2 (two) times daily as needed to loosen phlegm or cough. 180 tablet 1    hydrOXYzine  (VISTARIL ) 50 MG capsule Take 1 capsule (50 mg total) by mouth at bedtime as needed. 30 capsule 2    ipratropium-albuterol  (DUONEB) 0.5-2.5 (3) MG/3ML SOLN Take 3 mLs by nebulization every 6 (six) hours as needed (wheezing / sob).      losartan  (COZAAR ) 25 MG tablet Take 1 tablet (25 mg total) by mouth daily. 30 tablet 5    metFORMIN  (GLUCOPHAGE ) 500 MG tablet Take 2 tablets (1,000 mg total) by mouth daily with breakfast AND 1 tablet (500 mg total) daily with supper. 90 tablet 3    methylPREDNISolone  (MEDROL  DOSEPAK) 4 MG TBPK tablet Take as directed 21 tablet 0    metoprolol  succinate (TOPROL -XL) 25 MG 24 hr tablet Take 1 tablet (25 mg total) by mouth daily. 90 tablet 3    Tiotropium Bromide -Olodaterol 2.5-2.5 MCG/ACT AERS Inhale 2 puffs into  the lungs daily. 4 g 3    torsemide  (DEMADEX ) 20 MG tablet Take 4 tablets (80 mg total) by mouth daily. 120 tablet 3    triamcinolone  cream (KENALOG ) 0.5 % Apply topically to legs twice daily for up to 2 weeks as needed for eczema 30 g 1    No current facility-administered medications for this encounter.   ROS  Review of Systems  Respiratory:         Recent hospitalization for pneumonia and CHF   Cardiovascular:  Positive for leg swelling.       Recent hospitalization for CHF exacerbation  Gastrointestinal:        RLQ ileostomy Ventral hernia  Skin:  Positive for color change, rash and wound.       Midline abdomen, nonhealing chronic wounds  Neurological:  Positive for numbness.       Neuropathy in feet  Psychiatric/Behavioral: Negative.    All other systems reviewed and are  negative.  Vital signs:  BP (!) 152/80   Pulse (!) 103   Temp (!) 97.5 F (36.4 C)   Resp 18   SpO2 93%  Exam:  Physical Exam Vitals reviewed.  Constitutional:      Appearance: He is obese.  Cardiovascular:     Rate and Rhythm: Normal rate.     Pulses: Normal pulses.  Pulmonary:     Effort: Pulmonary effort is normal.     Breath sounds: Wheezing present.     Comments: Shortness of breath with exertion.  O2 saturation 93 % on arrival, 95% upon conclusion of visit Abdominal:     Hernia: A hernia is present.     Comments: Nonhealing wounds to abdomen RLQ ileostomy  Musculoskeletal:        General: Normal range of motion.  Skin:    General: Skin is warm and dry.     Findings: Lesion (abdominal wounds, nonhealing, chronic) present.  Psychiatric:        Mood and Affect: Mood normal.     Stoma type/location:  RLQ ileostomy Stomal assessment/size:  1 recessed Peristomal assessment:  erythema and tenderness Treatment options for stomal/peristomal skin: barrier ring and 1 piece pouch Output: soft brown stool  Ostomy pouching: 1pc. Education provided:  no changes to pouching Wound care to abdomen performed.  Continue VASHE and aquacell twice weekly.     Impression/dx  Ileostomy Hernia Nonhealing wound Discussion  See back as needed Plan  Call clinic as needed.     Visit time: 55 minutes.   Darice Cooley FNP-BC

## 2024-01-08 NOTE — Patient Outreach (Signed)
 Complex Care Management   Visit Note  01/08/2024  Name:  Luis [name] MRN: [id number] DOB: November 25, 1960  Situation: Referral received for Complex Care Management related to SDOH Barriers:  personal care services needs I obtained verbal consent from Patient.  Visit completed with patient  on the phone on  01/04/24 Background:   Past Medical History:  Diagnosis Date   Acute respiratory failure with hypoxia (HCC) 12/23/2022   Alcohol withdrawal syndrome, with delirium (HCC) 12/27/2022   COPD (chronic obstructive pulmonary disease) (HCC)    Diabetes mellitus without complication (HCC)    Diverticulitis    DVT (deep venous thrombosis) (HCC)    x3   Encephalopathy acute 12/28/2022   ETOH abuse    H/O colectomy    Hyperlipidemia    Hypertension    Sepsis associated hypotension (HCC) 08/03/2019   Smoker     Assessment: Patient Reported Symptoms:  Cognitive Cognitive Status: Alert and oriented to person, place, and time, Normal speech and language skills Cognitive/Intellectual Conditions Management [RPT]: None reported or documented in medical history or problem list   Health Maintenance Behaviors: Annual physical exam, Healthy diet Healing Pattern: Unsure Health Facilitated by: Pain control, Healthy diet  Neurological Neurological Review of Symptoms: No symptoms reported    HEENT HEENT Symptoms Reported: No symptoms reported      Cardiovascular Cardiovascular Symptoms Reported: No symptoms reported    Respiratory Respiratory Symptoms Reported: No symptoms reported    Endocrine Endocrine Symptoms Reported: No symptoms reported Is patient diabetic?: Yes Is patient checking blood sugars at home?: No List most recent blood sugar readings, include date and time of day: does not cehck states that is continues metformin     Gastrointestinal Gastrointestinal Symptoms Reported: Abdominal pain or discomfort (Patient reports genralized pain in feet, back and ostomy site-states that he is  being referred to pain mgmt  to manage pain)      Genitourinary Genitourinary Symptoms Reported: No symptoms reported    Integumentary Integumentary Symptoms Reported: Wound Additional Integumentary Details: wound care and ostomy clinic tomorrow     heart failure clinic tomorrow    Musculoskeletal Musculoskelatal Symptoms Reviewed: No symptoms reported        Psychosocial Psychosocial Symptoms Reported: No symptoms reported Additional Psychological Details: patient reports continued need for personal care services to assists with ADL's   Behaviors When Feeling Stressed/Fearful: music, cooking , gardening Techniques to [employer] with Loss/Stress/Change: Diversional activities Quality of Family Relationships: supportive Do you feel physically threatened by others?: No      10/25/2023    1:46 PM  Depression screen PHQ 2/9  Decreased Interest 1  Down, Depressed, Hopeless 1  PHQ - 2 Score 2  Altered sleeping 1  Tired, decreased energy 1  Change in appetite 0  Feeling bad or failure about yourself  1  Trouble concentrating 1  Moving slowly or fidgety/restless 0  Suicidal thoughts 0  PHQ-9 Score 6  Difficult doing work/chores Somewhat difficult    There were no vitals filed for this visit.  Medications Reviewed Today   Medications were not reviewed in this encounter     Recommendation:   PCP Follow-up Specialty provider follow-up as scheduled Personal Care Services through [facility] re-submitted  Follow Up Plan:   Telephone follow-up 01/30/24    Lenn Mean, LCSW [place]  [facility], [facility] Licensed Clinical Social Worker  Direct Dial: [phone]

## 2024-01-08 NOTE — Patient Instructions (Signed)
 Visit Information  Thank you for taking time to visit with me today. Please don't hesitate to contact me if I can be of assistance to you before our next scheduled appointment.  Your next care management appointment is by telephone on 01/30/24 at 10am  Telephone follow-up 01/30/24  Please call the care guide team at [phone] if you need to cancel, schedule, or reschedule an appointment.   Please call 911 if you are experiencing a Mental Health or Behavioral Health Crisis or need someone to talk to.  Juda Toepfer, LCSW [place]  [facility], [facility] Licensed Clinical Social Worker  Direct Dial: [phone]

## 2024-01-09 ENCOUNTER — Ambulatory Visit (HOSPITAL_COMMUNITY): Admitting: Nurse Practitioner

## 2024-01-09 ENCOUNTER — Encounter (HOSPITAL_COMMUNITY)

## 2024-01-11 ENCOUNTER — Encounter (HOSPITAL_COMMUNITY)

## 2024-01-11 ENCOUNTER — Other Ambulatory Visit: Payer: Self-pay

## 2024-01-11 NOTE — Patient Instructions (Signed)
 Visit Information  [name] was given information about Medicaid Managed Care team care coordination services as a part of their [facility] Medicaid benefit.   If you would like to schedule transportation through your [facility] plan, please call the following number at least 2 days in advance of your appointment: [phone].   You can also use the MTM portal or MTM mobile app to manage your rides. Reimbursement for transportation is available through [facility]! For the portal, please go to [email or web address]  Call the [facility] Crisis Line at [phone], at any time, 24 hours a day, 7 days a week. If you are in danger or need immediate medical attention call 911.   [name] - following are the goals we discussed in your visit today:   Goals Addressed             This Visit's Progress    VBCI RN Care Plan-COPD       Problems:  Chronic Disease Management support and education needs related to COPD Lacks caregiver support. Non-adherence to prescribed medication regimen  Goal: Over the next 90 days the Patient will attend all scheduled medical appointments: including ostomy nurse; electrophysiology 01/25/24  as evidenced by patient report and review of chart        continue to work with RN Care Manager and/or Social Worker to address care management and care coordination needs related to COPD as evidenced by adherence to care management team scheduled appointments     demonstrate Improved adherence to prescribed treatment plan for COPD as evidenced by patient report and review of chart take all medications exactly as prescribed and will call provider for medication related questions as evidenced by patient report and/or review of chart.     Interventions: COPD Interventions: Assess medical status related to COPD.   Patient Self-Care Activities:  Attend all scheduled provider appointments Call provider office for new concerns or questions  Take medications as  prescribed   Work with the social worker to address care coordination needs and will continue to work with the clinical team to address health care and disease management related needs  Plan:  Telephone follow up appointment with care management team member scheduled for:  02/08/24 at 11:30 am     VBCI RN Care Plan-HF       Problems:  Chronic Disease Management support and education needs related to CHF  Goal: Over the next 90 days the Patient will attend all scheduled medical appointments: including ostomy nurse, electrophysiology 01/25/24; pulmonary lung screening completed on 11/20/23  as evidenced by patient report and review of chart        continue to work with RN Care Manager and/or Social Worker to address care management and care coordination needs related to CHF as evidenced by adherence to care management team scheduled appointments     demonstrate Improved adherence to prescribed treatment plan for CHF as evidenced by  patient report or review of chart  Interventions:   Heart Failure Interventions: Discussed the importance of keeping all appointments with provider Reviewed weights with patient advised patient to continue to weight and record weight. Notify provider, cardiology and/or Primary care provider if weights increased 2-3 pounds overnight or 5 pounds in a week. Reviewed upcoming appointments  Patient Self-Care Activities:  Attend all scheduled provider appointments Call provider office for new concerns or questions  Take medications as prescribed   Work with the social worker to address care coordination needs and will continue to work with the clinical  team to address health care and disease management related needs call office if I gain more than 2 pounds in one day or 5 pounds in one week and follow instructions per HF clinic for as needed torsemide . Do the following things EVERYDAY: Weigh yourself in the morning before breakfast. Write it down and keep it in a  log. Take your medicines as prescribed Eat low salt foods--Limit salt (sodium) to 2000 mg per day.  Stay as active as you can everyday Limit all fluids for the day to less than 2 liters  Plan:  Telephone follow up appointment with care management team member scheduled for:  02/08/24 at 11:30 am         Please see education materials related to heart failure provided by MyChart link.  Patient verbalizes understanding of instructions and care plan provided today and agrees to view in MyChart. Active MyChart status and patient understanding of how to access instructions and care plan via MyChart confirmed with patient.     Telephone follow up appointment with Managed Medicaid care management team member scheduled for: 02/08/24 at 11:30 am  Heddy Shutter, RN, MSN, BSN, CCM [place]  [facility], Population Health Case Manager Phone: [phone]   Heart Failure: Eating Plan Heart failure is a long-term condition where the heart can't pump enough blood through the body. When this happens, parts of the body don't get the blood and oxygen they need. Living with heart failure can be hard. But a healthy lifestyle and choosing the right foods may help to improve your symptoms. If you have heart failure, your eating plan will include limiting the amount of salt, also called sodium, and unhealthy fats you eat. What are tips for following this plan? Reading food labels Check food labels for the amount of sodium per serving. Choose foods that have less than 140 mg (milligrams) of sodium in each serving. Check food labels for the number of calories per serving. This is important if you need to limit your daily calorie intake to lose weight. Check food labels for the serving size. If you eat more than one serving, you'll be eating more sodium and calories than what's listed on the label. Look for foods with the words sodium-free, very low sodium, or low sodium on the  package. Foods labeled as reduced sodium, lightly salted, or no salt added may still have more sodium than what's recommended for you. Cooking Avoid adding salt when cooking. Before using any salt substitutes, talk with your health care provider or an expert in healthy eating called a dietitian. Season food with salt-free seasonings, spices, or herbs. Check the label of seasoning mixes to make sure they don't contain salt. Cook with heart-healthy oils, such as olive, canola, soybean, or sunflower oil. Do not fry foods. Cook foods using low-fat methods, like baking, boiling, grilling, and broiling. Limit unhealthy fats when cooking. To do this: Remove the skin from poultry, such as chicken. Remove all the fat you can see on meats. Skim the fat off from stews, soups, and gravies before serving them. Meal planning  Limit your intake of: Processed, canned, or prepackaged foods. Foods that are high in trans fat, such as fried foods. Sweets, desserts, sugary drinks, and other foods with added sugar. Full-fat dairy products, such as whole milk. Eat a balanced diet. This may include: 4-5 servings of fruit each day and 4-5 servings of vegetables each day. At each meal, try to fill one-half of your plate with fruits and  vegetables. Up to 6-8 servings of whole grains each day. Up to 2 servings of lean meat, poultry, or fish each day. One serving of meat is equal to 3 oz (85 g). This is about the same size as a deck of cards. 2 servings of low-fat dairy each day. Heart-healthy fats. Healthy fats called omega-3 fatty acids are a good choice. They're found in foods such as flaxseed and cold-water fish like sardines, salmon, and mackerel. Aim to eat 25-35 g (grams) of fiber a day. Foods that are high in fiber include apples, broccoli, carrots, beans, peas, and whole grains. Do not add salt or condiments that contain salt (such as soy sauce) to foods before eating. When eating at a restaurant, ask  that your food be prepared with less salt or no salt, if possible. Try to eat 2 or more plant-based or meat-free meals each week. Cook more meals at home and eat less at restaurants, buffets, and fast food places. General information Do not eat more than 2,300 mg of sodium a day. The amount of sodium that's recommended for you may be lower, depending on your condition. Stay at a healthy weight as told. Ask your provider what a healthy weight is for you. Check your weight every day. Work with your provider and dietitian to make a plan that will help you lose weight or stay at your current weight. Limit how much fluid you drink. Ask your provider or dietitian how much fluid you can have each day. Limit or avoid alcohol as told. Recommended foods Fruits All fresh, frozen, and canned fruits. Dried fruits, such as raisins, prunes, and cranberries. Vegetables All fresh vegetables. Vegetables that are frozen without sauce or added salt. Low-sodium or sodium-free canned vegetables. Grains Bread with less than 80 mg of sodium per slice. Whole-wheat pasta, quinoa, and brown rice. Oats and oatmeal. Barley. Millet. Grits and cream of wheat. Whole-grain and whole-wheat cold cereal. Meats and other protein foods Lean cuts of meat. Skinless chicken and [place]. Fish with high omega-3 fatty acids, such as salmon, sardines, and other cold-water fishes. Eggs. Dried beans, peas, and edamame. Unsalted nuts and nut butters. Dairy Low-fat or nonfat (skim) milk and dried milk. Rice milk, soy milk, and almond milk. Low-fat or nonfat yogurt. Small amounts of reduced-sodium block cheese. Low-sodium cottage cheese. Fats and oils Olive, canola, soybean, flaxseed, avocado, or sunflower oil. Sweets and desserts Applesauce. Granola bars. Sugar-free pudding and gelatin. Frozen fruit bars. Seasoning and other foods Fresh and dried herbs. Lemon or lime juice. Vinegar. Low-sodium ketchup. Salt-free marinades, salad dressings,  sauces, and seasonings. The items listed above may not be all the foods and drinks you can have. Talk with a dietitian to learn more. Foods to avoid Fruits Fruits that are dried with preservatives that contain sodium. Vegetables Canned vegetables. Frozen vegetables with sauce or seasonings. Creamed vegetables. [place] fries. Onion rings. Pickled vegetables and sauerkraut. Grains Bread with more than 80 mg of sodium per slice. Hot or cold cereal with more than 140 mg sodium per serving. Salted pretzels and crackers. Prepackaged breadcrumbs. Bagels, croissants, and biscuits. Meats and other protein foods Ribs and chicken wings. Bacon, ham, pepperoni, bologna, salami, and packaged luncheon meats. Hot dogs, bratwurst, and sausage. Canned meat. Smoked meat and fish. Salted nuts and seeds. Dairy Whole milk, half-and-half, and cream. Buttermilk. Processed cheese, cheese spreads, and cheese curds. Regular cottage cheese. Feta cheese. Shredded cheese. String cheese. Fats and oils Butter, lard, shortening, ghee, and bacon fat. Canned and  packaged gravies. Seasoning and other foods Onion salt, garlic salt, table salt, and sea salt. Marinades. Regular salad dressings. Relishes, pickles, and olives. Meat flavorings and tenderizers, and bouillon cubes. Horseradish, ketchup, and mustard. Worcestershire sauce. Teriyaki sauce, soy sauce (including reduced sodium). Hot sauce and Tabasco sauce. Steak sauce, fish sauce, oyster sauce, and cocktail sauce. Taco seasonings. Barbecue sauce. Tartar sauce. The items listed above may not be all the foods and drinks you should avoid. Talk with a dietitian to learn more. This information is not intended to replace advice given to you by your health care provider. Make sure you discuss any questions you have with your health care provider. Document Revised: 01/23/2023 Document Reviewed: 01/12/2023 Elsevier Patient Education  2024 [employer].

## 2024-01-11 NOTE — Patient Outreach (Signed)
 Complex Care Management   Visit Note  01/11/2024  Name:  Luis [name] MRN: [id number] DOB: 1961-06-05  Situation: Referral received for Complex Care Management related to Heart Failure and COPD I obtained verbal consent from Patient.  Visit completed with patient  on the phone  Background:   Past Medical History:  Diagnosis Date   Acute respiratory failure with hypoxia (HCC) 12/23/2022   Alcohol withdrawal syndrome, with delirium (HCC) 12/27/2022   COPD (chronic obstructive pulmonary disease) (HCC)    Diabetes mellitus without complication (HCC)    Diverticulitis    DVT (deep venous thrombosis) (HCC)    x3   Encephalopathy acute 12/28/2022   ETOH abuse    H/O colectomy    Hyperlipidemia    Hypertension    Sepsis associated hypotension (HCC) 08/03/2019   Smoker     Assessment:  Patient reports, I'm doing alright. Patient refuses to continues telephone assessment, unable to complete medication review. He reports upcoming appointments with ostomy nurse and cardiologist. However, Patient is agreeable to a follow up call and request it be next month.   Patient Reported Symptoms: Cognitive Cognitive Status: Alert and oriented to person, place, and time, Insightful and able to interpret abstract concepts, Normal speech and language skills      Neurological Neurological Review of Symptoms: No symptoms reported    HEENT HEENT Symptoms Reported: No symptoms reported      Cardiovascular Cardiovascular Symptoms Reported: No symptoms reported Other Cardiovascular Symptoms: Patient reports that weight today 236lbs with clothes on. Patient states which was probably about 232lbs with clothes off.Patient reports that weight yesterday was 232lbs. Denies any cardiovasular symptoms or concerns at this time. Patient reports that he Im doing alright''.  Patient reports an uncoming appointment with Cardiology. Per review of chart patient has appointment scheduled on 01-25-24 with Cardiac EP  Provider, Dr. Ole Holts.    Respiratory Respiratory Symptoms Reported: No symptoms reported    Endocrine Endocrine Symptoms Reported:  (Not assessed.  Patient refused to continue assessment.)    Gastrointestinal Gastrointestinal Symptoms Reported: Not assessed (Patient refused to continue assessment.)      Genitourinary Genitourinary Symptoms Reported: Not assessed (Patient refuses to continue assessment.)    Integumentary Integumentary Symptoms Reported: Wound Additional Integumentary Details: Patient states he has an appointment with Ostomy clinic on 01-12-24.    Musculoskeletal Musculoskelatal Symptoms Reviewed: Not assessed (Patient refused to continue assessment.)        Psychosocial Psychosocial Symptoms Reported:  (Currently engaged with LCSW.  Per review of chart next appointment 01-30-24.)            10/25/2023    1:46 PM  Depression screen PHQ 2/9  Decreased Interest 1  Down, Depressed, Hopeless 1  PHQ - 2 Score 2  Altered sleeping 1  Tired, decreased energy 1  Change in appetite 0  Feeling bad or failure about yourself  1  Trouble concentrating 1  Moving slowly or fidgety/restless 0  Suicidal thoughts 0  PHQ-9 Score 6  Difficult doing work/chores Somewhat difficult    There were no vitals filed for this visit.  Medications Reviewed Today   Medications were not reviewed in this encounter   Recommendation:   Continue Current Plan of Care  Follow Up Plan:   Telephone follow up appointment date/time:  02/08/24 at 1130 am  Heddy Shutter, RN, MSN, BSN, CCM [place]  [facility], Population Health Case Manager Phone: [phone]

## 2024-01-12 ENCOUNTER — Other Ambulatory Visit (HOSPITAL_COMMUNITY): Payer: Self-pay

## 2024-01-12 ENCOUNTER — Ambulatory Visit (HOSPITAL_COMMUNITY)
Admission: RE | Admit: 2024-01-12 | Discharge: 2024-01-12 | Disposition: A | Discharge status: Home / Self Care | Source: Ambulatory Visit | Attending: *Deleted | Admitting: *Deleted

## 2024-01-12 DIAGNOSIS — K469 Unspecified abdominal hernia without obstruction or gangrene: Secondary | ICD-10-CM | POA: Diagnosis not present

## 2024-01-12 DIAGNOSIS — Z432 Encounter for attention to ileostomy: Secondary | ICD-10-CM

## 2024-01-12 DIAGNOSIS — T8189XA Other complications of procedures, not elsewhere classified, initial encounter: Secondary | ICD-10-CM

## 2024-01-12 DIAGNOSIS — L24B3 Irritant contact dermatitis related to fecal or urinary stoma or fistula: Secondary | ICD-10-CM

## 2024-01-12 DIAGNOSIS — Y838 Other surgical procedures as the cause of abnormal reaction of the patient, or of later complication, without mention of misadventure at the time of the procedure: Secondary | ICD-10-CM | POA: Insufficient documentation

## 2024-01-12 DIAGNOSIS — Z932 Ileostomy status: Secondary | ICD-10-CM | POA: Insufficient documentation

## 2024-01-12 DIAGNOSIS — Z7689 Persons encountering health services in other specified circumstances: Secondary | ICD-10-CM | POA: Diagnosis not present

## 2024-01-12 NOTE — Progress Notes (Signed)
 [facility]   Reason for visit:  Abdominal hernia, nonhealing wound to abdomen, ileostomy HPI:  Perforated diverticulitis  nonhealing surgical wound  Past Medical History:  Diagnosis Date   Acute respiratory failure with hypoxia (HCC) 12/23/2022   Alcohol withdrawal syndrome, with delirium (HCC) 12/27/2022   COPD (chronic obstructive pulmonary disease) (HCC)    Diabetes mellitus without complication (HCC)    Diverticulitis    DVT (deep venous thrombosis) (HCC)    x3   Encephalopathy acute 12/28/2022   ETOH abuse    H/O colectomy    Hyperlipidemia    Hypertension    Sepsis associated hypotension (HCC) 08/03/2019   Smoker    Family History  Problem Relation Age of Onset   COPD Mother    Prostate cancer Father    Colon cancer Neg Hx    Rectal cancer Neg Hx    Stomach cancer Neg Hx    Esophageal cancer Neg Hx    Allergies  Allergen Reactions   Codeine Hives   Lisinopril Other (See Comments)    Other reaction(s): renal effects   Current Outpatient Medications  Medication Sig Dispense Refill Last Dose/Taking   Accu-Chek Softclix Lancets lancets Use to test blood sugar once daily 100 each 11    acetaminophen  (ACETAMINOPHEN  8 HOUR) 650 MG CR tablet Take 1 tablet (650 mg total) by mouth every 8 (eight) hours as needed for pain 90 tablet 1    ascorbic acid  (VITAMIN C ) 500 MG tablet Take 1 tablet (500 mg total) by mouth daily. 30 tablet 1    atorvastatin  (LIPITOR) 10 MG tablet Take 1 tablet (10 mg total) by mouth daily. 90 tablet 1    cetirizine  (ZYRTEC ) 10 MG tablet Take 1 tablet (10 mg total) by mouth daily. 90 tablet 1    dapagliflozin  propanediol (FARXIGA ) 10 MG TABS tablet Take 1 tablet (10 mg total) by mouth daily before breakfast. 90 tablet 2    fluticasone  (FLOVENT  HFA) 110 MCG/ACT inhaler Inhale 1 puff into the lungs in the morning and at bedtime. 12 g 2    folic acid  (FOLVITE ) 1 MG tablet Take 1 tablet (1 mg total) by mouth daily. 90 tablet 0    gabapentin   (NEURONTIN ) 300 MG capsule Take 2 capsules (600 mg total) by mouth 2 (two) times daily. 360 capsule 0    glucose blood (ACCU-CHEK GUIDE TEST) test strip Use to test blood sugar once daily 100 each 11    guaiFENesin  (MUCINEX ) 600 MG 12 hr tablet Take 1 tablet (600 mg total) by mouth 2 (two) times daily as needed to loosen phlegm or cough. 180 tablet 1    hydrOXYzine  (VISTARIL ) 50 MG capsule Take 1 capsule (50 mg total) by mouth at bedtime as needed. 30 capsule 2    ipratropium-albuterol  (DUONEB) 0.5-2.5 (3) MG/3ML SOLN Take 3 mLs by nebulization every 6 (six) hours as needed (wheezing / sob).      losartan  (COZAAR ) 25 MG tablet Take 1 tablet (25 mg total) by mouth daily. 30 tablet 5    metFORMIN  (GLUCOPHAGE ) 500 MG tablet Take 2 tablets (1,000 mg total) by mouth daily with breakfast AND 1 tablet (500 mg total) daily with supper. 90 tablet 3    methylPREDNISolone  (MEDROL  DOSEPAK) 4 MG TBPK tablet Take as directed 21 tablet 0    metoprolol  succinate (TOPROL -XL) 25 MG 24 hr tablet Take 1 tablet (25 mg total) by mouth daily. 90 tablet 3    Tiotropium Bromide -Olodaterol 2.5-2.5 MCG/ACT AERS Inhale  2 puffs into the lungs daily. 4 g 3    torsemide  (DEMADEX ) 20 MG tablet Take 4 tablets (80 mg total) by mouth daily. 120 tablet 3    triamcinolone  cream (KENALOG ) 0.5 % Apply topically to legs twice daily for up to 2 weeks as needed for eczema 30 g 1    No current facility-administered medications for this encounter.   ROS  Review of Systems  Constitutional:  Positive for fatigue and unexpected weight change.       Shifts in weight due to CHF.   Respiratory:  Positive for shortness of breath.   Cardiovascular:        CHF  Shortness of breath on exertion Pacemaker   Musculoskeletal:  Positive for gait problem.  Neurological:  Positive for numbness (neuropathy feet).  Psychiatric/Behavioral: Negative.     Vital signs:  BP (!) 145/84   Temp 98.2 F (36.8 C) (Oral)   Resp 18   SpO2 94%  Exam:   Physical Exam Constitutional:      Appearance: He is obese.  Cardiovascular:     Rate and Rhythm: Normal rate.  Pulmonary:     Comments: Shortness of breath coming into appointment  Uses wheelchair Abdominal:     Hernia: A hernia (abdominal hernia) is present.  Skin:    General: Skin is warm and dry.     Findings: Lesion present.     Comments: Nonhealing wounds to abdomen.   Neurological:     Mental Status: He is alert and oriented to person, place, and time. Mental status is at baseline.  Psychiatric:        Mood and Affect: Mood normal.        Behavior: Behavior normal.     Stoma type/location:  RLQ ileostomy Stomal assessment/size:  1 recessed Peristomal assessment:  erythema Treatment options for stomal/peristomal skin: barrier ring and 1 piece flat pouch  Output: liquid yellow stool  Ostomy pouching: 1pc. Flat (patient preference)  Education provided:  Ongoing wound care with aquacel.  Wash periwound skin with VASHE to improve medical adhesive related skin injury.     Impression/dx  Ileostomy Irritant contact dermatitis Nonhealing wound Abdominal hernia Discussion  Continue wound and ostomy care.  Plan  See back as needed    Visit time: 45 minutes.   Darice Cooley FNP-BC

## 2024-01-15 ENCOUNTER — Other Ambulatory Visit (HOSPITAL_COMMUNITY): Payer: Self-pay

## 2024-01-15 ENCOUNTER — Other Ambulatory Visit: Payer: Self-pay

## 2024-01-16 ENCOUNTER — Encounter (HOSPITAL_COMMUNITY)

## 2024-01-18 ENCOUNTER — Encounter (HOSPITAL_COMMUNITY)

## 2024-01-18 ENCOUNTER — Encounter: Payer: Self-pay | Admitting: Cardiology

## 2024-01-19 ENCOUNTER — Encounter (HOSPITAL_COMMUNITY)
Admission: RE | Admit: 2024-01-19 | Discharge: 2024-01-19 | Disposition: A | Discharge status: Home / Self Care | Source: Ambulatory Visit | Attending: Nurse Practitioner | Admitting: Nurse Practitioner

## 2024-01-19 ENCOUNTER — Other Ambulatory Visit (HOSPITAL_COMMUNITY): Payer: Self-pay

## 2024-01-19 DIAGNOSIS — R531 Weakness: Secondary | ICD-10-CM | POA: Insufficient documentation

## 2024-01-19 DIAGNOSIS — T8189XA Other complications of procedures, not elsewhere classified, initial encounter: Secondary | ICD-10-CM | POA: Diagnosis not present

## 2024-01-19 DIAGNOSIS — K432 Incisional hernia without obstruction or gangrene: Secondary | ICD-10-CM | POA: Insufficient documentation

## 2024-01-19 DIAGNOSIS — L24B3 Irritant contact dermatitis related to fecal or urinary stoma or fistula: Secondary | ICD-10-CM | POA: Diagnosis not present

## 2024-01-19 DIAGNOSIS — L231 Allergic contact dermatitis due to adhesives: Secondary | ICD-10-CM | POA: Diagnosis not present

## 2024-01-19 DIAGNOSIS — Z432 Encounter for attention to ileostomy: Secondary | ICD-10-CM

## 2024-01-19 DIAGNOSIS — R5381 Other malaise: Secondary | ICD-10-CM | POA: Diagnosis not present

## 2024-01-19 DIAGNOSIS — K9419 Other complications of enterostomy: Secondary | ICD-10-CM | POA: Diagnosis not present

## 2024-01-19 DIAGNOSIS — Y833 Surgical operation with formation of external stoma as the cause of abnormal reaction of the patient, or of later complication, without mention of misadventure at the time of the procedure: Secondary | ICD-10-CM | POA: Diagnosis not present

## 2024-01-19 DIAGNOSIS — Z7689 Persons encountering health services in other specified circumstances: Secondary | ICD-10-CM | POA: Diagnosis not present

## 2024-01-19 NOTE — Progress Notes (Signed)
 [facility]   Reason for visit:  RLQ ileostomy with nonhealing abdominal wound  Abdominal hernia  Recent hospitalization for pneumonia. Remains weak HPI:  Perforated diverticulitis  Past Medical History:  Diagnosis Date   Acute respiratory failure with hypoxia (HCC) 12/23/2022   Alcohol withdrawal syndrome, with delirium (HCC) 12/27/2022   COPD (chronic obstructive pulmonary disease) (HCC)    Diabetes mellitus without complication (HCC)    Diverticulitis    DVT (deep venous thrombosis) (HCC)    x3   Encephalopathy acute 12/28/2022   ETOH abuse    H/O colectomy    Hyperlipidemia    Hypertension    Sepsis associated hypotension (HCC) 08/03/2019   Smoker    Family History  Problem Relation Age of Onset   COPD Mother    Prostate cancer Father    Colon cancer Neg Hx    Rectal cancer Neg Hx    Stomach cancer Neg Hx    Esophageal cancer Neg Hx    Allergies  Allergen Reactions   Codeine Hives   Lisinopril Other (See Comments)    Other reaction(s): renal effects   Current Outpatient Medications  Medication Sig Dispense Refill Last Dose/Taking   Accu-Chek Softclix Lancets lancets Use to test blood sugar once daily 100 each 11    acetaminophen  (ACETAMINOPHEN  8 HOUR) 650 MG CR tablet Take 1 tablet (650 mg total) by mouth every 8 (eight) hours as needed for pain 90 tablet 1    ascorbic acid  (VITAMIN C ) 500 MG tablet Take 1 tablet (500 mg total) by mouth daily. 30 tablet 1    atorvastatin  (LIPITOR) 10 MG tablet Take 1 tablet (10 mg total) by mouth daily. 90 tablet 1    cetirizine  (ZYRTEC ) 10 MG tablet Take 1 tablet (10 mg total) by mouth daily. 90 tablet 1    dapagliflozin  propanediol (FARXIGA ) 10 MG TABS tablet Take 1 tablet (10 mg total) by mouth daily before breakfast. 90 tablet 2    fluticasone  (FLOVENT  HFA) 110 MCG/ACT inhaler Inhale 1 puff into the lungs in the morning and at bedtime. 12 g 2    folic acid  (FOLVITE ) 1 MG tablet Take 1 tablet (1 mg total) by mouth  daily. 90 tablet 0    gabapentin  (NEURONTIN ) 300 MG capsule Take 2 capsules (600 mg total) by mouth 2 (two) times daily. 360 capsule 0    glucose blood (ACCU-CHEK GUIDE TEST) test strip Use to test blood sugar once daily 100 each 11    guaiFENesin  (MUCINEX ) 600 MG 12 hr tablet Take 1 tablet (600 mg total) by mouth 2 (two) times daily as needed to loosen phlegm or cough. 180 tablet 1    hydrOXYzine  (VISTARIL ) 50 MG capsule Take 1 capsule (50 mg total) by mouth at bedtime as needed. 30 capsule 2    ipratropium-albuterol  (DUONEB) 0.5-2.5 (3) MG/3ML SOLN Take 3 mLs by nebulization every 6 (six) hours as needed (wheezing / sob).      losartan  (COZAAR ) 25 MG tablet Take 1 tablet (25 mg total) by mouth daily. 30 tablet 5    metFORMIN  (GLUCOPHAGE ) 500 MG tablet Take 2 tablets (1,000 mg total) by mouth daily with breakfast AND 1 tablet (500 mg total) daily with supper. 90 tablet 3    methylPREDNISolone  (MEDROL  DOSEPAK) 4 MG TBPK tablet Take as directed 21 tablet 0    metoprolol  succinate (TOPROL -XL) 25 MG 24 hr tablet Take 1 tablet (25 mg total) by mouth daily. 90 tablet 3    Tiotropium  Bromide-Olodaterol 2.5-2.5 MCG/ACT AERS Inhale 2 puffs into the lungs daily. 4 g 3    torsemide  (DEMADEX ) 20 MG tablet Take 4 tablets (80 mg total) by mouth daily. 120 tablet 3    triamcinolone  cream (KENALOG ) 0.5 % Apply topically to legs twice daily for up to 2 weeks as needed for eczema 30 g 1    No current facility-administered medications for this encounter.   ROS  Review of Systems  Constitutional:  Positive for diaphoresis.       I sweat a lot  HENT:  Positive for dental problem.        Needs tooth extraction, is calling dentist  Respiratory:  Positive for cough (recent hospitalization for pneumonia).   Cardiovascular:  Positive for leg swelling.       CHF with edema at times,   Gastrointestinal:        RLQ ileostomy Abdominal hernia  Musculoskeletal:  Positive for gait problem.  Skin:  Positive for color  change, rash and wound (abdominal wound, chronic).  Neurological:  Positive for numbness.       Neuropathy in feet  All other systems reviewed and are negative.  Vital signs:  There were no vitals taken for this visit. Exam:  Physical Exam Vitals reviewed.  Cardiovascular:     Rate and Rhythm: Normal rate.  Pulmonary:     Comments: Cough and diminished breath sounds.  Abdominal:     Hernia: A hernia (abdominal hernia) is present.  Musculoskeletal:     Comments: Weakness/debility  Skin:    General: Skin is warm and dry.     Findings: Lesion present.  Neurological:     Mental Status: He is alert and oriented to person, place, and time.  Psychiatric:        Mood and Affect: Mood normal.        Behavior: Behavior normal.     Stoma type/location:  RLQ ileostomy, flush Stomal assessment/size:  1  Peristomal assessment:  denuded skin from 2 to 6 o'clock. He states pouch had leaked  Treatment options for stomal/peristomal skin: barrier ring and 1 piece flat pouch.  Output: liquid tan stool Ostomy pouching: 1pc. Education provided:  performed wound care with VASHE and aquacel.  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 Has upcoming appointment with Dr Geroge in [place] regarding ostomy reversal and hernia repair.  Plan  See back as needed.     Visit time: 45 minutes.   Darice Cooley FNP-BC

## 2024-01-22 DIAGNOSIS — Z7689 Persons encountering health services in other specified circumstances: Secondary | ICD-10-CM | POA: Diagnosis not present

## 2024-01-23 ENCOUNTER — Other Ambulatory Visit: Payer: Self-pay | Admitting: Podiatry

## 2024-01-23 ENCOUNTER — Other Ambulatory Visit (HOSPITAL_COMMUNITY): Payer: Self-pay

## 2024-01-23 ENCOUNTER — Ambulatory Visit (HOSPITAL_COMMUNITY)
Admission: RE | Admit: 2024-01-23 | Discharge: 2024-01-23 | Disposition: A | Discharge status: Home / Self Care | Source: Ambulatory Visit | Attending: Nurse Practitioner | Admitting: Nurse Practitioner

## 2024-01-23 ENCOUNTER — Encounter (HOSPITAL_COMMUNITY)

## 2024-01-23 DIAGNOSIS — Z87891 Personal history of nicotine dependence: Secondary | ICD-10-CM | POA: Diagnosis not present

## 2024-01-23 DIAGNOSIS — K469 Unspecified abdominal hernia without obstruction or gangrene: Secondary | ICD-10-CM

## 2024-01-23 DIAGNOSIS — K9419 Other complications of enterostomy: Secondary | ICD-10-CM | POA: Diagnosis not present

## 2024-01-23 DIAGNOSIS — Z950 Presence of cardiac pacemaker: Secondary | ICD-10-CM | POA: Diagnosis not present

## 2024-01-23 DIAGNOSIS — Z419 Encounter for procedure for purposes other than remedying health state, unspecified: Secondary | ICD-10-CM | POA: Diagnosis not present

## 2024-01-23 DIAGNOSIS — I110 Hypertensive heart disease with heart failure: Secondary | ICD-10-CM | POA: Diagnosis not present

## 2024-01-23 DIAGNOSIS — I509 Heart failure, unspecified: Secondary | ICD-10-CM | POA: Diagnosis not present

## 2024-01-23 DIAGNOSIS — Z432 Encounter for attention to ileostomy: Secondary | ICD-10-CM | POA: Diagnosis not present

## 2024-01-23 DIAGNOSIS — L231 Allergic contact dermatitis due to adhesives: Secondary | ICD-10-CM | POA: Insufficient documentation

## 2024-01-23 DIAGNOSIS — E669 Obesity, unspecified: Secondary | ICD-10-CM | POA: Diagnosis not present

## 2024-01-23 DIAGNOSIS — Z7689 Persons encountering health services in other specified circumstances: Secondary | ICD-10-CM | POA: Diagnosis not present

## 2024-01-23 NOTE — Progress Notes (Signed)
 [facility]   Reason for visit:  RLQ ileostomy  ongoing leaks due to large abdominal hernia  debility  HPI:  Perforated diverticulum  Past Medical History:  Diagnosis Date   Acute respiratory failure with hypoxia (HCC) 12/23/2022   Alcohol withdrawal syndrome, with delirium (HCC) 12/27/2022   COPD (chronic obstructive pulmonary disease) (HCC)    Diabetes mellitus without complication (HCC)    Diverticulitis    DVT (deep venous thrombosis) (HCC)    x3   Encephalopathy acute 12/28/2022   ETOH abuse    H/O colectomy    Hyperlipidemia    Hypertension    Sepsis associated hypotension (HCC) 08/03/2019   Smoker    Family History  Problem Relation Age of Onset   COPD Mother    Prostate cancer Father    Colon cancer Neg Hx    Rectal cancer Neg Hx    Stomach cancer Neg Hx    Esophageal cancer Neg Hx    Allergies  Allergen Reactions   Codeine Hives   Lisinopril Other (See Comments)    Other reaction(s): renal effects   Current Outpatient Medications  Medication Sig Dispense Refill Last Dose/Taking   acetaminophen  (ACETAMINOPHEN  8 HOUR) 650 MG CR tablet Take 1 tablet (650 mg total) by mouth every 8 (eight) hours as needed for pain 90 tablet 1    ascorbic acid  (VITAMIN C ) 500 MG tablet Take 1 tablet (500 mg total) by mouth daily. 30 tablet 1    atorvastatin  (LIPITOR) 10 MG tablet Take 1 tablet (10 mg total) by mouth daily. 90 tablet 1    cetirizine  (ZYRTEC ) 10 MG tablet Take 1 tablet (10 mg total) by mouth daily. 90 tablet 1    dapagliflozin  propanediol (FARXIGA ) 10 MG TABS tablet Take 1 tablet (10 mg total) by mouth daily before breakfast. 90 tablet 2    fluticasone  (FLOVENT  HFA) 110 MCG/ACT inhaler Inhale 1 puff into the lungs in the morning and at bedtime. 12 g 2    folic acid  (FOLVITE ) 1 MG tablet Take 1 tablet (1 mg total) by mouth daily. 90 tablet 0    gabapentin  (NEURONTIN ) 300 MG capsule Take 2 capsules (600 mg total) by mouth 2 (two) times daily. 360 capsule 0     glucose blood (ACCU-CHEK GUIDE TEST) test strip Use to test blood sugar once daily 100 each 11    guaiFENesin  (MUCINEX ) 600 MG 12 hr tablet Take 1 tablet (600 mg total) by mouth 2 (two) times daily as needed to loosen phlegm or cough. 180 tablet 1    hydrOXYzine  (VISTARIL ) 50 MG capsule Take 1 capsule (50 mg total) by mouth at bedtime as needed. 30 capsule 2    ipratropium-albuterol  (DUONEB) 0.5-2.5 (3) MG/3ML SOLN Take 3 mLs by nebulization every 6 (six) hours as needed (wheezing / sob).      losartan  (COZAAR ) 25 MG tablet Take 1 tablet (25 mg total) by mouth daily. 30 tablet 5    metFORMIN  (GLUCOPHAGE ) 500 MG tablet Take 2 tablets (1,000 mg total) by mouth daily with breakfast AND 1 tablet (500 mg total) daily with supper. 90 tablet 3    methylPREDNISolone  (MEDROL  DOSEPAK) 4 MG TBPK tablet Take as directed 21 tablet 0    metoprolol  succinate (TOPROL -XL) 25 MG 24 hr tablet Take 1 tablet (25 mg total) by mouth daily. 90 tablet 3    Tiotropium Bromide -Olodaterol 2.5-2.5 MCG/ACT AERS Inhale 2 puffs into the lungs daily. 4 g 3    torsemide  (DEMADEX ) 20  MG tablet Take 4 tablets (80 mg total) by mouth daily. 120 tablet 3    triamcinolone  cream (KENALOG ) 0.5 % Apply topically to legs twice daily for up to 2 weeks as needed for eczema 30 g 1    No current facility-administered medications for this encounter.   ROS  Review of Systems  Constitutional:  Positive for fatigue.  Respiratory:  Positive for shortness of breath.        CHF  Cardiovascular:        Pacemaker  Hypertension   Gastrointestinal:        Ileostomy   Skin:  Positive for wound.       Skin around wound with chronic inflammation from medical adhesive related skin injury.   Psychiatric/Behavioral:  The patient is nervous/anxious.        Chronic ETOH abuse  All other systems reviewed and are negative.  Vital signs:  BP 114/81   Pulse 60   Temp 98.6 F (37 C) (Oral)   Resp 18   SpO2 96%  Exam:  Physical Exam Vitals  reviewed.  Constitutional:      Appearance: He is obese.  HENT:     Mouth/Throat:     Mouth: Mucous membranes are moist.  Cardiovascular:     Rate and Rhythm: Normal rate.  Pulmonary:     Effort: Pulmonary effort is normal.     Comments: Shortness of breath with activity.  Abdominal:     Hernia: A hernia is present.  Skin:    General: Skin is warm and dry.     Findings: Rash present.  Neurological:     Mental Status: He is oriented to person, place, and time.     Stoma type/location:  RLQ ileostomy Stomal assessment/size:  1 round, recessed Peristomal assessment:  denuded skin from 12 to 6 o'clock  Treatment options for stomal/peristomal skin: stoma powder and skin prep  barrier ring Output: liquid brown stool  Ostomy pouching: 1pc. Flat  Education provided:  patient refuses convexity, wants flat pouch due to discomfort.  Could wear a belt with convex system.  He understands but does not want to consider    Impression/dx  Ileostomy Hernia without obstruction Discussion  Wound care performed to abdomen (aquacel)  Plan  See back as needed.     Visit time: 30 minutes.   Darice Cooley FNP-BC

## 2024-01-24 ENCOUNTER — Other Ambulatory Visit (HOSPITAL_COMMUNITY): Payer: Self-pay

## 2024-01-24 ENCOUNTER — Telehealth: Payer: Self-pay | Admitting: Podiatry

## 2024-01-24 NOTE — Progress Notes (Signed)
  Electrophysiology Office Follow up Visit Note:    Date:  01/25/2024   ID:  Luis [name], DOB [birth year], MRN [id number]  PCP:  Elnor [name], NP  [facility] Cardiologist:  None  [facility] Electrophysiologist:  OLE ONEIDA HOLTS, MD    Interval History:     Luis [name] is a 63 y.o. male who presents for a follow up visit.   He presents for follow-up.  He has a leadless pacemaker that was implanted December 31, 2020.  I last saw him in December 2023.  He was seen in the heart failure clinic January 05, 2024.  At that appointment he reported ongoing alcohol abuse.  Atrial flutter was diagnosed in September 2024.  It was rate controlled.  He is not on anticoagulation given his excessive alcohol use, ostomy complicated by bleeding and frequent falls.  He has been  He is doing okay today.  He has an appointment with an oral surgeon to discuss tooth extraction given a fractured tooth.  He still drinking alcohol.      Past medical, surgical, social and family history were reviewed.  ROS:   Please see the history of present illness.    All other systems reviewed and are negative.  EKGs/Labs/Other Studies Reviewed:    The following studies were reviewed today:  January 05, 2024 EKG shows atrial flutter, ventricular pacing at 60 bpm  January 25, 2024 in clinic device interrogation personally reviewed Battery and capture threshold okay.  I did increase his rate response to be more sensitive.  This seems to have helped his breathing with exertion during today's clinic appointment.       Physical Exam:    VS:  BP (!) 134/90 (BP Location: Right Arm, Patient Position: Sitting, Cuff Size: Large)   Pulse 78   Ht 6' 1 (1.854 m)   Wt 230 lb 11.2 oz (104.6 kg)   SpO2 96%   BMI 30.44 kg/m     Wt Readings from Last 3 Encounters:  01/25/24 230 lb 11.2 oz (104.6 kg)  01/05/24 232 lb 3.2 oz (105.3 kg)  01/01/24 231 lb (104.8 kg)     GEN: no distress CARD: RRR, No MRG RESP: No IWOB.  CTAB.      ASSESSMENT:    No diagnosis found. PLAN:    In order of problems listed above:   #Atrial flutter Not a candidate for anticoagulation given heavy alcohol abuse, frequent falls and ostomy complicated by bleeding.  Rate controlled.  I would continue with a rate control strategy for now.  He is not a candidate for invasive EP procedures.  #Permanent pacemaker in situ Leadless pacemaker functioning appropriately.  Continue remote monitoring.  Follow-up 1 year with APP  Signed, OLE HOLTS, MD, [facility], [facility] 01/25/2024 11:26 AM    Electrophysiology [facility]

## 2024-01-24 NOTE — Telephone Encounter (Signed)
 Called patient and left a message. His appointment needs to be rescheduled, mistakenly put him in a New Patient slot.

## 2024-01-25 ENCOUNTER — Other Ambulatory Visit (HOSPITAL_COMMUNITY): Payer: Self-pay

## 2024-01-25 ENCOUNTER — Ambulatory Visit: Attending: Cardiology | Admitting: Cardiology

## 2024-01-25 ENCOUNTER — Encounter: Payer: Self-pay | Admitting: Cardiology

## 2024-01-25 VITALS — BP 134/90 | HR 78 | Ht 73.0 in | Wt 230.7 lb

## 2024-01-25 DIAGNOSIS — I4892 Unspecified atrial flutter: Secondary | ICD-10-CM | POA: Diagnosis not present

## 2024-01-25 DIAGNOSIS — Z950 Presence of cardiac pacemaker: Secondary | ICD-10-CM | POA: Diagnosis not present

## 2024-01-25 DIAGNOSIS — Z7689 Persons encountering health services in other specified circumstances: Secondary | ICD-10-CM | POA: Diagnosis not present

## 2024-01-25 DIAGNOSIS — F1010 Alcohol abuse, uncomplicated: Secondary | ICD-10-CM | POA: Insufficient documentation

## 2024-01-25 NOTE — Patient Instructions (Addendum)
 Instructions:  Your physician recommends that you continue on your current medications as directed. Please refer to the Current Medication list given to you today.  *If you need a refill on your cardiac medications before your next appointment, please call your pharmacy*   Follow-Up: At [facility], you and your health needs are our priority.  As part of our continuing mission to provide you with exceptional heart care, our providers are all part of one team.  This team includes your primary Cardiologist (physician) and Advanced Practice Providers or APPs (Physician Assistants and Nurse Practitioners) who all work together to provide you with the care you need, when you need it.  Your next appointment:   1 year  Provider:   You will see one of the following Advanced Practice Providers on your designated Care Team:   Charlies Arthur, PA-C Ozell Jodie Passey, PA-C Suzann Riddle, NP

## 2024-01-26 ENCOUNTER — Ambulatory Visit (INDEPENDENT_AMBULATORY_CARE_PROVIDER_SITE_OTHER)

## 2024-01-26 ENCOUNTER — Other Ambulatory Visit: Payer: Self-pay

## 2024-01-26 ENCOUNTER — Encounter: Payer: Self-pay | Admitting: Podiatry

## 2024-01-26 ENCOUNTER — Ambulatory Visit (INDEPENDENT_AMBULATORY_CARE_PROVIDER_SITE_OTHER): Admitting: Podiatry

## 2024-01-26 DIAGNOSIS — Z7689 Persons encountering health services in other specified circumstances: Secondary | ICD-10-CM | POA: Diagnosis not present

## 2024-01-26 DIAGNOSIS — M79672 Pain in left foot: Secondary | ICD-10-CM

## 2024-01-26 DIAGNOSIS — M10072 Idiopathic gout, left ankle and foot: Secondary | ICD-10-CM | POA: Diagnosis not present

## 2024-01-26 DIAGNOSIS — M79671 Pain in right foot: Secondary | ICD-10-CM | POA: Diagnosis not present

## 2024-01-26 DIAGNOSIS — E1142 Type 2 diabetes mellitus with diabetic polyneuropathy: Secondary | ICD-10-CM

## 2024-01-26 DIAGNOSIS — M10071 Idiopathic gout, right ankle and foot: Secondary | ICD-10-CM

## 2024-01-27 ENCOUNTER — Other Ambulatory Visit (HOSPITAL_COMMUNITY): Payer: Self-pay

## 2024-01-29 ENCOUNTER — Ambulatory Visit: Admitting: Podiatry

## 2024-01-29 DIAGNOSIS — M10072 Idiopathic gout, left ankle and foot: Secondary | ICD-10-CM | POA: Diagnosis not present

## 2024-01-29 DIAGNOSIS — M10071 Idiopathic gout, right ankle and foot: Secondary | ICD-10-CM | POA: Diagnosis not present

## 2024-01-29 NOTE — Discharge Instructions (Signed)
 USe powder and skin prep to irritation.   Wash skin around wound with VASHE.  Apply aquacel

## 2024-01-30 ENCOUNTER — Telehealth: Payer: Self-pay | Admitting: *Deleted

## 2024-01-30 ENCOUNTER — Encounter: Payer: Self-pay | Admitting: *Deleted

## 2024-01-30 ENCOUNTER — Ambulatory Visit: Payer: Self-pay | Admitting: Podiatry

## 2024-01-30 ENCOUNTER — Ambulatory Visit (HOSPITAL_COMMUNITY)
Admission: RE | Admit: 2024-01-30 | Discharge: 2024-01-30 | Disposition: A | Discharge status: Home / Self Care | Source: Ambulatory Visit | Attending: *Deleted | Admitting: *Deleted

## 2024-01-30 ENCOUNTER — Other Ambulatory Visit: Payer: Self-pay | Admitting: Podiatry

## 2024-01-30 ENCOUNTER — Other Ambulatory Visit: Payer: Self-pay | Admitting: *Deleted

## 2024-01-30 ENCOUNTER — Other Ambulatory Visit (HOSPITAL_COMMUNITY): Payer: Self-pay

## 2024-01-30 DIAGNOSIS — T8189XA Other complications of procedures, not elsewhere classified, initial encounter: Secondary | ICD-10-CM | POA: Diagnosis not present

## 2024-01-30 DIAGNOSIS — K9419 Other complications of enterostomy: Secondary | ICD-10-CM | POA: Diagnosis not present

## 2024-01-30 DIAGNOSIS — Y839 Surgical procedure, unspecified as the cause of abnormal reaction of the patient, or of later complication, without mention of misadventure at the time of the procedure: Secondary | ICD-10-CM | POA: Insufficient documentation

## 2024-01-30 DIAGNOSIS — Z932 Ileostomy status: Secondary | ICD-10-CM | POA: Diagnosis present

## 2024-01-30 DIAGNOSIS — K469 Unspecified abdominal hernia without obstruction or gangrene: Secondary | ICD-10-CM | POA: Diagnosis not present

## 2024-01-30 DIAGNOSIS — K9410 Enterostomy complication, unspecified: Secondary | ICD-10-CM | POA: Diagnosis not present

## 2024-01-30 DIAGNOSIS — Z7689 Persons encountering health services in other specified circumstances: Secondary | ICD-10-CM | POA: Diagnosis not present

## 2024-01-30 LAB — URIC ACID: Uric Acid: 14.5 mg/dL — ABNORMAL HIGH (ref 3.8–8.4)

## 2024-01-30 MED ORDER — METHYLPREDNISOLONE 4 MG PO TBPK
ORAL_TABLET | ORAL | 0 refills | Status: DC
  Filled 2024-01-30: qty 21, 6d supply, fill #0
  Filled 2024-01-30: qty 21, fill #0

## 2024-01-30 MED ORDER — ALLOPURINOL 100 MG PO TABS
100.0000 mg | ORAL_TABLET | Freq: Every day | ORAL | 2 refills | Status: DC
  Filled 2024-01-30 (×2): qty 30, 30d supply, fill #0

## 2024-01-30 NOTE — Patient Outreach (Unsigned)
 Complex Care Management   Visit Note  01/31/2024  Name:  Luis [name] MRN: [id number] DOB: July 07, 1960  Situation: Referral received for Complex Care Management related to in home care needs I obtained verbal consent from Patient.  Visit completed with patient  on the phone.  Background:   Past Medical History:  Diagnosis Date   Acute respiratory failure with hypoxia (HCC) 12/23/2022   Alcohol withdrawal syndrome, with delirium (HCC) 12/27/2022   COPD (chronic obstructive pulmonary disease) (HCC)    Diabetes mellitus without complication (HCC)    Diverticulitis    DVT (deep venous thrombosis) (HCC)    x3   Encephalopathy acute 12/28/2022   ETOH abuse    H/O colectomy    Hyperlipidemia    Hypertension    Sepsis associated hypotension (HCC) 08/03/2019   Smoker     Assessment: Patient Reported Symptoms:  Cognitive Cognitive Status: Alert and oriented to person, place, and time      Neurological Neurological Review of Symptoms: No symptoms reported    HEENT HEENT Symptoms Reported: No symptoms reported      Cardiovascular Cardiovascular Symptoms Reported: No symptoms reported    Respiratory Respiratory Symptoms Reported: No symptoms reported    Endocrine Endocrine Symptoms Reported: No symptoms reported Is patient diabetic?: Yes Is patient checking blood sugars at home?: No List most recent blood sugar readings, include date and time of day: per patient , he does not check his blood sugar, contiues to take metformin     Gastrointestinal Gastrointestinal Symptoms Reported: No symptoms reported      Genitourinary Genitourinary Symptoms Reported: Not assessed    Integumentary      Musculoskeletal Musculoskelatal Symptoms Reviewed: Not assessed        Psychosocial Psychosocial Symptoms Reported: Not assessed          01/31/2024    PHQ2-9 Depression Screening   Little interest or pleasure in doing things    Feeling down, depressed, or hopeless    PHQ-2 - Total  Score    Trouble falling or staying asleep, or sleeping too much    Feeling tired or having little energy    Poor appetite or overeating     Feeling bad about yourself - or that you are a failure or have let yourself or your family down    Trouble concentrating on things, such as reading the newspaper or watching television    Moving or speaking so slowly that other people could have noticed.  Or the opposite - being so fidgety or restless that you have been moving around a lot more than usual    Thoughts that you would be better off dead, or hurting yourself in some way    PHQ2-9 Total Score    If you checked off any problems, how difficult have these problems made it for you to do your work, take care of things at home, or get along with other people    Depression Interventions/Treatment      There were no vitals filed for this visit.  Medications Reviewed Today     Reviewed by Ermalinda Lenn HERO, LCSW (Social Worker) on 01/30/24 at 1040  Med List Status: <None>   Medication Order Taking? Sig Documenting Provider Last Dose Status Informant  acetaminophen  (ACETAMINOPHEN  8 HOUR) 650 MG CR tablet [id number]  Take 1 tablet (650 mg total) by mouth every 8 (eight) hours as needed for pain   Active Self, Pharmacy Records, Multiple Informants  Med Note KARLYNN, HEDDY HERO   Tue Sep 12, 2023  1:49 PM) Reports takes 1200 mg at 6am, 1200mg  at 12N, 1200mg  at 6pm and 1200mg  again.  ascorbic acid  (VITAMIN C ) 500 MG tablet [id number]  Take 1 tablet (500 mg total) by mouth daily.   Active Self, Pharmacy Records, Multiple Informants  atorvastatin  (LIPITOR) 10 MG tablet [id number]  Take 1 tablet (10 mg total) by mouth daily. Webb, Padonda B, FNP  Active   cetirizine  (ZYRTEC ) 10 MG tablet [id number]  Take 1 tablet (10 mg total) by mouth daily. Webb, Padonda B, FNP  Active   dapagliflozin  propanediol (FARXIGA ) 10 MG TABS tablet [id number]  Take 1 tablet (10 mg total) by mouth daily before breakfast. Webb,  Padonda B, FNP  Active   fluticasone  (FLOVENT  HFA) 110 MCG/ACT inhaler [id number]  Inhale 1 puff into the lungs in the morning and at bedtime. Danton Reyes DASEN, MD  Active   folic acid  (FOLVITE ) 1 MG tablet [id number]  Take 1 tablet (1 mg total) by mouth daily. Douglass Kenney NOVAK, FNP  Active Self, Pharmacy Records, Multiple Informants  gabapentin  (NEURONTIN ) 300 MG capsule [id number]  Take 2 capsules (600 mg total) by mouth 2 (two) times daily. Sikora, Rebecca, DPM  Active   glucose blood (ACCU-CHEK GUIDE TEST) test strip [id number]  Use to test blood sugar once daily Elnor [name], NP  Active Self, Pharmacy Records, Multiple Informants  guaiFENesin  (MUCINEX ) 600 MG 12 hr tablet [id number]  Take 1 tablet (600 mg total) by mouth 2 (two) times daily as needed to loosen phlegm or cough. Elnor [name], NP  Active Self, Pharmacy Records, Multiple Informants           Med Note (LEE, NICOLE   Wed Dec 13, 2023  2:51 AM)    hydrOXYzine  (VISTARIL ) 50 MG capsule [id number]  Take 1 capsule (50 mg total) by mouth at bedtime as needed. Elnor [name], NP  Active Self, Pharmacy Records, Multiple Informants    Discontinued 03/15/23 1105 (Change in therapy)   ipratropium-albuterol  (DUONEB) 0.5-2.5 (3) MG/3ML SOLN [id number]  Take 3 mLs by nebulization every 6 (six) hours as needed (wheezing / sob). [provider]  Active Self, Pharmacy Records, Multiple Informants  losartan  (COZAAR ) 25 MG tablet [id number]  Take 1 tablet (25 mg total) by mouth daily. Sabharwal, Aditya, DO  Active   metFORMIN  (GLUCOPHAGE ) 500 MG tablet [id number]  Take 2 tablets (1,000 mg total) by mouth daily with breakfast AND 1 tablet (500 mg total) daily with supper. Douglass Kenney B, FNP  Active   methylPREDNISolone  (MEDROL  DOSEPAK) 4 MG TBPK tablet [id number]  Take as directed Sikora, Rebecca, DPM  Active   metoprolol  succinate (TOPROL -XL) 25 MG 24 hr tablet [id number]  Take 1 tablet (25 mg total) by mouth daily. Sabharwal, Aditya, DO  Active Self,  Pharmacy Records, Multiple Informants  Tiotropium Bromide -Olodaterol 2.5-2.5 MCG/ACT AERS [id number]  Inhale 2 puffs into the lungs daily. Hope Almarie ORN, NP  Active Self, Pharmacy Records, Multiple Informants  torsemide  (DEMADEX ) 20 MG tablet [id number]  Take 4 tablets (80 mg total) by mouth daily. Webb, Padonda B, FNP  Active Self, Pharmacy Records, Multiple Informants  triamcinolone  cream (KENALOG ) 0.5 % [id number]  Apply topically to legs twice daily for up to 2 weeks as needed for eczema Elnor [name], NP  Active Self, Pharmacy Records, Multiple Informants            Recommendation:   PCP Follow-up Specialty provider follow-up as scheduled  Please Technical brewer [phone] to complete in home assessment.  Follow Up Plan:   Telephone follow up appointment date/time:  01/31/24 12pm  Iva Montelongo, LCSW [place]  [facility], [facility] Health Licensed Clinical Social Worker  Direct Dial: [phone]

## 2024-01-30 NOTE — Telephone Encounter (Signed)
 Final letter mailed to home. No response to previous outreach to schedule SDMV and LDCT. Pt noted to be hospitalized during outreaches. Message sent to PCP.

## 2024-01-30 NOTE — Progress Notes (Signed)
 [facility]   Reason for visit:  RLQ ileostomy, flush Ventral hernia Nonhealing abdominal surgical wound HPI:  Perforated diverticulum with ileostomy Past Medical History:  Diagnosis Date   Acute respiratory failure with hypoxia (HCC) 12/23/2022   Alcohol withdrawal syndrome, with delirium (HCC) 12/27/2022   COPD (chronic obstructive pulmonary disease) (HCC)    Diabetes mellitus without complication (HCC)    Diverticulitis    DVT (deep venous thrombosis) (HCC)    x3   Encephalopathy acute 12/28/2022   ETOH abuse    H/O colectomy    Hyperlipidemia    Hypertension    Sepsis associated hypotension (HCC) 08/03/2019   Smoker    Family History  Problem Relation Age of Onset   COPD Mother    Prostate cancer Father    Colon cancer Neg Hx    Rectal cancer Neg Hx    Stomach cancer Neg Hx    Esophageal cancer Neg Hx    Allergies  Allergen Reactions   Codeine Hives   Lisinopril Other (See Comments)    Other reaction(s): renal effects   Current Outpatient Medications  Medication Sig Dispense Refill Last Dose/Taking   acetaminophen  (ACETAMINOPHEN  8 HOUR) 650 MG CR tablet Take 1 tablet (650 mg total) by mouth every 8 (eight) hours as needed for pain 90 tablet 1    allopurinol  (ZYLOPRIM ) 100 MG tablet Take 1 tablet (100 mg total) by mouth daily. 30 tablet 2    amoxicillin  (AMOXIL ) 500 MG capsule Take 1 capsule (500 mg total) by mouth 3 (three) times daily. 21 capsule 0    ascorbic acid  (VITAMIN C ) 500 MG tablet Take 1 tablet (500 mg total) by mouth daily. 30 tablet 1    atorvastatin  (LIPITOR) 10 MG tablet Take 1 tablet (10 mg total) by mouth daily. 90 tablet 1    cetirizine  (ZYRTEC ) 10 MG tablet Take 1 tablet (10 mg total) by mouth daily. 90 tablet 1    dapagliflozin  propanediol (FARXIGA ) 10 MG TABS tablet Take 1 tablet (10 mg total) by mouth daily before breakfast. 90 tablet 2    fluticasone  (FLOVENT  HFA) 110 MCG/ACT inhaler Inhale 1 puff into the lungs in the morning  and at bedtime. 12 g 2    folic acid  (FOLVITE ) 1 MG tablet Take 1 tablet (1 mg total) by mouth daily. 90 tablet 0    gabapentin  (NEURONTIN ) 300 MG capsule Take 2 capsules (600 mg total) by mouth 2 (two) times daily. 360 capsule 0    guaiFENesin  (MUCINEX ) 600 MG 12 hr tablet Take 1 tablet (600 mg total) by mouth 2 (two) times daily as needed to loosen phlegm or cough. 180 tablet 1    hydrOXYzine  (VISTARIL ) 50 MG capsule Take 1 capsule (50 mg total) by mouth at bedtime as needed. 30 capsule 2    ipratropium-albuterol  (DUONEB) 0.5-2.5 (3) MG/3ML SOLN Take 3 mLs by nebulization every 6 (six) hours as needed (wheezing / sob).      losartan  (COZAAR ) 25 MG tablet Take 1 tablet (25 mg total) by mouth daily. 30 tablet 5    metFORMIN  (GLUCOPHAGE ) 500 MG tablet Take 2 tablets (1,000 mg total) by mouth daily with breakfast AND 1 tablet (500 mg total) daily with supper. 90 tablet 3    methylPREDNISolone  (MEDROL  DOSEPAK) 4 MG TBPK tablet Take as directed 21 tablet 0    metoprolol  succinate (TOPROL -XL) 25 MG 24 hr tablet Take 1 tablet (25 mg total) by mouth daily. 90 tablet 3    Tiotropium  Bromide-Olodaterol 2.5-2.5 MCG/ACT AERS Inhale 2 puffs into the lungs daily. 4 g 3    torsemide  (DEMADEX ) 20 MG tablet Take 4 tablets (80 mg total) by mouth daily. 120 tablet 3    triamcinolone  cream (KENALOG ) 0.5 % Apply topically to legs twice daily for up to 2 weeks as needed for eczema 30 g 1    No current facility-administered medications for this encounter.   ROS  Review of Systems  Constitutional:  Positive for fatigue.  HENT:         Has tooth abscess, awaiting dental surgery this week  Respiratory: Negative.    Cardiovascular:        Pacemaker CHF Recent pneumonia  Gastrointestinal:        RLQ ileostomy Abdominal hernia   Endocrine:       Diabetes  Musculoskeletal:  Positive for gait problem.  Skin:  Positive for rash and wound.  Neurological:  Positive for numbness.       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        Frustration regarding inability to find surgeon for reversal  All other systems reviewed and are negative.  Vital signs:  BP (!) 154/82 (BP Location: Right Arm)   Pulse 63   Temp 98.6 F (37 C) (Oral)   Resp 20   SpO2 94%  Exam:  Physical Exam Constitutional:      Appearance: He is obese.  HENT:     Mouth/Throat:     Mouth: Mucous membranes are moist.     Comments: Dental infection Cardiovascular:     Rate and Rhythm: Normal rate.     Comments: pacemaker Pulmonary:     Breath sounds: Wheezing present.     Comments: Shortness of breath with activity Musculoskeletal:     Comments: weakness  Skin:    General: Skin is warm and dry.     Findings: Erythema, lesion and rash present.  Neurological:     Mental Status: He is alert and oriented to person, place, and time.     Sensory: Sensory deficit present.  Psychiatric:        Behavior: Behavior normal.     Stoma type/location:  RLQ ileostomy, flush with skin  Stomal assessment/size:  1 flush Peristomal assessment:  erythema and tenderness Treatment options for stomal/peristomal skin: stoma powder and skin prep with barrier ring Output: liquid and soft yellow stool Ostomy pouching: 1pc. flat Education provided:  No changes to pouching Wound care performed to abdomen with VASHE, aquacel and ABD pads    Impression/dx  Ileostomy complication Abdominal hernia Nonhealing surgical wound to abdomen Discussion  No change in pouching  Has upcoming appointment with new surgeon in [place] to discuss ostomy reversal and hernia repair.    Plan  Has dental appointment this week    Visit time: 45 minutes.   Darice Cooley FNP-BC

## 2024-01-30 NOTE — Patient Instructions (Signed)
 [name] - I am sorry I was unable to reach you today for our scheduled appointment. I work with Elnor [name], NP and am calling to support your healthcare needs. Please contact me at [phone] at your earliest convenience. I look forward to speaking with you soon.   Thank you,   Yesly Gerety, LCSW [place]  [facility] Licensed Clinical Social Worker  Direct Dial: [phone]

## 2024-01-31 ENCOUNTER — Other Ambulatory Visit (HOSPITAL_COMMUNITY): Payer: Self-pay

## 2024-01-31 ENCOUNTER — Other Ambulatory Visit: Payer: Self-pay | Admitting: *Deleted

## 2024-01-31 MED ORDER — AMOXICILLIN 500 MG PO CAPS
500.0000 mg | ORAL_CAPSULE | Freq: Three times a day (TID) | ORAL | 0 refills | Status: DC
  Filled 2024-01-31: qty 21, 7d supply, fill #0

## 2024-01-31 NOTE — Patient Instructions (Signed)
 Visit Information  Thank you for taking time to visit with me today. Please don't hesitate to contact me if I can be of assistance to you before our next scheduled appointment.  Your next care management appointment is by telephone on 01/31/24 at 12pm   Please call the care guide team at [phone] if you need to cancel, schedule, or reschedule an appointment.  Please call the Suicide and Crisis Lifeline: 988 call the [place]  National Suicide Prevention Lifeline: (503)158-8502 or TTY: 860-135-6799 TTY (832)019-8536) to talk to a trained counselor call 1-800-273-TALK (toll free, 24 hour hotline) if you are experiencing a Mental Health or Behavioral Health Crisis or need someone to talk to.  Jameila Keeny, LCSW   [facility], [facility] Health Licensed Clinical Social Worker  Direct Dial: [phone]

## 2024-02-01 ENCOUNTER — Other Ambulatory Visit (HOSPITAL_COMMUNITY): Payer: Self-pay

## 2024-02-01 DIAGNOSIS — S31109A Unspecified open wound of abdominal wall, unspecified quadrant without penetration into peritoneal cavity, initial encounter: Secondary | ICD-10-CM | POA: Diagnosis not present

## 2024-02-01 DIAGNOSIS — Z932 Ileostomy status: Secondary | ICD-10-CM | POA: Diagnosis not present

## 2024-02-01 NOTE — Patient Instructions (Signed)
 Visit Information  Thank you for taking time to visit with me today. Please don't hesitate to contact me if I can be of assistance to you before our next scheduled appointment.  Y Telephone follow up appointment date/time:  02/08/24 11:30am  Please call the care guide team at [phone] if you need to cancel, schedule, or reschedule an appointment.   Please call the Suicide and Crisis Lifeline: 988 call the [place]  National Suicide Prevention Lifeline: (708)682-5809 or TTY: (450)774-2424 TTY (603)839-8521) to talk to a trained counselor call 1-800-273-TALK (toll free, 24 hour hotline) if you are experiencing a Mental Health or Behavioral Health Crisis or need someone to talk to.  Alaena Strader, LCSW [place]  [facility], [facility] Licensed Clinical Social Worker  Direct Dial: [phone]

## 2024-02-01 NOTE — Patient Outreach (Signed)
 Complex Care Management   Visit Note  02/01/2024  Name:  Luis [name] MRN: [id number] DOB: 04/22/1961  Situation: Referral received for Complex Care Management related to in home care needs Patient has decided to not proceed further with the coordination of in home personal care services through his insurance company. I obtained verbal consent from Patient.  Visit completed with patient  on the phone on 01/31/24.f    Background:   Past Medical History:  Diagnosis Date   Acute respiratory failure with hypoxia (HCC) 12/23/2022   Alcohol withdrawal syndrome, with delirium (HCC) 12/27/2022   COPD (chronic obstructive pulmonary disease) (HCC)    Diabetes mellitus without complication (HCC)    Diverticulitis    DVT (deep venous thrombosis) (HCC)    x3   Encephalopathy acute 12/28/2022   ETOH abuse    H/O colectomy    Hyperlipidemia    Hypertension    Sepsis associated hypotension (HCC) 08/03/2019   Smoker     Assessment: Patient Reported Symptoms:  Cognitive Cognitive Status: Alert and oriented to person, place, and time, Normal speech and language skills Cognitive/Intellectual Conditions Management [RPT]: None reported or documented in medical history or problem list   Health Maintenance Behaviors: Annual physical exam Healing Pattern: Slow Health Facilitated by: Pain control, Rest  Neurological Neurological Review of Symptoms: No symptoms reported    HEENT HEENT Symptoms Reported: No symptoms reported      Cardiovascular Cardiovascular Symptoms Reported: No symptoms reported    Respiratory Respiratory Symptoms Reported: No symptoms reported Respiratory Management Strategies: Medication therapy  Endocrine Endocrine Symptoms Reported: No symptoms reported    Gastrointestinal Gastrointestinal Symptoms Reported: No symptoms reported Additional Gastrointestinal Details: Patient active with ostomy clinic, Per patient, he met with the surgeon and reports that there is nothing  more they can do in regards to reversing his ileostomy      Genitourinary Genitourinary Symptoms Reported: Not assessed    Integumentary Integumentary Symptoms Reported: Wound    Musculoskeletal Musculoskelatal Symptoms Reviewed: No symptoms reported        Psychosocial Psychosocial Symptoms Reported: Anger, Irritability Additional Psychological Details: Patient discussed increased frustration related to the results of the follow up appointment with his surgeon. No longer wants to follow up with in home care referral through is insurance company. Patient declined further involvement with this Child psychotherapist at this time.          02/01/2024    PHQ2-9 Depression Screening   Little interest or pleasure in doing things    Feeling down, depressed, or hopeless    PHQ-2 - Total Score    Trouble falling or staying asleep, or sleeping too much    Feeling tired or having little energy    Poor appetite or overeating     Feeling bad about yourself - or that you are a failure or have let yourself or your family down    Trouble concentrating on things, such as reading the newspaper or watching television    Moving or speaking so slowly that other people could have noticed.  Or the opposite - being so fidgety or restless that you have been moving around a lot more than usual    Thoughts that you would be better off dead, or hurting yourself in some way    PHQ2-9 Total Score    If you checked off any problems, how difficult have these problems made it for you to do your work, take care of things at home, or get along with other  people    Depression Interventions/Treatment      There were no vitals filed for this visit.  Medications Reviewed Today   Medications were not reviewed in this encounter     Recommendation:   PCP Follow-up Specialty provider follow-up as scheduled  Follow Up Plan:   Telephone follow up appointment date/time:  [facility]  02/08/24    Ryenn Howeth, LCSW [place]   [facility], [facility] Licensed Clinical Social Worker  Direct Dial: [phone]

## 2024-02-02 DIAGNOSIS — Z932 Ileostomy status: Secondary | ICD-10-CM | POA: Insufficient documentation

## 2024-02-02 NOTE — Discharge Instructions (Signed)
 No changes To see new surgeon this week

## 2024-02-06 ENCOUNTER — Ambulatory Visit (HOSPITAL_COMMUNITY)
Admission: RE | Admit: 2024-02-06 | Discharge: 2024-02-06 | Disposition: A | Discharge status: Home / Self Care | Source: Ambulatory Visit | Attending: Nurse Practitioner | Admitting: Nurse Practitioner

## 2024-02-06 DIAGNOSIS — I080 Rheumatic disorders of both mitral and aortic valves: Secondary | ICD-10-CM | POA: Diagnosis not present

## 2024-02-06 DIAGNOSIS — Z432 Encounter for attention to ileostomy: Secondary | ICD-10-CM

## 2024-02-06 DIAGNOSIS — T8189XA Other complications of procedures, not elsewhere classified, initial encounter: Secondary | ICD-10-CM

## 2024-02-06 DIAGNOSIS — E785 Hyperlipidemia, unspecified: Secondary | ICD-10-CM | POA: Diagnosis not present

## 2024-02-06 DIAGNOSIS — Z7689 Persons encountering health services in other specified circumstances: Secondary | ICD-10-CM | POA: Diagnosis not present

## 2024-02-06 DIAGNOSIS — E119 Type 2 diabetes mellitus without complications: Secondary | ICD-10-CM | POA: Diagnosis not present

## 2024-02-06 DIAGNOSIS — J449 Chronic obstructive pulmonary disease, unspecified: Secondary | ICD-10-CM | POA: Diagnosis not present

## 2024-02-06 DIAGNOSIS — I5032 Chronic diastolic (congestive) heart failure: Secondary | ICD-10-CM | POA: Diagnosis not present

## 2024-02-06 DIAGNOSIS — E669 Obesity, unspecified: Secondary | ICD-10-CM | POA: Insufficient documentation

## 2024-02-06 DIAGNOSIS — I110 Hypertensive heart disease with heart failure: Secondary | ICD-10-CM | POA: Insufficient documentation

## 2024-02-06 DIAGNOSIS — K439 Ventral hernia without obstruction or gangrene: Secondary | ICD-10-CM | POA: Diagnosis not present

## 2024-02-06 NOTE — Progress Notes (Signed)
 [facility]   Reason for visit:  RLq ileostomy with nonhealing abdominal wound and ventral hernia HPI:  Perforated diverticulum with end ileostomy and ventral hernia Past Medical History:  Diagnosis Date   Acute respiratory failure with hypoxia (HCC) 12/23/2022   Alcohol withdrawal syndrome, with delirium (HCC) 12/27/2022   COPD (chronic obstructive pulmonary disease) (HCC)    Diabetes mellitus without complication (HCC)    Diverticulitis    DVT (deep venous thrombosis) (HCC)    x3   Encephalopathy acute 12/28/2022   ETOH abuse    H/O colectomy    Hyperlipidemia    Hypertension    Sepsis associated hypotension (HCC) 08/03/2019   Smoker    Family History  Problem Relation Age of Onset   COPD Mother    Prostate cancer Father    Colon cancer Neg Hx    Rectal cancer Neg Hx    Stomach cancer Neg Hx    Esophageal cancer Neg Hx    Allergies  Allergen Reactions   Codeine Hives   Lisinopril Other (See Comments)    Other reaction(s): renal effects   Current Outpatient Medications  Medication Sig Dispense Refill Last Dose/Taking   acetaminophen  (ACETAMINOPHEN  8 HOUR) 650 MG CR tablet Take 1 tablet (650 mg total) by mouth every 8 (eight) hours as needed for pain 90 tablet 1    allopurinol  (ZYLOPRIM ) 100 MG tablet Take 1 tablet (100 mg total) by mouth daily. 30 tablet 2    amoxicillin  (AMOXIL ) 500 MG capsule Take 1 capsule (500 mg total) by mouth 3 (three) times daily. 21 capsule 0    ascorbic acid  (VITAMIN C ) 500 MG tablet Take 1 tablet (500 mg total) by mouth daily. 30 tablet 1    atorvastatin  (LIPITOR) 10 MG tablet Take 1 tablet (10 mg total) by mouth daily. 90 tablet 1    cetirizine  (ZYRTEC ) 10 MG tablet Take 1 tablet (10 mg total) by mouth daily. 90 tablet 1    dapagliflozin  propanediol (FARXIGA ) 10 MG TABS tablet Take 1 tablet (10 mg total) by mouth daily before breakfast. 90 tablet 2    fluticasone  (FLOVENT  HFA) 110 MCG/ACT inhaler Inhale 1 puff into the lungs  in the morning and at bedtime. 12 g 2    folic acid  (FOLVITE ) 1 MG tablet Take 1 tablet (1 mg total) by mouth daily. 90 tablet 0    gabapentin  (NEURONTIN ) 300 MG capsule Take 2 capsules (600 mg total) by mouth 2 (two) times daily. 360 capsule 0    guaiFENesin  (MUCINEX ) 600 MG 12 hr tablet Take 1 tablet (600 mg total) by mouth 2 (two) times daily as needed to loosen phlegm or cough. 180 tablet 1    hydrOXYzine  (VISTARIL ) 50 MG capsule Take 1 capsule (50 mg total) by mouth at bedtime as needed. 30 capsule 2    ipratropium-albuterol  (DUONEB) 0.5-2.5 (3) MG/3ML SOLN Take 3 mLs by nebulization every 6 (six) hours as needed (wheezing / sob).      losartan  (COZAAR ) 25 MG tablet Take 1 tablet (25 mg total) by mouth daily. 30 tablet 5    metFORMIN  (GLUCOPHAGE ) 500 MG tablet Take 2 tablets (1,000 mg total) by mouth daily with breakfast AND 1 tablet (500 mg total) daily with supper. 90 tablet 3    methylPREDNISolone  (MEDROL  DOSEPAK) 4 MG TBPK tablet Take as directed (Patient not taking: Reported on 02/08/2024) 21 tablet 0    metoprolol  succinate (TOPROL -XL) 25 MG 24 hr tablet Take 1 tablet (25 mg total)  by mouth daily. 90 tablet 3    Tiotropium Bromide -Olodaterol 2.5-2.5 MCG/ACT AERS Inhale 2 puffs into the lungs daily. 4 g 3    torsemide  (DEMADEX ) 20 MG tablet Take 4 tablets (80 mg total) by mouth daily. 120 tablet 3    triamcinolone  cream (KENALOG ) 0.5 % Apply topically to legs twice daily for up to 2 weeks as needed for eczema 30 g 1    No current facility-administered medications for this encounter.   ROS  Review of Systems Vital signs:  BP (!) 166/95   Pulse 65   Temp 97.7 F (36.5 C) (Oral)   Resp 20   SpO2 95%  Exam:  Physical Exam  Stoma type/location:  RLQ ileostomy Stomal assessment/size:  1 recessed Peristomal assessment:  irritant contact dermatitis, abdominal hernia Treatment options for stomal/peristomal skin: stoma powder and skin prep, barrier ring and 1 piece flat pouch Output:  soft brown stool  Ostomy pouching: 1pc. Flat with barrier ring  Education provided:  Patient is distressed as the surgeon's nurse called and stated that an ostomy takedown and hernia repair was not possible as his condition was inoperable. I cannot find evidence of this.  He is distraught.  He is unsure why.  We discuss impediments to healing (lifestyle such as smoking and drinking) and how an unsuccessful surgery could yield further complications and decreased quality of life.  He is despondent and is considering getting a second opinion.  I counsel him on     Impression/dx  *** Discussion  *** Plan  ***    Visit time: *** minutes.   Darice Cooley FNP-BC

## 2024-02-06 NOTE — Discharge Instructions (Signed)
 Go to [facility] for records release Call Dr Heniford's office for appointment. (Heniford has retired, but another Careers adviser may see you)

## 2024-02-07 ENCOUNTER — Ambulatory Visit (HOSPITAL_BASED_OUTPATIENT_CLINIC_OR_DEPARTMENT_OTHER)
Admission: RE | Admit: 2024-02-07 | Discharge: 2024-02-07 | Disposition: A | Discharge status: Home / Self Care | Source: Ambulatory Visit | Attending: Nurse Practitioner | Admitting: Nurse Practitioner

## 2024-02-07 DIAGNOSIS — I110 Hypertensive heart disease with heart failure: Secondary | ICD-10-CM | POA: Diagnosis not present

## 2024-02-07 DIAGNOSIS — I5032 Chronic diastolic (congestive) heart failure: Secondary | ICD-10-CM

## 2024-02-07 DIAGNOSIS — J449 Chronic obstructive pulmonary disease, unspecified: Secondary | ICD-10-CM | POA: Diagnosis not present

## 2024-02-07 DIAGNOSIS — E785 Hyperlipidemia, unspecified: Secondary | ICD-10-CM | POA: Diagnosis not present

## 2024-02-07 DIAGNOSIS — E669 Obesity, unspecified: Secondary | ICD-10-CM | POA: Diagnosis not present

## 2024-02-07 DIAGNOSIS — I080 Rheumatic disorders of both mitral and aortic valves: Secondary | ICD-10-CM | POA: Diagnosis not present

## 2024-02-07 DIAGNOSIS — E119 Type 2 diabetes mellitus without complications: Secondary | ICD-10-CM | POA: Diagnosis not present

## 2024-02-07 MED ORDER — PERFLUTREN LIPID MICROSPHERE
1.0000 mL | INTRAVENOUS | Status: AC | PRN
  Administered 2024-02-07: 3 mL via INTRAVENOUS
  Filled 2024-02-07: qty 10

## 2024-02-08 ENCOUNTER — Other Ambulatory Visit: Payer: Self-pay

## 2024-02-08 DIAGNOSIS — H5213 Myopia, bilateral: Secondary | ICD-10-CM | POA: Diagnosis not present

## 2024-02-08 LAB — ECHOCARDIOGRAM COMPLETE
AR max vel: 2.16 cm2
AV Area VTI: 2.33 cm2
AV Area mean vel: 2.11 cm2
AV Mean grad: 5.7 mmHg
AV Peak grad: 11.3 mmHg
Ao pk vel: 1.68 m/s
Area-P 1/2: 4.06 cm2
S' Lateral: 2.7 cm

## 2024-02-08 NOTE — Patient Outreach (Signed)
 Complex Care Management   Visit Note  02/08/2024  Name:  Luis [name] MRN: [id number] DOB: 06-Mar-1961  Situation: Referral received for Complex Care Management related to Heart Failure and COPD I obtained verbal consent from Patient.  Visit completed with Patient  on the phone  Background:   Past Medical History:  Diagnosis Date   Acute respiratory failure with hypoxia (HCC) 12/23/2022   Alcohol withdrawal syndrome, with delirium (HCC) 12/27/2022   COPD (chronic obstructive pulmonary disease) (HCC)    Diabetes mellitus without complication (HCC)    Diverticulitis    DVT (deep venous thrombosis) (HCC)    x3   Encephalopathy acute 12/28/2022   ETOH abuse    H/O colectomy    Hyperlipidemia    Hypertension    Sepsis associated hypotension (HCC) 08/03/2019   Smoker    Assessment: Patient very short with RNCM and states he is not going to talk long. Informed RNCM that the call interrupted a movie that he was ready to get off the phone. RNCM did not finish reviewing all Medications. Patient did not want to finish med review and patient stopped the review, stating his medications have not changed.  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 paitient reports he continues to take medications as prescribed.    HEENT HEENT Symptoms Reported: Other: (Patient reports, had two teeth surgically removed on Monday(02/06/24). no other complaints voiced at this time.)      Cardiovascular Cardiovascular Symptoms Reported: No symptoms reported Other Cardiovascular Symptoms: reports weight 224, reports completed scheduled Echo on yesterday 02/07/24. Does patient have uncontrolled Hypertension?: No Weight: 224 lb (101.6 kg) (home reading today)   Respiratory Respiratory Symptoms Reported: No symptoms reported Other Respiratory Symptoms: reports continues to take medications for allergies.    Endocrine Endocrine Symptoms Reported: No symptoms reported    Gastrointestinal Gastrointestinal Symptoms Reported: Other Other Gastrointestinal Symptoms: continues to see ostomy clinic regarding care of RLQ ileostomy. patient denies any changes.      Genitourinary Genitourinary Symptoms Reported: No symptoms reported    Integumentary Integumentary Symptoms Reported: Wound Additional Integumentary Details: continues ostomy clinic visits with Darice Mayo-last seen 02/06/24-Next visit scheduled for 02/13/24.    Musculoskeletal Musculoskelatal Symptoms Reviewed: No symptoms reported Additional Musculoskeletal Details: Patient report Gout in both feet. Patient states just completed Prednisone  dose pack with improvement. He reports prior to treatment he was 10/10 on the pain scale and now he is at 5/10. Musculoskeletal Management Strategies: Medication therapy, Routine screening      Psychosocial Psychosocial Symptoms Reported: Other Other Psychosocial Conditions: patient states does not want to talk long and just wants to lay back down. He states he was watching something on the television and the call interupted what he was watching. states will only talk a short time.          There were no vitals filed for this visit.  Medications Reviewed Today     Reviewed by Krystiana Fornes M, RN (Registered Nurse) on 02/08/24 at 1623  Med List Status: <None>   Medication Order Taking? Sig Documenting Provider Last Dose Status Informant  acetaminophen  (ACETAMINOPHEN  8 HOUR) 650 MG CR tablet [id number] Yes Take 1 tablet (650 mg total) by mouth every 8 (eight) hours as needed for pain   Active Self, Pharmacy Records, Multiple Informants  Med Note KARLYNN, Mateya Torti M   Tue Sep 12, 2023  1:49 PM) Reports takes 1200 mg at 6am, 1200mg  at 12N, 1200mg  at  6pm and 1200mg  again.  allopurinol  (ZYLOPRIM ) 100 MG tablet [id number] Yes Take 1 tablet (100 mg total) by mouth daily. Gershon Donnice SAUNDERS, DPM  Active   amoxicillin  (AMOXIL ) 500 MG capsule [id number] Yes Take 1 capsule (500 mg total) by mouth 3 (three) times daily. Grafton Lynwood LELON Mickey., DDS  Active   ascorbic acid  (VITAMIN C ) 500 MG tablet [id number] Yes Take 1 tablet (500 mg total) by mouth daily.   Active Self, Pharmacy Records, Multiple Informants  atorvastatin  (LIPITOR) 10 MG tablet [id number] Yes Take 1 tablet (10 mg total) by mouth daily. Webb, Padonda B, FNP  Active   cetirizine  (ZYRTEC ) 10 MG tablet [id number] Yes Take 1 tablet (10 mg total) by mouth daily. Webb, Padonda B, FNP  Active   dapagliflozin  propanediol (FARXIGA ) 10 MG TABS tablet [id number] Yes Take 1 tablet (10 mg total) by mouth daily before breakfast. Webb, Padonda B, FNP  Active   fluticasone  (FLOVENT  HFA) 110 MCG/ACT inhaler [id number] Yes Inhale 1 puff into the lungs in the morning and at bedtime. Danton Reyes DASEN, MD  Active   folic acid  (FOLVITE ) 1 MG tablet [id number] Yes Take 1 tablet (1 mg total) by mouth daily. Douglass Kenney NOVAK, FNP  Active Self, Pharmacy Records, Multiple Informants  gabapentin  (NEURONTIN ) 300 MG capsule [id number] Yes Take 2 capsules (600 mg total) by mouth 2 (two) times daily. Sikora, Rebecca, DPM  Active   guaiFENesin  (MUCINEX ) 600 MG 12 hr tablet [id number]  Take 1 tablet (600 mg total) by mouth 2 (two) times daily as needed to loosen phlegm or cough. Elnor [name], NP  Active Self, Pharmacy Records, Multiple Informants           Med Note (LEE, NICOLE   Wed Dec 13, 2023  2:51 AM)    hydrOXYzine  (VISTARIL ) 50 MG capsule [id number]  Take 1 capsule (50 mg total) by mouth at bedtime as needed. Elnor [name], NP  Active Self, Pharmacy Records, Multiple Informants    Discontinued 03/15/23 1105 (Change in therapy)   ipratropium-albuterol  (DUONEB) 0.5-2.5 (3) MG/3ML SOLN [id number]  Take 3 mLs by nebulization every 6  (six) hours as needed (wheezing / sob). [provider]  Active Self, Pharmacy Records, Multiple Informants  losartan  (COZAAR ) 25 MG tablet [id number]  Take 1 tablet (25 mg total) by mouth daily. Sabharwal, Aditya, DO  Active   metFORMIN  (GLUCOPHAGE ) 500 MG tablet [id number]  Take 2 tablets (1,000 mg total) by mouth daily with breakfast AND 1 tablet (500 mg total) daily with supper. Douglass Kenney B, FNP  Active   methylPREDNISolone  (MEDROL  DOSEPAK) 4 MG TBPK tablet [id number]  Take as directed  Patient not taking: Reported on 02/08/2024   Gershon Donnice SAUNDERS, DPM  Active   metoprolol  succinate (TOPROL -XL) 25 MG 24 hr tablet [id number]  Take 1 tablet (25 mg total) by mouth daily. Sabharwal, Aditya, DO  Active Self, Pharmacy Records, Multiple Informants  Tiotropium Bromide -Olodaterol 2.5-2.5 MCG/ACT AERS [id number]  Inhale 2 puffs into the lungs daily. Hope Almarie LELON, NP  Active Self, Pharmacy Records, Multiple Informants  torsemide  (DEMADEX ) 20 MG tablet [id number]  Take 4 tablets (80 mg total) by mouth daily. Webb, Padonda B, FNP  Active Self, Pharmacy Records, Multiple Informants  triamcinolone  cream (KENALOG ) 0.5 % [id number]  Apply topically to legs twice daily for up to 2 weeks as needed  for eczema Elnor [name], NP  Active Self, Pharmacy Records, Multiple Informants          Recommendation:   Continue Current Plan of Care Patient to contact provider with any worsening of signs/symptoms  Follow Up Plan:   Telephone follow up appointment date/time:  03/06/24 at 11:30 am  Heddy Shutter, RN, MSN, BSN, CCM [place]  [facility], Population Health Case Manager Phone: [phone]

## 2024-02-08 NOTE — Patient Instructions (Signed)
 Visit Information  Mr. Signorelli was given information about Medicaid Managed Care team care coordination services as a part of their [facility] Medicaid benefit.   If you would like to schedule transportation through your [facility] plan, please call the following number at least 2 days in advance of your appointment: [phone].   You can also use the MTM portal or MTM mobile app to manage your rides. Reimbursement for transportation is available through [facility]! For the portal, please go to [email or web address]  Call the [facility] Crisis Line at [phone], at any time, 24 hours a day, 7 days a week. If you are in danger or need immediate medical attention call 911.   Mr. Hildebran - following are the goals we discussed in your visit today:   Goals Addressed             This Visit's Progress    VBCI RN Care Plan-COPD       Problems:  Chronic Disease Management support and education needs related to COPD Lacks caregiver support. Non-adherence to prescribed medication regimen-patient reports has medications and denies any changes in medications except has taken prescribed medication Prednisone  for gout flare  Goal: Over the next 90 days the Patient will attend all scheduled medical appointments: with providers as evidenced by patient report and review of chart        continue to work with RN Care Manager and/or Social Worker to address care management and care coordination needs related to COPD as evidenced by adherence to care management team scheduled appointments     demonstrate Improved adherence to prescribed treatment plan for COPD as evidenced by patient report and review of chart take all medications exactly as prescribed and will call provider for medication related questions as evidenced by patient report and/or review of chart.     Interventions: COPD Interventions: Evaluation of current treatment plan for COPD and patient's adherence to plan   Patient  Self-Care Activities:  Attend all scheduled provider appointments Call provider office for new concerns or questions  Take medications as prescribed    Plan:  Telephone follow up appointment with care management team member scheduled for:  89/24/25 at 11:30 am     VBCI RN Care Plan-HF       Problems:  Chronic Disease Management support and education needs related to CHF  Goal: Over the next 90 days the Patient will attend all scheduled medical appointments: including ostomy nurse on 02/13/24  as evidenced by patient report and review of chart        continue to work with RN Care Manager and/or Social Worker to address care management and care coordination needs related to CHF as evidenced by adherence to care management team scheduled appointments     demonstrate Improved adherence to prescribed treatment plan for CHF as evidenced by  patient report or review of chart  Interventions:  Heart Failure Interventions: Discussed the importance of keeping all appointments with provider Discussed weights with patient advised patient to continue to weight and record weight. Notify provider, cardiology and/or Primary care provider if weights increased 2-3 pounds overnight or 5 pounds in a week. Reiterated signs/symptoms of volume overload Advised patient to contact cardiologist if signs/symptoms of volume overload  Patient Self-Care Activities:  Attend all scheduled provider appointments Call provider office for new concerns or questions  Take medications as prescribed   call office if I gain more than 2 pounds in one day or 5 pounds in one week and follow instructions  per HF clinic for as needed torsemide . Do the following things EVERYDAY: Weigh yourself in the morning before breakfast. Write it down and keep it in a log. Take your medicines as prescribed Eat low salt foods--Limit salt (sodium) to 2000 mg per day.  Stay as active as you can everyday Limit all fluids for the day to less than 2  liters  Plan:  Telephone follow up appointment with care management team member scheduled for:  03/06/24 at 11:30 am         Please see education materials related to Medications provided by MyChart link.  Patient verbalizes understanding of instructions and care plan provided today and agrees to view in MyChart. Active MyChart status and patient understanding of how to access instructions and care plan via MyChart confirmed with patient.     Telephone follow up appointment with Managed Medicaid care management team member scheduled for: 03/06/24 at 11:30 am  Heddy Shutter, RN, MSN, BSN, CCM [place]  [facility], Population Health Case Manager Phone: [phone]   Basics of Medicine Management Taking your medicines correctly is an important part of managing or preventing medical problems. Make sure you know what disease or condition your medicine is treating, and how and when to take it. If you do not take your medicine correctly, it may not work well and may cause unpleasant side effects, including serious health problems. What should I do when I am taking medicines?  Read all the labels and inserts that come with your medicines. Review the information often and with each refill. Talk with your pharmacist if you get a refill and notice a change in the size, color, or shape of your medicines. Know the potential side effects for each medicine that you take. Try to get all your medicines from the same pharmacy. The pharmacist will have all your information and will understand how your medicines will affect each other (interact). Always carry an updated list of your medicines with you. If there is an emergency, a first responder can quickly see what medicines you are taking. Tell your health care provider about all your medicines, including over-the-counter medicines, vitamins, and herbal or dietary supplements. Your health care provider will make sure that nothing will  interact with any of your prescribed medicines. How can I take my medicines safely? Take medicines only as told by your health care provider. Do not take more of your medicine than instructed. Do not take anyone else's medicines. Do not share your medicines with others. Do not stop taking your medicines unless your health care provider tells you to do so. You may need to avoid alcohol or certain foods or liquids when taking certain medicines. Follow your health care provider's instructions. Do not split, cut, crush, or chew your medicines unless your health care provider tells you to do so. Tell your health care provider if you have trouble swallowing your medicines. For liquid medicine, use the dosing container that was provided. Household spoons are not accurate. How should I organize my medicines?  Know your medicines Know what each of your medicines looks like. This includes size, color, and shape. Tell your health care provider if you are having trouble recognizing all the medicines that you are taking. If you cannot tell your medicines apart because they look similar, keep them in the original bottles. If you cannot read the labels on the bottles, tell your pharmacist to put your medicines in containers with large print. Review your medicines and your schedule  with family members, a friend, or a caregiver. Use a pill organizer Use a tool to organize your medicine schedule. Tools include a weekly pillbox, a written chart, a notebook, or a calendar. Your tool should help you remember the following things about each medicine: The name of the medicine. The amount (dose) to take. The schedule. This is the day and time the medicine should be taken. The appearance. This includes color, shape, size, and stamp. How to take your medicines. This includes instructions to take them with food, without food, with fluids, or with other medicines. Create reminders for taking your medicines. Use sticky  notes, or use alarms on your watch, mobile device, or phone calendar. You may choose to use a more advanced management system. These systems have storage, alarms, and visual and audio prompts. Some medicines can be taken on an as-needed basis. These may include medicines for nausea, constipation, pain, cough and cold, allergies, and anxiety. If you take an as-needed medicine, write down the name and dose, as well as the date and time that you took it. How should I plan for travel? Take your pillbox, medicines, and organization system with you when traveling. Have your medicines refilled before you travel. This will ensure that you do not run out of your medicines while you are away from home. Always carry an updated list of your medicines with you. If there is an emergency, a first responder can quickly see what medicines you are taking. Do not pack your medicines in checked luggage in case your luggage is lost or delayed. Keep your medicines in your carry-on bag. If any of your medicines is considered a controlled substance, make sure you bring a letter from your health care provider with you. How should I store and discard my medicines? For safe storage: Store medicines in a cool, dry area away from light, or as directed by your health care provider. Do not store medicines in the bathroom. Heat and humidity will affect them. Do not store your medicines with other chemicals or with medicines for pets or other household members. Keep medicines away from children and pets. Do not leave them on counters or bedside tables. Store them in high cabinets or on high shelves. For safe disposal: Check expiration dates regularly. Do not take expired medicines. Discard medicines that are older than the expiration date. Learn a safe way to dispose of your medicines. You may: Use a local government, hospital, or pharmacy medicine-take-back program. If you cannot return the medicine, check the label or package  insert to see if the medicine should be thrown out in the garbage or flushed down the toilet. If you are not sure, ask your health care team. If it is safe to put the medicine in the trash, empty the medicine out of the container. Mix the medicine with cat litter, dirt, coffee grounds, or another unwanted substance. Seal the mixture in a bag or container. Put it in the trash. What should I remember? Tell your health care provider if you: Experience side effects. Have new symptoms. Feel that your medicine is no longer working. Have other concerns about taking your medicines. Review your medicines regularly with your health care provider. Other medicines, diet, medical conditions, weight changes, and daily habits can all affect how medicines work. Ask if you need to continue taking each medicine, and discuss how well each one is working. Refill your medicines early to avoid running out of them. In case of an accidental overdose, call your  local poison control center at 1-228-001-3002 or go to your local emergency department right away. Summary Taking your medicines correctly is an important part of managing or preventing medical problems. You need to make sure that you understand what you are taking a medicine for, as well as how and when you need to take it. Use a tool to organize your medicine schedule. Tools include a weekly pillbox, a written chart, a notebook, or a calendar. In case of an accidental overdose, call your local Poison Control Center at 4085292679 or go to your local emergency department right away. This information is not intended to replace advice given to you by your health care provider. Make sure you discuss any questions you have with your health care provider. Document Revised: 01/05/2021 Document Reviewed: 01/05/2021 Elsevier Patient Education  2024 [employer].

## 2024-02-12 ENCOUNTER — Other Ambulatory Visit: Payer: Self-pay

## 2024-02-13 ENCOUNTER — Ambulatory Visit (HOSPITAL_COMMUNITY): Admitting: Nurse Practitioner

## 2024-02-14 DIAGNOSIS — Z932 Ileostomy status: Secondary | ICD-10-CM | POA: Insufficient documentation

## 2024-02-15 ENCOUNTER — Telehealth: Payer: Self-pay | Admitting: *Deleted

## 2024-02-17 ENCOUNTER — Other Ambulatory Visit (HOSPITAL_COMMUNITY): Payer: Self-pay

## 2024-02-17 ENCOUNTER — Other Ambulatory Visit: Payer: Self-pay | Admitting: Family

## 2024-02-19 ENCOUNTER — Other Ambulatory Visit (HOSPITAL_COMMUNITY): Payer: Self-pay

## 2024-02-19 NOTE — Progress Notes (Signed)
 Spoke to patient and stated yes he was made aware and since then took medication as prescribed doing better and will be seeing you soon.

## 2024-02-20 ENCOUNTER — Ambulatory Visit (HOSPITAL_COMMUNITY): Admitting: Nurse Practitioner

## 2024-02-20 ENCOUNTER — Other Ambulatory Visit (HOSPITAL_COMMUNITY): Payer: Self-pay

## 2024-02-20 ENCOUNTER — Telehealth: Payer: Self-pay | Admitting: *Deleted

## 2024-02-20 NOTE — Patient Outreach (Addendum)
 Voicemail message received from [place], Futures trader at [employer] on 02/09/24  to confirm that patient did return their call regarding an assessment for personal care services and patient has declined. Patient states that he now has a roommate that is assisting with his in home care needs.   Luis Bradsher, LCSW [place]  [facility], [facility] Licensed Clinical Social Worker  Direct Dial: [phone]

## 2024-02-21 ENCOUNTER — Other Ambulatory Visit: Payer: Self-pay

## 2024-02-22 ENCOUNTER — Other Ambulatory Visit (HOSPITAL_COMMUNITY): Payer: Self-pay

## 2024-02-23 ENCOUNTER — Other Ambulatory Visit: Payer: Self-pay | Admitting: Nurse Practitioner

## 2024-02-23 ENCOUNTER — Other Ambulatory Visit (HOSPITAL_COMMUNITY): Payer: Self-pay

## 2024-02-23 DIAGNOSIS — Z419 Encounter for procedure for purposes other than remedying health state, unspecified: Secondary | ICD-10-CM | POA: Diagnosis not present

## 2024-02-23 DIAGNOSIS — G4700 Insomnia, unspecified: Secondary | ICD-10-CM

## 2024-02-23 DIAGNOSIS — E1169 Type 2 diabetes mellitus with other specified complication: Secondary | ICD-10-CM

## 2024-02-23 DIAGNOSIS — J449 Chronic obstructive pulmonary disease, unspecified: Secondary | ICD-10-CM

## 2024-02-23 DIAGNOSIS — I5042 Chronic combined systolic (congestive) and diastolic (congestive) heart failure: Secondary | ICD-10-CM

## 2024-02-23 MED ORDER — DAPAGLIFLOZIN PROPANEDIOL 10 MG PO TABS
10.0000 mg | ORAL_TABLET | Freq: Every day | ORAL | 3 refills | Status: AC
  Filled 2024-02-23 – 2024-03-25 (×2): qty 90, 90d supply, fill #0
  Filled 2024-06-27: qty 90, 90d supply, fill #1

## 2024-02-23 MED ORDER — GABAPENTIN 300 MG PO CAPS
600.0000 mg | ORAL_CAPSULE | Freq: Two times a day (BID) | ORAL | 3 refills | Status: DC
  Filled 2024-02-23 – 2024-04-02 (×2): qty 360, 90d supply, fill #0

## 2024-02-23 MED ORDER — METFORMIN HCL 500 MG PO TABS
ORAL_TABLET | ORAL | 3 refills | Status: DC
  Filled 2024-02-23: qty 270, 90d supply, fill #0

## 2024-02-23 MED ORDER — TIOTROPIUM BROMIDE-OLODATEROL 2.5-2.5 MCG/ACT IN AERS
2.0000 | INHALATION_SPRAY | Freq: Every day | RESPIRATORY_TRACT | 3 refills | Status: DC
  Filled 2024-02-23: qty 4, 30d supply, fill #0

## 2024-02-23 MED ORDER — TORSEMIDE 20 MG PO TABS
80.0000 mg | ORAL_TABLET | Freq: Every day | ORAL | 3 refills | Status: DC
  Filled 2024-02-23 – 2024-03-06 (×2): qty 306, 76d supply, fill #0

## 2024-02-23 MED ORDER — ALLOPURINOL 100 MG PO TABS
100.0000 mg | ORAL_TABLET | Freq: Every day | ORAL | 3 refills | Status: AC
  Filled 2024-02-23: qty 90, 90d supply, fill #0
  Filled 2024-05-31: qty 90, 90d supply, fill #1

## 2024-02-23 MED ORDER — LOSARTAN POTASSIUM 25 MG PO TABS
25.0000 mg | ORAL_TABLET | Freq: Every day | ORAL | 3 refills | Status: AC
  Filled 2024-02-23: qty 90, 90d supply, fill #0
  Filled 2024-05-31: qty 90, 90d supply, fill #1

## 2024-02-23 MED ORDER — ATORVASTATIN CALCIUM 10 MG PO TABS
10.0000 mg | ORAL_TABLET | Freq: Every day | ORAL | 3 refills | Status: AC
  Filled 2024-02-23 – 2024-06-14 (×2): qty 90, 90d supply, fill #0

## 2024-02-23 MED ORDER — CETIRIZINE HCL 10 MG PO TABS
10.0000 mg | ORAL_TABLET | Freq: Every day | ORAL | 3 refills | Status: DC
  Filled 2024-02-23 – 2024-03-11 (×2): qty 90, 90d supply, fill #0

## 2024-02-25 ENCOUNTER — Other Ambulatory Visit: Payer: Self-pay | Admitting: Family

## 2024-02-26 ENCOUNTER — Other Ambulatory Visit: Payer: Self-pay

## 2024-02-26 ENCOUNTER — Other Ambulatory Visit (HOSPITAL_BASED_OUTPATIENT_CLINIC_OR_DEPARTMENT_OTHER): Payer: Self-pay

## 2024-02-26 ENCOUNTER — Other Ambulatory Visit (HOSPITAL_COMMUNITY): Payer: Self-pay

## 2024-02-28 ENCOUNTER — Other Ambulatory Visit (HOSPITAL_COMMUNITY): Payer: Self-pay

## 2024-02-28 ENCOUNTER — Other Ambulatory Visit: Payer: Self-pay | Admitting: Family

## 2024-02-29 DIAGNOSIS — K469 Unspecified abdominal hernia without obstruction or gangrene: Secondary | ICD-10-CM | POA: Diagnosis not present

## 2024-02-29 DIAGNOSIS — T8189XA Other complications of procedures, not elsewhere classified, initial encounter: Secondary | ICD-10-CM | POA: Diagnosis not present

## 2024-03-01 ENCOUNTER — Other Ambulatory Visit (HOSPITAL_COMMUNITY): Payer: Self-pay

## 2024-03-04 ENCOUNTER — Other Ambulatory Visit: Payer: Self-pay | Admitting: Nurse Practitioner

## 2024-03-04 ENCOUNTER — Other Ambulatory Visit (HOSPITAL_COMMUNITY): Payer: Self-pay

## 2024-03-04 ENCOUNTER — Other Ambulatory Visit: Payer: Self-pay | Admitting: Family

## 2024-03-04 ENCOUNTER — Other Ambulatory Visit (HOSPITAL_BASED_OUTPATIENT_CLINIC_OR_DEPARTMENT_OTHER): Payer: Self-pay

## 2024-03-05 ENCOUNTER — Other Ambulatory Visit (HOSPITAL_COMMUNITY): Payer: Self-pay

## 2024-03-05 MED ORDER — FOLIC ACID 1 MG PO TABS
1.0000 mg | ORAL_TABLET | Freq: Every day | ORAL | 0 refills | Status: DC
  Filled 2024-03-05: qty 90, 90d supply, fill #0

## 2024-03-06 ENCOUNTER — Other Ambulatory Visit (HOSPITAL_COMMUNITY): Payer: Self-pay

## 2024-03-06 ENCOUNTER — Telehealth: Payer: Self-pay

## 2024-03-06 ENCOUNTER — Other Ambulatory Visit: Payer: Self-pay

## 2024-03-06 MED FILL — Triamcinolone Acetonide Cream 0.5%: CUTANEOUS | 14 days supply | Qty: 30 | Fill #1 | Status: AC

## 2024-03-06 NOTE — Patient Instructions (Signed)
 [name] - I am sorry I was unable to reach you today for our scheduled appointment. I work with Elnor [name], NP and am calling to support your healthcare needs. Please contact me at [phone] at your earliest convenience. I look forward to speaking with you soon.   Thank you,   Heddy Shutter, RN, MSN, BSN, CCM [place]  [facility] Population Health Case Manager Phone: [phone]

## 2024-03-07 ENCOUNTER — Ambulatory Visit (INDEPENDENT_AMBULATORY_CARE_PROVIDER_SITE_OTHER): Admitting: Podiatry

## 2024-03-07 VITALS — Ht 73.0 in | Wt 224.0 lb

## 2024-03-07 DIAGNOSIS — E1142 Type 2 diabetes mellitus with diabetic polyneuropathy: Secondary | ICD-10-CM | POA: Diagnosis not present

## 2024-03-07 DIAGNOSIS — M10072 Idiopathic gout, left ankle and foot: Secondary | ICD-10-CM

## 2024-03-07 DIAGNOSIS — M79674 Pain in right toe(s): Secondary | ICD-10-CM

## 2024-03-07 DIAGNOSIS — B351 Tinea unguium: Secondary | ICD-10-CM | POA: Diagnosis not present

## 2024-03-07 DIAGNOSIS — Z7689 Persons encountering health services in other specified circumstances: Secondary | ICD-10-CM | POA: Diagnosis not present

## 2024-03-07 DIAGNOSIS — M79675 Pain in left toe(s): Secondary | ICD-10-CM

## 2024-03-07 NOTE — Patient Instructions (Signed)
 Get blood work done in about 4 weeks

## 2024-03-07 NOTE — Progress Notes (Signed)
 Subjective: Chief Complaint  Patient presents with   Foot Pain    Rm 12 Patient is here to f/u on bilateral foot pain. Pt states feet have been swollen for the past three days. Patient is requesting nail trimming.    63 year old male presents the office today for concerns of foot pain.  States he is still taking allopurinol .  He feels that the gout symptoms have improved.    His main concern today is he needs to have the nails trimmed as they are thickened elongated and and causing discomfort.   Objective: AAO x3, NAD DP/PT pulses palpable bilaterally, CRT less than 3 seconds Nails are hypertrophic, dystrophic, brittle, discolored, elongated 10. No surrounding redness or drainage. Tenderness nails 1-5 bilaterally. No open lesions or pre-ulcerative lesions are identified today. Chronic edema present to bilateral lower extremities and there is pitting edema present there is no erythema or warmth or any open lesions.  There is no area of pinpoint tenderness.  No pain with calf compression, swelling, warmth, erythema  Assessment: Gout: Symptomatic onychosis  Plan: Gout - Continue allopurinol .  Will recheck uric acid level in 1 month an order was given for him to have this done.  Symptomatic onychomycosis - Sharply debrided nails x 10 without any complications or bleeding  Return in about 3 months (around 06/06/2024) for nail trim.  Luis [name] DPM

## 2024-03-08 ENCOUNTER — Ambulatory Visit

## 2024-03-08 ENCOUNTER — Ambulatory Visit (HOSPITAL_COMMUNITY): Admitting: Nurse Practitioner

## 2024-03-08 ENCOUNTER — Other Ambulatory Visit (HOSPITAL_COMMUNITY): Payer: Self-pay

## 2024-03-08 ENCOUNTER — Ambulatory Visit: Admitting: Nurse Practitioner

## 2024-03-08 ENCOUNTER — Other Ambulatory Visit: Payer: Self-pay

## 2024-03-08 ENCOUNTER — Ambulatory Visit: Payer: Self-pay | Admitting: Nurse Practitioner

## 2024-03-08 VITALS — BP 138/82 | HR 94 | Temp 97.8°F | Ht 73.0 in | Wt 246.1 lb

## 2024-03-08 DIAGNOSIS — I700 Atherosclerosis of aorta: Secondary | ICD-10-CM | POA: Diagnosis not present

## 2024-03-08 DIAGNOSIS — E1169 Type 2 diabetes mellitus with other specified complication: Secondary | ICD-10-CM | POA: Insufficient documentation

## 2024-03-08 DIAGNOSIS — Z7984 Long term (current) use of oral hypoglycemic drugs: Secondary | ICD-10-CM | POA: Diagnosis not present

## 2024-03-08 DIAGNOSIS — I5033 Acute on chronic diastolic (congestive) heart failure: Secondary | ICD-10-CM

## 2024-03-08 DIAGNOSIS — I5042 Chronic combined systolic (congestive) and diastolic (congestive) heart failure: Secondary | ICD-10-CM

## 2024-03-08 DIAGNOSIS — Z23 Encounter for immunization: Secondary | ICD-10-CM

## 2024-03-08 DIAGNOSIS — I517 Cardiomegaly: Secondary | ICD-10-CM | POA: Diagnosis not present

## 2024-03-08 DIAGNOSIS — E1142 Type 2 diabetes mellitus with diabetic polyneuropathy: Secondary | ICD-10-CM | POA: Diagnosis not present

## 2024-03-08 DIAGNOSIS — R0602 Shortness of breath: Secondary | ICD-10-CM | POA: Diagnosis not present

## 2024-03-08 LAB — COMPREHENSIVE METABOLIC PANEL WITH GFR
ALT: 52 U/L (ref 0–53)
AST: 55 U/L — ABNORMAL HIGH (ref 0–37)
Albumin: 4.3 g/dL (ref 3.5–5.2)
Alkaline Phosphatase: 169 U/L — ABNORMAL HIGH (ref 39–117)
BUN: 25 mg/dL — ABNORMAL HIGH (ref 6–23)
CO2: 35 meq/L — ABNORMAL HIGH (ref 19–32)
Calcium: 9.8 mg/dL (ref 8.4–10.5)
Chloride: 92 meq/L — ABNORMAL LOW (ref 96–112)
Creatinine, Ser: 1.32 mg/dL (ref 0.40–1.50)
GFR: 56.97 mL/min — ABNORMAL LOW (ref >60.00)
Glucose, Bld: 181 mg/dL — ABNORMAL HIGH (ref 70–99)
Potassium: 4.4 meq/L (ref 3.5–5.1)
Sodium: 136 meq/L (ref 135–145)
Total Bilirubin: 0.9 mg/dL (ref 0.2–1.2)
Total Protein: 7.9 g/dL (ref 6.0–8.3)

## 2024-03-08 LAB — CBC WITH DIFFERENTIAL/PLATELET
Basophils Absolute: 0.1 K/uL (ref 0.0–0.1)
Basophils Relative: 0.8 % (ref 0.0–3.0)
Eosinophils Absolute: 0.1 K/uL (ref 0.0–0.7)
Eosinophils Relative: 1 % (ref 0.0–5.0)
HCT: 39.3 % (ref 39.0–52.0)
Hemoglobin: 13.2 g/dL (ref 13.0–17.0)
Lymphocytes Relative: 14.8 % (ref 12.0–46.0)
Lymphs Abs: 1.3 K/uL (ref 0.7–4.0)
MCHC: 33.5 g/dL (ref 30.0–36.0)
MCV: 104.5 fl — ABNORMAL HIGH (ref 78.0–100.0)
Monocytes Absolute: 1.2 K/uL — ABNORMAL HIGH (ref 0.1–1.0)
Monocytes Relative: 13.4 % — ABNORMAL HIGH (ref 3.0–12.0)
Neutro Abs: 6.4 K/uL (ref 1.4–7.7)
Neutrophils Relative %: 70 % (ref 43.0–77.0)
Platelets: 232 K/uL (ref 150.0–400.0)
RBC: 3.76 Mil/uL — ABNORMAL LOW (ref 4.22–5.81)
RDW: 17.5 % — ABNORMAL HIGH (ref 11.5–15.5)
WBC: 9.1 K/uL (ref 4.0–10.5)

## 2024-03-08 LAB — BRAIN NATRIURETIC PEPTIDE: Pro B Natriuretic peptide (BNP): 163 pg/mL — ABNORMAL HIGH (ref 0.0–100.0)

## 2024-03-08 LAB — HEMOGLOBIN A1C: Hgb A1c MFr Bld: 8.4 % — ABNORMAL HIGH (ref 4.6–6.5)

## 2024-03-08 MED ORDER — METFORMIN HCL 1000 MG PO TABS
1000.0000 mg | ORAL_TABLET | Freq: Two times a day (BID) | ORAL | 3 refills | Status: DC
  Filled 2024-03-08: qty 60, 30d supply, fill #0

## 2024-03-08 NOTE — Assessment & Plan Note (Signed)
 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 Current torsemide  dose is insufficient. - I consulted with supervising physician and will plan on increasing torsemide  to 80 mg in the morning and 40 mg in the afternoon - First collect stat metabolic panel to determine kidney function - Check stat x-ray as well to rule out pneumonia as potential cause for shortness of breath - Referred to heart failure clinic for assistance with chronic management - Initiate antibiotics if chest x-ray indicates pneumonia.

## 2024-03-08 NOTE — Patient Instructions (Addendum)
 Consider getting the covid shot

## 2024-03-08 NOTE — Assessment & Plan Note (Signed)
 Type 2 diabetes mellitus with diabetic polyneuropathy Inconsistent adherence to evening metformin  dose.  - Order  A1c tests. - Adjust metformin  to 1000 mg in the morning and 1000 mg in the evening if blood sugar remains high. - Send refill for metformin  as 1000 mg tablets.

## 2024-03-08 NOTE — Progress Notes (Signed)
 Established Patient Office Visit  Subjective   Patient ID: Luis [name], male    DOB: [birth year]  Age: 63 y.o. MRN: [id number]  Chief Complaint  Patient presents with   Congestive Heart Failure    Discussed the use of AI scribe software for clinical note transcription with the patient, who gave verbal consent to proceed.  History of Present Illness Luis [name] is a 63 year old male with diabetes who presents with leg swelling and concerns about fluid retention.  Patient has combined systolic and diastolic heart failure.  Reports over the last 2 to 3 weeks has been experiencing bilateral leg and foot swelling - Increased weight, particularly in the lower extremities - Recent five-pound weight loss overnight after taking additional  40mg  of torsemide  - Ran out of torsemide  for four days, preceding current weight gain - Episodes of shortness of breath associated with fluid retention - Currently treated with Farxiga  10 mg daily, losartan  25 mg daily, metoprolol  25 mg daily, torsemide  80 mg daily   T2DM with neuropathy - Diabetes managed with metformin  1000 mg Qam and 500mg  Q pm and Farxiga  10mg /day - Occasional missed evening doses of metformin  - Inconsistently monitors blood sugars at home        ROS: see HPI    Objective:     BP 138/82   Pulse 94   Temp 97.8 F (36.6 C) (Temporal)   Ht 6' 1 (1.854 m)   Wt 246 lb 2 oz (111.6 kg)   SpO2 95%   BMI 32.47 kg/m  BP Readings from Last 3 Encounters:  03/08/24 138/82  02/06/24 (!) 166/95  01/30/24 (!) 154/82   Wt Readings from Last 3 Encounters:  03/08/24 246 lb 2 oz (111.6 kg)  03/07/24 224 lb (101.6 kg)  02/08/24 224 lb (101.6 kg)      Physical Exam Vitals reviewed.  Constitutional:      Appearance: Normal appearance.  HENT:     Head: Normocephalic and atraumatic.  Cardiovascular:     Rate and Rhythm: Normal rate and regular rhythm.  Pulmonary:     Effort: Pulmonary effort is normal. No respiratory  distress.     Breath sounds: No wheezing or rales.  Musculoskeletal:     Cervical back: Neck supple.     Right lower leg: Edema present.     Left lower leg: Edema present.  Skin:    General: Skin is warm and dry.  Neurological:     Mental Status: He is alert and oriented to person, place, and time.  Psychiatric:        Mood and Affect: Mood normal.        Behavior: Behavior normal.        Thought Content: Thought content normal.        Judgment: Judgment normal.      No results found for any visits on 03/08/24.    The ASCVD Risk score (Arnett DK, et al., 2019) failed to calculate for the following reasons:   The valid total cholesterol range is 130 to 320 mg/dL    Assessment & Plan:   Problem List Items Addressed This Visit       Cardiovascular and Mediastinum   Acute on chronic heart failure with preserved ejection fraction (HFpEF) (HCC) - Primary   Chronic combined systolic and diastolic heart failure with acute volume overload Acute volume overload with leg and foot swelling, weight gain, and shortness of breath indicates fluid retention. Current torsemide  dose is insufficient. -  I consulted with supervising physician and will plan on increasing torsemide  to 80 mg in the morning and 40 mg in the afternoon - First collect stat metabolic panel to determine kidney function - Check stat x-ray as well to rule out pneumonia as potential cause for shortness of breath - Referred to heart failure clinic for assistance with chronic management - Initiate antibiotics if chest x-ray indicates pneumonia.      Relevant Orders   DG Chest 2 View   Comprehensive metabolic panel with GFR   B Nat Peptide   CBC with Differential/Platelet   Chronic combined systolic and diastolic heart failure (HCC)   Chronic combined systolic and diastolic heart failure with acute volume overload Acute volume overload with leg and foot swelling, weight gain, and shortness of breath indicates fluid  retention. Current torsemide  dose is insufficient. - I consulted with supervising physician and will plan on increasing torsemide  to 80 mg in the morning and 40 mg in the afternoon - First collect stat metabolic panel to determine kidney function - Check stat x-ray as well to rule out pneumonia as potential cause for shortness of breath - Referred to heart failure clinic for assistance with chronic management - Initiate antibiotics if chest x-ray indicates pneumonia.      Relevant Orders   AMB referral to CHF clinic     Endocrine   Type 2 diabetes mellitus with diabetic polyneuropathy, without long-term current use of insulin  (HCC)   Type 2 diabetes mellitus with diabetic polyneuropathy Inconsistent adherence to evening metformin  dose.  - Order  A1c tests. - Adjust metformin  to 1000 mg in the morning and 1000 mg in the evening if blood sugar remains high. - Send refill for metformin  as 1000 mg tablets.      Relevant Medications   metFORMIN  (GLUCOPHAGE ) 1000 MG tablet   Other Relevant Orders   Hemoglobin A1c     Other   Need for vaccination   Relevant Orders   Flu vaccine trivalent PF, 6mos and older(Flulaval,Afluria,Fluarix,Fluzone) (Completed)   Assessment and Plan Assessment & Plan Chronic combined systolic and diastolic heart failure with acute volume overload Acute volume overload with leg and foot swelling, weight gain, and shortness of breath indicates fluid retention. Current torsemide  dose is insufficient. - I consulted with supervising physician and will plan on increasing torsemide  to 80 mg in the morning and 40 mg in the afternoon - First collect stat metabolic panel to determine kidney function - Check stat x-ray as well to rule out pneumonia as potential cause for shortness of breath - Referred to heart failure clinic for assistance with chronic management - Initiate antibiotics if chest x-ray indicates pneumonia.  Type 2 diabetes mellitus with diabetic  polyneuropathy Inconsistent adherence to evening metformin  dose.  - Order  A1c tests. - Adjust metformin  to 1000 mg in the morning and 1000 mg in the evening if blood sugar remains high. - Send refill for metformin  as 1000 mg tablets.  Gout, recently treated Recent gout flare with high uric acid levels (14.5%). Currently on allopurinol  to manage uric acid levels.  Goals of Care Discussion of vaccination strategy given chronic conditions and risk for severe illness. - Administer flu shot today. - Encourage COVID vaccination at pharmacy in a couple of weeks. - Discussed spacing out vaccines to manage side effects.  Follow-Up - Stay by phone for results of chest x-ray and kidney function tests. - Discuss next steps based on test results.  I personally spent a total of  48 minutes in the care of the patient today including preparing to see the patient, getting/reviewing separately obtained history, performing a medically appropriate exam/evaluation, counseling and educating, placing orders, referring and communicating with other health care professionals, documenting clinical information in the EHR, and coordinating care.   Return in about 1 month (around 04/07/2024) for F/U with Pearla Mckinny.    Lauraine FORBES Pereyra, NP

## 2024-03-09 ENCOUNTER — Other Ambulatory Visit: Payer: Self-pay

## 2024-03-11 ENCOUNTER — Other Ambulatory Visit (HOSPITAL_COMMUNITY): Payer: Self-pay

## 2024-03-11 ENCOUNTER — Other Ambulatory Visit: Payer: Self-pay

## 2024-03-11 NOTE — Telephone Encounter (Signed)
 Copied from CRM [id number]. Topic: Clinical - Lab/Test Results >> Mar 11, 2024  2:29 PM Rea ORN wrote: Reason for CRM: pt called to advise he missed the call from the nurse on Friday regarding results and could not understand his voicemail. Pt was read 9/26 encounter. Since pt didn't understand the voicemail, he was took Torsemide  80 mg in the am and 80 in the pm for the past 3 days and stated that has been taking fluid off.  Pt was also advised to take Metformin  twice a day with food. Lastly, pt was advised there was no pneumonia.

## 2024-03-12 ENCOUNTER — Other Ambulatory Visit: Payer: Self-pay

## 2024-03-12 ENCOUNTER — Telehealth: Payer: Self-pay | Admitting: Radiology

## 2024-03-12 NOTE — Telephone Encounter (Signed)
 Copied from CRM #[id number]. Topic: Clinical - Lab/Test Results >> Mar 12, 2024 12:53 PM Shereese L wrote: Reason for CRM: Patient called in and stated that he would like a call back to go over his lab results

## 2024-03-14 NOTE — Telephone Encounter (Signed)
 Pt was made aware of his lab results in different encounter.

## 2024-03-14 NOTE — Telephone Encounter (Signed)
 Error

## 2024-03-18 ENCOUNTER — Ambulatory Visit (INDEPENDENT_AMBULATORY_CARE_PROVIDER_SITE_OTHER): Admitting: Family Medicine

## 2024-03-18 ENCOUNTER — Other Ambulatory Visit (HOSPITAL_COMMUNITY): Payer: Self-pay

## 2024-03-18 ENCOUNTER — Telehealth (HOSPITAL_COMMUNITY): Payer: Self-pay

## 2024-03-18 ENCOUNTER — Ambulatory Visit: Payer: Self-pay

## 2024-03-18 ENCOUNTER — Encounter: Payer: Self-pay | Admitting: Family Medicine

## 2024-03-18 ENCOUNTER — Other Ambulatory Visit (HOSPITAL_BASED_OUTPATIENT_CLINIC_OR_DEPARTMENT_OTHER): Payer: Self-pay

## 2024-03-18 VITALS — BP 138/78 | HR 74 | Temp 97.6°F | Ht 73.0 in | Wt 232.6 lb

## 2024-03-18 DIAGNOSIS — L24B1 Irritant contact dermatitis related to digestive stoma or fistula: Secondary | ICD-10-CM | POA: Diagnosis not present

## 2024-03-18 DIAGNOSIS — F1020 Alcohol dependence, uncomplicated: Secondary | ICD-10-CM | POA: Diagnosis not present

## 2024-03-18 DIAGNOSIS — E1142 Type 2 diabetes mellitus with diabetic polyneuropathy: Secondary | ICD-10-CM | POA: Diagnosis not present

## 2024-03-18 DIAGNOSIS — Z932 Ileostomy status: Secondary | ICD-10-CM

## 2024-03-18 DIAGNOSIS — F17200 Nicotine dependence, unspecified, uncomplicated: Secondary | ICD-10-CM

## 2024-03-18 LAB — POCT GLUCOSE (DEVICE FOR HOME USE): POC Glucose: 305 mg/dL — AB (ref 70–99)

## 2024-03-18 MED ORDER — TIRZEPATIDE 2.5 MG/0.5ML ~~LOC~~ SOAJ
2.5000 mg | SUBCUTANEOUS | 1 refills | Status: DC
  Filled 2024-03-18 – 2024-03-25 (×3): qty 2, 28d supply, fill #0

## 2024-03-18 MED ORDER — DEXCOM G7 RECEIVER DEVI
1.0000 | 0 refills | Status: AC
  Filled 2024-03-18 – 2024-03-21 (×2): qty 1, 90d supply, fill #0
  Filled 2024-03-25: qty 1, 30d supply, fill #0
  Filled 2024-05-13: qty 1, 90d supply, fill #0

## 2024-03-18 MED ORDER — DEXCOM G7 SENSOR MISC
1.0000 | 3 refills | Status: DC
  Filled 2024-03-18 – 2024-03-21 (×2): qty 3, 30d supply, fill #0
  Filled 2024-03-25: qty 3, 90d supply, fill #0
  Filled 2024-04-24 – 2024-06-10 (×2): qty 3, 30d supply, fill #0

## 2024-03-18 NOTE — Progress Notes (Signed)
 Remote PPM Transmission

## 2024-03-18 NOTE — Telephone Encounter (Signed)
 FYI Only or Action Required?: Action required by provider: request for appointment.  Patient was last seen in primary care on 03/08/2024 by Elnor [name], NP.  Called Nurse Triage reporting Blood Sugar Problem.  Symptoms began yesterday.  Interventions attempted: Prescription medications: metformin .  Symptoms are: unchanged.  Triage Disposition: See Physician Within 24 Hours  Patient/caregiver understands and will follow disposition?: YesCopied from CRM #[id number]. Topic: Clinical - Red Word Triage >> Mar 18, 2024  1:26 PM Rosina BIRCH wrote: Red Word that prompted transfer to Nurse Triage: patient called stating his blood sugar has been high around this time of day. It was 649 yesterday and today it is 641 Reason for Disposition  [1] Symptoms of high blood sugar (e.g., increased thirst, frequent urination, weight loss) AND [2] not able to test blood glucose  Answer Assessment - Initial Assessment Questions Yesterday and just now: BS 269. Pt is taking extra 1000mg  Metformin  at night since last visit several days ago. I don't check my sugar regularly. I gave up drinking last friday.  I have so many problems but I feel great at the moment. I just tire quickly.     1. BLOOD GLUCOSE: What is your blood glucose level?  269 2. ONSET: When did you check the blood glucose?     today 3. USUAL RANGE: What is your glucose level usually? (e.g., usual fasting morning value, usual evening value)     Not sure 4. KETONES: Do you check for ketones (urine or blood test strips)? If Yes, ask: What does the test show now?      na 5. TYPE 1 or 2:  Do you know what type of diabetes you have?  (e.g., Type 1, Type 2, Gestational; doesn't know)      Type 2 6. INSULIN : Do you take insulin ? What type of insulin (s) do you use? What is the mode of delivery? (syringe, pen; injection or pump)?      no 7. DIABETES PILLS: Do you take any pills for your diabetes? If Yes, ask: Have you missed taking  any pills recently?     Metformin   8. OTHER SYMPTOMS: Do you have any symptoms? (e.g., fever, frequent urination, difficulty breathing, dizziness, weakness, vomiting)     Sleepy, frequent urination, n/v  Protocols used: Diabetes - High Blood Sugar-A-AH

## 2024-03-18 NOTE — Progress Notes (Signed)
 Acute Office Visit  Subjective:     Patient ID: Luis [name], male    DOB: February 20, 1961, 63 y.o.   MRN: [id number]  No chief complaint on file.   HPI  Discussed the use of AI scribe software for clinical note transcription with the patient, who gave verbal consent to proceed.  History of Present Illness Luis [name] is a 63 year old male with diabetes who presents with elevated blood sugar levels. Accompanied by his son today.  Hyperglycemia - Elevated blood glucose levels up to 300 mg/dL, with recent readings of 269 mg/dL and 738 mg/dL - Hemoglobin J8r increased from 7.3% to 8.4% over the past three months - Currently taking metformin  1000 mg twice daily, with a recent dosage increase - No current alcohol consumption; stopped a couple of weeks ago  Fatigue and somnolence - Frequent sleepiness and low energy levels - Fatigue impacts ability to perform daily activities, including washing dishes  Abdominal protrusion and wound - Large abdominal protrusion present - Chronic abdominal wound since an operation over five years ago, managed with zero form and abdominal pads - Concern regarding abdominal appearance and weight, questioning etiology (fat, hernia, or other causes)  Back pain - Back pain present, contributing to fatigue and limiting physical activity  Constipation and bowel dysfunction - Constipation and difficulty with bowel movements - Requires water to facilitate defecation - Attributes bowel changes to dietary modifications and cessation of alcohol use     ROS Per HPI      Objective:    BP 138/78 (BP Location: Left Arm, Patient Position: Sitting)   Pulse 74   Temp 97.6 F (36.4 C) (Temporal)   Ht 6' 1 (1.854 m)   Wt 232 lb 9.6 oz (105.5 kg)   SpO2 93%   BMI 30.69 kg/m    Physical Exam Vitals and nursing note reviewed.  Constitutional:      General: He is not in acute distress.    Appearance: He is obese.     Comments: Chronically  ill-appearing  HENT:     Head: Normocephalic and atraumatic.     Right Ear: External ear normal.     Left Ear: External ear normal.     Nose: Nose normal.     Mouth/Throat:     Mouth: Mucous membranes are moist.     Pharynx: Oropharynx is clear.  Eyes:     Extraocular Movements: Extraocular movements intact.  Cardiovascular:     Rate and Rhythm: Normal rate and regular rhythm.     Pulses: Normal pulses.     Heart sounds: Normal heart sounds.  Pulmonary:     Effort: Pulmonary effort is normal. No respiratory distress.     Breath sounds: Normal breath sounds. No wheezing, rhonchi or rales.  Abdominal:     General: The ostomy site is clean.      Comments: Circle area of colostomy, beefy red stoma, bag intact with good output Square: dressing in place  Musculoskeletal:        General: Normal range of motion.     Cervical back: Normal range of motion.     Right lower leg: No edema.     Left lower leg: No edema.  Lymphadenopathy:     Cervical: No cervical adenopathy.  Skin:    General: Skin is warm and dry.  Neurological:     General: No focal deficit present.     Mental Status: He is alert and oriented to person, place, and time.  Psychiatric:        Mood and Affect: Mood normal.        Behavior: Behavior normal.     Results for orders placed or performed in visit on 03/18/24  POCT Glucose (Device for Home Use)  Result Value Ref Range   Glucose Fasting, POC     POC Glucose 305 (A) 70 - 99 mg/dl        Assessment & Plan:   Assessment and Plan Assessment & Plan Type 2 diabetes mellitus with diabetic polyneuropathy, without long-term use of insulin  Poor glycemic control with increased A1c from 7.3% to 8.4%. Recent alcohol cessation may affect glucose levels. Discussed continuous glucose monitoring and injectable medications. Patient prefers Mounjaro over Ozempic due to weight concerns. Explained Mounjaro side effects. Emphasized dietary changes and regular  monitoring. - Order continuous glucose monitor (Dexcom). - Initiate Mounjaro with prior authorization. - Refer to diabetes educator for nutritional counseling.  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 Difficulty with colostomy management. No infection but skin irritation from tape. Surgical repair depends on glycemic control.  Alcoholism - States that he has stopped drinking about a week ago - Does not plan to restart   Smoker - Not ready to quit      Orders Placed This Encounter  Procedures   Amb Referral to Nutrition and Diabetic Education    Referral Priority:   Routine    Referral Type:   Consultation    Referral Reason:   Specialty Services Required    Number of Visits Requested:   1   POCT Glucose (Device for Home Use)     Meds ordered this encounter  Medications   Continuous Glucose Sensor (DEXCOM G7 SENSOR) MISC    Sig: Use as directed to monitor blood glucose, changing sensor every 10 days.    Dispense:  3 each    Refill:  3   Continuous Glucose Receiver (DEXCOM G7 RECEIVER) DEVI    Sig: Use as directed.    Dispense:  1 each    Refill:  0   tirzepatide (MOUNJARO) 2.5 MG/0.5ML Pen    Sig: Inject 2.5 mg into the skin once a week.    Dispense:  2 mL    Refill:  1    Return for About a month with Sarah.  Corean LITTIE Ku, FNP

## 2024-03-18 NOTE — Patient Instructions (Addendum)
 Mounjaro 2.5mg  once weekly, please follow up in the office in 4 weeks.   I have sent in a continuous glucose monitor for you to help control diabetes.   I have sent in a referral to diabetes education. Someone will be reaching out to get you scheduled.   Follow up with me in one month for evaluation of medication effectiveness.

## 2024-03-19 ENCOUNTER — Other Ambulatory Visit (HOSPITAL_COMMUNITY): Payer: Self-pay

## 2024-03-19 ENCOUNTER — Telehealth (HOSPITAL_COMMUNITY): Payer: Self-pay

## 2024-03-19 NOTE — Telephone Encounter (Signed)
 PA request has been Received. New Encounter has been or will be created for follow up. For additional info see Pharmacy Prior Auth telephone encounter from 03/19/24.

## 2024-03-19 NOTE — Telephone Encounter (Signed)
 Pharmacy Patient Advocate Encounter  Received notification from [facility] MEDICAID that Prior Authorization for [facility] G7 receiver device has been DENIED.  Full denial letter will be uploaded to the media tab. See denial reason below.   PA #/Case ID/Reference #: [id number]

## 2024-03-19 NOTE — Telephone Encounter (Signed)
 Pharmacy Patient Advocate Encounter   Received notification from Pt Calls Messages that prior authorization for Dexcom G7 Receiver device  is required/requested.   Insurance verification completed.   The patient is insured through [facility] MEDICAID.   Per test claim: PA required; PA submitted to above mentioned insurance via Latent Key/confirmation #/EOC [facility] Status is pending

## 2024-03-19 NOTE — Telephone Encounter (Signed)
 Pharmacy Patient Advocate Encounter  Received notification from [facility] MEDICAID that Prior Authorization for Mounjaro 2.5mg /0.91ml has been DENIED.  Full denial letter will be uploaded to the media tab. See denial reason below.   PA #/Case ID/Reference #: [id number]

## 2024-03-19 NOTE — Telephone Encounter (Signed)
 Pharmacy Patient Advocate Encounter   Received notification from Pt Calls Messages that prior authorization for Mounjaro 2.5MG /0.5ML auto-injectors  is required/requested.   Insurance verification completed.   The patient is insured through [facility] MEDICAID.   Per test claim: PA required; PA submitted to above mentioned insurance via Latent Key/confirmation #/EOC [id number] Status is pending

## 2024-03-20 ENCOUNTER — Other Ambulatory Visit (HOSPITAL_COMMUNITY): Payer: Self-pay

## 2024-03-20 ENCOUNTER — Other Ambulatory Visit: Payer: Self-pay | Admitting: Nurse Practitioner

## 2024-03-20 DIAGNOSIS — G4700 Insomnia, unspecified: Secondary | ICD-10-CM

## 2024-03-20 MED ORDER — HYDROXYZINE PAMOATE 50 MG PO CAPS
50.0000 mg | ORAL_CAPSULE | Freq: Every evening | ORAL | 2 refills | Status: DC | PRN
  Filled 2024-03-20 – 2024-03-25 (×2): qty 30, 30d supply, fill #0

## 2024-03-21 ENCOUNTER — Other Ambulatory Visit (HOSPITAL_COMMUNITY): Payer: Self-pay

## 2024-03-22 ENCOUNTER — Ambulatory Visit (HOSPITAL_COMMUNITY)
Admission: RE | Admit: 2024-03-22 | Discharge: 2024-03-22 | Disposition: A | Discharge status: Home / Self Care | Source: Ambulatory Visit | Attending: Plastic Surgery | Admitting: Plastic Surgery

## 2024-03-22 DIAGNOSIS — T8189XS Other complications of procedures, not elsewhere classified, sequela: Secondary | ICD-10-CM | POA: Insufficient documentation

## 2024-03-22 DIAGNOSIS — Z432 Encounter for attention to ileostomy: Secondary | ICD-10-CM

## 2024-03-22 DIAGNOSIS — K439 Ventral hernia without obstruction or gangrene: Secondary | ICD-10-CM

## 2024-03-22 NOTE — Progress Notes (Signed)
 [facility]   Reason for visit:  RLQ ileostomy  ventral hernia nonhealing chronic wound to abdomen HPI:  Diverticulitis with perforation  ileostomy Past Medical History:  Diagnosis Date   Acute respiratory failure with hypoxia (HCC) 12/23/2022   Alcohol withdrawal syndrome, with delirium (HCC) 12/27/2022   COPD (chronic obstructive pulmonary disease) (HCC)    Diabetes mellitus without complication (HCC)    Diverticulitis    DVT (deep venous thrombosis) (HCC)    x3   Encephalopathy acute 12/28/2022   ETOH abuse    H/O colectomy    Hyperlipidemia    Hypertension    Sepsis associated hypotension (HCC) 08/03/2019   Smoker    Family History  Problem Relation Age of Onset   COPD Mother    Prostate cancer Father    Colon cancer Neg Hx    Rectal cancer Neg Hx    Stomach cancer Neg Hx    Esophageal cancer Neg Hx    Allergies  Allergen Reactions   Codeine Hives   Lisinopril Other (See Comments)    Other reaction(s): renal effects   Current Outpatient Medications  Medication Sig Dispense Refill Last Dose/Taking   acetaminophen  (ACETAMINOPHEN  8 HOUR) 650 MG CR tablet Take 1 tablet (650 mg total) by mouth every 8 (eight) hours as needed for pain 90 tablet 1    allopurinol  (ZYLOPRIM ) 100 MG tablet Take 1 tablet (100 mg total) by mouth daily. 90 tablet 3    ascorbic acid  (VITAMIN C ) 500 MG tablet Take 1 tablet (500 mg total) by mouth daily. 30 tablet 1    atorvastatin  (LIPITOR) 10 MG tablet Take 1 tablet (10 mg total) by mouth daily. 90 tablet 3    cetirizine  (ZYRTEC ) 10 MG tablet Take 1 tablet (10 mg total) by mouth daily. 90 tablet 3    Continuous Glucose Receiver (DEXCOM G7 RECEIVER) DEVI Use as directed. 1 each 0    Continuous Glucose Sensor (DEXCOM G7 SENSOR) MISC Use as directed to monitor blood glucose, changing sensor every 10 days. 3 each 3    dapagliflozin  propanediol (FARXIGA ) 10 MG TABS tablet Take 1 tablet (10 mg total) by mouth daily before breakfast. 90  tablet 3    fluticasone  (FLOVENT  HFA) 110 MCG/ACT inhaler Inhale 1 puff into the lungs in the morning and at bedtime. 12 g 2    folic acid  (FOLVITE ) 1 MG tablet Take 1 tablet (1 mg total) by mouth daily. 90 tablet 0    gabapentin  (NEURONTIN ) 300 MG capsule Take 2 capsules (600 mg total) by mouth 2 (two) times daily. 360 capsule 3    guaiFENesin  (MUCINEX ) 600 MG 12 hr tablet Take 1 tablet (600 mg total) by mouth 2 (two) times daily as needed to loosen phlegm or cough. 180 tablet 1    hydrOXYzine  (VISTARIL ) 50 MG capsule Take 1 capsule (50 mg total) by mouth at bedtime as needed. 30 capsule 2    ipratropium-albuterol  (DUONEB) 0.5-2.5 (3) MG/3ML SOLN Take 3 mLs by nebulization every 6 (six) hours as needed (wheezing / sob).      losartan  (COZAAR ) 25 MG tablet Take 1 tablet (25 mg total) by mouth daily. 90 tablet 3    metFORMIN  (GLUCOPHAGE ) 1000 MG tablet Take 1 tablet (1,000 mg total) by mouth 2 (two) times daily with a meal. 60 tablet 3    metoprolol  succinate (TOPROL -XL) 25 MG 24 hr tablet Take 1 tablet (25 mg total) by mouth daily. 90 tablet 3    Tiotropium Bromide -Olodaterol  2.5-2.5 MCG/ACT AERS Inhale 2 puffs into the lungs daily. 4 g 3    tirzepatide (MOUNJARO) 2.5 MG/0.5ML Pen Inject 2.5 mg into the skin once a week. 2 mL 1    torsemide  (DEMADEX ) 20 MG tablet Take 4 tablets (80 mg total) by mouth daily. 306 tablet 3    triamcinolone  cream (KENALOG ) 0.5 % Apply topically to legs twice daily for up to 2 weeks as needed for eczema 30 g 1    No current facility-administered medications for this encounter.   ROS  Review of Systems Vital signs:  BP 127/79 (BP Location: Right Arm)   Pulse 62   Temp 97.8 F (36.6 C) (Oral)   Resp 19   SpO2 95%  Exam:  Physical Exam  Stoma type/location:  RLQ ileostomy Stomal assessment/size:  1 flush pink and moist  Peristomal assessment:  Ventral hernia, nonhealing abdominal wound  Treatment options for stomal/peristomal skin: barrier ring and 1 piece  flat pouch  Output: liquid yellow stool  Ostomy pouching: 1pc.  Education provided:  performed wound care to abdomen, aquacel silver  hydrofiber to openwounds. Cover with ABD pads and tape.     Impression/dx  Ileostomy Ventral hernia  Nonhealing wound Discussion  Continue ostomy and wound care  See back as needed.  Plan  Update supply orders as needed.     Visit time: 50 minutes.   Darice Cooley FNP-BC

## 2024-03-22 NOTE — Discharge Instructions (Signed)
 [facility] surgeon  [facility] [phone]

## 2024-03-24 DIAGNOSIS — Z419 Encounter for procedure for purposes other than remedying health state, unspecified: Secondary | ICD-10-CM | POA: Diagnosis not present

## 2024-03-25 ENCOUNTER — Telehealth (HOSPITAL_COMMUNITY): Payer: Self-pay

## 2024-03-25 ENCOUNTER — Telehealth: Payer: Self-pay

## 2024-03-25 ENCOUNTER — Other Ambulatory Visit (HOSPITAL_COMMUNITY): Payer: Self-pay

## 2024-03-25 NOTE — Telephone Encounter (Signed)
 Pharmacy Patient Advocate Encounter   Received notification from Pt Calls Messages that prior authorization for Dapagliflozin  Propanediol 10MG  tablets  is required/requested.   Insurance verification completed.   The patient is insured through [employer]ICA[employer].   Per test claim: PA required; PA submitted to above mentioned insurance via Latent Key/confirmation #/EOC [id number] Status is pending

## 2024-03-25 NOTE — Telephone Encounter (Signed)
 PA request has been Received. New Encounter has been or will be created for follow up. For additional info see Pharmacy Prior Auth telephone encounter from 03/25/24.

## 2024-03-25 NOTE — Telephone Encounter (Signed)
 Copied from CRM [id number]. Topic: Clinical - Medication Prior Auth >> Mar 25, 2024  2:52 PM Pinkey ORN wrote: Reason for CRM: dapagliflozin  propanediol (FARXIGA ) 10 MG TABS tablet >> Mar 25, 2024  2:54 PM Pinkey ORN wrote: Patient states he needs a prior authorization for his medication dapagliflozin  propanediol (FARXIGA ) 10 MG TABS tablet. Please follow up with patient pharmacy.

## 2024-03-26 ENCOUNTER — Ambulatory Visit (HOSPITAL_COMMUNITY)
Admission: RE | Admit: 2024-03-26 | Discharge: 2024-03-26 | Disposition: A | Discharge status: Home / Self Care | Source: Ambulatory Visit | Attending: Plastic Surgery | Admitting: Plastic Surgery

## 2024-03-26 ENCOUNTER — Other Ambulatory Visit (HOSPITAL_COMMUNITY): Payer: Self-pay

## 2024-03-26 DIAGNOSIS — Z432 Encounter for attention to ileostomy: Secondary | ICD-10-CM

## 2024-03-26 DIAGNOSIS — K439 Ventral hernia without obstruction or gangrene: Secondary | ICD-10-CM

## 2024-03-26 DIAGNOSIS — L245 Irritant contact dermatitis due to other chemical products: Secondary | ICD-10-CM | POA: Insufficient documentation

## 2024-03-26 DIAGNOSIS — L24B3 Irritant contact dermatitis related to fecal or urinary stoma or fistula: Secondary | ICD-10-CM | POA: Diagnosis not present

## 2024-03-26 DIAGNOSIS — T8189XD Other complications of procedures, not elsewhere classified, subsequent encounter: Secondary | ICD-10-CM

## 2024-03-26 NOTE — Telephone Encounter (Signed)
 Pharmacy Patient Advocate Encounter  Received notification from [employer] that Prior Authorization for Dapagliflozin  Propanediol 10MG  tablets  has been APPROVED from 03/25/24 to 03/25/25. Ran test claim, Copay is $4. This test claim was processed through [facility]- copay amounts may vary at other pharmacies due to pharmacy/plan contracts, or as the patient moves through the different stages of their insurance plan.   PA #/Case ID/Reference #: [id number]

## 2024-03-26 NOTE — Progress Notes (Signed)
 [facility]   Reason for visit:  RLQ ileostomy Ventral hernia Nonhealing abdominal wounds HPI:  Perforated diverticulum with end ileostomy  Past Medical History:  Diagnosis Date   Acute respiratory failure with hypoxia (HCC) 12/23/2022   Alcohol withdrawal syndrome, with delirium (HCC) 12/27/2022   COPD (chronic obstructive pulmonary disease) (HCC)    Diabetes mellitus without complication (HCC)    Diverticulitis    DVT (deep venous thrombosis) (HCC)    x3   Encephalopathy acute 12/28/2022   ETOH abuse    H/O colectomy    Hyperlipidemia    Hypertension    Sepsis associated hypotension (HCC) 08/03/2019   Smoker    Family History  Problem Relation Age of Onset   COPD Mother    Prostate cancer Father    Colon cancer Neg Hx    Rectal cancer Neg Hx    Stomach cancer Neg Hx    Esophageal cancer Neg Hx    Allergies  Allergen Reactions   Codeine Hives   Lisinopril Other (See Comments)    Other reaction(s): renal effects   Current Outpatient Medications  Medication Sig Dispense Refill Last Dose/Taking   acetaminophen  (ACETAMINOPHEN  8 HOUR) 650 MG CR tablet Take 1 tablet (650 mg total) by mouth every 8 (eight) hours as needed for pain 90 tablet 1    allopurinol  (ZYLOPRIM ) 100 MG tablet Take 1 tablet (100 mg total) by mouth daily. 90 tablet 3    ascorbic acid  (VITAMIN C ) 500 MG tablet Take 1 tablet (500 mg total) by mouth daily. 30 tablet 1    atorvastatin  (LIPITOR) 10 MG tablet Take 1 tablet (10 mg total) by mouth daily. 90 tablet 3    cetirizine  (ZYRTEC ) 10 MG tablet Take 1 tablet (10 mg total) by mouth daily. 90 tablet 3    Continuous Glucose Receiver (DEXCOM G7 RECEIVER) DEVI Use as directed. 1 each 0    Continuous Glucose Sensor (DEXCOM G7 SENSOR) MISC Use as directed to monitor blood glucose, changing sensor every 10 days. 3 each 3    dapagliflozin  propanediol (FARXIGA ) 10 MG TABS tablet Take 1 tablet (10 mg total) by mouth daily before breakfast. 90 tablet  3    fluticasone  (FLOVENT  HFA) 110 MCG/ACT inhaler Inhale 1 puff into the lungs in the morning and at bedtime. 12 g 2    folic acid  (FOLVITE ) 1 MG tablet Take 1 tablet (1 mg total) by mouth daily. 90 tablet 0    gabapentin  (NEURONTIN ) 300 MG capsule Take 2 capsules (600 mg total) by mouth 2 (two) times daily. 360 capsule 3    guaiFENesin  (MUCINEX ) 600 MG 12 hr tablet Take 1 tablet (600 mg total) by mouth 2 (two) times daily as needed to loosen phlegm or cough. 180 tablet 1    hydrOXYzine  (VISTARIL ) 50 MG capsule Take 1 capsule (50 mg total) by mouth at bedtime as needed. 30 capsule 2    ipratropium-albuterol  (DUONEB) 0.5-2.5 (3) MG/3ML SOLN Take 3 mLs by nebulization every 6 (six) hours as needed (wheezing / sob).      losartan  (COZAAR ) 25 MG tablet Take 1 tablet (25 mg total) by mouth daily. 90 tablet 3    metFORMIN  (GLUCOPHAGE ) 1000 MG tablet Take 1 tablet (1,000 mg total) by mouth 2 (two) times daily with a meal. 60 tablet 3    metoprolol  succinate (TOPROL -XL) 25 MG 24 hr tablet Take 1 tablet (25 mg total) by mouth daily. 90 tablet 3    Tiotropium Bromide -Olodaterol 2.5-2.5 MCG/ACT  AERS Inhale 2 puffs into the lungs daily. 4 g 3    tirzepatide (MOUNJARO) 2.5 MG/0.5ML Pen Inject 2.5 mg into the skin once a week. 2 mL 1    torsemide  (DEMADEX ) 20 MG tablet Take 4 tablets (80 mg total) by mouth daily. 306 tablet 3    triamcinolone  cream (KENALOG ) 0.5 % Apply topically to legs twice daily for up to 2 weeks as needed for eczema 30 g 1    No current facility-administered medications for this encounter.   ROS  Review of Systems  Constitutional:  Positive for fatigue.  Respiratory:  Positive for cough and shortness of breath.   Cardiovascular:        Hypertenstion  Gastrointestinal:        Ventral hernia Ileostomy  Musculoskeletal:        Weakness Debility   Skin:  Positive for color change, rash and wound.       Abdomen and peristomal skin breakdown  Neurological:        Neuropathy feet   Psychiatric/Behavioral:  Positive for dysphoric mood.        Awaiting [facility] surgery   All other systems reviewed and are negative.  Vital signs:  BP (!) 145/82 (BP Location: Right Arm)   Pulse 68   Temp 97.6 F (36.4 C) (Oral)   Resp 17   SpO2 95%  Exam:  Physical Exam Vitals reviewed.  Constitutional:      Appearance: Normal appearance.  HENT:     Mouth/Throat:     Mouth: Mucous membranes are moist.  Cardiovascular:     Rate and Rhythm: Normal rate.     Pulses: Normal pulses.     Heart sounds: Normal heart sounds.  Pulmonary:     Breath sounds: Wheezing present.  Abdominal:     Hernia: A hernia is present.     Comments: Nonhealing surgical wound   Musculoskeletal:     Comments: Weakness, uses wheelchair in office today.   Skin:    General: Skin is warm and dry.     Findings: Lesion and rash present.  Neurological:     Mental Status: He is alert and oriented to person, place, and time. Mental status is at baseline.     Sensory: Sensory deficit present.  Psychiatric:        Mood and Affect: Mood normal.        Behavior: Behavior normal.     Stoma type/location:  RLQ ileostomy Stomal assessment/size:  1 recessed Peristomal assessment:  denuded skin , creasing Treatment options for stomal/peristomal skin: stoma powder and skin prep  barrier ring  Output: liquid brown stool  Ostomy pouching: 1pc flat  Education provided:  pouch change performed.   Wound care performed to abdomen.  Cleanse with VASHE and air dry. Aquacel to open wounds  cover with gauze and ABD pads  tape.     Impression/dx  Medical adhesive related skin injury  Nonhealing wound VEntral hernia Ileostomy Discussion  See back as needed   Plan  Will continue VASHE cleanses to abdominal skin daily.  Aquacel to open wounds.  Chronic use of tape causing irritation.     Visit time: 45 minutes.   Darice Cooley FNP-BC

## 2024-03-29 ENCOUNTER — Telehealth: Payer: Self-pay

## 2024-03-29 NOTE — Telephone Encounter (Signed)
 Copied from CRM #[id number]. Topic: Clinical - Medical Advice >> Mar 29, 2024 11:30 AM Rea ORN wrote: Reason for CRM: Pt asking for nurse/CMA to call back regarding Mounjaro and Dexcom receiver. Please call back

## 2024-03-29 NOTE — Telephone Encounter (Signed)
 Copied from CRM #[id number]. Topic: Clinical - Medication Prior Auth >> Mar 29, 2024  1:19 PM Burnard DEL wrote: Reason for CRM: Bernarda from [facility] called about PA  for [facility].She stated that she just needed some clinical questions answered. She is going to be faxing over the information that is requested as well.

## 2024-03-29 NOTE — Patient Outreach (Signed)
 Complex Care Management   Visit Note  03/29/2024  Name:  Luis [name] MRN: [id number] DOB: 10-17-1960  Situation: RNCM called to follow up and/or schedule another time for telephone assessment. Patient states this is not a good time. Agreeable to scheduling another follow up telephone call. Patient with no questions or concerns at this time.  Recommendation:   PCP Follow-up next visit confirmed with patient 04/05/24  Follow Up Plan:   Telephone follow up appointment date/time:  04/04/24  Heddy Shutter, RN, MSN, BSN, CCM [place]  [facility], Population Health Case Manager Phone: [phone]

## 2024-03-29 NOTE — Patient Instructions (Signed)
 Visit Information  Mr. Maddy was given information about Medicaid Managed Care team care coordination services as a part of their [facility] Medicaid benefit.   If you would like to schedule transportation through your [employer] plan, please call the following number at least 2 days in advance of your appointment: [phone].   You can also use the MTM portal or MTM mobile app to manage your rides. Reimbursement for transportation is available through [facility]! For the portal, please go to [email or web address]  Call the [facility] Crisis Line at [phone], at any time, 24 hours a day, 7 days a week. If you are in danger or need immediate medical attention call 911.   Patient verbalizes understanding of instructions and care plan provided today and agrees to view in MyChart. Active MyChart status and patient understanding of how to access instructions and care plan via MyChart confirmed with patient.     Telephone follow up appointment with Managed Medicaid care management team member scheduled for:04/04/24 at 2:30 pm  Heddy Shutter, RN, MSN, BSN, CCM [place]  [facility], Population Health Case Manager Phone: [phone]

## 2024-03-30 DIAGNOSIS — T8189XA Other complications of procedures, not elsewhere classified, initial encounter: Secondary | ICD-10-CM | POA: Diagnosis not present

## 2024-03-30 DIAGNOSIS — K469 Unspecified abdominal hernia without obstruction or gangrene: Secondary | ICD-10-CM | POA: Diagnosis not present

## 2024-04-01 ENCOUNTER — Telehealth: Payer: Self-pay

## 2024-04-01 ENCOUNTER — Ambulatory Visit (INDEPENDENT_AMBULATORY_CARE_PROVIDER_SITE_OTHER): Payer: Self-pay

## 2024-04-01 DIAGNOSIS — I442 Atrioventricular block, complete: Secondary | ICD-10-CM | POA: Diagnosis not present

## 2024-04-01 NOTE — Telephone Encounter (Signed)
 Copied from CRM #[id number]. Topic: Clinical - Medication Prior Auth >> Apr 01, 2024 12:50 PM [place] J wrote: Reason for CRM: Ayana calling from Medicaid to see if she could get confirmation on tried or failed medications from the past. This information is needed in order to proceed with the prior authorization process for mounjaro.  Please call: [phone]

## 2024-04-02 ENCOUNTER — Ambulatory Visit (HOSPITAL_COMMUNITY): Admission: RE | Admit: 2024-04-02 | Source: Ambulatory Visit | Admitting: Nurse Practitioner

## 2024-04-02 ENCOUNTER — Other Ambulatory Visit: Payer: Self-pay

## 2024-04-02 ENCOUNTER — Other Ambulatory Visit (HOSPITAL_COMMUNITY): Payer: Self-pay

## 2024-04-02 DIAGNOSIS — Z7689 Persons encountering health services in other specified circumstances: Secondary | ICD-10-CM | POA: Diagnosis not present

## 2024-04-02 DIAGNOSIS — Z932 Ileostomy status: Secondary | ICD-10-CM | POA: Diagnosis not present

## 2024-04-02 DIAGNOSIS — S31109A Unspecified open wound of abdominal wall, unspecified quadrant without penetration into peritoneal cavity, initial encounter: Secondary | ICD-10-CM | POA: Diagnosis not present

## 2024-04-03 ENCOUNTER — Ambulatory Visit: Payer: Self-pay | Admitting: Cardiology

## 2024-04-03 ENCOUNTER — Ambulatory Visit: Admitting: Podiatry

## 2024-04-03 LAB — CUP PACEART REMOTE DEVICE CHECK
Battery Remaining Longevity: 96 mo
Battery Voltage: 3.01 V
Brady Statistic AS VP Percent: 0 %
Brady Statistic AS VS Percent: 0 %
Brady Statistic RV Percent Paced: 99.91 %
Date Time Interrogation Session: 20251017181356
Implantable Pulse Generator Implant Date: 20220721
Lead Channel Impedance Value: 630 Ohm
Lead Channel Pacing Threshold Amplitude: 0.25 V
Lead Channel Pacing Threshold Pulse Width: 0.24 ms
Lead Channel Sensing Intrinsic Amplitude: 4.163 mV
Lead Channel Setting Pacing Amplitude: 0.75 V
Lead Channel Setting Pacing Pulse Width: 0.24 ms
Lead Channel Setting Sensing Sensitivity: 2 mV

## 2024-04-04 ENCOUNTER — Telehealth: Payer: Self-pay

## 2024-04-04 ENCOUNTER — Other Ambulatory Visit: Payer: Self-pay

## 2024-04-04 ENCOUNTER — Other Ambulatory Visit (HOSPITAL_COMMUNITY): Payer: Self-pay

## 2024-04-04 NOTE — Patient Instructions (Signed)
 Luis [name] - I am sorry I was unable to reach you today for our scheduled appointment. I work with Elnor [name], NP and am calling to support your healthcare needs. Please contact me at [phone] at your earliest convenience. I look forward to speaking with you soon.   Thank you,   Heddy Shutter, RN, MSN, BSN, CCM [place]  [facility] Population Health Case Manager Phone: [phone]

## 2024-04-05 ENCOUNTER — Ambulatory Visit (INDEPENDENT_AMBULATORY_CARE_PROVIDER_SITE_OTHER): Admitting: Nurse Practitioner

## 2024-04-05 ENCOUNTER — Telehealth (HOSPITAL_COMMUNITY): Payer: Self-pay

## 2024-04-05 ENCOUNTER — Other Ambulatory Visit (HOSPITAL_COMMUNITY): Payer: Self-pay

## 2024-04-05 ENCOUNTER — Ambulatory Visit (HOSPITAL_COMMUNITY)
Admission: RE | Admit: 2024-04-05 | Discharge: 2024-04-05 | Disposition: A | Discharge status: Home / Self Care | Source: Ambulatory Visit | Attending: Nurse Practitioner | Admitting: Nurse Practitioner

## 2024-04-05 VITALS — BP 132/74 | HR 64 | Temp 98.1°F | Ht 73.0 in | Wt 229.2 lb

## 2024-04-05 DIAGNOSIS — E1142 Type 2 diabetes mellitus with diabetic polyneuropathy: Secondary | ICD-10-CM | POA: Diagnosis not present

## 2024-04-05 DIAGNOSIS — T8189XD Other complications of procedures, not elsewhere classified, subsequent encounter: Secondary | ICD-10-CM

## 2024-04-05 DIAGNOSIS — I5042 Chronic combined systolic (congestive) and diastolic (congestive) heart failure: Secondary | ICD-10-CM | POA: Diagnosis not present

## 2024-04-05 DIAGNOSIS — Z432 Encounter for attention to ileostomy: Secondary | ICD-10-CM | POA: Diagnosis not present

## 2024-04-05 DIAGNOSIS — E1169 Type 2 diabetes mellitus with other specified complication: Secondary | ICD-10-CM | POA: Diagnosis not present

## 2024-04-05 DIAGNOSIS — K9419 Other complications of enterostomy: Secondary | ICD-10-CM | POA: Diagnosis not present

## 2024-04-05 DIAGNOSIS — K439 Ventral hernia without obstruction or gangrene: Secondary | ICD-10-CM

## 2024-04-05 DIAGNOSIS — E785 Hyperlipidemia, unspecified: Secondary | ICD-10-CM | POA: Diagnosis not present

## 2024-04-05 DIAGNOSIS — Z932 Ileostomy status: Secondary | ICD-10-CM | POA: Diagnosis present

## 2024-04-05 DIAGNOSIS — Z7984 Long term (current) use of oral hypoglycemic drugs: Secondary | ICD-10-CM | POA: Diagnosis not present

## 2024-04-05 DIAGNOSIS — I10 Essential (primary) hypertension: Secondary | ICD-10-CM | POA: Diagnosis not present

## 2024-04-05 MED ORDER — OZEMPIC (0.25 OR 0.5 MG/DOSE) 2 MG/3ML ~~LOC~~ SOPN
0.2500 mg | PEN_INJECTOR | SUBCUTANEOUS | 1 refills | Status: DC
  Filled 2024-04-05: qty 3, fill #0
  Filled 2024-04-05: qty 3, 28d supply, fill #0
  Filled 2024-04-08: qty 3, 56d supply, fill #0

## 2024-04-05 NOTE — Telephone Encounter (Signed)
 Issue has been address in different encounter and PA has been started.

## 2024-04-05 NOTE — Progress Notes (Signed)
 Remote PPM Transmission

## 2024-04-05 NOTE — Assessment & Plan Note (Signed)
 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 Ozempic preferred for cardiovascular protection. Hyperlipidemia well-controlled with atorvastatin . - Start Ozempic 0.25 mg once a week. - Continue Farxiga  10 mg daily. - Continue Metformin  1000 mg Qam and 500 mg Qpm. - Continue atorvastatin  10 mg daily. - Continue losartan  - Continue gabapentin  - Order lipid panel.

## 2024-04-05 NOTE — Assessment & Plan Note (Signed)
 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 Pacemaker stable. - Continue losartan  25 mg daily. - Continue metoprolol  25 mg daily. - Continue torsemide  80 mg daily.

## 2024-04-05 NOTE — Progress Notes (Signed)
 Luis [name]   Reason for visit:  RLQ ileostomy with nonhealing abdominal wound  ventral hernia HPI:  COPD  Respiratory failure Diverticulitis with perforation Past Medical History:  Diagnosis Date   Acute respiratory failure with hypoxia (HCC) 12/23/2022   Alcohol withdrawal syndrome, with delirium (HCC) 12/27/2022   COPD (chronic obstructive pulmonary disease) (HCC)    Diabetes mellitus without complication (HCC)    Diverticulitis    DVT (deep venous thrombosis) (HCC)    x3   Encephalopathy acute 12/28/2022   ETOH abuse    H/O colectomy    Hyperlipidemia    Hypertension    Sepsis associated hypotension (HCC) 08/03/2019   Smoker    Family History  Problem Relation Age of Onset   COPD Mother    Prostate cancer Father    Colon cancer Neg Hx    Rectal cancer Neg Hx    Stomach cancer Neg Hx    Esophageal cancer Neg Hx    Allergies  Allergen Reactions   Codeine Hives   Lisinopril Other (See Comments)    Other reaction(s): renal effects   Current Outpatient Medications  Medication Sig Dispense Refill Last Dose/Taking   acetaminophen  (ACETAMINOPHEN  8 HOUR) 650 MG CR tablet Take 1 tablet (650 mg total) by mouth every 8 (eight) hours as needed for pain 90 tablet 1    allopurinol  (ZYLOPRIM ) 100 MG tablet Take 1 tablet (100 mg total) by mouth daily. 90 tablet 3    ascorbic acid  (VITAMIN C ) 500 MG tablet Take 1 tablet (500 mg total) by mouth daily. 30 tablet 1    atorvastatin  (LIPITOR) 10 MG tablet Take 1 tablet (10 mg total) by mouth daily. 90 tablet 3    cetirizine  (ZYRTEC ) 10 MG tablet Take 1 tablet (10 mg total) by mouth daily. 90 tablet 3    Continuous Glucose Receiver (DEXCOM G7 RECEIVER) DEVI Use as directed. 1 each 0    Continuous Glucose Sensor (DEXCOM G7 SENSOR) MISC Use as directed to monitor blood glucose, changing sensor every 10 days. 3 each 3    dapagliflozin  propanediol (FARXIGA ) 10 MG TABS tablet Take 1 tablet (10 mg total) by mouth daily before  breakfast. 90 tablet 3    fluticasone  (FLOVENT  HFA) 110 MCG/ACT inhaler Inhale 1 puff into the lungs in the morning and at bedtime. 12 g 2    folic acid  (FOLVITE ) 1 MG tablet Take 1 tablet (1 mg total) by mouth daily. 90 tablet 0    gabapentin  (NEURONTIN ) 300 MG capsule Take 2 capsules (600 mg total) by mouth 2 (two) times daily. 360 capsule 3    guaiFENesin  (MUCINEX ) 600 MG 12 hr tablet Take 1 tablet (600 mg total) by mouth 2 (two) times daily as needed to loosen phlegm or cough. 180 tablet 1    hydrOXYzine  (VISTARIL ) 50 MG capsule Take 1 capsule (50 mg total) by mouth at bedtime as needed. 30 capsule 2    ipratropium-albuterol  (DUONEB) 0.5-2.5 (3) MG/3ML SOLN Take 3 mLs by nebulization every 6 (six) hours as needed (wheezing / sob).      losartan  (COZAAR ) 25 MG tablet Take 1 tablet (25 mg total) by mouth daily. 90 tablet 3    metFORMIN  (GLUCOPHAGE ) 1000 MG tablet Take 1 tablet (1,000 mg total) by mouth 2 (two) times daily with a meal. 60 tablet 3    metoprolol  succinate (TOPROL -XL) 25 MG 24 hr tablet Take 1 tablet (25 mg total) by mouth daily. 90 tablet 3    Semaglutide,0.25  or 0.5MG /DOS, (OZEMPIC, 0.25 OR 0.5 MG/DOSE,) 2 MG/3ML SOPN Inject 0.25 mg into the skin once a week. 3 mL 1    Tiotropium Bromide -Olodaterol 2.5-2.5 MCG/ACT AERS Inhale 2 puffs into the lungs daily. 4 g 3    torsemide  (DEMADEX ) 20 MG tablet Take 4 tablets (80 mg total) by mouth daily. 306 tablet 3    triamcinolone  cream (KENALOG ) 0.5 % Apply topically to legs twice daily for up to 2 weeks as needed for eczema 30 g 1    Zoster Vaccine Adjuvanted (SHINGRIX ) injection Inject into the muscle. 1 each 1    No current facility-administered medications for this encounter.   ROS  Review of Systems  Constitutional:  Positive for fatigue.  Respiratory:  Positive for shortness of breath.        Shortness of breath with activity  Gastrointestinal:        RLQ ileostomy Ventral hernia  Endocrine:       Now has continuous blood  sugar monitoring  Musculoskeletal:  Positive for gait problem.  Skin:  Positive for color change, rash and wound.  Neurological:  Positive for numbness.       Neuropathy to feet  Psychiatric/Behavioral:  Positive for dysphoric mood.   All other systems reviewed and are negative.  Vital signs:  BP 114/65   Pulse 71   Temp 98.3 F (36.8 C) (Oral)   Resp 18   SpO2 95%  Exam:  Physical Exam Vitals reviewed.  Constitutional:      Appearance: He is obese.  HENT:     Mouth/Throat:     Mouth: Mucous membranes are moist.  Cardiovascular:     Rate and Rhythm: Normal rate.  Pulmonary:     Comments: Diminished breath sounds Shortness of breath with activity Abdominal:     Hernia: A hernia is present.     Comments: Nonhealing wound  Musculoskeletal:     Comments: Weakness   Skin:    General: Skin is warm.     Findings: Erythema and rash present.     Comments: Periwound redness to perimeter from medical adhesive use Cleansed with VASHE  Neurological:     Mental Status: He is alert and oriented to person, place, and time. Mental status is at baseline.     Sensory: Sensory deficit present.  Psychiatric:        Mood and Affect: Mood normal.        Behavior: Behavior normal.     Stoma type/location:  RlQ ileostomy, flush Stomal assessment/size:  1  flush  Peristomal assessment:  Ventral hernia  Treatment options for stomal/peristomal skin: Stoma powder and skin prep 1 piece flat pouch   Output: soft brown stool  Ostomy pouching: 1pc. flat Education provided:  wound care to abdomen  aquacel and VASHE to periwound skin      Impression/dx  Ventral hernia Ileostomy Nonhealing abdominal wound Discussion  Trying to get records sent  to another surgeon in [place] Has stopped drinking, per son who is present today.  Plan  See back as needed    Visit time: 40 minutes.   Darice Cooley FNP-BC

## 2024-04-05 NOTE — Telephone Encounter (Signed)
 PA request has been Received. New Encounter has been or will be created for follow up. For additional info see Pharmacy Prior Auth telephone encounter from 04/05/24.

## 2024-04-05 NOTE — Progress Notes (Signed)
 Established Patient Office Visit  Subjective   Patient ID: Luis [name], male    DOB: Jan 08, 1961  Age: 63 y.o. MRN: [id number]  Chief Complaint  Patient presents with   Diabetes    Discussed the use of AI scribe software for clinical note transcription with the patient, who gave verbal consent to proceed.  History of Present Illness Luis [name] is a 63 year old male with type 2 diabetes and chronic heart disease who presents for medication management and follow-up.  Glycemic control and antihyperglycemic therapy - Uncontrolled, last A1C >8 - Colleague attempted to start patient on mounjaro, but ozempic appears to be preferred - Currently takes metformin  1000 mg twice daily and Farxiga  10 mg daily - Occasionally misses evening dose of metformin  - No history of pancreatitis, gallstones, or thyroid  cancer - Increased craving for sweets since diagnosis of diabetes  Chronic heart disease and heart failure management - Chronic heart disease with both systolic and diastolic heart failure - Hypertension present - Medications include atorvastatin  10 mg daily, losartan  25 mg daily, metoprolol  25 mg daily, and torsemide  80 mg daily - Pacemaker parameters are stable - Continues to follow with cardiology  Nicotine  dependence and alcohol use - Currently using nicotine  patches to quit smoking - Abstains from alcohol due to concerns about overconsumption      ROS: see HPI    Objective:     BP 132/74   Pulse 64   Temp 98.1 F (36.7 C) (Temporal)   Ht 6' 1 (1.854 m)   Wt 229 lb 4 oz (104 kg)   SpO2 96%   BMI 30.25 kg/m    Physical Exam Vitals reviewed.  Constitutional:      Appearance: Normal appearance.  HENT:     Head: Normocephalic and atraumatic.  Cardiovascular:     Rate and Rhythm: Normal rate and regular rhythm.  Pulmonary:     Effort: Pulmonary effort is normal.     Breath sounds: Normal breath sounds.  Musculoskeletal:     Cervical back: Neck supple.   Skin:    General: Skin is warm and dry.  Neurological:     Mental Status: He is alert and oriented to person, place, and time.  Psychiatric:        Mood and Affect: Mood normal.        Behavior: Behavior normal.        Thought Content: Thought content normal.        Judgment: Judgment normal.      No results found for any visits on 04/05/24.    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 Pacemaker stable. - Continue losartan  25 mg daily. - Continue metoprolol  25 mg daily. - Continue torsemide  80 mg daily.      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 Pacemaker stable. - Continue losartan  25 mg daily. -  Continue metoprolol  25 mg daily. - Continue torsemide  80 mg daily.      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 Ozempic preferred for cardiovascular protection. Hyperlipidemia well-controlled with atorvastatin . - Start Ozempic 0.25 mg once a week. - Continue Farxiga  10 mg daily. - Continue Metformin  1000 mg Qam and 500 mg Qpm. -  Continue atorvastatin  10 mg daily. - Continue losartan  - Continue gabapentin  - Order lipid panel.      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 Ozempic preferred for cardiovascular protection. Hyperlipidemia well-controlled with atorvastatin . - Start Ozempic 0.25 mg once a week. - Continue Farxiga  10 mg daily. - Continue Metformin  1000 mg Qam and 500 mg Qpm. - Continue atorvastatin  10 mg daily. - Continue losartan  - Continue gabapentin  - Order lipid panel.      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 Ozempic preferred for cardiovascular protection. Hyperlipidemia well-controlled with atorvastatin . - Start Ozempic 0.25 mg once a week. - Continue Farxiga  10 mg daily. - Continue Metformin  1000 mg Qam and 500 mg Qpm. - Continue atorvastatin  10 mg daily. - Continue losartan  - Continue gabapentin  - Order lipid panel.  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 Pacemaker stable. - Continue losartan  25 mg daily. - Continue metoprolol  25 mg daily. - Continue torsemide  80 mg daily.  Dry skin and hyperpigmentation of lower extremity Dry skin and hyperpigmentation likely due to venous stasis. Moisturizing advised. - Apply Vaseline or Aquaphor twice a day.   Return in  about 1 month (around 05/06/2024) for F/U with Jalah [place].    Luis FORBES Pereyra, NP

## 2024-04-05 NOTE — Telephone Encounter (Signed)
 Issue address during pt visit, pt is given two sample of Dexcome till we hear back from insurance, PA in progress  for dexcom

## 2024-04-05 NOTE — Telephone Encounter (Signed)
 Pharmacy Patient Advocate Encounter   Received notification from Pt Calls Messages that prior authorization for Ozempic (0.25 or 0.5 MG/DOSE) 2MG /3ML pen-injectors  is required/requested.   Insurance verification completed.   The patient is insured through [employer].   Per test claim: PA required; PA submitted to above mentioned insurance via Latent Key/confirmation #/EOC [id number] Status is pending

## 2024-04-08 ENCOUNTER — Telehealth: Payer: Self-pay

## 2024-04-08 ENCOUNTER — Other Ambulatory Visit (HOSPITAL_COMMUNITY): Payer: Self-pay

## 2024-04-08 ENCOUNTER — Other Ambulatory Visit: Payer: Self-pay

## 2024-04-08 ENCOUNTER — Ambulatory Visit: Payer: Self-pay

## 2024-04-08 NOTE — Telephone Encounter (Signed)
 Copied from CRM [id number]. Topic: Clinical - Medication Prior Auth >> Apr 08, 2024  2:05 PM Aisha D wrote: Reason for CRM:  Ayana calling from Medicaid to see if she could get confirmation on tried or failed medications from the past. This information is needed in order to proceed with the prior authorization process for mounjaro.  Please call: [phone]  UPDATE: Ayanna is calling back to confirm if the pt has tried ozempic, trulicity, victoza, or byetta. Ayana would like a callback with an update at the number listed above.

## 2024-04-08 NOTE — Telephone Encounter (Signed)
 Patient reports blood sugar readings in low 300s with his Dexcom. Patient is instructed to check his blood sugar with his old reader to see if there is any difference. Dexcom reading was 309.  Fingerstick blood stick was 340-Asymptomatic.  Spent time educating patient on how to use the Accu chek soft clix. Patient endorses that he is needing diabetic education in regards to diet. States he is also needing a in person session with how inject his Ozempic and practice with the Dexcom. Patient is asking for a follow up call from office.   FYI Only or Action Required?: Action required by provider: update on patient condition.  Patient was last seen in primary care on 04/05/2024 by Elnor [name], NP.  Called Nurse Triage reporting Hyperglycemia.  Symptoms began today.  Interventions attempted: Rest, hydration, or home remedies.  Symptoms are: unchanged.  Triage Disposition: Call PCP Now  Patient/caregiver understands and will follow disposition?: Yes Copied from CRM [id number]. Topic: Clinical - Red Word Triage >> Apr 08, 2024  2:53 PM Eva FALCON wrote: Red Word that prompted transfer to Nurse Triage: Pt checked his blood sugar and its at 300's. Reason for Disposition  [1] Blood glucose > 300 mg/dL (83.2 mmol/L) AND [7] two or more times in a row  Answer Assessment - Initial Assessment Questions 1. BLOOD GLUCOSE: What is your blood glucose level?      308 2. ONSET: When did you check the blood glucose?     Prior to calling nurse triage 3. USUAL RANGE: What is your glucose level usually? (e.g., usual fasting morning value, usual evening value)     Patient is unsure of what his glucose level is normally 4. KETONES: Do you check for ketones (urine or blood test strips)? If Yes, ask: What does the test show now?      NA 5. TYPE 1 or 2:  Do you know what type of diabetes you have?  (e.g., Type 1, Type 2, Gestational; doesn't know)      Type 2 6. INSULIN : Do you take insulin ? What  type of insulin (s) do you use? What is the mode of delivery? (syringe, pen; injection or pump)?      No insulin -was recently approved for ozempic 7. DIABETES PILLS: Do you take any pills for your diabetes? If Yes, ask: Have you missed taking any pills recently?     Metformin  8. OTHER SYMPTOMS: Do you have any symptoms? (e.g., fever, frequent urination, difficulty breathing, dizziness, weakness, vomiting)     no  Protocols used: Diabetes - High Blood Sugar-A-AH

## 2024-04-08 NOTE — Telephone Encounter (Signed)
 Pharmacy Patient Advocate Encounter  Received notification from [employer] that Prior Authorization for Ozempic 2mg /46ml has been APPROVED from 04/05/24 to 04/05/25   PA #/Case ID/Reference #: [id number]  Approval letter indexed to media tab

## 2024-04-09 ENCOUNTER — Other Ambulatory Visit

## 2024-04-09 ENCOUNTER — Ambulatory Visit (HOSPITAL_COMMUNITY): Admitting: Nurse Practitioner

## 2024-04-09 ENCOUNTER — Ambulatory Visit: Payer: Self-pay

## 2024-04-09 ENCOUNTER — Ambulatory Visit (HOSPITAL_COMMUNITY)
Admission: RE | Admit: 2024-04-09 | Discharge: 2024-04-09 | Disposition: A | Discharge status: Home / Self Care | Source: Ambulatory Visit | Attending: Nurse Practitioner | Admitting: Nurse Practitioner

## 2024-04-09 DIAGNOSIS — E785 Hyperlipidemia, unspecified: Secondary | ICD-10-CM | POA: Diagnosis not present

## 2024-04-09 DIAGNOSIS — K439 Ventral hernia without obstruction or gangrene: Secondary | ICD-10-CM | POA: Diagnosis not present

## 2024-04-09 DIAGNOSIS — I5042 Chronic combined systolic (congestive) and diastolic (congestive) heart failure: Secondary | ICD-10-CM

## 2024-04-09 DIAGNOSIS — I10 Essential (primary) hypertension: Secondary | ICD-10-CM | POA: Diagnosis not present

## 2024-04-09 DIAGNOSIS — E1142 Type 2 diabetes mellitus with diabetic polyneuropathy: Secondary | ICD-10-CM | POA: Diagnosis not present

## 2024-04-09 DIAGNOSIS — Z432 Encounter for attention to ileostomy: Secondary | ICD-10-CM | POA: Diagnosis not present

## 2024-04-09 DIAGNOSIS — M10072 Idiopathic gout, left ankle and foot: Secondary | ICD-10-CM | POA: Diagnosis not present

## 2024-04-09 DIAGNOSIS — E1169 Type 2 diabetes mellitus with other specified complication: Secondary | ICD-10-CM | POA: Diagnosis not present

## 2024-04-09 DIAGNOSIS — Z932 Ileostomy status: Secondary | ICD-10-CM | POA: Insufficient documentation

## 2024-04-09 DIAGNOSIS — T8189XD Other complications of procedures, not elsewhere classified, subsequent encounter: Secondary | ICD-10-CM

## 2024-04-09 LAB — LIPID PANEL
Cholesterol: 156 mg/dL (ref 0–200)
HDL: 22.3 mg/dL — ABNORMAL LOW (ref >39.00)
NonHDL: 133.86
Total CHOL/HDL Ratio: 7
Triglycerides: 842 mg/dL — ABNORMAL HIGH (ref 0.0–149.0)
VLDL: 168.4 mg/dL — ABNORMAL HIGH (ref 0.0–40.0)

## 2024-04-09 LAB — COMPREHENSIVE METABOLIC PANEL WITH GFR
ALT: 30 U/L (ref 0–53)
AST: 32 U/L (ref 0–37)
Albumin: 4.3 g/dL (ref 3.5–5.2)
Alkaline Phosphatase: 236 U/L — ABNORMAL HIGH (ref 39–117)
BUN: 57 mg/dL — ABNORMAL HIGH (ref 6–23)
CO2: 22 meq/L (ref 19–32)
Calcium: 10.1 mg/dL (ref 8.4–10.5)
Chloride: 94 meq/L — ABNORMAL LOW (ref 96–112)
Creatinine, Ser: 1.6 mg/dL — ABNORMAL HIGH (ref 0.40–1.50)
GFR: 45.2 mL/min — ABNORMAL LOW (ref >60.00)
Glucose, Bld: 311 mg/dL — ABNORMAL HIGH (ref 70–99)
Potassium: 3.9 meq/L (ref 3.5–5.1)
Sodium: 130 meq/L — ABNORMAL LOW (ref 135–145)
Total Bilirubin: 0.8 mg/dL (ref 0.2–1.2)
Total Protein: 8.6 g/dL — ABNORMAL HIGH (ref 6.0–8.3)

## 2024-04-09 LAB — HEMOGLOBIN A1C: Hgb A1c MFr Bld: 11.1 % — ABNORMAL HIGH (ref 4.6–6.5)

## 2024-04-09 LAB — LDL CHOLESTEROL, DIRECT: Direct LDL: 27 mg/dL

## 2024-04-09 NOTE — Telephone Encounter (Signed)
 Called [facility], spoke with Diane, regarding patient requests to speak with clinical staff.  Diane reports Elnor and nurse out of office, send over message.

## 2024-04-09 NOTE — Telephone Encounter (Signed)
 Patient calling back in, does not want to talk to a triage nurse, as he talked to NT for 45 minutes stated previous NT nurse sent note over to clinical staff and is wanting to  speak to clinical staff. Advised patient the office is closed. Patient states his blood sugar is 300 or mid 200's, does not know what to do. denies any physical symptoms. Patient states PCP got him all these toys, Ozempic, dexacon 7 ect. rarely checked blood sugar previously so was unsure what baseline readings were. Advised patient to hydrate, walk around some and continue to check blood sugar. Educated when to seek medical attention. Please advise.

## 2024-04-09 NOTE — Telephone Encounter (Addendum)
 Copied from CRM [id number]. Topic: Clinical - Red Word Triage >> Apr 09, 2024  3:43 PM Mia F wrote: Red Word that prompted transfer to Nurse Triage: Blood sugar readings of 500 and now its 239. He says he does not know how to use the device and also has questions baout how to use the Semaglutide,0.25 or 0.5MG /DOS, (OZEMPIC, 0.25 OR 0.5 MG/DOSE,) 2 MG/3ML SOPN Reason for Disposition  [1] Blood glucose > 240 mg/dL (86.6 mmol/L) AND [7] pregnant  Answer Assessment - Initial Assessment Questions Unable to fully triage patient, patient frustrated and reports just seen in clinic today and reports been talking to nurses all day. Patient requests to speak with clinical staff. Patient has questions about blood sugar and Dexacon device.  Advised UC/ED if symptoms worsen.  1. BLOOD GLUCOSE: What is your blood glucose level?      Dexacon 7  reading 235 2. ONSET: When did you check the blood glucose?     now 3. USUAL RANGE: What is your glucose level usually? (e.g., usual fasting morning value, usual evening value)     According to dexacon 274  5. TYPE 1 or 2:  Do you know what type of diabetes you have?  (e.g., Type 1, Type 2, Gestational; doesn't know)      Type II 6. INSULIN : Do you take insulin ? What type of insulin (s) do you use? What is the mode of delivery? (syringe, pen; injection or pump)?      no 7. DIABETES PILLS: Do you take any pills for your diabetes? If Yes, ask: Have you missed taking any pills recently?     metformin  8. OTHER SYMPTOMS: Do you have any symptoms? (e.g., fever, frequent urination, difficulty breathing, dizziness, weakness, vomiting) Unable to fully triage for other symptoms.  Protocols used: Diabetes - High Blood Sugar-A-AH Advised UC today and ED if symptoms worsen.

## 2024-04-09 NOTE — Progress Notes (Signed)
 [facility]   Reason for visit:  RLQ Ileostomy with ventral hernia  Nonhealing midline abdominal wound  HPI:  Perforated diverticulitis  End ileostomy  Past Medical History:  Diagnosis Date   Acute respiratory failure with hypoxia (HCC) 12/23/2022   Alcohol withdrawal syndrome, with delirium (HCC) 12/27/2022   COPD (chronic obstructive pulmonary disease) (HCC)    Diabetes mellitus without complication (HCC)    Diverticulitis    DVT (deep venous thrombosis) (HCC)    x3   Encephalopathy acute 12/28/2022   ETOH abuse    H/O colectomy    Hyperlipidemia    Hypertension    Sepsis associated hypotension (HCC) 08/03/2019   Smoker    Family History  Problem Relation Age of Onset   COPD Mother    Prostate cancer Father    Colon cancer Neg Hx    Rectal cancer Neg Hx    Stomach cancer Neg Hx    Esophageal cancer Neg Hx    Allergies  Allergen Reactions   Codeine Hives   Lisinopril Other (See Comments)    Other reaction(s): renal effects   Current Outpatient Medications  Medication Sig Dispense Refill Last Dose/Taking   acetaminophen  (TYLENOL ) 325 MG tablet Take 325-500 mg by mouth every 8 (eight) hours as needed for moderate pain (pain score 4-6).      allopurinol  (ZYLOPRIM ) 100 MG tablet Take 1 tablet (100 mg total) by mouth daily. 90 tablet 3    ascorbic acid  (VITAMIN C ) 500 MG tablet Take 1 tablet (500 mg total) by mouth daily. 30 tablet 1    atorvastatin  (LIPITOR) 10 MG tablet Take 1 tablet (10 mg total) by mouth daily. 90 tablet 3    cetirizine  (ZYRTEC ) 5 MG tablet Take 5 mg by mouth daily.      Continuous Glucose Receiver (DEXCOM G7 RECEIVER) DEVI Use as directed. 1 each 0    Continuous Glucose Sensor (DEXCOM G7 SENSOR) MISC Use as directed to monitor blood glucose, changing sensor every 10 days. 3 each 3    dapagliflozin  propanediol (FARXIGA ) 10 MG TABS tablet Take 1 tablet (10 mg total) by mouth daily before breakfast. 90 tablet 3    fluticasone  (FLOVENT   HFA) 110 MCG/ACT inhaler Inhale 1 puff into the lungs in the morning and at bedtime. 12 g 2    folic acid  (FOLVITE ) 1 MG tablet Take 1 tablet (1 mg total) by mouth daily. 90 tablet 0    gabapentin  (NEURONTIN ) 300 MG capsule Take 2 capsules (600 mg total) by mouth 3 (three) times daily. 360 capsule 3    guaiFENesin  (MUCINEX ) 600 MG 12 hr tablet Take 1 tablet (600 mg total) by mouth 2 (two) times daily as needed to loosen phlegm or cough. 180 tablet 1    hydrOXYzine  (ATARAX ) 25 MG tablet Take 25 mg by mouth at bedtime as needed.      insulin  glargine (LANTUS  SOLOSTAR) 100 UNIT/ML Solostar Pen Inject 10 Units into the skin daily. 15 mL 1    Insulin  Pen Needle 32G X 4 MM MISC Use once daily to administer insulin  100 each 1    ipratropium-albuterol  (DUONEB) 0.5-2.5 (3) MG/3ML SOLN Take 3 mLs by nebulization every 6 (six) hours as needed (wheezing / sob).      losartan  (COZAAR ) 25 MG tablet Take 1 tablet (25 mg total) by mouth daily. 90 tablet 3    metFORMIN  (GLUCOPHAGE ) 500 MG tablet Take 1 tablet (500 mg total) by mouth 2 (two) times daily with a  meal. 180 tablet 0    metoprolol  succinate (TOPROL -XL) 25 MG 24 hr tablet Take 1 tablet (25 mg total) by mouth daily. 90 tablet 3    Semaglutide,0.25 or 0.5MG /DOS, (OZEMPIC, 0.25 OR 0.5 MG/DOSE,) 2 MG/3ML SOPN Inject 0.25 mg into the skin once a week. 3 mL 1    Tiotropium Bromide -Olodaterol 2.5-2.5 MCG/ACT AERS Inhale 2 puffs into the lungs daily. 4 g 3    torsemide  (DEMADEX ) 20 MG tablet Take 4 tablets (80 mg total) by mouth daily. 306 tablet 3    triamcinolone  cream (KENALOG ) 0.5 % Apply topically to legs twice daily for up to 2 weeks as needed for eczema 30 g 1    Zoster Vaccine Adjuvanted (SHINGRIX ) injection Inject into the muscle. 1 each 1    No current facility-administered medications for this encounter.   ROS  Review of Systems Vital signs:  BP 117/74   Pulse 68   Temp 98.1 F (36.7 C) (Oral)   Resp 20   SpO2 95%  Exam:  Physical Exam   Stoma type/location:  *** Stomal assessment/size:  *** Peristomal assessment:  *** Treatment options for stomal/peristomal skin: *** Output: *** Ostomy pouching: 1pc./2pc.  Education provided:  ***    Impression/dx  *** Discussion  *** Plan  ***    Visit time: *** minutes.   Darice Cooley FNP-BC

## 2024-04-10 ENCOUNTER — Telehealth: Payer: Self-pay

## 2024-04-10 ENCOUNTER — Ambulatory Visit: Payer: Self-pay

## 2024-04-10 ENCOUNTER — Ambulatory Visit: Payer: Self-pay | Admitting: Podiatry

## 2024-04-10 LAB — URIC ACID: Uric Acid: 11.9 mg/dL — ABNORMAL HIGH (ref 3.8–8.4)

## 2024-04-10 NOTE — Telephone Encounter (Signed)
 Copied from CRM [id number]. Topic: Clinical - Medication Question >> Apr 10, 2024 10:26 AM Anairis L wrote: Reason for CRM: Tim from [facility] medication mounjaro 2.5/ 2 authorization and would like to know if patient has tried at least two alternatives.   [phone]

## 2024-04-11 ENCOUNTER — Other Ambulatory Visit (HOSPITAL_COMMUNITY): Payer: Self-pay

## 2024-04-11 ENCOUNTER — Other Ambulatory Visit: Payer: Self-pay

## 2024-04-11 ENCOUNTER — Other Ambulatory Visit: Payer: Self-pay | Admitting: Nurse Practitioner

## 2024-04-11 ENCOUNTER — Telehealth: Payer: Self-pay | Admitting: Lab

## 2024-04-11 ENCOUNTER — Other Ambulatory Visit: Payer: Self-pay | Admitting: Podiatry

## 2024-04-11 ENCOUNTER — Encounter: Payer: Self-pay | Admitting: Nurse Practitioner

## 2024-04-11 DIAGNOSIS — E782 Mixed hyperlipidemia: Secondary | ICD-10-CM

## 2024-04-11 DIAGNOSIS — F1020 Alcohol dependence, uncomplicated: Secondary | ICD-10-CM

## 2024-04-11 DIAGNOSIS — K7460 Unspecified cirrhosis of liver: Secondary | ICD-10-CM | POA: Insufficient documentation

## 2024-04-11 DIAGNOSIS — R9431 Abnormal electrocardiogram [ECG] [EKG]: Secondary | ICD-10-CM

## 2024-04-11 DIAGNOSIS — E1142 Type 2 diabetes mellitus with diabetic polyneuropathy: Secondary | ICD-10-CM

## 2024-04-11 DIAGNOSIS — I5042 Chronic combined systolic (congestive) and diastolic (congestive) heart failure: Secondary | ICD-10-CM

## 2024-04-11 DIAGNOSIS — E1169 Type 2 diabetes mellitus with other specified complication: Secondary | ICD-10-CM

## 2024-04-11 DIAGNOSIS — J449 Chronic obstructive pulmonary disease, unspecified: Secondary | ICD-10-CM

## 2024-04-11 MED ORDER — GABAPENTIN 300 MG PO CAPS
600.0000 mg | ORAL_CAPSULE | Freq: Three times a day (TID) | ORAL | 3 refills | Status: AC
  Filled 2024-04-11 (×2): qty 360, 60d supply, fill #0
  Filled 2024-06-12: qty 360, 60d supply, fill #1

## 2024-04-11 MED ORDER — INSULIN PEN NEEDLE 32G X 4 MM MISC
1.0000 | Freq: Every day | 1 refills | Status: AC
  Filled 2024-04-11 (×2): qty 100, 90d supply, fill #0
  Filled 2024-07-13: qty 100, 90d supply, fill #1

## 2024-04-11 MED ORDER — METFORMIN HCL 500 MG PO TABS
500.0000 mg | ORAL_TABLET | Freq: Two times a day (BID) | ORAL | 0 refills | Status: AC
  Filled 2024-04-11: qty 180, 90d supply, fill #0

## 2024-04-11 MED ORDER — LANTUS SOLOSTAR 100 UNIT/ML ~~LOC~~ SOPN
10.0000 [IU] | PEN_INJECTOR | Freq: Every day | SUBCUTANEOUS | 1 refills | Status: DC
  Filled 2024-04-11 (×2): qty 9, 90d supply, fill #0

## 2024-04-11 NOTE — Telephone Encounter (Signed)
 Issue address, pt has been approved for ozempic.

## 2024-04-11 NOTE — Telephone Encounter (Signed)
 I have consulted with both pharmacy services and Dr. Geofm (supervising physician). Based on his recent labs and high blood sugars I will make the following suggestions:  Metformin : Because his kidney filtration has been up and down and is currently down. He needs to lower dose of Metformin  to 500mg  twice a day. I will send in new prescription to [facility] pharmacy. Ozempic: He will actually NOT start this medication yet. His triglycerides are very very high, this puts him at risk for pancreatitis. Ozempic is associated with pancreatitis as well. Thus, I am going to hold off on starting Ozempic until his triglycerides are better Insulin : I am going to START him on Lantus  10 units/day. I will send in prescription of the insulin  pen and needles. He will need to inject 10 units into the subcutaneous fat once a day.  Now that he is starting on insulin  his insurance should approve coverage of Dexcom. Please re-start prior authorization for the Dexcom and indicate that he is now on insulin  I am referring him back to our pharmacist Cristy to help me with managing his chronic conditions and with helping to assist him with any questions he has regarding how to use his insulin  pen and dexcom.  He needs to come back to see me in 2 weeks if I have an opening.  He needs to continue to abstain from alcohol. His labs indicate issues with liver damage. I have ordered referral to GI to assist in monitoring his liver.   Please call the patient and notify him of the above. Let me know if he has any questions about the above.

## 2024-04-11 NOTE — Telephone Encounter (Signed)
 Left voice mail for pt about being approved of ozempic and being denied the Dexcom. Also send pt my chart message.

## 2024-04-11 NOTE — Addendum Note (Signed)
 Addended by: ELNOR [name] on: 04/11/2024 03:18 PM   Modules accepted: Orders

## 2024-04-11 NOTE — Addendum Note (Signed)
 Addended by: ELNOR [name] on: 04/11/2024 02:37 PM   Modules accepted: Orders

## 2024-04-11 NOTE — Telephone Encounter (Addendum)
 Patient calling to state needs Gabapentin  increase in quantity he states he takes two three times a day and only has 1 pill left please send to pharmacy.States last saw Dr.Wagoner but Dr. Joya is his provider.

## 2024-04-11 NOTE — Telephone Encounter (Signed)
 Pt aware and verbalized understanding. Pt has been also schedule on the 04/27/24 at 4:20pm

## 2024-04-11 NOTE — Telephone Encounter (Signed)
 Issue has been address, pt has been approved for ozempic.

## 2024-04-11 NOTE — Telephone Encounter (Signed)
 Made pt aware that per provider he needs to go to the ER, pt stated ok and that his sugar has not been in the 300's, it just seems to be going up and close to it and not 300. Still advise pt that if it dose he need to. Pt also stated that he has his ozempic and does not know how to use, told him to come by the office and he will be shown how to.

## 2024-04-11 NOTE — Telephone Encounter (Signed)
 This encounter was created in error - please disregard.

## 2024-04-15 ENCOUNTER — Telehealth: Payer: Self-pay | Admitting: *Deleted

## 2024-04-15 ENCOUNTER — Other Ambulatory Visit: Payer: Self-pay | Admitting: Podiatry

## 2024-04-15 ENCOUNTER — Other Ambulatory Visit (HOSPITAL_COMMUNITY): Payer: Self-pay

## 2024-04-15 ENCOUNTER — Other Ambulatory Visit: Payer: Self-pay

## 2024-04-15 ENCOUNTER — Telehealth: Payer: Self-pay | Admitting: Lab

## 2024-04-15 MED ORDER — COLCHICINE 0.6 MG PO TABS
0.6000 mg | ORAL_TABLET | Freq: Every day | ORAL | 0 refills | Status: DC
  Filled 2024-04-15 – 2024-04-16 (×2): qty 7, 7d supply, fill #0

## 2024-04-15 NOTE — Telephone Encounter (Signed)
 Patient states he is having gout flare up if you can please call him in some medication please advise.

## 2024-04-15 NOTE — Progress Notes (Signed)
 Care Guide Pharmacy Note  04/15/2024 Name: [name] MRN: [id number] DOB: 07-27-1960  Referred By: Elnor [name], NP Reason for referral: Complex Care Management (Outreach to schedule referral with pharmacist )   [name] is a 63 y.o. year old male who is a primary care patient of Elnor [name], NP.  [name] was referred to the pharmacist for assistance related to: HTN and DMII  Successful contact was made with the patient to discuss pharmacy services including being ready for the pharmacist to call at least 5 minutes before the scheduled appointment time and to have medication bottles and any blood pressure readings ready for review. The patient agreed to meet with the pharmacist via telephone visit on 04/18/2024  Thedford Franks, CMA [place]  [facility], [facility] Guide Direct Dial: [phone]  Fax: [phone] Website: [email or web address]

## 2024-04-16 ENCOUNTER — Other Ambulatory Visit: Payer: Self-pay

## 2024-04-16 ENCOUNTER — Other Ambulatory Visit (HOSPITAL_COMMUNITY): Payer: Self-pay

## 2024-04-16 ENCOUNTER — Ambulatory Visit (HOSPITAL_COMMUNITY)
Admission: RE | Admit: 2024-04-16 | Discharge: 2024-04-16 | Disposition: A | Discharge status: Home / Self Care | Source: Ambulatory Visit | Attending: Nurse Practitioner | Admitting: Nurse Practitioner

## 2024-04-16 DIAGNOSIS — L24B3 Irritant contact dermatitis related to fecal or urinary stoma or fistula: Secondary | ICD-10-CM

## 2024-04-16 DIAGNOSIS — Z432 Encounter for attention to ileostomy: Secondary | ICD-10-CM

## 2024-04-16 DIAGNOSIS — K4020 Bilateral inguinal hernia, without obstruction or gangrene, not specified as recurrent: Secondary | ICD-10-CM

## 2024-04-16 DIAGNOSIS — K439 Ventral hernia without obstruction or gangrene: Secondary | ICD-10-CM | POA: Insufficient documentation

## 2024-04-16 DIAGNOSIS — Z932 Ileostomy status: Secondary | ICD-10-CM | POA: Diagnosis present

## 2024-04-16 DIAGNOSIS — Z7689 Persons encountering health services in other specified circumstances: Secondary | ICD-10-CM | POA: Diagnosis not present

## 2024-04-16 NOTE — Progress Notes (Signed)
 [facility]   Reason for visit:  RLq ileostomy Ventral hernia Nonhealing abdominal wound HPI:  Perforated diverticulitis  Past Medical History:  Diagnosis Date   Acute respiratory failure with hypoxia (HCC) 12/23/2022   Alcohol withdrawal syndrome, with delirium (HCC) 12/27/2022   COPD (chronic obstructive pulmonary disease) (HCC)    Diabetes mellitus without complication (HCC)    Diverticulitis    DVT (deep venous thrombosis) (HCC)    x3   Encephalopathy acute 12/28/2022   ETOH abuse    H/O colectomy    Hyperlipidemia    Hypertension    Sepsis associated hypotension (HCC) 08/03/2019   Smoker    Family History  Problem Relation Age of Onset   COPD Mother    Prostate cancer Father    Colon cancer Neg Hx    Rectal cancer Neg Hx    Stomach cancer Neg Hx    Esophageal cancer Neg Hx    Allergies  Allergen Reactions   Codeine Hives   Lisinopril Other (See Comments)    Other reaction(s): renal effects   Current Outpatient Medications  Medication Sig Dispense Refill Last Dose/Taking   colchicine  0.6 MG tablet Take 1 tablet (0.6 mg total) by mouth daily. 7 tablet 0    acetaminophen  (TYLENOL ) 325 MG tablet Take 325-500 mg by mouth every 8 (eight) hours as needed for moderate pain (pain score 4-6).      allopurinol  (ZYLOPRIM ) 100 MG tablet Take 1 tablet (100 mg total) by mouth daily. 90 tablet 3    ascorbic acid  (VITAMIN C ) 500 MG tablet Take 1 tablet (500 mg total) by mouth daily. 30 tablet 1    atorvastatin  (LIPITOR) 10 MG tablet Take 1 tablet (10 mg total) by mouth daily. 90 tablet 3    cetirizine  (ZYRTEC ) 5 MG tablet Take 5 mg by mouth daily.      Continuous Glucose Receiver (DEXCOM G7 RECEIVER) DEVI Use as directed. 1 each 0    dapagliflozin  propanediol (FARXIGA ) 10 MG TABS tablet Take 1 tablet (10 mg total) by mouth daily before breakfast. 90 tablet 3    fluticasone  (FLOVENT  HFA) 110 MCG/ACT inhaler Inhale 1 puff into the lungs in the morning and at bedtime.  12 g 2    folic acid  (FOLVITE ) 1 MG tablet Take 1 tablet (1 mg total) by mouth daily. 90 tablet 0    gabapentin  (NEURONTIN ) 300 MG capsule Take 2 capsules (600 mg total) by mouth 3 (three) times daily. 360 capsule 3    guaiFENesin  (MUCINEX ) 600 MG 12 hr tablet Take 1 tablet (600 mg total) by mouth 2 (two) times daily as needed to loosen phlegm or cough. 180 tablet 1    hydrOXYzine  (ATARAX ) 25 MG tablet Take 25 mg by mouth at bedtime as needed.      insulin  glargine (LANTUS  SOLOSTAR) 100 UNIT/ML Solostar Pen Inject 10 Units into the skin daily. 15 mL 1    Insulin  Pen Needle 32G X 4 MM MISC Use once daily to administer insulin  100 each 1    ipratropium-albuterol  (DUONEB) 0.5-2.5 (3) MG/3ML SOLN Take 3 mLs by nebulization every 6 (six) hours as needed (wheezing / sob).      losartan  (COZAAR ) 25 MG tablet Take 1 tablet (25 mg total) by mouth daily. 90 tablet 3    metFORMIN  (GLUCOPHAGE ) 500 MG tablet Take 1 tablet (500 mg total) by mouth 2 (two) times daily with a meal. 180 tablet 0    metoprolol  succinate (TOPROL -XL) 25 MG 24  hr tablet Take 1 tablet (25 mg total) by mouth daily. 90 tablet 3    Semaglutide,0.25 or 0.5MG /DOS, (OZEMPIC, 0.25 OR 0.5 MG/DOSE,) 2 MG/3ML SOPN Inject 0.25 mg into the skin once a week. 3 mL 1    Tiotropium Bromide -Olodaterol 2.5-2.5 MCG/ACT AERS Inhale 2 puffs into the lungs daily. 4 g 3    torsemide  (DEMADEX ) 20 MG tablet Take 4 tablets (80 mg total) by mouth daily. 306 tablet 3    triamcinolone  cream (KENALOG ) 0.5 % Apply topically to legs twice daily for up to 2 weeks as needed for eczema 30 g 1    Zoster Vaccine Adjuvanted (SHINGRIX ) injection Inject into the muscle. 1 each 1    No current facility-administered medications for this encounter.   ROS  Review of Systems  Constitutional:  Positive for fatigue.  Respiratory:  Positive for shortness of breath.   Gastrointestinal:        Ventral hernia Ileostomy   Endocrine:       Diabetes Gout flare   Genitourinary:  Negative.   Musculoskeletal:  Positive for gait problem.       Weakness and shortness of breath with activity  Skin:  Positive for color change, rash and wound.  Neurological:        Neuropathy in hands and feet  Psychiatric/Behavioral: Negative.    All other systems reviewed and are negative.  Vital signs:  BP 128/73   Pulse 64   Temp 98.2 F (36.8 C) (Oral)   Resp 20   SpO2 96%  Exam:  Physical Exam Vitals reviewed.  Constitutional:      Appearance: He is obese.  HENT:     Mouth/Throat:     Mouth: Mucous membranes are moist.  Cardiovascular:     Rate and Rhythm: Normal rate.     Heart sounds: Normal heart sounds.  Pulmonary:     Effort: Pulmonary effort is normal.  Abdominal:     Hernia: A hernia is present.  Skin:    General: Skin is warm and dry.     Findings: Erythema and rash present.  Neurological:     Mental Status: He is alert and oriented to person, place, and time. Mental status is at baseline.  Psychiatric:        Mood and Affect: Mood normal.        Behavior: Behavior normal.     Stoma type/location:  RLQ ileostomy Stomal assessment/size:  1 flush  Peristomal assessment:  hernia bulging, erythema Treatment options for stomal/peristomal skin: barrier ring and 1piece flat pouch  Output: soft brown stool  Ostomy pouching: 1pc. Flat  Education provided:  pouch change performed Wound care performed.  Cleansed with VASHE, apply aquacel and gauze  ABd pads and tape    Impression/dx  Hernia  Ileostomy Ventral hernia  Discussion  Reports he is not drinking alcohol Trying to cut back smoking  Plan  See back as needed    Visit time: 45 minutes.   Darice Cooley FNP-BC

## 2024-04-17 NOTE — Telephone Encounter (Signed)
 Issue address in different encounter

## 2024-04-17 NOTE — Discharge Instructions (Signed)
 No changes.  Continue to follow up with [facility] in [place]

## 2024-04-18 ENCOUNTER — Telehealth: Payer: Self-pay | Admitting: Pharmacist

## 2024-04-18 ENCOUNTER — Other Ambulatory Visit

## 2024-04-18 NOTE — Telephone Encounter (Signed)
 Called patient for 9:30 AM pharmacist telephone appointment on both home and cell numbers. No answer, unable to leave voicemail.  Darrelyn Drum, PharmD, BCPS, CPP Clinical Pharmacist Practitioner [facility] [phone]

## 2024-04-18 NOTE — Telephone Encounter (Unsigned)
 Copied from CRM #[id number]. Topic: General - Call Back - No Documentation >> Apr 18, 2024  4:14 PM Avram MATSU wrote: Reason for CRM: patient missed a call from Cristina

## 2024-04-19 ENCOUNTER — Telehealth: Payer: Self-pay | Admitting: *Deleted

## 2024-04-19 NOTE — Progress Notes (Signed)
 Complex Care Management Care Guide Note  04/19/2024 Name: Luis [name] MRN: [id number] DOB: Feb 01, 1961  Luis [name] is a 63 y.o. year old male who is a primary care patient of Elnor [name], NP and is actively engaged with the care management team. I reached out to [name] by phone today to assist with re-scheduling  with the Pharmacist.  Follow up plan: Telephone appointment with complex care management team member scheduled for:  04/25/2024  Thedford Franks, CMA [place]  [facility], [facility] Guide Direct Dial: [phone]  Fax: [phone] Website: [email or web address]

## 2024-04-19 NOTE — Progress Notes (Signed)
 Complex Care Management Care Guide Note  04/19/2024 Name: Luis [name] MRN: [id number] DOB: [birth year]  Luis [name] is a 63 y.o. year old male who is a primary care patient of Elnor [name], NP and is actively engaged with the care management team. I reached out to [name] by phone today to assist with re-scheduling  with the Pharmacist.  Follow up plan: Unsuccessful telephone outreach attempt made. A HIPAA compliant phone message was left for the patient providing contact information and requesting a return call.  Thedford Franks, CMA, [place]  [facility], [facility] Guide Direct Dial: [phone]  Fax: [phone] Website: [email or web address]

## 2024-04-22 ENCOUNTER — Other Ambulatory Visit: Payer: Self-pay

## 2024-04-23 ENCOUNTER — Ambulatory Visit (HOSPITAL_COMMUNITY): Admitting: Nurse Practitioner

## 2024-04-23 ENCOUNTER — Ambulatory Visit: Admitting: Podiatry

## 2024-04-23 ENCOUNTER — Other Ambulatory Visit (HOSPITAL_COMMUNITY): Payer: Self-pay

## 2024-04-23 ENCOUNTER — Other Ambulatory Visit: Payer: Self-pay

## 2024-04-23 DIAGNOSIS — M10072 Idiopathic gout, left ankle and foot: Secondary | ICD-10-CM

## 2024-04-23 DIAGNOSIS — E1142 Type 2 diabetes mellitus with diabetic polyneuropathy: Secondary | ICD-10-CM | POA: Diagnosis not present

## 2024-04-23 DIAGNOSIS — I739 Peripheral vascular disease, unspecified: Secondary | ICD-10-CM | POA: Diagnosis not present

## 2024-04-23 DIAGNOSIS — Z7689 Persons encountering health services in other specified circumstances: Secondary | ICD-10-CM | POA: Diagnosis not present

## 2024-04-23 DIAGNOSIS — M792 Neuralgia and neuritis, unspecified: Secondary | ICD-10-CM | POA: Diagnosis not present

## 2024-04-23 DIAGNOSIS — K469 Unspecified abdominal hernia without obstruction or gangrene: Secondary | ICD-10-CM | POA: Diagnosis not present

## 2024-04-23 DIAGNOSIS — T8189XA Other complications of procedures, not elsewhere classified, initial encounter: Secondary | ICD-10-CM | POA: Diagnosis not present

## 2024-04-23 MED ORDER — CICLOPIROX 8 % EX SOLN
Freq: Every day | CUTANEOUS | 2 refills | Status: AC
  Filled 2024-04-23 – 2024-04-24 (×2): qty 6.6, 30d supply, fill #0

## 2024-04-23 NOTE — Discharge Instructions (Signed)
 No changes Good work with smoking reduction and cessation of alcohol

## 2024-04-23 NOTE — Progress Notes (Unsigned)
 Subjective: Chief Complaint  Patient presents with   Numbness    Patient is here for foot pain with numbness and tingling   63 year old male presents the office today with multiple complaints.  His main concern is getting numbness, tingling he points about the mid leg down to his feet where he gets the symptoms.  He is concerned that his legs are going to be cut off.  He is also concerned that his uric acid level.  He has been on allopurinol .  He gets pain to the arches of the feet and is noticed a small nodule at times.  He does not report any injuries.  Objective: AAO x3, NAD DP/PT pulses palpable bilaterally, CRT less than 3 seconds Protective sensation decreased with Semmes Weinstein monofilament Chronic skin changes present with hemosiderin deposits to both legs.  There is no open lesions noted at this time.  Chronic appearing edema present to bilateral lower extremities. 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  There is mild tenderness palpation of this area.  Not able to appreciate any area of pinpoint tenderness. 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  -He is already on gabapentin .  Can continue this.  I will also refer him to [facility] given ongoing neuropathic pain.  Will also try for Qutenza approval. -I ordered ABI to rule out any circulatory issues -Continue allopurinol .  Will recheck uric acid level in about 2 weeks.  Order was given today. -He has shoes that were made as well as inserts.  Is been wearing very flat shoes.  Discussed turn to wear shoes with better arch support.  Did not complete over the next appointment so we can evaluate.  We discussed  directions or sizes. -Patient encouraged to call the office with any questions, concerns, change in symptoms.   No follow-ups on file.  Donnice JONELLE Fees DPM

## 2024-04-24 ENCOUNTER — Other Ambulatory Visit: Payer: Self-pay

## 2024-04-24 ENCOUNTER — Telehealth (HOSPITAL_COMMUNITY): Payer: Self-pay | Admitting: Pharmacy Technician

## 2024-04-24 ENCOUNTER — Other Ambulatory Visit (HOSPITAL_COMMUNITY): Payer: Self-pay

## 2024-04-25 ENCOUNTER — Other Ambulatory Visit (HOSPITAL_COMMUNITY): Payer: Self-pay

## 2024-04-25 ENCOUNTER — Other Ambulatory Visit: Admitting: Pharmacist

## 2024-04-25 ENCOUNTER — Telehealth: Payer: Self-pay | Admitting: Pharmacist

## 2024-04-25 ENCOUNTER — Telehealth: Payer: Self-pay

## 2024-04-25 DIAGNOSIS — E1169 Type 2 diabetes mellitus with other specified complication: Secondary | ICD-10-CM

## 2024-04-25 NOTE — Telephone Encounter (Signed)
 Copied from CRM #[id number]. Topic: Clinical - Prescription Issue >> Apr 25, 2024 10:41 AM Tinnie BROCKS wrote: Reason for CRM: Pt is receiving notices that he needs to change his dexcom 7 sensor, but the prior auth is in appeal so he is unable to get any. He wants to know what he should do or if he can come pick up a sample of the sensor. Requesting call back at [phone]. >> Apr 25, 2024 12:37 PM Viola F wrote: Patient called to follow up on this, per clinic access line I let the patient know that he will be getting a call from the pharmacist today at 1pm

## 2024-04-25 NOTE — Progress Notes (Signed)
 04/25/2024 Name: Luis [name] MRN: [id number] DOB: 07-23-1960  Chief Complaint  Patient presents with   Diabetes   Hyperlipidemia   Medication Management    Luis [name] is a 63 y.o. year old male who presented for a telephone visit.   They were referred to the pharmacist by their PCP for assistance in managing diabetes and hyperlipidemia/cardiovascular risk reduction.    Subjective:  Care Team: Primary Care Provider: Elnor [name], NP ; Next Scheduled Visit: 04/26/24  Medication Access/Adherence  Current Pharmacy:  [facility] - [facility] [address] [place] [address] Phone: [phone] Fax: [phone]  [name] [address] [place] [address] Phone: [phone] Fax: [phone]   Patient reports affordability concerns with their medications: No  Patient reports access/transportation concerns to their pharmacy: No  Patient reports adherence concerns with their medications:  No    Pt came by the office to get a sample G7 sensor because he has been unable to pick sensors up from the pharmacy due to insurance needing authorization to approve it  Diabetes:  Current medications: Lantus  10 units daily, metformin  500 mg BID, Farxiga  10 mg daily Medications tried in the past: Semglee   *Pt took one dose of Ozempic 0.25 mg before he was told to stop due to high triglycerides. Confirmed he has not been taking Ozempic since then.  Current glucose readings: Unable to see pt's readings today but he reports his readings have been mostly in the green (70-180) on his Dexcom app, sometimes up to 200.   Macrovascular and Microvascular Risk Reduction:  Statin? yes (atorvastatin ); ACEi/ARB? yes (losartan ) Last urinary albumin/creatinine ratio:   Last eye exam:   Last foot exam: No foot exam found Tobacco Use:  Tobacco Use: High Risk (04/23/2024)   Patient History    Smoking Tobacco Use: Every Day     Smokeless Tobacco Use: Current    Passive Exposure: Not on file   Hyperlipidemia/ASCVD Risk Reduction  Current lipid lowering medications: atorvastatin  10 mg daily Medications tried in the past: atorvastatin  20 mg?, Lovaza   Antiplatelet regimen: none   Objective:  Lab Results  Component Value Date   HGBA1C 11.1 (H) 04/09/2024    Lab Results  Component Value Date   CREATININE 1.60 (H) 04/09/2024   BUN 57 (H) 04/09/2024   NA 130 (L) 04/09/2024   K 3.9 04/09/2024   CL 94 (L) 04/09/2024   CO2 22 04/09/2024    Lab Results  Component Value Date   CHOL 156 04/09/2024   HDL 22.30 (L) 04/09/2024   LDLCALC 53 12/26/2020   LDLDIRECT 27.0 04/09/2024   TRIG (H) 04/09/2024    842.0 Triglyceride is over 400; calculations on Lipids are invalid.   CHOLHDL 7 04/09/2024    Medications Reviewed Today     Reviewed by Merceda Lela SAUNDERS, [facility] (Pharmacist) on 04/25/24 at 1401  Med List Status: <None>   Medication Order Taking? Sig Documenting Provider Last Dose Status Informant  acetaminophen  (TYLENOL ) 325 MG tablet [id number]  Take 325-500 mg by mouth every 8 (eight) hours as needed for moderate pain (pain score 4-6). [provider]  Active   allopurinol  (ZYLOPRIM ) 100 MG tablet [id number]  Take 1 tablet (100 mg total) by mouth daily. Elnor [name], NP  Active   ascorbic acid  (VITAMIN C ) 500 MG tablet [id number]  Take 1 tablet (500 mg total) by mouth daily.   Active Self, Pharmacy Records, Multiple Informants  atorvastatin  (LIPITOR)  10 MG tablet [id number]  Take 1 tablet (10 mg total) by mouth daily. Elnor [name], NP  Active   cetirizine  (ZYRTEC ) 5 MG tablet [id number]  Take 5 mg by mouth daily. [provider]  Active   ciclopirox (PENLAC) 8 % solution [id number]  Apply topically at bedtime over nail and surrounding skin. Apply daily over previous coat. After seven (7) days, remove with alcohol and continue cycle. Gershon Donnice SAUNDERS, DPM  Active   colchicine  0.6 MG tablet  [id number]  Take 1 tablet (0.6 mg total) by mouth daily. Gershon Donnice SAUNDERS, DPM  Active   Continuous Glucose Receiver [facility] G7 RECEIVER) [place] [id number]  Use as directed. Alvia Corean CROME, FNP  Active   Continuous Glucose Sensor (DEXCOM G7 SENSOR) [place] [id number]  Use as directed to monitor blood glucose, changing sensor every 10 days. Alvia Corean CROME, FNP  Active   dapagliflozin  propanediol (FARXIGA ) 10 MG TABS tablet [id number]  Take 1 tablet (10 mg total) by mouth daily before breakfast. Elnor [name], NP  Active   fluticasone  (FLOVENT  HFA) 110 MCG/ACT inhaler [id number]  Inhale 1 puff into the lungs in the morning and at bedtime. Danton Reyes DASEN, MD  Active   folic acid  (FOLVITE ) 1 MG tablet [id number]  Take 1 tablet (1 mg total) by mouth daily. Elnor [name], NP  Active   gabapentin  (NEURONTIN ) 300 MG capsule [id number]  Take 2 capsules (600 mg total) by mouth 3 (three) times daily. Sikora, Rebecca, DPM  Active   guaiFENesin  (MUCINEX ) 600 MG 12 hr tablet [id number]  Take 1 tablet (600 mg total) by mouth 2 (two) times daily as needed to loosen phlegm or cough. Elnor [name], NP  Active Self, Pharmacy Records, Multiple Informants           Med Note (LEE, NICOLE   Wed Dec 13, 2023  2:51 AM)    hydrOXYzine  (ATARAX ) 25 MG tablet [id number]  Take 25 mg by mouth at bedtime as needed. [provider]  Active     Discontinued 03/15/23 1105 (Change in therapy)   insulin  glargine (LANTUS  SOLOSTAR) 100 UNIT/ML Solostar Pen [id number] Yes Inject 10 Units into the skin daily. Elnor [name], NP  Active   Insulin  Pen Needle 32G X 4 MM MISC [id number]  Use once daily to administer insulin  Elnor [name], NP  Active   ipratropium-albuterol  (DUONEB) 0.5-2.5 (3) MG/3ML SOLN [id number]  Take 3 mLs by nebulization every 6 (six) hours as needed (wheezing / sob). [provider]  Active Self, Pharmacy Records, Multiple Informants  losartan  (COZAAR ) 25 MG tablet [id number]  Take 1 tablet (25 mg total)  by mouth daily. Elnor [name], NP  Active   metFORMIN  (GLUCOPHAGE ) 500 MG tablet [id number]  Take 1 tablet (500 mg total) by mouth 2 (two) times daily with a meal. Elnor [name], NP  Active   metoprolol  succinate (TOPROL -XL) 25 MG 24 hr tablet [id number]  Take 1 tablet (25 mg total) by mouth daily. Gardenia Led, DO  Active Self, Pharmacy Records, Multiple Informants  Semaglutide,0.25 or 0.5MG /DOS, (OZEMPIC, 0.25 OR 0.5 MG/DOSE,) 2 MG/3ML SOPN [id number]  Inject 0.25 mg into the skin once a week.  Patient not taking: Reported on 04/25/2024   Elnor [name], NP  Active   Tiotropium Bromide -Olodaterol 2.5-2.5 MCG/ACT AERS [id number]  Inhale 2 puffs into the lungs daily. Elnor [name], NP  Active   torsemide  (DEMADEX ) 20 MG tablet [id number]  Take 4 tablets (80 mg total)  by mouth daily. Elnor [name], NP  Active   triamcinolone  cream (KENALOG ) 0.5 % [id number]  Apply topically to legs twice daily for up to 2 weeks as needed for eczema Elnor [name], NP  Active Self, Pharmacy Records, Multiple Informants  Zoster Vaccine Adjuvanted (SHINGRIX ) injection [id number]  Inject into the muscle. Luiz Channel, MD  Active Self, Pharmacy Records              Assessment/Plan:   Diabetes: - Currently uncontrolled; goal A1c <7%. Cardiorenal risk reduction is opportunities for improvement.. Blood pressure is at goal <130/80. LDL is at goal.  - Continue Lantus , metformin , Farxiga  - Was unable to discuss medications in detail with patient as he was in a hurry - BG sound improved, planning to connect with his Dexcom data when he is in office tomorrow, if able. - Provided patient with a Dexcom G7 sensor sample today. PA team has submitted information for dexcom sensors today.   Hyperlipidemia/ASCVD Risk Reduction: - Currently uncontrolled (due to TG). LDL goal <70, TG goal <150 - Recommend to add Vascepa  1 g 2 capsules twice daily     Follow Up Plan: PCP f/u tomorrow 11/14  Darrelyn Drum, PharmD, BCPS,  CPP Clinical Pharmacist Practitioner [facility] [phone]

## 2024-04-25 NOTE — Telephone Encounter (Signed)
 Per insurance, a reconsideration request has been faxed to [phone] with documentation supporting insulin  use and diabetes diagnosis.

## 2024-04-26 ENCOUNTER — Ambulatory Visit (INDEPENDENT_AMBULATORY_CARE_PROVIDER_SITE_OTHER): Admitting: Nurse Practitioner

## 2024-04-26 VITALS — BP 104/62 | HR 85 | Temp 98.2°F | Ht 73.0 in | Wt 221.0 lb

## 2024-04-26 DIAGNOSIS — E785 Hyperlipidemia, unspecified: Secondary | ICD-10-CM | POA: Diagnosis not present

## 2024-04-26 DIAGNOSIS — E1169 Type 2 diabetes mellitus with other specified complication: Secondary | ICD-10-CM

## 2024-04-26 DIAGNOSIS — Z7984 Long term (current) use of oral hypoglycemic drugs: Secondary | ICD-10-CM

## 2024-04-26 DIAGNOSIS — R5383 Other fatigue: Secondary | ICD-10-CM

## 2024-04-26 DIAGNOSIS — E781 Pure hyperglyceridemia: Secondary | ICD-10-CM | POA: Diagnosis not present

## 2024-04-26 DIAGNOSIS — M10072 Idiopathic gout, left ankle and foot: Secondary | ICD-10-CM | POA: Diagnosis not present

## 2024-04-26 DIAGNOSIS — I5042 Chronic combined systolic (congestive) and diastolic (congestive) heart failure: Secondary | ICD-10-CM | POA: Diagnosis not present

## 2024-04-26 MED ORDER — TORSEMIDE 40 MG PO TABS: 40.0000 mg | ORAL_TABLET | Freq: Every day | ORAL | Status: AC

## 2024-04-26 MED ORDER — ICOSAPENT ETHYL 1 G PO CAPS
2.0000 g | ORAL_CAPSULE | Freq: Two times a day (BID) | ORAL | 3 refills | Status: AC
  Filled 2024-04-26: qty 120, 30d supply, fill #0

## 2024-04-26 NOTE — Assessment & Plan Note (Signed)
 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 Torsemide  reduced. Stable weight. Advised daily weight monitoring and Torsemide  adjustment. - Referred to rehabilitation for exercise program. - Monitor weight daily and continue Torsemide  40mg /day. Call if he notices 3lb weigh gain overnight or 5lb weight gain over a week - Encouraged smoking cessation and alcohol abstinence.

## 2024-04-26 NOTE — Telephone Encounter (Signed)
 Pt will be coming in today for office visit, I will be letting him be aware of this.

## 2024-04-26 NOTE — Assessment & Plan Note (Signed)
 Type 2 diabetes mellitus with diabetic polyneuropathy Blood sugars improving with diet. No hypoglycemia. Lantus  increased for better control. Discussed Ozempic restart based on triglycerides.  - Increased Lantus  to 12 units daily. Continue Farxiga  10mg /day and metformin  500mg  BD - Continue dietary management. - Check triglycerides at next visit for Ozempic eligibility. - Encouraged weight loss efforts.  Hyperlipidemia Triglycerides need reduction for Ozempic restart. Fish oil supplementation reintroduced. - Prescribed fish oil supplement, 2 tablets in the morning and 2 in the evening. - If prescription fish oil is too expensive, use over-the-counter fish oil supplement. - Check triglycerides at next visit.

## 2024-04-26 NOTE — Progress Notes (Signed)
 Established Patient Office Visit  Subjective   Patient ID: Luis [name], male    DOB: [birth year]  Age: 63 y.o. MRN: [id number]  Chief Complaint  Patient presents with   Diabetes    Discussed the use of AI scribe software for clinical note transcription with the patient, who gave verbal consent to proceed.  History of Present Illness Luis [name] is a 63 year old male with diabetes who presents for follow-up on blood sugar management and medication adjustments.  T2DM - Last A1C 11.1 - Currently treated with Farxiga  10mg /day, metformin  500MG  BID, and lantus  10 units/day - Blood glucose levels have been improving with dietary mindfulness. Using Dexcom CGM - No hypoglycemic episodes have occurred. - Blood sugar maintained between 70 and 180 mg/dL. - Hoping to start patient on Ozempic, but first we need to get triglycerides better controlled. Last lipid panel shows triglyceride level at 842. -Overall is feeling well - Continues to abstain from alcohol, trying to quit smoking  Combined systolic/Diastolic Heart Failure - Experiences fatigue with physical exertion - He is interested in working with PT to improve endurance - Reports stable dry weight of 215# over the last 2 weeks, has reduced torsemide  on his own from 80mg /day to 40mg /day. He is wondering if he can continue this dose.        ROS: see HPI    Objective:     BP 104/62   Pulse 85   Temp 98.2 F (36.8 C) (Temporal)   Ht 6' 1 (1.854 m)   Wt 221 lb (100.2 kg)   SpO2 97%   BMI 29.16 kg/m  BP Readings from Last 3 Encounters:  04/26/24 104/62  04/16/24 128/73  04/15/24 117/74   Wt Readings from Last 3 Encounters:  04/26/24 221 lb (100.2 kg)  04/05/24 229 lb 4 oz (104 kg)  03/18/24 232 lb 9.6 oz (105.5 kg)      Physical Exam Vitals reviewed.  Constitutional:      Appearance: Normal appearance.  HENT:     Head: Normocephalic and atraumatic.  Cardiovascular:     Rate and Rhythm: Normal rate and  regular rhythm.  Pulmonary:     Effort: Pulmonary effort is normal.     Breath sounds: Normal breath sounds.  Musculoskeletal:     Cervical back: Neck supple.  Skin:    General: Skin is warm and dry.  Neurological:     Mental Status: He is alert and oriented to person, place, and time.  Psychiatric:        Mood and Affect: Mood normal.        Behavior: Behavior normal.        Thought Content: Thought content normal.        Judgment: Judgment normal.      No results found for any visits on 04/26/24.    The 10-year ASCVD risk score (Arnett DK, et al., 2019) is: 32.4%    Assessment & Plan:   Problem List Items Addressed This Visit       Cardiovascular and Mediastinum   Chronic combined systolic and diastolic heart failure (HCC)   Relevant Medications   icosapent  Ethyl (VASCEPA ) 1 g capsule   torsemide  40 MG TABS   Other Relevant Orders   Ambulatory referral to Physical Therapy     Endocrine   Type 2 diabetes mellitus with hyperlipidemia (HCC)   Relevant Medications   icosapent  Ethyl (VASCEPA ) 1 g capsule   torsemide  40 MG TABS   Other Relevant Orders  Lipid panel   Other Visit Diagnoses       High triglycerides    -  Primary   Relevant Medications   icosapent  Ethyl (VASCEPA ) 1 g capsule   torsemide  40 MG TABS   Other Relevant Orders   Lipid panel     Fatigue, unspecified type       Relevant Orders   Ambulatory referral to Physical Therapy      Assessment and Plan Assessment & Plan Type 2 diabetes mellitus with diabetic polyneuropathy Blood sugars improving with diet. No hypoglycemia. Lantus  increased for better control. Discussed Ozempic restart based on triglycerides.  - Increased Lantus  to 12 units daily. Continue Farxiga  10mg /day and metformin  500mg  BD - Continue dietary management. - Check triglycerides at next visit for Ozempic eligibility. - Encouraged weight loss efforts.  Chronic combined systolic and diastolic heart failure with  hypertension Fatigue and limited exercise tolerance likely due to heart failure. Torsemide  reduced. Stable weight. Advised daily weight monitoring and Torsemide  adjustment. - Referred to rehabilitation for exercise program. - Monitor weight daily and continue Torsemide  40mg /day. Call if he notices 3lb weigh gain overnight or 5lb weight gain over a week - Encouraged smoking cessation and alcohol abstinence.  Hyperlipidemia Triglycerides need reduction for Ozempic restart. Fish oil supplementation reintroduced. - Prescribed fish oil supplement, 2 tablets in the morning and 2 in the evening. - If prescription fish oil is too expensive, use over-the-counter fish oil supplement. - Check triglycerides at next visit.    Return for as scheduled.    Lauraine FORBES Pereyra, NP

## 2024-04-27 LAB — URIC ACID: Uric Acid: 9.5 mg/dL — ABNORMAL HIGH (ref 3.8–8.4)

## 2024-04-29 ENCOUNTER — Ambulatory Visit (HOSPITAL_COMMUNITY)
Admission: RE | Admit: 2024-04-29 | Discharge: 2024-04-29 | Disposition: A | Discharge status: Home / Self Care | Source: Ambulatory Visit | Attending: Nurse Practitioner | Admitting: Nurse Practitioner

## 2024-04-29 ENCOUNTER — Ambulatory Visit: Payer: Self-pay | Admitting: Podiatry

## 2024-04-29 ENCOUNTER — Other Ambulatory Visit: Payer: Self-pay

## 2024-04-29 ENCOUNTER — Other Ambulatory Visit (HOSPITAL_COMMUNITY): Payer: Self-pay

## 2024-04-29 DIAGNOSIS — T8189XA Other complications of procedures, not elsewhere classified, initial encounter: Secondary | ICD-10-CM | POA: Insufficient documentation

## 2024-04-29 DIAGNOSIS — K469 Unspecified abdominal hernia without obstruction or gangrene: Secondary | ICD-10-CM | POA: Diagnosis not present

## 2024-04-29 DIAGNOSIS — K435 Parastomal hernia without obstruction or  gangrene: Secondary | ICD-10-CM

## 2024-04-29 DIAGNOSIS — Z932 Ileostomy status: Secondary | ICD-10-CM

## 2024-04-29 DIAGNOSIS — Y838 Other surgical procedures as the cause of abnormal reaction of the patient, or of later complication, without mention of misadventure at the time of the procedure: Secondary | ICD-10-CM | POA: Insufficient documentation

## 2024-04-29 DIAGNOSIS — L24B3 Irritant contact dermatitis related to fecal or urinary stoma or fistula: Secondary | ICD-10-CM

## 2024-04-29 DIAGNOSIS — Z432 Encounter for attention to ileostomy: Secondary | ICD-10-CM | POA: Diagnosis not present

## 2024-04-29 NOTE — Progress Notes (Signed)
 [facility]   Reason for visit:  RLQ ileostomy Nonhealing abdominal wound Ventral hernia HPI:  Perforated diverticulitis with end ileostomy  Past Medical History:  Diagnosis Date   Acute respiratory failure with hypoxia (HCC) 12/23/2022   Alcohol withdrawal syndrome, with delirium (HCC) 12/27/2022   COPD (chronic obstructive pulmonary disease) (HCC)    Diabetes mellitus without complication (HCC)    Diverticulitis    DVT (deep venous thrombosis) (HCC)    x3   Encephalopathy acute 12/28/2022   ETOH abuse    H/O colectomy    Hyperlipidemia    Hypertension    Sepsis associated hypotension (HCC) 08/03/2019   Smoker    Family History  Problem Relation Age of Onset   COPD Mother    Prostate cancer Father    Colon cancer Neg Hx    Rectal cancer Neg Hx    Stomach cancer Neg Hx    Esophageal cancer Neg Hx    Allergies  Allergen Reactions   Codeine Hives   Lisinopril Other (See Comments)    Other reaction(s): renal effects   Current Outpatient Medications  Medication Sig Dispense Refill Last Dose/Taking   acetaminophen  (TYLENOL ) 325 MG tablet Take 325-500 mg by mouth every 8 (eight) hours as needed for moderate pain (pain score 4-6).      allopurinol  (ZYLOPRIM ) 100 MG tablet Take 1 tablet (100 mg total) by mouth daily. 90 tablet 3    ascorbic acid  (VITAMIN C ) 500 MG tablet Take 1 tablet (500 mg total) by mouth daily. 30 tablet 1    atorvastatin  (LIPITOR) 10 MG tablet Take 1 tablet (10 mg total) by mouth daily. 90 tablet 3    cetirizine  (ZYRTEC ) 5 MG tablet Take 5 mg by mouth daily.      ciclopirox (PENLAC) 8 % solution Apply topically at bedtime over nail and surrounding skin. Apply daily over previous coat. After seven (7) days, remove with alcohol and continue cycle. 6.6 mL 2    colchicine  0.6 MG tablet Take 1 tablet (0.6 mg total) by mouth daily. 7 tablet 0    Continuous Glucose Receiver (DEXCOM G7 RECEIVER) [place] Use as directed. 1 each 0    Continuous Glucose  Sensor (DEXCOM G7 SENSOR) MISC Use as directed to monitor blood glucose, changing sensor every 10 days. 3 each 3    dapagliflozin  propanediol (FARXIGA ) 10 MG TABS tablet Take 1 tablet (10 mg total) by mouth daily before breakfast. 90 tablet 3    fluticasone  (FLOVENT  HFA) 110 MCG/ACT inhaler Inhale 1 puff into the lungs in the morning and at bedtime. 12 g 2    folic acid  (FOLVITE ) 1 MG tablet Take 1 tablet (1 mg total) by mouth daily. 90 tablet 0    gabapentin  (NEURONTIN ) 300 MG capsule Take 2 capsules (600 mg total) by mouth 3 (three) times daily. 360 capsule 3    guaiFENesin  (MUCINEX ) 600 MG 12 hr tablet Take 1 tablet (600 mg total) by mouth 2 (two) times daily as needed to loosen phlegm or cough. 180 tablet 1    hydrOXYzine  (ATARAX ) 25 MG tablet Take 25 mg by mouth at bedtime as needed.      icosapent  Ethyl (VASCEPA ) 1 g capsule Take 2 capsules (2 g total) by mouth 2 (two) times daily. 120 capsule 3    insulin  glargine (LANTUS  SOLOSTAR) 100 UNIT/ML Solostar Pen Inject 10 Units into the skin daily. 15 mL 1    Insulin  Pen Needle 32G X 4 MM MISC Use once  daily to administer insulin  100 each 1    ipratropium-albuterol  (DUONEB) 0.5-2.5 (3) MG/3ML SOLN Take 3 mLs by nebulization every 6 (six) hours as needed (wheezing / sob).      losartan  (COZAAR ) 25 MG tablet Take 1 tablet (25 mg total) by mouth daily. 90 tablet 3    metFORMIN  (GLUCOPHAGE ) 500 MG tablet Take 1 tablet (500 mg total) by mouth 2 (two) times daily with a meal. 180 tablet 0    metoprolol  succinate (TOPROL -XL) 25 MG 24 hr tablet Take 1 tablet (25 mg total) by mouth daily. 90 tablet 3    Semaglutide,0.25 or 0.5MG /DOS, (OZEMPIC, 0.25 OR 0.5 MG/DOSE,) 2 MG/3ML SOPN Inject 0.25 mg into the skin once a week. (Patient not taking: Reported on 04/26/2024) 3 mL 1    Tiotropium Bromide -Olodaterol 2.5-2.5 MCG/ACT AERS Inhale 2 puffs into the lungs daily. 4 g 3    torsemide  40 MG TABS Take 40 mg by mouth daily.      triamcinolone  cream (KENALOG ) 0.5 %  Apply topically to legs twice daily for up to 2 weeks as needed for eczema 30 g 1    Zoster Vaccine Adjuvanted (SHINGRIX ) injection Inject into the muscle. 1 each 1    No current facility-administered medications for this encounter.   ROS  Review of Systems  Constitutional: Negative.   HENT:  Positive for dental problem and sneezing.   Eyes: Negative.   Respiratory:  Positive for shortness of breath.   Gastrointestinal:        Ileostomy    Skin:  Positive for rash and wound (nonhealing abdominal wound).  Neurological:  Positive for numbness.       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  Vital signs:  BP 118/72   Pulse 66   Resp (!) 22   SpO2 94%  Exam:  Physical Exam Vitals reviewed.  Cardiovascular:     Rate and Rhythm: Normal rate.  Pulmonary:     Comments: COPD - shortness of breath with walking.  Uses wheelchair in office. Abdominal:     Hernia: A hernia (ventral hernia without obstruction) is present.  Neurological:     Mental Status: He is alert.     Stoma type/location:  RLQ ileostomy  Stomal assessment/size:  1 flush, pink productive  Peristomal assessment:  Hernia, irritant dermatitis Nonhealing midline abdominal wounds Treatment options for stomal/peristomal skin: Stoma powder and skin prep  barrier ring and 1 piece flat pouch (patient preference)  Output: soft yellow stool Ostomy pouching: 1pc. With barrier ring Education provided:  Patient changes pouch and wound dressings 2-3 times weekly.  He showers when he does this and washes abdomen with VASHE and allows to air dry.     Impression/dx  Ileostomy Hernia Nonhealing wound Discussion  Performed wound care with aquacel AG to open draining wounds, cover with gauze/ABD pads and minimal tape.  Plan  Most skin irritation is due to tape.  We have tried various forms of tape and this results in the least amount of medical adhesive related skin injury. Skin is stretched due to  hernia and slows progression of wound healing.     Visit time: 55 minutes.   Darice Cooley FNP-BC

## 2024-04-30 ENCOUNTER — Other Ambulatory Visit: Payer: Self-pay

## 2024-04-30 NOTE — Telephone Encounter (Signed)
 General Message:  Reason for call:   Pt called to check status of referral. Please f/u with pt  Best Number to Call:     [phone]  [facility] Name (if not patient)

## 2024-05-01 NOTE — Telephone Encounter (Signed)
 General Message:  Reason for call:   call is being recorded- patient called in requesting status of his referral since Mon 11/17, says this has been ongoing for months, he is not sure why no one is calling him back, advise it is up to referring office to notify him of things, he stated that [facility] sent over records, images, etc on Friday 11/14, patient is requesting to know what is it that GS is missing so that he can be scheduled, please advise referring office or patient so that he is aware.  Best Number to Call:     [phone]  [facility] Name (if not patient)

## 2024-05-02 ENCOUNTER — Other Ambulatory Visit (HOSPITAL_COMMUNITY): Payer: Self-pay

## 2024-05-02 DIAGNOSIS — Z932 Ileostomy status: Secondary | ICD-10-CM | POA: Diagnosis not present

## 2024-05-02 DIAGNOSIS — S31109A Unspecified open wound of abdominal wall, unspecified quadrant without penetration into peritoneal cavity, initial encounter: Secondary | ICD-10-CM | POA: Diagnosis not present

## 2024-05-03 ENCOUNTER — Other Ambulatory Visit (HOSPITAL_COMMUNITY): Payer: Self-pay

## 2024-05-06 ENCOUNTER — Ambulatory Visit (HOSPITAL_COMMUNITY)
Admission: RE | Admit: 2024-05-06 | Discharge: 2024-05-06 | Disposition: A | Discharge status: Home / Self Care | Source: Ambulatory Visit | Attending: Podiatry | Admitting: Podiatry

## 2024-05-06 DIAGNOSIS — I739 Peripheral vascular disease, unspecified: Secondary | ICD-10-CM | POA: Diagnosis not present

## 2024-05-06 LAB — VAS US ABI WITH/WO TBI
Left ABI: 0.67
Right ABI: 0.96

## 2024-05-07 ENCOUNTER — Ambulatory Visit (HOSPITAL_COMMUNITY)
Admission: RE | Admit: 2024-05-07 | Discharge: 2024-05-07 | Disposition: A | Discharge status: Home / Self Care | Source: Ambulatory Visit | Attending: *Deleted | Admitting: *Deleted

## 2024-05-07 DIAGNOSIS — K439 Ventral hernia without obstruction or gangrene: Secondary | ICD-10-CM | POA: Diagnosis not present

## 2024-05-07 DIAGNOSIS — L24B3 Irritant contact dermatitis related to fecal or urinary stoma or fistula: Secondary | ICD-10-CM

## 2024-05-07 DIAGNOSIS — Z932 Ileostomy status: Secondary | ICD-10-CM | POA: Diagnosis not present

## 2024-05-07 DIAGNOSIS — J449 Chronic obstructive pulmonary disease, unspecified: Secondary | ICD-10-CM | POA: Diagnosis not present

## 2024-05-07 DIAGNOSIS — K435 Parastomal hernia without obstruction or  gangrene: Secondary | ICD-10-CM | POA: Diagnosis not present

## 2024-05-07 DIAGNOSIS — K9419 Other complications of enterostomy: Secondary | ICD-10-CM | POA: Insufficient documentation

## 2024-05-07 DIAGNOSIS — E1140 Type 2 diabetes mellitus with diabetic neuropathy, unspecified: Secondary | ICD-10-CM | POA: Insufficient documentation

## 2024-05-07 NOTE — Progress Notes (Signed)
 [facility]   Reason for visit:  RLQ ileostomy  Irritant contact dermatitis Nonhealing abdominal wound Ventral hernia  HPI:  Perforated diverticulitis Past Medical History:  Diagnosis Date   Acute respiratory failure with hypoxia (HCC) 12/23/2022   Alcohol withdrawal syndrome, with delirium (HCC) 12/27/2022   COPD (chronic obstructive pulmonary disease) (HCC)    Diabetes mellitus without complication (HCC)    Diverticulitis    DVT (deep venous thrombosis) (HCC)    x3   Encephalopathy acute 12/28/2022   ETOH abuse    H/O colectomy    Hyperlipidemia    Hypertension    Sepsis associated hypotension (HCC) 08/03/2019   Smoker    Family History  Problem Relation Age of Onset   COPD Mother    Prostate cancer Father    Colon cancer Neg Hx    Rectal cancer Neg Hx    Stomach cancer Neg Hx    Esophageal cancer Neg Hx    Allergies  Allergen Reactions   Codeine Hives   Lisinopril Other (See Comments)    Other reaction(s): renal effects   Current Outpatient Medications  Medication Sig Dispense Refill Last Dose/Taking   acetaminophen  (TYLENOL ) 325 MG tablet Take 325-500 mg by mouth every 8 (eight) hours as needed for moderate pain (pain score 4-6).      allopurinol  (ZYLOPRIM ) 100 MG tablet Take 1 tablet (100 mg total) by mouth daily. 90 tablet 3    ascorbic acid  (VITAMIN C ) 500 MG tablet Take 1 tablet (500 mg total) by mouth daily. 30 tablet 1    atorvastatin  (LIPITOR) 10 MG tablet Take 1 tablet (10 mg total) by mouth daily. 90 tablet 3    cetirizine  (ZYRTEC ) 5 MG tablet Take 5 mg by mouth daily.      ciclopirox  (PENLAC ) 8 % solution Apply topically at bedtime over nail and surrounding skin. Apply daily over previous coat. After seven (7) days, remove with alcohol and continue cycle. 6.6 mL 2    colchicine  0.6 MG tablet Take 1 tablet (0.6 mg total) by mouth daily. 7 tablet 0    Continuous Glucose Receiver (DEXCOM G7 RECEIVER) DEVI Use as directed. 1 each 0     Continuous Glucose Sensor (DEXCOM G7 SENSOR) MISC Use as directed to monitor blood glucose, changing sensor every 10 days. 3 each 3    dapagliflozin  propanediol (FARXIGA ) 10 MG TABS tablet Take 1 tablet (10 mg total) by mouth daily before breakfast. 90 tablet 3    fluticasone  (FLOVENT  HFA) 110 MCG/ACT inhaler Inhale 1 puff into the lungs in the morning and at bedtime. 12 g 2    folic acid  (FOLVITE ) 1 MG tablet Take 1 tablet (1 mg total) by mouth daily. 90 tablet 0    gabapentin  (NEURONTIN ) 300 MG capsule Take 2 capsules (600 mg total) by mouth 3 (three) times daily. 360 capsule 3    guaiFENesin  (MUCINEX ) 600 MG 12 hr tablet Take 1 tablet (600 mg total) by mouth 2 (two) times daily as needed to loosen phlegm or cough. 180 tablet 1    hydrOXYzine  (ATARAX ) 25 MG tablet Take 25 mg by mouth at bedtime as needed.      icosapent  Ethyl (VASCEPA ) 1 g capsule Take 2 capsules (2 g total) by mouth 2 (two) times daily. 120 capsule 3    insulin  glargine (LANTUS  SOLOSTAR) 100 UNIT/ML Solostar Pen Inject 10 Units into the skin daily. 15 mL 1    Insulin  Pen Needle 32G X 4 MM MISC Use  once daily to administer insulin  100 each 1    ipratropium-albuterol  (DUONEB) 0.5-2.5 (3) MG/3ML SOLN Take 3 mLs by nebulization every 6 (six) hours as needed (wheezing / sob).      losartan  (COZAAR ) 25 MG tablet Take 1 tablet (25 mg total) by mouth daily. 90 tablet 3    metFORMIN  (GLUCOPHAGE ) 500 MG tablet Take 1 tablet (500 mg total) by mouth 2 (two) times daily with a meal. 180 tablet 0    metoprolol  succinate (TOPROL -XL) 25 MG 24 hr tablet Take 1 tablet (25 mg total) by mouth daily. 90 tablet 3    Semaglutide ,0.25 or 0.5MG /DOS, (OZEMPIC , 0.25 OR 0.5 MG/DOSE,) 2 MG/3ML SOPN Inject 0.25 mg into the skin once a week. (Patient not taking: Reported on 04/26/2024) 3 mL 1    Tiotropium Bromide -Olodaterol 2.5-2.5 MCG/ACT AERS Inhale 2 puffs into the lungs daily. 4 g 3    torsemide  40 MG TABS Take 40 mg by mouth daily.      triamcinolone   cream (KENALOG ) 0.5 % Apply topically to legs twice daily for up to 2 weeks as needed for eczema 30 g 1    Zoster Vaccine Adjuvanted (SHINGRIX ) injection Inject into the muscle. 1 each 1    No current facility-administered medications for this encounter.   ROS  Review of Systems  Respiratory:  Positive for shortness of breath.        COPD Recent pneumonia  Gastrointestinal: Negative.        Ileostomy   Endocrine:       Diabetes, type II  Musculoskeletal:        Uses wheelchair   Skin:  Positive for color change and wound.  Neurological:  Positive for numbness.       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  Vital signs:  BP (!) 145/75   Pulse 63   Temp (!) 97.4 F (36.3 C) (Oral)   Resp 18   SpO2 97%  Exam:  Physical Exam Vitals reviewed.  Constitutional:      Appearance: He is obese.  HENT:     Mouth/Throat:     Mouth: Mucous membranes are moist.  Cardiovascular:     Rate and Rhythm: Normal rate.     Pulses: Normal pulses.  Pulmonary:     Breath sounds: Wheezing present.     Comments: Shortness of breath with activity  Abdominal:     Hernia: A hernia is present.  Skin:    Findings: Lesion and rash present.     Comments: NOnhealing abdominal wound MEdical adhesive related skin skin injury to abdomen from wound dressings.   Neurological:     Mental Status: He is alert and oriented to person, place, and time. Mental status is at baseline.  Psychiatric:        Behavior: Behavior normal.     Stoma type/location:  RLQ ileostomy  Stomal assessment/size:  1 flush  Peristomal assessment:  irritant dermatitis  Treatment options for stomal/peristomal skin: barrier ring and 1 piece flat Output: liquid yellow stool  Ostomy pouching: 1pc. Education provided:  Has upcoming appointment with [place] surgeon to discuss reversal/hernia repair.  He has stopped drinking alcohol.  Trying to stop smoking     Impression/dx  Ileostomy Ventral hernia   Nonhealing abdominal wounds Discussion  Wound care performed Aquacel ag to open wounds Washing wounds/skin with VASHE Cover with gauze/abd pads and tape.  Plan  See back as needed  I personally spent a total of  50 minutes in the care of the patient today including preparing to see the patient, getting/reviewing separately obtained history, performing a medically appropriate exam/evaluation, counseling and educating, placing orders, referring and communicating with other health care professionals, documenting clinical information in the EHR, communicating results, and coordinating care.   Visit time: 50 minutes.   Darice Cooley FNP-BC

## 2024-05-07 NOTE — Discharge Instructions (Signed)
 Tuesday 12/2 at 11:15

## 2024-05-08 ENCOUNTER — Other Ambulatory Visit (HOSPITAL_COMMUNITY): Payer: Self-pay

## 2024-05-08 ENCOUNTER — Other Ambulatory Visit: Payer: Self-pay | Admitting: Podiatry

## 2024-05-08 ENCOUNTER — Telehealth: Payer: Self-pay

## 2024-05-08 DIAGNOSIS — I739 Peripheral vascular disease, unspecified: Secondary | ICD-10-CM

## 2024-05-08 NOTE — Telephone Encounter (Signed)
 error

## 2024-05-10 ENCOUNTER — Other Ambulatory Visit (HOSPITAL_COMMUNITY): Payer: Self-pay

## 2024-05-13 ENCOUNTER — Other Ambulatory Visit (HOSPITAL_COMMUNITY): Payer: Self-pay

## 2024-05-13 ENCOUNTER — Other Ambulatory Visit: Payer: Self-pay

## 2024-05-13 ENCOUNTER — Telehealth: Payer: Self-pay

## 2024-05-13 NOTE — Telephone Encounter (Signed)
 Sending to PA team, to initiate PA for Dexcom sensor.

## 2024-05-14 ENCOUNTER — Telehealth: Payer: Self-pay

## 2024-05-14 ENCOUNTER — Ambulatory Visit (HOSPITAL_COMMUNITY)
Admission: RE | Admit: 2024-05-14 | Discharge: 2024-05-14 | Disposition: A | Source: Ambulatory Visit | Attending: *Deleted | Admitting: *Deleted

## 2024-05-14 DIAGNOSIS — K439 Ventral hernia without obstruction or gangrene: Secondary | ICD-10-CM | POA: Insufficient documentation

## 2024-05-14 DIAGNOSIS — L24B3 Irritant contact dermatitis related to fecal or urinary stoma or fistula: Secondary | ICD-10-CM | POA: Diagnosis not present

## 2024-05-14 DIAGNOSIS — K435 Parastomal hernia without obstruction or  gangrene: Secondary | ICD-10-CM

## 2024-05-14 DIAGNOSIS — Z432 Encounter for attention to ileostomy: Secondary | ICD-10-CM | POA: Diagnosis not present

## 2024-05-14 NOTE — Progress Notes (Signed)
 [facility]   Reason for visit:  RLQ ileostomy  nonhealing abdominal wound  ventral hernia HPI:  Perforated diverticulitis with end ileostomy Past Medical History:  Diagnosis Date   Acute respiratory failure with hypoxia (HCC) 12/23/2022   Alcohol withdrawal syndrome, with delirium (HCC) 12/27/2022   COPD (chronic obstructive pulmonary disease) (HCC)    Diabetes mellitus without complication (HCC)    Diverticulitis    DVT (deep venous thrombosis) (HCC)    x3   Encephalopathy acute 12/28/2022   ETOH abuse    H/O colectomy    Hyperlipidemia    Hypertension    Sepsis associated hypotension (HCC) 08/03/2019   Smoker    Family History  Problem Relation Age of Onset   COPD Mother    Prostate cancer Father    Colon cancer Neg Hx    Rectal cancer Neg Hx    Stomach cancer Neg Hx    Esophageal cancer Neg Hx    Allergies  Allergen Reactions   Codeine Hives   Lisinopril Other (See Comments)    Other reaction(s): renal effects   Current Outpatient Medications  Medication Sig Dispense Refill Last Dose/Taking   acetaminophen  (TYLENOL ) 325 MG tablet Take 325-500 mg by mouth every 8 (eight) hours as needed for moderate pain (pain score 4-6).      allopurinol  (ZYLOPRIM ) 100 MG tablet Take 1 tablet (100 mg total) by mouth daily. 90 tablet 3    ascorbic acid  (VITAMIN C ) 500 MG tablet Take 1 tablet (500 mg total) by mouth daily. 30 tablet 1    atorvastatin  (LIPITOR) 10 MG tablet Take 1 tablet (10 mg total) by mouth daily. 90 tablet 3    cetirizine  (ZYRTEC ) 5 MG tablet Take 5 mg by mouth daily.      ciclopirox  (PENLAC ) 8 % solution Apply topically at bedtime over nail and surrounding skin. Apply daily over previous coat. After seven (7) days, remove with alcohol and continue cycle. 6.6 mL 2    colchicine  0.6 MG tablet Take 1 tablet (0.6 mg total) by mouth daily. 7 tablet 0    Continuous Glucose Receiver (DEXCOM G7 RECEIVER) DEVI Use as directed. 1 each 0    Continuous Glucose  Sensor (DEXCOM G7 SENSOR) MISC Use as directed to monitor blood glucose, changing sensor every 10 days. 3 each 3    dapagliflozin  propanediol (FARXIGA ) 10 MG TABS tablet Take 1 tablet (10 mg total) by mouth daily before breakfast. 90 tablet 3    fluticasone  (FLOVENT  HFA) 110 MCG/ACT inhaler Inhale 1 puff into the lungs in the morning and at bedtime. 12 g 2    folic acid  (FOLVITE ) 1 MG tablet Take 1 tablet (1 mg total) by mouth daily. 90 tablet 0    gabapentin  (NEURONTIN ) 300 MG capsule Take 2 capsules (600 mg total) by mouth 3 (three) times daily. 360 capsule 3    guaiFENesin  (MUCINEX ) 600 MG 12 hr tablet Take 1 tablet (600 mg total) by mouth 2 (two) times daily as needed to loosen phlegm or cough. 180 tablet 1    hydrOXYzine  (ATARAX ) 25 MG tablet Take 25 mg by mouth at bedtime as needed.      icosapent  Ethyl (VASCEPA ) 1 g capsule Take 2 capsules (2 g total) by mouth 2 (two) times daily. 120 capsule 3    insulin  glargine (LANTUS  SOLOSTAR) 100 UNIT/ML Solostar Pen Inject 10 Units into the skin daily. 15 mL 1    Insulin  Pen Needle 32G X 4 MM MISC Use  once daily to administer insulin  100 each 1    ipratropium-albuterol  (DUONEB) 0.5-2.5 (3) MG/3ML SOLN Take 3 mLs by nebulization every 6 (six) hours as needed (wheezing / sob).      losartan  (COZAAR ) 25 MG tablet Take 1 tablet (25 mg total) by mouth daily. 90 tablet 3    metFORMIN  (GLUCOPHAGE ) 500 MG tablet Take 1 tablet (500 mg total) by mouth 2 (two) times daily with a meal. 180 tablet 0    metoprolol  succinate (TOPROL -XL) 25 MG 24 hr tablet Take 1 tablet (25 mg total) by mouth daily. 90 tablet 3    Semaglutide ,0.25 or 0.5MG /DOS, (OZEMPIC , 0.25 OR 0.5 MG/DOSE,) 2 MG/3ML SOPN Inject 0.25 mg into the skin once a week. (Patient not taking: Reported on 04/26/2024) 3 mL 1    Tiotropium Bromide -Olodaterol 2.5-2.5 MCG/ACT AERS Inhale 2 puffs into the lungs daily. 4 g 3    torsemide  40 MG TABS Take 40 mg by mouth daily.      triamcinolone  cream (KENALOG ) 0.5 %  Apply topically to legs twice daily for up to 2 weeks as needed for eczema 30 g 1    Zoster Vaccine Adjuvanted (SHINGRIX ) injection Inject into the muscle. 1 each 1    No current facility-administered medications for this encounter.   ROS  Review of Systems  Constitutional:  Positive for fatigue (Reports he is tired alot).  Respiratory:  Positive for shortness of breath.        COPD Hx respiratory failure  Cardiovascular:        Hypertension pacemaker  Gastrointestinal:        Ileostomy  Musculoskeletal:  Positive for gait problem.  Skin:  Positive for color change, rash and wound.  Neurological:        Neuropathy   Vital signs:  BP 132/79   Pulse 72   Temp 98.4 F (36.9 C) (Oral)   Resp 18   SpO2 100%  Exam:  Physical Exam Vitals reviewed.  Cardiovascular:     Rate and Rhythm: Normal rate.     Pulses: Normal pulses.  Pulmonary:     Comments: Shortness of breath with activity  Abdominal:     Hernia: A hernia is present.  Skin:    General: Skin is warm and dry.     Findings: Erythema, lesion and rash present.  Neurological:     Mental Status: He is alert and oriented to person, place, and time. Mental status is at baseline.  Psychiatric:        Mood and Affect: Mood normal.     Stoma type/location:  RLQ ileostomy  Stomal assessment/size:  1 1/4 flush pink and moist  Peristomal assessment:  abdominal hernia  Treatment options for stomal/peristomal skin: 1 piece flat pouch with barrier ring  Output: liquid tan stool Ostomy pouching: 1pc.flat  Education provided:  wound care provided.  Aquacel to open wounds.  Wash periwound with VASHE.  He is being seen by consulting complex surgery team this week. He is apprehensive and hopeful he is a candidate for intervention.  We discuss optimizing his chances for success.  He has stopped drinking alcohol and has reduced the amount of nicotine  he uses.      Impression/dx  Irritant contact dermatitis Abdominal  hernia Ileostomy Discussion   See back as needed.   Plan  Consult with new surgeon regarding surgical intervention.  I personally spent a total of 50 minutes in the care of the patient today including preparing to see the patient,  getting/reviewing separately obtained history, performing a medically appropriate exam/evaluation, counseling and educating, referring and communicating with other health care professionals, documenting clinical information in the EHR, and coordinating care.     Visit time: 45 minutes.   Darice Cooley FNP-BC

## 2024-05-14 NOTE — Telephone Encounter (Signed)
 Copied from CRM [id number]. Topic: General - Other >> May 13, 2024  4:34 PM Drema MATSU wrote: Reason for CRM: Patient is requesting a sensor applicator from the office that needs to be changed every 10 days until prior auth is approved and when they send it to his house. He is needing a sample for 30 days. Please call patient back. He said that he didn't get a call.

## 2024-05-15 ENCOUNTER — Other Ambulatory Visit (HOSPITAL_COMMUNITY): Payer: Self-pay

## 2024-05-15 ENCOUNTER — Other Ambulatory Visit: Payer: Self-pay | Admitting: Nurse Practitioner

## 2024-05-15 NOTE — Telephone Encounter (Addendum)
 Unable to leave voice mail due to it not being set up.   Samples are available

## 2024-05-15 NOTE — Telephone Encounter (Signed)
 Patient has since visited office and provided 3 Dexcom G7 samples

## 2024-05-16 ENCOUNTER — Other Ambulatory Visit (HOSPITAL_COMMUNITY): Payer: Self-pay

## 2024-05-16 ENCOUNTER — Other Ambulatory Visit: Payer: Self-pay

## 2024-05-16 MED ORDER — FOLIC ACID 1 MG PO TABS
1.0000 mg | ORAL_TABLET | Freq: Every day | ORAL | 0 refills | Status: AC
  Filled 2024-05-16: qty 90, 90d supply, fill #0

## 2024-05-17 ENCOUNTER — Other Ambulatory Visit: Payer: Self-pay

## 2024-05-17 ENCOUNTER — Telehealth: Payer: Self-pay

## 2024-05-17 NOTE — Telephone Encounter (Signed)
 Patient's Qutenza prescription needs to be sent to [facility].

## 2024-05-20 ENCOUNTER — Ambulatory Visit (HOSPITAL_COMMUNITY): Admitting: Nurse Practitioner

## 2024-05-20 ENCOUNTER — Other Ambulatory Visit: Payer: Self-pay | Admitting: Podiatry

## 2024-05-20 MED ORDER — QUTENZA (4 PATCH) 8 % EX KIT: 4.0000 | PACK | Freq: Once | CUTANEOUS | 0 refills | Status: AC

## 2024-05-21 ENCOUNTER — Other Ambulatory Visit (HOSPITAL_COMMUNITY): Payer: Self-pay

## 2024-05-21 ENCOUNTER — Encounter: Payer: Self-pay | Admitting: Nurse Practitioner

## 2024-05-22 ENCOUNTER — Other Ambulatory Visit (HOSPITAL_COMMUNITY): Payer: Self-pay | Admitting: Nurse Practitioner

## 2024-05-22 ENCOUNTER — Other Ambulatory Visit (HOSPITAL_COMMUNITY): Payer: Self-pay

## 2024-05-23 ENCOUNTER — Other Ambulatory Visit: Payer: Self-pay

## 2024-05-23 ENCOUNTER — Other Ambulatory Visit (HOSPITAL_COMMUNITY): Payer: Self-pay

## 2024-05-23 ENCOUNTER — Other Ambulatory Visit: Payer: Self-pay | Admitting: Nurse Practitioner

## 2024-05-23 DIAGNOSIS — E119 Type 2 diabetes mellitus without complications: Secondary | ICD-10-CM | POA: Diagnosis not present

## 2024-05-23 DIAGNOSIS — F17210 Nicotine dependence, cigarettes, uncomplicated: Secondary | ICD-10-CM | POA: Diagnosis not present

## 2024-05-23 DIAGNOSIS — Z794 Long term (current) use of insulin: Secondary | ICD-10-CM | POA: Diagnosis not present

## 2024-05-23 DIAGNOSIS — K439 Ventral hernia without obstruction or gangrene: Secondary | ICD-10-CM | POA: Diagnosis not present

## 2024-05-23 DIAGNOSIS — Z932 Ileostomy status: Secondary | ICD-10-CM | POA: Diagnosis not present

## 2024-05-23 DIAGNOSIS — I5042 Chronic combined systolic (congestive) and diastolic (congestive) heart failure: Secondary | ICD-10-CM

## 2024-05-23 DIAGNOSIS — T148XXA Other injury of unspecified body region, initial encounter: Secondary | ICD-10-CM | POA: Diagnosis not present

## 2024-05-23 DIAGNOSIS — L089 Local infection of the skin and subcutaneous tissue, unspecified: Secondary | ICD-10-CM | POA: Diagnosis not present

## 2024-05-23 DIAGNOSIS — S31109A Unspecified open wound of abdominal wall, unspecified quadrant without penetration into peritoneal cavity, initial encounter: Secondary | ICD-10-CM | POA: Diagnosis not present

## 2024-05-23 MED ORDER — METOPROLOL SUCCINATE ER 25 MG PO TB24
25.0000 mg | ORAL_TABLET | Freq: Every day | ORAL | 3 refills | Status: AC
  Filled 2024-05-23: qty 90, 90d supply, fill #0

## 2024-05-24 ENCOUNTER — Other Ambulatory Visit (HOSPITAL_COMMUNITY): Payer: Self-pay

## 2024-05-24 ENCOUNTER — Other Ambulatory Visit: Payer: Self-pay | Admitting: Nurse Practitioner

## 2024-05-24 ENCOUNTER — Telehealth: Payer: Self-pay

## 2024-05-24 NOTE — Telephone Encounter (Signed)
Patient picked up sensor today.

## 2024-05-24 NOTE — Telephone Encounter (Signed)
 PA request received from [facility]. PA submitted through CoverMyMeds and waiting on response.  [name] (Key: [id number])

## 2024-05-27 NOTE — Telephone Encounter (Signed)
 Patient's Qutenza  PA was denied due to the fact patient has not tried 2 preferred drugs. Preferred drug list include duloxetine, lidocaine  patch, pregabalin capsule or solution. Also Gabapentin  capsule, solution or tablet (which they have record of patient using).  I have also placed this form in your folder.

## 2024-05-28 ENCOUNTER — Other Ambulatory Visit: Payer: Self-pay | Admitting: Podiatry

## 2024-05-28 DIAGNOSIS — M792 Neuralgia and neuritis, unspecified: Secondary | ICD-10-CM

## 2024-05-30 ENCOUNTER — Other Ambulatory Visit (HOSPITAL_COMMUNITY): Payer: Self-pay

## 2024-05-31 ENCOUNTER — Other Ambulatory Visit: Payer: Self-pay

## 2024-05-31 ENCOUNTER — Ambulatory Visit: Admitting: Nurse Practitioner

## 2024-05-31 ENCOUNTER — Other Ambulatory Visit (HOSPITAL_COMMUNITY): Payer: Self-pay

## 2024-05-31 VITALS — BP 126/78 | HR 60 | Temp 98.3°F | Ht 73.0 in | Wt 214.0 lb

## 2024-05-31 DIAGNOSIS — T8189XA Other complications of procedures, not elsewhere classified, initial encounter: Secondary | ICD-10-CM | POA: Diagnosis not present

## 2024-05-31 DIAGNOSIS — E781 Pure hyperglyceridemia: Secondary | ICD-10-CM | POA: Diagnosis not present

## 2024-05-31 DIAGNOSIS — E785 Hyperlipidemia, unspecified: Secondary | ICD-10-CM

## 2024-05-31 DIAGNOSIS — E1169 Type 2 diabetes mellitus with other specified complication: Secondary | ICD-10-CM

## 2024-05-31 DIAGNOSIS — Z7984 Long term (current) use of oral hypoglycemic drugs: Secondary | ICD-10-CM | POA: Diagnosis not present

## 2024-05-31 DIAGNOSIS — J449 Chronic obstructive pulmonary disease, unspecified: Secondary | ICD-10-CM | POA: Diagnosis not present

## 2024-05-31 LAB — LIPID PANEL
Cholesterol: 114 mg/dL (ref 28–200)
HDL: 24 mg/dL — ABNORMAL LOW
LDL Cholesterol: 23 mg/dL (ref 10–99)
NonHDL: 90.17
Total CHOL/HDL Ratio: 5
Triglycerides: 334 mg/dL — ABNORMAL HIGH (ref 10.0–149.0)
VLDL: 66.8 mg/dL — ABNORMAL HIGH (ref 0.0–40.0)

## 2024-05-31 LAB — COMPREHENSIVE METABOLIC PANEL WITH GFR
ALT: 39 U/L (ref 3–53)
AST: 26 U/L (ref 5–37)
Albumin: 4.6 g/dL (ref 3.5–5.2)
Alkaline Phosphatase: 254 U/L — ABNORMAL HIGH (ref 39–117)
BUN: 42 mg/dL — ABNORMAL HIGH (ref 6–23)
CO2: 29 meq/L (ref 19–32)
Calcium: 10.6 mg/dL — ABNORMAL HIGH (ref 8.4–10.5)
Chloride: 102 meq/L (ref 96–112)
Creatinine, Ser: 1.46 mg/dL (ref 0.40–1.50)
GFR: 50.4 mL/min — ABNORMAL LOW
Glucose, Bld: 132 mg/dL — ABNORMAL HIGH (ref 70–99)
Potassium: 4.3 meq/L (ref 3.5–5.1)
Sodium: 138 meq/L (ref 135–145)
Total Bilirubin: 0.6 mg/dL (ref 0.2–1.2)
Total Protein: 8.5 g/dL — ABNORMAL HIGH (ref 6.0–8.3)

## 2024-05-31 LAB — CBC
HCT: 41.2 % (ref 39.0–52.0)
Hemoglobin: 13.6 g/dL (ref 13.0–17.0)
MCHC: 33.1 g/dL (ref 30.0–36.0)
MCV: 98.7 fl (ref 78.0–100.0)
Platelets: 259 K/uL (ref 150.0–400.0)
RBC: 4.17 Mil/uL — ABNORMAL LOW (ref 4.22–5.81)
RDW: 15 % (ref 11.5–15.5)
WBC: 12.4 K/uL — ABNORMAL HIGH (ref 4.0–10.5)

## 2024-05-31 LAB — HEMOGLOBIN A1C: Hgb A1c MFr Bld: 8.7 % — ABNORMAL HIGH (ref 4.6–6.5)

## 2024-05-31 MED ORDER — ICOSAPENT ETHYL 1 G PO CAPS
2.0000 g | ORAL_CAPSULE | Freq: Two times a day (BID) | ORAL | 3 refills | Status: AC
  Filled 2024-05-31: qty 120, 30d supply, fill #0
  Filled 2024-07-07: qty 120, 30d supply, fill #1

## 2024-05-31 NOTE — Progress Notes (Signed)
 "  Established Patient Office Visit  Subjective   Patient ID: Luis [name], male    DOB: 1960/08/26  Age: 63 y.o. MRN: [id number]  Chief Complaint  Patient presents with   Diabetes    Discussed the use of AI scribe software for clinical note transcription with the patient, who gave verbal consent to proceed.  History of Present Illness Luis [name] is a 63 year old male with uncontrolled type 2 diabetes who presents for follow-up and lipid panel recheck.   T2DM with associated hyperlipidemia - Uncontrolled type 2 diabetes mellitus. - Significant weight loss attributed to dietary changes and closer monitoring of sugar intake. - Occasional consumption of ice cream and pie. - Uses Dexcom for continuous glucose monitoring and feels glycemic control has improved. - Current diabetes medications: Lantus  10 units daily, Farxiga  10 mg daily, Metformin  500 mg twice a day. - Last A1C 11.1  Hyperlipidemia management - Continues atorvastatin  10 mg daily. - Continues Vascepa  2 grams twice a day.  Pulmonary history and referral - COPD with history of pneumonia - Desires pulmonary rehabilitation referral that was not completed previously.      ROS: see HPI    Objective:     BP 126/78   Pulse 60   Temp 98.3 F (36.8 C) (Temporal)   Ht 6' 1 (1.854 m)   Wt 214 lb (97.1 kg)   SpO2 96%   BMI 28.23 kg/m  BP Readings from Last 3 Encounters:  05/31/24 126/78  05/14/24 132/79  05/07/24 (!) 145/75   Wt Readings from Last 3 Encounters:  05/31/24 214 lb (97.1 kg)  04/26/24 221 lb (100.2 kg)  04/05/24 229 lb 4 oz (104 kg)      Physical Exam Vitals reviewed.  Constitutional:      Appearance: Normal appearance.  HENT:     Head: Normocephalic and atraumatic.  Cardiovascular:     Rate and Rhythm: Normal rate and regular rhythm.  Pulmonary:     Effort: Pulmonary effort is normal.     Breath sounds: Normal breath sounds.  Musculoskeletal:     Cervical back: Neck supple.   Skin:    General: Skin is warm and dry.  Neurological:     Mental Status: He is alert and oriented to person, place, and time.  Psychiatric:        Mood and Affect: Mood normal.        Behavior: Behavior normal.        Thought Content: Thought content normal.        Judgment: Judgment normal.      No results found for any visits on 05/31/24.    The 10-year ASCVD risk score (Arnett DK, et al., 2019) is: 31.7%    Assessment & Plan:   Problem List Items Addressed This Visit       Respiratory   Chronic obstructive pulmonary disease (HCC)   Relevant Orders   AMB referral to pulmonary rehabilitation     Endocrine   Type 2 diabetes mellitus with hyperlipidemia (HCC) - Primary   Relevant Medications   icosapent  Ethyl (VASCEPA ) 1 g capsule   Other Relevant Orders   Lipid panel   CBC   Comprehensive metabolic panel with GFR   Hemoglobin A1c   Other Visit Diagnoses       High triglycerides       Relevant Medications   icosapent  Ethyl (VASCEPA ) 1 g capsule   Other Relevant Orders   Lipid panel   CBC  Comprehensive metabolic panel with GFR   Hemoglobin A1c      Assessment and Plan Assessment & Plan Type 2 diabetes mellitus with hyperlipidemia and hypertriglyceridemia Uncontrolled diabetes with A1c of 11.1. Hyperlipidemia with LDL of 27. Hypertriglyceridemia previously >800, Ozempic  paused to lower risk of potential pancreatitis until triglycerides lowered. Lifestyle changes have improved blood sugar control. Dexcom used for monitoring. Vascepa  initiated for triglycerides. - Recheck lipid panel for triglycerides. Patient is fasting today. - Consider restarting Ozempic  if triglycerides lowered. - Continue Farxiga  10 mg daily, Lantus  10 units daily, Metformin  500 mg BID. - Continue Vascepa  2 grams BID. - Continue atorvastatin  10 mg daily. - Follow up in 6 weeks.  COPD - Refer back to pulmonary rehabilitation to help patient build endurance.    Return in about 6  weeks (around 07/12/2024) for F/U with Lauraine.    Lauraine FORBES Pereyra, NP  "

## 2024-06-03 ENCOUNTER — Encounter: Payer: Self-pay | Admitting: Nurse Practitioner

## 2024-06-03 ENCOUNTER — Ambulatory Visit: Attending: Surgery | Admitting: Surgery

## 2024-06-03 ENCOUNTER — Ambulatory Visit: Admitting: Podiatry

## 2024-06-03 ENCOUNTER — Telehealth (HOSPITAL_COMMUNITY): Payer: Self-pay

## 2024-06-03 NOTE — Progress Notes (Deleted)
 "                                   Vascular and Vein Specialist of [facility]  Patient name: Luis [name] MRN: [id number] DOB: [birth year] Sex: male   REQUESTING PROVIDER:    Dr. Gershon   REASON FOR CONSULT:    ***  HISTORY OF PRESENT ILLNESS:   Luis [name] is a 62 y.o. male, who is ***  PAST MEDICAL HISTORY    Past Medical History:  Diagnosis Date   Acute respiratory failure with hypoxia (HCC) 12/23/2022   Alcohol withdrawal syndrome, with delirium (HCC) 12/27/2022   COPD (chronic obstructive pulmonary disease) (HCC)    Diabetes mellitus without complication (HCC)    Diverticulitis    DVT (deep venous thrombosis) (HCC)    x3   Encephalopathy acute 12/28/2022   ETOH abuse    H/O colectomy    Hyperlipidemia    Hypertension    Sepsis associated hypotension (HCC) 08/03/2019   Smoker      FAMILY HISTORY   Family History  Problem Relation Age of Onset   COPD Mother    Prostate cancer Father    Colon cancer Neg Hx    Rectal cancer Neg Hx    Stomach cancer Neg Hx    Esophageal cancer Neg Hx     SOCIAL HISTORY:   Social History   Socioeconomic History   Marital status: Single    Spouse name: Not on file   Number of children: 1   Years of education: Not on file   Highest education level: 12th grade  Occupational History   Occupation: Pt states not able to work  Tobacco Use   Smoking status: Every Day    Current packs/day: 0.75    Average packs/day: 0.8 packs/day for 49.0 years (36.7 ttl pk-yrs)    Types: Cigarettes    Start date: 70   Smokeless tobacco: Current   Tobacco comments:    Less than .5pk daily 10/03/23  Vaping Use   Vaping status: Never Used  Substance and Sexual Activity   Alcohol use: Yes    Alcohol/week: 4.0 standard drinks of alcohol    Types: 4 Shots of liquor per week    Comment: vodka   Drug use: Yes    Types: Marijuana    Comment: pt states he smokes hemp   Sexual activity: Not Currently  Other Topics Concern   Not  on file  Social History Narrative   Watches TV, has a son who travels a lot, stated if he needed anything his son would help out   Social Drivers of Health   Tobacco Use: High Risk (04/23/2024)   Patient History    Smoking Tobacco Use: Every Day    Smokeless Tobacco Use: Current    Passive Exposure: Not on file  Financial Resource Strain: Not on file  Food Insecurity: No Food Insecurity (12/21/2023)   Epic    Worried About Programme Researcher, Broadcasting/film/video in the Last Year: Never true    Ran Out of Food in the Last Year: Never true  Transportation Needs: No Transportation Needs (12/21/2023)   Epic    Lack of Transportation (Medical): No    Lack of Transportation (Non-Medical): No  Recent Concern: Transportation Needs - Unmet Transportation Needs (10/12/2023)   PRAPARE - Administrator, Civil Service (Medical): Yes    Lack of Transportation (Non-Medical): Yes  Physical Activity: Not on file  Stress: Stress Concern Present (04/18/2023)   [employer] of Occupational Health - Occupational Stress Questionnaire    Feeling of Stress : To some extent  Social Connections: Not on file  Intimate Partner Violence: Patient Declined (12/21/2023)   Epic    Fear of Current or Ex-Partner: Patient declined    Emotionally Abused: Patient declined    Physically Abused: Patient declined    Sexually Abused: Patient declined  Depression (PHQ2-9): Medium Risk (10/25/2023)   Depression (PHQ2-9)    PHQ-2 Score: 6  Alcohol Screen: Not on file  Housing: Unknown (12/21/2023)   Epic    Unable to Pay for Housing in the Last Year: No    Number of Times Moved in the Last Year: Not on file    Homeless in the Last Year: No  Utilities: Patient Declined (12/21/2023)   Epic    Threatened with loss of utilities: Patient declined  Health Literacy: Not on file    ALLERGIES:    Allergies[1]  CURRENT MEDICATIONS:    Current Outpatient Medications  Medication Sig Dispense Refill   acetaminophen  (TYLENOL )  325 MG tablet Take 325-500 mg by mouth every 8 (eight) hours as needed for moderate pain (pain score 4-6).     allopurinol  (ZYLOPRIM ) 100 MG tablet Take 1 tablet (100 mg total) by mouth daily. 90 tablet 3   ascorbic acid  (VITAMIN C ) 500 MG tablet Take 1 tablet (500 mg total) by mouth daily. 30 tablet 1   atorvastatin  (LIPITOR) 10 MG tablet Take 1 tablet (10 mg total) by mouth daily. 90 tablet 3   cetirizine  (ZYRTEC ) 5 MG tablet Take 5 mg by mouth daily.     ciclopirox  (PENLAC ) 8 % solution Apply topically at bedtime over nail and surrounding skin. Apply daily over previous coat. After seven (7) days, remove with alcohol and continue cycle. 6.6 mL 2   colchicine  0.6 MG tablet Take 1 tablet (0.6 mg total) by mouth daily. 7 tablet 0   Continuous Glucose Receiver (DEXCOM G7 RECEIVER) DEVI Use as directed. 1 each 0   dapagliflozin  propanediol (FARXIGA ) 10 MG TABS tablet Take 1 tablet (10 mg total) by mouth daily before breakfast. 90 tablet 3   fluticasone  (FLOVENT  HFA) 110 MCG/ACT inhaler Inhale 1 puff into the lungs in the morning and at bedtime. 12 g 2   folic acid  (FOLVITE ) 1 MG tablet Take 1 tablet (1 mg total) by mouth daily. 90 tablet 0   gabapentin  (NEURONTIN ) 300 MG capsule Take 2 capsules (600 mg total) by mouth 3 (three) times daily. 360 capsule 3   guaiFENesin  (MUCINEX ) 600 MG 12 hr tablet Take 1 tablet (600 mg total) by mouth 2 (two) times daily as needed to loosen phlegm or cough. 180 tablet 1   hydrOXYzine  (ATARAX ) 25 MG tablet Take 25 mg by mouth at bedtime as needed.     icosapent  Ethyl (VASCEPA ) 1 g capsule Take 2 capsules (2 g total) by mouth 2 (two) times daily. 120 capsule 3   insulin  glargine (LANTUS  SOLOSTAR) 100 UNIT/ML Solostar Pen Inject 10 Units into the skin daily. 15 mL 1   Insulin  Pen Needle 32G X 4 MM MISC Use once daily to administer insulin  100 each 1   ipratropium-albuterol  (DUONEB) 0.5-2.5 (3) MG/3ML SOLN Take 3 mLs by nebulization every 6 (six) hours as needed (wheezing  / sob).     losartan  (COZAAR ) 25 MG tablet Take 1 tablet (25 mg total) by mouth daily. 90 tablet  3   metFORMIN  (GLUCOPHAGE ) 500 MG tablet Take 1 tablet (500 mg total) by mouth 2 (two) times daily with a meal. 180 tablet 0   metoprolol  succinate (TOPROL -XL) 25 MG 24 hr tablet Take 1 tablet (25 mg total) by mouth daily. 90 tablet 3   Semaglutide ,0.25 or 0.5MG /DOS, (OZEMPIC , 0.25 OR 0.5 MG/DOSE,) 2 MG/3ML SOPN Inject 0.25 mg into the skin once a week. (Patient not taking: Reported on 05/31/2024) 3 mL 1   Tiotropium Bromide -Olodaterol 2.5-2.5 MCG/ACT AERS Inhale 2 puffs into the lungs daily. 4 g 3   torsemide  40 MG TABS Take 40 mg by mouth daily.     triamcinolone  cream (KENALOG ) 0.5 % Apply topically to legs twice daily for up to 2 weeks as needed for eczema 30 g 1   Zoster Vaccine Adjuvanted (SHINGRIX ) injection Inject into the muscle. 1 each 1   No current facility-administered medications for this visit.    REVIEW OF SYSTEMS:   [X]  denotes positive finding, [ ]  denotes negative finding Cardiac  Comments:  Chest pain or chest pressure: ***   Shortness of breath upon exertion:    Short of breath when lying flat:    Irregular heart rhythm:        Vascular    Pain in calf, thigh, or hip brought on by ambulation:    Pain in feet at night that wakes you up from your sleep:     Blood clot in your veins:    Leg swelling:         Pulmonary    Oxygen at home:    Productive cough:     Wheezing:         Neurologic    Sudden weakness in arms or legs:     Sudden numbness in arms or legs:     Sudden onset of difficulty speaking or slurred speech:    Temporary loss of vision in one eye:     Problems with dizziness:         Gastrointestinal    Blood in stool:      Vomited blood:         Genitourinary    Burning when urinating:     Blood in urine:        Psychiatric    Major depression:         Hematologic    Bleeding problems:    Problems with blood clotting too easily:         Skin    Rashes or ulcers:        Constitutional    Fever or chills:     PHYSICAL EXAM:   There were no vitals filed for this visit.  GENERAL: The patient is a well-nourished male, in no acute distress. The vital signs are documented above. CARDIAC: There is a regular rate and rhythm.  VASCULAR: *** PULMONARY: Nonlabored respirations ABDOMEN: Soft and non-tender with normal pitched bowel sounds.  MUSCULOSKELETAL: There are no major deformities or cyanosis. NEUROLOGIC: No focal weakness or paresthesias are detected. SKIN: There are no ulcers or rashes noted. PSYCHIATRIC: The patient has a normal affect.  STUDIES:   I have reviewed the following: . ABI/TBIToday's ABIToday's TBIPrevious ABIPrevious TBI  +-------+-----------+-----------+------------+------------+  Right 0.96       0.68                                 +-------+-----------+-----------+------------+------------+  Left  0.67       0.50                                 +-------+-----------+-----------+------------+------------+  Right toe pressure: 85 Left toe pressure: 62 Waveforms are multiphasic on the right and biphasic on the left  ASSESSMENT and PLAN   ***   Malvina New, IV, MD, FACS [facility] Special[facility] [phone] Pager [phone]     [1]  Allergies Allergen Reactions   Codeine Hives   Lisinopril Other (See Comments)    Other reaction(s): renal effects   "

## 2024-06-03 NOTE — Telephone Encounter (Signed)
 This encounter was created in error - please disregard.

## 2024-06-03 NOTE — Telephone Encounter (Signed)
 Attempted to call patient to confirm interest in returning to pulmonary rehab (has attended previously, discharged in July 2025). No answer, left message. Sent MyChart message.  Referral does not have MD signature yet.

## 2024-06-04 ENCOUNTER — Other Ambulatory Visit: Payer: Self-pay | Admitting: Internal Medicine

## 2024-06-04 ENCOUNTER — Other Ambulatory Visit: Payer: Self-pay | Admitting: Nurse Practitioner

## 2024-06-04 ENCOUNTER — Other Ambulatory Visit: Payer: Self-pay

## 2024-06-04 ENCOUNTER — Observation Stay (HOSPITAL_COMMUNITY)
Admission: EM | Admit: 2024-06-04 | Discharge: 2024-06-06 | Disposition: A | Discharge status: Home / Self Care | Attending: Emergency Medicine | Admitting: Emergency Medicine

## 2024-06-04 ENCOUNTER — Encounter (HOSPITAL_COMMUNITY): Payer: Self-pay

## 2024-06-04 ENCOUNTER — Telehealth: Payer: Self-pay | Admitting: Nurse Practitioner

## 2024-06-04 DIAGNOSIS — J449 Chronic obstructive pulmonary disease, unspecified: Secondary | ICD-10-CM | POA: Diagnosis not present

## 2024-06-04 DIAGNOSIS — Z932 Ileostomy status: Secondary | ICD-10-CM | POA: Insufficient documentation

## 2024-06-04 DIAGNOSIS — E785 Hyperlipidemia, unspecified: Secondary | ICD-10-CM | POA: Diagnosis not present

## 2024-06-04 DIAGNOSIS — I5042 Chronic combined systolic (congestive) and diastolic (congestive) heart failure: Secondary | ICD-10-CM

## 2024-06-04 DIAGNOSIS — E1169 Type 2 diabetes mellitus with other specified complication: Secondary | ICD-10-CM

## 2024-06-04 DIAGNOSIS — I4892 Unspecified atrial flutter: Secondary | ICD-10-CM | POA: Insufficient documentation

## 2024-06-04 DIAGNOSIS — I5032 Chronic diastolic (congestive) heart failure: Secondary | ICD-10-CM | POA: Insufficient documentation

## 2024-06-04 DIAGNOSIS — Z79899 Other long term (current) drug therapy: Secondary | ICD-10-CM | POA: Insufficient documentation

## 2024-06-04 DIAGNOSIS — G6289 Other specified polyneuropathies: Secondary | ICD-10-CM | POA: Diagnosis not present

## 2024-06-04 DIAGNOSIS — I10 Essential (primary) hypertension: Secondary | ICD-10-CM | POA: Diagnosis present

## 2024-06-04 DIAGNOSIS — M109 Gout, unspecified: Secondary | ICD-10-CM | POA: Diagnosis not present

## 2024-06-04 DIAGNOSIS — E119 Type 2 diabetes mellitus without complications: Secondary | ICD-10-CM | POA: Insufficient documentation

## 2024-06-04 DIAGNOSIS — I110 Hypertensive heart disease with heart failure: Secondary | ICD-10-CM | POA: Insufficient documentation

## 2024-06-04 DIAGNOSIS — K651 Peritoneal abscess: Principal | ICD-10-CM | POA: Insufficient documentation

## 2024-06-04 DIAGNOSIS — T8189XA Other complications of procedures, not elsewhere classified, initial encounter: Secondary | ICD-10-CM | POA: Insufficient documentation

## 2024-06-04 DIAGNOSIS — E781 Pure hyperglyceridemia: Secondary | ICD-10-CM

## 2024-06-04 DIAGNOSIS — F17210 Nicotine dependence, cigarettes, uncomplicated: Secondary | ICD-10-CM | POA: Diagnosis not present

## 2024-06-04 DIAGNOSIS — Z794 Long term (current) use of insulin: Secondary | ICD-10-CM | POA: Insufficient documentation

## 2024-06-04 DIAGNOSIS — Z9889 Other specified postprocedural states: Secondary | ICD-10-CM | POA: Diagnosis not present

## 2024-06-04 DIAGNOSIS — R748 Abnormal levels of other serum enzymes: Secondary | ICD-10-CM

## 2024-06-04 DIAGNOSIS — F1290 Cannabis use, unspecified, uncomplicated: Secondary | ICD-10-CM | POA: Insufficient documentation

## 2024-06-04 DIAGNOSIS — K659 Peritonitis, unspecified: Principal | ICD-10-CM

## 2024-06-04 DIAGNOSIS — L0291 Cutaneous abscess, unspecified: Secondary | ICD-10-CM | POA: Diagnosis present

## 2024-06-04 DIAGNOSIS — E1142 Type 2 diabetes mellitus with diabetic polyneuropathy: Secondary | ICD-10-CM

## 2024-06-04 DIAGNOSIS — K9403 Colostomy malfunction: Secondary | ICD-10-CM | POA: Diagnosis present

## 2024-06-04 DIAGNOSIS — L249 Irritant contact dermatitis, unspecified cause: Secondary | ICD-10-CM

## 2024-06-04 DIAGNOSIS — F17200 Nicotine dependence, unspecified, uncomplicated: Secondary | ICD-10-CM | POA: Diagnosis present

## 2024-06-04 LAB — CBC WITH DIFFERENTIAL/PLATELET
Abs Immature Granulocytes: 0.05 K/uL (ref 0.00–0.07)
Basophils Absolute: 0.1 K/uL (ref 0.0–0.1)
Basophils Relative: 1 %
Eosinophils Absolute: 0.2 K/uL (ref 0.0–0.5)
Eosinophils Relative: 2 %
HCT: 45.8 % (ref 39.0–52.0)
Hemoglobin: 14.6 g/dL (ref 13.0–17.0)
Immature Granulocytes: 0 %
Lymphocytes Relative: 14 %
Lymphs Abs: 1.9 K/uL (ref 0.7–4.0)
MCH: 33 pg (ref 26.0–34.0)
MCHC: 31.9 g/dL (ref 30.0–36.0)
MCV: 103.4 fL — ABNORMAL HIGH (ref 80.0–100.0)
Monocytes Absolute: 1 K/uL (ref 0.1–1.0)
Monocytes Relative: 7 %
Neutro Abs: 10.9 K/uL — ABNORMAL HIGH (ref 1.7–7.7)
Neutrophils Relative %: 76 %
Platelets: 243 K/uL (ref 150–400)
RBC: 4.43 MIL/uL (ref 4.22–5.81)
RDW: 13.7 % (ref 11.5–15.5)
WBC: 14.2 K/uL — ABNORMAL HIGH (ref 4.0–10.5)
nRBC: 0 % (ref 0.0–0.2)

## 2024-06-04 LAB — BASIC METABOLIC PANEL WITH GFR
Anion gap: 13 (ref 5–15)
BUN: 40 mg/dL — ABNORMAL HIGH (ref 8–23)
CO2: 24 mmol/L (ref 22–32)
Calcium: 10.3 mg/dL (ref 8.9–10.3)
Chloride: 102 mmol/L (ref 98–111)
Creatinine, Ser: 1.41 mg/dL — ABNORMAL HIGH (ref 0.61–1.24)
GFR, Estimated: 56 mL/min — ABNORMAL LOW
Glucose, Bld: 148 mg/dL — ABNORMAL HIGH (ref 70–99)
Potassium: 4.3 mmol/L (ref 3.5–5.1)
Sodium: 139 mmol/L (ref 135–145)

## 2024-06-04 MED ORDER — MORPHINE SULFATE (PF) 4 MG/ML IV SOLN
4.0000 mg | Freq: Once | INTRAVENOUS | Status: AC
  Administered 2024-06-05: 4 mg via INTRAVENOUS
  Filled 2024-06-04: qty 1

## 2024-06-04 NOTE — Telephone Encounter (Signed)
 Please call patient and let him know that overall his labs have improved! His triglycerides are better and his blood sugars have improved as well. He should restart the ozempic  at 0.25mg /week and continue all of his other medications as prescribed. One of his enzymes is starting to trend upward and needs to be further investigated to determine if it is related to his liver disease, diabetes, or bone disease. Please have him come back to lab for further labs so we can determine the source of the elevation.

## 2024-06-04 NOTE — ED Provider Notes (Signed)
 " [place] EMERGENCY DEPARTMENT AT [facility] Provider Note   CSN: [id number] Arrival date & time: 06/04/24  8175     Patient presents with: Colostomy bag problem   Luis [name] is a 63 y.o. male with past medical history significant for tobacco use, alcohol use, diabetes, chronic abdominal wound infection, obesity, heart failure who presents concern for issue with his colostomy bag.  Patient reports that he has been unable to reattach the bag.  He is having increased skin breakdown, irritation.  Reports significant pain at the skin site.   HPI     Prior to Admission medications  Medication Sig Start Date End Date Taking? Authorizing Provider  acetaminophen  (TYLENOL ) 325 MG tablet Take 325-500 mg by mouth every 8 (eight) hours as needed for moderate pain (pain score 4-6).    [provider]  allopurinol  (ZYLOPRIM ) 100 MG tablet Take 1 tablet (100 mg total) by mouth daily. 02/23/24   Elnor [name], NP  ascorbic acid  (VITAMIN C ) 500 MG tablet Take 1 tablet (500 mg total) by mouth daily. 01/01/23     atorvastatin  (LIPITOR) 10 MG tablet Take 1 tablet (10 mg total) by mouth daily. 02/23/24   Elnor [name], NP  cetirizine  (ZYRTEC ) 5 MG tablet Take 5 mg by mouth daily.    [provider]  ciclopirox  (PENLAC ) 8 % solution Apply topically at bedtime over nail and surrounding skin. Apply daily over previous coat. After seven (7) days, remove with alcohol and continue cycle. 04/23/24   Gershon Donnice SAUNDERS, DPM  colchicine  0.6 MG tablet Take 1 tablet (0.6 mg total) by mouth daily. 04/15/24   Gershon Donnice SAUNDERS, DPM  Continuous Glucose Receiver (DEXCOM G7 RECEIVER) DEVI Use as directed. 03/18/24   Alvia Corean CROME, FNP  dapagliflozin  propanediol (FARXIGA ) 10 MG TABS tablet Take 1 tablet (10 mg total) by mouth daily before breakfast. 02/23/24   Elnor [name], NP  fluticasone  (FLOVENT  HFA) 110 MCG/ACT inhaler Inhale 1 puff into the lungs in the morning and at bedtime.  12/16/23   Danton Reyes DASEN, MD  folic acid  (FOLVITE ) 1 MG tablet Take 1 tablet (1 mg total) by mouth daily. 05/16/24   Elnor [name], NP  gabapentin  (NEURONTIN ) 300 MG capsule Take 2 capsules (600 mg total) by mouth 3 (three) times daily. 04/11/24 12/07/24  Sikora, Rebecca, DPM  guaiFENesin  (MUCINEX ) 600 MG 12 hr tablet Take 1 tablet (600 mg total) by mouth 2 (two) times daily as needed to loosen phlegm or cough. 10/11/23   Elnor [name], NP  hydrOXYzine  (ATARAX ) 25 MG tablet Take 25 mg by mouth at bedtime as needed.    [provider]  icosapent  Ethyl (VASCEPA ) 1 g capsule Take 2 capsules (2 g total) by mouth 2 (two) times daily. 05/31/24   Elnor [name], NP  insulin  glargine (LANTUS  SOLOSTAR) 100 UNIT/ML Solostar Pen Inject 10 Units into the skin daily. 04/11/24   Elnor [name], NP  Insulin  Pen Needle 32G X 4 MM MISC Use once daily to administer insulin  04/11/24   Elnor [name], NP  ipratropium-albuterol  (DUONEB) 0.5-2.5 (3) MG/3ML SOLN Take 3 mLs by nebulization every 6 (six) hours as needed (wheezing / sob).    [provider]  losartan  (COZAAR ) 25 MG tablet Take 1 tablet (25 mg total) by mouth daily. 02/23/24   Elnor [name], NP  metFORMIN  (GLUCOPHAGE ) 500 MG tablet Take 1 tablet (500 mg total) by mouth 2 (two) times daily with a  meal. 04/11/24   Elnor [name], NP  metoprolol  succinate (TOPROL -XL) 25 MG 24 hr tablet Take 1 tablet (25 mg total) by mouth daily. 05/23/24   Elnor [name], NP  Semaglutide ,0.25 or 0.5MG /DOS, (OZEMPIC , 0.25 OR 0.5 MG/DOSE,) 2 MG/3ML SOPN Inject 0.25 mg into the skin once a week. Patient not taking: Reported on 05/31/2024 04/05/24   Elnor [name], NP  Tiotropium Bromide -Olodaterol 2.5-2.5 MCG/ACT AERS Inhale 2 puffs into the lungs daily. 02/23/24   Elnor [name], NP  torsemide  40 MG TABS Take 40 mg by mouth daily. 04/26/24   Gray, Sarah E, NP  triamcinolone  cream (KENALOG ) 0.5 % Apply topically to legs twice daily for up to 2 weeks as needed for eczema  08/23/23   Elnor [name], NP    Allergies: Codeine and Lisinopril    Review of Systems  All other systems reviewed and are negative.   Updated Vital Signs BP 127/81 (BP Location: Left Arm)   Pulse 67   Temp (!) 97.1 F (36.2 C) (Oral)   Resp 18   Ht 6' 1 (1.854 m)   Wt 95.3 kg   SpO2 97%   BMI 27.71 kg/m   Physical Exam Vitals and nursing note reviewed.  Constitutional:      General: He is not in acute distress.    Appearance: Normal appearance.  HENT:     Head: Normocephalic and atraumatic.  Eyes:     General:        Right eye: No discharge.        Left eye: No discharge.  Cardiovascular:     Rate and Rhythm: Normal rate and regular rhythm.     Heart sounds: No murmur heard.    No friction rub. No gallop.  Pulmonary:     Effort: Pulmonary effort is normal.     Breath sounds: Normal breath sounds.  Abdominal:     General: Bowel sounds are normal.     Palpations: Abdomen is soft.     Comments: Significant skin redness, irritation, scabbing, with some warmth, but no obvious drainage and a large area over the epigastric region, patient also with significant skin breakdown, redness, irritation surrounding the ostomy site and right lower abdomen.  Large distended, protuberant abdomen consistent with known history of ascites.  Skin:    General: Skin is warm and dry.     Capillary Refill: Capillary refill takes less than 2 seconds.  Neurological:     Mental Status: He is alert and oriented to person, place, and time.  Psychiatric:        Mood and Affect: Mood normal.        Behavior: Behavior normal.     (all labs ordered are listed, but only abnormal results are displayed) Labs Reviewed  CBC WITH DIFFERENTIAL/PLATELET - Abnormal; Notable for the following components:      Result Value   WBC 14.2 (*)    MCV 103.4 (*)    Neutro Abs 10.9 (*)    All other components within normal limits  BASIC METABOLIC PANEL WITH GFR - Abnormal; Notable for the following  components:   Glucose, Bld 148 (*)    BUN 40 (*)    Creatinine, Ser 1.41 (*)    GFR, Estimated 56 (*)    All other components within normal limits  ETHANOL  I-STAT CG4 LACTIC ACID, ED    EKG: None  Radiology: CT ABDOMEN PELVIS W CONTRAST Result Date: 06/05/2024 EXAM: CT ABDOMEN AND PELVIS WITH  CONTRAST 06/05/2024 02:30:00 AM TECHNIQUE: CT of the abdomen and pelvis was performed with the administration of 75 mL of iohexol  (OMNIPAQUE ) 350 MG/ML injection. Multiplanar reformatted images are provided for review. Automated exposure control, iterative reconstruction, and/or weight-based adjustment of the mA/kV was utilized to reduce the radiation dose to as low as reasonably achievable. COMPARISON: CT from 08/06/2023. CLINICAL HISTORY: Abdominal pain, acute, nonlocalized; problem with ostomy. FINDINGS: LOWER CHEST: No acute abnormality. LIVER: Nodular hepatic contour compatible with cirrhosis. GALLBLADDER AND BILE DUCTS: Gallbladder is unremarkable. No biliary ductal dilatation. SPLEEN: No acute abnormality. PANCREAS: No acute abnormality. ADRENAL GLANDS: No acute abnormality. KIDNEYS, URETERS AND BLADDER: No stones in the kidneys or ureters. No hydronephrosis. No perinephric or periureteral stranding. Urinary bladder is unremarkable. 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 Stomach demonstrates no acute abnormality. There is no bowel obstruction. PERITONEUM AND RETROPERITONEUM: There is an peripheral enhancing fluid collection measuring 12 mm which may communicate with the greater curvature of the gastric body (series 8, image [id number]). Small soft tissue nodule or fluid collection with adjacent inflammatory stranding measuring 1.9 x 1.6 cm (series 2, image 36) inferior to the gallbladder fossa . No free air. VASCULATURE: Aortic atherosclerotic calcification. Aorta is normal in caliber. LYMPH NODES: No lymphadenopathy. REPRODUCTIVE  ORGANS: No acute abnormality. BONES AND SOFT TISSUES: No acute osseous abnormality. IMPRESSION: 1. Peripheral enhancing small fluid collections inferior to the gallbladder fossa and along the greater curvature of the stomach. These may represent small abscesses. Close interval follow up is recommended. Electronically signed by: Norman Gatlin MD 06/05/2024 02:57 AM EST RP Workstation: [id number]     Procedures   Medications Ordered in the ED  metroNIDAZOLE  (FLAGYL ) IVPB 500 mg (500 mg Intravenous New Bag/Given 06/05/24 0441)  morphine  (PF) 4 MG/ML injection 4 mg (4 mg Intravenous Given 06/05/24 0128)  iohexol  (OMNIPAQUE ) 350 MG/ML injection 75 mL (75 mLs Intravenous Contrast Given 06/05/24 0220)  cefTRIAXone  (ROCEPHIN ) 2 g in sodium chloride  0.9 % 100 mL IVPB (0 g Intravenous Stopped 06/05/24 0441)                                    Medical Decision Making Amount and/or Complexity of Data Reviewed Labs: ordered. Radiology: ordered.  Risk Prescription drug management.   This patient is a 63 y.o. male  who presents to the ED for concern of cellulitis, colostomy bag issue.   Differential diagnoses prior to evaluation: The emergent differential diagnosis includes, but is not limited to, intra-abdominal infection, cellulitis, bacteremia, sepsis,. This is not an exhaustive differential.   Past Medical History / Co-morbidities / Social History: tobacco use, alcohol use, diabetes, chronic abdominal wound infection, obesity, heart failure  Physical Exam: Physical exam performed. The pertinent findings include: Significant skin redness, irritation, scabbing, with some warmth, but no obvious drainage and a large area over the epigastric region, patient also with significant skin breakdown, redness, irritation surrounding the ostomy site and right lower abdomen.  Large distended, protuberant abdomen consistent with known history of ascites.   Some hypertension on arrival, BP 142/116, improved  to 136/72 on recheck. He did have one documented oxygen of 85% but feel that this was likely spurious as it was not found at any other time, and patient without any respiratory symptoms  Lab Tests/Imaging studies: I personally interpreted labs/imaging and the pertinent results include: Worsening leukocytosis, blood cells 14.2  suggestive of some cellulitis.  BMP overall unremarkable, other than some elevated BUN, creatinine although fairly stable compared to his baseline.  Normal lactic acid, negative ethanol.  CT abdomen pelvis shows  1. Peripheral enhancing small fluid collections inferior to the gallbladder  fossa and along the greater curvature of the stomach. These may represent small  abscesses. Close interval follow up is recommended.  . I agree with the radiologist interpretation.   Medications: I ordered medication including morphine  for pain.  I have reviewed the patients home medicines and have made adjustments as needed.  Consults: Spoke with the hospitalist, Dr. Alfornia who agrees to admission for intra-abdominal infection with need for IV antibiotics. Does request surgery consult this evening.  Plan for surgery and GI consult in the morning.  Patient will need ostomy care during his hospital admission.   Disposition: After consideration of the diagnostic results and the patients response to treatment, I feel that patient would benefit from hospital admission at this time.    Final diagnoses:  Abdominal infection (HCC)  Irritant contact dermatitis, unspecified trigger    ED Discharge Orders     None          Luis [name] 06/05/24 0525    Raford Lenis, MD 06/05/24 [phone]  "

## 2024-06-04 NOTE — ED Provider Triage Note (Signed)
 Emergency Medicine Provider Triage Evaluation Note  Sante Biedermann , a 63 y.o. male  was evaluated in triage.  Pt complains of a rash around his ostomy site.  He states it is making it difficult for the colostomy bag to stick to..  Review of Systems  Positive: As above Negative: As above  Physical Exam  BP (!) 142/116 (BP Location: Right Arm)   Pulse 71   Temp 98.1 F (36.7 C)   Resp 18   Ht 6' 1 (1.854 m)   Wt 95.3 kg   SpO2 99%   BMI 27.71 kg/m  Gen:   Awake, no distress   Resp:  Normal effort  MSK:   Moves extremities without difficulty  Other:    Medical Decision Making  Medically screening exam initiated at 6:59 PM.  Appropriate orders placed.  Gorden Stthomas was informed that the remainder of the evaluation will be completed by another provider, this initial triage assessment does not replace that evaluation, and the importance of remaining in the ED until their evaluation is complete.    Hildegard Loge, PA-C 06/04/24 1900

## 2024-06-04 NOTE — ED Triage Notes (Signed)
 Pt states that his colostomy bag has come off and he is unable to reattach it.

## 2024-06-05 ENCOUNTER — Other Ambulatory Visit (HOSPITAL_COMMUNITY): Payer: Self-pay

## 2024-06-05 ENCOUNTER — Emergency Department (HOSPITAL_COMMUNITY)

## 2024-06-05 DIAGNOSIS — K7689 Other specified diseases of liver: Secondary | ICD-10-CM | POA: Diagnosis not present

## 2024-06-05 DIAGNOSIS — L0291 Cutaneous abscess, unspecified: Secondary | ICD-10-CM | POA: Diagnosis not present

## 2024-06-05 DIAGNOSIS — K828 Other specified diseases of gallbladder: Secondary | ICD-10-CM | POA: Diagnosis not present

## 2024-06-05 DIAGNOSIS — R109 Unspecified abdominal pain: Secondary | ICD-10-CM | POA: Diagnosis not present

## 2024-06-05 LAB — CBC
HCT: 41 % (ref 39.0–52.0)
Hemoglobin: 13.1 g/dL (ref 13.0–17.0)
MCH: 32.4 pg (ref 26.0–34.0)
MCHC: 32 g/dL (ref 30.0–36.0)
MCV: 101.5 fL — ABNORMAL HIGH (ref 80.0–100.0)
Platelets: 235 K/uL (ref 150–400)
RBC: 4.04 MIL/uL — ABNORMAL LOW (ref 4.22–5.81)
RDW: 14 % (ref 11.5–15.5)
WBC: 10.6 K/uL — ABNORMAL HIGH (ref 4.0–10.5)
nRBC: 0 % (ref 0.0–0.2)

## 2024-06-05 LAB — GLUCOSE, CAPILLARY
Glucose-Capillary: 94 mg/dL (ref 70–99)
Glucose-Capillary: 132 mg/dL — ABNORMAL HIGH (ref 70–99)

## 2024-06-05 LAB — CBG MONITORING, ED
Glucose-Capillary: 110 mg/dL — ABNORMAL HIGH (ref 70–99)
Glucose-Capillary: 115 mg/dL — ABNORMAL HIGH (ref 70–99)

## 2024-06-05 LAB — ETHANOL: Alcohol, Ethyl (B): <15 mg/dL

## 2024-06-05 LAB — HIV ANTIBODY (ROUTINE TESTING W REFLEX): HIV Screen 4th Generation wRfx: NONREACTIVE

## 2024-06-05 LAB — I-STAT CG4 LACTIC ACID, ED: Lactic Acid, Venous: 1.3 mmol/L (ref 0.5–1.9)

## 2024-06-05 MED ORDER — GABAPENTIN 300 MG PO CAPS
600.0000 mg | ORAL_CAPSULE | Freq: Three times a day (TID) | ORAL | Status: DC
  Administered 2024-06-05 – 2024-06-06 (×4): 600 mg via ORAL
  Filled 2024-06-05 (×4): qty 2

## 2024-06-05 MED ORDER — METRONIDAZOLE 500 MG/100ML IV SOLN
500.0000 mg | Freq: Once | INTRAVENOUS | Status: AC
  Administered 2024-06-05: 500 mg via INTRAVENOUS
  Filled 2024-06-05: qty 100

## 2024-06-05 MED ORDER — UMECLIDINIUM BROMIDE 62.5 MCG/ACT IN AEPB
1.0000 | INHALATION_SPRAY | Freq: Every day | RESPIRATORY_TRACT | Status: DC
  Filled 2024-06-05: qty 7

## 2024-06-05 MED ORDER — IPRATROPIUM-ALBUTEROL 0.5-2.5 (3) MG/3ML IN SOLN: 3.0000 mL | Freq: Four times a day (QID) | RESPIRATORY_TRACT | Status: DC | PRN

## 2024-06-05 MED ORDER — ACETAMINOPHEN 650 MG RE SUPP: 650.0000 mg | Freq: Four times a day (QID) | RECTAL | Status: DC | PRN

## 2024-06-05 MED ORDER — ACETAMINOPHEN 325 MG PO TABS
650.0000 mg | ORAL_TABLET | Freq: Four times a day (QID) | ORAL | Status: DC | PRN
  Administered 2024-06-05: 650 mg via ORAL
  Filled 2024-06-05: qty 2

## 2024-06-05 MED ORDER — TORSEMIDE 20 MG PO TABS
40.0000 mg | ORAL_TABLET | Freq: Every day | ORAL | Status: DC
  Administered 2024-06-05 – 2024-06-06 (×2): 40 mg via ORAL
  Filled 2024-06-05 (×2): qty 2

## 2024-06-05 MED ORDER — ALLOPURINOL 100 MG PO TABS
100.0000 mg | ORAL_TABLET | Freq: Every day | ORAL | Status: DC
  Administered 2024-06-05 – 2024-06-06 (×2): 100 mg via ORAL
  Filled 2024-06-05 (×2): qty 1

## 2024-06-05 MED ORDER — SODIUM CHLORIDE 0.9 % IV SOLN
2.0000 g | Freq: Once | INTRAVENOUS | Status: AC
  Administered 2024-06-05: 2 g via INTRAVENOUS
  Filled 2024-06-05: qty 20

## 2024-06-05 MED ORDER — INSULIN GLARGINE 100 UNIT/ML ~~LOC~~ SOLN
5.0000 [IU] | Freq: Every day | SUBCUTANEOUS | Status: DC
  Administered 2024-06-06: 5 [IU] via SUBCUTANEOUS
  Filled 2024-06-05 (×2): qty 0.05

## 2024-06-05 MED ORDER — METOPROLOL SUCCINATE ER 25 MG PO TB24
25.0000 mg | ORAL_TABLET | Freq: Every day | ORAL | Status: DC
  Administered 2024-06-05 – 2024-06-06 (×2): 25 mg via ORAL
  Filled 2024-06-05 (×2): qty 1

## 2024-06-05 MED ORDER — ATORVASTATIN CALCIUM 10 MG PO TABS
10.0000 mg | ORAL_TABLET | Freq: Every day | ORAL | Status: DC
  Administered 2024-06-05 – 2024-06-06 (×2): 10 mg via ORAL
  Filled 2024-06-05 (×2): qty 1

## 2024-06-05 MED ORDER — ICOSAPENT ETHYL 1 G PO CAPS
2.0000 g | ORAL_CAPSULE | Freq: Two times a day (BID) | ORAL | Status: DC
  Administered 2024-06-05 – 2024-06-06 (×3): 2 g via ORAL
  Filled 2024-06-05 (×4): qty 2

## 2024-06-05 MED ORDER — LOSARTAN POTASSIUM 50 MG PO TABS
25.0000 mg | ORAL_TABLET | Freq: Every day | ORAL | Status: DC
  Administered 2024-06-05 – 2024-06-06 (×2): 25 mg via ORAL
  Filled 2024-06-05 (×2): qty 1

## 2024-06-05 MED ORDER — ARFORMOTEROL TARTRATE 15 MCG/2ML IN NEBU
15.0000 ug | INHALATION_SOLUTION | Freq: Two times a day (BID) | RESPIRATORY_TRACT | Status: DC
  Filled 2024-06-05: qty 2

## 2024-06-05 MED ORDER — IOHEXOL 350 MG/ML SOLN
75.0000 mL | Freq: Once | INTRAVENOUS | Status: AC | PRN
  Administered 2024-06-05: 75 mL via INTRAVENOUS

## 2024-06-05 MED ORDER — INSULIN ASPART 100 UNIT/ML IJ SOLN
0.0000 [IU] | INTRAMUSCULAR | Status: DC
  Administered 2024-06-05 – 2024-06-06 (×3): 1 [IU] via SUBCUTANEOUS
  Filled 2024-06-05 (×2): qty 1

## 2024-06-05 MED ORDER — SODIUM CHLORIDE 0.9 % IV SOLN
2.0000 g | INTRAVENOUS | Status: DC
  Administered 2024-06-06: 2 g via INTRAVENOUS
  Filled 2024-06-05: qty 20

## 2024-06-05 MED ORDER — METRONIDAZOLE 500 MG/100ML IV SOLN
500.0000 mg | Freq: Two times a day (BID) | INTRAVENOUS | Status: DC
  Administered 2024-06-05 – 2024-06-06 (×3): 500 mg via INTRAVENOUS
  Filled 2024-06-05 (×3): qty 100

## 2024-06-05 NOTE — Consult Note (Signed)
 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  Patient having difficulty maintaining seal on pouch.  Stomal assessment/size: 1 flush  Peristomal assessment:  Skin is red, denuded and weeping.  WIll implement orders to promote healing.  Treatment options for stomal/peristomal skin:  To skin irritation, needs stoma powder and skin prep x 2 layers.  Light dusting of powder, spread out and seal with no sting skin prep.  NExt, barrier ring around stoma and apply EAKIN pouch (LAWSON # [phone]) Output liquid yellow stool  Ostomy pouching: 1pc. EAKIN pouch with barrier ring  powder and skin prep  Education provided:  bedside nurse to assist patient with EAKIN application.  THis pouch will hold up better to moist skin.  Cut hole in center to 1 1/4 .  Apply warm pack to pouch after applying to promote seal.  Enrolled patient in [employer] DC program: NA Will not follow at this time.  Please re-consult if needed.  Darice Cooley MSN, RN, FNP-BC CWON Wound, Ostomy, Continence Nurse [facility] [phone] Work cell phone:  [phone]

## 2024-06-05 NOTE — Progress Notes (Signed)
 Pt is refusing to wear tele. MD made aware.

## 2024-06-05 NOTE — TOC CM/SW Note (Signed)
 Transition of Care [facility]) - Inpatient Brief Assessment   Patient Details  Name: Luis [name] MRN: [id number] Date of Birth: 06-26-1960  Transition of Care [facility]) CM/SW Contact:    Lauraine FORBES Saa, LCSWA Phone Number: 06/05/2024, 2:57 PM   Clinical Narrative:  2:58 PM Per chart review, patient resides at home with child(ren). Patient has a PCP and insurance. Patient does not have SNF history. Patient has HH history with [facility] from 2021 and has [facility] aide form completed by PCP in November of 2025. Patient has DME (RW, nebulizer) history but also received DME prescriptions for shower chair, straight cane, and shower railings from PCP January of 2025. Patient's preferred pharmacy's are [name], [facility], and [facility]) [phone] [place]. CSW provided SDOH (food, utilities) resources. No other TOC needs identified at this time. TOC will continue to follow.  Transition of Care Asessment: Insurance and Status: Insurance coverage has been reviewed Patient has primary care physician: Yes Home environment has been reviewed: Private Residence Prior level of function:: N/A Prior/Current Home Services: No current home services Social Drivers of Health Review: SDOH reviewed interventions complete Readmission risk has been reviewed: Yes (Currently Observation Status) Transition of care needs: no transition of care needs at this time

## 2024-06-05 NOTE — Discharge Instructions (Signed)
 [employer] assistance programs Crisis assistance programs  -[facility]elessness Arts development officer. If you are experiencing homelessness in [place], [place] , your first point of contact should be Pensions consultant. You can reach Coordinated Entry by calling ([phone] ([phone] or by emailing [email or web address] [email or web address].  Community access points: [employer] [phone] [address], [place]) every Tuesday from 9am-10am. [facility] ([address], [place]) every Wednesday from 8am-9am.   - Coordinated Re-entry Jayson Michael: Dial 211 and request. Offers referrals to homeless shelters in the area.    -The [employer] 8594377995) offers several services to local families, as funding allows. The Emergency Assistance Program (EAP), which they administer, provides household goods, free food, clothing, and financial aid to people in need in the [place]  area. The EAP program does have some qualification, and counselors will interview clients for financial assistance by written referral only. Referrals need to be made by the Department of Social Services or by other EAP approved human services agencies or charities in the area.  -Open Door Ministries of [employer], which can be reached at [phone], offers emergency assistance programs for those in need of help, such as food, rent assistance, a soup kitchen, shelter, and clothing. They are based in [facility] [place]  but provide a number of services to those that qualify for assistance.   [place] Department of Social Services may be able to offer temporary financial assistance and cash grants for paying rent and utilities, Help may be provided for local county residents who may be experiencing personal crisis when other resources, including government programs, are not available. Call [phone]  -High [employer] Army is a [employer] agency, The organization can offer emergency assistance for paying rent, Caremark Rx, utilities, food, household products and furniture. They offer extensive emergency and transitional housing for families, children and single women, and also run a Boy's and D[employer] Food. Thrift Shops, Secondary school teacher, and other aid offered too. [address], [place], [place]  [address], ([phone]  -[place] Low Income Energy Assistance Program -- This is offered for [facility] families. The federal government created [employer] Program provides a one-time cash grant payment to help eligible low-income families pay their electric and heating bills. [address] [place], [place]  [address], [phone]  -[place] Emergency Assistance -- A program offers emergency utility and rent funds for greater [employer] area residents. The program can also provide counseling and referrals to charities and government programs. Also provides food and a free meal program that serves lunch Mondays - Saturdays and dinner seven days per week to individuals in the community. [address], [place], [place]  [address], ([phone]  -[name] - Offers affordable apartment and housing communities across      [place] and [place]. The low income and seniors can access public housing, rental assistance to qualified applicants, and apply for the section 8 rent subsidy program. Other programs include Chiropractor and Engineer, maintenance. [address] [place], [place]  [address], dial [phone].  -The [facility] provides transitional housing to veterans and the disabled. Clients will also access other services too, including assistance in applying for Disability, life skills classes, case management, and assistance in finding permanent housing. [address], [place], [place]  [place] [address], call [phone]  -[place] Transitional Housing through [facility] is for people who were just  evicted or that are formerly homeless. The non-profit will also help then gain self-sufficiency, find a home or apartment to live in, and also provides information on rent assistance when needed. Phone [phone]  -The [place] [employer] [facility] helps low income, elderly, or disabled residents in seven counties in the [place] ([place], [place], [place], [place], [place], [place], [place], and [place]) save energy and reduce their utility bills by improving energy efficiency. Phone [phone].  -[employer] is located in the [place] [facility] in the [employer], [address], [place], [place] [address]. Parking is in the rear of the building. Phone: [phone]   General Email: Tonette Franco  GHC provides free housing counseling assistance in locating affordable rental housing or housing with support services for families and individuals in crisis and the chronically homeless. We provide potential resources for other housing needs like utilities. Our trained counselors also work with clients on budgeting and financial literacy in effort to empower them to take control of their financial situations. [employer] collaborates with homeless service providers and other stakeholders as part of the [employer] COC (Continuum of Care). The (COC) is a regional/local planning body that coordinates housing and services funding for homeless families and individuals. The role of GHC in the COC is through housing counseling to work with people we serve on diversion strategies for those that are at imminent risk of becoming homeless. We also work with the Coordinated Assessment/Entry Specialist who attempts to find temporary solutions and/or connects the people  to Housing First, Rapid Re-housing or transitional housing programs. Our Homelessness Prevention Housing Counselors meet with clients on business days (Monday-Fridays, except scheduled holidays) from 8:30 am to 4:30 pm.  Legal assistance for evictions, foreclosure, and more -If you need free legal advice on civil issues, such as foreclosures, evictions, Electronics engineer, government programs, domestic issues and more, Landscape architect of [place]  [facility]) is a Associate Professor firm that provides free legal services and counsel to lower income people, seniors, disabled, and others, The goal is to ensure everyone has access to justice and fair representation. Call them at ([phone].  [facility] for Housing and Community Studies can provide info about obtaining legal assistance with evictions. Phone ([phone].  Data processing manager  The [employer], [employer]. offers job and Dispensing optician. Resources are focused on helping students obtain the skills and experiences that are necessary to compete in today's challenging and tight job market. The non-profit faith-based community action agency offers internship trainings as well as classroom instruction. Classes are tailored to meet the needs of people in the [facility] region. [place], [place] [address], [phone]  Foreclosure prevention/Debt Services Family Services of the [employer] Credit Counseling Service inludes debt and foreclosure prevention programs for local families. This includes money management, financial advice, budget review and development of a written action plan with a Pensions consultant to help solve specific individual financial problems. In addition, housing and mortgage counselors can also provide pre- and post-purchase homeownership counseling, default resolution counseling (to prevent foreclosure) and reverse mortgage counseling. A Debt Management Program allows  people and families with a high level of credit card or medical debt to consolidate and repay consumer debt and loans to creditors and rebuild positive credit ratings and scores. Contact ([phone] F1555895.  Community clinics in [place] -Health Department [facility]: [address] [place], [place], [address]. [phone].  -Health Department [place] [facility]: [phone]  [address], [facility], [address]. [phone].  -[facility] offers medical care through a group of doctors, pharmacies and other healthcare related agencies that offer services for low income, uninsured adults in [place]. Also offers adult Dental care and assistance with applying for an [employer]. Call [phone].   [facility]. This center provides low-cost health care to those without health insurance. Services offered include an onsite pharmacy. Phone [phone]. [address], [place].  -Medication Assistance Program serves as a link between pharmaceutical companies and patients to provide low cost or free prescription medications. This service is available for residents who meet certain income restrictions and have no insurance coverage. PLEASE CALL [phone] Jonette Nestle) OR [phone] ([place])  -One Step Further: Materials engineer, The [employer] Support & Nutrition Program, [employer]. Call [phone]/ [phone].  Food pantry and assistance -Urban Ministry-Food Bank: [address], [place] [address]. Phone [phone]  -Blessed Table Food Pantry: [address], [place], [place] [address]. [phone].  -Missionary Ministry: has the purpose of visiting the sick and shut-ins and provide for needs in the surrounding communities. Call [phone]. Email: [email or web address] This program provides: Food box for seniors, Financial assistance, Food to meet basic  nutritional needs.  -Meals on Wheels with Senior Resources: [facility] residents age 43 and over who are homebound and unable to obtain and prepare a nutritious meal for themselves are eligible for this service. There may be a waiting list in certain parts of [facility] if the route in that area is full. If you are in [facility] and [place] call [phone] to register. For all other areas call [phone] to register.  -Greater Dietitian: [email or web address]://[email or web address]  TRANSPORTATION: -[employer] Us Department of Health: Call [facility] and [employer] at [phone] for details. AttractionGuides.es  -Access [place]: Access [place] is the [employer] Agency's shared-ride transportation service for eligible riders who have a disability that prevents them from riding the fixed route bus. Call [phone]. Access [place] riders must pay a fare of $1.50 per trip, or may purchase a 10-ride punch card for $14.00 ($1.40 per ride) or a 40-ride punch card for $48.00 ($1.20 per ride).  -The Shepherd's WHEELS rideshare transportation service is provided for senior citizens (60+) who live independently within [place] city limits and are unable to drive or have limited access to transportation. Call [phone] to schedule an appointment.  -[place] Transportation: For Medicare or Medicaid recipients call [phone]?[facility], wheelchair Carloyn Chi, and ambulatory quotes available.   FLEEING VIOLENCE: -Family Services of the [place]- 24/7 Crisis line [phone]) -[facility] Justice Centers: (336) [phone]-[phone])  [place] 2-1-1 is another useful way to locate resources in the community. Visit ShedSizes.ch to find service information online. If you need additional assistance, 2-1-1 Referral Specialists are available 24 hours a day, every day by dialing  2-1-1 or [phone] from any phone. The call is free, confidential, and available in any language.  Affordable Housing Search http://www.nchousingsearch.[facility])   M-F 8a-3p [address] [place]  [place], [place] [address] [phone] Services include: laundry, barbering, support groups, case management, phone & computer access, showers, AA/NA mtgs, mental health/substance abuse nurse, job skills class, disability information, VA assistance, spiritual classes, etc. Winter Shelter available when temperatures are less than 32 degrees.   HOMELESS SHELTERS [place] [facility] Night Shelter at [facility]- Call [phone] ext. [phone]  or ext. [phone]. Located at [address], [place], [place] [address]  [facility] Mens Shelter- Call [phone]. Located at [address], [place] [address].  [facility]- [employer]. Call [phone]. Office located at [address], [place] [address].  Pathways Family Housing through [place] [phone].  [facility] Family Shelter- Call [phone]. Located at [address] [place], [place], [place] [address].  Room at the Inn-For Pregnant mothers. Call [phone]. Located at [address] [place], [address].  [facility]-For men in [place]. Call [phone]. [facility]-Shelter in [place]. Call [phone].  Home of [employer] for [employer] [phone]. Office located at [address] [place], [address].  [employer] be agreeable to help with chores. Call [phone] ext. [phone].  Men's: [address] [place], [place] [address]. Women's: [facility]  [address] [place], [place] [address]  Crisis Services Therapeutic Alternatives Mobile Crisis Management- [phone]  [facility] [address] [place], [place] [address]. Phone: [phone] Rent/Utility Assistance in  [facility]:  INNOVATIVE PATHWAYS [address] [place], [place] [address] [phone] Mon 8:00am - 6:00pm; Tue 8:00am - 6:00pm; Wed 8:00am - 6:00pm; Thu 8:00am - 6:00pm; Fri 8:00am - 6:00pm; Email: [email or web address] Eligibility: Residents of [place], [place], [place], [place], [place] and [place] that meet income limits. Call or text for eligibility screening.   [facility] MINISTRY [address] [place], [place], [place] [address] [phone] (Main: Rental Assistance) [phone] (Main: Utility Assistance) Mon 8:30am - 5:00pm; Tue 8:30am - 5:00pm; Wed 8:30am - 5:00pm; Thu 8:30am - 5:00pm; Fri 8:30am - 5:00pm; Website: http://www.greensborourbanministry.org/emergency-assistance-program Eligibility: People who have an unexpected crisis or emergency that can be verified. Must have some form of income and meet income limits. At the first of the month, only helps with rent/mortgage assistance for those who have court ordered eviction notices. Call for application information. Call for exact documents that will be needed. Examples of documents that may be needed: Photo ID, Social Security cards for everyone in the household, and proof of income for previous 2 months. Copy of eviction notice for rent assistance and copy of final notice for utility assistance. Statements or receipts of bills for previous 2 months.   [facility] - [place] [address], [place], [place] [address] [phone] (Main) [phone] (Alternate) Mon 9:00am - 5:00pm; Tue 9:00am - 5:00pm; Wed 9:00am - 5:00pm; Thu 9:00am - 5:00pm; Fri 9:00am - 5:00pm; Website: http://[email or web address].salvationarmy.org/[place]/emergency-financial-assistance Email: [email or web address] Eligibility: People experiencing a housing crisis with past-due rent and/or utilities and meet income limits. Must be willing to take part in 6 Call or visit website to download  application. Return complete application by mail or email only. Documents: Help with Utilities: Photo ID, proof of household income, copies of monthly bills or receipts, and a final disconnection/shut-off notice. Help with Rent or Mortgage: Photo ID, proof of income, copies of monthly bills or receipts, and eviction notice. Help with Household Goods: Photo ID, proof of household income, copies of monthly bills or receipts, and a fire or flood report.  [facility] - [place] [address] [place], [place] [address] [phone] (Main) Mon 8:00am - 5:00pm; Tue 8:00am - 5:00pm; Wed 8:00am - 5:00pm; Thu 8:00am - 5:00pm; Fri 8:00am - 12:00pm; Website: http://[email or web address].salvationarmy.org/[place]/emergency-financial-assistance Email: [email or web address] Call for eligibility information. Apply :Utilities Assistance: Visit office by 8:30am on 1st and 4th Monday of each  month to pick up application. Rent and Mortgage Assistance: Visit office by 8:30am on 2nd and 3rd Monday of each month to pick up application. NOTE: If Monday falls on a holiday applications can be picked up the following Tuesday. Documents required will be listed on application.  [facility] [place] [facility] [phone] (Main) Seen by appointment only. Call for more information. Eligibility: Meet income limits. Apply: Call for information on how to schedule an appointment. Each month there is a specific day to call to schedule an appointment. It is stated on the agency voicemail message. Appointments fill up quickly each month. Documents: Photo ID, copy of current utility bill.  [facility] HANDS [place] [address] [place], [place] [address] [phone] (Main) Tue 9:00am - 4:00pm; Wed 9:00am - 4:00pm; Thu 9:00am - 4:00pm; Website: http://www.[email or web address] Email: [email or web address]@gmail [email or web address] Eligibility: Utility Assistance: Meet income limits and be a Haematologist. [employer] customers do not qualify. Must not have received utility assistance for another agency within the last 90 days. Rent Assistance: Residents of [place] who meet income limits. Must not have received rent assistance for another agency within the last 90 days. Apply: Call to schedule an appointment. Documents: Utility Assistance: Photo ID, City of [employer], copy of lease (if not paying a mortgage), proof of income, and monthly expenses. Rent Assistance: Photo ID, W-9 from the landlord, copy of the lease, proof of income, and a list of monthly expenses.  OPEN DOOR MINISTRIES - [place] [address] [place], [place] [address] [phone] (Main: Help With Rent) [phone] (Main: Help With Utilities) Mon 9:00am - 4:00pm; Tue 9:00am - 4:00pm; Wed 9:00am - 4:00pm; Thu 9:00am - 4:00pm; Fri 9:00am - 4:00pm; Website: MotivationalSites.no Email: [email or web address] Eligibility: People experiencing a financial crisis. Apply: Call to schedule an appointment Wednesday, 7:30am. Documents: Photo ID, Social Security card, proof of income, and proof of address. Other documents may be required, depending on service. Call for more information.  LOW INCOME ENERGY ASSISTANCE PROGRAM DEPARTMENT OF SOCIAL SERVICES - [facility] [address], [place], [place] [address] [phone] (Main) Mon 8:00am - 5:00pm; Tue 8:00am - 5:00pm; Wed 8:00am - 5:00pm; Thu 8:00am - 5:00pm; Fri 8:00am - 5:00pm; Website: [email or web address] Eligibility: Meet income limits and resource guidelines. Each household is only eligible once, even if multiple members apply. Apply: Call to see if funds are available. Visit to complete an application, call to have 1 mailed, or apply online at [email or web address] NOTE: Households with a person age 60  and over or a person with a documented disability can apply beginning December 1. Other households can apply beginning January 1. Documents: Photo ID, birth certificate, proof of household income, copy of utility bill, latest bank statement, the names and Social Security numbers for everyone in the household, and proof of disability if under age 17.  LOW INCOME ENERGY ASSISTANCE PROGRAM DEPARTMENT OF SOCIAL SERVICES - [facility] [address] [place], [place], [place] [address] [phone] (Main) Mon 8:00am - 5:00pm; Tue 8:00am - 5:00pm; Wed 8:00am - 5:00pm; Thu 8:00am - 5:00pm; Fri 8:00am - 5:00pm; Website: [email or web address] Eligibility: Meet income limits and resource guidelines. Each household is only eligible once, even if multiple members apply. Apply: Call to see if funds are available. Visit to complete an application, call to have 1 mailed, or apply online at [email or web address] NOTE: Households with a person age 8 and over or a person with a documented  disability can apply beginning December 1. Other households can apply beginning January 1. Documents: Photo ID, birth certificate, proof of household income, copy of utility bill, latest bank statement, the names and Social Security numbers for everyone in the household, and proof of disability if under age 36.  [facility]  [place] [facility]ISTRY Address: [address] [place], [place] [address] Phone Number: [phone] Hours of Operation: Residents of [place] can come to obtain food Monday through Friday from 8:30am until 3:30pm. Photo ID and Social Security cards required for all residents of a household. Can come six times a year  THE BLESSED TABLE Address: [address] [place], [place] [address] Phone Number: [phone] Hours of Operation: Operates Tuesday-Friday 10:00 a.m. to 1 p.m. Requirements: Referral from DSS needed. May come 6 times a  year, 30 days apart. Photo ID and SS required for all residents of household.  [facility] MINISTRIES Address: [address] [place], [place] [address] Phone Number: [phone] Hours of Operation: Food pantry is open on the last Saturday of each month from 10:00 am - 12:00 noon. No appointment needed. No qualifications.  [facility] Address: [address] [place], [place] [address] Phone Number: [phone]. [phone] Hours of Operation: Must make reservations to pick up food on Saturdays. Sign ups for Saturday pick up beginning at 8:30 a.m. on Monday morning.  [facility] Address: [address] [place], [place] [address] Phone Number: [phone] Hours of Operation: If you need food, bring proper identification such as a driver's license to receive a bag of food once a month. Requirements: Can come once every 30 days with referral DSS, Holiday representative, Mental health etc. Each referral good for six visits. Photo ID required. *1st visit no referral required.  [facility] Address: [address] [place], [place] [address] Phone Number: [phone]  [place] [facility] Address: [address] [place], [place] [address] Phone Number: [phone] Hours of Operation:  You can register at [email or web address] for free groceries  FREE INDEED FOOD PANTRY Address: [address] [place], [place] [address] Phone Number: [phone] Hours of Operation: Drive through giveaway, first come first served. Every 3rd Saturday 11AM - 1PM  [facility] [address] Address: [address] [place] [address] Phone Number: [phone]   [place]  HAND TO HAND FOOD PANTRY Address: [address] [place], [place] [address] Phone Number: [phone] Hours of Operation: Once a month every 3rd Saturday  [facility] Address: [address] [place], [place] [address] Phone Number: [phone] Hours of  Operation: Distribution happens from 9:00-10:00 a.m. every Saturday.     HELPING HANDS Address: [address] [facility] [address] [place], [place] [address] Phone Number: [phone] Hours of Operation: ONCE a week for the community food distribution held every Tuesday, Wednesday and Thursday from 11 a.m. - 2:00 p.m. Food is available on a first come, first serve basis and varies week to week. No appointment necessary for drive thru pick up.  [facility] Address: [address] [place], [place] [address] Phone Number: [phone] Hours of Operation: Open every 3rd Thursday 9:30 a.m. - 11:00 a.m.  HOPE [facility] Address: [address] [place],  [address] Phone Number: [phone] Hours of Operation: Please call for hours, directions, and questions  [facility] [place] [facility] Address: [address] [place], [place]  [address] Phone Number: [phone] Website: https://www.Hollyguns.co.za Food Finder app: https://findfood.ghpfa.org  [employer] Address: [address]  [place], [place] [address] Phone Number: [phone] Hours of Operation: Contact Bree Harpe. Enrolled Substance Abuse Clients Only  [facility] Address: [address] [place], [address]  Phone Number: [phone] Hours of Operation: Contact Alene Ana. Food pantry open the 3rd Saturday of each month from 9 a.m. -12 p.m. only  [place] [facility] Address: [address] [place], [place] [address] Phone Number: [phone] Hours of Operation: Contact Loletta Ripple. Emergency food bank open on Saturdays by appointment only  [facility] Address: [address] [place], [place] [address] Phone Number: [phone] Hours of Operation: No specific contact person; Anyone can help  [facility] Address: [address] [place], [place] [address] Phone Number: [phone] Hours of Operation: Contact Julia Oats. Agency  gives out a bag of food every Thursday from 2-4 p.m. only, and also provides a community meal every Thursday between 5-6 p.m. Other services provided include rent/mortgage and utility assistance, women's winter shelter, thrift store, and senior adult activities.  [facility] [place] Address: [address] [place], [place] [address] Phone Number: [phone] Hours of Operation: The Emergency Food Assistance Program provides individuals and families with a generous supply of food including meat, fresh vegetables, and nonperishable items. The food box contains five days' worth of food, and each family or individual can receive a box once per month. M, W, Th, Fr 11am-2pm, walk-ins welcome.  [facility] Address: [address] [place], [place] [address]  Phone Number: [phone] Hours of Operation: Contact Asia Blanca Bunch. Tuesdays and Thursdays from 11am - 3pm by appointment only  Sunday, by APPOINTMENT ONLY  [address] of [place], [address] [place], [address], [phone], 3.2 mi from [facility], call in advance for appointment at 10:00am or at 4:00pm, must provide valid photo ID  Monday  9:30am-5:00pm [facility], [address] [place] [id number], [phone], 0.9 mi from [facility], can come four times per year, bring your photo ID and SS cards for other residents of household, will make appointments for those who work and need to come after 5pm  10:00am-12:00noon [employer], [address], [address], [phone], 1.7 mi from [facility], can come once every 60 days per household, need referral from DSS, [employer], etc., bring photo ID and SS card   10:00am-1:00pm [employer] the [facility],  [address] [place], [address], [phone], 7.7 mi from [facility], can come once every thirty days with a referral from DSS, [employer], Mental  Health, etc. -- each referral good for six visits, bring photo ID   10:00am-1:00pm [address], [address], [phone], 4.2 mi from [facility], can come once every 6 months, open to [facility] residents, bring photo ID and copy of a current utility bill in your name, please call first to verify that food is available  6:30pm-8:30pm [facility], [address] [address], [phone], 3.2 mi from [facility], can come once every 30 days, maximum 6 times per year, bring your photo ID and SS numbers for other residents of household  Monday by APPOINTMENT ONLY  [facility], [address] [place], [address],  [phone], 2.5 mi from [facility], call in advance for appointment between 10:00am-2:00pm, bring your photo ID and  SS cards for all residents of household, can come once every 3 months  [facility], [address], [address], [phone], 0.7 mi from [facility], call in advance for appointment, can come once every 30 days, bring your photo ID and SS cards for other residents of household   Tuesday  9:00am-12:00noon [employer], [address], [address], [phone], 1.3 mi from [facility], can come once every 3 months, bring your photo ID and SS numbers for other residents of household   9:00am-1:00pm [facility] [address],  [place], [address], [phone], 1.6 mi from [facility], can come once every two weeks  9:30am-5:00pm [employer], [address] [place] [phone], [phone], 0.9 mi from [facility], can come four times per year, bring your photo ID and SS cards for other residents of household, will make appointments for those who work and need to come after 5pm  10:00am-12:00noon [employer], [address]  [place], [address], [phone], 1.7 mi from [facility], can come once every 60 days per household, need referral  from DSS, [employer], etc., bring photo ID and SS card  10:00am-1:00pm [facility], [id number],  [phone], 3.8 mi from [facility], with referral from DSS, may come six times, 30 days apart, bring your photo ID and SS cards for all residents of household   10:00am-1:00pm [address] the [facility], [address] [place], [address], [phone], 7.7 mi from [facility], can come once every thirty days with a referral from DSS, [employer], Mental Health, etc.- each referral good for six visits, bring photo ID   10:00am-1:00pm [address] [address], [phone], 4.2 mi from [facility], can come once every 6 months, open to [facility] residents, bring photo ID and copy of a current utility bill in your name, please call first to verify that food is available  2:00pm-3:30pm [facility], [address], [phone], [phone], 3.7 mi from [facility], can come twelve times per year, one bag per family, bring photo ID   FIRST AND THIRD Tuesdays  10:00am-1:00pm, [address] [place], [address], [phone], 7.2 mi from [facility], can come once every 30 days   Tuesday, WHEN FOOD IS AVAILABLE (call)  12:00noon-2:00pm [facility], [address], [address], [phone], 1.5 mi from  [facility], can come once every 30 days, bring photo ID   Tuesday, by APPOINTMENT ONLY  [facility], [address], [address],  [phone], 2.5 mi from [facility], call in advance for appointment between 1:00pm-4:00pm, bring your photo ID and SS cards for all residents of household, can come once every 3 months   [address] of [address], [address], [address], [phone], 5 mi from [facility], call one day ahead for appointment the next day between 10:00am and  12:00noon, can come once every 3 months, bring your photo ID and must qualify according to family income   [facility], [address] [address], [phone], 0.7 mi from [facility], call in advance for appointment, can come once every 30 days, bring your photo ID and SS cards for other residents of household  [address], [address], [address],  [phone], 5.1 mi from [facility], call between 9:00am and 1:00pm M-F to make appointment. Appointments are scheduled for Tues and Thurs from 10:00am-11:30am, bring photo ID, can come once every 6 months, limit three visits over 18 months, then must have referral  Wednesday  9:30am-5:00pm [facility], [address] [place] [phone], [phone], 0.9 mi from [facility], can come four times per year, bring your photo ID and SS cards for other residents of household, will make appointments for those who work and need to come after 5pm  9:30am-11:30am [employer] [facility], [address], [address], [phone], 6.6 mi from [facility], can come once every 30 days, bring your photo ID, and SS cards for other residents of household, each monthly visit requires a written referral from GUM or DSS with number in household on form  10:00am-12:00noon [address], [address] [place]  [place], [address], [phone], 1.7 mi from [facility], can come once every 60 days per household, need referral from DSS, [employer], etc., bring photo ID and SS card  10:00am-1:00pm [facility], [id number],  [phone], 3.9 mi from [facility], with referral from DSS, may come six times, 30 days apart, bring your photo ID and SS cards for all residents of household   10:00am-1:00pm [address] the [facility], [address] [place], [address], [phone], 7.7 mi from [facility], can come once every thirty days with a  referral from DSS, [employer], Mental Health, etc. - each referral good for six visits, bring photo ID   2:00pm-5:45pm [address] [place], [address], [phone], 3.2 mi from [facility], once every 30 days, first come/first served, limited to first 25, bring photo ID   6:30pm-8:30pm [facility], [address] [address], [phone], 3.2 mi from [facility], can come once every 30 days, maximum 6 times per year, bring your photo ID and SS numbers for other residents of household  FIRST and THIRD Wednesdays   9:00am-12:00noon, [facility], [address], [address], [phone], 4.2 mi from [facility], can come once a month. Please arrive and sign in no later than 11:15 so everyone can be served by 12 noon.  THIRD Wednesday  1:30pm-3:00pm, [address] [place], [address], [phone] or [phone], 2.1 mi from [facility]  Wednesday, by APPOINTMENT ONLY  [address] [facility], [address], [address], [phone], 5 mi from [facility], call one day ahead for appointment the next day between 10:00am and 12:00noon, can come once every 3 months, bring your photo ID and must qualify according to family income   [facility], [address] [address], [phone], 0.7 mi from [facility], call in advance for appointment, can come once every 30 days, bring your photo ID and SS cards for other residents of household   [address] of [place], [address] [place], [address], [phone], 3.2 mi from [facility], call in advance for appointment at 7:00pm, must provide valid photo ID  Thursday  9:00am-12:00noon [employer], [address], [address], [phone], 1.3 mi from [facility], can come once every 3 months, bring your photo ID and SS  numbers for other residents of household   9:00am-1:00pm [facility] [address],  [place],  [address], [phone]/Ext [phone], 1.6 mi from [facility], can come once every two weeks  9:30am-12:00noon [facility], [address], [address], [phone], 4.5 mi from [facility], can come once every 30 days, must state income [closed Thanksgiving and week of Christmas]  9:30am-5:00pm [employer], [address] [place] [phone], [phone], 0.9 mi from [facility], can come four times per year, bring your photo ID and SS card for other residents of household, will make appointments for those who work and need to come after 5pm  10:00am-1:00pm [employer], [address] [place], [address],  [phone], 3.9 mi from [facility], with referral from DSS, may come six times, 30 days apart, bring your photo ID and SS cards for all residents of household   10:00am-1:00pm [address] the [facility], [address] [place], [address], [phone], 7.7 mi from [facility], can come once every thirty days with a referral from DSS, [employer], Mental Health, etc.- each referral good for six visits, bring photo ID   10:00am-1:00pm [facility], [address], [address], [phone], 4.2 mi from [facility], can come once every 6 months, open to [facility] residents, bring photo ID and copy of a current utility bill in your name, please call first to verify that food is available   SUNDAYS BREAKFAST TWO LOCATIONS: 8:00am served in [facility] by Awaken PPL Corporation 8:30am SHUTTLE provided from [facility], served at [employer], [address] LUNCH TWO LOCATIONS [plus one additional third Sunday only] 10:30am - 12:30pm served at [employer], [employer], [place] [address] (1.2 miles from [facility]) 12:30pm served in [place] by Land O'Lakes Team (THIRD Sunday only) 1:30pm served at [facility] by [facility] one  location [plus one additional third Sunday only] 5:00pm Every Sunday, served under the bridge at [address] by Lige Reeve Under the 3M Company (.7 miles from [facility]) (THIRD Sunday ONLY) 4:00pm served in the parking garage, across from [employer] Corporation, corner of [place] and [place] by Ryland Group Works Ministries MONDAYS BREAKFAST 7:30am served in [employer] by the United States [employer] Corporation and Friends LUNCH 10:30am - 12:30pm served at [employer], [employer], [place] [address] (1.2 miles from [facility]) DINNER TWO LOCATIONS: 7:00pm served in front of the courthouse at the corner of Goldman Sachs and Phelps Dodge. by [place] Monday Night Meal (3 blocks from [facility]) 4:30pm served at the [employer], [address] by Bank of New York Company (0.6 miles from [facility] ) TUESDAYS BREAKFAST 8:00am - 9:00am served at The TJX Companies, [address] (0.3 miles from [place]) LUNCH 10:30am - 12:30pm served at the [employer], [employer] [address], (1.2 miles from [place]) DINNER 6:00pm served at CSX Corporation, enter from Capital One and go to the Sonic Automotive, (0.7 miles from [place]) [facility] BREAKFAST 7:00am - 8:00am served at [employer], [employer] [address] (1.2 miles from [place]) LUNCH ONE LOCATION [plus two additional locations listed below] 10:30am - 12:30pm served at [employer], [employer] [address], (1.2 miles from [place]) (FIRST Wednesday ONLY) 11:30am served at Dillard's, [place] [address] (6.6 miles from [place]) (SECOND Wednesday ONLY) 11:00am served at [place]. [place] of [address], [address] (1.3 miles from [place]) [place] TWO LOCATIONS 6:00pm served at W. R. Berkley, [place] W. Visteon Corporation.  (1.3 miles from [facility]) 4:00pm - 6:00pm (hot dogs and chips) served at Levi Strauss of [facility], [address] (1.7 miles from [place]) [place] BREAKFAST NOT AVAILABLE AT THIS TIME LUNCH 10:30am - 12:30pm served at [employer], [employer], [place] [address], (1.2 miles from [place]) DINNER 6:00pm served at [employer] Corporation, enter from Capital One and go to the Sonic Automotive, (0.7 miles from N[employer]or Corporation) [place] BREAKFAST NOT AVAILABLE AT THIS TIME LUNCH 10:30am - 12:30pm served at [employer], [employer] [address] (1.2 miles from [place]) DINNER TWO LOCATIONS, [plus one additional first Friday only] 6:00pm served under the bridge at [address] by Lige Reeve Under [employer]. (.7 miles from [facility]) 5:00pm - 7:00pm served at Levi Strauss of [facility], [address] (1.7 miles from [place]) (FIRST Friday ONLY) 5:45 pm - SHUTTLE provided from the LIBRARY at 5:45pm. Served at [facility], [address]. SATURDAYS BREAKFAST TWO LOCATIONS [plus one additional last Saturday only] 8:00am served at [facility] by Delphi 8:30am served at [employer], [address] (2.2 miles from [facility]) (LAST Saturday ONLY) 8:30am served at [employer], [address] (5 miles from [place]) LUNCH 10:30am - 12:30pm served at [employer], [employer] [address] (1.2 miles from [facility]) DINNER 6:00pm served under the bridge at [address] by World Fuel Services Corporation (0.7 miles from Nucor Corporation)  DIRECTIONS FROM [place] TO ALL MEAL LOCATIONS The Bridge at [address] (.7 miles from [address]) [address] on [place]. Turn Right onto DIRECTV 433  ft. Continue onto [address] under bridge, about 500 ft. Courthouse (3 blocks from [facility]) [place] on [address]. Turn right on [place]  1 block to PPL Corporation (.5 miles from Banks) [place] on [place]. YRC Worldwide. past Brink's Company to EMCOR. Enter from Capital One and go to the Affiliated Computer Services building W. R. Berkley [address] Visteon Corporation. (1.3 miles from [facility]) [address] on [place]. Turn Right onto [address]. Melanie [address]. church will be on the Left. The TJX Companies [address] (.3 miles from [facility]) Go .3 miles on W. [address] Destination is on your right Dillard's at ONEOK (6.6 miles from [address]) [address] on [place] toward W [address] Turn right onto [address] Continue onto Alcoa Inc. Continue onto Toll Brothers. 5. Latina Pol is on right Sanmina-SCI Futures trader) [address] (.6 miles from [place]) [place] on [place]. [address] Turn Left onto [address] 0.3 miles Destination is on the Left. [place] Black & Decker at American Express (5 miles from Nucor Corporation) 1. Head south on [address]. Turn right onto W  Friend[address] Turn slightly left onto Becton, Dickinson and Company onto Quest Diagnostics Turn right at Barnes & Noble Continue to church on right New Birth Sounds of [facility] [address] (1.7 miles from [place]) [address] on [place] 1.4 miles [place] becomes [place]. [address] Continue 0.6 miles and church will be on theright. [place] Guardian Life Insurance at [address] (2.5 miles from Nucor Corporation) [place] provided from Massachusetts Mutual Life Park] [place] on [place]. Elm toward Estée Lauder right onto Costco Wholesale left onto Emerson Electric Turn left onto Micron Technology [address] [place]  [address] (2.2 miles from [address]) [address] on [place] 1.4 miles [place] becomes [place]. Elm  [address] Turn right onto [address] [place]  [address] and church will be on the Left. Potter[place] House/[place] AT&T [address] (1.2 miles from [facility]) 1.Turn right onto [facility] [place] 2.Turn left onto [place] 3.[place] 4.Destination is on your right [place]. [place] of [address] at ToysRus (1.3 miles from [facility]) [address] on [address] Turn left onto Genuine Parts right onto S. [place] Col[place]. Continue onto KB Home	[place]. Turn left onto Smurfit-Stone Container. Turn right onto WellPoint.

## 2024-06-05 NOTE — Treatment Plan (Deleted)
 Possible discharge based on surgery assessment.  Please hold off on transfer upstairs until discussion with attending.

## 2024-06-05 NOTE — Progress Notes (Signed)
 PT asked for a diet to be changed. Made pt aware he was NPO with sips of meds. PT stated  if he couldn't have anything to drink he would drink water from the sink This nurse told pt she would message the MD but would not advise drinking the sink water.

## 2024-06-05 NOTE — Progress Notes (Signed)
" ° °  Brief Progress Note   _____________________________________________________________________________________________________________  Patient Name: Luis [name] Patient DOB: Mar 26, 1961 Date: @TODAY @      Data: Reviewed notes, labs, VS.    Action: No action needed at this time.      Response:    _____________________________________________________________________________________________________________  The [facility], [facility], [facility] RN Expeditor Sharolyn JONETTA Batman Please contact us  directly via secure chat (search for [facility]) or by calling us  at [phone] [facility]).  "

## 2024-06-05 NOTE — Progress Notes (Addendum)
 " PROGRESS NOTE    Luis [name]  FMW:[id number] DOB: [birth year] DOA: 06/04/2024 PCP: Elnor [name], NP  Chief Complaint  Patient presents with   Colostomy bag problem    Brief Narrative:   Luis [name] is Luis [name] 63 y.o. 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  Who presented to the ED as his colostomy bag fell off and he was not able to reattach it. Also noted pain to the site.  Had CT scan with findings concerning for intraabdominal abscesses.   Surgery c/s pending.   Assessment & Plan:   Principal Problem:   Abscess Active Problems:   Essential hypertension   DM2 (diabetes mellitus, type 2) (HCC)   Tobacco dependence   Ileostomy in place [facility])  Small Intraabdominal Fluid Collections Concerning for Abscesses History of Complex Ventral Hernia with Ileostomy and excoriation of skin and tissue and chronic nonhealing wound to midline His primary concern was is ostomy bag was unable to be reattached, but also with increasing skin breakdown, erythema, pain at site CT with findings concerning for intraabdominal abscesses Surgery consult pending (discussed with [facility] 12/24) WOC consult pending NPO for now Abx with ceftriaxone , flagyl    Leukocytosis Mild, will monitor  Chronic HFpEF Last echo done in August 2025 showing EF 50 to 55%, trivial mitral regurgitation.   Does not appear volume overload on exam.  Continue torsemide , losartan , metop.   Atrial flutter Hx CHB s/p Pacemaker Not on anticoagulation due to history of bleeding with ostomy and falls. metoprolol    COPD Stable, no signs of acute exacerbation.  Continue home duonebs,  pharmacy sub for stiolto.   Type 2 diabetes Hemoglobin A1c 8.7 on 05/31/2024.   Continue long-acting insulin  at 1/2 of usual dose as patient is currently NPO.  Placed on sensitive sliding scale insulin  every 4 hours.   History of alcohol abuse Patient states he completely stopped drinking alcohol 3 or 4 months ago.  No signs of withdrawal at this time.  Will monitor.   Hypertension Continue metoprolol , losartan , and torsemide .   Hyperlipidemia Continue Lipitor and Vascepa .   Gout Continue allopurinol .   Chronic pain/neuropathy Continue gabapentin .   Tobacco abuse Patient has been counseled to stop smoking cigarettes.  He has declined nicotine  patch.     DVT prophylaxis: SCD Code Status: full Family Communication: none Disposition:   Status is: Observation The patient remains OBS appropriate and will d/c before 2 midnights.   Consultants:  General surgery  Procedures:  none  Antimicrobials:  Anti-infectives (From admission, onward)    Start     Dose/Rate Route Frequency Ordered Stop   06/06/24 0400  cefTRIAXone  (ROCEPHIN ) 2 g in sodium chloride  0.9 % 100 mL IVPB        2 g 200 mL/hr over 30 Minutes Intravenous Every 24 hours 06/05/24 0656     06/05/24 1000  metroNIDAZOLE  (FLAGYL ) IVPB 500 mg        500 mg 100 mL/hr over 60 Minutes Intravenous Every 12 hours 06/05/24 0657     06/05/24 0315  cefTRIAXone  (ROCEPHIN ) 2 g in sodium chloride  0.9 % 100 mL IVPB        2 g 200 mL/hr over 30 Minutes  Intravenous Once 06/05/24 0312 06/05/24 0441   06/05/24 0315  metroNIDAZOLE  (FLAGYL ) IVPB 500 mg        500 mg 100 mL/hr over 60 Minutes Intravenous  Once 06/05/24 0312 06/05/24 0709       Subjective: Asking me to let him sleep  Objective: Vitals:   06/05/24 0003 06/05/24 0344 06/05/24 0700 06/05/24 0755  BP: 136/72 127/81 119/71   Pulse: 63 67 75   Resp: 16 18 13    Temp: 98.7 F (37.1 C) (!) 97.1 F (36.2 C)  98.1 F (36.7 C)  TempSrc:  Oral  Oral  SpO2: 97%  97% 94%   Weight:      Height:       No intake or output data in the 24 hours ending 06/05/24 9061 Filed Weights   06/04/24 1847  Weight: 95.3 kg    Examination:  General exam: Appears calm and comfortable  Respiratory system: unlabored Cardiovascular system: RRR Gastrointestinal system: dressing to abdominal wound - diffuse erythema to abdomen - not particularly TTP Central nervous system: Alert and oriented. No focal neurological deficits. Extremities: no lee   Data Reviewed: I have personally reviewed following labs and imaging studies  CBC: Recent Labs  Lab 05/31/24 1511 06/04/24 1917  WBC 12.4* 14.2*  NEUTROABS  --  10.9*  HGB 13.6 14.6  HCT 41.2 45.8  MCV 98.7 103.4*  PLT 259.0 243    Basic Metabolic Panel: Recent Labs  Lab 05/31/24 1511 06/04/24 1917  NA 138 139  K 4.3 4.3  CL 102 102  CO2 29 24  GLUCOSE 132* 148*  BUN 42* 40*  CREATININE 1.46 1.41*  CALCIUM  10.6* 10.3    GFR: Estimated Creatinine Clearance: 59.8 mL/min (Luis [name]) (by C-G formula based on SCr of 1.41 mg/dL (H)).  Liver Function Tests: Recent Labs  Lab 05/31/24 1511  AST 26  ALT 39  ALKPHOS 254*  BILITOT 0.6  PROT 8.5*  ALBUMIN 4.6    CBG: Recent Labs  Lab 06/05/24 0753  GLUCAP 110*     No results found for this or any previous visit (from the past 240 hours).       Radiology Studies: CT ABDOMEN PELVIS W CONTRAST Result Date: 06/05/2024 EXAM: CT ABDOMEN AND PELVIS WITH CONTRAST 06/05/2024 02:30:00 AM TECHNIQUE: CT of the abdomen and pelvis was performed with the administration of 75 mL of iohexol  (OMNIPAQUE ) 350 MG/ML injection. Multiplanar reformatted images are provided for review. Automated exposure control, iterative reconstruction, and/or weight-based adjustment of the mA/kV was utilized to reduce the radiation dose to as low as reasonably achievable. COMPARISON: CT from 08/06/2023. CLINICAL HISTORY: Abdominal pain, acute, nonlocalized; problem with ostomy.  FINDINGS: LOWER CHEST: No acute abnormality. LIVER: Nodular hepatic contour compatible with cirrhosis. GALLBLADDER AND BILE DUCTS: Gallbladder is unremarkable. No biliary ductal dilatation. SPLEEN: No acute abnormality. PANCREAS: No acute abnormality. ADRENAL GLANDS: No acute abnormality. KIDNEYS, URETERS AND BLADDER: No stones in the kidneys or ureters. No hydronephrosis. No perinephric or periureteral stranding. Urinary bladder is unremarkable. 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 Stomach demonstrates no acute abnormality. There is no bowel obstruction. PERITONEUM AND RETROPERITONEUM: There is an peripheral enhancing fluid collection measuring 12 mm which may communicate with the greater curvature of the gastric body (series 8, image [id number]). Small soft tissue nodule or fluid collection with adjacent inflammatory stranding measuring 1.9 x 1.6 cm (series 2, image 36) inferior to the gallbladder fossa . No free  air. VASCULATURE: Aortic atherosclerotic calcification. Aorta is normal in caliber. LYMPH NODES: No lymphadenopathy. REPRODUCTIVE ORGANS: No acute abnormality. BONES AND SOFT TISSUES: No acute osseous abnormality. IMPRESSION: 1. Peripheral enhancing small fluid collections inferior to the gallbladder fossa and along the greater curvature of the stomach. These may represent small abscesses. Close interval follow up is recommended. Electronically signed by: Luis Gatlin MD 06/05/2024 02:57 AM EST RP Workstation: [id number]        Scheduled Meds:  allopurinol   100 mg Oral Daily   arformoterol   15 mcg Nebulization BID   And   umeclidinium bromide   1 puff Inhalation Daily   atorvastatin   10 mg Oral Daily   gabapentin   600 mg Oral TID   icosapent  Ethyl  2 g Oral BID   insulin  aspart  0-9 Units Subcutaneous Q4H   insulin  glargine  5 Units Subcutaneous Daily   losartan   25 mg Oral Daily   metoprolol  succinate  25 mg Oral  Daily   torsemide   40 mg Oral Daily   Continuous Infusions:  [START ON 06/06/2024] cefTRIAXone  (ROCEPHIN )  IV     metronidazole        LOS: 0 days    Time spent: over 30 min     Luis Monte, MD [facility]   To contact the attending provider between 7A-7P or the covering provider during after hours 7P-7A, please log into the web site [email or web address] and access using universal [place] password for that web site. If you do not have the password, please call the hospital operator.  06/05/2024, 9:38 AM    "

## 2024-06-05 NOTE — H&P (Signed)
 " History and Physical    Argenis Kumari FMW:[id number] DOB: 1960/06/28 DOA: 06/04/2024  PCP: Elnor [name], NP  Patient coming from: Home  Chief Complaint: Ostomy problem  HPI: Luis [name] is a 63 y.o. 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  Patient presents to the ED as his colostomy bag fell off and he was not able to reattach it.  He is having increasing skin breakdown, erythema, irritation, and burning pain at the site.  Denies fevers, nausea, or vomiting.  Reports chronic shortness of breath related to having history of COPD and no change from his baseline.  Denies chest pain.  He completely stopped drinking alcohol 3 or 4 months ago.  Still smokes 1/2 pack of cigarettes daily.  ED Course: Vital signs stable.  Labs notable for WBC count 14.2, glucose 148, creatinine 1.4 (stable), lactic acid normal.  CT abdomen pelvis showing small fluid collections inferior to the gallbladder fossa and along the greater curvature of the stomach which may represent small abscesses.  Patient was given ceftriaxone , metronidazole , and morphine .  Review of Systems:  Review of Systems  All other systems reviewed and are negative.   Past Medical History:  Diagnosis Date   Acute respiratory failure with hypoxia (HCC) 12/23/2022   Alcohol withdrawal syndrome, with delirium (HCC) 12/27/2022   COPD (chronic obstructive pulmonary disease) (HCC)    Diabetes mellitus without complication (HCC)    Diverticulitis    DVT (deep venous thrombosis) (HCC)    x3   Encephalopathy acute 12/28/2022   ETOH abuse    H/O  colectomy    Hyperlipidemia    Hypertension    Sepsis associated hypotension (HCC) 08/03/2019   Smoker     Past Surgical History:  Procedure Laterality Date   COLOSTOMY     PACEMAKER LEADLESS INSERTION N/A 12/31/2020   Procedure: PACEMAKER LEADLESS INSERTION;  Surgeon: Cindie Ole DASEN, MD;  Location: [facility];  Service: Cardiovascular;  Laterality: N/A;   TONSILLECTOMY       reports that he has been smoking cigarettes. He started smoking about 49 years ago. He has a 36.7 pack-year smoking history. He uses smokeless tobacco. He reports current alcohol use of about 4.0 standard drinks of alcohol per week. He reports current drug use. Drug: Marijuana.  Allergies[1]  Family History  Problem Relation Age of Onset   COPD Mother    Prostate cancer Father    Colon cancer Neg Hx    Rectal cancer Neg Hx    Stomach cancer Neg Hx    Esophageal cancer Neg Hx     Prior to Admission medications  Medication Sig Start Date End Date Taking? Authorizing Provider  acetaminophen  (TYLENOL ) 325 MG tablet Take 325-500 mg by mouth every 8 (eight) hours as needed for moderate pain (pain score 4-6).    [provider]  allopurinol  (ZYLOPRIM ) 100 MG tablet Take 1 tablet (100 mg total) by mouth daily. 02/23/24   Elnor [name], NP  ascorbic acid  (VITAMIN C ) 500 MG tablet Take 1 tablet (500 mg total) by mouth daily. 01/01/23     atorvastatin  (LIPITOR) 10 MG tablet Take 1 tablet (  10 mg total) by mouth daily. 02/23/24   Elnor [name], NP  cetirizine  (ZYRTEC ) 5 MG tablet Take 5 mg by mouth daily.    [provider]  ciclopirox  (PENLAC ) 8 % solution Apply topically at bedtime over nail and surrounding skin. Apply daily over previous coat. After seven (7) days, remove with alcohol and continue cycle. 04/23/24   Gershon Donnice SAUNDERS, DPM  colchicine  0.6 MG tablet Take 1 tablet (0.6 mg total) by mouth daily. 04/15/24   Gershon Donnice SAUNDERS, DPM  Continuous Glucose Receiver (DEXCOM G7 RECEIVER)  DEVI Use as directed. 03/18/24   Alvia Corean CROME, FNP  dapagliflozin  propanediol (FARXIGA ) 10 MG TABS tablet Take 1 tablet (10 mg total) by mouth daily before breakfast. 02/23/24   Elnor [name], NP  fluticasone  (FLOVENT  HFA) 110 MCG/ACT inhaler Inhale 1 puff into the lungs in the morning and at bedtime. 12/16/23   Danton Reyes DASEN, MD  folic acid  (FOLVITE ) 1 MG tablet Take 1 tablet (1 mg total) by mouth daily. 05/16/24   Elnor [name], NP  gabapentin  (NEURONTIN ) 300 MG capsule Take 2 capsules (600 mg total) by mouth 3 (three) times daily. 04/11/24 12/07/24  Sikora, Rebecca, DPM  guaiFENesin  (MUCINEX ) 600 MG 12 hr tablet Take 1 tablet (600 mg total) by mouth 2 (two) times daily as needed to loosen phlegm or cough. 10/11/23   Elnor [name], NP  hydrOXYzine  (ATARAX ) 25 MG tablet Take 25 mg by mouth at bedtime as needed.    [provider]  icosapent  Ethyl (VASCEPA ) 1 g capsule Take 2 capsules (2 g total) by mouth 2 (two) times daily. 05/31/24   Elnor [name], NP  insulin  glargine (LANTUS  SOLOSTAR) 100 UNIT/ML Solostar Pen Inject 10 Units into the skin daily. 04/11/24   Elnor [name], NP  Insulin  Pen Needle 32G X 4 MM MISC Use once daily to administer insulin  04/11/24   Elnor [name], NP  ipratropium-albuterol  (DUONEB) 0.5-2.5 (3) MG/3ML SOLN Take 3 mLs by nebulization every 6 (six) hours as needed (wheezing / sob).    [provider]  losartan  (COZAAR ) 25 MG tablet Take 1 tablet (25 mg total) by mouth daily. 02/23/24   Elnor [name], NP  metFORMIN  (GLUCOPHAGE ) 500 MG tablet Take 1 tablet (500 mg total) by mouth 2 (two) times daily with a meal. 04/11/24   Elnor [name], NP  metoprolol  succinate (TOPROL -XL) 25 MG 24 hr tablet Take 1 tablet (25 mg total) by mouth daily. 05/23/24   Elnor [name], NP  Semaglutide ,0.25 or 0.5MG /DOS, (OZEMPIC , 0.25 OR 0.5 MG/DOSE,) 2 MG/3ML SOPN Inject 0.25 mg into the skin once a week. Patient not taking: Reported on 05/31/2024 04/05/24   Elnor [name], NP   Tiotropium Bromide -Olodaterol 2.5-2.5 MCG/ACT AERS Inhale 2 puffs into the lungs daily. 02/23/24   Elnor [name], NP  torsemide  40 MG TABS Take 40 mg by mouth daily. 04/26/24   Elnor [name], NP  triamcinolone  cream (KENALOG ) 0.5 % Apply topically to legs twice daily for up to 2 weeks as needed for eczema 08/23/23   Elnor [name], NP    Physical Exam: Vitals:   06/04/24 1847 06/04/24 2049 06/05/24 0003 06/05/24 0344  BP:  (!) 142/79 136/72 127/81  Pulse:  71 63 67  Resp:  18 16 18   Temp:  98 F (36.7 C) 98.7 F (37.1 C) (!) 97.1 F (36.2 C)  TempSrc:    Oral  SpO2:  (!) 85% 97% 97%  Weight: 95.3 kg     Height: 6' 1 (1.854 m)       Physical Exam Vitals reviewed.  Constitutional:      General: He is not in acute distress. HENT:     Head: Normocephalic and atraumatic.  Eyes:     Extraocular Movements: Extraocular movements intact.  Cardiovascular:     Rate and Rhythm: Normal rate and regular rhythm.     Heart sounds: Normal heart sounds.  Pulmonary:     Effort: Pulmonary effort is normal. No respiratory distress.     Breath sounds: Normal breath sounds. No wheezing, rhonchi or rales.  Abdominal:     General: Bowel sounds are normal.     Palpations: Abdomen is soft.     Tenderness: There is no abdominal tenderness. There is no guarding.     Comments: Ostomy bag with brown stool.  Large area of abdominal wall erythema and skin breakdown but no obvious drainage.  See image.   Musculoskeletal:     Cervical back: Normal range of motion.     Right lower leg: No edema.     Left lower leg: No edema.  Skin:    General: Skin is warm and dry.  Neurological:     General: No focal deficit present.     Mental Status: He is alert and oriented to person, place, and time.      Labs on Admission: I have personally reviewed following labs and imaging studies  CBC: Recent Labs  Lab 05/31/24 1511 06/04/24 1917  WBC 12.4* 14.2*  NEUTROABS  --  10.9*  HGB 13.6 14.6  HCT 41.2  45.8  MCV 98.7 103.4*  PLT 259.0 243   Basic Metabolic Panel: Recent Labs  Lab 05/31/24 1511 06/04/24 1917  NA 138 139  K 4.3 4.3  CL 102 102  CO2 29 24  GLUCOSE 132* 148*  BUN 42* 40*  CREATININE 1.46 1.41*  CALCIUM  10.6* 10.3   GFR: Estimated Creatinine Clearance: 59.8 mL/min (A) (by C-G formula based on SCr of 1.41 mg/dL (H)). Liver Function Tests: Recent Labs  Lab 05/31/24 1511  AST 26  ALT 39  ALKPHOS 254*  BILITOT 0.6  PROT 8.5*  ALBUMIN 4.6   No results for input(s): LIPASE, AMYLASE in the last 168 hours. No results for input(s): AMMONIA in the last 168 hours. Coagulation Profile: No results for input(s): INR, PROTIME in the last 168 hours. Cardiac Enzymes: No results for input(s): CKTOTAL, CKMB, CKMBINDEX, TROPONINI in the last 168 hours. BNP (last 3 results) Recent Labs    12/28/23 1357 03/08/24 1204  PROBNP 100.0 163.0*   HbA1C: No results for input(s): HGBA1C in the last 72 hours. CBG: No results for input(s): GLUCAP in the last 168 hours. Lipid Profile: No results for input(s): CHOL, HDL, LDLCALC, TRIG, CHOLHDL, LDLDIRECT in the last 72 hours. Thyroid  Function Tests: No results for input(s): TSH, T4TOTAL, FREET4, T3FREE, THYROIDAB in the last 72 hours. Anemia Panel: No results for input(s): VITAMINB12, FOLATE, FERRITIN, TIBC, IRON, RETICCTPCT in the last 72 hours. Urine analysis:    Component Value Date/Time   COLORURINE YELLOW 12/13/2023 1311   APPEARANCEUR CLEAR 12/13/2023 1311   LABSPEC 1.011 12/13/2023 1311   PHURINE 5.0 12/13/2023 1311   GLUCOSEU NEGATIVE 12/13/2023 1311   GLUCOSEU 100 (A) 10/05/2023 1201   HGBUR NEGATIVE 12/13/2023 1311   BILIRUBINUR NEGATIVE 12/13/2023 1311   KETONESUR NEGATIVE 12/13/2023 1311   PROTEINUR NEGATIVE 12/13/2023 1311   UROBILINOGEN 0.2 10/05/2023 1201  NITRITE NEGATIVE 12/13/2023 1311   LEUKOCYTESUR NEGATIVE 12/13/2023 1311    Radiological  Exams on Admission: CT ABDOMEN PELVIS W CONTRAST Result Date: 06/05/2024 EXAM: CT ABDOMEN AND PELVIS WITH CONTRAST 06/05/2024 02:30:00 AM TECHNIQUE: CT of the abdomen and pelvis was performed with the administration of 75 mL of iohexol  (OMNIPAQUE ) 350 MG/ML injection. Multiplanar reformatted images are provided for review. Automated exposure control, iterative reconstruction, and/or weight-based adjustment of the mA/kV was utilized to reduce the radiation dose to as low as reasonably achievable. COMPARISON: CT from 08/06/2023. CLINICAL HISTORY: Abdominal pain, acute, nonlocalized; problem with ostomy. FINDINGS: LOWER CHEST: No acute abnormality. LIVER: Nodular hepatic contour compatible with cirrhosis. GALLBLADDER AND BILE DUCTS: Gallbladder is unremarkable. No biliary ductal dilatation. SPLEEN: No acute abnormality. PANCREAS: No acute abnormality. ADRENAL GLANDS: No acute abnormality. KIDNEYS, URETERS AND BLADDER: No stones in the kidneys or ureters. No hydronephrosis. No perinephric or periureteral stranding. Urinary bladder is unremarkable. 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 Stomach demonstrates no acute abnormality. There is no bowel obstruction. PERITONEUM AND RETROPERITONEUM: There is an peripheral enhancing fluid collection measuring 12 mm which may communicate with the greater curvature of the gastric body (series 8, image [id number]). Small soft tissue nodule or fluid collection with adjacent inflammatory stranding measuring 1.9 x 1.6 cm (series 2, image 36) inferior to the gallbladder fossa . No free air. VASCULATURE: Aortic atherosclerotic calcification. Aorta is normal in caliber. LYMPH NODES: No lymphadenopathy. REPRODUCTIVE ORGANS: No acute abnormality. BONES AND SOFT TISSUES: No acute osseous abnormality. IMPRESSION: 1. Peripheral enhancing small fluid collections inferior to the gallbladder fossa and along the greater  curvature of the stomach. These may represent small abscesses. Close interval follow up is recommended. 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  Lactate normal.  Continue ceftriaxone  and metronidazole .  Trend WBC count.  Keep n.p.o. for now and consult general surgery in the morning.  Chronic HFpEF Last echo done in August 2025 showing EF 50 to 55%, trivial mitral regurgitation.  Does not appear volume overload on exam.  Continue home meds.  Atrial flutter Not on anticoagulation due to history of bleeding with ostomy and falls.  COPD Stable, no signs of acute exacerbation.  Continue home meds.  Type 2 diabetes Hemoglobin A1c 8.7 on 05/31/2024.  Continue long-acting insulin  at 1/2 of usual dose as patient is currently NPO.  Placed on sensitive sliding scale insulin  every 4 hours.  History of alcohol abuse Patient states he completely stopped drinking alcohol 3 or 4 months ago.  No signs of withdrawal at this time.  Hypertension Stable.  Continue metoprolol , losartan , and torsemide .  Hyperlipidemia Continue Lipitor and Vascepa .  Gout Continue allopurinol .  Chronic pain/neuropathy Continue gabapentin .  Tobacco abuse Patient has been counseled to stop smoking cigarettes.  He has declined nicotine  patch.  DVT prophylaxis: SCDs Code Status: Full Code (discussed with the patient) Level of care: Telemetry bed Admission status: It is my clinical opinion that referral for OBSERVATION is reasonable and necessary in this patient based on the above information provided. The aforementioned taken together are felt to place the patient at high risk for further clinical deterioration. However, it is anticipated that  the patient may be  medically stable for discharge from the hospital within 24 to 48 hours.  Editha Ram MD [facility]  If 7PM-7AM, please contact night-coverage [email or web address]  06/05/2024, 5:26 AM       [1]  Allergies Allergen Reactions   Codeine Hives   Lisinopril Other (See Comments)    Other reaction(s): renal effects   "

## 2024-06-06 DIAGNOSIS — E1169 Type 2 diabetes mellitus with other specified complication: Secondary | ICD-10-CM

## 2024-06-06 DIAGNOSIS — J449 Chronic obstructive pulmonary disease, unspecified: Secondary | ICD-10-CM

## 2024-06-06 DIAGNOSIS — E785 Hyperlipidemia, unspecified: Secondary | ICD-10-CM | POA: Diagnosis not present

## 2024-06-06 DIAGNOSIS — K659 Peritonitis, unspecified: Secondary | ICD-10-CM | POA: Diagnosis not present

## 2024-06-06 DIAGNOSIS — L0291 Cutaneous abscess, unspecified: Secondary | ICD-10-CM | POA: Diagnosis not present

## 2024-06-06 DIAGNOSIS — I10 Essential (primary) hypertension: Secondary | ICD-10-CM

## 2024-06-06 LAB — COMPREHENSIVE METABOLIC PANEL WITH GFR
ALT: 26 U/L (ref 0–44)
AST: 26 U/L (ref 15–41)
Albumin: 4 g/dL (ref 3.5–5.0)
Alkaline Phosphatase: 251 U/L — ABNORMAL HIGH (ref 38–126)
Anion gap: 13 (ref 5–15)
BUN: 39 mg/dL — ABNORMAL HIGH (ref 8–23)
CO2: 22 mmol/L (ref 22–32)
Calcium: 9.7 mg/dL (ref 8.9–10.3)
Chloride: 99 mmol/L (ref 98–111)
Creatinine, Ser: 1.88 mg/dL — ABNORMAL HIGH (ref 0.61–1.24)
GFR, Estimated: 39 mL/min — ABNORMAL LOW
Glucose, Bld: 118 mg/dL — ABNORMAL HIGH (ref 70–99)
Potassium: 4.1 mmol/L (ref 3.5–5.1)
Sodium: 134 mmol/L — ABNORMAL LOW (ref 135–145)
Total Bilirubin: 0.8 mg/dL (ref 0.0–1.2)
Total Protein: 7.6 g/dL (ref 6.5–8.1)

## 2024-06-06 LAB — CBC WITH DIFFERENTIAL/PLATELET
Abs Immature Granulocytes: 0.02 K/uL (ref 0.00–0.07)
Basophils Absolute: 0.1 K/uL (ref 0.0–0.1)
Basophils Relative: 1 %
Eosinophils Absolute: 0.1 K/uL (ref 0.0–0.5)
Eosinophils Relative: 1 %
HCT: 39 % (ref 39.0–52.0)
Hemoglobin: 13 g/dL (ref 13.0–17.0)
Immature Granulocytes: 0 %
Lymphocytes Relative: 5 %
Lymphs Abs: 0.6 K/uL — ABNORMAL LOW (ref 0.7–4.0)
MCH: 33.2 pg (ref 26.0–34.0)
MCHC: 33.3 g/dL (ref 30.0–36.0)
MCV: 99.7 fL (ref 80.0–100.0)
Monocytes Absolute: 0.6 K/uL (ref 0.1–1.0)
Monocytes Relative: 6 %
Neutro Abs: 9.7 K/uL — ABNORMAL HIGH (ref 1.7–7.7)
Neutrophils Relative %: 87 %
Platelets: 195 K/uL (ref 150–400)
RBC: 3.91 MIL/uL — ABNORMAL LOW (ref 4.22–5.81)
RDW: 14.1 % (ref 11.5–15.5)
WBC: 11.1 K/uL — ABNORMAL HIGH (ref 4.0–10.5)
nRBC: 0 % (ref 0.0–0.2)

## 2024-06-06 LAB — MAGNESIUM: Magnesium: 1.8 mg/dL (ref 1.7–2.4)

## 2024-06-06 LAB — PHOSPHORUS: Phosphorus: 4 mg/dL (ref 2.5–4.6)

## 2024-06-06 LAB — GLUCOSE, CAPILLARY
Glucose-Capillary: 114 mg/dL — ABNORMAL HIGH (ref 70–99)
Glucose-Capillary: 132 mg/dL — ABNORMAL HIGH (ref 70–99)
Glucose-Capillary: 137 mg/dL — ABNORMAL HIGH (ref 70–99)
Glucose-Capillary: 165 mg/dL — ABNORMAL HIGH (ref 70–99)

## 2024-06-06 MED ORDER — AMOXICILLIN-POT CLAVULANATE 875-125 MG PO TABS: 1.0000 | ORAL_TABLET | Freq: Two times a day (BID) | ORAL | 0 refills | Status: AC

## 2024-06-06 NOTE — Progress Notes (Addendum)
 Received call from Dr. Perri regarding CT scan.  Briefly this is a 63 y.o. 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 Elsie Carlin Hof III at [facility].  Patient reported to present after colostomy bag fell off and he was not able to reattach it with  increasing skin breakdown, erythema, irritation, and burning pain at the site.  CT A/P showed peripheral enhancing small fluid collections inferior to the gallbladder fossa and along the greater curvature of the stomach. No free air.  WBC 11.1 HDS currently.  He is currently on a regular diet I reviewed the images with my attending. No current intervention indicated at this time. General surgery will be available as needed. Defer abx and repeat imaging to primary team.  Recommend follow up with his surgeon at [facility].  Please call back with any changes, questions or concerns.  I called and discussed above with Dr. Perri.  Luis [name] , [facility] 06/06/2024, 8:44 AM Please see Amion for pager number during day hours 7:00am-4:30pm

## 2024-06-06 NOTE — Plan of Care (Signed)

## 2024-06-06 NOTE — Plan of Care (Signed)
" °  Problem: Education: Goal: Ability to describe self-care measures that may prevent or decrease complications (Diabetes Survival Skills Education) will improve Outcome: Progressing Goal: Individualized Educational Video(s) Outcome: Progressing   Problem: Coping: Goal: Ability to adjust to condition or change in health will improve Outcome: Progressing   Problem: Health Behavior/Discharge Planning: Goal: Ability to identify and utilize available resources and services will improve Outcome: Progressing Goal: Ability to manage health-related needs will improve Outcome: Progressing   Problem: Metabolic: Goal: Ability to maintain appropriate glucose levels will improve Outcome: Progressing   Problem: Metabolic: Goal: Ability to maintain appropriate glucose levels will improve Outcome: Progressing   Problem: Education: Goal: Knowledge of General Education information will improve Description: Including pain rating scale, medication(s)/side effects and non-pharmacologic comfort measures Outcome: Progressing   Problem: Clinical Measurements: Goal: Cardiovascular complication will be avoided Outcome: Progressing   Problem: Elimination: Goal: Will not experience complications related to bowel motility Outcome: Progressing Goal: Will not experience complications related to urinary retention Outcome: Progressing   "

## 2024-06-06 NOTE — Discharge Summary (Signed)
 " Physician Discharge Summary   Patient: Luis [name] MRN: [id number] DOB: 14-Dec-1960  Admit date:     06/04/2024  Discharge date: 06/06/2024  Discharge Physician: Sabas GORMAN Brod   PCP: Elnor [name], NP   Recommendations at discharge:   Follow-up general surgery at [facility] for further recommendations Continue Augmentin  1 tablet p.o. twice daily for 7 days  Discharge Diagnoses: Principal Problem:   Abscess Active Problems:   Essential hypertension   DM2 (diabetes mellitus, type 2) (HCC)   Tobacco dependence   Ileostomy in place [facility])  Resolved Problems:   * No resolved hospital problems. *  Hospital Course:   63 y.o. 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  Patient presents to the ED as his colostomy bag fell off and he was not able to reattach it.  He is having increasing skin breakdown, erythema, irritation, and burning pain at the site.  Denies fevers, nausea, or vomiting.  Reports chronic shortness of breath related to having history of COPD and no change from his baseline.  Denies chest pain.  He completely stopped drinking alcohol 3 or 4 months ago.  Still smokes 1/2 pack of cigarettes daily.   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  Lactate normal.   Patient was started on ceftriaxone  and Flagyl  -General Surgery was consulted, they reviewed images.  No need for any intervention as per general  surgery. -Patient can follow-up with general surgeon at [facility] as outpatient for further recommendations -Will discharge on Augmentin  1 tablet p.o. twice daily for 7 more days    Chronic HFpEF Last echo done in August 2025 showing EF 50 to 55%, trivial mitral regurgitation.  \ Does not appear volume overload on exam.  Continue home meds.   Atrial flutter Not on anticoagulation due to history of bleeding with ostomy and falls.   COPD Stable, no signs of acute exacerbation.  Continue home meds.   Type 2 diabetes Hemoglobin A1c 8.7 on 05/31/2024.  Continue home medications  History of alcohol abuse Patient states he completely stopped drinking alcohol 3 or 4 months ago.   No signs of withdrawal at this time.   Hypertension Stable.  Continue metoprolol , losartan , and torsemide .   Hyperlipidemia Continue Lipitor and Vascepa .   Gout Continue allopurinol .   Chronic pain/neuropathy Continue gabapentin .   Tobacco abuse Patient has been counseled to stop smoking cigarettes.           Consultants: General surgery Procedures performed: None Disposition: Home Diet recommendation:  Regular diet DISCHARGE MEDICATION: Allergies as of 06/06/2024       Reactions   Codeine Hives   Lisinopril Other (See Comments)   Other reaction(s): renal effects        Medication List     TAKE these medications    acetaminophen  500 MG tablet Commonly known as: TYLENOL  Take 500 mg by mouth every 8 (eight) hours as needed for moderate pain (pain score 4-6).   allopurinol  100 MG tablet Commonly known  as: ZYLOPRIM  Take 1 tablet (100 mg total) by mouth daily.   amoxicillin -clavulanate 875-125 MG tablet Commonly known as: AUGMENTIN  Take 1 tablet by mouth 2 (two) times daily for 7 days.   ascorbic acid  500 MG tablet Commonly known as: VITAMIN C  Take 1 tablet (500 mg total) by mouth daily.   atorvastatin  10 MG tablet Commonly known as: LIPITOR Take 1 tablet (10 mg total) by  mouth daily.   cetirizine  5 MG tablet Commonly known as: ZYRTEC  Take 5 mg by mouth daily.   ciclopirox  8 % solution Commonly known as: PENLAC  Apply topically at bedtime over nail and surrounding skin. Apply daily over previous coat. After seven (7) days, remove with alcohol and continue cycle.   dapagliflozin  propanediol 10 MG Tabs tablet Commonly known as: Farxiga  Take 1 tablet (10 mg total) by mouth daily before breakfast.   Dexcom G7 Receiver [facility] Use as directed.   fluticasone  110 MCG/ACT inhaler Commonly known as: FLOVENT  HFA Inhale 1 puff into the lungs in the morning and at bedtime.   folic acid  1 MG tablet Commonly known as: FOLVITE  Take 1 tablet (1 mg total) by mouth daily.   gabapentin  300 MG capsule Commonly known as: NEURONTIN  Take 2 capsules (600 mg total) by mouth 3 (three) times daily.   guaiFENesin  600 MG 12 hr tablet Commonly known as: Mucinex  Take 1 tablet (600 mg total) by mouth 2 (two) times daily as needed to loosen phlegm or cough. What changed: when to take this   hydrOXYzine  25 MG tablet Commonly known as: ATARAX  Take 25 mg by mouth at bedtime as needed.   icosapent  Ethyl 1 g capsule Commonly known as: Vascepa  Take 2 capsules (2 g total) by mouth 2 (two) times daily.   Insupen Pen Needles 32G X 4 MM Misc Generic drug: Insulin  Pen Needle Use once daily to administer insulin    ipratropium-albuterol  0.5-2.5 (3) MG/3ML Soln Commonly known as: DUONEB Take 3 mLs by nebulization every 6 (six) hours as needed (wheezing / sob).   Lantus  SoloStar 100 UNIT/ML Solostar Pen Generic drug: insulin  glargine Inject 10 Units into the skin daily.   losartan  25 MG tablet Commonly known as: COZAAR  Take 1 tablet (25 mg total) by mouth daily.   metFORMIN  500 MG tablet Commonly known as: GLUCOPHAGE  Take 1 tablet (500 mg total) by mouth 2 (two) times daily with a meal.   metoprolol  succinate 25 MG 24 hr tablet Commonly known as: TOPROL -XL Take 1 tablet  (25 mg total) by mouth daily.   Ozempic  (0.25 or 0.5 MG/DOSE) 2 MG/3ML Sopn Generic drug: Semaglutide (0.25 or 0.5MG /DOS) Inject 0.25 mg into the skin once a week.   Stiolto Respimat  2.5-2.5 MCG/ACT Aers Generic drug: Tiotropium Bromide -Olodaterol Inhale 2 puffs into the lungs daily.   Torsemide  40 MG Tabs Take 40 mg by mouth daily.   triamcinolone  cream 0.5 % Commonly known as: KENALOG  Apply topically to legs twice daily for up to 2 weeks as needed for eczema        Discharge Exam: Filed Weights   06/04/24 1847  Weight: 95.3 kg   General-appears in no acute distress Heart-S1-S2, regular, no murmur auscultated Lungs-clear to auscultation bilaterally, no wheezing or crackles auscultated Abdomen-soft, nontender, no organomegaly Extremities-no edema in the lower extremities Neuro-alert, oriented x3, no focal deficit noted  Condition at discharge: good  The results of significant diagnostics from this hospitalization (including imaging, microbiology, ancillary and laboratory) are listed below for reference.   Imaging Studies: CT ABDOMEN PELVIS  W CONTRAST Result Date: 06/05/2024 EXAM: CT ABDOMEN AND PELVIS WITH CONTRAST 06/05/2024 02:30:00 AM TECHNIQUE: CT of the abdomen and pelvis was performed with the administration of 75 mL of iohexol  (OMNIPAQUE ) 350 MG/ML injection. Multiplanar reformatted images are provided for review. Automated exposure control, iterative reconstruction, and/or weight-based adjustment of the mA/kV was utilized to reduce the radiation dose to as low as reasonably achievable. COMPARISON: CT from 08/06/2023. CLINICAL HISTORY: Abdominal pain, acute, nonlocalized; problem with ostomy. FINDINGS: LOWER CHEST: No acute abnormality. LIVER: Nodular hepatic contour compatible with cirrhosis. GALLBLADDER AND BILE DUCTS: Gallbladder is unremarkable. No biliary ductal dilatation. SPLEEN: No acute abnormality. PANCREAS: No acute abnormality. ADRENAL GLANDS: No acute  abnormality. KIDNEYS, URETERS AND BLADDER: No stones in the kidneys or ureters. No hydronephrosis. No perinephric or periureteral stranding. Urinary bladder is unremarkable. 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 Stomach demonstrates no acute abnormality. There is no bowel obstruction. PERITONEUM AND RETROPERITONEUM: There is an peripheral enhancing fluid collection measuring 12 mm which may communicate with the greater curvature of the gastric body (series 8, image [id number]). Small soft tissue nodule or fluid collection with adjacent inflammatory stranding measuring 1.9 x 1.6 cm (series 2, image 36) inferior to the gallbladder fossa . No free air. VASCULATURE: Aortic atherosclerotic calcification. Aorta is normal in caliber. LYMPH NODES: No lymphadenopathy. REPRODUCTIVE ORGANS: No acute abnormality. BONES AND SOFT TISSUES: No acute osseous abnormality. IMPRESSION: 1. Peripheral enhancing small fluid collections inferior to the gallbladder fossa and along the greater curvature of the stomach. These may represent small abscesses. Close interval follow up is recommended. Electronically signed by: Norman Gatlin MD 06/05/2024 02:57 AM EST RP Workstation: [id number]    Microbiology: Results for orders placed or performed during the hospital encounter of 12/12/23  MRSA Next Gen by PCR, Nasal     Status: Abnormal   Collection Time: 12/13/23  1:13 PM   Specimen: Nasal Mucosa; Nasal Swab  Result Value Ref Range Status   MRSA by PCR Next Gen DETECTED (A) NOT DETECTED Final    Comment: RESULT CALLED TO, READ BACK BY AND VERIFIED WITH: RN EMERSON HOLLAND [phone] @[phone]  FH (NOTE) The GeneXpert MRSA Assay (FDA approved for NASAL specimens only), is one component of a comprehensive MRSA colonization surveillance program. It is not intended to diagnose MRSA infection nor to guide or monitor treatment for MRSA infections. Test performance is not  FDA approved in patients less than 71 years old. Performed at [facility], [address] [place], [place] [address]     Labs: CBC: Recent Labs  Lab 05/31/24 1511 06/04/24 1917 06/05/24 1047 06/06/24 0352  WBC 12.4* 14.2* 10.6* 11.1*  NEUTROABS  --  10.9*  --  9.7*  HGB 13.6 14.6 13.1 13.0  HCT 41.2 45.8 41.0 39.0  MCV 98.7 103.4* 101.5* 99.7  PLT 259.0 243 235 195   Basic Metabolic Panel: Recent Labs  Lab 05/31/24 1511 06/04/24 1917 06/06/24 0352  NA 138 139 134*  K 4.3 4.3 4.1  CL 102 102 99  CO2 29 24 22   GLUCOSE 132* 148* 118*  BUN 42* 40* 39*  CREATININE 1.46 1.41* 1.88*  CALCIUM  10.6* 10.3 9.7  MG  --   --  1.8  PHOS  --   --  4.0   Liver Function Tests: Recent Labs  Lab 05/31/24 1511 06/06/24 0352  AST 26 26  ALT 39 26  ALKPHOS 254* 251*  BILITOT 0.6 0.8  PROT 8.5* 7.6  ALBUMIN 4.6 4.0   CBG: Recent Labs  Lab 06/05/24 1549 06/05/24 2026 06/06/24 0021 06/06/24 0451 06/06/24 0806  GLUCAP 94 132* 137* 114* 132*    Discharge time spent: greater than 30 minutes.  Signed: Sabas GORMAN Brod, MD [facility] 06/06/2024 "

## 2024-06-06 NOTE — Progress Notes (Signed)
 Pt alert and oriented. Lighter was taken from pt because he was smoking previous night. Pt too anxiously to go home refuses return of lighter.

## 2024-06-06 NOTE — Progress Notes (Incomplete)
 [facility]  PROGRESS NOTE  Luis [name] FMW:[id number] DOB: January 03, 1961 DOA: 06/04/2024 PCP: Elnor [name], NP   Brief HPI:   63 y.o. 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  Who presented to the ED as his colostomy bag fell off and he was not able to reattach it. Also noted pain to the site.   Had CT scan with findings concerning for intraabdominal abscesses.    Surgery c/s pending.       Assessment/Plan:   Small Intraabdominal Fluid Collections Concerning for Abscesses History of Complex Ventral Hernia with Ileostomy and excoriation of skin and tissue and chronic nonhealing wound to midline His primary concern was is ostomy bag was unable to be reattached, but also with increasing skin breakdown, erythema, pain at site CT with findings concerning for intraabdominal abscesses Surgery consult pending (discussed with [facility] 12/24) WOC consult pending NPO for now Abx with ceftriaxone , flagyl    Leukocytosis Mild, will monitor   Chronic HFpEF Last echo done in August 2025 showing EF 50 to 55%, trivial mitral regurgitation.   Does not appear volume overload on exam.  Continue torsemide , losartan , metop.   Atrial flutter Hx CHB s/p Pacemaker Not on anticoagulation due to history of bleeding with ostomy and falls. metoprolol    COPD Stable, no signs of acute exacerbation.  Continue home duonebs, pharmacy sub for stiolto.   Type 2 diabetes Hemoglobin A1c 8.7 on 05/31/2024.   Continue long-acting insulin  at 1/2 of usual dose as patient is currently NPO.  Placed on sensitive sliding scale insulin  every 4 hours.   History of alcohol  abuse Patient states he completely stopped drinking alcohol 3 or 4 months ago.  No signs of withdrawal at this time.  Will monitor.   Hypertension Continue metoprolol , losartan , and torsemide .   Hyperlipidemia Continue Lipitor and Vascepa .   Gout Continue allopurinol .   Chronic pain/neuropathy Continue gabapentin .   Tobacco abuse Patient has been counseled to stop smoking cigarettes.  He has declined nicotine  patch.      DVT prophylaxis: ***  Medications     allopurinol   100 mg Oral Daily   arformoterol   15 mcg Nebulization BID   And   umeclidinium bromide   1 puff Inhalation Daily   atorvastatin   10 mg Oral Daily   gabapentin   600 mg Oral TID   icosapent  Ethyl  2 g Oral BID   insulin  aspart  0-9 Units Subcutaneous Q4H   insulin  glargine  5 Units Subcutaneous Daily   losartan   25 mg Oral Daily   metoprolol  succinate  25 mg Oral Daily   torsemide   40 mg Oral Daily     Data Reviewed:   CBG:  Recent Labs  Lab 06/05/24 1549 06/05/24 2026 06/06/24 0021 06/06/24 0451 06/06/24 0806  GLUCAP 94 132* 137* 114* 132*    SpO2: 91 %    Vitals:   06/05/24 1943 06/06/24 0020 06/06/24 0425 06/06/24 0801  BP:  (!) 126/95 100/64 107/71  Pulse: (!) 105 67 89 88  Resp:    16  Temp:  99.2 F (37.3 C) 99.1 F (37.3 C)   TempSrc:      SpO2: 99% 94% 92% 91%  Weight:  Height:          Data Reviewed:  Basic Metabolic Panel: Recent Labs  Lab 05/31/24 1511 06/04/24 1917 06/06/24 0352  NA 138 139 134*  K 4.3 4.3 4.1  CL 102 102 99  CO2 29 24 22   GLUCOSE 132* 148* 118*  BUN 42* 40* 39*  CREATININE 1.46 1.41* 1.88*  CALCIUM  10.6* 10.3 9.7  MG  --   --  1.8  PHOS  --   --  4.0    CBC: Recent Labs  Lab 05/31/24 1511 06/04/24 1917 06/05/24 1047 06/06/24 0352  WBC 12.4* 14.2* 10.6* 11.1*  NEUTROABS  --  10.9*  --  9.7*  HGB 13.6 14.6 13.1 13.0  HCT 41.2 45.8 41.0 39.0  MCV 98.7 103.4* 101.5* 99.7  PLT 259.0 243 235 195    LFT Recent Labs   Lab 05/31/24 1511 06/06/24 0352  AST 26 26  ALT 39 26  ALKPHOS 254* 251*  BILITOT 0.6 0.8  PROT 8.5* 7.6  ALBUMIN 4.6 4.0     Antibiotics: Anti-infectives (From admission, onward)    Start     Dose/Rate Route Frequency Ordered Stop   06/06/24 0400  cefTRIAXone  (ROCEPHIN ) 2 g in sodium chloride  0.9 % 100 mL IVPB        2 g 200 mL/hr over 30 Minutes Intravenous Every 24 hours 06/05/24 0656     06/05/24 1000  metroNIDAZOLE  (FLAGYL ) IVPB 500 mg        500 mg 100 mL/hr over 60 Minutes Intravenous Every 12 hours 06/05/24 0657     06/05/24 0315  cefTRIAXone  (ROCEPHIN ) 2 g in sodium chloride  0.9 % 100 mL IVPB        2 g 200 mL/hr over 30 Minutes Intravenous Once 06/05/24 0312 06/05/24 0441   06/05/24 0315  metroNIDAZOLE  (FLAGYL ) IVPB 500 mg        500 mg 100 mL/hr over 60 Minutes Intravenous  Once 06/05/24 9687 06/05/24 0709        CONSULTS ***  Code Status: ***  Family Communication: ***     Subjective   ***   Objective    Physical Examination:   General:  *** Cardiovascular: *** Respiratory: *** Abdomen: *** Extremities: ***  Neurologic:  ***             Luis [name]   [facility] If 7PM-7AM, please contact night-coverage at [email or web address], Office  [phone]   06/06/2024, 9:55 AM  LOS: 0 days

## 2024-06-06 NOTE — Progress Notes (Addendum)
 01:00am: Patient admitted to smoking in the room. This RN collected his match (lighter) and stored it in the patient's chart. Stated that the stress of the hospital caused him to light one up.

## 2024-06-10 ENCOUNTER — Other Ambulatory Visit: Payer: Self-pay

## 2024-06-10 ENCOUNTER — Other Ambulatory Visit: Payer: Self-pay | Admitting: Nurse Practitioner

## 2024-06-10 ENCOUNTER — Telehealth (HOSPITAL_BASED_OUTPATIENT_CLINIC_OR_DEPARTMENT_OTHER): Payer: Self-pay

## 2024-06-10 ENCOUNTER — Other Ambulatory Visit (HOSPITAL_COMMUNITY): Payer: Self-pay

## 2024-06-10 DIAGNOSIS — E1142 Type 2 diabetes mellitus with diabetic polyneuropathy: Secondary | ICD-10-CM

## 2024-06-10 DIAGNOSIS — E1169 Type 2 diabetes mellitus with other specified complication: Secondary | ICD-10-CM

## 2024-06-10 DIAGNOSIS — K439 Ventral hernia without obstruction or gangrene: Secondary | ICD-10-CM

## 2024-06-10 MED ORDER — LANTUS SOLOSTAR 100 UNIT/ML ~~LOC~~ SOPN
10.0000 [IU] | PEN_INJECTOR | Freq: Every day | SUBCUTANEOUS | 1 refills | Status: DC
  Filled 2024-06-10: qty 15, 150d supply, fill #0

## 2024-06-10 MED ORDER — DEXCOM G7 SENSOR MISC
1.0000 | 3 refills | Status: DC
  Filled 2024-06-10 – 2024-06-14 (×2): qty 3, 30d supply, fill #0

## 2024-06-10 NOTE — Telephone Encounter (Signed)
 Copied from CRM #[id number]. Topic: Clinical - Medication Refill >> Jun 10, 2024 12:59 PM Darshell M wrote: Medication:  Continuous Glucose Sensor (DEXCOM G7 SENSOR) MISC    insulin  glargine (LANTUS  SOLOSTAR) 100 UNIT/ML Solostar Pen  Has the patient contacted their pharmacy? Yes (Agent: If no, request that the patient contact the pharmacy for the refill. If patient does not wish to contact the pharmacy document the reason why and proceed with request.) (Agent: If yes, when and what did the pharmacy advise?)  This is the patient's preferred pharmacy:  [facility] [address] [place] [address] Phone: [phone] Fax: [phone]  Is this the correct pharmacy for this prescription? Yes If no, delete pharmacy and type the correct one.   Has the prescription been filled recently? No  Is the patient out of the medication? No  Has the patient been seen for an appointment in the last year OR does the patient have an upcoming appointment? Yes  Can we respond through MyChart? No  Agent: Please be advised that Rx refills may take up to 3 business days. We ask that you follow-up with your pharmacy.

## 2024-06-10 NOTE — Telephone Encounter (Signed)
 Medication: Dexcom G7 Sensors Able to fill? No Prior authorization required? Yes Co-pay before assistance: N/A

## 2024-06-11 ENCOUNTER — Other Ambulatory Visit (HOSPITAL_COMMUNITY): Payer: Self-pay

## 2024-06-11 ENCOUNTER — Other Ambulatory Visit: Payer: Self-pay

## 2024-06-12 ENCOUNTER — Other Ambulatory Visit: Payer: Self-pay

## 2024-06-12 ENCOUNTER — Other Ambulatory Visit: Payer: Self-pay | Admitting: Nurse Practitioner

## 2024-06-12 ENCOUNTER — Other Ambulatory Visit (HOSPITAL_COMMUNITY): Payer: Self-pay

## 2024-06-12 DIAGNOSIS — J449 Chronic obstructive pulmonary disease, unspecified: Secondary | ICD-10-CM

## 2024-06-12 MED ORDER — GUAIFENESIN ER 600 MG PO TB12
600.0000 mg | ORAL_TABLET | Freq: Two times a day (BID) | ORAL | 1 refills | Status: AC | PRN
  Filled 2024-06-12: qty 180, 90d supply, fill #0

## 2024-06-14 ENCOUNTER — Other Ambulatory Visit (HOSPITAL_COMMUNITY): Payer: Self-pay

## 2024-06-14 NOTE — Telephone Encounter (Unsigned)
 Copied from CRM [id number]. Topic: Clinical - Prescription Issue >> Jun 14, 2024 11:19 AM [name] wrote: Reason for CRM: patient states he left from pharmacy on today notified rx refills can take up to 3 business days  also states it was denied  Glucose Sensor (DEXCOM G7 SENSOR) MISC     insulin  glargine (LANTUS  SOLOSTAR) 100 UNIT/ML Solostar Pen Please contact patient

## 2024-06-17 ENCOUNTER — Other Ambulatory Visit (HOSPITAL_COMMUNITY): Payer: Self-pay

## 2024-06-17 ENCOUNTER — Telehealth (HOSPITAL_COMMUNITY): Payer: Self-pay

## 2024-06-17 NOTE — Telephone Encounter (Signed)
 Patient called back confirming interest in coming back to pulmonary rehab (previously attended 1 session before needed to d/c). Informed him we will do an insurance verification and call him to schedule once that is complete.

## 2024-06-17 NOTE — Telephone Encounter (Signed)
 Attempted f/u call regarding pulmonary rehab- no answer, left message.  Closing referral.

## 2024-06-18 ENCOUNTER — Other Ambulatory Visit (HOSPITAL_COMMUNITY): Payer: Self-pay

## 2024-06-18 ENCOUNTER — Telehealth: Payer: Self-pay

## 2024-06-18 NOTE — Telephone Encounter (Signed)
 Patient calling for update.

## 2024-06-18 NOTE — Telephone Encounter (Unsigned)
 Copied from CRM [id number]. Topic: Clinical - Prescription Issue >> Jun 14, 2024 11:19 AM [name] wrote: Reason for CRM: patient states he left from pharmacy on today notified rx refills can take up to 3 business days  also states it was denied  Glucose Sensor (DEXCOM G7 SENSOR) MISC     insulin  glargine (LANTUS  SOLOSTAR) 100 UNIT/ML Solostar Pen Please contact patient >> Jun 18, 2024 12:53 PM [name] wrote: Pt calling again regarding trying to get a dexcom 7 sensor, very unhappy with answering service and would like to speak with someone in the office.  >> Jun 17, 2024  4:48 PM [name] wrote: Patient is calling again regarding update on his medication  Continuous Glucose Sensor (DEXCOM G7 SENSOR) MISC            insulin  glargine (LANTUS  SOLOSTAR) 100 UNIT/ML Solostar Pen

## 2024-06-18 NOTE — Telephone Encounter (Signed)
 Copied from CRM [id number]. Topic: Clinical - Prescription Issue >> Jun 14, 2024 11:19 AM [name] wrote: Reason for CRM: patient states he left from pharmacy on today notified rx refills can take up to 3 business days  also states it was denied  Glucose Sensor (DEXCOM G7 SENSOR) MISC     insulin  glargine (LANTUS  SOLOSTAR) 100 UNIT/ML Solostar Pen Please contact patient >> Jun 17, 2024  4:48 PM [name] wrote: Patient is calling again regarding update on his medication  Continuous Glucose Sensor (DEXCOM G7 SENSOR) MISC            insulin  glargine (LANTUS  SOLOSTAR) 100 UNIT/ML Solostar Pen

## 2024-06-19 ENCOUNTER — Telehealth (HOSPITAL_COMMUNITY): Payer: Self-pay

## 2024-06-19 NOTE — Telephone Encounter (Signed)
 Attempted to schedule pulmonary rehab- no answer, left message. Sent MyChart message.

## 2024-06-19 NOTE — Telephone Encounter (Signed)
 Pt insurance is active and benefits verified through [facility] Medicaid /[employer] Co-pay 0, DED 0/0 met, out of pocket 0/0 met, co-insurance 0. No pre-authorization required. Rep Terrye HERO 06/19/2024 @ 12:40, REF# [id number]

## 2024-06-19 NOTE — Telephone Encounter (Signed)
 Patient called back to get scheduled in the Pulmonary Rehab Program. Patient will come in for orientation on 1/16 and will attend the 1:15 exercise class.  Sent MyChart message.

## 2024-06-20 ENCOUNTER — Telehealth: Payer: Self-pay | Admitting: Pharmacist

## 2024-06-20 ENCOUNTER — Other Ambulatory Visit: Payer: Self-pay

## 2024-06-20 ENCOUNTER — Other Ambulatory Visit: Payer: Self-pay | Admitting: Nurse Practitioner

## 2024-06-20 ENCOUNTER — Other Ambulatory Visit (HOSPITAL_COMMUNITY): Payer: Self-pay

## 2024-06-20 DIAGNOSIS — E1142 Type 2 diabetes mellitus with diabetic polyneuropathy: Secondary | ICD-10-CM

## 2024-06-20 DIAGNOSIS — E1169 Type 2 diabetes mellitus with other specified complication: Secondary | ICD-10-CM

## 2024-06-20 MED ORDER — LANTUS SOLOSTAR 100 UNIT/ML ~~LOC~~ SOPN
10.0000 [IU] | PEN_INJECTOR | Freq: Every day | SUBCUTANEOUS | 1 refills | Status: AC
  Filled 2024-06-20 – 2024-06-21 (×2): qty 9, 90d supply, fill #0

## 2024-06-20 MED ORDER — DEXCOM G7 SENSOR MISC
1.0000 | 3 refills | Status: DC
  Filled 2024-06-20 – 2024-06-21 (×2): qty 3, 30d supply, fill #0

## 2024-06-20 NOTE — Telephone Encounter (Signed)
 For the clinical information for the Dexcom sensor and receivers, could you please update the dx code to E11.69 due to the insurance is denying them because the current dx code is the one for without long term insulin  use.

## 2024-06-21 ENCOUNTER — Other Ambulatory Visit (HOSPITAL_COMMUNITY): Payer: Self-pay

## 2024-06-21 ENCOUNTER — Encounter: Payer: Self-pay | Admitting: Nurse Practitioner

## 2024-06-21 ENCOUNTER — Telehealth: Payer: Self-pay | Admitting: Pharmacist

## 2024-06-21 NOTE — Telephone Encounter (Signed)
 Appeal has been submitted. Will advise when response is received, please be advised that most companies may take 30 days to make a decision. Appeal letter and supporting clinical documentation have been faxed to [phone] on 06/21/2024 @1 :35 pm.  Thank you, Devere Pandy, PharmD Clinical Pharmacist  [place]  Direct Dial: [phone]

## 2024-06-21 NOTE — Telephone Encounter (Signed)
 Medication was send in yesterday 06/20/24

## 2024-06-21 NOTE — Telephone Encounter (Signed)
 Denied. Diagnosis coded updated and an official appeal will be submitted and documented in a separate encounter.

## 2024-06-23 ENCOUNTER — Other Ambulatory Visit: Payer: Self-pay | Admitting: Nurse Practitioner

## 2024-06-23 ENCOUNTER — Other Ambulatory Visit (HOSPITAL_COMMUNITY): Payer: Self-pay

## 2024-06-23 DIAGNOSIS — G4700 Insomnia, unspecified: Secondary | ICD-10-CM

## 2024-06-24 ENCOUNTER — Ambulatory Visit: Attending: Surgery | Admitting: Surgery

## 2024-06-24 ENCOUNTER — Other Ambulatory Visit: Payer: Self-pay

## 2024-06-24 ENCOUNTER — Other Ambulatory Visit (HOSPITAL_COMMUNITY): Payer: Self-pay

## 2024-06-24 ENCOUNTER — Encounter: Payer: Self-pay | Admitting: Surgery

## 2024-06-24 ENCOUNTER — Other Ambulatory Visit: Payer: Self-pay | Admitting: Nurse Practitioner

## 2024-06-24 VITALS — BP 142/82 | HR 62 | Temp 98.1°F | Resp 20 | Ht 73.0 in | Wt 212.7 lb

## 2024-06-24 DIAGNOSIS — I70213 Atherosclerosis of native arteries of extremities with intermittent claudication, bilateral legs: Secondary | ICD-10-CM | POA: Diagnosis not present

## 2024-06-24 MED ORDER — CETIRIZINE HCL 5 MG PO TABS
5.0000 mg | ORAL_TABLET | Freq: Every day | ORAL | 2 refills | Status: AC
  Filled 2024-06-24: qty 30, 30d supply, fill #0

## 2024-06-24 MED ORDER — HYDROXYZINE HCL 25 MG PO TABS
25.0000 mg | ORAL_TABLET | Freq: Every evening | ORAL | 2 refills | Status: AC | PRN
  Filled 2024-06-24: qty 30, 30d supply, fill #0

## 2024-06-24 NOTE — Progress Notes (Signed)
 "                                   Vascular and Vein Specialist of [facility]  Patient name: Luis [name] MRN: [id number] DOB: August 25, 1960 Sex: male   REQUESTING PROVIDER:    Donnice Fees   REASON FOR CONSULT:    PAD  HISTORY OF PRESENT ILLNESS:   Luis [name] is a 64 y.o. male, who is who is referred for bilateral leg complaints and PAD.  He describes a burning stabbing feeling in both of his feet.  He also reports lack of sensation on his feet.  He has been taking Neurontin  which has helped.  He denies any claudication symptoms.  He does not have any active ulcers  He does suffer from diabetes which he is working on controlling better.  He has a history of postoperative DVT.  He is not on anticoagulation.  He suffers from COPD and is a current smoker.  He takes a statin for hypercholesterolemia.  He is medically managed for hypertension.  PAST MEDICAL HISTORY    Past Medical History:  Diagnosis Date   Acute respiratory failure with hypoxia (HCC) 12/23/2022   Alcohol withdrawal syndrome, with delirium (HCC) 12/27/2022   COPD (chronic obstructive pulmonary disease) (HCC)    Diabetes mellitus without complication (HCC)    Diverticulitis    DVT (deep venous thrombosis) (HCC)    x3   Encephalopathy acute 12/28/2022   ETOH abuse    H/O colectomy    Hyperlipidemia    Hypertension    Sepsis associated hypotension (HCC) 08/03/2019   Smoker      FAMILY HISTORY   Family History  Problem Relation Age of Onset   COPD Mother    Prostate cancer Father    Colon cancer Neg Hx    Rectal cancer Neg Hx    Stomach cancer Neg Hx    Esophageal cancer Neg Hx     SOCIAL HISTORY:   Social History   Socioeconomic History   Marital status: Single    Spouse name: Not on file   Number of children: 1   Years of education: Not on file   Highest education level: 12th grade  Occupational History   Occupation: Pt states not able to work  Tobacco Use   Smoking status: Every Day     Current packs/day: 0.75    Average packs/day: 0.8 packs/day for 49.0 years (36.8 ttl pk-yrs)    Types: Cigarettes    Start date: 49   Smokeless tobacco: Current   Tobacco comments:    Less than .5pk daily 10/03/23  Vaping Use   Vaping status: Never Used  Substance and Sexual Activity   Alcohol use: Yes    Alcohol/week: 4.0 standard drinks of alcohol    Types: 4 Shots of liquor per week    Comment: vodka   Drug use: Yes    Types: Marijuana    Comment: pt states he smokes hemp   Sexual activity: Not Currently  Other Topics Concern   Not on file  Social History Narrative   Watches TV, has a son who travels a lot, stated if he needed anything his son would help out   Social Drivers of Health   Tobacco Use: High Risk (06/24/2024)   Patient History    Smoking Tobacco Use: Every Day    Smokeless Tobacco Use: Current    Passive Exposure: Not on  file  Financial Resource Strain: Not on file  Food Insecurity: Food Insecurity Present (06/05/2024)   Epic    Worried About Programme Researcher, Broadcasting/film/video in the Last Year: Sometimes true    Ran Out of Food in the Last Year: Sometimes true  Transportation Needs: No Transportation Needs (06/05/2024)   Epic    Lack of Transportation (Medical): No    Lack of Transportation (Non-Medical): No  Physical Activity: Not on file  Stress: Stress Concern Present (04/18/2023)   [employer] of Occupational Health - Occupational Stress Questionnaire    Feeling of Stress : To some extent  Social Connections: Unknown (06/05/2024)   Social Connection and Isolation Panel    Frequency of Communication with Friends and Family: Once a week    Frequency of Social Gatherings with Friends and Family: Never    Attends Religious Services: Never    Database Administrator or Organizations: No    Attends Banker Meetings: Never    Marital Status: Patient declined  Catering Manager Violence: Not At Risk (06/05/2024)   Epic    Fear of Current or  Ex-Partner: No    Emotionally Abused: No    Physically Abused: No    Sexually Abused: No  Depression (PHQ2-9): Medium Risk (10/25/2023)   Depression (PHQ2-9)    PHQ-2 Score: 6  Alcohol Screen: Not on file  Housing: Low Risk (06/05/2024)   Epic    Unable to Pay for Housing in the Last Year: No    Number of Times Moved in the Last Year: 0    Homeless in the Last Year: No  Utilities: At Risk (06/05/2024)   Epic    Threatened with loss of utilities: Yes  Health Literacy: Not on file    ALLERGIES:    Allergies[1]  CURRENT MEDICATIONS:    Current Outpatient Medications  Medication Sig Dispense Refill   acetaminophen  (TYLENOL ) 500 MG tablet Take 500 mg by mouth every 8 (eight) hours as needed for moderate pain (pain score 4-6).     allopurinol  (ZYLOPRIM ) 100 MG tablet Take 1 tablet (100 mg total) by mouth daily. 90 tablet 3   ascorbic acid  (VITAMIN C ) 500 MG tablet Take 1 tablet (500 mg total) by mouth daily. 30 tablet 1   atorvastatin  (LIPITOR) 10 MG tablet Take 1 tablet (10 mg total) by mouth daily. 90 tablet 3   cetirizine  (ZYRTEC ) 5 MG tablet Take 1 tablet (5 mg total) by mouth daily. 30 tablet 2   ciclopirox  (PENLAC ) 8 % solution Apply topically at bedtime over nail and surrounding skin. Apply daily over previous coat. After seven (7) days, remove with alcohol and continue cycle. 6.6 mL 2   Continuous Glucose Receiver (DEXCOM G7 RECEIVER) DEVI Use as directed. 1 each 0   Continuous Glucose Sensor (DEXCOM G7 SENSOR) MISC Use as directed to monitor blood glucose, changing sensor every 10 days. 3 each 3   dapagliflozin  propanediol (FARXIGA ) 10 MG TABS tablet Take 1 tablet (10 mg total) by mouth daily before breakfast. 90 tablet 3   folic acid  (FOLVITE ) 1 MG tablet Take 1 tablet (1 mg total) by mouth daily. 90 tablet 0   gabapentin  (NEURONTIN ) 300 MG capsule Take 2 capsules (600 mg total) by mouth 3 (three) times daily. 360 capsule 3   guaiFENesin  (MUCINEX ) 600 MG 12 hr tablet Take 1  tablet (600 mg total) by mouth 2 (two) times daily as needed to loosen phlegm or cough. 180 tablet 1   hydrOXYzine  (  ATARAX ) 25 MG tablet Take 1 tablet (25 mg total) by mouth at bedtime as needed. 30 tablet 2   icosapent  Ethyl (VASCEPA ) 1 g capsule Take 2 capsules (2 g total) by mouth 2 (two) times daily. 120 capsule 3   insulin  glargine (LANTUS  SOLOSTAR) 100 UNIT/ML Solostar Pen Inject 10 Units into the skin daily. 15 mL 1   Insulin  Pen Needle 32G X 4 MM MISC Use once daily to administer insulin  100 each 1   losartan  (COZAAR ) 25 MG tablet Take 1 tablet (25 mg total) by mouth daily. 90 tablet 3   metFORMIN  (GLUCOPHAGE ) 500 MG tablet Take 1 tablet (500 mg total) by mouth 2 (two) times daily with a meal. 180 tablet 0   metoprolol  succinate (TOPROL -XL) 25 MG 24 hr tablet Take 1 tablet (25 mg total) by mouth daily. 90 tablet 3   torsemide  40 MG TABS Take 40 mg by mouth daily.     triamcinolone  cream (KENALOG ) 0.5 % Apply topically to legs twice daily for up to 2 weeks as needed for eczema 30 g 1   No current facility-administered medications for this visit.    REVIEW OF SYSTEMS:   [X]  denotes positive finding, [ ]  denotes negative finding Cardiac  Comments:  Chest pain or chest pressure:    Shortness of breath upon exertion:    Short of breath when lying flat:    Irregular heart rhythm:        Vascular    Pain in calf, thigh, or hip brought on by ambulation:    Pain in feet at night that wakes you up from your sleep:     Blood clot in your veins:    Leg swelling:         Pulmonary    Oxygen at home:    Productive cough:     Wheezing:         Neurologic    Sudden weakness in arms or legs:     Sudden numbness in arms or legs:     Sudden onset of difficulty speaking or slurred speech:    Temporary loss of vision in one eye:     Problems with dizziness:         Gastrointestinal    Blood in stool:      Vomited blood:         Genitourinary    Burning when urinating:     Blood in  urine:        Psychiatric    Major depression:         Hematologic    Bleeding problems:    Problems with blood clotting too easily:        Skin    Rashes or ulcers:        Constitutional    Fever or chills:     PHYSICAL EXAM:   Vitals:   06/24/24 0849  BP: (!) 142/82  Pulse: 62  Resp: 20  Temp: 98.1 F (36.7 C)  TempSrc: Temporal  SpO2: 95%  Weight: 212 lb 11.2 oz (96.5 kg)  Height: 6' 1 (1.854 m)    GENERAL: The patient is a well-nourished male, in no acute distress. The vital signs are documented above. CARDIAC: There is a regular rate and rhythm.  VASCULAR: Palpable right dorsalis pedis pulse, nonpalpable left.  Hyperpigmentation bilaterally with trace edema PULMONARY: Nonlabored respirations ABDOMEN: Right lower quadrant ostomy with large ventral hernia MUSCULOSKELETAL: There are no major deformities or cyanosis. NEUROLOGIC: No focal  weakness or paresthesias are detected. SKIN: There are no ulcers or rashes noted. PSYCHIATRIC: The patient has a normal affect.  STUDIES:   I have reviewed the following:  +-------+-----------+-----------+------------+------------+  ABI/TBIToday's ABIToday's TBIPrevious ABIPrevious TBI  +-------+-----------+-----------+------------+------------+  Right 0.96       0.68                                 +-------+-----------+-----------+------------+------------+  Left  0.67       0.50                                 +-------+-----------+-----------+------------+------------+  Right toe pressure: 85 Left toe pressure: 62 Waveforms are multiphasic on the right and biphasic on the left  ASSESSMENT and PLAN   PAD: At this point, I do not feel that his leg complaints are related to his vascular insufficiency, rather this appears to be more related to diabetic neuropathy.  I have stressed the importance of continuing his Neurontin .  We also talked about smoking cessation and weight loss.  I have encouraged him to  attempt to wear 15-20 compression to help with his edema.  He knows to check his feet on a nightly basis and contact me should he develop any nonhealing wounds.  Otherwise he will follow-up in our office in 6 months with repeat ABIs.   Malvina Serene CLORE, MD, FACS Vascular and Vein Specialists of [facility] [phone] Pager [phone]      [1]  Allergies Allergen Reactions   Codeine Hives   Lisinopril Other (See Comments)    Other reaction(s): renal effects   "

## 2024-06-25 ENCOUNTER — Ambulatory Visit: Admitting: Podiatry

## 2024-06-25 DIAGNOSIS — E1142 Type 2 diabetes mellitus with diabetic polyneuropathy: Secondary | ICD-10-CM

## 2024-06-25 DIAGNOSIS — R2681 Unsteadiness on feet: Secondary | ICD-10-CM | POA: Diagnosis not present

## 2024-06-25 DIAGNOSIS — M79671 Pain in right foot: Secondary | ICD-10-CM

## 2024-06-25 DIAGNOSIS — M792 Neuralgia and neuritis, unspecified: Secondary | ICD-10-CM

## 2024-06-25 DIAGNOSIS — M79674 Pain in right toe(s): Secondary | ICD-10-CM | POA: Diagnosis not present

## 2024-06-25 DIAGNOSIS — M79675 Pain in left toe(s): Secondary | ICD-10-CM

## 2024-06-25 DIAGNOSIS — B351 Tinea unguium: Secondary | ICD-10-CM

## 2024-06-25 NOTE — Patient Instructions (Signed)
 Nerve Damage (Peripheral Neuropathy): What to Know Peripheral neuropathy happens when there's damage to the peripheral nerves. These nerves send signals between your spinal cord and your arms and legs. You can have damage in one or more nerves. What are the causes? There are many reasons why nerves can get damaged. Damage may be caused by some diseases, such as: Diabetes. This is the most common cause. Autoimmune diseases, such as rheumatoid arthritis or lupus. These happen when your body's defense system (immune system) attacks healthy parts of your body by mistake. Inherited nerve disease. This is passed down in families. Kidney disease. Thyroid disease. Other causes include: Pressure or stress on a nerve that lasts a long time. Lack of B vitamins from poor diet or drinking too much alcohol. Infections. Some medicines, like chemotherapy used for cancer treatment. Poisonous (toxic) substances, such as lead or mercury. Too little blood flowing to the legs. Sometimes, the cause isn't known. What are the signs or symptoms? Symptoms depend on which nerves are damaged. In your legs, hands, or arms, you may have: Loss of feeling (numbness). Tingling. Burning pain. Very sensitive skin. Weakness. Paralysis. This is when you can't move a part of your body. Clumsiness. Muscle twitching. Loss of balance. You may have trouble walking. Other symptoms can include: Not being able to control when you pee. Feeling dizzy. Problems during sex. How is this diagnosed? Your health care provider will: Ask about your symptoms and medical history. Do a physical exam. Do a neurological exam. They will check your reflexes, how you move, and what you can feel. You may need to have other tests, such as: Blood tests. Nerve tests to check how well your nerves and muscles work. Imaging tests, such as a CT scan or MRI. These are done to rule out other causes. Nerve biopsy. This is when a small piece of a  nerve is removed for testing. Lumbar puncture. A small amount of the fluid that surrounds the brain and spinal cord is taken and checked in a lab. How is this treated? Treatment may include: Treating the cause of the nerve damage. Pain medicines. Other medicines that may help with pain, such as: Medicines that treat seizures. Medicines that treat depression. Pain patches or creams that are put on painful areas of skin. TENS therapy. This uses a device that sends mild electrical pulses to your nerves. This will prevent pain signals from reaching the brain. Massage. Acupuncture. Surgery to relieve pressure on a nerve or to destroy a nerve that's causing pain. This is done only if the other treatments are not helping. You may also need physical therapy to help improve your movement, balance, and muscle strength. You may be told to use a cane or walker if needed. Follow these instructions at home: Medicines Take your medicines only as told. You may need to take steps to help treat or prevent trouble pooping (constipation), such as: Taking medicines to help you poop. Eating foods high in fiber, like beans, whole grains, and fresh fruits and vegetables. Drinking more fluids as told. Ask your provider if it's safe to drive or use machines while taking your medicine. Lifestyle  Do not smoke, vape, or use nicotine or tobacco. Avoid or limit alcohol. Too much alcohol can be harmful to nerves and cause damage. Eat a healthy diet. Safety  Take care to avoid burning or damaging your skin if you have numbness. If you have numbness in your feet: Check your feet each day for signs of injury  or infection. Watch for redness, warmth, and swelling. Wear padded socks and comfortable shoes. These help protect your feet. General instructions If you have diabetes, keep your blood sugar under control. Develop a good support system. Living with nerve damage can cause a lot of stress. Talk with a mental  health therapist or join a support group. Exercise as told. Ask what things are safe for you to do at home. Work with a pain specialist to find the best way to manage your pain. Keep all follow-up visits. Your provider will check if the treatments are working and change them if needed. Where to find support The Foundation for Peripheral Neuropathy: BudgetManiac.si Where to find more information To learn more, go to: [employer] of Neurological Disorders and Stroke at BasicFM.no. Click Search and type peripheral neuropathy. Find the link you need. Contact a health care provider if: Your pain treatments are not working. Your symptoms are getting worse. You have side effects from any of your medicines. You have new symptoms. You're having a hard time coping with your condition. Get help right away if: You have a wound that's not healing. You have new weakness in an arm or leg. You've fallen or you fall often. This information is not intended to replace advice given to you by your health care provider. Make sure you discuss any questions you have with your health care provider. Document Revised: 08/24/2023 Document Reviewed: 08/24/2023 Elsevier Patient Education  2025 [employer].

## 2024-06-25 NOTE — Progress Notes (Signed)
 Subjective: Chief Complaint  Patient presents with   [facility]    IDDM Patient with an A1c of 8.7 presents today for a [facility] and nail trim reports tingling and numbness due to neuropathy diagnoses     64 year old male presents the office today with multiple complaints.  He states he has ongoing numbness to his feet he is currently taking gabapentin  for this.  He did see vascular about any vascular causes.  We tried for [employer]  approval however this was denied and a referral to pain management was placed but has not yet seen them.   Also asked for the nails be trimmed as her thick elongated and they have difficulty trimming them.  No open lesions.   No recent injury or changes.    Objective: AAO x3, NAD DP/PT pulses palpable bilaterally, CRT less than 3 seconds Protective sensation decreased with Semmes Weinstein monofilament Chronic appearing edema present to bilateral lower extremities. 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  There is mild tenderness palpation of this area.  Not able to appreciate any area of pinpoint tenderness. Nails are hypertrophic, dystrophic, brittle, discolored, elongated 10. No surrounding redness or drainage. Tenderness nails 1-5 bilaterally. No open lesions or pre-ulcerative lesions are identified today. No pain with calf compression, warmth, erythema  Assessment: 64 year old male with chronic bilateral lower extremity neuropathic pain; history of gout; symptomatic onychosis  Plan: Symptomatic onychomycosis -Debrided nails x 10 without complications or bleeding.  Neuropathy/chronic foot pain -He is already on gabapentin .  Can continue this.  I will also refer him to [facility] pain agement given ongoing neuropathic pain.  -Given gait instability referred to physical therapy. -I ordered ABI to rule out any circulatory issues-saw vascular yesterday. -Continue allopurinol  for gout.  No current signs of gout  noted clinically.   No follow-ups on file.  Donnice JONELLE Fees DPM

## 2024-06-27 ENCOUNTER — Other Ambulatory Visit (HOSPITAL_COMMUNITY): Payer: Self-pay

## 2024-06-27 ENCOUNTER — Telehealth: Payer: Self-pay

## 2024-06-27 ENCOUNTER — Other Ambulatory Visit: Payer: Self-pay

## 2024-06-27 ENCOUNTER — Telehealth (HOSPITAL_COMMUNITY): Payer: Self-pay

## 2024-06-27 ENCOUNTER — Other Ambulatory Visit: Payer: Self-pay | Admitting: Nurse Practitioner

## 2024-06-27 DIAGNOSIS — E1142 Type 2 diabetes mellitus with diabetic polyneuropathy: Secondary | ICD-10-CM

## 2024-06-27 MED ORDER — TRIAMCINOLONE ACETONIDE 0.5 % EX CREA
TOPICAL_CREAM | CUTANEOUS | 1 refills | Status: AC
  Filled 2024-06-27: qty 30, 30d supply, fill #0

## 2024-06-27 MED ORDER — DEXCOM G7 SENSOR MISC
1.0000 | 3 refills | Status: AC
  Filled 2024-06-27 – 2024-07-13 (×2): qty 3, 30d supply, fill #0

## 2024-06-27 NOTE — Telephone Encounter (Signed)
-----   Message from Donnice Fees, DPM sent at 06/25/2024  3:51 PM EST ----- Can we follow up on the [facility] referral?

## 2024-06-27 NOTE — Telephone Encounter (Signed)
 Called to confirm appt. Pt confirmed appt. Instructed pt on proper footwear. Gave directions along with department number.

## 2024-06-27 NOTE — Telephone Encounter (Signed)
 Appeal has been approved by the insurance, the full letter can be found under the media tab.    Thank you, Devere Pandy, PharmD Clinical Pharmacist  [facility]  Direct Dial: [phone]

## 2024-06-27 NOTE — Telephone Encounter (Signed)
 [facility] Physical Medicine (pain clinic). Left a message for the referral coordinator to please call us  back with an update on the status of this referral. Thanks [phone]

## 2024-06-28 ENCOUNTER — Other Ambulatory Visit: Payer: Self-pay

## 2024-06-28 ENCOUNTER — Encounter (HOSPITAL_COMMUNITY): Payer: Self-pay

## 2024-06-28 ENCOUNTER — Other Ambulatory Visit (HOSPITAL_COMMUNITY): Payer: Self-pay

## 2024-06-28 ENCOUNTER — Encounter (HOSPITAL_COMMUNITY)
Admission: RE | Admit: 2024-06-28 | Discharge: 2024-06-28 | Disposition: A | Discharge status: Home / Self Care | Source: Ambulatory Visit | Attending: Internal Medicine | Admitting: Internal Medicine

## 2024-06-28 VITALS — BP 122/66 | HR 60 | Ht 73.0 in | Wt 211.6 lb

## 2024-06-28 DIAGNOSIS — J449 Chronic obstructive pulmonary disease, unspecified: Secondary | ICD-10-CM | POA: Insufficient documentation

## 2024-06-28 NOTE — Progress Notes (Signed)
 Luis [name] 64 y.o. male  Initial Psychosocial Assessment  Pt psychosocial assessment reveals pt lives with their son. Pt is currently unemployed, disabled. Pt hobbies include watching tv. Pt reports his  stress level is moderate. Areas of stress/anxiety include health and finances.  Pt does possibly exhibit signs of depression. PHQ 2/9 is 5/16. Signs of depression include guilt, helplessness, and hopelessness and fatigue. Pt shows fair  coping skills with neutral outlook . Offered emotional support and reassurance. Monitor and evaluate progress toward psychosocial goal(s).  Goal(s): Improved management of stress Improved coping skills Help patient work toward returning to meaningful activities that improve patient's QOL and are attainable with patient's lung disease See a therapist for counseling and possible medication management   06/28/2024 10:35 AM

## 2024-06-28 NOTE — Telephone Encounter (Signed)
 Left detailed message on machine to contact pain clinic.

## 2024-06-28 NOTE — Progress Notes (Signed)
 Luis [name] 64 y.o. male Pulmonary Rehab Orientation Note This patient who was referred to Pulmonary Rehab by Dr. Glade Hope with the diagnosis of COPD 2 arrived today in Cardiac and Pulmonary Rehab. He arrived ambulatory with normal gait. He does not carry portable oxygen. Per patient, Luis [name] uses oxygen never. Color good, skin warm and dry. Patient is oriented to time and place. Patient's medical history, psychosocial health, and medications reviewed. Psychosocial assessment reveals patient lives with family. Luis [name] is currently unemployed, disabled. Patient hobbies include watching tv. Patient reports his stress level is moderate. Areas of stress/anxiety include health and finances. Patient does possibly exhibit signs of depression. Signs of depression include guilt, helplessness, and hopelessness and fatigue. PHQ2/9 score 5/16. Luis [name] shows fair  coping skills with neutral outlook on life. Offered emotional support and reassurance. Will continue to monitor and evaluate progress toward psychosocial goal(s) of decreased health and financial stress. Physical assessment reveals heart rate is normal, breath sounds diminished to auscultation, no wheezes, rales, or rhonchi. Grip strength equal, strong. Distal pulses present. Luis [name] reports he  does take medications as prescribed. Patient states he  follows a regular  diet. The patient has been trying to lose weight through a healthy diet and exercise program.. Pt's weight will be monitored closely. Demonstration and practice of PLB using pulse oximeter. Luis [name] able to return demonstration satisfactorily. Safety and hand hygiene in the exercise area reviewed with patient. Luis [name] voices understanding of the information reviewed. Department expectations discussed with patient and achievable goals were set. The patient shows enthusiasm about attending the program and we look forward to working with Luis. Luis [name] completed a 6 min walk test today and is scheduled to begin exercise on  1/22 at 1315.   [id number] Luis [name]

## 2024-06-28 NOTE — Progress Notes (Signed)
" °  PSYCHOSOCIAL ASSESSMENT   Patient psychosocial assessment reveals patient shows fair coping skills with neutral outlook.  Financial and health related stress are barriers to rehab participation identified. Offered emotional support and reassurance. Agreeable for ambulatory referral to [facility] and verbalizes understanding that this is a separate cost from Cardiac/Pulmonary rehab and there may be a financial obligation depending upon patient's insurance plan coverage.  PHQ9 -16  Pt may possibly be depressed about his decline in health which happened in the last 5 years. Pt has a colostomy, multiple adhesive and skin breakdown issues, open non-healing wound to mid abdomen, COPD exacerbations and hospitalizations. He wants to quit smoking but does not have a plan or quit date yet. Pt states all he does is sit on his couch and watch TV. Congratulated pt on weight loss of ~40#. Pt stated he has enjoyed his son living with him but he is getting ready to move to [facility] for a couple months for work. Pt states if he needs anything his son will help out. Pt states he lives on a fixed budget and inflation has caused him to stress about his finances. Pt's PHQ 2/9 scores 5/16. Offered referral to behavioral health for therapy and possible med management and pt agreeable to plan.    "

## 2024-06-28 NOTE — Progress Notes (Signed)
 Ambulatory referral to Behavioral Health placed per request - Ronal Levin RN. Saturnino Bernett PEAK, BSN Cardiac and Emergency Planning/management Officer

## 2024-06-28 NOTE — Progress Notes (Addendum)
 Pulmonary Individual Treatment Plan  Patient Details  Name: Luis [name] MRN: [id number] Date of Birth: 11-22-1960 Referring Provider:   Flowsheet Row PULMONARY REHAB COPD ORIENTATION from 06/28/2024 in [facility]  Referring Provider Dr. Kassie    Initial Encounter Date:  Flowsheet Row PULMONARY REHAB COPD ORIENTATION from 06/28/2024 in [facility] for Heart, Vascular, & Lung Health  Date 06/28/24    Visit Diagnosis: Stage 2 moderate COPD by GOLD classification [facility])  Patient's Home Medications on Admission:  Current Medications[1]  Past Medical History: Past Medical History:  Diagnosis Date   Acute respiratory failure with hypoxia (HCC) 12/23/2022   Alcohol withdrawal syndrome, with delirium (HCC) 12/27/2022   COPD (chronic obstructive pulmonary disease) (HCC)    Diabetes mellitus without complication (HCC)    Diverticulitis    DVT (deep venous thrombosis) (HCC)    x3   Encephalopathy acute 12/28/2022   ETOH abuse    H/O colectomy    Hyperlipidemia    Hypertension    Sepsis associated hypotension (HCC) 08/03/2019   Smoker     Tobacco Use: Tobacco Use History[2]  Labs: Review Flowsheet  More data exists      Latest Ref Rng & Units 08/06/2023 12/13/2023 03/08/2024 04/09/2024 05/31/2024  Labs for ITP Cardiac and Pulmonary Rehab  Cholestrol 28 - 200 mg/dL - - - 843  885   LDL (calc) 10 - 99 mg/dL - - - - 23   Direct LDL mg/dL - - - 72.9  -  HDL-C >60.99 mg/dL - - - 77.69  75.99   Trlycerides 10.0 - 149.0 mg/dL - - - 157.9 Triglyceride is over 400; calculations on Lipids are invalid.  334.0   Hemoglobin A1c 4.6 - 6.5 % 7.3  - 8.4  11.1  8.7   PH, Arterial 7.35 - 7.45 - 7.28  7.26  - - -  PCO2 arterial 32 - 48 mmHg - 72  68  - - -  Bicarbonate 20.0 - 28.0 mmol/L - 33.4  30.5  - - -  O2 Saturation % - 99.4  91.7  - - -    Details       Multiple values from one day are sorted in  reverse-chronological order         Capillary Blood Glucose: Lab Results  Component Value Date   GLUCAP 165 (H) 06/06/2024   GLUCAP 132 (H) 06/06/2024   GLUCAP 114 (H) 06/06/2024   GLUCAP 137 (H) 06/06/2024   GLUCAP 132 (H) 06/05/2024     Pulmonary Assessment Scores:  Pulmonary Assessment Scores     Row Name 06/28/24 0927         ADL UCSD   SOB Score total 36       CAT Score   CAT Score 21       mMRC Score   mMRC Score 4       UCSD: Self-administered rating of dyspnea associated with activities of daily living (ADLs) 6-point scale (0 = not at all to 5 = maximal or unable to do because of breathlessness)  Scoring Scores range from 0 to 120.  Minimally important difference is 5 units  CAT: CAT can identify the health impairment of COPD patients and is better correlated with disease progression.  CAT has a scoring range of zero to 40. The CAT score is classified into four groups of low (less than 10), medium (10 - 20), high (21-30) and very  high (31-40) based on the impact level of disease on health status. A CAT score over 10 suggests significant symptoms.  A worsening CAT score could be explained by an exacerbation, poor medication adherence, poor inhaler technique, or progression of COPD or comorbid conditions.  CAT MCID is 2 points  mMRC: mMRC (Modified Medical Research Council) Dyspnea Scale is used to assess the degree of baseline functional disability in patients of respiratory disease due to dyspnea. No minimal important difference is established. A decrease in score of 1 point or greater is considered a positive change.   Pulmonary Function Assessment:  Pulmonary Function Assessment - 06/28/24 0927       Breath   Bilateral Breath Sounds Decreased    Shortness of Breath Yes;Fear of Shortness of Breath;Limiting activity          Exercise Target Goals: Exercise Program Goal: Individual exercise prescription set using results from initial 6 min walk  test and THRR while considering  patients activity barriers and safety.   Exercise Prescription Goal: Initial exercise prescription builds to 30-45 minutes a day of aerobic activity, 2-3 days per week.  Home exercise guidelines will be given to patient during program as part of exercise prescription that the participant will acknowledge.  Activity Barriers & Risk Stratification:  Activity Barriers & Cardiac Risk Stratification - 06/28/24 0925       Activity Barriers & Cardiac Risk Stratification   Activity Barriers Deconditioning;Muscular Weakness;Shortness of Breath;Back Problems;Balance Concerns;Other (comment)    Comments BLL neuropathy          6 Minute Walk:  6 Minute Walk     Row Name 06/28/24 1024         6 Minute Walk   Phase Initial     Distance 840 feet     Walk Time 6 minutes     # of Rest Breaks 0     MPH 1.59     METS 2.28     RPE 10     Perceived Dyspnea  1     VO2 Peak 7.99     Symptoms No     Resting HR 60 bpm     Resting BP 122/66     Resting Oxygen Saturation  94 %     Exercise Oxygen Saturation  during 6 min walk 90 %     Max Ex. HR 78 bpm     Max Ex. BP 132/64     2 Minute Post BP 124/66       Interval HR   1 Minute HR 70     2 Minute HR 71     3 Minute HR 75     4 Minute HR 78     5 Minute HR 76     6 Minute HR 78     2 Minute Post HR 63     Interval Heart Rate? Yes       Interval Oxygen   Interval Oxygen? Yes     Baseline Oxygen Saturation % 94 %     1 Minute Oxygen Saturation % 94 %     1 Minute Liters of Oxygen 0 L     2 Minute Oxygen Saturation % 92 %     2 Minute Liters of Oxygen 0 L     3 Minute Oxygen Saturation % 92 %     3 Minute Liters of Oxygen 0 L     4 Minute Oxygen Saturation % 90 %  4 Minute Liters of Oxygen 0 L     5 Minute Oxygen Saturation % 93 %     5 Minute Liters of Oxygen 0 L     6 Minute Oxygen Saturation % 93 %     6 Minute Liters of Oxygen 0 L     2 Minute Post Oxygen Saturation % 94 %     2 Minute  Post Liters of Oxygen 0 L        Oxygen Initial Assessment:  Oxygen Initial Assessment - 06/28/24 0927       Home Oxygen   Home Oxygen Device None    Sleep Oxygen Prescription None    Home Exercise Oxygen Prescription None    Home Resting Oxygen Prescription None      Initial 6 min Walk   Oxygen Used None      Program Oxygen Prescription   Program Oxygen Prescription None      Intervention   Short Term Goals To learn and understand importance of maintaining oxygen saturations>88%;To learn and understand importance of monitoring SPO2 with pulse oximeter and demonstrate accurate use of the pulse oximeter.;To learn and demonstrate proper pursed lip breathing techniques or other breathing techniques.     Long  Term Goals Exhibits proper breathing techniques, such as pursed lip breathing or other method taught during program session;Verbalizes importance of monitoring SPO2 with pulse oximeter and return demonstration;Compliance with respiratory medication;Maintenance of O2 saturations>88%          Oxygen Re-Evaluation:  Oxygen Re-Evaluation     Row Name 06/28/24 1125             Program Oxygen Prescription   Program Oxygen Prescription None         Home Oxygen   Home Oxygen Device None       Sleep Oxygen Prescription None       Home Exercise Oxygen Prescription None       Home Resting Oxygen Prescription None         Goals/Expected Outcomes   Short Term Goals To learn and understand importance of maintaining oxygen saturations>88%;To learn and understand importance of monitoring SPO2 with pulse oximeter and demonstrate accurate use of the pulse oximeter.;To learn and demonstrate proper pursed lip breathing techniques or other breathing techniques.        Long  Term Goals Exhibits proper breathing techniques, such as pursed lip breathing or other method taught during program session;Verbalizes importance of monitoring SPO2 with pulse oximeter and return  demonstration;Compliance with respiratory medication;Maintenance of O2 saturations>88%       Goals/Expected Outcomes Compliance and understanding of oxygen saturation monitoring and breathing techniques to decrease shortness of breath.          Oxygen Discharge (Final Oxygen Re-Evaluation):  Oxygen Re-Evaluation - 06/28/24 1125       Program Oxygen Prescription   Program Oxygen Prescription None      Home Oxygen   Home Oxygen Device None    Sleep Oxygen Prescription None    Home Exercise Oxygen Prescription None    Home Resting Oxygen Prescription None      Goals/Expected Outcomes   Short Term Goals To learn and understand importance of maintaining oxygen saturations>88%;To learn and understand importance of monitoring SPO2 with pulse oximeter and demonstrate accurate use of the pulse oximeter.;To learn and demonstrate proper pursed lip breathing techniques or other breathing techniques.     Long  Term Goals Exhibits proper breathing techniques, such as pursed lip breathing or  other method taught during program session;Verbalizes importance of monitoring SPO2 with pulse oximeter and return demonstration;Compliance with respiratory medication;Maintenance of O2 saturations>88%    Goals/Expected Outcomes Compliance and understanding of oxygen saturation monitoring and breathing techniques to decrease shortness of breath.          Initial Exercise Prescription:  Initial Exercise Prescription - 06/28/24 1000       Date of Initial Exercise RX and Referring Provider   Date 06/28/24    Referring Provider Dr. Kassie    Expected Discharge Date 08/20/24      Arm Ergometer   Level 1    Minutes 15    METs 1.8      Track   Laps 7   215ft track   Minutes 15    METs 2.08      Prescription Details   Frequency (times per week) 2    Duration Progress to 30 minutes of continuous aerobic without signs/symptoms of physical distress      Intensity   THRR 40-80% of Max Heartrate 62-125     Ratings of Perceived Exertion 11-13    Perceived Dyspnea 0-4      Progression   Progression Continue to progress workloads to maintain intensity without signs/symptoms of physical distress.      Resistance Training   Training Prescription Yes    Weight blue bands    Reps 10-15          Perform Capillary Blood Glucose checks as needed.  Exercise Prescription Changes:   Exercise Comments:   Exercise Goals and Review:   Exercise Goals     Row Name 06/28/24 0926             Exercise Goals   Increase Physical Activity Yes       Intervention Provide advice, education, support and counseling about physical activity/exercise needs.;Develop an individualized exercise prescription for aerobic and resistive training based on initial evaluation findings, risk stratification, comorbidities and participant's personal goals.       Expected Outcomes Short Term: Attend rehab on a regular basis to increase amount of physical activity.;Long Term: Exercising regularly at least 3-5 days a week.;Long Term: Add in home exercise to make exercise part of routine and to increase amount of physical activity.       Increase Strength and Stamina Yes       Intervention Provide advice, education, support and counseling about physical activity/exercise needs.;Develop an individualized exercise prescription for aerobic and resistive training based on initial evaluation findings, risk stratification, comorbidities and participant's personal goals.       Expected Outcomes Short Term: Increase workloads from initial exercise prescription for resistance, speed, and METs.;Short Term: Perform resistance training exercises routinely during rehab and add in resistance training at home;Long Term: Improve cardiorespiratory fitness, muscular endurance and strength as measured by increased METs and functional capacity ( )       Able to understand and use rate of perceived exertion (RPE) scale Yes        Intervention Provide education and explanation on how to use RPE scale       Expected Outcomes Short Term: Able to use RPE daily in rehab to express subjective intensity level;Long Term:  Able to use RPE to guide intensity level when exercising independently       Able to understand and use Dyspnea scale Yes       Intervention Provide education and explanation on how to use Dyspnea scale       Expected  Outcomes Short Term: Able to use Dyspnea scale daily in rehab to express subjective sense of shortness of breath during exertion;Long Term: Able to use Dyspnea scale to guide intensity level when exercising independently       Knowledge and understanding of Target Heart Rate Range (THRR) Yes       Intervention Provide education and explanation of THRR including how the numbers were predicted and where they are located for reference       Expected Outcomes Short Term: Able to state/look up THRR;Long Term: Able to use THRR to govern intensity when exercising independently;Short Term: Able to use daily as guideline for intensity in rehab       Understanding of Exercise Prescription Yes       Intervention Provide education, explanation, and written materials on patient's individual exercise prescription       Expected Outcomes Short Term: Able to explain program exercise prescription;Long Term: Able to explain home exercise prescription to exercise independently          Exercise Goals Re-Evaluation :  Exercise Goals Re-Evaluation     Row Name 06/28/24 1124             Exercise Goal Re-Evaluation   Exercise Goals Review Increase Physical Activity;Able to understand and use Dyspnea scale;Understanding of Exercise Prescription;Increase Strength and Stamina;Knowledge and understanding of Target Heart Rate Range (THRR);Able to understand and use rate of perceived exertion (RPE) scale       Comments Pt will begin exercise 1/22, walking the track with the [employer] and using the arm ergometer. Will  progress as tolerated.       Expected Outcomes Through exercise at rehab and home, the patient will decrease shortness of breath with daily activities and feel confident in carrying out an exercise regimen at home.          Discharge Exercise Prescription (Final Exercise Prescription Changes):   Nutrition:  Target Goals: Understanding of nutrition guidelines, daily intake of sodium 1500mg , cholesterol 200mg , calories 30% from fat and 7% or less from saturated fats, daily to have 5 or more servings of fruits and vegetables.  Biometrics:  Pre Biometrics - 06/28/24 1031       Pre Biometrics   Grip Strength 20 kg           Nutrition Therapy Plan and Nutrition Goals:   Nutrition Assessments:  MEDIFICTS Score Key: >=70 Need to make dietary changes  40-70 Heart Healthy Diet <= 40 Therapeutic Level Cholesterol Diet   Picture Your Plate Scores: <59 Unhealthy dietary pattern with much room for improvement. 41-50 Dietary pattern unlikely to meet recommendations for good health and room for improvement. 51-60 More healthful dietary pattern, with some room for improvement.  >60 Healthy dietary pattern, although there may be some specific behaviors that could be improved.    Nutrition Goals Re-Evaluation:   Nutrition Goals Discharge (Final Nutrition Goals Re-Evaluation):   Psychosocial: Target Goals: Acknowledge presence or absence of significant depression and/or stress, maximize coping skills, provide positive support system. Participant is able to verbalize types and ability to use techniques and skills needed for reducing stress and depression.  Initial Review & Psychosocial Screening:  Initial Psych Review & Screening - 06/28/24 0928       Initial Review   Current issues with Current Stress Concerns    Source of Stress Concerns Chronic Illness;Unable to perform yard/household activities;Unable to participate in former interests or hobbies;Financial    Comments Pt  stated his son moved in with  him to help take care of him but travles a lot for work and is getting ready to move to [place] for a couple of months. States he is stressed about his health and his finances. Discussed referral to therapist and agrees.   placed referral to therapist     Family Dynamics   Good Support System? Yes    Comments Good support from son      Barriers   Psychosocial barriers to participate in program The patient should benefit from training in stress management and relaxation.;Psychosocial barriers identified (see note)      Screening Interventions   Interventions Encouraged to exercise;Provide feedback about the scores to participant;To provide support and resources with identified psychosocial needs    Expected Outcomes Short Term goal: Utilizing psychosocial counselor, staff and physician to assist with identification of specific Stressors or current issues interfering with healing process. Setting desired goal for each stressor or current issue identified.;Long Term Goal: Stressors or current issues are controlled or eliminated.;Short Term goal: Identification and review with participant of any Quality of Life or Depression concerns found by scoring the questionnaire.;Long Term goal: The participant improves quality of Life and PHQ9 Scores as seen by post scores and/or verbalization of changes          Quality of Life Scores:  Scores of 19 and below usually indicate a poorer quality of life in these areas.  A difference of  2-3 points is a clinically meaningful difference.  A difference of 2-3 points in the total score of the Quality of Life Index has been associated with significant improvement in overall quality of life, self-image, physical symptoms, and general health in studies assessing change in quality of life.  PHQ-9: Review Flowsheet  More data exists      06/28/2024 10/25/2023 10/20/2023 10/12/2023 06/15/2023  Depression screen PHQ 2/9  Decreased Interest 3 1 0 0  0  Down, Depressed, Hopeless 2 1 - 3 0  PHQ - 2 Score 5 2 0 3 0  Altered sleeping 2 1 - 2 -  Tired, decreased energy 2 1 - 2 -  Change in appetite 0 0 - 0 -  Feeling bad or failure about yourself  2 1 - 1 -  Trouble concentrating 2 1 - 1 -  Moving slowly or fidgety/restless 2 0 - 0 -  Suicidal thoughts 1 0 - 0 -  PHQ-9 Score 16 6  - 9  -  Difficult doing work/chores Somewhat difficult Somewhat difficult - Very difficult -    Details       Data saved with a previous flowsheet row definition        Interpretation of Total Score  Total Score Depression Severity:  1-4 = Minimal depression, 5-9 = Mild depression, 10-14 = Moderate depression, 15-19 = Moderately severe depression, 20-27 = Severe depression   Psychosocial Evaluation and Intervention:  Psychosocial Evaluation - 06/28/24 0929       Psychosocial Evaluation & Interventions   Interventions Stress management education;Encouraged to exercise with the program and follow exercise prescription    Comments Pt stated his son moved in with him to help take care of him but travles a lot for work and is getting ready to move to [facility] for a couple of months. States he is stressed about his health and his finances. Discussed referral to therapist and agrees.    Expected Outcomes For Zylan to attend pulmonary rehab and to have a positive outlook and good coping skills to  manage his possible depression and stress.    Continue Psychosocial Services  Follow up required by counselor          Psychosocial Re-Evaluation:   Psychosocial Discharge (Final Psychosocial Re-Evaluation):   Education: Education Goals: Education classes will be provided on a weekly basis, covering required topics. Participant will state understanding/return demonstration of topics presented.  Learning Barriers/Preferences:  Learning Barriers/Preferences - 06/28/24 0930       Learning Barriers/Preferences   Learning Barriers Sight;Exercise Concerns     Learning Preferences None          Education Topics: Know Your Numbers Group instruction that is supported by a PowerPoint presentation. Instructor discusses importance of knowing and understanding resting, exercise, and post-exercise oxygen saturation, heart rate, and blood pressure. Oxygen saturation, heart rate, blood pressure, rating of perceived exertion, and dyspnea are reviewed along with a normal range for these values.    Exercise for the Pulmonary Patient Group instruction that is supported by a PowerPoint presentation. Instructor discusses benefits of exercise, core components of exercise, frequency, duration, and intensity of an exercise routine, importance of utilizing pulse oximetry during exercise, safety while exercising, and options of places to exercise outside of rehab.    MET Level  Group instruction provided by PowerPoint, verbal discussion, and written material to support subject matter. Instructor reviews what METs are and how to increase METs.    Pulmonary Medications Verbally interactive group education provided by instructor with focus on inhaled medications and proper administration.   Anatomy and Physiology of the Respiratory System Group instruction provided by PowerPoint, verbal discussion, and written material to support subject matter. Instructor reviews respiratory cycle and anatomical components of the respiratory system and their functions. Instructor also reviews differences in obstructive and restrictive respiratory diseases with examples of each.  Flowsheet Row PULMONARY REHAB CHRONIC OBSTRUCTIVE PULMONARY DISEASE from 11/16/2023 in [facility]  Date 11/16/23  Educator RT  Instruction Review Code 1- Verbalizes Understanding    Oxygen Safety Group instruction provided by PowerPoint, verbal discussion, and written material to support subject matter. There is an overview of What is Oxygen and  Why do we need it.  Instructor also reviews how to create a safe environment for oxygen use, the importance of using oxygen as prescribed, and the risks of noncompliance. There is a brief discussion on traveling with oxygen and resources the patient may utilize.   Oxygen Use Group instruction provided by PowerPoint, verbal discussion, and written material to discuss how supplemental oxygen is prescribed and different types of oxygen supply systems. Resources for more information are provided.    Breathing Techniques Group instruction that is supported by demonstration and informational handouts. Instructor discusses the benefits of pursed lip and diaphragmatic breathing and detailed demonstration on how to perform both.     Risk Factor Reduction Group instruction that is supported by a PowerPoint presentation. Instructor discusses the definition of a risk factor, different risk factors for pulmonary disease, and how the heart and lungs work together.   Pulmonary Diseases Group instruction provided by PowerPoint, verbal discussion, and written material to support subject matter. Instructor gives an overview of the different type of pulmonary diseases. There is also a discussion on risk factors and symptoms as well as ways to manage the diseases.   Stress and Energy Conservation Group instruction provided by PowerPoint, verbal discussion, and written material to support subject matter. Instructor gives an overview of stress and the impact it  can have on the body. Instructor also reviews ways to reduce stress. There is also a discussion on energy conservation and ways to conserve energy throughout the day.   Warning Signs and Symptoms Group instruction provided by PowerPoint, verbal discussion, and written material to support subject matter. Instructor reviews warning signs and symptoms of stroke, heart attack, cold and flu. Instructor also reviews ways to prevent the spread of  infection.   Other Education Group or individual verbal, written, or video instructions that support the educational goals of the pulmonary rehab program.    Knowledge Questionnaire Score:  Knowledge Questionnaire Score - 06/28/24 0933       Knowledge Questionnaire Score   Pre Score 14/18          Core Components/Risk Factors/Patient Goals at Admission:  Personal Goals and Risk Factors at Admission - 06/28/24 0930       Core Components/Risk Factors/Patient Goals on Admission    Weight Management Yes;Obesity;Weight Loss    Intervention Weight Management: Develop a combined nutrition and exercise program designed to reach desired caloric intake, while maintaining appropriate intake of nutrient and fiber, sodium and fats, and appropriate energy expenditure required for the weight goal.;Weight Management: Provide education and appropriate resources to help participant work on and attain dietary goals.;Weight Management/Obesity: Establish reasonable short term and long term weight goals.;Obesity: Provide education and appropriate resources to help participant work on and attain dietary goals.    Expected Outcomes Short Term: Continue to assess and modify interventions until short term weight is achieved;Long Term: Adherence to nutrition and physical activity/exercise program aimed toward attainment of established weight goal;Weight Loss: Understanding of general recommendations for a balanced deficit meal plan, which promotes 1-2 lb weight loss per week and includes a negative energy balance of [phone] kcal/d;Understanding recommendations for meals to include 15-35% energy as protein, 25-35% energy from fat, 35-60% energy from carbohydrates, less than 200mg  of dietary cholesterol, 20-35 gm of total fiber daily;Understanding of distribution of calorie intake throughout the day with the consumption of 4-5 meals/snacks    Tobacco Cessation Yes    Number of packs per day 1/2    Intervention  Assist the participant in steps to quit. Provide individualized education and counseling about committing to Tobacco Cessation, relapse prevention, and pharmacological support that can be provided by physician.;Education officer, environmental, assist with locating and accessing local/national Quit Smoking programs, and support quit date choice.    Expected Outcomes Short Term: Will demonstrate readiness to quit, by selecting a quit date.;Long Term: Complete abstinence from all tobacco products for at least 12 months from quit date.;Short Term: Will quit all tobacco product use, adhering to prevention of relapse plan.    Improve shortness of breath with ADL's Yes    Intervention Provide education, individualized exercise plan and daily activity instruction to help decrease symptoms of SOB with activities of daily living.    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       [1]  Current Outpatient Medications:    acetaminophen  (TYLENOL ) 500 MG tablet, Take 500 mg by mouth every 8 (eight) hours  as needed for moderate pain (pain score 4-6)., Disp: , Rfl:    allopurinol  (ZYLOPRIM ) 100 MG tablet, Take 1 tablet (100 mg total) by mouth daily., Disp: 90 tablet, Rfl: 3   ascorbic acid  (VITAMIN C ) 500 MG tablet, Take 1 tablet (500 mg total) by mouth daily., Disp: 30 tablet, Rfl: 1   atorvastatin  (LIPITOR) 10 MG tablet, Take 1 tablet (10 mg total) by mouth daily., Disp: 90 tablet, Rfl: 3   cetirizine  (ZYRTEC ) 5 MG tablet, Take 1 tablet (5 mg total) by mouth daily., Disp: 30 tablet, Rfl: 2    Continuous Glucose Receiver (DEXCOM G7 RECEIVER) DEVI, Use as directed., Disp: 1 each, Rfl: 0   Continuous Glucose Sensor (DEXCOM G7 SENSOR) MISC, Use as directed to monitor blood glucose, changing sensor every 10 days., Disp: 3 each, Rfl: 3   dapagliflozin  propanediol (FARXIGA ) 10 MG TABS tablet, Take 1 tablet (10 mg total) by mouth daily before breakfast., Disp: 90 tablet, Rfl: 3   folic acid  (FOLVITE ) 1 MG tablet, Take 1 tablet (1 mg total) by mouth daily., Disp: 90 tablet, Rfl: 0   gabapentin  (NEURONTIN ) 300 MG capsule, Take 2 capsules (600 mg total) by mouth 3 (three) times daily., Disp: 360 capsule, Rfl: 3   guaiFENesin  (MUCINEX ) 600 MG 12 hr tablet, Take 1 tablet (600 mg total) by mouth 2 (two) times daily as needed to loosen phlegm or cough., Disp: 180 tablet, Rfl: 1   hydrOXYzine  (ATARAX ) 25 MG tablet, Take 1 tablet (25 mg total) by mouth at bedtime as needed., Disp: 30 tablet, Rfl: 2   icosapent  Ethyl (VASCEPA ) 1 g capsule, Take 2 capsules (2 g total) by mouth 2 (two) times daily., Disp: 120 capsule, Rfl: 3   insulin  glargine (LANTUS  SOLOSTAR) 100 UNIT/ML Solostar Pen, Inject 10 Units into the skin daily., Disp: 15 mL, Rfl: 1   Insulin  Pen Needle 32G X 4 MM MISC, Use once daily to administer insulin , Disp: 100 each, Rfl: 1   losartan  (COZAAR ) 25 MG tablet, Take 1 tablet (25 mg total) by mouth daily., Disp: 90 tablet, Rfl: 3   metFORMIN  (GLUCOPHAGE ) 500 MG tablet, Take 1 tablet (500 mg total) by mouth 2 (two) times daily with a meal., Disp: 180 tablet, Rfl: 0   metoprolol  succinate (TOPROL -XL) 25 MG 24 hr tablet, Take 1 tablet (25 mg total) by mouth daily., Disp: 90 tablet, Rfl: 3   torsemide  40 MG TABS, Take 40 mg by mouth daily., Disp: , Rfl:    triamcinolone  cream (KENALOG ) 0.5 %, Apply topically to legs twice daily for up to 2 weeks as needed for eczema, Disp: 30 g, Rfl: 1   ciclopirox  (PENLAC ) 8 % solution, Apply topically at bedtime over nail and surrounding skin. Apply daily over  previous coat. After seven (7) days, remove with alcohol and continue cycle. (Patient not taking: Reported on 06/28/2024), Disp: 6.6 mL, Rfl: 2 [2]  Social History Tobacco Use  Smoking Status Every Day   Current packs/day: 0.75   Average packs/day: 0.8 packs/day for 49.0 years (36.8 ttl pk-yrs)   Types: Cigarettes   Start date: 1977  Smokeless Tobacco Current  Tobacco Comments   Less than .5pk daily 10/03/23

## 2024-07-01 ENCOUNTER — Ambulatory Visit: Payer: Self-pay

## 2024-07-01 DIAGNOSIS — I442 Atrioventricular block, complete: Secondary | ICD-10-CM

## 2024-07-02 ENCOUNTER — Ambulatory Visit (HOSPITAL_COMMUNITY): Admitting: Nurse Practitioner

## 2024-07-03 LAB — CUP PACEART REMOTE DEVICE CHECK
Battery Remaining Longevity: 96 mo
Battery Voltage: 3.01 V
Brady Statistic AS VP Percent: 0 %
Brady Statistic AS VS Percent: 0 %
Brady Statistic RV Percent Paced: 99.85 %
Date Time Interrogation Session: 20260121170556
Implantable Pulse Generator Implant Date: 20220721
Lead Channel Impedance Value: 550 Ohm
Lead Channel Pacing Threshold Amplitude: 0.25 V
Lead Channel Pacing Threshold Pulse Width: 0.24 ms
Lead Channel Sensing Intrinsic Amplitude: 28.575 mV
Lead Channel Setting Pacing Amplitude: 0.75 V
Lead Channel Setting Pacing Pulse Width: 0.24 ms
Lead Channel Setting Sensing Sensitivity: 2 mV

## 2024-07-03 NOTE — Progress Notes (Signed)
 Pulmonary Individual Treatment Plan  Patient Details  Name: Luis [name] MRN: [id number] Date of Birth: 1961/01/19 Referring Provider:   Flowsheet Row PULMONARY REHAB COPD ORIENTATION from 06/28/2024 in [facility]  Referring Provider Dr. Kassie    Initial Encounter Date:  Flowsheet Row PULMONARY REHAB COPD ORIENTATION from 06/28/2024 in [facility]  Date 06/28/24    Visit Diagnosis: Stage 2 moderate COPD by GOLD classification [facility])  Patient's Home Medications on Admission:  Current Medications[1]  Past Medical History: Past Medical History:  Diagnosis Date   Acute respiratory failure with hypoxia (HCC) 12/23/2022   Alcohol withdrawal syndrome, with delirium (HCC) 12/27/2022   COPD (chronic obstructive pulmonary disease) (HCC)    Diabetes mellitus without complication (HCC)    Diverticulitis    DVT (deep venous thrombosis) (HCC)    x3   Encephalopathy acute 12/28/2022   ETOH abuse    H/O colectomy    Hyperlipidemia    Hypertension    Sepsis associated hypotension (HCC) 08/03/2019   Smoker     Tobacco Use: Tobacco Use History[2]  Labs: Review Flowsheet  More data exists      Latest Ref Rng & Units 08/06/2023 12/13/2023 03/08/2024 04/09/2024 05/31/2024  Labs for ITP Cardiac and Pulmonary Rehab  Cholestrol 28 - 200 mg/dL - - - 843  885   LDL (calc) 10 - 99 mg/dL - - - - 23   Direct LDL mg/dL - - - 72.9  -  HDL-C >60.99 mg/dL - - - 77.69  75.99   Trlycerides 10.0 - 149.0 mg/dL - - - 157.9 Triglyceride is over 400; calculations on Lipids are invalid.  334.0   Hemoglobin A1c 4.6 - 6.5 % 7.3  - 8.4  11.1  8.7   PH, Arterial 7.35 - 7.45 - 7.28  7.26  - - -  PCO2 arterial 32 - 48 mmHg - 72  68  - - -  Bicarbonate 20.0 - 28.0 mmol/L - 33.4  30.5  - - -  O2 Saturation % - 99.4  91.7  - - -    Details       Multiple values from one day are sorted in  reverse-chronological order         Capillary Blood Glucose: Lab Results  Component Value Date   GLUCAP 165 (H) 06/06/2024   GLUCAP 132 (H) 06/06/2024   GLUCAP 114 (H) 06/06/2024   GLUCAP 137 (H) 06/06/2024   GLUCAP 132 (H) 06/05/2024     Pulmonary Assessment Scores:  Pulmonary Assessment Scores     Row Name 06/28/24 0927         ADL UCSD   SOB Score total 36       CAT Score   CAT Score 21       mMRC Score   mMRC Score 4       UCSD: Self-administered rating of dyspnea associated with activities of daily living (ADLs) 6-point scale (0 = not at all to 5 = maximal or unable to do because of breathlessness)  Scoring Scores range from 0 to 120.  Minimally important difference is 5 units  CAT: CAT can identify the health impairment of COPD patients and is better correlated with disease progression.  CAT has a scoring range of zero to 40. The CAT score is classified into four groups of low (less than 10), medium (10 - 20), high (21-30) and very  high (31-40) based on the impact level of disease on health status. A CAT score over 10 suggests significant symptoms.  A worsening CAT score could be explained by an exacerbation, poor medication adherence, poor inhaler technique, or progression of COPD or comorbid conditions.  CAT MCID is 2 points  mMRC: mMRC (Modified Medical Research Council) Dyspnea Scale is used to assess the degree of baseline functional disability in patients of respiratory disease due to dyspnea. No minimal important difference is established. A decrease in score of 1 point or greater is considered a positive change.   Pulmonary Function Assessment:  Pulmonary Function Assessment - 06/28/24 0927       Breath   Bilateral Breath Sounds Decreased    Shortness of Breath Yes;Fear of Shortness of Breath;Limiting activity          Exercise Target Goals: Exercise Program Goal: Individual exercise prescription set using results from initial 6 min walk  test and THRR while considering  patients activity barriers and safety.   Exercise Prescription Goal: Initial exercise prescription builds to 30-45 minutes a day of aerobic activity, 2-3 days per week.  Home exercise guidelines will be given to patient during program as part of exercise prescription that the participant will acknowledge.  Activity Barriers & Risk Stratification:  Activity Barriers & Cardiac Risk Stratification - 06/28/24 0925       Activity Barriers & Cardiac Risk Stratification   Activity Barriers Deconditioning;Muscular Weakness;Shortness of Breath;Back Problems;Balance Concerns;Other (comment)    Comments BLL neuropathy          6 Minute Walk:  6 Minute Walk     Row Name 06/28/24 1024         6 Minute Walk   Phase Initial     Distance 840 feet     Walk Time 6 minutes     # of Rest Breaks 0     MPH 1.59     METS 2.28     RPE 10     Perceived Dyspnea  1     VO2 Peak 7.99     Symptoms No     Resting HR 60 bpm     Resting BP 122/66     Resting Oxygen Saturation  94 %     Exercise Oxygen Saturation  during 6 min walk 90 %     Max Ex. HR 78 bpm     Max Ex. BP 132/64     2 Minute Post BP 124/66       Interval HR   1 Minute HR 70     2 Minute HR 71     3 Minute HR 75     4 Minute HR 78     5 Minute HR 76     6 Minute HR 78     2 Minute Post HR 63     Interval Heart Rate? Yes       Interval Oxygen   Interval Oxygen? Yes     Baseline Oxygen Saturation % 94 %     1 Minute Oxygen Saturation % 94 %     1 Minute Liters of Oxygen 0 L     2 Minute Oxygen Saturation % 92 %     2 Minute Liters of Oxygen 0 L     3 Minute Oxygen Saturation % 92 %     3 Minute Liters of Oxygen 0 L     4 Minute Oxygen Saturation % 90 %  4 Minute Liters of Oxygen 0 L     5 Minute Oxygen Saturation % 93 %     5 Minute Liters of Oxygen 0 L     6 Minute Oxygen Saturation % 93 %     6 Minute Liters of Oxygen 0 L     2 Minute Post Oxygen Saturation % 94 %     2 Minute  Post Liters of Oxygen 0 L        Oxygen Initial Assessment:  Oxygen Initial Assessment - 06/28/24 0927       Home Oxygen   Home Oxygen Device None    Sleep Oxygen Prescription None    Home Exercise Oxygen Prescription None    Home Resting Oxygen Prescription None      Initial 6 min Walk   Oxygen Used None      Program Oxygen Prescription   Program Oxygen Prescription None      Intervention   Short Term Goals To learn and understand importance of maintaining oxygen saturations>88%;To learn and understand importance of monitoring SPO2 with pulse oximeter and demonstrate accurate use of the pulse oximeter.;To learn and demonstrate proper pursed lip breathing techniques or other breathing techniques.     Long  Term Goals Exhibits proper breathing techniques, such as pursed lip breathing or other method taught during program session;Verbalizes importance of monitoring SPO2 with pulse oximeter and return demonstration;Compliance with respiratory medication;Maintenance of O2 saturations>88%          Oxygen Re-Evaluation:  Oxygen Re-Evaluation     Row Name 06/28/24 1125             Program Oxygen Prescription   Program Oxygen Prescription None         Home Oxygen   Home Oxygen Device None       Sleep Oxygen Prescription None       Home Exercise Oxygen Prescription None       Home Resting Oxygen Prescription None         Goals/Expected Outcomes   Short Term Goals To learn and understand importance of maintaining oxygen saturations>88%;To learn and understand importance of monitoring SPO2 with pulse oximeter and demonstrate accurate use of the pulse oximeter.;To learn and demonstrate proper pursed lip breathing techniques or other breathing techniques.        Long  Term Goals Exhibits proper breathing techniques, such as pursed lip breathing or other method taught during program session;Verbalizes importance of monitoring SPO2 with pulse oximeter and return  demonstration;Compliance with respiratory medication;Maintenance of O2 saturations>88%       Goals/Expected Outcomes Compliance and understanding of oxygen saturation monitoring and breathing techniques to decrease shortness of breath.          Oxygen Discharge (Final Oxygen Re-Evaluation):  Oxygen Re-Evaluation - 06/28/24 1125       Program Oxygen Prescription   Program Oxygen Prescription None      Home Oxygen   Home Oxygen Device None    Sleep Oxygen Prescription None    Home Exercise Oxygen Prescription None    Home Resting Oxygen Prescription None      Goals/Expected Outcomes   Short Term Goals To learn and understand importance of maintaining oxygen saturations>88%;To learn and understand importance of monitoring SPO2 with pulse oximeter and demonstrate accurate use of the pulse oximeter.;To learn and demonstrate proper pursed lip breathing techniques or other breathing techniques.     Long  Term Goals Exhibits proper breathing techniques, such as pursed lip breathing or  other method taught during program session;Verbalizes importance of monitoring SPO2 with pulse oximeter and return demonstration;Compliance with respiratory medication;Maintenance of O2 saturations>88%    Goals/Expected Outcomes Compliance and understanding of oxygen saturation monitoring and breathing techniques to decrease shortness of breath.          Initial Exercise Prescription:  Initial Exercise Prescription - 06/28/24 1000       Date of Initial Exercise RX and Referring Provider   Date 06/28/24    Referring Provider Dr. Kassie    Expected Discharge Date 08/20/24      Arm Ergometer   Level 1    Minutes 15    METs 1.8      Track   Laps 7   254ft track   Minutes 15    METs 2.08      Prescription Details   Frequency (times per week) 2    Duration Progress to 30 minutes of continuous aerobic without signs/symptoms of physical distress      Intensity   THRR 40-80% of Max Heartrate 62-125     Ratings of Perceived Exertion 11-13    Perceived Dyspnea 0-4      Progression   Progression Continue to progress workloads to maintain intensity without signs/symptoms of physical distress.      Resistance Training   Training Prescription Yes    Weight blue bands    Reps 10-15          Perform Capillary Blood Glucose checks as needed.  Exercise Prescription Changes:   Exercise Comments:   Exercise Goals and Review:   Exercise Goals     Row Name 06/28/24 0926             Exercise Goals   Increase Physical Activity Yes       Intervention Provide advice, education, support and counseling about physical activity/exercise needs.;Develop an individualized exercise prescription for aerobic and resistive training based on initial evaluation findings, risk stratification, comorbidities and participant's personal goals.       Expected Outcomes Short Term: Attend rehab on a regular basis to increase amount of physical activity.;Long Term: Exercising regularly at least 3-5 days a week.;Long Term: Add in home exercise to make exercise part of routine and to increase amount of physical activity.       Increase Strength and Stamina Yes       Intervention Provide advice, education, support and counseling about physical activity/exercise needs.;Develop an individualized exercise prescription for aerobic and resistive training based on initial evaluation findings, risk stratification, comorbidities and participant's personal goals.       Expected Outcomes Short Term: Increase workloads from initial exercise prescription for resistance, speed, and METs.;Short Term: Perform resistance training exercises routinely during rehab and add in resistance training at home;Long Term: Improve cardiorespiratory fitness, muscular endurance and strength as measured by increased METs and functional capacity ( )       Able to understand and use rate of perceived exertion (RPE) scale Yes        Intervention Provide education and explanation on how to use RPE scale       Expected Outcomes Short Term: Able to use RPE daily in rehab to express subjective intensity level;Long Term:  Able to use RPE to guide intensity level when exercising independently       Able to understand and use Dyspnea scale Yes       Intervention Provide education and explanation on how to use Dyspnea scale       Expected  Outcomes Short Term: Able to use Dyspnea scale daily in rehab to express subjective sense of shortness of breath during exertion;Long Term: Able to use Dyspnea scale to guide intensity level when exercising independently       Knowledge and understanding of Target Heart Rate Range (THRR) Yes       Intervention Provide education and explanation of THRR including how the numbers were predicted and where they are located for reference       Expected Outcomes Short Term: Able to state/look up THRR;Long Term: Able to use THRR to govern intensity when exercising independently;Short Term: Able to use daily as guideline for intensity in rehab       Understanding of Exercise Prescription Yes       Intervention Provide education, explanation, and written materials on patient's individual exercise prescription       Expected Outcomes Short Term: Able to explain program exercise prescription;Long Term: Able to explain home exercise prescription to exercise independently          Exercise Goals Re-Evaluation :  Exercise Goals Re-Evaluation     Row Name 06/28/24 1124             Exercise Goal Re-Evaluation   Exercise Goals Review Increase Physical Activity;Able to understand and use Dyspnea scale;Understanding of Exercise Prescription;Increase Strength and Stamina;Knowledge and understanding of Target Heart Rate Range (THRR);Able to understand and use rate of perceived exertion (RPE) scale       Comments Pt will begin exercise 1/22, walking the track with the [employer] and using the arm ergometer. Will  progress as tolerated.       Expected Outcomes Through exercise at rehab and home, the patient will decrease shortness of breath with daily activities and feel confident in carrying out an exercise regimen at home.          Discharge Exercise Prescription (Final Exercise Prescription Changes):   Nutrition:  Target Goals: Understanding of nutrition guidelines, daily intake of sodium 1500mg , cholesterol 200mg , calories 30% from fat and 7% or less from saturated fats, daily to have 5 or more servings of fruits and vegetables.  Biometrics:  Pre Biometrics - 06/28/24 1031       Pre Biometrics   Grip Strength 20 kg           Nutrition Therapy Plan and Nutrition Goals:   Nutrition Assessments:  MEDIFICTS Score Key: >=70 Need to make dietary changes  40-70 Heart Healthy Diet <= 40 Therapeutic Level Cholesterol Diet   Picture Your Plate Scores: <59 Unhealthy dietary pattern with much room for improvement. 41-50 Dietary pattern unlikely to meet recommendations for good health and room for improvement. 51-60 More healthful dietary pattern, with some room for improvement.  >60 Healthy dietary pattern, although there may be some specific behaviors that could be improved.    Nutrition Goals Re-Evaluation:   Nutrition Goals Discharge (Final Nutrition Goals Re-Evaluation):   Psychosocial: Target Goals: Acknowledge presence or absence of significant depression and/or stress, maximize coping skills, provide positive support system. Participant is able to verbalize types and ability to use techniques and skills needed for reducing stress and depression.  Initial Review & Psychosocial Screening:  Initial Psych Review & Screening - 06/28/24 0928       Initial Review   Current issues with Current Stress Concerns    Source of Stress Concerns Chronic Illness;Unable to perform yard/household activities;Unable to participate in former interests or hobbies;Financial    Comments Pt  stated his son moved in with  him to help take care of him but travles a lot for work and is getting ready to move to [facility] for a couple of months. States he is stressed about his health and his finances. Discussed referral to therapist and agrees.   placed referral to therapist     Family Dynamics   Good Support System? Yes    Comments Good support from son      Barriers   Psychosocial barriers to participate in program The patient should benefit from training in stress management and relaxation.;Psychosocial barriers identified (see note)      Screening Interventions   Interventions Encouraged to exercise;Provide feedback about the scores to participant;To provide support and resources with identified psychosocial needs    Expected Outcomes Short Term goal: Utilizing psychosocial counselor, staff and physician to assist with identification of specific Stressors or current issues interfering with healing process. Setting desired goal for each stressor or current issue identified.;Long Term Goal: Stressors or current issues are controlled or eliminated.;Short Term goal: Identification and review with participant of any Quality of Life or Depression concerns found by scoring the questionnaire.;Long Term goal: The participant improves quality of Life and PHQ9 Scores as seen by post scores and/or verbalization of changes          Quality of Life Scores:  Scores of 19 and below usually indicate a poorer quality of life in these areas.  A difference of  2-3 points is a clinically meaningful difference.  A difference of 2-3 points in the total score of the Quality of Life Index has been associated with significant improvement in overall quality of life, self-image, physical symptoms, and general health in studies assessing change in quality of life.  PHQ-9: Review Flowsheet  More data exists      06/28/2024 10/25/2023 10/20/2023 10/12/2023 06/15/2023  Depression screen PHQ 2/9  Decreased Interest 3 1 0 0  0  Down, Depressed, Hopeless 2 1 - 3 0  PHQ - 2 Score 5 2 0 3 0  Altered sleeping 2 1 - 2 -  Tired, decreased energy 2 1 - 2 -  Change in appetite 0 0 - 0 -  Feeling bad or failure about yourself  2 1 - 1 -  Trouble concentrating 2 1 - 1 -  Moving slowly or fidgety/restless 2 0 - 0 -  Suicidal thoughts 1 0 - 0 -  PHQ-9 Score 16 6  - 9  -  Difficult doing work/chores Somewhat difficult Somewhat difficult - Very difficult -    Details       Data saved with a previous flowsheet row definition        Interpretation of Total Score  Total Score Depression Severity:  1-4 = Minimal depression, 5-9 = Mild depression, 10-14 = Moderate depression, 15-19 = Moderately severe depression, 20-27 = Severe depression   Psychosocial Evaluation and Intervention:  Psychosocial Evaluation - 06/28/24 0929       Psychosocial Evaluation & Interventions   Interventions Stress management education;Encouraged to exercise with the program and follow exercise prescription    Comments Pt stated his son moved in with him to help take care of him but travles a lot for work and is getting ready to move to [facility] for a couple of months. States he is stressed about his health and his finances. Discussed referral to therapist and agrees.    Expected Outcomes For Rollo to attend pulmonary rehab and to have a positive outlook and good coping skills to  manage his possible depression and stress.    Continue Psychosocial Services  Follow up required by counselor          Psychosocial Re-Evaluation:  Psychosocial Re-Evaluation     Row Name 07/01/24 1512             Psychosocial Re-Evaluation   Comments 30 day psy/soc re-eval: No changes since orientation. Shaul is scheduled to start the program this week.       Expected Outcomes For Sloane to have a positive outlook and good coping skills to manage his possible depression and stress       Interventions Encouraged to attend Pulmonary Rehabilitation for the  exercise       Continue Psychosocial Services  Follow up required by staff         Initial Review   Comments --          Psychosocial Discharge (Final Psychosocial Re-Evaluation):  Psychosocial Re-Evaluation - 07/01/24 1512       Psychosocial Re-Evaluation   Comments 30 day psy/soc re-eval: No changes since orientation. Seferino is scheduled to start the program this week.    Expected Outcomes For Torrez to have a positive outlook and good coping skills to manage his possible depression and stress    Interventions Encouraged to attend Pulmonary Rehabilitation for the exercise    Continue Psychosocial Services  Follow up required by staff      Initial Review   Comments --          Education: Education Goals: Education classes will be provided on a weekly basis, covering required topics. Participant will state understanding/return demonstration of topics presented.  Learning Barriers/Preferences:  Learning Barriers/Preferences - 06/28/24 0930       Learning Barriers/Preferences   Learning Barriers Sight;Exercise Concerns    Learning Preferences None          Education Topics: Know Your Numbers Group instruction that is supported by a PowerPoint presentation. Instructor discusses importance of knowing and understanding resting, exercise, and post-exercise oxygen saturation, heart rate, and blood pressure. Oxygen saturation, heart rate, blood pressure, rating of perceived exertion, and dyspnea are reviewed along with a normal range for these values.    Exercise for the Pulmonary Patient Group instruction that is supported by a PowerPoint presentation. Instructor discusses benefits of exercise, core components of exercise, frequency, duration, and intensity of an exercise routine, importance of utilizing pulse oximetry during exercise, safety while exercising, and options of places to exercise outside of rehab.    MET Level  Group instruction provided by PowerPoint, verbal  discussion, and written material to support subject matter. Instructor reviews what METs are and how to increase METs.    Pulmonary Medications Verbally interactive group education provided by instructor with focus on inhaled medications and proper administration.   Anatomy and Physiology of the Respiratory System Group instruction provided by PowerPoint, verbal discussion, and written material to support subject matter. Instructor reviews respiratory cycle and anatomical components of the respiratory system and their functions. Instructor also reviews differences in obstructive and restrictive respiratory diseases with examples of each.  Flowsheet Row PULMONARY REHAB CHRONIC OBSTRUCTIVE PULMONARY DISEASE from 11/16/2023 in [facility]  Date 11/16/23  Educator RT  Instruction Review Code 1- Verbalizes Understanding    Oxygen Safety Group instruction provided by PowerPoint, verbal discussion, and written material to support subject matter. There is an overview of What is Oxygen and Why do we need it.  Instructor also reviews how to create a safe environment for oxygen use, the importance of using oxygen as prescribed, and the risks of noncompliance. There is a brief discussion on traveling with oxygen and resources the patient may utilize.   Oxygen Use Group instruction provided by PowerPoint, verbal discussion, and written material to discuss how supplemental oxygen is prescribed and different types of oxygen supply systems. Resources for more information are provided.    Breathing Techniques Group instruction that is supported by demonstration and informational handouts. Instructor discusses the benefits of pursed lip and diaphragmatic breathing and detailed demonstration on how to perform both.     Risk Factor Reduction Group instruction that is supported by a PowerPoint presentation. Instructor discusses the definition of a  risk factor, different risk factors for pulmonary disease, and how the heart and lungs work together.   Pulmonary Diseases Group instruction provided by PowerPoint, verbal discussion, and written material to support subject matter. Instructor gives an overview of the different type of pulmonary diseases. There is also a discussion on risk factors and symptoms as well as ways to manage the diseases.   Stress and Energy Conservation Group instruction provided by PowerPoint, verbal discussion, and written material to support subject matter. Instructor gives an overview of stress and the impact it can have on the body. Instructor also reviews ways to reduce stress. There is also a discussion on energy conservation and ways to conserve energy throughout the day.   Warning Signs and Symptoms Group instruction provided by PowerPoint, verbal discussion, and written material to support subject matter. Instructor reviews warning signs and symptoms of stroke, heart attack, cold and flu. Instructor also reviews ways to prevent the spread of infection.   Other Education Group or individual verbal, written, or video instructions that support the educational goals of the pulmonary rehab program.    Knowledge Questionnaire Score:  Knowledge Questionnaire Score - 06/28/24 0933       Knowledge Questionnaire Score   Pre Score 14/18          Core Components/Risk Factors/Patient Goals at Admission:  Personal Goals and Risk Factors at Admission - 06/28/24 0930       Core Components/Risk Factors/Patient Goals on Admission    Weight Management Yes;Obesity;Weight Loss    Intervention Weight Management: Develop a combined nutrition and exercise program designed to reach desired caloric intake, while maintaining appropriate intake of nutrient and fiber, sodium and fats, and appropriate energy expenditure required for the weight goal.;Weight Management: Provide education and appropriate resources to help  participant work on and attain dietary goals.;Weight Management/Obesity: Establish reasonable short term and long term weight goals.;Obesity: Provide education and appropriate resources to help participant work on and attain dietary goals.    Expected Outcomes Short Term: Continue to assess and modify interventions until short term weight is achieved;Long Term: Adherence to nutrition and physical activity/exercise program aimed toward attainment of established weight goal;Weight Loss: Understanding of general recommendations for a balanced deficit meal plan, which promotes 1-2 lb weight loss per week and includes a negative energy balance of [phone] kcal/d;Understanding recommendations for meals to include 15-35% energy as protein, 25-35% energy from fat, 35-60% energy from carbohydrates, less than 200mg  of dietary cholesterol, 20-35 gm of total fiber daily;Understanding of distribution of calorie intake throughout the day with the consumption of 4-5 meals/snacks    Tobacco Cessation Yes    Number of packs per day 1/2    Intervention Assist the participant in steps to quit. Provide individualized education  and counseling about committing to Tobacco Cessation, relapse prevention, and pharmacological support that can be provided by physician.;Education officer, environmental, assist with locating and accessing local/national Quit Smoking programs, and support quit date choice.    Expected Outcomes Short Term: Will demonstrate readiness to quit, by selecting a quit date.;Long Term: Complete abstinence from all tobacco products for at least 12 months from quit date.;Short Term: Will quit all tobacco product use, adhering to prevention of relapse plan.    Improve shortness of breath with ADL's Yes    Intervention Provide education, individualized exercise plan and daily activity instruction to help decrease symptoms of SOB with activities of daily living.    Expected Outcomes Short Term: Improve cardiorespiratory  fitness to achieve a reduction of symptoms when performing ADLs;Long Term: Be able to perform more ADLs without symptoms or delay the onset of symptoms          Core Components/Risk Factors/Patient Goals Review:   Goals and Risk Factor Review     Row Name 07/01/24 1514             Core Components/Risk Factors/Patient Goals Review   Personal Goals Review Weight Management/Obesity;Improve shortness of breath with ADL's;Develop more efficient breathing techniques such as purse lipped breathing and diaphragmatic breathing and practicing self-pacing with activity.;Tobacco Cessation       Review 30 day review of patient's Core Components/Risk Factors/Patient Goals are as follows: Unable to assess goals. Leodis is scheduled to start the program this week       Expected Outcomes Pt will show progress toward meeting expected goals and outcomes post pulm rehab          Core Components/Risk Factors/Patient Goals at Discharge (Final Review):   Goals and Risk Factor Review - 07/01/24 1514       Core Components/Risk Factors/Patient Goals Review   Personal Goals Review Weight Management/Obesity;Improve shortness of breath with ADL's;Develop more efficient breathing techniques such as purse lipped breathing and diaphragmatic breathing and practicing self-pacing with activity.;Tobacco Cessation    Review 30 day review of patient's Core Components/Risk Factors/Patient Goals are as follows: Unable to assess goals. [place] is scheduled to start the program this week    Expected Outcomes Pt will show progress toward meeting expected goals and outcomes post pulm rehab          ITP Comments:   Comments: Pt expected to progress toward Pulmonary Rehab goals. Recommend continued exercise, life style modification, education, and utilization of breathing techniques to increase stamina and strength, while also decreasing shortness of breath with exertion.  Dr. Slater Staff is Medical Director for Pulmonary  Rehab at [facility].       [1]  Current Outpatient Medications:    acetaminophen  (TYLENOL ) 500 MG tablet, Take 500 mg by mouth every 8 (eight) hours as needed for moderate pain (pain score 4-6)., Disp: , Rfl:    allopurinol  (ZYLOPRIM ) 100 MG tablet, Take 1 tablet (100 mg total) by mouth daily., Disp: 90 tablet, Rfl: 3   ascorbic acid  (VITAMIN C ) 500 MG tablet, Take 1 tablet (500 mg total) by mouth daily., Disp: 30 tablet, Rfl: 1   atorvastatin  (LIPITOR) 10 MG tablet, Take 1 tablet (10 mg total) by mouth daily., Disp: 90 tablet, Rfl: 3   cetirizine  (ZYRTEC ) 5 MG tablet, Take 1 tablet (5 mg total) by mouth daily., Disp: 30 tablet, Rfl: 2   Continuous Glucose Receiver (DEXCOM G7 RECEIVER) DEVI, Use as directed., Disp: 1 each, Rfl: 0   Continuous  Glucose Sensor (DEXCOM G7 SENSOR) MISC, Use as directed to monitor blood glucose, changing sensor every 10 days., Disp: 3 each, Rfl: 3   dapagliflozin  propanediol (FARXIGA ) 10 MG TABS tablet, Take 1 tablet (10 mg total) by mouth daily before breakfast., Disp: 90 tablet, Rfl: 3   folic acid  (FOLVITE ) 1 MG tablet, Take 1 tablet (1 mg total) by mouth daily., Disp: 90 tablet, Rfl: 0   gabapentin  (NEURONTIN ) 300 MG capsule, Take 2 capsules (600 mg total) by mouth 3 (three) times daily., Disp: 360 capsule, Rfl: 3   guaiFENesin  (MUCINEX ) 600 MG 12 hr tablet, Take 1 tablet (600 mg total) by mouth 2 (two) times daily as needed to loosen phlegm or cough., Disp: 180 tablet, Rfl: 1   hydrOXYzine  (ATARAX ) 25 MG tablet, Take 1 tablet (25 mg total) by mouth at bedtime as needed., Disp: 30 tablet, Rfl: 2   icosapent  Ethyl (VASCEPA ) 1 g capsule, Take 2 capsules (2 g total) by mouth 2 (two) times daily., Disp: 120 capsule, Rfl: 3   insulin  glargine (LANTUS  SOLOSTAR) 100 UNIT/ML Solostar Pen, Inject 10 Units into the skin daily., Disp: 15 mL, Rfl: 1   Insulin  Pen Needle 32G X 4 MM MISC, Use once daily to administer insulin , Disp: 100 each, Rfl: 1   losartan  (COZAAR )  25 MG tablet, Take 1 tablet (25 mg total) by mouth daily., Disp: 90 tablet, Rfl: 3   metFORMIN  (GLUCOPHAGE ) 500 MG tablet, Take 1 tablet (500 mg total) by mouth 2 (two) times daily with a meal., Disp: 180 tablet, Rfl: 0   metoprolol  succinate (TOPROL -XL) 25 MG 24 hr tablet, Take 1 tablet (25 mg total) by mouth daily., Disp: 90 tablet, Rfl: 3   torsemide  40 MG TABS, Take 40 mg by mouth daily., Disp: , Rfl:    triamcinolone  cream (KENALOG ) 0.5 %, Apply topically to legs twice daily for up to 2 weeks as needed for eczema, Disp: 30 g, Rfl: 1   ciclopirox  (PENLAC ) 8 % solution, Apply topically at bedtime over nail and surrounding skin. Apply daily over previous coat. After seven (7) days, remove with alcohol and continue cycle. (Patient not taking: Reported on 06/28/2024), Disp: 6.6 mL, Rfl: 2 [2]  Social History Tobacco Use  Smoking Status Every Day   Current packs/day: 0.75   Average packs/day: 0.8 packs/day for 49.1 years (36.8 ttl pk-yrs)   Types: Cigarettes   Start date: 1977  Smokeless Tobacco Current  Tobacco Comments   Less than .5pk daily 10/03/23

## 2024-07-04 ENCOUNTER — Ambulatory Visit: Payer: Self-pay | Admitting: Cardiology

## 2024-07-04 ENCOUNTER — Encounter (HOSPITAL_COMMUNITY)
Admission: RE | Admit: 2024-07-04 | Discharge: 2024-07-04 | Disposition: A | Discharge status: Home / Self Care | Source: Ambulatory Visit | Attending: Internal Medicine | Admitting: Internal Medicine

## 2024-07-04 DIAGNOSIS — J449 Chronic obstructive pulmonary disease, unspecified: Secondary | ICD-10-CM

## 2024-07-04 LAB — GLUCOSE, CAPILLARY
Glucose-Capillary: 127 mg/dL — ABNORMAL HIGH (ref 70–99)
Glucose-Capillary: 124 mg/dL — ABNORMAL HIGH (ref 70–99)

## 2024-07-04 NOTE — Progress Notes (Signed)
 Daily Session Note  Patient Details  Name: Luis [name] MRN: [id number] Date of Birth: 11-10-1960 Referring Provider:   Flowsheet Row PULMONARY REHAB COPD ORIENTATION from 06/28/2024 in [facility], [facility], [facility]  Referring Provider Dr. Kassie    Encounter Date: 07/04/2024  Check In:  Session Check In - 07/04/24 1341       Check-In   Supervising physician immediately available to respond to emergencies [facility] MD immediately available    Physician(s) Barnie Press, NP    Location [facility]    Staff Present Ronal Levin, RN, BSN;Randi Midge BS, ACSM-CEP, Exercise Physiologist;Natika Geyer Claudene Neita Moats, MS, ACSM-CEP, Exercise Physiologist    Virtual Visit No    Medication changes reported     No    Fall or balance concerns reported    Yes    Comments says balance is off due to neuropathy    Tobacco Cessation Use Decreased    Current number of cigarettes/nicotine  per day     0.5    Warm-up and Cool-down Performed as group-led instruction    Resistance Training Performed Yes    VAD Patient? No    PAD/SET Patient? No      Pain Assessment   Currently in Pain? No/denies    Pain Score 0-No pain    Pain Onset More than a month ago    Multiple Pain Sites No          Capillary Blood Glucose: Results for orders placed or performed during the hospital encounter of 07/04/24 (from the past 24 hours)  Glucose, capillary     Status: Abnormal   Collection Time: 07/04/24  1:18 PM  Result Value Ref Range   Glucose-Capillary 127 (H) 70 - 99 mg/dL  Glucose, capillary     Status: Abnormal   Collection Time: 07/04/24  2:54 PM  Result Value Ref Range   Glucose-Capillary 124 (H) 70 - 99 mg/dL      Tobacco Use Ypdunmb[8]  Goals Met:  Proper associated with RPD/PD & O2 Sat Independence with exercise equipment Exercise tolerated well No report of concerns or symptoms today Strength training completed today  Goals  Unmet:  Not Applicable  Comments: Service time is from 1302 to 1448.    Dr. Slater Kassie is Medical Director for Pulmonary Rehab at [facility].     [1]  Social History Tobacco Use  Smoking Status Every Day   Current packs/day: 0.75   Average packs/day: 0.8 packs/day for 49.1 years (36.8 ttl pk-yrs)   Types: Cigarettes   Start date: 1977  Smokeless Tobacco Current  Tobacco Comments   Less than .5pk daily 10/03/23

## 2024-07-05 ENCOUNTER — Ambulatory Visit (HOSPITAL_COMMUNITY)
Admission: RE | Admit: 2024-07-05 | Discharge: 2024-07-05 | Disposition: A | Source: Ambulatory Visit | Attending: Nurse Practitioner | Admitting: Nurse Practitioner

## 2024-07-05 DIAGNOSIS — L231 Allergic contact dermatitis due to adhesives: Secondary | ICD-10-CM | POA: Insufficient documentation

## 2024-07-05 DIAGNOSIS — E1142 Type 2 diabetes mellitus with diabetic polyneuropathy: Secondary | ICD-10-CM | POA: Diagnosis not present

## 2024-07-05 DIAGNOSIS — K439 Ventral hernia without obstruction or gangrene: Secondary | ICD-10-CM | POA: Insufficient documentation

## 2024-07-05 DIAGNOSIS — Z888 Allergy status to other drugs, medicaments and biological substances status: Secondary | ICD-10-CM | POA: Insufficient documentation

## 2024-07-05 DIAGNOSIS — J449 Chronic obstructive pulmonary disease, unspecified: Secondary | ICD-10-CM | POA: Insufficient documentation

## 2024-07-05 DIAGNOSIS — Y848 Other medical procedures as the cause of abnormal reaction of the patient, or of later complication, without mention of misadventure at the time of the procedure: Secondary | ICD-10-CM | POA: Diagnosis not present

## 2024-07-05 DIAGNOSIS — Z794 Long term (current) use of insulin: Secondary | ICD-10-CM | POA: Insufficient documentation

## 2024-07-05 DIAGNOSIS — Z7984 Long term (current) use of oral hypoglycemic drugs: Secondary | ICD-10-CM | POA: Insufficient documentation

## 2024-07-05 DIAGNOSIS — Z932 Ileostomy status: Secondary | ICD-10-CM | POA: Diagnosis not present

## 2024-07-05 DIAGNOSIS — Z720 Tobacco use: Secondary | ICD-10-CM | POA: Diagnosis not present

## 2024-07-05 DIAGNOSIS — T8189XA Other complications of procedures, not elsewhere classified, initial encounter: Secondary | ICD-10-CM | POA: Insufficient documentation

## 2024-07-05 NOTE — Progress Notes (Signed)
 [facility]   Reason for visit:  RLQ ileostomy with large ventral hernia, nonhealing midline surgical wound.   HPI:  Perforated colon, ileostomy, ventral hernia Past Medical History:  Diagnosis Date   Acute respiratory failure with hypoxia (HCC) 12/23/2022   Alcohol withdrawal syndrome, with delirium (HCC) 12/27/2022   COPD (chronic obstructive pulmonary disease) (HCC)    Diabetes mellitus without complication (HCC)    Diverticulitis    DVT (deep venous thrombosis) (HCC)    x3   Encephalopathy acute 12/28/2022   ETOH abuse    H/O colectomy    Hyperlipidemia    Hypertension    Sepsis associated hypotension (HCC) 08/03/2019   Smoker    Family History  Problem Relation Age of Onset   COPD Mother    Prostate cancer Father    Colon cancer Neg Hx    Rectal cancer Neg Hx    Stomach cancer Neg Hx    Esophageal cancer Neg Hx    Allergies[1] Current Outpatient Medications  Medication Sig Dispense Refill Last Dose/Taking   acetaminophen  (TYLENOL ) 500 MG tablet Take 500 mg by mouth every 8 (eight) hours as needed for moderate pain (pain score 4-6).      allopurinol  (ZYLOPRIM ) 100 MG tablet Take 1 tablet (100 mg total) by mouth daily. 90 tablet 3    ascorbic acid  (VITAMIN C ) 500 MG tablet Take 1 tablet (500 mg total) by mouth daily. 30 tablet 1    atorvastatin  (LIPITOR) 10 MG tablet Take 1 tablet (10 mg total) by mouth daily. 90 tablet 3    cetirizine  (ZYRTEC ) 5 MG tablet Take 1 tablet (5 mg total) by mouth daily. 30 tablet 2    ciclopirox  (PENLAC ) 8 % solution Apply topically at bedtime over nail and surrounding skin. Apply daily over previous coat. After seven (7) days, remove with alcohol and continue cycle. (Patient not taking: Reported on 06/28/2024) 6.6 mL 2    Continuous Glucose Receiver (DEXCOM G7 RECEIVER) DEVI Use as directed. 1 each 0    Continuous Glucose Sensor (DEXCOM G7 SENSOR) MISC Use as directed to monitor blood glucose, changing sensor every 10 days. 3  each 3    dapagliflozin  propanediol (FARXIGA ) 10 MG TABS tablet Take 1 tablet (10 mg total) by mouth daily before breakfast. 90 tablet 3    folic acid  (FOLVITE ) 1 MG tablet Take 1 tablet (1 mg total) by mouth daily. 90 tablet 0    gabapentin  (NEURONTIN ) 300 MG capsule Take 2 capsules (600 mg total) by mouth 3 (three) times daily. 360 capsule 3    guaiFENesin  (MUCINEX ) 600 MG 12 hr tablet Take 1 tablet (600 mg total) by mouth 2 (two) times daily as needed to loosen phlegm or cough. 180 tablet 1    hydrOXYzine  (ATARAX ) 25 MG tablet Take 1 tablet (25 mg total) by mouth at bedtime as needed. 30 tablet 2    icosapent  Ethyl (VASCEPA ) 1 g capsule Take 2 capsules (2 g total) by mouth 2 (two) times daily. 120 capsule 3    insulin  glargine (LANTUS  SOLOSTAR) 100 UNIT/ML Solostar Pen Inject 10 Units into the skin daily. 15 mL 1    Insulin  Pen Needle 32G X 4 MM MISC Use once daily to administer insulin  100 each 1    losartan  (COZAAR ) 25 MG tablet Take 1 tablet (25 mg total) by mouth daily. 90 tablet 3    metFORMIN  (GLUCOPHAGE ) 500 MG tablet Take 1 tablet (500 mg total) by mouth 2 (two) times daily  with a meal. 180 tablet 0    metoprolol  succinate (TOPROL -XL) 25 MG 24 hr tablet Take 1 tablet (25 mg total) by mouth daily. 90 tablet 3    torsemide  40 MG TABS Take 40 mg by mouth daily.      triamcinolone  cream (KENALOG ) 0.5 % Apply topically to legs twice daily for up to 2 weeks as needed for eczema 30 g 1    No current facility-administered medications for this encounter.   ROS  Review of Systems  HENT:  Positive for sneezing.   Gastrointestinal:        Ileostomy Ventral hernia   Endocrine:        Diabetes mellitus   Genitourinary:  Positive for frequency (on diuretics).  Musculoskeletal:  Positive for gait problem.       Weakness  Skin:  Positive for color change, rash and wound.  Neurological:  Positive for numbness.       Neuropathy in feet.   Psychiatric/Behavioral:  Positive for sleep  disturbance. The patient is nervous/anxious.   All other systems reviewed and are negative.  Vital signs:  BP (!) 153/75 (BP Location: Right Arm)   Pulse 64   Temp 97.9 F (36.6 C) (Oral)   Resp 20   SpO2 93%  Exam:  Physical Exam Constitutional:      Appearance: Normal appearance.  Cardiovascular:     Rate and Rhythm: Normal rate.  Pulmonary:     Breath sounds: Wheezing (hx COPD) present.  Abdominal:     Hernia: A hernia (large ventral hernia) is present.  Musculoskeletal:     Comments: Uses wheelchair in office  Skin:    General: Skin is warm and dry.  Neurological:     Mental Status: He is alert.  Psychiatric:        Mood and Affect: Mood normal.     Stoma type/location:  RLQ ileostomy  Stomal assessment/size:  1 flush pink and moist  Peristomal assessment:  large ventral hernia  Treatment options for stomal/peristomal skin: 1 piece flat pouch with barrier ring  Output: liquid yellow stool  Ostomy pouching: 1pc. Education provided: no changes  patient is hopeful after consulting with new surgeon.  He is working at cessation efforts for smoking and drinking alcohol.     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  Ileostomy  Discussion  Surgeon has sent abdominal binder with patient.  We have tried abdominal binder, hernia support belt made for ostomies (coloplast) and ace wraps to minimize tape and support weight of hernia.  Patient has always wanted dressing taped and supported due to odor, but today is willing to try minimal tape.  THe abdominal binder is placed and patient is immediately skeptical.  States it isnt comfortable, is not going to stay in place, and it is removed.   WOund care is performed.  Aquacel to open areas, skin is cleansed with VASHE Plan  I will order another 4XL coloplast binder that can be cut to accommodate the pouch I personally spent a total of 50 minutes in the care of the patient today  including preparing to see the patient, getting/reviewing separately obtained history, performing a medically appropriate exam/evaluation, counseling and educating, referring and communicating with other health care professionals, documenting clinical information in the EHR, communicating results, and pouch change, wound care, application of abodminal binder.     Visit time: 50 minutes.   Darice Cooley FNP-BC        [  1]  Allergies Allergen Reactions   Codeine Hives   Lisinopril Other (See Comments)    Other reaction(s): renal effects

## 2024-07-05 NOTE — Progress Notes (Signed)
 Remote PPM Transmission

## 2024-07-07 ENCOUNTER — Other Ambulatory Visit (HOSPITAL_COMMUNITY): Payer: Self-pay

## 2024-07-09 ENCOUNTER — Encounter (HOSPITAL_COMMUNITY)

## 2024-07-10 ENCOUNTER — Telehealth (HOSPITAL_COMMUNITY): Payer: Self-pay

## 2024-07-10 NOTE — Telephone Encounter (Signed)
 Returned call and LVM for Luis [name] letting him know we would be open tomorrow, 1/29, normal time

## 2024-07-11 ENCOUNTER — Telehealth (HOSPITAL_COMMUNITY): Payer: Self-pay

## 2024-07-11 ENCOUNTER — Encounter (HOSPITAL_COMMUNITY)
Admission: RE | Admit: 2024-07-11 | Discharge: 2024-07-11 | Disposition: A | Discharge status: Home / Self Care | Source: Ambulatory Visit | Attending: Internal Medicine | Admitting: Internal Medicine

## 2024-07-11 DIAGNOSIS — J449 Chronic obstructive pulmonary disease, unspecified: Secondary | ICD-10-CM

## 2024-07-11 LAB — GLUCOSE, CAPILLARY
Glucose-Capillary: 125 mg/dL — ABNORMAL HIGH (ref 70–99)
Glucose-Capillary: 119 mg/dL — ABNORMAL HIGH (ref 70–99)

## 2024-07-11 NOTE — Telephone Encounter (Signed)
 Called pt to inform him that our elevators are not working at this time. Suggested that he park on the second level and take the stairs. He stated he would try and take the stairs.

## 2024-07-11 NOTE — Progress Notes (Signed)
 Daily Session Note  Patient Details  Name: Luis [name] MRN: [id number] Date of Birth: 1960/08/14 Referring Provider:   Flowsheet Row PULMONARY REHAB COPD ORIENTATION from 06/28/2024 in [facility], V[facility], & Lung Health  Referring Provider Dr. Kassie    Encounter Date: 07/11/2024  Check In:  Session Check In - 07/11/24 1331       Check-In   Supervising physician immediately available to respond to emergencies [facility] MD immediately available    Physician(s) Josefa Beauvais, NP    [facility]    Staff Present Ronal Levin, RN, BSN;Randi Midge BS, ACSM-CEP, Exercise Physiologist;[employer] Claudene Neita Moats, MS, ACSM-CEP, Exercise Physiologist    Virtual Visit No    Medication changes reported     No    Fall or balance concerns reported    Yes    Comments says balance is off due to neuropathy    Tobacco Cessation Use Decreased    Current number of cigarettes/nicotine  per day     0.5    Warm-up and Cool-down Performed as group-led instruction    Resistance Training Performed Yes    VAD Patient? No    PAD/SET Patient? No      Pain Assessment   Currently in Pain? No/denies    Multiple Pain Sites No          Capillary Blood Glucose: Results for orders placed or performed during the hospital encounter of 07/11/24 (from the past 24 hours)  Glucose, capillary     Status: Abnormal   Collection Time: 07/11/24  1:25 PM  Result Value Ref Range   Glucose-Capillary 125 (H) 70 - 99 mg/dL  Glucose, capillary     Status: Abnormal   Collection Time: 07/11/24  2:42 PM  Result Value Ref Range   Glucose-Capillary 119 (H) 70 - 99 mg/dL      Tobacco Use Ypdunmb[8]  Goals Met:  Proper associated with RPD/PD & O2 Sat Independence with exercise equipment Exercise tolerated well No report of concerns or symptoms today Strength training completed today  Goals Unmet:  Not Applicable  Comments: Service time is from 1325 to  1444.    Dr. Slater Kassie is Medical Director for Pulmonary Rehab at [facility].     [1]  Social History Tobacco Use  Smoking Status Every Day   Current packs/day: 0.75   Average packs/day: 0.8 packs/day for 49.1 years (36.8 ttl pk-yrs)   Types: Cigarettes   Start date: 1977  Smokeless Tobacco Current  Tobacco Comments   Less than .5pk daily 10/03/23

## 2024-07-11 NOTE — Progress Notes (Signed)
 Lochlin voices that he wants to quit smoking but it is very difficult for him. He currently smokes 1 ppd. He voices that he smokes due to boredom much of the time. We discussed different methods of quitting. He sts the only method he has ever tried was cold turkey. He is thinking about discussing Chantix or Wellbutrin  with his MD. He also is contemplating the decreasing to quit method and setting an official quit date. Gave Lora resources and tips including the [employer] for virtual counseling. We will f/u with Norleen.  [id number] Aliene Aris BS, ACSM-CEP 07/11/2024 3:24 PM

## 2024-07-12 ENCOUNTER — Ambulatory Visit (HOSPITAL_COMMUNITY)
Admission: RE | Admit: 2024-07-12 | Discharge: 2024-07-12 | Disposition: A | Source: Ambulatory Visit | Attending: Nurse Practitioner | Admitting: Nurse Practitioner

## 2024-07-12 DIAGNOSIS — T8189XA Other complications of procedures, not elsewhere classified, initial encounter: Secondary | ICD-10-CM | POA: Diagnosis not present

## 2024-07-12 DIAGNOSIS — K439 Ventral hernia without obstruction or gangrene: Secondary | ICD-10-CM | POA: Insufficient documentation

## 2024-07-12 DIAGNOSIS — Z432 Encounter for attention to ileostomy: Secondary | ICD-10-CM | POA: Insufficient documentation

## 2024-07-12 DIAGNOSIS — Z932 Ileostomy status: Secondary | ICD-10-CM | POA: Diagnosis present

## 2024-07-12 DIAGNOSIS — X58XXXA Exposure to other specified factors, initial encounter: Secondary | ICD-10-CM | POA: Diagnosis not present

## 2024-07-12 NOTE — Progress Notes (Signed)
 [facility]   Reason for visit:  RLQ ileostomy.  Would like to find a hernia support belt. I have ordered a hernia support belt (4 XL) [employer] and it will not fit due to bulge of hernia.  We are going to measure for customized [employer] belt.  HPI:  Perforation with ileostomy  ventral hernia and nonhealing midline abdominal wound.  Past Medical History:  Diagnosis Date   Acute respiratory failure with hypoxia (HCC) 12/23/2022   Alcohol withdrawal syndrome, with delirium (HCC) 12/27/2022   COPD (chronic obstructive pulmonary disease) (HCC)    Diabetes mellitus without complication (HCC)    Diverticulitis    DVT (deep venous thrombosis) (HCC)    x3   Encephalopathy acute 12/28/2022   ETOH abuse    H/O colectomy    Hyperlipidemia    Hypertension    Sepsis associated hypotension (HCC) 08/03/2019   Smoker    Family History  Problem Relation Age of Onset   COPD Mother    Prostate cancer Father    Colon cancer Neg Hx    Rectal cancer Neg Hx    Stomach cancer Neg Hx    Esophageal cancer Neg Hx    Allergies[1] Current Outpatient Medications  Medication Sig Dispense Refill Last Dose/Taking   acetaminophen  (TYLENOL ) 500 MG tablet Take 500 mg by mouth every 8 (eight) hours as needed for moderate pain (pain score 4-6).      allopurinol  (ZYLOPRIM ) 100 MG tablet Take 1 tablet (100 mg total) by mouth daily. 90 tablet 3    ascorbic acid  (VITAMIN C ) 500 MG tablet Take 1 tablet (500 mg total) by mouth daily. 30 tablet 1    atorvastatin  (LIPITOR) 10 MG tablet Take 1 tablet (10 mg total) by mouth daily. 90 tablet 3    cetirizine  (ZYRTEC ) 5 MG tablet Take 1 tablet (5 mg total) by mouth daily. 30 tablet 2    ciclopirox  (PENLAC ) 8 % solution Apply topically at bedtime over nail and surrounding skin. Apply daily over previous coat. After seven (7) days, remove with alcohol and continue cycle. (Patient not taking: Reported on 06/28/2024) 6.6 mL 2    Continuous Glucose Receiver (DEXCOM G7  RECEIVER) DEVI Use as directed. 1 each 0    Continuous Glucose Sensor (DEXCOM G7 SENSOR) MISC Use as directed to monitor blood glucose, changing sensor every 10 days. 3 each 3    dapagliflozin  propanediol (FARXIGA ) 10 MG TABS tablet Take 1 tablet (10 mg total) by mouth daily before breakfast. 90 tablet 3    folic acid  (FOLVITE ) 1 MG tablet Take 1 tablet (1 mg total) by mouth daily. 90 tablet 0    gabapentin  (NEURONTIN ) 300 MG capsule Take 2 capsules (600 mg total) by mouth 3 (three) times daily. 360 capsule 3    guaiFENesin  (MUCINEX ) 600 MG 12 hr tablet Take 1 tablet (600 mg total) by mouth 2 (two) times daily as needed to loosen phlegm or cough. 180 tablet 1    hydrOXYzine  (ATARAX ) 25 MG tablet Take 1 tablet (25 mg total) by mouth at bedtime as needed. 30 tablet 2    icosapent  Ethyl (VASCEPA ) 1 g capsule Take 2 capsules (2 g total) by mouth 2 (two) times daily. 120 capsule 3    insulin  glargine (LANTUS  SOLOSTAR) 100 UNIT/ML Solostar Pen Inject 10 Units into the skin daily. 15 mL 1    Insulin  Pen Needle 32G X 4 MM MISC Use once daily to administer insulin  100 each 1  losartan  (COZAAR ) 25 MG tablet Take 1 tablet (25 mg total) by mouth daily. 90 tablet 3    metFORMIN  (GLUCOPHAGE ) 500 MG tablet Take 1 tablet (500 mg total) by mouth 2 (two) times daily with a meal. 180 tablet 0    metoprolol  succinate (TOPROL -XL) 25 MG 24 hr tablet Take 1 tablet (25 mg total) by mouth daily. 90 tablet 3    torsemide  40 MG TABS Take 40 mg by mouth daily.      triamcinolone  cream (KENALOG ) 0.5 % Apply topically to legs twice daily for up to 2 weeks as needed for eczema 30 g 1    No current facility-administered medications for this encounter.   ROS  Review of Systems  Constitutional: Negative.   Respiratory:  Positive for shortness of breath.   Cardiovascular:        History DVT Hx hypertension  Gastrointestinal:        Ileostomy Ventral hernia  Musculoskeletal:  Positive for gait problem.       Weakness  with activity   Skin:  Positive for color change, rash and wound.  Hematological:        Hx DVT  Psychiatric/Behavioral:  The patient is nervous/anxious.   All other systems reviewed and are negative.  Vital signs:  BP 116/78 (BP Location: Right Arm)   Pulse 64   Temp 97.7 F (36.5 C) (Oral)   Resp 17   SpO2 93%  Exam:  Physical Exam Vitals reviewed.  Constitutional:      Appearance: He is obese.  HENT:     Mouth/Throat:     Mouth: Mucous membranes are moist.  Cardiovascular:     Rate and Rhythm: Normal rate.  Abdominal:     Hernia: A hernia is present.  Musculoskeletal:     Comments: Weakness   Skin:    Findings: Erythema, lesion and rash present.  Neurological:     Mental Status: He is oriented to person, place, and time. Mental status is at baseline.  Psychiatric:        Behavior: Behavior normal.     Stoma type/location:  RLQ ileostomy, flush  Stomal assessment/size:  1 pink and moist  Peristomal assessment:   ventral hernia and midline nonhealing wound Treatment options for stomal/peristomal skin: stoma powder skin prep barrier ring and 1 piece flat.  Output: soft brown stool   Ostomy pouching: 1pc. flat Education provided:  Ordered hernia belt does not fit comfortably.  We complete the [employer] form for customized hernia support belt .  Will inquire if this is possible.  Asymmetry to abdomen  Medical adhesive related skin injury to abdomen from wound care.  Washing with VASHE and aquacel to open wounds.     Impression/dx  Ventral hernia Nonhealing surgical wound Ileostomy Discussion  Will submit form to [employer] and patient understands this may not be covered by insurance.  Plan  See back next week or as needed.     Visit time: 45 minutes.   Darice Cooley FNP-BC        [1]  Allergies Allergen Reactions   Codeine Hives   Lisinopril Other (See Comments)    Other reaction(s): renal effects

## 2024-07-13 ENCOUNTER — Other Ambulatory Visit (HOSPITAL_COMMUNITY): Payer: Self-pay

## 2024-07-15 ENCOUNTER — Other Ambulatory Visit: Payer: Self-pay

## 2024-07-16 ENCOUNTER — Encounter (HOSPITAL_COMMUNITY): Admission: RE | Admit: 2024-07-16 | Discharge: 2024-07-16 | Attending: Internal Medicine

## 2024-07-16 ENCOUNTER — Ambulatory Visit (HOSPITAL_COMMUNITY): Admitting: Nurse Practitioner

## 2024-07-16 VITALS — Wt 214.7 lb

## 2024-07-16 DIAGNOSIS — J449 Chronic obstructive pulmonary disease, unspecified: Secondary | ICD-10-CM

## 2024-07-18 ENCOUNTER — Encounter (HOSPITAL_COMMUNITY)

## 2024-07-19 ENCOUNTER — Ambulatory Visit (HOSPITAL_COMMUNITY): Admitting: Nurse Practitioner

## 2024-07-23 ENCOUNTER — Encounter (HOSPITAL_COMMUNITY)

## 2024-07-25 ENCOUNTER — Encounter (HOSPITAL_COMMUNITY)

## 2024-07-26 ENCOUNTER — Telehealth

## 2024-07-29 ENCOUNTER — Encounter: Admitting: Physical Medicine and Rehabilitation

## 2024-07-30 ENCOUNTER — Encounter (HOSPITAL_COMMUNITY)

## 2024-08-01 ENCOUNTER — Encounter (HOSPITAL_COMMUNITY)

## 2024-08-02 ENCOUNTER — Ambulatory Visit (HOSPITAL_COMMUNITY): Admitting: Nurse Practitioner

## 2024-08-06 ENCOUNTER — Encounter (HOSPITAL_COMMUNITY)

## 2024-08-08 ENCOUNTER — Encounter (HOSPITAL_COMMUNITY)

## 2024-08-13 ENCOUNTER — Encounter (HOSPITAL_COMMUNITY): Payer: Self-pay

## 2024-08-15 ENCOUNTER — Encounter (HOSPITAL_COMMUNITY): Payer: Self-pay

## 2024-08-20 ENCOUNTER — Encounter (HOSPITAL_COMMUNITY): Payer: Self-pay

## 2024-09-23 ENCOUNTER — Ambulatory Visit: Admitting: Podiatry

## 2024-09-30 ENCOUNTER — Ambulatory Visit: Payer: Self-pay

## 2024-12-23 ENCOUNTER — Ambulatory Visit

## 2024-12-23 ENCOUNTER — Ambulatory Visit (HOSPITAL_COMMUNITY)

## 2024-12-30 ENCOUNTER — Ambulatory Visit: Payer: Self-pay
